# Patient Record
Sex: Male | Born: 1970 | Race: White | Hispanic: No | Marital: Single | Smoking: Current every day smoker
Health system: Southern US, Community
[De-identification: ages and names within clinical notes are randomized; demographics above are authoritative.]

## PROBLEM LIST (undated history)

## (undated) ENCOUNTER — Emergency Department (HOSPITAL_COMMUNITY): Admission: EM | Payer: Medicaid Other | Source: Home / Self Care

## (undated) DIAGNOSIS — E785 Hyperlipidemia, unspecified: Secondary | ICD-10-CM

## (undated) DIAGNOSIS — J42 Unspecified chronic bronchitis: Secondary | ICD-10-CM

## (undated) DIAGNOSIS — J449 Chronic obstructive pulmonary disease, unspecified: Secondary | ICD-10-CM

## (undated) DIAGNOSIS — G43909 Migraine, unspecified, not intractable, without status migrainosus: Secondary | ICD-10-CM

## (undated) DIAGNOSIS — G4730 Sleep apnea, unspecified: Secondary | ICD-10-CM

## (undated) DIAGNOSIS — F419 Anxiety disorder, unspecified: Secondary | ICD-10-CM

## (undated) DIAGNOSIS — M1990 Unspecified osteoarthritis, unspecified site: Secondary | ICD-10-CM

## (undated) DIAGNOSIS — Z91199 Patient's noncompliance with other medical treatment and regimen due to unspecified reason: Secondary | ICD-10-CM

## (undated) DIAGNOSIS — Z8739 Personal history of other diseases of the musculoskeletal system and connective tissue: Secondary | ICD-10-CM

## (undated) DIAGNOSIS — Z9119 Patient's noncompliance with other medical treatment and regimen: Secondary | ICD-10-CM

## (undated) DIAGNOSIS — E119 Type 2 diabetes mellitus without complications: Secondary | ICD-10-CM

## (undated) DIAGNOSIS — G8929 Other chronic pain: Secondary | ICD-10-CM

## (undated) DIAGNOSIS — H9313 Tinnitus, bilateral: Secondary | ICD-10-CM

## (undated) DIAGNOSIS — Z9889 Other specified postprocedural states: Secondary | ICD-10-CM

## (undated) DIAGNOSIS — J45909 Unspecified asthma, uncomplicated: Secondary | ICD-10-CM

## (undated) DIAGNOSIS — E1142 Type 2 diabetes mellitus with diabetic polyneuropathy: Secondary | ICD-10-CM

## (undated) DIAGNOSIS — I10 Essential (primary) hypertension: Secondary | ICD-10-CM

## (undated) DIAGNOSIS — F32A Depression, unspecified: Secondary | ICD-10-CM

## (undated) DIAGNOSIS — R112 Nausea with vomiting, unspecified: Secondary | ICD-10-CM

## (undated) DIAGNOSIS — M549 Dorsalgia, unspecified: Secondary | ICD-10-CM

## (undated) DIAGNOSIS — I739 Peripheral vascular disease, unspecified: Secondary | ICD-10-CM

## (undated) DIAGNOSIS — I2510 Atherosclerotic heart disease of native coronary artery without angina pectoris: Secondary | ICD-10-CM

## (undated) DIAGNOSIS — F329 Major depressive disorder, single episode, unspecified: Secondary | ICD-10-CM

## (undated) DIAGNOSIS — K219 Gastro-esophageal reflux disease without esophagitis: Secondary | ICD-10-CM

## (undated) HISTORY — DX: Gastro-esophageal reflux disease without esophagitis: K21.9

## (undated) HISTORY — DX: Type 2 diabetes mellitus without complications: E11.9

## (undated) HISTORY — DX: Hyperlipidemia, unspecified: E78.5

## (undated) HISTORY — DX: Major depressive disorder, single episode, unspecified: F32.9

## (undated) HISTORY — PX: COLONOSCOPY WITH ESOPHAGOGASTRODUODENOSCOPY (EGD): SHX5779

## (undated) HISTORY — DX: Peripheral vascular disease, unspecified: I73.9

## (undated) HISTORY — DX: Depression, unspecified: F32.A

## (undated) HISTORY — PX: TENDON REPAIR: SHX5111

## (undated) HISTORY — DX: Anxiety disorder, unspecified: F41.9

## (undated) HISTORY — DX: Essential (primary) hypertension: I10

## (undated) HISTORY — DX: Sleep apnea, unspecified: G47.30

## (undated) HISTORY — PX: FRACTURE SURGERY: SHX138

## (undated) HISTORY — PX: CARPAL TUNNEL RELEASE: SHX101

## (undated) HISTORY — PX: ELBOW FRACTURE SURGERY: SHX616

## (undated) HISTORY — DX: Atherosclerotic heart disease of native coronary artery without angina pectoris: I25.10

---
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
## 2001-07-14 ENCOUNTER — Inpatient Hospital Stay (HOSPITAL_COMMUNITY): Admission: EM | Admit: 2001-07-14 | Discharge: 2001-07-17 | Payer: Self-pay | Admitting: Orthopedic Surgery

## 2004-03-26 ENCOUNTER — Encounter: Admission: RE | Admit: 2004-03-26 | Discharge: 2004-03-26 | Payer: Self-pay | Admitting: Family Medicine

## 2004-05-21 ENCOUNTER — Encounter: Admission: RE | Admit: 2004-05-21 | Discharge: 2004-05-21 | Payer: Self-pay | Admitting: Family Medicine

## 2006-08-02 ENCOUNTER — Emergency Department (HOSPITAL_COMMUNITY): Admission: EM | Admit: 2006-08-02 | Discharge: 2006-08-02 | Payer: Self-pay | Admitting: Family Medicine

## 2014-05-31 DIAGNOSIS — G4730 Sleep apnea, unspecified: Secondary | ICD-10-CM

## 2014-05-31 HISTORY — DX: Sleep apnea, unspecified: G47.30

## 2014-07-29 ENCOUNTER — Other Ambulatory Visit: Payer: Self-pay | Admitting: Cardiology

## 2014-07-29 ENCOUNTER — Ambulatory Visit
Admission: RE | Admit: 2014-07-29 | Discharge: 2014-07-29 | Disposition: A | Discharge status: Home / Self Care | Payer: 59 | Source: Ambulatory Visit | Attending: Cardiology | Admitting: Cardiology

## 2014-07-29 DIAGNOSIS — R0602 Shortness of breath: Secondary | ICD-10-CM

## 2014-08-27 ENCOUNTER — Encounter: Payer: Self-pay | Admitting: Neurology

## 2014-09-03 ENCOUNTER — Encounter: Payer: Self-pay | Admitting: Neurology

## 2014-09-03 ENCOUNTER — Ambulatory Visit (INDEPENDENT_AMBULATORY_CARE_PROVIDER_SITE_OTHER): Payer: 59 | Admitting: Neurology

## 2014-09-03 VITALS — BP 115/77 | HR 83 | Temp 97.9°F | Resp 16 | Ht 68.0 in | Wt 183.0 lb

## 2014-09-03 DIAGNOSIS — R0683 Snoring: Secondary | ICD-10-CM | POA: Diagnosis not present

## 2014-09-03 DIAGNOSIS — G2581 Restless legs syndrome: Secondary | ICD-10-CM

## 2014-09-03 DIAGNOSIS — Z720 Tobacco use: Secondary | ICD-10-CM | POA: Diagnosis not present

## 2014-09-03 DIAGNOSIS — R51 Headache: Secondary | ICD-10-CM

## 2014-09-03 DIAGNOSIS — G478 Other sleep disorders: Secondary | ICD-10-CM

## 2014-09-03 DIAGNOSIS — R519 Headache, unspecified: Secondary | ICD-10-CM

## 2014-09-03 DIAGNOSIS — R351 Nocturia: Secondary | ICD-10-CM | POA: Diagnosis not present

## 2014-09-03 DIAGNOSIS — E0842 Diabetes mellitus due to underlying condition with diabetic polyneuropathy: Secondary | ICD-10-CM | POA: Diagnosis not present

## 2014-09-03 DIAGNOSIS — F515 Nightmare disorder: Secondary | ICD-10-CM

## 2014-09-03 DIAGNOSIS — F17200 Nicotine dependence, unspecified, uncomplicated: Secondary | ICD-10-CM

## 2014-09-03 DIAGNOSIS — G4734 Idiopathic sleep related nonobstructive alveolar hypoventilation: Secondary | ICD-10-CM

## 2014-09-03 DIAGNOSIS — F513 Sleepwalking [somnambulism]: Secondary | ICD-10-CM

## 2014-09-03 NOTE — Patient Instructions (Addendum)
Based on your symptoms and your exam I believe you are at risk for obstructive sleep apnea or OSA, and I think we should proceed with a sleep study to determine whether you do or do not have OSA and how severe it is. If you have more than mild OSA, I want you to consider treatment with CPAP. Please remember, the risks and ramifications of moderate to severe obstructive sleep apnea or OSA are: Cardiovascular disease, including congestive heart failure, stroke, difficult to control hypertension, arrhythmias, and even type 2 diabetes has been linked to untreated OSA. Sleep apnea causes disruption of sleep and sleep deprivation in most cases, which, in turn, can cause recurrent headaches, problems with memory, mood, concentration, focus, and vigilance. Most people with untreated sleep apnea report excessive daytime sleepiness, which can affect their ability to drive. Please do not drive if you feel sleepy.  I will see you back after your sleep study to go over the test results and where to go from there. We will call you after your sleep study and to set up an appointment at the time.   Please remember to try to maintain good sleep hygiene, which means: Keep a regular sleep and wake schedule, try not to exercise or have a meal within 2 hours of your bedtime, try to keep your bedroom conducive for sleep, that is, cool and dark, without light distractors such as an illuminated alarm clock, and refrain from watching TV right before sleep or in the middle of the night and do not keep the TV or radio on during the night. Also, try not to use or play on electronic devices at bedtime, such as your cell phone, tablet PC or laptop. If you like to read at bedtime on an electronic device, try to dim the background light as much as possible. Do not eat in the middle of the night.     

## 2014-09-03 NOTE — Progress Notes (Addendum)
Subjective:    Patient ID: [name] [name] is a 44 y.o. male.  HPI     [name] Foley, MD, PhD [facility] [address], [address] [address] [place], [place] [address]  Dear [name] [name],   I saw your patient, [name] [name], upon your kind request in my neurologic clinic today for initial consultation of his sleep disorder, in particular, concern for underlying obstructive sleep apnea. As you know, [name] [name] is a 45 year old right-handed gentleman with an underlying medical history of hypertension, smoking, hyperlipidemia, poorly controlled type 2 diabetes, depression and atypical chest pain, as well as overweight state, who reports snoring and excessive daytime somnolence. She had a pulse oximetry test overnight on 07/11/2014 which I reviewed: Average oxygen saturation was 92.5%, lowest was 81%, time below 88% saturation was 6.1 minutes. Time below 89% saturation was 15.4 minutes. Total test time was 10 hours and 43 minutes.  I reviewed his recent blood work and test results from your office: on 07/05/14: LDL was elevated at 150, total cholesterol was 220.  He had a Lexiscan stress test on 07/12/14, which I reviewed: NSR, RBBB, negative for ischemia.  SPECT imaging showed normal EF. HbA1c on 06/05/14: 14.3%.  Echocardiogram on 07/09/14: LV normal in size, mild concentric LVH, normal global wall motion. Calculated EF: 58%. Trace MR, trace TR.   His typical bedtime is reported to be around 10 PM and usual wake time is around 5:30 AM. Sleep onset typically occurs within 30-40 minutes. He reports feeling poorly rested upon awakening. He wakes up on an average 3 to 4 times in the middle of the night and has to go to the bathroom 1 times on a typical night. He reports occasional morning headaches.  He reports excessive daytime somnolence (EDS) and His Epworth Sleepiness Score (ESS) is 5/24 today. He has not fallen asleep while driving. The patient has not been taking a planned nap.  He has  been known to snore for the past few years. Snoring is reportedly mild to moderate, and it is unclear if it is associated with choking sounds or witnessed apneas. He is single, lives with his mother and sleeps alone, except for the dog in the bed. The patient admits to a sense of choking or strangling feeling occasionally. There some report of nighttime reflux, with no nighttime cough experienced. The patient has noted some RLS symptoms and is a restless sleeper, admits to tossing and turning, having nightmares and occasional sleep walking and sleep talking. There is no family history of RLS but sister had OSA on CPAP and died at age 51 from CHF.  He denies cataplexy, sleep paralysis, hypnagogic or hypnopompic hallucinations, or sleep attacks. He does report vivid dreams, nightmares, no dream enactments, but has occasional sleep talking and sleep walking. The patient has not had a sleep study or a home sleep test.  He consumes 4 caffeinated beverages per day, usually in the form of soda: [facility].   His bedroom is usually dark and cool. There is a TV in the bedroom and usually it is not on at night.   His Past Medical History Is Significant For: Past Medical History  Diagnosis Date  . Malaise and fatigue   . Hypertension   . Atypical chest pain   . Hyperlipemia   . Diabetes mellitus without complication     His Past Surgical History Is Significant For: Past Surgical History  Procedure Laterality Date  . Appendectomy  1975  . Ankle surgery  1982    His Family History Is Significant For: Family History  Problem Relation Age of Onset  . Congestive Heart Failure Sister     His Social History Is Significant For: History   Social History  . Marital Status: Single    Spouse Name: N/A  . Number of Children: 0  . Years of Education: 10th g   Occupational History  . [employer]    Social History Main Topics  . Smoking status: Current Every Day Smoker -- 1.00 packs/day for  29 years    Types: Cigarettes  . Smokeless tobacco: Not on file  . Alcohol Use: No  . Drug Use: No  . Sexual Activity: Not on file   Other Topics Concern  . None   Social History Narrative    His Allergies Are:  No Known Allergies:   His Current Medications Are:  Outpatient Encounter Prescriptions as of 09/03/2014  Medication Sig  . acetaminophen (TYLENOL) 500 MG tablet Take 500 mg by mouth every 6 (six) hours as needed.  Marland Kitchen. aspirin 81 MG chewable tablet Chew by mouth daily.  Marland Kitchen. atorvastatin (LIPITOR) 40 MG tablet   . insulin detemir (LEVEMIR) 100 UNIT/ML injection Inject into the skin at bedtime.  . metFORMIN (GLUCOPHAGE) 500 MG tablet Take 500 mg by mouth 2 (two) times daily with a meal.  . metoprolol succinate (TOPROL-XL) 25 MG 24 hr tablet Take 25 mg by mouth daily.  . sertraline (ZOLOFT) 50 MG tablet Take 50 mg by mouth at bedtime.  . valsartan-hydrochlorothiazide (DIOVAN-HCT) 160-12.5 MG per tablet Take 1 tablet by mouth daily.  . [DISCONTINUED] atorvastatin (LIPITOR) 10 MG tablet Take 10 mg by mouth daily.  :  Review of Systems:  Out of a complete 14 point review of systems, all are reviewed and negative with the exception of these symptoms as listed below:   Review of Systems  Neurological:       Wakes up many times during night, Nightmares, Sometimes wakes up tired, does not take naps during day, Denies waking up gasping for air during night.      Objective:  Neurologic Exam  Physical Exam Physical Examination:   Filed Vitals:   09/03/14 0904  BP: 115/77  Pulse: 83  Temp: 97.9 F (36.6 C)  Resp: 16    General Examination: The patient is a very pleasant 44 y.o. male in no acute distress. He appears well-developed and well-nourished and adequately groomed.   HEENT: Normocephalic, atraumatic, pupils are equal, round and reactive to light and accommodation. Funduscopic exam is normal with sharp disc margins noted. Extraocular tracking is good without limitation  to gaze excursion or nystagmus noted. Normal smooth pursuit is noted. Hearing is grossly intact. Tympanic membranes are clear bilaterally. Face is symmetric with normal facial animation and normal facial sensation. Speech is clear with no dysarthria noted. There is no hypophonia. There is no lip, neck/head, jaw or voice tremor. Neck is supple with full range of passive and active motion. There are no carotid bruits on auscultation. Oropharynx exam reveals: moderate mouth dryness, poor dental hygiene and moderate airway crowding, due to smaller airway entry and wider uvula. Mallampati is class II. Tongue protrudes centrally and palate elevates symmetrically. Tonsils are 1+/2+ in size/absent. Neck size is 17.5 inches. He has a Absent overbite. Nasal inspection reveals no significant nasal mucosal bogginess or redness and no septal deviation.   Chest: Clear to auscultation without wheezing, rhonchi or crackles noted.  Heart: S1+S2+0, regular and  normal without murmurs, rubs or gallops noted.   Abdomen: Soft, non-tender and non-distended with normal bowel sounds appreciated on auscultation.  Extremities: There is no pitting edema in the distal lower extremities bilaterally. Pedal pulses are intact.  Skin: Warm and dry without trophic changes noted. There are no varicose veins.  Musculoskeletal: exam reveals no obvious joint deformities, tenderness or joint swelling or erythema.   Neurologically:  Mental status: The patient is awake, alert and oriented in all 4 spheres. His immediate and remote memory, attention, language skills and fund of knowledge are appropriate. There is no evidence of aphasia, agnosia, apraxia or anomia. Speech is clear with normal prosody and enunciation. Thought process is linear. Mood is normal and affect is normal.  Cranial nerves II - XII are as described above under HEENT exam. In addition: shoulder shrug is normal with equal shoulder height noted. Motor exam: Normal bulk,  strength and tone is noted. There is no drift, tremor or rebound. Romberg is negative. Reflexes are 2+ throughout. Babinski: Toes are flexor bilaterally. Fine motor skills and coordination: intact with normal finger taps, normal hand movements, normal rapid alternating patting, normal foot taps and normal foot agility.  Cerebellar testing: No dysmetria or intention tremor on finger to nose testing. Heel to shin is unremarkable bilaterally. There is no truncal or gait ataxia.  Sensory exam: intact to light touch, pinprick, vibration, temperature sense in the upper and decrease to all modalities in the distal lower extremities.  Gait, station and balance: He stands easily. No veering to one side is noted. No leaning to one side is noted. Posture is age-appropriate and stance is narrow based. Gait shows normal stride length and normal pace. No problems turning are noted. He turns en bloc. Tandem walk is unremarkable.    Assessment and Plan:   In summary, [name] [name] is a very pleasant 44 y.o.-year old male with an underlying medical history of hypertension, smoking, hyperlipidemia, poorly controlled type 2 diabetes, depression and atypical chest pain, as well as overweight state, who reports snoring and excessive daytime somnolence, multiple nighttime awakenings and recent evidence of nocturnal hypoxemia his history and physical exam are indeed concerning for underlying obstructive sleep apnea (OSA). I had a long chat with the patient about my findings and the diagnosis of OSA, its prognosis and treatment options. We talked about medical treatments, surgical interventions and non-pharmacological approaches. I explained in particular the risks and ramifications of untreated moderate to severe OSA, especially with respect to developing cardiovascular disease down the Road, including congestive heart failure, difficult to treat hypertension, cardiac arrhythmias, or stroke. Even type 2 diabetes has, in part,  been linked to untreated OSA. Symptoms of untreated OSA include daytime sleepiness, memory problems, mood irritability and mood disorder such as depression and anxiety, lack of energy, as well as recurrent headaches, especially morning headaches. We talked about smoking cessation and trying to maintain a healthy lifestyle in general, as well as the importance of weight control. I encouraged the patient to eat healthy, exercise daily and keep well hydrated, to keep a scheduled bedtime and wake time routine, to not skip any meals and eat healthy snacks in between meals. I advised the patient not to drive when feeling sleepy. I recommended the following at this time: sleep study with potential positive airway pressure titration. (We will score hypopneas at 4% and split the sleep study into diagnostic and treatment portion, if the estimated. 2 hour AHI is >20/h).   On exam, he has  evidence of diabetic neuropathy and we talked about the importance of good glucose control. In addition, he reports some RLS symptoms and is a restless sleeper and we will be on the look out for PLMS.  I explained the sleep test procedure to the patient and also outlined possible surgical and non-surgical treatment options of OSA, including the use of a custom-made dental device (which would require a referral to a specialist dentist or oral surgeon), upper airway surgical options, such as pillar implants, radiofrequency surgery, tongue base surgery, and UPPP (which would involve a referral to an ENT surgeon). Rarely, jaw surgery such as mandibular advancement may be considered.  I also explained the CPAP treatment option to the patient, who indicated that he would be willing to try CPAP if the need arises. I explained the importance of being compliant with PAP treatment, not only for insurance purposes but primarily to improve His symptoms, and for the patient's long term health benefit, including to reduce His cardiovascular risks. I  answered all his questions today and the patient was in agreement. I would like to see him back after the sleep study is completed and encouraged him to call with any interim questions, concerns, problems or updates.   Thank you very much for allowing me to participate in the care of this nice patient. If I can be of any further assistance to you please do not hesitate to call me at [phone].  Sincerely,   [name] Foley, MD, PhD

## 2014-09-10 ENCOUNTER — Ambulatory Visit (INDEPENDENT_AMBULATORY_CARE_PROVIDER_SITE_OTHER): Payer: 59 | Admitting: Neurology

## 2014-09-10 DIAGNOSIS — G4761 Periodic limb movement disorder: Secondary | ICD-10-CM

## 2014-09-10 DIAGNOSIS — G4734 Idiopathic sleep related nonobstructive alveolar hypoventilation: Secondary | ICD-10-CM

## 2014-09-10 DIAGNOSIS — G4720 Circadian rhythm sleep disorder, unspecified type: Secondary | ICD-10-CM

## 2014-09-10 DIAGNOSIS — G479 Sleep disorder, unspecified: Secondary | ICD-10-CM

## 2014-09-10 DIAGNOSIS — G4733 Obstructive sleep apnea (adult) (pediatric): Secondary | ICD-10-CM

## 2014-09-10 NOTE — Sleep Study (Signed)
Please see the scanned sleep study interpretation located in the Procedure tab within the Chart Review section. 

## 2014-09-20 ENCOUNTER — Telehealth: Payer: Self-pay | Admitting: Neurology

## 2014-09-20 DIAGNOSIS — G4733 Obstructive sleep apnea (adult) (pediatric): Secondary | ICD-10-CM

## 2014-09-20 NOTE — Telephone Encounter (Signed)
Please call and notify the patient that the recent sleep study did confirm the diagnosis of obstructive sleep apnea and that I recommend treatment for this in the form of CPAP. This will require a repeat sleep study for proper titration and mask fitting and monitoring oxygen levels etc. Please explain to patient and arrange for a CPAP titration study. I have placed an order in the chart. If patient agreeable, pls route to Darrol Jumpavid and Alycia for scheduling/auth process, etc. thx Onalee Huaavid or Leanord Hawkinglycia can close the encounter.   Huston FoleySaima Kumiko Fishman, MD, PhD [facility])

## 2014-09-23 ENCOUNTER — Encounter: Payer: Self-pay | Admitting: Neurology

## 2014-09-24 NOTE — Telephone Encounter (Signed)
Left message on home phone to call back for sleep study results. Work number is to a call center, unable to speak with patient.

## 2014-09-25 NOTE — Telephone Encounter (Signed)
Left message to call back  

## 2014-09-25 NOTE — Telephone Encounter (Signed)
Pt is returning Diana's call. Please call [phone].

## 2014-09-30 ENCOUNTER — Ambulatory Visit (INDEPENDENT_AMBULATORY_CARE_PROVIDER_SITE_OTHER): Payer: 59 | Admitting: Neurology

## 2014-09-30 VITALS — BP 161/99 | HR 92 | Ht 68.0 in | Wt 183.0 lb

## 2014-09-30 DIAGNOSIS — G4720 Circadian rhythm sleep disorder, unspecified type: Secondary | ICD-10-CM

## 2014-09-30 DIAGNOSIS — G4733 Obstructive sleep apnea (adult) (pediatric): Secondary | ICD-10-CM | POA: Diagnosis not present

## 2014-09-30 DIAGNOSIS — G4761 Periodic limb movement disorder: Secondary | ICD-10-CM

## 2014-09-30 DIAGNOSIS — G479 Sleep disorder, unspecified: Secondary | ICD-10-CM

## 2014-09-30 NOTE — Sleep Study (Signed)
See scanned documents in Encounters tab 

## 2014-10-07 NOTE — Telephone Encounter (Signed)
Left message to call back for test results.

## 2014-10-08 NOTE — Telephone Encounter (Signed)
Patient is aware of results below and has had the CPAP titration study. He would like to proceed with getting CPAP at home. Please send this back to me when orders from titration study have been placed. I [facility] then [facility] [place] [facility] [place]) and study to referring doctor.

## 2014-10-14 ENCOUNTER — Telehealth: Payer: Self-pay | Admitting: Neurology

## 2014-10-14 DIAGNOSIS — G4733 Obstructive sleep apnea (adult) (pediatric): Secondary | ICD-10-CM

## 2014-10-14 NOTE — Telephone Encounter (Signed)
Lafonda MossesDiana:  Please call and inform patient that I have entered an order for treatment with PAP. He did well during the latest sleep study with CPAP. We will, therefore, arrange for a machine for home use through a DME (durable medical equipment) company of His choice (Dr. Jacinto HalimGanji prefers his patient to go to [facility], if possible, ins: [employer]).  I will see the patient back in follow-up in about 6 weeks. Please also explain to the patient that I will be looking out for compliance data downloaded from the machine, which can be done remotely through a modem at times or stored on an SD card in the back of the machine. At the time of the followup appointment we will discuss sleep study results and how it is going with PAP treatment at home. Please advise patient to bring His machine at the time of the visit; at least for the first visit, even though this is cumbersome. Bringing the machine for every visit after that may not be needed, but often helps for the first visit. Please also make sure, the patient has a follow-up appointment with me in about 8-10 weeks from the setup date, thanks.   Huston FoleySaima Tephanie Escorcia, MD, PhD [facility]

## 2014-10-15 ENCOUNTER — Telehealth: Payer: Self-pay | Admitting: Neurology

## 2014-10-15 NOTE — Telephone Encounter (Signed)
Mandy with [facility] is calling to let you know she did get the order for the cpap for this patient and is processing.

## 2014-10-15 NOTE — Telephone Encounter (Signed)
I left a message for [facility] to call back regarding CPAP referral. I also left message on patient's home phone letting him know that we sent referral in for him. I also reminded him that he needs to call us for appt. I will send reminder letter to him today.

## 2014-10-24 ENCOUNTER — Telehealth: Payer: Self-pay | Admitting: Neurology

## 2014-10-24 NOTE — Telephone Encounter (Signed)
Please call patient and offer him a follow-up appointment in sleep clinic to discuss sleep apnea treatment. As I understand he declined CPAP therapy.

## 2014-10-24 NOTE — Telephone Encounter (Signed)
Left message for patient to call back and schedule f/u visit with Dr. Frances FurbishAthar to discuss PSG and treatment options.

## 2015-02-27 DIAGNOSIS — F17200 Nicotine dependence, unspecified, uncomplicated: Secondary | ICD-10-CM | POA: Insufficient documentation

## 2015-02-27 DIAGNOSIS — G4733 Obstructive sleep apnea (adult) (pediatric): Secondary | ICD-10-CM | POA: Insufficient documentation

## 2015-02-27 DIAGNOSIS — E669 Obesity, unspecified: Secondary | ICD-10-CM | POA: Insufficient documentation

## 2015-02-27 DIAGNOSIS — F419 Anxiety disorder, unspecified: Secondary | ICD-10-CM

## 2015-03-27 DIAGNOSIS — R079 Chest pain, unspecified: Secondary | ICD-10-CM

## 2015-03-27 NOTE — H&P (Signed)
OFFICE VISIT NOTES COPIED TO EPIC FOR DOCUMENTATION  [name] [name] 03/07/2015 1:45 PM Location: [facility] Patient #: [id number] DOB: [birth year] Single / Language: [place] / Race: White Male   History of Present Illness [name]; 03/08/2015 12:09 PM) Patient words: 6 week F/U for tachycardia.  The patient is a 44 year old male who presents for a follow-up for Chest pain. Patient being seen for follow-up and management of chest pain and hyperlipidemia. He has hypertension, uncontrolled hyperlipidemia and uncontrolled diabetes mellitus and tobacco use disorder. He has occasional chest tightness with exertion activity but also states that he has chest pain when he gets upset. He has not used any sublingual nitroglycerin. Burning sensation in the chest on exertion with radiation to his neck had improved since being on medical therapy, however patient states that over the past few weeks his symptoms have gotten worse, and he is now being referred for GI evaluation. I'm seeing him frequently to improve compliance.  He is tolerating Lipitor and also valsartan, since then his blood pressure has been well-controlled. Pt smokes 1 pack a day with a 29 year pack history. No claudication although has leg cramps at night. He denies symptoms suggestive of TIA, dizziness or syncope, no leg edema.   Problem List/Past Medical [name]; 03/07/2015 1:45 PM) Angina pectoris (I20.9) Lexiscan sestamibi stress test 07/12/2014: 1. The resting electrocardiogram demonstrated normal sinus rhythm, RBBB and no resting arrhythmias. Stress EKG is nondiagnostic for ischemia as it is a Pharmacologic stress test using Lexiscan infusion. Stress symptoms included dyspnea, dizziness. 2. Gated SPECT imaging reveals normal myocardial thickening and wall motion. The left ventricular ejection fraction was normal visually, but calculated by QGS to be(41%). The right ventricle is abnormal and appears  dilated. This is a low risk stress test. Uncontrolled type 2 diabetes mellitus without complication, with long-term current use of insulin (E11.65) Labs 06/05/2014: HbA1c 14.3%, serum glucose 423, creatinine 0.95, CMP otherwise normal, CBC normal ABI 07/18/2014: This exam reveals normal perfusion of the right lower extremity. This exam reveals normal perfusion of the left lower extremity. Normal bilateral ABI with triphasic waveforms at the level of the ankles. Right ABI 1.01, left ABI 0.97 Benign essential hypertension (I10) Malaise and fatigue (R53.81, R53.83) Sleep study 09/10/2014: Mild to moderate obstructive sleep apnea. Schedule for repeat sleep study with PAP titration. Office visit 09/03/2014 Frances Furbish, M.D.) schedule for sleep study with potential positive airway pressure titration Nocturnal oximetry 07/11/2014: Time < 88%: 6.1 minutes, time < 89%: 15.4 minutes, low SpO2 81%, total desaturation events: 226, average events per hour: 20. This patient qualifies for nocturnal oxygen per Medicare guidelines Group I. 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) Labs 01/22/2015: Total cholesterol 163, triglycerides 254, HDL 29, LDL 83, LDL particle #1304, LP-IR score 69, serum glucose 333, creatinine 0.93, potassium 4.7, CMP otherwise normal Labs 10/23/2014: Total cholesterol 161, triglycerides 260, HDL 33, LDL 76, LDL particle # 1335, LP-IR score 77, serum glucose 424, creatinine 1.32, sodium 130, potassium 5.3, chloride 90, CMP otherwise normal Labs(VAP panel) 07/05/2014: Total cholesterol 220, triglycerides 183, HDL 38, LDL (direct) 150, Lp(a) 0.0, LDL pattern B (small, dense LDL) Shortness of breath on exertion (R06.02) Echo- 07/09/14: Left ventricle cavity is normal in size. Mild concentric hypertrophy of the left ventricle. Normal global wall motion. Calculated EF 58%. Trace mitral regurgitation. Trace tricuspid regurgitation. Chest x-ray 07/29/2014:  Normal chest x-ray PA and lateral view. Gastroesophageal reflux disease without  esophagitis (K21.9)  Allergies (Charavina Reader; 03/07/2015 1:52 PM) No Known Drug Allergies10/11/2014 (Marked as Inactive) Esomeprazole Magnesium *ULCER DRUGS* Swelling.  Family History [name]; 03/07/2015 1:45 PM) Mother Living; no known heart conditions Father Living; no known heart conditions Sister 1 Deceased. at age 20 from CHF Brother 2 1-1 Yr Older; 1-4 Yrs Older; no known heart conditions  Social History (Charavina Reader; 03/07/2015 1:45 PM) Current tobacco use Current every day smoker. 1 PPD since he was 14. 29 pack year history. (down to 1/2 - 1 PPD) Marital status Single. Non Drinker/No Alcohol Use Number of Children 0. Living Situation Lives with parents.  Past Surgical History [name]; 03/07/2015 1:45 PM) Appendectomy1975 Ankle [phone] Right. 2010 Left Ankle Carpal Tunnel Surgery - Left2010  Medication History (Charavina Reader; 03/07/2015 1:56 PM) Glimepiride (  Tablet, 1 (one) Tablet Oral every morning, Taken starting 01/24/2015) Active. Metoprolol Succinate ER (  Tablet ER 24HR, 1 Tablet ER 24HR Oral daily, Taken starting 01/24/2015) Active. (Changed to 50 mg daily) Valsartan (  Tablet, 1 (one) Tablet Oral daily, Taken starting 01/24/2015) Active. (Discontinue Valsartan HCT) Atorvastatin Calcium (  Tablet, 1 (one) Tablet Tablet Tablet Oral daily after a meal, Taken starting 10/29/2014) Active. Nitrostat (0.4MG  Tab Sublingual, 1 (one) Tab Sublingual Tab Sub Sublingual every 5 minutes as needed for chest pain., Taken starting 10/29/2014) Active. Aspirin Adult Low Strength (  Tablet Chewable, 1 (one) Tablet Chewable Tablet Oral daily, Taken starting 07/05/2014) Active. MetFORMIN HCl (  Tablet, 1 Oral two times daily) Active. Sertraline HCl (  Tablet, 1 Oral at bedtime) Active. Levemir FlexTouch (100UNIT/ML Soln Pen-inj, 12  units Subcutaneous daily) Active. Tylenol Extra Strength (  Tablet, 1 Oral as needed) Active. ALPRAZolam (0.5MG  Tablet, 1/2 to 1 tablet Oral daily) Active. Glyxambi (10-5MG  Tablet, 1 Oral at bedtime) Active. Medications Reconciled  Diagnostic Studies History [name]; 03/07/2015 1:45 PM) Echocardiogram02/01/2015 1. Left ventricle cavity is normal in size. Mild concentric hypertrophy of the left ventricle. Normal global wall motion. Calculated EF 58%. 2. Trace mitral regurgitation. 3. Trace tricuspid regurgitation. Nuclear stress test02/05/2015 1. The resting electrocardiogram demonstrated normal sinus rhythm, RBBB and no resting arrhythmias. Stress EKG is nondiagnostic for ischemia as it is a Pharmacologic stress test using Lexiscan infusion. Stress symptoms included dyspnea, dizziness. 2. Gated SPECT imaging reveals normal myocardial thickening and wall motion. The left ventricular ejection fraction was normal visually, but calculated by QGS to be(41%). The right ventricle is abnormal and appears dilated. This is a low risk stress test. ABI's02/18/2016 This exam reveals normal perfusion of the right lower extremity. This exam reveals normal perfusion of the left lower extremity. Normal bilateral ABI with triphasic waveforms at the level of the ankles. Right ABI 1.01, left ABI 0.97 Chest X-ray02/29/2016 Sleep Study04/05/2015 Mild to moderate obstructive sleep apnea. Schedule for repeat sleep study with PAP titration. 07/02/2014    Review of Systems [name]; 03/08/2015 12:09 PM) General Present- Fatigue and Feeling well. Not Present- Fever and Night Sweats. Skin Not Present- Itching and Rash. HEENT Not Present- Headache. Respiratory Present- Decreased Exercise Tolerance and Difficulty Breathing on Exertion. Not Present- Wakes up from Sleep Wheezing or Short of Breath and Wheezing. Cardiovascular Present- Calf Cramps (at night), Chest Pain and Difficulty Breathing  On Exertion (chronic and stable). Not Present- Fainting, Orthopnea, Palpitations and Swelling of Extremities. Gastrointestinal Present- Gas. Not Present- Abdominal Pain, Constipation, Diarrhea, Nausea and Vomiting. Musculoskeletal Not Present- Joint Swelling. Neurological Not Present- Headaches. Hematology Not Present- Blood Clots, Easy Bruising and Nose Bleed.  Vitals [facility] Reader;  03/07/2015 1:57 PM) 03/07/2015 1:51 PM Weight: 195 lb Height: 68in Body Surface Area: 2.02 m Body Mass Index: 29.65 kg/m  Pulse: 90 (Regular)  P.OX: 98% (Room air) BP: 126/86 (Sitting, Left Arm, Standard)       Physical Exam Pamella Pert(Jagadeesh R Arelys Glassco, MD; 03/08/2015 12:10 PM) General Mental Status-Alert. General Appearance-Cooperative, Appears older than stated age, Not in acute distress. Orientation-Oriented X3. Build & Nutrition-Well nourished and Moderately built.  Head and Neck Thyroid Gland Characteristics - no palpable nodules, no palpable enlargement.  Chest and Lung Exam Palpation Tender - No chest wall tenderness. Auscultation Breath sounds - Clear.  Cardiovascular Inspection Jugular vein - Right - No Distention. Auscultation Heart Sounds - S1 WNL, S2 WNL and No gallop present. Murmurs & Other Heart Sounds - Murmur - No murmur.  Abdomen Palpation/Percussion Normal exam - Non Tender and No hepatosplenomegaly. Auscultation Normal exam - Bowel sounds normal.  Peripheral Vascular Lower Extremity Inspection - Left - No Pigmentation, No Varicose veins. Right - No Pigmentation, No Varicose veins. Palpation - Edema - Left - No edema. Right - No edema. Femoral pulse - Bilateral - Normal. Popliteal pulse - Bilateral - Normal. Dorsalis pedis pulse - Bilateral - 1+. Posterior tibial pulse - Bilateral - Absent. Carotid arteries - Bilateral-No Carotid bruit. Abdomen-No prominent abdominal aortic pulsation, No epigastric bruit.  Neurologic Motor-Grossly intact without  any focal deficits.  Musculoskeletal Global Assessment Left Lower Extremity - normal range of motion without pain. Right Lower Extremity - normal range of motion without pain.    Assessment & Plan Pamella Pert(Jagadeesh R. Shemia Bevel MD; 03/08/2015 12:10 PM) Angina pectoris (I20.9) Story: Lexiscan sestamibi stress test 07/12/2014: 1. The resting electrocardiogram demonstrated normal sinus rhythm, RBBB and no resting arrhythmias. Stress EKG is nondiagnostic for ischemia as it is a Pharmacologic stress test using Lexiscan infusion. Stress symptoms included dyspnea, dizziness. 2. Gated SPECT imaging reveals normal myocardial thickening and wall motion. The left ventricular ejection fraction was normal visually, but calculated by QGS to be(41%). The right ventricle is abnormal and appears dilated. This is a low risk stress test. Impression: EKG 03/17/2015: Normal sinus rhythm at the rate of 87 bpm, normal axis. Right bundle branch block. No evidence of ischemia. No significant change from EKG 07/05/2014. Current Plans Complete electrocardiogram (93000) Future Plans 03/20/2015: METABOLIC PANEL, BASIC [phone]([id number]) - one time 03/20/2015: CBC & PLATELETS (AUTO) ([id number]) - one time 03/20/2015: PT (PROTHROMBIN TIME) ([id number]) - one time Shortness of breath on exertion (R06.02) Story: Echo- 07/09/14: Left ventricle cavity is normal in size. Mild concentric hypertrophy of the left ventricle. Normal global wall motion. Calculated EF 58%. Trace mitral regurgitation. Trace tricuspid regurgitation.  Chest x-ray 07/29/2014: Normal chest x-ray PA and lateral view. Gastroesophageal reflux disease without esophagitis (K21.9) Uncontrolled type 2 diabetes mellitus without complication, with long-term current use of insulin (E11.65) Story: Labs 06/05/2014: HbA1c 14.3%, serum glucose 423, creatinine 0.95, CMP otherwise normal, CBC normal  ABI 07/18/2014: This exam reveals normal perfusion of the right lower extremity. This exam reveals normal  perfusion of the left lower extremity. Normal bilateral ABI with triphasic waveforms at the level of the ankles. 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) Story: Labs 01/22/2015: Total cholesterol 163, triglycerides 254, HDL 29, LDL 83, LDL particle #1304, LP-IR score 69, serum glucose 333, creatinine 0.93, potassium 4.7, CMP otherwise normal  Labs 10/23/2014: Total cholesterol 161, triglycerides 260, HDL 33, LDL 76, LDL particle # 1335, LP-IR score 77, serum glucose 424,  creatinine 1.32, sodium 130, potassium 5.3, chloride 90, CMP otherwise normal  Labs(VAP panel) 07/05/2014: Total cholesterol 220, triglycerides 183, HDL 38, LDL (direct) 150, Lp(a) 0.0, LDL pattern B (small, dense LDL) Benign essential hypertension (I10) Tobacco use disorder, continuous (F17.209) Current Plans Mechanism of underlying disease process and action of medications discussed with the patient. I discussed primary/secondary prevention and also dietary counceling was done. Patient presents here for follow-up and evaluation of chest pain and angina pectoris. Unfortunately still continues to smoke, and he has had several episodes of chest pain in the form of burning sensation in the chest with radiation to his neck. I'm concerned about worsening anginal symptoms, due to persistent symptoms although GI etiology is still a possibility, cannot exclude significant CAD. Hence I recommended sitting with coronary angiography. I have again discussed with him regarding smoking cessation, patient does have nicotine patches at home and encouraged him to use it. He is on maximal medical therapy, unable to add long-acting nitrates or calcium channel blocker due to borderline low blood pressure.  Schedule for cardiac catheterization, and possible angioplasty. We discussed regarding risks, benefits, alternatives to this including stress testing, CTA and continued medical therapy. Patient wants to  proceed. Understands <1-2% risk of death, stroke, MI, urgent CABG, bleeding, infection, renal failure but not limited to these. Video recording of the procedure shown to the patient.  Addendum Note(Bridgette Allison AGNP-C; 03/25/2015 12:48 PM) Labs 03/24/2015: serum glucose 177, creatinine 1.12, potassium 4.7, WBC 13.2, CBC otherwise normal, PT 9.9, INR 1.0     Signed by Pamella Pert, MD (03/08/2015 12:11 PM)

## 2015-03-28 ENCOUNTER — Encounter (HOSPITAL_COMMUNITY)
Admission: RE | Disposition: A | Discharge status: Home / Self Care | Payer: Self-pay | Source: Ambulatory Visit | Attending: Cardiology

## 2015-03-28 ENCOUNTER — Ambulatory Visit (HOSPITAL_COMMUNITY)
Admission: RE | Admit: 2015-03-28 | Discharge: 2015-03-28 | Disposition: A | Discharge status: Home / Self Care | Payer: 59 | Source: Ambulatory Visit | Attending: Cardiology | Admitting: Cardiology

## 2015-03-28 ENCOUNTER — Encounter (HOSPITAL_COMMUNITY): Payer: Self-pay | Admitting: Cardiology

## 2015-03-28 DIAGNOSIS — Z7984 Long term (current) use of oral hypoglycemic drugs: Secondary | ICD-10-CM | POA: Insufficient documentation

## 2015-03-28 DIAGNOSIS — R079 Chest pain, unspecified: Secondary | ICD-10-CM | POA: Diagnosis not present

## 2015-03-28 DIAGNOSIS — Z7982 Long term (current) use of aspirin: Secondary | ICD-10-CM | POA: Insufficient documentation

## 2015-03-28 DIAGNOSIS — I4510 Unspecified right bundle-branch block: Secondary | ICD-10-CM | POA: Insufficient documentation

## 2015-03-28 DIAGNOSIS — I2510 Atherosclerotic heart disease of native coronary artery without angina pectoris: Secondary | ICD-10-CM | POA: Diagnosis present

## 2015-03-28 DIAGNOSIS — I25119 Atherosclerotic heart disease of native coronary artery with unspecified angina pectoris: Secondary | ICD-10-CM | POA: Diagnosis not present

## 2015-03-28 DIAGNOSIS — I10 Essential (primary) hypertension: Secondary | ICD-10-CM | POA: Insufficient documentation

## 2015-03-28 DIAGNOSIS — E1165 Type 2 diabetes mellitus with hyperglycemia: Secondary | ICD-10-CM | POA: Insufficient documentation

## 2015-03-28 DIAGNOSIS — F17210 Nicotine dependence, cigarettes, uncomplicated: Secondary | ICD-10-CM | POA: Diagnosis not present

## 2015-03-28 DIAGNOSIS — K219 Gastro-esophageal reflux disease without esophagitis: Secondary | ICD-10-CM | POA: Insufficient documentation

## 2015-03-28 DIAGNOSIS — Z794 Long term (current) use of insulin: Secondary | ICD-10-CM | POA: Insufficient documentation

## 2015-03-28 DIAGNOSIS — R0602 Shortness of breath: Secondary | ICD-10-CM | POA: Diagnosis not present

## 2015-03-28 DIAGNOSIS — G4733 Obstructive sleep apnea (adult) (pediatric): Secondary | ICD-10-CM | POA: Diagnosis not present

## 2015-03-28 DIAGNOSIS — E782 Mixed hyperlipidemia: Secondary | ICD-10-CM | POA: Diagnosis not present

## 2015-03-28 HISTORY — PX: CARDIAC CATHETERIZATION: SHX172

## 2015-03-28 LAB — POCT ACTIVATED CLOTTING TIME: Activated Clotting Time: 245 seconds

## 2015-03-28 LAB — GLUCOSE, CAPILLARY
Glucose-Capillary: 180 mg/dL — ABNORMAL HIGH (ref 65–99)
Glucose-Capillary: 162 mg/dL — ABNORMAL HIGH (ref 65–99)

## 2015-03-28 SURGERY — LEFT HEART CATH AND CORONARY ANGIOGRAPHY
Anesthesia: LOCAL

## 2015-03-28 MED ORDER — HYDROMORPHONE HCL 1 MG/ML IJ SOLN
INTRAMUSCULAR | Status: DC | PRN
  Administered 2015-03-28: 0.5 mg via INTRAVENOUS

## 2015-03-28 MED ORDER — HYDROMORPHONE HCL 1 MG/ML IJ SOLN
INTRAMUSCULAR | Status: AC
  Filled 2015-03-28: qty 1

## 2015-03-28 MED ORDER — ADENOSINE (DIAGNOSTIC) 140MCG/KG/MIN
INTRAVENOUS | Status: DC | PRN
  Administered 2015-03-28: 140 ug/kg/min via INTRAVENOUS

## 2015-03-28 MED ORDER — ADENOSINE 12 MG/4ML IV SOLN
16.0000 mL | Freq: Once | INTRAVENOUS | Status: DC
  Filled 2015-03-28: qty 16

## 2015-03-28 MED ORDER — SODIUM CHLORIDE 0.9 % IJ SOLN: 3.0000 mL | INTRAMUSCULAR | Status: DC | PRN

## 2015-03-28 MED ORDER — SODIUM CHLORIDE 0.9 % WEIGHT BASED INFUSION
3.0000 mL/kg/h | INTRAVENOUS | Status: DC
  Administered 2015-03-28: 3 mL/kg/h via INTRAVENOUS

## 2015-03-28 MED ORDER — NITROGLYCERIN 1 MG/10 ML FOR IR/CATH LAB
INTRA_ARTERIAL | Status: DC | PRN
  Administered 2015-03-28: 08:00:00

## 2015-03-28 MED ORDER — HEPARIN SODIUM (PORCINE) 1000 UNIT/ML IJ SOLN
INTRAMUSCULAR | Status: DC | PRN
  Administered 2015-03-28: 6000 [IU] via INTRAVENOUS
  Administered 2015-03-28: 3000 [IU] via INTRAVENOUS

## 2015-03-28 MED ORDER — MIDAZOLAM HCL 2 MG/2ML IJ SOLN
INTRAMUSCULAR | Status: DC | PRN
  Administered 2015-03-28: 2 mg via INTRAVENOUS

## 2015-03-28 MED ORDER — SODIUM CHLORIDE 0.9 % IJ SOLN: 3.0000 mL | Freq: Two times a day (BID) | INTRAMUSCULAR | Status: DC

## 2015-03-28 MED ORDER — SODIUM CHLORIDE 0.9 % WEIGHT BASED INFUSION: 1.0000 mL/kg/h | INTRAVENOUS | Status: DC

## 2015-03-28 MED ORDER — METFORMIN HCL 500 MG PO TABS: 500.0000 mg | ORAL_TABLET | Freq: Two times a day (BID) | ORAL | Status: DC

## 2015-03-28 MED ORDER — SODIUM CHLORIDE 0.9 % IV SOLN: 250.0000 mL | INTRAVENOUS | Status: DC | PRN

## 2015-03-28 MED ORDER — ASPIRIN 81 MG PO CHEW
CHEWABLE_TABLET | ORAL | Status: AC
  Filled 2015-03-28: qty 1

## 2015-03-28 MED ORDER — VERAPAMIL HCL 2.5 MG/ML IV SOLN
INTRAVENOUS | Status: AC
  Filled 2015-03-28: qty 2

## 2015-03-28 MED ORDER — HEPARIN (PORCINE) IN NACL 2-0.9 UNIT/ML-% IJ SOLN
INTRAMUSCULAR | Status: AC
  Filled 2015-03-28: qty 1500

## 2015-03-28 MED ORDER — HEPARIN SODIUM (PORCINE) 1000 UNIT/ML IJ SOLN
INTRAMUSCULAR | Status: AC
  Filled 2015-03-28: qty 1

## 2015-03-28 MED ORDER — LIDOCAINE HCL (PF) 1 % IJ SOLN
INTRAMUSCULAR | Status: AC
  Filled 2015-03-28: qty 30

## 2015-03-28 MED ORDER — SODIUM CHLORIDE 0.9 % WEIGHT BASED INFUSION: 3.0000 mL/kg/h | INTRAVENOUS | Status: DC

## 2015-03-28 MED ORDER — VERAPAMIL HCL 2.5 MG/ML IV SOLN
INTRA_ARTERIAL | Status: DC | PRN
  Administered 2015-03-28: 5 mL via INTRA_ARTERIAL

## 2015-03-28 MED ORDER — NITROGLYCERIN 1 MG/10 ML FOR IR/CATH LAB
INTRA_ARTERIAL | Status: AC
  Filled 2015-03-28: qty 10

## 2015-03-28 MED ORDER — ASPIRIN 81 MG PO CHEW
81.0000 mg | CHEWABLE_TABLET | ORAL | Status: AC
  Administered 2015-03-28: 81 mg via ORAL

## 2015-03-28 MED ORDER — MIDAZOLAM HCL 2 MG/2ML IJ SOLN
INTRAMUSCULAR | Status: AC
  Filled 2015-03-28: qty 4

## 2015-03-28 SURGICAL SUPPLY — 14 items
CATH MICROCATH NAVVUS (MICROCATHETER) IMPLANT
CATH OPTITORQUE TIG 4.0 5F (CATHETERS) ×2 IMPLANT
CATH VISTA GUIDE 6FR XBLAD3.5 (CATHETERS) ×1 IMPLANT
DEVICE RAD COMP TR BAND LRG (VASCULAR PRODUCTS) ×2 IMPLANT
GLIDESHEATH SLEND A-KIT 6F 20G (SHEATH) ×2 IMPLANT
KIT ESSENTIALS PG (KITS) ×1 IMPLANT
KIT HEART LEFT (KITS) ×2 IMPLANT
MICROCATHETER NAVVUS (MICROCATHETER) ×4
PACK CARDIAC CATHETERIZATION (CUSTOM PROCEDURE TRAY) ×2 IMPLANT
TRANSDUCER W/STOPCOCK (MISCELLANEOUS) ×2 IMPLANT
TUBING CIL FLEX 10 FLL-RA (TUBING) ×2 IMPLANT
WIRE COUGAR XT STRL 190CM (WIRE) ×1 IMPLANT
WIRE ROSEN-J .035X260CM (WIRE) IMPLANT
WIRE SAFE-T 1.5MM-J .035X260CM (WIRE) ×2 IMPLANT

## 2015-03-28 NOTE — Discharge Instructions (Signed)
NO METFORMIN/GLUCOPHAGE FOR 2 DAYS ° ° ° °Radial Site Care °Refer to this sheet in the next few weeks. These instructions provide you with information about caring for yourself after your procedure. Your health care provider may also give you more specific instructions. Your treatment has been planned according to current medical practices, but problems sometimes occur. Call your health care provider if you have any problems or questions after your procedure. °WHAT TO EXPECT AFTER THE PROCEDURE °After your procedure, it is typical to have the following: °· Bruising at the radial site that usually fades within 1-2 weeks. °· Blood collecting in the tissue (hematoma) that may be painful to the touch. It should usually decrease in size and tenderness within 1-2 weeks. °HOME CARE INSTRUCTIONS °· Take medicines only as directed by your health care provider. °· You may shower 24-48 hours after the procedure or as directed by your health care provider. Remove the bandage (dressing) and gently wash the site with plain soap and water. Pat the area dry with a clean towel. Do not rub the site, because this may cause bleeding. °· Do not take baths, swim, or use a hot tub until your health care provider approves. °· Check your insertion site every day for redness, swelling, or drainage. °· Do not apply powder or lotion to the site. °· Do not flex or bend the affected arm for 24 hours or as directed by your health care provider. °· Do not push or pull heavy objects with the affected arm for 24 hours or as directed by your health care provider. °· Do not lift over 10 lb (4.5 kg) for 5 days after your procedure or as directed by your health care provider. °· Ask your health care provider when it is okay to: °¨ Return to work or school. °¨ Resume usual physical activities or sports. °¨ Resume sexual activity. °· Do not drive home if you are discharged the same day as the procedure. Have someone else drive you. °· You may drive 24  hours after the procedure unless otherwise instructed by your health care provider. °· Do not operate machinery or power tools for 24 hours after the procedure. °· If your procedure was done as an outpatient procedure, which means that you went home the same day as your procedure, a responsible adult should be with you for the first 24 hours after you arrive home. °· Keep all follow-up visits as directed by your health care provider. This is important. °SEEK MEDICAL CARE IF: °· You have a fever. °· You have chills. °· You have increased bleeding from the radial site. Hold pressure on the site. °SEEK IMMEDIATE MEDICAL CARE IF: °· You have unusual pain at the radial site. °· You have redness, warmth, or swelling at the radial site. °· You have drainage (other than a small amount of blood on the dressing) from the radial site. °· The radial site is bleeding, and the bleeding does not stop after 30 minutes of holding steady pressure on the site. °· Your arm or hand becomes pale, cool, tingly, or numb. °  °This information is not intended to replace advice given to you by your health care provider. Make sure you discuss any questions you have with your health care provider. °  °Document Released: 06/19/2010 Document Revised: 06/07/2014 Document Reviewed: 12/03/2013 °Elsevier Interactive Patient Education ©2016 Elsevier Inc. ° °

## 2015-03-28 NOTE — Interval H&P Note (Signed)
History and Physical Interval Note:  03/28/2015 6:28 AM  [name] [name]  has presented today for surgery, with the diagnosis of cp  The various methods of treatment have been discussed with the patient and family. After consideration of risks, benefits and other options for treatment, the patient has consented to  Procedure(s): Left Heart Cath and Coronary Angiography (N/A) and possible PCI  as a surgical intervention .  The patient's history has been reviewed, patient examined, no change in status, stable for surgery, although stress test low risk, clinically at least an intermediate risk scan given low EF and DM and angoing chest pain.   I have reviewed the patient's chart and labs.  Questions were answered to the patient's satisfaction.   Ischemic Symptoms? CCS II (Slight limitation of ordinary activity) Anti-ischemic Medical Therapy? Maximal Medical Therapy (2 or more classes of medications) Non-invasive Test Results? Intermediate-risk stress test findings: cardiac mortality 1-3%/year Prior CABG? No Previous CABG   Patient Information:   1-2V CAD, no prox LAD  A (7)  Indication: 17; Score: 7   Patient Information:   CTO of 1 vessel, no other CAD  U (5)  Indication: 27; Score: 5   Patient Information:   1V CAD with prox LAD  A (8)  Indication: 33; Score: 8   Patient Information:   2V-CAD with prox LAD  A (7)  Indication: 39; Score: 7   Patient Information:   3V-CAD without LMCA  A (8)  Indication: 45; Score: 8   Patient Information:   3V-CAD without LMCA With Abnormal LV systolic function  A (9)  Indication: 48; Score: 9   Patient Information:   LMCA-CAD  A (9)  Indication: 49; Score: 9   Patient Information:   2V-CAD with prox LAD PCI  A (7)  Indication: 62; Score: 7   Patient Information:   2V-CAD with prox LAD CABG  A (8)  Indication: 62; Score: 8   Patient Information:   3V-CAD without LMCA With Low CAD burden(i.e., 3 focal  stenoses, low SYNTAX score) PCI  A (7)  Indication: 63; Score: 7   Patient Information:   3V-CAD without LMCA With Low CAD burden(i.e., 3 focal stenoses, low SYNTAX score) CABG  A (9)  Indication: 63; Score: 9   Patient Information:   3V-CAD without LMCA E06c - Intermediate-high CAD burden (i.e., multiple diffuse lesions, presence of CTO, or high SYNTAX score) PCI  U (4)  Indication: 64; Score: 4   Patient Information:   3V-CAD without LMCA E06c - Intermediate-high CAD burden (i.e., multiple diffuse lesions, presence of CTO, or high SYNTAX score) CABG  A (9)  Indication: 64; Score: 9   Patient Information:   LMCA-CAD With Isolated LMCA stenosis  PCI  U (6)  Indication: 65; Score: 6   Patient Information:   LMCA-CAD With Isolated LMCA stenosis  CABG  A (9)  Indication: 65; Score: 9   Patient Information:   LMCA-CAD Additional CAD, low CAD burden (i.e., 1- to 2-vessel additional involvement, low SYNTAX score) PCI  U (5)  Indication: 66; Score: 5   Patient Information:   LMCA-CAD Additional CAD, low CAD burden (i.e., 1- to 2-vessel additional involvement, low SYNTAX score) CABG  A (9)  Indication: 66; Score: 9   Patient Information:   LMCA-CAD Additional CAD, intermediate-high CAD burden (i.e., 3-vessel involvement, presence of CTO, or high SYNTAX score) PCI  I (3)  Indication: 67; Score: 3   Patient Information:   LMCA-CAD Additional CAD,  intermediate-high CAD burden (i.e., 3-vessel involvement, presence of CTO, or high SYNTAX score) CABG  A (9)  Indication: 67; Score: 9   Syd Newsome

## 2015-05-02 DIAGNOSIS — I25118 Atherosclerotic heart disease of native coronary artery with other forms of angina pectoris: Secondary | ICD-10-CM | POA: Insufficient documentation

## 2015-08-20 ENCOUNTER — Encounter (HOSPITAL_COMMUNITY): Payer: Self-pay | Admitting: Hematology & Oncology

## 2015-08-20 ENCOUNTER — Encounter (HOSPITAL_COMMUNITY): Payer: 59 | Attending: Hematology & Oncology | Admitting: Hematology & Oncology

## 2015-08-20 VITALS — BP 148/89 | HR 90 | Temp 98.4°F | Resp 18 | Ht 68.0 in | Wt 189.4 lb

## 2015-08-20 DIAGNOSIS — E119 Type 2 diabetes mellitus without complications: Secondary | ICD-10-CM | POA: Insufficient documentation

## 2015-08-20 DIAGNOSIS — Z9081 Acquired absence of spleen: Secondary | ICD-10-CM | POA: Insufficient documentation

## 2015-08-20 DIAGNOSIS — D72828 Other elevated white blood cell count: Secondary | ICD-10-CM

## 2015-08-20 DIAGNOSIS — F17210 Nicotine dependence, cigarettes, uncomplicated: Secondary | ICD-10-CM | POA: Diagnosis not present

## 2015-08-20 DIAGNOSIS — Z7984 Long term (current) use of oral hypoglycemic drugs: Secondary | ICD-10-CM | POA: Diagnosis not present

## 2015-08-20 DIAGNOSIS — Z720 Tobacco use: Secondary | ICD-10-CM | POA: Diagnosis not present

## 2015-08-20 DIAGNOSIS — D72829 Elevated white blood cell count, unspecified: Secondary | ICD-10-CM | POA: Diagnosis not present

## 2015-08-20 DIAGNOSIS — I10 Essential (primary) hypertension: Secondary | ICD-10-CM | POA: Diagnosis not present

## 2015-08-20 DIAGNOSIS — Z8249 Family history of ischemic heart disease and other diseases of the circulatory system: Secondary | ICD-10-CM | POA: Insufficient documentation

## 2015-08-20 DIAGNOSIS — R51 Headache: Secondary | ICD-10-CM

## 2015-08-20 DIAGNOSIS — Z833 Family history of diabetes mellitus: Secondary | ICD-10-CM | POA: Diagnosis not present

## 2015-08-20 DIAGNOSIS — Z79899 Other long term (current) drug therapy: Secondary | ICD-10-CM | POA: Insufficient documentation

## 2015-08-20 DIAGNOSIS — Z7982 Long term (current) use of aspirin: Secondary | ICD-10-CM | POA: Insufficient documentation

## 2015-08-20 DIAGNOSIS — D729 Disorder of white blood cells, unspecified: Secondary | ICD-10-CM

## 2015-08-20 DIAGNOSIS — E785 Hyperlipidemia, unspecified: Secondary | ICD-10-CM | POA: Diagnosis not present

## 2015-08-20 LAB — CBC WITH DIFFERENTIAL/PLATELET
BASOS PCT: 0 %
Basophils Absolute: 0.1 10*3/uL (ref 0.0–0.1)
EOS ABS: 0.4 10*3/uL (ref 0.0–0.7)
EOS PCT: 3 %
HCT: 47.2 % (ref 39.0–52.0)
Hemoglobin: 16.3 g/dL (ref 13.0–17.0)
Lymphocytes Relative: 25 %
Lymphs Abs: 3.2 10*3/uL (ref 0.7–4.0)
MCH: 31.6 pg (ref 26.0–34.0)
MCHC: 34.5 g/dL (ref 30.0–36.0)
MCV: 91.5 fL (ref 78.0–100.0)
MONO ABS: 0.8 10*3/uL (ref 0.1–1.0)
MONOS PCT: 7 %
Neutro Abs: 8.2 10*3/uL — ABNORMAL HIGH (ref 1.7–7.7)
Neutrophils Relative %: 65 %
PLATELETS: 254 10*3/uL (ref 150–400)
RBC: 5.16 MIL/uL (ref 4.22–5.81)
RDW: 13.4 % (ref 11.5–15.5)
WBC: 12.6 10*3/uL — ABNORMAL HIGH (ref 4.0–10.5)

## 2015-08-20 LAB — SAVE SMEAR

## 2015-08-20 LAB — SEDIMENTATION RATE: SED RATE: 1 mm/h (ref 0–16)

## 2015-08-20 LAB — C-REACTIVE PROTEIN

## 2015-08-20 NOTE — Patient Instructions (Addendum)
[facility] at [facility] Discharge Instructions  RECOMMENDATIONS MADE BY THE CONSULTANT AND ANY TEST RESULTS WILL BE SENT TO YOUR REFERRING PHYSICIAN.   Exam and discussion by Dr Galen ManilaPenland today You were sent here because your white blood cell count was elevated  Smoking can cause your white blood cell count to be increased We are going to run some blood tests, then we will bring you back to talk about these results  Return to see the doctor in 2 weeks  Please call the clinic if you have any questions or concerns    Thank you for choosing [facility] at [facility] to provide your oncology and hematology care.  To afford each patient quality time with our provider, please arrive at least 15 minutes before your scheduled appointment time.   Beginning January 23rd 2017 lab [facility] will be done in the  Main lab at [facility] on 1st floor. If you have a lab appointment with the Cancer Center please come in thru the  Main Entrance and check in at the main information desk  You need to re-schedule your appointment should you arrive 10 or more minutes late.  We strive to give you quality time with our providers, and arriving late affects you and other patients whose appointments are after yours.  Also, if you no show three or more times for appointments you may be dismissed from the clinic at the providers discretion.     Again, thank you for choosing [facility].  Our hope is that these requests will decrease the amount of time that you wait before being seen by our physicians.       _____________________________________________________________  Should you have questions after your visit to [facility], please contact our office at [phone] between the hours of 8:30 a.m. and 4:30 p.m.  Voicemails left after 4:30 p.m. will not be returned until the following business day.  For prescription refill  requests, have your pharmacy contact our office.         Resources For Cancer Patients and their Caregivers ? American Cancer Society: Can assist with transportation, wigs, general needs, runs Look Good Feel Better.        [phone] ? Cancer Care: Provides financial assistance, online support groups, medication/co-pay assistance.  1-800-813-HOPE 661-254-1434(4673) ? [facility] Assists [place] cancer patients and their families through emotional , educational and financial support.  [phone] ? [place] DSS Where to apply for food stamps, Medicaid and utility assistance. 313-203-87377638376209 ? [facility]: Transportation to medical appointments. [phone] ? Social Security Administration: May apply for disability if have a Stage IV cancer. [phone] ? [place] [facility], Dis[employer] and Transit Services: Assists with nutrition, care and transit needs. [phone]

## 2015-08-20 NOTE — Progress Notes (Signed)
[place] at [facility] Progress Note  Patient Care Team: Shanon Rosser, PA-C as PCP - General (Physician Assistant) Adrian Prows, MD as Consulting Physician (Cardiology)  CHIEF COMPLAINTS/PURPOSE OF CONSULTATION:  Leukocytosis, neutrophilia Labs dated 08/06/2015 total WBC count of 16K, hgb 16.8 gm/dl, hct 48.9, MCV 91, platelets 278K, predominant neutrophilia  HISTORY OF PRESENTING ILLNESS:  [name] [name] 45 y.o. male is here because of elevated WBC counts. Total WBC count was 16,000 and predominant neutrophilia.   Mr. Mccowen was here alone today.   He recently went to see his PCP Dr. Laverta Baltimore at the urgent care center which he uses for his regular health care. He has been seeing Dr. Laverta Baltimore for 2 years. He had a blood test done and was given an antibiotic because of high WBC counts.  He says that he is healthy otherwise and is really active. He got up at 9 this morning took his medication ate a cookie, because he doesn't usually do breakfast, and went to work in the yard and paint a shed outdoors. 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   He gets headaches when he is frustrated and notes that he has blurred vision since he got his glasses last year. Sometimes when he watches tv everything gets a little fuzzy.  He smokes half a pack a day and has smoked since he was 45 years old. He used to smoke more and at his heaviest he smoked a pack and a half a day.   He sometimes has breathing problems. He said his appetite is good but sometimes he doesn't feel like eating. A couple of years ago he weighed 250 lbs but is now down to 189 lbs. His friends noted that he lost too much weight. When he was weighed at Dr. Erlinda Hong office he weighed 184 lbs. He was there for his test results on his recent colonoscopy and EGD in January. There were some polyps on these but they were not cancerous. They were normal otherwise.   He denies night sweats. He denies change in activity or energy level.  He  reports weight loss as detailed but no other major concerns.   MEDICAL HISTORY:  Past Medical History  Diagnosis Date  . Malaise and fatigue   . Hypertension   . Atypical chest pain   . Hyperlipemia   . Diabetes mellitus without complication ([place])     SURGICAL HISTORY: Past Surgical History  Procedure Laterality Date  . Appendectomy  1975  . Ankle surgery  1982  . Cardiac catheterization N/A 03/28/2015    Procedure: Left Heart Cath and Coronary Angiography;  Surgeon: Adrian Prows, MD;  Location: [place] [facility];  Service: Cardiovascular;  Laterality: N/A;  . Cardiac catheterization N/A 03/28/2015    Procedure: Intravascular Pressure Wire/FFR Study;  Surgeon: Adrian Prows, MD;  Location: [place] [facility];  Service: Cardiovascular;  Laterality: N/A;    SOCIAL HISTORY: Social History   Social History  . Marital Status: Single    Spouse Name: N/A  . Number of Children: 0  . Years of Education: 10th g   Occupational History  . [employer]    Social History Main Topics  . Smoking status: Current Every Day Smoker -- 1.00 packs/day for 29 years    Types: Cigarettes  . Smokeless tobacco: Not on file  . Alcohol Use: No  . Drug Use: No  . Sexual Activity: Not on file   Other Topics Concern  .  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 2 brothers 1 sister-died 2010-06-09 from congestive heart failure was 23 *dont know what caused it   FAMILY HISTORY: Family History  Problem Relation Age of Onset  . Congestive Heart Failure Sister   . Diabetes Mother   . Hypertension Mother   .  Diabetes Father   . Hypertension Father    indicated that his mother is alive. He indicated that his father is alive. He indicated that his sister is deceased. He indicated that both of his brothers are alive.   ALLERGIES:  is allergic to omeprazole magnesium.  MEDICATIONS:  Current Outpatient Prescriptions  Medication Sig Dispense Refill  . aspirin 81 MG chewable tablet Chew by mouth daily.    Marland Kitchen atorvastatin (LIPITOR) 40 MG tablet Take 40 mg by mouth daily.     . Empagliflozin-Linagliptin (GLYXAMBI) 10-5 MG TABS Take 1 tablet by mouth every evening.    . Fenofibric Acid 105 MG TABS Take by mouth.    Marland Kitchen glimepiride (AMARYL) 4 MG tablet Take 4 mg by mouth daily with breakfast.    . Insulin Degludec (TRESIBA FLEXTOUCH) 200 UNIT/ML SOPN Inject 18 Units into the skin.    . metFORMIN (GLUCOPHAGE) 500 MG tablet Take 1 tablet (500 mg total) by mouth 2 (two) times daily with a meal.    . metoprolol succinate (TOPROL-XL) 25 MG 24 hr tablet Take 25 mg by mouth daily.    . nitroGLYCERIN (NITROSTAT) 0.4 MG SL tablet Place 0.4 mg under the tongue every 5 (five) minutes as needed for chest pain.    Marland Kitchen sertraline (ZOLOFT) 50 MG tablet Take 100 mg by mouth at bedtime.     . valsartan (DIOVAN) 80 MG tablet Take 80 mg by mouth daily.    . varenicline (CHANTIX) 1 MG tablet Take 1 mg by mouth 2 (two) times daily.    Marland Kitchen acetaminophen (TYLENOL) 500 MG tablet Take 500 mg by mouth every 6 (six) hours as needed.     No current facility-administered medications for this visit.    Review of Systems  Constitutional: Negative.   HENT:       Headaches when frustrated   Eyes: Positive for blurred vision.       Got glasses last year.  Respiratory: Negative.        Breathing problems sometimes  Cardiovascular: Negative.   Gastrointestinal: Negative.   Genitourinary: Negative.   Musculoskeletal: Negative.   Skin: Negative.   Neurological: Positive for headaches.  Endo/Heme/Allergies: Negative.     Psychiatric/Behavioral: Negative.   All other systems reviewed and are negative.  14 point ROS was done and is otherwise as detailed above or in HPI   PHYSICAL EXAMINATION: ECOG PERFORMANCE STATUS: 0 - Asymptomatic  Filed Vitals:   08/20/15 1514  BP: 148/89  Pulse: 90  Temp: 98.4 F (36.9 C)  Resp: 18   Filed Weights   08/20/15 1514  Weight: 189 lb 6.4 oz (85.911 kg)     Physical Exam  Constitutional: He is oriented to person, place, and time and well-developed, well-nourished, and in no distress.  Wears  glasses  HENT:  Head: Normocephalic and atraumatic.  Nose: Nose normal.  Mouth/Throat: Oropharynx is clear and moist. No oropharyngeal exudate.  Eyes: Conjunctivae and EOM are normal. Pupils are equal, round, and reactive to light. Right eye exhibits no discharge. Left eye exhibits no discharge. No scleral icterus.  Neck: Normal range of motion. Neck supple. No tracheal deviation present. No thyromegaly present.  Cardiovascular: Normal rate, regular rhythm and normal heart sounds.  Exam reveals no gallop and no friction rub.   No murmur heard. Pulmonary/Chest: Effort normal and breath sounds normal. He has no wheezes. He has no rales.  Abdominal: Soft. Bowel sounds are normal. He exhibits no distension and no mass. There is tenderness. There is no rebound and no guarding.  Predominantly R sided tenderness with palpation but no rebound or guarding  Musculoskeletal: Normal range of motion. He exhibits no edema.  Lymphadenopathy:    He has no cervical adenopathy.  Neurological: He is alert and oriented to person, place, and time. He has normal reflexes. No cranial nerve deficit. Gait normal. Coordination normal.  Skin: Skin is warm and dry. No rash noted.  Psychiatric: Mood, memory, affect and judgment normal.  Nursing note and vitals reviewed.  LABORATORY DATA:  I have reviewed the data as listed;   Labs reviewed as detailed under chief complaint  ASSESSMENT & PLAN:   Leukocytosis, neutrophilia Labs dated 08/06/2015 total WBC count of 16K, hgb 16.8 gm/dl, hct 48.9, MCV 91, platelets 278K, predominant neutrophilia Tobacco Use  I reviewed the potential causes of leukocytosis (specifically mild neutrophilia) including but not limited to:  ?Any active inflammatory condition or infection ?Cigarette smoking, which may be the most common cause of mild neutrophilia ?Previously diagnosed hematologic disease (such as acute and chronic leukemias, chronic myeloproliferative or myelodysplastic disease) ?The presence of, and treatment for, a chronic anxiety state, panic disorder, rage, or emotional stress (eg, posttraumatic stress disorder, depression) ?Presence of non-hematologic diseases known to increase neutrophil counts (eg, eclampsia, thyroid storm, hypercortisolism). ?Prior splenectomy or known asplenia ?Positive family history of neutrophilia ?Recent vaccination  Medications - Various medications may cause neutrophilia. However,  such cases are rare and appear in the literature as isolated case reports.  Plan today is to proceed with a CBC with peripheral smear review, evaluation for MPD, BCR-ABL to r/o CML, CRP and ESR to look for occult inflammatory disease  I will see him back once his lab results have come back in, in two weeks. We will go over everything at that time.  Orders Placed This Encounter  Procedures  . CBC with Differential    Standing Status: Future     Number of Occurrences: 1     Standing Expiration Date: 08/19/2016  . Pathologist smear review    Standing Status: Future     Number of Occurrences: 1     Standing Expiration Date: 08/19/2016  . Other/Misc lab test    Order Specific Question:  Test name / description:    Answer:  flow cytometry  . JAK2 V617F, Rfx CALR/E12/MPL    Standing Status: Future     Number of Occurrences: 1     Standing Expiration Date: 08/19/2016  . BCR-ABL1, CML/ALL, PCR, QUANT  . Sedimentation rate    Standing  Status: Future     Number of Occurrences: 1     Standing Expiration Date: 08/19/2016  . C-reactive protein    Standing Status: Future     Number of Occurrences: 1     Standing Expiration  Date: 08/19/2016  . Save smear    All questions were answered. The patient knows to call the clinic with any problems, questions or concerns.  This document serves as a record of services personally performed by Ancil Linsey, MD. It was created on her behalf by Kandace Blitz, a trained medical scribe. The creation of this record is based on the scribe's personal observations and the provider's statements to them. This document has been checked and approved by the attending provider.  I have reviewed the above documentation for accuracy and completeness, and I agree with the above.  This note was electronically signed.  Molli Hazard, MD  08/20/2015 4:19 PM

## 2015-08-20 NOTE — Progress Notes (Signed)
[name] [name]'s reason for visit today is for labs as scheduled per MD orders.  Venipuncture performed with a 23 gauge butterfly needle to L Antecubital.  Jerris L Paolillo tolerated procedure well and without incident; questions were answered and patient was discharged.

## 2015-08-28 ENCOUNTER — Other Ambulatory Visit: Payer: Self-pay | Admitting: Gastroenterology

## 2015-08-28 DIAGNOSIS — R1084 Generalized abdominal pain: Secondary | ICD-10-CM

## 2015-08-29 ENCOUNTER — Other Ambulatory Visit: Payer: 59

## 2015-09-03 ENCOUNTER — Encounter (HOSPITAL_COMMUNITY): Payer: Self-pay | Admitting: Hematology & Oncology

## 2015-09-03 ENCOUNTER — Encounter (HOSPITAL_COMMUNITY): Payer: 59 | Attending: Hematology & Oncology | Admitting: Hematology & Oncology

## 2015-09-03 VITALS — BP 124/84 | HR 87 | Temp 99.0°F | Resp 18 | Wt 188.6 lb

## 2015-09-03 DIAGNOSIS — Z8249 Family history of ischemic heart disease and other diseases of the circulatory system: Secondary | ICD-10-CM | POA: Insufficient documentation

## 2015-09-03 DIAGNOSIS — E785 Hyperlipidemia, unspecified: Secondary | ICD-10-CM | POA: Insufficient documentation

## 2015-09-03 DIAGNOSIS — D72829 Elevated white blood cell count, unspecified: Secondary | ICD-10-CM | POA: Insufficient documentation

## 2015-09-03 DIAGNOSIS — E119 Type 2 diabetes mellitus without complications: Secondary | ICD-10-CM | POA: Insufficient documentation

## 2015-09-03 DIAGNOSIS — Z9081 Acquired absence of spleen: Secondary | ICD-10-CM | POA: Insufficient documentation

## 2015-09-03 DIAGNOSIS — D729 Disorder of white blood cells, unspecified: Secondary | ICD-10-CM

## 2015-09-03 DIAGNOSIS — Z7982 Long term (current) use of aspirin: Secondary | ICD-10-CM | POA: Insufficient documentation

## 2015-09-03 DIAGNOSIS — Z7984 Long term (current) use of oral hypoglycemic drugs: Secondary | ICD-10-CM | POA: Insufficient documentation

## 2015-09-03 DIAGNOSIS — Z720 Tobacco use: Secondary | ICD-10-CM

## 2015-09-03 DIAGNOSIS — I10 Essential (primary) hypertension: Secondary | ICD-10-CM | POA: Insufficient documentation

## 2015-09-03 DIAGNOSIS — D72828 Other elevated white blood cell count: Secondary | ICD-10-CM

## 2015-09-03 DIAGNOSIS — F17210 Nicotine dependence, cigarettes, uncomplicated: Secondary | ICD-10-CM | POA: Insufficient documentation

## 2015-09-03 DIAGNOSIS — Z833 Family history of diabetes mellitus: Secondary | ICD-10-CM | POA: Insufficient documentation

## 2015-09-03 DIAGNOSIS — Z79899 Other long term (current) drug therapy: Secondary | ICD-10-CM | POA: Insufficient documentation

## 2015-09-03 LAB — CALR + JAK2 E12-15 + MPL (REFLEXED)

## 2015-09-03 LAB — JAK2 V617F, W REFLEX TO CALR/E12/MPL

## 2015-09-03 NOTE — Progress Notes (Signed)
[facility] at [facility] Progress Note  Patient Care Team: Lindaann Pascal, PA-C as PCP - General (Physician Assistant) Yates Decamp, MD as Consulting Physician (Cardiology)  CHIEF COMPLAINTS/PURPOSE OF CONSULTATION:  Leukocytosis, neutrophilia Labs dated 08/06/2015 total WBC count of 16K, hgb 16.8 gm/dl, hct 16.1, MCV 91, platelets 278K, predominant neutrophilia  HISTORY OF PRESENTING ILLNESS:  [name] [name] 45 y.o. male is here because of elevated WBC counts. Total WBC count was 16,000 and predominant neutrophilia.   Mr. Delcarlo was here alone today.   He recently went to see his PCP Dr. Jacqulyn Bath at the urgent care center which he uses for his regular health care. He has been seeing Dr. Jacqulyn Bath for 2 years. He had a blood test done and was given an antibiotic because of high WBC counts.  His blood work looked good but we are still waiting on his JAK2 results. We will call him when we get his test results back.  He has tried to stop smoking with a nicotine patch and has Chantix at his house. He notes that stopping smoking is very hard for him.   He currently denies any fever, chills, change in appetite or energy level. No other complaints. Here today for ongoing follow-up.  MEDICAL HISTORY:  Past Medical History  Diagnosis Date  . Malaise and fatigue   . Hypertension   . Atypical chest pain   . Hyperlipemia   . Diabetes mellitus without complication ([facility])     SURGICAL HISTORY: Past Surgical History  Procedure Laterality Date  . Appendectomy  1975  . Ankle surgery  1982  . Cardiac catheterization N/A 03/28/2015    Procedure: Left Heart Cath and Coronary Angiography;  Surgeon: Yates Decamp, MD;  Location: [facility];  Service: Cardiovascular;  Laterality: N/A;  . Cardiac catheterization N/A 03/28/2015    Procedure: Intravascular Pressure Wire/FFR Study;  Surgeon: Yates Decamp, MD;  [facility]: [facility];  Service: Cardiovascular;  Laterality: N/A;    SOCIAL  HISTORY: Social History   Social History  . Marital Status: Single    Spouse Name: N/A  . Number of Children: 0  . Years of Education: 10th g   Occupational History  . [employer]    Social History Main Topics  . Smoking status: Current Every Day Smoker -- 1.00 packs/day for 29 years    Types: Cigarettes  . Smokeless tobacco: Not on file  . Alcohol Use: No  . Drug Use: No  . Sexual Activity: Not on file   Other Topics Concern  . 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 2 brothers 1 sister-died 2011-12-28from congestive heart failure was 66 *dont know what caused it   FAMILY HISTORY: Family History  Problem Relation Age of Onset  . Congestive Heart Failure Sister   . Diabetes Mother   . Hypertension Mother   . Diabetes Father   . Hypertension Father    indicated that his mother is alive. He indicated that his father is alive. He indicated that his sister is deceased. He indicated that both of  his brothers are alive.   ALLERGIES:  is allergic to omeprazole magnesium.  MEDICATIONS:  Current Outpatient Prescriptions  Medication Sig Dispense Refill  . acetaminophen (TYLENOL) 500 MG tablet Take 500 mg by mouth every 6 (six) hours as needed.    Marland Kitchen. aspirin 81 MG chewable tablet Chew by mouth daily.    Marland Kitchen. atorvastatin (LIPITOR) 40 MG tablet Take 40 mg by mouth daily.     . Empagliflozin-Linagliptin (GLYXAMBI) 10-5 MG TABS Take 1 tablet by mouth every evening.    . Fenofibric Acid 105 MG TABS Take by mouth.    Marland Kitchen. glimepiride (AMARYL) 4 MG tablet Take 4 mg by mouth daily with breakfast.     . metFORMIN (GLUCOPHAGE) 500 MG tablet Take 1 tablet (500 mg total) by mouth 2 (two) times daily with a meal.    . metoprolol succinate (TOPROL-XL) 25 MG 24 hr tablet Take 25 mg by mouth daily.    . nitroGLYCERIN (NITROSTAT) 0.4 MG SL tablet Place 0.4 mg under the tongue every 5 (five) minutes as needed for chest pain.    Marland Kitchen. sertraline (ZOLOFT) 50 MG tablet Take 100 mg by mouth at bedtime.     . valsartan (DIOVAN) 80 MG tablet Take 80 mg by mouth daily.    . varenicline (CHANTIX) 1 MG tablet Take 1 mg by mouth 2 (two) times daily.    . Insulin Degludec (TRESIBA FLEXTOUCH) 200 UNIT/ML SOPN Inject 18 Units into the skin.     No current facility-administered medications for this visit.    Review of Systems  Constitutional: Negative.   HENT:       Headaches when frustrated   Eyes: Positive for blurred vision.       Got glasses last year.  Respiratory: Negative.        Breathing problems sometimes  Cardiovascular: Negative.   Gastrointestinal: Negative.   Genitourinary: Negative.   Musculoskeletal: Negative.   Skin: Negative.   Neurological: Positive for headaches.  Endo/Heme/Allergies: Negative.   Psychiatric/Behavioral: Negative.   All other systems reviewed and are negative.  14 point ROS was done and is otherwise as detailed above or in HPI   PHYSICAL EXAMINATION: ECOG PERFORMANCE STATUS: 0 - Asymptomatic  Filed Vitals:   09/03/15 1300  BP: 124/84  Pulse: 87  Temp: 99 F (37.2 C)  Resp: 18   Filed Weights   09/03/15 1300  Weight: 188 lb 9.6 oz (85.548 kg)     Physical Exam  Constitutional: He is oriented to person, place, and time and well-developed, well-nourished, and in no distress.  Wears glasses  HENT:  Head: Normocephalic and atraumatic.  Nose: Nose normal.  Mouth/Throat: Oropharynx is clear and moist. No oropharyngeal exudate.  Eyes: Conjunctivae and EOM are normal. Pupils are equal, round, and reactive to light. Right eye exhibits no discharge. Left  eye exhibits no discharge. No scleral icterus.  Neck: Normal range of motion. Neck supple. No tracheal deviation present. No thyromegaly present.  Cardiovascular: Normal rate, regular rhythm and normal heart sounds.  Exam reveals no gallop and no friction rub.   No murmur heard. Pulmonary/Chest: Effort normal and breath sounds normal. He has no wheezes. He has no rales.  Abdominal: Soft. Bowel sounds are normal. He exhibits no distension and no mass. There is tenderness. There is no rebound and no guarding.  Predominantly R sided tenderness with palpation but no rebound or guarding  Musculoskeletal: Normal range of motion. He exhibits no edema.  Lymphadenopathy:    He has  no cervical adenopathy.  Neurological: He is alert and oriented to person, place, and time. He has normal reflexes. No cranial nerve deficit. Gait normal. Coordination normal.  Skin: Skin is warm and dry. No rash noted.  Psychiatric: Mood, memory, affect and judgment normal.  Nursing note and vitals reviewed.  LABORATORY DATA:  I have reviewed the data as listed;   Results for ROEY, COOPMAN (MRN [id number]) as of 09/03/2015 13:17  Ref. Range 08/20/2015 16:00 08/20/2015 16:04  CRP Latest Ref Range: <1.0 mg/dL  <8.2  WBC Latest Ref Range: 4.0-10.5 K/uL 12.6 (H)   RBC Latest Ref Range: 4.22-5.81 MIL/uL 5.16   Hemoglobin Latest Ref Range: 13.0-17.0 g/dL 95.6   HCT Latest Ref Range: 39.0-52.0 % 47.2   MCV Latest Ref Range: 78.0-100.0 fL 91.5   MCH Latest Ref Range: 26.0-34.0 pg 31.6   MCHC Latest Ref Range: 30.0-36.0 g/dL 21.3   RDW Latest Ref Range: 11.5-15.5 % 13.4   Platelets Latest Ref Range: 150-400 K/uL 254   Neutrophils Latest Units: % 65   Lymphocytes Latest Units: % 25   Monocytes Relative Latest Units: % 7   Eosinophil Latest Units: % 3   Basophil Latest Units: % 0   NEUT# Latest Ref Range: 1.7-7.7 K/uL 8.2 (H)   Lymphocyte # Latest Ref Range: 0.7-4.0 K/uL 3.2   Monocyte # Latest Ref Range: 0.1-1.0 K/uL 0.8     Eosinophils Absolute Latest Ref Range: 0.0-0.7 K/uL 0.4   Basophils Absolute Latest Ref Range: 0.0-0.1 K/uL 0.1   Smear Review Unknown SMEAR STAINED AND...   Sed Rate Latest Ref Range: 0-16 mm/hr 1   b2a2 transcript Latest Units: % Comment   b3a2 transcript Latest Units: % Comment   E1A2 Transcript Latest Units: % Comment   Interpretation (BCRAL): Unknown Comment   Director Review (BCRAL): Unknown Comment   Background (BCRAL): Unknown PENDING    Labs reviewed as detailed under chief complaint  Pathology:   MPD evaluation Negative BCR-ABL transcripts Negative   ASSESSMENT & PLAN:  Leukocytosis, neutrophilia Labs dated 08/06/2015 total WBC count of 16K, hgb 16.8 gm/dl, hct 08.6, MCV 91, platelets 278K, predominant neutrophilia Tobacco Use  I reviewed the potential causes of leukocytosis (specifically mild neutrophilia) including but not limited to:  ?Any active inflammatory condition or infection ?Cigarette smoking, which may be the most common cause of mild neutrophilia ?Previously diagnosed hematologic disease (such as acute and chronic leukemias, chronic myeloproliferative or myelodysplastic disease) ?The presence of, and treatment for, a chronic anxiety state, panic disorder, rage, or emotional stress (eg, posttraumatic stress disorder, depression) ?Presence of non-hematologic diseases known to increase neutrophil counts (eg, eclampsia, thyroid storm, hypercortisolism). ?Prior splenectomy or known asplenia ?Positive family history of neutrophilia ?Recent vaccination  Medications - Various medications may cause neutrophilia. However,  such cases are rare and appear in the literature as isolated case reports.  I advised the patient that based upon current results I feel his neutrophilia is reactive. Potentially secondary to his tobacco use. He was encouraged to stop smoking. I have recommended ongoing observation.  BMBX can be considered in the future if counts continue to be  elevated or further elevation is noted   Orders Placed This Encounter  Procedures  . CBC with Differential    Standing Status: Future     Number of Occurrences:      Standing Expiration Date: 09/02/2016   All questions were answered. The patient knows to call the clinic with any problems, questions or concerns.  This document  serves as a record of services personally performed by Loma Messing, MD. It was created on her behalf by Milas Hock, a trained medical scribe. The creation of this record is based on the scribe's personal observations and the provider's statements to them. This document has been checked and approved by the attending provider.  I have reviewed the above documentation for accuracy and completeness, and I agree with the above.  This note was electronically signed.  Arvil Chaco, MD  09/03/2015 1:54 PM

## 2015-09-03 NOTE — Patient Instructions (Addendum)
[facility] Discharge Instructions  RECOMMENDATIONS MADE BY THE CONSULTANT AND ANY TEST RESULTS WILL BE SENT TO YOUR REFERRING PHYSICIAN.   Exam and discussion by Dr Galen ManilaPenland today Try to take your chantix and quit smoking  Labs in 3 months Return to see the doctor in 3 months  Please call the clinic if you have any questions or concerns    Thank you for choosing [facility] at [facility] to provide your oncology and hematology care.  To afford each patient quality time with our provider, please arrive at least 15 minutes before your scheduled appointment time.   Beginning January 23rd 2017 lab work for the [facility] will be done in the  Main lab at [facility] on 1st floor. If you have a lab appointment with the Cancer Center please come in thru the  Main Entrance and check in at the main information desk  You need to re-schedule your appointment should you arrive 10 or more minutes late.  We strive to give you quality time with our providers, and arriving late affects you and other patients whose appointments are after yours.  Also, if you no show three or more times for appointments you may be dismissed from the clinic at the providers discretion.     Again, thank you for choosing [facility].  Our hope is that these requests will decrease the amount of time that you wait before being seen by our physicians.       _____________________________________________________________  Should you have questions after your visit to [facility], please contact our office at [phone] between the hours of 8:30 a.m. and 4:30 p.m.  Voicemails left after 4:30 p.m. will not be returned until the following business day.  For prescription refill requests, have your pharmacy contact our office.         Resources For Cancer Patients and their Caregivers ? American Cancer Society: Can assist with  transportation, wigs, general needs, runs Look Good Feel Better.        [phone] ? Cancer Care: Provides financial assistance, online support groups, medication/co-pay assistance.  1-800-813-HOPE 336-062-1057(4673) ? [facility] Assists [place] cancer patients and their families through emotional , educational and financial support.  ([phone] [phone] ? [place] DSS Where to apply for food stamps, Medicaid and utility assistance. ([phone] 687-0527660-227-2368 ? [facility]: Transportation to medical appointments. [phone] ? Social Security Administration: May apply for disability if have a Stage IV cancer. 305-813-0006516 [phone] ? [place] [employer], Disability and Transit Services: Assists with nutrition, care and transit needs. [phone]

## 2015-09-17 LAB — BCR-ABL1, CML/ALL, PCR, QUANT

## 2015-10-05 ENCOUNTER — Encounter (HOSPITAL_COMMUNITY): Payer: Self-pay | Admitting: Hematology & Oncology

## 2015-11-18 ENCOUNTER — Emergency Department (HOSPITAL_COMMUNITY): Payer: 59

## 2015-11-18 ENCOUNTER — Encounter (HOSPITAL_COMMUNITY): Payer: Self-pay | Admitting: Emergency Medicine

## 2015-11-18 ENCOUNTER — Other Ambulatory Visit: Payer: Self-pay

## 2015-11-18 ENCOUNTER — Emergency Department (HOSPITAL_COMMUNITY)
Admission: EM | Admit: 2015-11-18 | Discharge: 2015-11-19 | Disposition: A | Discharge status: Home / Self Care | Payer: 59 | Source: Home / Self Care | Attending: Emergency Medicine | Admitting: Emergency Medicine

## 2015-11-18 DIAGNOSIS — F17210 Nicotine dependence, cigarettes, uncomplicated: Secondary | ICD-10-CM | POA: Insufficient documentation

## 2015-11-18 DIAGNOSIS — Z7984 Long term (current) use of oral hypoglycemic drugs: Secondary | ICD-10-CM | POA: Insufficient documentation

## 2015-11-18 DIAGNOSIS — Z79899 Other long term (current) drug therapy: Secondary | ICD-10-CM | POA: Insufficient documentation

## 2015-11-18 DIAGNOSIS — R109 Unspecified abdominal pain: Secondary | ICD-10-CM | POA: Diagnosis not present

## 2015-11-18 DIAGNOSIS — E785 Hyperlipidemia, unspecified: Secondary | ICD-10-CM | POA: Insufficient documentation

## 2015-11-18 DIAGNOSIS — E860 Dehydration: Secondary | ICD-10-CM | POA: Insufficient documentation

## 2015-11-18 DIAGNOSIS — R52 Pain, unspecified: Secondary | ICD-10-CM

## 2015-11-18 DIAGNOSIS — R0602 Shortness of breath: Secondary | ICD-10-CM | POA: Insufficient documentation

## 2015-11-18 DIAGNOSIS — R197 Diarrhea, unspecified: Secondary | ICD-10-CM | POA: Diagnosis not present

## 2015-11-18 DIAGNOSIS — Z794 Long term (current) use of insulin: Secondary | ICD-10-CM

## 2015-11-18 DIAGNOSIS — I10 Essential (primary) hypertension: Secondary | ICD-10-CM | POA: Insufficient documentation

## 2015-11-18 DIAGNOSIS — E119 Type 2 diabetes mellitus without complications: Secondary | ICD-10-CM | POA: Insufficient documentation

## 2015-11-18 DIAGNOSIS — Z7982 Long term (current) use of aspirin: Secondary | ICD-10-CM | POA: Diagnosis not present

## 2015-11-18 DIAGNOSIS — R000 Tachycardia, unspecified: Secondary | ICD-10-CM | POA: Diagnosis not present

## 2015-11-18 DIAGNOSIS — R112 Nausea with vomiting, unspecified: Secondary | ICD-10-CM | POA: Insufficient documentation

## 2015-11-18 LAB — COMPREHENSIVE METABOLIC PANEL
ALBUMIN: 4.3 g/dL (ref 3.5–5.0)
ALK PHOS: 79 U/L (ref 38–126)
ALT: 13 U/L — AB (ref 17–63)
AST: 14 U/L — AB (ref 15–41)
Anion gap: 11 (ref 5–15)
BILIRUBIN TOTAL: 1.3 mg/dL — AB (ref 0.3–1.2)
BUN: 24 mg/dL — AB (ref 6–20)
CALCIUM: 9.1 mg/dL (ref 8.9–10.3)
CO2: 26 mmol/L (ref 22–32)
Chloride: 101 mmol/L (ref 101–111)
Creatinine, Ser: 1.25 mg/dL — ABNORMAL HIGH (ref 0.61–1.24)
GFR calc Af Amer: >60 mL/min (ref >60)
GFR calc non Af Amer: >60 mL/min (ref >60)
GLUCOSE: 197 mg/dL — AB (ref 65–99)
Potassium: 4.1 mmol/L (ref 3.5–5.1)
SODIUM: 138 mmol/L (ref 135–145)
TOTAL PROTEIN: 7.6 g/dL (ref 6.5–8.1)

## 2015-11-18 LAB — URINALYSIS, ROUTINE W REFLEX MICROSCOPIC
Bilirubin Urine: NEGATIVE
Glucose, UA: >1000 mg/dL — AB
Hgb urine dipstick: NEGATIVE
LEUKOCYTES UA: NEGATIVE
NITRITE: NEGATIVE
PH: 7 (ref 5.0–8.0)
Protein, ur: 30 mg/dL — AB

## 2015-11-18 LAB — CBC
HCT: 52.5 % — ABNORMAL HIGH (ref 39.0–52.0)
Hemoglobin: 18.2 g/dL — ABNORMAL HIGH (ref 13.0–17.0)
MCH: 31.3 pg (ref 26.0–34.0)
MCHC: 34.7 g/dL (ref 30.0–36.0)
MCV: 90.2 fL (ref 78.0–100.0)
Platelets: 253 10*3/uL (ref 150–400)
RBC: 5.82 MIL/uL — ABNORMAL HIGH (ref 4.22–5.81)
RDW: 13.3 % (ref 11.5–15.5)
WBC: 23.1 10*3/uL — ABNORMAL HIGH (ref 4.0–10.5)

## 2015-11-18 LAB — URINE MICROSCOPIC-ADD ON: RBC / HPF: NONE SEEN RBC/hpf (ref 0–5)

## 2015-11-18 LAB — I-STAT TROPONIN, ED: Troponin i, poc: 0 ng/mL (ref 0.00–0.08)

## 2015-11-18 LAB — LIPASE, BLOOD: Lipase: 17 U/L (ref 11–51)

## 2015-11-18 MED ORDER — SODIUM CHLORIDE 0.9 % IV BOLUS (SEPSIS)
2000.0000 mL | Freq: Once | INTRAVENOUS | Status: AC
  Administered 2015-11-18: 2000 mL via INTRAVENOUS

## 2015-11-18 MED ORDER — PROMETHAZINE HCL 25 MG PO TABS: 25.0000 mg | ORAL_TABLET | Freq: Four times a day (QID) | ORAL | Status: DC | PRN

## 2015-11-18 MED ORDER — SODIUM CHLORIDE 0.9 % IV BOLUS (SEPSIS)
1000.0000 mL | Freq: Once | INTRAVENOUS | Status: AC
  Administered 2015-11-18: 1000 mL via INTRAVENOUS

## 2015-11-18 MED ORDER — ONDANSETRON HCL 4 MG/2ML IJ SOLN
4.0000 mg | Freq: Once | INTRAMUSCULAR | Status: AC
  Administered 2015-11-18: 4 mg via INTRAVENOUS
  Filled 2015-11-18: qty 2

## 2015-11-18 MED ORDER — ONDANSETRON 4 MG PO TBDP
4.0000 mg | ORAL_TABLET | Freq: Once | ORAL | Status: AC | PRN
  Administered 2015-11-18: 4 mg via ORAL
  Filled 2015-11-18: qty 1

## 2015-11-18 NOTE — ED Notes (Signed)
Pt tolerated fluids well. No nausea or vomiting noted.

## 2015-11-18 NOTE — Discharge Instructions (Signed)

## 2015-11-18 NOTE — ED Notes (Signed)
Pt c/o congestion that he cannot get up. Pt states he is sob and has been vomiting. Pt is coughing and gagging in triage.

## 2015-11-18 NOTE — ED Provider Notes (Signed)
Patient tolerating by mouth. No further vomiting. No bowel instruction on x-ray. Denies chest pain or abdominal pain. Hypertensive but did not take medications tonight.atient appears stable for discharge  Patient appears stable for discharge per plan establish by Dr. Lynelle DoctorKnapp. BP 178/104 mmHg  Pulse 98  Temp(Src) 98 F (36.7 C) (Oral)  Resp 23  Ht 5\' 8"  (1.727 m)  Wt 183 lb (83.008 kg)  BMI 27.83 kg/m2  SpO2 93%     [name] OctaveStephen Cynde Menard, MD 11/18/15 2355

## 2015-11-18 NOTE — ED Notes (Signed)
Pt refused wheelchair back to waiting room stating on the way out of triage that if he passed out he was suing this hospital.

## 2015-11-18 NOTE — ED Provider Notes (Signed)
CSN: [id number]     Arrival date & time 11/18/15  1851 History   First MD Initiated Contact with Patient 11/18/15 2050     Chief Complaint  Patient presents with  . Shortness of Breath    HPI Patient presents to the emergency room for evaluation of cough, congestion, shortness of breath, nausea vomiting, pain in his chest and abdomen. She started having upper respiratory type symptoms 1 week ago. He was seen by his primary doctor and it sounds like he was started on steroids and antibiotics. Patient states his symptoms persisted and he noticed that after starting the medications began having nausea, vomiting, acid discomfort in his throat. Patient has been hurting in his upper abdomen as well as his chest. He went to see his primary care doctor today.  He was given new prescriptions but is not sure the names of those. He was told if The symptoms persisted he should come to the emergency room. The patient has a history of coronary artery disease without any stents.  Because he was still not feeling well this afternoon he came into the emergency room to be evaluated. Past Medical History  Diagnosis Date  . Malaise and fatigue   . Hypertension   . Atypical chest pain   . Hyperlipemia   . Diabetes mellitus without complication [facility])    Past Surgical History  Procedure Laterality Date  . Appendectomy  1975  . Ankle surgery  1982  . Cardiac catheterization N/A 03/28/2015    Procedure: Left Heart Cath and Coronary Angiography;  Surgeon: Yates Decamp, MD;  Location: [facility] INVASIVE CV LAB;  Service: Cardiovascular;  Laterality: N/A;  . Cardiac catheterization N/A 03/28/2015    Procedure: Intravascular Pressure Wire/FFR Study;  Surgeon: Yates Decamp, MD;  Location: [facility] INVASIVE CV LAB;  Service: Cardiovascular;  Laterality: N/A;  Cath results:1. Normal LV systolic function, EF 55-60%. 2. Moderate coronary artery disease, proximal to midsegment of the LAD showing a diffuse 50% stenosis, there is rapid tapering  of the LAD from the proximal segment to the midsegment and followed by a hazy 50% stenosis. Mid to distal LAD also had a 50% stenosis. FFR to this region was negative for hemodynamic significance. There is minimal disease in the circumflex and RCA. Codominant system.   Family History  Problem Relation Age of Onset  . Congestive Heart Failure Sister   . Diabetes Mother   . Hypertension Mother   . Diabetes Father   . Hypertension Father    Social History  Substance Use Topics  . Smoking status: Current Every Day Smoker -- 1.00 packs/day for 29 years    Types: Cigarettes  . Smokeless tobacco: None  . Alcohol Use: No    Review of Systems  All other systems reviewed and are negative.     Allergies  Omeprazole magnesium  Home Medications   Prior to Admission medications   Medication Sig Start Date End Date Taking? Authorizing Provider  ALPRAZolam (XANAX) 0.5 MG tablet TAKE 1/2 TO 1 TABLET BY MOUTH DAILY AT BEDTIME *MAY TAKE 3 TIMES DAILY AS NEEDED FOR ANXIETY 11/06/15  Yes Historical Provider, MD  Insulin Degludec (TRESIBA FLEXTOUCH) 200 UNIT/ML SOPN Inject 18 Units into the skin at bedtime.    Yes Historical Provider, MD  metoprolol succinate (TOPROL-XL) 50 MG 24 hr tablet Take 50 mg by mouth daily. 10/28/15  Yes Historical Provider, MD  acetaminophen (TYLENOL) 500 MG tablet Take 500 mg by mouth every 6 (six) hours as needed.  Historical Provider, MD  amoxicillin-clavulanate (AUGMENTIN) 875-125 MG tablet Take 1 tablet by mouth every 12 (twelve) hours. 10 day course starting on 11/13/2015 11/13/15   Historical Provider, MD  aspirin 81 MG chewable tablet Chew by mouth daily.    Historical Provider, MD  atorvastatin (LIPITOR) 40 MG tablet Take 40 mg by mouth daily.  07/29/14   Historical Provider, MD  Empagliflozin-Linagliptin (GLYXAMBI) 10-5 MG TABS Take 1 tablet by mouth every evening.    Historical Provider, MD  Fenofibric Acid 105 MG TABS Take by mouth.    Historical Provider, MD   glimepiride (AMARYL) 4 MG tablet Take 4 mg by mouth daily with breakfast.    Historical Provider, MD  metFORMIN (GLUCOPHAGE) 500 MG tablet Take 1 tablet (500 mg total) by mouth 2 (two) times daily with a meal. 03/30/15   Yates Decamp, MD  nitroGLYCERIN (NITROSTAT) 0.4 MG SL tablet Place 0.4 mg under the tongue every 5 (five) minutes as needed for chest pain.    Historical Provider, MD  predniSONE (DELTASONE) 20 MG tablet Take 60 mg by mouth daily. 6 day course starting on 11/13/2015 11/13/15   Historical Provider, MD  sertraline (ZOLOFT) 50 MG tablet Take 100 mg by mouth at bedtime.     Historical Provider, MD  valsartan (DIOVAN) 80 MG tablet Take 80 mg by mouth daily.    Historical Provider, MD  varenicline (CHANTIX) 1 MG tablet Take 1 mg by mouth 2 (two) times daily.    Historical Provider, MD   BP 177/109 mmHg  Pulse 105  Temp(Src) 98 F (36.7 C) (Oral)  Resp 21  Ht 5\' 8"  (1.727 m)  Wt 83.008 kg  BMI 27.83 kg/m2  SpO2 100% Physical Exam  Constitutional: No distress.  HENT:  Head: Normocephalic and atraumatic.  Right Ear: External ear normal.  Left Ear: External ear normal.  Eyes: Conjunctivae are normal. Right eye exhibits no discharge. Left eye exhibits no discharge. No scleral icterus.  Neck: Neck supple. No tracheal deviation present.  Cardiovascular: Normal rate, regular rhythm and intact distal pulses.   Pulmonary/Chest: Effort normal and breath sounds normal. No stridor. No respiratory distress. He has no wheezes. He has no rales.  Abdominal: Soft. Bowel sounds are normal. He exhibits no distension. There is no tenderness. There is no rebound and no guarding.  Musculoskeletal: He exhibits no edema or tenderness.  Neurological: He is alert. He has normal strength. No cranial nerve deficit (no facial droop, extraocular movements intact, no slurred speech) or sensory deficit. He exhibits normal muscle tone. He displays no seizure activity. Coordination normal.  Skin: Skin is warm and  dry. No rash noted. He is not diaphoretic.  Psychiatric: He has a normal mood and affect.  Nursing note and vitals reviewed.   ED Course  Procedures  Medications given in the ED Medications  sodium chloride 0.9 % bolus 1,000 mL (not administered)  sodium chloride 0.9 % bolus 2,000 mL (not administered)  ondansetron (ZOFRAN) injection 4 mg (not administered)  ondansetron (ZOFRAN-ODT) disintegrating tablet 4 mg (4 mg Oral Given 11/18/15 1913)      Labs Review Labs Reviewed  COMPREHENSIVE METABOLIC PANEL - Abnormal; Notable for the following:    Glucose, Bld 197 (*)    BUN 24 (*)    Creatinine, Ser 1.25 (*)    AST 14 (*)    ALT 13 (*)    Total Bilirubin 1.3 (*)    All other components within normal limits  CBC - Abnormal; Notable for  the following:    WBC 23.1 (*)    RBC 5.82 (*)    Hemoglobin 18.2 (*)    HCT 52.5 (*)    All other components within normal limits  URINALYSIS, ROUTINE W REFLEX MICROSCOPIC (NOT AT [facility]) - Abnormal; Notable for the following:    Specific Gravity, Urine <1.005 (*)    Glucose, UA >1000 (*)    Ketones, ur TRACE (*)    Protein, ur 30 (*)    All other components within normal limits  URINE MICROSCOPIC-ADD ON - Abnormal; Notable for the following:    Squamous Epithelial / LPF 0-5 (*)    Bacteria, UA RARE (*)    All other components within normal limits  LIPASE, BLOOD  I-STAT TROPOININ, ED    Imaging Review Dg Chest 2 View  11/18/2015  CLINICAL DATA:  Acute onset of congestion and shortness of breath. Vomiting. Coughing and gagging. Initial encounter. EXAM: CHEST  2 VIEW COMPARISON:  Chest radiograph performed 07/29/2014 FINDINGS: The lungs are well-aerated and clear. There is no evidence of focal opacification, pleural effusion or pneumothorax. The heart is normal in size; the mediastinal contour is within normal limits. No acute osseous abnormalities are seen. IMPRESSION: No acute cardiopulmonary process seen. Electronically Signed   By: Roanna RaiderJeffery   Chang M.D.   On: 11/18/2015 21:19   I have personally reviewed and evaluated these images and lab results as part of my medical decision-making.   EKG Interpretation   Date/Time:  Tuesday November 18 2015 21:04:10 EDT Ventricular Rate:  93 PR Interval:    QRS Duration: 130 QT Interval:  371 QTC Calculation: 462 R Axis:   42 Text Interpretation:  Sinus rhythm Borderline short PR interval Right  bundle branch block ST elev, probable normal early repol pattern No  previous tracing Confirmed by Lonisha Bobby  MD-J, Avrey Hyser ([id number]) on 11/18/2015  9:13:24 PM      MDM   Final diagnoses:  Nausea and vomiting, vomiting of unspecified type  Dehydration    Pt's labs are consistent with dehydration.  WBC is elevated but I suspect this is related to his steroid use.  CXR does not show PNA.  Cardiac workup is negative.  I went in to discuss the results with the patient and unfortunately he started vomiting again.  Will start IV fluids and antinausea medications.  If we can get his vomiting under control he can be discharged with outpatient follow up.  Care will be turned over to oncoming MD.    Linwood DibblesJon Ndrew Creason, MD 11/18/15 2220

## 2015-11-19 ENCOUNTER — Encounter (HOSPITAL_COMMUNITY): Payer: Self-pay

## 2015-11-19 DIAGNOSIS — Z7984 Long term (current) use of oral hypoglycemic drugs: Secondary | ICD-10-CM | POA: Insufficient documentation

## 2015-11-19 DIAGNOSIS — Z794 Long term (current) use of insulin: Secondary | ICD-10-CM | POA: Insufficient documentation

## 2015-11-19 DIAGNOSIS — E119 Type 2 diabetes mellitus without complications: Secondary | ICD-10-CM | POA: Insufficient documentation

## 2015-11-19 DIAGNOSIS — R000 Tachycardia, unspecified: Secondary | ICD-10-CM | POA: Insufficient documentation

## 2015-11-19 DIAGNOSIS — F17210 Nicotine dependence, cigarettes, uncomplicated: Secondary | ICD-10-CM | POA: Insufficient documentation

## 2015-11-19 DIAGNOSIS — Z79899 Other long term (current) drug therapy: Secondary | ICD-10-CM | POA: Insufficient documentation

## 2015-11-19 DIAGNOSIS — I10 Essential (primary) hypertension: Secondary | ICD-10-CM | POA: Insufficient documentation

## 2015-11-19 DIAGNOSIS — R112 Nausea with vomiting, unspecified: Secondary | ICD-10-CM | POA: Insufficient documentation

## 2015-11-19 DIAGNOSIS — Z7982 Long term (current) use of aspirin: Secondary | ICD-10-CM | POA: Insufficient documentation

## 2015-11-19 DIAGNOSIS — R197 Diarrhea, unspecified: Secondary | ICD-10-CM | POA: Insufficient documentation

## 2015-11-19 DIAGNOSIS — E785 Hyperlipidemia, unspecified: Secondary | ICD-10-CM | POA: Insufficient documentation

## 2015-11-19 DIAGNOSIS — R109 Unspecified abdominal pain: Secondary | ICD-10-CM | POA: Insufficient documentation

## 2015-11-19 NOTE — ED Notes (Signed)
Pt seen here Tuesday night for abd pain and vomiting.  Pt states he continues to vomit tonight and the meds are not helping, unable to keep even sips of water down.

## 2015-11-20 ENCOUNTER — Emergency Department (HOSPITAL_COMMUNITY): Payer: 59

## 2015-11-20 ENCOUNTER — Emergency Department (HOSPITAL_COMMUNITY)
Admission: EM | Admit: 2015-11-20 | Discharge: 2015-11-20 | Disposition: A | Discharge status: Home / Self Care | Payer: 59 | Attending: Emergency Medicine | Admitting: Emergency Medicine

## 2015-11-20 DIAGNOSIS — R112 Nausea with vomiting, unspecified: Secondary | ICD-10-CM

## 2015-11-20 DIAGNOSIS — R197 Diarrhea, unspecified: Secondary | ICD-10-CM

## 2015-11-20 LAB — COMPREHENSIVE METABOLIC PANEL
ALBUMIN: 4.1 g/dL (ref 3.5–5.0)
ALT: 14 U/L — ABNORMAL LOW (ref 17–63)
AST: 13 U/L — AB (ref 15–41)
Alkaline Phosphatase: 74 U/L (ref 38–126)
Anion gap: 10 (ref 5–15)
BILIRUBIN TOTAL: 1.4 mg/dL — AB (ref 0.3–1.2)
BUN: 19 mg/dL (ref 6–20)
CHLORIDE: 105 mmol/L (ref 101–111)
CO2: 23 mmol/L (ref 22–32)
Calcium: 8.7 mg/dL — ABNORMAL LOW (ref 8.9–10.3)
Creatinine, Ser: 1.05 mg/dL (ref 0.61–1.24)
GFR calc Af Amer: >60 mL/min (ref >60)
GFR calc non Af Amer: >60 mL/min (ref >60)
GLUCOSE: 178 mg/dL — AB (ref 65–99)
POTASSIUM: 4.1 mmol/L (ref 3.5–5.1)
SODIUM: 138 mmol/L (ref 135–145)
TOTAL PROTEIN: 7.5 g/dL (ref 6.5–8.1)

## 2015-11-20 LAB — CBC WITH DIFFERENTIAL/PLATELET
BASOS ABS: 0 10*3/uL (ref 0.0–0.1)
BASOS PCT: 0 %
EOS ABS: 0.1 10*3/uL (ref 0.0–0.7)
Eosinophils Relative: 0 %
HEMATOCRIT: 52.5 % — AB (ref 39.0–52.0)
HEMOGLOBIN: 18.2 g/dL — AB (ref 13.0–17.0)
Lymphocytes Relative: 14 %
Lymphs Abs: 3.1 10*3/uL (ref 0.7–4.0)
MCH: 31 pg (ref 26.0–34.0)
MCHC: 34.7 g/dL (ref 30.0–36.0)
MCV: 89.3 fL (ref 78.0–100.0)
MONO ABS: 1.3 10*3/uL — AB (ref 0.1–1.0)
Monocytes Relative: 6 %
NEUTROS ABS: 17.1 10*3/uL — AB (ref 1.7–7.7)
NEUTROS PCT: 80 %
Platelets: 243 10*3/uL (ref 150–400)
RBC: 5.88 MIL/uL — ABNORMAL HIGH (ref 4.22–5.81)
RDW: 13.6 % (ref 11.5–15.5)
WBC: 21.5 10*3/uL — ABNORMAL HIGH (ref 4.0–10.5)

## 2015-11-20 LAB — LIPASE, BLOOD: Lipase: 16 U/L (ref 11–51)

## 2015-11-20 MED ORDER — DICYCLOMINE HCL 20 MG PO TABS: 20.0000 mg | ORAL_TABLET | Freq: Two times a day (BID) | ORAL | Status: DC

## 2015-11-20 MED ORDER — SODIUM CHLORIDE 0.9 % IV BOLUS (SEPSIS)
1000.0000 mL | Freq: Once | INTRAVENOUS | Status: AC
  Administered 2015-11-20: 1000 mL via INTRAVENOUS

## 2015-11-20 MED ORDER — METOCLOPRAMIDE HCL 10 MG PO TABS: 10.0000 mg | ORAL_TABLET | Freq: Four times a day (QID) | ORAL | Status: DC

## 2015-11-20 MED ORDER — DIPHENHYDRAMINE HCL 50 MG/ML IJ SOLN
25.0000 mg | Freq: Once | INTRAMUSCULAR | Status: AC
  Administered 2015-11-20: 25 mg via INTRAVENOUS

## 2015-11-20 MED ORDER — DIPHENOXYLATE-ATROPINE 2.5-0.025 MG PO TABS: 1.0000 | ORAL_TABLET | Freq: Four times a day (QID) | ORAL | Status: DC | PRN

## 2015-11-20 MED ORDER — DIPHENHYDRAMINE HCL 50 MG/ML IJ SOLN
INTRAMUSCULAR | Status: AC
  Filled 2015-11-20: qty 1

## 2015-11-20 MED ORDER — IOPAMIDOL (ISOVUE-300) INJECTION 61%
100.0000 mL | Freq: Once | INTRAVENOUS | Status: AC | PRN
  Administered 2015-11-20: 100 mL via INTRAVENOUS

## 2015-11-20 MED ORDER — METOCLOPRAMIDE HCL 5 MG/ML IJ SOLN
10.0000 mg | Freq: Once | INTRAMUSCULAR | Status: AC
  Administered 2015-11-20: 10 mg via INTRAVENOUS

## 2015-11-20 MED ORDER — METOCLOPRAMIDE HCL 5 MG/ML IJ SOLN
INTRAMUSCULAR | Status: AC
  Filled 2015-11-20: qty 2

## 2015-11-20 NOTE — Discharge Instructions (Signed)

## 2015-11-20 NOTE — ED Provider Notes (Signed)
CSN: [id number]     Arrival date & time 11/19/15  2312 History  By signing my name below, I, [name], attest that this documentation has been prepared under the direction and in the presence of Gilda Crease, MD. Electronically Signed: Angelene Giovanni, ED Scribe. 11/20/2015. 1:08 AM.    Chief Complaint  Patient presents with  . Emesis   Patient is a 45 y.o. male presenting with vomiting. The history is provided by the patient. No language interpreter was used.  Emesis Severity:  Moderate Timing:  Intermittent Progression:  Worsening Chronicity:  Recurrent Recent urination:  Normal Relieved by:  Nothing Worsened by:  Nothing tried Ineffective treatments:  None tried Associated symptoms: abdominal pain   Associated symptoms: no diarrhea and no fever   Risk factors: no sick contacts    HPI Comments: [name] [name] is a 45 y.o. male who presents to the Emergency Department complaining of gradually worsening moderate lower abdominal pain onset one week ago. He reports associated nausea with multiple episodes of non-bloody vomiting. He states that he has not been able to tolerate any liquids or solids. No alleviating factors noted. Pt was seen on 11/18/15 for these symptoms after visiting his PCP. Pt had a normal chest x-ray and abdomen x-ray at that time and discharged after IV fluids and antiemetic medication. He denies any sick contacts. No fever or diarrhea.    Past Medical History  Diagnosis Date  . Malaise and fatigue   . Hypertension   . Atypical chest pain   . Hyperlipemia   . Diabetes mellitus without complication [facility])    Past Surgical History  Procedure Laterality Date  . Appendectomy  1975  . Ankle surgery  1982  . Cardiac catheterization N/A 03/28/2015    Procedure: Left Heart Cath and Coronary Angiography;  Surgeon: Yates Decamp, MD;  Location[facility] INVASIVE CV LAB;  Service: Cardiovascular;  Laterality: N/A;  . Cardiac catheterization N/A 03/28/2015     Procedure: Intravascular Pressure Wire/FFR Study;  Surgeon: Yates Decamp, MD;  Location: [facility] INVASIVE CV LAB;  Service: Cardiovascular;  Laterality: N/A;   Family History  Problem Relation Age of Onset  . Congestive Heart Failure Sister   . Diabetes Mother   . Hypertension Mother   . Diabetes Father   . Hypertension Father    Social History  Substance Use Topics  . Smoking status: Current Every Day Smoker -- 1.00 packs/day for 29 years    Types: Cigarettes  . Smokeless tobacco: None  . Alcohol Use: No    Review of Systems  Constitutional: Negative for fever.  Gastrointestinal: Positive for nausea, vomiting and abdominal pain. Negative for diarrhea.  All other systems reviewed and are negative.     Allergies  Omeprazole magnesium  Home Medications   Prior to Admission medications   Medication Sig Start Date End Date Taking? Authorizing Provider  acetaminophen (TYLENOL) 500 MG tablet Take 500 mg by mouth every 6 (six) hours as needed.    Historical Provider, MD  ALPRAZolam (XANAX) 0.5 MG tablet TAKE 1/2 TO 1 TABLET BY MOUTH DAILY AT BEDTIME *MAY TAKE 3 TIMES DAILY AS NEEDED FOR ANXIETY 11/06/15   Historical Provider, MD  amoxicillin-clavulanate (AUGMENTIN) 875-125 MG tablet Take 1 tablet by mouth every 12 (twelve) hours. 10 day course starting on 11/13/2015 11/13/15   Historical Provider, MD  aspirin 81 MG chewable tablet Chew by mouth daily.    Historical Provider, MD  atorvastatin (LIPITOR) 40 MG tablet Take 40 mg by  mouth daily.  07/29/14   Historical Provider, MD  Empagliflozin-Linagliptin (GLYXAMBI) 10-5 MG TABS Take 1 tablet by mouth every evening.    Historical Provider, MD  Fenofibric Acid 105 MG TABS Take by mouth.    Historical Provider, MD  glimepiride (AMARYL) 4 MG tablet Take 4 mg by mouth daily with breakfast.    Historical Provider, MD  Insulin Degludec (TRESIBA FLEXTOUCH) 200 UNIT/ML SOPN Inject 18 Units into the skin at bedtime.     Historical Provider, MD  metFORMIN  (GLUCOPHAGE) 500 MG tablet Take 1 tablet (500 mg total) by mouth 2 (two) times daily with a meal. 03/30/15   Yates DecampJay Ganji, MD  metoprolol succinate (TOPROL-XL) 50 MG 24 hr tablet Take 50 mg by mouth daily. 10/28/15   Historical Provider, MD  nitroGLYCERIN (NITROSTAT) 0.4 MG SL tablet Place 0.4 mg under the tongue every 5 (five) minutes as needed for chest pain.    Historical Provider, MD  predniSONE (DELTASONE) 20 MG tablet Take 60 mg by mouth daily. 6 day course starting on 11/13/2015 11/13/15   Historical Provider, MD  promethazine (PHENERGAN) 25 MG tablet Take 1 tablet (25 mg total) by mouth every 6 (six) hours as needed for nausea or vomiting. 11/18/15   Glynn OctaveStephen Rancour, MD  sertraline (ZOLOFT) 50 MG tablet Take 100 mg by mouth at bedtime.     Historical Provider, MD  valsartan (DIOVAN) 80 MG tablet Take 80 mg by mouth daily.    Historical Provider, MD  varenicline (CHANTIX) 1 MG tablet Take 1 mg by mouth 2 (two) times daily.    Historical Provider, MD   BP 143/95 mmHg  Pulse 103  Temp(Src) 99.2 F (37.3 C) (Oral)  Resp 18  Ht 5\' 8"  (1.727 m)  Wt 181 lb (82.101 kg)  BMI 27.53 kg/m2  SpO2 98% Physical Exam  Constitutional: He is oriented to person, place, and time. He appears well-developed and well-nourished. No distress.  HENT:  Head: Normocephalic and atraumatic.  Right Ear: Hearing normal.  Left Ear: Hearing normal.  Nose: Nose normal.  Mouth/Throat: Oropharynx is clear and moist and mucous membranes are normal.  Eyes: Conjunctivae and EOM are normal. Pupils are equal, round, and reactive to light.  Neck: Normal range of motion. Neck supple.  Cardiovascular: Regular rhythm, S1 normal and S2 normal.  Tachycardia present.  Exam reveals no gallop and no friction rub.   No murmur heard. Mild tachycardia  Pulmonary/Chest: Effort normal and breath sounds normal. No respiratory distress. He exhibits no tenderness.  Abdominal: Soft. Normal appearance and bowel sounds are normal. There is no  hepatosplenomegaly. There is tenderness. There is no rebound, no guarding, no tenderness at McBurney's point and negative Murphy's sign. No hernia.  Diffuse lower abdominal tenderness  Musculoskeletal: Normal range of motion.  Neurological: He is alert and oriented to person, place, and time. He has normal strength. No cranial nerve deficit or sensory deficit. Coordination normal. GCS eye subscore is 4. GCS verbal subscore is 5. GCS motor subscore is 6.  Skin: Skin is warm, dry and intact. No rash noted. No cyanosis.  Psychiatric: He has a normal mood and affect. His speech is normal and behavior is normal. Thought content normal.  Nursing note and vitals reviewed.   ED Course  Procedures (including critical care time) DIAGNOSTIC STUDIES: Oxygen Saturation is 98% on RA, normal by my interpretation.    COORDINATION OF CARE: 1:06 AM- Pt advised of plan for treatment and pt agrees. Pt will receive CT abdomen  and lab work for further evaluation. He will also receive IV fluids, Reglan, and Benadryl.    Labs Review Labs Reviewed - No data to display  Imaging Review Dg Chest 2 View  11/18/2015  CLINICAL DATA:  Acute onset of congestion and shortness of breath. Vomiting. Coughing and gagging. Initial encounter. EXAM: CHEST  2 VIEW COMPARISON:  Chest radiograph performed 07/29/2014 FINDINGS: The lungs are well-aerated and clear. There is no evidence of focal opacification, pleural effusion or pneumothorax. The heart is normal in size; the mediastinal contour is within normal limits. No acute osseous abnormalities are seen. IMPRESSION: No acute cardiopulmonary process seen. Electronically Signed   By: Roanna RaiderJeffery  Chang M.D.   On: 11/18/2015 21:19   Dg Abd 2 Views  11/18/2015  CLINICAL DATA:  Pt c/o productive cough, congestion, N/V, and ABD pain x 1 wk. CXR earlier tonight. Hx HTN, diabetes, appendectomy, smoker EXAM: ABDOMEN - 2 VIEW COMPARISON:  Chest from earlier the same day FINDINGS: Visualized lung  bases clear. No free air. Normal bowel gas pattern. No abnormal abdominal calcifications. Regional bones unremarkable. IMPRESSION: Negative. Electronically Signed   By: Corlis Leak  Hassell M.D.   On: 11/18/2015 22:54   Gilda Creasehristopher J Pollina, MD has personally reviewed and evaluated these images and lab results as part of his medical decision-making.   EKG Interpretation None      MDM   Final diagnoses:  None  Vomiting Diarrhea  Patient seen for nausea, vomiting, diarrhea. Patient was seen by his primary doctor as well as in the ER for this in the last couple of days. He continues to have lower abdominal pain. He did have diffuse tenderness without obvious acute surgical process present on examination. He has a leukocytosis which was seen previously. Patient underwent CT scan to further evaluate. No acute abnormality is noted. Patient feeling better after IV fluids and medications. We'll continue outpatient support, follow-up with his GI doctor, Dr. Dulce Sellaroutlaw.  I personally performed the services described in this documentation, which was scribed in my presence. The recorded information has been reviewed and is accurate.    Gilda Creasehristopher J Pollina, MD 11/20/15 [phone]

## 2015-11-20 NOTE — ED Notes (Signed)
Pt reports 40 episodes of V/ today. States it began 3 days ago, not able to tolerate PO fluids.

## 2015-11-23 ENCOUNTER — Emergency Department (HOSPITAL_COMMUNITY): Payer: 59

## 2015-11-23 ENCOUNTER — Emergency Department (HOSPITAL_COMMUNITY)
Admission: EM | Admit: 2015-11-23 | Discharge: 2015-11-23 | Discharge status: Against Medical Advice | Payer: 59 | Attending: Emergency Medicine | Admitting: Emergency Medicine

## 2015-11-23 ENCOUNTER — Encounter (HOSPITAL_COMMUNITY): Payer: Self-pay | Admitting: *Deleted

## 2015-11-23 ENCOUNTER — Other Ambulatory Visit: Payer: Self-pay

## 2015-11-23 DIAGNOSIS — Z7984 Long term (current) use of oral hypoglycemic drugs: Secondary | ICD-10-CM | POA: Diagnosis not present

## 2015-11-23 DIAGNOSIS — E119 Type 2 diabetes mellitus without complications: Secondary | ICD-10-CM | POA: Diagnosis not present

## 2015-11-23 DIAGNOSIS — R1013 Epigastric pain: Secondary | ICD-10-CM | POA: Diagnosis not present

## 2015-11-23 DIAGNOSIS — R05 Cough: Secondary | ICD-10-CM | POA: Insufficient documentation

## 2015-11-23 DIAGNOSIS — I10 Essential (primary) hypertension: Secondary | ICD-10-CM | POA: Insufficient documentation

## 2015-11-23 DIAGNOSIS — F17210 Nicotine dependence, cigarettes, uncomplicated: Secondary | ICD-10-CM | POA: Insufficient documentation

## 2015-11-23 DIAGNOSIS — R059 Cough, unspecified: Secondary | ICD-10-CM

## 2015-11-23 DIAGNOSIS — Z79899 Other long term (current) drug therapy: Secondary | ICD-10-CM | POA: Diagnosis not present

## 2015-11-23 DIAGNOSIS — Z794 Long term (current) use of insulin: Secondary | ICD-10-CM | POA: Insufficient documentation

## 2015-11-23 DIAGNOSIS — Z7982 Long term (current) use of aspirin: Secondary | ICD-10-CM | POA: Diagnosis not present

## 2015-11-23 DIAGNOSIS — R079 Chest pain, unspecified: Secondary | ICD-10-CM | POA: Diagnosis present

## 2015-11-23 LAB — COMPREHENSIVE METABOLIC PANEL
ALK PHOS: 66 U/L (ref 38–126)
ALT: 12 U/L — AB (ref 17–63)
AST: 14 U/L — ABNORMAL LOW (ref 15–41)
Albumin: 3.3 g/dL — ABNORMAL LOW (ref 3.5–5.0)
Anion gap: 7 (ref 5–15)
BILIRUBIN TOTAL: 0.8 mg/dL (ref 0.3–1.2)
BUN: 13 mg/dL (ref 6–20)
CALCIUM: 8.5 mg/dL — AB (ref 8.9–10.3)
CO2: 20 mmol/L — AB (ref 22–32)
CREATININE: 1.01 mg/dL (ref 0.61–1.24)
Chloride: 108 mmol/L (ref 101–111)
Glucose, Bld: 162 mg/dL — ABNORMAL HIGH (ref 65–99)
Potassium: 4.1 mmol/L (ref 3.5–5.1)
Sodium: 135 mmol/L (ref 135–145)
TOTAL PROTEIN: 6.2 g/dL — AB (ref 6.5–8.1)

## 2015-11-23 LAB — URINE MICROSCOPIC-ADD ON
BACTERIA UA: NONE SEEN
RBC / HPF: NONE SEEN RBC/hpf (ref 0–5)
WBC UA: NONE SEEN WBC/hpf (ref 0–5)

## 2015-11-23 LAB — CBC
HCT: 49.1 % (ref 39.0–52.0)
Hemoglobin: 16.7 g/dL (ref 13.0–17.0)
MCH: 30.3 pg (ref 26.0–34.0)
MCHC: 34 g/dL (ref 30.0–36.0)
MCV: 88.9 fL (ref 78.0–100.0)
PLATELETS: 243 10*3/uL (ref 150–400)
RBC: 5.52 MIL/uL (ref 4.22–5.81)
RDW: 13.1 % (ref 11.5–15.5)
WBC: 22.9 10*3/uL — ABNORMAL HIGH (ref 4.0–10.5)

## 2015-11-23 LAB — I-STAT TROPONIN, ED: Troponin i, poc: 0.05 ng/mL (ref 0.00–0.08)

## 2015-11-23 LAB — URINALYSIS, ROUTINE W REFLEX MICROSCOPIC
BILIRUBIN URINE: NEGATIVE
Glucose, UA: >1000 mg/dL — AB
HGB URINE DIPSTICK: NEGATIVE
Ketones, ur: NEGATIVE mg/dL
Leukocytes, UA: NEGATIVE
NITRITE: NEGATIVE
PROTEIN: NEGATIVE mg/dL
Specific Gravity, Urine: 1.028 (ref 1.005–1.030)
pH: 5.5 (ref 5.0–8.0)

## 2015-11-23 LAB — LIPASE, BLOOD: Lipase: 17 U/L (ref 11–51)

## 2015-11-23 LAB — I-STAT CG4 LACTIC ACID, ED
LACTIC ACID, VENOUS: 1.79 mmol/L (ref 0.5–2.0)
Lactic Acid, Venous: 1.5 mmol/L (ref 0.5–2.0)

## 2015-11-23 MED ORDER — ACETAMINOPHEN 500 MG PO TABS
1000.0000 mg | ORAL_TABLET | Freq: Once | ORAL | Status: DC
  Filled 2015-11-23: qty 2

## 2015-11-23 MED ORDER — ONDANSETRON 4 MG PO TBDP
4.0000 mg | ORAL_TABLET | Freq: Once | ORAL | Status: AC
  Administered 2015-11-23: 4 mg via ORAL
  Filled 2015-11-23: qty 1

## 2015-11-23 MED ORDER — ALUM & MAG HYDROXIDE-SIMETH 200-200-20 MG/5ML PO SUSP
15.0000 mL | Freq: Once | ORAL | Status: DC
  Filled 2015-11-23: qty 30

## 2015-11-23 MED ORDER — ONDANSETRON HCL 4 MG/2ML IJ SOLN: 4.0000 mg | Freq: Once | INTRAMUSCULAR | Status: DC

## 2015-11-23 MED ORDER — ONDANSETRON 4 MG PO TBDP: 4.0000 mg | ORAL_TABLET | Freq: Three times a day (TID) | ORAL | Status: DC | PRN

## 2015-11-23 MED ORDER — LIDOCAINE VISCOUS 2 % MT SOLN
15.0000 mL | Freq: Once | OROMUCOSAL | Status: DC
  Filled 2015-11-23: qty 15

## 2015-11-23 MED ORDER — BENZONATATE 100 MG PO CAPS: 100.0000 mg | ORAL_CAPSULE | Freq: Three times a day (TID) | ORAL | Status: DC

## 2015-11-23 NOTE — ED Notes (Signed)
Urine specimen sent.  Pt did not wish to wait for results.  PA made aware and d/c papers provided.  PT did  Not wish to sign.

## 2015-11-23 NOTE — ED Notes (Signed)
Pt reports burning sensation in chest that radiates into his throat. Has lower abd pain and back pain. Has been to [facility] twice in the past week for same and no diagnosis made, was started on antibiotics. Pt reports n/v. ekg done at triage, no acute distress noted.

## 2015-11-23 NOTE — ED Provider Notes (Signed)
CSN: [id number]     Arrival date & time 11/23/15  1456 History   First MD Initiated Contact with Patient 11/23/15 1714     Chief Complaint  Patient presents with  . Abdominal Pain  . Chest Pain   HPI Patient reports burning sensation in chest that radiates to his throat and lower epigastric area. He states it is not stated with shortness of breath although he does feel short of breath and has been treated for a COPD exacerbation with Augmentin and prednisone, which he is still taking and tolerating by mouth. He states he has abdominal pain but no dysuria, back pain, throat pain, chest pain. He states he's had some fevers and diarrhea and vomiting. He is no longer having diarrhea but continues to vomit. He states he feels like he is dehydrated. He isn't evaluated multiple times for same, on Wednesday by his primary care provider, multiple ER visits where he's had a negative CT of his abdomen and pelvis and MRI of his head. Patient states that his symptoms are not worse today and he is not feeling better so he came to be reevaluated.  Past Medical History  Diagnosis Date  . Malaise and fatigue   . Hypertension   . Atypical chest pain   . Hyperlipemia   . Diabetes mellitus without complication [facility])    Past Surgical History  Procedure Laterality Date  . Appendectomy  1975  . Ankle surgery  1982  . Cardiac catheterization N/A 03/28/2015    Procedure: Left Heart Cath and Coronary Angiography;  Surgeon: Yates Decamp, MD;  Location: [facility] INVASIVE CV LAB;  Service: Cardiovascular;  Laterality: N/A;  . Cardiac catheterization N/A 03/28/2015    Procedure: Intravascular Pressure Wire/FFR Study;  Surgeon: Yates Decamp, MD;  Location: [facility] INVASIVE CV LAB;  Service: Cardiovascular;  Laterality: N/A;   Family History  Problem Relation Age of Onset  . Congestive Heart Failure Sister   . Diabetes Mother   . Hypertension Mother   . Diabetes Father   . Hypertension Father    Social History  Substance Use  Topics  . Smoking status: Current Every Day Smoker -- 1.00 packs/day for 29 years    Types: Cigarettes  . Smokeless tobacco: None  . Alcohol Use: No    Review of Systems  Constitutional: Positive for fever.  Allergic/Immunologic: Negative for immunocompromised state.  All other systems reviewed and are negative.  Allergies  Omeprazole magnesium  Home Medications   Prior to Admission medications   Medication Sig Start Date End Date Taking? Authorizing Provider  acetaminophen (TYLENOL) 500 MG tablet Take 500 mg by mouth every 6 (six) hours as needed for mild pain or moderate pain.     Historical Provider, MD  ALPRAZolam (XANAX) 0.5 MG tablet TAKE 1/2 TO 1 TABLET BY MOUTH DAILY AT BEDTIME *MAY TAKE 3 TIMES DAILY AS NEEDED FOR ANXIETY 11/06/15   Historical Provider, MD  amoxicillin-clavulanate (AUGMENTIN) 875-125 MG tablet Take 1 tablet by mouth every 12 (twelve) hours. 10 day course starting on 11/13/2015 11/13/15   Historical Provider, MD  aspirin 81 MG chewable tablet Chew by mouth daily.    Historical Provider, MD  atorvastatin (LIPITOR) 40 MG tablet Take 40 mg by mouth daily.  07/29/14   Historical Provider, MD  azithromycin (ZITHROMAX) 250 MG tablet Take 250-500 mg by mouth See admin instructions. Reported on 11/25/2015 11/18/15   Historical Provider, MD  benzonatate (TESSALON) 100 MG capsule Take 1 capsule (100 mg total) by mouth every  8 (eight) hours. 11/23/15   Sidney AceAlison Charruf Iris Tatsch, MD  dicyclomine (BENTYL) 20 MG tablet Take 1 tablet (20 mg total) by mouth 2 (two) times daily. 11/20/15   Gilda Creasehristopher J Pollina, MD  diphenoxylate-atropine (LOMOTIL) 2.5-0.025 MG tablet Take 1 tablet by mouth 4 (four) times daily as needed for diarrhea or loose stools. 11/20/15   Gilda Creasehristopher J Pollina, MD  Empagliflozin-Linagliptin (GLYXAMBI) 10-5 MG TABS Take 1 tablet by mouth every evening.    Historical Provider, MD  Fenofibric Acid 105 MG TABS Take 1 tablet by mouth daily.     Historical Provider, MD   glimepiride (AMARYL) 4 MG tablet Take 4 mg by mouth daily with breakfast.    Historical Provider, MD  Insulin Degludec (TRESIBA FLEXTOUCH) 200 UNIT/ML SOPN Inject 18 Units into the skin at bedtime.     Historical Provider, MD  metFORMIN (GLUCOPHAGE) 500 MG tablet Take 1 tablet (500 mg total) by mouth 2 (two) times daily with a meal. 03/30/15   Yates DecampJay Ganji, MD  metoCLOPramide (REGLAN) 10 MG tablet Take 1 tablet (10 mg total) by mouth every 6 (six) hours. 11/20/15   Gilda Creasehristopher J Pollina, MD  metoprolol succinate (TOPROL-XL) 50 MG 24 hr tablet Take 50 mg by mouth daily. 10/28/15   Historical Provider, MD  nitroGLYCERIN (NITROSTAT) 0.4 MG SL tablet Place 0.4 mg under the tongue every 5 (five) minutes as needed for chest pain.    Historical Provider, MD  ondansetron (ZOFRAN ODT) 4 MG disintegrating tablet Take 1 tablet (4 mg total) by mouth every 8 (eight) hours as needed for nausea or vomiting. 11/23/15   Sidney AceAlison Charruf Xavior Niazi, MD  promethazine (PHENERGAN) 25 MG tablet Take 1 tablet (25 mg total) by mouth every 6 (six) hours as needed for nausea or vomiting. 11/18/15   Glynn OctaveStephen Rancour, MD  sertraline (ZOLOFT) 50 MG tablet Take 100 mg by mouth at bedtime.     Historical Provider, MD  valsartan (DIOVAN) 80 MG tablet Take 80 mg by mouth daily.    Historical Provider, MD  varenicline (CHANTIX) 1 MG tablet Take 1 mg by mouth 2 (two) times daily.    Historical Provider, MD   BP 109/83 mmHg  Pulse 85  Temp(Src) 100.3 F (37.9 C)  Resp 18  SpO2 99% Physical Exam  Constitutional: He appears well-developed and well-nourished. No distress.  HENT:  Head: Normocephalic and atraumatic.  Right Ear: External ear normal.  Left Ear: External ear normal.  Mouth/Throat: Oropharynx is clear and moist. No oropharyngeal exudate.  Eyes: Conjunctivae are normal. Pupils are equal, round, and reactive to light. Right eye exhibits no discharge. Left eye exhibits no discharge.  Neck: Normal range of motion. Neck supple.   Cardiovascular: Normal rate, regular rhythm and intact distal pulses.   No murmur heard. Patient intermittently becomes tachycardic to the 120s but once he calms down he is steady in the 80s  Pulmonary/Chest: Effort normal and breath sounds normal. No respiratory distress. He has no wheezes. He has no rales. He exhibits no tenderness.  Abdominal: Soft. Bowel sounds are normal. He exhibits no distension and no mass. There is no tenderness. There is no rebound and no guarding.  Completely NT abdomen if distracted, however is patient is not distracted, he starts to guard diffusely  Musculoskeletal: He exhibits no edema.  Neurological: He is alert.  Skin: Skin is warm. No rash noted. He is not diaphoretic. No erythema.  Psychiatric: He has a normal mood and affect.    ED Course  Procedures (  including critical care time) Labs Review Labs Reviewed  COMPREHENSIVE METABOLIC PANEL - Abnormal; Notable for the following:    CO2 20 (*)    Glucose, Bld 162 (*)    Calcium 8.5 (*)    Total Protein 6.2 (*)    Albumin 3.3 (*)    AST 14 (*)    ALT 12 (*)    All other components within normal limits  CBC - Abnormal; Notable for the following:    WBC 22.9 (*)    All other components within normal limits  URINALYSIS, ROUTINE W REFLEX MICROSCOPIC (NOT AT [facility]) - Abnormal; Notable for the following:    Glucose, UA >1000 (*)    All other components within normal limits  URINE MICROSCOPIC-ADD ON - Abnormal; Notable for the following:    Squamous Epithelial / LPF 0-5 (*)    All other components within normal limits  URINE CULTURE  LIPASE, BLOOD  I-STAT CG4 LACTIC ACID, ED  I-STAT CG4 LACTIC ACID, ED  Rosezena SensorI-STAT TROPOININ, ED    Imaging Review Ct Maxillofacial W Contrast  11/26/2015  CLINICAL DATA:  Recent diagnosis of dental abscess with subsequent treatment. Leukocytosis and continued dental pain. Intermittent vomiting for 1 week. History of diabetes. EXAM: CT MAXILLOFACIAL WITH CONTRAST TECHNIQUE:  Multidetector CT imaging of the maxillofacial structures was performed with intravenous contrast. Multiplanar CT image reconstructions were also generated. A small metallic BB was placed on the right temple in order to reliably differentiate right from left. CONTRAST:  75mL ISOVUE-300 IOPAMIDOL (ISOVUE-300) INJECTION 61% COMPARISON:  None. FINDINGS: FACIAL BONES: The mandible is intact, the condyles are located. No acute facial fracture. No destructive bony lesions. Multiple absent teeth. Scattered dental caries. Probable bone island RIGHT mandible body. SINUSES: RIGHT maxillary sinus air-fluid level. Scattered sub cm mucosal retention cyst. Mastoid air cells are well aerated. Nasal septum slightly deviated to the RIGHT. Soft tissue effaces the RIGHT ostiomeatal unit. ORBITS: Ocular globes and orbital contents are unremarkable. SOFT TISSUES: No significant soft tissue swelling. No subcutaneous gas or radiopaque foreign bodies. Eccentric calcific atherosclerosis results in at least mild to moderate stenosis RIGHT internal carotid artery origin though not tailored for evaluation. IMPRESSION: Multiple absent teeth and dental caries, no dental abscess. Acute RIGHT maxillary sinusitis. At least mild to moderate RIGHT internal carotid artery stenosis, not tailored for evaluation. Electronically Signed   By: Awilda Metroourtnay  Bloomer M.D.   On: 11/26/2015 00:59   I have personally reviewed and evaluated these images and lab results as part of my medical decision-making.   EKG Interpretation   Date/Time:  Sunday November 23 2015 14:57:43 EDT Ventricular Rate:  122 PR Interval:  130 QRS Duration: 120 QT Interval:  336 QTC Calculation: 478 R Axis:   110 Text Interpretation:  Sinus tachycardia Right bundle branch block Left  posterior fascicular block Bifascicular block Abnormal ECG  Confirmed by Lincoln Brighamees, Liz [phone]([id number]) on 11/23/2015 3:06:51 PM      MDM   Final diagnoses:  Cough   Patient symptoms appear to be chronic.  Patient states he actually had them for over a year, requiring endoscopy, colonoscopy which have been negative. He's also had an MRI of his head which has been negative, CT abdomen and pelvis which has been negative. He states his symptoms are no different than usual but states continued severe. He states he is vomiting multiple times but during my entire evaluation patient has not vomited once and instead is coughing and spitting up clear saliva. Patient is currently being treated  for a COPD exacerbation after he reports his primary care provider noted he was wheezing and short of breath. Patient has white blood cell count elevation which I suspect is secondary to the steroids. Doubt endocarditis as patient has no murmur and does not have a fever here. He denies any IV drug use as well and does not have any other significant risk factors. Doubt occult infection such as bacteremia from an occult source of patient has no evidence of rashes. Patient is well-appearing with no hemodynamic instability. Labs reviewed and urine reviewed and unremarkable. Troponin is negative. Doubt ACS. EKG not consistent with MI.   6:30p - pt reassessed, states he is still having difficulty tolerating po. He is spitting up but no gastric contents noted. Went to bring a glass of water to rechallange, pt tolerated without difficulty.   Patient initially stated that he would like to leave AMA. Was able to convince patient to wait for his urine which was negative. We'll send for culture given patients request. Patient to follow up with primary care provider. Strict return precautions given. Patient discharged in good condition.  Sidney Ace, MD 11/27/15 1521  Eber Hong, MD 11/30/15 [phone]

## 2015-11-23 NOTE — ED Notes (Signed)
Pt refusing medications and reports he wishes to leave AMA.  Resident made aware.

## 2015-11-24 LAB — URINE CULTURE
Culture: NO GROWTH
Special Requests: NORMAL

## 2015-11-25 ENCOUNTER — Encounter (HOSPITAL_COMMUNITY): Payer: Self-pay | Admitting: *Deleted

## 2015-11-25 ENCOUNTER — Observation Stay (HOSPITAL_COMMUNITY): Payer: 59

## 2015-11-25 ENCOUNTER — Observation Stay (HOSPITAL_COMMUNITY)
Admission: EM | Admit: 2015-11-25 | Discharge: 2015-11-28 | Disposition: A | Discharge status: Home / Self Care | Payer: 59 | Attending: Internal Medicine | Admitting: Internal Medicine

## 2015-11-25 DIAGNOSIS — E785 Hyperlipidemia, unspecified: Secondary | ICD-10-CM | POA: Insufficient documentation

## 2015-11-25 DIAGNOSIS — R739 Hyperglycemia, unspecified: Secondary | ICD-10-CM

## 2015-11-25 DIAGNOSIS — K047 Periapical abscess without sinus: Secondary | ICD-10-CM | POA: Diagnosis not present

## 2015-11-25 DIAGNOSIS — Z79899 Other long term (current) drug therapy: Secondary | ICD-10-CM | POA: Insufficient documentation

## 2015-11-25 DIAGNOSIS — E860 Dehydration: Principal | ICD-10-CM | POA: Insufficient documentation

## 2015-11-25 DIAGNOSIS — Z794 Long term (current) use of insulin: Secondary | ICD-10-CM | POA: Diagnosis not present

## 2015-11-25 DIAGNOSIS — F17210 Nicotine dependence, cigarettes, uncomplicated: Secondary | ICD-10-CM | POA: Insufficient documentation

## 2015-11-25 DIAGNOSIS — E119 Type 2 diabetes mellitus without complications: Secondary | ICD-10-CM | POA: Diagnosis not present

## 2015-11-25 DIAGNOSIS — E11649 Type 2 diabetes mellitus with hypoglycemia without coma: Secondary | ICD-10-CM | POA: Diagnosis present

## 2015-11-25 DIAGNOSIS — I10 Essential (primary) hypertension: Secondary | ICD-10-CM | POA: Diagnosis not present

## 2015-11-25 DIAGNOSIS — Z7984 Long term (current) use of oral hypoglycemic drugs: Secondary | ICD-10-CM | POA: Insufficient documentation

## 2015-11-25 DIAGNOSIS — E869 Volume depletion, unspecified: Secondary | ICD-10-CM | POA: Diagnosis present

## 2015-11-25 DIAGNOSIS — E1165 Type 2 diabetes mellitus with hyperglycemia: Secondary | ICD-10-CM | POA: Diagnosis present

## 2015-11-25 DIAGNOSIS — D72829 Elevated white blood cell count, unspecified: Secondary | ICD-10-CM | POA: Diagnosis not present

## 2015-11-25 DIAGNOSIS — R05 Cough: Secondary | ICD-10-CM | POA: Insufficient documentation

## 2015-11-25 DIAGNOSIS — R1115 Cyclical vomiting syndrome unrelated to migraine: Secondary | ICD-10-CM

## 2015-11-25 DIAGNOSIS — Z7982 Long term (current) use of aspirin: Secondary | ICD-10-CM | POA: Diagnosis not present

## 2015-11-25 DIAGNOSIS — I209 Angina pectoris, unspecified: Secondary | ICD-10-CM

## 2015-11-25 DIAGNOSIS — R112 Nausea with vomiting, unspecified: Secondary | ICD-10-CM | POA: Diagnosis present

## 2015-11-25 LAB — COMPREHENSIVE METABOLIC PANEL
ALBUMIN: 4.3 g/dL (ref 3.5–5.0)
ALT: 14 U/L — ABNORMAL LOW (ref 17–63)
AST: 16 U/L (ref 15–41)
Alkaline Phosphatase: 82 U/L (ref 38–126)
Anion gap: 17 — ABNORMAL HIGH (ref 5–15)
BILIRUBIN TOTAL: 1.2 mg/dL (ref 0.3–1.2)
BUN: 20 mg/dL (ref 6–20)
CHLORIDE: 96 mmol/L — AB (ref 101–111)
CO2: 25 mmol/L (ref 22–32)
Calcium: 9.3 mg/dL (ref 8.9–10.3)
Creatinine, Ser: 1.24 mg/dL (ref 0.61–1.24)
GFR calc Af Amer: >60 mL/min (ref >60)
GFR calc non Af Amer: >60 mL/min (ref >60)
GLUCOSE: 134 mg/dL — AB (ref 65–99)
POTASSIUM: 4.1 mmol/L (ref 3.5–5.1)
Sodium: 138 mmol/L (ref 135–145)
TOTAL PROTEIN: 7.7 g/dL (ref 6.5–8.1)

## 2015-11-25 LAB — URINE MICROSCOPIC-ADD ON: RBC / HPF: NONE SEEN RBC/hpf (ref 0–5)

## 2015-11-25 LAB — CBG MONITORING, ED: Glucose-Capillary: 123 mg/dL — ABNORMAL HIGH (ref 65–99)

## 2015-11-25 LAB — CBC
HEMATOCRIT: 54.2 % — AB (ref 39.0–52.0)
Hemoglobin: 19.1 g/dL — ABNORMAL HIGH (ref 13.0–17.0)
MCH: 31.6 pg (ref 26.0–34.0)
MCHC: 35.2 g/dL (ref 30.0–36.0)
MCV: 89.6 fL (ref 78.0–100.0)
Platelets: 257 10*3/uL (ref 150–400)
RBC: 6.05 MIL/uL — ABNORMAL HIGH (ref 4.22–5.81)
RDW: 13 % (ref 11.5–15.5)
WBC: 27.2 10*3/uL — ABNORMAL HIGH (ref 4.0–10.5)

## 2015-11-25 LAB — URINALYSIS, ROUTINE W REFLEX MICROSCOPIC
BILIRUBIN URINE: NEGATIVE
Glucose, UA: >1000 mg/dL — AB
Hgb urine dipstick: NEGATIVE
Ketones, ur: 15 mg/dL — AB
LEUKOCYTES UA: NEGATIVE
NITRITE: NEGATIVE
PH: 5.5 (ref 5.0–8.0)
SPECIFIC GRAVITY, URINE: 1.015 (ref 1.005–1.030)

## 2015-11-25 LAB — LIPASE, BLOOD: Lipase: 13 U/L (ref 11–51)

## 2015-11-25 MED ORDER — IOPAMIDOL (ISOVUE-300) INJECTION 61%
75.0000 mL | Freq: Once | INTRAVENOUS | Status: AC | PRN
  Administered 2015-11-26: 75 mL via INTRAVENOUS

## 2015-11-25 MED ORDER — SODIUM CHLORIDE 0.9 % IV BOLUS (SEPSIS)
1000.0000 mL | Freq: Once | INTRAVENOUS | Status: AC
  Administered 2015-11-25: 1000 mL via INTRAVENOUS

## 2015-11-25 MED ORDER — PROMETHAZINE HCL 25 MG/ML IJ SOLN
25.0000 mg | Freq: Once | INTRAMUSCULAR | Status: AC
  Administered 2015-11-25: 25 mg via INTRAVENOUS
  Filled 2015-11-25: qty 1

## 2015-11-25 MED ORDER — ONDANSETRON HCL 4 MG/2ML IJ SOLN
4.0000 mg | Freq: Once | INTRAMUSCULAR | Status: AC
  Administered 2015-11-25: 4 mg via INTRAVENOUS
  Filled 2015-11-25: qty 2

## 2015-11-25 MED ORDER — ONDANSETRON 4 MG PO TBDP
4.0000 mg | ORAL_TABLET | Freq: Once | ORAL | Status: AC | PRN
  Administered 2015-11-25: 4 mg via ORAL
  Filled 2015-11-25: qty 1

## 2015-11-25 MED ORDER — GI COCKTAIL ~~LOC~~
30.0000 mL | Freq: Once | ORAL | Status: AC
  Administered 2015-11-25: 30 mL via ORAL
  Filled 2015-11-25: qty 30

## 2015-11-25 MED ORDER — FAMOTIDINE IN NACL 20-0.9 MG/50ML-% IV SOLN
20.0000 mg | INTRAVENOUS | Status: AC
  Administered 2015-11-25: 20 mg via INTRAVENOUS
  Filled 2015-11-25: qty 50

## 2015-11-25 NOTE — ED Notes (Signed)
Pt returns to er for further evaluation of vomiting, reports that he was [facility] and at [place] both, continues to vomit, has not been able to fill the zofran prescription and unable to keep phenergan down,

## 2015-11-25 NOTE — ED Provider Notes (Signed)
CSN: [id number]     Arrival date & time 11/25/15  1901 History  By signing my name below, I, [name] [name], attest that this documentation has been prepared under the direction and in the presence of [name] HongBrian Tenae Graziosi, MD.  Electronically Signed: Rosario AdieWilliam Andrew [name], ED Scribe. 11/25/2015. 9:36 PM.   Chief Complaint  Patient presents with  . Emesis   The history is provided by the patient. No language interpreter was used.   HPI Comments: [name] [name] is a 45 y.o. male with a PMHx significant for HTN, HLD, and DM who presents to the Emergency Department complaining of gradually worsening, intermittent episodes of emesis onset 1 week PTA. At the onset of his symptoms pt was seen by his PCP's office, and received an unspecified course of antibiotics. His symptoms did not change after beginning the course of antibiotics, and returned to his PCP's office where they then perscribed him with another course of antibiotics which he states was a 250mg  "red pill". He was also given Zofran at his second appointment which has not alleviated his episodes of emesis. His nausea and vomiting are aggravated after eating foods. Pt has also been seen in the ED multiple times in the past two weeks for similar symptoms and reports that nothing that has been given to has relieved his vomiting episodes. No other OTC medications or home remedies tried PTA. Pt does report that he found a tick on his left wrist approximately 1 month ago. 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 He denies illicit drug usage or alcohol usage. He does currently smoke cigarettes. Denies any recent travel outside of the country. Pt has a PSHx of colon polyp removal and colonoscopy.  He has associated intermittent blurry vision and chills. He denies fever or rash.    PCP-LONG,SCOTT, PA-C   Past Medical History  Diagnosis Date  . Malaise and fatigue   . Hypertension   . Atypical  chest pain   . Hyperlipemia   . Diabetes mellitus without complication [name] [name](HCC)    Past Surgical History  Procedure Laterality Date  . Appendectomy  1975  . Ankle surgery  1982  . Cardiac catheterization N/A 03/28/2015    Procedure: Left Heart Cath and Coronary Angiography;  Surgeon: [name] DecampJay Ganji, MD;  [facility];  Service: Cardiovascular;  Laterality: N/A;  . Cardiac catheterization N/A 03/28/2015    Procedure: Intravascular Pressure Wire/FFR Study;  Surgeon: [name] DecampJay Ganji, MD;  [facility];  Service: Cardiovascular;  Laterality: N/A;   Family History  Problem Relation Age of Onset  . Congestive Heart Failure Sister   . Diabetes Mother   . Hypertension Mother   . Diabetes Father   . Hypertension Father    Social History  Substance Use Topics  . Smoking status: Current Every Day Smoker -- 1.00 packs/day for 29 years    Types: Cigarettes  . Smokeless tobacco: None  . Alcohol Use: No    Review of Systems  Constitutional: Negative for fever.  Eyes: Positive for visual disturbance.  Respiratory: Positive for cough.   Gastrointestinal: Positive for nausea and vomiting.    Allergies  Omeprazole magnesium  Home Medications   Prior to Admission medications   Medication Sig Start Date End Date Taking? Authorizing Provider  acetaminophen (TYLENOL) 500 MG tablet Take 500 mg by mouth every 6 (six) hours as needed.    Historical Provider, MD  ALPRAZolam (  XANAX) 0.5 MG tablet TAKE 1/2 TO 1 TABLET BY MOUTH DAILY AT BEDTIME *MAY TAKE 3 TIMES DAILY AS NEEDED FOR ANXIETY 11/06/15   Historical Provider, MD  amoxicillin-clavulanate (AUGMENTIN) 875-125 MG tablet Take 1 tablet by mouth every 12 (twelve) hours. 10 day course starting on 11/13/2015 11/13/15   Historical Provider, MD  aspirin 81 MG chewable tablet Chew by mouth daily.    Historical Provider, MD  atorvastatin (LIPITOR) 40 MG tablet Take 40 mg by mouth daily.  07/29/14   Historical Provider, MD  benzonatate  (TESSALON) 100 MG capsule Take 1 capsule (100 mg total) by mouth every 8 (eight) hours. 11/23/15   [name] AceAlison Charruf Ruch, MD  dicyclomine (BENTYL) 20 MG tablet Take 1 tablet (20 mg total) by mouth 2 (two) times daily. 11/20/15   [name] Creasehristopher J Pollina, MD  diphenoxylate-atropine (LOMOTIL) 2.5-0.025 MG tablet Take 1 tablet by mouth 4 (four) times daily as needed for diarrhea or loose stools. 11/20/15   [name] Creasehristopher J Pollina, MD  Empagliflozin-Linagliptin (GLYXAMBI) 10-5 MG TABS Take 1 tablet by mouth every evening.    Historical Provider, MD  Fenofibric Acid 105 MG TABS Take by mouth.    Historical Provider, MD  glimepiride (AMARYL) 4 MG tablet Take 4 mg by mouth daily with breakfast.    Historical Provider, MD  Insulin Degludec (TRESIBA FLEXTOUCH) 200 UNIT/ML SOPN Inject 18 Units into the skin at bedtime.     Historical Provider, MD  metFORMIN (GLUCOPHAGE) 500 MG tablet Take 1 tablet (500 mg total) by mouth 2 (two) times daily with a meal. 03/30/15   [name] DecampJay Ganji, MD  metoCLOPramide (REGLAN) 10 MG tablet Take 1 tablet (10 mg total) by mouth every 6 (six) hours. 11/20/15   [name] Creasehristopher J Pollina, MD  metoprolol succinate (TOPROL-XL) 50 MG 24 hr tablet Take 50 mg by mouth daily. 10/28/15   Historical Provider, MD  nitroGLYCERIN (NITROSTAT) 0.4 MG SL tablet Place 0.4 mg under the tongue every 5 (five) minutes as needed for chest pain.    Historical Provider, MD  ondansetron (ZOFRAN ODT) 4 MG disintegrating tablet Take 1 tablet (4 mg total) by mouth every 8 (eight) hours as needed for nausea or vomiting. 11/23/15   [name] AceAlison Charruf Ruch, MD  predniSONE (DELTASONE) 20 MG tablet Take 60 mg by mouth daily. 6 day course starting on 11/13/2015 11/13/15   Historical Provider, MD  promethazine (PHENERGAN) 25 MG tablet Take 1 tablet (25 mg total) by mouth every 6 (six) hours as needed for nausea or vomiting. 11/18/15   [name] OctaveStephen Rancour, MD  sertraline (ZOLOFT) 50 MG tablet Take 100 mg by mouth at bedtime.     Historical Provider, MD   valsartan (DIOVAN) 80 MG tablet Take 80 mg by mouth daily.    Historical Provider, MD  varenicline (CHANTIX) 1 MG tablet Take 1 mg by mouth 2 (two) times daily.    Historical Provider, MD   BP 142/87 mmHg  Pulse 121  Temp(Src) 98.7 F (37.1 C) (Oral)  Resp 21  Ht 5\' 8"  (1.727 m)  Wt 176 lb (79.833 kg)  BMI 26.77 kg/m2  SpO2 96%   Physical Exam  Constitutional: He is oriented to person, place, and time. He appears well-developed and well-nourished. He appears distressed (with frequent vomiting).  HENT:  Head: Normocephalic and atraumatic.  Eyes: Conjunctivae are normal. Right eye exhibits no discharge. Left eye exhibits no discharge.  Cardiovascular: Regular rhythm and normal heart sounds.  Tachycardia present.   No murmur heard. Pt is tachycardic in  the 120s-130s.  Pulmonary/Chest: Effort normal and breath sounds normal. No respiratory distress. He has no wheezes. He has no rales.  Abdominal: Soft. Bowel sounds are normal. He exhibits no distension and no mass. There is no tenderness. There is no rebound and no guarding.  Pt has a completely soft abdomen, benign, no guarding, no rebound, no masses.   Neurological: He is alert and oriented to person, place, and time. Coordination normal.  Skin: Skin is warm and dry. No rash noted. He is not diaphoretic. No erythema.  Psychiatric: He has a normal mood and affect.  Nursing note and vitals reviewed.  ED Course  Procedures (including critical care time) DIAGNOSTIC STUDIES: Oxygen Saturation is 98% on RA, normal by my interpretation.   COORDINATION OF CARE: 9:36 PM-Discussed next steps with pt including labs and fluids and meds. Pt verbalized understanding and is agreeable with the plan.   Labs Review Labs Reviewed  COMPREHENSIVE METABOLIC PANEL - Abnormal; Notable for the following:    Chloride 96 (*)    Glucose, Bld 134 (*)    ALT 14 (*)    Anion gap 17 (*)    All other components within normal limits  CBC - Abnormal;  Notable for the following:    WBC 27.2 (*)    RBC 6.05 (*)    Hemoglobin 19.1 (*)    HCT 54.2 (*)    All other components within normal limits  URINALYSIS, ROUTINE W REFLEX MICROSCOPIC (NOT AT [facility]) - Abnormal; Notable for the following:    Glucose, UA >1000 (*)    Ketones, ur 15 (*)    Protein, ur TRACE (*)    All other components within normal limits  URINE MICROSCOPIC-ADD ON - Abnormal; Notable for the following:    Squamous Epithelial / LPF 0-5 (*)    Bacteria, UA RARE (*)    All other components within normal limits  CBG MONITORING, ED - Abnormal; Notable for the following:    Glucose-Capillary 123 (*)    All other components within normal limits  LIPASE, BLOOD  CBG MONITORING, ED    Imaging Review No results found. I have personally reviewed and evaluated these images and lab results as part of my medical decision-making.      MDM   Final diagnoses:  Dehydration  Non-intractable vomiting with nausea, vomiting of unspecified type   Family member at bedside was asked to retrieve medications from home for further evaluation.  Pt has had progressive leukocytosis] Has hx of CA eval and leukocytosis Gradually worsening Source of n/v is unclear Fluids and meds given D/w Dr. Conley Rolls who will admit  I personally performed the services described in this documentation, which was scribed in my presence. The recorded information has been reviewed and is accurate.     Medications  ondansetron (ZOFRAN-ODT) disintegrating tablet 4 mg (4 mg Oral Given 11/25/15 1929)  promethazine (PHENERGAN) injection 25 mg (25 mg Intravenous Given 11/25/15 2158)  ondansetron (ZOFRAN) injection 4 mg (4 mg Intravenous Given 11/25/15 2156)  gi cocktail (Maalox,Lidocaine,Donnatal) (30 mLs Oral Given 11/25/15 2200)  famotidine (PEPCID) IVPB 20 mg premix (0 mg Intravenous Stopped 11/25/15 2218)  sodium chloride 0.9 % bolus 1,000 mL (1,000 mLs Intravenous New Bag/Given 11/25/15 2218)     [name] Hong,  MD 11/25/15 2317

## 2015-11-25 NOTE — H&P (Signed)
History and Physical    [name] [name] DOB: 1971-05-22 DOA: 11/25/2015  Referring MD/NP/PA: Eber HongBrian Miller, MD  PCP: Mike CrazeLONG,SCOTT, PA-C  Outpatient Specialists: Cardiology  Patient coming from: Home  Chief Complaint: Emesis   HPI: [name] [name] is a 45 y.o. male with medical history significant of HTN, HLD, and DM Type 2 presented with complaints of gradually worsening and intermittent emesis that onset one week ago. Patient reports that when symptoms inially began he was seen by his PCP who started him on an Augmentin with no relief. He was then prescribed another course and also given Zofran which also provided no relief for nausea and vomiting. Denies any diarrhea, abdominal pain, rashes, fever, recent Prednisone use, illicit drug use or alcohol use. Per charts patient has had multiple hospital and outpatient visits for his symptoms. Patient has been seen by Dr. Nunzio CoryPendland, Oncology-hematology, for further investigation of elevating WBC. He will be admitted for emesis, dehydration and leucocytosis.  He said he was diagnosed with dental abscess and was previously treated.    ED Course: WBC 27.2, tachycardiac in 130's. Hgb 19.1  Review of Systems: As per HPI otherwise 10 point review of systems negative.    Past Medical History  Diagnosis Date  . Malaise and fatigue   . Hypertension   . Atypical chest pain   . Hyperlipemia   . Diabetes mellitus without complication [facility](HCC)     Past Surgical History  Procedure Laterality Date  . Appendectomy  1975  . Ankle surgery  1982  . Cardiac catheterization N/A 03/28/2015    Procedure: Left Heart Cath and Coronary Angiography;  Surgeon: Yates DecampJay Ganji, MD;  [facility];  Service: Cardiovascular;  Laterality: N/A;  . Cardiac catheterization N/A 03/28/2015    Procedure: Intravascular Pressure Wire/FFR Study;  Surgeon: Yates DecampJay Ganji, MD;  [facility];  Service: Cardiovascular;  Laterality: N/A;     reports  that he has been smoking Cigarettes.  He has a 29 pack-year smoking history. He does not have any smokeless tobacco history on file. He reports that he does not drink alcohol or use illicit drugs.  Allergies  Allergen Reactions  . Omeprazole Magnesium Swelling    Family History  Problem Relation Age of Onset  . Congestive Heart Failure Sister   . Diabetes Mother   . Hypertension Mother   . Diabetes Father   . Hypertension Father      Prior to Admission medications   Medication Sig Start Date End Date Taking? Authorizing Provider  acetaminophen (TYLENOL) 500 MG tablet Take 500 mg by mouth every 6 (six) hours as needed.    Historical Provider, MD  ALPRAZolam (XANAX) 0.5 MG tablet TAKE 1/2 TO 1 TABLET BY MOUTH DAILY AT BEDTIME *MAY TAKE 3 TIMES DAILY AS NEEDED FOR ANXIETY 11/06/15   Historical Provider, MD  amoxicillin-clavulanate (AUGMENTIN) 875-125 MG tablet Take 1 tablet by mouth every 12 (twelve) hours. 10 day course starting on 11/13/2015 11/13/15   Historical Provider, MD  aspirin 81 MG chewable tablet Chew by mouth daily.    Historical Provider, MD  atorvastatin (LIPITOR) 40 MG tablet Take 40 mg by mouth daily.  07/29/14   Historical Provider, MD  benzonatate (TESSALON) 100 MG capsule Take 1 capsule (100 mg total) by mouth every 8 (eight) hours. 11/23/15   Sidney AceAlison Charruf Ruch, MD  dicyclomine (BENTYL) 20 MG tablet Take 1 tablet (20 mg total) by mouth 2 (two) times daily. 11/20/15   Canary Brimhristopher J  Pollina, MD  diphenoxylate-atropine (LOMOTIL) 2.5-0.025 MG tablet Take 1 tablet by mouth 4 (four) times daily as needed for diarrhea or loose stools. 11/20/15   Gilda Creasehristopher J Pollina, MD  Empagliflozin-Linagliptin (GLYXAMBI) 10-5 MG TABS Take 1 tablet by mouth every evening.    Historical Provider, MD  Fenofibric Acid 105 MG TABS Take by mouth.    Historical Provider, MD  glimepiride (AMARYL) 4 MG tablet Take 4 mg by mouth daily with breakfast.    Historical Provider, MD  Insulin Degludec (TRESIBA  FLEXTOUCH) 200 UNIT/ML SOPN Inject 18 Units into the skin at bedtime.     Historical Provider, MD  metFORMIN (GLUCOPHAGE) 500 MG tablet Take 1 tablet (500 mg total) by mouth 2 (two) times daily with a meal. 03/30/15   Yates DecampJay Ganji, MD  metoCLOPramide (REGLAN) 10 MG tablet Take 1 tablet (10 mg total) by mouth every 6 (six) hours. 11/20/15   Gilda Creasehristopher J Pollina, MD  metoprolol succinate (TOPROL-XL) 50 MG 24 hr tablet Take 50 mg by mouth daily. 10/28/15   Historical Provider, MD  nitroGLYCERIN (NITROSTAT) 0.4 MG SL tablet Place 0.4 mg under the tongue every 5 (five) minutes as needed for chest pain.    Historical Provider, MD  ondansetron (ZOFRAN ODT) 4 MG disintegrating tablet Take 1 tablet (4 mg total) by mouth every 8 (eight) hours as needed for nausea or vomiting. 11/23/15   Sidney AceAlison Charruf Ruch, MD  predniSONE (DELTASONE) 20 MG tablet Take 60 mg by mouth daily. 6 day course starting on 11/13/2015 11/13/15   Historical Provider, MD  promethazine (PHENERGAN) 25 MG tablet Take 1 tablet (25 mg total) by mouth every 6 (six) hours as needed for nausea or vomiting. 11/18/15   Glynn OctaveStephen Rancour, MD  sertraline (ZOLOFT) 50 MG tablet Take 100 mg by mouth at bedtime.     Historical Provider, MD  valsartan (DIOVAN) 80 MG tablet Take 80 mg by mouth daily.    Historical Provider, MD  varenicline (CHANTIX) 1 MG tablet Take 1 mg by mouth 2 (two) times daily.    Historical Provider, MD    Physical Exam: Filed Vitals:   11/25/15 1924 11/25/15 2107 11/25/15 2130 11/25/15 2200  BP: 130/93 141/107 157/110 148/108  Pulse: 129 123 127 126  Temp: 98.7 F (37.1 C)     TempSrc: Oral     Resp: 20 22 22 24   Height: 5\' 8"  (1.727 m)     Weight: 79.833 kg (176 lb)     SpO2: 100% 98% 100% 99%       Filed Vitals:   11/25/15 1924 11/25/15 2107 11/25/15 2130 11/25/15 2200  BP: 130/93 141/107 157/110 148/108  Pulse: 129 123 127 126  Temp: 98.7 F (37.1 C)     TempSrc: Oral     Resp: 20 22 22 24   Height: 5\' 8"  (1.727 m)      Weight: 79.833 kg (176 lb)     SpO2: 100% 98% 100% 99%   Constitutional: NAD, calm, comfortable Eyes: PERRL, lids and conjunctivae normal ENMT: Mucous membranes dry. Posterior pharynx clear of any exudate or lesions. Neck: normal, supple, no masses, no thyromegaly Respiratory: clear to auscultation bilaterally, no wheezing, no crackles. Normal respiratory effort. No accessory muscle use.  Cardiovascular: Regular rate and rhythm, no murmurs / rubs / gallops. No extremity edema.  Abdomen: no tenderness, no masses palpated.  Musculoskeletal: no clubbing / cyanosis. No joint deformity upper and lower extremities. Good ROM, no contractures. Normal muscle tone.  Skin: no rashes, lesions,  ulcers. No induration Neurologic: CN 2-12 grossly intact. Sensation intact, DTR normal. Strength 5/5 in all 4.  Psychiatric: Normal judgment and insight. Alert and oriented x 3. Normal mood.    Labs on Admission: I have personally reviewed following labs and imaging studies CBC:  Recent Labs Lab 11/20/15 0110 11/23/15 1535 11/25/15 1953  WBC 21.5* 22.9* 27.2*  NEUTROABS 17.1*  --   --   HGB 18.2* 16.7 19.1*  HCT 52.5* 49.1 54.2*  MCV 89.3 88.9 89.6  PLT 243 243 257   Basic Metabolic Panel:  Recent Labs Lab 11/20/15 0110 11/23/15 1535 11/25/15 1953  NA 138 135 138  K 4.1 4.1 4.1  CL 105 108 96*  CO2 23 20* 25  GLUCOSE 178* 162* 134*  BUN CREATININE 1.05 1.01 1.24  CALCIUM 8.7* 8.5* 9.3   GFR: Estimated Creatinine Clearance: 72 mL/min (by C-G formula based on Cr of 1.24). Liver Function Tests:  Recent Labs Lab 11/20/15 0110 11/23/15 1535 11/25/15 1953  AST 13* 14* 16  ALT 14* 12* 14*  ALKPHOS 74 66 82  BILITOT 1.4* 0.8 1.2  PROT 7.5 6.2* 7.7  ALBUMIN 4.1 3.3* 4.3    Recent Labs Lab 11/20/15 0110 11/23/15 1535 11/25/15 1953  LIPASE CBG:  Recent Labs Lab 11/25/15 1929  GLUCAP 123*   Urine analysis:    Component Value Date/Time   COLORURINE  YELLOW 11/25/2015 1940   APPEARANCEUR CLEAR 11/25/2015 1940   LABSPEC 1.015 11/25/2015 1940   PHURINE 5.5 11/25/2015 1940   GLUCOSEU >1000* 11/25/2015 1940   HGBUR NEGATIVE 11/25/2015 1940   BILIRUBINUR NEGATIVE 11/25/2015 1940   KETONESUR 15* 11/25/2015 1940   PROTEINUR TRACE* 11/25/2015 1940   NITRITE NEGATIVE 11/25/2015 1940   LEUKOCYTESUR NEGATIVE 11/25/2015 1940   Recent Results (from the past 240 hour(s))  Urine culture     Status: None   Collection Time: 11/23/15  7:00 PM  Result Value Ref Range Status   Specimen Description URINE, RANDOM  Final   Special Requests augmentin Normal  Final   Culture NO GROWTH  Final   Report Status 11/24/2015 FINAL  Final     Assessment/Plan  1. Leukocytosis, worsening. Patient has been noted to have persistent leukocytosis for which outpatient hematology is following. Clearly, I am suspicious for a myeloproliferative process, and will consult heme/onc.  It is true that patient was noted to have started Prednisone during his last visit at [facility]. This maybe attributing to elevated WBC. However, he also has polycythemia as well with Hb of 19g per dL, and no elevation of his Cr to indicate the degree of volume depletion.  Chronic leukocytosis could be from poor dental hygiene and gingivitis, along with tobacco use, but it is quite high.  Will obtain  CT of the maxillofacial area, and begin on IV Zosyn.  Continue to follow WBC. Follow-up blood cultures.  2. Intractable nausea and vomiting, progressively worsening. He has failed two courses of outpatient abx. Will Tx symptomatically and give IVF.  3. Dental abscess. Will start Zosyn for presumed oral infection and follow up CT maxillofacial. 4. Volume depletion, continue IVF.  5. DM Type II, stable. Start on SSI   DVT prophylaxis: Heparin  Code Status: Full  Family Communication: No family bedside Disposition Plan: Anticipate discharge home  Consults called: None  Admission status:  Observation   Houston Siren MD FACP.  [facility] If 7PM-7AM, please contact night-coverage [email or web address] Password [email or web address]  11/25/2015, 10:31 PM    By signing my name below, I, Zadie Cleverly, attest that this documentation has been prepared under the direction and in the presence of Houston Siren, MD. Electronically signed: Zadie Cleverly, Scribe. 11/25/2015 11:02pm    I, Dr. Houston Siren, personally performed the services described in this documentation. All medical record entries made by the scribe were at my discretion and in my presence. Houston Siren, MD 11/25/2015

## 2015-11-26 ENCOUNTER — Encounter (HOSPITAL_COMMUNITY): Payer: Self-pay | Admitting: *Deleted

## 2015-11-26 DIAGNOSIS — E860 Dehydration: Secondary | ICD-10-CM | POA: Insufficient documentation

## 2015-11-26 DIAGNOSIS — R739 Hyperglycemia, unspecified: Secondary | ICD-10-CM | POA: Diagnosis not present

## 2015-11-26 DIAGNOSIS — D72829 Elevated white blood cell count, unspecified: Secondary | ICD-10-CM | POA: Diagnosis not present

## 2015-11-26 DIAGNOSIS — K047 Periapical abscess without sinus: Secondary | ICD-10-CM

## 2015-11-26 DIAGNOSIS — G43A1 Cyclical vomiting, intractable: Secondary | ICD-10-CM

## 2015-11-26 LAB — GLUCOSE, CAPILLARY
GLUCOSE-CAPILLARY: 172 mg/dL — AB (ref 65–99)
Glucose-Capillary: 139 mg/dL — ABNORMAL HIGH (ref 65–99)
Glucose-Capillary: 143 mg/dL — ABNORMAL HIGH (ref 65–99)
Glucose-Capillary: 151 mg/dL — ABNORMAL HIGH (ref 65–99)
Glucose-Capillary: 155 mg/dL — ABNORMAL HIGH (ref 65–99)

## 2015-11-26 LAB — BASIC METABOLIC PANEL
Anion gap: 10 (ref 5–15)
BUN: 20 mg/dL (ref 6–20)
CALCIUM: 8.2 mg/dL — AB (ref 8.9–10.3)
CHLORIDE: 101 mmol/L (ref 101–111)
CO2: 26 mmol/L (ref 22–32)
CREATININE: 1.18 mg/dL (ref 0.61–1.24)
GFR calc Af Amer: >60 mL/min (ref >60)
GLUCOSE: 159 mg/dL — AB (ref 65–99)
POTASSIUM: 4 mmol/L (ref 3.5–5.1)
SODIUM: 137 mmol/L (ref 135–145)

## 2015-11-26 LAB — CBC WITH DIFFERENTIAL/PLATELET
BASOS PCT: 0 %
Basophils Absolute: 0 10*3/uL (ref 0.0–0.1)
Eosinophils Absolute: 0 10*3/uL (ref 0.0–0.7)
Eosinophils Relative: 0 %
HEMATOCRIT: 48.6 % (ref 39.0–52.0)
HEMOGLOBIN: 16.7 g/dL (ref 13.0–17.0)
LYMPHS ABS: 3.8 10*3/uL (ref 0.7–4.0)
Lymphocytes Relative: 19 %
MCH: 30.8 pg (ref 26.0–34.0)
MCHC: 34.4 g/dL (ref 30.0–36.0)
MCV: 89.5 fL (ref 78.0–100.0)
MONO ABS: 1.2 10*3/uL — AB (ref 0.1–1.0)
MONOS PCT: 6 %
NEUTROS ABS: 14.7 10*3/uL — AB (ref 1.7–7.7)
NEUTROS PCT: 75 %
Platelets: 236 10*3/uL (ref 150–400)
RBC: 5.43 MIL/uL (ref 4.22–5.81)
RDW: 13.2 % (ref 11.5–15.5)
WBC: 19.8 10*3/uL — ABNORMAL HIGH (ref 4.0–10.5)

## 2015-11-26 LAB — RAPID URINE DRUG SCREEN, HOSP PERFORMED
AMPHETAMINES: NOT DETECTED
Barbiturates: NOT DETECTED
Benzodiazepines: POSITIVE — AB
Cocaine: NOT DETECTED
Opiates: NOT DETECTED
TETRAHYDROCANNABINOL: NOT DETECTED

## 2015-11-26 LAB — TSH: TSH: 4.778 u[IU]/mL — ABNORMAL HIGH (ref 0.350–4.500)

## 2015-11-26 MED ORDER — ACETAMINOPHEN 325 MG PO TABS: 650.0000 mg | ORAL_TABLET | Freq: Four times a day (QID) | ORAL | Status: DC | PRN

## 2015-11-26 MED ORDER — ALPRAZOLAM 1 MG PO TABS
1.0000 mg | ORAL_TABLET | Freq: Three times a day (TID) | ORAL | Status: DC | PRN
  Administered 2015-11-26: 1 mg via ORAL
  Filled 2015-11-26: qty 1

## 2015-11-26 MED ORDER — PIPERACILLIN-TAZOBACTAM 3.375 G IVPB
3.3750 g | Freq: Three times a day (TID) | INTRAVENOUS | Status: DC
  Administered 2015-11-26 – 2015-11-28 (×8): 3.375 g via INTRAVENOUS
  Filled 2015-11-26 (×8): qty 50

## 2015-11-26 MED ORDER — SERTRALINE HCL 50 MG PO TABS
100.0000 mg | ORAL_TABLET | Freq: Every day | ORAL | Status: DC
  Administered 2015-11-26 – 2015-11-27 (×2): 100 mg via ORAL
  Filled 2015-11-26 (×3): qty 2

## 2015-11-26 MED ORDER — INSULIN ASPART 100 UNIT/ML ~~LOC~~ SOLN
0.0000 [IU] | Freq: Three times a day (TID) | SUBCUTANEOUS | Status: DC
  Administered 2015-11-26 (×2): 3 [IU] via SUBCUTANEOUS
  Administered 2015-11-26: 2 [IU] via SUBCUTANEOUS
  Administered 2015-11-27: 3 [IU] via SUBCUTANEOUS
  Administered 2015-11-28: 2 [IU] via SUBCUTANEOUS

## 2015-11-26 MED ORDER — ASPIRIN 81 MG PO CHEW
81.0000 mg | CHEWABLE_TABLET | Freq: Every day | ORAL | Status: DC
  Administered 2015-11-26 – 2015-11-28 (×3): 81 mg via ORAL
  Filled 2015-11-26 (×4): qty 1

## 2015-11-26 MED ORDER — ONDANSETRON HCL 4 MG/2ML IJ SOLN
4.0000 mg | INTRAMUSCULAR | Status: DC | PRN
  Administered 2015-11-26 (×2): 4 mg via INTRAVENOUS
  Filled 2015-11-26 (×2): qty 2

## 2015-11-26 MED ORDER — HEPARIN SODIUM (PORCINE) 5000 UNIT/ML IJ SOLN
5000.0000 [IU] | Freq: Three times a day (TID) | INTRAMUSCULAR | Status: DC
  Administered 2015-11-26 – 2015-11-28 (×7): 5000 [IU] via SUBCUTANEOUS
  Filled 2015-11-26 (×7): qty 1

## 2015-11-26 MED ORDER — METOPROLOL SUCCINATE ER 50 MG PO TB24
50.0000 mg | ORAL_TABLET | Freq: Every day | ORAL | Status: DC
  Administered 2015-11-26 – 2015-11-28 (×3): 50 mg via ORAL
  Filled 2015-11-26 (×3): qty 1

## 2015-11-26 MED ORDER — ACETAMINOPHEN 650 MG RE SUPP: 650.0000 mg | Freq: Four times a day (QID) | RECTAL | Status: DC | PRN

## 2015-11-26 MED ORDER — PIPERACILLIN-TAZOBACTAM 3.375 G IVPB 30 MIN: 3.3750 g | Freq: Three times a day (TID) | INTRAVENOUS | Status: DC

## 2015-11-26 MED ORDER — IRBESARTAN 75 MG PO TABS
75.0000 mg | ORAL_TABLET | Freq: Every day | ORAL | Status: DC
  Administered 2015-11-26 – 2015-11-28 (×3): 75 mg via ORAL
  Filled 2015-11-26 (×3): qty 1

## 2015-11-26 MED ORDER — METOCLOPRAMIDE HCL 10 MG PO TABS
10.0000 mg | ORAL_TABLET | Freq: Four times a day (QID) | ORAL | Status: DC
  Administered 2015-11-26 – 2015-11-28 (×8): 10 mg via ORAL
  Filled 2015-11-26 (×8): qty 1

## 2015-11-26 MED ORDER — DEXTROSE-NACL 5-0.9 % IV SOLN
INTRAVENOUS | Status: DC
  Administered 2015-11-26 – 2015-11-28 (×6): via INTRAVENOUS

## 2015-11-26 MED ORDER — ATORVASTATIN CALCIUM 40 MG PO TABS
40.0000 mg | ORAL_TABLET | Freq: Every day | ORAL | Status: DC
  Administered 2015-11-26 – 2015-11-28 (×3): 40 mg via ORAL
  Filled 2015-11-26 (×3): qty 1

## 2015-11-26 MED ORDER — DICYCLOMINE HCL 10 MG PO CAPS
20.0000 mg | ORAL_CAPSULE | Freq: Two times a day (BID) | ORAL | Status: DC
  Administered 2015-11-26 – 2015-11-28 (×5): 20 mg via ORAL
  Filled 2015-11-26 (×11): qty 2

## 2015-11-26 MED ORDER — INSULIN ASPART 100 UNIT/ML ~~LOC~~ SOLN: 0.0000 [IU] | Freq: Every day | SUBCUTANEOUS | Status: DC

## 2015-11-26 MED ORDER — GLIMEPIRIDE 2 MG PO TABS
4.0000 mg | ORAL_TABLET | Freq: Every day | ORAL | Status: DC
  Administered 2015-11-27 – 2015-11-28 (×2): 4 mg via ORAL
  Filled 2015-11-26 (×2): qty 2

## 2015-11-26 NOTE — Progress Notes (Addendum)
[facility] PROGRESS NOTE  [name] [name] ZOX:[id number] DOB: [birth year] DOA: 11/25/2015   PCP: Mike Craze     Assessment/Plan: Principal Problem:   Intractable nausea and vomiting Active Problems:   Leukocytosis   Volume depletion   Hyperglycemia   Dental abscess   Nausea and vomiting   Dehydration   45 y.o. male with medical history significant of HTN, HLD, and DM Type 2 presented with complaints of gradually worsening and intermittent emesis that onset one week ago. Patient reports that when symptoms inially began he was seen by his PCP who started him on an Augmentin with no relief. He was then prescribed another course and also given Zofran which also provided no relief for nausea and vomiting. Per charts patient has had multiple hospital and outpatient visits for his symptoms. Patient has been seen by Dr. Nunzio Cory, Oncology-hematology, for further investigation of elevating WBC. He will be admitted for emesis, dehydration and leucocytosis.  Assessment and plan Leukocytosis-likely reactive vs secondary to acute sinusitis, no pneumonia, no UTI Has previously been seen by hematology oncology, per Dr. Galen Manila leukocytosis was thought to be reactive likely secondary to smoking Patient also was recently prescribed prednisone for COPD exacerbation   Acute right maxillary sinusitis-on Zosyn, if nausea vomiting resolves, may transition to Augmentin for 2 weeks  Diabetes mellitus type 2, on SSI, currently on glyxambi, Amaryl, Glucophage. Check hemoglobin A1c  Intractable nausea vomiting for one week could be secondary to postnasal drip secondary to acute sinusitis, cannot rule out gastroparesis, resume Reglan. No evidence of DKA. Check UDS  Essential hypertension-controlled continue metoprolol,   DVT prophylaxsis Heparin   Code Status:  Full code  Family Communication: Discussed in detail with the patient, all imaging results, lab results explained to the  patient   Disposition Plan:Anticipate discharge in one to 2 days    Consultants:  *None   Procedures:  None   Antibiotics: Anti-infectives    Start     Dose/Rate Route Frequency Ordered Stop   11/26/15 0045  piperacillin-tazobactam (ZOSYN) IVPB 3.375 g     3.375 g 12.5 mL/hr over 240 Minutes Intravenous Every 8 hours 11/26/15 0036        HPI/Subjective: Denies any nausea vomiting this morning and ready to advanced diet  Objective: Filed Vitals:   11/25/15 2315 11/25/15 2330 11/26/15 0035 11/26/15 0517  BP:  178/112 137/101 145/106  Pulse: 114 111 97 116  Temp:   99.8 F (37.7 C) 99 F (37.2 C)  TempSrc:   Oral Oral  Resp: Height:    (1.727 m)   Weight:   78.79 kg (173 lb 11.2 oz)   SpO2: 97% 100% 100% 100%   No intake or output data in the 24 hours ending 11/26/15 1048  Exam:  Examination:  General exam: Appears calm and comfortable  Respiratory system: Clear to auscultation. Respiratory effort normal. Cardiovascular system: S1 & S2 heard, RRR. No JVD, murmurs, rubs, gallops or clicks. No pedal edema. Gastrointestinal system: Abdomen is nondistended, soft and nontender. No organomegaly or masses felt. Normal bowel sounds heard. Central nervous system: Alert and oriented. No focal neurological deficits. Extremities: Symmetric 5 x 5 power. Skin: No rashes, lesions or ulcers Psychiatry: Judgement and insight appear normal. Mood & affect appropriate.     Data Reviewed: I have personally reviewed following labs and imaging studies  Micro Results Recent Results (from the past 240 hour(s))  Urine culture  Status: None   Collection Time: 11/23/15  7:00 PM  Result Value Ref Range Status   Specimen Description URINE, RANDOM  Final   Special Requests augmentin Normal  Final   Culture NO GROWTH  Final   Report Status 11/24/2015 FINAL  Final  Culture, blood (Routine X 2) w Reflex to ID Panel     Status: None (Preliminary result)    Collection Time: 11/25/15 12:00 AM  Result Value Ref Range Status   Specimen Description BLOOD RIGHT ANTECUBITAL  Final   Special Requests BOTTLES DRAWN AEROBIC AND ANAEROBIC 4CC EACH  Final   Culture NO GROWTH < 12 HOURS  Final   Report Status PENDING  Incomplete  Culture, blood (Routine X 2) w Reflex to ID Panel     Status: None (Preliminary result)   Collection Time: 11/25/15 11:40 PM  Result Value Ref Range Status   Specimen Description BLOOD RIGHT ANTECUBITAL  Final   Special Requests BOTTLES DRAWN AEROBIC AND ANAEROBIC 6CC EACH  Final   Culture NO GROWTH < 12 HOURS  Final   Report Status PENDING  Incomplete    Radiology Reports Dg Chest 2 View  11/23/2015  CLINICAL DATA:  Burning sensation in chest radiating to throat. Lower abdominal and back pain. EXAM: CHEST  2 VIEW COMPARISON:  Chest x-rays dated 11/18/2015 and 07/29/2014. FINDINGS: Heart size is normal. Cardiomediastinal silhouette is stable in size and configuration. Lungs are clear. No pleural effusion or pneumothorax seen. Mild degenerative spurring again noted within the thoracic spine. No acute- appearing osseous abnormality. IMPRESSION: No acute findings.  Lungs are clear.  Heart size is normal. Electronically Signed   By: Bary RichardStan  Maynard M.D.   On: 11/23/2015 18:24   Dg Chest 2 View  11/18/2015  CLINICAL DATA:  Acute onset of congestion and shortness of breath. Vomiting. Coughing and gagging. Initial encounter. EXAM: CHEST  2 VIEW COMPARISON:  Chest radiograph performed 07/29/2014 FINDINGS: The lungs are well-aerated and clear. There is no evidence of focal opacification, pleural effusion or pneumothorax. The heart is normal in size; the mediastinal contour is within normal limits. No acute osseous abnormalities are seen. IMPRESSION: No acute cardiopulmonary process seen. Electronically Signed   By: Roanna RaiderJeffery  Chang M.D.   On: 11/18/2015 21:19   Ct Abdomen Pelvis W Contrast  11/20/2015  CLINICAL DATA:  Acute onset of moderate  lower abdominal pain and nausea. Non-bloody vomiting. Initial encounter. EXAM: CT ABDOMEN AND PELVIS WITH CONTRAST TECHNIQUE: Multidetector CT imaging of the abdomen and pelvis was performed using the standard protocol following bolus administration of intravenous contrast. CONTRAST:  100mL ISOVUE-300 IOPAMIDOL (ISOVUE-300) INJECTION 61% COMPARISON:  Abdominal radiograph performed 11/18/2015 FINDINGS: The visualized lung bases are clear. The liver and spleen are unremarkable in appearance. The gallbladder is within normal limits. The pancreas and adrenal glands are unremarkable. The kidneys are unremarkable in appearance. There is no evidence of hydronephrosis. No renal or ureteral stones are seen. Nonspecific perinephric stranding is noted bilaterally. No free fluid is identified. The small bowel is unremarkable in appearance. The stomach is within normal limits. No acute vascular abnormalities are seen. Scattered calcification is noted along the abdominal aorta and its branches. The patient is status post appendectomy. The colon is grossly unremarkable in appearance. The bladder is moderately distended and grossly unremarkable. A small urachal remnant is incidentally seen. The prostate remains normal in size. No inguinal lymphadenopathy is seen. No acute osseous abnormalities are identified. IMPRESSION: 1. No acute abnormality seen within the abdomen or pelvis.  2. Scattered calcification along the abdominal aorta and its branches. Electronically Signed   By: Roanna RaiderJeffery  Chang M.D.   On: 11/20/2015 03:19   Ct Maxillofacial W Contrast  11/26/2015  CLINICAL DATA:  Recent diagnosis of dental abscess with subsequent treatment. Leukocytosis and continued dental pain. Intermittent vomiting for 1 week. History of diabetes. EXAM: CT MAXILLOFACIAL WITH CONTRAST TECHNIQUE: Multidetector CT imaging of the maxillofacial structures was performed with intravenous contrast. Multiplanar CT image reconstructions were also  generated. A small metallic BB was placed on the right temple in order to reliably differentiate right from left. CONTRAST:  75mL ISOVUE-300 IOPAMIDOL (ISOVUE-300) INJECTION 61% COMPARISON:  None. FINDINGS: FACIAL BONES: The mandible is intact, the condyles are located. No acute facial fracture. No destructive bony lesions. Multiple absent teeth. Scattered dental caries. Probable bone island RIGHT mandible body. SINUSES: RIGHT maxillary sinus air-fluid level. Scattered sub cm mucosal retention cyst. Mastoid air cells are well aerated. Nasal septum slightly deviated to the RIGHT. Soft tissue effaces the RIGHT ostiomeatal unit. ORBITS: Ocular globes and orbital contents are unremarkable. SOFT TISSUES: No significant soft tissue swelling. No subcutaneous gas or radiopaque foreign bodies. Eccentric calcific atherosclerosis results in at least mild to moderate stenosis RIGHT internal carotid artery origin though not tailored for evaluation. IMPRESSION: Multiple absent teeth and dental caries, no dental abscess. Acute RIGHT maxillary sinusitis. At least mild to moderate RIGHT internal carotid artery stenosis, not tailored for evaluation. Electronically Signed   By: Awilda Metroourtnay  Bloomer M.D.   On: 11/26/2015 00:59   Dg Abd 2 Views  11/18/2015  CLINICAL DATA:  Pt c/o productive cough, congestion, N/V, and ABD pain x 1 wk. CXR earlier tonight. Hx HTN, diabetes, appendectomy, smoker EXAM: ABDOMEN - 2 VIEW COMPARISON:  Chest from earlier the same day FINDINGS: Visualized lung bases clear. No free air. Normal bowel gas pattern. No abnormal abdominal calcifications. Regional bones unremarkable. IMPRESSION: Negative. Electronically Signed   By: Corlis Leak  Hassell M.D.   On: 11/18/2015 22:54     CBC  Recent Labs Lab 11/20/15 0110 11/23/15 1535 11/25/15 1953 11/26/15 0531  WBC 21.5* 22.9* 27.2* 19.8*  HGB 18.2* 16.7 19.1* 16.7  HCT 52.5* 49.1 54.2* 48.6  PLT 243 243 257 236  MCV 89.3 88.9 89.6 89.5  MCH 31.0 30.3 31.6 30.8   MCHC 34.7 34.0 35.2 34.4  RDW 13.6 13.1 13.0 13.2  LYMPHSABS 3.1  --   --  3.8  MONOABS 1.3*  --   --  1.2*  EOSABS 0.1  --   --  0.0  BASOSABS 0.0  --   --  0.0    Chemistries   Recent Labs Lab 11/20/15 0110 11/23/15 1535 11/25/15 1953 11/26/15 0531  NA 138 135 138 137  K 4.1 4.1 4.1 4.0  CL 105 108 96* 101  CO2 23 20* 25 26  GLUCOSE 178* 162* 134* 159*  BUN 19 13 20 20   CREATININE 1.05 1.01 1.24 1.18  CALCIUM 8.7* 8.5* 9.3 8.2*  AST 13* 14* 16  --   ALT 14* 12* 14*  --   ALKPHOS 74 66 82  --   BILITOT 1.4* 0.8 1.2  --    ------------------------------------------------------------------------------------------------------------------ estimated creatinine clearance is 75.7 mL/min (by C-G formula based on Cr of 1.18). ------------------------------------------------------------------------------------------------------------------ No results for input(s): HGBA1C in the last 72 hours. ------------------------------------------------------------------------------------------------------------------ No results for input(s): CHOL, HDL, LDLCALC, TRIG, CHOLHDL, LDLDIRECT in the last 72 hours. ------------------------------------------------------------------------------------------------------------------  Recent Labs  11/25/15 1949  TSH 4.778*   ------------------------------------------------------------------------------------------------------------------  No results for input(s): VITAMINB12, FOLATE, FERRITIN, TIBC, IRON, RETICCTPCT in the last 72 hours.  Coagulation profile No results for input(s): INR, PROTIME in the last 168 hours.  No results for input(s): DDIMER in the last 72 hours.  Cardiac Enzymes No results for input(s): CKMB, TROPONINI, MYOGLOBIN in the last 168 hours.  Invalid input(s): CK ------------------------------------------------------------------------------------------------------------------ Invalid input(s): POCBNP   CBG:  Recent  Labs Lab 11/25/15 1929 11/26/15 0057 11/26/15 0733  GLUCAP 123* 143* 151*       Studies: Ct Maxillofacial W Contrast  11/26/2015  CLINICAL DATA:  Recent diagnosis of dental abscess with subsequent treatment. Leukocytosis and continued dental pain. Intermittent vomiting for 1 week. History of diabetes. EXAM: CT MAXILLOFACIAL WITH CONTRAST TECHNIQUE: Multidetector CT imaging of the maxillofacial structures was performed with intravenous contrast. Multiplanar CT image reconstructions were also generated. A small metallic BB was placed on the right temple in order to reliably differentiate right from left. CONTRAST:  75mL ISOVUE-300 IOPAMIDOL (ISOVUE-300) INJECTION 61% COMPARISON:  None. FINDINGS: FACIAL BONES: The mandible is intact, the condyles are located. No acute facial fracture. No destructive bony lesions. Multiple absent teeth. Scattered dental caries. Probable bone island RIGHT mandible body. SINUSES: RIGHT maxillary sinus air-fluid level. Scattered sub cm mucosal retention cyst. Mastoid air cells are well aerated. Nasal septum slightly deviated to the RIGHT. Soft tissue effaces the RIGHT ostiomeatal unit. ORBITS: Ocular globes and orbital contents are unremarkable. SOFT TISSUES: No significant soft tissue swelling. No subcutaneous gas or radiopaque foreign bodies. Eccentric calcific atherosclerosis results in at least mild to moderate stenosis RIGHT internal carotid artery origin though not tailored for evaluation. IMPRESSION: Multiple absent teeth and dental caries, no dental abscess. Acute RIGHT maxillary sinusitis. At least mild to moderate RIGHT internal carotid artery stenosis, not tailored for evaluation. Electronically Signed   By: Awilda Metro M.D.   On: 11/26/2015 00:59      No results found for: HGBA1C Lab Results  Component Value Date   CREATININE 1.18 11/26/2015       Scheduled Meds: . aspirin  81 mg Oral Daily  . atorvastatin  40 mg Oral Daily  . heparin  5,000  Units Subcutaneous Q8H  . insulin aspart  0-15 Units Subcutaneous TID WC  . insulin aspart  0-5 Units Subcutaneous QHS  . irbesartan  75 mg Oral Daily  . piperacillin-tazobactam (ZOSYN)  IV  3.375 g Intravenous Q8H  . sertraline  100 mg Oral QHS   Continuous Infusions: . dextrose 5 % and 0.9% NaCl 125 mL/hr at 11/26/15 4098        Time spent: >30 MINS    [facility]  [facility] Pager [phone]. If 7PM-7AM, please contact night-coverage at [email or web address], password [facility] 11/26/2015, 10:48 AM

## 2015-11-26 NOTE — Progress Notes (Signed)
Patient has been worked-up for MPD in the outpatient setting.  Work-up is negative.  Will see patient in the outpatient setting as scheduled for follow-up.  No hematology recommendations at this time.  Patient not seen.  Dellis AnesKEFALAS,Massimiliano Rohleder, PA-C 11/26/2015 8:44 PM

## 2015-11-26 NOTE — Care Management Note (Signed)
Case Management Note  Patient Details  Name: [name] [name] MRN: [id number] Date of Birth: [birth year]  Subjective/Objective: Patient admitted from home with intractable N/V. He reports working at [employer], although he has not been at work for the past two weeks due to illness. He reports [place] [facility] [facility] [place]. He has [employer] insurance and reports no issues obtaining medications.                Action/Plan: Anticipate d/c home with self care. No CM needs identified.    Expected Discharge Date:  11/28/15               Expected Discharge Plan:  Home/Self Care  In-House Referral:     Discharge planning Services  CM Consult  Post Acute Care Choice:  NA Choice offered to:  NA  DME Arranged:    DME Agency:     HH Arranged:    HH Agency:     Status of Service:  Completed, signed off  If discussed at [employer] Length of Stay Meetings, dates discussed:    Additional Comments:  [name] [name], [name] OilerSharley Diane, RN 11/26/2015, 2:10 PM

## 2015-11-27 ENCOUNTER — Observation Stay (HOSPITAL_COMMUNITY): Payer: 59

## 2015-11-27 DIAGNOSIS — E860 Dehydration: Secondary | ICD-10-CM | POA: Diagnosis not present

## 2015-11-27 DIAGNOSIS — G43A1 Cyclical vomiting, intractable: Secondary | ICD-10-CM | POA: Diagnosis not present

## 2015-11-27 DIAGNOSIS — D72829 Elevated white blood cell count, unspecified: Secondary | ICD-10-CM | POA: Diagnosis not present

## 2015-11-27 LAB — COMPREHENSIVE METABOLIC PANEL
ALBUMIN: 3.1 g/dL — AB (ref 3.5–5.0)
ALK PHOS: 58 U/L (ref 38–126)
ALT: 10 U/L — AB (ref 17–63)
ANION GAP: 5 (ref 5–15)
AST: 11 U/L — ABNORMAL LOW (ref 15–41)
BUN: 11 mg/dL (ref 6–20)
CALCIUM: 8.1 mg/dL — AB (ref 8.9–10.3)
CHLORIDE: 105 mmol/L (ref 101–111)
CO2: 26 mmol/L (ref 22–32)
CREATININE: 0.92 mg/dL (ref 0.61–1.24)
GFR calc Af Amer: >60 mL/min (ref >60)
GFR calc non Af Amer: >60 mL/min (ref >60)
GLUCOSE: 139 mg/dL — AB (ref 65–99)
Potassium: 3.9 mmol/L (ref 3.5–5.1)
SODIUM: 136 mmol/L (ref 135–145)
Total Bilirubin: 0.9 mg/dL (ref 0.3–1.2)
Total Protein: 6 g/dL — ABNORMAL LOW (ref 6.5–8.1)

## 2015-11-27 LAB — GLUCOSE, CAPILLARY
GLUCOSE-CAPILLARY: 111 mg/dL — AB (ref 65–99)
GLUCOSE-CAPILLARY: 115 mg/dL — AB (ref 65–99)
Glucose-Capillary: 173 mg/dL — ABNORMAL HIGH (ref 65–99)
Glucose-Capillary: 78 mg/dL (ref 65–99)

## 2015-11-27 LAB — CBC WITH DIFFERENTIAL/PLATELET
BASOS PCT: 0 %
Basophils Absolute: 0 10*3/uL (ref 0.0–0.1)
EOS ABS: 0.1 10*3/uL (ref 0.0–0.7)
EOS PCT: 1 %
HCT: 46 % (ref 39.0–52.0)
HEMOGLOBIN: 15.7 g/dL (ref 13.0–17.0)
LYMPHS ABS: 3.6 10*3/uL (ref 0.7–4.0)
LYMPHS PCT: 28 %
MCH: 31.1 pg (ref 26.0–34.0)
MCHC: 34.1 g/dL (ref 30.0–36.0)
MCV: 91.1 fL (ref 78.0–100.0)
Monocytes Absolute: 0.7 10*3/uL (ref 0.1–1.0)
Monocytes Relative: 6 %
NEUTROS ABS: 8.2 10*3/uL — AB (ref 1.7–7.7)
Neutrophils Relative %: 65 %
PLATELETS: 217 10*3/uL (ref 150–400)
RBC: 5.05 MIL/uL (ref 4.22–5.81)
RDW: 13.2 % (ref 11.5–15.5)
WBC: 12.6 10*3/uL — ABNORMAL HIGH (ref 4.0–10.5)

## 2015-11-27 LAB — HEMOGLOBIN A1C
Hgb A1c MFr Bld: 7.8 % — ABNORMAL HIGH (ref 4.8–5.6)
Mean Plasma Glucose: 177 mg/dL

## 2015-11-27 MED ORDER — BISACODYL 10 MG RE SUPP
10.0000 mg | Freq: Once | RECTAL | Status: AC
  Administered 2015-11-27: 10 mg via RECTAL
  Filled 2015-11-27: qty 1

## 2015-11-27 MED ORDER — DIATRIZOATE MEGLUMINE & SODIUM 66-10 % PO SOLN
ORAL | Status: AC
  Filled 2015-11-27: qty 30

## 2015-11-27 MED ORDER — SUCRALFATE 1 GM/10ML PO SUSP
1.0000 g | Freq: Three times a day (TID) | ORAL | Status: DC
  Administered 2015-11-27 – 2015-11-28 (×3): 1 g via ORAL
  Filled 2015-11-27 (×3): qty 10

## 2015-11-27 MED ORDER — PANTOPRAZOLE SODIUM 40 MG PO TBEC
40.0000 mg | DELAYED_RELEASE_TABLET | Freq: Every day | ORAL | Status: DC
  Administered 2015-11-27 – 2015-11-28 (×2): 40 mg via ORAL
  Filled 2015-11-27 (×2): qty 1

## 2015-11-27 NOTE — Progress Notes (Signed)
[facility] PROGRESS NOTE  [name] [name] WUJ:[id number] DOB: 1971-03-01 DOA: 11/25/2015   PCP: Mike Craze     Assessment/Plan: Principal Problem:   Intractable nausea and vomiting Active Problems:   Leukocytosis   Volume depletion   Hyperglycemia   Dental abscess   Nausea and vomiting   Dehydration   45 y.o. male with medical history significant of HTN, HLD, and DM Type 2 presented with complaints of gradually worsening and intermittent emesis that onset one week ago. Patient reports that when symptoms inially began he was seen by his PCP who started him on an Augmentin with no relief. He was then prescribed another course and also given Zofran which also provided no relief for nausea and vomiting. Per charts patient has had multiple hospital and outpatient visits for his symptoms. Patient has been seen by Dr. Nunzio Cory, Oncology-hematology, for further investigation of elevating WBC. He will be admitted for emesis, dehydration and leucocytosis.  Assessment and plan Leukocytosis-likely reactive vs secondary to acute sinusitis, no pneumonia, no UTI improving Has previously been seen by hematology oncology, per Dr. Galen Manila leukocytosis was thought to be reactive likely secondary to smoking Patient also was recently prescribed prednisone for COPD exacerbation   Acute right maxillary sinusitis-on Zosyn, continues to have nausea and vomiting therefore unable to transition to Augmentin orally today  Diabetes mellitus type 2, on SSI, currently on glyxambi, Amaryl, Glucophage. Hemoglobin A1c 7.8  Intractable nausea vomiting for one week could be secondary to postnasal drip secondary to acute sinusitis, cannot rule out gastroparesis, viral gastroenteritis, no significant improvement on Reglan. No evidence of DKA. UDS negative for marijuana, CT scan on 6/22 was negative. Will repeat CT scan today given no improvement in his symptoms. Also to rule out bowel obstruction in the  setting of intractable nausea. Optimize antireflux therapy will start patient on PPI and Carafate, if no improvement consider GI consultation tomorrow.  Essential hypertension-controlled continue metoprolol,   DVT prophylaxsis Heparin   Code Status:  Full code  Family Communication: Discussed in detail with the patient, all imaging results, lab results explained to the patient   Disposition Plan:Anticipate discharge in one to 2 days    Consultants:  *None   Procedures:  None   Antibiotics: Anti-infectives    Start     Dose/Rate Route Frequency Ordered Stop   11/26/15 0045  piperacillin-tazobactam (ZOSYN) IVPB 3.375 g     3.375 g 12.5 mL/hr over 240 Minutes Intravenous Every 8 hours 11/26/15 0036        HPI/Subjective: Not really tolerating regular diet, encourage patient to continue to try clear liquids and full liquids as tolerated  Objective: Filed Vitals:   11/26/15 0517 11/26/15 1329 11/26/15 2125 11/27/15 0556  BP: 145/106 95/54 156/90 173/99  Pulse: 116 89 98 87  Temp: 99 F (37.2 C) 97.1 F (36.2 C) 99.7 F (37.6 C) 98.4 F (36.9 C)  TempSrc: Oral Oral Oral Oral  Resp: 20 20 20 20   Height:      Weight:      SpO2: 100% 95% 99% 99%    Intake/Output Summary (Last 24 hours) at 11/27/15 1411 Last data filed at 11/27/15 0131  Gross per 24 hour  Intake 3277.5 ml  Output   1000 ml  Net 2277.5 ml    Exam:  Examination:  General exam: Appears calm and comfortable  Respiratory system: Clear to auscultation. Respiratory effort normal. Cardiovascular system: S1 & S2 heard, RRR. No JVD, murmurs, rubs,  gallops or clicks. No pedal edema. Gastrointestinal system: Abdomen is nondistended, soft and nontender. No organomegaly or masses felt. Normal bowel sounds heard. Central nervous system: Alert and oriented. No focal neurological deficits. Extremities: Symmetric 5 x 5 power. Skin: No rashes, lesions or ulcers Psychiatry: Judgement and insight appear  normal. Mood & affect appropriate.     Data Reviewed: I have personally reviewed following labs and imaging studies  Micro Results Recent Results (from the past 240 hour(s))  Urine culture     Status: None   Collection Time: 11/23/15  7:00 PM  Result Value Ref Range Status   Specimen Description URINE, RANDOM  Final   Special Requests augmentin Normal  Final   Culture NO GROWTH  Final   Report Status 11/24/2015 FINAL  Final  Culture, blood (Routine X 2) w Reflex to ID Panel     Status: None (Preliminary result)   Collection Time: 11/25/15 12:00 AM  Result Value Ref Range Status   Specimen Description BLOOD RIGHT ANTECUBITAL  Final   Special Requests BOTTLES DRAWN AEROBIC AND ANAEROBIC 4CC EACH  Final   Culture NO GROWTH 2 DAYS  Final   Report Status PENDING  Incomplete  Culture, blood (Routine X 2) w Reflex to ID Panel     Status: None (Preliminary result)   Collection Time: 11/25/15 11:40 PM  Result Value Ref Range Status   Specimen Description BLOOD RIGHT ANTECUBITAL  Final   Special Requests BOTTLES DRAWN AEROBIC AND ANAEROBIC 6CC EACH  Final   Culture NO GROWTH 2 DAYS  Final   Report Status PENDING  Incomplete    Radiology Reports Dg Chest 2 View  11/23/2015  CLINICAL DATA:  Burning sensation in chest radiating to throat. Lower abdominal and back pain. EXAM: CHEST  2 VIEW COMPARISON:  Chest x-rays dated 11/18/2015 and 07/29/2014. FINDINGS: Heart size is normal. Cardiomediastinal silhouette is stable in size and configuration. Lungs are clear. No pleural effusion or pneumothorax seen. Mild degenerative spurring again noted within the thoracic spine. No acute- appearing osseous abnormality. IMPRESSION: No acute findings.  Lungs are clear.  Heart size is normal. Electronically Signed   By: Bary RichardStan  Maynard M.D.   On: 11/23/2015 18:24   Dg Chest 2 View  11/18/2015  CLINICAL DATA:  Acute onset of congestion and shortness of breath. Vomiting. Coughing and gagging. Initial encounter.  EXAM: CHEST  2 VIEW COMPARISON:  Chest radiograph performed 07/29/2014 FINDINGS: The lungs are well-aerated and clear. There is no evidence of focal opacification, pleural effusion or pneumothorax. The heart is normal in size; the mediastinal contour is within normal limits. No acute osseous abnormalities are seen. IMPRESSION: No acute cardiopulmonary process seen. Electronically Signed   By: Roanna RaiderJeffery  Chang M.D.   On: 11/18/2015 21:19   Ct Abdomen Pelvis W Contrast  11/20/2015  CLINICAL DATA:  Acute onset of moderate lower abdominal pain and nausea. Non-bloody vomiting. Initial encounter. EXAM: CT ABDOMEN AND PELVIS WITH CONTRAST TECHNIQUE: Multidetector CT imaging of the abdomen and pelvis was performed using the standard protocol following bolus administration of intravenous contrast. CONTRAST:  100mL ISOVUE-300 IOPAMIDOL (ISOVUE-300) INJECTION 61% COMPARISON:  Abdominal radiograph performed 11/18/2015 FINDINGS: The visualized lung bases are clear. The liver and spleen are unremarkable in appearance. The gallbladder is within normal limits. The pancreas and adrenal glands are unremarkable. The kidneys are unremarkable in appearance. There is no evidence of hydronephrosis. No renal or ureteral stones are seen. Nonspecific perinephric stranding is noted bilaterally. No free fluid is identified. The  small bowel is unremarkable in appearance. The stomach is within normal limits. No acute vascular abnormalities are seen. Scattered calcification is noted along the abdominal aorta and its branches. The patient is status post appendectomy. The colon is grossly unremarkable in appearance. The bladder is moderately distended and grossly unremarkable. A small urachal remnant is incidentally seen. The prostate remains normal in size. No inguinal lymphadenopathy is seen. No acute osseous abnormalities are identified. IMPRESSION: 1. No acute abnormality seen within the abdomen or pelvis. 2. Scattered calcification along the  abdominal aorta and its branches. Electronically Signed   By: Roanna RaiderJeffery  Chang M.D.   On: 11/20/2015 03:19   Ct Maxillofacial W Contrast  11/26/2015  CLINICAL DATA:  Recent diagnosis of dental abscess with subsequent treatment. Leukocytosis and continued dental pain. Intermittent vomiting for 1 week. History of diabetes. EXAM: CT MAXILLOFACIAL WITH CONTRAST TECHNIQUE: Multidetector CT imaging of the maxillofacial structures was performed with intravenous contrast. Multiplanar CT image reconstructions were also generated. A small metallic BB was placed on the right temple in order to reliably differentiate right from left. CONTRAST:  75mL ISOVUE-300 IOPAMIDOL (ISOVUE-300) INJECTION 61% COMPARISON:  None. FINDINGS: FACIAL BONES: The mandible is intact, the condyles are located. No acute facial fracture. No destructive bony lesions. Multiple absent teeth. Scattered dental caries. Probable bone island RIGHT mandible body. SINUSES: RIGHT maxillary sinus air-fluid level. Scattered sub cm mucosal retention cyst. Mastoid air cells are well aerated. Nasal septum slightly deviated to the RIGHT. Soft tissue effaces the RIGHT ostiomeatal unit. ORBITS: Ocular globes and orbital contents are unremarkable. SOFT TISSUES: No significant soft tissue swelling. No subcutaneous gas or radiopaque foreign bodies. Eccentric calcific atherosclerosis results in at least mild to moderate stenosis RIGHT internal carotid artery origin though not tailored for evaluation. IMPRESSION: Multiple absent teeth and dental caries, no dental abscess. Acute RIGHT maxillary sinusitis. At least mild to moderate RIGHT internal carotid artery stenosis, not tailored for evaluation. Electronically Signed   By: Awilda Metroourtnay  Bloomer M.D.   On: 11/26/2015 00:59   Dg Abd 2 Views  11/18/2015  CLINICAL DATA:  Pt c/o productive cough, congestion, N/V, and ABD pain x 1 wk. CXR earlier tonight. Hx HTN, diabetes, appendectomy, smoker EXAM: ABDOMEN - 2 VIEW COMPARISON:   Chest from earlier the same day FINDINGS: Visualized lung bases clear. No free air. Normal bowel gas pattern. No abnormal abdominal calcifications. Regional bones unremarkable. IMPRESSION: Negative. Electronically Signed   By: Corlis Leak  Hassell M.D.   On: 11/18/2015 22:54     CBC  Recent Labs Lab 11/23/15 1535 11/25/15 1953 11/26/15 0531 11/27/15 0547  WBC 22.9* 27.2* 19.8* 12.6*  HGB 16.7 19.1* 16.7 15.7  HCT 49.1 54.2* 48.6 46.0  PLT 243 257 236 217  MCV 88.9 89.6 89.5 91.1  MCH 30.3 31.6 30.8 31.1  MCHC 34.0 35.2 34.4 34.1  RDW 13.1 13.0 13.2 13.2  LYMPHSABS  --   --  3.8 3.6  MONOABS  --   --  1.2* 0.7  EOSABS  --   --  0.0 0.1  BASOSABS  --   --  0.0 0.0    Chemistries   Recent Labs Lab 11/23/15 1535 11/25/15 1953 11/26/15 0531 11/27/15 0547  NA 135 138 137 136  K 4.1 4.1 4.0 3.9  CL 108 96* 101 105  CO2 20* 25 26 26   GLUCOSE 162* 134* 159* 139*  BUN 13 20 20 11   CREATININE 1.01 1.24 1.18 0.92  CALCIUM 8.5* 9.3 8.2* 8.1*  AST 14* 16  --  11*  ALT 12* 14*  --  10*  ALKPHOS 66 82  --  58  BILITOT 0.8 1.2  --  0.9   ------------------------------------------------------------------------------------------------------------------ estimated creatinine clearance is 97.1 mL/min (by C-G formula based on Cr of 0.92). ------------------------------------------------------------------------------------------------------------------  Recent Labs  11/26/15 0531  HGBA1C 7.8*   ------------------------------------------------------------------------------------------------------------------ No results for input(s): CHOL, HDL, LDLCALC, TRIG, CHOLHDL, LDLDIRECT in the last 72 hours. ------------------------------------------------------------------------------------------------------------------  Recent Labs  11/25/15 1949  TSH 4.778*   ------------------------------------------------------------------------------------------------------------------ No results for  input(s): VITAMINB12, FOLATE, FERRITIN, TIBC, IRON, RETICCTPCT in the last 72 hours.  Coagulation profile No results for input(s): INR, PROTIME in the last 168 hours.  No results for input(s): DDIMER in the last 72 hours.  Cardiac Enzymes No results for input(s): CKMB, TROPONINI, MYOGLOBIN in the last 168 hours.  Invalid input(s): CK ------------------------------------------------------------------------------------------------------------------ Invalid input(s): POCBNP   CBG:  Recent Labs Lab 11/26/15 1108 11/26/15 1619 11/26/15 2035 11/27/15 0743 11/27/15 1145  GLUCAP 172* 139* 155* 173* 111*       Studies: Ct Maxillofacial W Contrast  11/26/2015  CLINICAL DATA:  Recent diagnosis of dental abscess with subsequent treatment. Leukocytosis and continued dental pain. Intermittent vomiting for 1 week. History of diabetes. EXAM: CT MAXILLOFACIAL WITH CONTRAST TECHNIQUE: Multidetector CT imaging of the maxillofacial structures was performed with intravenous contrast. Multiplanar CT image reconstructions were also generated. A small metallic BB was placed on the right temple in order to reliably differentiate right from left. CONTRAST:  75mL ISOVUE-300 IOPAMIDOL (ISOVUE-300) INJECTION 61% COMPARISON:  None. FINDINGS: FACIAL BONES: The mandible is intact, the condyles are located. No acute facial fracture. No destructive bony lesions. Multiple absent teeth. Scattered dental caries. Probable bone island RIGHT mandible body. SINUSES: RIGHT maxillary sinus air-fluid level. Scattered sub cm mucosal retention cyst. Mastoid air cells are well aerated. Nasal septum slightly deviated to the RIGHT. Soft tissue effaces the RIGHT ostiomeatal unit. ORBITS: Ocular globes and orbital contents are unremarkable. SOFT TISSUES: No significant soft tissue swelling. No subcutaneous gas or radiopaque foreign bodies. Eccentric calcific atherosclerosis results in at least mild to moderate stenosis RIGHT internal  carotid artery origin though not tailored for evaluation. IMPRESSION: Multiple absent teeth and dental caries, no dental abscess. Acute RIGHT maxillary sinusitis. At least mild to moderate RIGHT internal carotid artery stenosis, not tailored for evaluation. Electronically Signed   By: Awilda Metro M.D.   On: 11/26/2015 00:59      Lab Results  Component Value Date   HGBA1C 7.8* 11/26/2015   Lab Results  Component Value Date   CREATININE 0.92 11/27/2015       Scheduled Meds: . aspirin  81 mg Oral Daily  . atorvastatin  40 mg Oral Daily  . dicyclomine  20 mg Oral BID  . glimepiride  4 mg Oral Q breakfast  . heparin  5,000 Units Subcutaneous Q8H  . insulin aspart  0-15 Units Subcutaneous TID WC  . insulin aspart  0-5 Units Subcutaneous QHS  . irbesartan  75 mg Oral Daily  . metoCLOPramide  10 mg Oral Q6H  . metoprolol succinate  50 mg Oral Daily  . piperacillin-tazobactam (ZOSYN)  IV  3.375 g Intravenous Q8H  . sertraline  100 mg Oral QHS   Continuous Infusions: . dextrose 5 % and 0.9% NaCl 125 mL/hr at 11/27/15 1202        Time spent: >30 MINS    [facility]  [facility] Pager [phone]. If 7PM-7AM, please contact night-coverage at [email or web address], password [facility] 11/27/2015, 2:11 PM

## 2015-11-27 NOTE — Progress Notes (Signed)
Pt wanting to go AMA because he said he was told he could go home after his CT, called DR on call and he said notes sound like he is not ready to be d/c due to them wanting GI to see pt tomorrow. Discussed AMA with pt and then he decided he would stay until tomorrow. Pt refusing to wear telemetry at this time.

## 2015-11-28 DIAGNOSIS — G43A1 Cyclical vomiting, intractable: Secondary | ICD-10-CM | POA: Diagnosis not present

## 2015-11-28 DIAGNOSIS — R739 Hyperglycemia, unspecified: Secondary | ICD-10-CM

## 2015-11-28 DIAGNOSIS — D72829 Elevated white blood cell count, unspecified: Secondary | ICD-10-CM | POA: Diagnosis not present

## 2015-11-28 DIAGNOSIS — E860 Dehydration: Secondary | ICD-10-CM | POA: Diagnosis not present

## 2015-11-28 LAB — CBC WITH DIFFERENTIAL/PLATELET
BASOS PCT: 0 %
Basophils Absolute: 0 10*3/uL (ref 0.0–0.1)
EOS ABS: 0.1 10*3/uL (ref 0.0–0.7)
Eosinophils Relative: 1 %
HEMATOCRIT: 46.4 % (ref 39.0–52.0)
HEMOGLOBIN: 15.7 g/dL (ref 13.0–17.0)
LYMPHS ABS: 3.9 10*3/uL (ref 0.7–4.0)
Lymphocytes Relative: 31 %
MCH: 30.4 pg (ref 26.0–34.0)
MCHC: 33.8 g/dL (ref 30.0–36.0)
MCV: 89.7 fL (ref 78.0–100.0)
Monocytes Absolute: 0.7 10*3/uL (ref 0.1–1.0)
Monocytes Relative: 5 %
NEUTROS ABS: 7.9 10*3/uL — AB (ref 1.7–7.7)
NEUTROS PCT: 63 %
Platelets: 210 10*3/uL (ref 150–400)
RBC: 5.17 MIL/uL (ref 4.22–5.81)
RDW: 12.8 % (ref 11.5–15.5)
WBC: 12.5 10*3/uL — AB (ref 4.0–10.5)

## 2015-11-28 LAB — COMPREHENSIVE METABOLIC PANEL
ALBUMIN: 3.2 g/dL — AB (ref 3.5–5.0)
ALK PHOS: 55 U/L (ref 38–126)
ALT: 10 U/L — AB (ref 17–63)
AST: 13 U/L — ABNORMAL LOW (ref 15–41)
Anion gap: 7 (ref 5–15)
BUN: 8 mg/dL (ref 6–20)
CALCIUM: 8.1 mg/dL — AB (ref 8.9–10.3)
CO2: 24 mmol/L (ref 22–32)
CREATININE: 0.9 mg/dL (ref 0.61–1.24)
Chloride: 104 mmol/L (ref 101–111)
GFR calc Af Amer: >60 mL/min (ref >60)
GFR calc non Af Amer: >60 mL/min (ref >60)
GLUCOSE: 103 mg/dL — AB (ref 65–99)
Potassium: 3.3 mmol/L — ABNORMAL LOW (ref 3.5–5.1)
SODIUM: 135 mmol/L (ref 135–145)
Total Bilirubin: 1 mg/dL (ref 0.3–1.2)
Total Protein: 6 g/dL — ABNORMAL LOW (ref 6.5–8.1)

## 2015-11-28 LAB — GLUCOSE, CAPILLARY
Glucose-Capillary: 129 mg/dL — ABNORMAL HIGH (ref 65–99)
Glucose-Capillary: 162 mg/dL — ABNORMAL HIGH (ref 65–99)

## 2015-11-28 MED ORDER — METOCLOPRAMIDE HCL 10 MG PO TABS: 10.0000 mg | ORAL_TABLET | Freq: Four times a day (QID) | ORAL | Status: DC

## 2015-11-28 MED ORDER — SUCRALFATE 1 GM/10ML PO SUSP: 1.0000 g | Freq: Three times a day (TID) | ORAL | Status: DC

## 2015-11-28 MED ORDER — METFORMIN HCL 500 MG PO TABS: 500.0000 mg | ORAL_TABLET | Freq: Two times a day (BID) | ORAL | Status: DC

## 2015-11-28 MED ORDER — PANTOPRAZOLE SODIUM 40 MG PO TBEC
40.0000 mg | DELAYED_RELEASE_TABLET | Freq: Every day | ORAL | Status: DC
Start: 2015-11-28 — End: 2016-10-15

## 2015-11-28 NOTE — Discharge Summary (Signed)
Physician Discharge Summary  [name] [name] MRN: [id number] DOB/AGE: [birth year] 46 y.o.  PCP: LONG,SCOTT, PA-C   Admit date: 11/25/2015 Discharge date: 11/28/2015  Discharge Diagnoses:   Principal Problem:   Intractable nausea and vomiting Active Problems:   Leukocytosis   Volume depletion   Hyperglycemia   Dental abscess   Nausea and vomiting   Dehydration Probable gastroparesis    Follow-up recommendations Follow-up with PCP in 3-5 days , including all  additional recommended appointments as below Follow-up CBC, CMP in 3-5 days        Current Discharge Medication List    START taking these medications   Details  pantoprazole (PROTONIX) 40 MG tablet Take 1 tablet (40 mg total) by mouth daily. Qty: 30 tablet, Refills: 1    sucralfate (CARAFATE) 1 GM/10ML suspension Take 10 mLs (1 g total) by mouth 4 (four) times daily -  with meals and at bedtime. Qty: 420 mL, Refills: 0      CONTINUE these medications which have CHANGED   Details  metFORMIN (GLUCOPHAGE) 500 MG tablet Take 1 tablet (500 mg total) by mouth 2 (two) times daily with a meal.      CONTINUE these medications which have NOT CHANGED   Details  acetaminophen (TYLENOL) 500 MG tablet Take 500 mg by mouth every 6 (six) hours as needed for mild pain or moderate pain.     ALPRAZolam (XANAX) 0.5 MG tablet TAKE 1/2 TO 1 TABLET BY MOUTH DAILY AT BEDTIME *MAY TAKE 3 TIMES DAILY AS NEEDED FOR ANXIETY Refills: 1    aspirin 81 MG chewable tablet Chew by mouth daily.    atorvastatin (LIPITOR) 40 MG tablet Take 40 mg by mouth daily.     Empagliflozin-Linagliptin (GLYXAMBI) 10-5 MG TABS Take 1 tablet by mouth every evening.    Fenofibric Acid 105 MG TABS Take 1 tablet by mouth daily.     glimepiride (AMARYL) 4 MG tablet Take 4 mg by mouth daily with breakfast.    Insulin Degludec (TRESIBA FLEXTOUCH) 200 UNIT/ML SOPN Inject 18 Units into the skin at bedtime.     metoprolol succinate (TOPROL-XL) 50 MG 24 hr  tablet Take 50 mg by mouth daily. Refills: 2    ondansetron (ZOFRAN ODT) 4 MG disintegrating tablet Take 1 tablet (4 mg total) by mouth every 8 (eight) hours as needed for nausea or vomiting. Qty: 20 tablet, Refills: 0    promethazine (PHENERGAN) 25 MG tablet Take 1 tablet (25 mg total) by mouth every 6 (six) hours as needed for nausea or vomiting. Qty: 30 tablet, Refills: 0    sertraline (ZOLOFT) 50 MG tablet Take 100 mg by mouth at bedtime.     valsartan (DIOVAN) 80 MG tablet Take 80 mg by mouth daily.    amoxicillin-clavulanate (AUGMENTIN) 875-125 MG tablet Take 1 tablet by mouth every 12 (twelve) hours. 10 day course starting on 11/13/2015 Refills: 0    benzonatate (TESSALON) 100 MG capsule Take 1 capsule (100 mg total) by mouth every 8 (eight) hours. Qty: 21 capsule, Refills: 0    dicyclomine (BENTYL) 20 MG tablet Take 1 tablet (20 mg total) by mouth 2 (two) times daily. Qty: 20 tablet, Refills: 0    metoCLOPramide (REGLAN) 10 MG tablet Take 1 tablet (10 mg total) by mouth every 6 (six) hours. Qty: 30 tablet, Refills: 0    nitroGLYCERIN (NITROSTAT) 0.4 MG SL tablet Place 0.4 mg under the tongue every 5 (five) minutes as needed for chest pain.    varenicline (  CHANTIX) 1 MG tablet Take 1 mg by mouth 2 (two) times daily.      STOP taking these medications     diphenoxylate-atropine (LOMOTIL) 2.5-0.025 MG tablet      azithromycin (ZITHROMAX) 250 MG tablet          Discharge Condition: Stable  Discharge Instructions Get Medicines reviewed and adjusted: Please take all your medications with you for your next visit with your Primary MD  Please request your Primary MD to go over all hospital tests and procedure/radiological results at the follow up, please ask your Primary MD to get all Hospital records sent to his/her office.  If you experience worsening of your admission symptoms, develop shortness of breath, life threatening emergency, suicidal or homicidal thoughts you  must seek medical attention immediately by calling 911 or calling your MD immediately if symptoms less severe.  You must read complete instructions/literature along with all the possible adverse reactions/side effects for all the Medicines you take and that have been prescribed to you. Take any new Medicines after you have completely understood and accpet all the possible adverse reactions/side effects.   Do not drive when taking Pain medications.   Do not take more than prescribed Pain, Sleep and Anxiety Medications  Special Instructions: If you have smoked or chewed Tobacco in the last 2 yrs please stop smoking, stop any regular Alcohol and or any Recreational drug use.  Wear Seat belts while driving.  Please note  You were cared for by a hospitalist during your hospital stay. Once you are discharged, your primary care physician will handle any further medical issues. Please note that NO REFILLS for any discharge medications will be authorized once you are discharged, as it is imperative that you return to your primary care physician (or establish a relationship with a primary care physician if you do not have one) for your aftercare needs so that they can reassess your need for medications and monitor your lab values.  Discharge Instructions    Diet - low sodium heart healthy    Complete by:  As directed      Increase activity slowly    Complete by:  As directed             Allergies  Allergen Reactions  . Omeprazole Magnesium Swelling      Disposition: 80-Discharged to home/self-care with a planned acute care hospital inpt readmission   Consults:  None     Significant Diagnostic Studies:  Dg Chest 2 View  11/23/2015  CLINICAL DATA:  Burning sensation in chest radiating to throat. Lower abdominal and back pain. EXAM: CHEST  2 VIEW COMPARISON:  Chest x-rays dated 11/18/2015 and 07/29/2014. FINDINGS: Heart size is normal. Cardiomediastinal silhouette is stable in size  and configuration. Lungs are clear. No pleural effusion or pneumothorax seen. Mild degenerative spurring again noted within the thoracic spine. No acute- appearing osseous abnormality. IMPRESSION: No acute findings.  Lungs are clear.  Heart size is normal. Electronically Signed   By: Franki Cabot M.D.   On: 11/23/2015 18:24   Dg Chest 2 View  11/18/2015  CLINICAL DATA:  Acute onset of congestion and shortness of breath. Vomiting. Coughing and gagging. Initial encounter. EXAM: CHEST  2 VIEW COMPARISON:  Chest radiograph performed 07/29/2014 FINDINGS: The lungs are well-aerated and clear. There is no evidence of focal opacification, pleural effusion or pneumothorax. The heart is normal in size; the mediastinal contour is within normal limits. No acute osseous abnormalities are seen. IMPRESSION: No  acute cardiopulmonary process seen. Electronically Signed   By: Garald Balding M.D.   On: 11/18/2015 21:19   Ct Abdomen Pelvis W Contrast  11/27/2015  CLINICAL DATA:  Gradually worsening and intermittent emesis beginning 1 week ago. EXAM: CT ABDOMEN AND PELVIS WITH CONTRAST TECHNIQUE: Multidetector CT imaging of the abdomen and pelvis was performed using the standard protocol following bolus administration of intravenous contrast. CONTRAST:  100 mL of Isovue 370 COMPARISON:  November 20, 2015 CT scan of the abdomen and pelvis FINDINGS: There is a tiny right pleural effusion not seen previously. Lung bases otherwise normal. Foci of air in the subcutaneous tissues of the left abdominal wall, likely due to recent injections. No free air or free fluid. The gallbladder is mildly distended. Otherwise, the liver, gallbladder, portal vein, spleen, adrenal glands, pancreas, kidneys, and ureters are normal. Atherosclerotic change seen in the abdominal aorta which is non aneurysmal. Two mildly prominent gastrohepatic ligament nodes by on series 2, image 26 with the largest measuring 9 mm, probably mildly reactive. No adenopathy is  identified. The stomach and small bowel are normal. The colon is normal. The appendix is surgically absent. No adenopathy or mass in the pelvis. The bladder is unremarkable. There may be a tiny amount of fat in the anterior bladder wall, unchanged since June 22nd 2017. Alternately, this could represent a tiny amount of air from recent bladder catheterization. The bladder is otherwise normal. No other pelvic abnormalities. Degenerative changes in the spine. No other bony abnormalities. IMPRESSION: 1. Tiny right pleural effusion, not seen on the comparison. 2. No acute abnormalities are seen within the abdomen. 3. Atherosclerosis in the abdominal aorta. Electronically Signed   By: Dorise Bullion III M.D   On: 11/27/2015 17:49   Ct Abdomen Pelvis W Contrast  11/20/2015  CLINICAL DATA:  Acute onset of moderate lower abdominal pain and nausea. Non-bloody vomiting. Initial encounter. EXAM: CT ABDOMEN AND PELVIS WITH CONTRAST TECHNIQUE: Multidetector CT imaging of the abdomen and pelvis was performed using the standard protocol following bolus administration of intravenous contrast. CONTRAST:  154m ISOVUE-300 IOPAMIDOL (ISOVUE-300) INJECTION 61% COMPARISON:  Abdominal radiograph performed 11/18/2015 FINDINGS: The visualized lung bases are clear. The liver and spleen are unremarkable in appearance. The gallbladder is within normal limits. The pancreas and adrenal glands are unremarkable. The kidneys are unremarkable in appearance. There is no evidence of hydronephrosis. No renal or ureteral stones are seen. Nonspecific perinephric stranding is noted bilaterally. No free fluid is identified. The small bowel is unremarkable in appearance. The stomach is within normal limits. No acute vascular abnormalities are seen. Scattered calcification is noted along the abdominal aorta and its branches. The patient is status post appendectomy. The colon is grossly unremarkable in appearance. The bladder is moderately distended and  grossly unremarkable. A small urachal remnant is incidentally seen. The prostate remains normal in size. No inguinal lymphadenopathy is seen. No acute osseous abnormalities are identified. IMPRESSION: 1. No acute abnormality seen within the abdomen or pelvis. 2. Scattered calcification along the abdominal aorta and its branches. Electronically Signed   By: JGarald BaldingM.D.   On: 11/20/2015 03:19   Ct Maxillofacial W Contrast  11/26/2015  CLINICAL DATA:  Recent diagnosis of dental abscess with subsequent treatment. Leukocytosis and continued dental pain. Intermittent vomiting for 1 week. History of diabetes. EXAM: CT MAXILLOFACIAL WITH CONTRAST TECHNIQUE: Multidetector CT imaging of the maxillofacial structures was performed with intravenous contrast. Multiplanar CT image reconstructions were also generated. A small metallic BB was placed  on the right temple in order to reliably differentiate right from left. CONTRAST:  45m ISOVUE-300 IOPAMIDOL (ISOVUE-300) INJECTION 61% COMPARISON:  None. FINDINGS: FACIAL BONES: The mandible is intact, the condyles are located. No acute facial fracture. No destructive bony lesions. Multiple absent teeth. Scattered dental caries. Probable bone island RIGHT mandible body. SINUSES: RIGHT maxillary sinus air-fluid level. Scattered sub cm mucosal retention cyst. Mastoid air cells are well aerated. Nasal septum slightly deviated to the RIGHT. Soft tissue effaces the RIGHT ostiomeatal unit. ORBITS: Ocular globes and orbital contents are unremarkable. SOFT TISSUES: No significant soft tissue swelling. No subcutaneous gas or radiopaque foreign bodies. Eccentric calcific atherosclerosis results in at least mild to moderate stenosis RIGHT internal carotid artery origin though not tailored for evaluation. IMPRESSION: Multiple absent teeth and dental caries, no dental abscess. Acute RIGHT maxillary sinusitis. At least mild to moderate RIGHT internal carotid artery stenosis, not tailored  for evaluation. Electronically Signed   By: CElon AlasM.D.   On: 11/26/2015 00:59   Dg Abd 2 Views  11/18/2015  CLINICAL DATA:  Pt c/o productive cough, congestion, N/V, and ABD pain x 1 wk. CXR earlier tonight. Hx HTN, diabetes, appendectomy, smoker EXAM: ABDOMEN - 2 VIEW COMPARISON:  Chest from earlier the same day FINDINGS: Visualized lung bases clear. No free air. Normal bowel gas pattern. No abnormal abdominal calcifications. Regional bones unremarkable. IMPRESSION: Negative. Electronically Signed   By: DLucrezia EuropeM.D.   On: 11/18/2015 22:54     2-D echo none    Filed Weights   11/25/15 1924 11/26/15 0035  Weight: 79.833 kg (176 lb) 78.79 kg (173 lb 11.2 oz)     Microbiology: Recent Results (from the past 240 hour(s))  Urine culture     Status: None   Collection Time: 11/23/15  7:00 PM  Result Value Ref Range Status   Specimen Description URINE, RANDOM  Final   Special Requests augmentin Normal  Final   Culture NO GROWTH  Final   Report Status 11/24/2015 FINAL  Final  Culture, blood (Routine X 2) w Reflex to ID Panel     Status: None (Preliminary result)   Collection Time: 11/25/15 12:00 AM  Result Value Ref Range Status   Specimen Description BLOOD RIGHT ANTECUBITAL  Final   Special Requests BOTTLES DRAWN AEROBIC AND ANAEROBIC 4CC EACH  Final   Culture NO GROWTH 2 DAYS  Final   Report Status PENDING  Incomplete  Culture, blood (Routine X 2) w Reflex to ID Panel     Status: None (Preliminary result)   Collection Time: 11/25/15 11:40 PM  Result Value Ref Range Status   Specimen Description BLOOD RIGHT ANTECUBITAL  Final   Special Requests BOTTLES DRAWN AEROBIC AND ANAEROBIC 6CC EACH  Final   Culture NO GROWTH 2 DAYS  Final   Report Status PENDING  Incomplete       Blood Culture    Component Value Date/Time   SDES BLOOD RIGHT ANTECUBITAL 11/25/2015 2340   SPECREQUEST BOTTLES DRAWN AEROBIC AND ANAEROBIC 6CC EACH 11/25/2015 2340   CULT NO GROWTH 2 DAYS  11/25/2015 2340   REPTSTATUS PENDING 11/25/2015 2340      Labs: Results for orders placed or performed during the hospital encounter of 11/25/15 (from the past 48 hour(s))  Glucose, capillary     Status: Abnormal   Collection Time: 11/26/15 11:08 AM  Result Value Ref Range   Glucose-Capillary 172 (H) 65 - 99 mg/dL   Comment 1 Notify RN  Comment 2 Document in Chart   Glucose, capillary     Status: Abnormal   Collection Time: 11/26/15  4:19 PM  Result Value Ref Range   Glucose-Capillary 139 (H) 65 - 99 mg/dL   Comment 1 Notify RN    Comment 2 Document in Chart   Urine rapid drug screen (hosp performed)     Status: Abnormal   Collection Time: 11/26/15  5:00 PM  Result Value Ref Range   Opiates NONE DETECTED NONE DETECTED   Cocaine NONE DETECTED NONE DETECTED   Benzodiazepines POSITIVE (A) NONE DETECTED   Amphetamines NONE DETECTED NONE DETECTED   Tetrahydrocannabinol NONE DETECTED NONE DETECTED   Barbiturates NONE DETECTED NONE DETECTED    Comment:        DRUG SCREEN FOR MEDICAL PURPOSES ONLY.  IF CONFIRMATION IS NEEDED FOR ANY PURPOSE, NOTIFY LAB WITHIN 5 DAYS.        LOWEST DETECTABLE LIMITS FOR URINE DRUG SCREEN Drug Class       Cutoff (ng/mL) Amphetamine      1000 Barbiturate      200 Benzodiazepine   503 Tricyclics       546 Opiates          300 Cocaine          300 THC              50   Glucose, capillary     Status: Abnormal   Collection Time: 11/26/15  8:35 PM  Result Value Ref Range   Glucose-Capillary 155 (H) 65 - 99 mg/dL   Comment 1 Notify RN    Comment 2 Document in Chart   CBC WITH DIFFERENTIAL     Status: Abnormal   Collection Time: 11/27/15  5:47 AM  Result Value Ref Range   WBC 12.6 (H) 4.0 - 10.5 K/uL   RBC 5.05 4.22 - 5.81 MIL/uL   Hemoglobin 15.7 13.0 - 17.0 g/dL   HCT 46.0 39.0 - 52.0 %   MCV 91.1 78.0 - 100.0 fL   MCH 31.1 26.0 - 34.0 pg   MCHC 34.1 30.0 - 36.0 g/dL   RDW 13.2 11.5 - 15.5 %   Platelets 217 150 - 400 K/uL    Neutrophils Relative % 65 %   Neutro Abs 8.2 (H) 1.7 - 7.7 K/uL   Lymphocytes Relative 28 %   Lymphs Abs 3.6 0.7 - 4.0 K/uL   Monocytes Relative 6 %   Monocytes Absolute 0.7 0.1 - 1.0 K/uL   Eosinophils Relative 1 %   Eosinophils Absolute 0.1 0.0 - 0.7 K/uL   Basophils Relative 0 %   Basophils Absolute 0.0 0.0 - 0.1 K/uL  Comprehensive metabolic panel     Status: Abnormal   Collection Time: 11/27/15  5:47 AM  Result Value Ref Range   Sodium 136 135 - 145 mmol/L   Potassium 3.9 3.5 - 5.1 mmol/L   Chloride 105 101 - 111 mmol/L   CO2 26 22 - 32 mmol/L   Glucose, Bld 139 (H) 65 - 99 mg/dL   BUN 11 6 - 20 mg/dL   Creatinine, Ser 0.92 0.61 - 1.24 mg/dL   Calcium 8.1 (L) 8.9 - 10.3 mg/dL   Total Protein 6.0 (L) 6.5 - 8.1 g/dL   Albumin 3.1 (L) 3.5 - 5.0 g/dL   AST 11 (L) 15 - 41 U/L   ALT 10 (L) 17 - 63 U/L   Alkaline Phosphatase 58 38 - 126 U/L   Total Bilirubin 0.9  0.3 - 1.2 mg/dL   GFR calc non Af Amer >60 >60 mL/min   GFR calc Af Amer >60 >60 mL/min    Comment: (NOTE) The eGFR has been calculated using the CKD EPI equation. This calculation has not been validated in all clinical situations. eGFR's persistently <60 mL/min signify possible Chronic Kidney Disease.    Anion gap 5 5 - 15  Glucose, capillary     Status: Abnormal   Collection Time: 11/27/15  7:43 AM  Result Value Ref Range   Glucose-Capillary 173 (H) 65 - 99 mg/dL  Glucose, capillary     Status: Abnormal   Collection Time: 11/27/15 11:45 AM  Result Value Ref Range   Glucose-Capillary 111 (H) 65 - 99 mg/dL  Glucose, capillary     Status: Abnormal   Collection Time: 11/27/15  4:05 PM  Result Value Ref Range   Glucose-Capillary 115 (H) 65 - 99 mg/dL  Glucose, capillary     Status: None   Collection Time: 11/27/15  8:44 PM  Result Value Ref Range   Glucose-Capillary 78 65 - 99 mg/dL   Comment 1 Notify RN    Comment 2 Document in Chart   CBC WITH DIFFERENTIAL     Status: Abnormal   Collection Time: 11/28/15   5:31 AM  Result Value Ref Range   WBC 12.5 (H) 4.0 - 10.5 K/uL   RBC 5.17 4.22 - 5.81 MIL/uL   Hemoglobin 15.7 13.0 - 17.0 g/dL   HCT 46.4 39.0 - 52.0 %   MCV 89.7 78.0 - 100.0 fL   MCH 30.4 26.0 - 34.0 pg   MCHC 33.8 30.0 - 36.0 g/dL   RDW 12.8 11.5 - 15.5 %   Platelets 210 150 - 400 K/uL   Neutrophils Relative % 63 %   Neutro Abs 7.9 (H) 1.7 - 7.7 K/uL   Lymphocytes Relative 31 %   Lymphs Abs 3.9 0.7 - 4.0 K/uL   Monocytes Relative 5 %   Monocytes Absolute 0.7 0.1 - 1.0 K/uL   Eosinophils Relative 1 %   Eosinophils Absolute 0.1 0.0 - 0.7 K/uL   Basophils Relative 0 %   Basophils Absolute 0.0 0.0 - 0.1 K/uL  Comprehensive metabolic panel     Status: Abnormal   Collection Time: 11/28/15  5:31 AM  Result Value Ref Range   Sodium 135 135 - 145 mmol/L   Potassium 3.3 (L) 3.5 - 5.1 mmol/L   Chloride 104 101 - 111 mmol/L   CO2 24 22 - 32 mmol/L   Glucose, Bld 103 (H) 65 - 99 mg/dL   BUN 8 6 - 20 mg/dL   Creatinine, Ser 0.90 0.61 - 1.24 mg/dL   Calcium 8.1 (L) 8.9 - 10.3 mg/dL   Total Protein 6.0 (L) 6.5 - 8.1 g/dL   Albumin 3.2 (L) 3.5 - 5.0 g/dL   AST 13 (L) 15 - 41 U/L   ALT 10 (L) 17 - 63 U/L   Alkaline Phosphatase 55 38 - 126 U/L   Total Bilirubin 1.0 0.3 - 1.2 mg/dL   GFR calc non Af Amer >60 >60 mL/min   GFR calc Af Amer >60 >60 mL/min    Comment: (NOTE) The eGFR has been calculated using the CKD EPI equation. This calculation has not been validated in all clinical situations. eGFR's persistently <60 mL/min signify possible Chronic Kidney Disease.    Anion gap 7 5 - 15  Glucose, capillary     Status: Abnormal   Collection Time: 11/28/15  7:33 AM  Result Value Ref Range   Glucose-Capillary 129 (H) 65 - 99 mg/dL     Lipid Panel  No results found for: CHOL, TRIG, HDL, CHOLHDL, VLDL, LDLCALC, LDLDIRECT   Lab Results  Component Value Date   HGBA1C 7.8* 11/26/2015     Lab Results  Component Value Date   CREATININE 0.90 11/28/2015     45 y.o. male with  medical history significant of HTN, HLD, and DM Type 2 presented with complaints of gradually worsening and intermittent emesis that onset one week ago. Patient reports that when symptoms inially began he was seen by his PCP who started him on an Augmentin with no relief. He was then prescribed another course and also given Zofran which also provided no relief for nausea and vomiting. Per charts patient has had multiple hospital and outpatient visits for his symptoms. Patient has been seen by Dr. Imagene Gurney, Oncology-hematology, for further investigation of elevating WBC. He will be admitted for emesis, dehydration and leucocytosis.  Assessment and plan Leukocytosis-likely reactive vs secondary to acute sinusitis, no pneumonia, no UTI , white count improving as her Has previously been seen by hematology oncology, per Dr. Whitney Muse leukocytosis was thought to be reactive likely secondary to smoking Patient also was recently prescribed prednisone for COPD exacerbation   Acute right maxillary sinusitis-initially was on Augmentin outpatient, was switched to Zosyn, nausea vomiting is not improving, recommend patient switch back to Augmentin and complete full course of oral antibiotic treatment  Diabetes mellitus type 2, on SSI, takes glyxambi, Amaryl, Glucophage outpatient which will be resumed. Hemoglobin A1c 7.8  Intractable nausea vomiting for one week could be secondary to postnasal drip secondary to acute sinusitis, cannot rule out gastroparesis, viral gastroenteritis, some improvement on Reglan which is patient's home medication/as well as antireflux measures. No evidence of DKA. UDS negative for marijuana, CT scan on 6/22 was negative. Repeat CT scan abdomen and pelvis on 6/29 does not show any acute etiology of the patient's intractable nausea and vomiting. However symptoms are improved after starting antireflux therapy. Will continue PPI and Carafate, recommend outpatient GI follow-up if the patient's  symptoms recur   Essential hypertension-controlled continue metoprolol,     Discharge Exam:   Blood pressure 148/76, pulse 57, temperature 98.9 F (37.2 C), temperature source Oral, resp. rate 20, height _0  (1.727 m), weight 78.79 kg (173 lb 11.2 oz), SpO2 97 %.  Cardiovascular: Regular rate and rhythm, no murmurs / rubs / gallops. No extremity edema.  Abdomen: no tenderness, no masses palpated.  Musculoskeletal: no clubbing / cyanosis. No joint deformity upper and lower extremities. Good ROM, no contractures. Normal muscle tone.  Skin: no rashes, lesions, ulcers. No induration Neurologic: CN 2-12 grossly intact. Sensation intact, DTR normal. Strength 5/5 in all 4.  Psychiatric: Normal judgment and insight. Alert and oriented x 3. Normal mood.     Follow-up Information    Follow up with LONG,SCOTT, PA-C. Schedule an appointment as soon as possible for a visit in 3 days.   Specialty:  Physician Assistant   Contact information:   [place] [id number] [phone]       Signed: Reyne Dumas 11/28/2015, 9:48 AM        Time spent >45 mins

## 2015-11-28 NOTE — Care Management Note (Signed)
Case Management Note  Patient Details  Name: Rennis PettyDanny L Mellette MRN: [id number] Date of Birth: [birth year]   Expected Discharge Date:  11/28/15               Expected Discharge Plan:  Home/Self Care  In-House Referral:     Discharge planning Services  CM Consult  Post Acute Care Choice:  NA Choice offered to:  NA  DME Arranged:    DME Agency:     HH Arranged:    HH Agency:     Status of Service:  Completed, signed off  If discussed at [employer] Length of Stay Meetings, dates discussed:    Additional Comments: Patient d/c home with sefl care. No CM needs.   Janalynn Eder, Chrystine OilerSharley Diane, RN 11/28/2015, 11:46 AM

## 2015-11-28 NOTE — Plan of Care (Signed)
Contacted Mom, per her request and patient verbal consent.  Informed of current status and POC at this time.

## 2015-11-28 NOTE — Discharge Planning (Signed)
Patient IV removed and DC paper given, explained and educated.  Told of suggested FU appts and also scripts sent to pharm.  RN assessment and VS revealed stability for DC to home.  Pt will be walked to front and family transporting home via car.

## 2015-11-30 LAB — CULTURE, BLOOD (ROUTINE X 2)
Culture: NO GROWTH
Culture: NO GROWTH

## 2015-12-05 ENCOUNTER — Ambulatory Visit (HOSPITAL_COMMUNITY): Payer: 59 | Admitting: Hematology & Oncology

## 2015-12-05 ENCOUNTER — Other Ambulatory Visit (HOSPITAL_COMMUNITY): Payer: 59

## 2015-12-07 NOTE — Progress Notes (Signed)
This encounter was created in error - please disregard.

## 2016-10-12 ENCOUNTER — Emergency Department (HOSPITAL_COMMUNITY)
Admission: EM | Admit: 2016-10-12 | Discharge: 2016-10-12 | Disposition: A | Discharge status: Home / Self Care | Payer: 59 | Attending: Emergency Medicine | Admitting: Emergency Medicine

## 2016-10-12 ENCOUNTER — Encounter (HOSPITAL_COMMUNITY): Payer: Self-pay | Admitting: Emergency Medicine

## 2016-10-12 DIAGNOSIS — Z7984 Long term (current) use of oral hypoglycemic drugs: Secondary | ICD-10-CM | POA: Insufficient documentation

## 2016-10-12 DIAGNOSIS — R197 Diarrhea, unspecified: Secondary | ICD-10-CM

## 2016-10-12 DIAGNOSIS — F17210 Nicotine dependence, cigarettes, uncomplicated: Secondary | ICD-10-CM | POA: Insufficient documentation

## 2016-10-12 DIAGNOSIS — E872 Acidosis, unspecified: Secondary | ICD-10-CM

## 2016-10-12 DIAGNOSIS — Z79899 Other long term (current) drug therapy: Secondary | ICD-10-CM | POA: Insufficient documentation

## 2016-10-12 DIAGNOSIS — E119 Type 2 diabetes mellitus without complications: Secondary | ICD-10-CM | POA: Insufficient documentation

## 2016-10-12 DIAGNOSIS — Z7982 Long term (current) use of aspirin: Secondary | ICD-10-CM | POA: Insufficient documentation

## 2016-10-12 DIAGNOSIS — R112 Nausea with vomiting, unspecified: Secondary | ICD-10-CM | POA: Diagnosis present

## 2016-10-12 DIAGNOSIS — I10 Essential (primary) hypertension: Secondary | ICD-10-CM | POA: Diagnosis not present

## 2016-10-12 LAB — COMPREHENSIVE METABOLIC PANEL
ALT: 16 U/L — ABNORMAL LOW (ref 17–63)
AST: 18 U/L (ref 15–41)
Albumin: 4.1 g/dL (ref 3.5–5.0)
Alkaline Phosphatase: 96 U/L (ref 38–126)
Anion gap: 19 — ABNORMAL HIGH (ref 5–15)
BUN: 18 mg/dL (ref 6–20)
CO2: 19 mmol/L — ABNORMAL LOW (ref 22–32)
Calcium: 9.9 mg/dL (ref 8.9–10.3)
Chloride: 100 mmol/L — ABNORMAL LOW (ref 101–111)
Creatinine, Ser: 1.15 mg/dL (ref 0.61–1.24)
GFR calc Af Amer: >60 mL/min (ref >60)
GFR calc non Af Amer: >60 mL/min (ref >60)
Glucose, Bld: 276 mg/dL — ABNORMAL HIGH (ref 65–99)
Potassium: 4.5 mmol/L (ref 3.5–5.1)
Sodium: 138 mmol/L (ref 135–145)
Total Bilirubin: 1.4 mg/dL — ABNORMAL HIGH (ref 0.3–1.2)
Total Protein: 7.5 g/dL (ref 6.5–8.1)

## 2016-10-12 LAB — URINALYSIS, ROUTINE W REFLEX MICROSCOPIC
Bacteria, UA: NONE SEEN
Bilirubin Urine: NEGATIVE
Glucose, UA: >=500 mg/dL — AB
Hgb urine dipstick: NEGATIVE
Ketones, ur: 80 mg/dL — AB
Leukocytes, UA: NEGATIVE
Nitrite: NEGATIVE
Protein, ur: 30 mg/dL — AB
Specific Gravity, Urine: 1.032 — ABNORMAL HIGH (ref 1.005–1.030)
pH: 5 (ref 5.0–8.0)

## 2016-10-12 LAB — CBC
HCT: 51.7 % (ref 39.0–52.0)
Hemoglobin: 18.5 g/dL — ABNORMAL HIGH (ref 13.0–17.0)
MCH: 32.3 pg (ref 26.0–34.0)
MCHC: 35.8 g/dL (ref 30.0–36.0)
MCV: 90.4 fL (ref 78.0–100.0)
Platelets: 243 10*3/uL (ref 150–400)
RBC: 5.72 MIL/uL (ref 4.22–5.81)
RDW: 12.3 % (ref 11.5–15.5)
WBC: 20.5 10*3/uL — ABNORMAL HIGH (ref 4.0–10.5)

## 2016-10-12 LAB — LIPASE, BLOOD: Lipase: 19 U/L (ref 11–51)

## 2016-10-12 MED ORDER — ONDANSETRON 4 MG PO TBDP
ORAL_TABLET | ORAL | Status: AC
  Filled 2016-10-12: qty 1

## 2016-10-12 MED ORDER — GI COCKTAIL ~~LOC~~
30.0000 mL | Freq: Once | ORAL | Status: AC
  Administered 2016-10-12: 30 mL via ORAL
  Filled 2016-10-12: qty 30

## 2016-10-12 MED ORDER — ONDANSETRON HCL 4 MG PO TABS: 4.0000 mg | ORAL_TABLET | Freq: Four times a day (QID) | ORAL | 0 refills | Status: DC

## 2016-10-12 MED ORDER — SODIUM CHLORIDE 0.9 % IV BOLUS (SEPSIS)
1000.0000 mL | Freq: Once | INTRAVENOUS | Status: AC
Start: 2016-10-12 — End: 2016-10-12
  Administered 2016-10-12: 1000 mL via INTRAVENOUS

## 2016-10-12 MED ORDER — INSULIN ASPART 100 UNIT/ML ~~LOC~~ SOLN
6.0000 [IU] | Freq: Once | SUBCUTANEOUS | Status: AC
  Administered 2016-10-12: 6 [IU] via INTRAVENOUS
  Filled 2016-10-12: qty 1

## 2016-10-12 MED ORDER — PROMETHAZINE HCL 25 MG/ML IJ SOLN
12.5000 mg | Freq: Once | INTRAMUSCULAR | Status: AC
Start: 2016-10-12 — End: 2016-10-12
  Administered 2016-10-12: 12.5 mg via INTRAVENOUS
  Filled 2016-10-12: qty 1

## 2016-10-12 MED ORDER — MORPHINE SULFATE (PF) 4 MG/ML IV SOLN
4.0000 mg | Freq: Once | INTRAVENOUS | Status: AC
  Administered 2016-10-12: 4 mg via INTRAVENOUS
  Filled 2016-10-12: qty 1

## 2016-10-12 MED ORDER — ONDANSETRON 4 MG PO TBDP
4.0000 mg | ORAL_TABLET | Freq: Once | ORAL | Status: AC | PRN
  Administered 2016-10-12: 4 mg via ORAL

## 2016-10-12 MED ORDER — LOPERAMIDE HCL 2 MG PO CAPS
4.0000 mg | ORAL_CAPSULE | Freq: Once | ORAL | Status: AC
  Administered 2016-10-12: 4 mg via ORAL
  Filled 2016-10-12: qty 2

## 2016-10-12 NOTE — ED Provider Notes (Signed)
[facility]EM[facility]Y DEPT Provider Note   CSN: [id number] Arrival date & time: 10/12/16  1041   By signing my name below, I, Soijett Blue, attest that this documentation has been prepared under the direction and in the presence of Raeford RazorKohut, Uilani Sanville, MD. Electronically Signed: Soijett Blue, ED Scribe. 10/12/16. 1:29 PM.  History   Chief Complaint Chief Complaint  Patient presents with  . Emesis    HPI [name] [name] is a 46 y.o. male with a PMHx of HTN, DM, who presents to the Emergency Department complaining of blood-tinged emesis onset 2 days ago. Pt reports associated nausea, lower abdominal pain, chills, burning sensation to chest due to vomiting, dark colored stools, and diarrhea. Pt has tried tylenol, pepto-bismol, and OTC medications, with no relief of his symptoms. He denies melena, blood in stool, fever, and any other symptoms. Pt notes that he has had an appendectomy. He reports that his blood sugar levels have ranged from 170-240 within the past couple days.     The history is provided by the patient. No language interpreter was used.    Past Medical History:  Diagnosis Date  . Atypical chest pain   . Diabetes mellitus without complication (HCC)   . Hyperlipemia   . Hypertension   . Malaise and fatigue     Patient Active Problem List   Diagnosis Date Noted  . Dehydration   . Leukocytosis 11/25/2015  . Intractable nausea and vomiting 11/25/2015  . Volume depletion 11/25/2015  . Hyperglycemia 11/25/2015  . Dental abscess 11/25/2015  . Nausea and vomiting 11/25/2015  . Angina pectoris (HCC) 03/27/2015    Past Surgical History:  Procedure Laterality Date  . ANKLE SURGERY  1982  . APPENDECTOMY  1975  . CARDIAC CATHETERIZATION N/A 03/28/2015   Procedure: Left Heart Cath and Coronary Angiography;  Surgeon: Yates DecampJay Ganji, MD;  [facility];  Service: Cardiovascular;  Laterality: N/A;  . CARDIAC CATHETERIZATION N/A 03/28/2015   Procedure: Intravascular  Pressure Wire/FFR Study;  Surgeon: Yates DecampJay Ganji, MD;  [facility];  Service: Cardiovascular;  Laterality: N/A;       Home Medications    Prior to Admission medications   Medication Sig Start Date End Date Taking? Authorizing Provider  acetaminophen (TYLENOL) 500 MG tablet Take 500 mg by mouth every 6 (six) hours as needed for mild pain or moderate pain.     [provider]  ALPRAZolam (XANAX) 0.5 MG tablet TAKE 1/2 TO 1 TABLET BY MOUTH DAILY AT BEDTIME *MAY TAKE 3 TIMES DAILY AS NEEDED FOR ANXIETY 11/06/15   [provider]  amoxicillin-clavulanate (AUGMENTIN) 875-125 MG tablet Take 1 tablet by mouth every 12 (twelve) hours. 10 day course starting on 11/13/2015 11/13/15   [provider]  aspirin 81 MG chewable tablet Chew by mouth daily.    [provider]  atorvastatin (LIPITOR) 40 MG tablet Take 40 mg by mouth daily.  07/29/14   [provider]  benzonatate (TESSALON) 100 MG capsule Take 1 capsule (100 mg total) by mouth every 8 (eight) hours. 11/23/15   Sidney Aceuch, Alison Charruf, MD  dicyclomine (BENTYL) 20 MG tablet Take 1 tablet (20 mg total) by mouth 2 (two) times daily. 11/20/15   Gilda CreasePollina, Christopher J, MD  Empagliflozin-Linagliptin (GLYXAMBI) 10-5 MG TABS Take 1 tablet by mouth every evening.    [provider]  Fenofibric Acid 105 MG TABS Take 1 tablet by mouth daily.     [provider]  glimepiride (AMARYL) 4 MG  tablet Take 4 mg by mouth daily with breakfast.    [provider]  Insulin Degludec (TRESIBA FLEXTOUCH) 200 UNIT/ML SOPN Inject 18 Units into the skin at bedtime.     [provider]  metFORMIN (GLUCOPHAGE) 500 MG tablet Take 1 tablet (500 mg total) by mouth 2 (two) times daily with a meal. 12/01/15   Richarda Overlie, MD  metoCLOPramide (REGLAN) 10 MG tablet Take 1 tablet (10 mg total) by mouth every 6 (six) hours. 11/28/15   Richarda Overlie, MD  metoprolol succinate (TOPROL-XL) 50 MG 24 hr tablet  Take 50 mg by mouth daily. 10/28/15   [provider]  nitroGLYCERIN (NITROSTAT) 0.4 MG SL tablet Place 0.4 mg under the tongue every 5 (five) minutes as needed for chest pain.    [provider]  ondansetron (ZOFRAN ODT) 4 MG disintegrating tablet Take 1 tablet (4 mg total) by mouth every 8 (eight) hours as needed for nausea or vomiting. 11/23/15   Ruch, Gordy Councilman, MD  pantoprazole (PROTONIX) 40 MG tablet Take 1 tablet (40 mg total) by mouth daily. 11/28/15   Richarda Overlie, MD  promethazine (PHENERGAN) 25 MG tablet Take 1 tablet (25 mg total) by mouth every 6 (six) hours as needed for nausea or vomiting. 11/18/15   Rancour, Jeannett Senior, MD  sertraline (ZOLOFT) 50 MG tablet Take 100 mg by mouth at bedtime.     [provider]  sucralfate (CARAFATE) 1 GM/10ML suspension Take 10 mLs (1 g total) by mouth 4 (four) times daily -  with meals and at bedtime. 11/28/15   Richarda Overlie, MD  valsartan (DIOVAN) 80 MG tablet Take 80 mg by mouth daily.    [provider]  varenicline (CHANTIX) 1 MG tablet Take 1 mg by mouth 2 (two) times daily.    [provider]    Family History Family History  Problem Relation Age of Onset  . Congestive Heart Failure Sister   . Diabetes Mother   . Hypertension Mother   . Diabetes Father   . Hypertension Father     Social History Social History  Substance Use Topics  . Smoking status: Current Every Day Smoker    Packs/day: 1.00    Years: 29.00    Types: Cigarettes  . Smokeless tobacco: Not on file  . Alcohol use No     Allergies   Omeprazole magnesium   Review of Systems Review of Systems  Constitutional: Positive for chills. Negative for fever.  Gastrointestinal: Positive for abdominal pain (lower), diarrhea, nausea and vomiting (blood-tinged). Negative for blood in stool.       No melena  All other systems reviewed and are negative.    Physical Exam Updated Vital Signs BP (!) 141/109 (BP Location: Left  Arm)   Pulse (!) 113   Temp 97.6 F (36.4 C) (Oral)   Resp (!) 24   SpO2 99%   Physical Exam  Constitutional: He is oriented to person, place, and time. He appears well-developed and well-nourished.  HENT:  Head: Normocephalic and atraumatic.  Right Ear: External ear normal.  Left Ear: External ear normal.  Nose: Nose normal.  Eyes: Right eye exhibits no discharge. Left eye exhibits no discharge.  Neck: Neck supple.  Cardiovascular: Normal rate, regular rhythm and normal heart sounds.  Exam reveals no gallop and no friction rub.   Pulmonary/Chest: Effort normal and breath sounds normal. No respiratory distress. He has no wheezes. He has no rales.  Abdominal: Soft. There is tenderness.  Lower  abdomen is TTP.   Musculoskeletal: He exhibits no edema.  Neurological: He is alert and oriented to person, place, and time.  Skin: Skin is warm and dry.  Nursing note and vitals reviewed.    ED Treatments / Results  DIAGNOSTIC STUDIES: Oxygen Saturation is 99% on RA, nl by my interpretation.    COORDINATION OF CARE: 1:26 PM Discussed treatment plan with pt at bedside which includes labs, UA, and pt agreed to plan.   Labs (all labs ordered are listed, but only abnormal results are displayed) Labs Reviewed  COMPREHENSIVE METABOLIC PANEL - Abnormal; Notable for the following:       Result Value   Chloride 100 (*)    CO2 19 (*)    Glucose, Bld 276 (*)    ALT 16 (*)    Total Bilirubin 1.4 (*)    Anion gap 19 (*)    All other components within normal limits  CBC - Abnormal; Notable for the following:    WBC 20.5 (*)    Hemoglobin 18.5 (*)    All other components within normal limits  URINALYSIS, ROUTINE W REFLEX MICROSCOPIC - Abnormal; Notable for the following:    Color, Urine STRAW (*)    Specific Gravity, Urine 1.032 (*)    Glucose, UA >=500 (*)    Ketones, ur 80 (*)    Protein, ur 30 (*)    Squamous Epithelial / LPF 0-5 (*)    All other components within normal limits    LIPASE, BLOOD    EKG  EKG Interpretation None       Radiology No results found.  Procedures Procedures (including critical care time)  Medications Ordered in ED Medications  ondansetron (ZOFRAN-ODT) 4 MG disintegrating tablet (not administered)  ondansetron (ZOFRAN-ODT) disintegrating tablet 4 mg (4 mg Oral Given 10/12/16 1059)     Initial Impression / Assessment and Plan / ED Course  I have reviewed the triage vital signs and the nursing notes.  Pertinent labs & imaging results that were available during my care of the patient were reviewed by me and considered in my medical decision making (see chart for details).     46yM with n/v/d. May be viral illness. Concerned about mild DKA though. Mild metabolic acidosis, increased anion gap, ketones on UA. Pt is feeling better and would like to go home. Agreeable to a small dose of insulin prior to this. His HR is coming down. He does look better. Drinking water w/o problem. Advised to stay well hydrated. He reports compliance with his meds and declines offer for refills if needed. Return precautions discussed.   Final Clinical Impressions(s) / ED Diagnoses   Final diagnoses:  Nausea vomiting and diarrhea  Metabolic acidosis    New Prescriptions New Prescriptions   No medications on file   I personally preformed the services scribed in my presence. The recorded information has been reviewed is accurate. Raeford Razor, MD.     Raeford Razor, MD 10/12/16 [phone]

## 2016-10-12 NOTE — ED Triage Notes (Signed)
Pt sts N/V and abd pain x 2 days; pt hyperventilating

## 2016-10-12 NOTE — ED Notes (Signed)
Declined W/C at D/C and was escorted to lobby by RN. 

## 2016-10-12 NOTE — ED Notes (Signed)
Pt stated too dehydrated to give urine sample at this time.

## 2016-10-13 ENCOUNTER — Encounter (HOSPITAL_COMMUNITY): Payer: Self-pay | Admitting: *Deleted

## 2016-10-13 ENCOUNTER — Emergency Department (HOSPITAL_COMMUNITY)
Admission: EM | Admit: 2016-10-13 | Discharge: 2016-10-13 | Disposition: A | Discharge status: Home / Self Care | Payer: 59 | Attending: Emergency Medicine | Admitting: Emergency Medicine

## 2016-10-13 DIAGNOSIS — Z7982 Long term (current) use of aspirin: Secondary | ICD-10-CM | POA: Insufficient documentation

## 2016-10-13 DIAGNOSIS — R1110 Vomiting, unspecified: Secondary | ICD-10-CM | POA: Diagnosis present

## 2016-10-13 DIAGNOSIS — I10 Essential (primary) hypertension: Secondary | ICD-10-CM | POA: Insufficient documentation

## 2016-10-13 DIAGNOSIS — Z79899 Other long term (current) drug therapy: Secondary | ICD-10-CM | POA: Diagnosis not present

## 2016-10-13 DIAGNOSIS — E119 Type 2 diabetes mellitus without complications: Secondary | ICD-10-CM | POA: Insufficient documentation

## 2016-10-13 DIAGNOSIS — Z794 Long term (current) use of insulin: Secondary | ICD-10-CM | POA: Insufficient documentation

## 2016-10-13 DIAGNOSIS — R112 Nausea with vomiting, unspecified: Secondary | ICD-10-CM

## 2016-10-13 DIAGNOSIS — F17210 Nicotine dependence, cigarettes, uncomplicated: Secondary | ICD-10-CM | POA: Diagnosis not present

## 2016-10-13 LAB — URINALYSIS, ROUTINE W REFLEX MICROSCOPIC
BACTERIA UA: NONE SEEN
BILIRUBIN URINE: NEGATIVE
Glucose, UA: >=500 mg/dL — AB
Hgb urine dipstick: NEGATIVE
Ketones, ur: 80 mg/dL — AB
LEUKOCYTES UA: NEGATIVE
NITRITE: NEGATIVE
PH: 5 (ref 5.0–8.0)
PROTEIN: 30 mg/dL — AB
Specific Gravity, Urine: 1.027 (ref 1.005–1.030)
WBC UA: NONE SEEN WBC/hpf (ref 0–5)

## 2016-10-13 LAB — COMPREHENSIVE METABOLIC PANEL
ALBUMIN: 4.3 g/dL (ref 3.5–5.0)
ALT: 14 U/L — ABNORMAL LOW (ref 17–63)
ANION GAP: 17 — AB (ref 5–15)
AST: 19 U/L (ref 15–41)
Alkaline Phosphatase: 98 U/L (ref 38–126)
BUN: 22 mg/dL — AB (ref 6–20)
CO2: 21 mmol/L — ABNORMAL LOW (ref 22–32)
Calcium: 9.7 mg/dL (ref 8.9–10.3)
Chloride: 99 mmol/L — ABNORMAL LOW (ref 101–111)
Creatinine, Ser: 1.25 mg/dL — ABNORMAL HIGH (ref 0.61–1.24)
GFR calc Af Amer: >60 mL/min (ref >60)
GLUCOSE: 195 mg/dL — AB (ref 65–99)
POTASSIUM: 4.4 mmol/L (ref 3.5–5.1)
Sodium: 137 mmol/L (ref 135–145)
Total Bilirubin: 2.1 mg/dL — ABNORMAL HIGH (ref 0.3–1.2)
Total Protein: 7.6 g/dL (ref 6.5–8.1)

## 2016-10-13 LAB — CBC
HEMATOCRIT: 51.8 % (ref 39.0–52.0)
Hemoglobin: 18.1 g/dL — ABNORMAL HIGH (ref 13.0–17.0)
MCH: 32.1 pg (ref 26.0–34.0)
MCHC: 34.9 g/dL (ref 30.0–36.0)
MCV: 92 fL (ref 78.0–100.0)
Platelets: 243 10*3/uL (ref 150–400)
RBC: 5.63 MIL/uL (ref 4.22–5.81)
RDW: 12.6 % (ref 11.5–15.5)
WBC: 22.1 10*3/uL — AB (ref 4.0–10.5)

## 2016-10-13 LAB — CBG MONITORING, ED: Glucose-Capillary: 180 mg/dL — ABNORMAL HIGH (ref 65–99)

## 2016-10-13 LAB — LIPASE, BLOOD: Lipase: 39 U/L (ref 11–51)

## 2016-10-13 MED ORDER — ONDANSETRON 4 MG PO TBDP
4.0000 mg | ORAL_TABLET | Freq: Once | ORAL | Status: DC | PRN
Start: 2016-10-13 — End: 2016-10-13

## 2016-10-13 MED ORDER — SODIUM CHLORIDE 0.9 % IV BOLUS (SEPSIS)
1000.0000 mL | Freq: Once | INTRAVENOUS | Status: AC
  Administered 2016-10-13: 1000 mL via INTRAVENOUS

## 2016-10-13 MED ORDER — ONDANSETRON 4 MG PO TBDP
ORAL_TABLET | ORAL | Status: AC
  Filled 2016-10-13: qty 1

## 2016-10-13 MED ORDER — PROMETHAZINE HCL 25 MG RE SUPP: 25.0000 mg | Freq: Four times a day (QID) | RECTAL | 0 refills | Status: DC | PRN

## 2016-10-13 MED ORDER — ONDANSETRON HCL 4 MG/2ML IJ SOLN
4.0000 mg | Freq: Once | INTRAMUSCULAR | Status: AC
  Administered 2016-10-13: 4 mg via INTRAVENOUS
  Filled 2016-10-13: qty 2

## 2016-10-13 MED ORDER — GI COCKTAIL ~~LOC~~
30.0000 mL | Freq: Once | ORAL | Status: AC
  Administered 2016-10-13: 30 mL via ORAL
  Filled 2016-10-13: qty 30

## 2016-10-13 NOTE — ED Notes (Signed)
Pt tolerating PO fluids fine. Showing NAD.

## 2016-10-13 NOTE — ED Provider Notes (Signed)
Patient received a second liter fluid is now tolerating by mouth's.  He will begin a prescription for Phenergan suppositories to control any further episodes of nausea at home and he is to follow-up with his primary care doctor   Earley FavorSchulz, Lashona Schaaf, NP 10/13/16 2340    Mesner, Barbara CowerJason, MD 10/16/16 1719

## 2016-10-13 NOTE — Discharge Instructions (Signed)
Today you were seen for nausea and vomiting.  He received IV fluids and IV Zofran with resolution of her symptoms.  Your drinking fluids and feeling better at time of discharge.  You've been given a prescription for Phenergan suppositories that she can use if you can become nauseated.  Follow-up with your primary care doctor as needed

## 2016-10-13 NOTE — ED Triage Notes (Signed)
Pt states that I can "read his notes from yesterday." pt was here for n/v, fatigue and blood in stools and emesis. Pt went to pcp today and referred back here for dehydration. Pt is lying in floor in triage room. Reports nor relief with phenergan at pcp.

## 2016-10-13 NOTE — ED Provider Notes (Signed)
[facility] Provider Note   CSN: [id number] Arrival date & time: 10/13/16  1411     History   Chief Complaint Chief Complaint  Patient presents with  . Emesis    HPI [name] [name] is a 46 y.o. male.  HPI   46 year old male with history of hypertension, type 2 diabetes presents today with complaints of vomiting. Patient notes that over the last several days he's had persistent vomiting. He notes this is similar to previous episodes that required hospital admission. Patient reports associated abdominal discomfort in the lower abdomen, reports this is similar to previous. Patient denies any recent fever, describes a burning sensation in his throat after the vomiting. He denies any recent drugs or alcohol. Patient reports use followed by gastroenterology where he received colonoscopy and endoscopy ( Dr. Dulce Sellar) with no significant findings. Patient was seen by his primary care provider today and administered antinausea medication which did not improve his symptoms and was referred to the emergency room for ongoing management. At time of evaluation patient reports continued persistent vomiting and fatigue.    Past Medical History:  Diagnosis Date  . Atypical chest pain   . Diabetes mellitus without complication (HCC)   . Hyperlipemia   . Hypertension   . Malaise and fatigue     Patient Active Problem List   Diagnosis Date Noted  . Dehydration   . Leukocytosis 11/25/2015  . Intractable nausea and vomiting 11/25/2015  . Volume depletion 11/25/2015  . Hyperglycemia 11/25/2015  . Dental abscess 11/25/2015  . Nausea and vomiting 11/25/2015  . Angina pectoris (HCC) 03/27/2015    Past Surgical History:  Procedure Laterality Date  . ANKLE SURGERY  1982  . APPENDECTOMY  1975  . CARDIAC CATHETERIZATION N/A 03/28/2015   Procedure: Left Heart Cath and Coronary Angiography;  Surgeon: Yates Decamp, MD;  Location: [facility] INVASIVE CV LAB;  Service: Cardiovascular;  Laterality: N/A;  .  CARDIAC CATHETERIZATION N/A 03/28/2015   Procedure: Intravascular Pressure Wire/FFR Study;  Surgeon: Yates Decamp, MD;  Location: [facility]AS[facility] CV LAB;  Service: Cardiovascular;  Laterality: N/A;     Home Medications    Prior to Admission medications   Medication Sig Start Date End Date Taking? Authorizing Provider  acetaminophen (TYLENOL) 500 MG tablet Take 500 mg by mouth every 6 (six) hours as needed for mild pain or moderate pain.     [provider]  ALPRAZolam (XANAX) 0.5 MG tablet TAKE 1/2 TO 1 TABLET BY MOUTH DAILY AT BEDTIME *MAY TAKE 3 TIMES DAILY AS NEEDED FOR ANXIETY 11/06/15   [provider]  amoxicillin-clavulanate (AUGMENTIN) 875-125 MG tablet Take 1 tablet by mouth every 12 (twelve) hours. 10 day course starting on 11/13/2015 11/13/15   [provider]  aspirin 81 MG chewable tablet Chew by mouth daily.    [provider]  atorvastatin (LIPITOR) 40 MG tablet Take 40 mg by mouth daily.  07/29/14   [provider]  benzonatate (TESSALON) 100 MG capsule Take 1 capsule (100 mg total) by mouth every 8 (eight) hours. 11/23/15   Sidney Ace, MD  dicyclomine (BENTYL) 20 MG tablet Take 1 tablet (20 mg total) by mouth 2 (two) times daily. 11/20/15   Gilda Crease, MD  Empagliflozin-Linagliptin (GLYXAMBI) 10-5 MG TABS Take 1 tablet by mouth every evening.    [provider]  Fenofibric Acid 105 MG TABS Take 1 tablet by mouth daily.     [provider]  glimepiride (AMARYL) 4 MG tablet Take  4 mg by mouth daily with breakfast.    [provider]  Insulin Degludec (TRESIBA FLEXTOUCH) 200 UNIT/ML SOPN Inject 18 Units into the skin at bedtime.     [provider]  metFORMIN (GLUCOPHAGE) 500 MG tablet Take 1 tablet (500 mg total) by mouth 2 (two) times daily with a meal. 12/01/15   Richarda Overlie, MD  metoCLOPramide (REGLAN) 10 MG tablet Take 1 tablet (10 mg total) by mouth every 6 (six) hours. 11/28/15   Richarda Overlie, MD  metoprolol succinate (TOPROL-XL) 50 MG 24 hr tablet Take 50 mg by mouth daily. 10/28/15   [provider]  nitroGLYCERIN (NITROSTAT) 0.4 MG SL tablet Place 0.4 mg under the tongue every 5 (five) minutes as needed for chest pain.    [provider]  ondansetron (ZOFRAN ODT) 4 MG disintegrating tablet Take 1 tablet (4 mg total) by mouth every 8 (eight) hours as needed for nausea or vomiting. 11/23/15   Ruch, Gordy Councilman, MD  ondansetron (ZOFRAN) 4 MG tablet Take 1 tablet (4 mg total) by mouth every 6 (six) hours. 10/12/16   Raeford Razor, MD  pantoprazole (PROTONIX) 40 MG tablet Take 1 tablet (40 mg total) by mouth daily. 11/28/15   Richarda Overlie, MD  promethazine (PHENERGAN) 25 MG tablet Take 1 tablet (25 mg total) by mouth every 6 (six) hours as needed for nausea or vomiting. 11/18/15   Rancour, Jeannett Senior, MD  sertraline (ZOLOFT) 50 MG tablet Take 100 mg by mouth at bedtime.     [provider]  sucralfate (CARAFATE) 1 GM/10ML suspension Take 10 mLs (1 g total) by mouth 4 (four) times daily -  with meals and at bedtime. 11/28/15   Richarda Overlie, MD  valsartan (DIOVAN) 80 MG tablet Take 80 mg by mouth daily.    [provider]  varenicline (CHANTIX) 1 MG tablet Take 1 mg by mouth 2 (two) times daily.    [provider]    Family History Family History  Problem Relation Age of Onset  . Congestive Heart Failure Sister   . Diabetes Mother   . Hypertension Mother   . Diabetes Father   . Hypertension Father     Social History Social History  Substance Use Topics  . Smoking status: Current Every Day Smoker    Packs/day: 1.00    Years: 29.00    Types: Cigarettes  . Smokeless tobacco: Not on file  . Alcohol use No     Allergies   Omeprazole magnesium   Review of Systems Review of Systems  All other systems reviewed and are negative.  Physical Exam Updated Vital Signs BP (!) 169/100   Pulse (!) 105   Temp 98.5 F (36.9 C)  (Oral)   Resp (!) 35   Ht 5\' 8"  (1.727 m)   Wt 77.1 kg   SpO2 94%   BMI 25.85 kg/m   Physical Exam  Constitutional: He is oriented to person, place, and time. He appears well-developed and well-nourished.  HENT:  Head: Normocephalic and atraumatic.  Eyes: Conjunctivae are normal. Pupils are equal, round, and reactive to light. Right eye exhibits no discharge. Left eye exhibits no discharge. No scleral icterus.  Neck: Normal range of motion. No JVD present. No tracheal deviation present.  Pulmonary/Chest: Effort normal. No stridor.  Abdominal: Soft.  Minor lower abd TTP, non focal  Neurological: He is alert and oriented to person, place, and time. Coordination normal.  Psychiatric: He has a normal mood and affect. His  behavior is normal. Judgment and thought content normal.  Nursing note and vitals reviewed.   ED Treatments / Results  Labs (all labs ordered are listed, but only abnormal results are displayed) Labs Reviewed  COMPREHENSIVE METABOLIC PANEL - Abnormal; Notable for the following:       Result Value   Chloride 99 (*)    CO2 21 (*)    Glucose, Bld 195 (*)    BUN 22 (*)    Creatinine, Ser 1.25 (*)    ALT 14 (*)    Total Bilirubin 2.1 (*)    Anion gap 17 (*)    All other components within normal limits  CBC - Abnormal; Notable for the following:    WBC 22.1 (*)    Hemoglobin 18.1 (*)    All other components within normal limits  URINALYSIS, ROUTINE W REFLEX MICROSCOPIC - Abnormal; Notable for the following:    Color, Urine STRAW (*)    Glucose, UA >=500 (*)    Ketones, ur 80 (*)    Protein, ur 30 (*)    Squamous Epithelial / LPF 0-5 (*)    All other components within normal limits  CBG MONITORING, ED - Abnormal; Notable for the following:    Glucose-Capillary 180 (*)    All other components within normal limits  LIPASE, BLOOD    EKG  EKG Interpretation None      Radiology No results found.  Procedures Procedures (including critical care  time)  Medications Ordered in ED Medications  ondansetron (ZOFRAN) injection 4 mg (4 mg Intravenous Given 10/13/16 1827)  sodium chloride 0.9 % bolus 1,000 mL (0 mLs Intravenous Stopped 10/13/16 1907)  gi cocktail (Maalox,Lidocaine,Donnatal) (30 mLs Oral Given 10/13/16 1944)  sodium chloride 0.9 % bolus 1,000 mL (1,000 mLs Intravenous New Bag/Given 10/13/16 1946)     Initial Impression / Assessment and Plan / ED Course  I have reviewed the triage vital signs and the nursing notes.  Pertinent labs & imaging results that were available during my care of the patient were reviewed by me and considered in my medical decision making (see chart for details).      Final Clinical Impressions(s) / ED Diagnoses   Final diagnoses:  Nausea and vomiting, intractability of vomiting not specified, unspecified vomiting type   Labs: UA, Lipase, CMP and CBC, point care CBG  Imaging:  Consults:  Therapeutics: Normal saline, Zofran, GI cocktail  Discharge Meds:   Assessment/Plan: 46 year old male presents today with nausea vomiting. Patient has a history of the same, has been followed by gastrology for this. Patient profusely vomiting in front of me. Patient has very minor abdominal discomfort did he reports is similar to previous episodes. He is afebrile, passing gas, low suspicion for obstruction low suspicion for acute intra-abdominal pathology that would require imaging studies. Patient had anti-medics prior to arrival, he was given Zofran through the IV, normal saline. Patient will be by mouth challenged. He is requesting GI cocktail as if that improves his symptoms.  Patient labs have not significantly changed from yesterday. He has improvement in his anion gap from 19-17, white count a 22.1 compared to 20.5 previously, creatinine slightly elevated at 1.25 from 1.15. I anticipate that if patient continues to tolerate by mouth he would be safe for outpatient management, if he continues to vomit he  will likely need hospitalization for ongoing fluid losses. Patient care of be assigned to oncoming fried or pending by mouth challenge, fluid administration and reassessment.   New Prescriptions  New Prescriptions   No medications on file     Rosalio LoudHedges, Mahaley Schwering, PA-C 10/13/16 2030    Mesner, Barbara CowerJason, MD 10/16/16 1719

## 2016-10-13 NOTE — ED Notes (Signed)
At triage, pt lying in floor. Refused to leave the triage room without ivf and md eval. Informed pt that this is not a treatment room and that we will work on a room asap for him to be evaluated. Pt refused all bloodwork at triage and refused zofran.

## 2016-10-15 ENCOUNTER — Inpatient Hospital Stay (HOSPITAL_COMMUNITY)
Admission: EM | Admit: 2016-10-15 | Discharge: 2016-10-17 | DRG: 638 | Disposition: A | Discharge status: Home / Self Care | Payer: 59 | Attending: Nephrology | Admitting: Nephrology

## 2016-10-15 ENCOUNTER — Encounter (HOSPITAL_COMMUNITY): Payer: Self-pay

## 2016-10-15 ENCOUNTER — Emergency Department (HOSPITAL_COMMUNITY): Payer: 59

## 2016-10-15 DIAGNOSIS — Z794 Long term (current) use of insulin: Secondary | ICD-10-CM | POA: Diagnosis not present

## 2016-10-15 DIAGNOSIS — R000 Tachycardia, unspecified: Secondary | ICD-10-CM | POA: Diagnosis present

## 2016-10-15 DIAGNOSIS — F418 Other specified anxiety disorders: Secondary | ICD-10-CM | POA: Diagnosis present

## 2016-10-15 DIAGNOSIS — I10 Essential (primary) hypertension: Secondary | ICD-10-CM

## 2016-10-15 DIAGNOSIS — F419 Anxiety disorder, unspecified: Secondary | ICD-10-CM

## 2016-10-15 DIAGNOSIS — K219 Gastro-esophageal reflux disease without esophagitis: Secondary | ICD-10-CM

## 2016-10-15 DIAGNOSIS — F17210 Nicotine dependence, cigarettes, uncomplicated: Secondary | ICD-10-CM | POA: Diagnosis present

## 2016-10-15 DIAGNOSIS — Z79899 Other long term (current) drug therapy: Secondary | ICD-10-CM

## 2016-10-15 DIAGNOSIS — E785 Hyperlipidemia, unspecified: Secondary | ICD-10-CM | POA: Diagnosis present

## 2016-10-15 DIAGNOSIS — E1110 Type 2 diabetes mellitus with ketoacidosis without coma: Principal | ICD-10-CM | POA: Diagnosis present

## 2016-10-15 DIAGNOSIS — R1115 Cyclical vomiting syndrome unrelated to migraine: Secondary | ICD-10-CM

## 2016-10-15 DIAGNOSIS — Z7982 Long term (current) use of aspirin: Secondary | ICD-10-CM | POA: Diagnosis not present

## 2016-10-15 DIAGNOSIS — R112 Nausea with vomiting, unspecified: Secondary | ICD-10-CM | POA: Diagnosis present

## 2016-10-15 DIAGNOSIS — Z888 Allergy status to other drugs, medicaments and biological substances status: Secondary | ICD-10-CM | POA: Diagnosis not present

## 2016-10-15 DIAGNOSIS — E1010 Type 1 diabetes mellitus with ketoacidosis without coma: Secondary | ICD-10-CM | POA: Diagnosis present

## 2016-10-15 DIAGNOSIS — E871 Hypo-osmolality and hyponatremia: Secondary | ICD-10-CM | POA: Diagnosis present

## 2016-10-15 LAB — GLUCOSE, CAPILLARY
GLUCOSE-CAPILLARY: 153 mg/dL — AB (ref 65–99)
GLUCOSE-CAPILLARY: 166 mg/dL — AB (ref 65–99)
GLUCOSE-CAPILLARY: 168 mg/dL — AB (ref 65–99)
GLUCOSE-CAPILLARY: 169 mg/dL — AB (ref 65–99)
GLUCOSE-CAPILLARY: 175 mg/dL — AB (ref 65–99)

## 2016-10-15 LAB — URINALYSIS, ROUTINE W REFLEX MICROSCOPIC
BILIRUBIN URINE: NEGATIVE
Bacteria, UA: NONE SEEN
HGB URINE DIPSTICK: NEGATIVE
KETONES UR: 80 mg/dL — AB
LEUKOCYTES UA: NEGATIVE
Nitrite: NEGATIVE
PROTEIN: 30 mg/dL — AB
Specific Gravity, Urine: 1.026 (ref 1.005–1.030)
Squamous Epithelial / LPF: NONE SEEN
pH: 5 (ref 5.0–8.0)

## 2016-10-15 LAB — COMPREHENSIVE METABOLIC PANEL
ALK PHOS: 85 U/L (ref 38–126)
ALT: 13 U/L — AB (ref 17–63)
ANION GAP: 24 — AB (ref 5–15)
AST: 15 U/L (ref 15–41)
Albumin: 3.9 g/dL (ref 3.5–5.0)
BUN: 23 mg/dL — ABNORMAL HIGH (ref 6–20)
CALCIUM: 9.5 mg/dL (ref 8.9–10.3)
CO2: 15 mmol/L — AB (ref 22–32)
CREATININE: 1.53 mg/dL — AB (ref 0.61–1.24)
Chloride: 96 mmol/L — ABNORMAL LOW (ref 101–111)
GFR calc non Af Amer: 53 mL/min — ABNORMAL LOW (ref >60)
Glucose, Bld: 245 mg/dL — ABNORMAL HIGH (ref 65–99)
Potassium: 4.4 mmol/L (ref 3.5–5.1)
SODIUM: 135 mmol/L (ref 135–145)
Total Bilirubin: 2.3 mg/dL — ABNORMAL HIGH (ref 0.3–1.2)
Total Protein: 7.2 g/dL (ref 6.5–8.1)

## 2016-10-15 LAB — MRSA PCR SCREENING: MRSA by PCR: NEGATIVE

## 2016-10-15 LAB — BASIC METABOLIC PANEL
Anion gap: 14 (ref 5–15)
Anion gap: 14 (ref 5–15)
BUN: 22 mg/dL — ABNORMAL HIGH (ref 6–20)
BUN: 21 mg/dL — ABNORMAL HIGH (ref 6–20)
CALCIUM: 8.2 mg/dL — AB (ref 8.9–10.3)
CALCIUM: 8.3 mg/dL — AB (ref 8.9–10.3)
CO2: 16 mmol/L — ABNORMAL LOW (ref 22–32)
CO2: 18 mmol/L — ABNORMAL LOW (ref 22–32)
CREATININE: 1.29 mg/dL — AB (ref 0.61–1.24)
Chloride: 103 mmol/L (ref 101–111)
Chloride: 103 mmol/L (ref 101–111)
Creatinine, Ser: 1.34 mg/dL — ABNORMAL HIGH (ref 0.61–1.24)
GFR calc Af Amer: >60 mL/min (ref >60)
GFR calc Af Amer: >60 mL/min (ref >60)
GLUCOSE: 170 mg/dL — AB (ref 65–99)
Glucose, Bld: 163 mg/dL — ABNORMAL HIGH (ref 65–99)
Potassium: 4.9 mmol/L (ref 3.5–5.1)
Potassium: 4.7 mmol/L (ref 3.5–5.1)
SODIUM: 135 mmol/L (ref 135–145)
Sodium: 133 mmol/L — ABNORMAL LOW (ref 135–145)

## 2016-10-15 LAB — CBC
HCT: 52.7 % — ABNORMAL HIGH (ref 39.0–52.0)
Hemoglobin: 18.4 g/dL — ABNORMAL HIGH (ref 13.0–17.0)
MCH: 32.1 pg (ref 26.0–34.0)
MCHC: 34.9 g/dL (ref 30.0–36.0)
MCV: 91.8 fL (ref 78.0–100.0)
Platelets: 238 10*3/uL (ref 150–400)
RBC: 5.74 MIL/uL (ref 4.22–5.81)
RDW: 12.4 % (ref 11.5–15.5)
WBC: 19.3 10*3/uL — AB (ref 4.0–10.5)

## 2016-10-15 LAB — CBG MONITORING, ED: Glucose-Capillary: 189 mg/dL — ABNORMAL HIGH (ref 65–99)

## 2016-10-15 LAB — LIPASE, BLOOD: LIPASE: 15 U/L (ref 11–51)

## 2016-10-15 LAB — MAGNESIUM: Magnesium: 2.6 mg/dL — ABNORMAL HIGH (ref 1.7–2.4)

## 2016-10-15 MED ORDER — ATORVASTATIN CALCIUM 40 MG PO TABS
40.0000 mg | ORAL_TABLET | Freq: Every day | ORAL | Status: DC
Start: 2016-10-16 — End: 2016-10-17
  Administered 2016-10-16 – 2016-10-17 (×2): 40 mg via ORAL
  Filled 2016-10-15 (×2): qty 1

## 2016-10-15 MED ORDER — FENOFIBRIC ACID 105 MG PO TABS: 1.0000 | ORAL_TABLET | Freq: Every day | ORAL | Status: DC

## 2016-10-15 MED ORDER — IRBESARTAN 75 MG PO TABS
75.0000 mg | ORAL_TABLET | Freq: Every day | ORAL | Status: DC
  Administered 2016-10-16 – 2016-10-17 (×2): 75 mg via ORAL
  Filled 2016-10-15 (×2): qty 1

## 2016-10-15 MED ORDER — METOPROLOL TARTRATE 5 MG/5ML IV SOLN
2.5000 mg | Freq: Once | INTRAVENOUS | Status: AC
  Administered 2016-10-15: 2.5 mg via INTRAVENOUS
  Filled 2016-10-15: qty 5

## 2016-10-15 MED ORDER — LORAZEPAM 2 MG/ML IJ SOLN: 1.0000 mg | Freq: Once | INTRAMUSCULAR | Status: DC

## 2016-10-15 MED ORDER — SODIUM CHLORIDE 0.9 % IV SOLN
INTRAVENOUS | Status: DC
  Administered 2016-10-15: 1.3 [IU]/h via INTRAVENOUS
  Filled 2016-10-15: qty 1

## 2016-10-15 MED ORDER — INSULIN ASPART 100 UNIT/ML ~~LOC~~ SOLN
0.0000 [IU] | Freq: Three times a day (TID) | SUBCUTANEOUS | Status: DC
  Administered 2016-10-16 (×2): 3 [IU] via SUBCUTANEOUS

## 2016-10-15 MED ORDER — CEFUROXIME AXETIL 500 MG PO TABS: 500.0000 mg | ORAL_TABLET | Freq: Two times a day (BID) | ORAL | Status: DC

## 2016-10-15 MED ORDER — POTASSIUM CHLORIDE 10 MEQ/100ML IV SOLN
10.0000 meq | INTRAVENOUS | Status: AC
  Administered 2016-10-15: 10 meq via INTRAVENOUS
  Filled 2016-10-15: qty 100

## 2016-10-15 MED ORDER — EMPAGLIFLOZIN-LINAGLIPTIN 10-5 MG PO TABS: 1.0000 | ORAL_TABLET | Freq: Every evening | ORAL | Status: DC

## 2016-10-15 MED ORDER — ENOXAPARIN SODIUM 40 MG/0.4ML ~~LOC~~ SOLN
40.0000 mg | SUBCUTANEOUS | Status: DC
  Administered 2016-10-15 – 2016-10-16 (×2): 40 mg via SUBCUTANEOUS
  Filled 2016-10-15 (×2): qty 0.4

## 2016-10-15 MED ORDER — NITROGLYCERIN 0.4 MG SL SUBL: 0.4000 mg | SUBLINGUAL_TABLET | SUBLINGUAL | Status: DC | PRN

## 2016-10-15 MED ORDER — METOCLOPRAMIDE HCL 10 MG PO TABS
10.0000 mg | ORAL_TABLET | Freq: Four times a day (QID) | ORAL | Status: DC
  Administered 2016-10-15 – 2016-10-17 (×8): 10 mg via ORAL
  Filled 2016-10-15 (×8): qty 1

## 2016-10-15 MED ORDER — HALOPERIDOL LACTATE 5 MG/ML IJ SOLN
2.0000 mg | Freq: Once | INTRAMUSCULAR | Status: AC
  Administered 2016-10-15: 2 mg via INTRAVENOUS
  Filled 2016-10-15: qty 1

## 2016-10-15 MED ORDER — SODIUM CHLORIDE 0.9 % IV SOLN
INTRAVENOUS | Status: DC
  Filled 2016-10-15: qty 1

## 2016-10-15 MED ORDER — GI COCKTAIL ~~LOC~~
30.0000 mL | Freq: Once | ORAL | Status: AC
  Administered 2016-10-15: 30 mL via ORAL
  Filled 2016-10-15: qty 30

## 2016-10-15 MED ORDER — ONDANSETRON HCL 4 MG/2ML IJ SOLN
4.0000 mg | Freq: Once | INTRAMUSCULAR | Status: AC | PRN
  Administered 2016-10-15: 4 mg via INTRAVENOUS
  Filled 2016-10-15: qty 2

## 2016-10-15 MED ORDER — METOPROLOL SUCCINATE ER 50 MG PO TB24
50.0000 mg | ORAL_TABLET | Freq: Every day | ORAL | Status: DC
  Administered 2016-10-15 – 2016-10-16 (×2): 50 mg via ORAL
  Filled 2016-10-15 (×2): qty 1

## 2016-10-15 MED ORDER — BUPROPION HCL ER (SR) 100 MG PO TB12
100.0000 mg | ORAL_TABLET | Freq: Two times a day (BID) | ORAL | Status: DC
  Administered 2016-10-16 – 2016-10-17 (×3): 100 mg via ORAL
  Filled 2016-10-15 (×3): qty 1

## 2016-10-15 MED ORDER — LINAGLIPTIN 5 MG PO TABS
5.0000 mg | ORAL_TABLET | Freq: Every day | ORAL | Status: DC
  Administered 2016-10-16 – 2016-10-17 (×2): 5 mg via ORAL
  Filled 2016-10-15 (×2): qty 1

## 2016-10-15 MED ORDER — LIDOCAINE VISCOUS 2 % MT SOLN
15.0000 mL | Freq: Once | OROMUCOSAL | Status: AC
  Administered 2016-10-15: 15 mL via OROMUCOSAL
  Filled 2016-10-15: qty 15

## 2016-10-15 MED ORDER — SODIUM CHLORIDE 0.9 % IV BOLUS (SEPSIS)
1000.0000 mL | Freq: Once | INTRAVENOUS | Status: AC
  Administered 2016-10-15: 1000 mL via INTRAVENOUS

## 2016-10-15 MED ORDER — DEXTROSE-NACL 5-0.45 % IV SOLN
INTRAVENOUS | Status: DC
  Administered 2016-10-15 – 2016-10-16 (×2): via INTRAVENOUS

## 2016-10-15 MED ORDER — INSULIN DEGLUDEC 200 UNIT/ML ~~LOC~~ SOPN: 24.0000 [IU] | PEN_INJECTOR | Freq: Every day | SUBCUTANEOUS | Status: DC

## 2016-10-15 MED ORDER — ALPRAZOLAM 0.5 MG PO TABS: 0.5000 mg | ORAL_TABLET | Freq: Three times a day (TID) | ORAL | Status: DC | PRN

## 2016-10-15 MED ORDER — INSULIN GLARGINE 100 UNIT/ML ~~LOC~~ SOLN
10.0000 [IU] | Freq: Every day | SUBCUTANEOUS | Status: DC
  Administered 2016-10-15 – 2016-10-16 (×2): 10 [IU] via SUBCUTANEOUS
  Filled 2016-10-15 (×2): qty 0.1

## 2016-10-15 MED ORDER — INSULIN ASPART 100 UNIT/ML ~~LOC~~ SOLN: 0.0000 [IU] | Freq: Every day | SUBCUTANEOUS | Status: DC

## 2016-10-15 MED ORDER — SODIUM CHLORIDE 0.9 % IV BOLUS (SEPSIS)
1000.0000 mL | Freq: Once | INTRAVENOUS | Status: AC
Start: 2016-10-15 — End: 2016-10-15
  Administered 2016-10-15: 1000 mL via INTRAVENOUS

## 2016-10-15 MED ORDER — SODIUM CHLORIDE 0.9 % IV SOLN
INTRAVENOUS | Status: DC
  Administered 2016-10-15: 16:00:00 via INTRAVENOUS

## 2016-10-15 MED ORDER — ASPIRIN 81 MG PO CHEW
81.0000 mg | CHEWABLE_TABLET | Freq: Every day | ORAL | Status: DC
  Administered 2016-10-15 – 2016-10-16 (×2): 81 mg via ORAL
  Filled 2016-10-15 (×2): qty 1

## 2016-10-15 MED ORDER — PANTOPRAZOLE SODIUM 40 MG PO TBEC
40.0000 mg | DELAYED_RELEASE_TABLET | Freq: Every day | ORAL | Status: DC
  Administered 2016-10-16 – 2016-10-17 (×2): 40 mg via ORAL
  Filled 2016-10-15 (×2): qty 1

## 2016-10-15 MED ORDER — DEXTROSE-NACL 5-0.45 % IV SOLN: INTRAVENOUS | Status: DC

## 2016-10-15 NOTE — H&P (Signed)
History and Physical    [name]:[id number] DOB: [birth year] DOA: 10/15/2016  PCP: Lindaann Pascal, PA-C (Confirm with patient/family/NH records and if not entered, this has to be entered at [facility] point of entry) Patient coming from: home  I have personally briefly reviewed patient's old medical records in [facility]  Chief Complaint: Nausea and vomiting for one week which is intractable.   HPI: [name] [name] is a 46 year old diabetic was diagnosed 2 years ago with DKA as his presenting symptomatology. He presented to the emergency department with the complaint of intractable nausea and vomiting. In the emergency department he was fully evaluated given several liters of IV fluid but symptoms persisted. Blood work revealed an anion gap of 26. He has been in the emergency department twice previously in recent days but his anion gap was not quite this elevated until today. Patient does complain of some intermittent abdominal pain which is spasmodic and worsens with vomiting. He has had some burning of his throat with vomiting as well. Abdominal pain is diffuse and sharp in nature. Nothing makes it better and vomiting makes it worse.   ED Course: Patient given 2 L of IV fluids given elevation in anion gap and insulin drip was begun. Patient referred to Hospital medicine for further evaluation and management of diabetic ketoacidosis.  Review of Systems: As per HPI otherwise 10 point review of systems negative.   Review of Systems  Constitutional: Negative for chills and fever.  HENT: Negative for congestion, nosebleeds and sinus pain.   Eyes: Negative for blurred vision, double vision and pain.  Respiratory: Negative for cough, hemoptysis and sputum production.   Cardiovascular: Negative for chest pain, palpitations and orthopnea.  Gastrointestinal: Positive for abdominal pain, heartburn, nausea and vomiting. Negative for constipation and diarrhea.  Genitourinary: Negative for  dysuria, frequency and urgency.  Musculoskeletal: Negative for back pain, myalgias and neck pain.  Skin: Negative for itching and rash.  Neurological: Positive for weakness and headaches. Negative for dizziness, tingling and tremors.  Endo/Heme/Allergies: Negative for environmental allergies. Does not bruise/bleed easily.  Psychiatric/Behavioral: Negative for depression, substance abuse and suicidal ideas.   Past Medical History:  Diagnosis Date  . Atypical chest pain   . Diabetes mellitus without complication (HCC)   . Hyperlipemia   . Hypertension   . Malaise and fatigue     Past Surgical History:  Procedure Laterality Date  . ANKLE SURGERY  1982  . APPENDECTOMY  1975  . CARDIAC CATHETERIZATION N/A 03/28/2015   Procedure: Left Heart Cath and Coronary Angiography;  Surgeon: Yates Decamp, MD;  Location: [facility] INVASIVE CV LAB;  Service: Cardiovascular;  Laterality: N/A;  . CARDIAC CATHETERIZATION N/A 03/28/2015   Procedure: Intravascular Pressure Wire/FFR Study;  Surgeon: Yates Decamp, MD;  Location: [facility] INVASIVE CV LAB;  Service: Cardiovascular;  Laterality: N/A;   Social history:  reports that he has been smoking Cigarettes.  He has a 29.00 pack-year smoking history. He has never used smokeless tobacco. He reports that he does not drink alcohol or use drugs.  Allergies  Allergen Reactions  . Omeprazole Magnesium Swelling  . Esomeprazole Other (See Comments)    Facial swelling    Family History  Problem Relation Age of Onset  . Congestive Heart Failure Sister   . Diabetes Mother   . Hypertension Mother   . Diabetes Father   . Hypertension Father      Prior to Admission medications   Medication Sig Start Date End Date Taking? Authorizing  Provider  ALPRAZolam (XANAX) 0.5 MG tablet TAKE 1/2 TO 1 TABLET BY MOUTH DAILY AT BEDTIME *MAY TAKE 3 TIMES DAILY AS NEEDED FOR ANXIETY 11/06/15  Yes [provider]  aspirin 81 MG chewable tablet Chew 81 mg by mouth at bedtime.    Yes  [provider]  atorvastatin (LIPITOR) 40 MG tablet Take 40 mg by mouth daily.  07/29/14  Yes [provider]  buPROPion (WELLBUTRIN SR) 100 MG 12 hr tablet Take 100 mg by mouth 2 (two) times daily.   Yes [provider]  cefdinir (OMNICEF) 300 MG capsule Take 600 mg by mouth daily.   Yes [provider]  dexlansoprazole (DEXILANT) 60 MG capsule Take 60 mg by mouth daily.   Yes [provider]  Empagliflozin-Linagliptin (GLYXAMBI) 10-5 MG TABS Take 1 tablet by mouth every evening.   Yes [provider]  Fenofibric Acid 105 MG TABS Take 1 tablet by mouth daily.    Yes [provider]  glimepiride (AMARYL) 4 MG tablet Take 4 mg by mouth daily with breakfast.   Yes [provider]  Insulin Degludec (TRESIBA FLEXTOUCH) 200 UNIT/ML SOPN Inject 24 Units into the skin at bedtime.    Yes [provider]  metFORMIN (GLUCOPHAGE) 500 MG tablet Take 1 tablet (500 mg total) by mouth 2 (two) times daily with a meal. 12/01/15  Yes Richarda Overlie, MD  metoCLOPramide (REGLAN) 10 MG tablet Take 1 tablet (10 mg total) by mouth every 6 (six) hours. 11/28/15  Yes Richarda Overlie, MD  metoprolol succinate (TOPROL-XL) 50 MG 24 hr tablet Take 50 mg by mouth at bedtime.  10/28/15  Yes [provider]  promethazine (PHENERGAN) 25 MG suppository Place 1 suppository (25 mg total) rectally every 6 (six) hours as needed for nausea or vomiting. 10/13/16  Yes Earley Favor, NP  valsartan (DIOVAN) 160 MG tablet Take 160 mg by mouth daily.    Yes [provider]  nitroGLYCERIN (NITROSTAT) 0.4 MG SL tablet Place 0.4 mg under the tongue every 5 (five) minutes as needed for chest pain.    [provider]  ondansetron (ZOFRAN ODT) 4 MG disintegrating tablet Take 1 tablet (4 mg total) by mouth every 8 (eight) hours as needed for nausea or vomiting. Patient not taking: Reported on 10/15/2016 11/23/15   Sidney Ace, MD  promethazine  (PHENERGAN) 25 MG tablet Take 1 tablet (25 mg total) by mouth every 6 (six) hours as needed for nausea or vomiting. Patient not taking: Reported on 10/15/2016 11/18/15   Glynn Octave, MD    Physical Exam: Vitals:   10/15/16 1300 10/15/16 1330 10/15/16 1403 10/15/16 1500  BP: (!) 154/92 127/73 99/70 111/81  Pulse: (!) 101 93 99 98  Resp: (!) 24 19 (!) 22 (!) 25  Temp:      TempSrc:      SpO2: 97% 100% 99% 95%  Weight:      Height:        Constitutional: NAD, calm, comfortable Vitals:   10/15/16 1300 10/15/16 1330 10/15/16 1403 10/15/16 1500  BP: (!) 154/92 127/73 99/70 111/81  Pulse: (!) 101 93 99 98  Resp: (!) 24 19 (!) 22 (!) 25  Temp:      TempSrc:      SpO2: 97% 100% 99% 95%  Weight:      Height:       Eyes: PERRL, lids and conjunctivae normal ENMT: Mucous membranes are Dry. Posterior pharynx clear of any exudate or lesions.Normal  dentition.  Neck: normal, supple, no masses, no thyromegaly Respiratory: clear to auscultation bilaterally, no wheezing, no crackles. Normal respiratory effort. No accessory muscle use.  Cardiovascular: Tachycardic, Regular rate and rhythm, no murmurs / rubs / gallops. No extremity edema. 2+ pedal pulses. No carotid bruits.  Abdomen: Slight tenderness to palpation tenderness, no masses palpated. No hepatosplenomegaly. Bowel sounds positive.  Musculoskeletal: no clubbing / cyanosis. No joint deformity upper and lower extremities. Good ROM, no contractures. Normal muscle tone.  Skin: no rashes, lesions, ulcers. No induration Neurologic: CN 2-12 grossly intact. Sensation intact, DTR normal. Strength 5/5 in all 4.  Psychiatric: Normal judgment and insight. Alert and oriented x 3. Normal mood.    Labs on Admission: I have personally reviewed following labs and imaging studies  CBC:  Recent Labs Lab 10/12/16 1116 10/13/16 1812 10/15/16 0815  WBC 20.5* 22.1* 19.3*  HGB 18.5* 18.1* 18.4*  HCT 51.7 51.8 52.7*  MCV 90.4 92.0 91.8  PLT 243  243 238   Basic Metabolic Panel:  Recent Labs Lab 10/12/16 1116 10/13/16 1812 10/15/16 0815  NA 138 137 135  K 4.5 4.4 4.4  CL 100* 99* 96*  CO2 19* 21* 15*  GLUCOSE 276* 195* 245*  BUN 18 22* 23*  CREATININE 1.15 1.25* 1.53*  CALCIUM 9.9 9.7 9.5   GFR: Estimated Creatinine Clearance: 57.7 mL/min (A) (by C-G formula based on SCr of 1.53 mg/dL (H)). Liver Function Tests:  Recent Labs Lab 10/12/16 1116 10/13/16 1812 10/15/16 0815  AST 18 19 15   ALT 16* 14* 13*  ALKPHOS 96 98 85  BILITOT 1.4* 2.1* 2.3*  PROT 7.5 7.6 7.2  ALBUMIN 4.1 4.3 3.9    Recent Labs Lab 10/12/16 1116 10/13/16 1812 10/15/16 0815  LIPASE 19 39 15   No results for input(s): AMMONIA in the last 168 hours. Coagulation Profile: No results for input(s): INR, PROTIME in the last 168 hours. Cardiac Enzymes: No results for input(s): CKTOTAL, CKMB, CKMBINDEX, TROPONINI in the last 168 hours. BNP (last 3 results) No results for input(s): PROBNP in the last 8760 hours. HbA1C: No results for input(s): HGBA1C in the last 72 hours. CBG:  Recent Labs Lab 10/13/16 1809 10/15/16 1611  GLUCAP 180* 189*   Lipid Profile: No results for input(s): CHOL, HDL, LDLCALC, TRIG, CHOLHDL, LDLDIRECT in the last 72 hours. Thyroid Function Tests: No results for input(s): TSH, T4TOTAL, FREET4, T3FREE, THYROIDAB in the last 72 hours. Anemia Panel: No results for input(s): VITAMINB12, FOLATE, FERRITIN, TIBC, IRON, RETICCTPCT in the last 72 hours. Urine analysis:    Component Value Date/Time   COLORURINE STRAW (A) 10/15/2016 1230   APPEARANCEUR CLEAR 10/15/2016 1230   LABSPEC 1.026 10/15/2016 1230   PHURINE 5.0 10/15/2016 1230   GLUCOSEU >=500 (A) 10/15/2016 1230   HGBUR NEGATIVE 10/15/2016 1230   BILIRUBINUR NEGATIVE 10/15/2016 1230   KETONESUR 80 (A) 10/15/2016 1230   PROTEINUR 30 (A) 10/15/2016 1230   NITRITE NEGATIVE 10/15/2016 1230   LEUKOCYTESUR NEGATIVE 10/15/2016 1230    Radiological Exams on  Admission: US Abdomen Complete  Result Date: 10/15/2016 CLINICAL DATA:  Patient with nausea, vomiting and abdominal pain. EXAM: ABDOMEN ULTRASOUND COMPLETE COMPARISON:  CT abdomen pelvis 11/27/2015. FINDINGS: Gallbladder: No gallstones or wall thickening visualized. No sonographic Murphy sign noted by sonographer. Common bile duct: Diameter: 4 mm Liver: Increased in echogenicity.  No focal lesion identified. IVC: No abnormality visualized. Pancreas: Visualized portion unremarkable. Spleen: Size and appearance within normal limits. Right Kidney: Length: 10.9 cm. Echogenicity within  normal limits. No mass or hydronephrosis visualized. Left Kidney: Length: 11.2 cm. Echogenicity within normal limits. No mass or hydronephrosis visualized. Abdominal aorta: No aneurysm visualized. Other findings: None. IMPRESSION: No acute process within the abdomen. Hepatic steatosis. Electronically Signed   By: Annia Beltrew  Davis M.D.   On: 10/15/2016 10:18    EKG: Independently reviewed. Sinus tachycardia with right bundle branch block and left anterior fascicular block.  Assessment/Plan Principal Problem:   DKA (diabetic ketoacidoses) (HCC) Active Problems:   Intractable nausea and vomiting   Gastroesophageal reflux disease   Anxiety with depression   Essential hypertension   1. Diabetic ketoacidosis:  Patient will be admitted to the stepdown unit on an insulin drip. Will closely monitor his DKA and anion gap. When anion gap decreases or closes will transition patient to his usual home diabetic regimen. For the time being will going to hold his metformin and his Glyxambi. Will monitor serial BMPs closely and adjust insulin drip per protocol.  2. Intractable nausea and vomiting: Patient will receive IV Zofran for management of intractable nausea and vomiting. He takes Phenergan at home but will hold this due to side effects of sleepiness and difficulty with mentation.  3 gastroesophageal reflux disease: We'll continue home  medications of proton pump inhibitor patient uses excellent at home but we will switch to pantoprazole as per hospital protocol.  4. Anxiety with depression we'll continue Xanax and Wellbutrin.  5. Essential hypertension we'll continue home metoprolol and valsartan.    DVT prophylaxis: Lovenox Code Status: Full code Family Communication: Case discussed with patient and his mother at the bedside. Disposition Plan: Likely home in 48-72 hours Consults called: None Admission status: Inpatient   Lahoma Crockerheresa C Doctor Sheahan MD FACP [facility] Pager [phone]- [phone] If 7PM-7AM, please contact night-coverage [email or web address] Password [email or web address]  10/15/2016, 5:07 PM

## 2016-10-15 NOTE — ED Notes (Signed)
Attempted to collect urine. Pt unable to provide at this time

## 2016-10-15 NOTE — ED Notes (Signed)
Pt reports he vomited after taking the lidocaine.

## 2016-10-15 NOTE — ED Notes (Signed)
Pt was unable to tolerate PO water.

## 2016-10-15 NOTE — ED Provider Notes (Addendum)
[facility]-EMERGENCY DEPT Provider Note   CSN: [id number] Arrival date & time: 10/15/16  0709     History   Chief Complaint Chief Complaint  Patient presents with  . Emesis    HPI [name] [name] is a 46 y.o. male.  HPI Patient presents with concern of nausea, vomiting, weakness. Symptoms began 3 days ago, and the patient was seen once in the interval. He notes that since that discharge he has been attempting use of suppository antiemetics, has had minimal change in his condition, with ongoing nausea, vomiting, lack of substantial oral intake, and increasing generalized weakness. No focal pain, though he does have upper abdominal discomfort. No chest pain, no dyspnea. Prior to the onset of illness patient was generally well, and states that he took all medication as directed. He is here with his mother who assists with the history of present illness.  Past Medical History:  Diagnosis Date  . Atypical chest pain   . Diabetes mellitus without complication (HCC)   . Hyperlipemia   . Hypertension   . Malaise and fatigue     Patient Active Problem List   Diagnosis Date Noted  . Dehydration   . Leukocytosis 11/25/2015  . Intractable nausea and vomiting 11/25/2015  . Volume depletion 11/25/2015  . Hyperglycemia 11/25/2015  . Dental abscess 11/25/2015  . Nausea and vomiting 11/25/2015  . Angina pectoris (HCC) 03/27/2015    Past Surgical History:  Procedure Laterality Date  . ANKLE SURGERY  1982  . APPENDECTOMY  1975  . CARDIAC CATHETERIZATION N/A 03/28/2015   Procedure: Left Heart Cath and Coronary Angiography;  Surgeon: Yates Decamp, MD;  Location: [facility] INVASIVE CV LAB;  Service: Cardiovascular;  Laterality: N/A;  . CARDIAC CATHETERIZATION N/A 03/28/2015   Procedure: Intravascular Pressure Wire/FFR Study;  Surgeon: Yates Decamp, MD;  Location: [facility] INVASIVE CV LAB;  Service: Cardiovascular;  Laterality: N/A;       Home Medications    Prior to Admission medications     Medication Sig Start Date End Date Taking? Authorizing Provider  acetaminophen (TYLENOL) 500 MG tablet Take 500 mg by mouth every 6 (six) hours as needed for mild pain or moderate pain.     [provider]  ALPRAZolam (XANAX) 0.5 MG tablet TAKE 1/2 TO 1 TABLET BY MOUTH DAILY AT BEDTIME *MAY TAKE 3 TIMES DAILY AS NEEDED FOR ANXIETY 11/06/15   [provider]  amoxicillin-clavulanate (AUGMENTIN) 875-125 MG tablet Take 1 tablet by mouth every 12 (twelve) hours. 10 day course starting on 11/13/2015 11/13/15   [provider]  aspirin 81 MG chewable tablet Chew by mouth daily.    [provider]  atorvastatin (LIPITOR) 40 MG tablet Take 40 mg by mouth daily.  07/29/14   [provider]  benzonatate (TESSALON) 100 MG capsule Take 1 capsule (100 mg total) by mouth every 8 (eight) hours. 11/23/15   Sidney Ace, MD  dicyclomine (BENTYL) 20 MG tablet Take 1 tablet (20 mg total) by mouth 2 (two) times daily. 11/20/15   Gilda Crease, MD  Empagliflozin-Linagliptin (GLYXAMBI) 10-5 MG TABS Take 1 tablet by mouth every evening.    [provider]  Fenofibric Acid 105 MG TABS Take 1 tablet by mouth daily.     [provider]  glimepiride (AMARYL) 4 MG tablet Take 4 mg by mouth daily with breakfast.    [provider]  Insulin Degludec (TRESIBA FLEXTOUCH) 200 UNIT/ML SOPN Inject 18 Units into the skin at bedtime.  [provider]  metFORMIN (GLUCOPHAGE) 500 MG tablet Take 1 tablet (500 mg total) by mouth 2 (two) times daily with a meal. 12/01/15   Richarda OverlieAbrol, Nayana, MD  metoCLOPramide (REGLAN) 10 MG tablet Take 1 tablet (10 mg total) by mouth every 6 (six) hours. 11/28/15   Richarda OverlieAbrol, Nayana, MD  metoprolol succinate (TOPROL-XL) 50 MG 24 hr tablet Take 50 mg by mouth daily. 10/28/15   [provider]  nitroGLYCERIN (NITROSTAT) 0.4 MG SL tablet Place 0.4 mg under the tongue every 5 (five) minutes as needed for chest pain.     [provider]  ondansetron (ZOFRAN ODT) 4 MG disintegrating tablet Take 1 tablet (4 mg total) by mouth every 8 (eight) hours as needed for nausea or vomiting. 11/23/15   Ruch, Gordy CouncilmanAlison Charruf, MD  ondansetron (ZOFRAN) 4 MG tablet Take 1 tablet (4 mg total) by mouth every 6 (six) hours. 10/12/16   Raeford RazorKohut, Stephen, MD  pantoprazole (PROTONIX) 40 MG tablet Take 1 tablet (40 mg total) by mouth daily. 11/28/15   Richarda OverlieAbrol, Nayana, MD  promethazine (PHENERGAN) 25 MG suppository Place 1 suppository (25 mg total) rectally every 6 (six) hours as needed for nausea or vomiting. 10/13/16   Earley FavorSchulz, Gail, NP  promethazine (PHENERGAN) 25 MG tablet Take 1 tablet (25 mg total) by mouth every 6 (six) hours as needed for nausea or vomiting. 11/18/15   Rancour, Jeannett SeniorStephen, MD  sertraline (ZOLOFT) 50 MG tablet Take 100 mg by mouth at bedtime.     [provider]  sucralfate (CARAFATE) 1 GM/10ML suspension Take 10 mLs (1 g total) by mouth 4 (four) times daily -  with meals and at bedtime. 11/28/15   Richarda OverlieAbrol, Nayana, MD  valsartan (DIOVAN) 80 MG tablet Take 80 mg by mouth daily.    [provider]  varenicline (CHANTIX) 1 MG tablet Take 1 mg by mouth 2 (two) times daily.    [provider]    Family History Family History  Problem Relation Age of Onset  . Congestive Heart Failure Sister   . Diabetes Mother   . Hypertension Mother   . Diabetes Father   . Hypertension Father     Social History Social History  Substance Use Topics  . Smoking status: Current Every Day Smoker    Packs/day: 1.00    Years: 29.00    Types: Cigarettes  . Smokeless tobacco: Never Used  . Alcohol use No     Allergies   Omeprazole magnesium   Review of Systems Review of Systems  Constitutional:       Per HPI, otherwise negative  HENT:       Per HPI, otherwise negative  Respiratory:       Per HPI, otherwise negative  Cardiovascular:       Per HPI, otherwise negative  Gastrointestinal: Positive for  abdominal pain, nausea and vomiting.  Endocrine:       Negative aside from HPI  Genitourinary:       Neg aside from HPI   Musculoskeletal:       Per HPI, otherwise negative  Skin:       Diaphoresis  Allergic/Immunologic: Negative for immunocompromised state.  Neurological: Positive for weakness and light-headedness. Negative for syncope.     Physical Exam Updated Vital Signs BP (!) 142/100   Pulse (!) 113   Temp 97.9 F (36.6 C) (Oral)   Resp 17   Ht 5\' 8"  (1.727 m)   Wt 170 lb (77.1 kg)   SpO2 99%  BMI 25.85 kg/m   Physical Exam  Constitutional: He is oriented to person, place, and time. He appears well-developed. No distress.  Uncomfortable appearing male diaphoretic, but awake and alert.  HENT:  Head: Normocephalic and atraumatic.  Eyes: Conjunctivae and EOM are normal.  Cardiovascular: Regular rhythm.  Tachycardia present.   Pulmonary/Chest: Effort normal. No stridor. No respiratory distress.  Abdominal: He exhibits no distension.    Musculoskeletal: He exhibits no edema.  Neurological: He is alert and oriented to person, place, and time.  Skin: Skin is warm and dry.  Psychiatric: His mood appears anxious.  Nursing note and vitals reviewed.    ED Treatments / Results  Labs (all labs ordered are listed, but only abnormal results are displayed) Labs Reviewed  COMPREHENSIVE METABOLIC PANEL - Abnormal; Notable for the following:       Result Value   Chloride 96 (*)    CO2 15 (*)    Glucose, Bld 245 (*)    BUN 23 (*)    Creatinine, Ser 1.53 (*)    ALT 13 (*)    Total Bilirubin 2.3 (*)    GFR calc non Af Amer 53 (*)    Anion gap 24 (*)    All other components within normal limits  CBC - Abnormal; Notable for the following:    WBC 19.3 (*)    Hemoglobin 18.4 (*)    HCT 52.7 (*)    All other components within normal limits  URINALYSIS, ROUTINE W REFLEX MICROSCOPIC - Abnormal; Notable for the following:    Color, Urine STRAW (*)    Glucose, UA >=500  (*)    Ketones, ur 80 (*)    Protein, ur 30 (*)    All other components within normal limits  LIPASE, BLOOD    EKG  EKG Interpretation  Date/Time:  Friday Oct 15 2016 07:39:08 EDT Ventricular Rate:  118 PR Interval:    QRS Duration: 130 QT Interval:  349 QTC Calculation: 489 R Axis:   -95 Text Interpretation:  Sinus tachycardia RBBB and LAFB ST elev, probable normal early repol pattern Baseline wander in lead(s) V3 No significant change since last tracing Abnormal ekg Confirmed by Gerhard Munch [phone]) on 10/15/2016 8:30:31 AM       Radiology US Abdomen Complete  Result Date: 10/15/2016 CLINICAL DATA:  Patient with nausea, vomiting and abdominal pain. EXAM: ABDOMEN ULTRASOUND COMPLETE COMPARISON:  CT abdomen pelvis 11/27/2015. FINDINGS: Gallbladder: No gallstones or wall thickening visualized. No sonographic Murphy sign noted by sonographer. Common bile duct: Diameter: 4 mm Liver: Increased in echogenicity.  No focal lesion identified. IVC: No abnormality visualized. Pancreas: Visualized portion unremarkable. Spleen: Size and appearance within normal limits. Right Kidney: Length: 10.9 cm. Echogenicity within normal limits. No mass or hydronephrosis visualized. Left Kidney: Length: 11.2 cm. Echogenicity within normal limits. No mass or hydronephrosis visualized. Abdominal aorta: No aneurysm visualized. Other findings: None. IMPRESSION: No acute process within the abdomen. Hepatic steatosis. Electronically Signed   By: Annia Belt M.D.   On: 10/15/2016 10:18    Procedures Procedures (including critical care time)  Medications Ordered in ED Medications  LORazepam (ATIVAN) injection 1 mg (not administered)  lidocaine (XYLOCAINE) 2 % viscous mouth solution 15 mL (not administered)  ondansetron (ZOFRAN) injection 4 mg (4 mg Intravenous Given 10/15/16 0732)  sodium chloride 0.9 % bolus 1,000 mL (1,000 mLs Intravenous New Bag/Given 10/15/16 0827)     Initial Impression / Assessment and  Plan / ED Course  I have reviewed  the triage vital signs and the nursing notes.  Pertinent labs & imaging results that were available during my care of the patient were reviewed by me and considered in my medical decision making (see chart for details).  Labs reviewed, EMR reviewedIncluding notation from visit 2 days ago.   2:33 PM Now, patient received multiple liters of fluid, multiple different antibiotics, continues to have nausea. Ultrasound, labs reviewed. No evidence for hepatobiliary dysfunction, though the patient does have mild elevation in bilirubin level. Patient does have mild hyperglycemia, ketonuria, and clinically dry suggesting hyperosmolar state.  The patient has only relatively elevated glucose level, with his anion gap, there is concern for DKA. Though he has improved somewhat, he remains tachycardic, and with the aforementioned lab abnormalities, ketonuria, persistent nausea, vomiting, the patient required admission for further evaluation, management.  Patient starting insulin drip    I discussed patient's case with our hospitalist colleagues.   Final Clinical Impressions(s) / ED Diagnoses  Intractable nausea and vomiting Diabetic ketoacidosis   Gerhard Munch, MD 10/15/16 1434  CRITICAL CARE Performed by: Gerhard Munch Total critical care time: 35 minutes Critical care time was exclusive of separately billable procedures and treating other patients. Critical care was necessary to treat or prevent imminent or life-threatening deterioration. Critical care was time spent personally by me on the following activities: development of treatment plan with patient and/or surrogate as well as nursing, discussions with consultants, evaluation of patient's response to treatment, examination of patient, obtaining history from patient or surrogate, ordering and performing treatments and interventions, ordering and review of laboratory studies, ordering and review of  radiographic studies, pulse oximetry and re-evaluation of patient's condition.     Gerhard Munch, MD 10/15/16 [phone]

## 2016-10-15 NOTE — ED Triage Notes (Signed)
Per Pt, Pt is coming from home with continuing nausea, vomiting, and diarrhea that has been constant since Tuesday. Pt was seen here on Wednesday and has had no relief for his symptoms. Pt reports lower abdominal pain and throat burning. Pt reports using medication with no relief.

## 2016-10-16 DIAGNOSIS — F418 Other specified anxiety disorders: Secondary | ICD-10-CM

## 2016-10-16 LAB — BASIC METABOLIC PANEL
Anion gap: 14 (ref 5–15)
Anion gap: 15 (ref 5–15)
BUN: 18 mg/dL (ref 6–20)
BUN: 18 mg/dL (ref 6–20)
CALCIUM: 8.4 mg/dL — AB (ref 8.9–10.3)
CHLORIDE: 102 mmol/L (ref 101–111)
CO2: 18 mmol/L — AB (ref 22–32)
CO2: 16 mmol/L — ABNORMAL LOW (ref 22–32)
CREATININE: 1.14 mg/dL (ref 0.61–1.24)
Calcium: 8.3 mg/dL — ABNORMAL LOW (ref 8.9–10.3)
Chloride: 102 mmol/L (ref 101–111)
Creatinine, Ser: 1.22 mg/dL (ref 0.61–1.24)
GFR calc Af Amer: >60 mL/min (ref >60)
GFR calc Af Amer: >60 mL/min (ref >60)
GFR calc non Af Amer: >60 mL/min (ref >60)
GLUCOSE: 180 mg/dL — AB (ref 65–99)
GLUCOSE: 188 mg/dL — AB (ref 65–99)
Potassium: 4.6 mmol/L (ref 3.5–5.1)
Potassium: 4.4 mmol/L (ref 3.5–5.1)
SODIUM: 133 mmol/L — AB (ref 135–145)
Sodium: 134 mmol/L — ABNORMAL LOW (ref 135–145)

## 2016-10-16 LAB — HEMOGLOBIN A1C
Hgb A1c MFr Bld: 10.9 % — ABNORMAL HIGH (ref 4.8–5.6)
Mean Plasma Glucose: 266 mg/dL

## 2016-10-16 LAB — GLUCOSE, CAPILLARY
GLUCOSE-CAPILLARY: 187 mg/dL — AB (ref 65–99)
Glucose-Capillary: 203 mg/dL — ABNORMAL HIGH (ref 65–99)
Glucose-Capillary: 152 mg/dL — ABNORMAL HIGH (ref 65–99)
Glucose-Capillary: 88 mg/dL (ref 65–99)
Glucose-Capillary: 105 mg/dL — ABNORMAL HIGH (ref 65–99)

## 2016-10-16 LAB — HIV ANTIBODY (ROUTINE TESTING W REFLEX): HIV Screen 4th Generation wRfx: NONREACTIVE

## 2016-10-16 MED ORDER — HYDRALAZINE HCL 20 MG/ML IJ SOLN: 5.0000 mg | Freq: Three times a day (TID) | INTRAMUSCULAR | Status: DC | PRN

## 2016-10-16 MED ORDER — POTASSIUM CHLORIDE IN NACL 20-0.9 MEQ/L-% IV SOLN
INTRAVENOUS | Status: DC
  Administered 2016-10-16: 11:00:00 via INTRAVENOUS
  Filled 2016-10-16 (×3): qty 1000

## 2016-10-16 MED ORDER — METOPROLOL TARTRATE 5 MG/5ML IV SOLN
5.0000 mg | Freq: Once | INTRAVENOUS | Status: AC
  Administered 2016-10-16: 5 mg via INTRAVENOUS
  Filled 2016-10-16: qty 5

## 2016-10-16 MED ORDER — ALUM & MAG HYDROXIDE-SIMETH 200-200-20 MG/5ML PO SUSP
30.0000 mL | ORAL | Status: DC | PRN
  Administered 2016-10-16: 30 mL via ORAL
  Filled 2016-10-16: qty 30

## 2016-10-16 NOTE — Progress Notes (Signed)
Pt b/p was 142/111 map 121 at 2100. Contacted MD, he prescribed metoprolol 2.5mg , b/p went down. Gave 2200 dose of metoprolol 50mg  oral   . Pt b/p is now again 166/122, contacted MD again, waiting on response will cont to monitor.

## 2016-10-16 NOTE — Progress Notes (Signed)
PROGRESS NOTE    [name] PettyDanny L [name]  UJW:[id number]:[id number] DOB: Oct 17, 1970 DOA: 10/15/2016 PCP: Lindaann PascalLong, Scott, PA-C   Brief Narrative: 46 year old male with history of diabetes on insulin presented with nausea vomiting in the setting of diabetic ketoacidosis. Patient was started on insulin drip and IV fluids on admission.  Assessment & Plan:  #Diabetic ketoacidosis without coma; transition to insulin to Lantus and insulin lispro. Discontinue dextrose IV and switched to normal saline. Patient is still having nausea and decreased oral intake. Continue IV fluid. Continue to monitor labs and blood sugar level. Diabetic educator referral. We will continue to treat and observe today. -A1c 10.9.  #Intractable nausea and vomiting: Likely in the setting of DKA. Continue IV fluids and supportive care.  # Hypertension: Continue Avapro, metoprolol. Monitor blood pressure.  #Gastroesophageal reflux disease: Continue Protonix also on Reglan.  #Anxiety depression: Continue Wellbutrin and Xanax as needed.  #Hyponatremia: Monitor BMP.  Principal Problem:   DKA (diabetic ketoacidoses) (HCC) Active Problems:   Intractable nausea and vomiting   Gastroesophageal reflux disease   Anxiety with depression   Essential hypertension  DVT prophylaxis: Lovenox subcutaneous Code Status: Full code  Family Communication: No family at bedside Disposition Plan: Likely discharge him tomorrow    Consultants:   None  Procedures: None Antimicrobials: None  Subjective: Seen and examined at bedside. Reported nausea and decreased oral intake. No vomiting today. Denied chest pain, shortness of breath.  Objective: Vitals:   10/16/16 0400 10/16/16 0700 10/16/16 0839 10/16/16 1100  BP: (!) 157/97  (!) 158/88 139/88  Pulse: 95  99   Resp: (!) 21  20 19   Temp:  98.8 F (37.1 C) 98.8 F (37.1 C) 98.8 F (37.1 C)  TempSrc:  Oral Oral Oral  SpO2: 96%  97% 98%  Weight:      Height:        Intake/Output Summary  (Last 24 hours) at 10/16/16 1417 Last data filed at 10/16/16 0651  Gross per 24 hour  Intake           1618.8 ml  Output             1600 ml  Net             18.8 ml   Filed Weights   10/15/16 0717  Weight: 77.1 kg (170 lb)    Examination:  General exam: Appears calm and comfortable  Respiratory system: Clear to auscultation. Respiratory effort normal. No wheezing or crackle Cardiovascular system: S1 & S2 heard, RRR.  No pedal edema. Gastrointestinal system: Abdomen is nondistended, soft and nontender. Normal bowel sounds heard. Central nervous system: Alert and oriented. No focal neurological deficits. Extremities: Symmetric 5 x 5 power. Skin: No rashes, lesions or ulcers Psychiatry: Judgement and insight appear normal. Mood & affect appropriate.     Data Reviewed: I have personally reviewed following labs and imaging studies  CBC:  Recent Labs Lab 10/12/16 1116 10/13/16 1812 10/15/16 0815  WBC 20.5* 22.1* 19.3*  HGB 18.5* 18.1* 18.4*  HCT 51.7 51.8 52.7*  MCV 90.4 92.0 91.8  PLT 243 243 238   Basic Metabolic Panel:  Recent Labs Lab 10/15/16 0815 10/15/16 1904 10/15/16 2207 10/16/16 0210 10/16/16 0545  NA 135 133* 135 134* 133*  K 4.4 4.9 4.7 4.6 4.4  CL 96* 103 103 102 102  CO2 15* 16* 18* 18* 16*  GLUCOSE 245* 170* 163* 180* 188*  BUN 23* 22* 21* 18 18  CREATININE 1.53* 1.34* 1.29* 1.22 1.14  CALCIUM 9.5 8.2* 8.3* 8.4* 8.3*  MG  --  2.6*  --   --   --    GFR: Estimated Creatinine Clearance: 77.5 mL/min (by C-G formula based on SCr of 1.14 mg/dL). Liver Function Tests:  Recent Labs Lab 10/12/16 1116 10/13/16 1812 10/15/16 0815  AST 18 19 15   ALT 16* 14* 13*  ALKPHOS 96 98 85  BILITOT 1.4* 2.1* 2.3*  PROT 7.5 7.6 7.2  ALBUMIN 4.1 4.3 3.9    Recent Labs Lab 10/12/16 1116 10/13/16 1812 10/15/16 0815  LIPASE 19 39 15   No results for input(s): AMMONIA in the last 168 hours. Coagulation Profile: No results for input(s): INR, PROTIME in  the last 168 hours. Cardiac Enzymes: No results for input(s): CKTOTAL, CKMB, CKMBINDEX, TROPONINI in the last 168 hours. BNP (last 3 results) No results for input(s): PROBNP in the last 8760 hours. HbA1C:  Recent Labs  10/15/16 1904  HGBA1C 10.9*   CBG:  Recent Labs Lab 10/15/16 1929 10/15/16 2028 10/15/16 2134 10/15/16 2346 10/16/16 0840  GLUCAP 168* 166* 153* 175* 187*   Lipid Profile: No results for input(s): CHOL, HDL, LDLCALC, TRIG, CHOLHDL, LDLDIRECT in the last 72 hours. Thyroid Function Tests: No results for input(s): TSH, T4TOTAL, FREET4, T3FREE, THYROIDAB in the last 72 hours. Anemia Panel: No results for input(s): VITAMINB12, FOLATE, FERRITIN, TIBC, IRON, RETICCTPCT in the last 72 hours. Sepsis Labs: No results for input(s): PROCALCITON, LATICACIDVEN in the last 168 hours.  Recent Results (from the past 240 hour(s))  MRSA PCR Screening     Status: None   Collection Time: 10/15/16  6:39 PM  Result Value Ref Range Status   MRSA by PCR NEGATIVE NEGATIVE Final    Comment:        The GeneXpert MRSA Assay (FDA approved for NASAL specimens only), is one component of a comprehensive MRSA colonization surveillance program. It is not intended to diagnose MRSA infection nor to guide or monitor treatment for MRSA infections.          Radiology Studies: US Abdomen Complete  Result Date: 10/15/2016 CLINICAL DATA:  Patient with nausea, vomiting and abdominal pain. EXAM: ABDOMEN ULTRASOUND COMPLETE COMPARISON:  CT abdomen pelvis 11/27/2015. FINDINGS: Gallbladder: No gallstones or wall thickening visualized. No sonographic Murphy sign noted by sonographer. Common bile duct: Diameter: 4 mm Liver: Increased in echogenicity.  No focal lesion identified. IVC: No abnormality visualized. Pancreas: Visualized portion unremarkable. Spleen: Size and appearance within normal limits. Right Kidney: Length: 10.9 cm. Echogenicity within normal limits. No mass or hydronephrosis  visualized. Left Kidney: Length: 11.2 cm. Echogenicity within normal limits. No mass or hydronephrosis visualized. Abdominal aorta: No aneurysm visualized. Other findings: None. IMPRESSION: No acute process within the abdomen. Hepatic steatosis. Electronically Signed   By: Annia Belt M.D.   On: 10/15/2016 10:18        Scheduled Meds: . aspirin  81 mg Oral QHS  . atorvastatin  40 mg Oral Daily  . buPROPion  100 mg Oral BID  . enoxaparin (LOVENOX) injection  40 mg Subcutaneous Q24H  . insulin aspart  0-15 Units Subcutaneous TID WC  . insulin aspart  0-5 Units Subcutaneous QHS  . insulin glargine  10 Units Subcutaneous QHS  . irbesartan  75 mg Oral Daily  . linagliptin  5 mg Oral Daily  . LORazepam  1 mg Intravenous Once  . metoCLOPramide  10 mg Oral Q6H  . metoprolol succinate  50 mg Oral QHS  . pantoprazole  40  mg Oral Daily   Continuous Infusions: . 0.9 % NaCl with KCl 20 mEq / L 100 mL/hr at 10/16/16 1047     LOS: 1 day    Loree Shehata Jaynie Collins, MD [facility] Pager [phone]  If 7PM-7AM, please contact night-coverage [email or web address] Password [email or web address] 10/16/2016, 2:17 PM

## 2016-10-17 DIAGNOSIS — E1010 Type 1 diabetes mellitus with ketoacidosis without coma: Secondary | ICD-10-CM

## 2016-10-17 LAB — BASIC METABOLIC PANEL
Anion gap: 14 (ref 5–15)
BUN: 13 mg/dL (ref 6–20)
CALCIUM: 8.3 mg/dL — AB (ref 8.9–10.3)
CO2: 16 mmol/L — ABNORMAL LOW (ref 22–32)
CREATININE: 1.08 mg/dL (ref 0.61–1.24)
Chloride: 105 mmol/L (ref 101–111)
GFR calc Af Amer: >60 mL/min (ref >60)
GLUCOSE: 81 mg/dL (ref 65–99)
POTASSIUM: 4 mmol/L (ref 3.5–5.1)
Sodium: 135 mmol/L (ref 135–145)

## 2016-10-17 LAB — GLUCOSE, CAPILLARY
GLUCOSE-CAPILLARY: 148 mg/dL — AB (ref 65–99)
Glucose-Capillary: 114 mg/dL — ABNORMAL HIGH (ref 65–99)

## 2016-10-17 NOTE — Discharge Summary (Signed)
Physician Discharge Summary  [name] [name] DOB: 12-Nov-1970 DOA: 10/15/2016  PCP: [name] PascalLong, Scott, PA-C  Admit date: 10/15/2016 Discharge date: 10/17/2016  Admitted From:home Disposition:home  Recommendations for Outpatient Follow-up:  1. Follow up with PCP in 1-2 weeks 2. Please obtain BMP/CBC in one week   Home Health:no Equipment/Devices:no Discharge Condition:stable CODE STATUS:full Diet recommendation:diabetic heart healthy diet  Brief/Interim Summary: 46 year old male with history of diabetes on insulin presented with nausea vomiting in the setting of diabetic ketoacidosis. Patient was started on insulin drip and IV fluids on admission.  #Diabetic ketoacidosis without coma;Anion gap closed. Blood sugar level better controlled. Transition to long-acting insulin. Patient's oral intake is gradually improving. Denied nausea or vomiting. He reported that he takes both oral hypoglycemic agent as well as insulin at home. I continued his home care indication on discharge with instructions to follow up with PCP and monitor blood sugar level at home. He verbalized understanding. He is tolerating diet. -A1c 10.9.  #Intractable nausea and vomiting: Likely in the setting of DKA. Improved. Continue supportive care and follow-up with PCP.  # Hypertension: Continue Avapro, metoprolol. Monitor blood pressure.  #Gastroesophageal reflux disease: Continue PT/INR and Reglan.  #Anxiety depression: Continue Wellbutrin and Xanax as needed.  #Hyponatremia: Monitor BMP. Improvement  DKA improved. Needs follow-up with PCP closely for diabetic management and further care. He verbalized understanding. I'm not changing his home medication at time because of his presentation with DKA and elevated A1c. I asked him to follow up with PCP.   Discharge Diagnoses:  Principal Problem:   DKA (diabetic ketoacidoses) (HCC) Active Problems:   Intractable nausea and vomiting   Gastroesophageal  reflux disease   Anxiety with depression   Essential hypertension    Discharge Instructions  Discharge Instructions    Call MD for:  difficulty breathing, headache or visual disturbances    Complete by:  As directed    Call MD for:  extreme fatigue    Complete by:  As directed    Call MD for:  hives    Complete by:  As directed    Call MD for:  persistant dizziness or light-headedness    Complete by:  As directed    Call MD for:  persistant nausea and vomiting    Complete by:  As directed    Call MD for:  severe uncontrolled pain    Complete by:  As directed    Call MD for:  temperature >100.4    Complete by:  As directed    Diet - low sodium heart healthy    Complete by:  As directed    Diet Carb Modified    Complete by:  As directed    Discharge instructions    Complete by:  As directed    Please check blood sugar level three a day at home and follow up with PCP in one week.   Increase activity slowly    Complete by:  As directed      Allergies as of 10/17/2016      Reactions   Omeprazole Magnesium Swelling   Esomeprazole Other (See Comments)   Facial swelling      Medication List    STOP taking these medications   cefdinir 300 MG capsule Commonly known as:  OMNICEF     TAKE these medications   ALPRAZolam 0.5 MG tablet Commonly known as:  XANAX TAKE 1/2 TO 1 TABLET BY MOUTH DAILY AT BEDTIME *MAY TAKE 3 TIMES DAILY AS NEEDED FOR ANXIETY  aspirin 81 MG chewable tablet Chew 81 mg by mouth at bedtime.   atorvastatin 40 MG tablet Commonly known as:  LIPITOR Take 40 mg by mouth daily.   buPROPion 100 MG 12 hr tablet Commonly known as:  WELLBUTRIN SR Take 100 mg by mouth 2 (two) times daily.   DEXILANT 60 MG capsule Generic drug:  dexlansoprazole Take 60 mg by mouth daily.   Fenofibric Acid 105 MG Tabs Take 1 tablet by mouth daily.   glimepiride 4 MG tablet Commonly known as:  AMARYL Take 4 mg by mouth daily with breakfast.   GLYXAMBI 10-5 MG  Tabs Generic drug:  Empagliflozin-Linagliptin Take 1 tablet by mouth every evening.   metFORMIN 500 MG tablet Commonly known as:  GLUCOPHAGE Take 1 tablet (500 mg total) by mouth 2 (two) times daily with a meal.   metoCLOPramide 10 MG tablet Commonly known as:  REGLAN Take 1 tablet (10 mg total) by mouth every 6 (six) hours.   metoprolol succinate 50 MG 24 hr tablet Commonly known as:  TOPROL-XL Take 50 mg by mouth at bedtime.   nitroGLYCERIN 0.4 MG SL tablet Commonly known as:  NITROSTAT Place 0.4 mg under the tongue every 5 (five) minutes as needed for chest pain.   ondansetron 4 MG disintegrating tablet Commonly known as:  ZOFRAN ODT Take 1 tablet (4 mg total) by mouth every 8 (eight) hours as needed for nausea or vomiting.   promethazine 25 MG tablet Commonly known as:  PHENERGAN Take 1 tablet (25 mg total) by mouth every 6 (six) hours as needed for nausea or vomiting.   promethazine 25 MG suppository Commonly known as:  PHENERGAN Place 1 suppository (25 mg total) rectally every 6 (six) hours as needed for nausea or vomiting.   TRESIBA FLEXTOUCH 200 UNIT/ML Sopn Generic drug:  Insulin Degludec Inject 24 Units into the skin at bedtime.   valsartan 160 MG tablet Commonly known as:  DIOVAN Take 160 mg by mouth daily.      Follow-up Information    [name] [name], [place]. Schedule an appointment as soon as possible for a visit in 1 week(s).   Specialty:  Physician Assistant Contact information: [address] [place] [address] [phone]          Allergies  Allergen Reactions  . Omeprazole Magnesium Swelling  . Esomeprazole Other (See Comments)    Facial swelling    Consultations: None  Procedures/Studies: None  Subjective: Seen and examined at bedside. Denied headache, dizziness, nausea, vomiting, chest pain, shortness of breath. Feels better today.  Discharge Exam: Vitals:   10/16/16 2339 10/17/16 0331  BP: (!) 133/98 117/88  Pulse:  (!) 106 89  Resp:  (!) 21  Temp:  98 F (36.7 C)   Vitals:   10/16/16 2032 10/16/16 2327 10/16/16 2339 10/17/16 0331  BP: 122/86 108/86 (!) 133/98 117/88  Pulse: (!) 101 (!) 108 (!) 106 89  Resp: 17 (!) 23  (!) 21  Temp: 99 F (37.2 C) 98.7 F (37.1 C)  98 F (36.7 C)  TempSrc: Oral Oral  Oral  SpO2: 98% 96%  100%  Weight:      Height:        General: Pt is alert, awake, not in acute distress Cardiovascular: RRR, S1/S2 +, no rubs, no gallops Respiratory: CTA bilaterally, no wheezing, no rhonchi Abdominal: Soft, NT, ND, bowel sounds + Extremities: no edema, no cyanosis    The results of significant diagnostics from this hospitalization (including imaging,  microbiology, ancillary and laboratory) are listed below for reference.     Microbiology: Recent Results (from the past 240 hour(s))  MRSA PCR Screening     Status: None   Collection Time: 10/15/16  6:39 PM  Result Value Ref Range Status   MRSA by PCR NEGATIVE NEGATIVE Final    Comment:        The GeneXpert MRSA Assay (FDA approved for NASAL specimens only), is one component of a comprehensive MRSA colonization surveillance program. It is not intended to diagnose MRSA infection nor to guide or monitor treatment for MRSA infections.      Labs: BNP (last 3 results) No results for input(s): BNP in the last 8760 hours. Basic Metabolic Panel:  Recent Labs Lab 10/15/16 1904 10/15/16 2207 10/16/16 0210 10/16/16 0545 10/17/16 0254  NA 133* 135 134* 133* 135  K 4.9 4.7 4.6 4.4 4.0  CL 103 103 102 102 105  CO2 16* 18* 18* 16* 16*  GLUCOSE 170* 163* 180* 188* 81  BUN 22* 21* 18 18 13   CREATININE 1.34* 1.29* 1.22 1.14 1.08  CALCIUM 8.2* 8.3* 8.4* 8.3* 8.3*  MG 2.6*  --   --   --   --    Liver Function Tests:  Recent Labs Lab 10/12/16 1116 10/13/16 1812 10/15/16 0815  AST 18 19 15   ALT 16* 14* 13*  ALKPHOS 96 98 85  BILITOT 1.4* 2.1* 2.3*  PROT 7.5 7.6 7.2  ALBUMIN 4.1 4.3 3.9    Recent  Labs Lab 10/12/16 1116 10/13/16 1812 10/15/16 0815  LIPASE 19 39 15   No results for input(s): AMMONIA in the last 168 hours. CBC:  Recent Labs Lab 10/12/16 1116 10/13/16 1812 10/15/16 0815  WBC 20.5* 22.1* 19.3*  HGB 18.5* 18.1* 18.4*  HCT 51.7 51.8 52.7*  MCV 90.4 92.0 91.8  PLT 243 243 238   Cardiac Enzymes: No results for input(s): CKTOTAL, CKMB, CKMBINDEX, TROPONINI in the last 168 hours. BNP: Invalid input(s): POCBNP CBG:  Recent Labs Lab 10/16/16 0332 10/16/16 0840 10/16/16 1145 10/16/16 1645 10/16/16 2133  GLUCAP 203* 187* 152* 88 105*   D-Dimer No results for input(s): DDIMER in the last 72 hours. Hgb A1c  Recent Labs  10/15/16 1904  HGBA1C 10.9*   Lipid Profile No results for input(s): CHOL, HDL, LDLCALC, TRIG, CHOLHDL, LDLDIRECT in the last 72 hours. Thyroid function studies No results for input(s): TSH, T4TOTAL, T3FREE, THYROIDAB in the last 72 hours.  Invalid input(s): FREET3 Anemia work up No results for input(s): VITAMINB12, FOLATE, FERRITIN, TIBC, IRON, RETICCTPCT in the last 72 hours. Urinalysis    Component Value Date/Time   COLORURINE STRAW (A) 10/15/2016 1230   APPEARANCEUR CLEAR 10/15/2016 1230   LABSPEC 1.026 10/15/2016 1230   PHURINE 5.0 10/15/2016 1230   GLUCOSEU >=500 (A) 10/15/2016 1230   HGBUR NEGATIVE 10/15/2016 1230   BILIRUBINUR NEGATIVE 10/15/2016 1230   KETONESUR 80 (A) 10/15/2016 1230   PROTEINUR 30 (A) 10/15/2016 1230   NITRITE NEGATIVE 10/15/2016 1230   LEUKOCYTESUR NEGATIVE 10/15/2016 1230   Sepsis Labs Invalid input(s): PROCALCITONIN,  WBC,  LACTICIDVEN Microbiology Recent Results (from the past 240 hour(s))  MRSA PCR Screening     Status: None   Collection Time: 10/15/16  6:39 PM  Result Value Ref Range Status   MRSA by PCR NEGATIVE NEGATIVE Final    Comment:        The GeneXpert MRSA Assay (FDA approved for NASAL specimens only), is one component of a comprehensive MRSA  colonization surveillance  program. It is not intended to diagnose MRSA infection nor to guide or monitor treatment for MRSA infections.      Time coordinating discharge: 31 minutes  SIGNED:   Maxie Barb, MD  [facility] 10/17/2016, 11:36 AM  If 7PM-7AM, please contact night-coverage [email or web address] Password [email or web address]

## 2016-12-06 ENCOUNTER — Ambulatory Visit (HOSPITAL_COMMUNITY): Payer: 59

## 2016-12-13 ENCOUNTER — Encounter (HOSPITAL_COMMUNITY): Payer: 59

## 2016-12-13 ENCOUNTER — Encounter (HOSPITAL_COMMUNITY): Payer: 59 | Attending: Oncology | Admitting: Oncology

## 2016-12-13 ENCOUNTER — Encounter (HOSPITAL_COMMUNITY): Payer: Self-pay

## 2016-12-13 VITALS — BP 151/96 | HR 105 | Temp 98.3°F | Resp 16 | Ht 68.0 in | Wt 176.1 lb

## 2016-12-13 DIAGNOSIS — F17210 Nicotine dependence, cigarettes, uncomplicated: Secondary | ICD-10-CM | POA: Diagnosis not present

## 2016-12-13 DIAGNOSIS — Z720 Tobacco use: Secondary | ICD-10-CM | POA: Diagnosis not present

## 2016-12-13 DIAGNOSIS — Z833 Family history of diabetes mellitus: Secondary | ICD-10-CM | POA: Diagnosis not present

## 2016-12-13 DIAGNOSIS — Z8249 Family history of ischemic heart disease and other diseases of the circulatory system: Secondary | ICD-10-CM | POA: Diagnosis not present

## 2016-12-13 DIAGNOSIS — Z888 Allergy status to other drugs, medicaments and biological substances status: Secondary | ICD-10-CM | POA: Diagnosis not present

## 2016-12-13 DIAGNOSIS — Z79899 Other long term (current) drug therapy: Secondary | ICD-10-CM | POA: Diagnosis not present

## 2016-12-13 DIAGNOSIS — D72829 Elevated white blood cell count, unspecified: Secondary | ICD-10-CM | POA: Diagnosis not present

## 2016-12-13 DIAGNOSIS — E119 Type 2 diabetes mellitus without complications: Secondary | ICD-10-CM | POA: Insufficient documentation

## 2016-12-13 DIAGNOSIS — E785 Hyperlipidemia, unspecified: Secondary | ICD-10-CM | POA: Insufficient documentation

## 2016-12-13 DIAGNOSIS — Z7984 Long term (current) use of oral hypoglycemic drugs: Secondary | ICD-10-CM | POA: Diagnosis not present

## 2016-12-13 DIAGNOSIS — Z7982 Long term (current) use of aspirin: Secondary | ICD-10-CM | POA: Diagnosis not present

## 2016-12-13 DIAGNOSIS — I10 Essential (primary) hypertension: Secondary | ICD-10-CM | POA: Diagnosis not present

## 2016-12-13 DIAGNOSIS — Z9889 Other specified postprocedural states: Secondary | ICD-10-CM | POA: Diagnosis not present

## 2016-12-13 LAB — CBC WITH DIFFERENTIAL/PLATELET
BASOS ABS: 0.1 10*3/uL (ref 0.0–0.1)
BASOS PCT: 0 %
Eosinophils Absolute: 0.3 10*3/uL (ref 0.0–0.7)
Eosinophils Relative: 3 %
HEMATOCRIT: 44.8 % (ref 39.0–52.0)
HEMOGLOBIN: 15.5 g/dL (ref 13.0–17.0)
Lymphocytes Relative: 27 %
Lymphs Abs: 3.1 10*3/uL (ref 0.7–4.0)
MCH: 31.9 pg (ref 26.0–34.0)
MCHC: 34.6 g/dL (ref 30.0–36.0)
MCV: 92.2 fL (ref 78.0–100.0)
Monocytes Absolute: 0.6 10*3/uL (ref 0.1–1.0)
Monocytes Relative: 5 %
NEUTROS ABS: 7.5 10*3/uL (ref 1.7–7.7)
NEUTROS PCT: 65 %
Platelets: 214 10*3/uL (ref 150–400)
RBC: 4.86 MIL/uL (ref 4.22–5.81)
RDW: 12.9 % (ref 11.5–15.5)
WBC: 11.6 10*3/uL — AB (ref 4.0–10.5)

## 2016-12-13 LAB — COMPREHENSIVE METABOLIC PANEL
ALBUMIN: 3.7 g/dL (ref 3.5–5.0)
ALK PHOS: 98 U/L (ref 38–126)
ALT: 15 U/L — ABNORMAL LOW (ref 17–63)
AST: 15 U/L (ref 15–41)
Anion gap: 10 (ref 5–15)
BILIRUBIN TOTAL: 0.7 mg/dL (ref 0.3–1.2)
BUN: 12 mg/dL (ref 6–20)
CALCIUM: 9.5 mg/dL (ref 8.9–10.3)
CO2: 26 mmol/L (ref 22–32)
Chloride: 109 mmol/L (ref 101–111)
Creatinine, Ser: 0.88 mg/dL (ref 0.61–1.24)
GFR calc Af Amer: >60 mL/min (ref >60)
GFR calc non Af Amer: >60 mL/min (ref >60)
GLUCOSE: 367 mg/dL — AB (ref 65–99)
POTASSIUM: 4.7 mmol/L (ref 3.5–5.1)
SODIUM: 145 mmol/L (ref 135–145)
TOTAL PROTEIN: 6.9 g/dL (ref 6.5–8.1)

## 2016-12-13 NOTE — Progress Notes (Signed)
[facility] Initial Visit:  Patient Care Team: Shanon Rosser, PA-C as PCP - General (Physician Assistant) Adrian Prows, MD as Consulting Physician (Cardiology)  CHIEF COMPLAINTS/PURPOSE OF CONSULTATION:  Leukocytosis  HISTORY OF PRESENTING ILLNESS: [name] [name] 46 y.o. male is here because of Leukocytosis. Patient states he has had leukocytosis for about a year. He does have chronic sinus infections. His last CBC from 10/15/16 demonstrated WBC 19.3 K, hemoglobin 18.4 g/dl, hematocrit 52.7%, platelet count 238K. No differential was available. Review of his CBCs dating back to March 2017 demonstrated that his WBC has ranged between 12K the 27K. patient states that in May 2018 he did have a sinus infection as well as dental issues for which he was taking antibiotics. He states that currently he has chronic allergies but otherwise is not having an infection. He states he has occasional shortness breath. Denies any chest pain, abdominal pain, focal weakness. He states he has night sweats as well as fluctuating weight. Denies any unexplained fevers or chills. He is a chronic smoker and has smoked 1 pack per day for the past 32 years. He denies any chronic steroid use.  Review of Systems  Constitutional: Positive for unexpected weight change. Negative for chills and fever.  HENT:  Negative.   Eyes: Negative.   Respiratory: Positive for shortness of breath. Negative for chest tightness and cough.   Cardiovascular: Negative.   Gastrointestinal: Negative.   Endocrine: Negative.   Genitourinary: Negative.    Musculoskeletal: Negative.   Skin: Negative.   Neurological: Negative.   Hematological: Negative for adenopathy. Bruises/bleeds easily.  Psychiatric/Behavioral: Negative.     MEDICAL HISTORY: Past Medical History:  Diagnosis Date  . Atypical chest pain   . Diabetes mellitus without complication ([place])   . Hyperlipemia   . Hypertension   . Malaise and fatigue      SURGICAL HISTORY: Past Surgical History:  Procedure Laterality Date  . [place]  . APPENDECTOMY  1975  . CARDIAC CATHETERIZATION N/A 03/28/2015   Procedure: Left Heart Cath and Coronary Angiography;  Surgeon: Adrian Prows, MD;  Location: [facility];  Service: Cardiovascular;  Laterality: N/A;  . CARDIAC CATHETERIZATION N/A 03/28/2015   Procedure: Intravascular Pressure Wire/FFR Study;  Surgeon: Adrian Prows, MD;  Location: [facility];  Service: Cardiovascular;  Laterality: N/A;    SOCIAL HISTORY: Social History   Social History  . Marital status: Single    Spouse name: N/A  . Number of children: 0  . Years of education: 10th g   Occupational History  . [employer]    Social History Main Topics  . Smoking status: Current Every Day Smoker    Packs/day: 1.00    Years: 29.00    Types: Cigarettes  . Smokeless tobacco: Never Used  . Alcohol use No  . Drug use: No  . Sexual activity: Not on file   Other Topics Concern  . Not on file   Social History Narrative  . No narrative on file    FAMILY HISTORY Family History  Problem Relation Age of Onset  . Congestive Heart Failure Sister   . Diabetes Mother   . Hypertension Mother   . Diabetes Father   . Hypertension Father     ALLERGIES:  is allergic to omeprazole magnesium and esomeprazole.  MEDICATIONS:  Current Outpatient Prescriptions  Medication Sig Dispense Refill  . ALPRAZolam (XANAX) 0.5 MG tablet TAKE 1/2 TO 1 TABLET BY MOUTH DAILY  AT BEDTIME *MAY TAKE 3 TIMES DAILY AS NEEDED FOR ANXIETY  1  . aspirin 81 MG chewable tablet Chew 81 mg by mouth at bedtime.     Marland Kitchen atorvastatin (LIPITOR) 40 MG tablet Take 40 mg by mouth daily.     Marland Kitchen buPROPion (WELLBUTRIN SR) 100 MG 12 hr tablet Take 100 mg by mouth 2 (two) times daily.    Marland Kitchen dexlansoprazole (DEXILANT) 60 MG capsule Take 60 mg by mouth daily.    . Empagliflozin-Linagliptin (GLYXAMBI) 10-5 MG TABS Take 1 tablet by mouth every  evening.    . Fenofibric Acid 105 MG TABS Take 1 tablet by mouth daily.     Marland Kitchen glimepiride (AMARYL) 4 MG tablet Take 4 mg by mouth daily with breakfast.    . Insulin Degludec (TRESIBA FLEXTOUCH) 200 UNIT/ML SOPN Inject 24 Units into the skin at bedtime.     . metFORMIN (GLUCOPHAGE) 500 MG tablet Take 1 tablet (500 mg total) by mouth 2 (two) times daily with a meal.    . metoCLOPramide (REGLAN) 10 MG tablet Take 1 tablet (10 mg total) by mouth every 6 (six) hours. 120 tablet 0  . metoprolol succinate (TOPROL-XL) 50 MG 24 hr tablet Take 50 mg by mouth at bedtime.   2  . nitroGLYCERIN (NITROSTAT) 0.4 MG SL tablet Place 0.4 mg under the tongue every 5 (five) minutes as needed for chest pain.    Marland Kitchen ondansetron (ZOFRAN ODT) 4 MG disintegrating tablet Take 1 tablet (4 mg total) by mouth every 8 (eight) hours as needed for nausea or vomiting. 20 tablet 0  . promethazine (PHENERGAN) 25 MG suppository Place 1 suppository (25 mg total) rectally every 6 (six) hours as needed for nausea or vomiting. 12 each 0  . promethazine (PHENERGAN) 25 MG tablet Take 1 tablet (25 mg total) by mouth every 6 (six) hours as needed for nausea or vomiting. 30 tablet 0  . valsartan (DIOVAN) 160 MG tablet Take 160 mg by mouth daily.      No current facility-administered medications for this visit.     PHYSICAL EXAMINATION:  ECOG PERFORMANCE STATUS: 0 - Asymptomatic   Vitals:   12/13/16 0829  BP: (!) 151/96  Pulse: (!) 105  Resp: 16  Temp: 98.3 F (36.8 C)    Filed Weights   12/13/16 0829  Weight: 176 lb 1.6 oz (79.9 kg)     Physical Exam  Constitutional: He is oriented to person, place, and time and well-developed, well-nourished, and in no distress. No distress.  HENT:  Head: Normocephalic and atraumatic.  Mouth/Throat: No oropharyngeal exudate.  Eyes: Pupils are equal, round, and reactive to light. Conjunctivae are normal. No scleral icterus.  Neck: Normal range of motion. Neck supple. No JVD present.   Cardiovascular: Normal rate, regular rhythm and normal heart sounds.  Exam reveals no gallop and no friction rub.   No murmur heard. Pulmonary/Chest: Breath sounds normal. No respiratory distress. He has no wheezes. He has no rales.  Abdominal: Soft. Bowel sounds are normal. He exhibits no distension. There is no tenderness. There is no guarding.  Musculoskeletal: He exhibits no edema or tenderness.  Lymphadenopathy:    He has no cervical adenopathy.  Neurological: He is alert and oriented to person, place, and time. No cranial nerve deficit.  Skin: Skin is warm and dry. No rash noted. No erythema. No pallor.  Psychiatric: Affect and judgment normal.     LABORATORY DATA: I have personally reviewed the data as listed:  No  visits with results within 1 Month(s) from this visit.  Latest known visit with results is:  Admission on 10/15/2016, Discharged on 10/17/2016  Component Date Value Ref Range Status  . Lipase 10/15/2016 15  11 - 51 U/L Final  . Sodium 10/15/2016 135  135 - 145 mmol/L Final  . Potassium 10/15/2016 4.4  3.5 - 5.1 mmol/L Final  . Chloride 10/15/2016 96* 101 - 111 mmol/L Final  . CO2 10/15/2016 15* 22 - 32 mmol/L Final  . Glucose, Bld 10/15/2016 245* 65 - 99 mg/dL Final  . BUN 10/15/2016 23* 6 - 20 mg/dL Final  . Creatinine, Ser 10/15/2016 1.53* 0.61 - 1.24 mg/dL Final  . Calcium 10/15/2016 9.5  8.9 - 10.3 mg/dL Final  . Total Protein 10/15/2016 7.2  6.5 - 8.1 g/dL Final  . Albumin 10/15/2016 3.9  3.5 - 5.0 g/dL Final  . AST 10/15/2016 15  15 - 41 U/L Final  . ALT 10/15/2016 13* 17 - 63 U/L Final  . Alkaline Phosphatase 10/15/2016 85  38 - 126 U/L Final  . Total Bilirubin 10/15/2016 2.3* 0.3 - 1.2 mg/dL Final  . GFR calc non Af Amer 10/15/2016 53* >60 mL/min Final  . GFR calc Af Amer 10/15/2016 >60  >60 mL/min Final   Comment: (NOTE) The eGFR has been calculated using the CKD EPI equation. This calculation has not been validated in all clinical  situations. eGFR's persistently <60 mL/min signify possible Chronic Kidney Disease.   . Anion gap 10/15/2016 24* 5 - 15 Final  . WBC 10/15/2016 19.3* 4.0 - 10.5 K/uL Final  . RBC 10/15/2016 5.74  4.22 - 5.81 MIL/uL Final  . Hemoglobin 10/15/2016 18.4* 13.0 - 17.0 g/dL Final  . HCT 10/15/2016 52.7* 39.0 - 52.0 % Final  . MCV 10/15/2016 91.8  78.0 - 100.0 fL Final  . MCH 10/15/2016 32.1  26.0 - 34.0 pg Final  . MCHC 10/15/2016 34.9  30.0 - 36.0 g/dL Final  . RDW 10/15/2016 12.4  11.5 - 15.5 % Final  . Platelets 10/15/2016 238  150 - 400 K/uL Final  . Color, Urine 10/15/2016 STRAW* YELLOW Final  . APPearance 10/15/2016 CLEAR  CLEAR Final  . Specific Gravity, Urine 10/15/2016 1.026  1.005 - 1.030 Final  . pH 10/15/2016 5.0  5.0 - 8.0 Final  . Glucose, UA 10/15/2016 >=500* NEGATIVE mg/dL Final  . Hgb urine dipstick 10/15/2016 NEGATIVE  NEGATIVE Final  . Bilirubin Urine 10/15/2016 NEGATIVE  NEGATIVE Final  . Ketones, ur 10/15/2016 80* NEGATIVE mg/dL Final  . Protein, ur 10/15/2016 30* NEGATIVE mg/dL Final  . Nitrite 10/15/2016 NEGATIVE  NEGATIVE Final  . Leukocytes, UA 10/15/2016 NEGATIVE  NEGATIVE Final  . RBC / HPF 10/15/2016 0-5  0 - 5 RBC/hpf Final  . WBC, UA 10/15/2016 0-5  0 - 5 WBC/hpf Final  . Bacteria, UA 10/15/2016 NONE SEEN  NONE SEEN Final  . Squamous Epithelial / LPF 10/15/2016 NONE SEEN  NONE SEEN Final  . HIV Screen 4th Generation wRfx 10/15/2016 Non Reactive  Non Reactive Final   Comment: (NOTE) Performed At: [facility] [place], [place] [id number] Lindon Romp MD NW:[id number]   . Sodium 10/15/2016 133* 135 - 145 mmol/L Final  . Potassium 10/15/2016 4.9  3.5 - 5.1 mmol/L Final  . Chloride 10/15/2016 103  101 - 111 mmol/L Final  . CO2 10/15/2016 16* 22 - 32 mmol/L Final  . Glucose, Bld 10/15/2016 170* 65 - 99 mg/dL Final  . BUN 10/15/2016  22* 6 - 20 mg/dL Final  . Creatinine, Ser 10/15/2016 1.34* 0.61 - 1.24 mg/dL Final  . Calcium  10/15/2016 8.2* 8.9 - 10.3 mg/dL Final  . GFR calc non Af Amer 10/15/2016 >60  >60 mL/min Final  . GFR calc Af Amer 10/15/2016 >60  >60 mL/min Final   Comment: (NOTE) The eGFR has been calculated using the CKD EPI equation. This calculation has not been validated in all clinical situations. eGFR's persistently <60 mL/min signify possible Chronic Kidney Disease.   . Anion gap 10/15/2016 14  5 - 15 Final  . Sodium 10/15/2016 135  135 - 145 mmol/L Final  . Potassium 10/15/2016 4.7  3.5 - 5.1 mmol/L Final  . Chloride 10/15/2016 103  101 - 111 mmol/L Final  . CO2 10/15/2016 18* 22 - 32 mmol/L Final  . Glucose, Bld 10/15/2016 163* 65 - 99 mg/dL Final  . BUN 10/15/2016 21* 6 - 20 mg/dL Final  . Creatinine, Ser 10/15/2016 1.29* 0.61 - 1.24 mg/dL Final  . Calcium 10/15/2016 8.3* 8.9 - 10.3 mg/dL Final  . GFR calc non Af Amer 10/15/2016 >60  >60 mL/min Final  . GFR calc Af Amer 10/15/2016 >60  >60 mL/min Final   Comment: (NOTE) The eGFR has been calculated using the CKD EPI equation. This calculation has not been validated in all clinical situations. eGFR's persistently <60 mL/min signify possible Chronic Kidney Disease.   . Anion gap 10/15/2016 14  5 - 15 Final  . Sodium 10/16/2016 134* 135 - 145 mmol/L Final  . Potassium 10/16/2016 4.6  3.5 - 5.1 mmol/L Final  . Chloride 10/16/2016 102  101 - 111 mmol/L Final  . CO2 10/16/2016 18* 22 - 32 mmol/L Final  . Glucose, Bld 10/16/2016 180* 65 - 99 mg/dL Final  . BUN 10/16/2016 18  6 - 20 mg/dL Final  . Creatinine, Ser 10/16/2016 1.22  0.61 - 1.24 mg/dL Final  . Calcium 10/16/2016 8.4* 8.9 - 10.3 mg/dL Final  . GFR calc non Af Amer 10/16/2016 >60  >60 mL/min Final  . GFR calc Af Amer 10/16/2016 >60  >60 mL/min Final   Comment: (NOTE) The eGFR has been calculated using the CKD EPI equation. This calculation has not been validated in all clinical situations. eGFR's persistently <60 mL/min signify possible Chronic Kidney Disease.   . Anion  gap 10/16/2016 14  5 - 15 Final  . Magnesium 10/15/2016 2.6* 1.7 - 2.4 mg/dL Final  . Hgb A1c MFr Bld 10/15/2016 10.9* 4.8 - 5.6 % Final   Comment: (NOTE)         Pre-diabetes: 5.7 - 6.4         Diabetes: >6.4         Glycemic control for adults with diabetes: <7.0   . Mean Plasma Glucose 10/15/2016 266  mg/dL Final   Comment: (NOTE) Performed At: [facility] [place] [place], [place] [id number] Lindon Romp MD WU:[id number]   . Glucose-Capillary 10/15/2016 189* 65 - 99 mg/dL Final  . Sodium 10/16/2016 133* 135 - 145 mmol/L Final  . Potassium 10/16/2016 4.4  3.5 - 5.1 mmol/L Final  . Chloride 10/16/2016 102  101 - 111 mmol/L Final  . CO2 10/16/2016 16* 22 - 32 mmol/L Final  . Glucose, Bld 10/16/2016 188* 65 - 99 mg/dL Final  . BUN 10/16/2016 18  6 - 20 mg/dL Final  . Creatinine, Ser 10/16/2016 1.14  0.61 - 1.24 mg/dL Final  . Calcium 10/16/2016 8.3* 8.9 - 10.3  mg/dL Final  . GFR calc non Af Amer 10/16/2016 >60  >60 mL/min Final  . GFR calc Af Amer 10/16/2016 >60  >60 mL/min Final   Comment: (NOTE) The eGFR has been calculated using the CKD EPI equation. This calculation has not been validated in all clinical situations. eGFR's persistently <60 mL/min signify possible Chronic Kidney Disease.   . Anion gap 10/16/2016 15  5 - 15 Final  . Glucose-Capillary 10/15/2016 169* 65 - 99 mg/dL Final  . MRSA by PCR 10/15/2016 NEGATIVE  NEGATIVE Final   Comment:        The GeneXpert MRSA Assay (FDA approved for NASAL specimens only), is one component of a comprehensive MRSA colonization surveillance program. It is not intended to diagnose MRSA infection nor to guide or monitor treatment for MRSA infections.   . Glucose-Capillary 10/15/2016 168* 65 - 99 mg/dL Final  . Glucose-Capillary 10/15/2016 166* 65 - 99 mg/dL Final  . Glucose-Capillary 10/15/2016 153* 65 - 99 mg/dL Final  . Glucose-Capillary 10/15/2016 175* 65 - 99 mg/dL Final  . Glucose-Capillary 10/16/2016  187* 65 - 99 mg/dL Final  . Comment 1 10/16/2016 Notify RN   Final  . Comment 2 10/16/2016 Document in Chart   Final  . Sodium 10/17/2016 135  135 - 145 mmol/L Final  . Potassium 10/17/2016 4.0  3.5 - 5.1 mmol/L Final  . Chloride 10/17/2016 105  101 - 111 mmol/L Final  . CO2 10/17/2016 16* 22 - 32 mmol/L Final  . Glucose, Bld 10/17/2016 81  65 - 99 mg/dL Final  . BUN 10/17/2016 13  6 - 20 mg/dL Final  . Creatinine, Ser 10/17/2016 1.08  0.61 - 1.24 mg/dL Final  . Calcium 10/17/2016 8.3* 8.9 - 10.3 mg/dL Final  . GFR calc non Af Amer 10/17/2016 >60  >60 mL/min Final  . GFR calc Af Amer 10/17/2016 >60  >60 mL/min Final   Comment: (NOTE) The eGFR has been calculated using the CKD EPI equation. This calculation has not been validated in all clinical situations. eGFR's persistently <60 mL/min signify possible Chronic Kidney Disease.   . Anion gap 10/17/2016 14  5 - 15 Final  . Glucose-Capillary 10/16/2016 152* 65 - 99 mg/dL Final  . Comment 1 10/16/2016 Notify RN   Final  . Comment 2 10/16/2016 Document in Chart   Final  . Glucose-Capillary 10/16/2016 88  65 - 99 mg/dL Final  . Comment 1 10/16/2016 Notify RN   Final  . Comment 2 10/16/2016 Document in Chart   Final  . Glucose-Capillary 10/16/2016 203* 65 - 99 mg/dL Final  . Glucose-Capillary 10/16/2016 105* 65 - 99 mg/dL Final  . Comment 1 10/16/2016 Notify RN   Final  . Glucose-Capillary 10/17/2016 114* 65 - 99 mg/dL Final  . Comment 1 10/17/2016 Notify RN   Final  . Comment 2 10/17/2016 Document in Chart   Final  . Glucose-Capillary 10/17/2016 148* 65 - 99 mg/dL Final  . Comment 1 10/17/2016 Notify RN   Final  . Comment 2 10/17/2016 Document in Chart   Final    RADIOGRAPHIC STUDIES: I have personally reviewed the radiological images as listed and agree with the findings in the report  No results found.  ASSESSMENT/PLAN Chronic leukocytosis Chronic smoking.  PLAN: I will perform a leukocytosis workup and rule out  myeloproliferative disorder and CML with labs as stated below. His leukocytosis may be related to his chronic smoking as well, which is known to cause a neutrophilia. RTC in 2 weeks  to review labs and to discuss the next plan of care.  Orders Placed This Encounter  Procedures  . CBC with Differential    Standing Status:   Future    Standing Expiration Date:   12/13/2017  . Comprehensive metabolic panel    Standing Status:   Future    Standing Expiration Date:   12/13/2017  . JAK2 V617F, w Reflex to CALR/E12/MPL  . BCR-ABL1, CML/ALL, PCR, QUANT    All questions were answered. The patient knows to call the clinic with any problems, questions or concerns.  This note was electronically signed.    Twana First, MD  12/13/2016 8:51 AM

## 2016-12-24 LAB — BCR-ABL1, CML/ALL, PCR, QUANT

## 2016-12-28 ENCOUNTER — Ambulatory Visit (INDEPENDENT_AMBULATORY_CARE_PROVIDER_SITE_OTHER): Payer: 59 | Admitting: Neurology

## 2016-12-28 ENCOUNTER — Encounter: Payer: Self-pay | Admitting: Neurology

## 2016-12-28 VITALS — Ht 68.0 in | Wt 177.0 lb

## 2016-12-28 DIAGNOSIS — R42 Dizziness and giddiness: Secondary | ICD-10-CM | POA: Diagnosis not present

## 2016-12-28 DIAGNOSIS — E1165 Type 2 diabetes mellitus with hyperglycemia: Secondary | ICD-10-CM

## 2016-12-28 DIAGNOSIS — E0842 Diabetes mellitus due to underlying condition with diabetic polyneuropathy: Secondary | ICD-10-CM

## 2016-12-28 DIAGNOSIS — Z9181 History of falling: Secondary | ICD-10-CM | POA: Diagnosis not present

## 2016-12-28 NOTE — Patient Instructions (Addendum)
Unfortunately, dizziness is a very common complaint but is often not due to a primary neurological reason or single underlying medical problem. Often, there a combination of factors, that result in dizziness. This includes blood pressure fluctuations, medication side effects, blood sugar fluctuations, stress, vertigo, poor sleep with sleep deprivation, dehydration, and electrolyte disturbance or other metabolic and endocrinological reasons, meaning hormone related problems such as thyroid dysfunction.  We will investigate things further with a brain MRI. We will call you with the test results. You can consider physical therapy.   Remember to drink plenty of fluid, eat healthy meals and do not skip any meals. Try to eat protein with a every meal and eat a healthy snack such as fruit or nuts in between meals. Try to keep a regular sleep-wake schedule and try to exercise daily, particularly in the form of walking, 20-30 minutes a day, if you can.   As far as your medications are concerned, I would like to suggest no new medications. Please do not skip meals, and please stop smoking. Do not drink sodas and tea due to sugar content.   As far as diagnostic testing: We will do a brain scan, called MRI and call you with the test results. We will have to schedule you for this on a separate date. This test requires authorization from your insurance, and we will take care of the insurance process.   Please talk to Lorin PicketScott Long about seeing an ENT specialist (for ringing on the right side and muffled hearing).  I will see you back as needed at this point, so long as your tests are stable, and reassuring.

## 2016-12-28 NOTE — Progress Notes (Signed)
Subjective:    Patient ID: [name] [name] is a 46 y.o. male.  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 The patient is unaccompanied today. As you know, [name] [name] is a 46 year old right-handed gentleman with an underlying medical history of type 2 diabetes with suboptimal control (A1c 10.9 in May 2018), hypertension, reflux disease, smoking, hyperlipidemia, depression, history of chest pain, low back pain, recent admission to the hospital in May 2018 for diabetic ketoacidosis and overweight state, who reports intermittent dizziness and reports that this is the reason why he was referred but is not completely sure. He feels, that dizzy spells when suddenly changing position, like bending to standing or turning. He had more spells after his D/C and also fell in the kitchen, hit his head, believes he lost consciousness for some seconds, hit the R side of his face. His BP may have been low.  He has intermittent muffled hearing both sides and tinnitus on the right.Has not seen ENT for this. Drinks soda and tea, regular, not diet. Lives with parents. Smokes 1/2 ppd, no EtOH. He endorses intermittent low back pain. He has not seen a spine specialist for this. He tries to drink enough water. He does endorse numbness in his legs.    I have seen him previously once before on 09/03/2014 for sleep apnea evaluation, at which time he was referred by his cardiologist, Dr. Jacinto Halim. He had sleep study testing at the time including a baseline sleep study on 09/10/2014 which showed mild obstructive sleep apnea, severe during REM sleep, moderate during supine sleep. He then return for a CPAP titration study on 09/30/2014 which showed good results with CPAP of 7 cm. He declined CPAP therapy and also did not return for a follow-up appointment at the time. He had a lumbar spine MRI  without contrast on 09/02/2016, this was done at [facility], which I reviewed: IMPRESSION: Tiny focus of stress marrow edema in the left superior endplate of L4. No disc herniation. Otherwise normal MRI of the lumbar spine.   The patient's allergies, current medications, family history, past medical history, past social history, past surgical history and problem list were reviewed and updated as appropriate.   Previously (copied from previous notes for reference):   09/03/2014: [name] [name] is a 46 year old right-handed gentleman with an underlying medical history of hypertension, smoking, hyperlipidemia, poorly controlled type 2 diabetes, depression and atypical chest pain, as well as overweight state, who reports snoring and excessive daytime somnolence. She had a pulse oximetry test overnight on 07/11/2014 which I reviewed: Average oxygen saturation was 92.5%, lowest was 81%, time below 88% saturation was 6.1 minutes. Time below 89% saturation was 15.4 minutes. Total test time was 10 hours and 43 minutes.  I reviewed his recent blood work and test results from your office: on 07/05/14: LDL was elevated at 150, total cholesterol was 220.  He had a Lexiscan stress test on 07/12/14, which I reviewed: NSR, RBBB, negative for ischemia.  SPECT imaging showed normal EF. HbA1c on 06/05/14: 14.3%.  Echocardiogram on 07/09/14: LV normal in size, mild concentric LVH, normal global wall motion. Calculated EF: 58%. Trace MR, trace TR.    His typical bedtime is reported to be around 10 PM and usual wake time is around 5:30 AM. Sleep onset typically occurs within 30-40 minutes. He reports feeling poorly  rested upon awakening. He wakes up on an average 3 to 4 times in the middle of the night and has to go to the bathroom 1 times on a typical night. He reports occasional morning headaches.  He reports excessive daytime somnolence (EDS) and His Epworth Sleepiness Score (ESS) is 5/24 today. He has not fallen asleep while  driving. The patient has not been taking a planned nap.  He has been known to snore for the past few years. Snoring is reportedly mild to moderate, and it is unclear if it is associated with choking sounds or witnessed apneas. He is single, lives with his mother and sleeps alone, except for the dog in the bed. The patient admits to a sense of choking or strangling feeling occasionally. There some report of nighttime reflux, with no nighttime cough experienced. The patient has noted some RLS symptoms and is a restless sleeper, admits to tossing and turning, having nightmares and occasional sleep walking and sleep talking. There is no family history of RLS but sister had OSA on CPAP and died at age 47 from CHF.  He denies cataplexy, sleep paralysis, hypnagogic or hypnopompic hallucinations, or sleep attacks. He does report vivid dreams, nightmares, no dream enactments, but has occasional sleep talking and sleep walking. The patient has not had a sleep study or a home sleep test.  He consumes 4 caffeinated beverages per day, usually in the form of soda: [facility].   His bedroom is usually dark and cool. There is a TV in the bedroom and usually it is not on at night.   His Past Medical History Is Significant For: Past Medical History:  Diagnosis Date  . Atypical chest pain   . Diabetes mellitus without complication (HCC)   . Hyperlipemia   . Hypertension   . Malaise and fatigue     His Past Surgical History Is Significant For: Past Surgical History:  Procedure Laterality Date  . ANKLE SURGERY  1982  . APPENDECTOMY  1975  . CARDIAC CATHETERIZATION N/A 03/28/2015   Procedure: Left Heart Cath and Coronary Angiography;  Surgeon: Yates DecampJay Ganji, MD;  [facility];  Service: Cardiovascular;  Laterality: N/A;  . CARDIAC CATHETERIZATION N/A 03/28/2015   Procedure: Intravascular Pressure Wire/FFR Study;  Surgeon: Yates DecampJay Ganji, MD;  [facility];  Service: Cardiovascular;   Laterality: N/A;    His Family History Is Significant For: Family History  Problem Relation Age of Onset  . Congestive Heart Failure Sister   . Diabetes Mother   . Hypertension Mother   . Diabetes Father   . Hypertension Father     His Social History Is Significant For: Social History   Social History  . Marital status: Single    Spouse name: N/A  . Number of children: 0  . Years of education: 10th g   Occupational History  . [employer]    Social History Main Topics  . Smoking status: Current Every Day Smoker    Packs/day: 1.00    Years: 29.00    Types: Cigarettes  . Smokeless tobacco: Never Used  . Alcohol use No  . Drug use: No  . Sexual activity: Not Asked   Other Topics Concern  . None   Social History Narrative  . None    His Allergies Are:  Allergies  Allergen Reactions  . Omeprazole Magnesium Swelling  . Esomeprazole Other (See Comments)    Facial swelling  :   His Current Medications  Are:  Outpatient Encounter Prescriptions as of 12/28/2016  Medication Sig  . ALPRAZolam (XANAX) 0.5 MG tablet TAKE 1/2 TO 1 TABLET BY MOUTH DAILY AT BEDTIME *MAY TAKE 3 TIMES DAILY AS NEEDED FOR ANXIETY  . aspirin 81 MG chewable tablet Chew 81 mg by mouth at bedtime.   Marland Kitchen atorvastatin (LIPITOR) 40 MG tablet Take 40 mg by mouth daily.   Marland Kitchen buPROPion (WELLBUTRIN SR) 100 MG 12 hr tablet Take 100 mg by mouth 2 (two) times daily.  Marland Kitchen dexlansoprazole (DEXILANT) 60 MG capsule Take 60 mg by mouth daily.  . Empagliflozin-Linagliptin (GLYXAMBI) 10-5 MG TABS Take 1 tablet by mouth every evening.  . Fenofibric Acid 105 MG TABS Take 1 tablet by mouth daily.   Marland Kitchen glimepiride (AMARYL) 4 MG tablet Take 4 mg by mouth daily with breakfast.  . Insulin Degludec (TRESIBA FLEXTOUCH) 200 UNIT/ML SOPN Inject 34 Units into the skin at bedtime.   . metFORMIN (GLUCOPHAGE) 500 MG tablet Take 1 tablet (500 mg total) by mouth 2 (two) times daily with a meal.  . metoCLOPramide (REGLAN) 10 MG  tablet Take 1 tablet (10 mg total) by mouth every 6 (six) hours.  . metoprolol succinate (TOPROL-XL) 50 MG 24 hr tablet Take 50 mg by mouth at bedtime.   . nitroGLYCERIN (NITROSTAT) 0.4 MG SL tablet Place 0.4 mg under the tongue every 5 (five) minutes as needed for chest pain.  . valsartan (DIOVAN) 160 MG tablet Take 160 mg by mouth daily.   . [DISCONTINUED] ondansetron (ZOFRAN ODT) 4 MG disintegrating tablet Take 1 tablet (4 mg total) by mouth every 8 (eight) hours as needed for nausea or vomiting.  . [DISCONTINUED] promethazine (PHENERGAN) 25 MG suppository Place 1 suppository (25 mg total) rectally every 6 (six) hours as needed for nausea or vomiting.  . [DISCONTINUED] promethazine (PHENERGAN) 25 MG tablet Take 1 tablet (25 mg total) by mouth every 6 (six) hours as needed for nausea or vomiting.   No facility-administered encounter medications on file as of 12/28/2016.   :  Review of Systems:  Out of a complete 14 point review of systems, all are reviewed and negative with the exception of these symptoms as listed below: Review of Systems  Neurological:       Pt presents today to discuss his occasional dizziness. Pt is unsure of why he was referred to Dr. Frances Furbish, but believes it is because he feels dizzy at times when he changes positions.    Objective:  Neurological Exam  Physical Exam Physical Examination:   Blood pressure and pulse lying down was 119/76 with a pulse of 86, sitting 118/79 with a pulse of 94, standing 126/83 with a pulse of 92, no significant lightheadedness or vertigo reported upon standing.  General Examination: The patient is a very pleasant 46 y.o. male in no acute distress. He appears mildly deconditioned and adequately groomed.   HEENT: Normocephalic, atraumatic, pupils are equal, round and reactive to light and accommodation. He has corrective eyeglasses. Extraocular tracking is good without limitation to gaze excursion or nystagmus noted. Normal smooth pursuit  is noted. Hearing is grossly intact. Tympanic membranes are clear bilaterally. Face is symmetric with normal facial animation, no bruising noted. Speech is edentulous. There is no lip, neck/head, jaw or voice tremor. Oropharynx exam reveals: mild mouth dryness, edentulous state, moderate airway crowding. Tongue protrudes centrally and palate elevates symmetrically.    Chest: Clear to auscultation without wheezing, rhonchi or crackles noted.  Heart: S1+S2+0, regular and normal without murmurs,  rubs or gallops noted.   Abdomen: Soft, non-tender and non-distended with normal bowel sounds appreciated on auscultation.  Extremities: There is no pitting edema in the distal lower extremities bilaterally.  Skin: Warm and dry without trophic changes noted. There are no varicose veins.  Musculoskeletal: exam reveals no obvious joint deformities, tenderness or joint swelling or erythema, with the exception of slightly wider left ankle.   Neurologically:  Mental status: The patient is awake, alert and oriented in all 4 spheres. His immediate and remote memory, attention, language skills and fund of knowledge are appropriate. There is no evidence of aphasia, agnosia, apraxia or anomia. Speech is clear with normal prosody and enunciation. Thought process is linear. Mood is normal and affect is blunterd.  Cranial nerves II - XII are as described above under HEENT exam. In addition: shoulder shrug is normal with equal shoulder height noted. Motor exam: Normal bulk, strength and tone is noted. There is no drift, tremor or rebound. Romberg is negative, except for mild swaying . Reflexes are 1-2+ throughout. Fine motor skills and coordination: intact with normal finger taps, normal hand movements, normal rapid alternating patting, normal foot taps and normal foot agility.  Cerebellar testing: No dysmetria or intention tremor on finger to nose testing. Heel to shin is unremarkable bilaterally. There is no  truncal or gait ataxia.  Sensory exam: intact to light touch, pinprick, vibration, temperature sense in the upper and decreased to all modalities in the distal lower extremities up to knees.  Gait, station and balance: He stands easily. No veering to one side is noted. No leaning to one side is noted. Posture is age-appropriate and stance is narrow based. Gait shows normal stride length and normal pace. No problems turning are noted. He turns en bloc. Tandem walk is difficult for him.    Assessment and Plan:   In summary, ARACELI COUFAL is a very pleasant 46 year old male with an underlying medical history of type 2 diabetes with suboptimal control (A1c 10.9 in May 2018), hypertension, reflux disease, smoking, hyperlipidemia, depression, history of chest pain, low back pain, recent admission to the hospital in May 2018 for diabetic ketoacidosis and overweight state, who reports intermittent dizziness. He has a nonfocal exam, no significant orthostatic vital signs changes, no vertigo spells. For his history of ringing in his ear on the right and muffled hearing/hearing loss he is encouraged to talk to you about seeing ENT. For intermittent back pain, he may benefit from seeing a spine specialist. He has no focal neurological exam with the exception of evidence of neuropathy which could affect his balance. Optimization of diabetes control is critical for him. I advised the patient to stop smoking and eliminated sugary drinks from his diet. He is advised to try to lose weight. From the neurological standpoint, I suggested we proceed with a brain MRI without contrast. He is encouraged to talk to you about potentially doing physical therapy but ultimately, he will benefit from lifestyle changes.  We talked about smoking cessation and trying to maintain a healthy lifestyle in general, as well as the importance of weight control. I encouraged the patient to eat healthy, exercise daily and keep well hydrated, to  keep a scheduled bedtime and wake time routine, to not skip any meals and eat healthy snacks in between meals.  We will call him with his brain MRI results. I will see him back on an as-needed basis.  Thank you very much for allowing me to participate in the  care of this nice patient. If I can be of any further assistance to you please do not hesitate to call me at [phone].  Sincerely,   Huston FoleySaima Marq Rebello, MD, PhD

## 2016-12-29 ENCOUNTER — Encounter (HOSPITAL_COMMUNITY): Payer: Self-pay

## 2016-12-29 ENCOUNTER — Telehealth: Payer: Self-pay | Admitting: Neurology

## 2016-12-29 ENCOUNTER — Encounter (HOSPITAL_COMMUNITY): Payer: 59 | Attending: Oncology | Admitting: Oncology

## 2016-12-29 VITALS — BP 127/79 | HR 89 | Temp 98.9°F | Resp 18 | Wt 178.9 lb

## 2016-12-29 DIAGNOSIS — Z9889 Other specified postprocedural states: Secondary | ICD-10-CM | POA: Insufficient documentation

## 2016-12-29 DIAGNOSIS — Z7982 Long term (current) use of aspirin: Secondary | ICD-10-CM | POA: Insufficient documentation

## 2016-12-29 DIAGNOSIS — Z720 Tobacco use: Secondary | ICD-10-CM | POA: Diagnosis not present

## 2016-12-29 DIAGNOSIS — Z79899 Other long term (current) drug therapy: Secondary | ICD-10-CM | POA: Insufficient documentation

## 2016-12-29 DIAGNOSIS — D72829 Elevated white blood cell count, unspecified: Secondary | ICD-10-CM | POA: Insufficient documentation

## 2016-12-29 DIAGNOSIS — F17210 Nicotine dependence, cigarettes, uncomplicated: Secondary | ICD-10-CM | POA: Insufficient documentation

## 2016-12-29 DIAGNOSIS — E119 Type 2 diabetes mellitus without complications: Secondary | ICD-10-CM | POA: Insufficient documentation

## 2016-12-29 DIAGNOSIS — Z888 Allergy status to other drugs, medicaments and biological substances status: Secondary | ICD-10-CM | POA: Insufficient documentation

## 2016-12-29 DIAGNOSIS — Z833 Family history of diabetes mellitus: Secondary | ICD-10-CM | POA: Insufficient documentation

## 2016-12-29 DIAGNOSIS — Z8249 Family history of ischemic heart disease and other diseases of the circulatory system: Secondary | ICD-10-CM | POA: Insufficient documentation

## 2016-12-29 DIAGNOSIS — Z7984 Long term (current) use of oral hypoglycemic drugs: Secondary | ICD-10-CM | POA: Insufficient documentation

## 2016-12-29 DIAGNOSIS — E785 Hyperlipidemia, unspecified: Secondary | ICD-10-CM | POA: Insufficient documentation

## 2016-12-29 DIAGNOSIS — I10 Essential (primary) hypertension: Secondary | ICD-10-CM | POA: Insufficient documentation

## 2016-12-29 NOTE — Telephone Encounter (Signed)
[facility]

## 2016-12-29 NOTE — Telephone Encounter (Signed)
Patient returning a call to schedule an MRI. °

## 2016-12-29 NOTE — Progress Notes (Signed)
[facility] Initial Visit:  Patient Care Team: Shanon Rosser, PA-C as PCP - General (Physician Assistant) Adrian Prows, MD as Consulting Physician (Cardiology)  CHIEF COMPLAINTS/PURPOSE OF CONSULTATION:  Leukocytosis  HISTORY OF PRESENTING ILLNESS: [name] [name] 46 y.o. male is here because of Leukocytosis. Patient states he has had leukocytosis for about a year. He does have chronic sinus infections. His last CBC from 10/15/16 demonstrated WBC 19.3 K, hemoglobin 18.4 g/dl, hematocrit 52.7%, platelet count 238K. No differential was available. Review of his CBCs dating back to March 2017 demonstrated that his WBC has ranged between 12K the 27K. patient states that in May 2018 he did have a sinus infection as well as dental issues for which he was taking antibiotics. He states that currently he has chronic allergies but otherwise is not having an infection. He states he has occasional shortness breath. Denies any chest pain, abdominal pain, focal weakness. He states he has night sweats as well as fluctuating weight. Denies any unexplained fevers or chills. He is a chronic smoker and has smoked 1 pack per day for the past 32 years. He denies any chronic steroid use.  INTERVAL HISTORY: Patient presented back today for follow-up of leukocytosis. Repeat CBC on 12/13/16 of a treated WBC 11.6 K, hemoglobin 15.5 g/dL, hematocrit 44.8%, platelet count 214K. BCR-ABL was not detected. JAK2 's pending at this time. Patient states that he has been doing well. He's been cutting down on his cigarette smoking to half a pack per day. He denies any recent infections state that his previous infection has resolved. Denies any chest pain, shortness breath, abdominal pain, focal weakness.  Review of Systems  Constitutional: Negative for chills, fever and unexpected weight change.  HENT:  Negative.   Eyes: Negative.   Respiratory: Negative for chest tightness, cough and shortness of breath.    Cardiovascular: Negative.   Gastrointestinal: Negative.   Endocrine: Negative.   Genitourinary: Negative.    Musculoskeletal: Negative.   Skin: Negative.   Neurological: Negative.   Hematological: Negative for adenopathy. Does not bruise/bleed easily.  Psychiatric/Behavioral: Negative.     MEDICAL HISTORY: Past Medical History:  Diagnosis Date  . Atypical chest pain   . Diabetes mellitus without complication ([place])   . Hyperlipemia   . Hypertension   . Malaise and fatigue     SURGICAL HISTORY: Past Surgical History:  Procedure Laterality Date  . [place]  . APPENDECTOMY  1975  . CARDIAC CATHETERIZATION N/A 03/28/2015   Procedure: Left Heart Cath and Coronary Angiography;  Surgeon: Adrian Prows, MD;  Location: [facility];  Service: Cardiovascular;  Laterality: N/A;  . CARDIAC CATHETERIZATION N/A 03/28/2015   Procedure: Intravascular Pressure Wire/FFR Study;  Surgeon: Adrian Prows, MD;  Location: [facility];  Service: Cardiovascular;  Laterality: N/A;    SOCIAL HISTORY: Social History   Social History  . Marital status: Single    Spouse name: N/A  . Number of children: 0  . Years of education: 10th g   Occupational History  . [employer]    Social History Main Topics  . Smoking status: Current Every Day Smoker    Packs/day: 1.00    Years: 29.00    Types: Cigarettes  . Smokeless tobacco: Never Used  . Alcohol use No  . Drug use: No  . Sexual activity: Not on file   Other Topics Concern  . Not on file   Social History Narrative  . No  narrative on file    FAMILY HISTORY Family History  Problem Relation Age of Onset  . Congestive Heart Failure Sister   . Diabetes Mother   . Hypertension Mother   . Diabetes Father   . Hypertension Father     ALLERGIES:  is allergic to omeprazole magnesium and esomeprazole.  MEDICATIONS:  Current Outpatient Prescriptions  Medication Sig Dispense Refill  . ALPRAZolam (XANAX) 0.5 MG  tablet TAKE 1/2 TO 1 TABLET BY MOUTH DAILY AT BEDTIME *MAY TAKE 3 TIMES DAILY AS NEEDED FOR ANXIETY  1  . aspirin 81 MG chewable tablet Chew 81 mg by mouth at bedtime.     Marland Kitchen atorvastatin (LIPITOR) 40 MG tablet Take 40 mg by mouth daily.     Marland Kitchen buPROPion (WELLBUTRIN SR) 100 MG 12 hr tablet Take 100 mg by mouth 2 (two) times daily.    Marland Kitchen dexlansoprazole (DEXILANT) 60 MG capsule Take 60 mg by mouth daily.    . Empagliflozin-Linagliptin (GLYXAMBI) 10-5 MG TABS Take 1 tablet by mouth every evening.    . Fenofibric Acid 105 MG TABS Take 1 tablet by mouth daily.     Marland Kitchen glimepiride (AMARYL) 4 MG tablet Take 4 mg by mouth daily with breakfast.    . Insulin Degludec (TRESIBA FLEXTOUCH) 200 UNIT/ML SOPN Inject 34 Units into the skin at bedtime.     . metFORMIN (GLUCOPHAGE) 500 MG tablet Take 1 tablet (500 mg total) by mouth 2 (two) times daily with a meal.    . metoCLOPramide (REGLAN) 10 MG tablet Take 1 tablet (10 mg total) by mouth every 6 (six) hours. 120 tablet 0  . metoprolol succinate (TOPROL-XL) 50 MG 24 hr tablet Take 50 mg by mouth at bedtime.   2  . nitroGLYCERIN (NITROSTAT) 0.4 MG SL tablet Place 0.4 mg under the tongue every 5 (five) minutes as needed for chest pain.    . valsartan (DIOVAN) 160 MG tablet Take 160 mg by mouth daily.      No current facility-administered medications for this visit.     PHYSICAL EXAMINATION:  ECOG PERFORMANCE STATUS: 0 - Asymptomatic   Vitals:   12/29/16 1405  BP: 127/79  Pulse: 89  Resp: 18  Temp: 98.9 F (37.2 C)    Filed Weights   12/29/16 1405  Weight: 178 lb 14.4 oz (81.1 kg)     Physical Exam  Constitutional: He is oriented to person, place, and time and well-developed, well-nourished, and in no distress. No distress.  HENT:  Head: Normocephalic and atraumatic.  Mouth/Throat: No oropharyngeal exudate.  Eyes: Pupils are equal, round, and reactive to light. Conjunctivae are normal. No scleral icterus.  Neck: Normal range of motion. Neck  supple. No JVD present.  Cardiovascular: Normal rate, regular rhythm and normal heart sounds.  Exam reveals no gallop and no friction rub.   No murmur heard. Pulmonary/Chest: Breath sounds normal. No respiratory distress. He has no wheezes. He has no rales.  Abdominal: Soft. Bowel sounds are normal. He exhibits no distension. There is no tenderness. There is no guarding.  Musculoskeletal: He exhibits no edema or tenderness.  Lymphadenopathy:    He has no cervical adenopathy.  Neurological: He is alert and oriented to person, place, and time. No cranial nerve deficit.  Skin: Skin is warm and dry. No rash noted. No erythema. No pallor.  Psychiatric: Affect and judgment normal.     LABORATORY DATA: I have personally reviewed the data as listed:  Appointment on 12/13/2016  Component Date Value  Ref Range Status  . WBC 12/13/2016 11.6* 4.0 - 10.5 K/uL Final  . RBC 12/13/2016 4.86  4.22 - 5.81 MIL/uL Final  . Hemoglobin 12/13/2016 15.5  13.0 - 17.0 g/dL Final  . HCT 12/13/2016 44.8  39.0 - 52.0 % Final  . MCV 12/13/2016 92.2  78.0 - 100.0 fL Final  . MCH 12/13/2016 31.9  26.0 - 34.0 pg Final  . MCHC 12/13/2016 34.6  30.0 - 36.0 g/dL Final  . RDW 12/13/2016 12.9  11.5 - 15.5 % Final  . Platelets 12/13/2016 214  150 - 400 K/uL Final  . Neutrophils Relative % 12/13/2016 65  % Final  . Neutro Abs 12/13/2016 7.5  1.7 - 7.7 K/uL Final  . Lymphocytes Relative 12/13/2016 27  % Final  . Lymphs Abs 12/13/2016 3.1  0.7 - 4.0 K/uL Final  . Monocytes Relative 12/13/2016 5  % Final  . Monocytes Absolute 12/13/2016 0.6  0.1 - 1.0 K/uL Final  . Eosinophils Relative 12/13/2016 3  % Final  . Eosinophils Absolute 12/13/2016 0.3  0.0 - 0.7 K/uL Final  . Basophils Relative 12/13/2016 0  % Final  . Basophils Absolute 12/13/2016 0.1  0.0 - 0.1 K/uL Final  . Sodium 12/13/2016 145  135 - 145 mmol/L Final  . Potassium 12/13/2016 4.7  3.5 - 5.1 mmol/L Final  . Chloride 12/13/2016 109  101 - 111 mmol/L Final   . CO2 12/13/2016 26  22 - 32 mmol/L Final  . Glucose, Bld 12/13/2016 367* 65 - 99 mg/dL Final  . BUN 12/13/2016 12  6 - 20 mg/dL Final  . Creatinine, Ser 12/13/2016 0.88  0.61 - 1.24 mg/dL Final  . Calcium 12/13/2016 9.5  8.9 - 10.3 mg/dL Final  . Total Protein 12/13/2016 6.9  6.5 - 8.1 g/dL Final  . Albumin 12/13/2016 3.7  3.5 - 5.0 g/dL Final  . AST 12/13/2016 15  15 - 41 U/L Final  . ALT 12/13/2016 15* 17 - 63 U/L Final  . Alkaline Phosphatase 12/13/2016 98  38 - 126 U/L Final  . Total Bilirubin 12/13/2016 0.7  0.3 - 1.2 mg/dL Final  . GFR calc non Af Amer 12/13/2016 >60  >60 mL/min Final  . GFR calc Af Amer 12/13/2016 >60  >60 mL/min Final   Comment: (NOTE) The eGFR has been calculated using the CKD EPI equation. This calculation has not been validated in all clinical situations. eGFR's persistently <60 mL/min signify possible Chronic Kidney Disease.   . Anion gap 12/13/2016 10  5 - 15 Final  Office Visit on 12/13/2016  Component Date Value Ref Range Status  . b2a2 transcript 12/13/2016 Comment  % Final   Comment: (NOTE)           <0.0032 % (sensitivity limit of assay)   . b3a2 transcript 12/13/2016 Comment  % Final   Comment: (NOTE)           <0.0032 % (sensitivity limit of assay)   . E1A2 Transcript 12/13/2016 Comment  % Final   Comment: (NOTE)           <0.0032 % (sensitivity limit of assay)   . Interpretation (BCRAL): 12/13/2016 Comment   Final   Comment: (NOTE) NEGATIVE for the BCR-ABL1 e1a2 (p190), e13a2 (b2a2, p210) and e14a2 (b3a2, p210) fusion transcripts. These results do not rule out the presence of rare BCR-ABL1 transcripts not detected by this assay.   . Director Review [facility]): 12/13/2016 Comment   Final   Comment: (NOTE) Katina Degree, MD,  PhD Director, [facility] [facility] [place], [place] [address] [phone]   . Background: 12/13/2016 Comment   Corrected   Comment: (NOTE) This assay  can detect three different types of BCR-ABL1 fusion transcripts associated with CML, ALL, and AML: e13a2 (previously b2a2) and e14a2 (previously b3a2) (major breakpoint, p210), as well as e1a2 (minor breakpoint, p190). The e13a2 and e14a2 transcript values are titrated to the current International Scale (IS). The standardized baseline is 100% BCR-ABL1 (IS) and major molecular response (MMR) is equivalent to 0.1% BCR-ABL1 (IS) corresponding to a 3-log reduction. Results should be correlated with appropriate clinical and laboratory information as indicated.   . Methodology 12/13/2016 Comment   Corrected   Comment: (NOTE) Total RNA is isolated from the sample and subject to a real-time, reverse transcriptase polymerase chain reaction (RT-PCR). The PCR primers and probes are specific for BCR-ABL1 e13a2, e14a2 and e1a2 fusion transcripts. The ABL1 transcript is amplified as the control for cDNA quantity and quality. Serial dilutions of a validated positive control RNA with known t(9;22) BCR-ABL1 are used as reference for quantification of BCR-ABL1 relative to ABL1. The numeric BCR-ABL1 level is reportd as % BCR-ABL1/ABL1 and the detection sensitivity is 4.5 log below the standard baseline. This test was developed and its performance characteristics determined by [facility]. It has not been cleared or approved by the Food and Drug Administration. References:    1. Anastasia Fiedler and Branford S: Seminars in Hematology 2003;       40 (suppl2):62-68.    2. White HE, et al. Blood 2010; 116: e111-117.    3. NCCN Clinical Practice Guidelines in Oncology, Chronic       Myeloid Leukemia. V2. 2017.                           Performed At: [facility] [address] [place], [place] [id number] Nechama Guard MD YW:[id number] Performed At: [facility] [place], [place] [id number] Nechama Guard MD [id number]     RADIOGRAPHIC STUDIES: I have personally reviewed the  radiological images as listed and agree with the findings in the report  No results found.  ASSESSMENT/PLAN Chronic leukocytosis- improved. Chronic smoking.  PLAN: Patient's leukocytosis has improved. His elevated leukocytosis in May was likely infectious related to his sinus infection. His chronic smoking may be contributing to a mild leukocytosis. I have advised him to continue efforts for cutting down on cigarette smoking.  BCR-ABL was negative for CML. JAK2 is pending at this time we'll call patient with the results when he comes back. Return to clinic in 6 months for follow-up with a CBC.  Orders Placed This Encounter  Procedures  . CBC with Differential    Standing Status:   Future    Standing Expiration Date:   12/29/2017    All questions were answered. The patient knows to call the clinic with any problems, questions or concerns.  This note was electronically signed.    Twana First, MD  12/29/2016 3:50 PM

## 2017-01-03 LAB — CALR + JAK2 E12-15 + MPL (REFLEXED)

## 2017-01-03 LAB — JAK2 V617F, W REFLEX TO CALR/E12/MPL

## 2017-01-05 ENCOUNTER — Ambulatory Visit (INDEPENDENT_AMBULATORY_CARE_PROVIDER_SITE_OTHER): Payer: 59

## 2017-01-05 DIAGNOSIS — Z9181 History of falling: Secondary | ICD-10-CM

## 2017-01-05 DIAGNOSIS — E1165 Type 2 diabetes mellitus with hyperglycemia: Secondary | ICD-10-CM

## 2017-01-05 DIAGNOSIS — R42 Dizziness and giddiness: Secondary | ICD-10-CM | POA: Diagnosis not present

## 2017-01-05 DIAGNOSIS — E0842 Diabetes mellitus due to underlying condition with diabetic polyneuropathy: Secondary | ICD-10-CM

## 2017-01-06 ENCOUNTER — Ambulatory Visit: Payer: 59 | Admitting: Neurology

## 2017-01-11 NOTE — Progress Notes (Signed)
Please call and advise the patient that the recent scan we did was within normal limits. We did a brain MRI wo contrast which showed normal for age findings. In particular, there were no acute findings, such as a stroke, or mass or blood products. No further action is required on this test at this time. Please remind patient to keep any upcoming appointments or tests and to call us with any interim questions, concerns, problems or updates. Thanks,  Huston FoleySaima Montana Fassnacht, MD, PhD

## 2017-01-12 ENCOUNTER — Telehealth: Payer: Self-pay | Admitting: Neurology

## 2017-01-12 NOTE — Telephone Encounter (Signed)
-----   Message from Huston FoleySaima Athar, MD sent at 01/11/2017  5:28 PM EDT ----- Please call and advise the patient that the recent scan we did was within normal limits. We did a brain MRI wo contrast which showed normal for age findings. In particular, there were no acute findings, such as a stroke, or mass or blood products. No further action is required on this test at this time. Please remind patient to keep any upcoming appointments or tests and to call us with any interim questions, concerns, problems or updates. Thanks,  Huston FoleySaima Athar, MD, PhD

## 2017-01-12 NOTE — Telephone Encounter (Signed)
Called patient with MRI results. Informed the patient of the MRI results and that they were in WNL. Pt still complaining of having dizziness. I did state there was no clear reason based off MRI to understand what could be causing it. Encouraged patient to either schedule a follow up apt with us or his primary doctor to discuss the dizziness he is still having. Pt verbalized understanding and had no further questions at this time.

## 2017-02-14 DIAGNOSIS — I4510 Unspecified right bundle-branch block: Secondary | ICD-10-CM | POA: Insufficient documentation

## 2017-03-14 ENCOUNTER — Other Ambulatory Visit: Payer: Self-pay | Admitting: Surgery

## 2017-03-21 NOTE — Patient Instructions (Addendum)
[name] [name]  03/21/2017   Your procedure is scheduled on: 03-24-17   Report to [facility] Main  Entrance Take [place] Elevators to 3rd floor to  Short Stay Center at 7:45 AM.   Call this number if you have problems the morning of surgery [phone]   Remember: ONLY 1 PERSON MAY GO WITH YOU TO SHORT STAY TO GET  READY MORNING OF YOUR SURGERY.  Do not eat food or drink liquids :After Midnight.     Take these medicines the morning of surgery with A SIP OF WATER: Atorvastatin (Lipitor), Bupropion (Wellbutrin) and Dexlansoprazole (Deilant) DO NOT TAKE ANY DIABETIC MEDICATIONS DAY OF YOUR SURGERY                               You may not have any metal on your body including hair pins and              piercings  Do not wear jewelry, lotions, powders or perfumes, deodorant             Men may shave face and neck.   Do not bring valuables to the hospital. [facility] IS NOT             RESPONSIBLE   FOR VALUABLES.  Contacts, dentures or bridgework may not be worn into surgery.      Patients discharged the day of surgery will not be allowed to drive home.  Name and phone number of your driver: [name] [phone]                Please read over the following fact sheets you were given: _____________________________________________________________________             How to Manage Your Diabetes Before and After Surgery  Why is it important to control my blood sugar before and after surgery? . Improving blood sugar levels before and after surgery helps healing and can limit problems. . A way of improving blood sugar control is eating a healthy diet by: o  Eating less sugar and carbohydrates o  Increasing activity/exercise o  Talking with your doctor about reaching your blood sugar goals . High blood sugars (greater than 180 mg/dL) can raise your risk of infections and slow your recovery, so you will need to focus on controlling your diabetes during the  weeks before surgery. . Make sure that the doctor who takes care of your diabetes knows about your planned surgery including the date and location.  How do I manage my blood sugar before surgery? . Check your blood sugar at least 4 times a day, starting 2 days before surgery, to make sure that the level is not too high or low. o Check your blood sugar the morning of your surgery when you wake up and every 2 hours until you get to the Short Stay unit. . If your blood sugar is less than 70 mg/dL, you will need to treat for low blood sugar: o Do not take insulin. o Treat a low blood sugar (less than 70 mg/dL) with  cup of clear juice (cranberry or apple), 4 glucose tablets, OR glucose gel. o Recheck blood sugar in 15 minutes after treatment (to make sure it is greater than 70 mg/dL). If your blood sugar is not greater than 70 mg/dL on recheck, call [phone] for further instructions. Marland Kitchen  Report your blood sugar to the short stay nurse when you get to Short Stay.  . If you are admitted to the hospital after surgery: o Your blood sugar will be checked by the staff and you will probably be given insulin after surgery (instead of oral diabetes medicines) to make sure you have good blood sugar levels. o The goal for blood sugar control after surgery is 80-180 mg/dL.   WHAT DO I DO ABOUT MY DIABETES MEDICATION?  Marland Kitchen. Do not take oral diabetes medicines (pills) the morning of surgery.  . THE DAY BEFORE SURGERY, take your usual dose of Glimepiride ( Amaryl ). Your usual dose of Metformin ( Glucophage). Take none of these the day of surgery.   . The Night before surgery take only 20 U of Tresiba Insulin         Patient Signature:  Date:   Nurse Signature:  Date:   Reviewed and Endorsed by [facility] Patient Education Committee, August 2015[facility] - Preparing for Surgery Before surgery, you can play an important role.  Because skin is not sterile, your skin needs to be as free of germs as  possible.  You can reduce the number of germs on your skin by washing with CHG (chlorahexidine gluconate) soap before surgery.  CHG is an antiseptic cleaner which kills germs and bonds with the skin to continue killing germs even after washing. Please DO NOT use if you have an allergy to CHG or antibacterial soaps.  If your skin becomes reddened/irritated stop using the CHG and inform your nurse when you arrive at Short Stay. Do not shave (including legs and underarms) for at least 48 hours prior to the first CHG shower.  You may shave your face/neck. Please follow these instructions carefully:  1.  Shower with CHG Soap the night before surgery and the  morning of Surgery.  2.  If you choose to wash your hair, wash your hair first as usual with your  normal  shampoo.  3.  After you shampoo, rinse your hair and body thoroughly to remove the  shampoo.                           4.  Use CHG as you would any other liquid soap.  You can apply chg directly  to the skin and wash                       Gently with a scrungie or clean washcloth.  5.  Apply the CHG Soap to your body ONLY FROM THE NECK DOWN.   Do not use on face/ open                           Wound or open sores. Avoid contact with eyes, ears mouth and genitals (private parts).                       Wash face,  Genitals (private parts) with your normal soap.             6.  Wash thoroughly, paying special attention to the area where your surgery  will be performed.  7.  Thoroughly rinse your body with warm water from the neck down.  8.  DO NOT shower/wash with your normal soap after using and rinsing off  the CHG Soap.  9.  Pat yourself dry with a clean towel.            10.  Wear clean pajamas.            11.  Place clean sheets on your bed the night of your first shower and do not  sleep with pets. Day of Surgery : Do not apply any lotions/deodorants the morning of surgery.  Please wear clean clothes to the hospital/surgery  center.  FAILURE TO FOLLOW THESE INSTRUCTIONS MAY RESULT IN THE CANCELLATION OF YOUR SURGERY PATIENT SIGNATURE_________________________________  NURSE SIGNATURE__________________________________  ________________________________________________________________________

## 2017-03-21 NOTE — Progress Notes (Addendum)
03-07-17 EKG on chart  08-15-15 LOV from Dr. Jacinto HalimGanji on chart  03-28-15 [facility]) Cardiac Cath    07-12-14  Stress Test on Chart   07-09-14 ECHO on chart  Above information reviewed with Dr. Okey Dupreose, Anesthesiologist . Per Dr. Okey Dupreose, patient can proceed with surgery.

## 2017-03-22 ENCOUNTER — Encounter (HOSPITAL_COMMUNITY)
Admission: RE | Admit: 2017-03-22 | Discharge: 2017-03-22 | Disposition: A | Discharge status: Home / Self Care | Payer: 59 | Source: Ambulatory Visit | Attending: Surgery | Admitting: Surgery

## 2017-03-22 ENCOUNTER — Encounter (HOSPITAL_COMMUNITY): Payer: Self-pay

## 2017-03-22 DIAGNOSIS — F419 Anxiety disorder, unspecified: Secondary | ICD-10-CM | POA: Diagnosis not present

## 2017-03-22 DIAGNOSIS — Z79899 Other long term (current) drug therapy: Secondary | ICD-10-CM | POA: Diagnosis not present

## 2017-03-22 DIAGNOSIS — Z820 Family history of epilepsy and other diseases of the nervous system: Secondary | ICD-10-CM | POA: Diagnosis not present

## 2017-03-22 DIAGNOSIS — Z833 Family history of diabetes mellitus: Secondary | ICD-10-CM | POA: Diagnosis not present

## 2017-03-22 DIAGNOSIS — Z87891 Personal history of nicotine dependence: Secondary | ICD-10-CM | POA: Diagnosis not present

## 2017-03-22 DIAGNOSIS — L0501 Pilonidal cyst with abscess: Secondary | ICD-10-CM | POA: Diagnosis present

## 2017-03-22 DIAGNOSIS — Z888 Allergy status to other drugs, medicaments and biological substances status: Secondary | ICD-10-CM | POA: Diagnosis not present

## 2017-03-22 DIAGNOSIS — I10 Essential (primary) hypertension: Secondary | ICD-10-CM | POA: Diagnosis not present

## 2017-03-22 DIAGNOSIS — Z8379 Family history of other diseases of the digestive system: Secondary | ICD-10-CM | POA: Diagnosis not present

## 2017-03-22 DIAGNOSIS — Z8261 Family history of arthritis: Secondary | ICD-10-CM | POA: Diagnosis not present

## 2017-03-22 DIAGNOSIS — Z836 Family history of other diseases of the respiratory system: Secondary | ICD-10-CM | POA: Diagnosis not present

## 2017-03-22 DIAGNOSIS — Z818 Family history of other mental and behavioral disorders: Secondary | ICD-10-CM | POA: Diagnosis not present

## 2017-03-22 DIAGNOSIS — Z8249 Family history of ischemic heart disease and other diseases of the circulatory system: Secondary | ICD-10-CM | POA: Diagnosis not present

## 2017-03-22 DIAGNOSIS — Z7982 Long term (current) use of aspirin: Secondary | ICD-10-CM | POA: Diagnosis not present

## 2017-03-22 LAB — BASIC METABOLIC PANEL
ANION GAP: 10 (ref 5–15)
BUN: 19 mg/dL (ref 6–20)
CALCIUM: 9.1 mg/dL (ref 8.9–10.3)
CO2: 22 mmol/L (ref 22–32)
CREATININE: 0.93 mg/dL (ref 0.61–1.24)
Chloride: 107 mmol/L (ref 101–111)
GFR calc Af Amer: >60 mL/min (ref >60)
GLUCOSE: 72 mg/dL (ref 65–99)
Potassium: 4.4 mmol/L (ref 3.5–5.1)
Sodium: 139 mmol/L (ref 135–145)

## 2017-03-22 LAB — CBC
HCT: 39.6 % (ref 39.0–52.0)
Hemoglobin: 13.7 g/dL (ref 13.0–17.0)
MCH: 31.8 pg (ref 26.0–34.0)
MCHC: 34.6 g/dL (ref 30.0–36.0)
MCV: 91.9 fL (ref 78.0–100.0)
PLATELETS: 286 10*3/uL (ref 150–400)
RBC: 4.31 MIL/uL (ref 4.22–5.81)
RDW: 13.1 % (ref 11.5–15.5)
WBC: 16.2 10*3/uL — AB (ref 4.0–10.5)

## 2017-03-22 LAB — GLUCOSE, CAPILLARY: Glucose-Capillary: 100 mg/dL — ABNORMAL HIGH (ref 65–99)

## 2017-03-22 NOTE — Progress Notes (Addendum)
03-22-17 CBC and HGA1C results routed to Dr. [name] Miyamotoouglas Blackman via [facility].

## 2017-03-23 LAB — HEMOGLOBIN A1C
HEMOGLOBIN A1C: 11.2 % — AB (ref 4.8–5.6)
MEAN PLASMA GLUCOSE: 275 mg/dL

## 2017-03-23 NOTE — H&P (Signed)
[name] [name] 03/14/2017 3:19 PM Location: [facility] Patient #: [id number] DOB: 1970/06/25 Single / Language: [place] / Race: White Male   History of Present Illness ([name] [name] A. Magnus Ivan MD; 03/14/2017 3:31 PM) The patient is a 46 year old male who presents with a pilonidal cyst. 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 The patient reports he has had for about a year. It will occasionally spontaneously drain but he has also had had an incision and drainage performed twice. He has just finished a course of antibiotics. He is otherwise without complaint. He is a long-term smoker and does have diabetes.   Past Surgical History [name] [name], CMA; 03/14/2017 3:19 PM) Foot Surgery  Bilateral.  Diagnostic Studies History [name] [name], CMA; 03/14/2017 3:19 PM) Colonoscopy  within last year  Allergies [name] [name], CMA; 03/14/2017 3:21 PM) Omeprazole *ULCER DRUGS*  Esomeprazole Magnesium *ULCER DRUGS*   Medication History ([name] [name], CMA; 03/14/2017 3:24 PM) ALPRAZolam (0.5MG  Tablet, Oral) Active. Aspirin (81MG  Tablet, Oral) Active. Atorvastatin Calcium (40MG  Tablet, Oral) Active. BuPROPion HCl ER (SR) (100MG  Tablet ER 12HR, Oral) Active. Dexilant (60MG  Capsule DR, Oral) Active. Glyxambi (10-5MG  Tablet, Oral) Active. Fenofibric Acid (105MG  Tablet, Oral) Active. Glimepiride (4MG  Tablet, Oral) Active. Evaristo Bury FlexTouch (200UNIT/ML Soln Pen-inj, Subcutaneous) Active. MetFORMIN HCl (500MG  Tablet, Oral) Active. Reglan (10MG  Tablet, Oral) Active. Sertraline HCl (100MG  Tablet, Oral) Active. Metoprolol Succinate ER (50MG  Tablet ER 24HR, Oral) Active. Medications Reconciled  Social History [name] [name], CMA; 03/14/2017 3:19 PM) Alcohol use  Occasional alcohol use. Caffeine use  Carbonated beverages, Tea. No drug use  Tobacco use  Former smoker.  Family History [name] [name], CMA;  03/14/2017 3:19 PM) Arthritis  Brother, Father, Mother. Depression  Brother, Father, Mother. Diabetes Mellitus  Brother, Father. Hypertension  Brother, Father, Mother.  Other Problems [name] [name], CMA; 03/14/2017 3:19 PM) Anxiety Disorder  Arthritis  Back Pain  Chest pain  Depression  Diabetes Mellitus  Gastroesophageal Reflux Disease  Hemorrhoids  High blood pressure  Hypercholesterolemia  Inguinal Hernia  Migraine Headache  Sleep Apnea     Review of Systems ([name] [name] CMA; 03/14/2017 3:19 PM) General Present- Night Sweats. Not Present- Appetite Loss, Chills, Fatigue, Fever, Weight Gain and Weight Loss. Skin Present- Non-Healing Wounds. Not Present- Change in Wart/Mole, Dryness, Hives, Jaundice, New Lesions, Rash and Ulcer. HEENT Present- Hearing Loss, Seasonal Allergies and Wears glasses/contact lenses. Not Present- Earache, Hoarseness, Nose Bleed, Oral Ulcers, Ringing in the Ears, Sinus Pain, Sore Throat, Visual Disturbances and Yellow Eyes. Respiratory Present- Difficulty Breathing and Snoring. Not Present- Bloody sputum, Chronic Cough and Wheezing. Breast Not Present- Breast Mass, Breast Pain, Nipple Discharge and Skin Changes. Cardiovascular Present- Chest Pain, Leg Cramps, Shortness of Breath and Swelling of Extremities. Not Present- Difficulty Breathing Lying Down, Palpitations and Rapid Heart Rate. Gastrointestinal Present- Abdominal Pain, Bloating, Constipation, Excessive gas, Hemorrhoids, Indigestion, Nausea and Vomiting. Not Present- Bloody Stool, Change in Bowel Habits, Chronic diarrhea, Difficulty Swallowing, Gets full quickly at meals and Rectal Pain. Male Genitourinary Not Present- Blood in Urine, Change in Urinary Stream, Frequency, Impotence, Nocturia, Painful Urination, Urgency and Urine Leakage. Musculoskeletal Present- Back Pain, Joint Pain, Joint Stiffness, Muscle Pain, Muscle Weakness and Swelling of Extremities. Neurological Present-  Headaches, Numbness, Tingling and Weakness. Not Present- Decreased Memory, Fainting, Seizures, Tremor and Trouble walking. Psychiatric Present- Anxiety, Change in Sleep Pattern, Depression and Frequent crying. Not Present- Bipolar and Fearful. Endocrine Present- Hot flashes. Not Present- Cold Intolerance, Excessive Hunger, Hair Changes,  Heat Intolerance and New Diabetes.  Vitals ([name] [name] CMA; 03/14/2017 3:20 PM) 03/14/2017 3:20 PM Weight: 187.2 lb Height: 68in Body Surface Area: 1.99 m Body Mass Index: 28.46 kg/m  Temp.: 99.12F(Oral)  Pulse: 93 (Regular)        Physical Exam ([name] Sproule A. Magnus IvanBlackman MD; 03/14/2017 3:32 PM) The physical exam findings are as follows: Note:Generally, he is well appearance Lungs are clear to auscultation bilaterally with normal respiratory effort Cardiovascular is regular rate and rhythm At the gluteal cleft, there is an abscess which is healing just to the left of the midline. There are multiple ingrown hairs at the gluteal cleft consistent with a pilonidal abscess/cyst    Assessment & Plan ([name] Cabanilla A. Magnus IvanBlackman MD; 03/14/2017 3:33 PM) PILONIDAL ABSCESS (L05.01) Impression: We discussed the diagnosis in detail. He is interested in definitive surgery. I discussed the surgical procedure in detail. I reported him that I would close the wound there was a significant risk of the wound opening up in him having a chronic open wound that wouldn't require wet-to-dry dressing changes. We discussed the risks of bleeding, infection, recurrence, postoperative recovery, etc. I encouraged him to quit smoking. He understands and wished to proceed with surgery which will be scheduled

## 2017-03-24 ENCOUNTER — Ambulatory Visit (HOSPITAL_COMMUNITY): Payer: 59 | Admitting: Certified Registered Nurse Anesthetist

## 2017-03-24 ENCOUNTER — Encounter (HOSPITAL_COMMUNITY)
Admission: RE | Disposition: A | Discharge status: Home / Self Care | Payer: Self-pay | Source: Ambulatory Visit | Attending: Surgery

## 2017-03-24 ENCOUNTER — Encounter (HOSPITAL_COMMUNITY): Payer: Self-pay | Admitting: *Deleted

## 2017-03-24 ENCOUNTER — Ambulatory Visit (HOSPITAL_COMMUNITY)
Admission: RE | Admit: 2017-03-24 | Discharge: 2017-03-24 | Disposition: A | Discharge status: Home / Self Care | Payer: 59 | Source: Ambulatory Visit | Attending: Surgery | Admitting: Surgery

## 2017-03-24 DIAGNOSIS — F419 Anxiety disorder, unspecified: Secondary | ICD-10-CM | POA: Insufficient documentation

## 2017-03-24 DIAGNOSIS — Z8379 Family history of other diseases of the digestive system: Secondary | ICD-10-CM | POA: Insufficient documentation

## 2017-03-24 DIAGNOSIS — I10 Essential (primary) hypertension: Secondary | ICD-10-CM | POA: Insufficient documentation

## 2017-03-24 DIAGNOSIS — Z833 Family history of diabetes mellitus: Secondary | ICD-10-CM | POA: Insufficient documentation

## 2017-03-24 DIAGNOSIS — Z820 Family history of epilepsy and other diseases of the nervous system: Secondary | ICD-10-CM | POA: Insufficient documentation

## 2017-03-24 DIAGNOSIS — Z8249 Family history of ischemic heart disease and other diseases of the circulatory system: Secondary | ICD-10-CM | POA: Insufficient documentation

## 2017-03-24 DIAGNOSIS — Z888 Allergy status to other drugs, medicaments and biological substances status: Secondary | ICD-10-CM | POA: Insufficient documentation

## 2017-03-24 DIAGNOSIS — Z79899 Other long term (current) drug therapy: Secondary | ICD-10-CM | POA: Insufficient documentation

## 2017-03-24 DIAGNOSIS — Z818 Family history of other mental and behavioral disorders: Secondary | ICD-10-CM | POA: Insufficient documentation

## 2017-03-24 DIAGNOSIS — L0501 Pilonidal cyst with abscess: Secondary | ICD-10-CM | POA: Insufficient documentation

## 2017-03-24 DIAGNOSIS — Z87891 Personal history of nicotine dependence: Secondary | ICD-10-CM | POA: Insufficient documentation

## 2017-03-24 DIAGNOSIS — Z836 Family history of other diseases of the respiratory system: Secondary | ICD-10-CM | POA: Insufficient documentation

## 2017-03-24 DIAGNOSIS — Z7982 Long term (current) use of aspirin: Secondary | ICD-10-CM | POA: Insufficient documentation

## 2017-03-24 DIAGNOSIS — Z8261 Family history of arthritis: Secondary | ICD-10-CM | POA: Insufficient documentation

## 2017-03-24 HISTORY — PX: PILONIDAL CYST EXCISION: SHX744

## 2017-03-24 LAB — GLUCOSE, CAPILLARY
GLUCOSE-CAPILLARY: 186 mg/dL — AB (ref 65–99)
Glucose-Capillary: 177 mg/dL — ABNORMAL HIGH (ref 65–99)

## 2017-03-24 SURGERY — EXCISION, PILONIDAL CYST, EXTENSIVE
Anesthesia: General

## 2017-03-24 MED ORDER — MEPERIDINE HCL 50 MG/ML IJ SOLN: 6.2500 mg | INTRAMUSCULAR | Status: DC | PRN

## 2017-03-24 MED ORDER — MORPHINE SULFATE (PF) 4 MG/ML IV SOLN: 1.0000 mg | INTRAVENOUS | Status: DC | PRN

## 2017-03-24 MED ORDER — ACETAMINOPHEN 650 MG RE SUPP
650.0000 mg | RECTAL | Status: DC | PRN
  Filled 2017-03-24: qty 1

## 2017-03-24 MED ORDER — SODIUM CHLORIDE 0.9% FLUSH: 3.0000 mL | INTRAVENOUS | Status: DC | PRN

## 2017-03-24 MED ORDER — OXYCODONE HCL 5 MG PO TABS
5.0000 mg | ORAL_TABLET | ORAL | Status: DC | PRN
Start: 2017-03-24 — End: 2017-03-24

## 2017-03-24 MED ORDER — CHLORHEXIDINE GLUCONATE CLOTH 2 % EX PADS: 6.0000 | MEDICATED_PAD | Freq: Once | CUTANEOUS | Status: DC

## 2017-03-24 MED ORDER — ACETAMINOPHEN 325 MG PO TABS: 650.0000 mg | ORAL_TABLET | ORAL | Status: DC | PRN

## 2017-03-24 MED ORDER — BUPIVACAINE-EPINEPHRINE (PF) 0.5% -1:200000 IJ SOLN
INTRAMUSCULAR | Status: AC
  Filled 2017-03-24: qty 30

## 2017-03-24 MED ORDER — FENTANYL CITRATE (PF) 100 MCG/2ML IJ SOLN
INTRAMUSCULAR | Status: DC | PRN
  Administered 2017-03-24 (×2): 50 ug via INTRAVENOUS

## 2017-03-24 MED ORDER — PROPOFOL 10 MG/ML IV BOLUS
INTRAVENOUS | Status: DC | PRN
  Administered 2017-03-24: 150 mg via INTRAVENOUS

## 2017-03-24 MED ORDER — SUCCINYLCHOLINE CHLORIDE 200 MG/10ML IV SOSY
PREFILLED_SYRINGE | INTRAVENOUS | Status: AC
  Filled 2017-03-24: qty 10

## 2017-03-24 MED ORDER — ONDANSETRON HCL 4 MG/2ML IJ SOLN
INTRAMUSCULAR | Status: DC | PRN
  Administered 2017-03-24: 4 mg via INTRAVENOUS

## 2017-03-24 MED ORDER — FENTANYL CITRATE (PF) 250 MCG/5ML IJ SOLN
INTRAMUSCULAR | Status: AC
  Filled 2017-03-24: qty 5

## 2017-03-24 MED ORDER — LIDOCAINE 2% (20 MG/ML) 5 ML SYRINGE
INTRAMUSCULAR | Status: DC | PRN
  Administered 2017-03-24: 100 mg via INTRAVENOUS

## 2017-03-24 MED ORDER — OXYCODONE HCL 5 MG PO TABS: 5.0000 mg | ORAL_TABLET | Freq: Once | ORAL | Status: DC | PRN

## 2017-03-24 MED ORDER — SUGAMMADEX SODIUM 200 MG/2ML IV SOLN
INTRAVENOUS | Status: DC | PRN
  Administered 2017-03-24: 200 mg via INTRAVENOUS

## 2017-03-24 MED ORDER — SUCCINYLCHOLINE CHLORIDE 200 MG/10ML IV SOSY
PREFILLED_SYRINGE | INTRAVENOUS | Status: DC | PRN
  Administered 2017-03-24: 100 mg via INTRAVENOUS

## 2017-03-24 MED ORDER — HYDROMORPHONE HCL 1 MG/ML IJ SOLN: 0.2500 mg | INTRAMUSCULAR | Status: DC | PRN

## 2017-03-24 MED ORDER — BUPIVACAINE HCL (PF) 0.5 % IJ SOLN
INTRAMUSCULAR | Status: DC | PRN
  Administered 2017-03-24: 30 mL

## 2017-03-24 MED ORDER — SODIUM CHLORIDE 0.9 % IV SOLN: 250.0000 mL | INTRAVENOUS | Status: DC | PRN

## 2017-03-24 MED ORDER — 0.9 % SODIUM CHLORIDE (POUR BTL) OPTIME
TOPICAL | Status: DC | PRN
  Administered 2017-03-24: 1000 mL

## 2017-03-24 MED ORDER — CLINDAMYCIN HCL 300 MG PO CAPS: 300.0000 mg | ORAL_CAPSULE | Freq: Three times a day (TID) | ORAL | 0 refills | Status: AC

## 2017-03-24 MED ORDER — ROCURONIUM BROMIDE 50 MG/5ML IV SOSY
PREFILLED_SYRINGE | INTRAVENOUS | Status: DC | PRN
  Administered 2017-03-24: 20 mg via INTRAVENOUS

## 2017-03-24 MED ORDER — MIDAZOLAM HCL 5 MG/5ML IJ SOLN
INTRAMUSCULAR | Status: DC | PRN
  Administered 2017-03-24: 2 mg via INTRAVENOUS

## 2017-03-24 MED ORDER — ROCURONIUM BROMIDE 50 MG/5ML IV SOSY
PREFILLED_SYRINGE | INTRAVENOUS | Status: AC
  Filled 2017-03-24: qty 5

## 2017-03-24 MED ORDER — PROPOFOL 10 MG/ML IV BOLUS
INTRAVENOUS | Status: AC
  Filled 2017-03-24: qty 20

## 2017-03-24 MED ORDER — OXYCODONE HCL 5 MG/5ML PO SOLN
5.0000 mg | Freq: Once | ORAL | Status: DC | PRN
  Filled 2017-03-24: qty 5

## 2017-03-24 MED ORDER — SUGAMMADEX SODIUM 200 MG/2ML IV SOLN
INTRAVENOUS | Status: AC
Start: 2017-03-24 — End: 2017-03-24
  Filled 2017-03-24: qty 2

## 2017-03-24 MED ORDER — OXYCODONE HCL 5 MG PO TABS: 5.0000 mg | ORAL_TABLET | Freq: Four times a day (QID) | ORAL | 0 refills | Status: DC | PRN

## 2017-03-24 MED ORDER — LACTATED RINGERS IV SOLN
INTRAVENOUS | Status: DC
  Administered 2017-03-24 (×2): via INTRAVENOUS

## 2017-03-24 MED ORDER — LIDOCAINE 2% (20 MG/ML) 5 ML SYRINGE
INTRAMUSCULAR | Status: AC
  Filled 2017-03-24: qty 5

## 2017-03-24 MED ORDER — PROMETHAZINE HCL 25 MG/ML IJ SOLN: 6.2500 mg | INTRAMUSCULAR | Status: DC | PRN

## 2017-03-24 MED ORDER — ONDANSETRON HCL 4 MG/2ML IJ SOLN
INTRAMUSCULAR | Status: AC
  Filled 2017-03-24: qty 2

## 2017-03-24 MED ORDER — DEXAMETHASONE SODIUM PHOSPHATE 10 MG/ML IJ SOLN
INTRAMUSCULAR | Status: AC
  Filled 2017-03-24: qty 1

## 2017-03-24 MED ORDER — MIDAZOLAM HCL 2 MG/2ML IJ SOLN
INTRAMUSCULAR | Status: AC
  Filled 2017-03-24: qty 2

## 2017-03-24 MED ORDER — CEFAZOLIN SODIUM-DEXTROSE 2-4 GM/100ML-% IV SOLN
2.0000 g | INTRAVENOUS | Status: AC
  Administered 2017-03-24: 2 g via INTRAVENOUS
  Filled 2017-03-24: qty 100

## 2017-03-24 MED ORDER — SODIUM CHLORIDE 0.9% FLUSH: 3.0000 mL | Freq: Two times a day (BID) | INTRAVENOUS | Status: DC

## 2017-03-24 SURGICAL SUPPLY — 16 items
DRAPE LAPAROTOMY T 102X78X121 (DRAPES) ×2 IMPLANT
DRSG PAD ABDOMINAL 8X10 ST (GAUZE/BANDAGES/DRESSINGS) ×1 IMPLANT
GAUZE SPONGE 4X4 12PLY STRL (GAUZE/BANDAGES/DRESSINGS) ×4 IMPLANT
GLOVE BIOGEL PI IND STRL 7.0 (GLOVE) ×1 IMPLANT
GLOVE BIOGEL PI INDICATOR 7.0 (GLOVE) ×1
GLOVE SURG SIGNA 7.5 PF LTX (GLOVE) ×4 IMPLANT
GOWN STRL REUS W/TWL LRG LVL3 (GOWN DISPOSABLE) ×4 IMPLANT
GOWN STRL REUS W/TWL XL LVL3 (GOWN DISPOSABLE) ×4 IMPLANT
KIT BASIN OR (CUSTOM PROCEDURE TRAY) ×2 IMPLANT
NEEDLE HYPO 22GX1.5 SAFETY (NEEDLE) IMPLANT
PACK GENERAL/GYN (CUSTOM PROCEDURE TRAY) ×2 IMPLANT
PAD ABD 8X10 STRL (GAUZE/BANDAGES/DRESSINGS) ×1 IMPLANT
SWAB COLLECTION DEVICE MRSA (MISCELLANEOUS) IMPLANT
SYR CONTROL 10ML LL (SYRINGE) ×2 IMPLANT
TAPE CLOTH SURG 6X10 WHT LF (GAUZE/BANDAGES/DRESSINGS) ×1 IMPLANT
TOWEL OR 17X26 10 PK STRL BLUE (TOWEL DISPOSABLE) ×4 IMPLANT

## 2017-03-24 NOTE — Interval H&P Note (Signed)
History and Physical Interval Note: no change in H and P 03/24/2017 9:47 AM  [name] [name]  has presented today for surgery, with the diagnosis of pilonidal abscess  The various methods of treatment have been discussed with the patient and family. After consideration of risks, benefits and other options for treatment, the patient has consented to  Procedure(s): EXCISION CHRONIC  PILONIDAL ABSCESS (N/A) as a surgical intervention .  The patient's history has been reviewed, patient examined, no change in status, stable for surgery.  I have reviewed the patient's chart and labs.  Questions were answered to the patient's satisfaction.     Arseniy Toomey A

## 2017-03-24 NOTE — Op Note (Signed)
EXCISION CHRONIC  PILONIDAL ABSCESS  Procedure Note  [name] [name] 03/24/2017   Pre-op Diagnosis: pilonidal abscess     Post-op Diagnosis: same  Procedure(s): EXCISION CHRONIC  PILONIDAL ABSCESS  Surgeon(s): [name] MiyamotoBlackman, Shaunie Boehm, MD  Anesthesia: General  Staff:  Circulator: Silas SacramentoBurnham, Leslie B, RN Scrub Person: Rene KocherSoto, Rebecca C Circulator Assistant: Gerda DissBarker, Lynn S, RN; Marcelino ScotNelson, Ashlee M  Estimated Blood Loss: Minimal               Procedure: The patient was brought to the operating room and identified as the correct patient.  General anesthesia was induced in his stretcher.  He was then placed in a prone position on the operating table.  The gluteal cleft was then prepped and draped in the usual sterile fashion.  He had a chronic abscess just to the left of the midline.  I performed an elliptical incision incorporating this abscess and multiple ingrown hairs with a scalpel.  I then completely excised the area with the electrocautery going down to the sacrum.  There was some purulence in the wound.  I then achieved hemostasis with cautery.  I irrigated the wound thoroughly with saline.  I anesthetized with Marcaine.  I then loosely reapproximated the skin with several 2-0 nylon mattress sutures.  Gauze and tape were then applied.  The patient tolerated the procedure well.  All the counts were correct at the end of the procedure.  The patient was then extubated in the operating room and taken in stable condition to the recovery room.          [name]   Date: 03/24/2017  Time: 10:45 AM

## 2017-03-24 NOTE — Transfer of Care (Signed)
Immediate Anesthesia Transfer of Care Note  Patient: [name] [name]  Procedure(s) Performed: EXCISION CHRONIC  PILONIDAL ABSCESS (N/A )  Patient Location: PACU  Anesthesia Type:General  Level of Consciousness: awake and alert   Airway & Oxygen Therapy: Patient Spontanous Breathing and Patient connected to face mask oxygen  Post-op Assessment: Report given to RN and Post -op Vital signs reviewed and stable  Post vital signs: Reviewed and stable  Last Vitals:  Vitals:   03/24/17 0745 03/24/17 0746  BP:  (!) 152/94  Pulse: 97   Resp: 18   Temp: 37.1 C   SpO2: 100%     Last Pain:  Vitals:   03/24/17 0745  TempSrc: Oral      Patients Stated Pain Goal: 4 (03/24/17 0745)  Complications: No apparent anesthesia complications

## 2017-03-24 NOTE — Discharge Instructions (Signed)
Expect drainage from the wound  Cover with a dry bandage as needed  You may start showering tomorrow  Try an ice pack, Tylenol, and ibuprofen also for pain   Call for any redness, drainage, elevated temperature greater than 100.5 degrees F, or increase pain at wound site.   General Anesthesia, Adult, Care After These instructions provide you with information about caring for yourself after your procedure. Your health care provider may also give you more specific instructions. Your treatment has been planned according to current medical practices, but problems sometimes occur. Call your health care provider if you have any problems or questions after your procedure. What can I expect after the procedure? After the procedure, it is common to have:  Vomiting.  A sore throat.  Mental slowness.  It is common to feel:  Nauseous.  Cold or shivery.  Sleepy.  Tired.  Sore or achy, even in parts of your body where you did not have surgery.  Follow these instructions at home: For at least 24 hours after the procedure:  Do not: ? Participate in activities where you could fall or become injured. ? Drive. ? Use heavy machinery. ? Drink alcohol. ? Take sleeping pills or medicines that cause drowsiness. ? Make important decisions or sign legal documents. ? Take care of children on your own.  Rest. Eating and drinking  If you vomit, drink water, juice, or soup when you can drink without vomiting.  Drink enough fluid to keep your urine clear or pale yellow.  Make sure you have little or no nausea before eating solid foods.  Follow the diet recommended by your health care provider. General instructions  Have a responsible adult stay with you until you are awake and alert.  Return to your normal activities as told by your health care provider. Ask your health care provider what activities are safe for you.  Take over-the-counter and prescription medicines only as told by  your health care provider.  If you smoke, do not smoke without supervision.  Keep all follow-up visits as told by your health care provider. This is important. Contact a health care provider if:  You continue to have nausea or vomiting at home, and medicines are not helpful.  You cannot drink fluids or start eating again.  You cannot urinate after 8-12 hours.  You develop a skin rash.  You have fever.  You have increasing redness at the site of your procedure. Get help right away if:  You have difficulty breathing.  You have chest pain.  You have unexpected bleeding.  You feel that you are having a life-threatening or urgent problem. This information is not intended to replace advice given to you by your health care provider. Make sure you discuss any questions you have with your health care provider. Document Released: 08/23/2000 Document Revised: 10/20/2015 Document Reviewed: 05/01/2015 Elsevier Interactive Patient Education  [employer]2018 [employer]

## 2017-03-24 NOTE — Anesthesia Postprocedure Evaluation (Signed)
Anesthesia Post Note  Patient: [name] [name]  Procedure(s) Performed: EXCISION CHRONIC  PILONIDAL ABSCESS (N/A )     Patient location during evaluation: PACU Anesthesia Type: General Level of consciousness: awake and alert Pain management: pain level controlled Vital Signs Assessment: post-procedure vital signs reviewed and stable Respiratory status: spontaneous breathing, nonlabored ventilation and respiratory function stable Cardiovascular status: blood pressure returned to baseline and stable Postop Assessment: no apparent nausea or vomiting Anesthetic complications: no    Last Vitals:  Vitals:   03/24/17 1130 03/24/17 1138  BP: 113/73 102/61  Pulse: 96 96  Resp: 17 18  Temp: 36.7 C 37.1 C  SpO2: 94% 95%    Last Pain:  Vitals:   03/24/17 0745  TempSrc: Oral                 Lowella CurbWarren Ray Mansoor Hillyard

## 2017-03-24 NOTE — Anesthesia Preprocedure Evaluation (Addendum)
Anesthesia Evaluation  Patient identified by MRN, date of birth, ID band Patient awake    Reviewed: Allergy & Precautions, NPO status , Patient's Chart, lab work & pertinent test results, reviewed documented beta blocker date and time   Airway Mallampati: II  TM Distance: >3 FB Neck ROM: Full    Dental no notable dental hx. (+) Edentulous Upper, Edentulous Lower   Pulmonary neg pulmonary ROS, Current Smoker,    Pulmonary exam normal breath sounds clear to auscultation       Cardiovascular hypertension, Pt. on medications and Pt. on home beta blockers negative cardio ROS Normal cardiovascular exam Rhythm:Regular Rate:Normal     Neuro/Psych Anxiety negative neurological ROS  negative psych ROS   GI/Hepatic negative GI ROS, Neg liver ROS, GERD  Medicated,  Endo/Other  negative endocrine ROSdiabetes, Type 2, Oral Hypoglycemic Agents, Insulin Dependent  Renal/GU negative Renal ROS  negative genitourinary   Musculoskeletal negative musculoskeletal ROS (+)   Abdominal   Peds negative pediatric ROS (+)  Hematology negative hematology ROS (+)   Anesthesia Other Findings   Reproductive/Obstetrics negative OB ROS                            Anesthesia Physical Anesthesia Plan  ASA: III  Anesthesia Plan: General   Post-op Pain Management:    Induction: Intravenous  PONV Risk Score and Plan: 1 and Ondansetron and Treatment may vary due to age or medical condition  Airway Management Planned: Oral ETT  Additional Equipment:   Intra-op Plan:   Post-operative Plan: Extubation in OR  Informed Consent: I have reviewed the patients History and Physical, chart, labs and discussed the procedure including the risks, benefits and alternatives for the proposed anesthesia with the patient or authorized representative who has indicated his/her understanding and acceptance.   Dental advisory  given  Plan Discussed with: CRNA  Anesthesia Plan Comments:         Anesthesia Quick Evaluation

## 2017-03-24 NOTE — Anesthesia Procedure Notes (Signed)
Procedure Name: Intubation Date/Time: 03/24/2017 10:17 AM Performed by: Maxwell Caul Pre-anesthesia Checklist: Patient identified, Emergency Drugs available, Suction available and Patient being monitored Patient Re-evaluated:Patient Re-evaluated prior to induction Oxygen Delivery Method: Circle system utilized Preoxygenation: Pre-oxygenation with 100% oxygen Induction Type: IV induction Ventilation: Mask ventilation without difficulty Laryngoscope Size: Mac and 4 Tube type: Oral Tube size: 7.5 mm Number of attempts: 1 Airway Equipment and Method: Stylet and Oral airway Placement Confirmation: ETT inserted through vocal cords under direct vision,  positive ETCO2 and breath sounds checked- equal and bilateral Secured at: 21 cm Tube secured with: Tape Dental Injury: Teeth and Oropharynx as per pre-operative assessment

## 2017-03-31 DIAGNOSIS — E559 Vitamin D deficiency, unspecified: Secondary | ICD-10-CM | POA: Insufficient documentation

## 2017-03-31 DIAGNOSIS — E782 Mixed hyperlipidemia: Secondary | ICD-10-CM | POA: Insufficient documentation

## 2017-03-31 DIAGNOSIS — E1142 Type 2 diabetes mellitus with diabetic polyneuropathy: Secondary | ICD-10-CM | POA: Insufficient documentation

## 2017-03-31 DIAGNOSIS — E538 Deficiency of other specified B group vitamins: Secondary | ICD-10-CM | POA: Insufficient documentation

## 2017-05-20 ENCOUNTER — Ambulatory Visit: Payer: 59 | Attending: Physician Assistant | Admitting: Neurology

## 2017-05-20 DIAGNOSIS — G4733 Obstructive sleep apnea (adult) (pediatric): Secondary | ICD-10-CM | POA: Insufficient documentation

## 2017-05-23 ENCOUNTER — Other Ambulatory Visit: Payer: Self-pay

## 2017-05-23 ENCOUNTER — Encounter (HOSPITAL_COMMUNITY): Payer: Self-pay | Admitting: Nurse Practitioner

## 2017-05-23 DIAGNOSIS — F418 Other specified anxiety disorders: Secondary | ICD-10-CM | POA: Insufficient documentation

## 2017-05-23 DIAGNOSIS — F17210 Nicotine dependence, cigarettes, uncomplicated: Secondary | ICD-10-CM | POA: Diagnosis not present

## 2017-05-23 DIAGNOSIS — E1122 Type 2 diabetes mellitus with diabetic chronic kidney disease: Secondary | ICD-10-CM | POA: Diagnosis not present

## 2017-05-23 DIAGNOSIS — Z7982 Long term (current) use of aspirin: Secondary | ICD-10-CM | POA: Insufficient documentation

## 2017-05-23 DIAGNOSIS — K920 Hematemesis: Secondary | ICD-10-CM | POA: Diagnosis not present

## 2017-05-23 DIAGNOSIS — Z79891 Long term (current) use of opiate analgesic: Secondary | ICD-10-CM | POA: Diagnosis not present

## 2017-05-23 DIAGNOSIS — E785 Hyperlipidemia, unspecified: Secondary | ICD-10-CM | POA: Diagnosis not present

## 2017-05-23 DIAGNOSIS — Z23 Encounter for immunization: Secondary | ICD-10-CM | POA: Diagnosis not present

## 2017-05-23 DIAGNOSIS — K219 Gastro-esophageal reflux disease without esophagitis: Secondary | ICD-10-CM | POA: Insufficient documentation

## 2017-05-23 DIAGNOSIS — Z79899 Other long term (current) drug therapy: Secondary | ICD-10-CM | POA: Insufficient documentation

## 2017-05-23 DIAGNOSIS — Z794 Long term (current) use of insulin: Secondary | ICD-10-CM | POA: Diagnosis not present

## 2017-05-23 DIAGNOSIS — N183 Chronic kidney disease, stage 3 (moderate): Secondary | ICD-10-CM | POA: Insufficient documentation

## 2017-05-23 DIAGNOSIS — R61 Generalized hyperhidrosis: Secondary | ICD-10-CM | POA: Diagnosis not present

## 2017-05-23 DIAGNOSIS — E1110 Type 2 diabetes mellitus with ketoacidosis without coma: Secondary | ICD-10-CM | POA: Diagnosis not present

## 2017-05-23 DIAGNOSIS — D72829 Elevated white blood cell count, unspecified: Secondary | ICD-10-CM | POA: Insufficient documentation

## 2017-05-23 DIAGNOSIS — F329 Major depressive disorder, single episode, unspecified: Secondary | ICD-10-CM | POA: Diagnosis not present

## 2017-05-23 DIAGNOSIS — I129 Hypertensive chronic kidney disease with stage 1 through stage 4 chronic kidney disease, or unspecified chronic kidney disease: Secondary | ICD-10-CM | POA: Insufficient documentation

## 2017-05-23 LAB — URINALYSIS, ROUTINE W REFLEX MICROSCOPIC
BACTERIA UA: NONE SEEN
Bilirubin Urine: NEGATIVE
Glucose, UA: >=500 mg/dL — AB
Ketones, ur: 80 mg/dL — AB
Leukocytes, UA: NEGATIVE
Nitrite: NEGATIVE
PROTEIN: 100 mg/dL — AB
SPECIFIC GRAVITY, URINE: 1.027 (ref 1.005–1.030)
pH: 5 (ref 5.0–8.0)

## 2017-05-23 LAB — CBC
HCT: 45.2 % (ref 39.0–52.0)
HEMOGLOBIN: 15.8 g/dL (ref 13.0–17.0)
MCH: 31.9 pg (ref 26.0–34.0)
MCHC: 35 g/dL (ref 30.0–36.0)
MCV: 91.3 fL (ref 78.0–100.0)
Platelets: 260 10*3/uL (ref 150–400)
RBC: 4.95 MIL/uL (ref 4.22–5.81)
RDW: 12.6 % (ref 11.5–15.5)
WBC: 19.3 10*3/uL — ABNORMAL HIGH (ref 4.0–10.5)

## 2017-05-23 LAB — DIFFERENTIAL
Basophils Absolute: 0 10*3/uL (ref 0.0–0.1)
Basophils Relative: 0 %
Eosinophils Absolute: 0 10*3/uL (ref 0.0–0.7)
Eosinophils Relative: 0 %
Lymphocytes Relative: 6 %
Lymphs Abs: 1.2 10*3/uL (ref 0.7–4.0)
Monocytes Absolute: 0.6 10*3/uL (ref 0.1–1.0)
Monocytes Relative: 3 %
Neutro Abs: 17.5 10*3/uL — ABNORMAL HIGH (ref 1.7–7.7)
Neutrophils Relative %: 91 %

## 2017-05-23 LAB — COMPREHENSIVE METABOLIC PANEL
ALBUMIN: 3.7 g/dL (ref 3.5–5.0)
ALK PHOS: 120 U/L (ref 38–126)
ALT: 22 U/L (ref 17–63)
ANION GAP: 18 — AB (ref 5–15)
AST: 23 U/L (ref 15–41)
BUN: 18 mg/dL (ref 6–20)
CALCIUM: 9.1 mg/dL (ref 8.9–10.3)
CHLORIDE: 97 mmol/L — AB (ref 101–111)
CO2: 17 mmol/L — AB (ref 22–32)
Creatinine, Ser: 1.57 mg/dL — ABNORMAL HIGH (ref 0.61–1.24)
GFR calc non Af Amer: 51 mL/min — ABNORMAL LOW (ref >60)
GFR, EST AFRICAN AMERICAN: 59 mL/min — AB (ref >60)
GLUCOSE: 376 mg/dL — AB (ref 65–99)
Potassium: 4.7 mmol/L (ref 3.5–5.1)
SODIUM: 132 mmol/L — AB (ref 135–145)
Total Bilirubin: 2 mg/dL — ABNORMAL HIGH (ref 0.3–1.2)
Total Protein: 7.6 g/dL (ref 6.5–8.1)

## 2017-05-23 LAB — INFLUENZA PANEL BY PCR (TYPE A & B)
Influenza A By PCR: NEGATIVE
Influenza B By PCR: NEGATIVE

## 2017-05-23 LAB — LIPASE, BLOOD: LIPASE: 18 U/L (ref 11–51)

## 2017-05-23 MED ORDER — ONDANSETRON 4 MG PO TBDP
4.0000 mg | ORAL_TABLET | Freq: Once | ORAL | Status: AC | PRN
  Administered 2017-05-23: 4 mg via ORAL
  Filled 2017-05-23: qty 1

## 2017-05-23 NOTE — ED Triage Notes (Signed)
Pt endorses generalized body aches and emesis started this morning and chills. Pt denies cough, rhinorrhea, diarrhea, urinary symptoms. Pt denies sick contacts or known bad food.

## 2017-05-24 ENCOUNTER — Inpatient Hospital Stay (HOSPITAL_COMMUNITY): Payer: 59

## 2017-05-24 ENCOUNTER — Other Ambulatory Visit: Payer: Self-pay

## 2017-05-24 ENCOUNTER — Emergency Department (HOSPITAL_COMMUNITY): Payer: 59

## 2017-05-24 ENCOUNTER — Encounter (HOSPITAL_COMMUNITY): Payer: Self-pay | Admitting: Internal Medicine

## 2017-05-24 ENCOUNTER — Observation Stay (HOSPITAL_COMMUNITY)
Admission: EM | Admit: 2017-05-24 | Discharge: 2017-05-25 | Disposition: A | Discharge status: Home / Self Care | Payer: 59 | Attending: Internal Medicine | Admitting: Internal Medicine

## 2017-05-24 DIAGNOSIS — R112 Nausea with vomiting, unspecified: Secondary | ICD-10-CM | POA: Diagnosis present

## 2017-05-24 DIAGNOSIS — I10 Essential (primary) hypertension: Secondary | ICD-10-CM | POA: Diagnosis present

## 2017-05-24 DIAGNOSIS — E1310 Other specified diabetes mellitus with ketoacidosis without coma: Secondary | ICD-10-CM

## 2017-05-24 DIAGNOSIS — E1110 Type 2 diabetes mellitus with ketoacidosis without coma: Secondary | ICD-10-CM | POA: Diagnosis present

## 2017-05-24 LAB — BASIC METABOLIC PANEL
Anion gap: 16 — ABNORMAL HIGH (ref 5–15)
Anion gap: 11 (ref 5–15)
BUN: 21 mg/dL — AB (ref 6–20)
BUN: 17 mg/dL (ref 6–20)
CHLORIDE: 104 mmol/L (ref 101–111)
CHLORIDE: 104 mmol/L (ref 101–111)
CO2: 17 mmol/L — ABNORMAL LOW (ref 22–32)
CO2: 22 mmol/L (ref 22–32)
CREATININE: 1.34 mg/dL — AB (ref 0.61–1.24)
CREATININE: 0.92 mg/dL (ref 0.61–1.24)
Calcium: 8.4 mg/dL — ABNORMAL LOW (ref 8.9–10.3)
Calcium: 8.4 mg/dL — ABNORMAL LOW (ref 8.9–10.3)
GFR calc Af Amer: >60 mL/min (ref >60)
GFR calc Af Amer: >60 mL/min (ref >60)
GFR calc non Af Amer: >60 mL/min (ref >60)
GFR calc non Af Amer: >60 mL/min (ref >60)
Glucose, Bld: 308 mg/dL — ABNORMAL HIGH (ref 65–99)
Glucose, Bld: 181 mg/dL — ABNORMAL HIGH (ref 65–99)
Potassium: 4.3 mmol/L (ref 3.5–5.1)
Potassium: 4 mmol/L (ref 3.5–5.1)
SODIUM: 137 mmol/L (ref 135–145)
SODIUM: 137 mmol/L (ref 135–145)

## 2017-05-24 LAB — CBC WITH DIFFERENTIAL/PLATELET
Basophils Absolute: 0 10*3/uL (ref 0.0–0.1)
Basophils Relative: 0 %
EOS ABS: 0 10*3/uL (ref 0.0–0.7)
Eosinophils Relative: 0 %
HCT: 43.5 % (ref 39.0–52.0)
HEMOGLOBIN: 14.9 g/dL (ref 13.0–17.0)
LYMPHS ABS: 1.1 10*3/uL (ref 0.7–4.0)
LYMPHS PCT: 7 %
MCH: 31.4 pg (ref 26.0–34.0)
MCHC: 34.3 g/dL (ref 30.0–36.0)
MCV: 91.8 fL (ref 78.0–100.0)
Monocytes Absolute: 0.8 10*3/uL (ref 0.1–1.0)
Monocytes Relative: 6 %
NEUTROS PCT: 87 %
Neutro Abs: 13 10*3/uL — ABNORMAL HIGH (ref 1.7–7.7)
Platelets: 245 10*3/uL (ref 150–400)
RBC: 4.74 MIL/uL (ref 4.22–5.81)
RDW: 12.7 % (ref 11.5–15.5)
WBC: 14.9 10*3/uL — AB (ref 4.0–10.5)

## 2017-05-24 LAB — CBG MONITORING, ED
GLUCOSE-CAPILLARY: 274 mg/dL — AB (ref 65–99)
Glucose-Capillary: 368 mg/dL — ABNORMAL HIGH (ref 65–99)
Glucose-Capillary: 269 mg/dL — ABNORMAL HIGH (ref 65–99)

## 2017-05-24 LAB — GLUCOSE, CAPILLARY
GLUCOSE-CAPILLARY: 163 mg/dL — AB (ref 65–99)
GLUCOSE-CAPILLARY: 184 mg/dL — AB (ref 65–99)
GLUCOSE-CAPILLARY: 143 mg/dL — AB (ref 65–99)
GLUCOSE-CAPILLARY: 204 mg/dL — AB (ref 65–99)
Glucose-Capillary: 207 mg/dL — ABNORMAL HIGH (ref 65–99)
Glucose-Capillary: 172 mg/dL — ABNORMAL HIGH (ref 65–99)
Glucose-Capillary: 126 mg/dL — ABNORMAL HIGH (ref 65–99)
Glucose-Capillary: 108 mg/dL — ABNORMAL HIGH (ref 65–99)
Glucose-Capillary: 130 mg/dL — ABNORMAL HIGH (ref 65–99)
Glucose-Capillary: 213 mg/dL — ABNORMAL HIGH (ref 65–99)

## 2017-05-24 LAB — HEPATIC FUNCTION PANEL
ALK PHOS: 108 U/L (ref 38–126)
ALT: 19 U/L (ref 17–63)
AST: 18 U/L (ref 15–41)
Albumin: 3.3 g/dL — ABNORMAL LOW (ref 3.5–5.0)
BILIRUBIN DIRECT: 0.1 mg/dL (ref 0.1–0.5)
BILIRUBIN INDIRECT: 1.6 mg/dL — AB (ref 0.3–0.9)
BILIRUBIN TOTAL: 1.7 mg/dL — AB (ref 0.3–1.2)
Total Protein: 7.2 g/dL (ref 6.5–8.1)

## 2017-05-24 LAB — RAPID URINE DRUG SCREEN, HOSP PERFORMED
Amphetamines: NOT DETECTED
BARBITURATES: NOT DETECTED
BENZODIAZEPINES: NOT DETECTED
COCAINE: NOT DETECTED
Opiates: NOT DETECTED
TETRAHYDROCANNABINOL: NOT DETECTED

## 2017-05-24 LAB — TROPONIN I
Troponin I: <0.03 ng/mL (ref <0.03)
Troponin I: <0.03 ng/mL (ref <0.03)

## 2017-05-24 LAB — PROTIME-INR
INR: 1.1
Prothrombin Time: 14.1 seconds (ref 11.4–15.2)

## 2017-05-24 LAB — MRSA PCR SCREENING: MRSA by PCR: NEGATIVE

## 2017-05-24 LAB — HEMOGLOBIN A1C
Hgb A1c MFr Bld: 8.7 % — ABNORMAL HIGH (ref 4.8–5.6)
Mean Plasma Glucose: 202.99 mg/dL

## 2017-05-24 LAB — TSH: TSH: 1.772 u[IU]/mL (ref 0.350–4.500)

## 2017-05-24 LAB — APTT: aPTT: 30 seconds (ref 24–36)

## 2017-05-24 LAB — LACTIC ACID, PLASMA: Lactic Acid, Venous: 1.2 mmol/L (ref 0.5–1.9)

## 2017-05-24 MED ORDER — DEXTROSE-NACL 5-0.45 % IV SOLN
INTRAVENOUS | Status: AC
  Administered 2017-05-24: 07:00:00 via INTRAVENOUS

## 2017-05-24 MED ORDER — METOPROLOL TARTRATE 5 MG/5ML IV SOLN
5.0000 mg | Freq: Three times a day (TID) | INTRAVENOUS | Status: DC
  Administered 2017-05-24 – 2017-05-25 (×5): 5 mg via INTRAVENOUS
  Filled 2017-05-24 (×5): qty 5

## 2017-05-24 MED ORDER — GI COCKTAIL ~~LOC~~
30.0000 mL | Freq: Once | ORAL | Status: AC
  Administered 2017-05-24: 30 mL via ORAL
  Filled 2017-05-24: qty 30

## 2017-05-24 MED ORDER — SODIUM CHLORIDE 0.9 % IV SOLN
INTRAVENOUS | Status: AC
  Administered 2017-05-24: 4.4 [IU]/h via INTRAVENOUS

## 2017-05-24 MED ORDER — PNEUMOCOCCAL VAC POLYVALENT 25 MCG/0.5ML IJ INJ
0.5000 mL | INJECTION | INTRAMUSCULAR | Status: AC
  Administered 2017-05-25: 0.5 mL via INTRAMUSCULAR
  Filled 2017-05-24: qty 0.5

## 2017-05-24 MED ORDER — DEXTROSE-NACL 5-0.45 % IV SOLN: INTRAVENOUS | Status: DC

## 2017-05-24 MED ORDER — ENSURE ENLIVE PO LIQD
237.0000 mL | Freq: Two times a day (BID) | ORAL | Status: DC
  Administered 2017-05-24: 237 mL via ORAL

## 2017-05-24 MED ORDER — DEXTROSE 50 % IV SOLN: 25.0000 mL | INTRAVENOUS | Status: DC | PRN

## 2017-05-24 MED ORDER — INSULIN REGULAR BOLUS VIA INFUSION
0.0000 [IU] | Freq: Three times a day (TID) | INTRAVENOUS | Status: DC
  Filled 2017-05-24: qty 10

## 2017-05-24 MED ORDER — SODIUM CHLORIDE 0.9 % IV BOLUS (SEPSIS)
1000.0000 mL | Freq: Once | INTRAVENOUS | Status: AC
  Administered 2017-05-24: 1000 mL via INTRAVENOUS

## 2017-05-24 MED ORDER — INSULIN GLARGINE 100 UNIT/ML ~~LOC~~ SOLN
10.0000 [IU] | SUBCUTANEOUS | Status: DC
  Administered 2017-05-24 – 2017-05-25 (×2): 10 [IU] via SUBCUTANEOUS
  Filled 2017-05-24 (×2): qty 0.1

## 2017-05-24 MED ORDER — SODIUM CHLORIDE 0.9 % IV SOLN: INTRAVENOUS | Status: DC

## 2017-05-24 MED ORDER — ONDANSETRON HCL 4 MG/2ML IJ SOLN
4.0000 mg | Freq: Once | INTRAMUSCULAR | Status: AC
  Administered 2017-05-24: 4 mg via INTRAVENOUS
  Filled 2017-05-24: qty 2

## 2017-05-24 MED ORDER — ONDANSETRON HCL 4 MG/2ML IJ SOLN: 4.0000 mg | Freq: Three times a day (TID) | INTRAMUSCULAR | Status: DC | PRN

## 2017-05-24 MED ORDER — INSULIN ASPART 100 UNIT/ML ~~LOC~~ SOLN
0.0000 [IU] | Freq: Every day | SUBCUTANEOUS | Status: DC
  Administered 2017-05-24: 2 [IU] via SUBCUTANEOUS

## 2017-05-24 MED ORDER — FAMOTIDINE IN NACL 20-0.9 MG/50ML-% IV SOLN
20.0000 mg | Freq: Two times a day (BID) | INTRAVENOUS | Status: DC
  Administered 2017-05-24 – 2017-05-25 (×3): 20 mg via INTRAVENOUS
  Filled 2017-05-24 (×3): qty 50

## 2017-05-24 MED ORDER — SODIUM CHLORIDE 0.9 % IV SOLN
INTRAVENOUS | Status: DC
  Administered 2017-05-24: 3.1 [IU]/h via INTRAVENOUS
  Filled 2017-05-24: qty 1

## 2017-05-24 MED ORDER — SODIUM CHLORIDE 0.9 % IV SOLN
INTRAVENOUS | Status: DC
  Administered 2017-05-24: 14:00:00 via INTRAVENOUS

## 2017-05-24 MED ORDER — ACETAMINOPHEN 325 MG PO TABS: 650.0000 mg | ORAL_TABLET | Freq: Four times a day (QID) | ORAL | Status: DC | PRN

## 2017-05-24 MED ORDER — ALUM & MAG HYDROXIDE-SIMETH 200-200-20 MG/5ML PO SUSP: 30.0000 mL | ORAL | Status: DC | PRN

## 2017-05-24 MED ORDER — INSULIN ASPART 100 UNIT/ML ~~LOC~~ SOLN
0.0000 [IU] | Freq: Three times a day (TID) | SUBCUTANEOUS | Status: DC
  Administered 2017-05-24: 5 [IU] via SUBCUTANEOUS
  Administered 2017-05-25: 3 [IU] via SUBCUTANEOUS
  Administered 2017-05-25: 5 [IU] via SUBCUTANEOUS

## 2017-05-24 NOTE — ED Notes (Signed)
Paged  Admitting,     Kundrat room D31 is continue to have tachycardia in 120's and is now diaphoretic. oral Temp 99.3

## 2017-05-24 NOTE — ED Provider Notes (Signed)
[facility] EMERGENCY DEPARTMENT Provider Note   CSN: [id number] Arrival date & time: 05/23/17  2056     History   Chief Complaint Chief Complaint  Patient presents with  . Emesis    HPI [name] [name] is a 46 y.o. male.  HPI [name] [name] is a 46 y.o. male with history of diabetes, hypertension, hyperlipidemia, presents to emergency department with complaint of nausea and vomiting.  Patient states that he woke up at 5 AM yesterday morning with nausea and vomiting.  Initially vomiting just food contents and stomach acid.  He states gradually throughout the day that he has now started vomiting bright red blood.  He reports normal hard stools today, no diarrhea.  He reports mild epigastric abdominal pain.  He reports cough and congestion.  He reports chills, no fever.  He states he is unable to keep anything down which is what made him come to emergency department.  He states that he has missed some doses of his insulin yesterday and did not take any medications orally because he was has been vomiting.  Past Medical History:  Diagnosis Date  . Atypical chest pain   . Diabetes mellitus without complication (HCC)   . Hyperlipemia   . Hypertension   . Malaise and fatigue     Patient Active Problem List   Diagnosis Date Noted  . DKA (diabetic ketoacidoses) (HCC) 10/15/2016  . Gastroesophageal reflux disease 10/15/2016  . Anxiety with depression 10/15/2016  . Essential hypertension 10/15/2016  . Dehydration   . Leukocytosis 11/25/2015  . Intractable nausea and vomiting 11/25/2015  . Volume depletion 11/25/2015  . Hyperglycemia 11/25/2015  . Dental abscess 11/25/2015  . Nausea and vomiting 11/25/2015  . Angina pectoris (HCC) 03/27/2015    Past Surgical History:  Procedure Laterality Date  . ANKLE SURGERY  1982  . APPENDECTOMY  1975  . CARDIAC CATHETERIZATION N/A 03/28/2015   Procedure: Left Heart Cath and Coronary Angiography;  Surgeon: Yates Decamp, MD;   Location: [facility] INVASIVE CV LAB;  Service: Cardiovascular;  Laterality: N/A;  . CARDIAC CATHETERIZATION N/A 03/28/2015   Procedure: Intravascular Pressure Wire/FFR Study;  Surgeon: Yates Decamp, MD;  Location: [facility] INVASIVE CV LAB;  Service: Cardiovascular;  Laterality: N/A;  . CARPAL TUNNEL RELEASE Left   . ELBOW FRACTURE SURGERY Left   . PILONIDAL CYST EXCISION N/A 03/24/2017   Procedure: EXCISION CHRONIC  PILONIDAL ABSCESS;  Surgeon: [name] Miyamoto, MD;  Location: [facility] ORS;  Service: General;  Laterality: N/A;       Home Medications    Prior to Admission medications   Medication Sig Start Date End Date Taking? Authorizing Provider  ALPRAZolam Prudy Feeler) 1 MG tablet Take 1 mg by mouth at bedtime as needed for anxiety or sleep.    [provider]  aspirin 81 MG chewable tablet Chew 81 mg by mouth at bedtime.     [provider]  atorvastatin (LIPITOR) 40 MG tablet Take 40 mg by mouth daily.  07/29/14   [provider]  azelastine (OPTIVAR) 0.05 % ophthalmic solution Place 1 drop into both eyes 2 (two) times daily.    [provider]  buPROPion (WELLBUTRIN SR) 100 MG 12 hr tablet Take 100-300 mg by mouth 2 (two) times daily. Takes one tablet in the morning and three at night.    [provider]  glimepiride (AMARYL) 4 MG tablet Take 4 mg by mouth daily with breakfast.    [provider]  INFLUENZA VAC SPLIT  QUAD ID Fluzone Quad 2018-19(PF) 60 mcg(15 mcgx4)/0.5 mL intramuscular syringe  TO BE ADMINISTERED BY PHARMACIST FOR IMMUNIZATION    [provider]  Insulin Degludec (TRESIBA FLEXTOUCH) 200 UNIT/ML SOPN Inject 40 Units into the skin at bedtime.     [provider]  insulin lispro (HUMALOG KWIKPEN) 100 UNIT/ML KiwkPen Inject 4 Units into the skin every evening.    [provider]  losartan (COZAAR) 100 MG tablet Take 100 mg by mouth daily.    [provider]  metFORMIN (GLUCOPHAGE) 500 MG tablet Take 1  tablet (500 mg total) by mouth 2 (two) times daily with a meal. 12/01/15   Richarda OverlieAbrol, Nayana, MD  metoprolol succinate (TOPROL-XL) 50 MG 24 hr tablet Take 50 mg by mouth at bedtime.  10/28/15   [provider]  multivitamin-lutein (OCUVITE-LUTEIN) CAPS capsule Take 1 capsule by mouth daily.    [provider]  Omega-3 Fatty Acids (FISH OIL PO) Take 1 capsule by mouth daily.    [provider]  oxyCODONE (OXY IR/ROXICODONE) 5 MG immediate release tablet Take 1-2 tablets (5-10 mg total) by mouth every 6 (six) hours as needed for moderate pain, severe pain or breakthrough pain. 03/24/17   [name] MiyamotoBlackman, Douglas, MD  ranitidine (ZANTAC) 150 MG tablet Take 150 mg by mouth daily.    [provider]  sertraline (ZOLOFT) 100 MG tablet Take 100 mg by mouth daily.    [provider]    Family History Family History  Problem Relation Age of Onset  . Congestive Heart Failure Sister   . Diabetes Mother   . Hypertension Mother   . Diabetes Father   . Hypertension Father     Social History Social History   Tobacco Use  . Smoking status: Current Every Day Smoker    Packs/day: 1.00    Years: 29.00    Pack years: 29.00    Types: Cigarettes  . Smokeless tobacco: Never Used  Substance Use Topics  . Alcohol use: No    Alcohol/week: 0.0 oz  . Drug use: No     Allergies   Omeprazole magnesium and Esomeprazole   Review of Systems Review of Systems  Constitutional: Positive for chills. Negative for fever.  HENT: Positive for congestion.   Respiratory: Positive for cough. Negative for chest tightness and shortness of breath.   Cardiovascular: Negative for chest pain, palpitations and leg swelling.  Gastrointestinal: Positive for nausea and vomiting. Negative for abdominal distention, abdominal pain, constipation and diarrhea.  Genitourinary: Negative for dysuria, frequency, hematuria and urgency.  Musculoskeletal: Negative for arthralgias, myalgias, neck pain  and neck stiffness.  Skin: Negative for rash.  Allergic/Immunologic: Negative for immunocompromised state.  Neurological: Negative for dizziness, weakness, light-headedness, numbness and headaches.  All other systems reviewed and are negative.    Physical Exam Updated Vital Signs BP (!) 137/121   Pulse (!) 118   Temp 99.4 F (37.4 C) (Oral)   Resp (!) 21   Ht 5\' 8"  (1.727 m)   Wt 80.7 kg (178 lb)   SpO2 100%   BMI 27.06 kg/m   Physical Exam  Constitutional: He appears well-developed and well-nourished. No distress.  HENT:  Head: Normocephalic and atraumatic.  Eyes: Conjunctivae are normal.  Neck: Neck supple.  Cardiovascular: Regular rhythm and normal heart sounds.  tachycardic  Pulmonary/Chest: Effort normal. No respiratory distress. He has no wheezes. He has no rales.  Abdominal: Soft. Bowel sounds are normal. He exhibits no distension. There is no tenderness. There is  no rebound.  Musculoskeletal: He exhibits no edema.  Neurological: He is alert.  Skin: Skin is warm and dry.  Nursing note and vitals reviewed.    ED Treatments / Results  Labs (all labs ordered are listed, but only abnormal results are displayed) Labs Reviewed  COMPREHENSIVE METABOLIC PANEL - Abnormal; Notable for the following components:      Result Value   Sodium 132 (*)    Chloride 97 (*)    CO2 17 (*)    Glucose, Bld 376 (*)    Creatinine, Ser 1.57 (*)    Total Bilirubin 2.0 (*)    GFR calc non Af Amer 51 (*)    GFR calc Af Amer 59 (*)    Anion gap 18 (*)    All other components within normal limits  CBC - Abnormal; Notable for the following components:   WBC 19.3 (*)    All other components within normal limits  URINALYSIS, ROUTINE W REFLEX MICROSCOPIC - Abnormal; Notable for the following components:   Glucose, UA >=500 (*)    Hgb urine dipstick MODERATE (*)    Ketones, ur 80 (*)    Protein, ur 100 (*)    Squamous Epithelial / LPF 0-5 (*)    All other components within normal  limits  DIFFERENTIAL - Abnormal; Notable for the following components:   Neutro Abs 17.5 (*)    All other components within normal limits  LIPASE, BLOOD  INFLUENZA PANEL BY PCR (TYPE A & B)  PROTIME-INR  APTT    EKG  EKG Interpretation None       Radiology Dg Chest 2 View  Result Date: 05/24/2017 CLINICAL DATA:  Body aches and emesis EXAM: CHEST  2 VIEW COMPARISON:  Chest radiograph 11/23/2015 FINDINGS: The heart size and mediastinal contours are within normal limits. Both lungs are clear. The visualized skeletal structures are unremarkable. IMPRESSION: No active cardiopulmonary disease. Electronically Signed   By: Deatra Robinson M.D.   On: 05/24/2017 02:31    Procedures Procedures (including critical care time)  Medications Ordered in ED Medications  dextrose 5 %-0.45 % sodium chloride infusion (not administered)  insulin regular bolus via infusion 0-10 Units (not administered)  insulin regular (NOVOLIN R,HUMULIN R) 100 Units in sodium chloride 0.9 % 100 mL (1 Units/mL) infusion (not administered)  dextrose 50 % solution 25 mL (not administered)  0.9 %  sodium chloride infusion (not administered)  ondansetron (ZOFRAN-ODT) disintegrating tablet 4 mg (4 mg Oral Given 05/23/17 2117)  sodium chloride 0.9 % bolus 1,000 mL (1,000 mLs Intravenous New Bag/Given 05/24/17 0210)  ondansetron (ZOFRAN) injection 4 mg (4 mg Intravenous Given 05/24/17 0210)  sodium chloride 0.9 % bolus 1,000 mL (1,000 mLs Intravenous New Bag/Given 05/24/17 0210)     Initial Impression / Assessment and Plan / ED Course  I have reviewed the triage vital signs and the nursing notes.  Pertinent labs & imaging results that were available during my care of the patient were reviewed by me and considered in my medical decision making (see chart for details).     Patient in emergency department with nausea, vomiting, hematic emesis.  Patient is tachycardic with heart rate of 122.  Patient is afebrile.   Patient's labs already obtained in triage showing 80 ketones in urine, hyperglycemia with blood sugar of 376, anion gap of 18.  Creatinine also bumped to 1.57.  Will start IV fluids, will start insulin drip.  Suspect DKA.  Patient's exam is otherwise unremarkable, his abdomen  is soft, with only mild tenderness in the epigastric area.  There is no guarding or rebound tenderness.  Patient is afebrile.  Normal blood pressure.  Normal oxygen saturation.  Chest x-ray obtained for cough.  2:39 AM Chest x-ray is negative.  Will admit for DKA.  Patient is allergic to PPI, did not order Protonix for his possible GI bleed.  Will get PT and PTT.  Hemoglobin is normal at this time.  Spoke with triad, will admit. Asked for trop. Will add.   Told by RN pt still tachycardic and diaphoretic. Pt has not had any of his meds for for two days. question withdraw from opioids and benzos. Pt is alert and oriented on my reassessment. Afebrile rectally. No complaints. No more episodes of hematoemsis in ed. Pt admitted.   Vitals:   05/24/17 0609 05/24/17 0644  BP:  (!) 179/87  Pulse:  (!) 108  Resp:  (!) 21  Temp: 99.6 F (37.6 C) 98.3 F (36.8 C)  SpO2:  98%    Final Clinical Impressions(s) / ED Diagnoses   Final diagnoses:  Diabetic ketoacidosis without coma associated with other specified diabetes mellitus [facility](HCC)    ED Discharge Orders    None       Jaynie CrumbleKirichenko, Lenardo Westwood, PA-C 05/24/17 0746    Derwood KaplanNanavati, Ankit, MD 05/24/17 [phone]

## 2017-05-24 NOTE — ED Notes (Signed)
PA at bedside,  

## 2017-05-24 NOTE — H&P (Addendum)
History and Physical    [name] PettyDanny L Kazmi VFI:[id number] DOB: 03/21/1971 DOA: 05/24/2017  PCP: Lindaann PascalLong, Scott, PA-C  Patient coming from: Home.  Chief Complaint: Nausea and vomiting.  HPI: [name] [name] is a 46 y.o. male with history of diabetes mellitus type 2, hypertension, hyperlipidemia, depression presents to the ER with complaints of nausea vomiting.  Patient has been having nausea vomiting for last 24 hours.  With abdominal cramps.  Denies any diarrhea.  Patient has had recent surgery for pilonidal sinus last month.  Patient denies any recent sick contacts or recent travel.  Denies any diarrhea had a normal bowel movement yesterday morning.  ED Course: In the ER patient was found to be tachycardic and labs reveal anion gap of 18 and blood sugar of 376 with bicarb of 17.  While waiting in the ER patient became diaphoretic.  Also had some chest pain after throwing up.  Otherwise chest pain-free.  Patient was started on IV fluid bolus and IV insulin drip for insulin diabetic ketoacidosis.  EKG shows sinus tachycardia chest x-ray unremarkable flu panel was negative and UA was not showing any signs of infection.  Review of Systems: As per HPI, rest all negative.   Past Medical History:  Diagnosis Date  . Atypical chest pain   . Diabetes mellitus without complication (HCC)   . Hyperlipemia   . Hypertension   . Malaise and fatigue     Past Surgical History:  Procedure Laterality Date  . ANKLE SURGERY  1982  . APPENDECTOMY  1975  . CARDIAC CATHETERIZATION N/A 03/28/2015   Procedure: Left Heart Cath and Coronary Angiography;  Surgeon: Yates DecampJay Ganji, MD;  [facility];  Service: Cardiovascular;  Laterality: N/A;  . CARDIAC CATHETERIZATION N/A 03/28/2015   Procedure: Intravascular Pressure Wire/FFR Study;  Surgeon: Yates DecampJay Ganji, MD;  [facility];  Service: Cardiovascular;  Laterality: N/A;  . CARPAL TUNNEL RELEASE Left   . ELBOW FRACTURE SURGERY Left   .  PILONIDAL CYST EXCISION N/A 03/24/2017   Procedure: EXCISION CHRONIC  PILONIDAL ABSCESS;  Surgeon: [name] MiyamotoBlackman, Douglas, MD;  Location: [facility] ORS;  Service: General;  Laterality: N/A;     reports that he has been smoking cigarettes.  He has a 29.00 pack-year smoking history. he has never used smokeless tobacco. He reports that he does not drink alcohol or use drugs.  Allergies  Allergen Reactions  . Omeprazole Magnesium Swelling  . Esomeprazole Other (See Comments)    Facial swelling    Family History  Problem Relation Age of Onset  . Congestive Heart Failure Sister   . Diabetes Mother   . Hypertension Mother   . Diabetes Father   . Hypertension Father     Prior to Admission medications   Medication Sig Start Date End Date Taking? Authorizing Provider  ALPRAZolam Prudy Feeler(XANAX) 1 MG tablet Take 1 mg by mouth at bedtime as needed for anxiety or sleep.    [provider]  aspirin 81 MG chewable tablet Chew 81 mg by mouth at bedtime.     [provider]  atorvastatin (LIPITOR) 40 MG tablet Take 40 mg by mouth daily.  07/29/14   [provider]  azelastine (OPTIVAR) 0.05 % ophthalmic solution Place 1 drop into both eyes 2 (two) times daily.    [provider]  buPROPion (WELLBUTRIN SR) 100 MG 12 hr tablet Take 100-300 mg by mouth 2 (two) times daily. Takes one tablet in the morning and three at night.  [provider]  glimepiride (AMARYL) 4 MG tablet Take 4 mg by mouth daily with breakfast.    [provider]  INFLUENZA VAC SPLIT QUAD ID Fluzone Quad 2018-19(PF) 60 mcg(15 mcgx4)/0.5 mL intramuscular syringe  TO BE ADMINISTERED BY PHARMACIST FOR IMMUNIZATION    [provider]  Insulin Degludec (TRESIBA FLEXTOUCH) 200 UNIT/ML SOPN Inject 40 Units into the skin at bedtime.     [provider]  insulin lispro (HUMALOG KWIKPEN) 100 UNIT/ML KiwkPen Inject 4 Units into the skin every evening.    [provider]  losartan  (COZAAR) 100 MG tablet Take 100 mg by mouth daily.    [provider]  metFORMIN (GLUCOPHAGE) 500 MG tablet Take 1 tablet (500 mg total) by mouth 2 (two) times daily with a meal. 12/01/15   Richarda OverlieAbrol, Nayana, MD  metoprolol succinate (TOPROL-XL) 50 MG 24 hr tablet Take 50 mg by mouth at bedtime.  10/28/15   [provider]  multivitamin-lutein (OCUVITE-LUTEIN) CAPS capsule Take 1 capsule by mouth daily.    [provider]  Omega-3 Fatty Acids (FISH OIL PO) Take 1 capsule by mouth daily.    [provider]  oxyCODONE (OXY IR/ROXICODONE) 5 MG immediate release tablet Take 1-2 tablets (5-10 mg total) by mouth every 6 (six) hours as needed for moderate pain, severe pain or breakthrough pain. 03/24/17   [name] MiyamotoBlackman, Douglas, MD  ranitidine (ZANTAC) 150 MG tablet Take 150 mg by mouth daily.    [provider]  sertraline (ZOLOFT) 100 MG tablet Take 100 mg by mouth daily.    [provider]    Physical Exam: Vitals:   05/24/17 0215 05/24/17 0233 05/24/17 0234 05/24/17 0245  BP:  (!) 197/114  (!) 191/126  Pulse: (!) 115 (!) 118 (!) 118 (!) 116  Resp: (!) 23 18 (!) 21 15  Temp:      TempSrc:      SpO2: 100% 95% 100% 99%  Weight:      Height:          Constitutional: Moderately built and nourished. Vitals:   05/24/17 0215 05/24/17 0233 05/24/17 0234 05/24/17 0245  BP:  (!) 197/114  (!) 191/126  Pulse: (!) 115 (!) 118 (!) 118 (!) 116  Resp: (!) 23 18 (!) 21 15  Temp:      TempSrc:      SpO2: 100% 95% 100% 99%  Weight:      Height:       Eyes: Anicteric no pallor. ENMT: No discharge from the ears eyes nose or mouth. Neck: No mass felt.  No neck rigidity. Respiratory: No rhonchi or crepitations. Cardiovascular: S1-S2 heard no murmurs appreciated. Abdomen: Soft nontender bowel sounds present. Musculoskeletal: No edema or joint effusion. Skin: No obvious rash. Neurologic: Alert awake oriented to time place and person.  Moves all  extremity's. Psychiatric: Appears normal.  Normal affect.   Labs on Admission: I have personally reviewed following labs and imaging studies  CBC: Recent Labs  Lab 05/23/17 2103  WBC 19.3*  NEUTROABS 17.5*  HGB 15.8  HCT 45.2  MCV 91.3  PLT 260   Basic Metabolic Panel: Recent Labs  Lab 05/23/17 2103  NA 132*  K 4.7  CL 97*  CO2 17*  GLUCOSE 376*  BUN 18  CREATININE 1.57*  CALCIUM 9.1   GFR: Estimated Creatinine Clearance: 56.3 mL/min (A) (by C-G formula based on SCr of 1.57 mg/dL (H)). Liver Function Tests: Recent Labs  Lab 05/23/17 2103  AST  23  ALT 22  ALKPHOS 120  BILITOT 2.0*  PROT 7.6  ALBUMIN 3.7   Recent Labs  Lab 05/23/17 2103  LIPASE 18   No results for input(s): AMMONIA in the last 168 hours. Coagulation Profile: Recent Labs  Lab 05/24/17 0239  INR 1.10   Cardiac Enzymes: No results for input(s): CKTOTAL, CKMB, CKMBINDEX, TROPONINI in the last 168 hours. BNP (last 3 results) No results for input(s): PROBNP in the last 8760 hours. HbA1C: No results for input(s): HGBA1C in the last 72 hours. CBG: Recent Labs  Lab 05/24/17 0316  GLUCAP 368*   Lipid Profile: No results for input(s): CHOL, HDL, LDLCALC, TRIG, CHOLHDL, LDLDIRECT in the last 72 hours. Thyroid Function Tests: No results for input(s): TSH, T4TOTAL, FREET4, T3FREE, THYROIDAB in the last 72 hours. Anemia Panel: No results for input(s): VITAMINB12, FOLATE, FERRITIN, TIBC, IRON, RETICCTPCT in the last 72 hours. Urine analysis:    Component Value Date/Time   COLORURINE YELLOW 05/23/2017 2118   APPEARANCEUR CLEAR 05/23/2017 2118   LABSPEC 1.027 05/23/2017 2118   PHURINE 5.0 05/23/2017 2118   GLUCOSEU >=500 (A) 05/23/2017 2118   HGBUR MODERATE (A) 05/23/2017 2118   BILIRUBINUR NEGATIVE 05/23/2017 2118   KETONESUR 80 (A) 05/23/2017 2118   PROTEINUR 100 (A) 05/23/2017 2118   NITRITE NEGATIVE 05/23/2017 2118   LEUKOCYTESUR NEGATIVE 05/23/2017 2118   Sepsis  Labs: @LABRCNTIP (procalcitonin:4,lacticidven:4) )No results found for this or any previous visit (from the past 240 hour(s)).   Radiological Exams on Admission: Dg Chest 2 View  Result Date: 05/24/2017 CLINICAL DATA:  Body aches and emesis EXAM: CHEST  2 VIEW COMPARISON:  Chest radiograph 11/23/2015 FINDINGS: The heart size and mediastinal contours are within normal limits. Both lungs are clear. The visualized skeletal structures are unremarkable. IMPRESSION: No active cardiopulmonary disease. Electronically Signed   By: Deatra Robinson M.D.   On: 05/24/2017 02:31    EKG: Independently reviewed.  Sinus tachycardia.  Assessment/Plan Principal Problem:   DKA (diabetic ketoacidoses) (HCC) Active Problems:   Intractable nausea and vomiting   Essential hypertension    1. Diabetic ketoacidosis -precipitating cause not clear.  Patient is on IV insulin drip and fluids.  Closely follow metabolic panel for correction of anion gap.  If nausea vomiting improves and anion gap gets corrected change to subcutaneous insulin.  Patient states he has been recently being titrated for his insulin doses by his PCP and endocrinologist.  He does not recall his last hemoglobin A1c. 2. Nausea vomiting -abdomen appears benign LFTs and lipase are normal.  Since patient is having diaphoresis I have ordered sonogram of the abdomen.  Nausea vomiting could be either from gastroparesis or viral gastroenteritis.  Has not had any diarrhea.  The patient did have some blood-tinged vomitus towards the end of which I am going to order repeat CBC.  Patient is allergic to PPI. 3. Diaphoresis -patient was breaking into sweats in the ER.  I have ordered blood cultures lactic acid pro calcitonin levels and troponin and TSH.  Since patient has had recent with pilonidal surgery we have to consider intra-abdominal source of possible infection. 4. Hypertension uncontrolled -I have placed patient on scheduled dose of IV metoprolol since  patient is n.p.o. and as needed IV hydralazine.  Closely follow blood pressure trends. 5. Chronic kidney disease stage III -creatinine appears to be at baseline.   DVT prophylaxis: Lovenox. Code Status: Full code. Family Communication: Discussed with patient. Disposition Plan: Home. Consults called: None. Admission status: Inpatient.  Eduard Clos MD [facility] Pager [phone].  If 7PM-7AM, please contact night-coverage [email or web address] Password [email or web address]  05/24/2017, 3:50 AM

## 2017-05-24 NOTE — Progress Notes (Signed)
[facility] PROGRESS NOTE  Rennis PettyDanny L Clapp ZOX:[id number] DOB: [birth year] DOA: 05/24/2017  PCP: Lindaann PascalLong, Scott, PA-C  Brief History/Interval Summary: 46 year old Caucasian male with a past medical history of insulin-dependent diabetes mellitus, hypertension, hyperlipidemia depression presented with complains of nausea and vomiting.  He was thought to be in diabetic ketoacidosis.  He mentioned emesis of blood after multiple episodes of vomiting.  He was noted to be tachycardic.  He was diaphoretic.  No obvious source of infection is found.  He has been hospitalized for further management.  Reason for Visit: Diabetic ketoacidosis.  Consultants: None  Procedures: None  Antibiotics: None  Subjective/Interval History: Patient states that he is feeling better this morning.  Has had some nausea with gagging but no emesis.  Denies any abdominal pain.  No cough.  No diarrhea.  No joint pains.  No skin rashes.  ROS: Denies any headaches.  Objective:  Vital Signs  Vitals:   05/24/17 0607 05/24/17 0609 05/24/17 0644 05/24/17 0749  BP:   (!) 179/87 (!) 182/104  Pulse: (!) 106  (!) 108 (!) 104  Resp: 17  (!) 21 15  Temp:  99.6 F (37.6 C) 98.3 F (36.8 C) 99 F (37.2 C)  TempSrc:  Rectal Oral Oral  SpO2: 98%  98% 97%  Weight:   78.5 kg (173 lb 1 oz)   Height:   5\' 8"  (1.727 m)     Intake/Output Summary (Last 24 hours) at 05/24/2017 1052 Last data filed at 05/24/2017 1000 Gross per 24 hour  Intake 380 ml  Output 700 ml  Net -320 ml   Filed Weights   05/23/17 2106 05/24/17 0644  Weight: 80.7 kg (178 lb) 78.5 kg (173 lb 1 oz)    General appearance: alert, cooperative, appears stated age and no distress Head: Normocephalic, without obvious abnormality, atraumatic Throat: Dry mucous membranes noted. Resp: Diminished air entry at the bases.  No crackles wheezing or rhonchi.  Normal effort. Cardio: S1-S2 is mildly tachycardic.  No S3-S4.  No rubs murmurs of bruit.  No pedal  edema. GI: soft, non-tender; bowel sounds normal; no masses,  no organomegaly Extremities: extremities normal, atraumatic, no cyanosis or edema Pulses: 2+ and symmetric Neurologic: Awake alert.  No facial asymmetry.  Motor strength is equal bilateral upper and lower extremities.  Lab Results:  Data Reviewed: I have personally reviewed following labs and imaging studies  CBC: Recent Labs  Lab 05/23/17 2103 05/24/17 0352  WBC 19.3* 14.9*  NEUTROABS 17.5* 13.0*  HGB 15.8 14.9  HCT 45.2 43.5  MCV 91.3 91.8  PLT 260 245    Basic Metabolic Panel: Recent Labs  Lab 05/23/17 2103 05/24/17 0352  NA 132* 137  K 4.7 4.3  CL 97* 104  CO2 17* 17*  GLUCOSE 376* 308*  BUN 18 21*  CREATININE 1.57* 1.34*  CALCIUM 9.1 8.4*    GFR: Estimated Creatinine Clearance: 65.9 mL/min (A) (by C-G formula based on SCr of 1.34 mg/dL (H)).  Liver Function Tests: Recent Labs  Lab 05/23/17 2103 05/24/17 0352  AST 23 18  ALT 22 19  ALKPHOS 120 108  BILITOT 2.0* 1.7*  PROT 7.6 7.2  ALBUMIN 3.7 3.3*    Recent Labs  Lab 05/23/17 2103  LIPASE 18    Coagulation Profile: Recent Labs  Lab 05/24/17 0239  INR 1.10    Cardiac Enzymes: Recent Labs  Lab 05/24/17 0352  TROPONINI <0.03    CBG: Recent Labs  Lab 05/24/17 0543 05/24/17 [id number] 05/24/17 0802  05/24/17 0916 05/24/17 1021  GLUCAP 269* 207* 172* 163* 184*     Recent Results (from the past 240 hour(s))  MRSA PCR Screening     Status: None   Collection Time: 05/24/17  6:59 AM  Result Value Ref Range Status   MRSA by PCR NEGATIVE NEGATIVE Final    Comment:        The GeneXpert MRSA Assay (FDA approved for NASAL specimens only), is one component of a comprehensive MRSA colonization surveillance program. It is not intended to diagnose MRSA infection nor to guide or monitor treatment for MRSA infections.       Radiology Studies: Dg Chest 2 View  Result Date: 05/24/2017 CLINICAL DATA:  Body aches and emesis  EXAM: CHEST  2 VIEW COMPARISON:  Chest radiograph 11/23/2015 FINDINGS: The heart size and mediastinal contours are within normal limits. Both lungs are clear. The visualized skeletal structures are unremarkable. IMPRESSION: No active cardiopulmonary disease. Electronically Signed   By: Deatra RobinsonKevin  Herman M.D.   On: 05/24/2017 02:31   [place] Abdomen Complete  Result Date: 05/24/2017 CLINICAL DATA:  Nausea, vomiting EXAM: ABDOMEN ULTRASOUND COMPLETE COMPARISON:  10/15/2016 FINDINGS: Gallbladder: No gallstones or wall thickening visualized. No sonographic Murphy sign noted by sonographer. Common bile duct: Diameter: Normal caliber, 3 mm Liver: Increased echotexture compatible with fatty infiltration. No focal abnormality or biliary ductal dilatation. Portal vein is patent on color Doppler imaging with normal direction of blood flow towards the liver. IVC: No abnormality visualized. Pancreas: Not well visualized due to overlying bowel gas. Spleen: Size and appearance within normal limits. Right Kidney: Length: 11.4 cm. Echogenicity within normal limits. No mass or hydronephrosis visualized. Left Kidney: Length: 11.7 cm. Echogenicity within normal limits. No mass or hydronephrosis visualized. Abdominal aorta: Not well visualized due to overlying bowel gas. Other findings: None. IMPRESSION: No acute findings. Electronically Signed   By: Charlett NoseKevin  Dover M.D.   On: 05/24/2017 08:58     Medications:  Scheduled: . metoprolol tartrate  5 mg Intravenous Q8H   Continuous: . sodium chloride    . dextrose 5 % and 0.45% NaCl 125 mL/hr at 05/24/17 0800  . famotidine (PEPCID) IV    . insulin (NOVOLIN-R) infusion 5 Units/hr (05/24/17 1000)   ZOX:[id number]:acetaminophen, alum & mag hydroxide-simeth, ondansetron (ZOFRAN) IV  Assessment/Plan:  Principal Problem:   DKA (diabetic ketoacidoses) (HCC) Active Problems:   Intractable nausea and vomiting   Essential hypertension    Diabetic ketoacidosis Precipitating cause is not  entirely clear.  Patient has been initiated on the DKA protocol.  He is on insulin infusion and IV fluids.  Labs are being checked frequently.  HbA1c is pending.  Transition to subcutaneous insulin once anion gap closes and symptoms resolved.  At home patient takes NovoLog on sliding scale.  He is on [place] for basal control.  Also noted to be on metformin.  Nausea and vomiting with one episode of hematemesis Hematemesis suggestive of a Mallory-Weiss tear.  Has not had any further episodes since he has been in the stepdown unit.  Symptomatic treatment.  He is allergic to PPI which caused facial swelling.  He has tolerated Pepcid which will be initiated.  Monitor hemoglobin.  Abdomen is benign at this time.  Diaphoresis with leukocytosis No obvious source of infection is noted.  Continue to monitor off of antibiotics.  Cultures are pending.  Lactic acid level is pending.  Accelerated hypertension IV metoprolol.  IV hydralazine as needed.  Patient appears to be asymptomatic.  Monitor closely.  Chronic kidney disease stage III Creatinine appears to be at baseline.  Continue to monitor urine output.   DVT Prophylaxis: SCD's    Code Status: Full code Family Communication: Discussed with the patient Disposition Plan: Management as outlined above.    LOS: 0 days   Osvaldo Shipper  [facility] Pager [phone] 05/24/2017, 10:52 AM  If 7PM-7AM, please contact night-coverage at [email or web address], password [facility]

## 2017-05-24 NOTE — ED Notes (Signed)
Pt states nurse just got blood from him a few minutes ago.  Labs for this time still pending in [facility]

## 2017-05-24 NOTE — ED Notes (Signed)
Spoke with admitting advised to give Metoprolol  And will placed additional orders;

## 2017-05-25 DIAGNOSIS — E1110 Type 2 diabetes mellitus with ketoacidosis without coma: Secondary | ICD-10-CM | POA: Diagnosis not present

## 2017-05-25 LAB — CBC
HCT: 42.8 % (ref 39.0–52.0)
HEMOGLOBIN: 14.4 g/dL (ref 13.0–17.0)
MCH: 31.3 pg (ref 26.0–34.0)
MCHC: 33.6 g/dL (ref 30.0–36.0)
MCV: 93 fL (ref 78.0–100.0)
PLATELETS: 238 10*3/uL (ref 150–400)
RBC: 4.6 MIL/uL (ref 4.22–5.81)
RDW: 12.9 % (ref 11.5–15.5)
WBC: 13 10*3/uL — ABNORMAL HIGH (ref 4.0–10.5)

## 2017-05-25 LAB — GLUCOSE, CAPILLARY
GLUCOSE-CAPILLARY: 189 mg/dL — AB (ref 65–99)
Glucose-Capillary: 223 mg/dL — ABNORMAL HIGH (ref 65–99)

## 2017-05-25 LAB — BASIC METABOLIC PANEL
Anion gap: 7 (ref 5–15)
BUN: 11 mg/dL (ref 6–20)
CHLORIDE: 104 mmol/L (ref 101–111)
CO2: 24 mmol/L (ref 22–32)
CREATININE: 0.85 mg/dL (ref 0.61–1.24)
Calcium: 8.5 mg/dL — ABNORMAL LOW (ref 8.9–10.3)
Glucose, Bld: 188 mg/dL — ABNORMAL HIGH (ref 65–99)
POTASSIUM: 3.9 mmol/L (ref 3.5–5.1)
SODIUM: 135 mmol/L (ref 135–145)

## 2017-05-25 MED ORDER — BACITRACIN-NEOMYCIN-POLYMYXIN 400-5-5000 EX OINT: 1.0000 "application " | TOPICAL_OINTMENT | Freq: Three times a day (TID) | CUTANEOUS | 0 refills | Status: DC

## 2017-05-25 MED ORDER — INSULIN DEGLUDEC 200 UNIT/ML ~~LOC~~ SOPN: 6.0000 [IU] | PEN_INJECTOR | Freq: Every day | SUBCUTANEOUS | Status: DC

## 2017-05-25 NOTE — Plan of Care (Signed)
Pt denies pain/nausea and has slept most of the morning; A&Ox4; up to bathroom walking; tolerated ambulation & breakfast well; will continue to monitor.

## 2017-05-25 NOTE — Discharge Instructions (Signed)
Diabetic Ketoacidosis °Diabetic ketoacidosis is a life-threatening complication of diabetes. If it is not treated, it can cause severe dehydration and organ damage and can lead to a coma or death. °What are the causes? °This condition develops when there is not enough of the hormone insulin in the body. Insulin helps the body to break down sugar for energy. Without insulin, the body cannot break down sugar, so it breaks down fats instead. This leads to the production of acids that are called ketones. Ketones are poisonous at high levels. °This condition can be triggered by: °· Stress on the body that is brought on by an illness. °· Medicines that raise blood glucose levels. °· Not taking diabetes medicine. ° °What are the signs or symptoms? °Symptoms of this condition include: °· Fatigue. °· Weight loss. °· Excessive thirst. °· Light-headedness. °· Fruity or sweet-smelling breath. °· Excessive urination. °· Vision changes. °· Confusion or irritability. °· Nausea. °· Vomiting. °· Rapid breathing. °· Abdominal pain. °· Feeling flushed. ° °How is this diagnosed? °This condition is diagnosed based on a medical history, a physical exam, and blood tests. You may also have a urine test that checks for ketones. °How is this treated? °This condition may be treated with: °· Fluid replacement. This may be done to correct dehydration. °· Insulin injections. These may be given through the skin or through an IV tube. °· Electrolyte replacement. Electrolytes, such as potassium and sodium, may be given in pill form or through an IV tube. °· Antibiotic medicines. These may be prescribed if your condition was caused by an infection. ° °Follow these instructions at home: °Eating and drinking °· Drink enough fluids to keep your urine clear or pale yellow. °· If you cannot eat, alternate between drinking fluids with sugar (such as juice) and salty fluids (such as broth or bouillon). °· If you can eat, follow your usual diet and drink  sugar-free liquids, such as water. °Other Instructions ° °· Take insulin as directed by your health care provider. Do not skip insulin injections. Do not use expired insulin. °· If your blood sugar is over 240 mg/dL, monitor your urine ketones every 4-6 hours. °· If you were prescribed an antibiotic medicine, finish all of it even if you start to feel better. °· Rest and exercise only as directed by your health care provider. °· If you get sick, call your health care provider and begin treatment quickly. Your body often needs extra insulin to fight an illness. °· Check your blood glucose levels regularly. If your blood glucose is high, drink plenty of fluids. This helps to flush out ketones. °Contact a health care provider if: °· Your blood glucose level is too high or too low. °· You have ketones in your urine. °· You have a fever. °· You cannot eat. °· You cannot tolerate fluids. °· You have been vomiting for more than 2 hours. °· You continue to have symptoms of this condition. °· You develop new symptoms. °Get help right away if: °· Your blood glucose levels continue to be high (elevated). °· Your monitor reads “high” even when you are taking insulin. °· You faint. °· You have chest pain. °· You have trouble breathing. °· You have a sudden, severe headache. °· You have sudden weakness in one arm or one leg. °· You have sudden trouble speaking or swallowing. °· You have vomiting or diarrhea that gets worse after 3 hours. °· You feel severely fatigued. °· You have trouble thinking. °· You   have abdominal pain. °· You are severely dehydrated. Symptoms of severe dehydration include: °? Extreme thirst. °? Dry mouth. °? Blue lips. °? Cold hands and feet. °? Rapid breathing. °This information is not intended to replace advice given to you by your health care provider. Make sure you discuss any questions you have with your health care provider. °Document Released: 05/14/2000 Document Revised: 10/23/2015 Document  Reviewed: 04/24/2014 °Elsevier Interactive Patient Education © 2017 [employer] ° °

## 2017-05-25 NOTE — Discharge Summary (Signed)
[facility]  Physician Discharge Summary   Patient ID: [name] [name] MRN: [id number] DOB/AGE: 1971/04/23 46 y.o.  Admit date: 05/24/2017 Discharge date: 05/25/2017  PCP: Lindaann Pascal, PA-C  DISCHARGE DIAGNOSES:  Principal Problem:   DKA (diabetic ketoacidoses) (HCC) Active Problems:   Intractable nausea and vomiting   Essential hypertension   RECOMMENDATIONS FOR OUTPATIENT FOLLOW UP: 1. Close outpatient follow-up with PCP  DISCHARGE CONDITION: fair  Diet recommendation: Modified carbohydrate  Filed Weights   05/23/17 2106 05/24/17 0644  Weight: 80.7 kg (178 lb) 78.5 kg (173 lb 1 oz)    INITIAL HISTORY: 46 year old Caucasian male with a past medical history of insulin-dependent diabetes mellitus, hypertension, hyperlipidemia depression presented with complains of nausea and vomiting.  He was thought to be in diabetic ketoacidosis.  He mentioned emesis of blood after multiple episodes of vomiting.  He was noted to be tachycardic.  He was diaphoretic.  No obvious source of infection is found.  He has been hospitalized for further management.   HOSPITAL COURSE:   Diabetic ketoacidosis Precipitating cause is not entirely clear.  Patient initiated on the DKA protocol with insulin infusion and IV fluids. Patient's anion gap closed.  CBGs improved.  He was transitioned to Lantus insulin.   Patient asymptomatic.  Tolerating his diet.  At home patient takes NovoLog on sliding scale which he may continue.  He is on [place] for basal control.  The dose of this will be increased to 6 units. Also noted to be on metformin which he can continue as well.  HbA1c noted to be 8.7.  Nausea and vomiting with one episode of hematemesis Hematemesis suggestive of a Mallory-Weiss tear.  Has not had any further episodes since he has been in the stepdown unit.    Patient was given Pepcid.  His hemoglobin remained stable during this hospitalization.  He did not have any further episodes of  hematemesis.  Tolerating his diet.  Abdomen is benign.    Diaphoresis with leukocytosis No obvious source of infection is noted.  Cultures have been negative so far.  Symptoms most likely due to DKA.  Lactic acid level was normal.  Feels much better this morning.  Accelerated hypertension Patient was given parenteral agents.  Blood pressure is better controlled.  He may resume his home medication regimen.   Chronic kidney disease stage III Creatinine appears to be at baseline.    Recent surgery for Pilonidal cysts Mild yellowish exudate noted in the area.  No erythema.  No fluctuation.  Continue local antibiotic cream.  He has a follow-up appointment with a surgeon next week.  Overall stable.  Patient is ambulated.  Feels much better this morning.  Tolerated his diet.  CBGs are reasonably well controlled.  Okay for discharge home.   PERTINENT LABS:  The results of significant diagnostics from this hospitalization (including imaging, microbiology, ancillary and laboratory) are listed below for reference.    Microbiology: Recent Results (from the past 240 hour(s))  MRSA PCR Screening     Status: None   Collection Time: 05/24/17  6:59 AM  Result Value Ref Range Status   MRSA by PCR NEGATIVE NEGATIVE Final    Comment:        The GeneXpert MRSA Assay (FDA approved for NASAL specimens only), is one component of a comprehensive MRSA colonization surveillance program. It is not intended to diagnose MRSA infection nor to guide or monitor treatment for MRSA infections.   Culture, blood (routine x 2)  Status: None (Preliminary result)   Collection Time: 05/24/17 10:00 AM  Result Value Ref Range Status   Specimen Description BLOOD RIGHT FOREARM  Final   Special Requests   Final    BOTTLES DRAWN AEROBIC AND ANAEROBIC Blood Culture adequate volume   Culture NO GROWTH 1 DAY  Final   Report Status PENDING  Incomplete  Culture, blood (routine x 2)     Status: None (Preliminary  result)   Collection Time: 05/24/17 10:15 AM  Result Value Ref Range Status   Specimen Description BLOOD RIGHT HAND  Final   Special Requests   Final    BOTTLES DRAWN AEROBIC AND ANAEROBIC Blood Culture adequate volume   Culture NO GROWTH 1 DAY  Final   Report Status PENDING  Incomplete     Labs: Basic Metabolic Panel: Recent Labs  Lab 05/23/17 2103 05/24/17 0352 05/24/17 1000 05/25/17 0206  NA 132* 137 137 135  K 4.7 4.3 4.0 3.9  CL 97* 104 104 104  CO2 17* 17* 22 24  GLUCOSE 376* 308* 181* 188*  BUN 18 21* 17 11  CREATININE 1.57* 1.34* 0.92 0.85  CALCIUM 9.1 8.4* 8.4* 8.5*   Liver Function Tests: Recent Labs  Lab 05/23/17 2103 05/24/17 0352  AST 23 18  ALT 22 19  ALKPHOS 120 108  BILITOT 2.0* 1.7*  PROT 7.6 7.2  ALBUMIN 3.7 3.3*   Recent Labs  Lab 05/23/17 2103  LIPASE 18   CBC: Recent Labs  Lab 05/23/17 2103 05/24/17 0352 05/25/17 0206  WBC 19.3* 14.9* 13.0*  NEUTROABS 17.5* 13.0*  --   HGB 15.8 14.9 14.4  HCT 45.2 43.5 42.8  MCV 91.3 91.8 93.0  PLT 260 245 238   Cardiac Enzymes: Recent Labs  Lab 05/24/17 0352 05/24/17 1000 05/24/17 1450  TROPONINI <0.03 <0.03 <0.03    CBG: Recent Labs  Lab 05/24/17 1444 05/24/17 1631 05/24/17 2108 05/25/17 0618 05/25/17 1242  GLUCAP 130* 213* 204* 189* 223*     IMAGING STUDIES Dg Chest 2 View  Result Date: 05/24/2017 CLINICAL DATA:  Body aches and emesis EXAM: CHEST  2 VIEW COMPARISON:  Chest radiograph 11/23/2015 FINDINGS: The heart size and mediastinal contours are within normal limits. Both lungs are clear. The visualized skeletal structures are unremarkable. IMPRESSION: No active cardiopulmonary disease. Electronically Signed   By: Deatra RobinsonKevin  Herman M.D.   On: 05/24/2017 02:31   [place] Abdomen Complete  Result Date: 05/24/2017 CLINICAL DATA:  Nausea, vomiting EXAM: ABDOMEN ULTRASOUND COMPLETE COMPARISON:  10/15/2016 FINDINGS: Gallbladder: No gallstones or wall thickening visualized. No sonographic  Murphy sign noted by sonographer. Common bile duct: Diameter: Normal caliber, 3 mm Liver: Increased echotexture compatible with fatty infiltration. No focal abnormality or biliary ductal dilatation. Portal vein is patent on color Doppler imaging with normal direction of blood flow towards the liver. IVC: No abnormality visualized. Pancreas: Not well visualized due to overlying bowel gas. Spleen: Size and appearance within normal limits. Right Kidney: Length: 11.4 cm. Echogenicity within normal limits. No mass or hydronephrosis visualized. Left Kidney: Length: 11.7 cm. Echogenicity within normal limits. No mass or hydronephrosis visualized. Abdominal aorta: Not well visualized due to overlying bowel gas. Other findings: None. IMPRESSION: No acute findings. Electronically Signed   By: Charlett NoseKevin  Dover M.D.   On: 05/24/2017 08:58    DISCHARGE EXAMINATION: Vitals:   05/24/17 2312 05/25/17 0422 05/25/17 0753 05/25/17 1158  BP: (!) 154/98 (!) 160/89 (!) 150/95 (!) 145/95  Pulse: 92 92 85 93  Resp: (!) 21 (!)  22  18  Temp: 98.9 F (37.2 C) 98.5 F (36.9 C) 98.8 F (37.1 C) (!) 96.9 F (36.1 C)  TempSrc: Oral Oral Oral Axillary  SpO2: 96% 98% 97% 98%  Weight:      Height:       General appearance: alert, cooperative, appears stated age and no distress Resp: clear to auscultation bilaterally Cardio: regular rate and rhythm, S1, S2 normal, no murmur, click, rub or gallop GI: soft, non-tender; bowel sounds normal; no masses,  no organomegaly Back was examined. Patient with recent surgery for pilonidal cyst. Yellow exudate noted. No erythema, Non tender. No swelling.  DISPOSITION: Home  Discharge Instructions    Call MD for:  difficulty breathing, headache or visual disturbances   Complete by:  As directed    Call MD for:  extreme fatigue   Complete by:  As directed    Call MD for:  persistant dizziness or light-headedness   Complete by:  As directed    Call MD for:  persistant nausea and vomiting    Complete by:  As directed    Call MD for:  severe uncontrolled pain   Complete by:  As directed    Call MD for:  temperature >100.4   Complete by:  As directed    Diet Carb Modified   Complete by:  As directed    Discharge instructions   Complete by:  As directed    Please be sure to follow up with your PCP next week.  You were cared for by a hospitalist during your hospital stay. If you have any questions about your discharge medications or the care you received while you were in the hospital after you are discharged, you can call the unit and asked to speak with the hospitalist on call if the hospitalist that took care of you is not available. Once you are discharged, your primary care physician will handle any further medical issues. Please note that NO REFILLS for any discharge medications will be authorized once you are discharged, as it is imperative that you return to your primary care physician (or establish a relationship with a primary care physician if you do not have one) for your aftercare needs so that they can reassess your need for medications and monitor your lab values. If you do not have a primary care physician, you can call [phone] for a physician referral.   Increase activity slowly   Complete by:  As directed         Allergies as of 05/25/2017      Reactions   Omeprazole Magnesium Swelling   Esomeprazole Other (See Comments)   Facial swelling      Medication List    STOP taking these medications   INFLUENZA VAC SPLIT QUAD ID     TAKE these medications   ALPRAZolam 1 MG tablet Commonly known as:  XANAX Take 1 mg by mouth at bedtime as needed for anxiety or sleep.   aspirin 81 MG chewable tablet Chew 81 mg by mouth at bedtime.   atorvastatin 40 MG tablet Commonly known as:  LIPITOR Take 40 mg by mouth daily.   azelastine 0.05 % ophthalmic solution Commonly known as:  OPTIVAR Place 1 drop into both eyes 2 (two) times daily.   buPROPion 100 MG 12  hr tablet Commonly known as:  WELLBUTRIN SR Take 100 mg by mouth 2 (two) times daily. Takes one tablet in the morning and three at night.   FISH OIL PO  Take 1 capsule by mouth daily.   glimepiride 4 MG tablet Commonly known as:  AMARYL Take 4 mg by mouth daily with breakfast.   HUMALOG KWIKPEN 100 UNIT/ML KiwkPen Generic drug:  insulin lispro Inject 4-6 Units into the skin every evening. Over 100 = 2 units Over 200 =4 units Over 300 =6 units   Insulin Degludec 200 UNIT/ML Sopn Commonly known as:  TRESIBA FLEXTOUCH Inject 6 Units into the skin at bedtime. What changed:  how much to take   losartan 100 MG tablet Commonly known as:  COZAAR Take 100 mg by mouth daily.   metFORMIN 500 MG tablet Commonly known as:  GLUCOPHAGE Take 1 tablet (500 mg total) by mouth 2 (two) times daily with a meal.   metoprolol succinate 50 MG 24 hr tablet Commonly known as:  TOPROL-XL Take 50 mg by mouth daily.   multivitamin-lutein Caps capsule Take 1 capsule by mouth daily.   neomycin-bacitracin-polymyxin ointment Commonly known as:  NEOSPORIN Apply 1 application topically 3 (three) times daily. To the surgical site on your back   oxyCODONE 5 MG immediate release tablet Commonly known as:  Oxy IR/ROXICODONE Take 1-2 tablets (5-10 mg total) by mouth every 6 (six) hours as needed for moderate pain, severe pain or breakthrough pain.   ranitidine 150 MG tablet Commonly known as:  ZANTAC Take 150 mg by mouth daily.   sertraline 100 MG tablet Commonly known as:  ZOLOFT Take 200 mg by mouth daily.        Follow-up Information    Long, [name], [place]. Schedule an appointment as soon as possible for a visit in 1 week(s).   Specialty:  Physician Assistant Contact information: [address] [place] [address] [phone]           TOTAL DISCHARGE TIME: 35 mins  Osvaldo Shipper  [facility] Pager [phone]  05/25/2017, 3:15 PM

## 2017-05-25 NOTE — Progress Notes (Signed)
Nutrition Brief Note  Patient identified on the Malnutrition Screening Tool (MST) Report  Wt Readings from Last 15 Encounters:  05/24/17 173 lb 1 oz (78.5 kg)  03/24/17 190 lb (86.2 kg)  03/22/17 190 lb (86.2 kg)  12/29/16 178 lb 14.4 oz (81.1 kg)  12/28/16 177 lb (80.3 kg)  12/13/16 176 lb 1.6 oz (79.9 kg)  10/15/16 170 lb (77.1 kg)  10/13/16 170 lb (77.1 kg)  11/26/15 173 lb 11.2 oz (78.8 kg)  11/19/15 181 lb (82.1 kg)  11/18/15 183 lb (83 kg)  09/03/15 188 lb 9.6 oz (85.5 kg)  08/20/15 189 lb 6.4 oz (85.9 kg)  03/28/15 194 lb (88 kg)  09/30/14 183 lb (83 kg)   Spoke with pt, reports pt is feeling 100% better and his N/V has resolved.   Body mass index is 26.31 kg/m. Patient meets criteria for overweight based on current BMI. Pt unsure of weight changes and reports a UBW of 175 lbs.   Current diet order is carb modified, patient is consuming approximately 90% of meals at this time. Pt reports he did not consume the full meal because he did not like the taste of the oatmeal.   Pt reports disliking Ensure supplementation, RD to discontinue.   Labs and medications reviewed.   No nutrition interventions warranted at this time. If nutrition issues arise, please consult RD.   Fransisca KaufmannAllison Ioannides, MS, RDN, LDN 05/25/2017 1:28 PM

## 2017-05-25 NOTE — Progress Notes (Signed)
Pt's IV's removed; educated on d/c instructions; told to collect belongings; told to call RN once ride here to take pt out in wheelchair. Will continue to monitor.   [place]  Anab Vivar, RN

## 2017-05-27 ENCOUNTER — Encounter (HOSPITAL_COMMUNITY): Payer: Self-pay | Admitting: Emergency Medicine

## 2017-05-27 ENCOUNTER — Other Ambulatory Visit: Payer: Self-pay

## 2017-05-27 ENCOUNTER — Observation Stay (HOSPITAL_COMMUNITY)
Admission: EM | Admit: 2017-05-27 | Discharge: 2017-05-28 | Disposition: A | Discharge status: Home / Self Care | Payer: 59 | Source: Home / Self Care | Attending: Emergency Medicine | Admitting: Emergency Medicine

## 2017-05-27 DIAGNOSIS — Z720 Tobacco use: Secondary | ICD-10-CM | POA: Diagnosis not present

## 2017-05-27 DIAGNOSIS — I10 Essential (primary) hypertension: Secondary | ICD-10-CM | POA: Insufficient documentation

## 2017-05-27 DIAGNOSIS — R1030 Lower abdominal pain, unspecified: Secondary | ICD-10-CM

## 2017-05-27 DIAGNOSIS — R51 Headache: Secondary | ICD-10-CM

## 2017-05-27 DIAGNOSIS — J410 Simple chronic bronchitis: Secondary | ICD-10-CM | POA: Diagnosis not present

## 2017-05-27 DIAGNOSIS — Z9114 Patient's other noncompliance with medication regimen: Secondary | ICD-10-CM

## 2017-05-27 DIAGNOSIS — Z7982 Long term (current) use of aspirin: Secondary | ICD-10-CM

## 2017-05-27 DIAGNOSIS — E872 Acidosis: Secondary | ICD-10-CM | POA: Diagnosis not present

## 2017-05-27 DIAGNOSIS — E1110 Type 2 diabetes mellitus with ketoacidosis without coma: Principal | ICD-10-CM

## 2017-05-27 DIAGNOSIS — R0981 Nasal congestion: Secondary | ICD-10-CM

## 2017-05-27 DIAGNOSIS — F17210 Nicotine dependence, cigarettes, uncomplicated: Secondary | ICD-10-CM

## 2017-05-27 DIAGNOSIS — Z794 Long term (current) use of insulin: Secondary | ICD-10-CM

## 2017-05-27 DIAGNOSIS — Z79899 Other long term (current) drug therapy: Secondary | ICD-10-CM | POA: Insufficient documentation

## 2017-05-27 LAB — BASIC METABOLIC PANEL
ANION GAP: 7 (ref 5–15)
Anion gap: 11 (ref 5–15)
BUN: 23 mg/dL — ABNORMAL HIGH (ref 6–20)
BUN: 21 mg/dL — AB (ref 6–20)
CALCIUM: 8.2 mg/dL — AB (ref 8.9–10.3)
CALCIUM: 7.8 mg/dL — AB (ref 8.9–10.3)
CO2: 25 mmol/L (ref 22–32)
CO2: 21 mmol/L — AB (ref 22–32)
CREATININE: 1.22 mg/dL (ref 0.61–1.24)
CREATININE: 0.96 mg/dL (ref 0.61–1.24)
Chloride: 103 mmol/L (ref 101–111)
Chloride: 101 mmol/L (ref 101–111)
GFR calc Af Amer: >60 mL/min (ref >60)
GFR calc Af Amer: >60 mL/min (ref >60)
GLUCOSE: 214 mg/dL — AB (ref 65–99)
GLUCOSE: 221 mg/dL — AB (ref 65–99)
Potassium: 3.7 mmol/L (ref 3.5–5.1)
Potassium: 3.7 mmol/L (ref 3.5–5.1)
Sodium: 135 mmol/L (ref 135–145)
Sodium: 133 mmol/L — ABNORMAL LOW (ref 135–145)

## 2017-05-27 LAB — CBC
HCT: 51.2 % (ref 39.0–52.0)
HEMOGLOBIN: 16.5 g/dL (ref 13.0–17.0)
MCH: 32 pg (ref 26.0–34.0)
MCHC: 32.2 g/dL (ref 30.0–36.0)
MCV: 99.2 fL (ref 78.0–100.0)
PLATELETS: 244 10*3/uL (ref 150–400)
RBC: 5.16 MIL/uL (ref 4.22–5.81)
RDW: 13.3 % (ref 11.5–15.5)
WBC: 16 10*3/uL — ABNORMAL HIGH (ref 4.0–10.5)

## 2017-05-27 LAB — COMPREHENSIVE METABOLIC PANEL
ALT: 17 U/L (ref 17–63)
AST: 18 U/L (ref 15–41)
Albumin: 3.5 g/dL (ref 3.5–5.0)
Alkaline Phosphatase: 116 U/L (ref 38–126)
Anion gap: 25 — ABNORMAL HIGH (ref 5–15)
BUN: 29 mg/dL — ABNORMAL HIGH (ref 6–20)
CHLORIDE: 92 mmol/L — AB (ref 101–111)
CO2: 15 mmol/L — AB (ref 22–32)
CREATININE: 1.36 mg/dL — AB (ref 0.61–1.24)
Calcium: 9.2 mg/dL (ref 8.9–10.3)
GFR calc non Af Amer: >60 mL/min (ref >60)
Glucose, Bld: 343 mg/dL — ABNORMAL HIGH (ref 65–99)
Potassium: 4.4 mmol/L (ref 3.5–5.1)
SODIUM: 132 mmol/L — AB (ref 135–145)
Total Bilirubin: 2.3 mg/dL — ABNORMAL HIGH (ref 0.3–1.2)
Total Protein: 7.6 g/dL (ref 6.5–8.1)

## 2017-05-27 LAB — CBG MONITORING, ED
GLUCOSE-CAPILLARY: 356 mg/dL — AB (ref 65–99)
GLUCOSE-CAPILLARY: 279 mg/dL — AB (ref 65–99)
GLUCOSE-CAPILLARY: 252 mg/dL — AB (ref 65–99)
GLUCOSE-CAPILLARY: 233 mg/dL — AB (ref 65–99)
Glucose-Capillary: 248 mg/dL — ABNORMAL HIGH (ref 65–99)
Glucose-Capillary: 189 mg/dL — ABNORMAL HIGH (ref 65–99)

## 2017-05-27 LAB — URINALYSIS, ROUTINE W REFLEX MICROSCOPIC
BACTERIA UA: NONE SEEN
Bilirubin Urine: NEGATIVE
Glucose, UA: >=500 mg/dL — AB
Ketones, ur: 20 mg/dL — AB
Leukocytes, UA: NEGATIVE
Nitrite: NEGATIVE
PROTEIN: NEGATIVE mg/dL
SQUAMOUS EPITHELIAL / LPF: NONE SEEN
Specific Gravity, Urine: 1.026 (ref 1.005–1.030)
pH: 5 (ref 5.0–8.0)

## 2017-05-27 LAB — LIPASE, BLOOD: LIPASE: 16 U/L (ref 11–51)

## 2017-05-27 LAB — MAGNESIUM: Magnesium: 2 mg/dL (ref 1.7–2.4)

## 2017-05-27 LAB — MRSA PCR SCREENING: MRSA by PCR: POSITIVE — AB

## 2017-05-27 LAB — GLUCOSE, CAPILLARY
GLUCOSE-CAPILLARY: 151 mg/dL — AB (ref 65–99)
GLUCOSE-CAPILLARY: 133 mg/dL — AB (ref 65–99)
GLUCOSE-CAPILLARY: 139 mg/dL — AB (ref 65–99)
Glucose-Capillary: 193 mg/dL — ABNORMAL HIGH (ref 65–99)

## 2017-05-27 MED ORDER — ASPIRIN 81 MG PO CHEW
81.0000 mg | CHEWABLE_TABLET | Freq: Every day | ORAL | Status: DC
  Administered 2017-05-27: 81 mg via ORAL
  Filled 2017-05-27: qty 1

## 2017-05-27 MED ORDER — ENOXAPARIN SODIUM 30 MG/0.3ML ~~LOC~~ SOLN
30.0000 mg | SUBCUTANEOUS | Status: DC
  Administered 2017-05-27: 30 mg via SUBCUTANEOUS
  Filled 2017-05-27: qty 0.3

## 2017-05-27 MED ORDER — SODIUM CHLORIDE 0.45 % IV SOLN: INTRAVENOUS | Status: DC

## 2017-05-27 MED ORDER — SERTRALINE HCL 100 MG PO TABS
200.0000 mg | ORAL_TABLET | Freq: Every day | ORAL | Status: DC
  Administered 2017-05-27 – 2017-05-28 (×2): 200 mg via ORAL
  Filled 2017-05-27 (×3): qty 2

## 2017-05-27 MED ORDER — METOPROLOL SUCCINATE ER 50 MG PO TB24
50.0000 mg | ORAL_TABLET | Freq: Every day | ORAL | Status: DC
  Administered 2017-05-27 – 2017-05-28 (×2): 50 mg via ORAL
  Filled 2017-05-27 (×3): qty 1

## 2017-05-27 MED ORDER — ATORVASTATIN CALCIUM 40 MG PO TABS
40.0000 mg | ORAL_TABLET | Freq: Every day | ORAL | Status: DC
  Administered 2017-05-27: 40 mg via ORAL
  Filled 2017-05-27: qty 1

## 2017-05-27 MED ORDER — DEXTROSE-NACL 5-0.45 % IV SOLN: INTRAVENOUS | Status: DC

## 2017-05-27 MED ORDER — SODIUM CHLORIDE 0.9 % IV BOLUS (SEPSIS)
1000.0000 mL | Freq: Once | INTRAVENOUS | Status: AC
  Administered 2017-05-27: 1000 mL via INTRAVENOUS

## 2017-05-27 MED ORDER — ALPRAZOLAM 0.5 MG PO TABS
1.0000 mg | ORAL_TABLET | Freq: Every evening | ORAL | Status: DC | PRN
Start: 2017-05-27 — End: 2017-05-28

## 2017-05-27 MED ORDER — GI COCKTAIL ~~LOC~~
30.0000 mL | Freq: Once | ORAL | Status: AC
  Administered 2017-05-27: 30 mL via ORAL
  Filled 2017-05-27: qty 30

## 2017-05-27 MED ORDER — SODIUM CHLORIDE 0.9 % IV SOLN
INTRAVENOUS | Status: DC
  Administered 2017-05-27: 13:00:00 via INTRAVENOUS

## 2017-05-27 MED ORDER — SODIUM CHLORIDE 0.9 % IV SOLN
INTRAVENOUS | Status: AC
  Administered 2017-05-27: 12:00:00 via INTRAVENOUS

## 2017-05-27 MED ORDER — SODIUM CHLORIDE 0.9 % IV SOLN
INTRAVENOUS | Status: DC
  Administered 2017-05-27: 1.7 [IU]/h via INTRAVENOUS

## 2017-05-27 MED ORDER — FAMOTIDINE 20 MG PO TABS
20.0000 mg | ORAL_TABLET | Freq: Every day | ORAL | Status: DC
  Administered 2017-05-27 – 2017-05-28 (×2): 20 mg via ORAL
  Filled 2017-05-27 (×2): qty 1

## 2017-05-27 MED ORDER — INSULIN ASPART 100 UNIT/ML ~~LOC~~ SOLN: 0.0000 [IU] | SUBCUTANEOUS | Status: DC

## 2017-05-27 MED ORDER — LOSARTAN POTASSIUM 50 MG PO TABS
100.0000 mg | ORAL_TABLET | Freq: Every day | ORAL | Status: DC
  Administered 2017-05-27 – 2017-05-28 (×2): 100 mg via ORAL
  Filled 2017-05-27 (×2): qty 2

## 2017-05-27 MED ORDER — ACETAMINOPHEN 325 MG PO TABS: 650.0000 mg | ORAL_TABLET | ORAL | Status: DC | PRN

## 2017-05-27 MED ORDER — KETOTIFEN FUMARATE 0.025 % OP SOLN
1.0000 [drp] | Freq: Two times a day (BID) | OPHTHALMIC | Status: DC
  Administered 2017-05-27 – 2017-05-28 (×2): 1 [drp] via OPHTHALMIC
  Filled 2017-05-27: qty 5

## 2017-05-27 MED ORDER — ONDANSETRON HCL 4 MG/2ML IJ SOLN
4.0000 mg | Freq: Once | INTRAMUSCULAR | Status: AC
  Administered 2017-05-27: 4 mg via INTRAVENOUS
  Filled 2017-05-27: qty 2

## 2017-05-27 MED ORDER — METOPROLOL TARTRATE 5 MG/5ML IV SOLN
5.0000 mg | Freq: Once | INTRAVENOUS | Status: AC
  Administered 2017-05-27: 5 mg via INTRAVENOUS
  Filled 2017-05-27: qty 5

## 2017-05-27 MED ORDER — INSULIN REGULAR BOLUS VIA INFUSION
0.0000 [IU] | Freq: Three times a day (TID) | INTRAVENOUS | Status: DC
  Filled 2017-05-27: qty 10

## 2017-05-27 MED ORDER — INSULIN REGULAR HUMAN 100 UNIT/ML IJ SOLN
INTRAMUSCULAR | Status: DC
  Filled 2017-05-27: qty 1

## 2017-05-27 MED ORDER — SODIUM CHLORIDE 0.9 % IV SOLN
INTRAVENOUS | Status: DC
  Administered 2017-05-27: 125 mL/h via INTRAVENOUS

## 2017-05-27 MED ORDER — PROSIGHT PO TABS
1.0000 | ORAL_TABLET | Freq: Every day | ORAL | Status: DC
  Administered 2017-05-27 – 2017-05-28 (×2): 1 via ORAL
  Filled 2017-05-27 (×2): qty 1

## 2017-05-27 MED ORDER — DEXTROSE-NACL 5-0.45 % IV SOLN
INTRAVENOUS | Status: DC
  Administered 2017-05-27: 13:00:00 via INTRAVENOUS

## 2017-05-27 MED ORDER — INSULIN DETEMIR 100 UNIT/ML ~~LOC~~ SOLN
10.0000 [IU] | Freq: Once | SUBCUTANEOUS | Status: AC
  Administered 2017-05-27: 10 [IU] via SUBCUTANEOUS
  Filled 2017-05-27: qty 0.1

## 2017-05-27 NOTE — ED Notes (Signed)
RN notified of increase in BP 

## 2017-05-27 NOTE — H&P (Signed)
History and Physical    [name] [name] ZOX:[id number] DOB: 12/28/1970 DOA: 05/27/2017  PCP: Lindaann Pascal, PA-C Patient coming from: [facility]  Chief Complaint: Vomiting   HPI: [name] [name] is a 46 y.o. male with medical history significant of insulin dependent diabetes, DKA, hypertension, Hyperlipidemia and depressions presents to the Ed for continued vomiting. Pt was admitted to the hospital on 12/25 for DKa and vomiting.  Pt reports he lost his glucometer and has not been able to check his glucose.  Pt reports he is unable to eat and drink due to vomiting.  Pt reports he has not taken his blood pressure medications  Pt denies any fever, no chills.  Pt reports some abdominal cramping.  No cough     ED Course: Pt given Iv fluids and Iv insulin in ED   Review of Systems  Constitutional: Negative for fever.  HENT: Negative for ear pain.   Eyes: Negative for blurred vision.  Respiratory: Negative for sputum production.   Cardiovascular: Negative for chest pain.  Gastrointestinal: Positive for diarrhea, nausea and vomiting.  Musculoskeletal: Positive for neck pain.  Skin: Negative for rash.  Neurological: Negative for headaches.  Psychiatric/Behavioral: Negative for depression.  All other systems reviewed and are negative.   Ambulatory Status:ambulatory  Past Medical History:  Diagnosis Date  . Atypical chest pain   . Diabetes mellitus without complication (HCC)   . Hyperlipemia   . Hypertension   . Malaise and fatigue     Past Surgical History:  Procedure Laterality Date  . ANKLE SURGERY  1982  . APPENDECTOMY  1975  . CARDIAC CATHETERIZATION N/A 03/28/2015   Procedure: Left Heart Cath and Coronary Angiography;  Surgeon: Yates Decamp, MD;  Location: [facility] INVASIVE CV LAB;  Service: Cardiovascular;  Laterality: N/A;  . CARDIAC CATHETERIZATION N/A 03/28/2015   Procedure: Intravascular Pressure Wire/FFR Study;  Surgeon: Yates Decamp, MD;  Location: [facility] INVASIVE CV LAB;  Service:  Cardiovascular;  Laterality: N/A;  . CARPAL TUNNEL RELEASE Left   . ELBOW FRACTURE SURGERY Left   . PILONIDAL CYST EXCISION N/A 03/24/2017   Procedure: EXCISION CHRONIC  PILONIDAL ABSCESS;  Surgeon: [name] Miyamoto, MD;  Location: [facility] ORS;  Service: General;  Laterality: N/A;    Social History   Socioeconomic History  . Marital status: Single    Spouse name: Not on file  . Number of children: 0  . Years of education: 10th g  . Highest education level: Not on file  Social Needs  . Financial resource strain: Not on file  . Food insecurity - worry: Not on file  . Food insecurity - inability: Not on file  . Transportation needs - medical: Not on file  . Transportation needs - non-medical: Not on file  Occupational History  . 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 Smoking status: Current Every Day Smoker    Packs/day: 1.00    Years: 29.00    Pack years: 29.00    Types: Cigarettes  . Smokeless tobacco: Never Used  Substance and Sexual Activity  . Alcohol use: No    Alcohol/week: 0.0 oz  . Drug use: No  . Sexual activity: Not on file  Other Topics Concern  . Not on file  Social History Narrative  . Not on file    Allergies  Allergen Reactions  . Omeprazole Magnesium Swelling  . Esomeprazole Other (See Comments)    Facial swelling    Family History  Problem Relation Age of Onset  .  Congestive Heart Failure Sister   . Diabetes Mother   . Hypertension Mother   . Diabetes Father   . Hypertension Father     Prior to Admission medications   Medication Sig Start Date End Date Taking? Authorizing Provider  acetaminophen (TYLENOL) 500 MG tablet Take 1,000 mg by mouth every 6 (six) hours as needed.   Yes [provider]  ALPRAZolam Prudy Feeler) 1 MG tablet Take 1 mg by mouth at bedtime as needed for anxiety or sleep.   Yes [provider]  aspirin 81 MG chewable tablet Chew 81 mg by mouth at bedtime.    Yes [provider]  atorvastatin  (LIPITOR) 40 MG tablet Take 40 mg by mouth daily.  07/29/14  Yes [provider]  azelastine (OPTIVAR) 0.05 % ophthalmic solution Place 1 drop into both eyes 2 (two) times daily.   Yes [provider]  buPROPion (WELLBUTRIN SR) 100 MG 12 hr tablet Take 100 mg by mouth 2 (two) times daily. Takes one tablet in the morning and three at night.   Yes [provider]  Cholecalciferol (VITAMIN D3) 5000 units TABS Take 5,000 Units by mouth daily.   Yes [provider]  Empagliflozin-Linagliptin (GLYXAMBI) 10-5 MG TABS Take 1 tablet by mouth daily. Before breakfast   Yes [provider]  glimepiride (AMARYL) 4 MG tablet Take 4 mg by mouth daily with breakfast.   Yes [provider]  HYDROcodone-acetaminophen (NORCO/VICODIN) 5-325 MG tablet Take 1 tablet by mouth every 4 (four) hours as needed. for pain 05/19/17  Yes [provider]  Insulin Degludec (TRESIBA FLEXTOUCH) 200 UNIT/ML SOPN Inject 6 Units into the skin at bedtime. 05/25/17  Yes Osvaldo Shipper, MD  insulin lispro (HUMALOG KWIKPEN) 100 UNIT/ML KiwkPen Inject 4-6 Units into the skin every evening. Over 100 = 2 units Over 200 =4 units Over 300 =6 units   Yes [provider]  losartan (COZAAR) 100 MG tablet Take 100 mg by mouth daily.   Yes [provider]  metFORMIN (GLUCOPHAGE) 500 MG tablet Take 1 tablet (500 mg total) by mouth 2 (two) times daily with a meal. 12/01/15  Yes Richarda Overlie, MD  metoprolol succinate (TOPROL-XL) 50 MG 24 hr tablet Take 50 mg by mouth daily.  10/28/15  Yes [provider]  multivitamin-lutein (OCUVITE-LUTEIN) CAPS capsule Take 1 capsule by mouth daily.   Yes [provider]  neomycin-bacitracin-polymyxin (NEOSPORIN) ointment Apply 1 application topically 3 (three) times daily. To the surgical site on your back 05/25/17  Yes Osvaldo Shipper, MD  Omega-3 Fatty Acids (FISH OIL PO) Take 1 capsule by mouth daily.   Yes [provider]  ranitidine (ZANTAC) 150 MG tablet Take 150 mg by mouth daily.   Yes [provider]  sertraline (ZOLOFT) 100 MG tablet Take 200 mg by mouth daily.    Yes [provider]  traMADol (ULTRAM) 50 MG tablet Take 50 mg by mouth every 6 (six) hours as needed for pain.   Yes [provider]  vitamin B-12 (CYANOCOBALAMIN) 1000 MCG tablet Take 1,000 mcg by mouth daily.   Yes [provider]    Physical Exam: Vitals:   05/27/17 1100 05/27/17 1115 05/27/17 1145 05/27/17 1200  BP: (!) 183/123 (!) 186/123 (!) 176/93 (!) 172/101  Pulse: (!) 105 (!) 101 94 96  Resp: 18 16 (!) 23 16  Temp:      TempSrc:      SpO2: 100% 97% 96% 98%  Weight:  Height:         General:  Appears calm and comfortable Eyes:  PERRL, EOMI, normal lids, iris ENT:  grossly normal hearing, lips & tongue, mmm Neck:  no LAD, masses or thyromegaly Cardiovascular:  RRR, no m/r/g. No LE edema.  Respiratory:  CTA bilaterally, no w/r/r. Normal respiratory effort. Abdomen:  soft, ntnd, NABS Skin:  no rash or induration seen on limited exam Musculoskeletal:  grossly normal tone BUE/BLE, good ROM, no bony abnormality Psychiatric:  grossly normal mood and affect, speech fluent and appropriate, AOx3 Neurologic:  CN 2-12 grossly intact, moves all extremities in coordinated fashion, sensation intact  Labs on Admission: I have personally reviewed following labs and imaging studies  CBC: Recent Labs  Lab 05/23/17 2103 05/24/17 0352 05/25/17 0206 05/27/17 0843  WBC 19.3* 14.9* 13.0* 16.0*  NEUTROABS 17.5* 13.0*  --   --   HGB 15.8 14.9 14.4 16.5  HCT 45.2 43.5 42.8 51.2  MCV 91.3 91.8 93.0 99.2  PLT 260 245 238 244   Basic Metabolic Panel: Recent Labs  Lab 05/23/17 2103 05/24/17 0352 05/24/17 1000 05/25/17 0206 05/27/17 0843 05/27/17 0853  NA 132* 137 137 135 132*  --   K 4.7 4.3 4.0 3.9 4.4  --   CL 97* 104 104 104 92*  --   CO2 17* 17* 22 24 15*  --   GLUCOSE  376* 308* 181* 188* 343*  --   BUN 18 21* 17 11 29*  --   CREATININE 1.57* 1.34* 0.92 0.85 1.36*  --   CALCIUM 9.1 8.4* 8.4* 8.5* 9.2  --   MG  --   --   --   --   --  2.0   GFR: Estimated Creatinine Clearance: 65 mL/min (A) (by C-G formula based on SCr of 1.36 mg/dL (H)). Liver Function Tests: Recent Labs  Lab 05/23/17 2103 05/24/17 0352 05/27/17 0843  AST 23 18 18   ALT 22 19 17   ALKPHOS 120 108 116  BILITOT 2.0* 1.7* 2.3*  PROT 7.6 7.2 7.6  ALBUMIN 3.7 3.3* 3.5   Recent Labs  Lab 05/23/17 2103 05/27/17 0843  LIPASE 18 16   No results for input(s): AMMONIA in the last 168 hours. Coagulation Profile: Recent Labs  Lab 05/24/17 0239  INR 1.10   Cardiac Enzymes: Recent Labs  Lab 05/24/17 0352 05/24/17 1000 05/24/17 1450  TROPONINI <0.03 <0.03 <0.03   BNP (last 3 results) No results for input(s): PROBNP in the last 8760 hours. HbA1C: No results for input(s): HGBA1C in the last 72 hours. CBG: Recent Labs  Lab 05/25/17 1242 05/27/17 0855 05/27/17 1038 05/27/17 1152 05/27/17 1236  GLUCAP 223* 356* 279* 252* 233*   Lipid Profile: No results for input(s): CHOL, HDL, LDLCALC, TRIG, CHOLHDL, LDLDIRECT in the last 72 hours. Thyroid Function Tests: No results for input(s): TSH, T4TOTAL, FREET4, T3FREE, THYROIDAB in the last 72 hours. Anemia Panel: No results for input(s): VITAMINB12, FOLATE, FERRITIN, TIBC, IRON, RETICCTPCT in the last 72 hours. Urine analysis:    Component Value Date/Time   COLORURINE YELLOW 05/27/2017 0843   APPEARANCEUR CLEAR 05/27/2017 0843   LABSPEC 1.026 05/27/2017 0843   PHURINE 5.0 05/27/2017 0843   GLUCOSEU >=500 (A) 05/27/2017 0843   HGBUR SMALL (A) 05/27/2017 0843   BILIRUBINUR NEGATIVE 05/27/2017 0843   KETONESUR 20 (A) 05/27/2017 0843   PROTEINUR NEGATIVE 05/27/2017 0843   NITRITE NEGATIVE 05/27/2017 0843   LEUKOCYTESUR NEGATIVE 05/27/2017 0843    Creatinine Clearance: Estimated Creatinine Clearance:  65 mL/min (A) (by C-G  formula based on SCr of 1.36 mg/dL (H)).  Sepsis Labs: @LABRCNTIP (procalcitonin:4,lacticidven:4) ) Recent Results (from the past 240 hour(s))  MRSA PCR Screening     Status: None   Collection Time: 05/24/17  6:59 AM  Result Value Ref Range Status   MRSA by PCR NEGATIVE NEGATIVE Final    Comment:        The GeneXpert MRSA Assay (FDA approved for NASAL specimens only), is one component of a comprehensive MRSA colonization surveillance program. It is not intended to diagnose MRSA infection nor to guide or monitor treatment for MRSA infections.   Culture, blood (routine x 2)     Status: None (Preliminary result)   Collection Time: 05/24/17 10:00 AM  Result Value Ref Range Status   Specimen Description BLOOD RIGHT FOREARM  Final   Special Requests   Final    BOTTLES DRAWN AEROBIC AND ANAEROBIC Blood Culture adequate volume   Culture NO GROWTH 2 DAYS  Final   Report Status PENDING  Incomplete  Culture, blood (routine x 2)     Status: None (Preliminary result)   Collection Time: 05/24/17 10:15 AM  Result Value Ref Range Status   Specimen Description BLOOD RIGHT HAND  Final   Special Requests   Final    BOTTLES DRAWN AEROBIC AND ANAEROBIC Blood Culture adequate volume   Culture NO GROWTH 2 DAYS  Final   Report Status PENDING  Incomplete     Radiological Exams on Admission: No results found.  EKG: Independently reviewed.   Assessment/Plan Active Problems:   DKA (diabetic ketoacidoses) (HCC)   Pt is given Iv insulin and Iv fluids, BMet q 4 hours.     Hypertension --Pt to be given home medications   Nausea and vomiting --clear liquids  --Zofran   DVT prophylaxis: Lovenox Code Status: full Family Communication: Mother at bedside Disposition Plan: Admission for observation  Consults called: none  Admission status: Observation     Langston MaskerKaren Taylin Mans PA-C [facility]  If 7PM-7AM, please contact night-coverage [email or web address] Password [email or web address]  05/27/2017, 12:38 PM

## 2017-05-27 NOTE — ED Notes (Signed)
Pt given ice chips per Dahlia ClientHannah, [place]

## 2017-05-27 NOTE — ED Notes (Signed)
Unable to stick pt isvomiting viens rolling

## 2017-05-27 NOTE — ED Notes (Signed)
cbg 189 

## 2017-05-27 NOTE — Progress Notes (Signed)
Inpatient Diabetes Program Recommendations  AACE/ADA: New Consensus Statement on Inpatient Glycemic Control (2015)  Target Ranges:  Prepandial:   less than 140 mg/dL      Peak postprandial:   less than 180 mg/dL (1-2 hours)      Critically ill patients:  140 - 180 mg/dL  Results for [name] [name], Jianni L (MRN [id number]) as of 05/27/2017 12:56  Ref. Range 05/27/2017 08:55 05/27/2017 10:38 05/27/2017 11:52 05/27/2017 12:36  Glucose-Capillary Latest Ref Range: 65 - 99 mg/dL 409356 (H) 811279 (H) 914252 (H) 233 (H)   Results for [name] [name], Jayvin L (MRN [id number]) as of 05/27/2017 12:56  Ref. Range 05/27/2017 08:43  CO2 Latest Ref Range: 22 - 32 mmol/L 15 (L)  Glucose Latest Ref Range: 65 - 99 mg/dL 086343 (H)  Results for [name] [name], Caellum L (MRN [id number]) as of 05/27/2017 12:56  Ref. Range 05/27/2017 08:43  Anion gap Latest Ref Range: 5 - 15  25 (H)   Results for [name] [name], Diego L (MRN [id number]) as of 05/27/2017 12:56  Ref. Range 10/15/2016 19:04 03/22/2017 15:08 05/24/2017 10:00  Hemoglobin A1C Latest Ref Range: 4.8 - 5.6 % 10.9 (H) 11.2 (H) 8.7 (H)   Review of Glycemic Control  Diabetes history: DM2 Outpatient Diabetes medications: Tresiba 4 units QHS, Humalog 2-6 units daily around 2-3:00pm (depending on glucose), Metformin XR 500 mg BID, Glyxambi 10/5 mg QAM Current orders for Inpatient glycemic control: IV insulin drip  Inpatient Diabetes Program Recommendations:  Insulin-SQ: Once acidosis is cleared and MD is ready to transition from IV to SQ insulin, please consider ordering Lantus 10 units Q24H and Novolog 0-9 units Q4H.  Oral Agents: Noted patient was was admitted with DKA on 05/24/17 and has presented to the hospital today again with DKA. Glyxambi has been known to cause ketoacidosis in some cases.  MD may want to consider discontinuing outpatient Glyxambi and have patient follow up with Dr. Leslie DalesAltheimer regarding DM control.  NOTE: In reviewing chart, noted patient was admitted with DKA on 05/24/17  and discharged on 05/25/17 and patient presented back to hospital today in DKA. Noted patient recently started seeing Dr. Leslie DalesAltheimer on 03/31/17 and last seen him on 05/12/17. C-peptide level noted to be 1.36 NG/ML on 03/17/17.  Spoke with patient over the phone (Diabetes Coordinator working from [facility]) about diabetes and home regimen for diabetes control. Patient reports that he is followed by Dr. Leslie DalesAltheimer (Endocrinologist) for diabetes management and currently he takes Tresiba 4 units QHS, Humalog 2-6 units daily around 2-3:00pm (depending on glucose), Metformin XR 500 mg BID, Glyxambi 10/5 mg QAM as an outpatient for diabetes control. Patient was started on Glyxambi on 03/31/2017 (at initial visit with Endocrinologist). Noted Amaryl 4 mg QAM on home med list and patient states that he is not taking that medication because Dr. Leslie DalesAltheimer asked him to discontinue it. Patient reports that he has not checked his glucose since he was discharged from the hospital on 05/25/17 because he had his glucometer with him at the hospital but he lost his glucometer while at the hospital.  Patient has called his insurance company and he will need to purchase a new glucometer which will be over $80 which he states that he can not afford. Noted patient was given Rx for FreeStyle Libre (continuious glucose monitor) by Dr. Leslie DalesAltheimer but patient states that he can not afford the $75 per month for the sensors.  [facility] and a box of 50 test strips for $9  and patient states that he can afford $20 to get a new Reli-On glucometer and test strips.  Patient reports that the last 2 weeks prior to hospital admission on 05/24/17, his glucose was in the 200-300's mg/dl when he checked it. Patient reports that he is still experiencing hypoglycemia as well.  Patient has a follow up appointment with Dr. Leslie DalesAltheimer on 06/09/17. Encouraged patient to be sure to pay attention to discharge instructions  in case any DM medications are changed, to be sure to go get new Reli-On glucometer and test strips, to check glucose 3-4 times per day, and to follow up with Dr. Leslie DalesAltheimer.  Patient verbalized understanding of information discussed and he states that he has no further questions at this time related to diabetes.  Thanks, Orlando PennerMarie Caroline Longie, RN, MSN, CDE Diabetes Coordinator Inpatient Diabetes Program [phone] (Team Pager)

## 2017-05-27 NOTE — Progress Notes (Signed)
Dr. Delma PostHall,C updated with latest CBG = 133, and ANION GAP at 7. New order received to give pt 10 units Levemir SQ after pt has eaten food. Monitor x 2 hours , keep insulin gtts  and Fluids x 2 hours then d/c

## 2017-05-27 NOTE — ED Provider Notes (Signed)
[facility] EMERGENCY DEPARTMENT Provider Note   CSN: [id number] Arrival date & time: 05/27/17  4098     History   Chief Complaint Chief Complaint  Patient presents with  . Abdominal Pain  . Emesis  . Nausea  . Headache    HPI [name] [name] is a 46 y.o. male with history of insulin-dependent diabetes, DKA. hypertension, hyperlipidemia, depression presents for evaluation of persistent, intermittent nausea and nonbloody, nonbilious emesis 2 days. Associated symptoms include diffuse right lower abdominal pain that he attributes to constant throwing up as well as chills. Reports nasal congestion, improving. Patient recently discharged from hospital 2 days ago for DKA, began having nausea and vomiting soon after arriving home. Has not taken any of his medications including blood pressure medicines in the last 2 days due to nausea and vomiting. Endorses decreased by mouth intake. Uses 2 units of insulin daily at bedtime and 2-4 units at 2 PM, has been compliant with this.   He denies fevers, chest pain, shortness of breath, Polyuria, dysuria, diarrhea, constipation. Denies heavy EtOH use. Denies marijuana use. HPI  Past Medical History:  Diagnosis Date  . Atypical chest pain   . Diabetes mellitus without complication (HCC)   . Hyperlipemia   . Hypertension   . Malaise and fatigue     Patient Active Problem List   Diagnosis Date Noted  . DKA (diabetic ketoacidoses) (HCC) 10/15/2016  . Gastroesophageal reflux disease 10/15/2016  . Anxiety with depression 10/15/2016  . Essential hypertension 10/15/2016  . Dehydration   . Leukocytosis 11/25/2015  . Intractable nausea and vomiting 11/25/2015  . Volume depletion 11/25/2015  . Hyperglycemia 11/25/2015  . Dental abscess 11/25/2015  . Nausea and vomiting 11/25/2015  . Angina pectoris (HCC) 03/27/2015    Past Surgical History:  Procedure Laterality Date  . ANKLE SURGERY  1982  . APPENDECTOMY  1975  .  CARDIAC CATHETERIZATION N/A 03/28/2015   Procedure: Left Heart Cath and Coronary Angiography;  Surgeon: Yates Decamp, MD;  Location: [facility] INVASIVE CV LAB;  Service: Cardiovascular;  Laterality: N/A;  . CARDIAC CATHETERIZATION N/A 03/28/2015   Procedure: Intravascular Pressure Wire/FFR Study;  Surgeon: Yates Decamp, MD;  Location: [facility] INVASIVE CV LAB;  Service: Cardiovascular;  Laterality: N/A;  . CARPAL TUNNEL RELEASE Left   . ELBOW FRACTURE SURGERY Left   . PILONIDAL CYST EXCISION N/A 03/24/2017   Procedure: EXCISION CHRONIC  PILONIDAL ABSCESS;  Surgeon: [name] Miyamoto, MD;  Location: [facility] ORS;  Service: General;  Laterality: N/A;       Home Medications    Prior to Admission medications   Medication Sig Start Date End Date Taking? Authorizing Provider  acetaminophen (TYLENOL) 500 MG tablet Take 1,000 mg by mouth every 6 (six) hours as needed.   Yes [provider]  ALPRAZolam Prudy Feeler) 1 MG tablet Take 1 mg by mouth at bedtime as needed for anxiety or sleep.   Yes [provider]  aspirin 81 MG chewable tablet Chew 81 mg by mouth at bedtime.    Yes [provider]  atorvastatin (LIPITOR) 40 MG tablet Take 40 mg by mouth daily.  07/29/14  Yes [provider]  azelastine (OPTIVAR) 0.05 % ophthalmic solution Place 1 drop into both eyes 2 (two) times daily.   Yes [provider]  buPROPion (WELLBUTRIN SR) 100 MG 12 hr tablet Take 100 mg by mouth 2 (two) times daily. Takes one tablet in the morning and three at night.   Yes [provider]  Cholecalciferol (VITAMIN D3) 5000 units TABS Take 5,000 Units by mouth daily.   Yes [provider]  Empagliflozin-Linagliptin (GLYXAMBI) 10-5 MG TABS Take 1 tablet by mouth daily. Before breakfast   Yes [provider]  glimepiride (AMARYL) 4 MG tablet Take 4 mg by mouth daily with breakfast.   Yes [provider]  HYDROcodone-acetaminophen (NORCO/VICODIN) 5-325 MG tablet Take 1 tablet by  mouth every 4 (four) hours as needed. for pain 05/19/17  Yes [provider]  Insulin Degludec (TRESIBA FLEXTOUCH) 200 UNIT/ML SOPN Inject 6 Units into the skin at bedtime. 05/25/17  Yes Osvaldo ShipperKrishnan, Gokul, MD  insulin lispro (HUMALOG KWIKPEN) 100 UNIT/ML KiwkPen Inject 4-6 Units into the skin every evening. Over 100 = 2 units Over 200 =4 units Over 300 =6 units   Yes [provider]  losartan (COZAAR) 100 MG tablet Take 100 mg by mouth daily.   Yes [provider]  metFORMIN (GLUCOPHAGE) 500 MG tablet Take 1 tablet (500 mg total) by mouth 2 (two) times daily with a meal. 12/01/15  Yes Richarda OverlieAbrol, Nayana, MD  metoprolol succinate (TOPROL-XL) 50 MG 24 hr tablet Take 50 mg by mouth daily.  10/28/15  Yes [provider]  multivitamin-lutein (OCUVITE-LUTEIN) CAPS capsule Take 1 capsule by mouth daily.   Yes [provider]  neomycin-bacitracin-polymyxin (NEOSPORIN) ointment Apply 1 application topically 3 (three) times daily. To the surgical site on your back 05/25/17  Yes Osvaldo ShipperKrishnan, Gokul, MD  Omega-3 Fatty Acids (FISH OIL PO) Take 1 capsule by mouth daily.   Yes [provider]  ranitidine (ZANTAC) 150 MG tablet Take 150 mg by mouth daily.   Yes [provider]  sertraline (ZOLOFT) 100 MG tablet Take 200 mg by mouth daily.    Yes [provider]  traMADol (ULTRAM) 50 MG tablet Take 50 mg by mouth every 6 (six) hours as needed for pain.   Yes [provider]  vitamin B-12 (CYANOCOBALAMIN) 1000 MCG tablet Take 1,000 mcg by mouth daily.   Yes [provider]  oxyCODONE (OXY IR/ROXICODONE) 5 MG immediate release tablet Take 1-2 tablets (5-10 mg total) by mouth every 6 (six) hours as needed for moderate pain, severe pain or breakthrough pain. Patient not taking: Reported on 05/27/2017 03/24/17   [name] MiyamotoBlackman, Douglas, MD    Family History Family History  Problem Relation Age of Onset  . Congestive Heart Failure Sister   .  Diabetes Mother   . Hypertension Mother   . Diabetes Father   . Hypertension Father     Social History Social History   Tobacco Use  . Smoking status: Current Every Day Smoker    Packs/day: 1.00    Years: 29.00    Pack years: 29.00    Types: Cigarettes  . Smokeless tobacco: Never Used  Substance Use Topics  . Alcohol use: No    Alcohol/week: 0.0 oz  . Drug use: No     Allergies   Omeprazole magnesium and Esomeprazole   Review of Systems Review of Systems  Constitutional: Positive for appetite change and chills.  HENT: Positive for congestion.   Gastrointestinal: Positive for abdominal pain, nausea and vomiting.  Allergic/Immunologic: Positive for immunocompromised state (IDDM).  All other systems reviewed and are negative.    Physical Exam Updated Vital Signs BP (!) 186/123   Pulse (!) 101   Temp 97.6 F (36.4 C) (Oral)   Resp 16   Ht 5\' 8"  (1.727 m)   Wt 78.5 kg (173 lb)  SpO2 97%   BMI 26.30 kg/m   Physical Exam  Constitutional: He is oriented to person, place, and time. He appears well-developed and well-nourished.  No distress. Non toxic.   HENT:  Head: Normocephalic and atraumatic.  Nose: Nose normal.  Mouth/Throat: Mucous membranes are dry.  Dry mucous membranes. Oropharynx and tonsils normal.   Eyes: EOM are normal. Pupils are equal, round, and reactive to light.  Neck: Neck supple.  Cardiovascular: Normal rate and regular rhythm.  RRR. 2+ DP, radial and femoral pulses bilaterally. No LE edema  Pulmonary/Chest: Effort normal and breath sounds normal. No respiratory distress.  Lungs CTAB. No crackles.   Abdominal: Soft. Bowel sounds are normal. There is tenderness in the left upper quadrant and left lower quadrant.  + Non focal lower abdominal tenderness. No G/R/R. No suprapubic or CVAT. No pulsating masses.   Neurological: He is alert and oriented to person, place, and time.  Skin: Skin is warm and dry. Capillary refill takes less than 2  seconds.  Psychiatric: He has a normal mood and affect. His behavior is normal. Judgment and thought content normal.     ED Treatments / Results  Labs (all labs ordered are listed, but only abnormal results are displayed) Labs Reviewed  COMPREHENSIVE METABOLIC PANEL - Abnormal; Notable for the following components:      Result Value   Sodium 132 (*)    Chloride 92 (*)    CO2 15 (*)    Glucose, Bld 343 (*)    BUN 29 (*)    Creatinine, Ser 1.36 (*)    Total Bilirubin 2.3 (*)    Anion gap 25 (*)    All other components within normal limits  CBC - Abnormal; Notable for the following components:   WBC 16.0 (*)    All other components within normal limits  URINALYSIS, ROUTINE W REFLEX MICROSCOPIC - Abnormal; Notable for the following components:   Glucose, UA >=500 (*)    Hgb urine dipstick SMALL (*)    Ketones, ur 20 (*)    All other components within normal limits  CBG MONITORING, ED - Abnormal; Notable for the following components:   Glucose-Capillary 356 (*)    All other components within normal limits  CBG MONITORING, ED - Abnormal; Notable for the following components:   Glucose-Capillary 279 (*)    All other components within normal limits  LIPASE, BLOOD  MAGNESIUM    EKG  EKG Interpretation None       Radiology No results found.  Procedures Procedures (including critical care time)  Medications Ordered in ED Medications  dextrose 5 %-0.45 % sodium chloride infusion ( Intravenous Hold 05/27/17 1030)  0.9 %  sodium chloride infusion (125 mL/hr Intravenous New Bag/Given 05/27/17 1036)  insulin regular bolus via infusion 0-10 Units (not administered)  insulin regular (NOVOLIN R,HUMULIN R) 100 Units in sodium chloride 0.9 % 100 mL (1 Units/mL) infusion (not administered)  metoprolol tartrate (LOPRESSOR) injection 5 mg (5 mg Intravenous Given 05/27/17 1030)  sodium chloride 0.9 % bolus 1,000 mL (0 mLs Intravenous Stopped 05/27/17 1029)  ondansetron (ZOFRAN)  injection 4 mg (4 mg Intravenous Given 05/27/17 1037)  gi cocktail (Maalox,Lidocaine,Donnatal) (30 mLs Oral Given 05/27/17 1124)     Initial Impression / Assessment and Plan / ED Course  I have reviewed the triage vital signs and the nursing notes.  Pertinent labs & imaging results that were available during my care of the patient were reviewed by me and considered in my  medical decision making (see chart for details).  Clinical Course as of May 27 1148  Fri May 27, 2017  1123 CO2: (!) 15 [CG]  1123 Glucose: (!) 343 [CG]  1123 BUN: (!) 29 [CG]  1123 Creatinine: (!) 1.36 [CG]  1123 Anion gap: (!) 25 [CG]  1123 WBC: (!) 16.0 [CG]    Clinical Course User Index [CG] Liberty Handy, PA-C   Pt is a 46 y.o. male with hx diabetes on insulin who presents with nausea and vomiting x 2 days since being discharged from hospital for DKA. On exam, pt looks dry. Nontoxic/nonseptic appearing. Afebrile. Hypertensive but unable to take BP meds in the last 2 days due to vomiting. Abdomen soft but diffusely tender in lower quadrants, no signs of peritonitis, likely from recurrent vomiting. Initial CBG >300. CBC with leukocytosis. Potassium 4.4. Gap 25. UA with >500 glucose and 20 ketones. Patient given IVF bolus, infusion and insulin correction scale and drip in ED. ED lab work consistent with DKA without coma, plan is to admit.   Final Clinical Impressions(s) / ED Diagnoses   Final diagnoses:  Diabetic ketoacidosis without coma associated with type 2 diabetes mellitus [facility])    ED Discharge Orders    None       Jerrell Mylar 05/27/17 1228    Margarita Grizzle, MD 05/29/17 [phone]

## 2017-05-27 NOTE — ED Notes (Signed)
cbg 233 

## 2017-05-27 NOTE — ED Notes (Addendum)
CBG 252 

## 2017-05-27 NOTE — Progress Notes (Signed)
Call MD on call, verify with doctor if it was ok to take off patient from all drips since day nurse had initiated phase II and given Levemir at 6pm and patient had 3 CBG readings <250.  Stated to doctor that BMET doesn't meet criteria due to Anion gap at 7 and CO2 at 25.   Per MD Bruna Potter([place]), stated that it was ok to take off patient from all drips.

## 2017-05-27 NOTE — ED Triage Notes (Signed)
Pt.stated, Im still sick from the 24th, with headache, nausea, vomiting and my stomach is killing me.

## 2017-05-27 NOTE — ED Notes (Signed)
Attempted report 

## 2017-05-27 NOTE — ED Notes (Signed)
Pt aware that a urine sample is needed.  

## 2017-05-27 NOTE — Progress Notes (Signed)
This is an addendum of H&P dictated by Advance Practitioner Cheron SchaumannSofia, Leslie PA-C:  [name] PettyDanny L Ullman is a 46 y.o. male with medical history significant of insulin dependent diabetes, DKA, hypertension, Hyperlipidemia and depression [facility] for persistent nausea and vomiting. Discharged on 05/25/17 after being admitted for DKA. This time, patient reports he lost his glucometer and has not been able to check his blood glucose. Reports persistent nausea, vomiting, diarrhea, polydypsia and polyuria.   ED BS 356  With anion gap of 25. Started on insulin drip. Denies recent sick contacts.  Agree with assessment and plan done by APP.

## 2017-05-28 ENCOUNTER — Other Ambulatory Visit: Payer: Self-pay

## 2017-05-28 DIAGNOSIS — E1110 Type 2 diabetes mellitus with ketoacidosis without coma: Secondary | ICD-10-CM | POA: Diagnosis not present

## 2017-05-28 LAB — BASIC METABOLIC PANEL
ANION GAP: 13 (ref 5–15)
ANION GAP: 9 (ref 5–15)
Anion gap: 9 (ref 5–15)
BUN: 16 mg/dL (ref 6–20)
BUN: 17 mg/dL (ref 6–20)
BUN: 18 mg/dL (ref 6–20)
CHLORIDE: 98 mmol/L — AB (ref 101–111)
CHLORIDE: 100 mmol/L — AB (ref 101–111)
CO2: 20 mmol/L — ABNORMAL LOW (ref 22–32)
CO2: 22 mmol/L (ref 22–32)
CO2: 24 mmol/L (ref 22–32)
Calcium: 8 mg/dL — ABNORMAL LOW (ref 8.9–10.3)
Calcium: 8.5 mg/dL — ABNORMAL LOW (ref 8.9–10.3)
Calcium: 8.8 mg/dL — ABNORMAL LOW (ref 8.9–10.3)
Chloride: 106 mmol/L (ref 101–111)
Creatinine, Ser: 0.97 mg/dL (ref 0.61–1.24)
Creatinine, Ser: 0.88 mg/dL (ref 0.61–1.24)
Creatinine, Ser: 1 mg/dL (ref 0.61–1.24)
GFR calc Af Amer: >60 mL/min (ref >60)
GFR calc non Af Amer: >60 mL/min (ref >60)
Glucose, Bld: 126 mg/dL — ABNORMAL HIGH (ref 65–99)
Glucose, Bld: 161 mg/dL — ABNORMAL HIGH (ref 65–99)
Glucose, Bld: 187 mg/dL — ABNORMAL HIGH (ref 65–99)
POTASSIUM: 4.1 mmol/L (ref 3.5–5.1)
POTASSIUM: 4 mmol/L (ref 3.5–5.1)
POTASSIUM: 4.2 mmol/L (ref 3.5–5.1)
SODIUM: 135 mmol/L (ref 135–145)
SODIUM: 133 mmol/L — AB (ref 135–145)
Sodium: 133 mmol/L — ABNORMAL LOW (ref 135–145)

## 2017-05-28 LAB — CBC
HEMATOCRIT: 44.1 % (ref 39.0–52.0)
HEMOGLOBIN: 15.1 g/dL (ref 13.0–17.0)
MCH: 31.4 pg (ref 26.0–34.0)
MCHC: 34.2 g/dL (ref 30.0–36.0)
MCV: 91.7 fL (ref 78.0–100.0)
Platelets: 237 10*3/uL (ref 150–400)
RBC: 4.81 MIL/uL (ref 4.22–5.81)
RDW: 12.5 % (ref 11.5–15.5)
WBC: 10.7 10*3/uL — AB (ref 4.0–10.5)

## 2017-05-28 LAB — GLUCOSE, CAPILLARY
GLUCOSE-CAPILLARY: 160 mg/dL — AB (ref 65–99)
GLUCOSE-CAPILLARY: 172 mg/dL — AB (ref 65–99)
GLUCOSE-CAPILLARY: 152 mg/dL — AB (ref 65–99)
GLUCOSE-CAPILLARY: 203 mg/dL — AB (ref 65–99)
Glucose-Capillary: 156 mg/dL — ABNORMAL HIGH (ref 65–99)

## 2017-05-28 MED ORDER — MUPIROCIN 2 % EX OINT
1.0000 "application " | TOPICAL_OINTMENT | Freq: Two times a day (BID) | CUTANEOUS | Status: DC
  Administered 2017-05-28: 1 via NASAL
  Filled 2017-05-28: qty 22

## 2017-05-28 MED ORDER — CHLORHEXIDINE GLUCONATE CLOTH 2 % EX PADS
6.0000 | MEDICATED_PAD | Freq: Every day | CUTANEOUS | Status: DC
  Administered 2017-05-28: 6 via TOPICAL

## 2017-05-28 MED ORDER — SUCRALFATE 1 G PO TABS: 1.0000 g | ORAL_TABLET | Freq: Four times a day (QID) | ORAL | 0 refills | Status: DC

## 2017-05-28 MED ORDER — ALUM & MAG HYDROXIDE-SIMETH 200-200-20 MG/5ML PO SUSP
15.0000 mL | ORAL | Status: DC | PRN
  Administered 2017-05-28: 15 mL via ORAL
  Filled 2017-05-28 (×2): qty 30

## 2017-05-28 MED ORDER — INSULIN DETEMIR 100 UNIT/ML ~~LOC~~ SOLN
10.0000 [IU] | Freq: Two times a day (BID) | SUBCUTANEOUS | Status: DC
  Administered 2017-05-28: 10 [IU] via SUBCUTANEOUS
  Filled 2017-05-28 (×3): qty 0.1

## 2017-05-28 MED ORDER — ONDANSETRON HCL 4 MG/2ML IJ SOLN
4.0000 mg | Freq: Four times a day (QID) | INTRAMUSCULAR | Status: DC | PRN
  Administered 2017-05-28 (×2): 4 mg via INTRAVENOUS
  Filled 2017-05-28 (×2): qty 2

## 2017-05-28 MED ORDER — ACETAMINOPHEN 500 MG PO TABS: 500.0000 mg | ORAL_TABLET | Freq: Four times a day (QID) | ORAL | 0 refills | Status: DC | PRN

## 2017-05-28 MED ORDER — ENOXAPARIN SODIUM 40 MG/0.4ML ~~LOC~~ SOLN: 40.0000 mg | SUBCUTANEOUS | Status: DC

## 2017-05-28 NOTE — Discharge Summary (Signed)
Discharge Summary  [name] [name] Rensch XLK:[id number] DOB: 09/28/1970  PCP: Lindaann PascalLong, Scott, PA-C  Admit date: 05/27/2017 Discharge date: 05/28/2017  Time spent: <9330mins  Recommendations for Outpatient Follow-up:  1. F/u with PMD within a week  for hospital discharge follow up, repeat cbc/bmp at follow up. pmd to refer patient to GI for intermittent n/v, burning sensation of esophagus and throat, to consider EGD /gastroparesis work up. 2. F/u with endocrinology for diabetes and recurrent dka  Discharge Diagnoses:  Active Hospital Problems   Diagnosis Date Noted  . DKA (diabetic ketoacidoses) (HCC) 10/15/2016    Resolved Hospital Problems  No resolved problems to display.    Discharge Condition: stable  Diet recommendation: heart healthy/carb modified  Filed Weights   05/27/17 0750 05/28/17 0440  Weight: 78.5 kg (173 lb) 77.2 kg (170 lb 3.1 oz)    History of present illness:  PCP: Lindaann PascalLong, Scott, PA-C Patient coming from: [facility]  Chief Complaint: Vomiting   HPI: [name] [name] Wedge is a 46 y.o. male with medical history significant of insulin dependent diabetes, DKA, hypertension, Hyperlipidemia and depressions presents to the Ed for continued vomiting. Pt was admitted to the hospital on 12/25 for DKa and vomiting.  Pt reports he lost his glucometer and has not been able to check his glucose.  Pt reports he is unable to eat and drink due to vomiting.  Pt reports he has not taken his blood pressure medications  Pt denies any fever, no chills.  Pt reports some abdominal cramping.  No cough     ED Course: Pt given Iv fluids and Iv insulin in ED   Review of Systems  Constitutional: Negative for fever.  HENT: Negative for ear pain.   Eyes: Negative for blurred vision.  Respiratory: Negative for sputum production.   Cardiovascular: Negative for chest pain.  Gastrointestinal: Positive for diarrhea, nausea and vomiting.  Musculoskeletal: Positive for neck pain.  Skin: Negative for  rash.  Neurological: Negative for headaches.  Psychiatric/Behavioral: Negative for depression.  All other systems reviewed and are negative.    Hospital Course:  Active Problems:   DKA (diabetic ketoacidoses) (HCC)  Recurrent DKA, [facility] and started on insulin drip/ivf, gap closed, he is transitioned to subQ insulin.   Recurrent intermittent n/v : Non in the hospital , he tolerated diet advancement. He report feeling burning sensation in his throat and esophagus, suspect acid reflux, omeprazole and nexium listed as allergy. He is discharged on sucralfate, continue home meds zantac, zofran prn. pmd to refer patient to gi for egd/gastroparesis work up.  Insulin dependent DM2, a1c 8.7,  Continue home meds tesiba / humolog ssi, metformin, glyxambi, glimepiride  HTN: continue home meds asa/toprolxl, lisinopril  HLD; continue lipitor  Procedures:  none  Consultations:  none  Discharge Exam: BP (!) 143/93 (BP Location: Left Arm)   Pulse 83   Temp 98 F (36.7 C) (Oral)   Resp 18   Ht 5\' 8"  (1.727 m)   Wt 77.2 kg (170 lb 3.1 oz)   SpO2 98%   BMI 25.88 kg/m   General: NAD,  Cardiovascular: RRR Respiratory: CTABL Abdomen: soft, NT/ND, + bowel sounds  Discharge Instructions You were cared for by a hospitalist during your hospital stay. If you have any questions about your discharge medications or the care you received while you were in the hospital after you are discharged, you can call the unit and asked to speak with the hospitalist on call if the hospitalist that  took care of you is not available. Once you are discharged, your primary care physician will handle any further medical issues. Please note that NO REFILLS for any discharge medications will be authorized once you are discharged, as it is imperative that you return to your primary care physician (or establish a relationship with a primary care physician if you do not have one) for your  aftercare needs so that they can reassess your need for medications and monitor your lab values.  Discharge Instructions    Diet - low sodium heart healthy   Complete by:  As directed    Carb modified diet   Increase activity slowly   Complete by:  As directed      Allergies as of 05/28/2017      Reactions   Omeprazole Magnesium Swelling   Esomeprazole Other (See Comments)   Facial swelling      Medication List    STOP taking these medications   ALPRAZolam 1 MG tablet Commonly known as:  XANAX   HYDROcodone-acetaminophen 5-325 MG tablet Commonly known as:  NORCO/VICODIN     TAKE these medications   acetaminophen 500 MG tablet Commonly known as:  TYLENOL Take 1 tablet (500 mg total) by mouth every 6 (six) hours as needed. What changed:  how much to take   aspirin 81 MG chewable tablet Chew 81 mg by mouth at bedtime.   atorvastatin 40 MG tablet Commonly known as:  LIPITOR Take 40 mg by mouth daily.   azelastine 0.05 % ophthalmic solution Commonly known as:  OPTIVAR Place 1 drop into both eyes 2 (two) times daily.   buPROPion 100 MG 12 hr tablet Commonly known as:  WELLBUTRIN SR Take 100 mg by mouth 2 (two) times daily. Takes one tablet in the morning and three at night.   FISH OIL PO Take 1 capsule by mouth daily.   glimepiride 4 MG tablet Commonly known as:  AMARYL Take 4 mg by mouth daily with breakfast.   GLYXAMBI 10-5 MG Tabs Generic drug:  Empagliflozin-Linagliptin Take 1 tablet by mouth daily. Before breakfast   HUMALOG KWIKPEN 100 UNIT/ML KiwkPen Generic drug:  insulin lispro Inject 4-6 Units into the skin every evening. Over 100 = 2 units Over 200 =4 units Over 300 =6 units   Insulin Degludec 200 UNIT/ML Sopn Commonly known as:  TRESIBA FLEXTOUCH Inject 6 Units into the skin at bedtime.   losartan 100 MG tablet Commonly known as:  COZAAR Take 100 mg by mouth daily.   metFORMIN 500 MG tablet Commonly known as:  GLUCOPHAGE Take 1 tablet  (500 mg total) by mouth 2 (two) times daily with a meal.   metoprolol succinate 50 MG 24 hr tablet Commonly known as:  TOPROL-XL Take 50 mg by mouth daily.   multivitamin-lutein Caps capsule Take 1 capsule by mouth daily.   neomycin-bacitracin-polymyxin ointment Commonly known as:  NEOSPORIN Apply 1 application topically 3 (three) times daily. To the surgical site on your back   ranitidine 150 MG tablet Commonly known as:  ZANTAC Take 150 mg by mouth daily.   sertraline 100 MG tablet Commonly known as:  ZOLOFT Take 200 mg by mouth daily.   sucralfate 1 g tablet Commonly known as:  CARAFATE Take 1 tablet (1 g total) by mouth 4 (four) times daily for 14 days.   traMADol 50 MG tablet Commonly known as:  ULTRAM Take 50 mg by mouth every 6 (six) hours as needed for pain.   vitamin B-12  1000 MCG tablet Commonly known as:  CYANOCOBALAMIN Take 1,000 mcg by mouth daily.   Vitamin D3 5000 units Tabs Take 5,000 Units by mouth daily.      Allergies  Allergen Reactions  . Omeprazole Magnesium Swelling  . Esomeprazole Other (See Comments)    Facial swelling   Follow-up Information    Long, Scott, PA-C Follow up in 1 week(s).   Specialty:  Physician Assistant Why:  hospital discharge follow up, pmd to refer your  to gastroenterologist for intermittent n/v . consdier EGD, and gastroparesis work up. Contact information: [address] [place] [phone]        Altheimer, Casimiro NeedleMichael, MD Follow up.   Specialty:  Endocrinology Why:  diabetes, dka Contact information: [address] [place] [address] [phone]            The results of significant diagnostics from this hospitalization (including imaging, microbiology, ancillary and laboratory) are listed below for reference.    Significant Diagnostic Studies: Dg Chest 2 View  Result Date: 05/24/2017 CLINICAL DATA:  Body aches and emesis EXAM: CHEST  2 VIEW COMPARISON:  Chest  radiograph 11/23/2015 FINDINGS: The heart size and mediastinal contours are within normal limits. Both lungs are clear. The visualized skeletal structures are unremarkable. IMPRESSION: No active cardiopulmonary disease. Electronically Signed   By: Deatra RobinsonKevin  Herman M.D.   On: 05/24/2017 02:31   [place] Abdomen Complete  Result Date: 05/24/2017 CLINICAL DATA:  Nausea, vomiting EXAM: ABDOMEN ULTRASOUND COMPLETE COMPARISON:  10/15/2016 FINDINGS: Gallbladder: No gallstones or wall thickening visualized. No sonographic Murphy sign noted by sonographer. Common bile duct: Diameter: Normal caliber, 3 mm Liver: Increased echotexture compatible with fatty infiltration. No focal abnormality or biliary ductal dilatation. Portal vein is patent on color Doppler imaging with normal direction of blood flow towards the liver. IVC: No abnormality visualized. Pancreas: Not well visualized due to overlying bowel gas. Spleen: Size and appearance within normal limits. Right Kidney: Length: 11.4 cm. Echogenicity within normal limits. No mass or hydronephrosis visualized. Left Kidney: Length: 11.7 cm. Echogenicity within normal limits. No mass or hydronephrosis visualized. Abdominal aorta: Not well visualized due to overlying bowel gas. Other findings: None. IMPRESSION: No acute findings. Electronically Signed   By: Charlett NoseKevin  Dover M.D.   On: 05/24/2017 08:58    Microbiology: Recent Results (from the past 240 hour(s))  MRSA PCR Screening     Status: None   Collection Time: 05/24/17  6:59 AM  Result Value Ref Range Status   MRSA by PCR NEGATIVE NEGATIVE Final    Comment:        The GeneXpert MRSA Assay (FDA approved for NASAL specimens only), is one component of a comprehensive MRSA colonization surveillance program. It is not intended to diagnose MRSA infection nor to guide or monitor treatment for MRSA infections.   Culture, blood (routine x 2)     Status: None (Preliminary result)   Collection Time: 05/24/17 10:00 AM    Result Value Ref Range Status   Specimen Description BLOOD RIGHT FOREARM  Final   Special Requests   Final    BOTTLES DRAWN AEROBIC AND ANAEROBIC Blood Culture adequate volume   Culture NO GROWTH 4 DAYS  Final   Report Status PENDING  Incomplete  Culture, blood (routine x 2)     Status: None (Preliminary result)   Collection Time: 05/24/17 10:15 AM  Result Value Ref Range Status   Specimen Description BLOOD RIGHT HAND  Final   Special Requests  Final    BOTTLES DRAWN AEROBIC AND ANAEROBIC Blood Culture adequate volume   Culture NO GROWTH 4 DAYS  Final   Report Status PENDING  Incomplete  MRSA PCR Screening     Status: Abnormal   Collection Time: 05/27/17  3:44 PM  Result Value Ref Range Status   MRSA by PCR POSITIVE (A) NEGATIVE Final    Comment:        The GeneXpert MRSA Assay (FDA approved for NASAL specimens only), is one component of a comprehensive MRSA colonization surveillance program. It is not intended to diagnose MRSA infection nor to guide or monitor treatment for MRSA infections. RESULT CALLED TO, READ BACK BY AND VERIFIED WITH: RN Angeline Slim [id number] MLM      Labs: Basic Metabolic Panel: Recent Labs  Lab 05/27/17 0853 05/27/17 1552 05/27/17 1944 05/28/17 0035 05/28/17 0347 05/28/17 0744  NA  --  135 133* 135 133* 133*  K  --  3.7 3.7 4.1 4.0 4.2  CL  --  103 101 106 98* 100*  CO2  --  25 21* 20* 22 24  GLUCOSE  --  214* 221* 126* 161* 187*  BUN  --  23* 21* 18 16 17   CREATININE  --  1.22 0.96 0.97 0.88 1.00  CALCIUM  --  8.2* 7.8* 8.0* 8.5* 8.8*  MG 2.0  --   --   --   --   --    Liver Function Tests: Recent Labs  Lab 05/23/17 2103 05/24/17 0352 05/27/17 0843  AST 23 18 18   ALT 22 19 17   ALKPHOS 120 108 116  BILITOT 2.0* 1.7* 2.3*  PROT 7.6 7.2 7.6  ALBUMIN 3.7 3.3* 3.5   Recent Labs  Lab 05/23/17 2103 05/27/17 0843  LIPASE 18 16   No results for input(s): AMMONIA in the last 168 hours. CBC: Recent Labs  Lab 05/23/17 2103  05/24/17 0352 05/25/17 0206 05/27/17 0843 05/28/17 0347  WBC 19.3* 14.9* 13.0* 16.0* 10.7*  NEUTROABS 17.5* 13.0*  --   --   --   HGB 15.8 14.9 14.4 16.5 15.1  HCT 45.2 43.5 42.8 51.2 44.1  MCV 91.3 91.8 93.0 99.2 91.7  PLT 260 245 238 244 237   Cardiac Enzymes: Recent Labs  Lab 05/24/17 0352 05/24/17 1000 05/24/17 1450  TROPONINI <0.03 <0.03 <0.03   BNP: BNP (last 3 results) No results for input(s): BNP in the last 8760 hours.  ProBNP (last 3 results) No results for input(s): PROBNP in the last 8760 hours.  CBG: Recent Labs  Lab 05/28/17 0112 05/28/17 0427 05/28/17 0756 05/28/17 1154 05/28/17 1338  GLUCAP 160* 172* 156* 152* 203*       Signed:  Albertine Grates MD, PhD  [facility] 05/28/2017, 3:00 PM

## 2017-05-28 NOTE — Progress Notes (Signed)
Patient had heartburn and requested something to help with this but before giving Maalox, patient started feeling nauseous and wanted something for his symptoms.  Text MD on call, ordered for Zofran (4mg ) was put in and was given to patient. Checked blood sugar levels: 172 Hold Maalox for now.

## 2017-05-29 LAB — CULTURE, BLOOD (ROUTINE X 2)
CULTURE: NO GROWTH
CULTURE: NO GROWTH
SPECIAL REQUESTS: ADEQUATE
Special Requests: ADEQUATE

## 2017-05-30 ENCOUNTER — Emergency Department (HOSPITAL_COMMUNITY): Payer: 59

## 2017-05-30 ENCOUNTER — Encounter (HOSPITAL_COMMUNITY): Payer: Self-pay | Admitting: *Deleted

## 2017-05-30 ENCOUNTER — Other Ambulatory Visit: Payer: Self-pay

## 2017-05-30 ENCOUNTER — Inpatient Hospital Stay (HOSPITAL_COMMUNITY)
Admission: EM | Admit: 2017-05-30 | Discharge: 2017-06-02 | DRG: 638 | Disposition: A | Discharge status: Home / Self Care | Payer: 59 | Attending: Internal Medicine | Admitting: Internal Medicine

## 2017-05-30 DIAGNOSIS — E1110 Type 2 diabetes mellitus with ketoacidosis without coma: Principal | ICD-10-CM | POA: Diagnosis present

## 2017-05-30 DIAGNOSIS — D72829 Elevated white blood cell count, unspecified: Secondary | ICD-10-CM | POA: Diagnosis present

## 2017-05-30 DIAGNOSIS — R112 Nausea with vomiting, unspecified: Secondary | ICD-10-CM | POA: Diagnosis present

## 2017-05-30 DIAGNOSIS — K219 Gastro-esophageal reflux disease without esophagitis: Secondary | ICD-10-CM | POA: Diagnosis present

## 2017-05-30 DIAGNOSIS — Z888 Allergy status to other drugs, medicaments and biological substances status: Secondary | ICD-10-CM

## 2017-05-30 DIAGNOSIS — F17210 Nicotine dependence, cigarettes, uncomplicated: Secondary | ICD-10-CM | POA: Diagnosis present

## 2017-05-30 DIAGNOSIS — N179 Acute kidney failure, unspecified: Secondary | ICD-10-CM | POA: Diagnosis present

## 2017-05-30 DIAGNOSIS — E1143 Type 2 diabetes mellitus with diabetic autonomic (poly)neuropathy: Secondary | ICD-10-CM

## 2017-05-30 DIAGNOSIS — E785 Hyperlipidemia, unspecified: Secondary | ICD-10-CM | POA: Diagnosis present

## 2017-05-30 DIAGNOSIS — Z8249 Family history of ischemic heart disease and other diseases of the circulatory system: Secondary | ICD-10-CM | POA: Diagnosis not present

## 2017-05-30 DIAGNOSIS — E8729 Other acidosis: Secondary | ICD-10-CM

## 2017-05-30 DIAGNOSIS — K3184 Gastroparesis: Secondary | ICD-10-CM | POA: Diagnosis present

## 2017-05-30 DIAGNOSIS — Z833 Family history of diabetes mellitus: Secondary | ICD-10-CM

## 2017-05-30 DIAGNOSIS — R000 Tachycardia, unspecified: Secondary | ICD-10-CM | POA: Diagnosis present

## 2017-05-30 DIAGNOSIS — I10 Essential (primary) hypertension: Secondary | ICD-10-CM

## 2017-05-30 DIAGNOSIS — Z7982 Long term (current) use of aspirin: Secondary | ICD-10-CM

## 2017-05-30 DIAGNOSIS — E872 Acidosis: Secondary | ICD-10-CM | POA: Diagnosis present

## 2017-05-30 DIAGNOSIS — F418 Other specified anxiety disorders: Secondary | ICD-10-CM | POA: Diagnosis present

## 2017-05-30 DIAGNOSIS — Z794 Long term (current) use of insulin: Secondary | ICD-10-CM | POA: Diagnosis not present

## 2017-05-30 LAB — CBG MONITORING, ED
GLUCOSE-CAPILLARY: 241 mg/dL — AB (ref 65–99)
Glucose-Capillary: 246 mg/dL — ABNORMAL HIGH (ref 65–99)
Glucose-Capillary: 251 mg/dL — ABNORMAL HIGH (ref 65–99)
Glucose-Capillary: 181 mg/dL — ABNORMAL HIGH (ref 65–99)

## 2017-05-30 LAB — BASIC METABOLIC PANEL
Anion gap: 19 — ABNORMAL HIGH (ref 5–15)
BUN: 38 mg/dL — ABNORMAL HIGH (ref 6–20)
CHLORIDE: 98 mmol/L — AB (ref 101–111)
CO2: 18 mmol/L — AB (ref 22–32)
CREATININE: 1.91 mg/dL — AB (ref 0.61–1.24)
Calcium: 8 mg/dL — ABNORMAL LOW (ref 8.9–10.3)
GFR calc non Af Amer: 40 mL/min — ABNORMAL LOW (ref >60)
GFR, EST AFRICAN AMERICAN: 47 mL/min — AB (ref >60)
Glucose, Bld: 211 mg/dL — ABNORMAL HIGH (ref 65–99)
POTASSIUM: 4.1 mmol/L (ref 3.5–5.1)
Sodium: 135 mmol/L (ref 135–145)

## 2017-05-30 LAB — COMPREHENSIVE METABOLIC PANEL
ALT: 15 U/L — AB (ref 17–63)
ANION GAP: 22 — AB (ref 5–15)
AST: 14 U/L — ABNORMAL LOW (ref 15–41)
Albumin: 4 g/dL (ref 3.5–5.0)
Alkaline Phosphatase: 128 U/L — ABNORMAL HIGH (ref 38–126)
BUN: 29 mg/dL — ABNORMAL HIGH (ref 6–20)
CHLORIDE: 92 mmol/L — AB (ref 101–111)
CO2: 21 mmol/L — AB (ref 22–32)
CREATININE: 1.74 mg/dL — AB (ref 0.61–1.24)
Calcium: 9.6 mg/dL (ref 8.9–10.3)
GFR, EST AFRICAN AMERICAN: 52 mL/min — AB (ref >60)
GFR, EST NON AFRICAN AMERICAN: 45 mL/min — AB (ref >60)
Glucose, Bld: 246 mg/dL — ABNORMAL HIGH (ref 65–99)
POTASSIUM: 4.5 mmol/L (ref 3.5–5.1)
SODIUM: 135 mmol/L (ref 135–145)
Total Bilirubin: 1.8 mg/dL — ABNORMAL HIGH (ref 0.3–1.2)
Total Protein: 8.4 g/dL — ABNORMAL HIGH (ref 6.5–8.1)

## 2017-05-30 LAB — I-STAT VENOUS BLOOD GAS, ED
ACID-BASE DEFICIT: 6 mmol/L — AB (ref 0.0–2.0)
BICARBONATE: 19.9 mmol/L — AB (ref 20.0–28.0)
O2 SAT: 48 %
PCO2 VEN: 37.8 mmHg — AB (ref 44.0–60.0)
PO2 VEN: 28 mmHg — AB (ref 32.0–45.0)
TCO2: 21 mmol/L — ABNORMAL LOW (ref 22–32)
pH, Ven: 7.328 (ref 7.250–7.430)

## 2017-05-30 LAB — CBC
HCT: 50 % (ref 39.0–52.0)
HEMOGLOBIN: 17.3 g/dL — AB (ref 13.0–17.0)
MCH: 31.7 pg (ref 26.0–34.0)
MCHC: 34.6 g/dL (ref 30.0–36.0)
MCV: 91.7 fL (ref 78.0–100.0)
PLATELETS: 325 10*3/uL (ref 150–400)
RBC: 5.45 MIL/uL (ref 4.22–5.81)
RDW: 12.6 % (ref 11.5–15.5)
WBC: 19.3 10*3/uL — AB (ref 4.0–10.5)

## 2017-05-30 LAB — I-STAT CG4 LACTIC ACID, ED: LACTIC ACID, VENOUS: 2.23 mmol/L — AB (ref 0.5–1.9)

## 2017-05-30 LAB — LIPASE, BLOOD: LIPASE: 22 U/L (ref 11–51)

## 2017-05-30 LAB — ACETAMINOPHEN LEVEL: Acetaminophen (Tylenol), Serum: <10 ug/mL — ABNORMAL LOW (ref 10–30)

## 2017-05-30 LAB — SALICYLATE LEVEL: Salicylate Lvl: <7 mg/dL (ref 2.8–30.0)

## 2017-05-30 LAB — ETHANOL

## 2017-05-30 MED ORDER — DEXTROSE-NACL 5-0.45 % IV SOLN
INTRAVENOUS | Status: DC
  Administered 2017-05-30: 23:00:00 via INTRAVENOUS

## 2017-05-30 MED ORDER — SODIUM CHLORIDE 0.9 % IV SOLN: INTRAVENOUS | Status: DC

## 2017-05-30 MED ORDER — SUCRALFATE 1 G PO TABS
1.0000 g | ORAL_TABLET | Freq: Four times a day (QID) | ORAL | Status: DC
  Administered 2017-05-31 – 2017-06-02 (×9): 1 g via ORAL
  Filled 2017-05-30 (×9): qty 1

## 2017-05-30 MED ORDER — PIPERACILLIN-TAZOBACTAM 3.375 G IVPB: 3.3750 g | Freq: Three times a day (TID) | INTRAVENOUS | Status: DC

## 2017-05-30 MED ORDER — ONDANSETRON 4 MG PO TBDP
4.0000 mg | ORAL_TABLET | Freq: Once | ORAL | Status: AC | PRN
  Administered 2017-05-30: 4 mg via ORAL
  Filled 2017-05-30: qty 1

## 2017-05-30 MED ORDER — METOCLOPRAMIDE HCL 5 MG/ML IJ SOLN
10.0000 mg | Freq: Once | INTRAMUSCULAR | Status: AC
  Administered 2017-05-30: 10 mg via INTRAVENOUS
  Filled 2017-05-30: qty 2

## 2017-05-30 MED ORDER — FAMOTIDINE 20 MG PO TABS
10.0000 mg | ORAL_TABLET | Freq: Two times a day (BID) | ORAL | Status: DC
  Administered 2017-05-31 – 2017-06-01 (×3): 10 mg via ORAL
  Filled 2017-05-30 (×3): qty 1

## 2017-05-30 MED ORDER — PROSIGHT PO TABS
1.0000 | ORAL_TABLET | Freq: Every day | ORAL | Status: DC
  Administered 2017-05-31 – 2017-06-02 (×3): 1 via ORAL
  Filled 2017-05-30 (×3): qty 1

## 2017-05-30 MED ORDER — SODIUM CHLORIDE 0.9 % IV BOLUS (SEPSIS)
1000.0000 mL | Freq: Once | INTRAVENOUS | Status: AC
  Administered 2017-05-30: 1000 mL via INTRAVENOUS

## 2017-05-30 MED ORDER — DIPHENHYDRAMINE HCL 50 MG/ML IJ SOLN
25.0000 mg | Freq: Once | INTRAMUSCULAR | Status: AC
  Administered 2017-05-30: 25 mg via INTRAVENOUS
  Filled 2017-05-30: qty 1

## 2017-05-30 MED ORDER — ASPIRIN 81 MG PO CHEW
81.0000 mg | CHEWABLE_TABLET | Freq: Every day | ORAL | Status: DC
  Administered 2017-05-31 – 2017-06-01 (×2): 81 mg via ORAL
  Filled 2017-05-30 (×2): qty 1

## 2017-05-30 MED ORDER — INSULIN REGULAR HUMAN 100 UNIT/ML IJ SOLN
INTRAMUSCULAR | Status: DC
  Filled 2017-05-30: qty 1

## 2017-05-30 MED ORDER — ENOXAPARIN SODIUM 40 MG/0.4ML ~~LOC~~ SOLN: 40.0000 mg | SUBCUTANEOUS | Status: DC

## 2017-05-30 MED ORDER — SODIUM CHLORIDE 0.9 % IV BOLUS (SEPSIS)
500.0000 mL | Freq: Once | INTRAVENOUS | Status: AC
  Administered 2017-05-30: 500 mL via INTRAVENOUS

## 2017-05-30 MED ORDER — VANCOMYCIN HCL 10 G IV SOLR
1250.0000 mg | INTRAVENOUS | Status: AC
  Administered 2017-05-30: 1250 mg via INTRAVENOUS
  Filled 2017-05-30: qty 1250

## 2017-05-30 MED ORDER — VITAMIN B-12 1000 MCG PO TABS
1000.0000 ug | ORAL_TABLET | Freq: Every day | ORAL | Status: DC
  Administered 2017-05-31 – 2017-06-02 (×3): 1000 ug via ORAL
  Filled 2017-05-30 (×3): qty 1

## 2017-05-30 MED ORDER — LOSARTAN POTASSIUM 50 MG PO TABS: 100.0000 mg | ORAL_TABLET | Freq: Every day | ORAL | Status: DC

## 2017-05-30 MED ORDER — DEXTROSE-NACL 5-0.45 % IV SOLN
INTRAVENOUS | Status: DC
  Administered 2017-05-31: 01:00:00 via INTRAVENOUS

## 2017-05-30 MED ORDER — SERTRALINE HCL 100 MG PO TABS
200.0000 mg | ORAL_TABLET | Freq: Every day | ORAL | Status: DC
  Administered 2017-05-31 – 2017-06-01 (×2): 200 mg via ORAL
  Filled 2017-05-30 (×3): qty 2

## 2017-05-30 MED ORDER — ALPRAZOLAM 0.25 MG PO TABS: 0.5000 mg | ORAL_TABLET | Freq: Two times a day (BID) | ORAL | Status: DC | PRN

## 2017-05-30 MED ORDER — POTASSIUM CHLORIDE 10 MEQ/100ML IV SOLN
10.0000 meq | INTRAVENOUS | Status: DC
  Administered 2017-05-31: 10 meq via INTRAVENOUS
  Filled 2017-05-30: qty 100

## 2017-05-30 MED ORDER — BUPROPION HCL ER (SR) 100 MG PO TB12
100.0000 mg | ORAL_TABLET | Freq: Two times a day (BID) | ORAL | Status: DC
  Administered 2017-05-31 – 2017-06-02 (×5): 100 mg via ORAL
  Filled 2017-05-30 (×5): qty 1

## 2017-05-30 MED ORDER — VITAMIN D 1000 UNITS PO TABS
5000.0000 [IU] | ORAL_TABLET | Freq: Every day | ORAL | Status: DC
  Administered 2017-05-31 – 2017-06-02 (×3): 5000 [IU] via ORAL
  Filled 2017-05-30 (×3): qty 5

## 2017-05-30 MED ORDER — VANCOMYCIN HCL IN DEXTROSE 1-5 GM/200ML-% IV SOLN: 1000.0000 mg | INTRAVENOUS | Status: DC

## 2017-05-30 MED ORDER — VANCOMYCIN HCL IN DEXTROSE 1-5 GM/200ML-% IV SOLN: 1000.0000 mg | Freq: Once | INTRAVENOUS | Status: DC

## 2017-05-30 MED ORDER — SODIUM CHLORIDE 0.9 % IV SOLN
INTRAVENOUS | Status: DC
  Administered 2017-05-31: 2.6 [IU]/h via INTRAVENOUS
  Administered 2017-05-31: 1.5 [IU]/h via INTRAVENOUS
  Filled 2017-05-30: qty 1

## 2017-05-30 MED ORDER — POTASSIUM CHLORIDE 10 MEQ/100ML IV SOLN
10.0000 meq | INTRAVENOUS | Status: DC
  Administered 2017-05-30: 10 meq via INTRAVENOUS
  Filled 2017-05-30: qty 100

## 2017-05-30 MED ORDER — PIPERACILLIN-TAZOBACTAM 3.375 G IVPB 30 MIN
3.3750 g | Freq: Once | INTRAVENOUS | Status: AC
  Administered 2017-05-30: 3.375 g via INTRAVENOUS
  Filled 2017-05-30: qty 50

## 2017-05-30 MED ORDER — ATORVASTATIN CALCIUM 40 MG PO TABS
40.0000 mg | ORAL_TABLET | Freq: Every day | ORAL | Status: DC
  Administered 2017-05-31 – 2017-06-02 (×3): 40 mg via ORAL
  Filled 2017-05-30 (×3): qty 1

## 2017-05-30 NOTE — ED Provider Notes (Signed)
[facility] Provider Note   CSN: [id number] Arrival date & time: 05/30/17  1049     History   Chief Complaint Chief Complaint  Patient presents with  . Emesis  . Diarrhea    HPI [name] [name] is a 46 y.o. male who has had multiple admissions this month for DKA. The patient was admitted on 05/24/2017 for DKA and 05/27/2017 both for DKA without notable precipitating cause identified. The patient is here today after onset of shaking chills last night. He waited in our ER for almost 10 hours before evaluation. His labs show AKI, elevated WBC and AGMA. Sugar is only  250. Patient is also on metformin and insulin. He has a chronic cough. Denies urinary sxs or cp. He c/o nausea and vomiting. Lactic acid is elevated.  HPI  Past Medical History:  Diagnosis Date  . Atypical chest pain   . Diabetes mellitus without complication (HCC)   . Hyperlipemia   . Hypertension   . Malaise and fatigue     Patient Active Problem List   Diagnosis Date Noted  . DKA (diabetic ketoacidoses) (HCC) 10/15/2016  . Gastroesophageal reflux disease 10/15/2016  . Anxiety with depression 10/15/2016  . Essential hypertension 10/15/2016  . Dehydration   . Leukocytosis 11/25/2015  . Intractable nausea and vomiting 11/25/2015  . Volume depletion 11/25/2015  . Hyperglycemia 11/25/2015  . Dental abscess 11/25/2015  . Nausea and vomiting 11/25/2015  . Angina pectoris (HCC) 03/27/2015    Past Surgical History:  Procedure Laterality Date  . ANKLE SURGERY  1982  . APPENDECTOMY  1975  . CARDIAC CATHETERIZATION N/A 03/28/2015   Procedure: Left Heart Cath and Coronary Angiography;  Surgeon: Yates DecampJay Ganji, MD;  [facility];  Service: Cardiovascular;  Laterality: N/A;  . CARDIAC CATHETERIZATION N/A 03/28/2015   Procedure: Intravascular Pressure Wire/FFR Study;  Surgeon: Yates DecampJay Ganji, MD;  [facility];  Service: Cardiovascular;  Laterality: N/A;    . CARPAL TUNNEL RELEASE Left   . ELBOW FRACTURE SURGERY Left   . PILONIDAL CYST EXCISION N/A 03/24/2017   Procedure: EXCISION CHRONIC  PILONIDAL ABSCESS;  Surgeon:  MiyamotoBlackman, Douglas, MD;  Location: [facility] ORS;  Service: General;  Laterality: N/A;       Home Medications    Prior to Admission medications   Medication Sig Start Date End Date Taking? Authorizing Provider  acetaminophen (TYLENOL) 500 MG tablet Take 1 tablet (500 mg total) by mouth every 6 (six) hours as needed. Patient taking differently: Take 500 mg by mouth every 6 (six) hours as needed (for pain or headaches).  05/28/17  Yes Albertine GratesXu, Fang, MD  ALPRAZolam Prudy Feeler(XANAX) 1 MG tablet Take 0.5-1 mg by mouth 2 (two) times daily as needed for anxiety.  05/12/17  Yes [provider]  aspirin 81 MG chewable tablet Chew 81 mg by mouth at bedtime.    Yes [provider]  atorvastatin (LIPITOR) 40 MG tablet Take 40 mg by mouth daily.  07/29/14  Yes [provider]  azelastine (OPTIVAR) 0.05 % ophthalmic solution Place 1 drop into both eyes 2 (two) times daily.   Yes [provider]  buPROPion (WELLBUTRIN SR) 100 MG 12 hr tablet Take 100 mg by mouth 2 (two) times daily.    Yes [provider]  Cholecalciferol (VITAMIN D3) 5000 units TABS Take 5,000 Units by mouth daily.   Yes [provider]  Empagliflozin-Linagliptin (GLYXAMBI) 10-5 MG TABS Take 1 tablet by mouth daily before  breakfast.    Yes [provider]  glimepiride (AMARYL) 4 MG tablet Take 4 mg by mouth daily with breakfast.   Yes [provider]  Insulin Degludec (TRESIBA FLEXTOUCH) 200 UNIT/ML SOPN Inject 6 Units into the skin at bedtime. Patient taking differently: Inject 4-6 Units into the skin at bedtime.  05/25/17  Yes Osvaldo ShipperKrishnan, Gokul, MD  insulin lispro (HUMALOG KWIKPEN) 100 UNIT/ML KiwkPen Inject 2-6 Units into the skin See admin instructions. 3-6 units in the evening, per sliding scale: BGL >100 = 2 units; >200 = 4  units; >300 = 6 units   Yes [provider]  losartan (COZAAR) 100 MG tablet Take 100 mg by mouth daily.   Yes [provider]  metFORMIN (GLUCOPHAGE-XR) 500 MG 24 hr tablet Take 500 mg by mouth 2 (two) times daily.   Yes [provider]  multivitamin-lutein (OCUVITE-LUTEIN) CAPS capsule Take 1 capsule by mouth daily.   Yes [provider]  neomycin-bacitracin-polymyxin (NEOSPORIN) ointment Apply 1 application topically 3 (three) times daily. To the surgical site on your back 05/25/17  Yes Osvaldo ShipperKrishnan, Gokul, MD  ranitidine (ZANTAC) 150 MG tablet Take 150 mg by mouth at bedtime.    Yes [provider]  sertraline (ZOLOFT) 100 MG tablet Take 200 mg by mouth at bedtime.    Yes [provider]  vitamin B-12 (CYANOCOBALAMIN) 1000 MCG tablet Take 1,000 mcg by mouth daily.   Yes [provider]  metFORMIN (GLUCOPHAGE) 500 MG tablet Take 1 tablet (500 mg total) by mouth 2 (two) times daily with a meal. Patient not taking: Reported on 05/30/2017 12/01/15   Richarda OverlieAbrol, Nayana, MD  sucralfate (CARAFATE) 1 g tablet Take 1 tablet (1 g total) by mouth 4 (four) times daily for 14 days. 05/28/17 06/11/17  Albertine GratesXu, Fang, MD    Family History Family History  Problem Relation Age of Onset  . Congestive Heart Failure Sister   . Diabetes Mother   . Hypertension Mother   . Diabetes Father   . Hypertension Father     Social History Social History   Tobacco Use  . Smoking status: Current Every Day Smoker    Packs/day: 1.00    Years: 29.00    Pack years: 29.00    Types: Cigarettes  . Smokeless tobacco: Never Used  Substance Use Topics  . Alcohol use: No    Alcohol/week: 0.0 oz  . Drug use: No     Allergies   Omeprazole magnesium and Esomeprazole   Review of Systems Review of Systems Ten systems reviewed and are negative for acute change, except as noted in the HPI.    Physical Exam Updated Vital Signs BP (!) 132/98 (BP Location: Right Arm)    Pulse (!) 117   Temp 98.8 F (37.1 C) (Oral)   Resp 18   SpO2 100%   Physical Exam  Constitutional: He is oriented to person, place, and time. He appears well-developed and well-nourished.  HENT:  Head: Normocephalic and atraumatic.  Dry mucosa  Eyes: EOM are normal.  Neck: Normal range of motion. Neck supple. No JVD present.  Cardiovascular: Regular rhythm.  tachycardic  Pulmonary/Chest:  Kussmaul breathing  Abdominal: Soft. Bowel sounds are normal. He exhibits no distension. There is no tenderness.  Musculoskeletal: Normal range of motion.  Neurological: He is oriented to person, place, and time.     ED Treatments / Results  Labs (all labs ordered are listed, but only abnormal results are displayed) Labs Reviewed  COMPREHENSIVE METABOLIC PANEL -  Abnormal; Notable for the following components:      Result Value   Chloride 92 (*)    CO2 21 (*)    Glucose, Bld 246 (*)    BUN 29 (*)    Creatinine, Ser 1.74 (*)    Total Protein 8.4 (*)    AST 14 (*)    ALT 15 (*)    Alkaline Phosphatase 128 (*)    Total Bilirubin 1.8 (*)    GFR calc non Af Amer 45 (*)    GFR calc Af Amer 52 (*)    Anion gap 22 (*)    All other components within normal limits  CBC - Abnormal; Notable for the following components:   WBC 19.3 (*)    Hemoglobin 17.3 (*)    All other components within normal limits  CBG MONITORING, ED - Abnormal; Notable for the following components:   Glucose-Capillary 246 (*)    All other components within normal limits  CBG MONITORING, ED - Abnormal; Notable for the following components:   Glucose-Capillary 251 (*)    All other components within normal limits  CBG MONITORING, ED - Abnormal; Notable for the following components:   Glucose-Capillary 241 (*)    All other components within normal limits  I-STAT CG4 LACTIC ACID, ED - Abnormal; Notable for the following components:   Lactic Acid, Venous 2.23 (*)    All other components within normal limits  CULTURE,  BLOOD (ROUTINE X 2)  CULTURE, BLOOD (ROUTINE X 2)  LIPASE, BLOOD  URINALYSIS, ROUTINE W REFLEX MICROSCOPIC    EKG  EKG Interpretation None       Radiology No results found.  Procedures Procedures (including critical care time)  Medications Ordered in ED Medications  sodium chloride 0.9 % bolus 1,000 mL (1,000 mLs Intravenous New Bag/Given 05/30/17 2113)    And  sodium chloride 0.9 % bolus 1,000 mL (not administered)    And  sodium chloride 0.9 % bolus 500 mL (not administered)  piperacillin-tazobactam (ZOSYN) IVPB 3.375 g (not administered)  metoCLOPramide (REGLAN) injection 10 mg (not administered)  diphenhydrAMINE (BENADRYL) injection 25 mg (not administered)  vancomycin (VANCOCIN) 1,250 mg in sodium chloride 0.9 % 250 mL IVPB (not administered)  vancomycin (VANCOCIN) IVPB 1000 mg/200 mL premix (not administered)  piperacillin-tazobactam (ZOSYN) IVPB 3.375 g (not administered)  ondansetron (ZOFRAN-ODT) disintegrating tablet 4 mg (4 mg Oral Given 05/30/17 1228)     Initial Impression / Assessment and Plan / ED Course  I have reviewed the triage vital signs and the nursing notes.  Pertinent labs & imaging results that were available during my care of the patient were reviewed by me and considered in my medical decision making (see chart for details).  Clinical Course as of May 31 2255  Mon May 30, 2017  2106 aki Creatinine: (!) 1.74 [AH]  2106 AGMA- with elevated whict  count  Anion gap: (!) 22 [AH]  2136 Patient on empagliflozin, which could be causing his euglycemic DKA.  [AH]    Clinical Course User Index [AH] Arthor Captain, PA-C      Final Clinical Impressions(s) / ED Diagnoses   Final diagnoses:  High anion gap metabolic acidosis  Leukocytosis, unspecified type    ED Discharge Orders    None       Arthor Captain, PA-C 05/30/17 2301    Arthor Captain, PA-C 06/03/17 0125    Nira Conn, MD 06/04/17 [id number]

## 2017-05-30 NOTE — Progress Notes (Signed)
Pharmacy Antibiotic Note  [name] [name] is a 46 y.o. male recently discharged on 12/29 after being treated fr DKA and vomiting who represented on 05/30/2017 with N/V/abd pain concerning for sepsis. Pharmacy has been consulted for Vancomycin + Zosyn dosing.  The patient is noted to have a SCr of 1.74 which is almost double from what was noted on his most recent admission. I appears as if the patient's baseline is between 0.8-0.9. Will dose cautiously until trends in SCr can be seen.   Plan: 1. Vancomycin 1250 mg IV x 1 followed by 1g IV every 24 hours 2. Zosyn 3.375g IV every 8 hours 3. Will continue to follow renal function, culture results, LOT, and antibiotic de-escalation plans    Temp (24hrs), Avg:98.8 F (37.1 C), Min:98.8 F (37.1 C), Max:98.8 F (37.1 C)  Recent Labs  Lab 05/24/17 0352 05/24/17 1000 05/25/17 0206 05/27/17 0843  05/27/17 1944 05/28/17 0035 05/28/17 0347 05/28/17 0744 05/30/17 1235  WBC 14.9*  --  13.0* 16.0*  --   --   --  10.7*  --  19.3*  CREATININE 1.34* 0.92 0.85 1.36*   < > 0.96 0.97 0.88 1.00 1.74*  LATICACIDVEN  --  1.2  --   --   --   --   --   --   --   --    < > = values in this interval not displayed.    Estimated Creatinine Clearance: 50.8 mL/min (A) (by C-G formula based on SCr of 1.74 mg/dL (H)).    Allergies  Allergen Reactions  . Omeprazole Magnesium Swelling    Face swells, no breathing impairment  . Esomeprazole Swelling    Face swells, no breathing impairment    Antimicrobials this admission: Vanc 12/31 >> Zosyn 12/31 >>  Dose adjustments this admission: n/a  Microbiology results: 12/31 BCx >>  Thank you for allowing pharmacy to be a part of this patient's care.  Georgina PillionElizabeth Aryelle Figg, PharmD, BCPS Clinical Pharmacist Pager: [phone] Clinical phone for 05/30/2017: [phone] 05/30/2017 9:06 PM

## 2017-05-30 NOTE — H&P (Signed)
TRH H&P   Patient Demographics:    [name] [name], is a 46 y.o. male  MRN: [id number]   DOB - 1970-12-01  Admit Date - 05/30/2017  Outpatient Primary MD for the patient is Shanon Rosser, PA-C  Referring MD/NP/PA:  Margarita Mail  Outpatient Specialists:   Patient coming from: home  Chief Complaint  Patient presents with  . Emesis  . Diarrhea      HPI:    [name] [name]  is a 46 y.o. male, w hypertension, hyperlipidemia, Dm2 apparently c/o n/v for the past few days.  Pt presents due to the n/v.  Pt denies fever, chill, cp, palp, sob, abd pain, diarrhea, brbpr, black stool.  Pt was recently discharge on 12/29 for DKA  In Ed,   Bun 29, Creatinine 1.74,  Ast 14, Alt 15, Alk phos 128, T. Bili 1.8 Glucose 246 AG 22, Hco3 21 CXR IMPRESSION: No active cardiopulmonary disease.  Pt will be admitted for DKA and ARF      Review of systems:    In addition to the HPI above,  No Fever-chills, No Headache, No changes with Vision or hearing, No problems swallowing food or Liquids, No Chest pain, Cough or Shortness of Breath, No Abdominal pain,  No Blood in stool or Urine, No dysuria, No new skin rashes or bruises, No new joints pains-aches,  No new weakness, tingling, numbness in any extremity, No recent weight gain or loss, No polyuria, polydypsia or polyphagia, No significant Mental Stressors.  A full 10 point Review of Systems was done, except as stated above, all other Review of Systems were negative.   With Past History of the following :    Past Medical History:  Diagnosis Date  . Atypical chest pain   . Diabetes mellitus without complication ([place])   . Hyperlipemia   . Hypertension   . Malaise and fatigue       Past Surgical History:  Procedure Laterality Date  . [place]  . APPENDECTOMY  1975  . CARDIAC CATHETERIZATION N/A 03/28/2015   Procedure: Left Heart Cath and Coronary Angiography;  Surgeon: Adrian Prows, MD;  Location: [facility];  Service: Cardiovascular;  Laterality: N/A;  . CARDIAC CATHETERIZATION N/A 03/28/2015   Procedure: Intravascular Pressure Wire/FFR Study;  Surgeon: Adrian Prows, MD;  Location: [place] [facility];  Service: Cardiovascular;  Laterality: N/A;  . CARPAL TUNNEL RELEASE Left   . ELBOW FRACTURE SURGERY Left   . PILONIDAL CYST EXCISION N/A 03/24/2017   Procedure: EXCISION CHRONIC  PILONIDAL ABSCESS;  Surgeon: Coralie Keens, MD;  Location: [facility];  Service: General;  Laterality: N/A;      Social History:     Social History   Tobacco Use  . Smoking status: Current Every Day Smoker    Packs/day: 1.00    Years: 29.00    Pack years: 29.00  Types: Cigarettes  . Smokeless tobacco: Never Used  Substance Use Topics  . Alcohol use: No    Alcohol/week: 0.0 oz     Lives - w parents  Mobility - walks by self   Family History :     Family History  Problem Relation Age of Onset  . Congestive Heart Failure Sister   . Diabetes Mother   . Hypertension Mother   . Diabetes Father   . Hypertension Father       Home Medications:   Prior to Admission medications   Medication Sig Start Date End Date Taking? Authorizing Provider  acetaminophen (TYLENOL) 500 MG tablet Take 1 tablet (500 mg total) by mouth every 6 (six) hours as needed. Patient taking differently: Take 500 mg by mouth every 6 (six) hours as needed (for pain or headaches).  05/28/17  Yes Florencia Reasons, MD  ALPRAZolam Duanne Moron) 1 MG tablet Take 0.5-1 mg by mouth 2 (two) times daily as needed for anxiety.  05/12/17  Yes [provider]  aspirin 81 MG chewable tablet Chew 81 mg by mouth at bedtime.    Yes [provider]  atorvastatin (LIPITOR) 40 MG tablet Take 40 mg by mouth daily.  07/29/14  Yes [provider]  azelastine (OPTIVAR) 0.05 % ophthalmic solution Place 1 drop into both eyes 2 (two) times  daily.   Yes [provider]  buPROPion (WELLBUTRIN SR) 100 MG 12 hr tablet Take 100 mg by mouth 2 (two) times daily.    Yes [provider]  Cholecalciferol (VITAMIN D3) 5000 units TABS Take 5,000 Units by mouth daily.   Yes [provider]  Empagliflozin-Linagliptin (GLYXAMBI) 10-5 MG TABS Take 1 tablet by mouth daily before breakfast.    Yes [provider]  glimepiride (AMARYL) 4 MG tablet Take 4 mg by mouth daily with breakfast.   Yes [provider]  Insulin Degludec (TRESIBA FLEXTOUCH) 200 UNIT/ML SOPN Inject 6 Units into the skin at bedtime. Patient taking differently: Inject 4-6 Units into the skin at bedtime.  05/25/17  Yes Bonnielee Haff, MD  insulin lispro (HUMALOG KWIKPEN) 100 UNIT/ML KiwkPen Inject 2-6 Units into the skin See admin instructions. 3-6 units in the evening, per sliding scale: BGL >100 = 2 units; >200 = 4 units; >300 = 6 units   Yes [provider]  losartan (COZAAR) 100 MG tablet Take 100 mg by mouth daily.   Yes [provider]  metFORMIN (GLUCOPHAGE-XR) 500 MG 24 hr tablet Take 500 mg by mouth 2 (two) times daily.   Yes [provider]  multivitamin-lutein (OCUVITE-LUTEIN) CAPS capsule Take 1 capsule by mouth daily.   Yes [provider]  neomycin-bacitracin-polymyxin (NEOSPORIN) ointment Apply 1 application topically 3 (three) times daily. To the surgical site on your back 05/25/17  Yes Bonnielee Haff, MD  ranitidine (ZANTAC) 150 MG tablet Take 150 mg by mouth at bedtime.    Yes [provider]  sertraline (ZOLOFT) 100 MG tablet Take 200 mg by mouth at bedtime.    Yes [provider]  vitamin B-12 (CYANOCOBALAMIN) 1000 MCG tablet Take 1,000 mcg by mouth daily.   Yes [provider]  metFORMIN (GLUCOPHAGE) 500 MG tablet Take 1 tablet (500 mg total) by mouth 2 (two) times daily with a meal. Patient not taking: Reported on 05/30/2017 12/01/15   Reyne Dumas, MD    sucralfate (CARAFATE) 1 g tablet Take 1 tablet (1 g total) by mouth 4 (four) times daily for 14 days.  05/28/17 06/11/17  Florencia Reasons, MD     Allergies:     Allergies  Allergen Reactions  . Omeprazole Magnesium Swelling    Face swells, no breathing impairment  . Esomeprazole Swelling    Face swells, no breathing impairment     Physical Exam:   Vitals  Blood pressure 140/87, pulse 100, temperature 98.8 F (37.1 C), temperature source Oral, resp. rate 18, SpO2 96 %.   1. General  lying in bed in NAD,   2. Normal affect and insight, Not Suicidal or Homicidal, Awake Alert, Oriented X 3.  3. No F.N deficits, ALL C.Nerves Intact, Strength 5/5 all 4 extremities, Sensation intact all 4 extremities, Plantars down going.  4. Ears and Eyes appear Normal, Conjunctivae clear, PERRLA. Moist Oral Mucosa.  5. Supple Neck, No JVD, No cervical lymphadenopathy appriciated, No Carotid Bruits.  6. Symmetrical Chest wall movement, Good air movement bilaterally, CTAB.  7. RRR, No Gallops, Rubs or Murmurs, No Parasternal Heave.  8. Positive Bowel Sounds, Abdomen Soft, No tenderness, No organomegaly appriciated,No rebound -guarding or rigidity.  9.  No Cyanosis, Normal Skin Turgor, No Skin Rash or Bruise.  10. Good muscle tone,  joints appear normal , no effusions, Normal ROM.  11. No Palpable Lymph Nodes in Neck or Axillae  tatoo   Data Review:    CBC Recent Labs  Lab 05/24/17 0352 05/25/17 0206 05/27/17 0843 05/28/17 0347 05/30/17 1235  WBC 14.9* 13.0* 16.0* 10.7* 19.3*  HGB 14.9 14.4 16.5 15.1 17.3*  HCT 43.5 42.8 51.2 44.1 50.0  PLT 245 238 244 237 325  MCV 91.8 93.0 99.2 91.7 91.7  MCH 31.4 31.3 32.0 31.4 31.7  MCHC 34.3 33.6 32.2 34.2 34.6  RDW 12.7 12.9 13.3 12.5 12.6  LYMPHSABS 1.1  --   --   --   --   MONOABS 0.8  --   --   --   --   EOSABS 0.0  --   --   --   --   BASOSABS 0.0  --   --   --   --     ------------------------------------------------------------------------------------------------------------------  Chemistries  Recent Labs  Lab 05/24/17 0352  05/27/17 0843 05/27/17 0853  05/28/17 0035 05/28/17 0347 05/28/17 0744 05/30/17 1235 05/30/17 2241  NA 137   < > 132*  --    < > 135 133* 133* 135 135  K 4.3   < > 4.4  --    < > 4.1 4.0 4.2 4.5 4.1  CL 104   < > 92*  --    < > 106 98* 100* 92* 98*  CO2 17*   < > 15*  --    < > 20* 22 24 21* 18*  GLUCOSE 308*   < > 343*  --    < > 126* 161* 187* 246* 211*  BUN 21*   < > 29*  --    < > _0 29* 38*  CREATININE 1.34*   < > 1.36*  --    < > 0.97 0.88 1.00 1.74* 1.91*  CALCIUM 8.4*   < > 9.2  --    < > 8.0* 8.5* 8.8* 9.6 8.0*  MG  --   --   --  2.0  --   --   --   --   --   --   AST 18  --  18  --   --   --   --   --  14*  --   ALT 19  --  17  --   --   --   --   --  15*  --   ALKPHOS 108  --  116  --   --   --   --   --  128*  --   BILITOT 1.7*  --  2.3*  --   --   --   --   --  1.8*  --    < > = values in this interval not displayed.   ------------------------------------------------------------------------------------------------------------------ estimated creatinine clearance is 46.3 mL/min (A) (by C-G formula based on SCr of 1.91 mg/dL (H)). ------------------------------------------------------------------------------------------------------------------ No results for input(s): TSH, T4TOTAL, T3FREE, THYROIDAB in the last 72 hours.  Invalid input(s): FREET3  Coagulation profile Recent Labs  Lab 05/24/17 0239  INR 1.10   ------------------------------------------------------------------------------------------------------------------- No results for input(s): DDIMER in the last 72 hours. -------------------------------------------------------------------------------------------------------------------  Cardiac Enzymes Recent Labs  Lab 05/24/17 0352 05/24/17 1000 05/24/17 1450  TROPONINI <0.03 <0.03  <0.03   ------------------------------------------------------------------------------------------------------------------ No results found for: BNP   ---------------------------------------------------------------------------------------------------------------  Urinalysis    Component Value Date/Time   COLORURINE YELLOW 05/27/2017 [place] 05/27/2017 0843   LABSPEC 1.026 05/27/2017 0843   PHURINE 5.0 05/27/2017 0843   GLUCOSEU >=500 (A) 05/27/2017 0843   HGBUR SMALL (A) 05/27/2017 0843   BILIRUBINUR NEGATIVE 05/27/2017 0843   KETONESUR 20 (A) 05/27/2017 0843   PROTEINUR NEGATIVE 05/27/2017 0843   NITRITE NEGATIVE 05/27/2017 0843   LEUKOCYTESUR NEGATIVE 05/27/2017 0843    ----------------------------------------------------------------------------------------------------------------   Imaging Results:    Dg Chest 2 View  Result Date: 05/30/2017 CLINICAL DATA:  Nausea, vomiting, and abdominal pain since 12/24. Admitted on 12/28 for same symptoms. EXAM: CHEST  2 VIEW COMPARISON:  05/24/2017 FINDINGS: The heart size and mediastinal contours are within normal limits. Both lungs are clear. The visualized skeletal structures are unremarkable. IMPRESSION: No active cardiopulmonary disease. Electronically Signed   By: Lucienne Capers M.D.   On: 05/30/2017 21:53      Assessment & Plan:    Principal Problem:   DKA (diabetic ketoacidoses) ([place]) Active Problems:   ARF (acute renal failure) ([facility])    DKA Ns iv Insulin iv  ARF Hydrate with ns iv STOP losartan Check urine sodium, urine creatinine , urine eosinophils Check renal ultrasound  N/v Trop I q6h x3 zofran Consider GI consultation in Am for further evaluation.    Dm2 Stop glyambi, amaryl, humalog, metformin, and tresiba Use IV insulin as above  Hyperlipidemia Cont lipitor  Hypertension Stop Losartran    DVT Prophylaxis Heparin -  SCDs   AM Labs Ordered, also please review Full  Orders  Family Communication: Admission, patients condition and plan of care including tests being ordered have been discussed with the patient  who indicate understanding and agree with the plan and Code Status.  Code Status FULL CODE  Likely DC to  home  Condition GUARDED    Consults called: none  Admission status: inpatient   Time spent in minutes : 45   Jani Gravel M.D on 05/30/2017 at 11:35 PM  Between 7am to 7pm - Pager - [phone]. After 7pm go to [email or web address] - password [facility]  [facility] - Office  [phone]

## 2017-05-30 NOTE — ED Triage Notes (Signed)
Pt reports feeling sick with n/v/d and abd pain since 12/24. Pt was admitted on 12/28 for same.

## 2017-05-30 NOTE — ED Notes (Signed)
[name]-PA notified of elevated CG-4 

## 2017-05-30 NOTE — ED Notes (Signed)
Patient transported to X-ray 

## 2017-05-31 ENCOUNTER — Other Ambulatory Visit: Payer: Self-pay

## 2017-05-31 DIAGNOSIS — D72829 Elevated white blood cell count, unspecified: Secondary | ICD-10-CM

## 2017-05-31 LAB — URINALYSIS, ROUTINE W REFLEX MICROSCOPIC
Bacteria, UA: NONE SEEN
Bilirubin Urine: NEGATIVE
KETONES UR: 80 mg/dL — AB
Leukocytes, UA: NEGATIVE
Nitrite: NEGATIVE
Protein, ur: NEGATIVE mg/dL
SPECIFIC GRAVITY, URINE: 1.021 (ref 1.005–1.030)
SQUAMOUS EPITHELIAL / LPF: NONE SEEN
pH: 5 (ref 5.0–8.0)

## 2017-05-31 LAB — I-STAT CG4 LACTIC ACID, ED: LACTIC ACID, VENOUS: 1.26 mmol/L (ref 0.5–1.9)

## 2017-05-31 LAB — BASIC METABOLIC PANEL
ANION GAP: 12 (ref 5–15)
ANION GAP: 12 (ref 5–15)
ANION GAP: 18 — AB (ref 5–15)
BUN: 24 mg/dL — ABNORMAL HIGH (ref 6–20)
BUN: 28 mg/dL — ABNORMAL HIGH (ref 6–20)
BUN: 32 mg/dL — ABNORMAL HIGH (ref 6–20)
CALCIUM: 7.7 mg/dL — AB (ref 8.9–10.3)
CALCIUM: 8 mg/dL — AB (ref 8.9–10.3)
CALCIUM: 8.1 mg/dL — AB (ref 8.9–10.3)
CHLORIDE: 99 mmol/L — AB (ref 101–111)
CO2: 18 mmol/L — AB (ref 22–32)
CO2: 21 mmol/L — AB (ref 22–32)
CO2: 24 mmol/L (ref 22–32)
Chloride: 102 mmol/L (ref 101–111)
Chloride: 100 mmol/L — ABNORMAL LOW (ref 101–111)
Creatinine, Ser: 1.29 mg/dL — ABNORMAL HIGH (ref 0.61–1.24)
Creatinine, Ser: 1.38 mg/dL — ABNORMAL HIGH (ref 0.61–1.24)
Creatinine, Ser: 1.55 mg/dL — ABNORMAL HIGH (ref 0.61–1.24)
GFR calc non Af Amer: 52 mL/min — ABNORMAL LOW (ref >60)
GFR calc non Af Amer: 60 mL/min — ABNORMAL LOW (ref >60)
GFR, EST AFRICAN AMERICAN: 60 mL/min — AB (ref >60)
Glucose, Bld: 127 mg/dL — ABNORMAL HIGH (ref 65–99)
Glucose, Bld: 155 mg/dL — ABNORMAL HIGH (ref 65–99)
Glucose, Bld: 208 mg/dL — ABNORMAL HIGH (ref 65–99)
Potassium: 4.2 mmol/L (ref 3.5–5.1)
Potassium: 4.3 mmol/L (ref 3.5–5.1)
Potassium: 4.6 mmol/L (ref 3.5–5.1)
SODIUM: 136 mmol/L (ref 135–145)
Sodium: 135 mmol/L (ref 135–145)
Sodium: 135 mmol/L (ref 135–145)

## 2017-05-31 LAB — GLUCOSE, CAPILLARY
GLUCOSE-CAPILLARY: 113 mg/dL — AB (ref 65–99)
GLUCOSE-CAPILLARY: 140 mg/dL — AB (ref 65–99)
GLUCOSE-CAPILLARY: 141 mg/dL — AB (ref 65–99)
Glucose-Capillary: 137 mg/dL — ABNORMAL HIGH (ref 65–99)
Glucose-Capillary: 127 mg/dL — ABNORMAL HIGH (ref 65–99)
Glucose-Capillary: 108 mg/dL — ABNORMAL HIGH (ref 65–99)
Glucose-Capillary: 156 mg/dL — ABNORMAL HIGH (ref 65–99)

## 2017-05-31 LAB — CBG MONITORING, ED
GLUCOSE-CAPILLARY: 168 mg/dL — AB (ref 65–99)
GLUCOSE-CAPILLARY: 195 mg/dL — AB (ref 65–99)
GLUCOSE-CAPILLARY: 196 mg/dL — AB (ref 65–99)
Glucose-Capillary: 152 mg/dL — ABNORMAL HIGH (ref 65–99)
Glucose-Capillary: 139 mg/dL — ABNORMAL HIGH (ref 65–99)
Glucose-Capillary: 141 mg/dL — ABNORMAL HIGH (ref 65–99)

## 2017-05-31 LAB — I-STAT CHEM 8, ED
BUN: 42 mg/dL — ABNORMAL HIGH (ref 6–20)
CHLORIDE: 101 mmol/L (ref 101–111)
CREATININE: 1.3 mg/dL — AB (ref 0.61–1.24)
Calcium, Ion: 0.97 mmol/L — ABNORMAL LOW (ref 1.15–1.40)
GLUCOSE: 212 mg/dL — AB (ref 65–99)
HCT: 48 % (ref 39.0–52.0)
Hemoglobin: 16.3 g/dL (ref 13.0–17.0)
Potassium: 4.8 mmol/L (ref 3.5–5.1)
Sodium: 136 mmol/L (ref 135–145)
TCO2: 21 mmol/L — ABNORMAL LOW (ref 22–32)

## 2017-05-31 MED ORDER — HYDRALAZINE HCL 20 MG/ML IJ SOLN
10.0000 mg | Freq: Four times a day (QID) | INTRAMUSCULAR | Status: DC | PRN
  Administered 2017-05-31 – 2017-06-01 (×3): 10 mg via INTRAVENOUS
  Filled 2017-05-31 (×4): qty 1

## 2017-05-31 MED ORDER — LOSARTAN POTASSIUM 50 MG PO TABS
100.0000 mg | ORAL_TABLET | Freq: Every day | ORAL | Status: DC
  Administered 2017-05-31 – 2017-06-02 (×3): 100 mg via ORAL
  Filled 2017-05-31 (×3): qty 2

## 2017-05-31 MED ORDER — INSULIN ASPART 100 UNIT/ML ~~LOC~~ SOLN: 0.0000 [IU] | Freq: Three times a day (TID) | SUBCUTANEOUS | Status: DC

## 2017-05-31 MED ORDER — INSULIN ASPART 100 UNIT/ML ~~LOC~~ SOLN: 0.0000 [IU] | Freq: Every day | SUBCUTANEOUS | Status: DC

## 2017-05-31 MED ORDER — INSULIN ASPART 100 UNIT/ML ~~LOC~~ SOLN
0.0000 [IU] | Freq: Three times a day (TID) | SUBCUTANEOUS | Status: DC
Start: 2017-05-31 — End: 2017-06-02
  Administered 2017-05-31 – 2017-06-02 (×5): 1 [IU] via SUBCUTANEOUS

## 2017-05-31 MED ORDER — INSULIN GLARGINE 100 UNIT/ML ~~LOC~~ SOLN
8.0000 [IU] | Freq: Once | SUBCUTANEOUS | Status: AC
  Administered 2017-05-31: 8 [IU] via SUBCUTANEOUS
  Filled 2017-05-31: qty 0.08

## 2017-05-31 MED ORDER — METOCLOPRAMIDE HCL 5 MG/ML IJ SOLN
5.0000 mg | Freq: Four times a day (QID) | INTRAMUSCULAR | Status: DC
  Administered 2017-05-31 – 2017-06-01 (×2): 5 mg via INTRAVENOUS
  Filled 2017-05-31 (×3): qty 2

## 2017-05-31 MED ORDER — METOPROLOL TARTRATE 12.5 MG HALF TABLET
12.5000 mg | ORAL_TABLET | Freq: Two times a day (BID) | ORAL | Status: DC
  Administered 2017-05-31 – 2017-06-01 (×2): 12.5 mg via ORAL
  Filled 2017-05-31 (×3): qty 1

## 2017-05-31 MED ORDER — ENOXAPARIN SODIUM 40 MG/0.4ML ~~LOC~~ SOLN
40.0000 mg | SUBCUTANEOUS | Status: DC
  Administered 2017-05-31 – 2017-06-02 (×3): 40 mg via SUBCUTANEOUS
  Filled 2017-05-31 (×3): qty 0.4

## 2017-05-31 MED ORDER — SODIUM CHLORIDE 0.9 % IV SOLN
INTRAVENOUS | Status: DC
  Administered 2017-05-31 – 2017-06-01 (×2): via INTRAVENOUS

## 2017-05-31 NOTE — Progress Notes (Signed)
[facility] TEAM 1 - Stepdown/ICU TEAM  AMY BELLOSO  ZOX:[id number] DOB: [birth year] DOA: 05/30/2017 PCP: Lindaann Pascal, PA-C    Brief Narrative:  47 y.o. male w/ a hx of HTN, HLD, and DM2 who presented w/ recurrent n/v and was once again found to be in DKA.  He was admitted for DKA 12/25 > 12/26, and again 12/28 > 12/29.  His most recent prior admit for DKA was in May 2018.  Significant Events: 12/31 admit   Subjective: Pt reports ongoing abdom discomfort.  Denies vomiting or signif nausea.  Denies cp, sob, LE swelling or cramping.    Assessment & Plan:  DKA - uncontrolled DM2 Rapidly corrected w/ IV insulin suggesting noncompliance as outpt - now off insulin gtt - follow CBG closely  Abdom pain - N/V Suspect this is due to poor GI motility due to severely uncontrolled DM - avoid narcotics - control CBG - trial of reglan SHORT TERM until CBG controlled more consistently   HTN BP poorly controlled - adjust tx and follow  HLD Cont usual med tx  Sinus tachy Felt to be due to DKA and pain/anxiety - no CP or LE sx to suggest PE/DVT  MRSA screen +  DVT prophylaxis: lovenox  Code Status: FULL CODE Family Communication: no family present at time of exam  Disposition Plan: transfer to tele bed   Consultants:  none  Antimicrobials:  none   Objective: Blood pressure (!) 174/98, pulse (!) 106, temperature 99 F (37.2 C), temperature source Oral, resp. rate (!) 21, height 5\' 8"  (1.727 m), weight 77.1 kg (170 lb), SpO2 96 %.  Intake/Output Summary (Last 24 hours) at 05/31/2017 0851 Last data filed at 05/31/2017 0700 Gross per 24 hour  Intake 3480.26 ml  Output -  Net 3480.26 ml   Filed Weights   05/31/17 0317  Weight: 77.1 kg (170 lb)    Examination: General: No acute respiratory distress Lungs: Clear to auscultation bilaterally without wheezes or crackles Cardiovascular: tachycardic - regular - no M or rub  Abdomen: Nontender, nondistended, soft, bowel sounds positive,  no rebound, no ascites, no appreciable mass Extremities: No significant cyanosis, clubbing, or edema bilateral lower extremities  CBC: Recent Labs  Lab 05/25/17 0206 05/27/17 0843 05/28/17 0347 05/30/17 1235 05/31/17 0016  WBC 13.0* 16.0* 10.7* 19.3*  --   HGB 14.4 16.5 15.1 17.3* 16.3  HCT 42.8 51.2 44.1 50.0 48.0  MCV 93.0 99.2 91.7 91.7  --   PLT 238 244 237 325  --    Basic Metabolic Panel: Recent Labs  Lab 05/27/17 0853  05/30/17 1235 05/30/17 2241 05/30/17 2358 05/31/17 0016 05/31/17 0336 05/31/17 0654  NA  --    < > 135 135 135 136 135 136  K  --    < > 4.5 4.1 4.6 4.8 4.3 4.2  CL  --    < > 92* 98* 99* 101 102 100*  CO2  --    < > 21* 18* 18*  --  21* 24  GLUCOSE  --    < > 246* 211* 208* 212* 155* 127*  BUN  --    < > 29* 38* 32* 42* 28* 24*  CREATININE  --    < > 1.74* 1.91* 1.55* 1.30* 1.38* 1.29*  CALCIUM  --    < > 9.6 8.0* 7.7*  --  8.0* 8.1*  MG 2.0  --   --   --   --   --   --   --    < > =  values in this interval not displayed.   GFR: Estimated Creatinine Clearance: 68.5 mL/min (A) (by C-G formula based on SCr of 1.29 mg/dL (H)).  Liver Function Tests: Recent Labs  Lab 05/27/17 0843 05/30/17 1235  AST 18 14*  ALT 17 15*  ALKPHOS 116 128*  BILITOT 2.3* 1.8*  PROT 7.6 8.4*  ALBUMIN 3.5 4.0   Recent Labs  Lab 05/27/17 0843 05/30/17 1235  LIPASE 16 22   Cardiac Enzymes: Recent Labs  Lab 05/24/17 1000 05/24/17 1450  TROPONINI <0.03 <0.03    HbA1C: Hgb A1c MFr Bld  Date/Time Value Ref Range Status  05/24/2017 10:00 AM 8.7 (H) 4.8 - 5.6 % Final    Comment:    (NOTE) Pre diabetes:          5.7%-6.4% Diabetes:              >6.4% Glycemic control for   <7.0% adults with diabetes   03/22/2017 03:08 PM 11.2 (H) 4.8 - 5.6 % Final    Comment:    (NOTE)         Prediabetes: 5.7 - 6.4         Diabetes: >6.4         Glycemic control for adults with diabetes: <7.0     CBG: Recent Labs  Lab 05/31/17 0457 05/31/17 0515  05/31/17 0622 05/31/17 0737 05/31/17 0844  GLUCAP 139* 141* 137* 127* 108*    Recent Results (from the past 240 hour(s))  MRSA PCR Screening     Status: None   Collection Time: 05/24/17  6:59 AM  Result Value Ref Range Status   MRSA by PCR NEGATIVE NEGATIVE Final    Comment:        The GeneXpert MRSA Assay (FDA approved for NASAL specimens only), is one component of a comprehensive MRSA colonization surveillance program. It is not intended to diagnose MRSA infection nor to guide or monitor treatment for MRSA infections.   Culture, blood (routine x 2)     Status: None   Collection Time: 05/24/17 10:00 AM  Result Value Ref Range Status   Specimen Description BLOOD RIGHT FOREARM  Final   Special Requests   Final    BOTTLES DRAWN AEROBIC AND ANAEROBIC Blood Culture adequate volume   Culture NO GROWTH 5 DAYS  Final   Report Status 05/29/2017 FINAL  Final  Culture, blood (routine x 2)     Status: None   Collection Time: 05/24/17 10:15 AM  Result Value Ref Range Status   Specimen Description BLOOD RIGHT HAND  Final   Special Requests   Final    BOTTLES DRAWN AEROBIC AND ANAEROBIC Blood Culture adequate volume   Culture NO GROWTH 5 DAYS  Final   Report Status 05/29/2017 FINAL  Final  MRSA PCR Screening     Status: Abnormal   Collection Time: 05/27/17  3:44 PM  Result Value Ref Range Status   MRSA by PCR POSITIVE (A) NEGATIVE Final    Comment:        The GeneXpert MRSA Assay (FDA approved for NASAL specimens only), is one component of a comprehensive MRSA colonization surveillance program. It is not intended to diagnose MRSA infection nor to guide or monitor treatment for MRSA infections. RESULT CALLED TO, READ BACK BY AND VERIFIED WITH: RN Angeline Slim [id number]      Scheduled Meds: . aspirin  81 mg Oral QHS  . atorvastatin  40 mg Oral Daily  . buPROPion  100 mg Oral  BID  . cholecalciferol  5,000 Units Oral Daily  . enoxaparin (LOVENOX) injection  40 mg  Subcutaneous Q24H  . famotidine  10 mg Oral BID  . multivitamin  1 tablet Oral Daily  . sertraline  200 mg Oral QHS  . sucralfate  1 g Oral QID  . vitamin B-12  1,000 mcg Oral Daily     LOS: 1 day   Lonia BloodJeffrey T. Keighan Amezcua, MD [facility] Office  [phone] Pager - Text Page per Amion as per below:  On-Call/Text Page:      [email or web address]      password [email or web address]  If 7PM-7AM, please contact night-coverage [email or web address] Password [email or web address] 05/31/2017, 8:51 AM

## 2017-05-31 NOTE — Progress Notes (Signed)
MD updated regarding patient progress, new orders received for insulin, fluids, diet order.

## 2017-05-31 NOTE — Progress Notes (Signed)
Pt arrived to unit and ambulated to bed. Identified appropriately and assessed. Initiated contact precautions and placed on telemetry. Pt denies any pain or nausea at the moment. Oriented to room and unit, call bell within reach. Will continue to monitor.

## 2017-06-01 DIAGNOSIS — E872 Acidosis: Secondary | ICD-10-CM

## 2017-06-01 LAB — BASIC METABOLIC PANEL
ANION GAP: 9 (ref 5–15)
BUN: 14 mg/dL (ref 6–20)
CO2: 20 mmol/L — ABNORMAL LOW (ref 22–32)
Calcium: 8.2 mg/dL — ABNORMAL LOW (ref 8.9–10.3)
Chloride: 104 mmol/L (ref 101–111)
Creatinine, Ser: 1.17 mg/dL (ref 0.61–1.24)
Glucose, Bld: 123 mg/dL — ABNORMAL HIGH (ref 65–99)
Potassium: 4.3 mmol/L (ref 3.5–5.1)
Sodium: 133 mmol/L — ABNORMAL LOW (ref 135–145)

## 2017-06-01 LAB — CBC
HCT: 42.1 % (ref 39.0–52.0)
Hemoglobin: 14 g/dL (ref 13.0–17.0)
MCH: 30.9 pg (ref 26.0–34.0)
MCHC: 33.3 g/dL (ref 30.0–36.0)
MCV: 92.9 fL (ref 78.0–100.0)
Platelets: 262 10*3/uL (ref 150–400)
RBC: 4.53 MIL/uL (ref 4.22–5.81)
RDW: 12.7 % (ref 11.5–15.5)
WBC: 11.3 10*3/uL — ABNORMAL HIGH (ref 4.0–10.5)

## 2017-06-01 LAB — GLUCOSE, CAPILLARY
GLUCOSE-CAPILLARY: 132 mg/dL — AB (ref 65–99)
GLUCOSE-CAPILLARY: 123 mg/dL — AB (ref 65–99)
GLUCOSE-CAPILLARY: 147 mg/dL — AB (ref 65–99)
GLUCOSE-CAPILLARY: 135 mg/dL — AB (ref 65–99)
Glucose-Capillary: 114 mg/dL — ABNORMAL HIGH (ref 65–99)

## 2017-06-01 MED ORDER — METOPROLOL TARTRATE 25 MG PO TABS
25.0000 mg | ORAL_TABLET | Freq: Two times a day (BID) | ORAL | Status: DC
  Administered 2017-06-01 – 2017-06-02 (×2): 25 mg via ORAL
  Filled 2017-06-01 (×2): qty 1

## 2017-06-01 MED ORDER — METOCLOPRAMIDE HCL 5 MG/ML IJ SOLN
5.0000 mg | Freq: Three times a day (TID) | INTRAMUSCULAR | Status: DC
Start: 2017-06-01 — End: 2017-06-02
  Administered 2017-06-01 (×3): 5 mg via INTRAVENOUS
  Filled 2017-06-01 (×2): qty 2

## 2017-06-01 MED ORDER — FAMOTIDINE 20 MG PO TABS
20.0000 mg | ORAL_TABLET | Freq: Two times a day (BID) | ORAL | Status: DC
  Administered 2017-06-01 – 2017-06-02 (×2): 20 mg via ORAL
  Filled 2017-06-01 (×2): qty 1

## 2017-06-01 MED ORDER — GI COCKTAIL ~~LOC~~
30.0000 mL | Freq: Three times a day (TID) | ORAL | Status: DC | PRN
  Administered 2017-06-01: 30 mL via ORAL
  Filled 2017-06-01: qty 30

## 2017-06-01 MED ORDER — MORPHINE SULFATE (PF) 2 MG/ML IV SOLN
1.0000 mg | INTRAVENOUS | Status: DC | PRN
Start: 2017-06-01 — End: 2017-06-02
  Administered 2017-06-01: 1 mg via INTRAVENOUS
  Filled 2017-06-01: qty 1

## 2017-06-01 NOTE — Progress Notes (Addendum)
[name] [name]  ZOX:[id number] DOB: [birth year] DOA: 05/30/2017 PCP: Lindaann Pascal, PA-C    Brief Narrative:  47 y.o. male w/ a hx of HTN, HLD, and DM2 who presented w/ recurrent n/v and was once again found to be in DKA.  He was admitted for DKA 12/25 > 12/26, and again 12/28 > 12/29.  His most recent prior admit for DKA was in May 2018. improved 06/01/17 transferred from SDU/Team 1 to floor today  Subjective: -Complains of ongoing nausea, was unable to eat much  Assessment & Plan:  DKA - uncontrolled DM2 -Rapidly corrected w/ IV insulin, likely due to poor insulin compliance and he has recurrent nausea and vomiting -possibly from gastroparesis which significantly affects PO intake and hence Insulin use -he is also on GLYXAMBI which is a combination containing Empagliflozin which can cause DKA , hence will stop this at discharge -CBGs now 120-130 range and hence will hold off on restarting tresiba, he takes 6units QHS -continue SSI for now -last Hba1c was 8.7 last week  Recurrent nausea and vomiting -since 2017 reportedly -possibly due to gastroparesis and takes narcotics PRN for his shoulder and back which worsens this -continue reglan change to 5mg  QAC -continue H2 blockers, allergic to PPI -needs gastric emptying scan as outpatient -avodi narcotics -small frequent meals -gastroparesis/bariatric diet  HTN BP poorly controlled -continue losartan, cut down IVF, monitor  Sinus tachycardia Felt to be due to DKA and pain/anxiety - no CP or LE sx to suggest PE/DVT -improving, monitor  Leukocytosis -reactive and from hemoconcentration, improved  MRSA screen +  DVT prophylaxis: lovenox  Code Status: FULL CODE Family Communication: no family present at time of exam  Disposition Plan: home when N/V improved and stable  Consultants:  none  Antimicrobials:  none   Objective: Blood pressure 136/84, pulse (!) 106, temperature 99 F (37.2 C), temperature source Oral, resp.  rate 18, height 5\' 8"  (1.727 m), weight 76.9 kg (169 lb 9.6 oz), SpO2 100 %.  Intake/Output Summary (Last 24 hours) at 06/01/2017 1333 Last data filed at 06/01/2017 1329 Gross per 24 hour  Intake 1320 ml  Output 1325 ml  Net -5 ml   Filed Weights   05/31/17 0317 05/31/17 2019  Weight: 77.1 kg (170 lb) 76.9 kg (169 lb 9.6 oz)    Examination: Gen: Awake, Alert, mild cognitive delay noted, oriented 3 HEENT: PERRLA, Neck supple, no JVD Lungs: Good air movement bilaterally, CTAB CVS: RRR,No Gallops,Rubs or new Murmurs Abd: soft, Non tender, non distended, BS present Extremities: No Cyanosis, Clubbing or edema Skin: no new rashes  CBC: Recent Labs  Lab 05/27/17 0843 05/28/17 0347 05/30/17 1235 05/31/17 0016 06/01/17 0330  WBC 16.0* 10.7* 19.3*  --  11.3*  HGB 16.5 15.1 17.3* 16.3 14.0  HCT 51.2 44.1 50.0 48.0 42.1  MCV 99.2 91.7 91.7  --  92.9  PLT 244 237 325  --  262   Basic Metabolic Panel: Recent Labs  Lab 05/27/17 0853  05/30/17 2241 05/30/17 2358 05/31/17 0016 05/31/17 0336 05/31/17 0654 06/01/17 0330  NA  --    < > 135 135 136 135 136 133*  K  --    < > 4.1 4.6 4.8 4.3 4.2 4.3  CL  --    < > 98* 99* 101 102 100* 104  CO2  --    < > 18* 18*  --  21* 24 20*  GLUCOSE  --    < > 211* 208* 212* 155*  127* 123*  BUN  --    < > 38* 32* 42* 28* 24* 14  CREATININE  --    < > 1.91* 1.55* 1.30* 1.38* 1.29* 1.17  CALCIUM  --    < > 8.0* 7.7*  --  8.0* 8.1* 8.2*  MG 2.0  --   --   --   --   --   --   --    < > = values in this interval not displayed.   GFR: Estimated Creatinine Clearance: 75.5 mL/min (by C-G formula based on SCr of 1.17 mg/dL).  Liver Function Tests: Recent Labs  Lab 05/27/17 0843 05/30/17 1235  AST 18 14*  ALT 17 15*  ALKPHOS 116 128*  BILITOT 2.3* 1.8*  PROT 7.6 8.4*  ALBUMIN 3.5 4.0   Recent Labs  Lab 05/27/17 0843 05/30/17 1235  LIPASE 16 22   Cardiac Enzymes: No results for input(s): CKTOTAL, CKMB, CKMBINDEX, TROPONINI in the last  168 hours.  HbA1C: Hgb A1c MFr Bld  Date/Time Value Ref Range Status  05/24/2017 10:00 AM 8.7 (H) 4.8 - 5.6 % Final    Comment:    (NOTE) Pre diabetes:          5.7%-6.4% Diabetes:              >6.4% Glycemic control for   <7.0% adults with diabetes   03/22/2017 03:08 PM 11.2 (H) 4.8 - 5.6 % Final    Comment:    (NOTE)         Prediabetes: 5.7 - 6.4         Diabetes: >6.4         Glycemic control for adults with diabetes: <7.0     CBG: Recent Labs  Lab 05/31/17 1227 05/31/17 1552 05/31/17 2149 06/01/17 0752 06/01/17 1210  GLUCAP 113* 140* 141* 132* 123*    Recent Results (from the past 240 hour(s))  MRSA PCR Screening     Status: None   Collection Time: 05/24/17  6:59 AM  Result Value Ref Range Status   MRSA by PCR NEGATIVE NEGATIVE Final    Comment:        The GeneXpert MRSA Assay (FDA approved for NASAL specimens only), is one component of a comprehensive MRSA colonization surveillance program. It is not intended to diagnose MRSA infection nor to guide or monitor treatment for MRSA infections.   Culture, blood (routine x 2)     Status: None   Collection Time: 05/24/17 10:00 AM  Result Value Ref Range Status   Specimen Description BLOOD RIGHT FOREARM  Final   Special Requests   Final    BOTTLES DRAWN AEROBIC AND ANAEROBIC Blood Culture adequate volume   Culture NO GROWTH 5 DAYS  Final   Report Status 05/29/2017 FINAL  Final  Culture, blood (routine x 2)     Status: None   Collection Time: 05/24/17 10:15 AM  Result Value Ref Range Status   Specimen Description BLOOD RIGHT HAND  Final   Special Requests   Final    BOTTLES DRAWN AEROBIC AND ANAEROBIC Blood Culture adequate volume   Culture NO GROWTH 5 DAYS  Final   Report Status 05/29/2017 FINAL  Final  MRSA PCR Screening     Status: Abnormal   Collection Time: 05/27/17  3:44 PM  Result Value Ref Range Status   MRSA by PCR POSITIVE (A) NEGATIVE Final    Comment:        The GeneXpert MRSA Assay  (FDA  approved for NASAL specimens only), is one component of a comprehensive MRSA colonization surveillance program. It is not intended to diagnose MRSA infection nor to guide or monitor treatment for MRSA infections. RESULT CALLED TO, READ BACK BY AND VERIFIED WITH: RN Angeline SlimDELCIA F [id number] [phone]   Blood Culture (routine x 2)     Status: None (Preliminary result)   Collection Time: 05/30/17  9:00 PM  Result Value Ref Range Status   Specimen Description BLOOD RIGHT ANTECUBITAL  Final   Special Requests   Final    BOTTLES DRAWN AEROBIC AND ANAEROBIC Blood Culture adequate volume   Culture NO GROWTH < 24 HOURS  Final   Report Status PENDING  Incomplete  Blood Culture (routine x 2)     Status: None (Preliminary result)   Collection Time: 05/30/17  9:07 PM  Result Value Ref Range Status   Specimen Description BLOOD LEFT ANTECUBITAL  Final   Special Requests   Final    BOTTLES DRAWN AEROBIC AND ANAEROBIC Blood Culture adequate volume   Culture NO GROWTH < 24 HOURS  Final   Report Status PENDING  Incomplete     Scheduled Meds: . aspirin  81 mg Oral QHS  . atorvastatin  40 mg Oral Daily  . buPROPion  100 mg Oral BID  . cholecalciferol  5,000 Units Oral Daily  . enoxaparin (LOVENOX) injection  40 mg Subcutaneous Q24H  . famotidine  20 mg Oral BID  . insulin aspart  0-5 Units Subcutaneous QHS  . insulin aspart  0-9 Units Subcutaneous TID WC  . losartan  100 mg Oral Daily  . metoCLOPramide (REGLAN) injection  5 mg Intravenous TID AC  . metoprolol tartrate  12.5 mg Oral BID  . multivitamin  1 tablet Oral Daily  . sertraline  200 mg Oral QHS  . sucralfate  1 g Oral QID  . vitamin B-12  1,000 mcg Oral Daily     LOS: 2 days   Zannie CovePreetha Savahanna Almendariz, MD [facility] Office  [phone] Pager - Text Page per Amion as per below:  On-Call/Text Page:      [email or web address]      password [email or web address]  If 7PM-7AM, please contact night-coverage [email or web address] Password [email or web address] 06/01/2017, 1:33  PM

## 2017-06-01 NOTE — Progress Notes (Signed)
CM met with the patient to see what barriers he may have at home. He states he lost his CBG machine during the last visit to the hospital. Pt states he can not afford another through insurance since he paid over $100 co pay. CM provided him information on the [employer] Relion meter that cost $9 and the strips are $5 for 25. Pt feels he can afford this.  Pt states he takes his medications as prescribed and describes how he has his medication box set up. He also discusses that he does not take some of his medications including his insulin when he is having his "vomitting spells". He states these "spells" come and go but have been more frequent recently.  Pt states he lives with his parents and has support from them at home.  Pt inquired about the cost of a CPAP. He states he has been prescribed a CPAP but he monthly cost was more than he could afford. CM inquired with [facility] to see if there may be a way for him to extend payment or something to assist him in getting the DME he says he has been ordered. [facility] is following up on the request.

## 2017-06-01 NOTE — Progress Notes (Addendum)
Inpatient Diabetes Program Recommendations  AACE/ADA: New Consensus Statement on Inpatient Glycemic Control (2015)  Target Ranges:  Prepandial:   less than 140 mg/dL      Peak postprandial:   less than 180 mg/dL (1-2 hours)      Critically ill patients:  140 - 180 mg/dL   Lab Results  Component Value Date   GLUCAP 132 (H) 06/01/2017   HGBA1C 8.7 (H) 05/24/2017   Review of Glycemic Control  Diabetes history: DM2 Outpatient Diabetes medications: Tresiba 4 units QHS, Humalog 2-6 units daily around 2-3:00pm (depending on glucose), Metformin XR 500 mg BID, Glyxambi 10/5 mg QAM Current orders for Inpatient glycemic control: Novolog Sensitive 0-9 units tid + Novolog HS scale  Inpatient Diabetes Program Recommendations:    Patient admitted with DKA several times. [facility] about his DM mangement and home regimen. Patient noted to be on Glyxambi -  Empagliflozin (SGLT-2 inhibitor)/Linagliptin (GLP-1). Patients on SGLT-2 inhibitors can be more susceptible to go into DKA even DM 2. Last admission DM Coordinator made note of this on 12/28. Patient was d/c'd still on this medication. Recommend stopping this medication at d/c (Glyxambi -  Empagliflozin (SGLT-2 inhibitor)/Linagliptin (GLP-1) and have patient follow up with MD for medication adjustments.  Noted patients next appointment with Dr. Leslie DalesAltheimer is on 06/09/17.  A1c decreased from 11.2% on 10/23 to 8.7% on 12/25.  Thanks,  Christena DeemShannon Amita Atayde RN, MSN, [facility] Inpatient Diabetes Coordinator Team Pager [phone] (8a-5p)

## 2017-06-02 DIAGNOSIS — E1110 Type 2 diabetes mellitus with ketoacidosis without coma: Principal | ICD-10-CM

## 2017-06-02 DIAGNOSIS — K3184 Gastroparesis: Secondary | ICD-10-CM

## 2017-06-02 DIAGNOSIS — E1143 Type 2 diabetes mellitus with diabetic autonomic (poly)neuropathy: Secondary | ICD-10-CM

## 2017-06-02 LAB — GLUCOSE, CAPILLARY
GLUCOSE-CAPILLARY: 146 mg/dL — AB (ref 65–99)
Glucose-Capillary: 121 mg/dL — ABNORMAL HIGH (ref 65–99)

## 2017-06-02 MED ORDER — BLOOD GLUCOSE METER KIT: PACK | 0 refills | Status: DC

## 2017-06-02 MED ORDER — METOPROLOL TARTRATE 25 MG PO TABS: 25.0000 mg | ORAL_TABLET | Freq: Two times a day (BID) | ORAL | 0 refills | Status: DC

## 2017-06-02 MED ORDER — INSULIN LISPRO 100 UNIT/ML (KWIKPEN): PEN_INJECTOR | SUBCUTANEOUS | 0 refills | Status: DC

## 2017-06-02 MED ORDER — METOCLOPRAMIDE HCL 5 MG PO TABS: 5.0000 mg | ORAL_TABLET | Freq: Three times a day (TID) | ORAL | 0 refills | Status: DC

## 2017-06-02 MED ORDER — INSULIN PEN NEEDLE 31G X 5 MM MISC: 0 refills | Status: DC

## 2017-06-02 NOTE — Progress Notes (Signed)
[facility] CM provided him choice and he selected [facility]. [facility] and accepted the [facility]. Kenard Gowerrew [facility] needing to make sure the patient has obtained a new meter and that he is taking his DM medications as prescribed and checking his CBG as ordered.  Pt discharging on only 2 new medications which should be affordable for him. Pt states he still has Humalog at home.  Pt has prescription for new meter. [facility]   CM discussed the CPAP issue with the patient and him not affording it in the past. Pt states that this was about 2 years ago and in December just had another sleep study. He states they have not called him with the results. CM attempted to speak [facility] without success. CM informed Dr Alvino Chapelhoi. She states she sees in the system where it looks like the Sleep study has not been read. Pt informed to follow up with his PCP after sleep study resulted.   Pt states his mother is providing transportation home.

## 2017-06-02 NOTE — Discharge Summary (Signed)
Physician Discharge Summary  [name] [name] MHW:[id number] DOB: 1970-07-08 DOA: 05/30/2017  PCP: Shanon Rosser, PA-C  Admit date: 05/30/2017 Discharge date: 06/02/2017  Admitted From: Home Disposition:  Home  Recommendations for Outpatient Follow-up:  1. Follow up with PCP in 1 week 2. Will need CPAP ordered and assistance with affordability. Recent split sleep study completed in December 2018, not read yet or results not yet available. Follow up with PCP 3. Patient is currently on Glyxambi which is a SGLT2 inhibitor/GLP1, which will be discontinued at discharge as this is contraindicated in DKA.   4. Home health RN ordered to assist with insulin adherence  Discharge Condition: Stable CODE STATUS: Full  Diet recommendation: Carb modified   Brief/Interim Summary: [name] [name] is a 47 year old male with history of hypertension, hyperlipidemia, type 2 diabetes who presented to the hospital with nausea and vomiting for the past few days.  He was recently discharged on 12/29 for DKA.  In the emergency department his blood sugar was 246 with a venous pH 7.3, anion gap 19.  DKA was rapidly corrected with IV insulin and was resumed on sliding scale insulin.  He was also started on Reglan for trial for abdominal pain, nausea and vomiting likely component of gastroparesis.  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  Patient is currently on Glyxambi which is a SGLT2 inhibitor/GLP1, which will be discontinued at discharge as this is contraindicated in DKA.  Patient's hemoglobin A1c decreased from 11.2 in October to 8.7 in December.  Patient's blood sugars were quite well managed with sliding scale insulin alone.  Discharge Diagnoses:  Principal Problem:   DKA (diabetic ketoacidoses) ([place]) Active Problems:   Intractable nausea and vomiting   Nausea and vomiting   HTN (hypertension)   ARF (acute renal failure) ([place])   Diabetic gastroparesis [facility])  Discharge  Instructions  Discharge Instructions    Call MD for:   Complete by:  As directed    Blood sugar > 350   Call MD for:  extreme fatigue   Complete by:  As directed    Call MD for:  persistant dizziness or light-headedness   Complete by:  As directed    Call MD for:  persistant nausea and vomiting   Complete by:  As directed    Call MD for:  severe uncontrolled pain   Complete by:  As directed    Diet Carb Modified   Complete by:  As directed    Discharge instructions   Complete by:  As directed    You were cared for by a hospitalist during your hospital stay. If you have any questions about your discharge medications or the care you received while you were in the hospital after you are discharged, you can call the unit and asked to speak with the hospitalist on call if the hospitalist that took care of you is not available. Once you are discharged, your primary care physician will handle any further medical issues. Please note that NO REFILLS for any discharge medications will be authorized once you are discharged, as it is imperative that you return to your primary care physician (or establish a relationship with a primary care physician if you do not have one) for your aftercare needs so that they can reassess your need for medications and monitor your lab values.   Increase activity slowly   Complete by:  As directed      Allergies as of 06/02/2017      Reactions  Omeprazole Magnesium Swelling   Face swells, no breathing impairment   Esomeprazole Swelling   Face swells, no breathing impairment      Medication List    STOP taking these medications   glimepiride 4 MG tablet Commonly known as:  AMARYL   GLYXAMBI 10-5 MG Tabs Generic drug:  Empagliflozin-Linagliptin   Insulin Degludec 200 UNIT/ML Sopn Commonly known as:  TRESIBA FLEXTOUCH     TAKE these medications   acetaminophen 500 MG tablet Commonly known as:  TYLENOL Take 1 tablet (500 mg total) by mouth every 6 (six)  hours as needed. What changed:  reasons to take this   ALPRAZolam 1 MG tablet Commonly known as:  XANAX Take 0.5-1 mg by mouth 2 (two) times daily as needed for anxiety.   aspirin 81 MG chewable tablet Chew 81 mg by mouth at bedtime.   atorvastatin 40 MG tablet Commonly known as:  LIPITOR Take 40 mg by mouth daily.   azelastine 0.05 % ophthalmic solution Commonly known as:  OPTIVAR Place 1 drop into both eyes 2 (two) times daily.   blood glucose meter kit and supplies Dispense based on patient and insurance preference. Use up to four times daily as directed. (FOR ICD-10 E10.9, E11.9).   buPROPion 100 MG 12 hr tablet Commonly known as:  WELLBUTRIN SR Take 100 mg by mouth 2 (two) times daily.   insulin lispro 100 UNIT/ML KiwkPen Commonly known as:  HUMALOG KWIKPEN Check your blood sugar 4 times daily: before breakfast, before lunch, before dinner, before sleep.  Blood sugar 121 - 150: 1 unit BS 151 - 200: 2 units BS 201 - 250: 3 units BS 251 - 300: 5 units BS 301 - 350: 7 units BS 351 - 400: 9 units What changed:    how much to take  how to take this  when to take this  additional instructions   Insulin Pen Needle 31G X 5 MM Misc Use with humalog up to 4 times daily   losartan 100 MG tablet Commonly known as:  COZAAR Take 100 mg by mouth daily.   metFORMIN 500 MG 24 hr tablet Commonly known as:  GLUCOPHAGE-XR Take 500 mg by mouth 2 (two) times daily. What changed:  Another medication with the same name was removed. Continue taking this medication, and follow the directions you see here.   metoCLOPramide 5 MG tablet Commonly known as:  REGLAN Take 1 tablet (5 mg total) by mouth 3 (three) times daily.   metoprolol tartrate 25 MG tablet Commonly known as:  LOPRESSOR Take 1 tablet (25 mg total) by mouth 2 (two) times daily.   multivitamin-lutein Caps capsule Take 1 capsule by mouth daily.   neomycin-bacitracin-polymyxin ointment Commonly known as:   NEOSPORIN Apply 1 application topically 3 (three) times daily. To the surgical site on your back   ranitidine 150 MG tablet Commonly known as:  ZANTAC Take 150 mg by mouth at bedtime.   sertraline 100 MG tablet Commonly known as:  ZOLOFT Take 200 mg by mouth at bedtime.   sucralfate 1 g tablet Commonly known as:  CARAFATE Take 1 tablet (1 g total) by mouth 4 (four) times daily for 14 days.   vitamin B-12 1000 MCG tablet Commonly known as:  CYANOCOBALAMIN Take 1,000 mcg by mouth daily.   Vitamin D3 5000 units Tabs Take 5,000 Units by mouth daily.      Follow-up Information    Long, Nicki Reaper, [place]. Schedule an appointment as soon as possible for  a visit in 1 week(s).   Specialty:  Physician Assistant Contact information: [place] [address] [phone]          Allergies  Allergen Reactions  . Omeprazole Magnesium Swelling    Face swells, no breathing impairment  . Esomeprazole Swelling    Face swells, no breathing impairment    Consultations:  None   Procedures/Studies: Dg Chest 2 View  Result Date: 05/30/2017 CLINICAL DATA:  Nausea, vomiting, and abdominal pain since 12/24. Admitted on 12/28 for same symptoms. EXAM: CHEST  2 VIEW COMPARISON:  05/24/2017 FINDINGS: The heart size and mediastinal contours are within normal limits. Both lungs are clear. The visualized skeletal structures are unremarkable. IMPRESSION: No active cardiopulmonary disease. Electronically Signed   By: Lucienne Capers M.D.   On: 05/30/2017 21:53   Dg Chest 2 View  Result Date: 05/24/2017 CLINICAL DATA:  Body aches and emesis EXAM: CHEST  2 VIEW COMPARISON:  Chest radiograph 11/23/2015 FINDINGS: The heart size and mediastinal contours are within normal limits. Both lungs are clear. The visualized skeletal structures are unremarkable. IMPRESSION: No active cardiopulmonary disease. Electronically Signed   By: Ulyses Jarred M.D.   On: 05/24/2017 02:31   US Abdomen  Complete  Result Date: 05/24/2017 CLINICAL DATA:  Nausea, vomiting EXAM: ABDOMEN ULTRASOUND COMPLETE COMPARISON:  10/15/2016 FINDINGS: Gallbladder: No gallstones or wall thickening visualized. No sonographic Murphy sign noted by sonographer. Common bile duct: Diameter: Normal caliber, 3 mm Liver: Increased echotexture compatible with fatty infiltration. No focal abnormality or biliary ductal dilatation. Portal vein is patent on color Doppler imaging with normal direction of blood flow towards the liver. IVC: No abnormality visualized. Pancreas: Not well visualized due to overlying bowel gas. Spleen: Size and appearance within normal limits. Right Kidney: Length: 11.4 cm. Echogenicity within normal limits. No mass or hydronephrosis visualized. Left Kidney: Length: 11.7 cm. Echogenicity within normal limits. No mass or hydronephrosis visualized. Abdominal aorta: Not well visualized due to overlying bowel gas. Other findings: None. IMPRESSION: No acute findings. Electronically Signed   By: Rolm Baptise M.D.   On: 05/24/2017 08:58      Discharge Exam: Vitals:   06/02/17 0840 06/02/17 1200  BP: (!) 143/81 118/77  Pulse: 89 77  Resp:  18  Temp:  98.5 F (36.9 C)  SpO2:  99%   Vitals:   06/02/17 0425 06/02/17 0754 06/02/17 0840 06/02/17 1200  BP: (!) 146/79 127/84 (!) 143/81 118/77  Pulse: 86 91 89 77  Resp: _0 Temp: 98.4 F (36.9 C) 98.2 F (36.8 C)  98.5 F (36.9 C)  TempSrc: Oral Oral  Oral  SpO2: 100% 100%  99%  Weight:      Height:        General: Pt is alert, awake, not in acute distress Cardiovascular: RRR, S1/S2 +, no rubs, no gallops Respiratory: CTA bilaterally, no wheezing, no rhonchi Abdominal: Soft, NT, ND, bowel sounds + Extremities: no edema, no cyanosis    The results of significant diagnostics from this hospitalization (including imaging, microbiology, ancillary and laboratory) are listed below for reference.     Microbiology: Recent Results (from the  past 240 hour(s))  MRSA PCR Screening     Status: None   Collection Time: 05/24/17  6:59 AM  Result Value Ref Range Status   MRSA by PCR NEGATIVE NEGATIVE Final    Comment:        The GeneXpert MRSA Assay (FDA approved for NASAL specimens only), is  one component of a comprehensive MRSA colonization surveillance program. It is not intended to diagnose MRSA infection nor to guide or monitor treatment for MRSA infections.   Culture, blood (routine x 2)     Status: None   Collection Time: 05/24/17 10:00 AM  Result Value Ref Range Status   Specimen Description BLOOD RIGHT FOREARM  Final   Special Requests   Final    BOTTLES DRAWN AEROBIC AND ANAEROBIC Blood Culture adequate volume   Culture NO GROWTH 5 DAYS  Final   Report Status 05/29/2017 FINAL  Final  Culture, blood (routine x 2)     Status: None   Collection Time: 05/24/17 10:15 AM  Result Value Ref Range Status   Specimen Description BLOOD RIGHT HAND  Final   Special Requests   Final    BOTTLES DRAWN AEROBIC AND ANAEROBIC Blood Culture adequate volume   Culture NO GROWTH 5 DAYS  Final   Report Status 05/29/2017 FINAL  Final  MRSA PCR Screening     Status: Abnormal   Collection Time: 05/27/17  3:44 PM  Result Value Ref Range Status   MRSA by PCR POSITIVE (A) NEGATIVE Final    Comment:        The GeneXpert MRSA Assay (FDA approved for NASAL specimens only), is one component of a comprehensive MRSA colonization surveillance program. It is not intended to diagnose MRSA infection nor to guide or monitor treatment for MRSA infections. RESULT CALLED TO, READ BACK BY AND VERIFIED WITH: RN Rolena Infante [id number] MLM   Blood Culture (routine x 2)     Status: None (Preliminary result)   Collection Time: 05/30/17  9:00 PM  Result Value Ref Range Status   Specimen Description BLOOD RIGHT ANTECUBITAL  Final   Special Requests   Final    BOTTLES DRAWN AEROBIC AND ANAEROBIC Blood Culture adequate volume   Culture NO GROWTH 3 DAYS   Final   Report Status PENDING  Incomplete  Blood Culture (routine x 2)     Status: None (Preliminary result)   Collection Time: 05/30/17  9:07 PM  Result Value Ref Range Status   Specimen Description BLOOD LEFT ANTECUBITAL  Final   Special Requests   Final    BOTTLES DRAWN AEROBIC AND ANAEROBIC Blood Culture adequate volume   Culture NO GROWTH 3 DAYS  Final   Report Status PENDING  Incomplete     Labs: BNP (last 3 results) No results for input(s): BNP in the last 8760 hours. Basic Metabolic Panel: Recent Labs  Lab 05/27/17 0853  05/30/17 2241 05/30/17 2358 05/31/17 0016 05/31/17 0336 05/31/17 0654 06/01/17 0330  NA  --    < > 135 135 136 135 136 133*  K  --    < > 4.1 4.6 4.8 4.3 4.2 4.3  CL  --    < > 98* 99* 101 102 100* 104  CO2  --    < > 18* 18*  --  21* 24 20*  GLUCOSE  --    < > 211* 208* 212* 155* 127* 123*  BUN  --    < > 38* 32* 42* 28* 24* 14  CREATININE  --    < > 1.91* 1.55* 1.30* 1.38* 1.29* 1.17  CALCIUM  --    < > 8.0* 7.7*  --  8.0* 8.1* 8.2*  MG 2.0  --   --   --   --   --   --   --    < > =  values in this interval not displayed.   Liver Function Tests: Recent Labs  Lab 05/27/17 0843 05/30/17 1235  AST 18 14*  ALT 17 15*  ALKPHOS 116 128*  BILITOT 2.3* 1.8*  PROT 7.6 8.4*  ALBUMIN 3.5 4.0   Recent Labs  Lab 05/27/17 0843 05/30/17 1235  LIPASE 16 22   No results for input(s): AMMONIA in the last 168 hours. CBC: Recent Labs  Lab 05/27/17 0843 05/28/17 0347 05/30/17 1235 05/31/17 0016 06/01/17 0330  WBC 16.0* 10.7* 19.3*  --  11.3*  HGB 16.5 15.1 17.3* 16.3 14.0  HCT 51.2 44.1 50.0 48.0 42.1  MCV 99.2 91.7 91.7  --  92.9  PLT 244 237 325  --  262   Cardiac Enzymes: No results for input(s): CKTOTAL, CKMB, CKMBINDEX, TROPONINI in the last 168 hours. BNP: Invalid input(s): POCBNP CBG: Recent Labs  Lab 06/01/17 1617 06/01/17 2018 06/01/17 2212 06/02/17 0752 06/02/17 1157  GLUCAP 114* 147* 135* 121* 146*   D-Dimer No  results for input(s): DDIMER in the last 72 hours. Hgb A1c No results for input(s): HGBA1C in the last 72 hours. Lipid Profile No results for input(s): CHOL, HDL, LDLCALC, TRIG, CHOLHDL, LDLDIRECT in the last 72 hours. Thyroid function studies No results for input(s): TSH, T4TOTAL, T3FREE, THYROIDAB in the last 72 hours.  Invalid input(s): FREET3 Anemia work up No results for input(s): VITAMINB12, FOLATE, FERRITIN, TIBC, IRON, RETICCTPCT in the last 72 hours. Urinalysis    Component Value Date/Time   COLORURINE YELLOW 05/31/2017 [place] 05/31/2017 0257   LABSPEC 1.021 05/31/2017 0257   PHURINE 5.0 05/31/2017 0257   GLUCOSEU >=500 (A) 05/31/2017 0257   HGBUR SMALL (A) 05/31/2017 0257   BILIRUBINUR NEGATIVE 05/31/2017 0257   KETONESUR 80 (A) 05/31/2017 0257   PROTEINUR NEGATIVE 05/31/2017 0257   NITRITE NEGATIVE 05/31/2017 0257   LEUKOCYTESUR NEGATIVE 05/31/2017 0257   Sepsis Labs Invalid input(s): PROCALCITONIN,  WBC,  LACTICIDVEN Microbiology Recent Results (from the past 240 hour(s))  MRSA PCR Screening     Status: None   Collection Time: 05/24/17  6:59 AM  Result Value Ref Range Status   MRSA by PCR NEGATIVE NEGATIVE Final    Comment:        The GeneXpert MRSA Assay (FDA approved for NASAL specimens only), is one component of a comprehensive MRSA colonization surveillance program. It is not intended to diagnose MRSA infection nor to guide or monitor treatment for MRSA infections.   Culture, blood (routine x 2)     Status: None   Collection Time: 05/24/17 10:00 AM  Result Value Ref Range Status   Specimen Description BLOOD RIGHT FOREARM  Final   Special Requests   Final    BOTTLES DRAWN AEROBIC AND ANAEROBIC Blood Culture adequate volume   Culture NO GROWTH 5 DAYS  Final   Report Status 05/29/2017 FINAL  Final  Culture, blood (routine x 2)     Status: None   Collection Time: 05/24/17 10:15 AM  Result Value Ref Range Status   Specimen  Description BLOOD RIGHT HAND  Final   Special Requests   Final    BOTTLES DRAWN AEROBIC AND ANAEROBIC Blood Culture adequate volume   Culture NO GROWTH 5 DAYS  Final   Report Status 05/29/2017 FINAL  Final  MRSA PCR Screening     Status: Abnormal   Collection Time: 05/27/17  3:44 PM  Result Value Ref Range Status   MRSA by PCR POSITIVE (A) NEGATIVE Final  Comment:        The GeneXpert MRSA Assay (FDA approved for NASAL specimens only), is one component of a comprehensive MRSA colonization surveillance program. It is not intended to diagnose MRSA infection nor to guide or monitor treatment for MRSA infections. RESULT CALLED TO, READ BACK BY AND VERIFIED WITH: RN Rolena Infante [id number] MLM   Blood Culture (routine x 2)     Status: None (Preliminary result)   Collection Time: 05/30/17  9:00 PM  Result Value Ref Range Status   Specimen Description BLOOD RIGHT ANTECUBITAL  Final   Special Requests   Final    BOTTLES DRAWN AEROBIC AND ANAEROBIC Blood Culture adequate volume   Culture NO GROWTH 3 DAYS  Final   Report Status PENDING  Incomplete  Blood Culture (routine x 2)     Status: None (Preliminary result)   Collection Time: 05/30/17  9:07 PM  Result Value Ref Range Status   Specimen Description BLOOD LEFT ANTECUBITAL  Final   Special Requests   Final    BOTTLES DRAWN AEROBIC AND ANAEROBIC Blood Culture adequate volume   Culture NO GROWTH 3 DAYS  Final   Report Status PENDING  Incomplete     Time coordinating discharge: 40 minutes  SIGNED:  Dessa Phi, DO [facility] Pager [phone]  If 7PM-7AM, please contact night-coverage [email or web address] Password [facility] 06/02/2017, 2:38 PM

## 2017-06-02 NOTE — Progress Notes (Signed)
[name] [name] to be D/C'd Home per MD order.  Discussed with the patient and all questions fully answered.  VSS, IV catheter discontinued intact. Site without signs and symptoms of complications. Dressing and pressure applied.  An After Visit Summary was printed and given to the patient. Patient received prescription.  D/c education completed with patient/family including follow up instructions, medication list, d/c activities limitations if indicated, with other d/c instructions as indicated by MD - patient able to verbalize understanding, all questions fully answered.   Patient instructed to return to ED, call 911, or call MD for any changes in condition.   Patient is waiting on his brother to arrive to take him home. Will continue to assess.  [name] J Cathlyn Tersigni 06/02/2017 12:23 PM

## 2017-06-02 NOTE — Progress Notes (Signed)
Pt refused an escort to exit and D/C'd home with brother.

## 2017-06-02 NOTE — Discharge Instructions (Signed)
Check your blood sugar 4 times daily: before breakfast, before lunch, before dinner, before sleep. You may also check throughout the day if you feel that your blood sugar is too low or too high.   HUMALOG is your short-acting correction insulin. Check your blood sugar prior to your meal. Then depending on your blood sugar, inject insulin as follows: BG 121 - 150: 1 units  BG 151 - 200: 2 units  BG 201 - 250: 3 units  BG 251 - 300: 5 units  BG 301 - 350: 7 units  BG 351 - 400: 9 units   Keep a journal of your blood sugar results each day. Bring to your primary care physician.     Gastroparesis Gastroparesis, also called delayed gastric emptying, is a condition in which food takes longer than normal to empty from the stomach. The condition is usually long-lasting (chronic). What are the causes? This condition may be caused by:  An endocrine disorder, such as hypothyroidism or diabetes. Diabetes is the most common cause of this condition.  A nervous system disease, such as Parkinson disease or multiple sclerosis.  Cancer, infection, or surgery of the stomach or vagus nerve.  A connective tissue disorder, such as scleroderma.  Certain medicines.  In most cases, the cause is not known. What increases the risk? This condition is more likely to develop in:  People with certain disorders, including endocrine disorders, eating disorders, amyloidosis, and scleroderma.  People with certain diseases, including Parkinson disease or multiple sclerosis.  People with cancer or infection of the stomach or vagus nerve.  People who have had surgery on the stomach or vagus nerve.  People who take certain medicines.  Women.  What are the signs or symptoms? Symptoms of this condition include:  An early feeling of fullness when eating.  Nausea.  Weight loss.  Vomiting.  Heartburn.  Abdominal bloating.  Inconsistent blood glucose levels.  Lack of appetite.  Acid from the  stomach coming up into the esophagus (gastroesophageal reflux).  Spasms of the stomach.  Symptoms may come and go. How is this diagnosed? This condition is diagnosed with tests, such as:  Tests that check how long it takes food to move through the stomach and intestines. These tests include: ? Upper gastrointestinal (GI) series. In this test, X-rays of the intestines are taken after you drink a liquid. The liquid makes the intestines show up better on the X-rays. ? Gastric emptying scintigraphy. In this test, scans are taken after you eat food that contains a small amount of radioactive material. ? Wireless capsule GI monitoring system. This test involves swallowing a capsule that records information about movement through the stomach.  Gastric manometry. This test measures electrical and muscular activity in the stomach. It is done with a thin tube that is passed down the throat and into the stomach.  Endoscopy. This test checks for abnormalities in the lining of the stomach. It is done with a long, thin tube that is passed down the throat and into the stomach.  An ultrasound. This test can help rule out gallbladder disease or pancreatitis as a cause of your symptoms. It uses sound waves to take pictures of the inside of your body.  How is this treated? There is no cure for gastroparesis. This condition may be managed with:  Treatment of the underlying condition causing the gastroparesis.  Lifestyle changes, including exercise and dietary changes. Dietary changes can include: ? Changes in what and when you eat. ?  Eating smaller meals more often. ? Eating low-fat foods. ? Eating low-fiber forms of high-fiber foods, such as cooked vegetables instead of raw vegetables. ? Having liquid foods in place of solid foods. Liquid foods are easier to digest.  Medicines. These may be given to control nausea and vomiting and to stimulate stomach muscles.  Getting food through a feeding tube. This  may be done in severe cases.  A gastric neurostimulator. This is a device that is inserted into the body with surgery. It helps improve stomach emptying and control nausea and vomiting.  Follow these instructions at home:  Follow your health care provider's instructions about exercise and diet.  Take medicines only as directed by your health care provider. Contact a health care provider if:  Your symptoms do not improve with treatment.  You have new symptoms. Get help right away if:  You have severe abdominal pain that does not improve with treatment.  You have nausea that does not go away.  You cannot keep fluids down. This information is not intended to replace advice given to you by your health care provider. Make sure you discuss any questions you have with your health care provider. Document Released: 05/17/2005 Document Revised: 10/23/2015 Document Reviewed: 05/13/2014 Elsevier Interactive Patient Education  [employer].

## 2017-06-04 LAB — CULTURE, BLOOD (ROUTINE X 2)
CULTURE: NO GROWTH
CULTURE: NO GROWTH
SPECIAL REQUESTS: ADEQUATE
SPECIAL REQUESTS: ADEQUATE

## 2017-06-05 NOTE — Procedures (Signed)
[place] A. Merlene Laughter, MD     [email or web address]             NOCTURNAL POLYSOMNOGRAPHY   LOCATION: [facility]   Patient Name: [name] [name] Date: 05/20/2017 Gender: Male D.O.B: [birth year] Age (years): 46 Referring Provider: Nicki Reaper Long PA-C Height (inches): 68 Interpreting Physician: Phillips Odor MD, ABSM Weight (lbs): 182 RPSGT: Peak, Robert BMI: 28 MRN: [id number] Neck Size: 15.50 CLINICAL INFORMATION Sleep Study Type: NPSG     Indication for sleep study: OSA     Epworth Sleepiness Score: 2     SLEEP STUDY TECHNIQUE As per the AASM Manual for the Scoring of Sleep and Associated Events v2.3 (April 2016) with a hypopnea requiring 4% desaturations.  The channels recorded and monitored were frontal, central and occipital EEG, electrooculogram (EOG), submentalis EMG (chin), nasal and oral airflow, thoracic and abdominal wall motion, anterior tibialis EMG, snore microphone, electrocardiogram, and pulse oximetry.  MEDICATIONS Medications self-administered by patient taken the night of the study : N/A  Current Outpatient Medications:  .  acetaminophen (TYLENOL) 500 MG tablet, Take 1 tablet (500 mg total) by mouth every 6 (six) hours as needed. (Patient taking differently: Take 500 mg by mouth every 6 (six) hours as needed (for pain or headaches). ), Disp: 30 tablet, Rfl: 0 .  ALPRAZolam (XANAX) 1 MG tablet, Take 0.5-1 mg by mouth 2 (two) times daily as needed for anxiety. , Disp: , Rfl: 2 .  aspirin 81 MG chewable tablet, Chew 81 mg by mouth at bedtime. , Disp: , Rfl:  .  atorvastatin (LIPITOR) 40 MG tablet, Take 40 mg by mouth daily. , Disp: , Rfl:  .  azelastine (OPTIVAR) 0.05 % ophthalmic solution, Place 1 drop into both eyes 2 (two) times daily., Disp: , Rfl:  .  blood glucose meter kit and supplies, Dispense based on patient and insurance preference. Use up to four times daily as directed. (FOR ICD-10 E10.9, E11.9)., Disp: 1 each, Rfl:  0 .  buPROPion (WELLBUTRIN SR) 100 MG 12 hr tablet, Take 100 mg by mouth 2 (two) times daily. , Disp: , Rfl:  .  Cholecalciferol (VITAMIN D3) 5000 units TABS, Take 5,000 Units by mouth daily., Disp: , Rfl:  .  insulin lispro (HUMALOG KWIKPEN) 100 UNIT/ML KiwkPen, Check your blood sugar 4 times daily: before breakfast, before lunch, before dinner, before sleep.  Blood sugar 121 - 150: 1 unit BS 151 - 200: 2 units BS 201 - 250: 3 units BS 251 - 300: 5 units BS 301 - 350: 7 units BS 351 - 400: 9 units, Disp: 15 mL, Rfl: 0 .  Insulin Pen Needle 31G X 5 MM MISC, Use with humalog up to 4 times daily, Disp: 120 each, Rfl: 0 .  losartan (COZAAR) 100 MG tablet, Take 100 mg by mouth daily., Disp: , Rfl:  .  metFORMIN (GLUCOPHAGE-XR) 500 MG 24 hr tablet, Take 500 mg by mouth 2 (two) times daily., Disp: , Rfl:  .  metoCLOPramide (REGLAN) 5 MG tablet, Take 1 tablet (5 mg total) by mouth 3 (three) times daily., Disp: 90 tablet, Rfl: 0 .  metoprolol tartrate (LOPRESSOR) 25 MG tablet, Take 1 tablet (25 mg total) by mouth 2 (two) times daily., Disp: 60 tablet, Rfl: 0 .  multivitamin-lutein (OCUVITE-LUTEIN) CAPS capsule, Take 1 capsule by mouth daily., Disp: , Rfl:  .  neomycin-bacitracin-polymyxin (NEOSPORIN) ointment, Apply 1 application topically 3 (three) times daily. To the surgical site on your back, Disp: 15 g,  Rfl: 0 .  ranitidine (ZANTAC) 150 MG tablet, Take 150 mg by mouth at bedtime. , Disp: , Rfl:  .  sertraline (ZOLOFT) 100 MG tablet, Take 200 mg by mouth at bedtime. , Disp: , Rfl:  .  sucralfate (CARAFATE) 1 g tablet, Take 1 tablet (1 g total) by mouth 4 (four) times daily for 14 days., Disp: 56 tablet, Rfl: 0 .  vitamin B-12 (CYANOCOBALAMIN) 1000 MCG tablet, Take 1,000 mcg by mouth daily., Disp: , Rfl:       SLEEP ARCHITECTURE The study was initiated at 9:30:04 PM and ended at 5:01:18 AM.  Sleep onset time was 28.0 minutes and the sleep efficiency was 84.4%. The total sleep time was 380.7  minutes.  Stage REM latency was 73.5 minutes.  The patient spent 27.45% of the night in stage N1 sleep, 61.78% in stage N2 sleep, 0.00% in stage N3 and 10.77% in REM.  Alpha intrusion was absent.  Supine sleep was 52.59%.  RESPIRATORY PARAMETERS The overall apnea/hypopnea index (AHI) was 17.7 per hour. There were 12 total apneas, including 4 obstructive, 7 central and 1 mixed apneas. There were 100 hypopneas and 44 RERAs.  The AHI during Stage REM sleep was 17.6 per hour.  AHI while supine was 31.2 per hour.  The mean oxygen saturation was 92.17%. The minimum SpO2 during sleep was 80.00%.  moderate snoring was noted during this study.  CARDIAC DATA The 2 lead EKG demonstrated sinus rhythm. The mean heart rate was 75.51 beats per minute. Other EKG findings include: None. LEG MOVEMENT DATA The total PLMS were 19 with a resulting PLMS index of 2.99. Associated arousal with leg movement index was 0.2.  IMPRESSIONS 1. Moderate obstructive sleep apnea is documented during this study. A trial of  Auto Pap 7-12  is recommended.  2. Absent slow wave sleep is observed.  Delano Metz, MD Diplomate, American Board of Sleep Medicine. ELECTRONICALLY SIGNED ON:  06/05/2017, 9:19 PM [place] PH: [phone]   FX: [phone] [place]

## 2017-06-09 ENCOUNTER — Encounter (HOSPITAL_COMMUNITY): Payer: Self-pay

## 2017-06-09 ENCOUNTER — Emergency Department (HOSPITAL_COMMUNITY)
Admission: EM | Admit: 2017-06-09 | Discharge: 2017-06-09 | Disposition: A | Discharge status: Home / Self Care | Payer: 59 | Attending: Emergency Medicine | Admitting: Emergency Medicine

## 2017-06-09 ENCOUNTER — Emergency Department (HOSPITAL_COMMUNITY): Payer: 59

## 2017-06-09 DIAGNOSIS — I10 Essential (primary) hypertension: Secondary | ICD-10-CM | POA: Insufficient documentation

## 2017-06-09 DIAGNOSIS — Z794 Long term (current) use of insulin: Secondary | ICD-10-CM | POA: Insufficient documentation

## 2017-06-09 DIAGNOSIS — E119 Type 2 diabetes mellitus without complications: Secondary | ICD-10-CM | POA: Insufficient documentation

## 2017-06-09 DIAGNOSIS — F17210 Nicotine dependence, cigarettes, uncomplicated: Secondary | ICD-10-CM | POA: Insufficient documentation

## 2017-06-09 DIAGNOSIS — Z79899 Other long term (current) drug therapy: Secondary | ICD-10-CM | POA: Insufficient documentation

## 2017-06-09 DIAGNOSIS — R112 Nausea with vomiting, unspecified: Secondary | ICD-10-CM | POA: Diagnosis not present

## 2017-06-09 DIAGNOSIS — Z7982 Long term (current) use of aspirin: Secondary | ICD-10-CM | POA: Insufficient documentation

## 2017-06-09 DIAGNOSIS — R1110 Vomiting, unspecified: Secondary | ICD-10-CM | POA: Diagnosis present

## 2017-06-09 LAB — COMPREHENSIVE METABOLIC PANEL
ALBUMIN: 3.6 g/dL (ref 3.5–5.0)
ALT: 13 U/L — ABNORMAL LOW (ref 17–63)
AST: 12 U/L — ABNORMAL LOW (ref 15–41)
Alkaline Phosphatase: 102 U/L (ref 38–126)
BILIRUBIN TOTAL: 0.8 mg/dL (ref 0.3–1.2)
BUN: 25 mg/dL — ABNORMAL HIGH (ref 6–20)
CO2: 22 mmol/L (ref 22–32)
Calcium: 9 mg/dL (ref 8.9–10.3)
Chloride: 97 mmol/L — ABNORMAL LOW (ref 101–111)
Creatinine, Ser: 1.33 mg/dL — ABNORMAL HIGH (ref 0.61–1.24)
GFR calc Af Amer: >60 mL/min (ref >60)
GFR calc non Af Amer: >60 mL/min (ref >60)
GLUCOSE: 256 mg/dL — AB (ref 65–99)
POTASSIUM: 4.5 mmol/L (ref 3.5–5.1)
SODIUM: 140 mmol/L (ref 135–145)
TOTAL PROTEIN: 7.3 g/dL (ref 6.5–8.1)

## 2017-06-09 LAB — CBC WITH DIFFERENTIAL/PLATELET
BASOS ABS: 0 10*3/uL (ref 0.0–0.1)
BASOS PCT: 0 %
Eosinophils Absolute: 0 10*3/uL (ref 0.0–0.7)
Eosinophils Relative: 0 %
HEMATOCRIT: 48.6 % (ref 39.0–52.0)
HEMOGLOBIN: 15.9 g/dL (ref 13.0–17.0)
Lymphocytes Relative: 14 %
Lymphs Abs: 2.5 10*3/uL (ref 0.7–4.0)
MCH: 30.3 pg (ref 26.0–34.0)
MCHC: 32.7 g/dL (ref 30.0–36.0)
MCV: 92.6 fL (ref 78.0–100.0)
MONOS PCT: 5 %
Monocytes Absolute: 1 10*3/uL (ref 0.1–1.0)
NEUTROS ABS: 14.5 10*3/uL — AB (ref 1.7–7.7)
NEUTROS PCT: 81 %
Platelets: 296 10*3/uL (ref 150–400)
RBC: 5.25 MIL/uL (ref 4.22–5.81)
RDW: 12.4 % (ref 11.5–15.5)
WBC: 18 10*3/uL — ABNORMAL HIGH (ref 4.0–10.5)

## 2017-06-09 LAB — CBG MONITORING, ED: GLUCOSE-CAPILLARY: 197 mg/dL — AB (ref 65–99)

## 2017-06-09 LAB — BASIC METABOLIC PANEL
ANION GAP: 13 (ref 5–15)
BUN: 23 mg/dL — ABNORMAL HIGH (ref 6–20)
CO2: 23 mmol/L (ref 22–32)
Calcium: 8.5 mg/dL — ABNORMAL LOW (ref 8.9–10.3)
Chloride: 103 mmol/L (ref 101–111)
Creatinine, Ser: 1.1 mg/dL (ref 0.61–1.24)
GFR calc Af Amer: >60 mL/min (ref >60)
Glucose, Bld: 208 mg/dL — ABNORMAL HIGH (ref 65–99)
POTASSIUM: 4.1 mmol/L (ref 3.5–5.1)
SODIUM: 139 mmol/L (ref 135–145)

## 2017-06-09 LAB — LIPASE, BLOOD: LIPASE: 20 U/L (ref 11–51)

## 2017-06-09 LAB — TROPONIN I: Troponin I: <0.03 ng/mL (ref <0.03)

## 2017-06-09 MED ORDER — SUCRALFATE 1 G PO TABS: 1.0000 g | ORAL_TABLET | Freq: Three times a day (TID) | ORAL | 0 refills | Status: DC

## 2017-06-09 MED ORDER — SODIUM CHLORIDE 0.9 % IV BOLUS (SEPSIS)
1000.0000 mL | Freq: Once | INTRAVENOUS | Status: AC
  Administered 2017-06-09: 1000 mL via INTRAVENOUS

## 2017-06-09 MED ORDER — METOPROLOL TARTRATE 25 MG PO TABS
25.0000 mg | ORAL_TABLET | Freq: Once | ORAL | Status: AC
  Administered 2017-06-09: 25 mg via ORAL
  Filled 2017-06-09: qty 1

## 2017-06-09 MED ORDER — METOCLOPRAMIDE HCL 5 MG/ML IJ SOLN
10.0000 mg | Freq: Once | INTRAMUSCULAR | Status: AC
  Administered 2017-06-09: 10 mg via INTRAVENOUS
  Filled 2017-06-09: qty 2

## 2017-06-09 MED ORDER — METOCLOPRAMIDE HCL 10 MG PO TABS: 10.0000 mg | ORAL_TABLET | Freq: Four times a day (QID) | ORAL | 0 refills | Status: DC | PRN

## 2017-06-09 MED ORDER — INSULIN ASPART 100 UNIT/ML ~~LOC~~ SOLN
8.0000 [IU] | Freq: Once | SUBCUTANEOUS | Status: AC
  Administered 2017-06-09: 8 [IU] via SUBCUTANEOUS
  Filled 2017-06-09: qty 1

## 2017-06-09 NOTE — ED Notes (Signed)
Patient drinking water, tolerated well.

## 2017-06-09 NOTE — ED Notes (Signed)
ED Provider at bedside. 

## 2017-06-09 NOTE — ED Triage Notes (Signed)
Ems reports pt c/o n/v and generalized weakness x 2 weeks.  Reports cbg 283.  EMS administered 4mg  zofran iv pta.  Pt says nausea has improved.  Pt was recently discharged from the hospital from DKA.

## 2017-06-09 NOTE — ED Notes (Signed)
CRITICAL VALUE ALERT  Critical Value:  Venous Blood Gas PO2 - below reported range  Date & Time Notied:  06/09/17 1525  Provider Notified: Dr Jacqulyn BathLong  Orders Received/Actions taken:

## 2017-06-09 NOTE — ED Provider Notes (Signed)
Emergency Department Provider Note   I have reviewed the triage vital signs and the nursing notes.   HISTORY  Chief Complaint Emesis   HPI [name] [name] is a 47 y.o. male with PMH of IDDM, multiple admission for DKA, chronic vomiting concerning for gastroparesis, HTN, and HLD presents to the emergency department with vomiting and diarrhea.  The patient was discharged from the hospital after admission for intractable vomiting and DKA.  He states that his symptoms have continued since that discharge.  He saw his primary care physician yesterday and was prescribed antiemetic but was unable to get it filled.  But today he had worsening vomiting with some associated nonbloody diarrhea.  He describes discomfort in his chest and abdomen.  He dates it feels like burning and tightness in both areas.  No fevers or chills.  States his blood sugars have been running in the 200s at home.  He reports being compliant with his diabetes medications.   Past Medical History:  Diagnosis Date  . Atypical chest pain   . Diabetes mellitus without complication (HCC)   . Hyperlipemia   . Hypertension   . Malaise and fatigue     Patient Active Problem List   Diagnosis Date Noted  . Diabetic gastroparesis (HCC) 06/02/2017  . ARF (acute renal failure) (HCC) 05/30/2017  . DKA (diabetic ketoacidoses) (HCC) 10/15/2016  . Gastroesophageal reflux disease 10/15/2016  . Anxiety with depression 10/15/2016  . HTN (hypertension) 10/15/2016  . Intractable nausea and vomiting 11/25/2015  . Dental abscess 11/25/2015  . Nausea and vomiting 11/25/2015  . Angina pectoris (HCC) 03/27/2015    Past Surgical History:  Procedure Laterality Date  . ANKLE SURGERY  1982  . APPENDECTOMY  1975  . CARDIAC CATHETERIZATION N/A 03/28/2015   Procedure: Left Heart Cath and Coronary Angiography;  Surgeon: Yates Decamp, MD;  Location: [facility] INVASIVE CV LAB;  Service: Cardiovascular;  Laterality: N/A;  . CARDIAC CATHETERIZATION N/A  03/28/2015   Procedure: Intravascular Pressure Wire/FFR Study;  Surgeon: Yates Decamp, MD;  Location: [facility] INVASIVE CV LAB;  Service: Cardiovascular;  Laterality: N/A;  . CARPAL TUNNEL RELEASE Left   . ELBOW FRACTURE SURGERY Left   . PILONIDAL CYST EXCISION N/A 03/24/2017   Procedure: EXCISION CHRONIC  PILONIDAL ABSCESS;  Surgeon: [name] Miyamoto, MD;  Location: [facility] ORS;  Service: General;  Laterality: N/A;    Current Outpatient Rx  . Order #: [id number] Class: Normal  . Order #: [id number] Class: Historical Med  . Order #: [id number] Class: Historical Med  . Order #: [id number] Class: Historical Med  . Order #: [id number] Class: Historical Med  . Order #: [id number] Class: Print  . Order #: [id number] Class: Historical Med  . Order #: [id number] Class: Historical Med  . Order #: [id number] Class: Historical Med  . Order #: [id number] Class: Historical Med  . Order #: [id number] Class: Print  . Order #: [id number] Class: Print  . Order #: [id number] Class: Historical Med  . Order #: [id number] Class: Historical Med  . Order #: [id number] Class: Print  . Order #: [id number] Class: Print  . Order #: [id number] Class: Historical Med  . Order #: [id number] Class: Print  . Order #: [id number] Class: Historical Med  . Order #: [id number] Class: Historical Med  . Order #: [id number] Class: Print  . Order #: [id number] Class: Historical Med  . Order #: [id number] Class: Historical Med    Allergies Omeprazole magnesium and Esomeprazole  Family History  Problem Relation Age of Onset  . Congestive Heart Failure Sister   . Diabetes Mother   . Hypertension Mother   . Diabetes  Father   . Hypertension Father     Social History Social History   Tobacco Use  . Smoking status: Current Every Day Smoker    Packs/day: 1.00    Years: 29.00    Pack years: 29.00    Types: Cigarettes  . Smokeless tobacco: Never Used  Substance Use Topics  . Alcohol use: No    Alcohol/week: 0.0 oz  . Drug use: No    Review of  Systems  Constitutional: No fever/chills Eyes: No visual changes. ENT: No sore throat. Cardiovascular: Positive chest pain. Respiratory: Denies shortness of breath. Gastrointestinal: Positive abdominal pain. Positive nausea, vomiting, and diarrhea.  No constipation. Genitourinary: Negative for dysuria. Musculoskeletal: Negative for back pain. Skin: Negative for rash. Neurological: Negative for headaches, focal weakness or numbness.  10-point ROS otherwise negative.  ____________________________________________   PHYSICAL EXAM:  VITAL SIGNS: ED Triage Vitals  Enc Vitals Group     BP 06/09/17 1404 (!) 156/111     Pulse Rate 06/09/17 1404 (!) 118     Resp 06/09/17 1404 18     Temp 06/09/17 1404 98.9 F (37.2 C)     Temp Source 06/09/17 1404 Oral     SpO2 06/09/17 1404 99 %     Weight 06/09/17 1403 156 lb (70.8 kg)     Height 06/09/17 1403 5\' 8"  (1.727 m)     Pain Score 06/09/17 1401 8   Constitutional: Alert and oriented. Well appearing and in no acute distress. Eyes: Conjunctivae are normal. Head: Atraumatic. Nose: No congestion/rhinnorhea. Mouth/Throat: Mucous membranes are dry.  Neck: No stridor.   Cardiovascular: Tachycardia. Good peripheral circulation. Grossly normal heart sounds.   Respiratory: Normal respiratory effort.  No retractions. Lungs CTAB. Gastrointestinal: Soft and nontender. No distention.  Musculoskeletal: No lower extremity tenderness nor edema. No gross deformities of extremities. Neurologic:  Normal speech and language. No gross focal neurologic deficits are appreciated.  Skin:  Skin is warm, dry and intact. No rash noted.  ____________________________________________   LABS (all labs ordered are listed, but only abnormal results are displayed)  Labs Reviewed  COMPREHENSIVE METABOLIC PANEL - Abnormal; Notable for the following components:      Result Value   Chloride 97 (*)    Glucose, Bld 256 (*)    BUN 25 (*)    Creatinine, Ser 1.33 (*)     AST 12 (*)    ALT 13 (*)    Anion gap >20 (*)    All other components within normal limits  CBC WITH DIFFERENTIAL/PLATELET - Abnormal; Notable for the following components:   WBC 18.0 (*)    Neutro Abs 14.5 (*)    All other components within normal limits  BLOOD GAS, VENOUS - Abnormal; Notable for the following components:   pCO2, Ven 42.7 (*)    All other components within normal limits  BASIC METABOLIC PANEL - Abnormal; Notable for the following components:   Glucose, Bld 208 (*)    BUN 23 (*)    Calcium 8.5 (*)    All other components within normal limits  CBG MONITORING, ED - Abnormal; Notable for the following components:   Glucose-Capillary 197 (*)    All other components within normal limits  LIPASE, BLOOD  TROPONIN I  CBG MONITORING, ED  CBG MONITORING, ED  CBG MONITORING, ED   ____________________________________________  EKG   EKG Interpretation  Date/Time:  Thursday June 09 2017 14:17:07 EST Ventricular Rate:  120 PR Interval:    QRS Duration: 128 QT  Interval:  351 QTC Calculation: 496 R Axis:   -106 Text Interpretation:  Sinus tachycardia RBBB and LAFB Baseline wander in lead(s) II III aVF No STEMI. Similar to prior.  Confirmed by Alona Bene [phone]) on 06/09/2017 3:06:14 PM       ____________________________________________  RADIOLOGY  Dg Abdomen Acute W/chest  Result Date: 06/09/2017 CLINICAL DATA:  Abdominal pain, burning in chest with vomiting. Weakness. EXAM: DG ABDOMEN ACUTE W/ 1V CHEST COMPARISON:  Chest x-ray 05/30/2017 and KUB 11/18/2015 FINDINGS: Lungs are clear.  Remainder of the chest is unchanged. Abdominopelvic images demonstrate no free peritoneal air. Bowel gas pattern demonstrates several air-filled loops of large and small bowel without evidence of obstruction. No air-fluid levels. Minimal degenerate change of the spine. IMPRESSION: Negative abdominal radiographs.  No acute cardiopulmonary disease. Electronically Signed   By: Elberta Fortis M.D.   On: 06/09/2017 15:03    ____________________________________________   PROCEDURES  Procedure(s) performed:   Procedures  None ____________________________________________   INITIAL IMPRESSION / ASSESSMENT AND PLAN / ED COURSE  Pertinent labs & imaging results that were available during my care of the patient were reviewed by me and considered in my medical decision making (see chart for details).  Patient presents to the emergency department with continued vomiting and diarrhea.  He had recent admission for DKA and was discharged on January 3rd of this year.  During that admission his anion gap closed quickly.  Patient also with history of gastroparesis.  I suspect that his chest and abdominal discomfort are secondary to GI etiology rather than ACS or vascular issue.  Plan for plain films of the chest abdomen and pelvis.  Will also order labs including VBG.  Patient received Zofran in route with EMS.  Plan to add Reglan with history of gastroparesis.   06:42 PM Patient gap now closed on repeat BMP. Patient never had acidosis. No vomiting in the ED. Some tachycardia but improved after giving patient's PO Metoprolol which he missed taking this AM due to vomiting. He is tolerating his meds and fluids here in the ED. He reprots feeling much better. Plan for discharge home at this time and patient feels comfortable with that plan. Will return with any new or worsening symptoms.   At this time, I do not feel there is any life-threatening condition present. I have reviewed and discussed all results (EKG, imaging, lab, urine as appropriate), exam findings with patient. I have reviewed nursing notes and appropriate previous records.  I feel the patient is safe to be discharged home without further emergent workup. Discussed usual and customary return precautions. Patient and family (if present) verbalize understanding and are comfortable with this plan.  Patient will follow-up with  their primary care provider. If they do not have a primary care provider, information for follow-up has been provided to them. All questions have been answered.  ____________________________________________  FINAL CLINICAL IMPRESSION(S) / ED DIAGNOSES  Final diagnoses:  Non-intractable vomiting with nausea, unspecified vomiting type     MEDICATIONS GIVEN DURING THIS VISIT:  Medications  sodium chloride 0.9 % bolus 1,000 mL (0 mLs Intravenous Stopped 06/09/17 1807)  metoCLOPramide (REGLAN) injection 10 mg (10 mg Intravenous Given 06/09/17 1456)  insulin aspart (novoLOG) injection 8 Units (8 Units Subcutaneous Given 06/09/17 1553)  sodium chloride 0.9 % bolus 1,000 mL (0 mLs Intravenous Stopped 06/09/17 1807)  metoprolol tartrate (LOPRESSOR) tablet 25 mg (25 mg Oral Given 06/09/17 1815)     NEW OUTPATIENT MEDICATIONS STARTED DURING THIS VISIT:  New Prescriptions   METOCLOPRAMIDE (REGLAN) 10 MG TABLET    Take 1 tablet (10 mg total) by mouth every 6 (six) hours as needed for nausea.   SUCRALFATE (CARAFATE) 1 G TABLET    Take 1 tablet (1 g total) by mouth 4 (four) times daily -  with meals and at bedtime for 14 days.    Note:  This document was prepared using Dragon voice recognition software and may include unintentional dictation errors.  Alona BeneJoshua Long, MD Emergency Medicine    Long, Arlyss RepressJoshua G, MD 06/09/17 [phone]

## 2017-06-09 NOTE — Discharge Instructions (Signed)
You have been seen in the Emergency Department (ED) today for nausea and vomiting.  Your work up today has not shown a clear cause for your symptoms. °You have been prescribed Reglan; please use as prescribed as needed for your nausea. ° °Follow up with your doctor as soon as possible regarding today?s emergent visit and your symptoms of nausea.  ° °Return to the ED if you develop abdominal, bloody vomiting, bloody diarrhea, if you are unable to tolerate fluids due to vomiting, or if you develop other symptoms that concern you. ° °

## 2017-06-10 LAB — BLOOD GAS, VENOUS
Acid-Base Excess: 1.2 mmol/L (ref 0.0–2.0)
Bicarbonate: 23.9 mmol/L (ref 20.0–28.0)
FIO2: 0.21
O2 Saturation: 50.7 %
PH VEN: 7.394 (ref 7.250–7.430)
Patient temperature: 37
pCO2, Ven: 42.7 mmHg — ABNORMAL LOW (ref 44.0–60.0)

## 2017-06-13 ENCOUNTER — Emergency Department (HOSPITAL_COMMUNITY): Payer: 59

## 2017-06-13 ENCOUNTER — Emergency Department (HOSPITAL_COMMUNITY)
Admission: EM | Admit: 2017-06-13 | Discharge: 2017-06-13 | Disposition: A | Discharge status: Home / Self Care | Payer: 59 | Attending: Emergency Medicine | Admitting: Emergency Medicine

## 2017-06-13 ENCOUNTER — Encounter (HOSPITAL_COMMUNITY): Payer: Self-pay

## 2017-06-13 DIAGNOSIS — E119 Type 2 diabetes mellitus without complications: Secondary | ICD-10-CM | POA: Diagnosis not present

## 2017-06-13 DIAGNOSIS — K3184 Gastroparesis: Secondary | ICD-10-CM | POA: Diagnosis not present

## 2017-06-13 DIAGNOSIS — R112 Nausea with vomiting, unspecified: Secondary | ICD-10-CM | POA: Diagnosis present

## 2017-06-13 DIAGNOSIS — I10 Essential (primary) hypertension: Secondary | ICD-10-CM | POA: Insufficient documentation

## 2017-06-13 DIAGNOSIS — F17210 Nicotine dependence, cigarettes, uncomplicated: Secondary | ICD-10-CM | POA: Insufficient documentation

## 2017-06-13 DIAGNOSIS — Z79899 Other long term (current) drug therapy: Secondary | ICD-10-CM | POA: Insufficient documentation

## 2017-06-13 DIAGNOSIS — Z7982 Long term (current) use of aspirin: Secondary | ICD-10-CM | POA: Insufficient documentation

## 2017-06-13 DIAGNOSIS — Z794 Long term (current) use of insulin: Secondary | ICD-10-CM | POA: Diagnosis not present

## 2017-06-13 LAB — BLOOD GAS, VENOUS
Acid-base deficit: 2.1 mmol/L — ABNORMAL HIGH (ref 0.0–2.0)
BICARBONATE: 21 mmol/L (ref 20.0–28.0)
O2 Saturation: 64.7 %
PATIENT TEMPERATURE: 37
PH VEN: 7.325 (ref 7.250–7.430)
pCO2, Ven: 45.4 mmHg (ref 44.0–60.0)
pO2, Ven: 37.3 mmHg (ref 32.0–45.0)

## 2017-06-13 LAB — CBC WITH DIFFERENTIAL/PLATELET
BASOS ABS: 0 10*3/uL (ref 0.0–0.1)
Basophils Relative: 0 %
EOS PCT: 0 %
Eosinophils Absolute: 0.1 10*3/uL (ref 0.0–0.7)
HEMATOCRIT: 46.2 % (ref 39.0–52.0)
Hemoglobin: 15.4 g/dL (ref 13.0–17.0)
LYMPHS ABS: 1.7 10*3/uL (ref 0.7–4.0)
LYMPHS PCT: 11 %
MCH: 30.1 pg (ref 26.0–34.0)
MCHC: 33.3 g/dL (ref 30.0–36.0)
MCV: 90.4 fL (ref 78.0–100.0)
Monocytes Absolute: 0.5 10*3/uL (ref 0.1–1.0)
Monocytes Relative: 3 %
NEUTROS ABS: 13.1 10*3/uL — AB (ref 1.7–7.7)
Neutrophils Relative %: 86 %
Platelets: 230 10*3/uL (ref 150–400)
RBC: 5.11 MIL/uL (ref 4.22–5.81)
RDW: 11.9 % (ref 11.5–15.5)
WBC: 15.4 10*3/uL — ABNORMAL HIGH (ref 4.0–10.5)

## 2017-06-13 LAB — BASIC METABOLIC PANEL
Anion gap: 18 — ABNORMAL HIGH (ref 5–15)
BUN: 24 mg/dL — ABNORMAL HIGH (ref 6–20)
CALCIUM: 8.5 mg/dL — AB (ref 8.9–10.3)
CO2: 22 mmol/L (ref 22–32)
Chloride: 96 mmol/L — ABNORMAL LOW (ref 101–111)
Creatinine, Ser: 1.26 mg/dL — ABNORMAL HIGH (ref 0.61–1.24)
GFR calc Af Amer: >60 mL/min (ref >60)
GLUCOSE: 208 mg/dL — AB (ref 65–99)
POTASSIUM: 4.4 mmol/L (ref 3.5–5.1)
SODIUM: 136 mmol/L (ref 135–145)

## 2017-06-13 MED ORDER — METOCLOPRAMIDE HCL 5 MG/ML IJ SOLN
10.0000 mg | Freq: Once | INTRAMUSCULAR | Status: AC
  Administered 2017-06-13: 10 mg via INTRAVENOUS
  Filled 2017-06-13: qty 2

## 2017-06-13 MED ORDER — PROMETHAZINE HCL 25 MG/ML IJ SOLN
12.5000 mg | Freq: Once | INTRAMUSCULAR | Status: AC
  Administered 2017-06-13: 12.5 mg via INTRAVENOUS
  Filled 2017-06-13: qty 1

## 2017-06-13 MED ORDER — METOCLOPRAMIDE HCL 10 MG PO TABS: 10.0000 mg | ORAL_TABLET | Freq: Four times a day (QID) | ORAL | 0 refills | Status: DC | PRN

## 2017-06-13 MED ORDER — SODIUM CHLORIDE 0.9 % IV SOLN
INTRAVENOUS | Status: DC
  Administered 2017-06-13: 14:00:00 via INTRAVENOUS

## 2017-06-13 MED ORDER — SODIUM CHLORIDE 0.9 % IV BOLUS (SEPSIS)
1000.0000 mL | Freq: Once | INTRAVENOUS | Status: AC
  Administered 2017-06-13: 1000 mL via INTRAVENOUS

## 2017-06-13 NOTE — ED Provider Notes (Signed)
[facility] EMERGENCY DEPARTMENT Provider Note   CSN: [id number] Arrival date & time: 06/13/17  1253     History   Chief Complaint Chief Complaint  Patient presents with  . Emesis    HPI [name] [name] is a 47 y.o. male.  HPI Patient presents to the emergency room with complaints of nausea and vomiting.  Patient has a history of recurrent episodes of nausea and vomiting attributed to diabetic gastroparesis.  Patient states he had intermittent episodes like this for the last couple of years.  He is also been admitted to the hospital in the past for diabetic ketoacidosis and a high anion gap metabolic acidosis.  Patient was most recently admitted to the hospital on January 3.  Patient had return to the emergency room on January 10 and he was discharged.  Patient returns today because he feels like he is having these persistent symptoms.  Patient states he has been having trouble with nausea and vomiting and not being able to keep anything down since December.  His symptoms continue to wax and wane.  They became more severe over the last few days again.  He has not been able to keep anything down.  He continues to have nausea and vomiting.  He denies any significant pain.  No diarrhea.  No fevers or chills.  His blood sugars have not been running high.  Patient states his home health nurse asked about him coming to ED to get an endoscopy.  Patient states he does have a primary care doctor and a GI doctor.  He has not had an endoscopy for couple of years.  He has not seen the GI doctor recently since the symptoms started. Past Medical History:  Diagnosis Date  . Atypical chest pain   . Diabetes mellitus without complication ([place])   . Hyperlipemia   . Hypertension   . Malaise and fatigue     Patient Active Problem List   Diagnosis Date Noted  . Diabetic gastroparesis ([place]) 06/02/2017  . ARF (acute renal failure) ([place]) 05/30/2017  . DKA (diabetic ketoacidoses) ([place]) 10/15/2016  .  Gastroesophageal reflux disease 10/15/2016  . Anxiety with depression 10/15/2016  . HTN (hypertension) 10/15/2016  . Intractable nausea and vomiting 11/25/2015  . Dental abscess 11/25/2015  . Nausea and vomiting 11/25/2015  . Angina pectoris ([place]) 03/27/2015    Past Surgical History:  Procedure Laterality Date  . [place]  . APPENDECTOMY  1975  . CARDIAC CATHETERIZATION N/A 03/28/2015   Procedure: Left Heart Cath and Coronary Angiography;  Surgeon: Adrian Prows, MD;  Location: [place] [facility];  Service: Cardiovascular;  Laterality: N/A;  . CARDIAC CATHETERIZATION N/A 03/28/2015   Procedure: Intravascular Pressure Wire/FFR Study;  Surgeon: Adrian Prows, MD;  Location: [place] [facility];  Service: Cardiovascular;  Laterality: N/A;  . CARPAL TUNNEL RELEASE Left   . ELBOW FRACTURE SURGERY Left   . PILONIDAL CYST EXCISION N/A 03/24/2017   Procedure: EXCISION CHRONIC  PILONIDAL ABSCESS;  Surgeon: Coralie Keens, MD;  Location: [facility];  Service: General;  Laterality: N/A;       Home Medications    Prior to Admission medications   Medication Sig Start Date End Date Taking? Authorizing Provider  acetaminophen (TYLENOL) 500 MG tablet Take 1 tablet (500 mg total) by mouth every 6 (six) hours as needed. Patient taking differently: Take 500 mg by mouth every 6 (six) hours as needed (for pain or headaches).  05/28/17   Florencia Reasons, MD  ALPRAZolam (XANAX) 1 MG tablet Take 0.5-1 mg by mouth 2 (two) times daily as needed for anxiety.  05/12/17   [provider]  aspirin 81 MG chewable tablet Chew 81 mg by mouth at bedtime.     [provider]  atorvastatin (LIPITOR) 40 MG tablet Take 40 mg by mouth daily.  07/29/14   [provider]  azelastine (OPTIVAR) 0.05 % ophthalmic solution Place 1 drop into both eyes 2 (two) times daily.    [provider]  blood glucose meter kit and supplies Dispense based on patient and insurance preference. Use up to four  times daily as directed. (FOR ICD-10 E10.9, E11.9). 06/02/17   Dessa Phi, DO  buPROPion (WELLBUTRIN SR) 100 MG 12 hr tablet Take 100 mg by mouth 2 (two) times daily.     [provider]  Cholecalciferol (VITAMIN D3) 5000 units TABS Take 5,000 Units by mouth daily.    [provider]  glimepiride (AMARYL) 4 MG tablet Take 1 tablet by mouth daily. 05/08/17   [provider]  GLYXAMBI 10-5 MG TABS Take 1 tablet by mouth daily. 04/28/17   [provider]  insulin lispro (HUMALOG KWIKPEN) 100 UNIT/ML KiwkPen Check your blood sugar 4 times daily: before breakfast, before lunch, before dinner, before sleep.  Blood sugar 121 - 150: 1 unit BS 151 - 200: 2 units BS 201 - 250: 3 units BS 251 - 300: 5 units BS 301 - 350: 7 units BS 351 - 400: 9 units 06/02/17   Dessa Phi, DO  Insulin Pen Needle 31G X 5 MM MISC Use with humalog up to 4 times daily 06/02/17   Dessa Phi, DO  losartan (COZAAR) 100 MG tablet Take 100 mg by mouth daily.    [provider]  metFORMIN (GLUCOPHAGE-XR) 500 MG 24 hr tablet Take 500 mg by mouth 2 (two) times daily.    [provider]  metoCLOPramide (REGLAN) 10 MG tablet Take 1 tablet (10 mg total) by mouth every 6 (six) hours as needed for nausea. 06/13/17   Dorie Rank, MD  metoprolol tartrate (LOPRESSOR) 25 MG tablet Take 1 tablet (25 mg total) by mouth 2 (two) times daily. 06/02/17   Dessa Phi, DO  multivitamin-lutein [facility]) CAPS capsule Take 1 capsule by mouth daily.    [provider]  neomycin-bacitracin-polymyxin (NEOSPORIN) ointment Apply 1 application topically 3 (three) times daily. To the surgical site on your back 05/25/17   Bonnielee Haff, MD  ranitidine (ZANTAC) 150 MG tablet Take 150 mg by mouth at bedtime.     [provider]  sertraline (ZOLOFT) 100 MG tablet Take 200 mg by mouth at bedtime.     [provider]  sucralfate (CARAFATE) 1 g tablet Take 1 tablet (1 g  total) by mouth 4 (four) times daily -  with meals and at bedtime for 14 days. 06/09/17 06/23/17  Long, Wonda Olds, MD  TRESIBA FLEXTOUCH 200 UNIT/ML SOPN Inject 15 Units into the skin at bedtime. 05/11/17   [provider]  vitamin B-12 (CYANOCOBALAMIN) 1000 MCG tablet Take 1,000 mcg by mouth daily.    [provider]    Family History Family History  Problem Relation Age of Onset  . Congestive Heart Failure Sister   . Diabetes Mother   . Hypertension Mother   . Diabetes Father   . Hypertension Father     Social History Social History   Tobacco Use  . Smoking status: Current Every Day Smoker  Packs/day: 1.00    Years: 29.00    Pack years: 29.00    Types: Cigarettes  . Smokeless tobacco: Never Used  Substance Use Topics  . Alcohol use: No    Alcohol/week: 0.0 oz  . Drug use: No     Allergies   Omeprazole magnesium and Esomeprazole   Review of Systems Review of Systems  All other systems reviewed and are negative.    Physical Exam Updated Vital Signs BP (!) 152/103 (BP Location: Right Arm)   Pulse 98   Temp 98.3 F (36.8 C) (Oral)   Resp 18   Ht 1.727 m (_0 )   Wt 70.3 kg (155 lb)   SpO2 99%   BMI 23.57 kg/m   Physical Exam  Constitutional: He appears well-developed and well-nourished. No distress.  HENT:  Head: Normocephalic and atraumatic.  Right Ear: External ear normal.  Left Ear: External ear normal.  Eyes: Conjunctivae are normal. Right eye exhibits no discharge. Left eye exhibits no discharge. No scleral icterus.  Neck: Neck supple. No tracheal deviation present.  Cardiovascular: Normal rate, regular rhythm and intact distal pulses.  Pulmonary/Chest: Effort normal and breath sounds normal. No stridor. No respiratory distress. He has no wheezes. He has no rales.  Abdominal: Soft. Bowel sounds are normal. He exhibits no distension. There is generalized tenderness. There is no rebound and no guarding. No hernia.  Musculoskeletal:  He exhibits no edema or tenderness.  Neurological: He is alert. He has normal strength. No cranial nerve deficit (no facial droop, extraocular movements intact, no slurred speech) or sensory deficit. He exhibits normal muscle tone. He displays no seizure activity. Coordination normal.  Skin: Skin is warm and dry. No rash noted.  Psychiatric: He has a normal mood and affect.  Nursing note and vitals reviewed.    ED Treatments / Results  Labs (all labs ordered are listed, but only abnormal results are displayed) Labs Reviewed  BASIC METABOLIC PANEL - Abnormal; Notable for the following components:      Result Value   Chloride 96 (*)    Glucose, Bld 208 (*)    BUN 24 (*)    Creatinine, Ser 1.26 (*)    Calcium 8.5 (*)    Anion gap 18 (*)    All other components within normal limits  BLOOD GAS, VENOUS - Abnormal; Notable for the following components:   Acid-base deficit 2.1 (*)    All other components within normal limits  CBC WITH DIFFERENTIAL/PLATELET - Abnormal; Notable for the following components:   WBC 15.4 (*)    Neutro Abs 13.1 (*)    All other components within normal limits    EKG  EKG Interpretation None       Radiology Dg Abdomen Acute W/chest  Result Date: 06/13/2017 CLINICAL DATA:  Nausea and vomiting EXAM: DG ABDOMEN ACUTE W/ 1V CHEST COMPARISON:  06/09/2017 radiographs, chest x-ray 05/30/2017 FINDINGS: Single-view chest demonstrates a mild opacity at the left base not seen on dedicated views of the abdomen suggesting atelectasis or bony artifact. No focal consolidation. Normal heart size. Supine and upright views of the abdomen demonstrate no free air. Nonobstructed bowel-gas pattern with mild stool in the colon. No abnormal calcification. IMPRESSION: Negative abdominal radiographs.  No acute cardiopulmonary disease. Electronically Signed   By: Donavan Foil M.D.   On: 06/13/2017 15:34    Procedures Procedures (including critical care time)  Medications  Ordered in ED Medications  sodium chloride 0.9 % bolus 1,000 mL (1,000 mLs Intravenous  New Bag/Given 06/13/17 1341)    And  sodium chloride 0.9 % bolus 1,000 mL (1,000 mLs Intravenous New Bag/Given 06/13/17 1342)    And  0.9 %  sodium chloride infusion ( Intravenous New Bag/Given 06/13/17 1343)  promethazine (PHENERGAN) injection 12.5 mg (not administered)  metoCLOPramide (REGLAN) injection 10 mg (10 mg Intravenous Given 06/13/17 1342)     Initial Impression / Assessment and Plan / ED Course  I have reviewed the triage vital signs and the nursing notes.  Pertinent labs & imaging results that were available during my care of the patient were reviewed by me and considered in my medical decision making (see chart for details).  Clinical Course as of Jun 13 1618  Mon Jun 13, 2017  1608 WBC elevated but decreased from previous.  Electrolytes not significantly changed from baseline.  No signs of acidosis.  Likely nausea and vomiting from his gastroparesis.  No vomiting in the ED  [JK]  1618 I discussed the results with the patient.  No recurrent vomiting however he still feels nauseated.  We will give an additional dose of antiemetic  [JK]    Clinical Course User Index [JK] Dorie Rank, MD    Pt presents with nausea and vomiting.  Known history of recurrent episodes of nausea and vomiting.  Previous episodes of DKA.  No DKA today.  No vomiting int he ED  Final Clinical Impressions(s) / ED Diagnoses   Final diagnoses:  Gastroparesis  Nausea and vomiting, intractability of vomiting not specified, unspecified vomiting type    ED Discharge Orders        Ordered    metoCLOPramide (REGLAN) 10 MG tablet  Every 6 hours PRN     06/13/17 1619       Dorie Rank, MD 06/13/17 1620

## 2017-06-13 NOTE — ED Notes (Signed)
Called pt's mother to come pick pt up.  Told her pt will be waiting in the waiting room.  States she is on the way

## 2017-06-13 NOTE — Discharge Instructions (Signed)
Follow up with your primary care doctor and GI doctor for further evaluation.  If you do not have your own GI doctor, considering calling Dr Luvenia Starchourk's office for an appointment

## 2017-06-13 NOTE — ED Triage Notes (Signed)
Pt reports burning in chest x 3 weeks, n/v x 3 weeks.  EMS gave zofran 4mg  iv pta.  And ivf.  cbg was 266.

## 2017-06-17 ENCOUNTER — Other Ambulatory Visit (HOSPITAL_COMMUNITY): Payer: Self-pay | Admitting: Physician Assistant

## 2017-06-17 DIAGNOSIS — R634 Abnormal weight loss: Secondary | ICD-10-CM

## 2017-06-17 DIAGNOSIS — R112 Nausea with vomiting, unspecified: Secondary | ICD-10-CM

## 2017-06-23 ENCOUNTER — Ambulatory Visit (HOSPITAL_COMMUNITY): Payer: 59

## 2017-07-01 ENCOUNTER — Other Ambulatory Visit (HOSPITAL_COMMUNITY): Payer: 59

## 2017-07-01 ENCOUNTER — Ambulatory Visit (HOSPITAL_COMMUNITY): Payer: 59 | Admitting: Oncology

## 2017-08-22 ENCOUNTER — Ambulatory Visit: Payer: Self-pay | Admitting: Physician Assistant

## 2017-08-22 ENCOUNTER — Encounter: Payer: Self-pay | Admitting: Physician Assistant

## 2017-08-22 ENCOUNTER — Other Ambulatory Visit (HOSPITAL_COMMUNITY)
Admission: RE | Admit: 2017-08-22 | Discharge: 2017-08-22 | Disposition: A | Discharge status: Home / Self Care | Payer: Self-pay | Source: Ambulatory Visit | Attending: Physician Assistant | Admitting: Physician Assistant

## 2017-08-22 VITALS — BP 118/70 | HR 96 | Temp 97.7°F | Ht 67.25 in | Wt 164.0 lb

## 2017-08-22 DIAGNOSIS — F418 Other specified anxiety disorders: Secondary | ICD-10-CM

## 2017-08-22 DIAGNOSIS — Z7689 Persons encountering health services in other specified circumstances: Secondary | ICD-10-CM

## 2017-08-22 DIAGNOSIS — E1165 Type 2 diabetes mellitus with hyperglycemia: Secondary | ICD-10-CM | POA: Insufficient documentation

## 2017-08-22 DIAGNOSIS — F17219 Nicotine dependence, cigarettes, with unspecified nicotine-induced disorders: Secondary | ICD-10-CM

## 2017-08-22 DIAGNOSIS — I2510 Atherosclerotic heart disease of native coronary artery without angina pectoris: Secondary | ICD-10-CM

## 2017-08-22 DIAGNOSIS — I10 Essential (primary) hypertension: Secondary | ICD-10-CM

## 2017-08-22 DIAGNOSIS — K219 Gastro-esophageal reflux disease without esophagitis: Secondary | ICD-10-CM

## 2017-08-22 LAB — CBC WITH DIFFERENTIAL/PLATELET
BASOS ABS: 0 10*3/uL (ref 0.0–0.1)
BASOS PCT: 0 %
Eosinophils Absolute: 0.3 10*3/uL (ref 0.0–0.7)
Eosinophils Relative: 2 %
HCT: 43.3 % (ref 39.0–52.0)
Hemoglobin: 14.4 g/dL (ref 13.0–17.0)
LYMPHS PCT: 21 %
Lymphs Abs: 3.1 10*3/uL (ref 0.7–4.0)
MCH: 30.5 pg (ref 26.0–34.0)
MCHC: 33.3 g/dL (ref 30.0–36.0)
MCV: 91.7 fL (ref 78.0–100.0)
Monocytes Absolute: 0.5 10*3/uL (ref 0.1–1.0)
Monocytes Relative: 3 %
NEUTROS ABS: 10.5 10*3/uL — AB (ref 1.7–7.7)
Neutrophils Relative %: 74 %
Platelets: 225 10*3/uL (ref 150–400)
RBC: 4.72 MIL/uL (ref 4.22–5.81)
RDW: 14.1 % (ref 11.5–15.5)
WBC: 14.4 10*3/uL — AB (ref 4.0–10.5)

## 2017-08-22 LAB — COMPREHENSIVE METABOLIC PANEL
ALBUMIN: 3.6 g/dL (ref 3.5–5.0)
ALT: 11 U/L — ABNORMAL LOW (ref 17–63)
AST: 14 U/L — AB (ref 15–41)
Alkaline Phosphatase: 101 U/L (ref 38–126)
Anion gap: 11 (ref 5–15)
BILIRUBIN TOTAL: 1.1 mg/dL (ref 0.3–1.2)
BUN: 11 mg/dL (ref 6–20)
CHLORIDE: 100 mmol/L — AB (ref 101–111)
CO2: 24 mmol/L (ref 22–32)
Calcium: 9.2 mg/dL (ref 8.9–10.3)
Creatinine, Ser: 0.84 mg/dL (ref 0.61–1.24)
GFR calc Af Amer: >60 mL/min (ref >60)
Glucose, Bld: 363 mg/dL — ABNORMAL HIGH (ref 65–99)
POTASSIUM: 4.3 mmol/L (ref 3.5–5.1)
Sodium: 135 mmol/L (ref 135–145)
Total Protein: 7.1 g/dL (ref 6.5–8.1)

## 2017-08-22 LAB — LIPID PANEL
CHOLESTEROL: 211 mg/dL — AB (ref 0–200)
HDL: 33 mg/dL — ABNORMAL LOW (ref >40)
LDL Cholesterol: 157 mg/dL — ABNORMAL HIGH (ref 0–99)
Total CHOL/HDL Ratio: 6.4 RATIO
Triglycerides: 107 mg/dL (ref <150)
VLDL: 21 mg/dL (ref 0–40)

## 2017-08-22 LAB — HEMOGLOBIN A1C
HEMOGLOBIN A1C: 11.2 % — AB (ref 4.8–5.6)
MEAN PLASMA GLUCOSE: 274.74 mg/dL

## 2017-08-22 NOTE — Patient Instructions (Addendum)
BRING ALL MEDICATIONS TO EVERY APPOINTMENT   -get labs done

## 2017-08-22 NOTE — Progress Notes (Signed)
BP 118/70 (BP Location: Left Arm, Patient Position: Sitting, Cuff Size: Normal)   Pulse 96   Temp 97.7 F (36.5 C)   Ht 5' 7.25" (1.708 m)   Wt 164 lb (74.4 kg)   SpO2 99%   BMI 25.50 kg/m    Subjective:    Patient ID: [name] [name], male    DOB: Dec 12, 1970, 47 y.o.   MRN: [id number]  HPI: [name] [name] is a 47 y.o. male presenting on 08/22/2017 for New Patient (Initial Visit) (pt was seeing Shanon Rosser, [place] in [place] for the last 3-4 years. )   HPI  Chief Complaint  Patient presents with  . New Patient (Initial Visit)    pt was seeing Shanon Rosser, [place] in [place] for the last 3-4 years.     Labs in careeverywhere- Most recent a1c 03/07/17- 11.8  He Had sleep study December 2018  [place] summary 06/02/17 states a1c 8.7 but unable to confirm/see lab result.  Pt was in hospital for DKA  Pt was going to an urgent care for his primary care when he had insurance.  He was last seen there in February 2019.  He no longer has insurance.     He used to work for [employer].  He no longer works there.   He was out with worker's comp for a while prior to his dismissal.   Pt says dr Alzheimers at [facility] saw him for DM.  He says he was scheduled for EGD with dr Paulita Fujita (in [place]).   Pt went to [place] for sleep apnea.  He didn't get machine because he couldn't afford it (this was when he had insurance)  Pt doesn't see mental health specialist  Pt really has no idea what medications he is taking- he didn't bring his meds and doesn't know their names.    Pt states last DM eye exam was at [facility] last year  Pt says he has CAD but is not having chest pains.  He says he is having GI issues including burning.  Pt has listed allergies to omeprazole and esomeprazole.    Relevant past medical, surgical, family and social history reviewed and updated as indicated. Interim medical history since our last visit reviewed. Allergies and medications reviewed  and updated.   CURRENT MEDS: ???   Current Outpatient Medications:  .  acetaminophen (TYLENOL) 500 MG tablet, Take 1 tablet (500 mg total) by mouth every 6 (six) hours as needed. (Patient taking differently: Take 500 mg by mouth every 6 (six) hours as needed (for pain or headaches). ), Disp: 30 tablet, Rfl: 0 .  ALPRAZolam (XANAX) 1 MG tablet, Take 0.5-1 mg by mouth 2 (two) times daily as needed for anxiety. , Disp: , Rfl: 2 .  aspirin 81 MG chewable tablet, Chew 81 mg by mouth at bedtime. , Disp: , Rfl:  .  atorvastatin (LIPITOR) 40 MG tablet, Take 40 mg by mouth daily. , Disp: , Rfl:  .  azelastine (OPTIVAR) 0.05 % ophthalmic solution, Place 1 drop into both eyes 2 (two) times daily., Disp: , Rfl:  .  blood glucose meter kit and supplies, Dispense based on patient and insurance preference. Use up to four times daily as directed. (FOR ICD-10 E10.9, E11.9)., Disp: 1 each, Rfl: 0 .  buPROPion (WELLBUTRIN SR) 100 MG 12 hr tablet, Take 100 mg by mouth 2 (two) times daily. , Disp: , Rfl:  .  Cholecalciferol (VITAMIN D3) 5000 units TABS, Take  5,000 Units by mouth daily., Disp: , Rfl:  .  insulin lispro (HUMALOG KWIKPEN) 100 UNIT/ML KiwkPen, Check your blood sugar 4 times daily: before breakfast, before lunch, before dinner, before sleep.  Blood sugar 121 - 150: 1 unit BS 151 - 200: 2 units BS 201 - 250: 3 units BS 251 - 300: 5 units BS 301 - 350: 7 units BS 351 - 400: 9 units, Disp: 15 mL, Rfl: 0 .  Insulin Pen Needle 31G X 5 MM MISC, Use with humalog up to 4 times daily, Disp: 120 each, Rfl: 0 .  losartan (COZAAR) 100 MG tablet, Take 100 mg by mouth daily., Disp: , Rfl:  .  metFORMIN (GLUCOPHAGE-XR) 500 MG 24 hr tablet, Take 500 mg by mouth 2 (two) times daily., Disp: , Rfl:  .  metoCLOPramide (REGLAN) 10 MG tablet, Take 1 tablet (10 mg total) by mouth every 6 (six) hours as needed for nausea., Disp: 30 tablet, Rfl: 0 .  metoprolol tartrate (LOPRESSOR) 25 MG tablet, Take 1 tablet (25 mg total) by  mouth 2 (two) times daily., Disp: 60 tablet, Rfl: 0 .  multivitamin-lutein (OCUVITE-LUTEIN) CAPS capsule, Take 1 capsule by mouth daily., Disp: , Rfl:  .  neomycin-bacitracin-polymyxin (NEOSPORIN) ointment, Apply 1 application topically 3 (three) times daily. To the surgical site on your back, Disp: 15 g, Rfl: 0 .  ranitidine (ZANTAC) 150 MG tablet, Take 150 mg by mouth at bedtime. , Disp: , Rfl:  .  sertraline (ZOLOFT) 100 MG tablet, Take 200 mg by mouth at bedtime. , Disp: , Rfl:  .  vitamin B-12 (CYANOCOBALAMIN) 1000 MCG tablet, Take 1,000 mcg by mouth daily., Disp: , Rfl:  .  glimepiride (AMARYL) 4 MG tablet, Take 1 tablet by mouth daily., Disp: , Rfl: 6 .  GLYXAMBI 10-5 MG TABS, Take 1 tablet by mouth daily., Disp: , Rfl: 11 .  sucralfate (CARAFATE) 1 g tablet, Take 1 tablet (1 g total) by mouth 4 (four) times daily -  with meals and at bedtime for 14 days. (Patient not taking: Reported on 08/22/2017), Disp: 56 tablet, Rfl: 0 .  TRESIBA FLEXTOUCH 200 UNIT/ML SOPN, Inject 15 Units into the skin at bedtime., Disp: , Rfl: 1   Review of Systems  Constitutional: Positive for appetite change, chills, diaphoresis and unexpected weight change. Negative for fatigue and fever.  HENT: Negative for congestion, dental problem, drooling, ear pain, facial swelling, hearing loss, mouth sores, sneezing, sore throat, trouble swallowing and voice change.   Eyes: Positive for redness, itching and visual disturbance. Negative for pain and discharge.  Respiratory: Positive for shortness of breath. Negative for cough, choking and wheezing.   Cardiovascular: Positive for leg swelling. Negative for chest pain and palpitations.  Gastrointestinal: Positive for abdominal pain and constipation. Negative for blood in stool, diarrhea and vomiting.  Endocrine: Positive for polydipsia. Negative for cold intolerance and heat intolerance.  Genitourinary: Positive for decreased urine volume. Negative for dysuria and hematuria.   Musculoskeletal: Positive for arthralgias, back pain and gait problem.  Skin: Negative for rash.  Allergic/Immunologic: Negative for environmental allergies.  Neurological: Positive for light-headedness and headaches. Negative for seizures and syncope.  Hematological: Negative for adenopathy.  Psychiatric/Behavioral: Positive for dysphoric mood. Negative for agitation and suicidal ideas. The patient is nervous/anxious.     Per HPI unless specifically indicated above     Objective:    BP 118/70 (BP Location: Left Arm, Patient Position: Sitting, Cuff Size: Normal)   Pulse 96   Temp  97.7 F (36.5 C)   Ht 5' 7.25" (1.708 m)   Wt 164 lb (74.4 kg)   SpO2 99%   BMI 25.50 kg/m   Wt Readings from Last 3 Encounters:  08/22/17 164 lb (74.4 kg)  06/13/17 155 lb (70.3 kg)  06/09/17 156 lb (70.8 kg)    Physical Exam  Constitutional: He is oriented to person, place, and time. He appears well-developed and well-nourished.  HENT:  Head: Normocephalic and atraumatic.  Mouth/Throat: Oropharynx is clear and moist. No oropharyngeal exudate.  Eyes: Pupils are equal, round, and reactive to light. Conjunctivae and EOM are normal.  Neck: Neck supple. No thyromegaly present.  Cardiovascular: Normal rate and regular rhythm.  Pulmonary/Chest: Effort normal and breath sounds normal. He has no wheezes. He has no rales.  Abdominal: Soft. Bowel sounds are normal. He exhibits no mass. There is no hepatosplenomegaly. There is no tenderness.  Musculoskeletal: He exhibits no edema.  Lymphadenopathy:    He has no cervical adenopathy.  Neurological: He is alert and oriented to person, place, and time.  Skin: Skin is warm and dry. No rash noted.  Psychiatric: He has a normal mood and affect. His behavior is normal. Thought content normal.  Vitals reviewed.        Assessment & Plan:   Encounter Diagnoses  Name Primary?  . Encounter to establish care Yes  . Uncontrolled type 2 diabetes mellitus with  hyperglycemia ([place])   . Anxiety with depression   . Coronary artery disease involving native coronary artery of native heart without angina pectoris   . Essential hypertension   . Gastroesophageal reflux disease, esophagitis presence not specified   . Cigarette nicotine dependence with nicotine-induced disorder      -pt to get baseline fasting Labs drawn -pt referred to [facility] for [place] issues not controlled  -pt to follow up here WITH ALL MEDS 1 week.  RTO sooner prn.  Discussed with pt that we cannot adjust his medications if we don't even know what he is taking.  He states understanding.

## 2017-08-23 LAB — MICROALBUMIN, URINE: Microalb, Ur: 79.9 ug/mL — ABNORMAL HIGH

## 2017-08-29 ENCOUNTER — Encounter: Payer: Self-pay | Admitting: Physician Assistant

## 2017-08-29 ENCOUNTER — Ambulatory Visit: Payer: Self-pay | Admitting: Physician Assistant

## 2017-08-29 VITALS — BP 133/96 | HR 99 | Temp 97.7°F | Wt 163.5 lb

## 2017-08-29 DIAGNOSIS — E1165 Type 2 diabetes mellitus with hyperglycemia: Secondary | ICD-10-CM

## 2017-08-29 DIAGNOSIS — Z9119 Patient's noncompliance with other medical treatment and regimen: Secondary | ICD-10-CM

## 2017-08-29 DIAGNOSIS — Z91199 Patient's noncompliance with other medical treatment and regimen due to unspecified reason: Secondary | ICD-10-CM

## 2017-08-29 NOTE — Patient Instructions (Signed)
BRING ALL MEDICATIONS TO EVERY APPOINTMENT

## 2017-08-29 NOTE — Progress Notes (Signed)
BP (!) 133/96 (BP Location: Left Arm, Patient Position: Sitting, Cuff Size: Normal)   Pulse 99   Temp 97.7 F (36.5 C)   Wt 163 lb 8 oz (74.2 kg)   SpO2 100%   BMI 25.42 kg/m    Subjective:    Patient ID: [name] [name], male    DOB: September 06, 1970, 47 y.o.   MRN: [id number]  HPI: [name] [name] is a 47 y.o. male presenting on 08/29/2017 for Diabetes; Coronary Artery Disease; and Gastroesophageal Reflux   HPI   After long discussion with pt an initial office visit, he did not bring his medications with him.  He doesn't know what he is taking.  He has the list that was given to him at his initial office visit, which he didn't know what he was taking then either.    Relevant past medical, surgical, family and social history reviewed and updated as indicated. Interim medical history since our last visit reviewed. Allergies and medications reviewed and updated.  Meds: Insulin 3 units qhs   Review of Systems  Constitutional: Positive for chills. Negative for appetite change, diaphoresis, fatigue, fever and unexpected weight change.  HENT: Negative for congestion, drooling, ear pain, facial swelling, hearing loss, mouth sores, sneezing, sore throat, trouble swallowing and voice change.   Eyes: Positive for pain, discharge and itching. Negative for redness and visual disturbance.  Respiratory: Negative for cough, choking, shortness of breath and wheezing.   Cardiovascular: Negative for chest pain, palpitations and leg swelling.  Gastrointestinal: Negative for abdominal pain, blood in stool, constipation, diarrhea and vomiting.  Endocrine: Negative for cold intolerance, heat intolerance and polydipsia.  Genitourinary: Negative for decreased urine volume, dysuria and hematuria.  Musculoskeletal: Positive for arthralgias and back pain. Negative for gait problem.  Skin: Negative for rash.  Allergic/Immunologic: Negative for environmental allergies.  Neurological: Positive for headaches.  Negative for seizures, syncope and light-headedness.  Hematological: Negative for adenopathy.  Psychiatric/Behavioral: Positive for dysphoric mood. Negative for agitation and suicidal ideas. The patient is nervous/anxious.     Per HPI unless specifically indicated above     Objective:    BP (!) 133/96 (BP Location: Left Arm, Patient Position: Sitting, Cuff Size: Normal)   Pulse 99   Temp 97.7 F (36.5 C)   Wt 163 lb 8 oz (74.2 kg)   SpO2 100%   BMI 25.42 kg/m   Wt Readings from Last 3 Encounters:  08/29/17 163 lb 8 oz (74.2 kg)  08/22/17 164 lb (74.4 kg)  06/13/17 155 lb (70.3 kg)    Physical Exam  Constitutional: He is oriented to person, place, and time. He appears well-developed and well-nourished.  HENT:  Head: Normocephalic and atraumatic.  Pulmonary/Chest: Effort normal.  Neurological: He is alert and oriented to person, place, and time.  Skin: Skin is warm and dry.  Psychiatric: He has a normal mood and affect. His speech is normal. He is agitated.  Nursing note and vitals reviewed.   Results for orders placed or performed during the hospital encounter of 08/22/17  Lipid panel  Result Value Ref Range   Cholesterol 211 (H) 0 - 200 mg/dL   Triglycerides 409107 <811<150 mg/dL   HDL 33 (L) >91>40 mg/dL   Total CHOL/HDL Ratio 6.4 RATIO   VLDL 21 0 - 40 mg/dL   LDL Cholesterol 478157 (H) 0 - 99 mg/dL  Hemoglobin [id number]  Result Value Ref Range   Hgb A1c MFr Bld 11.2 (H) 4.8 - 5.6 %   Mean  Plasma Glucose 274.74 mg/dL  CBC w/Diff/Platelet  Result Value Ref Range   WBC 14.4 (H) 4.0 - 10.5 K/uL   RBC 4.72 4.22 - 5.81 MIL/uL   Hemoglobin 14.4 13.0 - 17.0 g/dL   HCT 16.1 09.6 - 04.5 %   MCV 91.7 78.0 - 100.0 fL   MCH 30.5 26.0 - 34.0 pg   MCHC 33.3 30.0 - 36.0 g/dL   RDW 40.9 81.1 - 91.4 %   Platelets 225 150 - 400 K/uL   Neutrophils Relative % 74 %   Neutro Abs 10.5 (H) 1.7 - 7.7 K/uL   Lymphocytes Relative 21 %   Lymphs Abs 3.1 0.7 - 4.0 K/uL   Monocytes Relative 3 %    Monocytes Absolute 0.5 0.1 - 1.0 K/uL   Eosinophils Relative 2 %   Eosinophils Absolute 0.3 0.0 - 0.7 K/uL   Basophils Relative 0 %   Basophils Absolute 0.0 0.0 - 0.1 K/uL  Comprehensive metabolic panel  Result Value Ref Range   Sodium 135 135 - 145 mmol/L   Potassium 4.3 3.5 - 5.1 mmol/L   Chloride 100 (L) 101 - 111 mmol/L   CO2 24 22 - 32 mmol/L   Glucose, Bld 363 (H) 65 - 99 mg/dL   BUN 11 6 - 20 mg/dL   Creatinine, Ser 7.82 0.61 - 1.24 mg/dL   Calcium 9.2 8.9 - 95.6 mg/dL   Total Protein 7.1 6.5 - 8.1 g/dL   Albumin 3.6 3.5 - 5.0 g/dL   AST 14 (L) 15 - 41 U/L   ALT 11 (L) 17 - 63 U/L   Alkaline Phosphatase 101 38 - 126 U/L   Total Bilirubin 1.1 0.3 - 1.2 mg/dL   GFR calc non Af Amer >60 >60 mL/min   GFR calc Af Amer >60 >60 mL/min   Anion gap 11 5 - 15  Microalbumin, urine  Result Value Ref Range   Microalb, Ur 79.9 (H) Not Estab. ug/mL      Assessment & Plan:   Encounter Diagnoses  Name Primary?  Marland Kitchen Uncontrolled type 2 diabetes mellitus with hyperglycemia (HCC) Yes  . Personal history of noncompliance with medical treatment, presenting hazards to health     -reviewed labs with pt -pt to increase his basal insulin to 10u qhs and monitor his fbs.  He is to bring log to follow up office visit and call office for fbs < 70 or > 300. -pt to follow up 1 week WITH ALL MEDS.  Explained to pt that it is very important for him to bring his medications with him because it is unsafe to adjust his medications with not knowing what he is taking.  Pt angry because he has the list (which was included with his AVS from appointment last week)

## 2017-09-05 ENCOUNTER — Ambulatory Visit: Payer: Self-pay | Admitting: Physician Assistant

## 2017-09-05 ENCOUNTER — Encounter: Payer: Self-pay | Admitting: Physician Assistant

## 2017-09-05 VITALS — BP 102/70 | HR 73 | Temp 97.7°F | Ht 67.25 in | Wt 162.0 lb

## 2017-09-05 DIAGNOSIS — I10 Essential (primary) hypertension: Secondary | ICD-10-CM

## 2017-09-05 DIAGNOSIS — E1165 Type 2 diabetes mellitus with hyperglycemia: Secondary | ICD-10-CM

## 2017-09-05 DIAGNOSIS — K219 Gastro-esophageal reflux disease without esophagitis: Secondary | ICD-10-CM

## 2017-09-05 DIAGNOSIS — E785 Hyperlipidemia, unspecified: Secondary | ICD-10-CM

## 2017-09-05 DIAGNOSIS — H1010 Acute atopic conjunctivitis, unspecified eye: Secondary | ICD-10-CM

## 2017-09-05 DIAGNOSIS — I2510 Atherosclerotic heart disease of native coronary artery without angina pectoris: Secondary | ICD-10-CM

## 2017-09-05 DIAGNOSIS — F17219 Nicotine dependence, cigarettes, with unspecified nicotine-induced disorders: Secondary | ICD-10-CM

## 2017-09-05 MED ORDER — INSULIN GLARGINE 100 UNIT/ML SOLOSTAR PEN: 10.0000 [IU] | PEN_INJECTOR | Freq: Every day | SUBCUTANEOUS | 0 refills | Status: DC

## 2017-09-05 MED ORDER — METOCLOPRAMIDE HCL 5 MG PO TABS: 5.0000 mg | ORAL_TABLET | Freq: Two times a day (BID) | ORAL | 1 refills | Status: DC | PRN

## 2017-09-05 MED ORDER — KETOTIFEN FUMARATE 0.025 % OP SOLN: 1.0000 [drp] | Freq: Two times a day (BID) | OPHTHALMIC | 0 refills | Status: DC | PRN

## 2017-09-05 MED ORDER — METFORMIN HCL 1000 MG PO TABS
1000.0000 mg | ORAL_TABLET | Freq: Two times a day (BID) | ORAL | 1 refills | Status: DC
Start: 2017-09-05 — End: 2017-09-08

## 2017-09-05 NOTE — Progress Notes (Signed)
BP 102/70 (BP Location: Left Arm, Patient Position: Sitting, Cuff Size: Normal)   Pulse 73   Temp 97.7 F (36.5 C) (Other (Comment))   Ht 5' 7.25" (1.708 m)   Wt 162 lb (73.5 kg)   SpO2 97%   BMI 25.18 kg/m    Subjective:    Patient ID: [name] [name], male    DOB: 15-Jul-1970, 47 y.o.   MRN: [id number]  HPI: [name] [name] is a 47 y.o. male presenting on 09/05/2017 for Diabetes   HPI  He has appt with [facility] on April 10  Pt says his stomach is always hurting.  He was given zofran, ranitidine and metoclopramide in the ER.   He says that those eased off his pain.    Pt has been using 10 units humalog qhs   He checks his bs at home usually qd, usually in the am.  He says runs 135-160 lately.    Relevant past medical, surgical, family and social history reviewed and updated as indicated. Interim medical history since our last visit reviewed. Allergies and medications reviewed and updated.   Current Outpatient Medications:  .  aspirin 81 MG tablet, Take 81 mg by mouth daily., Disp: , Rfl:  .  Calcium Carb-Cholecalciferol (CALCIUM 600/VITAMIN D3) 600-800 MG-UNIT TABS, Take by mouth., Disp: , Rfl:  .  insulin lispro protamine-lispro (HUMALOG 50/50 MIX) (50-50) 100 UNIT/ML SUSP injection, Inject into the skin 2 (two) times daily before a meal., Disp: , Rfl:  .  atorvastatin (LIPITOR) 40 MG tablet, Take 40 mg by mouth daily., Disp: , Rfl:  .  azelastine (OPTIVAR) 0.05 % ophthalmic solution, Place 1 drop into both eyes 2 (two) times daily., Disp: , Rfl:  .  buPROPion (WELLBUTRIN) 100 MG tablet, Take 100 mg by mouth 2 (two) times daily., Disp: , Rfl:  .  ciprofloxacin (CIPRO) 250 MG tablet, Take 250 mg by mouth 3 (three) times daily. For 10 days, Disp: , Rfl:  .  losartan (COZAAR) 100 MG tablet, Take 100 mg by mouth daily., Disp: , Rfl:  .  metFORMIN (GLUCOPHAGE) 500 MG tablet, Take 500 mg by mouth 2 (two) times daily., Disp: , Rfl:  .  metoCLOPramide (REGLAN) 5 MG tablet, Take 5 mg  by mouth 3 (three) times daily., Disp: , Rfl:  .  metoprolol tartrate (LOPRESSOR) 25 MG tablet, Take 25 mg by mouth 2 (two) times daily., Disp: , Rfl:  .  ondansetron (ZOFRAN) 8 MG tablet, Take by mouth. Dissolve 1 tablet on the tongue 3 times a day as needed for nausea and vomiting, Disp: , Rfl:  .  ranitidine (ZANTAC) 150 MG tablet, Take 150 mg by mouth 2 (two) times daily., Disp: , Rfl:  .  sertraline (ZOLOFT) 100 MG tablet, Take by mouth daily. Take 1 and 1/2 tablet by mouth every day for 1 week, then take 2 tabs by mouth every day, Disp: , Rfl:   Review of Systems  Constitutional: Negative for appetite change, chills, diaphoresis, fatigue, fever and unexpected weight change.  HENT: Negative for congestion, dental problem, drooling, ear pain, facial swelling, hearing loss, mouth sores, sneezing, sore throat, trouble swallowing and voice change.   Eyes: Negative for pain, discharge, redness, itching and visual disturbance.  Respiratory: Negative for cough, choking, shortness of breath and wheezing.   Cardiovascular: Negative for chest pain, palpitations and leg swelling.  Gastrointestinal: Negative for abdominal pain, blood in stool, constipation, diarrhea and vomiting.  Endocrine: Negative for cold intolerance, heat intolerance  and polydipsia.  Genitourinary: Negative for decreased urine volume, dysuria and hematuria.  Musculoskeletal: Negative for arthralgias, back pain and gait problem.  Skin: Negative for rash.  Allergic/Immunologic: Negative for environmental allergies.  Neurological: Negative for seizures, syncope, light-headedness and headaches.  Hematological: Negative for adenopathy.  Psychiatric/Behavioral: Negative for agitation, dysphoric mood and suicidal ideas. The patient is not nervous/anxious.     Per HPI unless specifically indicated above     Objective:    BP 102/70 (BP Location: Left Arm, Patient Position: Sitting, Cuff Size: Normal)   Pulse 73   Temp 97.7 F  (36.5 C) (Other (Comment))   Ht 5' 7.25" (1.708 m)   Wt 162 lb (73.5 kg)   SpO2 97%   BMI 25.18 kg/m   Wt Readings from Last 3 Encounters:  09/05/17 162 lb (73.5 kg)  08/29/17 163 lb 8 oz (74.2 kg)  08/22/17 164 lb (74.4 kg)    Physical Exam  Constitutional: He is oriented to person, place, and time. He appears well-developed and well-nourished.  HENT:  Head: Normocephalic and atraumatic.  Neck: Neck supple.  Cardiovascular: Normal rate and regular rhythm.  Pulmonary/Chest: Effort normal and breath sounds normal. He has no wheezes.  Abdominal: Soft. Bowel sounds are normal. There is no hepatosplenomegaly. There is no tenderness.  Musculoskeletal: He exhibits no edema.  Lymphadenopathy:    He has no cervical adenopathy.  Neurological: He is alert and oriented to person, place, and time.  Skin: Skin is warm and dry.  Psychiatric: He has a normal mood and affect. His behavior is normal.  Vitals reviewed.   Results for orders placed or performed during the hospital encounter of 08/22/17  Lipid panel  Result Value Ref Range   Cholesterol 211 (H) 0 - 200 mg/dL   Triglycerides 161 <096 mg/dL   HDL 33 (L) >04 mg/dL   Total CHOL/HDL Ratio 6.4 RATIO   VLDL 21 0 - 40 mg/dL   LDL Cholesterol 540 (H) 0 - 99 mg/dL  Hemoglobin J8J  Result Value Ref Range   Hgb A1c MFr Bld 11.2 (H) 4.8 - 5.6 %   Mean Plasma Glucose 274.74 mg/dL  CBC w/Diff/Platelet  Result Value Ref Range   WBC 14.4 (H) 4.0 - 10.5 K/uL   RBC 4.72 4.22 - 5.81 MIL/uL   Hemoglobin 14.4 13.0 - 17.0 g/dL   HCT 19.1 47.8 - 29.5 %   MCV 91.7 78.0 - 100.0 fL   MCH 30.5 26.0 - 34.0 pg   MCHC 33.3 30.0 - 36.0 g/dL   RDW 62.1 30.8 - 65.7 %   Platelets 225 150 - 400 K/uL   Neutrophils Relative % 74 %   Neutro Abs 10.5 (H) 1.7 - 7.7 K/uL   Lymphocytes Relative 21 %   Lymphs Abs 3.1 0.7 - 4.0 K/uL   Monocytes Relative 3 %   Monocytes Absolute 0.5 0.1 - 1.0 K/uL   Eosinophils Relative 2 %   Eosinophils Absolute 0.3 0.0 -  0.7 K/uL   Basophils Relative 0 %   Basophils Absolute 0.0 0.0 - 0.1 K/uL  Comprehensive metabolic panel  Result Value Ref Range   Sodium 135 135 - 145 mmol/L   Potassium 4.3 3.5 - 5.1 mmol/L   Chloride 100 (L) 101 - 111 mmol/L   CO2 24 22 - 32 mmol/L   Glucose, Bld 363 (H) 65 - 99 mg/dL   BUN 11 6 - 20 mg/dL   Creatinine, Ser 8.46 0.61 - 1.24 mg/dL   Calcium  9.2 8.9 - 10.3 mg/dL   Total Protein 7.1 6.5 - 8.1 g/dL   Albumin 3.6 3.5 - 5.0 g/dL   AST 14 (L) 15 - 41 U/L   ALT 11 (L) 17 - 63 U/L   Alkaline Phosphatase 101 38 - 126 U/L   Total Bilirubin 1.1 0.3 - 1.2 mg/dL   GFR calc non Af Amer >60 >60 mL/min   GFR calc Af Amer >60 >60 mL/min   Anion gap 11 5 - 15  Microalbumin, urine  Result Value Ref Range   Microalb, Ur 79.9 (H) Not Estab. ug/mL      Assessment & Plan:   Encounter Diagnoses  Name Primary?  Marland Kitchen Uncontrolled type 2 diabetes mellitus with hyperglycemia (HCC) Yes  . Essential hypertension   . Hyperlipidemia, unspecified hyperlipidemia type   . Coronary artery disease involving native coronary artery of native heart without angina pectoris   . Gastroesophageal reflux disease, esophagitis presence not specified   . Cigarette nicotine dependence with nicotine-induced disorder   . Allergic conjunctivitis, unspecified laterality     -reviewed labs with pt  -Increase metformin to 1 g bid from [facility] -gave Sample eye drop for allergy eyes (zaditor) -pt to Continue atorvastatin 40 from [facility].  Counseled pt to follow lowfat diet -pt to Continue Metoprolol bid due to CAD -pt to Stop humalog and start lantus 10u qhs.  He is to monitor fbs and call office for readings > 300 or < 70 -pt to follow up with bs log 1 month.  RTO sooner prn

## 2017-09-08 ENCOUNTER — Other Ambulatory Visit: Payer: Self-pay | Admitting: Physician Assistant

## 2017-09-08 MED ORDER — ATORVASTATIN CALCIUM 40 MG PO TABS: 40.0000 mg | ORAL_TABLET | Freq: Every day | ORAL | 1 refills | Status: DC

## 2017-09-08 MED ORDER — METFORMIN HCL 1000 MG PO TABS: 1000.0000 mg | ORAL_TABLET | Freq: Two times a day (BID) | ORAL | 1 refills | Status: DC

## 2017-09-08 MED ORDER — METOPROLOL TARTRATE 25 MG PO TABS: 25.0000 mg | ORAL_TABLET | Freq: Two times a day (BID) | ORAL | 0 refills | Status: DC

## 2017-09-19 ENCOUNTER — Other Ambulatory Visit: Payer: Self-pay | Admitting: Physician Assistant

## 2017-10-05 ENCOUNTER — Encounter: Payer: Self-pay | Admitting: Physician Assistant

## 2017-10-05 ENCOUNTER — Ambulatory Visit: Payer: Medicaid Other | Admitting: Physician Assistant

## 2017-10-05 VITALS — BP 140/96 | HR 105 | Temp 97.7°F | Ht 67.25 in | Wt 167.0 lb

## 2017-10-05 DIAGNOSIS — F17219 Nicotine dependence, cigarettes, with unspecified nicotine-induced disorders: Secondary | ICD-10-CM

## 2017-10-05 DIAGNOSIS — F418 Other specified anxiety disorders: Secondary | ICD-10-CM

## 2017-10-05 DIAGNOSIS — R079 Chest pain, unspecified: Secondary | ICD-10-CM

## 2017-10-05 DIAGNOSIS — I10 Essential (primary) hypertension: Secondary | ICD-10-CM

## 2017-10-05 DIAGNOSIS — I25119 Atherosclerotic heart disease of native coronary artery with unspecified angina pectoris: Secondary | ICD-10-CM

## 2017-10-05 DIAGNOSIS — E1165 Type 2 diabetes mellitus with hyperglycemia: Secondary | ICD-10-CM

## 2017-10-05 DIAGNOSIS — K219 Gastro-esophageal reflux disease without esophagitis: Secondary | ICD-10-CM

## 2017-10-05 DIAGNOSIS — E785 Hyperlipidemia, unspecified: Secondary | ICD-10-CM

## 2017-10-05 MED ORDER — NITROGLYCERIN 0.4 MG SL SUBL: 0.4000 mg | SUBLINGUAL_TABLET | SUBLINGUAL | 3 refills | Status: DC | PRN

## 2017-10-05 NOTE — Patient Instructions (Signed)
Nitroglycerin sublingual tablets What is this medicine? NITROGLYCERIN (nye troe GLI ser in) is a type of vasodilator. It relaxes blood vessels, increasing the blood and oxygen supply to your heart. This medicine is used to relieve chest pain caused by angina. It is also used to prevent chest pain before activities like climbing stairs, going outdoors in cold weather, or sexual activity. This medicine may be used for other purposes; ask your health care provider or pharmacist if you have questions. COMMON BRAND NAME(S): Nitroquick, Nitrostat, Nitrotab What should I tell my health care provider before I take this medicine? They need to know if you have any of these conditions: -anemia -head injury, recent stroke, or bleeding in the brain -liver disease -previous heart attack -an unusual or allergic reaction to nitroglycerin, other medicines, foods, dyes, or preservatives -pregnant or trying to get pregnant -breast-feeding How should I use this medicine? Take this medicine by mouth as needed. At the first sign of an angina attack (chest pain or tightness) place one tablet under your tongue. You can also take this medicine 5 to 10 minutes before an event likely to produce chest pain. Follow the directions on the prescription label. Let the tablet dissolve under the tongue. Do not swallow whole. Replace the dose if you accidentally swallow it. It will help if your mouth is not dry. Saliva around the tablet will help it to dissolve more quickly. Do not eat or drink, smoke or chew tobacco while a tablet is dissolving. If you are not better within 5 minutes after taking ONE dose of nitroglycerin, call 9-1-1 immediately to seek emergency medical care. Do not take more than 3 nitroglycerin tablets over 15 minutes. If you take this medicine often to relieve symptoms of angina, your doctor or health care professional may provide you with different instructions to manage your symptoms. If symptoms do not go away  after following these instructions, it is important to call 9-1-1 immediately. Do not take more than 3 nitroglycerin tablets over 15 minutes. Talk to your pediatrician regarding the use of this medicine in children. Special care may be needed. Overdosage: If you think you have taken too much of this medicine contact a poison control center or emergency room at once. NOTE: This medicine is only for you. Do not share this medicine with others. What if I miss a dose? This does not apply. This medicine is only used as needed. What may interact with this medicine? Do not take this medicine with any of the following medications: -certain migraine medicines like ergotamine and dihydroergotamine (DHE) -medicines used to treat erectile dysfunction like sildenafil, tadalafil, and vardenafil -riociguat This medicine may also interact with the following medications: -alteplase -aspirin -heparin -medicines for high blood pressure -medicines for mental depression -other medicines used to treat angina -phenothiazines like chlorpromazine, mesoridazine, prochlorperazine, thioridazine This list may not describe all possible interactions. Give your health care provider a list of all the medicines, herbs, non-prescription drugs, or dietary supplements you use. Also tell them if you smoke, drink alcohol, or use illegal drugs. Some items may interact with your medicine. What should I watch for while using this medicine? Tell your doctor or health care professional if you feel your medicine is no longer working. Keep this medicine with you at all times. Sit or lie down when you take your medicine to prevent falling if you feel dizzy or faint after using it. Try to remain calm. This will help you to feel better faster. If you feel   dizzy, take several deep breaths and lie down with your feet propped up, or bend forward with your head resting between your knees. You may get drowsy or dizzy. Do not drive, use machinery,  or do anything that needs mental alertness until you know how this drug affects you. Do not stand or sit up quickly, especially if you are an older patient. This reduces the risk of dizzy or fainting spells. Alcohol can make you more drowsy and dizzy. Avoid alcoholic drinks. Do not treat yourself for coughs, colds, or pain while you are taking this medicine without asking your doctor or health care professional for advice. Some ingredients may increase your blood pressure. What side effects may I notice from receiving this medicine? Side effects that you should report to your doctor or health care professional as soon as possible: -blurred vision -dry mouth -skin rash -sweating -the feeling of extreme pressure in the head -unusually weak or tired Side effects that usually do not require medical attention (report to your doctor or health care professional if they continue or are bothersome): -flushing of the face or neck -headache -irregular heartbeat, palpitations -nausea, vomiting This list may not describe all possible side effects. Call your doctor for medical advice about side effects. You may report side effects to FDA at 1-800-FDA-1088. Where should I keep my medicine? Keep out of the reach of children. Store at room temperature between 20 and 25 degrees C (68 and 77 degrees F). Store in original container. Protect from light and moisture. Keep tightly closed. Throw away any unused medicine after the expiration date. NOTE: This sheet is a summary. It may not cover all possible information. If you have questions about this medicine, talk to your doctor, pharmacist, or health care provider.  2018 [employer] Standard (2013-03-15 17:57:36)  

## 2017-10-05 NOTE — Progress Notes (Signed)
BP (!) 140/96 (BP Location: Left Arm, Patient Position: Sitting, Cuff Size: Normal)   Pulse (!) 105   Temp 97.7 F (36.5 C) (Other (Comment))   Ht 5' 7.25" (1.708 m)   Wt 167 lb (75.8 kg)   SpO2 98%   BMI 25.96 kg/m    Subjective:    Patient ID: [name] [name], male    DOB: 08-01-1970, 47 y.o.   MRN: [id number]  HPI: [name] [name] is a 47 y.o. male presenting on 10/05/2017 for Diabetes (c/o chest tightness and sob with activity, sittting down and resting alleviates discomfort , spent last night at [facility] with 29 yo brother who is on vent and has CHF)   HPI  Chief Complaint  Patient presents with  . Diabetes    c/o chest tightness and sob with activity, sittting down and resting alleviates discomfort , spent last night at [facility] with 84 yo brother who is on vent and has CHF    Pt is here today for follow-up dm. He did not bring his bs log.   He States his bs running around 160 now.  He denies lows.  Pt states he started having chest pains several years ago.  He says it hasn't gotten any worse lately however he didn't mention these at previous OV.  He gets CP with emotional situations and with exertion.  He gets improvement with rest.  He says he had NTG in the past but doesn't have any now.   Pt had LHC 0/28/2016 which showed normal EF,  Moderate CAD with 50% LAD stenosis.  Pt has not seen cardiologist since he had the LHC.   Pt is high risk due to being an uncontrolled diabetic with htn and hyperlipidemia and he is a smoker.  Relevant past medical, surgical, family and social history reviewed and updated as indicated. Interim medical history since our last visit reviewed. Allergies and medications reviewed and updated.   Current Outpatient Medications:  .  aspirin 81 MG tablet, Take 81 mg by mouth daily., Disp: , Rfl:  .  atorvastatin (LIPITOR) 40 MG tablet, Take 1 tablet (40 mg total) by mouth daily., Disp: 90 tablet, Rfl: 1 .  buPROPion (WELLBUTRIN) 100 MG tablet, Take 100  mg by mouth 2 (two) times daily., Disp: , Rfl:  .  Calcium Carb-Cholecalciferol (CALCIUM 600/VITAMIN D3) 600-800 MG-UNIT TABS, Take by mouth., Disp: , Rfl:  .  hydrOXYzine (ATARAX/VISTARIL) 25 MG tablet, Take 25 mg by mouth 3 (three) times daily as needed (takes 1 tab bid prn and1-2 tabs @@ hs prn)., Disp: , Rfl:  .  Insulin Glargine (LANTUS SOLOSTAR) 100 UNIT/ML Solostar Pen, Inject 10 Units into the skin at bedtime., Disp: 1 pen, Rfl: 0 .  ketotifen (ZADITOR) 0.025 % ophthalmic solution, Place 1 drop into both eyes 2 (two) times daily as needed., Disp: 5 mL, Rfl: 0 .  metFORMIN (GLUCOPHAGE) 1000 MG tablet, TAKE 1 Tablet  BY MOUTH TWICE DAILY WITH MEALS, Disp: 180 tablet, Rfl: 0 .  metoCLOPramide (REGLAN) 5 MG tablet, Take 1 tablet (5 mg total) by mouth 2 (two) times daily as needed for nausea., Disp: 60 tablet, Rfl: 1 .  metoprolol tartrate (LOPRESSOR) 25 MG tablet, Take 1 tablet (25 mg total) by mouth 2 (two) times daily., Disp: 180 tablet, Rfl: 0 .  ondansetron (ZOFRAN) 8 MG tablet, Take by mouth. Dissolve 1 tablet on the tongue 3 times a day as needed for nausea and vomiting, Disp: , Rfl:  .  ranitidine (ZANTAC) 150 MG tablet, Take 150 mg by mouth 2 (two) times daily., Disp: , Rfl:  .  sertraline (ZOLOFT) 100 MG tablet, Take by mouth daily. Take 1 and 1/2 tablet by mouth every day for 1 week, then take 2 tabs by mouth every day, Disp: , Rfl:    Review of Systems  Constitutional: Positive for appetite change, diaphoresis and unexpected weight change. Negative for chills, fatigue and fever.  HENT: Positive for congestion and sneezing. Negative for dental problem, drooling, ear pain, facial swelling, hearing loss, mouth sores, sore throat, trouble swallowing and voice change.   Eyes: Positive for pain, discharge, redness and itching. Negative for visual disturbance.  Respiratory: Positive for shortness of breath. Negative for cough, choking and wheezing.   Cardiovascular: Positive for chest pain  and leg swelling. Negative for palpitations.  Gastrointestinal: Positive for abdominal pain, constipation and diarrhea. Negative for blood in stool and vomiting.  Endocrine: Positive for polydipsia. Negative for cold intolerance and heat intolerance.  Genitourinary: Negative for decreased urine volume, dysuria and hematuria.  Musculoskeletal: Positive for arthralgias. Negative for back pain and gait problem.  Skin: Negative for rash.  Allergic/Immunologic: Negative for environmental allergies.  Neurological: Positive for light-headedness and headaches. Negative for seizures and syncope.  Hematological: Negative for adenopathy.  Psychiatric/Behavioral: Positive for dysphoric mood. Negative for agitation and suicidal ideas. The patient is nervous/anxious.     Per HPI unless specifically indicated above     Objective:    BP (!) 140/96 (BP Location: Left Arm, Patient Position: Sitting, Cuff Size: Normal)   Pulse (!) 105   Temp 97.7 F (36.5 C) (Other (Comment))   Ht 5' 7.25" (1.708 m)   Wt 167 lb (75.8 kg)   SpO2 98%   BMI 25.96 kg/m   Wt Readings from Last 3 Encounters:  10/05/17 167 lb (75.8 kg)  09/05/17 162 lb (73.5 kg)  08/29/17 163 lb 8 oz (74.2 kg)    Physical Exam  Constitutional: He is oriented to person, place, and time. He appears well-developed and well-nourished.  HENT:  Head: Normocephalic and atraumatic.  Neck: Neck supple.  Cardiovascular: Normal rate and regular rhythm.  Pulmonary/Chest: Effort normal and breath sounds normal. He has no wheezes.  Abdominal: Soft. Bowel sounds are normal. There is no hepatosplenomegaly. There is no tenderness.  Musculoskeletal: He exhibits no edema.  Lymphadenopathy:    He has no cervical adenopathy.  Neurological: He is alert and oriented to person, place, and time.  Skin: Skin is warm and dry.  Psychiatric: He has a normal mood and affect. His behavior is normal.  Vitals reviewed.    EKG- sinus rhythm at 95bpm.  BBB.  No  changes compared with EKG from June 10, 2017.      Assessment & Plan:    Encounter Diagnoses  Name Primary?  Marland Kitchen Uncontrolled type 2 diabetes mellitus with hyperglycemia (HCC) Yes  . Coronary artery disease involving native coronary artery of native heart with angina pectoris (HCC)   . Chest pain, unspecified type   . Essential hypertension   . Hyperlipidemia, unspecified hyperlipidemia type   . Cigarette nicotine dependence with nicotine-induced disorder   . Gastroesophageal reflux disease, esophagitis presence not specified   . Anxiety with depression     -Increase lantus to 15units.  Pt to monitor fbs and call office for fbs < 70 or > 300 -will Monitor bp.  It was on the low side at last OV.  Likely elevated today due to worries  of his hospitalized brother -will Refer to cardiology for further evaluation of CP  -pt was given rx NTG and was counseled on how to take it properly, including going to ER for persistent pain after 3 doses. -Pt was given [facility] charity care application -pt to follow up 1 month with bs log.  RTO sooner prn

## 2017-10-06 ENCOUNTER — Other Ambulatory Visit: Payer: Self-pay | Admitting: Physician Assistant

## 2017-10-06 DIAGNOSIS — I10 Essential (primary) hypertension: Secondary | ICD-10-CM

## 2017-10-06 DIAGNOSIS — I25119 Atherosclerotic heart disease of native coronary artery with unspecified angina pectoris: Secondary | ICD-10-CM

## 2017-10-06 DIAGNOSIS — E785 Hyperlipidemia, unspecified: Secondary | ICD-10-CM

## 2017-10-06 DIAGNOSIS — E1165 Type 2 diabetes mellitus with hyperglycemia: Secondary | ICD-10-CM

## 2017-10-12 ENCOUNTER — Encounter: Payer: Self-pay | Admitting: Cardiovascular Disease

## 2017-11-07 ENCOUNTER — Ambulatory Visit: Payer: Medicaid Other | Admitting: Physician Assistant

## 2017-11-14 ENCOUNTER — Encounter: Payer: Self-pay | Admitting: Physician Assistant

## 2017-12-14 ENCOUNTER — Ambulatory Visit: Payer: Medicaid Other | Admitting: Physician Assistant

## 2017-12-14 ENCOUNTER — Encounter: Payer: Self-pay | Admitting: Physician Assistant

## 2017-12-14 VITALS — BP 145/88 | HR 107 | Temp 98.1°F | Ht 67.25 in | Wt 166.0 lb

## 2017-12-14 DIAGNOSIS — E785 Hyperlipidemia, unspecified: Secondary | ICD-10-CM

## 2017-12-14 DIAGNOSIS — F418 Other specified anxiety disorders: Secondary | ICD-10-CM

## 2017-12-14 DIAGNOSIS — F17219 Nicotine dependence, cigarettes, with unspecified nicotine-induced disorders: Secondary | ICD-10-CM

## 2017-12-14 DIAGNOSIS — I25119 Atherosclerotic heart disease of native coronary artery with unspecified angina pectoris: Secondary | ICD-10-CM

## 2017-12-14 DIAGNOSIS — I10 Essential (primary) hypertension: Secondary | ICD-10-CM

## 2017-12-14 DIAGNOSIS — R079 Chest pain, unspecified: Secondary | ICD-10-CM

## 2017-12-14 DIAGNOSIS — Z9119 Patient's noncompliance with other medical treatment and regimen: Secondary | ICD-10-CM

## 2017-12-14 DIAGNOSIS — Z91199 Patient's noncompliance with other medical treatment and regimen due to unspecified reason: Secondary | ICD-10-CM

## 2017-12-14 DIAGNOSIS — E1165 Type 2 diabetes mellitus with hyperglycemia: Secondary | ICD-10-CM

## 2017-12-14 NOTE — Patient Instructions (Addendum)
 -  Get labs drawn (fasting- nothing to eat after midnight)  - Call cardiology for appointment-  [phone]  -Monitor blood sugar

## 2017-12-14 NOTE — Progress Notes (Signed)
BP (!) 145/88 (BP Location: Right Arm, Patient Position: Sitting, Cuff Size: Normal)   Pulse (!) 107   Temp 98.1 F (36.7 C) (Other (Comment))   Ht 5' 7.25" (1.708 m)   Wt 166 lb (75.3 kg)   SpO2 99%   BMI 25.81 kg/m    Subjective:    Patient ID: Rennis Petty, male    DOB: 09-10-1970, 47 y.o.   MRN: [id number]  HPI: HYDE SIRES is a 47 y.o. male presenting on 12/14/2017 for Follow-up   HPI   Pt here for follow-up of DM, htn and CP  Pt was last seen in May.  He was no-show to his June follow up appointment.  Pt did not get his labs drawn as discussed at last OV.  Pt was referred to cardiology for CP but they were unable to get in touch with him.   Pt says he is still getting CP usually with exertion and associated with SOB.  Pt is out of all of his meds.  He says it's because he doesn't have money but all his meds come to him free of charge.   He then says he just didn't call [facility] for refills.  Pt is currently going to [facility] for MH issues.  Pt says he wants disability because he can't work and wants me to write a letter stating this.  When asked, he says his disability is he just lacks motivation.  He was told that is not a disability.  If he has MH issues then he needs to discuss those at [facility].  Relevant past medical, surgical, family and social history reviewed and updated as indicated. Interim medical history since our last visit reviewed. Allergies and medications reviewed and updated.  CURRENT MEDS: Nitrostat ASA 81mg  zaditor Calcium with vit d   Review of Systems  Constitutional: Positive for appetite change, diaphoresis and unexpected weight change. Negative for chills, fatigue and fever.  HENT: Positive for drooling and mouth sores. Negative for congestion, dental problem, ear pain, facial swelling, hearing loss, sneezing, sore throat, trouble swallowing and voice change.   Eyes: Positive for pain, discharge, redness, itching and visual  disturbance.  Respiratory: Negative for cough, choking, shortness of breath and wheezing.   Cardiovascular: Positive for chest pain and leg swelling. Negative for palpitations.  Gastrointestinal: Positive for abdominal pain, constipation and diarrhea. Negative for blood in stool and vomiting.  Endocrine: Positive for cold intolerance, heat intolerance and polydipsia.  Genitourinary: Positive for decreased urine volume. Negative for dysuria and hematuria.  Musculoskeletal: Positive for arthralgias, back pain and gait problem.  Skin: Negative for rash.  Allergic/Immunologic: Negative for environmental allergies.  Neurological: Positive for light-headedness and headaches. Negative for seizures and syncope.  Hematological: Negative for adenopathy.  Psychiatric/Behavioral: Positive for dysphoric mood. Negative for agitation and suicidal ideas. The patient is nervous/anxious.     Per HPI unless specifically indicated above     Objective:    BP (!) 145/88 (BP Location: Right Arm, Patient Position: Sitting, Cuff Size: Normal)   Pulse (!) 107   Temp 98.1 F (36.7 C) (Other (Comment))   Ht 5' 7.25" (1.708 m)   Wt 166 lb (75.3 kg)   SpO2 99%   BMI 25.81 kg/m   Wt Readings from Last 3 Encounters:  12/14/17 166 lb (75.3 kg)  10/05/17 167 lb (75.8 kg)  09/05/17 162 lb (73.5 kg)    Physical Exam  Constitutional: He is oriented to person, place, and time. He appears  well-developed and well-nourished.  HENT:  Head: Normocephalic and atraumatic.  Neck: Neck supple.  Cardiovascular: Normal rate and regular rhythm.  Pulmonary/Chest: Effort normal and breath sounds normal. He has no wheezes.  Abdominal: Soft. Bowel sounds are normal. There is no hepatosplenomegaly. There is no tenderness.  Musculoskeletal: He exhibits no edema.  Lymphadenopathy:    He has no cervical adenopathy.  Neurological: He is alert and oriented to person, place, and time.  Skin: Skin is warm and dry.  Psychiatric: He  has a normal mood and affect. His behavior is normal.  Vitals reviewed.       Assessment & Plan:   Encounter Diagnoses  Name Primary?  . Personal history of noncompliance with medical treatment, presenting hazards to health Yes  . Uncontrolled type 2 diabetes mellitus with hyperglycemia (HCC)   . Coronary artery disease involving native coronary artery of native heart with angina pectoris (HCC)   . Essential hypertension   . Hyperlipidemia, unspecified hyperlipidemia type   . Chest pain, unspecified type   . Cigarette nicotine dependence with nicotine-induced disorder   . Anxiety with depression     -Pt was given information for [employer] to help with his transportation issues -pt was given another 1 pen of lantus (he still hasn't gotten approved for assistance with his insulin) -pt is instructed to call [facility] and get back on his medications -pt to get fasting labs drawn -pt to call cardiology to get appointment in order to get his chest pain evaluated -pt to continue to monitor his blood sugar.  Discussed that he needs to watch his diabetic diet as his readings are all over the place -[facility] issues -pt to follow up here 1 month.  RTO sooner prn

## 2018-01-05 ENCOUNTER — Ambulatory Visit: Payer: Medicaid Other | Admitting: Physician Assistant

## 2018-01-10 ENCOUNTER — Encounter: Payer: Self-pay | Admitting: Physician Assistant

## 2018-01-10 ENCOUNTER — Ambulatory Visit: Payer: Medicaid Other | Admitting: Physician Assistant

## 2018-01-10 VITALS — BP 132/86 | HR 104 | Temp 98.1°F | Ht 67.25 in | Wt 165.0 lb

## 2018-01-10 DIAGNOSIS — E785 Hyperlipidemia, unspecified: Secondary | ICD-10-CM

## 2018-01-10 DIAGNOSIS — Z91199 Patient's noncompliance with other medical treatment and regimen due to unspecified reason: Secondary | ICD-10-CM

## 2018-01-10 DIAGNOSIS — I25119 Atherosclerotic heart disease of native coronary artery with unspecified angina pectoris: Secondary | ICD-10-CM

## 2018-01-10 DIAGNOSIS — I10 Essential (primary) hypertension: Secondary | ICD-10-CM

## 2018-01-10 DIAGNOSIS — Z9119 Patient's noncompliance with other medical treatment and regimen: Secondary | ICD-10-CM

## 2018-01-10 DIAGNOSIS — R079 Chest pain, unspecified: Secondary | ICD-10-CM

## 2018-01-10 DIAGNOSIS — E1165 Type 2 diabetes mellitus with hyperglycemia: Secondary | ICD-10-CM

## 2018-01-10 NOTE — Patient Instructions (Signed)
Financial Counselor- [phone] (to check on your [facility] application)

## 2018-01-10 NOTE — Progress Notes (Signed)
BP 132/86 (BP Location: Left Arm, Patient Position: Sitting, Cuff Size: Normal)   Pulse (!) 104   Temp 98.1 F (36.7 C)   Ht 5' 7.25" (1.708 m)   Wt 165 lb (74.8 kg)   SpO2 98%   BMI 25.65 kg/m    Subjective:    Patient ID: [name] [name], male    DOB: 23-Oct-1970, 47 y.o.   MRN: [id number]  HPI: [name] [name] is a 47 y.o. male presenting on 01/10/2018 for Diabetes; Hypertension; and Chest Pain   HPI   Pt says he called [facility] and got back on his meds but he didn't bring them today.  He called and got appointment with cardiology for later this week.   Pt says he turned in his [facility] application (in May).     He gets chest pains still- he says it will start if he is walking too much.   Pt had LHC in 2016 which shows 50% stenosis of LAD.   Pt has still not gotten blood labs that were to be done months ago.  No labs done since March.   [facility].   He says he is checking his bs.  He has log sheet with 10 readings on it.  Range 165-215  Relevant past medical, surgical, family and social history reviewed and updated as indicated. Interim medical history since our last visit reviewed. Allergies and medications reviewed and updated.   Current Outpatient Medications:  .  aspirin 81 MG tablet, Take 81 mg by mouth daily., Disp: , Rfl:  .  atorvastatin (LIPITOR) 40 MG tablet, Take 1 tablet (40 mg total) by mouth daily., Disp: 90 tablet, Rfl: 1 .  buPROPion (WELLBUTRIN) 100 MG tablet, Take 100 mg by mouth 2 (two) times daily., Disp: , Rfl:  .  Calcium Carb-Cholecalciferol (CALCIUM 600/VITAMIN D3) 600-800 MG-UNIT TABS, Take by mouth., Disp: , Rfl:  .  hydrOXYzine (ATARAX/VISTARIL) 25 MG tablet, Take 25 mg by mouth 3 (three) times daily as needed (takes 1 tab bid prn and1-2 tabs @@ hs prn)., Disp: , Rfl:  .  Insulin Glargine (LANTUS SOLOSTAR) 100 UNIT/ML Solostar Pen, Inject 10 Units into the skin at bedtime. (Patient taking differently:  Inject 15 Units into the skin at bedtime. ), Disp: 1 pen, Rfl: 0 .  ketotifen (ZADITOR) 0.025 % ophthalmic solution, Place 1 drop into both eyes 2 (two) times daily as needed., Disp: 5 mL, Rfl: 0 .  metFORMIN (GLUCOPHAGE) 1000 MG tablet, TAKE 1 Tablet  BY MOUTH TWICE DAILY WITH MEALS, Disp: 180 tablet, Rfl: 0 .  metoCLOPramide (REGLAN) 5 MG tablet, Take 1 tablet (5 mg total) by mouth 2 (two) times daily as needed for nausea., Disp: 60 tablet, Rfl: 1 .  metoprolol tartrate (LOPRESSOR) 25 MG tablet, Take 1 tablet (25 mg total) by mouth 2 (two) times daily., Disp: 180 tablet, Rfl: 0 .  nitroGLYCERIN (NITROSTAT) 0.4 MG SL tablet, Place 1 tablet (0.4 mg total) under the tongue every 5 (five) minutes as needed for chest pain., Disp: 25 tablet, Rfl: 3 .  ondansetron (ZOFRAN) 8 MG tablet, Take by mouth. Dissolve 1 tablet on the tongue 3 times a day as needed for nausea and vomiting, Disp: , Rfl:  .  ranitidine (ZANTAC) 150 MG tablet, Take 150 mg by mouth 2 (two) times daily., Disp: , Rfl:  .  sertraline (ZOLOFT) 100 MG tablet, Take 200 mg by mouth daily. Take 1 and  1/2 tablet by mouth every day for 1 week, then take 2 tabs by mouth every day , Disp: , Rfl:   Review of Systems  Constitutional: Positive for appetite change, chills, diaphoresis and unexpected weight change. Negative for fatigue and fever.  HENT: Positive for congestion, drooling, ear pain, mouth sores, sneezing, sore throat and voice change. Negative for dental problem, facial swelling, hearing loss and trouble swallowing.   Eyes: Positive for pain, discharge, redness, itching and visual disturbance.  Respiratory: Positive for cough and wheezing. Negative for choking and shortness of breath.   Cardiovascular: Negative for chest pain, palpitations and leg swelling.  Gastrointestinal: Positive for abdominal pain and diarrhea. Negative for blood in stool, constipation and vomiting.  Endocrine: Positive for polydipsia. Negative for cold  intolerance and heat intolerance.  Genitourinary: Positive for decreased urine volume. Negative for dysuria and hematuria.  Musculoskeletal: Positive for arthralgias and back pain. Negative for gait problem.  Skin: Negative for rash.  Allergic/Immunologic: Positive for environmental allergies.  Neurological: Positive for light-headedness and headaches. Negative for seizures and syncope.  Hematological: Negative for adenopathy.  Psychiatric/Behavioral: Positive for dysphoric mood. Negative for agitation and suicidal ideas. The patient is nervous/anxious.     Per HPI unless specifically indicated above     Objective:    BP 132/86 (BP Location: Left Arm, Patient Position: Sitting, Cuff Size: Normal)   Pulse (!) 104   Temp 98.1 F (36.7 C)   Ht 5' 7.25" (1.708 m)   Wt 165 lb (74.8 kg)   SpO2 98%   BMI 25.65 kg/m   Wt Readings from Last 3 Encounters:  01/10/18 165 lb (74.8 kg)  12/14/17 166 lb (75.3 kg)  10/05/17 167 lb (75.8 kg)    Physical Exam  Constitutional: He is oriented to person, place, and time. He appears well-developed and well-nourished.  HENT:  Head: Normocephalic and atraumatic.  Neck: Neck supple.  Cardiovascular: Normal rate and regular rhythm.  Pulmonary/Chest: Effort normal and breath sounds normal. He has no wheezes.  Abdominal: Soft. Bowel sounds are normal. There is no hepatosplenomegaly. There is no tenderness.  Musculoskeletal: He exhibits no edema.  Lymphadenopathy:    He has no cervical adenopathy.  Neurological: He is alert and oriented to person, place, and time.  Skin: Skin is warm and dry.  Psychiatric: He has a normal mood and affect. His behavior is normal.  Vitals reviewed.        Assessment & Plan:   Encounter Diagnoses  Name Primary?  . Personal history of noncompliance with medical treatment, presenting hazards to health Yes  . Uncontrolled type 2 diabetes mellitus with hyperglycemia (HCC)   . Coronary artery disease involving  native coronary artery of native heart with angina pectoris (HCC)   . Essential hypertension   . Hyperlipidemia, unspecified hyperlipidemia type   . Chest pain, unspecified type      -Increase lantus to 25 units -pt to Get labs drawn -pt to Go to cardiologist appointment as scheduled for CP -pt given contact information for financial counselor so he can check on his charity care application -pt to follow up with bs log in 1 month.  RTO sooner prn

## 2018-01-11 ENCOUNTER — Ambulatory Visit: Payer: Medicaid Other | Admitting: Physician Assistant

## 2018-01-11 ENCOUNTER — Other Ambulatory Visit (HOSPITAL_COMMUNITY)
Admission: RE | Admit: 2018-01-11 | Discharge: 2018-01-11 | Disposition: A | Discharge status: Home / Self Care | Payer: Medicaid Other | Source: Ambulatory Visit | Attending: Physician Assistant | Admitting: Physician Assistant

## 2018-01-11 ENCOUNTER — Encounter: Payer: Self-pay | Admitting: Cardiology

## 2018-01-11 DIAGNOSIS — I25119 Atherosclerotic heart disease of native coronary artery with unspecified angina pectoris: Secondary | ICD-10-CM

## 2018-01-11 DIAGNOSIS — I10 Essential (primary) hypertension: Secondary | ICD-10-CM | POA: Insufficient documentation

## 2018-01-11 DIAGNOSIS — E785 Hyperlipidemia, unspecified: Secondary | ICD-10-CM

## 2018-01-11 DIAGNOSIS — E1165 Type 2 diabetes mellitus with hyperglycemia: Secondary | ICD-10-CM

## 2018-01-11 LAB — COMPREHENSIVE METABOLIC PANEL
ALT: 16 U/L (ref 0–44)
ANION GAP: 12 (ref 5–15)
AST: 16 U/L (ref 15–41)
Albumin: 3.7 g/dL (ref 3.5–5.0)
Alkaline Phosphatase: 103 U/L (ref 38–126)
BUN: 19 mg/dL (ref 6–20)
CHLORIDE: 97 mmol/L — AB (ref 98–111)
CO2: 26 mmol/L (ref 22–32)
Calcium: 9.4 mg/dL (ref 8.9–10.3)
Creatinine, Ser: 1.03 mg/dL (ref 0.61–1.24)
GFR calc non Af Amer: >60 mL/min (ref >60)
Glucose, Bld: 373 mg/dL — ABNORMAL HIGH (ref 70–99)
POTASSIUM: 4.9 mmol/L (ref 3.5–5.1)
Sodium: 135 mmol/L (ref 135–145)
Total Bilirubin: 1 mg/dL (ref 0.3–1.2)
Total Protein: 7.2 g/dL (ref 6.5–8.1)

## 2018-01-11 LAB — LIPID PANEL
CHOL/HDL RATIO: 7 ratio
Cholesterol: 272 mg/dL — ABNORMAL HIGH (ref 0–200)
HDL: 39 mg/dL — AB (ref >40)
LDL CALC: 188 mg/dL — AB (ref 0–99)
TRIGLYCERIDES: 227 mg/dL — AB (ref <150)
VLDL: 45 mg/dL — ABNORMAL HIGH (ref 0–40)

## 2018-01-11 LAB — HEMOGLOBIN A1C
HEMOGLOBIN A1C: 13 % — AB (ref 4.8–5.6)
Mean Plasma Glucose: 326.4 mg/dL

## 2018-01-11 NOTE — Progress Notes (Signed)
Cardiology Office Note  Date: 01/12/2018   ID: [name] [name], DOB March 27, 1971, MRN [id number]  PCP: Jacquelin HawkingMcElroy, Shannon, PA-C  Consulting Cardiologist: Nona DellSamuel Sanika Brosious, MD   Chief Complaint  Patient presents with  . Coronary Artery Disease    History of Present Illness: [name] [name] is a 47 y.o. male referred for cardiology consultation by Ms. McElroy PA-C with history of chest pain and moderate LAD disease by cardiac catheterization in 2016 per Dr. Jacinto HalimGanji.  He has not had any regular cardiology follow-up.  I reviewed records and updated the chart.  He tells me that he has had recurring chest discomfort, describes a tightness or burning sensation, sometimes up into his neck and shoulders, sometimes into the epigastric area.  This occurs sporadically, has been associated with exertion however, also cold weather.  Symptoms have been present for at least 3 years.  He has had poor control of type 2 diabetes mellitus and hyperlipidemia.  Recent lab work outlined below.  He states that he does take his medications.  He does have a family history of premature CAD which we discussed today.  I talked with him quite frankly about his need to get better control of his cardiac risk factors to make more a positive impact on his cardiac prognosis.  We discussed medication adjustments that we will make and also follow-up testing.  Smoking cessation is also very important.  Past Medical History:  Diagnosis Date  . Anxiety   . CAD (coronary artery disease)    Moderate LAD disease 2016 - Dr. Jacinto HalimGanji  . Depression   . GERD (gastroesophageal reflux disease)   . Hyperlipemia   . Hypertension   . Sleep apnea   . Type 2 diabetes mellitus (HCC)     Past Surgical History:  Procedure Laterality Date  . ANKLE SURGERY Bilateral 1982  . APPENDECTOMY  1975  . CARDIAC CATHETERIZATION N/A 03/28/2015   Procedure: Left Heart Cath and Coronary Angiography;  Surgeon: Yates DecampJay Ganji, MD;  [facility];  Service: Cardiovascular;  Laterality: N/A;  . CARDIAC CATHETERIZATION N/A 03/28/2015   Procedure: Intravascular Pressure Wire/FFR Study;  Surgeon: Yates DecampJay Ganji, MD;  [facility];  Service: Cardiovascular;  Laterality: N/A;  . CARPAL TUNNEL RELEASE Left   . COLONOSCOPY WITH ESOPHAGOGASTRODUODENOSCOPY (EGD)    . ELBOW FRACTURE SURGERY Left   . PILONIDAL CYST EXCISION N/A 03/24/2017   Procedure: EXCISION CHRONIC  PILONIDAL ABSCESS;  Surgeon: [name] MiyamotoBlackman, Douglas, MD;  Location: [facility] ORS;  Service: General;  Laterality: N/A;    Current Outpatient Medications  Medication Sig Dispense Refill  . aspirin 81 MG tablet Take 81 mg by mouth daily.    Marland Kitchen. atorvastatin (LIPITOR) 80 MG tablet Take 1 tablet (80 mg total) by mouth daily. 90 tablet 3  . buPROPion (WELLBUTRIN) 100 MG tablet Take 100 mg by mouth 2 (two) times daily.    . Calcium Carb-Cholecalciferol (CALCIUM 600/VITAMIN D3) 600-800 MG-UNIT TABS Take by mouth.    . hydrOXYzine (ATARAX/VISTARIL) 25 MG tablet Take 25 mg by mouth 3 (three) times daily as needed (takes 1 tab bid prn and1-2 tabs @@ hs prn).    . Insulin Glargine (LANTUS SOLOSTAR) 100 UNIT/ML Solostar Pen Inject 10 Units into the skin at bedtime. (Patient taking differently: Inject 15 Units into the skin at bedtime. ) 1 pen 0  . ketotifen (ZADITOR) 0.025 % ophthalmic solution Place 1 drop into both eyes 2 (two) times daily as needed. 5 mL 0  .  metFORMIN (GLUCOPHAGE) 1000 MG tablet TAKE 1 Tablet  BY MOUTH TWICE DAILY WITH MEALS 180 tablet 0  . metoCLOPramide (REGLAN) 5 MG tablet Take 1 tablet (5 mg total) by mouth 2 (two) times daily as needed for nausea. 60 tablet 1  . metoprolol tartrate (LOPRESSOR) 25 MG tablet Take 1.5 tablets (37.5 mg total) by mouth 2 (two) times daily. 270 tablet 3  . nitroGLYCERIN (NITROSTAT) 0.4 MG SL tablet Place 1 tablet (0.4 mg total) under the tongue every 5 (five) minutes as needed for chest pain. 25 tablet 3  . ondansetron (ZOFRAN) 8 MG tablet  Take by mouth. Dissolve 1 tablet on the tongue 3 times a day as needed for nausea and vomiting    . ranitidine (ZANTAC) 150 MG tablet Take 150 mg by mouth 2 (two) times daily.    . sertraline (ZOLOFT) 100 MG tablet Take 200 mg by mouth daily. Take 1 and 1/2 tablet by mouth every day for 1 week, then take 2 tabs by mouth every day     . isosorbide mononitrate (IMDUR) 30 MG 24 hr tablet Take 0.5 tablets (15 mg total) by mouth daily. 45 tablet 3   No current facility-administered medications for this visit.    Allergies:  Omeprazole magnesium and Esomeprazole   Social History: The patient  reports that he has been smoking cigarettes. He has a 16.00 pack-year smoking history. He has never used smokeless tobacco. He reports that he does not drink alcohol or use drugs.   Family History: The patient's family history includes Congestive Heart Failure in his sister; Diabetes in his father and mother; Hypertension in his father and mother.   ROS:  Please see the history of present illness. Otherwise, complete review of systems is positive for neuropathy symptoms.  All other systems are reviewed and negative.   Physical Exam: VS:  BP 134/74 (BP Location: Right Arm)   Pulse 93   Wt 170 lb (77.1 kg)   SpO2 97%   BMI 26.43 kg/m , BMI Body mass index is 26.43 kg/m.  Wt Readings from Last 3 Encounters:  01/12/18 170 lb (77.1 kg)  01/10/18 165 lb (74.8 kg)  12/14/17 166 lb (75.3 kg)    General: Chronically ill-appearing male appears comfortable at rest. HEENT: Conjunctiva and lids normal, oropharynx clear with poor dentition. Neck: Supple, no elevated JVP or carotid bruits, no thyromegaly. Lungs: Clear to auscultation, nonlabored breathing at rest. Cardiac: Regular rate and rhythm, no S3 or significant systolic murmur, no pericardial rub. Abdomen: Soft, nontender, bowel sounds present. Extremities: No pitting edema, distal pulses 1-2+. Skin: Warm and dry. Musculoskeletal: No  kyphosis. Neuropsychiatric: Alert and oriented x3, affect grossly appropriate.  ECG: I personally reviewed the tracing from 10/05/2017 which showed sinus rhythm with incomplete right bundle branch block and leftward axis.  Recent Labwork: 05/24/2017: TSH 1.772 05/27/2017: Magnesium 2.0 08/22/2017: Hemoglobin 14.4; Platelets 225 01/11/2018: ALT 16; AST 16; BUN 19; Creatinine, Ser 1.03; Potassium 4.9; Sodium 135     Component Value Date/Time   CHOL 272 (H) 01/11/2018 0844   TRIG 227 (H) 01/11/2018 0844   HDL 39 (L) 01/11/2018 0844   CHOLHDL 7.0 01/11/2018 0844   VLDL 45 (H) 01/11/2018 0844   LDLCALC 188 (H) 01/11/2018 0844  March 2019: Hemoglobin A1c 11.2  Other Studies Reviewed Today:  Cardiac catheterization 03/28/2015: 1. Normal LV systolic function, EF 55-60%. 2. Moderate coronary artery disease, proximal to midsegment of the LAD showing a diffuse 50% stenosis, there is rapid tapering  of the LAD from the proximal segment to the midsegment and followed by a hazy 50% stenosis. Mid to distal LAD also had a 50% stenosis. FFR to this region was negative for hemodynamic significance. There is minimal disease in the circumflex and RCA. Codominant system.  Assessment and Plan:  1.  CAD with angina symptoms and history of moderate LAD disease as of 2016.  Patient has ongoing risk factors including uncontrolled type 2 diabetes mellitus and hyperlipidemia as well as tobacco abuse.  I talked with him about these issues today.  I reinforced medication compliance and we also discussed some changes to be made.  Plan is to increase Lopressor to 37.5 mg twice daily, add Imdur 15 mg in the evening, and increase Lipitor to 80 mg daily.  We will proceed with a Lexiscan Myoview to assess ischemic burden following medication adjustments.  2.  Uncontrolled type 2 diabetes mellitus, hemoglobin A1c was 11.2 in March.  I reviewed his current regimen, he is following with PCP.  3.  Hyperlipidemia with LDL 188.   Patient tells me that this is while on medical therapy with Lipitor.  Presuming compliance we will increase dose to 80 mg daily.  4.  Tobacco abuse, smoking cessation discussed.  Current medicines were reviewed with the patient today.   Orders Placed This Encounter  Procedures  . NM Myocar Multi W/Spect W/Wall Motion / EF    Disposition: Follow-up in 4 to 6 weeks.  Signed, Jonelle SidleSamuel G. Effie Wahlert, MD, [facility] 01/12/2018 3:30 PM    [facility] [address], [place], [place] [address] Phone: [phone]; Fax: [phone]

## 2018-01-12 ENCOUNTER — Ambulatory Visit (INDEPENDENT_AMBULATORY_CARE_PROVIDER_SITE_OTHER): Payer: Self-pay | Admitting: Cardiology

## 2018-01-12 ENCOUNTER — Encounter: Payer: Self-pay | Admitting: Cardiology

## 2018-01-12 VITALS — BP 134/74 | HR 93 | Ht 67.0 in | Wt 170.0 lb

## 2018-01-12 DIAGNOSIS — I25119 Atherosclerotic heart disease of native coronary artery with unspecified angina pectoris: Secondary | ICD-10-CM

## 2018-01-12 DIAGNOSIS — E782 Mixed hyperlipidemia: Secondary | ICD-10-CM

## 2018-01-12 DIAGNOSIS — E1165 Type 2 diabetes mellitus with hyperglycemia: Secondary | ICD-10-CM

## 2018-01-12 DIAGNOSIS — Z720 Tobacco use: Secondary | ICD-10-CM

## 2018-01-12 MED ORDER — ATORVASTATIN CALCIUM 80 MG PO TABS: 80.0000 mg | ORAL_TABLET | Freq: Every day | ORAL | 3 refills | Status: DC

## 2018-01-12 MED ORDER — ISOSORBIDE MONONITRATE ER 30 MG PO TB24: 15.0000 mg | ORAL_TABLET | Freq: Every day | ORAL | 3 refills | Status: DC

## 2018-01-12 MED ORDER — METOPROLOL TARTRATE 25 MG PO TABS: 37.5000 mg | ORAL_TABLET | Freq: Two times a day (BID) | ORAL | 3 refills | Status: DC

## 2018-01-12 NOTE — Addendum Note (Signed)
Addended by: Marlyn CorporalARLTON, CATHERINE A on: 01/12/2018 03:45 PM   Modules accepted: Orders

## 2018-01-12 NOTE — Patient Instructions (Addendum)
Your physician wants you to follow-up in:  4-6 weeks    Your physician has requested that you have a lexiscan myoview. For further information please visit [email or web address] Please follow instruction sheet, as given.     START Imdur 15 mg at bedtime   INCREASE Lopressor to 37.5 mg ( 1 1/2 tablets)   Twice a day     INCREASE Lipitor to 80 mg at dinner     No labs today       Thank you for choosing  [facility] !

## 2018-01-16 ENCOUNTER — Encounter (HOSPITAL_BASED_OUTPATIENT_CLINIC_OR_DEPARTMENT_OTHER)
Admission: RE | Admit: 2018-01-16 | Discharge: 2018-01-16 | Disposition: A | Discharge status: Home / Self Care | Payer: Self-pay | Source: Ambulatory Visit | Attending: Cardiology | Admitting: Cardiology

## 2018-01-16 ENCOUNTER — Ambulatory Visit (HOSPITAL_COMMUNITY)
Admission: RE | Admit: 2018-01-16 | Discharge: 2018-01-16 | Disposition: A | Discharge status: Home / Self Care | Payer: Self-pay | Source: Ambulatory Visit | Attending: Cardiology | Admitting: Cardiology

## 2018-01-16 DIAGNOSIS — R9439 Abnormal result of other cardiovascular function study: Secondary | ICD-10-CM | POA: Insufficient documentation

## 2018-01-16 DIAGNOSIS — I25119 Atherosclerotic heart disease of native coronary artery with unspecified angina pectoris: Secondary | ICD-10-CM

## 2018-01-16 LAB — NM MYOCAR MULTI W/SPECT W/WALL MOTION / EF
CHL CUP NUCLEAR SDS: 0
CHL CUP RESTING HR STRESS: 80 {beats}/min
LHR: 0.33
LV dias vol: 74 mL (ref 62–150)
LVSYSVOL: 28 mL
Peak HR: 105 {beats}/min
SRS: 3
SSS: 3
TID: 1.13

## 2018-01-16 MED ORDER — TECHNETIUM TC 99M TETROFOSMIN IV KIT
10.0000 | PACK | Freq: Once | INTRAVENOUS | Status: AC | PRN
Start: 2018-01-16 — End: 2018-01-16
  Administered 2018-01-16: 10 via INTRAVENOUS

## 2018-01-16 MED ORDER — REGADENOSON 0.4 MG/5ML IV SOLN
INTRAVENOUS | Status: AC
  Administered 2018-01-16: 0.4 mg via INTRAVENOUS
  Filled 2018-01-16: qty 5

## 2018-01-16 MED ORDER — SODIUM CHLORIDE 0.9% FLUSH
INTRAVENOUS | Status: AC
  Administered 2018-01-16: 10 mL via INTRAVENOUS
  Filled 2018-01-16: qty 10

## 2018-01-16 MED ORDER — TECHNETIUM TC 99M TETROFOSMIN IV KIT
30.0000 | PACK | Freq: Once | INTRAVENOUS | Status: AC | PRN
  Administered 2018-01-16: 30 via INTRAVENOUS

## 2018-02-09 ENCOUNTER — Ambulatory Visit: Payer: Medicaid Other | Admitting: Physician Assistant

## 2018-02-09 ENCOUNTER — Encounter: Payer: Self-pay | Admitting: Physician Assistant

## 2018-02-09 VITALS — BP 115/75 | HR 78

## 2018-02-09 DIAGNOSIS — E1165 Type 2 diabetes mellitus with hyperglycemia: Secondary | ICD-10-CM

## 2018-02-09 DIAGNOSIS — I10 Essential (primary) hypertension: Secondary | ICD-10-CM

## 2018-02-09 DIAGNOSIS — F17219 Nicotine dependence, cigarettes, with unspecified nicotine-induced disorders: Secondary | ICD-10-CM

## 2018-02-09 DIAGNOSIS — E785 Hyperlipidemia, unspecified: Secondary | ICD-10-CM

## 2018-02-09 DIAGNOSIS — F418 Other specified anxiety disorders: Secondary | ICD-10-CM

## 2018-02-09 DIAGNOSIS — I25119 Atherosclerotic heart disease of native coronary artery with unspecified angina pectoris: Secondary | ICD-10-CM

## 2018-02-09 DIAGNOSIS — K219 Gastro-esophageal reflux disease without esophagitis: Secondary | ICD-10-CM

## 2018-02-09 NOTE — Progress Notes (Signed)
BP 115/75   Pulse 78   SpO2 98%    Subjective:    Patient ID: [name] [name], male    DOB: 24-Dec-1970, 47 y.o.   MRN: [id number]  HPI: [name] [name] is a 47 y.o. male presenting on 02/09/2018 for Diabetes   HPI   Pt went to cardiology- his lopressor and atorvastatin were increased,  and imdur was added.   all Rx were sent to [facility] but pt says he hasn't gotten them yet. A lexiscan was ordered  [facility] so [facility] has to go back and rescreen  Pt says he checks his bs but he has no log.  He says it runs 140s-180s.  Relevant past medical, surgical, family and social history reviewed and updated as indicated. Interim medical history since our last visit reviewed. Allergies and medications reviewed and updated.   Current Outpatient Medications:  .  aspirin 81 MG tablet, Take 81 mg by mouth daily., Disp: , Rfl:  .  azelastine (OPTIVAR) 0.05 % ophthalmic solution, 1 drop 2 (two) times daily., Disp: , Rfl:  .  Calcium Carb-Cholecalciferol (CALCIUM 600/VITAMIN D3) 600-800 MG-UNIT TABS, Take by mouth., Disp: , Rfl:  .  hydrOXYzine (ATARAX/VISTARIL) 25 MG tablet, Take 25 mg by mouth 3 (three) times daily as needed (takes 1 tab bid prn and1-2 tabs @@ hs prn)., Disp: , Rfl:  .  Insulin Glargine (LANTUS SOLOSTAR) 100 UNIT/ML Solostar Pen, Inject 10 Units into the skin at bedtime. (Patient taking differently: Inject 15 Units into the skin at bedtime. ), Disp: 1 pen, Rfl: 0 .  metFORMIN (GLUCOPHAGE) 1000 MG tablet, TAKE 1 Tablet  BY MOUTH TWICE DAILY WITH MEALS, Disp: 180 tablet, Rfl: 0 .  metoprolol tartrate (LOPRESSOR) 25 MG tablet, Take 1.5 tablets (37.5 mg total) by mouth 2 (two) times daily., Disp: 270 tablet, Rfl: 3 .  nitroGLYCERIN (NITROSTAT) 0.4 MG SL tablet, Place 1 tablet (0.4 mg total) under the tongue every 5 (five) minutes as needed for chest pain., Disp: 25 tablet, Rfl: 3 .  atorvastatin (LIPITOR) 80 MG tablet, Take 1 tablet (80 mg total) by mouth daily.  (Patient not taking: Reported on 02/09/2018), Disp: 90 tablet, Rfl: 3 .  isosorbide mononitrate (IMDUR) 30 MG 24 hr tablet, Take 0.5 tablets (15 mg total) by mouth daily. (Patient not taking: Reported on 02/09/2018), Disp: 45 tablet, Rfl: 3   Review of Systems  Constitutional: Positive for appetite change, chills and diaphoresis. Negative for fatigue, fever and unexpected weight change.  HENT: Positive for congestion, drooling, ear pain and sore throat. Negative for dental problem, facial swelling, hearing loss, mouth sores, sneezing, trouble swallowing and voice change.   Eyes: Positive for pain, discharge, redness, itching and visual disturbance.  Respiratory: Negative for cough, choking, shortness of breath and wheezing.   Cardiovascular: Positive for chest pain and leg swelling. Negative for palpitations.  Gastrointestinal: Positive for abdominal pain, constipation and diarrhea. Negative for blood in stool and vomiting.  Endocrine: Positive for polydipsia. Negative for cold intolerance and heat intolerance.  Genitourinary: Positive for decreased urine volume. Negative for dysuria and hematuria.  Musculoskeletal: Positive for arthralgias and back pain. Negative for gait problem.  Skin: Negative for rash.  Allergic/Immunologic: Positive for environmental allergies.  Neurological: Positive for syncope, light-headedness and headaches. Negative for seizures.  Hematological: Negative for adenopathy.  Psychiatric/Behavioral: Positive for agitation and dysphoric mood. Negative for suicidal ideas. The patient is nervous/anxious.     Per HPI unless specifically indicated  above     Objective:    BP 115/75   Pulse 78   SpO2 98%   Wt Readings from Last 3 Encounters:  01/12/18 170 lb (77.1 kg)  01/10/18 165 lb (74.8 kg)  12/14/17 166 lb (75.3 kg)    Physical Exam  Constitutional: He is oriented to person, place, and time. He appears well-developed and well-nourished.  HENT:  Head:  Normocephalic and atraumatic.  Neck: Neck supple.  Cardiovascular: Normal rate and regular rhythm.  Pulmonary/Chest: Effort normal and breath sounds normal. He has no wheezes.  Abdominal: Soft. Bowel sounds are normal. There is no hepatosplenomegaly. There is no tenderness.  Musculoskeletal: He exhibits no edema.  Lymphadenopathy:    He has no cervical adenopathy.  Neurological: He is alert and oriented to person, place, and time.  Skin: Skin is warm and dry.  Psychiatric: He has a normal mood and affect. His behavior is normal.  Vitals reviewed.          Assessment & Plan:    Encounter Diagnoses  Name Primary?  Marland Kitchen Uncontrolled type 2 diabetes mellitus with hyperglycemia (HCC) Yes  . Coronary artery disease involving native coronary artery of native heart with angina pectoris (HCC)   . Essential hypertension   . Hyperlipidemia, unspecified hyperlipidemia type   . Cigarette nicotine dependence with nicotine-induced disorder   . Anxiety with depression   . Gastroesophageal reflux disease, esophagitis presence not specified     -reviewed labs with pt.  A1C 13.0 -will incrase lantus to 35u qhs.  Pt counseled to check fbs and keep it on his bs log.  He is reminded to call office for fbs < 70 or > 300 -lipitor already increased to 80mg  by dr Diona Browner.  -pt to return to [facility].   -nurse called [facility] about meds ordered by dr Diona Browner and they are being sent -pt to follow up with bs log 1 month.  RTO sooner prn

## 2018-02-15 DIAGNOSIS — E785 Hyperlipidemia, unspecified: Secondary | ICD-10-CM | POA: Insufficient documentation

## 2018-02-15 DIAGNOSIS — I2510 Atherosclerotic heart disease of native coronary artery without angina pectoris: Secondary | ICD-10-CM | POA: Insufficient documentation

## 2018-02-15 DIAGNOSIS — Z720 Tobacco use: Secondary | ICD-10-CM | POA: Insufficient documentation

## 2018-02-15 NOTE — Progress Notes (Signed)
Cardiology Office Note    Date:  02/20/2018   ID:  [name] [name], DOB 05/05/1971, MRN [id number]  PCP:  [name], PA-C  Cardiologist: Nona Dell, MD EPS: None  Chief Complaint  Patient presents with  . Follow-up    History of Present Illness:  [name] [name] is a 47 y.o. male with history of moderate LAD disease on cath in 2016 by Dr. Jacinto Halim.    Was seen by by Dr. Diona Browner 01/12/2018 with recurrent chest pain, poorly controlled diabetes, HLD family history of premature CAD and ongoing tobacco abuse.Lopressor was increased to 37.5 mg twice daily Imdur 15 mg daily was added and Lipitor was increased to 80 mg daily.  Lexiscan Myoview 01/16/2018 low risk study, no ischemic zones LVEF 63%.  Complains of constant chest pressure/burning up and down his esophagus. Never goes away. Doesn't have teeth and when he swallows he has to spit it back up and cut it up more. Trying to eat steak without teeth and vomits it back up. Has dentures but can't get them to work. Multiple GI complaints including lower abdominal pain.  Has seen a GI doctor in [place] about 4 years ago.  Trying to get disability.  Continues to smoke a pack a day.  Past Medical History:  Diagnosis Date  . Anxiety   . CAD (coronary artery disease)    Moderate LAD disease 2016 - Dr. Jacinto Halim  . Depression   . GERD (gastroesophageal reflux disease)   . Hyperlipemia   . Hypertension   . Sleep apnea   . Type 2 diabetes mellitus (HCC)     Past Surgical History:  Procedure Laterality Date  . ANKLE SURGERY Bilateral 1982  . APPENDECTOMY  1975  . CARDIAC CATHETERIZATION N/A 03/28/2015   Procedure: Left Heart Cath and Coronary Angiography;  Surgeon: Yates Decamp, MD;  Location: [facility] INVASIVE CV LAB;  Service: Cardiovascular;  Laterality: N/A;  . CARDIAC CATHETERIZATION N/A 03/28/2015   Procedure: Intravascular Pressure Wire/FFR Study;  Surgeon: Yates Decamp, MD;  Location: [facility] INVASIVE CV LAB;  Service: Cardiovascular;   Laterality: N/A;  . CARPAL TUNNEL RELEASE Left   . COLONOSCOPY WITH ESOPHAGOGASTRODUODENOSCOPY (EGD)    . ELBOW FRACTURE SURGERY Left   . PILONIDAL CYST EXCISION N/A 03/24/2017   Procedure: EXCISION CHRONIC  PILONIDAL ABSCESS;  Surgeon: [name] Miyamoto, MD;  Location: [facility] ORS;  Service: General;  Laterality: N/A;    Current Medications: Current Meds  Medication Sig  . aspirin 81 MG tablet Take 81 mg by mouth daily.  Marland Kitchen atorvastatin (LIPITOR) 80 MG tablet Take 1 tablet (80 mg total) by mouth daily.  Marland Kitchen azelastine (OPTIVAR) 0.05 % ophthalmic solution 1 drop 2 (two) times daily.  . Calcium Carb-Cholecalciferol (CALCIUM 600/VITAMIN D3) 600-800 MG-UNIT TABS Take by mouth.  . hydrOXYzine (ATARAX/VISTARIL) 25 MG tablet Take 25 mg by mouth 3 (three) times daily as needed (takes 1 tab bid prn and1-2 tabs @@ hs prn).  . Insulin Glargine (LANTUS SOLOSTAR) 100 UNIT/ML Solostar Pen Inject 10 Units into the skin at bedtime. (Patient taking differently: Inject 35 Units into the skin at bedtime. )  . isosorbide mononitrate (IMDUR) 30 MG 24 hr tablet Take 0.5 tablets (15 mg total) by mouth daily.  . metFORMIN (GLUCOPHAGE) 1000 MG tablet TAKE 1 Tablet  BY MOUTH TWICE DAILY WITH MEALS  . nitroGLYCERIN (NITROSTAT) 0.4 MG SL tablet Place 1 tablet (0.4 mg total) under the tongue every 5 (five) minutes as needed for chest pain.  . [DISCONTINUED]  metoprolol tartrate (LOPRESSOR) 25 MG tablet Take 1.5 tablets (37.5 mg total) by mouth 2 (two) times daily. (Patient taking differently: Take 25 mg by mouth 2 (two) times daily. )  . [DISCONTINUED] metoprolol tartrate (LOPRESSOR) 25 MG tablet Take 25 mg by mouth 2 (two) times daily.     Allergies:   Omeprazole magnesium and Esomeprazole   Social History   Socioeconomic History  . Marital status: Single    Spouse name: Not on file  . Number of children: 0  . Years of education: 10th g  . Highest education level: Not on file  Occupational History  . Occupation:  [employer]  Social Needs  . Financial resource strain: Not on file  . Food insecurity:    Worry: Not on file    Inability: Not on file  . Transportation needs:    Medical: Not on file    Non-medical: Not on file  Tobacco Use  . Smoking status: Current Every Day Smoker    Packs/day: 0.50    Years: 32.00    Pack years: 16.00    Types: Cigarettes  . Smokeless tobacco: Never Used  Substance and Sexual Activity  . Alcohol use: No    Alcohol/week: 0.0 standard drinks  . Drug use: No  . Sexual activity: Not on file  Lifestyle  . Physical activity:    Days per week: Not on file    Minutes per session: Not on file  . Stress: Not on file  Relationships  . Social connections:    Talks on phone: Not on file    Gets together: Not on file    Attends religious service: Not on file    Active member of club or organization: Not on file    Attends meetings of clubs or organizations: Not on file    Relationship status: Not on file  Other Topics Concern  . Not on file  Social History Narrative  . Not on file     Family History:  The patient's family history includes Congestive Heart Failure in his sister; Diabetes in his father and mother; Hypertension in his father and mother.   ROS:   Please see the history of present illness.    Review of Systems  Constitution: Positive for malaise/fatigue.  Cardiovascular: Positive for chest pain and dyspnea on exertion.  Gastrointestinal: Positive for abdominal pain, dysphagia, heartburn, nausea and vomiting.  Neurological: Positive for weakness.  Psychiatric/Behavioral: The patient is nervous/anxious.    All other systems reviewed and are negative.   PHYSICAL EXAM:   VS:  BP 120/70 (BP Location: Right Arm)   Pulse 94   Ht 5\' 8"  (1.727 m)   Wt 170 lb (77.1 kg)   SpO2 97%   BMI 25.85 kg/m   Physical Exam  GEN: Well nourished, well developed, in no acute distress, smells of cigarettes Neck: no JVD, carotid bruits, or  masses Cardiac:RRR; no murmurs, rubs, or gallops  Respiratory: Decreased breath sounds throughout GI: soft, nontender, nondistended, + BS Ext: without cyanosis, clubbing, or edema, Good distal pulses bilaterally Neuro:  Alert and Oriented x 3 Psych: euthymic mood, full affect  Wt Readings from Last 3 Encounters:  02/20/18 170 lb (77.1 kg)  01/12/18 170 lb (77.1 kg)  01/10/18 165 lb (74.8 kg)      Studies/Labs Reviewed:   EKG:  EKG is not ordered today.  Recent Labs: 05/24/2017: TSH 1.772 05/27/2017: Magnesium 2.0 08/22/2017: Hemoglobin 14.4; Platelets 225 01/11/2018: ALT 16; BUN 19;  Creatinine, Ser 1.03; Potassium 4.9; Sodium 135   Lipid Panel    Component Value Date/Time   CHOL 272 (H) 01/11/2018 0844   TRIG 227 (H) 01/11/2018 0844   HDL 39 (L) 01/11/2018 0844   CHOLHDL 7.0 01/11/2018 0844   VLDL 45 (H) 01/11/2018 0844   LDLCALC 188 (H) 01/11/2018 0844    Additional studies/ records that were reviewed today include:  Nuclear stress test 01/16/2018  There was no ST segment deviation noted during stress.  Defect 1: There is a medium defect of mild severity present in the mid anteroseptal, mid inferoseptal, mid inferior, apical septal and apical inferior location. This is likely due to soft tissue attenuation.  This is a low risk study. No ischemic zones.  Nuclear stress EF: 63%.   Cardiac cath 16109. Normal LV systolic function, EF 55-60%. 2. Moderate coronary artery disease, proximal to midsegment of the LAD showing a diffuse 50% stenosis, there is rapid tapering of the LAD from the proximal segment to the midsegment and followed by a hazy 50% stenosis. Mid to distal LAD also had a 50% stenosis. FFR to this region was negative for hemodynamic significance. There is minimal disease in the circumflex and RCA. Codominant system.   ASSESSMENT:    1. Coronary artery disease involving native coronary artery of native heart without angina pectoris   2. Chest pain,  unspecified type   3. Essential hypertension   4. Diabetes mellitus due to underlying condition with hyperosmolarity without coma, without long-term current use of insulin (HCC)   5. Mixed hyperlipidemia   6. Tobacco abuse      PLAN:  In order of problems listed above:  CAD with nonobstructive disease on cardiac cath in 2016, recurrent chest pain with no ischemia on Lexiscan 01/16/2018 multiple CV risk factors that need to be controlled.  Chest pain is constant described as a burning up and down his esophagus.  Does not have dentures at work and has no teeth.  Eating foods that are difficult to swallow and vomits them back up.  Recommend he get his dentures fit properly and see GI.  Lexiscan Myoview 01/16/2018 was reassuring and I do not think this constant chest pain is coming from his heart.  Essential hypertension blood pressure stable but heart rate still 94 on metoprolol 37.5 mg twice daily.  Will increase to 50 mg twice daily.  Continue low-dose Imdur.  Diabetes mellitus with elevated hemoglobin A1c 13 01/11/2018  Hyperlipidemia LDL 188 triglycerides 227 total cholesterol 272 12/2017 .Lipitor increased to 80 mg daily  Tobacco abuse smoking cessation discussed.   Medication Adjustments/Labs and Tests Ordered: Current medicines are reviewed at length with the patient today.  Concerns regarding medicines are outlined above.  Medication changes, Labs and Tests ordered today are listed in the Patient Instructions below. Patient Instructions  Medication Instructions:  Your physician has recommended you make the following change in your medication:  Increase Lopressor to 50 mg Two Times a day    Labwork: NONE   Testing/Procedures: NONE   Follow-Up: Your physician recommends that you schedule a follow-up appointment with Dr. Diona Browner.    Any Other Special Instructions Will Be Listed Below (If Applicable).     If you need a refill on your cardiac medications before your next  appointment, please call your pharmacy.  Thank you for choosing [facility]!      Signed, Jacolyn Reedy, PA-C  02/20/2018 12:35 PM    [facility] [address], [place], [place]  [address] Phone: [phone]; Fax: [phone]

## 2018-02-20 ENCOUNTER — Encounter: Payer: Self-pay | Admitting: Physician Assistant

## 2018-02-20 ENCOUNTER — Ambulatory Visit (INDEPENDENT_AMBULATORY_CARE_PROVIDER_SITE_OTHER): Payer: Self-pay | Admitting: Physician Assistant

## 2018-02-20 VITALS — BP 120/70 | HR 94 | Ht 68.0 in | Wt 170.0 lb

## 2018-02-20 DIAGNOSIS — E782 Mixed hyperlipidemia: Secondary | ICD-10-CM

## 2018-02-20 DIAGNOSIS — E0800 Diabetes mellitus due to underlying condition with hyperosmolarity without nonketotic hyperglycemic-hyperosmolar coma (NKHHC): Secondary | ICD-10-CM

## 2018-02-20 DIAGNOSIS — R079 Chest pain, unspecified: Secondary | ICD-10-CM

## 2018-02-20 DIAGNOSIS — Z720 Tobacco use: Secondary | ICD-10-CM

## 2018-02-20 DIAGNOSIS — I2510 Atherosclerotic heart disease of native coronary artery without angina pectoris: Secondary | ICD-10-CM

## 2018-02-20 DIAGNOSIS — I10 Essential (primary) hypertension: Secondary | ICD-10-CM

## 2018-02-20 MED ORDER — METOPROLOL TARTRATE 50 MG PO TABS: 50.0000 mg | ORAL_TABLET | Freq: Two times a day (BID) | ORAL | 3 refills | Status: DC

## 2018-02-20 NOTE — Patient Instructions (Signed)
Medication Instructions:  Your physician has recommended you make the following change in your medication:  Increase Lopressor to 50 mg Two Times a day    Labwork: NONE   Testing/Procedures: NONE   Follow-Up: Your physician recommends that you schedule a follow-up appointment with Dr. Diona BrownerMcDowell.    Any Other Special Instructions Will Be Listed Below (If Applicable).     If you need a refill on your cardiac medications before your next appointment, please call your pharmacy.  Thank you for choosing [facility]!

## 2018-03-06 ENCOUNTER — Encounter: Payer: Self-pay | Admitting: Physician Assistant

## 2018-03-06 ENCOUNTER — Ambulatory Visit: Payer: Medicaid Other | Admitting: Physician Assistant

## 2018-03-06 VITALS — BP 153/96 | HR 101 | Temp 97.7°F | Ht 68.0 in | Wt 170.2 lb

## 2018-03-06 DIAGNOSIS — E1165 Type 2 diabetes mellitus with hyperglycemia: Secondary | ICD-10-CM

## 2018-03-06 DIAGNOSIS — I10 Essential (primary) hypertension: Secondary | ICD-10-CM

## 2018-03-06 DIAGNOSIS — F17200 Nicotine dependence, unspecified, uncomplicated: Secondary | ICD-10-CM

## 2018-03-06 DIAGNOSIS — K219 Gastro-esophageal reflux disease without esophagitis: Secondary | ICD-10-CM

## 2018-03-06 MED ORDER — FAMOTIDINE 20 MG PO TABS: 20.0000 mg | ORAL_TABLET | Freq: Two times a day (BID) | ORAL | 6 refills | Status: DC

## 2018-03-06 NOTE — Patient Instructions (Addendum)
Financial counselor [phone]  ---------------------------------------------------------------------   Gastroesophageal Reflux Disease, Adult Normally, food travels down the esophagus and stays in the stomach to be digested. If a person has gastroesophageal reflux disease (GERD), food and stomach acid move back up into the esophagus. When this happens, the esophagus becomes sore and swollen (inflamed). Over time, GERD can make small holes (ulcers) in the lining of the esophagus. Follow these instructions at home: Diet  Follow a diet as told by your doctor. You may need to avoid foods and drinks such as: ? Coffee and tea (with or without caffeine). ? Drinks that contain alcohol. ? Energy drinks and sports drinks. ? Carbonated drinks or sodas. ? Chocolate and cocoa. ? Peppermint and mint flavorings. ? Garlic and onions. ? Horseradish. ? Spicy and acidic foods, such as peppers, chili powder, curry powder, vinegar, hot sauces, and BBQ sauce. ? Citrus fruit juices and citrus fruits, such as oranges, lemons, and limes. ? Tomato-based foods, such as red sauce, chili, salsa, and pizza with red sauce. ? Fried and fatty foods, such as donuts, french fries, potato chips, and high-fat dressings. ? High-fat meats, such as hot dogs, rib eye steak, sausage, ham, and bacon. ? High-fat dairy items, such as whole milk, butter, and cream cheese.  Eat small meals often. Avoid eating large meals.  Avoid drinking large amounts of liquid with your meals.  Avoid eating meals during the 2-3 hours before bedtime.  Avoid lying down right after you eat.  Do not exercise right after you eat. General instructions  Pay attention to any changes in your symptoms.  Take over-the-counter and prescription medicines only as told by your doctor. Do not take aspirin, ibuprofen, or other NSAIDs unless your doctor says it is okay.  Do not use any tobacco products, including cigarettes, chewing tobacco, and  e-cigarettes. If you need help quitting, ask your doctor.  Wear loose clothes. Do not wear anything tight around your waist.  Raise (elevate) the head of your bed about 6 inches (15 cm).  Try to lower your stress. If you need help doing this, ask your doctor.  If you are overweight, lose an amount of weight that is healthy for you. Ask your doctor about a safe weight loss goal.  Keep all follow-up visits as told by your doctor. This is important. Contact a doctor if:  You have new symptoms.  You lose weight and you do not know why it is happening.  You have trouble swallowing, or it hurts to swallow.  You have wheezing or a cough that keeps happening.  Your symptoms do not get better with treatment.  You have a hoarse voice. Get help right away if:  You have pain in your arms, neck, jaw, teeth, or back.  You feel sweaty, dizzy, or light-headed.  You have chest pain or shortness of breath.  You throw up (vomit) and your throw up looks like blood or coffee grounds.  You pass out (faint).  Your poop (stool) is bloody or black.  You cannot swallow, drink, or eat. This information is not intended to replace advice given to you by your health care provider. Make sure you discuss any questions you have with your health care provider. Document Released: 11/03/2007 Document Revised: 10/23/2015 Document Reviewed: 09/11/2014 Elsevier Interactive Patient Education  [employer].

## 2018-03-06 NOTE — Progress Notes (Signed)
BP (!) 153/96 (BP Location: Right Arm, Patient Position: Sitting, Cuff Size: Normal)   Pulse (!) 101   Temp 97.7 F (36.5 C)   Ht 5\' 8"  (1.727 m)   Wt 170 lb 4 oz (77.2 kg)   SpO2 99%   BMI 25.89 kg/m    Subjective:    Patient ID: [name] [name], male    DOB: 06-11-1970, 47 y.o.   MRN: [id number]  HPI: [name] [name] is a 47 y.o. male presenting on 03/06/2018 for Diabetes   HPI   Pt says he is watching his diabetic diet and is monitoring his bs.  Reviewed bs log- range 140-225  Pt complaints of reflux  Relevant past medical, surgical, family and social history reviewed and updated as indicated. Interim medical history since our last visit reviewed. Allergies and medications reviewed and updated.   Current Outpatient Medications:  .  aspirin 81 MG tablet, Take 81 mg by mouth daily., Disp: , Rfl:  .  atorvastatin (LIPITOR) 80 MG tablet, Take 1 tablet (80 mg total) by mouth daily., Disp: 90 tablet, Rfl: 3 .  azelastine (OPTIVAR) 0.05 % ophthalmic solution, 1 drop 2 (two) times daily., Disp: , Rfl:  .  Calcium Carb-Cholecalciferol (CALCIUM 600/VITAMIN D3) 600-800 MG-UNIT TABS, Take by mouth., Disp: , Rfl:  .  hydrOXYzine (ATARAX/VISTARIL) 25 MG tablet, Take 25 mg by mouth 3 (three) times daily as needed (takes 1 tab bid prn and1-2 tabs @@ hs prn)., Disp: , Rfl:  .  Insulin Glargine (LANTUS SOLOSTAR) 100 UNIT/ML Solostar Pen, Inject 10 Units into the skin at bedtime. (Patient taking differently: Inject 35 Units into the skin at bedtime. ), Disp: 1 pen, Rfl: 0 .  isosorbide mononitrate (IMDUR) 30 MG 24 hr tablet, Take 0.5 tablets (15 mg total) by mouth daily., Disp: 45 tablet, Rfl: 3 .  metFORMIN (GLUCOPHAGE) 1000 MG tablet, TAKE 1 Tablet  BY MOUTH TWICE DAILY WITH MEALS, Disp: 180 tablet, Rfl: 0 .  metoprolol tartrate (LOPRESSOR) 25 MG tablet, Take 37.5 mg by mouth 2 (two) times daily. Take 1 and 1/2 tablets by mouth twice daily, Disp: , Rfl:  .  nitroGLYCERIN (NITROSTAT) 0.4 MG  SL tablet, Place 1 tablet (0.4 mg total) under the tongue every 5 (five) minutes as needed for chest pain., Disp: 25 tablet, Rfl: 3 .  metoprolol tartrate (LOPRESSOR) 50 MG tablet, Take 1 tablet (50 mg total) by mouth 2 (two) times daily. (Patient not taking: Reported on 03/06/2018), Disp: 180 tablet, Rfl: 3   Review of Systems  Constitutional: Positive for appetite change, chills, diaphoresis, fatigue and unexpected weight change. Negative for fever.  HENT: Positive for congestion, drooling, ear pain, sore throat and trouble swallowing. Negative for dental problem, facial swelling, hearing loss, mouth sores, sneezing and voice change.   Eyes: Positive for pain, discharge, redness, itching and visual disturbance.  Respiratory: Positive for cough, choking, shortness of breath and wheezing.   Cardiovascular: Positive for chest pain and leg swelling. Negative for palpitations.  Gastrointestinal: Positive for abdominal pain, constipation and vomiting. Negative for blood in stool and diarrhea.  Endocrine: Positive for polydipsia. Negative for cold intolerance and heat intolerance.  Genitourinary: Positive for decreased urine volume. Negative for dysuria and hematuria.  Musculoskeletal: Positive for arthralgias, back pain and gait problem.  Skin: Negative for rash.  Allergic/Immunologic: Positive for environmental allergies.  Neurological: Positive for light-headedness and headaches. Negative for seizures and syncope.  Hematological: Negative for adenopathy.  Psychiatric/Behavioral: Positive for agitation and dysphoric  mood. Negative for suicidal ideas. The patient is nervous/anxious.     Per HPI unless specifically indicated above     Objective:    BP (!) 153/96 (BP Location: Right Arm, Patient Position: Sitting, Cuff Size: Normal)   Pulse (!) 101   Temp 97.7 F (36.5 C)   Ht 5\' 8"  (1.727 m)   Wt 170 lb 4 oz (77.2 kg)   SpO2 99%   BMI 25.89 kg/m   Wt Readings from Last 3 Encounters:   03/06/18 170 lb 4 oz (77.2 kg)  02/20/18 170 lb (77.1 kg)  01/12/18 170 lb (77.1 kg)    Physical Exam  Constitutional: He is oriented to person, place, and time. He appears well-developed and well-nourished.  HENT:  Head: Normocephalic and atraumatic.  Neck: Neck supple.  Cardiovascular: Normal rate and regular rhythm.  Pulmonary/Chest: Effort normal and breath sounds normal. He has no wheezes.  Abdominal: Soft. Bowel sounds are normal. There is no hepatosplenomegaly. There is no tenderness.  Musculoskeletal: He exhibits no edema.  Lymphadenopathy:    He has no cervical adenopathy.  Neurological: He is alert and oriented to person, place, and time.  Skin: Skin is warm and dry.  Psychiatric: He has a normal mood and affect. His behavior is normal.  Vitals reviewed.       Assessment & Plan:   Encounter Diagnoses  Name Primary?  Marland Kitchen Uncontrolled type 2 diabetes mellitus with hyperglycemia (HCC) Yes  . Gastroesophageal reflux disease, esophagitis presence not specified   . Tobacco use disorder     -Refer for diabetic eye exam -Increase lantus to 40u qhs.  Watch diet and monitor bs.  Pt reminded to call for fbs < 70 or > 300 -Unable to rx omeprazole due to allergy (face swelling).  rx pepcid.  Counseled pt on lifestyle changes including smoking cessation -no changes to BP meds today as bp was good at cardiology appointment on 9/23 and at last appointment here on 9/12

## 2018-03-16 ENCOUNTER — Inpatient Hospital Stay (HOSPITAL_COMMUNITY)
Admission: EM | Admit: 2018-03-16 | Discharge: 2018-03-19 | DRG: 639 | Disposition: A | Discharge status: Home / Self Care | Payer: Self-pay | Attending: Internal Medicine | Admitting: Internal Medicine

## 2018-03-16 ENCOUNTER — Emergency Department (HOSPITAL_COMMUNITY): Payer: Medicaid Other

## 2018-03-16 ENCOUNTER — Other Ambulatory Visit: Payer: Self-pay

## 2018-03-16 ENCOUNTER — Encounter (HOSPITAL_COMMUNITY): Payer: Self-pay | Admitting: Emergency Medicine

## 2018-03-16 DIAGNOSIS — R112 Nausea with vomiting, unspecified: Secondary | ICD-10-CM

## 2018-03-16 DIAGNOSIS — R109 Unspecified abdominal pain: Secondary | ICD-10-CM

## 2018-03-16 DIAGNOSIS — G4733 Obstructive sleep apnea (adult) (pediatric): Secondary | ICD-10-CM | POA: Diagnosis present

## 2018-03-16 DIAGNOSIS — Z8249 Family history of ischemic heart disease and other diseases of the circulatory system: Secondary | ICD-10-CM

## 2018-03-16 DIAGNOSIS — K3184 Gastroparesis: Secondary | ICD-10-CM | POA: Diagnosis present

## 2018-03-16 DIAGNOSIS — E860 Dehydration: Secondary | ICD-10-CM | POA: Diagnosis present

## 2018-03-16 DIAGNOSIS — R1011 Right upper quadrant pain: Secondary | ICD-10-CM

## 2018-03-16 DIAGNOSIS — Z9119 Patient's noncompliance with other medical treatment and regimen: Secondary | ICD-10-CM

## 2018-03-16 DIAGNOSIS — E869 Volume depletion, unspecified: Secondary | ICD-10-CM

## 2018-03-16 DIAGNOSIS — E1143 Type 2 diabetes mellitus with diabetic autonomic (poly)neuropathy: Secondary | ICD-10-CM | POA: Diagnosis present

## 2018-03-16 DIAGNOSIS — E1110 Type 2 diabetes mellitus with ketoacidosis without coma: Principal | ICD-10-CM | POA: Diagnosis present

## 2018-03-16 DIAGNOSIS — E876 Hypokalemia: Secondary | ICD-10-CM | POA: Diagnosis present

## 2018-03-16 DIAGNOSIS — I2510 Atherosclerotic heart disease of native coronary artery without angina pectoris: Secondary | ICD-10-CM | POA: Diagnosis present

## 2018-03-16 DIAGNOSIS — Z888 Allergy status to other drugs, medicaments and biological substances status: Secondary | ICD-10-CM

## 2018-03-16 DIAGNOSIS — F17210 Nicotine dependence, cigarettes, uncomplicated: Secondary | ICD-10-CM | POA: Diagnosis present

## 2018-03-16 DIAGNOSIS — E785 Hyperlipidemia, unspecified: Secondary | ICD-10-CM | POA: Diagnosis present

## 2018-03-16 DIAGNOSIS — F329 Major depressive disorder, single episode, unspecified: Secondary | ICD-10-CM | POA: Diagnosis present

## 2018-03-16 DIAGNOSIS — K219 Gastro-esophageal reflux disease without esophagitis: Secondary | ICD-10-CM | POA: Diagnosis present

## 2018-03-16 DIAGNOSIS — F419 Anxiety disorder, unspecified: Secondary | ICD-10-CM | POA: Diagnosis present

## 2018-03-16 DIAGNOSIS — I10 Essential (primary) hypertension: Secondary | ICD-10-CM | POA: Diagnosis present

## 2018-03-16 DIAGNOSIS — Z7984 Long term (current) use of oral hypoglycemic drugs: Secondary | ICD-10-CM

## 2018-03-16 HISTORY — DX: Type 2 diabetes mellitus with diabetic polyneuropathy: E11.42

## 2018-03-16 HISTORY — DX: Chronic obstructive pulmonary disease, unspecified: J44.9

## 2018-03-16 HISTORY — DX: Other chronic pain: G89.29

## 2018-03-16 HISTORY — DX: Unspecified chronic bronchitis: J42

## 2018-03-16 HISTORY — DX: Unspecified osteoarthritis, unspecified site: M19.90

## 2018-03-16 HISTORY — DX: Tinnitus, bilateral: H93.13

## 2018-03-16 HISTORY — DX: Migraine, unspecified, not intractable, without status migrainosus: G43.909

## 2018-03-16 HISTORY — DX: Dorsalgia, unspecified: M54.9

## 2018-03-16 HISTORY — DX: Personal history of other diseases of the musculoskeletal system and connective tissue: Z87.39

## 2018-03-16 LAB — CBC
HEMATOCRIT: 46.5 % (ref 39.0–52.0)
HEMOGLOBIN: 16.5 g/dL (ref 13.0–17.0)
MCH: 30.4 pg (ref 26.0–34.0)
MCHC: 35.5 g/dL (ref 30.0–36.0)
MCV: 85.6 fL (ref 80.0–100.0)
NRBC: 0 % (ref 0.0–0.2)
Platelets: 272 10*3/uL (ref 150–400)
RBC: 5.43 MIL/uL (ref 4.22–5.81)
RDW: 11.6 % (ref 11.5–15.5)
WBC: 16.9 10*3/uL — AB (ref 4.0–10.5)

## 2018-03-16 LAB — I-STAT VENOUS BLOOD GAS, ED
ACID-BASE DEFICIT: 1 mmol/L (ref 0.0–2.0)
BICARBONATE: 20.1 mmol/L (ref 20.0–28.0)
O2 Saturation: 74 %
PH VEN: 7.527 — AB (ref 7.250–7.430)
TCO2: 21 mmol/L — ABNORMAL LOW (ref 22–32)
pCO2, Ven: 24.2 mmHg — ABNORMAL LOW (ref 44.0–60.0)
pO2, Ven: 34 mmHg (ref 32.0–45.0)

## 2018-03-16 LAB — I-STAT ARTERIAL BLOOD GAS, ED
Acid-base deficit: 4 mmol/L — ABNORMAL HIGH (ref 0.0–2.0)
BICARBONATE: 21.1 mmol/L (ref 20.0–28.0)
O2 Saturation: 94 %
PH ART: 7.339 — AB (ref 7.350–7.450)
TCO2: 22 mmol/L (ref 22–32)
pCO2 arterial: 39.2 mmHg (ref 32.0–48.0)
pO2, Arterial: 77 mmHg — ABNORMAL LOW (ref 83.0–108.0)

## 2018-03-16 LAB — RAPID URINE DRUG SCREEN, HOSP PERFORMED
Amphetamines: NOT DETECTED
BARBITURATES: NOT DETECTED
Benzodiazepines: NOT DETECTED
Cocaine: NOT DETECTED
Opiates: NOT DETECTED
TETRAHYDROCANNABINOL: NOT DETECTED

## 2018-03-16 LAB — BASIC METABOLIC PANEL
Anion gap: 13 (ref 5–15)
Anion gap: 11 (ref 5–15)
Anion gap: 11 (ref 5–15)
Anion gap: 13 (ref 5–15)
BUN: 8 mg/dL (ref 6–20)
BUN: 8 mg/dL (ref 6–20)
BUN: 6 mg/dL (ref 6–20)
BUN: 7 mg/dL (ref 6–20)
CO2: 23 mmol/L (ref 22–32)
CO2: 25 mmol/L (ref 22–32)
CO2: 22 mmol/L (ref 22–32)
CO2: 20 mmol/L — ABNORMAL LOW (ref 22–32)
Calcium: 8.7 mg/dL — ABNORMAL LOW (ref 8.9–10.3)
Calcium: 8.5 mg/dL — ABNORMAL LOW (ref 8.9–10.3)
Calcium: 8.3 mg/dL — ABNORMAL LOW (ref 8.9–10.3)
Calcium: 8.3 mg/dL — ABNORMAL LOW (ref 8.9–10.3)
Chloride: 103 mmol/L (ref 98–111)
Chloride: 106 mmol/L (ref 98–111)
Chloride: 107 mmol/L (ref 98–111)
Chloride: 108 mmol/L (ref 98–111)
Creatinine, Ser: 1.06 mg/dL (ref 0.61–1.24)
Creatinine, Ser: 0.95 mg/dL (ref 0.61–1.24)
Creatinine, Ser: 0.82 mg/dL (ref 0.61–1.24)
Creatinine, Ser: 0.93 mg/dL (ref 0.61–1.24)
GFR calc Af Amer: >60 mL/min (ref >60)
GFR calc Af Amer: >60 mL/min (ref >60)
GFR calc Af Amer: >60 mL/min (ref >60)
GFR calc Af Amer: >60 mL/min (ref >60)
GFR calc non Af Amer: >60 mL/min (ref >60)
GFR calc non Af Amer: >60 mL/min (ref >60)
GFR calc non Af Amer: >60 mL/min (ref >60)
GFR calc non Af Amer: >60 mL/min (ref >60)
Glucose, Bld: 283 mg/dL — ABNORMAL HIGH (ref 70–99)
Glucose, Bld: 147 mg/dL — ABNORMAL HIGH (ref 70–99)
Glucose, Bld: 130 mg/dL — ABNORMAL HIGH (ref 70–99)
Glucose, Bld: 182 mg/dL — ABNORMAL HIGH (ref 70–99)
Potassium: 3.5 mmol/L (ref 3.5–5.1)
Potassium: 4 mmol/L (ref 3.5–5.1)
Potassium: 3.8 mmol/L (ref 3.5–5.1)
Potassium: 3.3 mmol/L — ABNORMAL LOW (ref 3.5–5.1)
Sodium: 139 mmol/L (ref 135–145)
Sodium: 142 mmol/L (ref 135–145)
Sodium: 140 mmol/L (ref 135–145)
Sodium: 141 mmol/L (ref 135–145)

## 2018-03-16 LAB — COMPREHENSIVE METABOLIC PANEL
ALT: 15 U/L (ref 0–44)
AST: 16 U/L (ref 15–41)
Albumin: 3.8 g/dL (ref 3.5–5.0)
Alkaline Phosphatase: 123 U/L (ref 38–126)
Anion gap: 19 — ABNORMAL HIGH (ref 5–15)
BUN: 11 mg/dL (ref 6–20)
CHLORIDE: 96 mmol/L — AB (ref 98–111)
CO2: 18 mmol/L — AB (ref 22–32)
Calcium: 9.6 mg/dL (ref 8.9–10.3)
Creatinine, Ser: 1.22 mg/dL (ref 0.61–1.24)
Glucose, Bld: 540 mg/dL (ref 70–99)
POTASSIUM: 3.9 mmol/L (ref 3.5–5.1)
Sodium: 133 mmol/L — ABNORMAL LOW (ref 135–145)
Total Bilirubin: 1.2 mg/dL (ref 0.3–1.2)
Total Protein: 7.3 g/dL (ref 6.5–8.1)

## 2018-03-16 LAB — CBG MONITORING, ED
GLUCOSE-CAPILLARY: 511 mg/dL — AB (ref 70–99)
GLUCOSE-CAPILLARY: 459 mg/dL — AB (ref 70–99)
GLUCOSE-CAPILLARY: 290 mg/dL — AB (ref 70–99)
GLUCOSE-CAPILLARY: 291 mg/dL — AB (ref 70–99)
GLUCOSE-CAPILLARY: 205 mg/dL — AB (ref 70–99)
GLUCOSE-CAPILLARY: 168 mg/dL — AB (ref 70–99)
Glucose-Capillary: >600 mg/dL (ref 70–99)
Glucose-Capillary: 190 mg/dL — ABNORMAL HIGH (ref 70–99)

## 2018-03-16 LAB — URINALYSIS, ROUTINE W REFLEX MICROSCOPIC
BACTERIA UA: NONE SEEN
Bilirubin Urine: NEGATIVE
KETONES UR: 20 mg/dL — AB
LEUKOCYTES UA: NEGATIVE
NITRITE: NEGATIVE
Protein, ur: NEGATIVE mg/dL
Specific Gravity, Urine: 1.015 (ref 1.005–1.030)
pH: 7 (ref 5.0–8.0)

## 2018-03-16 LAB — GLUCOSE, CAPILLARY
Glucose-Capillary: 141 mg/dL — ABNORMAL HIGH (ref 70–99)
Glucose-Capillary: 151 mg/dL — ABNORMAL HIGH (ref 70–99)
Glucose-Capillary: 120 mg/dL — ABNORMAL HIGH (ref 70–99)
Glucose-Capillary: 221 mg/dL — ABNORMAL HIGH (ref 70–99)
Glucose-Capillary: 79 mg/dL (ref 70–99)
Glucose-Capillary: 115 mg/dL — ABNORMAL HIGH (ref 70–99)
Glucose-Capillary: 145 mg/dL — ABNORMAL HIGH (ref 70–99)
Glucose-Capillary: 242 mg/dL — ABNORMAL HIGH (ref 70–99)

## 2018-03-16 LAB — ACETAMINOPHEN LEVEL

## 2018-03-16 LAB — PHOSPHORUS
Phosphorus: 3.9 mg/dL (ref 2.5–4.6)
Phosphorus: 3.1 mg/dL (ref 2.5–4.6)
Phosphorus: 3.2 mg/dL (ref 2.5–4.6)
Phosphorus: 3.1 mg/dL (ref 2.5–4.6)

## 2018-03-16 LAB — LIPASE, BLOOD: LIPASE: 26 U/L (ref 11–51)

## 2018-03-16 LAB — ETHANOL: Alcohol, Ethyl (B): <10 mg/dL (ref <10)

## 2018-03-16 LAB — MAGNESIUM
Magnesium: 1.7 mg/dL (ref 1.7–2.4)
Magnesium: 1.7 mg/dL (ref 1.7–2.4)
Magnesium: 1.7 mg/dL (ref 1.7–2.4)
Magnesium: 1.7 mg/dL (ref 1.7–2.4)

## 2018-03-16 LAB — MRSA PCR SCREENING: MRSA by PCR: NEGATIVE

## 2018-03-16 LAB — SALICYLATE LEVEL: Salicylate Lvl: <7 mg/dL (ref 2.8–30.0)

## 2018-03-16 LAB — POC OCCULT BLOOD, ED: FECAL OCCULT BLD: NEGATIVE

## 2018-03-16 MED ORDER — LORAZEPAM 2 MG/ML IJ SOLN
1.0000 mg | Freq: Once | INTRAMUSCULAR | Status: AC
  Administered 2018-03-16: 1 mg via INTRAVENOUS
  Filled 2018-03-16: qty 1

## 2018-03-16 MED ORDER — SODIUM CHLORIDE 0.9 % IV BOLUS
1000.0000 mL | Freq: Once | INTRAVENOUS | Status: AC
  Administered 2018-03-16: 1000 mL via INTRAVENOUS

## 2018-03-16 MED ORDER — ASPIRIN EC 81 MG PO TBEC
81.0000 mg | DELAYED_RELEASE_TABLET | Freq: Every day | ORAL | Status: DC
  Administered 2018-03-16 – 2018-03-19 (×4): 81 mg via ORAL
  Filled 2018-03-16 (×4): qty 1

## 2018-03-16 MED ORDER — ENSURE ENLIVE PO LIQD
237.0000 mL | Freq: Two times a day (BID) | ORAL | Status: DC
  Administered 2018-03-17: 237 mL via ORAL

## 2018-03-16 MED ORDER — DEXTROSE-NACL 5-0.45 % IV SOLN: INTRAVENOUS | Status: DC

## 2018-03-16 MED ORDER — INSULIN GLARGINE 100 UNIT/ML ~~LOC~~ SOLN
15.0000 [IU] | Freq: Two times a day (BID) | SUBCUTANEOUS | Status: DC
  Administered 2018-03-16 – 2018-03-19 (×6): 15 [IU] via SUBCUTANEOUS
  Filled 2018-03-16 (×6): qty 0.15

## 2018-03-16 MED ORDER — INSULIN ASPART 100 UNIT/ML ~~LOC~~ SOLN
0.0000 [IU] | Freq: Three times a day (TID) | SUBCUTANEOUS | Status: DC
  Administered 2018-03-17: 1 [IU] via SUBCUTANEOUS
  Administered 2018-03-17 – 2018-03-18 (×4): 3 [IU] via SUBCUTANEOUS
  Administered 2018-03-19: 1 [IU] via SUBCUTANEOUS

## 2018-03-16 MED ORDER — INSULIN REGULAR(HUMAN) IN NACL 100-0.9 UT/100ML-% IV SOLN: INTRAVENOUS | Status: DC

## 2018-03-16 MED ORDER — INSULIN GLARGINE 100 UNIT/ML ~~LOC~~ SOLN
15.0000 [IU] | Freq: Two times a day (BID) | SUBCUTANEOUS | Status: DC
  Filled 2018-03-16: qty 0.15

## 2018-03-16 MED ORDER — FAMOTIDINE IN NACL 20-0.9 MG/50ML-% IV SOLN
20.0000 mg | Freq: Once | INTRAVENOUS | Status: AC
  Administered 2018-03-16: 20 mg via INTRAVENOUS
  Filled 2018-03-16: qty 50

## 2018-03-16 MED ORDER — NITROGLYCERIN 0.4 MG SL SUBL: 0.4000 mg | SUBLINGUAL_TABLET | SUBLINGUAL | Status: DC | PRN

## 2018-03-16 MED ORDER — METOPROLOL TARTRATE 50 MG PO TABS
50.0000 mg | ORAL_TABLET | Freq: Two times a day (BID) | ORAL | Status: DC
  Administered 2018-03-16 – 2018-03-19 (×7): 50 mg via ORAL
  Filled 2018-03-16 (×7): qty 1

## 2018-03-16 MED ORDER — ATORVASTATIN CALCIUM 80 MG PO TABS
80.0000 mg | ORAL_TABLET | Freq: Every day | ORAL | Status: DC
  Administered 2018-03-16 – 2018-03-18 (×3): 80 mg via ORAL
  Filled 2018-03-16 (×3): qty 1

## 2018-03-16 MED ORDER — POTASSIUM CHLORIDE 10 MEQ/100ML IV SOLN
10.0000 meq | INTRAVENOUS | Status: AC
  Administered 2018-03-16 (×4): 10 meq via INTRAVENOUS
  Filled 2018-03-16 (×4): qty 100

## 2018-03-16 MED ORDER — INSULIN REGULAR(HUMAN) IN NACL 100-0.9 UT/100ML-% IV SOLN
INTRAVENOUS | Status: DC
  Administered 2018-03-16: 1.1 [IU]/h via INTRAVENOUS
  Administered 2018-03-16: 4.5 [IU]/h via INTRAVENOUS
  Filled 2018-03-16: qty 100

## 2018-03-16 MED ORDER — SODIUM CHLORIDE 0.9 % IV SOLN
INTRAVENOUS | Status: AC
  Administered 2018-03-16: 1000 mL via INTRAVENOUS

## 2018-03-16 MED ORDER — INFLUENZA VAC SPLIT QUAD 0.5 ML IM SUSY
0.5000 mL | PREFILLED_SYRINGE | INTRAMUSCULAR | Status: DC
  Filled 2018-03-16: qty 0.5

## 2018-03-16 MED ORDER — INSULIN GLARGINE 100 UNIT/ML ~~LOC~~ SOLN
15.0000 [IU] | SUBCUTANEOUS | Status: DC
  Administered 2018-03-16: 15 [IU] via SUBCUTANEOUS

## 2018-03-16 MED ORDER — INSULIN ASPART 100 UNIT/ML ~~LOC~~ SOLN: 0.0000 [IU] | Freq: Every day | SUBCUTANEOUS | Status: DC

## 2018-03-16 MED ORDER — SODIUM CHLORIDE 0.9 % IV SOLN: INTRAVENOUS | Status: DC

## 2018-03-16 MED ORDER — SODIUM CHLORIDE 0.9 % IV SOLN
INTRAVENOUS | Status: DC
  Administered 2018-03-16 – 2018-03-18 (×3): via INTRAVENOUS

## 2018-03-16 MED ORDER — ISOSORBIDE MONONITRATE ER 30 MG PO TB24
15.0000 mg | ORAL_TABLET | Freq: Every day | ORAL | Status: DC
  Administered 2018-03-16 – 2018-03-19 (×4): 15 mg via ORAL
  Filled 2018-03-16 (×4): qty 1

## 2018-03-16 MED ORDER — DEXTROSE-NACL 5-0.45 % IV SOLN
INTRAVENOUS | Status: DC
  Administered 2018-03-16: 1000 mL via INTRAVENOUS

## 2018-03-16 NOTE — Progress Notes (Signed)
[name] [name] is a 47 y.o. male patient admitted from ED awake, alert - oriented  X 4 - no acute distress noted.  VSS - Blood pressure (!) 161/97, pulse (!) 107, temperature 98.4 F (36.9 C), temperature source Oral, resp. rate (!) 24, SpO2 95 %.    IV in place, occlusive dsg intact without redness.  Orientation to room, and floor completed with information packet given to patient/family.  Patient declined safety video at this time.  Admission INP armband ID verified with patient/family, and in place.   SR up x 2, fall assessment complete, with patient and family able to verbalize understanding of risk associated with falls, and verbalized understanding to call nsg before up out of bed.  Call light within reach, patient able to voice, and demonstrate understanding.  Skin, clean-dry- intact without evidence of bruising, or skin tears.   No evidence of skin break down noted on exam.     Will cont to eval and treat per MD orders.  Eligah East, RN 03/16/2018 1:33 PM

## 2018-03-16 NOTE — ED Provider Notes (Signed)
[facility] EMERGENCY DEPARTMENT Provider Note   CSN: [id number] Arrival date & time: 03/16/18  4098     History   Chief Complaint Chief Complaint  Patient presents with  . Abdominal Pain    HPI [name] [name] is a 47 y.o. male with a h/o of poorly controlled diabetes mellitus type 2, diabetic gastroparesis GERD, CAD, depression, HTN, and HLD who presents to the emergency department with a chief complaint of vomiting and abdominal pain.  The patient endorses sudden onset generalized abdominal pain and vomiting again began last night.  He states that he had several episodes of hematemesis, last episode was this morning.  No history of similar.  He also endorses multiple episodes of dark black stool over the last few days.  He also reports fever, chills, shortness of breath that began around the time his abdominal pain and vomiting began.  He also reports he has had intermittent headaches over the last few days and tinnitus. He denies diarrhea, urinary frequency or hesitancy, dysuria, dizziness, or rash.  No treatment prior to arrival.  He reports he checked his blood sugar yesterday morning and it was 240.  He denies missing any doses of his home anti-glycemic medications. Reports he only ate a salad yesterday.  Denies taking Goody powders.  States he takes a baby daily aspirin.  He also reports taking a couple of capfuls of Pepto-Bismol over the last few days.  The history is provided by the patient. No language interpreter was used.    Past Medical History:  Diagnosis Date  . Anxiety   . CAD (coronary artery disease)    Moderate LAD disease 2016 - Dr. Jacinto Halim  . Depression   . GERD (gastroesophageal reflux disease)   . Hyperlipemia   . Hypertension   . Sleep apnea   . Type 2 diabetes mellitus [facility])     Patient Active Problem List   Diagnosis Date Noted  . CAD (coronary artery disease) 02/15/2018  . Diabetes mellitus (HCC) 02/15/2018  . Hyperlipidemia  02/15/2018  . Tobacco abuse 02/15/2018  . Diabetic gastroparesis (HCC) 06/02/2017  . ARF (acute renal failure) (HCC) 05/30/2017  . DKA (diabetic ketoacidoses) (HCC) 10/15/2016  . Gastroesophageal reflux disease 10/15/2016  . Anxiety with depression 10/15/2016  . HTN (hypertension) 10/15/2016  . Intractable nausea and vomiting 11/25/2015  . Dental abscess 11/25/2015  . Nausea and vomiting 11/25/2015  . Chest pain 03/27/2015    Past Surgical History:  Procedure Laterality Date  . ANKLE SURGERY Bilateral 1982  . APPENDECTOMY  1975  . CARDIAC CATHETERIZATION N/A 03/28/2015   Procedure: Left Heart Cath and Coronary Angiography;  Surgeon: Yates Decamp, MD;  Location: [facility] INVASIVE CV LAB;  Service: Cardiovascular;  Laterality: N/A;  . CARDIAC CATHETERIZATION N/A 03/28/2015   Procedure: Intravascular Pressure Wire/FFR Study;  Surgeon: Yates Decamp, MD;  Location: [facility] INVASIVE CV LAB;  Service: Cardiovascular;  Laterality: N/A;  . CARPAL TUNNEL RELEASE Left   . COLONOSCOPY WITH ESOPHAGOGASTRODUODENOSCOPY (EGD)    . ELBOW FRACTURE SURGERY Left   . PILONIDAL CYST EXCISION N/A 03/24/2017   Procedure: EXCISION CHRONIC  PILONIDAL ABSCESS;  Surgeon: [name] Miyamoto, MD;  Location: [facility] ORS;  Service: General;  Laterality: N/A;        Home Medications    Prior to Admission medications   Medication Sig Start Date End Date Taking? Authorizing Provider  aspirin 81 MG tablet Take 81 mg by mouth daily.   Yes [provider]  atorvastatin (LIPITOR) 80  MG tablet Take 1 tablet (80 mg total) by mouth daily. 01/12/18  Yes Jonelle Sidle, MD  famotidine (PEPCID) 20 MG tablet Take 1 tablet (20 mg total) by mouth 2 (two) times daily. 03/06/18  Yes [name], PA-C  hydrOXYzine (ATARAX/VISTARIL) 25 MG tablet Take 25 mg by mouth 3 (three) times daily as needed for anxiety.    Yes [provider]  Insulin Glargine (LANTUS SOLOSTAR) 100 UNIT/ML Solostar Pen Inject 10 Units into the skin at  bedtime. Patient taking differently: Inject 40 Units into the skin at bedtime.  09/05/17  Yes [name], PA-C  isosorbide mononitrate (IMDUR) 30 MG 24 hr tablet Take 0.5 tablets (15 mg total) by mouth daily. 01/12/18 04/12/18 Yes Jonelle Sidle, MD  metFORMIN (GLUCOPHAGE) 1000 MG tablet TAKE 1 Tablet  BY MOUTH TWICE DAILY WITH MEALS 09/20/17  Yes [name], PA-C  metoprolol tartrate (LOPRESSOR) 50 MG tablet Take 1 tablet (50 mg total) by mouth 2 (two) times daily. 02/20/18 05/21/18 Yes Dyann Kief, PA-C  nitroGLYCERIN (NITROSTAT) 0.4 MG SL tablet Place 1 tablet (0.4 mg total) under the tongue every 5 (five) minutes as needed for chest pain. 10/05/17  Yes [name], PA-C    Family History Family History  Problem Relation Age of Onset  . Congestive Heart Failure Sister   . Diabetes Mother   . Hypertension Mother   . Diabetes Father   . Hypertension Father     Social History Social History   Tobacco Use  . Smoking status: Current Every Day Smoker    Packs/day: 0.50    Years: 32.00    Pack years: 16.00    Types: Cigarettes  . Smokeless tobacco: Never Used  Substance Use Topics  . Alcohol use: No    Alcohol/week: 0.0 standard drinks  . Drug use: No     Allergies   Omeprazole magnesium and Esomeprazole   Review of Systems Review of Systems  Constitutional: Positive for chills and fever. Negative for appetite change.  HENT: Positive for tinnitus. Negative for congestion and sore throat.   Respiratory: Positive for shortness of breath and wheezing.   Cardiovascular: Positive for chest pain. Negative for leg swelling.  Gastrointestinal: Positive for abdominal pain, nausea and vomiting. Negative for diarrhea.  Genitourinary: Negative for dysuria, frequency and urgency.  Musculoskeletal: Negative for back pain.  Skin: Negative for rash.  Allergic/Immunologic: Negative for immunocompromised state.  Neurological: Negative for headaches.    Psychiatric/Behavioral: Negative for confusion.   Physical Exam Updated Vital Signs BP (!) 124/99   Pulse (!) 113   Temp 97.8 F (36.6 C) (Rectal)   Resp (!) 28   SpO2 95%   Physical Exam  Constitutional: He appears well-developed. He appears ill. He appears distressed.  Gray skin coloration.   HENT:  Head: Normocephalic.  Eyes: Conjunctivae are normal.  Neck: Normal range of motion. Neck supple.  Cardiovascular: Regular rhythm, normal heart sounds and intact distal pulses. Tachycardia present. Exam reveals no gallop and no friction rub.  No murmur heard. Pulmonary/Chest: Accessory muscle usage present. No stridor. Tachypnea noted. He has wheezes. He has no rales.  Abdominal: Soft. Bowel sounds are normal. He exhibits no distension and no mass. There is tenderness. There is no rebound and no guarding. No hernia.  Neurological: He is alert.  Skin: Skin is warm and dry.  Psychiatric: His behavior is normal.  Nursing note and vitals reviewed.  ED Treatments / Results  Labs (all labs ordered are listed, but  only abnormal results are displayed) Labs Reviewed  COMPREHENSIVE METABOLIC PANEL - Abnormal; Notable for the following components:      Result Value   Sodium 133 (*)    Chloride 96 (*)    CO2 18 (*)    Glucose, Bld 540 (*)    Anion gap 19 (*)    All other components within normal limits  CBC - Abnormal; Notable for the following components:   WBC 16.9 (*)    All other components within normal limits  URINALYSIS, ROUTINE W REFLEX MICROSCOPIC - Abnormal; Notable for the following components:   Color, Urine COLORLESS (*)    Glucose, UA >=500 (*)    Hgb urine dipstick MODERATE (*)    Ketones, ur 20 (*)    All other components within normal limits  CBG MONITORING, ED - Abnormal; Notable for the following components:   Glucose-Capillary >600 (*)    All other components within normal limits  I-STAT VENOUS BLOOD GAS, ED - Abnormal; Notable for the following components:    pH, Ven 7.527 (*)    pCO2, Ven 24.2 (*)    TCO2 21 (*)    All other components within normal limits  I-STAT ARTERIAL BLOOD GAS, ED - Abnormal; Notable for the following components:   pH, Arterial 7.339 (*)    pO2, Arterial 77.0 (*)    Acid-base deficit 4.0 (*)    All other components within normal limits  CBG MONITORING, ED - Abnormal; Notable for the following components:   Glucose-Capillary 511 (*)    All other components within normal limits  CBG MONITORING, ED - Abnormal; Notable for the following components:   Glucose-Capillary 459 (*)    All other components within normal limits  LIPASE, BLOOD  SALICYLATE LEVEL  BLOOD GAS, ARTERIAL  RAPID URINE DRUG SCREEN, HOSP PERFORMED  ACETAMINOPHEN LEVEL  ETHANOL  POC OCCULT BLOOD, ED    EKG EKG Interpretation  Date/Time:  Thursday March 16 2018 06:08:50 EDT Ventricular Rate:  99 PR Interval:    QRS Duration: 141 QT Interval:  392 QTC Calculation: 504 R Axis:   -40 Text Interpretation:  Sinus rhythm RBBB and LAFB Confirmed by Nicanor Alcon, April ([id number]) on 03/16/2018 6:11:18 AM   Radiology Dg Chest Portable 1 View  Result Date: 03/16/2018 CLINICAL DATA:  47 y/o  M; tachypnea and shortness of breath. EXAM: PORTABLE CHEST 1 VIEW COMPARISON:  06/13/2017 chest radiograph. FINDINGS: Stable heart size and mediastinal contours are within normal limits. Both lungs are clear. Chronic lower right anterolateral rib fractures. No acute osseous abnormality is evident. IMPRESSION: No active disease. Electronically Signed   By: Mitzi Hansen M.D.   On: 03/16/2018 06:39    Procedures .Critical Care Performed by: Barkley Boards, PA-C Authorized by: Barkley Boards, PA-C   Critical care provider statement:    Critical care time (minutes):  55   Critical care time was exclusive of:  Separately billable procedures and treating other patients and teaching time   Critical care was necessary to treat or prevent imminent or  life-threatening deterioration of the following conditions:  Sepsis and endocrine crisis   Critical care was time spent personally by me on the following activities:  Review of old charts, re-evaluation of patient's condition, ordering and review of radiographic studies, ordering and review of laboratory studies, ordering and performing treatments and interventions, examination of patient, evaluation of patient's response to treatment, development of treatment plan with patient or surrogate and obtaining history from patient or surrogate   (  including critical care time)  Medications Ordered in ED Medications  dextrose 5 %-0.45 % sodium chloride infusion ( Intravenous Hold 03/16/18 0552)  insulin regular, human (MYXREDLIN) 100 units/ 100 mL infusion (8 Units/hr Intravenous Rate/Dose Change 03/16/18 0707)  sodium chloride 0.9 % bolus 1,000 mL (0 mLs Intravenous Stopped 03/16/18 0646)  LORazepam (ATIVAN) injection 1 mg (1 mg Intravenous Given 03/16/18 0619)  famotidine (PEPCID) IVPB 20 mg premix (0 mg Intravenous Stopped 03/16/18 0649)     Initial Impression / Assessment and Plan / ED Course  I have reviewed the triage vital signs and the nursing notes.  Pertinent labs & imaging results that were available during my care of the patient were reviewed by me and considered in my medical decision making (see chart for details).     47 year old male with a h/o of poorly controlled diabetes mellitus type 2, diabetic gastroparesis GERD, CAD, depression, HTN, and HLD presenting with vomiting, abdominal pain, fever, chills, chest pain, dyspnea, headache, and tinnitus.  The patient was seen and evaluated along with Dr. Terressa Koyanagi, attending physician.  Hypertensive in the 160-180s systolic in the ED. baseline blood pressure runs in the 110s 120s systolic.  He has mildly tachycardic, tachypneic, but afebrile. He appears critically ill and is in distress.   CBG 540 with bicarb of 18 and AG 19. VBG with  pH of 7.527?  The patient does endorse intermittent Pepto-Bismol use over the last few days and does take a baby aspirin daily.  He denies use of Goody powder.  Affected that this could be due to a salicylate vs other substance toxicity; however ABG with pH of 7.339, which was drawn after the patient received almost 2 L of fluids.  He denies EtOH use and has previously stopped smoking.  The patient also endorses hematemesis and dark stools for the last few days.  Dark stools could be secondary to Pepto-Bismol use.  Given concern for hematemesis, IV Pepcid given since the patient has an allergy to PPIs.  Hemoccult is negative.  CBC with leukocytosis of 16.9; however, I suspect this may partially be due to hemoconcentration from vomiting.  Patient's hemoglobin is also noted to be 16.5, but his baseline appears to be around 14 so at this time I am unsure if there is a drop in hemoglobin.  Imaging: CXR is unremarkable.   EKG with QTC of 504.  Ativan given for nausea and vomiting given prolonged QTC.  Will avoid other antiemetics.  On reevaluation, the patient's tachypnea and accessory muscle use has resolved.  His skin coloration is much more pink in color and he appears much improved.  He continues to endorse epigastric and right upper quadrant pain so right upper quadrant ultrasound is pending. Consulted the hospitalist team and Dr. Dartha Lodge will admit for further work-up and evaluation.  Final Clinical Impressions(s) / ED Diagnoses   Final diagnoses:  RUQ abdominal pain  Diabetic ketoacidosis without coma associated with type 2 diabetes mellitus [facility])    ED Discharge Orders    None       Barkley Boards, PA-C 03/16/18 0751    Palumbo, April, MD 03/21/18 1327

## 2018-03-16 NOTE — ED Notes (Signed)
Attempted report 

## 2018-03-16 NOTE — H&P (Signed)
History and Physical  [name] [name]:[id number] DOB: March 19, 1971 DOA: 03/16/2018  Referring physician: ER provider PCP: [name], PA-C  Outpatient Specialists:    Patient coming from: Home  Chief Complaint: Nausea, vomiting, abdominal pain and elevated blood sugar.  HPI: Patient is a 47 year old male, with past medical history significant for uncontrolled diabetes mellitus type 2, OSA, hypertension, hyperlipidemia, GERD, depression and coronary artery disease.  Patient is a very poor historian.  Patient presented with episodes of intermittent nausea, vomiting and abdominal pain while at someone's house.  Patient decided to present to the hospital rather than go back to his place of abode for further assessment and management.  On presentation to the hospital, patient was found to have blood sugar of 540 with anion gap of 19.  Patient was noted to be  volume depleted presentation, tachycardic and was said to be very ill looking.  Leukocytosis is noted, likely reactive.  The patient has been aggressively volume resuscitated by the ER provider.  Patient is currently on insulin drip.  Electrolytes are being monitored.  ER provider was worried that they may be associated coffee-ground emesis, but testing for occult bleeding has been negative (this is as per the ER provider).  No fever or chills, no headache, no neck pain, no chest pain, no urinary symptoms.  Patient tells me that he uses 40 units of subcutaneous Lantus insulin, as well as oral diabetic medications.  Hospitalist team has been asked to admit patient for further assessment and management.  ED Course: Kindly see above.  Pertinent labs: Chemistry reveals sodium of 133, potassium of 3.9, chloride 96, CO2 of 18 with blood sugar 540.  Serum creatinine is 1.22 (baseline of 0.84-1.03), anion gap of 19, and lipase of 26.  CBC reveals WBC of 16.9, hemoglobin of 16.5, hematocrit of 46.5, MCV of 85 with platelet count of 272.  Last HbA1c  checked on 01/11/2017 was 13%.  EKG: Independently reviewed.  Imaging: independently reviewed.   Review of Systems:  Negative for fever, visual changes, sore throat, rash, new muscle aches, chest pain, SOB, dysuria.  Past Medical History:  Diagnosis Date  . Anxiety   . CAD (coronary artery disease)    Moderate LAD disease 2016 - Dr. Jacinto Halim  . Depression   . GERD (gastroesophageal reflux disease)   . Hyperlipemia   . Hypertension   . Sleep apnea   . Type 2 diabetes mellitus (HCC)     Past Surgical History:  Procedure Laterality Date  . ANKLE SURGERY Bilateral 1982  . APPENDECTOMY  1975  . CARDIAC CATHETERIZATION N/A 03/28/2015   Procedure: Left Heart Cath and Coronary Angiography;  Surgeon: Yates Decamp, MD;  Location: [facility] INVASIVE CV LAB;  Service: Cardiovascular;  Laterality: N/A;  . CARDIAC CATHETERIZATION N/A 03/28/2015   Procedure: Intravascular Pressure Wire/FFR Study;  Surgeon: Yates Decamp, MD;  Location: [facility] INVASIVE CV LAB;  Service: Cardiovascular;  Laterality: N/A;  . CARPAL TUNNEL RELEASE Left   . COLONOSCOPY WITH ESOPHAGOGASTRODUODENOSCOPY (EGD)    . ELBOW FRACTURE SURGERY Left   . PILONIDAL CYST EXCISION N/A 03/24/2017   Procedure: EXCISION CHRONIC  PILONIDAL ABSCESS;  Surgeon: [name] Miyamoto, MD;  Location: [facility] ORS;  Service: General;  Laterality: N/A;     reports that he has been smoking cigarettes. He has a 16.00 pack-year smoking history. He has never used smokeless tobacco. He reports that he does not drink alcohol or use drugs.  Allergies  Allergen Reactions  . Omeprazole Magnesium Swelling  Face swells, no breathing impairment  . Esomeprazole Swelling    Face swells, no breathing impairment    Family History  Problem Relation Age of Onset  . Congestive Heart Failure Sister   . Diabetes Mother   . Hypertension Mother   . Diabetes Father   . Hypertension Father      Prior to Admission medications   Medication Sig Start Date End Date Taking?  Authorizing Provider  aspirin 81 MG tablet Take 81 mg by mouth daily.   Yes [provider]  atorvastatin (LIPITOR) 80 MG tablet Take 1 tablet (80 mg total) by mouth daily. 01/12/18  Yes Jonelle Sidle, MD  famotidine (PEPCID) 20 MG tablet Take 1 tablet (20 mg total) by mouth 2 (two) times daily. 03/06/18  Yes [name], PA-C  hydrOXYzine (ATARAX/VISTARIL) 25 MG tablet Take 25 mg by mouth 3 (three) times daily as needed for anxiety.    Yes [provider]  Insulin Glargine (LANTUS SOLOSTAR) 100 UNIT/ML Solostar Pen Inject 10 Units into the skin at bedtime. Patient taking differently: Inject 40 Units into the skin at bedtime.  09/05/17  Yes [name], PA-C  isosorbide mononitrate (IMDUR) 30 MG 24 hr tablet Take 0.5 tablets (15 mg total) by mouth daily. 01/12/18 04/12/18 Yes Jonelle Sidle, MD  metFORMIN (GLUCOPHAGE) 1000 MG tablet TAKE 1 Tablet  BY MOUTH TWICE DAILY WITH MEALS 09/20/17  Yes [name], PA-C  metoprolol tartrate (LOPRESSOR) 50 MG tablet Take 1 tablet (50 mg total) by mouth 2 (two) times daily. 02/20/18 05/21/18 Yes Dyann Kief, PA-C  nitroGLYCERIN (NITROSTAT) 0.4 MG SL tablet Place 1 tablet (0.4 mg total) under the tongue every 5 (five) minutes as needed for chest pain. 10/05/17  Yes [name], PA-C    Physical Exam: Vitals:   03/16/18 0440 03/16/18 0538 03/16/18 0617 03/16/18 0700  BP: (!) 168/104 (!) 188/109  (!) 124/99  Pulse: (!) 103 99  (!) 113  Resp: 20 16  (!) 28  Temp: 97.6 F (36.4 C)  97.8 F (36.6 C)   TempSrc: Oral  Rectal   SpO2: 99% 99%  95%   Constitutional:  . Appears calm and comfortable. Eyes:  . No pallor. No jaundice.  ENMT:  . external ears, nose appear normal.  Very dry buccal mucosa Neck:  . Neck is supple. No JVD Respiratory:  . CTA bilaterally, no w/r/r.  . Respiratory effort normal. No retractions or accessory muscle use Cardiovascular:  . S1S2 . No LE extremity edema   Abdomen:   . Abdomen is soft and tenderness is equivocal.  Organs are difficult to assess. Neurologic:  . Awake and alert. . Moves all limbs.  Wt Readings from Last 3 Encounters:  03/06/18 77.2 kg  02/20/18 77.1 kg  01/12/18 77.1 kg    I have personally reviewed following labs and imaging studies  Labs on Admission:  CBC: Recent Labs  Lab 03/16/18 0442  WBC 16.9*  HGB 16.5  HCT 46.5  MCV 85.6  PLT 272   Basic Metabolic Panel: Recent Labs  Lab 03/16/18 0442  NA 133*  K 3.9  CL 96*  CO2 18*  GLUCOSE 540*  BUN 11  CREATININE 1.22  CALCIUM 9.6   Liver Function Tests: Recent Labs  Lab 03/16/18 0442  AST 16  ALT 15  ALKPHOS 123  BILITOT 1.2  PROT 7.3  ALBUMIN 3.8   Recent Labs  Lab 03/16/18 0442  LIPASE 26   No results for input(s):  AMMONIA in the last 168 hours. Coagulation Profile: No results for input(s): INR, PROTIME in the last 168 hours. Cardiac Enzymes: No results for input(s): CKTOTAL, CKMB, CKMBINDEX, TROPONINI in the last 168 hours. BNP (last 3 results) No results for input(s): PROBNP in the last 8760 hours. HbA1C: No results for input(s): HGBA1C in the last 72 hours. CBG: Recent Labs  Lab 03/16/18 0540 03/16/18 0600 03/16/18 0706  GLUCAP >600* 511* 459*   Lipid Profile: No results for input(s): CHOL, HDL, LDLCALC, TRIG, CHOLHDL, LDLDIRECT in the last 72 hours. Thyroid Function Tests: No results for input(s): TSH, T4TOTAL, FREET4, T3FREE, THYROIDAB in the last 72 hours. Anemia Panel: No results for input(s): VITAMINB12, FOLATE, FERRITIN, TIBC, IRON, RETICCTPCT in the last 72 hours. Urine analysis:    Component Value Date/Time   COLORURINE YELLOW 05/31/2017 0257   APPEARANCEUR CLEAR 05/31/2017 0257   LABSPEC 1.021 05/31/2017 0257   PHURINE 5.0 05/31/2017 0257   GLUCOSEU >=500 (A) 05/31/2017 0257   HGBUR SMALL (A) 05/31/2017 0257   BILIRUBINUR NEGATIVE 05/31/2017 0257   KETONESUR 80 (A) 05/31/2017 0257   PROTEINUR NEGATIVE 05/31/2017  0257   NITRITE NEGATIVE 05/31/2017 0257   LEUKOCYTESUR NEGATIVE 05/31/2017 0257   Sepsis Labs: @LABRCNTIP (procalcitonin:4,lacticidven:4) )No results found for this or any previous visit (from the past 240 hour(s)).    Radiological Exams on Admission: Dg Chest Portable 1 View  Result Date: 03/16/2018 CLINICAL DATA:  47 y/o  M; tachypnea and shortness of breath. EXAM: PORTABLE CHEST 1 VIEW COMPARISON:  06/13/2017 chest radiograph. FINDINGS: Stable heart size and mediastinal contours are within normal limits. Both lungs are clear. Chronic lower right anterolateral rib fractures. No acute osseous abnormality is evident. IMPRESSION: No active disease. Electronically Signed   By: Mitzi Hansen M.D.   On: 03/16/2018 06:39    EKG: Independently reviewed.   Active Problems:   * No active hospital problems. *   Assessment/Plan Diabetic ketoacidosis: Admit patient to stepdown Continue aggressive hydration  Continue insulin drip Continue Accu-Cheks every hour while on insulin drip Check chemistry, mag and phosphorus every 4 hours When anion gap closes, transition to long-acting insulin and sliding scale insulin. HbA1c Diabetic teaching  Nausea and vomiting: Keep n.p.o. for now. Continue with IV fluids. History of diabetic gastroparesis noted Patient is currently in DKA. Hopefully, nausea and vomiting will resolve with resolution of DKA. IV Reglan.   Monitor and correct abnormal electrolytes.  Hypertension: Currently uncontrolled. Cautious control. Resume home medications. Further management depend on hospital course.  History of coronary artery disease: Stable for now. No symptoms reported.  History of hyperlipidemia: Continue current medications.  Concerns for possible, though, unconfirmed coffee-ground emesis: Monitor closely.  Further management will depend on hospital course.  DVT prophylaxis: SCD Code Status: Full code Family Communication:   Disposition Plan: Home eventually Consults called: Diabetes coordinator Admission status: Inpatient   Patient will be admitted and managed on an inpatient basis.  Patient's condition is such that patient could deteriorate if not managed on an inpatient basis.  Time spent: 65 minutes  Berton Mount, MD  [facility] Pager #: [phone] 7PM-7AM contact night coverage as above  03/16/2018, 7:32 AM

## 2018-03-16 NOTE — ED Triage Notes (Signed)
Patient reports generalized abdominal pain with persistent emesis onset last night , denies diarrhea or fever .

## 2018-03-17 ENCOUNTER — Inpatient Hospital Stay (HOSPITAL_COMMUNITY): Payer: Medicaid Other

## 2018-03-17 DIAGNOSIS — R112 Nausea with vomiting, unspecified: Secondary | ICD-10-CM

## 2018-03-17 DIAGNOSIS — E1110 Type 2 diabetes mellitus with ketoacidosis without coma: Principal | ICD-10-CM

## 2018-03-17 DIAGNOSIS — I2510 Atherosclerotic heart disease of native coronary artery without angina pectoris: Secondary | ICD-10-CM

## 2018-03-17 DIAGNOSIS — E876 Hypokalemia: Secondary | ICD-10-CM

## 2018-03-17 LAB — BASIC METABOLIC PANEL
Anion gap: 9 (ref 5–15)
BUN: 7 mg/dL (ref 6–20)
CO2: 22 mmol/L (ref 22–32)
Calcium: 8.2 mg/dL — ABNORMAL LOW (ref 8.9–10.3)
Chloride: 107 mmol/L (ref 98–111)
Creatinine, Ser: 0.94 mg/dL (ref 0.61–1.24)
GFR calc Af Amer: >60 mL/min (ref >60)
GFR calc non Af Amer: >60 mL/min (ref >60)
Glucose, Bld: 195 mg/dL — ABNORMAL HIGH (ref 70–99)
Potassium: 3.4 mmol/L — ABNORMAL LOW (ref 3.5–5.1)
Sodium: 138 mmol/L (ref 135–145)

## 2018-03-17 LAB — GLUCOSE, CAPILLARY
Glucose-Capillary: 183 mg/dL — ABNORMAL HIGH (ref 70–99)
Glucose-Capillary: 219 mg/dL — ABNORMAL HIGH (ref 70–99)
Glucose-Capillary: 207 mg/dL — ABNORMAL HIGH (ref 70–99)
Glucose-Capillary: 143 mg/dL — ABNORMAL HIGH (ref 70–99)
Glucose-Capillary: 154 mg/dL — ABNORMAL HIGH (ref 70–99)

## 2018-03-17 LAB — HEMOGLOBIN A1C
Hgb A1c MFr Bld: 15.2 % — ABNORMAL HIGH (ref 4.8–5.6)
MEAN PLASMA GLUCOSE: 389.54 mg/dL

## 2018-03-17 LAB — HIV ANTIBODY (ROUTINE TESTING W REFLEX): HIV Screen 4th Generation wRfx: NONREACTIVE

## 2018-03-17 MED ORDER — POTASSIUM CHLORIDE CRYS ER 20 MEQ PO TBCR
40.0000 meq | EXTENDED_RELEASE_TABLET | Freq: Once | ORAL | Status: AC
  Administered 2018-03-17: 40 meq via ORAL
  Filled 2018-03-17: qty 2

## 2018-03-17 MED ORDER — PROMETHAZINE HCL 25 MG/ML IJ SOLN
25.0000 mg | Freq: Four times a day (QID) | INTRAMUSCULAR | Status: DC | PRN
  Administered 2018-03-17 – 2018-03-19 (×5): 25 mg via INTRAVENOUS
  Filled 2018-03-17 (×5): qty 1

## 2018-03-17 MED ORDER — GLUCERNA SHAKE PO LIQD
237.0000 mL | Freq: Three times a day (TID) | ORAL | Status: DC
  Administered 2018-03-18 – 2018-03-19 (×2): 237 mL via ORAL

## 2018-03-17 MED ORDER — KETOROLAC TROMETHAMINE 15 MG/ML IJ SOLN
15.0000 mg | Freq: Four times a day (QID) | INTRAMUSCULAR | Status: DC | PRN
  Administered 2018-03-17 (×2): 15 mg via INTRAVENOUS
  Filled 2018-03-17 (×3): qty 1

## 2018-03-17 MED ORDER — METOCLOPRAMIDE HCL 5 MG/5ML PO SOLN
10.0000 mg | Freq: Three times a day (TID) | ORAL | Status: DC
  Administered 2018-03-17 – 2018-03-19 (×6): 10 mg via ORAL
  Filled 2018-03-17 (×7): qty 10

## 2018-03-17 MED ORDER — HYDRALAZINE HCL 20 MG/ML IJ SOLN
10.0000 mg | Freq: Four times a day (QID) | INTRAMUSCULAR | Status: DC | PRN
  Administered 2018-03-17 – 2018-03-18 (×3): 10 mg via INTRAVENOUS
  Filled 2018-03-17 (×3): qty 1

## 2018-03-17 MED ORDER — ONDANSETRON HCL 4 MG/2ML IJ SOLN
4.0000 mg | Freq: Four times a day (QID) | INTRAMUSCULAR | Status: DC | PRN
  Administered 2018-03-17 – 2018-03-18 (×3): 4 mg via INTRAVENOUS
  Filled 2018-03-17 (×3): qty 2

## 2018-03-17 MED ORDER — ENOXAPARIN SODIUM 40 MG/0.4ML ~~LOC~~ SOLN
40.0000 mg | SUBCUTANEOUS | Status: DC
  Administered 2018-03-17 – 2018-03-18 (×2): 40 mg via SUBCUTANEOUS
  Filled 2018-03-17 (×2): qty 0.4

## 2018-03-17 NOTE — Progress Notes (Signed)
PROGRESS NOTE                                                                                                                                                                                                             Patient Demographics:    [name] [name], is a 47 y.o. male, DOB - 30-Jan-1971, RUE:[id number]  Admit date - 03/16/2018   Admitting Physician Barnetta Chapel, MD  Outpatient Primary MD for the patient is [name], PA-C  LOS - 1  Chief Complaint  Patient presents with  . Abdominal Pain       Brief Narrative 47 year old gentleman with history of DM type II, OSA, hypertension, dyslipidemia, GERD, CAD, depression who came into the hospital with elevated blood sugars along with some epigastric abdominal pain nausea and vomiting, diagnosed with DKA and admitted to the hospital.   Subjective:    [name] [name] today has, No headache, No chest pain, gastric abdominal pain with nausea and vomiting this morning , No new weakness tingling or numbness, No Cough - SOB.     Assessment  & Plan :     1.  DKA in a patient with DM type II.  Question compliance, check A1c previously A1c has been around 13 suggesting poor outpatient control, transition off insulin drip, continue gentle hydration and he appears still dehydrated, transition to Lantus and sliding scale and monitor CBGs.  2.  Hydration and hypokalemia.  IV normal saline along with oral potassium.  3.  OSA.  Does not wear CPAP.  Outpatient pulmonary follow-up.  4.  CAD.  Continue combination of aspirin, statin, beta-blocker for secondary prevention.  No acute issues.  5. Dyslipidemia.  On statin continue.    Family Communication  :  None  Code Status :  Full  Disposition Plan  :  Home in am  Consults  :  None  Procedures  :    DVT Prophylaxis  :  Lovenox - started  Lab Results  Component Value Date   PLT 272 03/16/2018    Diet :  Diet Order            Diet full liquid Room service  appropriate? Yes; Fluid consistency: Thin  Diet effective now               Inpatient Medications Scheduled Meds: . aspirin EC  81 mg Oral Daily  . atorvastatin  80 mg Oral q1800  . feeding supplement (ENSURE ENLIVE)  237 mL Oral BID BM  .  Influenza vac split quadrivalent PF  0.5 mL Intramuscular Tomorrow-1000  . insulin aspart  0-5 Units Subcutaneous QHS  . insulin aspart  0-9 Units Subcutaneous TID WC  . insulin glargine  15 Units Subcutaneous BID  . isosorbide mononitrate  15 mg Oral Daily  . metoprolol tartrate  50 mg Oral BID   Continuous Infusions: . sodium chloride 75 mL/hr at 03/16/18 2332   PRN Meds:.ketorolac, nitroGLYCERIN, ondansetron (ZOFRAN) IV  Antibiotics  :   Anti-infectives (From admission, onward)   None          Objective:   Vitals:   03/16/18 2128 03/17/18 0128 03/17/18 0434 03/17/18 0853  BP:  106/71  (!) 177/114  Pulse:  71  86  Resp:  (!) 21    Temp: 98.9 F (37.2 C)  98.2 F (36.8 C)   TempSrc: Oral     SpO2:  94%    Weight:      Height:        Wt Readings from Last 3 Encounters:  03/16/18 75.6 kg  03/06/18 77.2 kg  02/20/18 77.1 kg     Intake/Output Summary (Last 24 hours) at 03/17/2018 1002 Last data filed at 03/17/2018 0332 Gross per 24 hour  Intake 2655.47 ml  Output 800 ml  Net 1855.47 ml     Physical Exam  Awake Alert, Oriented X 3, No new F.N deficits, Normal affect [place].AT,PERRAL Supple Neck,No JVD, No cervical lymphadenopathy appriciated.  Symmetrical Chest wall movement, Good air movement bilaterally, CTAB RRR,No Gallops,Rubs or new Murmurs, No Parasternal Heave +ve B.Sounds, Abd Soft, No tenderness, No organomegaly appriciated, No rebound - guarding or rigidity. No Cyanosis, Clubbing or edema, No new Rash or bruise       Data Review:    CBC Recent Labs  Lab 03/16/18 0442  WBC 16.9*  HGB 16.5  HCT 46.5  PLT 272  MCV 85.6  MCH 30.4  MCHC 35.5  RDW 11.6    Chemistries  Recent Labs  Lab  03/16/18 0442 03/16/18 0848 03/16/18 1145 03/16/18 1356 03/16/18 1713 03/16/18 2102 03/17/18 0326  NA 133* 139  --  142 140 141 138  K 3.9 3.5  --  4.0 3.8 3.3* 3.4*  CL 96* 103  --  106 107 108 107  CO2 18* 23  --  25 22 20* 22  GLUCOSE 540* 283*  --  147* 130* 182* 195*  BUN 11 8  --  8 6 7 7   CREATININE 1.22 1.06  --  0.95 0.82 0.93 0.94  CALCIUM 9.6 8.7*  --  8.5* 8.3* 8.3* 8.2*  MG  --  1.7 1.7 1.7  --  1.7  --   AST 16  --   --   --   --   --   --   ALT 15  --   --   --   --   --   --   ALKPHOS 123  --   --   --   --   --   --   BILITOT 1.2  --   --   --   --   --   --    ------------------------------------------------------------------------------------------------------------------ No results for input(s): CHOL, HDL, LDLCALC, TRIG, CHOLHDL, LDLDIRECT in the last 72 hours.  Lab Results  Component Value Date   HGBA1C 13.0 (H) 01/11/2018   ------------------------------------------------------------------------------------------------------------------ No results for input(s): TSH, T4TOTAL, T3FREE, THYROIDAB in the last 72 hours.  Invalid input(s): FREET3 ------------------------------------------------------------------------------------------------------------------ No results for  input(s): VITAMINB12, FOLATE, FERRITIN, TIBC, IRON, RETICCTPCT in the last 72 hours.  Coagulation profile No results for input(s): INR, PROTIME in the last 168 hours.  No results for input(s): DDIMER in the last 72 hours.  Cardiac Enzymes No results for input(s): CKMB, TROPONINI, MYOGLOBIN in the last 168 hours.  Invalid input(s): CK ------------------------------------------------------------------------------------------------------------------ No results found for: BNP  Micro Results Recent Results (from the past 240 hour(s))  MRSA PCR Screening     Status: None   Collection Time: 03/16/18  1:42 PM  Result Value Ref Range Status   MRSA by PCR NEGATIVE NEGATIVE Final     Comment:        The GeneXpert MRSA Assay (FDA approved for NASAL specimens only), is one component of a comprehensive MRSA colonization surveillance program. It is not intended to diagnose MRSA infection nor to guide or monitor treatment for MRSA infections. Performed at [facility], [address], [place], [place] [address]     Radiology Reports Dg Chest Portable 1 View  Result Date: 03/16/2018 CLINICAL DATA:  47 y/o  M; tachypnea and shortness of breath. EXAM: PORTABLE CHEST 1 VIEW COMPARISON:  06/13/2017 chest radiograph. FINDINGS: Stable heart size and mediastinal contours are within normal limits. Both lungs are clear. Chronic lower right anterolateral rib fractures. No acute osseous abnormality is evident. IMPRESSION: No active disease. Electronically Signed   By: Mitzi Hansen M.D.   On: 03/16/2018 06:39   Dg Abd Portable 1v  Result Date: 03/17/2018 CLINICAL DATA:  Nausea vomiting EXAM: PORTABLE ABDOMEN - 1 VIEW COMPARISON:  06/13/2017 FINDINGS: The bowel gas pattern is normal. No radio-opaque calculi or other significant radiographic abnormality are seen. IMPRESSION: Negative. Electronically Signed   By: Marlan Palau M.D.   On: 03/17/2018 09:46   US Abdomen Limited Ruq  Result Date: 03/16/2018 CLINICAL DATA:  Right upper quadrant abdominal pain EXAM: ULTRASOUND ABDOMEN LIMITED RIGHT UPPER QUADRANT COMPARISON:  05/24/2017 FINDINGS: Gallbladder: No gallstones or wall thickening visualized. No sonographic Murphy sign noted by sonographer. Common bile duct: Diameter: Normal caliber, 5 mm Liver: Increased echotexture compatible with fatty infiltration. No focal abnormality or biliary ductal dilatation. Portal vein is patent on color Doppler imaging with normal direction of blood flow towards the liver. IMPRESSION: Fatty infiltration of the liver. No acute findings. Electronically Signed   By: Charlett Nose M.D.   On: 03/16/2018 08:09    Time Spent in minutes   30   Susa Raring M.D on 03/17/2018 at 10:02 AM  To page go to [email or web address] - password [facility]

## 2018-03-17 NOTE — Progress Notes (Signed)
Patient continues to have intractable N/V . 300cc  brown emesis  collected . PRN Phenergan 25 mg given

## 2018-03-17 NOTE — Progress Notes (Addendum)
Initial Nutrition Assessment  DOCUMENTATION CODES:   Not applicable  INTERVENTION:  Glucerna Shake po TID, each supplement provides 220 kcal and 10 grams of protein. Patient likes chocolate flavor.  D/c Ensure Enlive  NUTRITION DIAGNOSIS:   Inadequate oral intake related to nausea, vomiting, decreased appetite as evidenced by per patient/family report.  GOAL:   Patient will meet greater than or equal to 90% of their needs  MONITOR:   PO intake, Supplement acceptance, Diet advancement, Weight trends, Labs  REASON FOR ASSESSMENT:   Malnutrition Screening Tool    ASSESSMENT:   [name] is a 47 yo male with PMH of type 2 diabetes uncontrolled, gastroparesis, CAD, COPD, HLD, HTN, depression and anxiety admitted for diabetic ketoacidosis (DKA).  Student dietitian visited patient at bedside today. He is resting uncomfortably in bed; having N/V and "burning" sensation in throat. Patient reports vomiting "white stuff" 5-6 times today. Witnessed patient vomiting towards end of assessment.   Patient reports unintentional wt loss within "last couple of years" going from ~200 to 168 lbs. Unable to confirm this wt loss from past encounters. Most recent wt 10/17 was 166 lbs. Reviewing encounters since 07/2017 wt appears to have been stable.  Patient short with answers during interview. Says he eats "soup, salad, whatever" and reports not eating meals during breakfast or dinner; drinks water, Dr. Reino Kent, [facility], and tea. Says he will "nibble" on what his parents are cooking. Per patient he lives with his parents.   Patient has poor dentition requiring dentures; says that he has them at home but doesn't use them to eat because they are uncomfortable.   Ensure at bedside opened but not drank. He says he doesn't like them. Per chart patient changed to full liquid diet this AM. No meal completion data on file. Will d/c Ensure and try Glucerna as CBGs continue to be elevated.    Medications reviewed and include: Novolog, Lantus  Labs reviewed: CBG 115-242, potassium 3.4, A1C 15.2  NUTRITION - FOCUSED PHYSICAL EXAM:    Most Recent Value  Orbital Region  No depletion  Upper Arm Region  Mild depletion  Thoracic and Lumbar Region  No depletion  Buccal Region  No depletion  Temple Region  Mild depletion  Clavicle Bone Region  Mild depletion  Clavicle and Acromion Bone Region  Mild depletion  Scapular Bone Region  No depletion  Dorsal Hand  No depletion  Patellar Region  Mild depletion  Anterior Thigh Region  Mild depletion  Posterior Calf Region  Mild depletion  Edema (RD Assessment)  None  Hair  Reviewed  Eyes  Reviewed  Mouth  Reviewed [poor dentition]  Skin  Reviewed  Nails  Reviewed      Diet Order:   Diet Order            Diet full liquid Room service appropriate? Yes; Fluid consistency: Thin  Diet effective now              EDUCATION NEEDS:   Not appropriate for education at this time  Skin:  Skin Assessment: Reviewed RN Assessment  Last BM:  10/17  Height:   Ht Readings from Last 1 Encounters:  03/16/18 5\' 8"  (1.727 m)    Weight:   Wt Readings from Last 1 Encounters:  03/16/18 75.6 kg    Ideal Body Weight:  70 kg  BMI:  Body mass index is 25.34 kg/m.  Estimated Nutritional Needs:   Kcal:  1900-2100  Protein:  95-105  Fluid:  1.9-2.1 L    Jadarius Commons, Dietetic Intern [phone]

## 2018-03-17 NOTE — Plan of Care (Signed)
Patient is making progress, toward discharge, blood glucose stabilized. Insulin gtt discontinued, patient remained alert and oriented, no acute distress noted.

## 2018-03-17 NOTE — Progress Notes (Addendum)
Inpatient Diabetes Program Recommendations  AACE/ADA: New Consensus Statement on Inpatient Glycemic Control (2015)  Target Ranges:  Prepandial:   less than 140 mg/dL      Peak postprandial:   less than 180 mg/dL (1-2 hours)      Critically ill patients:  140 - 180 mg/dL   Lab Results  Component Value Date   GLUCAP 207 (H) 03/17/2018   HGBA1C 15.2 (H) 03/17/2018    Review of Glycemic Control Results for MAXTEN, SHULER (MRN [id number]) as of 03/17/2018 14:18  Ref. Range 03/16/2018 22:14 03/17/2018 01:53 03/17/2018 08:53 03/17/2018 13:11  Glucose-Capillary Latest Ref Range: 70 - 99 mg/dL 119 (H) 147 (H) 829 (H) 207 (H)   Diabetes history: Type 2 DM Outpatient Diabetes medications: Lantus 40 units QD, Metformin 1000 mg BID Current orders for Inpatient glycemic control: Lantus 15 units BID, Novolog 0-9 units TID, Novolog 0-5 units QHS  Inpatient Diabetes Program Recommendations:    Spoke with patient regarding outpatient management of diabetes. Patient admits to not taking insulin dosages, especially, once he began feeling bad. Not clear as to when he stopped. Patient states, "I feel so bad, I didn't have energy to take my insulin." Recently, lost his job and insurance; living with his parents, who per patient were "supportive" and help patient with expenses.   Reviewed patient's current A1c of 15.2%, up from 13% in August, which is up from 8% in Dec. 2018. Explained what a A1c is and what it measures. Also reviewed goal A1c with patient, importance of good glucose control @ home, and blood sugar goals. Reviewed patho for DM, need for insulin, DKA, gastroparesis, survival skills, sick day rules and comorbidites.  Patient has a meter and supplies (including additional insulin). He states, "My doctor told me to check in the mornings." He has been following up at the [facility]. Encouraged to start checking blood sugars 2-3 times per day if able; As what he is reporting  doesn't match his current A1C. Education provided to patient on importance of checking blood sugars even during sick days and communicating with PCP regarding needs. Reviewed sick day management and encouraged patient to keep basal insulin on bedside table to ensure that he doesn't miss doses. Patient agrees that this would have prevented this. Feels that some of this is related to lack of energy and other mental health issues he is trying to deal with.  Patient plans [facility] him Lantus at no cost) and at [facility]. Patient has no further questions at this time regarding DM.   Thanks, Lujean Rave, MSN, RNC-OB Diabetes Coordinator [phone] (8a-5p)

## 2018-03-18 LAB — BASIC METABOLIC PANEL
Anion gap: 15 (ref 5–15)
BUN: 11 mg/dL (ref 6–20)
CO2: 19 mmol/L — ABNORMAL LOW (ref 22–32)
Calcium: 8.7 mg/dL — ABNORMAL LOW (ref 8.9–10.3)
Chloride: 104 mmol/L (ref 98–111)
Creatinine, Ser: 1.02 mg/dL (ref 0.61–1.24)
GFR calc Af Amer: >60 mL/min (ref >60)
GFR calc non Af Amer: >60 mL/min (ref >60)
Glucose, Bld: 217 mg/dL — ABNORMAL HIGH (ref 70–99)
Potassium: 3.8 mmol/L (ref 3.5–5.1)
Sodium: 138 mmol/L (ref 135–145)

## 2018-03-18 LAB — GLUCOSE, CAPILLARY
Glucose-Capillary: 208 mg/dL — ABNORMAL HIGH (ref 70–99)
Glucose-Capillary: 215 mg/dL — ABNORMAL HIGH (ref 70–99)
Glucose-Capillary: 104 mg/dL — ABNORMAL HIGH (ref 70–99)
Glucose-Capillary: 94 mg/dL (ref 70–99)

## 2018-03-18 MED ORDER — MAGNESIUM SULFATE IN D5W 1-5 GM/100ML-% IV SOLN
1.0000 g | Freq: Once | INTRAVENOUS | Status: AC
  Administered 2018-03-18: 1 g via INTRAVENOUS
  Filled 2018-03-18: qty 100

## 2018-03-18 MED ORDER — PHENOL 1.4 % MT LIQD
1.0000 | OROMUCOSAL | Status: DC | PRN
  Administered 2018-03-18: 1 via OROMUCOSAL
  Filled 2018-03-18: qty 177

## 2018-03-18 MED ORDER — GI COCKTAIL ~~LOC~~
30.0000 mL | Freq: Three times a day (TID) | ORAL | Status: DC
  Administered 2018-03-18 – 2018-03-19 (×4): 30 mL via ORAL
  Filled 2018-03-18 (×4): qty 30

## 2018-03-18 MED ORDER — FAMOTIDINE IN NACL 20-0.9 MG/50ML-% IV SOLN
20.0000 mg | Freq: Two times a day (BID) | INTRAVENOUS | Status: DC
  Administered 2018-03-18: 20 mg via INTRAVENOUS
  Filled 2018-03-18: qty 50

## 2018-03-18 MED ORDER — FAMOTIDINE 20 MG PO TABS
20.0000 mg | ORAL_TABLET | Freq: Two times a day (BID) | ORAL | Status: DC
  Administered 2018-03-18 – 2018-03-19 (×2): 20 mg via ORAL
  Filled 2018-03-18 (×2): qty 1

## 2018-03-18 NOTE — Progress Notes (Signed)
Patient  N/V uncontrolled with PRN Zofran and phenergan . Isolated BP(174/106) and PRN hydralazine given with relief

## 2018-03-18 NOTE — Progress Notes (Signed)
PROGRESS NOTE                                                                                                                                                                                                             Patient Demographics:    [name] [name], is a 47 y.o. male, DOB - 10/29/1970, GEX:[id number]  Admit date - 03/16/2018   Admitting Physician Barnetta Chapel, MD  Outpatient Primary MD for the patient is [name], PA-C  LOS - 2  Chief Complaint  Patient presents with  . Abdominal Pain       Brief Narrative 46 year old gentleman with history of DM type II, OSA, hypertension, dyslipidemia, GERD, CAD, depression who came into the hospital with elevated blood sugars along with some epigastric abdominal pain nausea and vomiting, diagnosed with DKA and admitted to the hospital.   Subjective:   Patient in bed, appears comfortable, denies any headache, no fever, no chest pain or pressure, no shortness of breath , no abdominal pain, still having nausea and vomiting. No focal weakness.    Assessment  & Plan :     1.  DKA in a patient with DM type II.  History of poor outpatient compliance and continues to be noncompliant A1c now 15 from 13 few months ago, treated with insulin drip and IV fluids per DKA protocol, DKA has resolved, has been transitioned to Lantus and sliding scale, continue to monitor CBGs.  CBG (last 3)  Recent Labs    03/17/18 1755 03/17/18 2149 03/18/18 0803  GLUCAP 143* 154* 208*    2.  Dehydration and hypokalemia.  Asked him normal continue gentle hydration with IV fluids.  3.  OSA.  Does not wear CPAP.  Outpatient pulmonary follow-up.  4.  CAD.  Continue combination of aspirin, statin, beta-blocker for secondary prevention.  No acute issues.  5. Dyslipidemia.  On statin continue.  6.  Severe diabetic gastroparesis.  Has history of the same, counseled on diabetic compliance, Reglan scheduled PRN hydralazine, IV Pepcid twice  daily along with GI cocktail.  Is allergic to PI.  Outpatient GI follow-up.  KUB & exam unremarkable.    Family Communication  :  None  Code Status :  Full  Disposition Plan  :  Home in am  Consults  :  None  Procedures  :    DVT Prophylaxis  :  Lovenox - started  Lab Results  Component Value Date   PLT 272 03/16/2018    Diet :  Diet Order            Diet Carb Modified Fluid consistency: Thin; Room service appropriate? Yes  Diet effective now               Inpatient Medications Scheduled Meds: . aspirin EC  81 mg Oral Daily  . atorvastatin  80 mg Oral q1800  . enoxaparin (LOVENOX) injection  40 mg Subcutaneous Q24H  . feeding supplement (GLUCERNA SHAKE)  237 mL Oral TID BM  . gi cocktail  30 mL Oral TID  . Influenza vac split quadrivalent PF  0.5 mL Intramuscular Tomorrow-1000  . insulin aspart  0-5 Units Subcutaneous QHS  . insulin aspart  0-9 Units Subcutaneous TID WC  . insulin glargine  15 Units Subcutaneous BID  . isosorbide mononitrate  15 mg Oral Daily  . metoCLOPramide  10 mg Oral TID AC  . metoprolol tartrate  50 mg Oral BID   Continuous Infusions: . sodium chloride 75 mL/hr at 03/18/18 0700  . famotidine (PEPCID) IV    . magnesium sulfate 1 - 4 g bolus IVPB     PRN Meds:.hydrALAZINE, ketorolac, nitroGLYCERIN, phenol, promethazine  Antibiotics  :   Anti-infectives (From admission, onward)   None          Objective:   Vitals:   03/18/18 0041 03/18/18 0310 03/18/18 0329 03/18/18 0353  BP: 118/79 (!) 165/104  (!) 141/90  Pulse: 91 (!) 105 (!) 112 (!) 106  Resp: 14 16 (!) 23 19  Temp: 99.1 F (37.3 C)   98.6 F (37 C)  TempSrc: Oral   Oral  SpO2: 94% 94% 98% 97%  Weight:      Height:        Wt Readings from Last 3 Encounters:  03/16/18 75.6 kg  03/06/18 77.2 kg  02/20/18 77.1 kg     Intake/Output Summary (Last 24 hours) at 03/18/2018 0929 Last data filed at 03/18/2018 0724 Gross per 24 hour  Intake 2019.62 ml  Output 1525 ml   Net 494.62 ml     Physical Exam  Awake Alert, Oriented X 3, No new F.N deficits, Normal affect [place].AT,PERRAL Supple Neck,No JVD, No cervical lymphadenopathy appriciated.  Symmetrical Chest wall movement, Good air movement bilaterally, CTAB RRR,No Gallops, Rubs or new Murmurs, No Parasternal Heave +ve B.Sounds, Abd Soft, No tenderness, No organomegaly appriciated, No rebound - guarding or rigidity. No Cyanosis, Clubbing or edema, No new Rash or bruise    Data Review:    CBC Recent Labs  Lab 03/16/18 0442  WBC 16.9*  HGB 16.5  HCT 46.5  PLT 272  MCV 85.6  MCH 30.4  MCHC 35.5  RDW 11.6    Chemistries  Recent Labs  Lab 03/16/18 0442 03/16/18 0848 03/16/18 1145 03/16/18 1356 03/16/18 1713 03/16/18 2102 03/17/18 0326 03/18/18 0529  NA 133* 139  --  142 140 141 138 138  K 3.9 3.5  --  4.0 3.8 3.3* 3.4* 3.8  CL 96* 103  --  106 107 108 107 104  CO2 18* 23  --  25 22 20* 22 19*  GLUCOSE 540* 283*  --  147* 130* 182* 195* 217*  BUN 11 8  --  8 6 7 7 11   CREATININE 1.22 1.06  --  0.95 0.82 0.93 0.94 1.02  CALCIUM 9.6 8.7*  --  8.5* 8.3* 8.3* 8.2* 8.7*  MG  --  1.7 1.7 1.7  --  1.7  --   --   AST  16  --   --   --   --   --   --   --   ALT 15  --   --   --   --   --   --   --   ALKPHOS 123  --   --   --   --   --   --   --   BILITOT 1.2  --   --   --   --   --   --   --    ------------------------------------------------------------------------------------------------------------------ No results for input(s): CHOL, HDL, LDLCALC, TRIG, CHOLHDL, LDLDIRECT in the last 72 hours.  Lab Results  Component Value Date   HGBA1C 15.2 (H) 03/17/2018   ------------------------------------------------------------------------------------------------------------------ No results for input(s): TSH, T4TOTAL, T3FREE, THYROIDAB in the last 72 hours.  Invalid input(s):  FREET3 ------------------------------------------------------------------------------------------------------------------ No results for input(s): VITAMINB12, FOLATE, FERRITIN, TIBC, IRON, RETICCTPCT in the last 72 hours.  Coagulation profile No results for input(s): INR, PROTIME in the last 168 hours.  No results for input(s): DDIMER in the last 72 hours.  Cardiac Enzymes No results for input(s): CKMB, TROPONINI, MYOGLOBIN in the last 168 hours.  Invalid input(s): CK ------------------------------------------------------------------------------------------------------------------ No results found for: BNP  Micro Results Recent Results (from the past 240 hour(s))  MRSA PCR Screening     Status: None   Collection Time: 03/16/18  1:42 PM  Result Value Ref Range Status   MRSA by PCR NEGATIVE NEGATIVE Final    Comment:        The GeneXpert MRSA Assay (FDA approved for NASAL specimens only), is one component of a comprehensive MRSA colonization surveillance program. It is not intended to diagnose MRSA infection nor to guide or monitor treatment for MRSA infections. Performed at [facility], [address], [place], [place] [address]     Radiology Reports Dg Chest Portable 1 View  Result Date: 03/16/2018 CLINICAL DATA:  47 y/o  M; tachypnea and shortness of breath. EXAM: PORTABLE CHEST 1 VIEW COMPARISON:  06/13/2017 chest radiograph. FINDINGS: Stable heart size and mediastinal contours are within normal limits. Both lungs are clear. Chronic lower right anterolateral rib fractures. No acute osseous abnormality is evident. IMPRESSION: No active disease. Electronically Signed   By: Mitzi Hansen M.D.   On: 03/16/2018 06:39   Dg Abd Portable 1v  Result Date: 03/17/2018 CLINICAL DATA:  Nausea vomiting EXAM: PORTABLE ABDOMEN - 1 VIEW COMPARISON:  06/13/2017 FINDINGS: The bowel gas pattern is normal. No radio-opaque calculi or other significant radiographic  abnormality are seen. IMPRESSION: Negative. Electronically Signed   By: Marlan Palau M.D.   On: 03/17/2018 09:46   US Abdomen Limited Ruq  Result Date: 03/16/2018 CLINICAL DATA:  Right upper quadrant abdominal pain EXAM: ULTRASOUND ABDOMEN LIMITED RIGHT UPPER QUADRANT COMPARISON:  05/24/2017 FINDINGS: Gallbladder: No gallstones or wall thickening visualized. No sonographic Murphy sign noted by sonographer. Common bile duct: Diameter: Normal caliber, 5 mm Liver: Increased echotexture compatible with fatty infiltration. No focal abnormality or biliary ductal dilatation. Portal vein is patent on color Doppler imaging with normal direction of blood flow towards the liver. IMPRESSION: Fatty infiltration of the liver. No acute findings. Electronically Signed   By: Charlett Nose M.D.   On: 03/16/2018 08:09    Time Spent in minutes  30   Susa Raring M.D on 03/18/2018 at 9:29 AM  To page go to [email or web address] - password [facility]

## 2018-03-18 NOTE — Clinical Social Work Note (Signed)
CSW acknowledges consult that patient cannot afford medications. Please consult RNCM.  CSW signing off. Consult again if any social work needs arise.  Charlynn Court, CSW [phone]

## 2018-03-19 LAB — COMPREHENSIVE METABOLIC PANEL
ALT: 12 U/L (ref 0–44)
AST: 12 U/L — AB (ref 15–41)
Albumin: 2.8 g/dL — ABNORMAL LOW (ref 3.5–5.0)
Alkaline Phosphatase: 81 U/L (ref 38–126)
Anion gap: 8 (ref 5–15)
BUN: 13 mg/dL (ref 6–20)
CHLORIDE: 109 mmol/L (ref 98–111)
CO2: 21 mmol/L — ABNORMAL LOW (ref 22–32)
Calcium: 8.1 mg/dL — ABNORMAL LOW (ref 8.9–10.3)
Creatinine, Ser: 0.95 mg/dL (ref 0.61–1.24)
Glucose, Bld: 168 mg/dL — ABNORMAL HIGH (ref 70–99)
POTASSIUM: 3.6 mmol/L (ref 3.5–5.1)
SODIUM: 138 mmol/L (ref 135–145)
Total Bilirubin: 1.3 mg/dL — ABNORMAL HIGH (ref 0.3–1.2)
Total Protein: 5.9 g/dL — ABNORMAL LOW (ref 6.5–8.1)

## 2018-03-19 LAB — CBC
HCT: 43.5 % (ref 39.0–52.0)
Hemoglobin: 14 g/dL (ref 13.0–17.0)
MCH: 29.5 pg (ref 26.0–34.0)
MCHC: 32.2 g/dL (ref 30.0–36.0)
MCV: 91.6 fL (ref 80.0–100.0)
PLATELETS: 218 10*3/uL (ref 150–400)
RBC: 4.75 MIL/uL (ref 4.22–5.81)
RDW: 12.1 % (ref 11.5–15.5)
WBC: 16.8 10*3/uL — AB (ref 4.0–10.5)
nRBC: 0 % (ref 0.0–0.2)

## 2018-03-19 LAB — GLUCOSE, CAPILLARY: Glucose-Capillary: 128 mg/dL — ABNORMAL HIGH (ref 70–99)

## 2018-03-19 LAB — MAGNESIUM: Magnesium: 2 mg/dL (ref 1.7–2.4)

## 2018-03-19 MED ORDER — INSULIN ASPART 100 UNIT/ML ~~LOC~~ SOLN: SUBCUTANEOUS | 0 refills | Status: DC

## 2018-03-19 MED ORDER — PROMETHAZINE HCL 25 MG RE SUPP: 25.0000 mg | Freq: Four times a day (QID) | RECTAL | 1 refills | Status: DC | PRN

## 2018-03-19 MED ORDER — METOCLOPRAMIDE HCL 5 MG PO TABS: 5.0000 mg | ORAL_TABLET | Freq: Three times a day (TID) | ORAL | 0 refills | Status: DC

## 2018-03-19 NOTE — Discharge Instructions (Signed)
Follow with Primary MD [name], PA-C in 7 days   Get CBC, CMP checked  by Primary MD  in 5-7 days    Activity: As tolerated with Full fall precautions use walker/cane & assistance as needed  Disposition Home    Diet: Heart Healthy  Low Carb  Accuchecks 4 times/day, Once in AM empty stomach and then before each meal. Log in all results and show them to your Prim.MD in 3 days. If any glucose reading is under 80 or above 300 call your Prim MD immidiately. Follow Low glucose instructions for glucose under 80 as instructed.  Special Instructions: If you have smoked or chewed Tobacco  in the last 2 yrs please stop smoking, stop any regular Alcohol  and or any Recreational drug use.  On your next visit with your primary care physician please Get Medicines reviewed and adjusted.  Please request your Prim.MD to go over all Hospital Tests and Procedure/Radiological results at the follow up, please get all Hospital records sent to your Prim MD by signing hospital release before you go home.  If you experience worsening of your admission symptoms, develop shortness of breath, life threatening emergency, suicidal or homicidal thoughts you must seek medical attention immediately by calling 911 or calling your MD immediately  if symptoms less severe.  You Must read complete instructions/literature along with all the possible adverse reactions/side effects for all the Medicines you take and that have been prescribed to you. Take any new Medicines after you have completely understood and accpet all the possible adverse reactions/side effects.

## 2018-03-19 NOTE — Progress Notes (Signed)
03/19/18  1010  Reviewed discharge instructions with patient. Patient verbalized understanding of discharge instructions. Copy of discharge instructions and prescriptions given to patient.

## 2018-03-19 NOTE — Plan of Care (Signed)
  Problem: Nutrition: Goal: Adequate nutrition will be maintained Outcome: Progressing   Problem: Elimination: Goal: Will not experience complications related to bowel motility Outcome: Progressing   Problem: Safety: Goal: Ability to remain free from injury will improve Outcome: Progressing   

## 2018-03-19 NOTE — Discharge Summary (Signed)
[name] [name] ZOX:[id number] DOB: 31-Oct-1970 DOA: 03/16/2018  PCP: [name], PA-C  Admit date: 03/16/2018  Discharge date: 03/19/2018  Admitted From: Home   Disposition:  Home   Recommendations for Outpatient Follow-up:   Follow up with PCP in 1-2 weeks  PCP Please obtain BMP/CBC, 2 view CXR in 1week,  (see Discharge instructions)   PCP Please follow up on the following pending results: Monitor glycemic control   Home Health: None Equipment/Devices: None Consultations: None Discharge Condition: Fair CODE STATUS: Full Diet Recommendation: Heart Healthy low carbohydrate    Chief Complaint  Patient presents with  . Abdominal Pain     Brief history of present illness from the day of admission and additional interim summary    47 year old gentleman with history of DM type II, OSA, hypertension, dyslipidemia, GERD, CAD, depression who came into the hospital with elevated blood sugars along with some epigastric abdominal pain nausea and vomiting, diagnosed with DKA and admitted to the hospital.                                                                 Hospital Course   1.  DKA in a patient with DM type II.  History of poor outpatient compliance and continues to be noncompliant A1c now 15 which is up from 13 few a months ago, he was treated with insulin drip and IV fluids per DKA protocol, DKA has resolved, has been transitioned to Lantus and sliding scale, discharged home on his home dose Lantus along with addition of sliding scale which was NovoLog/Humalog.  PCP to monitor A1c and glycemic control.  Patient strictly counseled on compliance.  2.  Dehydration and hypokalemia.    Resolved with IV fluids with potassium supplementation.  3.  OSA.  Does not wear CPAP.  Outpatient pulmonary  follow-up.  4.  CAD.  Continue combination of aspirin, statin, beta-blocker for secondary prevention.  No acute issues.  5. Dyslipidemia.  On statin continue.  6.  Severe diabetic gastroparesis.    Due to diabetic noncompliance received supportive care with Reglan and Phenergan, now back to baseline.  Discharge home with Reglan QA CN Phenergan as needed again counseled on diabetic compliance.   Discharge diagnosis     Active Problems:   DKA (diabetic ketoacidoses) [facility])    Discharge instructions    Discharge Instructions    Discharge instructions   Complete by:  As directed    Follow with Primary MD [name], PA-C in 7 days   Get CBC, CMP checked  by Primary MD  in 5-7 days    Activity: As tolerated with Full fall precautions use walker/cane & assistance as needed  Disposition Home    Diet: Heart Healthy  Low Carb  Accuchecks 4 times/day, Once in AM empty stomach and  then before each meal. Log in all results and show them to your Prim.MD in 3 days. If any glucose reading is under 80 or above 300 call your Prim MD immidiately. Follow Low glucose instructions for glucose under 80 as instructed.  Special Instructions: If you have smoked or chewed Tobacco  in the last 2 yrs please stop smoking, stop any regular Alcohol  and or any Recreational drug use.  On your next visit with your primary care physician please Get Medicines reviewed and adjusted.  Please request your Prim.MD to go over all Hospital Tests and Procedure/Radiological results at the follow up, please get all Hospital records sent to your Prim MD by signing hospital release before you go home.  If you experience worsening of your admission symptoms, develop shortness of breath, life threatening emergency, suicidal or homicidal thoughts you must seek medical attention immediately by calling 911 or calling your MD immediately  if symptoms less severe.  You Must read complete instructions/literature  along with all the possible adverse reactions/side effects for all the Medicines you take and that have been prescribed to you. Take any new Medicines after you have completely understood and accpet all the possible adverse reactions/side effects   Increase activity slowly   Complete by:  As directed       Discharge Medications   Allergies as of 03/19/2018      Reactions   Omeprazole Magnesium Swelling   Face swells, no breathing impairment   Esomeprazole Swelling   Face swells, no breathing impairment      Medication List    TAKE these medications   aspirin 81 MG tablet Take 81 mg by mouth daily.   atorvastatin 80 MG tablet Commonly known as:  LIPITOR Take 1 tablet (80 mg total) by mouth daily.   famotidine 20 MG tablet Commonly known as:  PEPCID Take 1 tablet (20 mg total) by mouth 2 (two) times daily.   hydrOXYzine 25 MG tablet Commonly known as:  ATARAX/VISTARIL Take 25 mg by mouth 3 (three) times daily as needed for anxiety.   insulin aspart 100 UNIT/ML injection Commonly known as:  novoLOG Before each meal 3 times a day, 140-199 - 2 units, 200-250 - 4 units, 251-299 - 6 units,  300-349 - 8 units,  350 or above 10 units. Dispense syringes and needles as needed, Ok to switch to PEN if approved. Substitute to any brand approved. DX DM2, Code E11.65   Insulin Glargine 100 UNIT/ML Solostar Pen Commonly known as:  LANTUS Inject 10 Units into the skin at bedtime. What changed:  how much to take   isosorbide mononitrate 30 MG 24 hr tablet Commonly known as:  IMDUR Take 0.5 tablets (15 mg total) by mouth daily.   metFORMIN 1000 MG tablet Commonly known as:  GLUCOPHAGE TAKE 1 Tablet  BY MOUTH TWICE DAILY WITH MEALS   metoCLOPramide 5 MG tablet Commonly known as:  REGLAN Take 1 tablet (5 mg total) by mouth 3 (three) times daily before meals.   metoprolol tartrate 50 MG tablet Commonly known as:  LOPRESSOR Take 1 tablet (50 mg total) by mouth 2 (two) times daily.     nitroGLYCERIN 0.4 MG SL tablet Commonly known as:  NITROSTAT Place 1 tablet (0.4 mg total) under the tongue every 5 (five) minutes as needed for chest pain.   promethazine 25 MG suppository Commonly known as:  PHENERGAN Place 1 suppository (25 mg total) rectally every 6 (six) hours as needed for nausea.  Follow-up Information    [name], PA-C. Schedule an appointment as soon as possible for a visit in 1 week(s).   Specialty:  Physician Assistant Contact information: [address] [place] [address] [phone]        Jonelle Sidle, MD .   Specialty:  Cardiology Contact information: [address] [place] [address] [phone]           Major procedures and Radiology Reports - PLEASE review detailed and final reports thoroughly  -      Dg Chest Portable 1 View  Result Date: 03/16/2018 CLINICAL DATA:  47 y/o  M; tachypnea and shortness of breath. EXAM: PORTABLE CHEST 1 VIEW COMPARISON:  06/13/2017 chest radiograph. FINDINGS: Stable heart size and mediastinal contours are within normal limits. Both lungs are clear. Chronic lower right anterolateral rib fractures. No acute osseous abnormality is evident. IMPRESSION: No active disease. Electronically Signed   By: Mitzi Hansen M.D.   On: 03/16/2018 06:39   Dg Abd Portable 1v  Result Date: 03/17/2018 CLINICAL DATA:  Nausea vomiting EXAM: PORTABLE ABDOMEN - 1 VIEW COMPARISON:  06/13/2017 FINDINGS: The bowel gas pattern is normal. No radio-opaque calculi or other significant radiographic abnormality are seen. IMPRESSION: Negative. Electronically Signed   By: Marlan Palau M.D.   On: 03/17/2018 09:46   US Abdomen Limited Ruq  Result Date: 03/16/2018 CLINICAL DATA:  Right upper quadrant abdominal pain EXAM: ULTRASOUND ABDOMEN LIMITED RIGHT UPPER QUADRANT COMPARISON:  05/24/2017 FINDINGS: Gallbladder: No gallstones or wall thickening visualized. No sonographic Murphy sign noted by  sonographer. Common bile duct: Diameter: Normal caliber, 5 mm Liver: Increased echotexture compatible with fatty infiltration. No focal abnormality or biliary ductal dilatation. Portal vein is patent on color Doppler imaging with normal direction of blood flow towards the liver. IMPRESSION: Fatty infiltration of the liver. No acute findings. Electronically Signed   By: Charlett Nose M.D.   On: 03/16/2018 08:09    Micro Results    Recent Results (from the past 240 hour(s))  MRSA PCR Screening     Status: None   Collection Time: 03/16/18  1:42 PM  Result Value Ref Range Status   MRSA by PCR NEGATIVE NEGATIVE Final    Comment:        The GeneXpert MRSA Assay (FDA approved for NASAL specimens only), is one component of a comprehensive MRSA colonization surveillance program. It is not intended to diagnose MRSA infection nor to guide or monitor treatment for MRSA infections. Performed at [facility], [address], [place], [place] [address]     Today   Subjective    Elion Hocker today has no headache,no chest abdominal pain,no new weakness tingling or numbness, feels much better wants to go home today.   Objective   Blood pressure 132/87, pulse 85, temperature 99 F (37.2 C), temperature source Oral, resp. rate (!) 24, height 5\' 8"  (1.727 m), weight 75.6 kg, SpO2 94 %.  No intake or output data in the 24 hours ending 03/19/18 0820  Exam Awake Alert, Oriented x 3, No new F.N deficits, Normal affect [place].AT,PERRAL Supple Neck,No JVD, No cervical lymphadenopathy appriciated.  Symmetrical Chest wall movement, Good air movement bilaterally, CTAB RRR,No Gallops,Rubs or new Murmurs, No Parasternal Heave +ve B.Sounds, Abd Soft, Non tender, No organomegaly appriciated, No rebound -guarding or rigidity. No Cyanosis, Clubbing or edema, No new Rash or bruise   Data Review   CBC w Diff:  Lab Results  Component Value Date  WBC 16.8 (H) 03/19/2018   HGB 14.0 03/19/2018   HCT  43.5 03/19/2018   PLT 218 03/19/2018   LYMPHOPCT 21 08/22/2017   MONOPCT 3 08/22/2017   EOSPCT 2 08/22/2017   BASOPCT 0 08/22/2017    CMP:  Lab Results  Component Value Date   NA 138 03/19/2018   K 3.6 03/19/2018   CL 109 03/19/2018   CO2 21 (L) 03/19/2018   BUN 13 03/19/2018   CREATININE 0.95 03/19/2018   PROT 5.9 (L) 03/19/2018   ALBUMIN 2.8 (L) 03/19/2018   BILITOT 1.3 (H) 03/19/2018   ALKPHOS 81 03/19/2018   AST 12 (L) 03/19/2018   ALT 12 03/19/2018  . Lab Results  Component Value Date   HGBA1C 15.2 (H) 03/17/2018   CBG (last 3)  Recent Labs    03/18/18 1726 03/18/18 2203 03/19/18 0801  GLUCAP 104* 94 128*     Total Time in preparing paper work, data evaluation and todays exam - 35 minutes  Susa Raring M.D on 03/19/2018 at 8:20 AM  [facility]   Office  [phone]

## 2018-03-21 ENCOUNTER — Encounter (HOSPITAL_COMMUNITY): Payer: Self-pay | Admitting: Emergency Medicine

## 2018-03-21 ENCOUNTER — Inpatient Hospital Stay (HOSPITAL_COMMUNITY)
Admission: EM | Admit: 2018-03-21 | Discharge: 2018-03-25 | DRG: 074 | Disposition: A | Discharge status: Home / Self Care | Payer: Self-pay | Attending: Internal Medicine | Admitting: Internal Medicine

## 2018-03-21 ENCOUNTER — Other Ambulatory Visit: Payer: Self-pay

## 2018-03-21 DIAGNOSIS — J449 Chronic obstructive pulmonary disease, unspecified: Secondary | ICD-10-CM | POA: Diagnosis present

## 2018-03-21 DIAGNOSIS — G4730 Sleep apnea, unspecified: Secondary | ICD-10-CM | POA: Diagnosis present

## 2018-03-21 DIAGNOSIS — I2510 Atherosclerotic heart disease of native coronary artery without angina pectoris: Secondary | ICD-10-CM | POA: Diagnosis present

## 2018-03-21 DIAGNOSIS — Z794 Long term (current) use of insulin: Secondary | ICD-10-CM

## 2018-03-21 DIAGNOSIS — Z833 Family history of diabetes mellitus: Secondary | ICD-10-CM

## 2018-03-21 DIAGNOSIS — E876 Hypokalemia: Secondary | ICD-10-CM | POA: Diagnosis present

## 2018-03-21 DIAGNOSIS — I10 Essential (primary) hypertension: Secondary | ICD-10-CM | POA: Diagnosis present

## 2018-03-21 DIAGNOSIS — Z8249 Family history of ischemic heart disease and other diseases of the circulatory system: Secondary | ICD-10-CM

## 2018-03-21 DIAGNOSIS — R1013 Epigastric pain: Secondary | ICD-10-CM

## 2018-03-21 DIAGNOSIS — E0810 Diabetes mellitus due to underlying condition with ketoacidosis without coma: Secondary | ICD-10-CM

## 2018-03-21 DIAGNOSIS — E8729 Other acidosis: Secondary | ICD-10-CM | POA: Diagnosis present

## 2018-03-21 DIAGNOSIS — M109 Gout, unspecified: Secondary | ICD-10-CM | POA: Diagnosis present

## 2018-03-21 DIAGNOSIS — Z79899 Other long term (current) drug therapy: Secondary | ICD-10-CM

## 2018-03-21 DIAGNOSIS — E782 Mixed hyperlipidemia: Secondary | ICD-10-CM | POA: Diagnosis present

## 2018-03-21 DIAGNOSIS — E11649 Type 2 diabetes mellitus with hypoglycemia without coma: Secondary | ICD-10-CM | POA: Diagnosis not present

## 2018-03-21 DIAGNOSIS — F17210 Nicotine dependence, cigarettes, uncomplicated: Secondary | ICD-10-CM | POA: Diagnosis present

## 2018-03-21 DIAGNOSIS — E1143 Type 2 diabetes mellitus with diabetic autonomic (poly)neuropathy: Principal | ICD-10-CM | POA: Diagnosis present

## 2018-03-21 DIAGNOSIS — Z9111 Patient's noncompliance with dietary regimen: Secondary | ICD-10-CM

## 2018-03-21 DIAGNOSIS — F419 Anxiety disorder, unspecified: Secondary | ICD-10-CM | POA: Diagnosis present

## 2018-03-21 DIAGNOSIS — N179 Acute kidney failure, unspecified: Secondary | ICD-10-CM

## 2018-03-21 DIAGNOSIS — R112 Nausea with vomiting, unspecified: Secondary | ICD-10-CM

## 2018-03-21 DIAGNOSIS — K219 Gastro-esophageal reflux disease without esophagitis: Secondary | ICD-10-CM | POA: Diagnosis present

## 2018-03-21 DIAGNOSIS — F418 Other specified anxiety disorders: Secondary | ICD-10-CM | POA: Diagnosis present

## 2018-03-21 DIAGNOSIS — Z7982 Long term (current) use of aspirin: Secondary | ICD-10-CM

## 2018-03-21 DIAGNOSIS — E1142 Type 2 diabetes mellitus with diabetic polyneuropathy: Secondary | ICD-10-CM | POA: Diagnosis present

## 2018-03-21 DIAGNOSIS — F329 Major depressive disorder, single episode, unspecified: Secondary | ICD-10-CM | POA: Diagnosis present

## 2018-03-21 DIAGNOSIS — E785 Hyperlipidemia, unspecified: Secondary | ICD-10-CM | POA: Diagnosis present

## 2018-03-21 DIAGNOSIS — R197 Diarrhea, unspecified: Secondary | ICD-10-CM

## 2018-03-21 DIAGNOSIS — Z888 Allergy status to other drugs, medicaments and biological substances status: Secondary | ICD-10-CM

## 2018-03-21 DIAGNOSIS — E1165 Type 2 diabetes mellitus with hyperglycemia: Secondary | ICD-10-CM

## 2018-03-21 DIAGNOSIS — E860 Dehydration: Secondary | ICD-10-CM | POA: Diagnosis present

## 2018-03-21 DIAGNOSIS — D72829 Elevated white blood cell count, unspecified: Secondary | ICD-10-CM

## 2018-03-21 DIAGNOSIS — E872 Acidosis: Secondary | ICD-10-CM | POA: Diagnosis present

## 2018-03-21 DIAGNOSIS — K3184 Gastroparesis: Secondary | ICD-10-CM | POA: Diagnosis present

## 2018-03-21 LAB — CBC
HCT: 43.4 % (ref 39.0–52.0)
HEMOGLOBIN: 13.9 g/dL (ref 13.0–17.0)
MCH: 29.6 pg (ref 26.0–34.0)
MCHC: 32 g/dL (ref 30.0–36.0)
MCV: 92.5 fL (ref 80.0–100.0)
PLATELETS: 237 10*3/uL (ref 150–400)
RBC: 4.69 MIL/uL (ref 4.22–5.81)
RDW: 11.9 % (ref 11.5–15.5)
WBC: 15.7 10*3/uL — AB (ref 4.0–10.5)
nRBC: 0 % (ref 0.0–0.2)

## 2018-03-21 LAB — BASIC METABOLIC PANEL
Anion gap: 17 — ABNORMAL HIGH (ref 5–15)
BUN: 14 mg/dL (ref 6–20)
CO2: 13 mmol/L — ABNORMAL LOW (ref 22–32)
Calcium: 7.6 mg/dL — ABNORMAL LOW (ref 8.9–10.3)
Chloride: 108 mmol/L (ref 98–111)
Creatinine, Ser: 1.23 mg/dL (ref 0.61–1.24)
Glucose, Bld: 149 mg/dL — ABNORMAL HIGH (ref 70–99)
POTASSIUM: 3.9 mmol/L (ref 3.5–5.1)
Sodium: 138 mmol/L (ref 135–145)

## 2018-03-21 LAB — COMPREHENSIVE METABOLIC PANEL
ALBUMIN: 3.5 g/dL (ref 3.5–5.0)
ALT: 15 U/L (ref 0–44)
AST: 43 U/L — ABNORMAL HIGH (ref 15–41)
Alkaline Phosphatase: 103 U/L (ref 38–126)
Anion gap: 22 — ABNORMAL HIGH (ref 5–15)
BUN: 16 mg/dL (ref 6–20)
CO2: 16 mmol/L — AB (ref 22–32)
Calcium: 8.9 mg/dL (ref 8.9–10.3)
Chloride: 99 mmol/L (ref 98–111)
Creatinine, Ser: 1.35 mg/dL — ABNORMAL HIGH (ref 0.61–1.24)
GFR calc Af Amer: >60 mL/min (ref >60)
GFR calc non Af Amer: >60 mL/min (ref >60)
GLUCOSE: 124 mg/dL — AB (ref 70–99)
POTASSIUM: 5.2 mmol/L — AB (ref 3.5–5.1)
SODIUM: 137 mmol/L (ref 135–145)
Total Bilirubin: 3.2 mg/dL — ABNORMAL HIGH (ref 0.3–1.2)
Total Protein: 7 g/dL (ref 6.5–8.1)

## 2018-03-21 LAB — I-STAT TROPONIN, ED: Troponin i, poc: 0.02 ng/mL (ref 0.00–0.08)

## 2018-03-21 LAB — LIPASE, BLOOD: Lipase: 18 U/L (ref 11–51)

## 2018-03-21 MED ORDER — SODIUM CHLORIDE 0.9 % IV BOLUS
1000.0000 mL | Freq: Once | INTRAVENOUS | Status: AC
  Administered 2018-03-21: 1000 mL via INTRAVENOUS

## 2018-03-21 MED ORDER — DEXTROSE-NACL 5-0.45 % IV SOLN
INTRAVENOUS | Status: DC
  Administered 2018-03-22 (×2): via INTRAVENOUS

## 2018-03-21 MED ORDER — METOCLOPRAMIDE HCL 5 MG/ML IJ SOLN
10.0000 mg | Freq: Once | INTRAMUSCULAR | Status: AC
  Administered 2018-03-21: 10 mg via INTRAVENOUS
  Filled 2018-03-21: qty 2

## 2018-03-21 MED ORDER — INSULIN ASPART 100 UNIT/ML ~~LOC~~ SOLN
4.0000 [IU] | Freq: Once | SUBCUTANEOUS | Status: AC
  Administered 2018-03-22: 4 [IU] via INTRAVENOUS
  Filled 2018-03-21: qty 1

## 2018-03-21 MED ORDER — ALUM & MAG HYDROXIDE-SIMETH 200-200-20 MG/5ML PO SUSP
30.0000 mL | Freq: Once | ORAL | Status: AC
  Administered 2018-03-21: 30 mL via ORAL
  Filled 2018-03-21: qty 30

## 2018-03-21 MED ORDER — INSULIN REGULAR(HUMAN) IN NACL 100-0.9 UT/100ML-% IV SOLN
INTRAVENOUS | Status: DC
  Administered 2018-03-22: 100 [IU] via INTRAVENOUS
  Filled 2018-03-21: qty 100

## 2018-03-21 MED ORDER — HYDROMORPHONE HCL 1 MG/ML IJ SOLN
1.0000 mg | Freq: Once | INTRAMUSCULAR | Status: AC
  Administered 2018-03-21: 1 mg via INTRAVENOUS
  Filled 2018-03-21: qty 1

## 2018-03-21 MED ORDER — FAMOTIDINE IN NACL 20-0.9 MG/50ML-% IV SOLN
20.0000 mg | Freq: Once | INTRAVENOUS | Status: AC
  Administered 2018-03-21: 20 mg via INTRAVENOUS
  Filled 2018-03-21: qty 50

## 2018-03-21 NOTE — ED Triage Notes (Signed)
Pt BIB [facility], c/o nausea/vomiting x 5 days. Hx diabetes, EMS CBG 121. Recent admission for DKA, did not have discharge prescriptions filled, and is requesting re-evaluation. Given 4mg  zofran IV PTA. C/o continued nausea/vomiting.

## 2018-03-21 NOTE — ED Notes (Signed)
Per MD, draw labs after IV fluids complete.

## 2018-03-21 NOTE — ED Provider Notes (Signed)
[facility] EMERGENCY DEPARTMENT Provider Note   CSN: [id number] Arrival date & time: 03/21/18  1935     History   Chief Complaint Chief Complaint  Patient presents with  . Emesis    HPI [name] [name] is a 47 y.o. male.  Patient c/o epigastric and lower chest pain associated with nausea and vomiting. Pain moderate, constant, persistent, non radiating. Symptoms present for past 4-5 days - recent admit with similar symptoms, gastroparesis w n/v, and dka. Pt denies abd distension. Is having normal bms. No fever or chills.   The history is provided by the patient.  Emesis   Associated symptoms include abdominal pain. Pertinent negatives include no cough, no fever and no headaches.    Past Medical History:  Diagnosis Date  . Anxiety   . Arthritis    "hands & feet" (03/16/2018)  . CAD (coronary artery disease)    Moderate LAD disease 2016 - Dr. Einar Gip  . Chronic bronchitis ([place])   . Chronic upper back pain   . COPD (chronic obstructive pulmonary disease) ([place])   . Daily headache    (03/16/2018)  . Depression   . Diabetic peripheral neuropathy ([place])   . DKA, type 2 ([place])   . Family history of adverse reaction to anesthesia    "my dad didn't go to sleep; he couldn't move but was awake during colonoscopy" (03/16/2018)  . GERD (gastroesophageal reflux disease)   . History of gout   . Hyperlipemia   . Hypertension   . Migraine    "weekly" (03/16/2018)  . 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 Sleep apnea 2016   "insurance wouldn't pay for mask" (03/16/2018)  . Type 2 diabetes mellitus [facility])     Patient Active Problem List   Diagnosis Date Noted  . CAD (coronary artery disease) 02/15/2018  . Diabetes mellitus ([place]) 02/15/2018  . Hyperlipidemia 02/15/2018  . Tobacco abuse 02/15/2018  . Diabetic gastroparesis ([place]) 06/02/2017  . ARF (acute renal failure) ([place]) 05/30/2017  . DKA (diabetic ketoacidoses) ([place]) 10/15/2016  . Gastroesophageal reflux  disease 10/15/2016  . Anxiety with depression 10/15/2016  . HTN (hypertension) 10/15/2016  . Intractable nausea and vomiting 11/25/2015  . Dental abscess 11/25/2015  . Nausea and vomiting 11/25/2015  . Chest pain 03/27/2015    Past Surgical History:  Procedure Laterality Date  . ANKLE SURGERY Right 1982   "had extra bones in there; took them out"  . APPENDECTOMY  1975  . CARDIAC CATHETERIZATION N/A 03/28/2015   Procedure: Left Heart Cath and Coronary Angiography;  Surgeon: Adrian Prows, MD;  Location: [place] [facility];  Service: Cardiovascular;  Laterality: N/A;  . CARDIAC CATHETERIZATION N/A 03/28/2015   Procedure: Intravascular Pressure Wire/FFR Study;  Surgeon: Adrian Prows, MD;  Location: [place] [facility];  Service: Cardiovascular;  Laterality: N/A;  . CARPAL TUNNEL RELEASE Left ~ 2008  . COLONOSCOPY WITH ESOPHAGOGASTRODUODENOSCOPY (EGD)    . ELBOW FRACTURE SURGERY Left ~ 2008  . FRACTURE SURGERY    . PILONIDAL CYST EXCISION N/A 03/24/2017   Procedure: EXCISION CHRONIC  PILONIDAL ABSCESS;  Surgeon: Coralie Keens, MD;  Location: [facility] OR[facility];  Service: General;  Laterality: N/A;  . TENDON REPAIR Left ~ 2004   "main tendon in my ankle"        Home Medications    Prior to Admission medications   Medication Sig Start Date End Date Taking? Authorizing Provider  aspirin 81 MG tablet Take 81 mg by mouth daily.  [provider]  atorvastatin (LIPITOR) 80 MG tablet Take 1 tablet (80 mg total) by mouth daily. 01/12/18   Satira Sark, MD  famotidine (PEPCID) 20 MG tablet Take 1 tablet (20 mg total) by mouth 2 (two) times daily. 03/06/18   Soyla Dryer, PA-C  hydrOXYzine (ATARAX/VISTARIL) 25 MG tablet Take 25 mg by mouth 3 (three) times daily as needed for anxiety.     [provider]  insulin aspart (NOVOLOG) 100 UNIT/ML injection Before each meal 3 times a day, 140-199 - 2 units, 200-250 - 4 units, 251-299 - 6 units,  300-349 - 8 units,  350 or above 10 units.  Dispense syringes and needles as needed, Ok to switch to PEN if approved. Substitute to any brand approved. DX DM2, Code E11.65 03/19/18   Thurnell Lose, MD  Insulin Glargine (LANTUS SOLOSTAR) 100 UNIT/ML Solostar Pen Inject 10 Units into the skin at bedtime. Patient taking differently: Inject 40 Units into the skin at bedtime.  09/05/17   Soyla Dryer, PA-C  isosorbide mononitrate (IMDUR) 30 MG 24 hr tablet Take 0.5 tablets (15 mg total) by mouth daily. 01/12/18 04/12/18  Satira Sark, MD  metFORMIN (GLUCOPHAGE) 1000 MG tablet TAKE 1 Tablet  BY MOUTH TWICE DAILY WITH MEALS 09/20/17   Soyla Dryer, PA-C  metoCLOPramide (REGLAN) 5 MG tablet Take 1 tablet (5 mg total) by mouth 3 (three) times daily before meals. 03/19/18 04/18/18  Thurnell Lose, MD  metoprolol tartrate (LOPRESSOR) 50 MG tablet Take 1 tablet (50 mg total) by mouth 2 (two) times daily. 02/20/18 05/21/18  Imogene Burn, PA-C  nitroGLYCERIN (NITROSTAT) 0.4 MG SL tablet Place 1 tablet (0.4 mg total) under the tongue every 5 (five) minutes as needed for chest pain. 10/05/17   Soyla Dryer, PA-C  promethazine (PHENERGAN) 25 MG suppository Place 1 suppository (25 mg total) rectally every 6 (six) hours as needed for nausea. 03/19/18 03/19/19  Thurnell Lose, MD    Family History Family History  Problem Relation Age of Onset  . Congestive Heart Failure Sister   . Diabetes Mother   . Hypertension Mother   . Diabetes Father   . Hypertension Father     Social History Social History   Tobacco Use  . Smoking status: Current Every Day Smoker    Packs/day: 0.50    Years: 34.00    Pack years: 17.00    Types: Cigarettes  . Smokeless tobacco: Never Used  Substance Use Topics  . Alcohol use: Never    Alcohol/week: 0.0 standard drinks    Frequency: Never  . Drug use: Never     Allergies   Omeprazole magnesium and Esomeprazole   Review of Systems Review of Systems  Constitutional: Negative for fever.    HENT: Negative for sore throat.   Eyes: Negative for redness.  Respiratory: Negative for cough and shortness of breath.   Cardiovascular: Positive for chest pain. Negative for leg swelling.  Gastrointestinal: Positive for abdominal pain, nausea and vomiting.  Endocrine: Negative for polyuria.  Genitourinary: Negative for dysuria and flank pain.  Musculoskeletal: Negative for back pain and neck pain.  Skin: Negative for rash.  Neurological: Negative for headaches.  Hematological: Does not bruise/bleed easily.  Psychiatric/Behavioral: Negative for confusion.     Physical Exam Updated Vital Signs There were no vitals taken for this visit.  Physical Exam  Constitutional: He appears well-developed and well-nourished.  HENT:  Mouth/Throat: Oropharynx is clear and moist.  Eyes: Conjunctivae are normal. No  scleral icterus.  Neck: Neck supple. No tracheal deviation present.  Cardiovascular: Regular rhythm, normal heart sounds and intact distal pulses. Exam reveals no gallop and no friction rub.  No murmur heard. Pulmonary/Chest: Effort normal and breath sounds normal. No accessory muscle usage. No respiratory distress.  Abdominal: Soft. Bowel sounds are normal. He exhibits no distension and no mass. There is no tenderness. There is no rebound and no guarding. No hernia.  Genitourinary:  Genitourinary Comments: No cva tenderness.   Musculoskeletal: He exhibits no edema.  Neurological: He is alert.  Speech clear/fluent.   Skin: Skin is warm and dry. No rash noted.  Psychiatric:  Anxious appearing.   Nursing note and vitals reviewed.    ED Treatments / Results  Labs (all labs ordered are listed, but only abnormal results are displayed) Results for orders placed or performed during the hospital encounter of 03/21/18  Comprehensive metabolic panel  Result Value Ref Range   Sodium 137 135 - 145 mmol/L   Potassium 5.2 (H) 3.5 - 5.1 mmol/L   Chloride 99 98 - 111 mmol/L   CO2 16 (L)  22 - 32 mmol/L   Glucose, Bld 124 (H) 70 - 99 mg/dL   BUN 16 6 - 20 mg/dL   Creatinine, Ser 1.35 (H) 0.61 - 1.24 mg/dL   Calcium 8.9 8.9 - 10.3 mg/dL   Total Protein 7.0 6.5 - 8.1 g/dL   Albumin 3.5 3.5 - 5.0 g/dL   AST 43 (H) 15 - 41 U/L   ALT 15 0 - 44 U/L   Alkaline Phosphatase 103 38 - 126 U/L   Total Bilirubin 3.2 (H) 0.3 - 1.2 mg/dL   GFR calc non Af Amer >60 >60 mL/min   GFR calc Af Amer >60 >60 mL/min   Anion gap 22 (H) 5 - 15  Lipase, blood  Result Value Ref Range   Lipase 18 11 - 51 U/L  CBC  Result Value Ref Range   WBC 15.7 (H) 4.0 - 10.5 K/uL   RBC 4.69 4.22 - 5.81 MIL/uL   Hemoglobin 13.9 13.0 - 17.0 g/dL   HCT 43.4 39.0 - 52.0 %   MCV 92.5 80.0 - 100.0 fL   MCH 29.6 26.0 - 34.0 pg   MCHC 32.0 30.0 - 36.0 g/dL   RDW 11.9 11.5 - 15.5 %   Platelets 237 150 - 400 K/uL   nRBC 0.0 0.0 - 0.2 %  Basic metabolic panel  Result Value Ref Range   Sodium 138 135 - 145 mmol/L   Potassium 3.9 3.5 - 5.1 mmol/L   Chloride 108 98 - 111 mmol/L   CO2 13 (L) 22 - 32 mmol/L   Glucose, Bld 149 (H) 70 - 99 mg/dL   BUN 14 6 - 20 mg/dL   Creatinine, Ser 1.23 0.61 - 1.24 mg/dL   Calcium 7.6 (L) 8.9 - 10.3 mg/dL   GFR calc non Af Amer >60 >60 mL/min   GFR calc Af Amer >60 >60 mL/min   Anion gap 17 (H) 5 - 15  I-stat troponin, ED  Result Value Ref Range   Troponin i, poc 0.02 0.00 - 0.08 ng/mL   Comment 3           Dg Chest Portable 1 View  Result Date: 03/16/2018 CLINICAL DATA:  47 y/o  M; tachypnea and shortness of breath. EXAM: PORTABLE CHEST 1 VIEW COMPARISON:  06/13/2017 chest radiograph. FINDINGS: Stable heart size and mediastinal contours are within normal limits. Both lungs  are clear. Chronic lower right anterolateral rib fractures. No acute osseous abnormality is evident. IMPRESSION: No active disease. Electronically Signed   By: Kristine Garbe M.D.   On: 03/16/2018 06:39   Dg Abd Portable 1v  Result Date: 03/17/2018 CLINICAL DATA:  Nausea vomiting EXAM:  PORTABLE ABDOMEN - 1 VIEW COMPARISON:  06/13/2017 FINDINGS: The bowel gas pattern is normal. No radio-opaque calculi or other significant radiographic abnormality are seen. IMPRESSION: Negative. Electronically Signed   By: Franchot Gallo M.D.   On: 03/17/2018 09:46   US Abdomen Limited Ruq  Result Date: 03/16/2018 CLINICAL DATA:  Right upper quadrant abdominal pain EXAM: ULTRASOUND ABDOMEN LIMITED RIGHT UPPER QUADRANT COMPARISON:  05/24/2017 FINDINGS: Gallbladder: No gallstones or wall thickening visualized. No sonographic Murphy sign noted by sonographer. Common bile duct: Diameter: Normal caliber, 5 mm Liver: Increased echotexture compatible with fatty infiltration. No focal abnormality or biliary ductal dilatation. Portal vein is patent on color Doppler imaging with normal direction of blood flow towards the liver. IMPRESSION: Fatty infiltration of the liver. No acute findings. Electronically Signed   By: Rolm Baptise M.D.   On: 03/16/2018 08:09    EKG None  Radiology No results found.  Procedures Procedures (including critical care time)  Medications Ordered in ED Medications  sodium chloride 0.9 % bolus 1,000 mL (has no administration in time range)  metoCLOPramide (REGLAN) injection 10 mg (has no administration in time range)  famotidine (PEPCID) IVPB 20 mg premix (has no administration in time range)  HYDROmorphone (DILAUDID) injection 1 mg (has no administration in time range)     Initial Impression / Assessment and Plan / ED Course  I have reviewed the triage vital signs and the nursing notes.  Pertinent labs & imaging results that were available during my care of the patient were reviewed by me and considered in my medical decision making (see chart for details).  Iv ns bolus. reglan iv. pepcid iv. Labs sent.   Dilaudid 1 mg iv for pain.  Reviewed nursing notes and prior charts for additional history.   Additional ns bolus.   Labs reviewed - anion gap met acid,  however glucose normal. dka can be seen without signif elev glucose. Insulin, additional fluids. Repeat bmet.   Repeat bmet with anion gap increased from initial. Insulin gtt via glucostabilizer, ivf.  k normal, so in setting met acid/dka, will start k iv.   Hospitalist consulted for admission.  CRITICAL CARE elevated anion gap metabolic acidosis, dka, severe dehydration.  Performed by: Mirna Mires Total critical care time: 40 minutes Critical care time was exclusive of separately billable procedures and treating other patients. Critical care was necessary to treat or prevent imminent or life-threatening deterioration. Critical care was time spent personally by me on the following activities: development of treatment plan with patient and/or surrogate as well as nursing, discussions with consultants, evaluation of patient's response to treatment, examination of patient, obtaining history from patient or surrogate, ordering and performing treatments and interventions, ordering and review of laboratory studies, ordering and review of radiographic studies, pulse oximetry and re-evaluation of patient's condition.   Final Clinical Impressions(s) / ED Diagnoses   Final diagnoses:  None    ED Discharge Orders    None       Lajean Saver, MD 03/22/18 0001

## 2018-03-21 NOTE — Discharge Instructions (Addendum)
Follow with Primary MD [name], PA-C in 7 days   Get CBC, CMP, 2 view Chest X ray checked  by Primary MD next visit.    Activity: As tolerated with Full fall precautions use walker/cane & assistance as needed   Disposition Home    Diet: Heart Healthy , carbohydrate modified diet, with feeding assistance and aspiration precautions   On your next visit with your primary care physician please Get Medicines reviewed and adjusted.   Please request your Prim.MD to go over all Hospital Tests and Procedure/Radiological results at the follow up, please get all Hospital records sent to your Prim MD by signing hospital release before you go home.   If you experience worsening of your admission symptoms, develop shortness of breath, life threatening emergency, suicidal or homicidal thoughts you must seek medical attention immediately by calling 911 or calling your MD immediately  if symptoms less severe.  You Must read complete instructions/literature along with all the possible adverse reactions/side effects for all the Medicines you take and that have been prescribed to you. Take any new Medicines after you have completely understood and accpet all the possible adverse reactions/side effects.   Do not drive, operating heavy machinery, perform activities at heights, swimming or participation in water activities or provide baby sitting services if your were admitted for syncope or siezures until you have seen by Primary MD or a Neurologist and advised to do so again.  Do not drive when taking Pain medications.    Do not take more than prescribed Pain, Sleep and Anxiety Medications  Special Instructions: If you have smoked or chewed Tobacco  in the last 2 yrs please stop smoking, stop any regular Alcohol  and or any Recreational drug use.  Wear Seat belts while driving.   Please note  You were cared for by a hospitalist during your hospital stay. If you have any questions about your  discharge medications or the care you received while you were in the hospital after you are discharged, you can call the unit and asked to speak with the hospitalist on call if the hospitalist that took care of you is not available. Once you are discharged, your primary care physician will handle any further medical issues. Please note that NO REFILLS for any discharge medications will be authorized once you are discharged, as it is imperative that you return to your primary care physician (or establish a relationship with a primary care physician if you do not have one) for your aftercare needs so that they can reassess your need for medications and monitor your lab values.

## 2018-03-22 ENCOUNTER — Other Ambulatory Visit: Payer: Self-pay

## 2018-03-22 ENCOUNTER — Observation Stay (HOSPITAL_COMMUNITY): Payer: Medicaid Other

## 2018-03-22 DIAGNOSIS — R1013 Epigastric pain: Secondary | ICD-10-CM

## 2018-03-22 DIAGNOSIS — R197 Diarrhea, unspecified: Secondary | ICD-10-CM

## 2018-03-22 DIAGNOSIS — N179 Acute kidney failure, unspecified: Secondary | ICD-10-CM

## 2018-03-22 DIAGNOSIS — E8729 Other acidosis: Secondary | ICD-10-CM | POA: Diagnosis present

## 2018-03-22 DIAGNOSIS — E872 Acidosis: Secondary | ICD-10-CM

## 2018-03-22 DIAGNOSIS — R112 Nausea with vomiting, unspecified: Secondary | ICD-10-CM

## 2018-03-22 DIAGNOSIS — E1165 Type 2 diabetes mellitus with hyperglycemia: Secondary | ICD-10-CM

## 2018-03-22 LAB — COMPREHENSIVE METABOLIC PANEL
ALBUMIN: 2.9 g/dL — AB (ref 3.5–5.0)
ALK PHOS: 76 U/L (ref 38–126)
ALT: 11 U/L (ref 0–44)
ALT: 10 U/L (ref 0–44)
AST: 15 U/L (ref 15–41)
AST: 15 U/L (ref 15–41)
Albumin: 2.6 g/dL — ABNORMAL LOW (ref 3.5–5.0)
Alkaline Phosphatase: 88 U/L (ref 38–126)
Anion gap: 15 (ref 5–15)
Anion gap: 12 (ref 5–15)
BILIRUBIN TOTAL: 1.3 mg/dL — AB (ref 0.3–1.2)
BUN: 11 mg/dL (ref 6–20)
BUN: 6 mg/dL (ref 6–20)
CALCIUM: 7.8 mg/dL — AB (ref 8.9–10.3)
CALCIUM: 8.3 mg/dL — AB (ref 8.9–10.3)
CO2: 15 mmol/L — ABNORMAL LOW (ref 22–32)
CO2: 20 mmol/L — ABNORMAL LOW (ref 22–32)
CREATININE: 1.28 mg/dL — AB (ref 0.61–1.24)
CREATININE: 0.88 mg/dL (ref 0.61–1.24)
Chloride: 107 mmol/L (ref 98–111)
Chloride: 108 mmol/L (ref 98–111)
GFR calc Af Amer: >60 mL/min (ref >60)
GFR calc non Af Amer: >60 mL/min (ref >60)
GLUCOSE: 105 mg/dL — AB (ref 70–99)
Glucose, Bld: 182 mg/dL — ABNORMAL HIGH (ref 70–99)
Potassium: 4 mmol/L (ref 3.5–5.1)
Potassium: 3.5 mmol/L (ref 3.5–5.1)
Sodium: 137 mmol/L (ref 135–145)
Sodium: 140 mmol/L (ref 135–145)
TOTAL PROTEIN: 6.3 g/dL — AB (ref 6.5–8.1)
Total Bilirubin: 1.4 mg/dL — ABNORMAL HIGH (ref 0.3–1.2)
Total Protein: 5.5 g/dL — ABNORMAL LOW (ref 6.5–8.1)

## 2018-03-22 LAB — URINALYSIS, ROUTINE W REFLEX MICROSCOPIC
Bilirubin Urine: NEGATIVE
GLUCOSE, UA: NEGATIVE mg/dL
KETONES UR: 80 mg/dL — AB
LEUKOCYTES UA: NEGATIVE
NITRITE: NEGATIVE
PROTEIN: 30 mg/dL — AB
Specific Gravity, Urine: 1.014 (ref 1.005–1.030)
pH: 5 (ref 5.0–8.0)

## 2018-03-22 LAB — CBG MONITORING, ED
GLUCOSE-CAPILLARY: 107 mg/dL — AB (ref 70–99)
GLUCOSE-CAPILLARY: 139 mg/dL — AB (ref 70–99)
GLUCOSE-CAPILLARY: 194 mg/dL — AB (ref 70–99)
GLUCOSE-CAPILLARY: 204 mg/dL — AB (ref 70–99)
Glucose-Capillary: 96 mg/dL (ref 70–99)
Glucose-Capillary: 140 mg/dL — ABNORMAL HIGH (ref 70–99)
Glucose-Capillary: 146 mg/dL — ABNORMAL HIGH (ref 70–99)
Glucose-Capillary: 168 mg/dL — ABNORMAL HIGH (ref 70–99)
Glucose-Capillary: 163 mg/dL — ABNORMAL HIGH (ref 70–99)
Glucose-Capillary: 202 mg/dL — ABNORMAL HIGH (ref 70–99)
Glucose-Capillary: 130 mg/dL — ABNORMAL HIGH (ref 70–99)
Glucose-Capillary: 105 mg/dL — ABNORMAL HIGH (ref 70–99)

## 2018-03-22 LAB — BASIC METABOLIC PANEL
ANION GAP: 14 (ref 5–15)
BUN: 13 mg/dL (ref 6–20)
CALCIUM: 7.9 mg/dL — AB (ref 8.9–10.3)
CHLORIDE: 108 mmol/L (ref 98–111)
CO2: 17 mmol/L — AB (ref 22–32)
Creatinine, Ser: 1.19 mg/dL (ref 0.61–1.24)
GFR calc Af Amer: >60 mL/min (ref >60)
GFR calc non Af Amer: >60 mL/min (ref >60)
GLUCOSE: 107 mg/dL — AB (ref 70–99)
Potassium: 3.8 mmol/L (ref 3.5–5.1)
Sodium: 139 mmol/L (ref 135–145)

## 2018-03-22 LAB — GLUCOSE, CAPILLARY
GLUCOSE-CAPILLARY: 150 mg/dL — AB (ref 70–99)
Glucose-Capillary: 96 mg/dL (ref 70–99)
Glucose-Capillary: 102 mg/dL — ABNORMAL HIGH (ref 70–99)
Glucose-Capillary: 115 mg/dL — ABNORMAL HIGH (ref 70–99)

## 2018-03-22 LAB — CBC
HEMATOCRIT: 42.9 % (ref 39.0–52.0)
Hemoglobin: 13.9 g/dL (ref 13.0–17.0)
MCH: 29.7 pg (ref 26.0–34.0)
MCHC: 32.4 g/dL (ref 30.0–36.0)
MCV: 91.7 fL (ref 80.0–100.0)
NRBC: 0 % (ref 0.0–0.2)
Platelets: 253 10*3/uL (ref 150–400)
RBC: 4.68 MIL/uL (ref 4.22–5.81)
RDW: 12 % (ref 11.5–15.5)
WBC: 14.5 10*3/uL — ABNORMAL HIGH (ref 4.0–10.5)

## 2018-03-22 LAB — I-STAT ARTERIAL BLOOD GAS, ED
ACID-BASE DEFICIT: 8 mmol/L — AB (ref 0.0–2.0)
BICARBONATE: 15.8 mmol/L — AB (ref 20.0–28.0)
O2 SAT: 97 %
PH ART: 7.365 (ref 7.350–7.450)
PO2 ART: 96 mmHg (ref 83.0–108.0)
TCO2: 17 mmol/L — ABNORMAL LOW (ref 22–32)
pCO2 arterial: 27.6 mmHg — ABNORMAL LOW (ref 32.0–48.0)

## 2018-03-22 LAB — MRSA PCR SCREENING: MRSA BY PCR: NEGATIVE

## 2018-03-22 LAB — LACTIC ACID, PLASMA: LACTIC ACID, VENOUS: 1 mmol/L (ref 0.5–1.9)

## 2018-03-22 MED ORDER — METOCLOPRAMIDE HCL 10 MG PO TABS
5.0000 mg | ORAL_TABLET | Freq: Three times a day (TID) | ORAL | Status: DC
  Administered 2018-03-22 – 2018-03-23 (×4): 5 mg via ORAL
  Filled 2018-03-22 (×4): qty 1

## 2018-03-22 MED ORDER — SODIUM CHLORIDE 0.9 % IV SOLN: INTRAVENOUS | Status: DC

## 2018-03-22 MED ORDER — SODIUM CHLORIDE 0.9 % IV SOLN
INTRAVENOUS | Status: DC
  Administered 2018-03-22 – 2018-03-23 (×3): via INTRAVENOUS

## 2018-03-22 MED ORDER — ASPIRIN 81 MG PO CHEW: 81.0000 mg | CHEWABLE_TABLET | Freq: Every day | ORAL | Status: DC

## 2018-03-22 MED ORDER — ATORVASTATIN CALCIUM 80 MG PO TABS
80.0000 mg | ORAL_TABLET | Freq: Every day | ORAL | Status: DC
  Administered 2018-03-22 – 2018-03-25 (×4): 80 mg via ORAL
  Filled 2018-03-22: qty 1
  Filled 2018-03-22: qty 4
  Filled 2018-03-22 (×2): qty 1

## 2018-03-22 MED ORDER — FAMOTIDINE 20 MG PO TABS
20.0000 mg | ORAL_TABLET | Freq: Two times a day (BID) | ORAL | Status: DC
  Administered 2018-03-22 – 2018-03-25 (×8): 20 mg via ORAL
  Filled 2018-03-22 (×8): qty 1

## 2018-03-22 MED ORDER — INSULIN ASPART 100 UNIT/ML ~~LOC~~ SOLN
0.0000 [IU] | Freq: Three times a day (TID) | SUBCUTANEOUS | Status: DC
  Administered 2018-03-24 (×2): 2 [IU] via SUBCUTANEOUS
  Administered 2018-03-25: 3 [IU] via SUBCUTANEOUS
  Administered 2018-03-25: 1 [IU] via SUBCUTANEOUS

## 2018-03-22 MED ORDER — INSULIN GLARGINE 100 UNIT/ML ~~LOC~~ SOLN
25.0000 [IU] | Freq: Every day | SUBCUTANEOUS | Status: DC
  Administered 2018-03-22 – 2018-03-23 (×2): 25 [IU] via SUBCUTANEOUS
  Filled 2018-03-22 (×2): qty 0.25

## 2018-03-22 MED ORDER — ENOXAPARIN SODIUM 40 MG/0.4ML ~~LOC~~ SOLN
40.0000 mg | SUBCUTANEOUS | Status: DC
  Administered 2018-03-22 – 2018-03-25 (×4): 40 mg via SUBCUTANEOUS
  Filled 2018-03-22 (×4): qty 0.4

## 2018-03-22 MED ORDER — BOOST / RESOURCE BREEZE PO LIQD CUSTOM
1.0000 | Freq: Three times a day (TID) | ORAL | Status: DC
  Administered 2018-03-22: 1 via ORAL

## 2018-03-22 MED ORDER — SODIUM CHLORIDE 0.9 % IV BOLUS
1000.0000 mL | INTRAVENOUS | Status: AC
  Administered 2018-03-22: 1000 mL via INTRAVENOUS

## 2018-03-22 MED ORDER — POTASSIUM CHLORIDE 10 MEQ/100ML IV SOLN
10.0000 meq | INTRAVENOUS | Status: AC
  Administered 2018-03-22 (×2): 10 meq via INTRAVENOUS
  Filled 2018-03-22 (×2): qty 100

## 2018-03-22 MED ORDER — PROMETHAZINE HCL 25 MG RE SUPP
25.0000 mg | Freq: Four times a day (QID) | RECTAL | Status: DC | PRN
  Administered 2018-03-22 – 2018-03-23 (×2): 25 mg via RECTAL
  Filled 2018-03-22 (×4): qty 1

## 2018-03-22 MED ORDER — ALUM & MAG HYDROXIDE-SIMETH 200-200-20 MG/5ML PO SUSP
30.0000 mL | Freq: Four times a day (QID) | ORAL | Status: DC | PRN
  Administered 2018-03-22 – 2018-03-23 (×2): 30 mL via ORAL
  Filled 2018-03-22 (×2): qty 30

## 2018-03-22 NOTE — ED Notes (Signed)
Patient transported to Ultrasound 

## 2018-03-22 NOTE — ED Notes (Signed)
CBG 130 

## 2018-03-22 NOTE — Progress Notes (Signed)
  ABG obtained on RA.

## 2018-03-22 NOTE — Progress Notes (Signed)
PROGRESS NOTE                                                                                                                                                                                                             Patient Demographics:    [name] [name], is a 47 y.o. male, DOB - 1970/12/19, ZOX:[id number]  Admit date - 03/21/2018   Admitting Physician John Giovanni, MD  Outpatient Primary MD for the patient is [name], PA-C  LOS - 0   Chief Complaint  Patient presents with  . Emesis       Brief Narrative    No charge note as  patient admitted earlier today by dr Loney Loh   Subjective:    [name] [name] today with complaints of nausea and vomiting.  Assessment  & Plan :    Principal Problem:   High anion gap metabolic acidosis Active Problems:   Leukocytosis   HTN (hypertension)   Hyperlipidemia   Epigastric abdominal pain   Nausea vomiting and diarrhea   AKI (acute kidney injury) (HCC)   Uncontrolled diabetes mellitus (HCC)  Anion gap metabolic acidosis -Likely in the setting of nausea, vomiting and starvation, unlikely DKA, normal glucose level on presentation, negative glucose urine analysis. -Initially on insulin drip, his anion gap has closed, he will be transitioned to Lantus 25 units daily, insulin sliding scale, he is ready started on clear liquid diet.  Nausea/vomiting -Most likely in the setting of gastroparesis, patient still with significant nausea and vomiting, actually is having his vomiting bag next to him with some clear liquid, will start on clear liquid diet, continue with IV fluids and observe closely. -Have given him 2 L fluid bolus over the last few hours   AKI -Creatinine 1.2; baseline 0.9.  Likely prerenal due to dehydration. -Continue IV fluids -Avoid nephrotoxic agents/contrast -Continue to monitor renal function   ?Hypocalcemia Calcium 7.6 on repeat labs but normal on initial CMP.    -Check ionized calcium level   Hypertension -Currently normotensive.  Hold home meds at this time.  HLD -Continue home Lipitor  Code Status : Full  Family Communication  : none at bedside  Disposition Plan  : home when stable  Barriers for discharge: Patient remains with significant nausea and vomiting, wearing aggressive IV fluid resuscitation, I have already ordered 2 L bolus of  the last 3 hours, will continue with normal saline.  Consults  :  none  Procedures  : none  DVT Prophylaxis  :  lovenox  Lab Results  Component Value Date   PLT 253 03/22/2018    Antibiotics  :    Anti-infectives (From admission, onward)   None        Objective:   Vitals:   03/22/18 1300 03/22/18 1400 03/22/18 1500 03/22/18 1600  BP: (!) 156/97     Pulse: 99 (!) 102 97 (!) 107  Resp: (!) 22 17 19  (!) 23  Temp:      TempSrc:      SpO2: 96% 98% 97% 96%  Weight:      Height:        Wt Readings from Last 3 Encounters:  03/22/18 74.4 kg  03/16/18 75.6 kg  03/06/18 77.2 kg     Intake/Output Summary (Last 24 hours) at 03/22/2018 1611 Last data filed at 03/22/2018 1500 Gross per 24 hour  Intake 5340.05 ml  Output 2050 ml  Net 3290.05 ml     Physical Exam  Awake Alert, Oriented X 3, No new F.N deficits, Normal affect, dry oral mucosa Symmetrical Chest wall movement, Good air movement bilaterally, CTAB RRR,No Gallops,Rubs or new Murmurs, No Parasternal Heave +ve B.Sounds, Abd Soft, No tenderness, No rebound - guarding or rigidity. No Cyanosis, Clubbing or edema, delayed skin turgor    Data Review:    CBC Recent Labs  Lab 03/16/18 0442 03/19/18 0326 03/21/18 2323 03/22/18 0244  WBC 16.9* 16.8* 15.7* 14.5*  HGB 16.5 14.0 13.9 13.9  HCT 46.5 43.5 43.4 42.9  PLT 272 218 237 253  MCV 85.6 91.6 92.5 91.7  MCH 30.4 29.5 29.6 29.7  MCHC 35.5 32.2 32.0 32.4  RDW 11.6 12.1 11.9 12.0    Chemistries  Recent Labs  Lab 03/16/18 0442 03/16/18 0848 03/16/18 1145  03/16/18 1356  03/16/18 2102  03/19/18 0326 03/21/18 2022 03/21/18 2323 03/22/18 0147 03/22/18 0542 03/22/18 1519  NA 133* 139  --  142   < > 141   < > 138 137 138 139 137 140  K 3.9 3.5  --  4.0   < > 3.3*   < > 3.6 5.2* 3.9 3.8 4.0 3.5  CL 96* 103  --  106   < > 108   < > 109 99 108 108 107 108  CO2 18* 23  --  25   < > 20*   < > 21* 16* 13* 17* 15* 20*  GLUCOSE 540* 283*  --  147*   < > 182*   < > 168* 124* 149* 107* 182* 105*  BUN 11 8  --  8   < > 7   < > 13 16 14 13 11 6   CREATININE 1.22 1.06  --  0.95   < > 0.93   < > 0.95 1.35* 1.23 1.19 1.28* 0.88  CALCIUM 9.6 8.7*  --  8.5*   < > 8.3*   < > 8.1* 8.9 7.6* 7.9* 7.8* 8.3*  MG  --  1.7 1.7 1.7  --  1.7  --  2.0  --   --   --   --   --   AST 16  --   --   --   --   --   --  12* 43*  --   --  15 15  ALT 15  --   --   --   --   --   --  12 15  --   --  11 10  ALKPHOS 123  --   --   --   --   --   --  81 103  --   --  76 88  BILITOT 1.2  --   --   --   --   --   --  1.3* 3.2*  --   --  1.4* 1.3*   < > = values in this interval not displayed.   ------------------------------------------------------------------------------------------------------------------ No results for input(s): CHOL, HDL, LDLCALC, TRIG, CHOLHDL, LDLDIRECT in the last 72 hours.  Lab Results  Component Value Date   HGBA1C 15.2 (H) 03/17/2018   ------------------------------------------------------------------------------------------------------------------ No results for input(s): TSH, T4TOTAL, T3FREE, THYROIDAB in the last 72 hours.  Invalid input(s): FREET3 ------------------------------------------------------------------------------------------------------------------ No results for input(s): VITAMINB12, FOLATE, FERRITIN, TIBC, IRON, RETICCTPCT in the last 72 hours.  Coagulation profile No results for input(s): INR, PROTIME in the last 168 hours.  No results for input(s): DDIMER in the last 72 hours.  Cardiac Enzymes No results for input(s): CKMB,  TROPONINI, MYOGLOBIN in the last 168 hours.  Invalid input(s): CK ------------------------------------------------------------------------------------------------------------------ No results found for: BNP  Inpatient Medications  Scheduled Meds: . atorvastatin  80 mg Oral Daily  . enoxaparin (LOVENOX) injection  40 mg Subcutaneous Q24H  . famotidine  20 mg Oral BID  . feeding supplement  1 Container Oral TID BM  . insulin aspart  0-9 Units Subcutaneous TID WC  . insulin glargine  25 Units Subcutaneous Daily  . metoCLOPramide  5 mg Oral TID AC   Continuous Infusions: . sodium chloride    . sodium chloride     PRN Meds:.alum & mag hydroxide-simeth, promethazine  Micro Results Recent Results (from the past 240 hour(s))  MRSA PCR Screening     Status: None   Collection Time: 03/16/18  1:42 PM  Result Value Ref Range Status   MRSA by PCR NEGATIVE NEGATIVE Final    Comment:        The GeneXpert MRSA Assay (FDA approved for NASAL specimens only), is one component of a comprehensive MRSA colonization surveillance program. It is not intended to diagnose MRSA infection nor to guide or monitor treatment for MRSA infections. Performed at [facility], [address] [place], [place] [address]     Radiology Reports US Abdomen Complete  Result Date: 03/22/2018 CLINICAL DATA:  Nausea, vomiting, diarrhea. EXAM: ABDOMEN ULTRASOUND COMPLETE COMPARISON:  Right upper quadrant ultrasound 03/16/2018 FINDINGS: Gallbladder: Physiologically distended. No gallstones or wall thickening visualized. No sonographic Murphy sign noted by sonographer. Common bile duct: Diameter: 3 mm, normal. Liver: No focal lesion identified. Diffusely increased and heterogeneous in parenchymal echogenicity. Portal vein is patent on color Doppler imaging with normal direction of blood flow towards the liver. IVC: No abnormality visualized. Pancreas: Visualized portion unremarkable. Spleen: Size and appearance  within normal limits. Right Kidney: Length: 10.6 cm. Echogenicity within normal limits. No mass or hydronephrosis visualized. Left Kidney: Length: 10.8 cm. Echogenicity within normal limits. No mass or hydronephrosis visualized. Abdominal aorta: No aneurysm visualized. Other findings: None.  No ascites. IMPRESSION: 1. Hepatic steatosis. 2. Otherwise unremarkable abdominal ultrasound. Electronically Signed   By: Narda Rutherford M.D.   On: 03/22/2018 04:12   Dg Chest Portable 1 View  Result Date: 03/16/2018 CLINICAL DATA:  47 y/o  M; tachypnea and shortness of breath. EXAM: PORTABLE CHEST 1 VIEW COMPARISON:  06/13/2017 chest radiograph. FINDINGS: Stable heart size and mediastinal contours are within normal  limits. Both lungs are clear. Chronic lower right anterolateral rib fractures. No acute osseous abnormality is evident. IMPRESSION: No active disease. Electronically Signed   By: Mitzi Hansen M.D.   On: 03/16/2018 06:39   Dg Abd Portable 1v  Result Date: 03/17/2018 CLINICAL DATA:  Nausea vomiting EXAM: PORTABLE ABDOMEN - 1 VIEW COMPARISON:  06/13/2017 FINDINGS: The bowel gas pattern is normal. No radio-opaque calculi or other significant radiographic abnormality are seen. IMPRESSION: Negative. Electronically Signed   By: Marlan Palau M.D.   On: 03/17/2018 09:46   US Abdomen Limited Ruq  Result Date: 03/16/2018 CLINICAL DATA:  Right upper quadrant abdominal pain EXAM: ULTRASOUND ABDOMEN LIMITED RIGHT UPPER QUADRANT COMPARISON:  05/24/2017 FINDINGS: Gallbladder: No gallstones or wall thickening visualized. No sonographic Murphy sign noted by sonographer. Common bile duct: Diameter: Normal caliber, 5 mm Liver: Increased echotexture compatible with fatty infiltration. No focal abnormality or biliary ductal dilatation. Portal vein is patent on color Doppler imaging with normal direction of blood flow towards the liver. IMPRESSION: Fatty infiltration of the liver. No acute findings.  Electronically Signed   By: Charlett Nose M.D.   On: 03/16/2018 08:09     Huey Bienenstock M.D on 03/22/2018 at 4:11 PM  Between 7am to 7pm - Pager - [phone]  After 7pm go to [email or web address] - password [facility]  [facility] -  Office  [phone]

## 2018-03-22 NOTE — H&P (Signed)
History and Physical    [name] [name] ZOX:[id number] DOB: [birth year] DOA: 03/21/2018  PCP: [name], PA-C Patient coming from: Home  Chief Complaint: Nausea, vomiting, abdominal pain  HPI: [name] [name] is a 47 y.o. male with medical history significant of coronary artery disease, COPD, type 2 diabetes, GERD, hypertension, hyperlipidemia presenting to the hospital for further evaluation of nausea, vomiting, and epigastric abdominal pain.  Patient states he continued to have nausea and bilious vomiting since leaving the hospital.  Also having epigastric abdominal pain and has reflux.  He has not been able to eat or drink.  He had 3 episodes of brown watery diarrhea last night.  Denies taking any laxatives.  Also having chills.  States he has been taking all of his home medications as prescribed.  Patient was admitted from 10/17-10/20 for DKA and severe diabetic gastroparesis.  ED Course: Afebrile, slightly tachycardic.  Remainder of vitals stable in the ED.  Blood glucose 124.  White count 15.7; was 16.8 at the time of his discharge 3 days ago.  Bicarb 16 on initial labs but repeat labs revealed bicarb 13.  Anion gap 22 on initial labs; 17 on repeat.  Potassium 5.2 and initial labs in 3.9 on repeat.  Creatinine 1.2; baseline 0.9.  Calcium 7.6 on repeat labs but normal on initial CMP.  Lipase normal.  I-STAT troponin negative.  Patient received insulin, Reglan, Dilaudid, Pepcid, IV potassium, and IV fluid boluses in the ED. [facility] paged to admit.   Review of Systems: As per HPI otherwise 10 point review of systems negative.  Past Medical History:  Diagnosis Date  . Anxiety   . Arthritis    "hands & feet" (03/16/2018)  . CAD (coronary artery disease)    Moderate LAD disease 2016 - Dr. Jacinto Halim  . Chronic bronchitis (HCC)   . Chronic upper back pain   . COPD (chronic obstructive pulmonary disease) (HCC)   . Daily headache    (03/16/2018)  . Depression   . Diabetic peripheral  neuropathy (HCC)   . DKA, type 2 (HCC)   . Family history of adverse reaction to anesthesia    "my dad didn't go to sleep; he couldn't move but was awake during colonoscopy" (03/16/2018)  . GERD (gastroesophageal reflux disease)   . History of gout   . Hyperlipemia   . Hypertension   . Migraine    "weekly" (03/16/2018)  . 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 Sleep apnea 2016   "insurance wouldn't pay for mask" (03/16/2018)  . Type 2 diabetes mellitus (HCC)     Past Surgical History:  Procedure Laterality Date  . ANKLE SURGERY Right 1982   "had extra bones in there; took them out"  . APPENDECTOMY  1975  . CARDIAC CATHETERIZATION N/A 03/28/2015   Procedure: Left Heart Cath and Coronary Angiography;  Surgeon: Yates Decamp, MD;  Location: [facility] INVASIVE CV LAB;  Service: Cardiovascular;  Laterality: N/A;  . CARDIAC CATHETERIZATION N/A 03/28/2015   Procedure: Intravascular Pressure Wire/FFR Study;  Surgeon: Yates Decamp, MD;  Location: [facility] INVASIVE CV LAB;  Service: Cardiovascular;  Laterality: N/A;  . CARPAL TUNNEL RELEASE Left ~ 2008  . COLONOSCOPY WITH ESOPHAGOGASTRODUODENOSCOPY (EGD)    . ELBOW FRACTURE SURGERY Left ~ 2008  . FRACTURE SURGERY    . PILONIDAL CYST EXCISION N/A 03/24/2017   Procedure: EXCISION CHRONIC  PILONIDAL ABSCESS;  Surgeon: [name] Miyamoto, MD;  Location: [facility] ORS;  Service: General;  Laterality: N/A;  . TENDON REPAIR Left ~  2004   "main tendon in my ankle"     reports that he has been smoking cigarettes. He has a 17.00 pack-year smoking history. He has never used smokeless tobacco. He reports that he does not drink alcohol or use drugs.  Allergies  Allergen Reactions  . Omeprazole Magnesium Swelling    Face swells, no breathing impairment  . Esomeprazole Swelling    Face swells, no breathing impairment    Family History  Problem Relation Age of Onset  . Congestive Heart Failure Sister   . Diabetes Mother   . Hypertension Mother   . Diabetes Father   .  Hypertension Father     Prior to Admission medications   Medication Sig Start Date End Date Taking? Authorizing Provider  aspirin 81 MG tablet Take 81 mg by mouth daily.   Yes [provider]  atorvastatin (LIPITOR) 80 MG tablet Take 1 tablet (80 mg total) by mouth daily. 01/12/18  Yes Jonelle Sidle, MD  famotidine (PEPCID) 20 MG tablet Take 1 tablet (20 mg total) by mouth 2 (two) times daily. 03/06/18  Yes [name], PA-C  hydrOXYzine (ATARAX/VISTARIL) 25 MG tablet Take 25 mg by mouth 3 (three) times daily as needed for anxiety.    Yes [provider]  Insulin Glargine (LANTUS SOLOSTAR) 100 UNIT/ML Solostar Pen Inject 10 Units into the skin at bedtime. Patient taking differently: Inject 40 Units into the skin at bedtime.  09/05/17  Yes [name], PA-C  isosorbide mononitrate (IMDUR) 30 MG 24 hr tablet Take 0.5 tablets (15 mg total) by mouth daily. 01/12/18 04/12/18 Yes Jonelle Sidle, MD  metFORMIN (GLUCOPHAGE) 1000 MG tablet TAKE 1 Tablet  BY MOUTH TWICE DAILY WITH MEALS Patient taking differently: Take 1,000 mg by mouth 2 (two) times daily with a meal.  09/20/17  Yes [name], PA-C  metoprolol tartrate (LOPRESSOR) 50 MG tablet Take 1 tablet (50 mg total) by mouth 2 (two) times daily. 02/20/18 05/21/18 Yes Dyann Kief, PA-C  nitroGLYCERIN (NITROSTAT) 0.4 MG SL tablet Place 1 tablet (0.4 mg total) under the tongue every 5 (five) minutes as needed for chest pain. 10/05/17  Yes [name], PA-C  insulin aspart (NOVOLOG) 100 UNIT/ML injection Before each meal 3 times a day, 140-199 - 2 units, 200-250 - 4 units, 251-299 - 6 units,  300-349 - 8 units,  350 or above 10 units. Dispense syringes and needles as needed, Ok to switch to PEN if approved. Substitute to any brand approved. DX DM2, Code E11.65 03/19/18   Leroy Sea, MD  metoCLOPramide (REGLAN) 5 MG tablet Take 1 tablet (5 mg total) by mouth 3 (three) times daily before meals. 03/19/18  04/18/18  Leroy Sea, MD  promethazine (PHENERGAN) 25 MG suppository Place 1 suppository (25 mg total) rectally every 6 (six) hours as needed for nausea. 03/19/18 03/19/19  Leroy Sea, MD    Physical Exam: Vitals:   03/22/18 0100 03/22/18 0115 03/22/18 0130 03/22/18 0131  BP: 115/74 127/77 (!) 142/97   Pulse: (!) 103 (!) 104 (!) 105   Resp: (!) 21 16 20    Temp:    98.2 F (36.8 C)  TempSrc:      SpO2: 95% 100% 100%   Weight:      Height:       Physical Exam  Constitutional: He is oriented to person, place, and time. No distress.  Resting comfortably in a hospital stretcher  HENT:  Head: Normocephalic and atraumatic.  Mouth/Throat:  Oropharynx is clear and moist.  Eyes: Right eye exhibits no discharge. Left eye exhibits no discharge.  Neck: Neck supple. No tracheal deviation present.  Cardiovascular: Normal rate, regular rhythm and intact distal pulses.  Pulmonary/Chest: Effort normal and breath sounds normal. No respiratory distress. He has no wheezes. He has no rales.  Abdominal: Soft. Bowel sounds are normal. He exhibits no distension. There is tenderness. There is guarding. There is no rebound.  Generalized tenderness to palpation with guarding.  No rebound or rigidity.  Musculoskeletal: He exhibits no edema.  Neurological: He is alert and oriented to person, place, and time.  Skin: Skin is warm and dry. He is not diaphoretic.     Labs on Admission: I have personally reviewed following labs and imaging studies  CBC: Recent Labs  Lab 03/16/18 0442 03/19/18 0326 03/21/18 2323  WBC 16.9* 16.8* 15.7*  HGB 16.5 14.0 13.9  HCT 46.5 43.5 43.4  MCV 85.6 91.6 92.5  PLT 272 218 237   Basic Metabolic Panel: Recent Labs  Lab 03/16/18 0848 03/16/18 1145 03/16/18 1356  03/16/18 2102 03/17/18 0326 03/18/18 0529 03/19/18 0326 03/21/18 2022 03/21/18 2323  NA 139  --  142   < > 141 138 138 138 137 138  K 3.5  --  4.0   < > 3.3* 3.4* 3.8 3.6 5.2* 3.9  CL  103  --  106   < > 108 107 104 109 99 108  CO2 23  --  25   < > 20* 22 19* 21* 16* 13*  GLUCOSE 283*  --  147*   < > 182* 195* 217* 168* 124* 149*  BUN 8  --  8   < > 7 7 11 13 16 14   CREATININE 1.06  --  0.95   < > 0.93 0.94 1.02 0.95 1.35* 1.23  CALCIUM 8.7*  --  8.5*   < > 8.3* 8.2* 8.7* 8.1* 8.9 7.6*  MG 1.7 1.7 1.7  --  1.7  --   --  2.0  --   --   PHOS 3.9 3.1 3.2  --  3.1  --   --   --   --   --    < > = values in this interval not displayed.   GFR: Estimated Creatinine Clearance: 71.1 mL/min (by C-G formula based on SCr of 1.23 mg/dL). Liver Function Tests: Recent Labs  Lab 03/16/18 0442 03/19/18 0326 03/21/18 2022  AST 16 12* 43*  ALT 15 12 15   ALKPHOS 123 81 103  BILITOT 1.2 1.3* 3.2*  PROT 7.3 5.9* 7.0  ALBUMIN 3.8 2.8* 3.5   Recent Labs  Lab 03/16/18 0442 03/21/18 2022  LIPASE 26 18   No results for input(s): AMMONIA in the last 168 hours. Coagulation Profile: No results for input(s): INR, PROTIME in the last 168 hours. Cardiac Enzymes: No results for input(s): CKTOTAL, CKMB, CKMBINDEX, TROPONINI in the last 168 hours. BNP (last 3 results) No results for input(s): PROBNP in the last 8760 hours. HbA1C: No results for input(s): HGBA1C in the last 72 hours. CBG: Recent Labs  Lab 03/18/18 1726 03/18/18 2203 03/19/18 0801 03/22/18 0000 03/22/18 0115  GLUCAP 104* 94 128* 139* 96   Lipid Profile: No results for input(s): CHOL, HDL, LDLCALC, TRIG, CHOLHDL, LDLDIRECT in the last 72 hours. Thyroid Function Tests: No results for input(s): TSH, T4TOTAL, FREET4, T3FREE, THYROIDAB in the last 72 hours. Anemia Panel: No results for input(s): VITAMINB12, FOLATE, FERRITIN, TIBC, IRON, RETICCTPCT  in the last 72 hours. Urine analysis:    Component Value Date/Time   COLORURINE COLORLESS (A) 03/16/2018 0722   APPEARANCEUR CLEAR 03/16/2018 0722   LABSPEC 1.015 03/16/2018 0722   PHURINE 7.0 03/16/2018 0722   GLUCOSEU >=500 (A) 03/16/2018 0722   HGBUR MODERATE (A)  03/16/2018 0722   BILIRUBINUR NEGATIVE 03/16/2018 0722   KETONESUR 20 (A) 03/16/2018 0722   PROTEINUR NEGATIVE 03/16/2018 0722   NITRITE NEGATIVE 03/16/2018 0722   LEUKOCYTESUR NEGATIVE 03/16/2018 0722    Radiological Exams on Admission: No results found.  Assessment/Plan Principal Problem:   High anion gap metabolic acidosis Active Problems:   Leukocytosis   HTN (hypertension)   Hyperlipidemia   Epigastric abdominal pain   Nausea vomiting and diarrhea   AKI (acute kidney injury) (HCC)   Uncontrolled diabetes mellitus (HCC)   High anion gap metabolic acidosis - Afebrile. White count 15.7; was 16.8 at the time of his discharge 3 days ago. -DKA is a possibility, although patient's blood glucose was 124 on admission.  Bicarb as low as 13 and anion gap is high as 22.  He was started on DKA protocol in the ED - insulin drip and IV fluid boluses -BMP q4 hrs -CBG checks q1 hr -Check urine ketones -Check lactic acid   Epigastric abdominal pain, nausea, vomiting, diarrhea -Nausea and vomiting could be explained by possible diabetic gastroparesis in the setting of uncontrolled type 2 diabetes with recent A1c 15.2 -GERD could also be contributing.  Allergic to PPIs.  Give Pepcid and GI cocktail  -LFTs normal but Tbili elevated at 3.2.  Lipase normal. -Abdominal US -GI pathogen panel  -CMP in am -Continue home Reglan -Phenergan prn nausea/ vomiting  -Patient will need a gastric emptying study done as outpatient.  Persistent leukocytosis White count 15.7; was 16.8 at the time of discharge 3 days ago.  Patient is afebrile.  Lungs clear on exam.  Leukocytosis possibly reactive.  -UA pending -Lactic acid  -Repeat CBC in am  Uncontrolled type 2 diabetes -Recent A1c 15.2 -Currently on DKA protocol. Resume home insulin after patient is able to eat, bicarb normal, and gap closes x2 -Consult diabetes coordinator    AKI -Creatinine 1.2; baseline 0.9.  Likely prerenal due to  dehydration. -Continue IV fluids -Avoid nephrotoxic agents/contrast -Continue to monitor renal function   ?Hypocalcemia Calcium 7.6 on repeat labs but normal on initial CMP.  -Check ionized calcium level   Hypertension -Currently normotensive.  Hold home meds at this time.  HLD -Continue home Lipitor    DVT prophylaxis: Lovenox Code Status: Patient wishes to be full code. Family Communication: No family present at bedside. Disposition Plan: Anticipate discharge to home in 1 to 2 days. Consults called: None Admission status: Observation   John Giovanni MD [facility] Pager [phone]  If 7PM-7AM, please contact night-coverage [email or web address] Password [email or web address]  03/22/2018, 1:56 AM

## 2018-03-22 NOTE — ED Notes (Signed)
Checked CBG 168, RN Mario informed

## 2018-03-22 NOTE — ED Notes (Signed)
Insulin verified with Aundra Millet b, RN

## 2018-03-22 NOTE — ED Notes (Signed)
Checked CBG 107, RN Mario informed

## 2018-03-22 NOTE — Progress Notes (Addendum)
Inpatient Diabetes Program Recommendations  AACE/ADA: New Consensus Statement on Inpatient Glycemic Control (2015)  Target Ranges:  Prepandial:   less than 140 mg/dL      Peak postprandial:   less than 180 mg/dL (1-2 hours)      Critically ill patients:  140 - 180 mg/dL   Lab Results  Component Value Date   GLUCAP 204 (H) 03/22/2018   HGBA1C 15.2 (H) 03/17/2018    Review of Glycemic Control Results for HADDON, FYFE (MRN [id number]) as of 03/22/2018 09:07  Ref. Range 03/22/2018 06:45 03/22/2018 07:52 03/22/2018 08:59  Glucose-Capillary Latest Ref Range: 70 - 99 mg/dL 409 (H) 811 (H) 914 (H)   Diabetes history: Type 2 DM Outpatient Diabetes medications: Lantus 40 units QD, Metformin 1000 mg BID Current orders for Inpatient glycemic control: insulin drip  Inpatient Diabetes Program Recommendations:    Patient readmitted from 10/17-10/20. Spoke with patient on 10/18 and verified home medications. However, of note, in December of 2018, patient was taking Glyambi, which includes a SGLT2 (jardiance). Patients can be prone to DKA, especially in the setting of nausea/vomiting and lack of oral intake.   Will plan to see patient again today to confirm.   Addendum @ 1340: Spoke with patient regarding readmission. Patient is tearful about being readmitted. Reviewed patient medications in detail. Reviewed Glyxambi and patient refuses taking this medication. Confirmed he was taking insulin as prescribed.  Patient was checking blood sugars since admission and he states, "All my blood sugars were between 100-190 mg/dL."  Patient encouraged to disard medications he no longer is taking to avoid confusion. Has had poor oral intake even when at home, unsure if this related to ketosis given blood sugars. Patient has no further questions at this time, will continue to follow.  Thanks, Lujean Rave, MSN, RNC-OB Diabetes Coordinator [phone] (8a-5p)

## 2018-03-22 NOTE — ED Notes (Signed)
Pt is aware he needs a urine sample. Urinal at bedside

## 2018-03-22 NOTE — ED Notes (Signed)
Checked CBG 146, RN Mario informed

## 2018-03-22 NOTE — ED Notes (Signed)
CBG 139. 

## 2018-03-23 DIAGNOSIS — K3184 Gastroparesis: Secondary | ICD-10-CM

## 2018-03-23 DIAGNOSIS — N179 Acute kidney failure, unspecified: Secondary | ICD-10-CM

## 2018-03-23 DIAGNOSIS — R112 Nausea with vomiting, unspecified: Secondary | ICD-10-CM

## 2018-03-23 LAB — GLUCOSE, CAPILLARY
GLUCOSE-CAPILLARY: 119 mg/dL — AB (ref 70–99)
GLUCOSE-CAPILLARY: 95 mg/dL (ref 70–99)
GLUCOSE-CAPILLARY: 82 mg/dL (ref 70–99)
Glucose-Capillary: 116 mg/dL — ABNORMAL HIGH (ref 70–99)
Glucose-Capillary: 68 mg/dL — ABNORMAL LOW (ref 70–99)

## 2018-03-23 LAB — BASIC METABOLIC PANEL
ANION GAP: 13 (ref 5–15)
BUN: <5 mg/dL — ABNORMAL LOW (ref 6–20)
CHLORIDE: 105 mmol/L (ref 98–111)
CO2: 20 mmol/L — ABNORMAL LOW (ref 22–32)
Calcium: 8.3 mg/dL — ABNORMAL LOW (ref 8.9–10.3)
Creatinine, Ser: 0.95 mg/dL (ref 0.61–1.24)
GFR calc non Af Amer: >60 mL/min (ref >60)
Glucose, Bld: 121 mg/dL — ABNORMAL HIGH (ref 70–99)
Potassium: 3.5 mmol/L (ref 3.5–5.1)
Sodium: 138 mmol/L (ref 135–145)

## 2018-03-23 LAB — CBC
HEMATOCRIT: 41.7 % (ref 39.0–52.0)
Hemoglobin: 14 g/dL (ref 13.0–17.0)
MCH: 29.8 pg (ref 26.0–34.0)
MCHC: 33.6 g/dL (ref 30.0–36.0)
MCV: 88.7 fL (ref 80.0–100.0)
NRBC: 0 % (ref 0.0–0.2)
Platelets: 237 10*3/uL (ref 150–400)
RBC: 4.7 MIL/uL (ref 4.22–5.81)
RDW: 11.8 % (ref 11.5–15.5)
WBC: 9.4 10*3/uL (ref 4.0–10.5)

## 2018-03-23 LAB — CALCIUM, IONIZED: Calcium, Ionized, Serum: 4.7 mg/dL (ref 4.5–5.6)

## 2018-03-23 MED ORDER — DEXTROSE-NACL 5-0.45 % IV SOLN
INTRAVENOUS | Status: DC
  Administered 2018-03-23 – 2018-03-24 (×3): via INTRAVENOUS

## 2018-03-23 MED ORDER — ONDANSETRON HCL 4 MG/2ML IJ SOLN
4.0000 mg | Freq: Four times a day (QID) | INTRAMUSCULAR | Status: DC | PRN
  Administered 2018-03-24: 4 mg via INTRAVENOUS
  Filled 2018-03-23 (×2): qty 2

## 2018-03-23 MED ORDER — BUTALBITAL-APAP-CAFFEINE 50-325-40 MG PO TABS
2.0000 | ORAL_TABLET | Freq: Once | ORAL | Status: AC
  Administered 2018-03-23: 2 via ORAL
  Filled 2018-03-23: qty 2

## 2018-03-23 MED ORDER — HYDRALAZINE HCL 20 MG/ML IJ SOLN
10.0000 mg | Freq: Once | INTRAMUSCULAR | Status: AC
  Administered 2018-03-23: 10 mg via INTRAVENOUS
  Filled 2018-03-23: qty 1

## 2018-03-23 MED ORDER — METOCLOPRAMIDE HCL 5 MG/ML IJ SOLN
5.0000 mg | Freq: Four times a day (QID) | INTRAMUSCULAR | Status: AC
  Administered 2018-03-23 – 2018-03-24 (×7): 5 mg via INTRAVENOUS
  Filled 2018-03-23 (×7): qty 2

## 2018-03-23 MED ORDER — GLUCERNA SHAKE PO LIQD
237.0000 mL | Freq: Three times a day (TID) | ORAL | Status: DC
  Administered 2018-03-24 (×2): 237 mL via ORAL

## 2018-03-23 MED ORDER — METOPROLOL TARTRATE 5 MG/5ML IV SOLN
5.0000 mg | Freq: Once | INTRAVENOUS | Status: AC
  Administered 2018-03-23: 5 mg via INTRAVENOUS
  Filled 2018-03-23: qty 5

## 2018-03-23 MED ORDER — HYDRALAZINE HCL 20 MG/ML IJ SOLN
10.0000 mg | Freq: Four times a day (QID) | INTRAMUSCULAR | Status: DC | PRN
  Administered 2018-03-24: 10 mg via INTRAVENOUS
  Filled 2018-03-23: qty 1

## 2018-03-23 MED ORDER — INSULIN GLARGINE 100 UNIT/ML ~~LOC~~ SOLN
15.0000 [IU] | Freq: Every day | SUBCUTANEOUS | Status: DC
  Filled 2018-03-23: qty 0.15

## 2018-03-23 MED ORDER — LABETALOL HCL 5 MG/ML IV SOLN
10.0000 mg | Freq: Once | INTRAVENOUS | Status: AC
  Administered 2018-03-23: 10 mg via INTRAVENOUS
  Filled 2018-03-23: qty 4

## 2018-03-23 NOTE — Progress Notes (Signed)
Initial Nutrition Assessment  DOCUMENTATION CODES:   Not applicable  INTERVENTION:  Monitor for diet advancement and recommend  -Ensure Enlive po TID, each supplement provides 350 kcal and 20 grams of protein at that time  Pt denies ONS of any type at this time d/t what is offered on current CL diet    NUTRITION DIAGNOSIS:   Inadequate oral intake related to altered GI function, nausea, vomiting as evidenced by estimated needs, per patient/family report(NPO/CL insufficient to meet needs).   GOAL:   Patient will meet greater than or equal to 90% of their needs    MONITOR:   PO intake, Diet advancement, Supplement acceptance, Weight trends  REASON FOR ASSESSMENT:   Malnutrition Screening Tool    ASSESSMENT:  47yo pt admitted for evaluation of n/v/d and abdominal pain. Recent admission 10/17-10/20 for DKA and severe gastroparesis. PMH: HTN, HLD, GERD, CAD, COPD,T2DM, Gout, Appendectomy, Cardiac Cath  Recent [facility] 5W Progressive d/c 10/17  Pt lying in bed and moaning at times during the visit and reports no improvements to n/v. Pt had 2-4oz of fluid in bedside bag and spit up during interview. Pt expressed frustration for MD requesting him to walk and stated he was having trouble feeling his left calf and could not feel RD touching calf at [facility].   RD inquired about Boost Breeze flavor at bedside, pt stated they were disgusting and he had tried all flavors and does not want anymore. Pt also refused Unjury and Juven. Pt requested the vanilla drink he had on a separate admission and RD let pt know that at diet advancement, those could be ordered for him.  Pt reports weighing 250lbs approximately 22yrs ago and a more recent UBW of 165-170lb.    NUTRITION - FOCUSED PHYSICAL EXAM:    Most Recent Value  Orbital Region  No depletion  Upper Arm Region  Mild depletion  Thoracic and Lumbar Region  No depletion  Buccal Region  Mild depletion  Temple Region  Mild depletion  Clavicle  Bone Region  Mild depletion  Clavicle and Acromion Bone Region  Mild depletion  Scapular Bone Region  No depletion  Dorsal Hand  No depletion  Patellar Region  Mild depletion  Anterior Thigh Region  Mild depletion  Posterior Calf Region  Mild depletion  Edema (RD Assessment)  None  Hair  Reviewed  Eyes  Reviewed  Mouth  Reviewed  Skin  Reviewed  Nails  Reviewed       Diet Order:   Diet Order            Diet clear liquid Room service appropriate? Yes; Fluid consistency: Thin  Diet effective now            10/23: NPO/CL 10/24: CHO Mod/CL  EDUCATION NEEDS:   No education needs have been identified at this time  Skin:  Skin Assessment: Reviewed RN Assessment(abraison to rt toe)  Last BM:  10/19  Height:   Ht Readings from Last 1 Encounters:  03/22/18 5\' 8"  (1.727 m)    Weight:   Wt Readings from Last 1 Encounters:  03/22/18 74.4 kg    Ideal Body Weight:     BMI:  Body mass index is 24.94 kg/m.  Estimated Nutritional Needs:   Kcal:  1910-2100  Protein:  97-112g  Fluid:  1.9-2.1L    Lars Masson, RD, LDN  After Hours/Weekend Pager: [phone]

## 2018-03-23 NOTE — Progress Notes (Signed)
Pt had no BM for 24 hours as per pt. MD made aware. Enteric isolation d/ced.

## 2018-03-23 NOTE — Progress Notes (Signed)
Hypoglycemic Event  CBG:68 Treatment:   15 GM carbohydrate snack  Symptoms:  None  Follow-up CBG: Time:1800 hrBG Result 95 Possible Reasons for Event: inadequate po intake   Comments/MD notified 1825   Raife Lizer I

## 2018-03-23 NOTE — Progress Notes (Signed)
PROGRESS NOTE                                                                                                                                                                                                             Patient Demographics:    [name] [name], is a 47 y.o. male, DOB - 1971/01/19, WUJ:[id number]  Admit date - 03/21/2018   Admitting Physician John Giovanni, MD  Outpatient Primary MD for the patient is [name], PA-C  LOS - 0   Chief Complaint  Patient presents with  . Emesis       Brief Narrative    47 y.o. male with medical history significant of coronary artery disease, COPD, type 2 diabetes, GERD, hypertension, hyperlipidemia presenting to the hospital for further evaluation of nausea, vomiting, and epigastric abdominal pain, patient was noted to have embolic acidosis, but no hypoglycemia noted.   Subjective:    Tannar Broker today still with significant nausea and vomiting, unable to tolerate any oral intake.   Assessment  & Plan :    Principal Problem:   High anion gap metabolic acidosis Active Problems:   Leukocytosis   HTN (hypertension)   Hyperlipidemia   Epigastric abdominal pain   Nausea vomiting and diarrhea   AKI (acute kidney injury) (HCC)   Uncontrolled diabetes mellitus (HCC)  Anion gap metabolic acidosis -Likely in the setting of nausea, vomiting and starvation, unlikely DKA, normal glucose level on presentation, negative glucose urine analysis. -Anion gap has closed with IV fluids. -Does not appear to have DKA  Persistent nausea and vomiting -This is most likely in the setting of gastroparesis, he had 4 admissions last December for similar presentation, and having significant nausea and vomiting, unable to tolerate any oral intake, so I would keep him on clear liquid diet, I have started on IV Reglan 5 mg IV every 6 hours, he is receiving Phenergan per rectum as well, if no improvement would  consider IV erythromycin in the next 24 hours. -I will obtain EKG to determine baseline QTC before initiating IV erythromycin -We will start on PRN Zofran, will start on Glucerna  AKI -Renal, resolved, renal function back to baseline, continue with IV fluids  Diabetes mellitus -Appears to be controlled on current regimen, Lantus 25 units, and insulin sliding scale   Hypertension -Currently normotensive.  Hold home meds  at this time.  HLD -Continue home Lipitor  Code Status : Full  Family Communication  : none at bedside  Disposition Plan  : home when stable  Barriers for discharge: Patient remains with persistent nausea and vomiting, requiring IV fluid resuscitation, IV Reglan, no reliable oral intake .  Consults  :  none  Procedures  : none  DVT Prophylaxis  :  lovenox  Lab Results  Component Value Date   PLT 237 03/23/2018    Antibiotics  :    Anti-infectives (From admission, onward)   None        Objective:   Vitals:   03/23/18 0318 03/23/18 0335 03/23/18 0730 03/23/18 1125  BP: (!) 192/117 (!) 183/107 119/69   Pulse:   98   Resp:   13 15  Temp:   98.3 F (36.8 C) 99.3 F (37.4 C)  TempSrc:   Oral Oral  SpO2:   98% 95%  Weight:      Height:        Wt Readings from Last 3 Encounters:  03/22/18 74.4 kg  03/16/18 75.6 kg  03/06/18 77.2 kg     Intake/Output Summary (Last 24 hours) at 03/23/2018 1226 Last data filed at 03/23/2018 1200 Gross per 24 hour  Intake 1795.05 ml  Output 1825 ml  Net -29.95 ml     Physical Exam  Awake Alert, Oriented X 3, No new F.N deficits, Normal affect Symmetrical Chest wall movement, Good air movement bilaterally, CTAB RRR,No Gallops,Rubs or new Murmurs, No Parasternal Heave +ve B.Sounds, Abd Soft, No tenderness, No rebound - guarding or rigidity. No Cyanosis, Clubbing or edema, No new Rash or bruise      Data Review:    CBC Recent Labs  Lab 03/19/18 0326 03/21/18 2323 03/22/18 0244  03/23/18 0255  WBC 16.8* 15.7* 14.5* 9.4  HGB 14.0 13.9 13.9 14.0  HCT 43.5 43.4 42.9 41.7  PLT 218 237 253 237  MCV 91.6 92.5 91.7 88.7  MCH 29.5 29.6 29.7 29.8  MCHC 32.2 32.0 32.4 33.6  RDW 12.1 11.9 12.0 11.8    Chemistries  Recent Labs  Lab 03/16/18 1356  03/16/18 2102  03/19/18 0326 03/21/18 2022 03/21/18 2323 03/22/18 0147 03/22/18 0542 03/22/18 1519 03/23/18 0255  NA 142   < > 141   < > 138 137 138 139 137 140 138  K 4.0   < > 3.3*   < > 3.6 5.2* 3.9 3.8 4.0 3.5 3.5  CL 106   < > 108   < > 109 99 108 108 107 108 105  CO2 25   < > 20*   < > 21* 16* 13* 17* 15* 20* 20*  GLUCOSE 147*   < > 182*   < > 168* 124* 149* 107* 182* 105* 121*  BUN 8   < > 7   < > 13 16 14 13 11 6  <5*  CREATININE 0.95   < > 0.93   < > 0.95 1.35* 1.23 1.19 1.28* 0.88 0.95  CALCIUM 8.5*   < > 8.3*   < > 8.1* 8.9 7.6* 7.9* 7.8* 8.3* 8.3*  MG 1.7  --  1.7  --  2.0  --   --   --   --   --   --   AST  --   --   --   --  12* 43*  --   --  15 15  --   ALT  --   --   --   --  12 15  --   --  11 10  --   ALKPHOS  --   --   --   --  81 103  --   --  76 88  --   BILITOT  --   --   --   --  1.3* 3.2*  --   --  1.4* 1.3*  --    < > = values in this interval not displayed.   ------------------------------------------------------------------------------------------------------------------ No results for input(s): CHOL, HDL, LDLCALC, TRIG, CHOLHDL, LDLDIRECT in the last 72 hours.  Lab Results  Component Value Date   HGBA1C 15.2 (H) 03/17/2018   ------------------------------------------------------------------------------------------------------------------ No results for input(s): TSH, T4TOTAL, T3FREE, THYROIDAB in the last 72 hours.  Invalid input(s): FREET3 ------------------------------------------------------------------------------------------------------------------ No results for input(s): VITAMINB12, FOLATE, FERRITIN, TIBC, IRON, RETICCTPCT in the last 72 hours.  Coagulation profile No  results for input(s): INR, PROTIME in the last 168 hours.  No results for input(s): DDIMER in the last 72 hours.  Cardiac Enzymes No results for input(s): CKMB, TROPONINI, MYOGLOBIN in the last 168 hours.  Invalid input(s): CK ------------------------------------------------------------------------------------------------------------------ No results found for: BNP  Inpatient Medications  Scheduled Meds: . atorvastatin  80 mg Oral Daily  . enoxaparin (LOVENOX) injection  40 mg Subcutaneous Q24H  . famotidine  20 mg Oral BID  . feeding supplement  1 Container Oral TID BM  . insulin aspart  0-9 Units Subcutaneous TID WC  . insulin glargine  25 Units Subcutaneous Daily  . metoCLOPramide (REGLAN) injection  5 mg Intravenous Q6H   Continuous Infusions: . sodium chloride 75 mL/hr at 03/23/18 0511   PRN Meds:.alum & mag hydroxide-simeth, promethazine  Micro Results Recent Results (from the past 240 hour(s))  MRSA PCR Screening     Status: None   Collection Time: 03/16/18  1:42 PM  Result Value Ref Range Status   MRSA by PCR NEGATIVE NEGATIVE Final    Comment:        The GeneXpert MRSA Assay (FDA approved for NASAL specimens only), is one component of a comprehensive MRSA colonization surveillance program. It is not intended to diagnose MRSA infection nor to guide or monitor treatment for MRSA infections. Performed at [facility], [address] [place], [place] [address]   MRSA PCR Screening     Status: None   Collection Time: 03/22/18  8:35 PM  Result Value Ref Range Status   MRSA by PCR NEGATIVE NEGATIVE Final    Comment:        The GeneXpert MRSA Assay (FDA approved for NASAL specimens only), is one component of a comprehensive MRSA colonization surveillance program. It is not intended to diagnose MRSA infection nor to guide or monitor treatment for MRSA infections. Performed at [facility], [address] [place], [place] [address]      Radiology Reports US Abdomen Complete  Result Date: 03/22/2018 CLINICAL DATA:  Nausea, vomiting, diarrhea. EXAM: ABDOMEN ULTRASOUND COMPLETE COMPARISON:  Right upper quadrant ultrasound 03/16/2018 FINDINGS: Gallbladder: Physiologically distended. No gallstones or wall thickening visualized. No sonographic Murphy sign noted by sonographer. Common bile duct: Diameter: 3 mm, normal. Liver: No focal lesion identified. Diffusely increased and heterogeneous in parenchymal echogenicity. Portal vein is patent on color Doppler imaging with normal direction of blood flow towards the liver. IVC: No abnormality visualized. Pancreas: Visualized portion unremarkable. Spleen: Size and appearance within normal limits. Right Kidney: Length: 10.6 cm. Echogenicity within normal limits. No mass or hydronephrosis visualized. Left Kidney: Length: 10.8  cm. Echogenicity within normal limits. No mass or hydronephrosis visualized. Abdominal aorta: No aneurysm visualized. Other findings: None.  No ascites. IMPRESSION: 1. Hepatic steatosis. 2. Otherwise unremarkable abdominal ultrasound. Electronically Signed   By: Narda Rutherford M.D.   On: 03/22/2018 04:12   Dg Chest Portable 1 View  Result Date: 03/16/2018 CLINICAL DATA:  47 y/o  M; tachypnea and shortness of breath. EXAM: PORTABLE CHEST 1 VIEW COMPARISON:  06/13/2017 chest radiograph. FINDINGS: Stable heart size and mediastinal contours are within normal limits. Both lungs are clear. Chronic lower right anterolateral rib fractures. No acute osseous abnormality is evident. IMPRESSION: No active disease. Electronically Signed   By: Mitzi Hansen M.D.   On: 03/16/2018 06:39   Dg Abd Portable 1v  Result Date: 03/17/2018 CLINICAL DATA:  Nausea vomiting EXAM: PORTABLE ABDOMEN - 1 VIEW COMPARISON:  06/13/2017 FINDINGS: The bowel gas pattern is normal. No radio-opaque calculi or other significant radiographic abnormality are seen. IMPRESSION: Negative.  Electronically Signed   By: Marlan Palau M.D.   On: 03/17/2018 09:46   US Abdomen Limited Ruq  Result Date: 03/16/2018 CLINICAL DATA:  Right upper quadrant abdominal pain EXAM: ULTRASOUND ABDOMEN LIMITED RIGHT UPPER QUADRANT COMPARISON:  05/24/2017 FINDINGS: Gallbladder: No gallstones or wall thickening visualized. No sonographic Murphy sign noted by sonographer. Common bile duct: Diameter: Normal caliber, 5 mm Liver: Increased echotexture compatible with fatty infiltration. No focal abnormality or biliary ductal dilatation. Portal vein is patent on color Doppler imaging with normal direction of blood flow towards the liver. IMPRESSION: Fatty infiltration of the liver. No acute findings. Electronically Signed   By: Charlett Nose M.D.   On: 03/16/2018 08:09     Huey Bienenstock M.D on 03/23/2018 at 12:26 PM  Between 7am to 7pm - Pager - [phone]  After 7pm go to [email or web address] - password [facility]  [facility] -  Office  [phone]

## 2018-03-24 DIAGNOSIS — E782 Mixed hyperlipidemia: Secondary | ICD-10-CM

## 2018-03-24 DIAGNOSIS — E11649 Type 2 diabetes mellitus with hypoglycemia without coma: Secondary | ICD-10-CM

## 2018-03-24 LAB — CBC
HEMATOCRIT: 39.6 % (ref 39.0–52.0)
Hemoglobin: 13.5 g/dL (ref 13.0–17.0)
MCH: 30.3 pg (ref 26.0–34.0)
MCHC: 34.1 g/dL (ref 30.0–36.0)
MCV: 88.8 fL (ref 80.0–100.0)
NRBC: 0 % (ref 0.0–0.2)
Platelets: 253 10*3/uL (ref 150–400)
RBC: 4.46 MIL/uL (ref 4.22–5.81)
RDW: 11.8 % (ref 11.5–15.5)
WBC: 9.4 10*3/uL (ref 4.0–10.5)

## 2018-03-24 LAB — GLUCOSE, CAPILLARY
GLUCOSE-CAPILLARY: 106 mg/dL — AB (ref 70–99)
GLUCOSE-CAPILLARY: 168 mg/dL — AB (ref 70–99)
GLUCOSE-CAPILLARY: 57 mg/dL — AB (ref 70–99)
GLUCOSE-CAPILLARY: 58 mg/dL — AB (ref 70–99)
GLUCOSE-CAPILLARY: 99 mg/dL (ref 70–99)
Glucose-Capillary: 229 mg/dL — ABNORMAL HIGH (ref 70–99)
Glucose-Capillary: 62 mg/dL — ABNORMAL LOW (ref 70–99)
Glucose-Capillary: 187 mg/dL — ABNORMAL HIGH (ref 70–99)
Glucose-Capillary: 90 mg/dL (ref 70–99)

## 2018-03-24 LAB — BASIC METABOLIC PANEL
ANION GAP: 11 (ref 5–15)
BUN: <5 mg/dL — ABNORMAL LOW (ref 6–20)
CALCIUM: 8.3 mg/dL — AB (ref 8.9–10.3)
CHLORIDE: 104 mmol/L (ref 98–111)
CO2: 24 mmol/L (ref 22–32)
Creatinine, Ser: 0.9 mg/dL (ref 0.61–1.24)
GFR calc non Af Amer: >60 mL/min (ref >60)
Glucose, Bld: 70 mg/dL (ref 70–99)
POTASSIUM: 2.9 mmol/L — AB (ref 3.5–5.1)
Sodium: 139 mmol/L (ref 135–145)

## 2018-03-24 MED ORDER — HYDROXYZINE HCL 25 MG PO TABS
25.0000 mg | ORAL_TABLET | Freq: Three times a day (TID) | ORAL | Status: DC | PRN
  Administered 2018-03-24: 25 mg via ORAL
  Filled 2018-03-24: qty 1

## 2018-03-24 MED ORDER — POTASSIUM CHLORIDE CRYS ER 20 MEQ PO TBCR
30.0000 meq | EXTENDED_RELEASE_TABLET | Freq: Once | ORAL | Status: AC
  Administered 2018-03-24: 30 meq via ORAL
  Filled 2018-03-24: qty 1

## 2018-03-24 MED ORDER — INSULIN GLARGINE 100 UNIT/ML ~~LOC~~ SOLN: 10.0000 [IU] | Freq: Every day | SUBCUTANEOUS | Status: DC

## 2018-03-24 MED ORDER — DEXTROSE 50 % IV SOLN
INTRAVENOUS | Status: AC
  Administered 2018-03-24: 50 mL
  Filled 2018-03-24: qty 50

## 2018-03-24 MED ORDER — ACETAMINOPHEN 325 MG PO TABS
650.0000 mg | ORAL_TABLET | Freq: Four times a day (QID) | ORAL | Status: DC | PRN
  Administered 2018-03-24: 650 mg via ORAL

## 2018-03-24 MED ORDER — MAGNESIUM SULFATE 2 GM/50ML IV SOLN
2.0000 g | Freq: Once | INTRAVENOUS | Status: AC
  Administered 2018-03-24: 2 g via INTRAVENOUS
  Filled 2018-03-24: qty 50

## 2018-03-24 MED ORDER — ISOSORBIDE MONONITRATE ER 30 MG PO TB24
15.0000 mg | ORAL_TABLET | Freq: Every day | ORAL | Status: DC
  Administered 2018-03-24 – 2018-03-25 (×2): 15 mg via ORAL
  Filled 2018-03-24 (×2): qty 1

## 2018-03-24 MED ORDER — INSULIN GLARGINE 100 UNIT/ML ~~LOC~~ SOLN
10.0000 [IU] | Freq: Every day | SUBCUTANEOUS | Status: DC
  Administered 2018-03-24 (×2): 10 [IU] via SUBCUTANEOUS
  Filled 2018-03-24: qty 0.1

## 2018-03-24 MED ORDER — METOPROLOL TARTRATE 50 MG PO TABS
50.0000 mg | ORAL_TABLET | Freq: Two times a day (BID) | ORAL | Status: DC
  Administered 2018-03-24 – 2018-03-25 (×2): 50 mg via ORAL
  Filled 2018-03-24 (×2): qty 1

## 2018-03-24 MED ORDER — POTASSIUM CHLORIDE 10 MEQ/100ML IV SOLN
10.0000 meq | INTRAVENOUS | Status: DC
  Administered 2018-03-24 (×2): 10 meq via INTRAVENOUS
  Filled 2018-03-24 (×5): qty 100

## 2018-03-24 NOTE — Progress Notes (Addendum)
Hypoglycemic Event  CBG: 58  Treatment: 32 Gram carb snack. 3 apple juice  Symptoms: None  Follow-up CBG: [id number] CBG Result:62  Possible Reasons for Event: Inadequate meal intake   Treatment: gave 1 amp D50.   Re check CBG time: 0917        CBG: 229  Pt ate clear liquid breakfast with no nausea today. Will continue to monitor.    Ozella Rocks P

## 2018-03-24 NOTE — Progress Notes (Signed)
Patient sits with legs crossed in bed, rocking back and forth, crying.  Pt states he doesn't care if he lives.  Patient shares traumas from childhood that have affected his entire life.  This RN notes patient has not been ordered or given Hydroxyzine/Vistaril as noted in PTA med list - physician notified and med ordered.  Administered Vistaril per PRN order.  Patient also tells this RN that he was taking Zoloft and Wellbutrin PTA - this is noted in Office Visit notes during prior visits but were not listed in September, 2019.  Pt states he missed his appointment at [facility] so meds were not refilled.  Patient continues to cry continuously, reiterating the poor health of his father, death of his sister, recalls how he has "always been picked on".  This Clinical research associate recommends potential psych eval if physician finds appropriate.

## 2018-03-24 NOTE — Progress Notes (Signed)
PROGRESS NOTE                                                                                                                                                                                                             Patient Demographics:    [name] [name], is a 47 y.o. male, DOB - [birth year], ZOX:[id number]  Admit date - 03/21/2018   Admitting Physician John Giovanni, MD  Outpatient Primary MD for the patient is [name], PA-C  LOS - 1   Chief Complaint  Patient presents with  . Emesis       Brief Narrative    47 y.o. male with medical history significant of coronary artery disease, COPD, type 2 diabetes, GERD, hypertension, hyperlipidemia presenting to the hospital for further evaluation of nausea, vomiting, and epigastric abdominal pain, patient was noted to have membolic acidosis, but no hyperglycemia noted, was not in DKA, but will stay was significant for significant nausea and vomiting due to gastroparesis, and brittle diabetes with hypoglycemic episode as well.   Subjective:    [name] [name] today with poor oral intake yesterday and vomiting yesterday, but this morning he feels a lot better, he could not tolerate his clear liquid diet breakfast, reports no further nausea or vomiting asking to be advanced soft diet .   Assessment  & Plan :    Principal Problem:   High anion gap metabolic acidosis Active Problems:   Leukocytosis   HTN (hypertension)   Hyperlipidemia   Epigastric abdominal pain   Nausea vomiting and diarrhea   AKI (acute kidney injury) (HCC)   Uncontrolled diabetes mellitus (HCC)  Anion gap metabolic acidosis -Likely in the setting of nausea, vomiting and starvation, unlikely DKA, normal glucose level on presentation, negative glucose urine analysis. -Anion gap has closed with IV fluids. -Does not appear to have DKA  Persistent nausea and vomiting -This is most likely in the setting of gastroparesis,  he had 4 admissions last December for similar presentation, and having significant nausea and vomiting -He should be unable to tolerate any oral intake, appears to be have improved after initiation of IV Reglan, I will continue with diabetic times a day, will advance to soft diet, I will DC IV fluids .  AKI -Renal, resolved, renal function back to baseline, continue with IV fluids  Diabetes mellitus -Brittle diabetes, he  had hypoglycemic episode yesterday and this morning given poor oral intake, I have stopped his Lantus, oral intake has improved, initiated Lantus at bedtime if CBG continues to increase . -I will still keep him on D5 half-normal saline today to the oral intake is reliable and no recurrence of hypoglycemia .  Kalemia -Potassium 2.9, will replete, will give IV magnesium as well, repeat in a.m.   hypertension -Currently normotensive.  Hold home meds at this time.  HLD -Continue home Lipitor  Code Status : Full  Family Communication  : none at bedside  Disposition Plan  : home when stable hopefully next 24 hours if oral intake is reliable  Consults  :  none  Procedures  : none  DVT Prophylaxis  :  lovenox  Lab Results  Component Value Date   PLT 253 03/24/2018    Antibiotics  :    Anti-infectives (From admission, onward)   None        Objective:   Vitals:   03/23/18 1931 03/23/18 2328 03/24/18 0531 03/24/18 0728  BP: (!) 180/99 119/65 139/86 128/88  Pulse: (!) 106 85  73  Resp: 16 (!) 156  20  Temp: 98.5 F (36.9 C) 98.3 F (36.8 C) 98.3 F (36.8 C) 98.3 F (36.8 C)  TempSrc: Oral Oral Oral Oral  SpO2: 94% 96%  97%  Weight:      Height:        Wt Readings from Last 3 Encounters:  03/22/18 74.4 kg  03/16/18 75.6 kg  03/06/18 77.2 kg     Intake/Output Summary (Last 24 hours) at 03/24/2018 1057 Last data filed at 03/24/2018 1000 Gross per 24 hour  Intake 2355 ml  Output 1300 ml  Net 1055 ml     Physical Exam  Awake Alert,  Oriented X 3, No new F.N deficits, Normal affect Symmetrical Chest wall movement, Good air movement bilaterally, CTAB RRR,No Gallops,Rubs or new Murmurs, No Parasternal Heave +ve B.Sounds, Abd Soft, No tenderness, No rebound - guarding or rigidity. No Cyanosis, Clubbing or edema, No new Rash or bruise       Data Review:    CBC Recent Labs  Lab 03/19/18 0326 03/21/18 2323 03/22/18 0244 03/23/18 0255 03/24/18 0206  WBC 16.8* 15.7* 14.5* 9.4 9.4  HGB 14.0 13.9 13.9 14.0 13.5  HCT 43.5 43.4 42.9 41.7 39.6  PLT 218 237 253 237 253  MCV 91.6 92.5 91.7 88.7 88.8  MCH 29.5 29.6 29.7 29.8 30.3  MCHC 32.2 32.0 32.4 33.6 34.1  RDW 12.1 11.9 12.0 11.8 11.8    Chemistries  Recent Labs  Lab 03/19/18 0326 03/21/18 2022  03/22/18 0147 03/22/18 0542 03/22/18 1519 03/23/18 0255 03/24/18 0206  NA 138 137   < > 139 137 140 138 139  K 3.6 5.2*   < > 3.8 4.0 3.5 3.5 2.9*  CL 109 99   < > 108 107 108 105 104  CO2 21* 16*   < > 17* 15* 20* 20* 24  GLUCOSE 168* 124*   < > 107* 182* 105* 121* 70  BUN 13 16   < > 13 11 6  <5* <5*  CREATININE 0.95 1.35*   < > 1.19 1.28* 0.88 0.95 0.90  CALCIUM 8.1* 8.9   < > 7.9* 7.8* 8.3* 8.3* 8.3*  MG 2.0  --   --   --   --   --   --   --   AST 12* 43*  --   --  15 15  --   --   ALT 12 15  --   --  11 10  --   --   ALKPHOS 81 103  --   --  76 88  --   --   BILITOT 1.3* 3.2*  --   --  1.4* 1.3*  --   --    < > = values in this interval not displayed.   ------------------------------------------------------------------------------------------------------------------ No results for input(s): CHOL, HDL, LDLCALC, TRIG, CHOLHDL, LDLDIRECT in the last 72 hours.  Lab Results  Component Value Date   HGBA1C 15.2 (H) 03/17/2018   ------------------------------------------------------------------------------------------------------------------ No results for input(s): TSH, T4TOTAL, T3FREE, THYROIDAB in the last 72 hours.  Invalid input(s):  FREET3 ------------------------------------------------------------------------------------------------------------------ No results for input(s): VITAMINB12, FOLATE, FERRITIN, TIBC, IRON, RETICCTPCT in the last 72 hours.  Coagulation profile No results for input(s): INR, PROTIME in the last 168 hours.  No results for input(s): DDIMER in the last 72 hours.  Cardiac Enzymes No results for input(s): CKMB, TROPONINI, MYOGLOBIN in the last 168 hours.  Invalid input(s): CK ------------------------------------------------------------------------------------------------------------------ No results found for: BNP  Inpatient Medications  Scheduled Meds: . atorvastatin  80 mg Oral Daily  . enoxaparin (LOVENOX) injection  40 mg Subcutaneous Q24H  . famotidine  20 mg Oral BID  . feeding supplement  1 Container Oral TID BM  . feeding supplement (GLUCERNA SHAKE)  237 mL Oral TID BM  . insulin aspart  0-9 Units Subcutaneous TID WC  . metoCLOPramide (REGLAN) injection  5 mg Intravenous Q6H  . potassium chloride  30 mEq Oral Once  . potassium chloride  30 mEq Oral Once   Continuous Infusions: . dextrose 5 % and 0.45% NaCl 30 mL/hr at 03/24/18 0946  . magnesium sulfate 1 - 4 g bolus IVPB     PRN Meds:.alum & mag hydroxide-simeth, hydrALAZINE, ondansetron (ZOFRAN) IV, promethazine  Micro Results Recent Results (from the past 240 hour(s))  MRSA PCR Screening     Status: None   Collection Time: 03/16/18  1:42 PM  Result Value Ref Range Status   MRSA by PCR NEGATIVE NEGATIVE Final    Comment:        The GeneXpert MRSA Assay (FDA approved for NASAL specimens only), is one component of a comprehensive MRSA colonization surveillance program. It is not intended to diagnose MRSA infection nor to guide or monitor treatment for MRSA infections. Performed at [facility], [address], [place], [place] [address]   MRSA PCR Screening     Status: None   Collection Time: 03/22/18  8:35  PM  Result Value Ref Range Status   MRSA by PCR NEGATIVE NEGATIVE Final    Comment:        The GeneXpert MRSA Assay (FDA approved for NASAL specimens only), is one component of a comprehensive MRSA colonization surveillance program. It is not intended to diagnose MRSA infection nor to guide or monitor treatment for MRSA infections. Performed at [facility], [address], [place], [place] [address]     Radiology Reports US Abdomen Complete  Result Date: 03/22/2018 CLINICAL DATA:  Nausea, vomiting, diarrhea. EXAM: ABDOMEN ULTRASOUND COMPLETE COMPARISON:  Right upper quadrant ultrasound 03/16/2018 FINDINGS: Gallbladder: Physiologically distended. No gallstones or wall thickening visualized. No sonographic Murphy sign noted by sonographer. Common bile duct: Diameter: 3 mm, normal. Liver: No focal lesion identified. Diffusely increased and heterogeneous in parenchymal echogenicity. Portal vein is patent on color Doppler imaging with normal direction of blood flow  towards the liver. IVC: No abnormality visualized. Pancreas: Visualized portion unremarkable. Spleen: Size and appearance within normal limits. Right Kidney: Length: 10.6 cm. Echogenicity within normal limits. No mass or hydronephrosis visualized. Left Kidney: Length: 10.8 cm. Echogenicity within normal limits. No mass or hydronephrosis visualized. Abdominal aorta: No aneurysm visualized. Other findings: None.  No ascites. IMPRESSION: 1. Hepatic steatosis. 2. Otherwise unremarkable abdominal ultrasound. Electronically Signed   By: Narda Rutherford M.D.   On: 03/22/2018 04:12   Dg Chest Portable 1 View  Result Date: 03/16/2018 CLINICAL DATA:  47 y/o  M; tachypnea and shortness of breath. EXAM: PORTABLE CHEST 1 VIEW COMPARISON:  06/13/2017 chest radiograph. FINDINGS: Stable heart size and mediastinal contours are within normal limits. Both lungs are clear. Chronic lower right anterolateral rib fractures. No acute osseous  abnormality is evident. IMPRESSION: No active disease. Electronically Signed   By: Mitzi Hansen M.D.   On: 03/16/2018 06:39   Dg Abd Portable 1v  Result Date: 03/17/2018 CLINICAL DATA:  Nausea vomiting EXAM: PORTABLE ABDOMEN - 1 VIEW COMPARISON:  06/13/2017 FINDINGS: The bowel gas pattern is normal. No radio-opaque calculi or other significant radiographic abnormality are seen. IMPRESSION: Negative. Electronically Signed   By: Marlan Palau M.D.   On: 03/17/2018 09:46   US Abdomen Limited Ruq  Result Date: 03/16/2018 CLINICAL DATA:  Right upper quadrant abdominal pain EXAM: ULTRASOUND ABDOMEN LIMITED RIGHT UPPER QUADRANT COMPARISON:  05/24/2017 FINDINGS: Gallbladder: No gallstones or wall thickening visualized. No sonographic Murphy sign noted by sonographer. Common bile duct: Diameter: Normal caliber, 5 mm Liver: Increased echotexture compatible with fatty infiltration. No focal abnormality or biliary ductal dilatation. Portal vein is patent on color Doppler imaging with normal direction of blood flow towards the liver. IMPRESSION: Fatty infiltration of the liver. No acute findings. Electronically Signed   By: Charlett Nose M.D.   On: 03/16/2018 08:09     Huey Bienenstock M.D on 03/24/2018 at 10:57 AM  Between 7am to 7pm - Pager - [phone]  After 7pm go to [email or web address] - password [facility]  [facility] -  Office  [phone]

## 2018-03-24 NOTE — Progress Notes (Signed)
Inpatient Diabetes Program Recommendations  AACE/ADA: New Consensus Statement on Inpatient Glycemic Control (2019)  Target Ranges:  Prepandial:   less than 140 mg/dL      Peak postprandial:   less than 180 mg/dL (1-2 hours)      Critically ill patients:  140 - 180 mg/dL   Results for [name], [name] (MRN [id number]) as of 03/24/2018 09:09  Ref. Range 03/23/2018 08:30 03/23/2018 12:34 03/23/2018 17:21 03/23/2018 18:07 03/23/2018 21:20 03/24/2018 08:17 03/24/2018 08:19 03/24/2018 08:45  Glucose-Capillary Latest Ref Range: 70 - 99 mg/dL 562 (H)  Lantus 25 units @ 9:47 119 (H) 68 (L) 95 82 58 (L) 57 (L) 62 (L)   Review of Glycemic Control  Current orders for Inpatient glycemic control: Novolog 0-9 units TID with meals  Inpatient Diabetes Program Recommendations:  Insulin - Basal: Noted patient received Lantus 25 units at 17:23 on 03/22/18 and then got Lantus 25 units at 9:49 am on 03/23/18. Fasting glucose 57 mg/dl today. Lantus was discontinued. Please consider reordering Lantus at a lower dose.; recommend ordering Lantus 18 units QHS (based on approximately 74.4 kg x 0.25 units). Correction (SSI): Please consider ordering Novolog 0-5 units QHS for bedtime correction scale.  Thanks, Orlando Penner, RN, MSN, CDE Diabetes Coordinator Inpatient Diabetes Program [phone] (Team Pager from 8am to 5pm)

## 2018-03-25 LAB — BASIC METABOLIC PANEL
ANION GAP: 8 (ref 5–15)
BUN: <5 mg/dL — ABNORMAL LOW (ref 6–20)
CALCIUM: 8.3 mg/dL — AB (ref 8.9–10.3)
CO2: 23 mmol/L (ref 22–32)
Chloride: 107 mmol/L (ref 98–111)
Creatinine, Ser: 0.97 mg/dL (ref 0.61–1.24)
GFR calc Af Amer: >60 mL/min (ref >60)
GLUCOSE: 92 mg/dL (ref 70–99)
Potassium: 4.1 mmol/L (ref 3.5–5.1)
Sodium: 138 mmol/L (ref 135–145)

## 2018-03-25 LAB — GLUCOSE, CAPILLARY
GLUCOSE-CAPILLARY: 93 mg/dL (ref 70–99)
GLUCOSE-CAPILLARY: 131 mg/dL — AB (ref 70–99)
Glucose-Capillary: 95 mg/dL (ref 70–99)
Glucose-Capillary: 97 mg/dL (ref 70–99)
Glucose-Capillary: 217 mg/dL — ABNORMAL HIGH (ref 70–99)

## 2018-03-25 MED ORDER — ACETAMINOPHEN 325 MG PO TABS: 650.0000 mg | ORAL_TABLET | Freq: Four times a day (QID) | ORAL | Status: DC | PRN

## 2018-03-25 MED ORDER — INSULIN GLARGINE 100 UNIT/ML SOLOSTAR PEN: 10.0000 [IU] | PEN_INJECTOR | Freq: Every day | SUBCUTANEOUS | 0 refills | Status: DC

## 2018-03-25 NOTE — Progress Notes (Signed)
PROGRESS NOTE                                                                                                                                                                                                             Patient Demographics:    [name] [name], is a 47 y.o. male, DOB - 09-16-1970, WUJ:[id number]  Admit date - 03/21/2018   Admitting Physician John Giovanni, MD  Outpatient Primary MD for the patient is Julio Alm  LOS - 2   Chief Complaint  Patient presents with  . Emesis       Brief Narrative    47 y.o. male with medical history significant of coronary artery disease, COPD, type 2 diabetes, GERD, hypertension, hyperlipidemia presenting to the hospital for further evaluation of nausea, vomiting, and epigastric abdominal pain, patient was noted to have membolic acidosis, but no hyperglycemia noted, was not in DKA, but will stay was significant for significant nausea and vomiting due to gastroparesis, and brittle diabetes with hypoglycemic episode as well.   Subjective:    Drayce Delk today ports his appetite has improved, less nausea, no vomiting.   Assessment  & Plan :    Principal Problem:   High anion gap metabolic acidosis Active Problems:   Leukocytosis   HTN (hypertension)   Hyperlipidemia   Epigastric abdominal pain   Nausea vomiting and diarrhea   AKI (acute kidney injury) (HCC)   Uncontrolled diabetes mellitus (HCC)   Persistent nausea and vomiting -This is most likely in the setting of gastroparesis, he had 4 admissions last December for similar presentation, and having significant nausea and vomiting -No further vomiting, nausea significantly improved, but oral intake still unreliable, but at least she is tolerating liquids.   Diabetes mellitus -Brittle diabetes, hemoglobin A1c of 15.1 this admission, he did have few hypoglycemic episodes despite lowering his Lantus dose from 40 at home to 10  units here, after further discussion with the patient, report he is noncompliant with diet at home, at least he drinks 2 L of regular(NOT DIET) Dr. Reino Kent soda daily , is most likely contributing to his uncontrolled CBG and Lantus dose . -He received 10 units Lantus yesterday, still requiring D5, so I will discontinue his Lantus and hope I can DC his D5 half-normal, and start on lower dose Lantus once if fluid .  AKI -Renal, resolved, renal function back to baseline, continue with IV fluids -Resolved  Anion gap metabolic acidosis -Likely in the setting of nausea, vomiting and starvation, unlikely DKA, normal glucose level on presentation, negative glucose urine analysis. -Anion gap has closed with IV fluids. -Does not appear to have DKA  Hypokalemia -Potassium 2.9, Repleted.  hypertension -Usually meds on hold given soft blood pressure, started to increase, improved on Imdur and metoprolol  HLD -Continue home Lipitor  Code Status : Full  Family Communication  : none at bedside  Disposition Plan  : home when stable   Consults  :  none  Procedures  : none  DVT Prophylaxis  :  lovenox  Lab Results  Component Value Date   PLT 253 03/24/2018    Antibiotics  :    Anti-infectives (From admission, onward)   None        Objective:   Vitals:   03/24/18 2208 03/24/18 2346 03/25/18 0430 03/25/18 0808  BP: 118/78 90/64 101/70 107/74  Pulse: 98 90 75 77  Resp:  18 18 17   Temp:  99.2 F (37.3 C) 98.7 F (37.1 C) 98.4 F (36.9 C)  TempSrc:  Oral Oral Oral  SpO2:  100% 95% 94%  Weight:      Height:        Wt Readings from Last 3 Encounters:  03/22/18 74.4 kg  03/16/18 75.6 kg  03/06/18 77.2 kg     Intake/Output Summary (Last 24 hours) at 03/25/2018 1052 Last data filed at 03/25/2018 0600 Gross per 24 hour  Intake 1030 ml  Output 1050 ml  Net -20 ml     Physical Exam  Awake Alert, Oriented X 3, No new F.N deficits, Normal affect Symmetrical Chest wall  movement, Good air movement bilaterally, CTAB RRR,No Gallops,Rubs or new Murmurs, No Parasternal Heave +ve B.Sounds, Abd Soft, No tenderness, No rebound - guarding or rigidity. No Cyanosis, Clubbing or edema, No new Rash or bruise        Data Review:    CBC Recent Labs  Lab 03/19/18 0326 03/21/18 2323 03/22/18 0244 03/23/18 0255 03/24/18 0206  WBC 16.8* 15.7* 14.5* 9.4 9.4  HGB 14.0 13.9 13.9 14.0 13.5  HCT 43.5 43.4 42.9 41.7 39.6  PLT 218 237 253 237 253  MCV 91.6 92.5 91.7 88.7 88.8  MCH 29.5 29.6 29.7 29.8 30.3  MCHC 32.2 32.0 32.4 33.6 34.1  RDW 12.1 11.9 12.0 11.8 11.8    Chemistries  Recent Labs  Lab 03/19/18 0326 03/21/18 2022  03/22/18 0542 03/22/18 1519 03/23/18 0255 03/24/18 0206 03/25/18 0256  NA 138 137   < > 137 140 138 139 138  K 3.6 5.2*   < > 4.0 3.5 3.5 2.9* 4.1  CL 109 99   < > 107 108 105 104 107  CO2 21* 16*   < > 15* 20* 20* 24 23  GLUCOSE 168* 124*   < > 182* 105* 121* 70 92  BUN 13 16   < > 11 6 <5* <5* <5*  CREATININE 0.95 1.35*   < > 1.28* 0.88 0.95 0.90 0.97  CALCIUM 8.1* 8.9   < > 7.8* 8.3* 8.3* 8.3* 8.3*  MG 2.0  --   --   --   --   --   --   --   AST 12* 43*  --  15 15  --   --   --   ALT 12 15  --  11 10  --   --   --  ALKPHOS 81 103  --  76 88  --   --   --   BILITOT 1.3* 3.2*  --  1.4* 1.3*  --   --   --    < > = values in this interval not displayed.   ------------------------------------------------------------------------------------------------------------------ No results for input(s): CHOL, HDL, LDLCALC, TRIG, CHOLHDL, LDLDIRECT in the last 72 hours.  Lab Results  Component Value Date   HGBA1C 15.2 (H) 03/17/2018   ------------------------------------------------------------------------------------------------------------------ No results for input(s): TSH, T4TOTAL, T3FREE, THYROIDAB in the last 72 hours.  Invalid input(s):  FREET3 ------------------------------------------------------------------------------------------------------------------ No results for input(s): VITAMINB12, FOLATE, FERRITIN, TIBC, IRON, RETICCTPCT in the last 72 hours.  Coagulation profile No results for input(s): INR, PROTIME in the last 168 hours.  No results for input(s): DDIMER in the last 72 hours.  Cardiac Enzymes No results for input(s): CKMB, TROPONINI, MYOGLOBIN in the last 168 hours.  Invalid input(s): CK ------------------------------------------------------------------------------------------------------------------ No results found for: BNP  Inpatient Medications  Scheduled Meds: . atorvastatin  80 mg Oral Daily  . enoxaparin (LOVENOX) injection  40 mg Subcutaneous Q24H  . famotidine  20 mg Oral BID  . feeding supplement  1 Container Oral TID BM  . feeding supplement (GLUCERNA SHAKE)  237 mL Oral TID BM  . insulin aspart  0-9 Units Subcutaneous TID WC  . insulin glargine  10 Units Subcutaneous QHS  . isosorbide mononitrate  15 mg Oral Daily  . metoprolol tartrate  50 mg Oral BID   Continuous Infusions: . dextrose 5 % and 0.45% NaCl 50 mL/hr at 03/24/18 2300   PRN Meds:.acetaminophen, alum & mag hydroxide-simeth, hydrALAZINE, hydrOXYzine, ondansetron (ZOFRAN) IV, promethazine  Micro Results Recent Results (from the past 240 hour(s))  MRSA PCR Screening     Status: None   Collection Time: 03/16/18  1:42 PM  Result Value Ref Range Status   MRSA by PCR NEGATIVE NEGATIVE Final    Comment:        The GeneXpert MRSA Assay (FDA approved for NASAL specimens only), is one component of a comprehensive MRSA colonization surveillance program. It is not intended to diagnose MRSA infection nor to guide or monitor treatment for MRSA infections. Performed at [facility], [address], [place], [place] [address]   MRSA PCR Screening     Status: None   Collection Time: 03/22/18  8:35 PM  Result Value Ref  Range Status   MRSA by PCR NEGATIVE NEGATIVE Final    Comment:        The GeneXpert MRSA Assay (FDA approved for NASAL specimens only), is one component of a comprehensive MRSA colonization surveillance program. It is not intended to diagnose MRSA infection nor to guide or monitor treatment for MRSA infections. Performed at [facility], [address], [place], [place] [address]     Radiology Reports US Abdomen Complete  Result Date: 03/22/2018 CLINICAL DATA:  Nausea, vomiting, diarrhea. EXAM: ABDOMEN ULTRASOUND COMPLETE COMPARISON:  Right upper quadrant ultrasound 03/16/2018 FINDINGS: Gallbladder: Physiologically distended. No gallstones or wall thickening visualized. No sonographic Murphy sign noted by sonographer. Common bile duct: Diameter: 3 mm, normal. Liver: No focal lesion identified. Diffusely increased and heterogeneous in parenchymal echogenicity. Portal vein is patent on color Doppler imaging with normal direction of blood flow towards the liver. IVC: No abnormality visualized. Pancreas: Visualized portion unremarkable. Spleen: Size and appearance within normal limits. Right Kidney: Length: 10.6 cm. Echogenicity within normal limits. No mass or hydronephrosis visualized. Left Kidney: Length: 10.8 cm. Echogenicity  within normal limits. No mass or hydronephrosis visualized. Abdominal aorta: No aneurysm visualized. Other findings: None.  No ascites. IMPRESSION: 1. Hepatic steatosis. 2. Otherwise unremarkable abdominal ultrasound. Electronically Signed   By: Narda Rutherford M.D.   On: 03/22/2018 04:12   Dg Chest Portable 1 View  Result Date: 03/16/2018 CLINICAL DATA:  47 y/o  M; tachypnea and shortness of breath. EXAM: PORTABLE CHEST 1 VIEW COMPARISON:  06/13/2017 chest radiograph. FINDINGS: Stable heart size and mediastinal contours are within normal limits. Both lungs are clear. Chronic lower right anterolateral rib fractures. No acute osseous abnormality is evident.  IMPRESSION: No active disease. Electronically Signed   By: Mitzi Hansen M.D.   On: 03/16/2018 06:39   Dg Abd Portable 1v  Result Date: 03/17/2018 CLINICAL DATA:  Nausea vomiting EXAM: PORTABLE ABDOMEN - 1 VIEW COMPARISON:  06/13/2017 FINDINGS: The bowel gas pattern is normal. No radio-opaque calculi or other significant radiographic abnormality are seen. IMPRESSION: Negative. Electronically Signed   By: Marlan Palau M.D.   On: 03/17/2018 09:46   US Abdomen Limited Ruq  Result Date: 03/16/2018 CLINICAL DATA:  Right upper quadrant abdominal pain EXAM: ULTRASOUND ABDOMEN LIMITED RIGHT UPPER QUADRANT COMPARISON:  05/24/2017 FINDINGS: Gallbladder: No gallstones or wall thickening visualized. No sonographic Murphy sign noted by sonographer. Common bile duct: Diameter: Normal caliber, 5 mm Liver: Increased echotexture compatible with fatty infiltration. No focal abnormality or biliary ductal dilatation. Portal vein is patent on color Doppler imaging with normal direction of blood flow towards the liver. IMPRESSION: Fatty infiltration of the liver. No acute findings. Electronically Signed   By: Charlett Nose M.D.   On: 03/16/2018 08:09     Huey Bienenstock M.D on 03/25/2018 at 10:52 AM  Between 7am to 7pm - Pager - [phone]  After 7pm go to [email or web address] - password [facility]  [facility] -  Office  [phone]

## 2018-03-25 NOTE — Plan of Care (Signed)
Discharge education given to patient with verbal teach back about new orders for insulin administration. Pt understood teaching/education.

## 2018-03-25 NOTE — Discharge Summary (Signed)
[name] [name], is a 47 y.o. male  DOB 03-12-1971  MRN [id number].  Admission date:  03/21/2018  Admitting Physician  [name] Giovanni, MD  Discharge Date:  03/25/2018   Primary MD  [name] Hawking, PA-C  Recommendations for primary care physician for things to follow:  -Please repeat CBC, BMP during next visit, to monitor his CBGs closely and adjust insulin regimen as needed continue counseling about diet compliance.   Admission Diagnosis  Gastroparesis [K31.84] Dehydration [E86.0] Nausea and vomiting in adult [R11.2] Increased anion gap metabolic acidosis [E87.2] Nausea vomiting and diarrhea [R11.2, R19.7] Diabetic ketoacidosis without coma associated with diabetes mellitus due to underlying condition (HCC) [E08.10] High anion gap metabolic acidosis [E87.2]   Discharge Diagnosis gastroparesis  Principal Problem:   High anion gap metabolic acidosis Active Problems:   Leukocytosis   HTN (hypertension)   Hyperlipidemia   Epigastric abdominal pain   Nausea vomiting and diarrhea   AKI (acute kidney injury) (HCC)   Uncontrolled diabetes mellitus (HCC)      Past Medical History:  Diagnosis Date  . Anxiety   . Arthritis    "hands & feet" (03/16/2018)  . CAD (coronary artery disease)    Moderate LAD disease 2016 - Dr. Jacinto [name]  . Chronic bronchitis (HCC)   . Chronic upper back pain   . COPD (chronic obstructive pulmonary disease) (HCC)   . Daily headache    (03/16/2018)  . Depression   . Diabetic peripheral neuropathy (HCC)   . DKA, type 2 (HCC)   . Family history of adverse reaction to anesthesia    "my dad didn't go to sleep; he couldn't move but was awake during colonoscopy" (03/16/2018)  . GERD (gastroesophageal reflux disease)   . History of gout   . Hyperlipemia   . Hypertension   . Migraine    "weekly" (03/16/2018)  . 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 Sleep apnea 2016   "insurance wouldn't pay for mask" (03/16/2018)  . Type 2 diabetes mellitus (HCC)     Past Surgical History:  Procedure Laterality Date  . ANKLE SURGERY Right 1982   "had extra bones in there; took them out"  . APPENDECTOMY  1975  . CARDIAC CATHETERIZATION N/A 03/28/2015   Procedure: Left Heart Cath and Coronary Angiography;  Surgeon: [name] Decamp, MD;  Location: [facility] INVASIVE CV LAB;  Service: Cardiovascular;  Laterality: N/A;  . CARDIAC CATHETERIZATION N/A 03/28/2015   Procedure: Intravascular Pressure Wire/FFR Study;  Surgeon: [name] Decamp, MD;  Location: [facility] INVASIVE CV LAB;  Service: Cardiovascular;  Laterality: N/A;  . CARPAL TUNNEL RELEASE Left ~ 2008  . COLONOSCOPY WITH ESOPHAGOGASTRODUODENOSCOPY (EGD)    . ELBOW FRACTURE SURGERY Left ~ 2008  . FRACTURE SURGERY    . PILONIDAL CYST EXCISION N/A 03/24/2017   Procedure: EXCISION CHRONIC  PILONIDAL ABSCESS;  Surgeon: [name] Miyamoto, MD;  Location: [facility] ORS;  Service: General;  Laterality: N/A;  . TENDON REPAIR Left ~ 2004   "main tendon in my ankle"  History of present illness and  Hospital Course:     Kindly see H&P for history of present illness and admission details, please review complete Labs, Consult reports and Test reports for all details in brief  HPI  from the history and physical done on the day of admission 03/21/2018 HPI: [name] [name] is a 47 y.o. male with medical history significant of coronary artery disease, COPD, type 2 diabetes, GERD, hypertension, hyperlipidemia presenting to the hospital for further evaluation of nausea, vomiting, and epigastric abdominal pain.  Patient states he continued to have nausea and bilious vomiting since leaving the hospital.  Also having epigastric abdominal pain and has reflux.  He has not been able to eat or drink.  He had 3 episodes of brown watery diarrhea last night.  Denies taking any laxatives.  Also having chills.  States he has been taking all of his home medications as  prescribed.  Patient was admitted from 10/17-10/20 for DKA and severe diabetic gastroparesis.  ED Course: Afebrile, slightly tachycardic.  Remainder of vitals stable in the ED.  Blood glucose 124.  White count 15.7; was 16.8 at the time of his discharge 3 days ago.  Bicarb 16 on initial labs but repeat labs revealed bicarb 13.  Anion gap 22 on initial labs; 17 on repeat.  Potassium 5.2 and initial labs in 3.9 on repeat.  Creatinine 1.2; baseline 0.9.  Calcium 7.6 on repeat labs but normal on initial CMP.  Lipase normal.  I-STAT troponin negative.  Patient received insulin, Reglan, Dilaudid, Pepcid, IV potassium, and IV fluid boluses in the ED. TR[facility] paged to admit.     Hospital Course   47 y.o.malewith medical history significant ofcoronary artery disease, COPD, type 2 diabetes, GERD, hypertension, hyperlipidemia presenting to the hospital for further evaluation of nausea, vomiting, and epigastric abdominal pain, patient was noted to have membolic acidosis, but no hyperglycemia noted, was not in DKA, but will stay was significant for significant nausea and vomiting due to gastroparesis, and brittle diabetes with hypoglycemic episode as well.   Persistent nausea and vomiting -This is most likely in the setting of gastroparesis, he had 4 admissions last December for similar presentation, and having significant nausea and vomiting -No further vomiting, nausea significantly improved, was on clear liquid diet, advance to soft yesterday, he did tolerate his soft diet this lunch, patient reports he is feeling well, denies any further nausea or vomiting and asking to be discharged home today, which I think is appropriate, he was recommended to come back to ED if he can tolerate his diet.   Diabetes mellitus -Brittle diabetes, hemoglobin A1c of 15.1 this admission, he did have few hypoglycemic episodes despite lowering his Lantus dose from 40 at home to 10 units here, after further discussion with  the patient, report he is noncompliant with diet at home, at least he drinks 2 L of regular(NOT DIET) [name] [name] soda daily , is most likely contributing to his uncontrolled CBG and Lantus dose . -He received 10 units Lantus yesterday, which still caused low CBGs when he did require some D5, only his oral intake is reliable, so I have instructed him to resume 10 units at bedtime, I have told him if he is noncompliant with his diet, likely his CBG will start to increase as well his Lantus requirement. -Resume metformin on discharge.  AKI -Renal, resolved, renal function back to baseline, continue with IV fluids -Resolved  Anion gap metabolic acidosis -Likely in the setting of nausea,  vomiting and starvation, unlikely DKA, normal glucose level on presentation, negative glucose urine analysis. -Anion gap has closed with IV fluids. -Does not appear to have DKA  Hypokalemia -Potassium 2.9, Repleted.  hypertension -Usually meds on hold given soft blood pressure, started to increase, improved on Imdur and metoprolol  HLD -Continue home Lipitor   Discharge Condition:  Stable   Follow UP  Follow-up Information    [name] Hawking, PA-C Follow up in 1 week(s).   Specialty:  Physician Assistant Contact information: [address] [place] [address] [phone]             Discharge Instructions  and  Discharge Medications     Discharge Instructions    Discharge instructions   Complete by:  As directed    Follow with Primary MD [name] Hawking, PA-C in 7 days   Get CBC, CMP, 2 view Chest X ray checked  by Primary MD next visit.    Activity: As tolerated with Full fall precautions use walker/cane & assistance as needed   Disposition Home    Diet: Heart Healthy , carbohydrate modified diet, with feeding assistance and aspiration precautions   On your next visit with your primary care physician please Get Medicines reviewed and adjusted.   Please  request your Prim.MD to go over all Hospital Tests and Procedure/Radiological results at the follow up, please get all Hospital records sent to your Prim MD by signing hospital release before you go home.   If you experience worsening of your admission symptoms, develop shortness of breath, life threatening emergency, suicidal or homicidal thoughts you must seek medical attention immediately by calling 911 or calling your MD immediately  if symptoms less severe.  You Must read complete instructions/literature along with all the possible adverse reactions/side effects for all the Medicines you take and that have been prescribed to you. Take any new Medicines after you have completely understood and accpet all the possible adverse reactions/side effects.   Do not drive, operating heavy machinery, perform activities at heights, swimming or participation in water activities or provide baby sitting services if your were admitted for syncope or siezures until you have seen by Primary MD or a Neurologist and advised to do so again.  Do not drive when taking Pain medications.    Do not take more than prescribed Pain, Sleep and Anxiety Medications  Special Instructions: If you have smoked or chewed Tobacco  in the last 2 yrs please stop smoking, stop any regular Alcohol  and or any Recreational drug use.  Wear Seat belts while driving.   Please note  You were cared for by a hospitalist during your hospital stay. If you have any questions about your discharge medications or the care you received while you were in the hospital after you are discharged, you can call the unit and asked to speak with the hospitalist on call if the hospitalist that took care of you is not available. Once you are discharged, your primary care physician will handle any further medical issues. Please note that NO REFILLS for any discharge medications will be authorized once you are discharged, as it is imperative that you return  to your primary care physician (or establish a relationship with a primary care physician if you do not have one) for your aftercare needs so that they can reassess your need for medications and monitor your lab values.   Increase activity slowly   Complete by:  As directed  Allergies as of 03/25/2018      Reactions   Omeprazole Magnesium Swelling   Face swells, no breathing impairment   Esomeprazole Swelling   Face swells, no breathing impairment      Medication List    TAKE these medications   acetaminophen 325 MG tablet Commonly known as:  TYLENOL Take 2 tablets (650 mg total) by mouth every 6 (six) hours as needed for moderate pain.   aspirin 81 MG tablet Take 81 mg by mouth daily.   atorvastatin 80 MG tablet Commonly known as:  LIPITOR Take 1 tablet (80 mg total) by mouth daily.   famotidine 20 MG tablet Commonly known as:  PEPCID Take 1 tablet (20 mg total) by mouth 2 (two) times daily.   hydrOXYzine 25 MG tablet Commonly known as:  ATARAX/VISTARIL Take 25 mg by mouth 3 (three) times daily as needed for anxiety.   insulin aspart 100 UNIT/ML injection Commonly known as:  novoLOG Before each meal 3 times a day, 140-199 - 2 units, 200-250 - 4 units, 251-299 - 6 units,  300-349 - 8 units,  350 or above 10 units. Dispense syringes and needles as needed, Ok to switch to PEN if approved. Substitute to any brand approved. DX DM2, Code E11.65   Insulin Glargine 100 UNIT/ML Solostar Pen Commonly known as:  LANTUS Inject 10 Units into the skin at bedtime. What changed:  how much to take   isosorbide mononitrate 30 MG 24 hr tablet Commonly known as:  IMDUR Take 0.5 tablets (15 mg total) by mouth daily.   metFORMIN 1000 MG tablet Commonly known as:  GLUCOPHAGE TAKE 1 Tablet  BY MOUTH TWICE DAILY WITH MEALS   metoCLOPramide 5 MG tablet Commonly known as:  REGLAN Take 1 tablet (5 mg total) by mouth 3 (three) times daily before meals.   metoprolol tartrate 50 MG  tablet Commonly known as:  LOPRESSOR Take 1 tablet (50 mg total) by mouth 2 (two) times daily.   nitroGLYCERIN 0.4 MG SL tablet Commonly known as:  NITROSTAT Place 1 tablet (0.4 mg total) under the tongue every 5 (five) minutes as needed for chest pain.   promethazine 25 MG suppository Commonly known as:  PHENERGAN Place 1 suppository (25 mg total) rectally every 6 (six) hours as needed for nausea.         Diet and Activity recommendation: See Discharge Instructions above   Consults obtained -  None   Major procedures and Radiology Reports - PLEASE review detailed and final reports for all details, in brief -     US Abdomen Complete  Result Date: 03/22/2018 CLINICAL DATA:  Nausea, vomiting, diarrhea. EXAM: ABDOMEN ULTRASOUND COMPLETE COMPARISON:  Right upper quadrant ultrasound 03/16/2018 FINDINGS: Gallbladder: Physiologically distended. No gallstones or wall thickening visualized. No sonographic Murphy sign noted by sonographer. Common bile duct: Diameter: 3 mm, normal. Liver: No focal lesion identified. Diffusely increased and heterogeneous in parenchymal echogenicity. Portal vein is patent on color Doppler imaging with normal direction of blood flow towards the liver. IVC: No abnormality visualized. Pancreas: Visualized portion unremarkable. Spleen: Size and appearance within normal limits. Right Kidney: Length: 10.6 cm. Echogenicity within normal limits. No mass or hydronephrosis visualized. Left Kidney: Length: 10.8 cm. Echogenicity within normal limits. No mass or hydronephrosis visualized. Abdominal aorta: No aneurysm visualized. Other findings: None.  No ascites. IMPRESSION: 1. Hepatic steatosis. 2. Otherwise unremarkable abdominal ultrasound. Electronically Signed   By: Narda Rutherford M.D.   On: 03/22/2018 04:12   Dg Chest  Portable 1 View  Result Date: 03/16/2018 CLINICAL DATA:  47 y/o  M; tachypnea and shortness of breath. EXAM: PORTABLE CHEST 1 VIEW COMPARISON:   06/13/2017 chest radiograph. FINDINGS: Stable heart size and mediastinal contours are within normal limits. Both lungs are clear. Chronic lower right anterolateral rib fractures. No acute osseous abnormality is evident. IMPRESSION: No active disease. Electronically Signed   By: Mitzi Hansen M.D.   On: 03/16/2018 06:39   Dg Abd Portable 1v  Result Date: 03/17/2018 CLINICAL DATA:  Nausea vomiting EXAM: PORTABLE ABDOMEN - 1 VIEW COMPARISON:  06/13/2017 FINDINGS: The bowel gas pattern is normal. No radio-opaque calculi or other significant radiographic abnormality are seen. IMPRESSION: Negative. Electronically Signed   By: Marlan Palau M.D.   On: 03/17/2018 09:46   US Abdomen Limited Ruq  Result Date: 03/16/2018 CLINICAL DATA:  Right upper quadrant abdominal pain EXAM: ULTRASOUND ABDOMEN LIMITED RIGHT UPPER QUADRANT COMPARISON:  05/24/2017 FINDINGS: Gallbladder: No gallstones or wall thickening visualized. No sonographic Murphy sign noted by sonographer. Common bile duct: Diameter: Normal caliber, 5 mm Liver: Increased echotexture compatible with fatty infiltration. No focal abnormality or biliary ductal dilatation. Portal vein is patent on color Doppler imaging with normal direction of blood flow towards the liver. IMPRESSION: Fatty infiltration of the liver. No acute findings. Electronically Signed   By: Charlett Nose M.D.   On: 03/16/2018 08:09    Micro Results    Recent Results (from the past 240 hour(s))  MRSA PCR Screening     Status: None   Collection Time: 03/16/18  1:42 PM  Result Value Ref Range Status   MRSA by PCR NEGATIVE NEGATIVE Final    Comment:        The GeneXpert MRSA Assay (FDA approved for NASAL specimens only), is one component of a comprehensive MRSA colonization surveillance program. It is not intended to diagnose MRSA infection nor to guide or monitor treatment for MRSA infections. Performed at [facility], [address], [place], [place]  [address]   MRSA PCR Screening     Status: None   Collection Time: 03/22/18  8:35 PM  Result Value Ref Range Status   MRSA by PCR NEGATIVE NEGATIVE Final    Comment:        The GeneXpert MRSA Assay (FDA approved for NASAL specimens only), is one component of a comprehensive MRSA colonization surveillance program. It is not intended to diagnose MRSA infection nor to guide or monitor treatment for MRSA infections. Performed at [facility], [address] [place], [place] [address]        Today   Subjective:   [name] [name] today has no headache,no chest pain, no abdominal pain, he did tolerate lunch very well, denies any nausea or vomiting currently, asking if he can go home today .  Objective:   Blood pressure 106/73, pulse 94, temperature 98.5 F (36.9 C), temperature source Oral, resp. rate 16, height 5\' 8"  (1.727 m), weight 74.4 kg, SpO2 92 %.   Intake/Output Summary (Last 24 hours) at 03/25/2018 1559 Last data filed at 03/25/2018 0900 Gross per 24 hour  Intake 670 ml  Output 1050 ml  Net -380 ml    Exam Awake Alert, Oriented x 3, No new F.N deficits, Normal affect Symmetrical Chest wall movement, Good air movement bilaterally, CTAB RRR,No Gallops,Rubs or new Murmurs, No Parasternal Heave +ve B.Sounds, Abd Soft, Non tender, No organomegaly appriciated, No rebound -guarding or rigidity. No Cyanosis, Clubbing or edema, No new  Rash or bruise  Data Review   CBC w Diff:  Lab Results  Component Value Date   WBC 9.4 03/24/2018   HGB 13.5 03/24/2018   HCT 39.6 03/24/2018   PLT 253 03/24/2018   LYMPHOPCT 21 08/22/2017   MONOPCT 3 08/22/2017   EOSPCT 2 08/22/2017   BASOPCT 0 08/22/2017    CMP:  Lab Results  Component Value Date   NA 138 03/25/2018   K 4.1 03/25/2018   CL 107 03/25/2018   CO2 23 03/25/2018   BUN <5 (L) 03/25/2018   CREATININE 0.97 03/25/2018   PROT 6.3 (L) 03/22/2018   ALBUMIN 2.9 (L) 03/22/2018   BILITOT 1.3 (H) 03/22/2018    ALKPHOS 88 03/22/2018   AST 15 03/22/2018   ALT 10 03/22/2018  .   Total Time in preparing paper work, data evaluation and todays exam - 35 minutes  Huey Bienenstock M.D on 03/25/2018 at 3:59 PM  [facility]   Office  [phone]

## 2018-03-30 ENCOUNTER — Other Ambulatory Visit (HOSPITAL_COMMUNITY)
Admission: RE | Admit: 2018-03-30 | Discharge: 2018-03-30 | Disposition: A | Discharge status: Home / Self Care | Payer: Medicaid Other | Source: Ambulatory Visit | Attending: Physician Assistant | Admitting: Physician Assistant

## 2018-03-30 DIAGNOSIS — E1165 Type 2 diabetes mellitus with hyperglycemia: Secondary | ICD-10-CM | POA: Insufficient documentation

## 2018-03-30 DIAGNOSIS — I10 Essential (primary) hypertension: Secondary | ICD-10-CM

## 2018-03-30 LAB — COMPREHENSIVE METABOLIC PANEL
ALBUMIN: 3.5 g/dL (ref 3.5–5.0)
ALT: 14 U/L (ref 0–44)
ANION GAP: 14 (ref 5–15)
AST: 16 U/L (ref 15–41)
Alkaline Phosphatase: 83 U/L (ref 38–126)
BUN: 22 mg/dL — AB (ref 6–20)
CHLORIDE: 95 mmol/L — AB (ref 98–111)
CO2: 27 mmol/L (ref 22–32)
Calcium: 8.7 mg/dL — ABNORMAL LOW (ref 8.9–10.3)
Creatinine, Ser: 1.19 mg/dL (ref 0.61–1.24)
GFR calc Af Amer: >60 mL/min (ref >60)
GFR calc non Af Amer: >60 mL/min (ref >60)
GLUCOSE: 69 mg/dL — AB (ref 70–99)
Potassium: 3.8 mmol/L (ref 3.5–5.1)
SODIUM: 136 mmol/L (ref 135–145)
Total Bilirubin: 0.9 mg/dL (ref 0.3–1.2)
Total Protein: 7.1 g/dL (ref 6.5–8.1)

## 2018-03-30 LAB — LIPID PANEL
Cholesterol: 92 mg/dL (ref 0–200)
HDL: 30 mg/dL — AB (ref >40)
LDL Cholesterol: 37 mg/dL (ref 0–99)
Total CHOL/HDL Ratio: 3.1 RATIO
Triglycerides: 127 mg/dL (ref <150)
VLDL: 25 mg/dL (ref 0–40)

## 2018-03-30 LAB — HEMOGLOBIN A1C
Hgb A1c MFr Bld: 12.6 % — ABNORMAL HIGH (ref 4.8–5.6)
MEAN PLASMA GLUCOSE: 314.92 mg/dL

## 2018-04-03 ENCOUNTER — Encounter: Payer: Self-pay | Admitting: Physician Assistant

## 2018-04-03 ENCOUNTER — Ambulatory Visit: Payer: Medicaid Other | Admitting: Physician Assistant

## 2018-04-03 VITALS — BP 95/72 | HR 121 | Temp 97.7°F | Ht 68.0 in | Wt 154.0 lb

## 2018-04-03 DIAGNOSIS — E1165 Type 2 diabetes mellitus with hyperglycemia: Secondary | ICD-10-CM

## 2018-04-03 DIAGNOSIS — E785 Hyperlipidemia, unspecified: Secondary | ICD-10-CM

## 2018-04-03 DIAGNOSIS — E860 Dehydration: Secondary | ICD-10-CM

## 2018-04-03 DIAGNOSIS — I25119 Atherosclerotic heart disease of native coronary artery with unspecified angina pectoris: Secondary | ICD-10-CM

## 2018-04-03 DIAGNOSIS — R110 Nausea: Secondary | ICD-10-CM

## 2018-04-03 LAB — GLUCOSE, POCT (MANUAL RESULT ENTRY): POC Glucose: 162 mg/dl — AB (ref 70–99)

## 2018-04-03 MED ORDER — PROMETHAZINE HCL 25 MG/ML IJ SOLN
25.0000 mg | Freq: Once | INTRAMUSCULAR | Status: AC
  Administered 2018-04-03: 25 mg via INTRAMUSCULAR

## 2018-04-03 MED ORDER — PROMETHAZINE HCL 25 MG PO TABS: 25.0000 mg | ORAL_TABLET | Freq: Three times a day (TID) | ORAL | 0 refills | Status: DC | PRN

## 2018-04-03 NOTE — Progress Notes (Signed)
BP 95/72 (BP Location: Left Arm, Patient Position: Sitting, Cuff Size: Normal)   Pulse (!) 121   Temp 97.7 F (36.5 C)   Ht 5\' 8"  (1.727 m)   Wt 154 lb (69.9 kg)   SpO2 99%   BMI 23.42 kg/m    Subjective:    Patient ID: [name] [name], male    DOB: 07-12-1970, 47 y.o.   MRN: [id number]  HPI: [name] [name] is a 47 y.o. male presenting on 04/03/2018 for Diabetes and Gastroesophageal Reflux   HPI   Pt says he isn't feeliing well today.  Pt has been admited with DKA twice since he was seen here last on 03/06/18.  Most recently discharged on 03/25/18.  Pt is dizzy and light-headed.  Denies emesis. + nausea.  Pt says he feels like he is going to fall over when he tries to get up and walk around  Pt says he is using some phenergan suppositories and it eases up the nausea but then it returns again.    He didn't get his rx filled because it's too expensive but was using some other his mother's.   He isn't eating much.  He Ate some beans & weanies yesterday.  He is not using his mealtime insulin.  He says he is using 25u lantus.     He says he has not been drinkiing alcohol.    Relevant past medical, surgical, family and social history reviewed and updated as indicated. Interim medical history since our last visit reviewed. Allergies and medications reviewed and updated.    Current Outpatient Medications:  .  acetaminophen (TYLENOL) 325 MG tablet, Take 2 tablets (650 mg total) by mouth every 6 (six) hours as needed for moderate pain., Disp: , Rfl:  .  aspirin 81 MG tablet, Take 81 mg by mouth daily., Disp: , Rfl:  .  atorvastatin (LIPITOR) 80 MG tablet, Take 1 tablet (80 mg total) by mouth daily., Disp: 90 tablet, Rfl: 3 .  Insulin Glargine (LANTUS SOLOSTAR) 100 UNIT/ML Solostar Pen, Inject 10 Units into the skin at bedtime. (Patient taking differently: Inject 25 Units into the skin at bedtime. ), Disp: 1 pen, Rfl: 0 .  isosorbide mononitrate (IMDUR) 30 MG 24 hr tablet, Take 0.5  tablets (15 mg total) by mouth daily., Disp: 45 tablet, Rfl: 3 .  metFORMIN (GLUCOPHAGE) 1000 MG tablet, TAKE 1 Tablet  BY MOUTH TWICE DAILY WITH MEALS, Disp: 180 tablet, Rfl: 0 .  metoCLOPramide (REGLAN) 5 MG tablet, Take 1 tablet (5 mg total) by mouth 3 (three) times daily before meals., Disp: 90 tablet, Rfl: 0 .  sertraline (ZOLOFT) 100 MG tablet, Take 200 mg by mouth daily., Disp: , Rfl:  .  famotidine (PEPCID) 20 MG tablet, Take 1 tablet (20 mg total) by mouth 2 (two) times daily. (Patient not taking: Reported on 04/03/2018), Disp: 60 tablet, Rfl: 6 .  hydrOXYzine (ATARAX/VISTARIL) 25 MG tablet, Take 25 mg by mouth 3 (three) times daily as needed for anxiety. , Disp: , Rfl:  .  insulin aspart (NOVOLOG) 100 UNIT/ML injection, Before each meal 3 times a day, 140-199 - 2 units, 200-250 - 4 units, 251-299 - 6 units,  300-349 - 8 units,  350 or above 10 units. Dispense syringes and needles as needed, Ok to switch to PEN if approved. Substitute to any brand approved. DX DM2, Code E11.65 (Patient not taking: Reported on 04/03/2018), Disp: 1 vial, Rfl: 0 .  metoprolol tartrate (LOPRESSOR) 50 MG tablet, Take  1 tablet (50 mg total) by mouth 2 (two) times daily. (Patient not taking: Reported on 04/03/2018), Disp: 180 tablet, Rfl: 3 .  nitroGLYCERIN (NITROSTAT) 0.4 MG SL tablet, Place 1 tablet (0.4 mg total) under the tongue every 5 (five) minutes as needed for chest pain. (Patient not taking: Reported on 04/03/2018), Disp: 25 tablet, Rfl: 3 .  promethazine (PHENERGAN) 25 MG suppository, Place 1 suppository (25 mg total) rectally every 6 (six) hours as needed for nausea. (Patient not taking: Reported on 04/03/2018), Disp: 25 suppository, Rfl: 1  Review of Systems  Per HPI unless specifically indicated above     Objective:    BP 95/72 (BP Location: Left Arm, Patient Position: Sitting, Cuff Size: Normal)   Pulse (!) 121   Temp 97.7 F (36.5 C)   Ht 5\' 8"  (1.727 m)   Wt 154 lb (69.9 kg)   SpO2 99%   BMI  23.42 kg/m   Wt Readings from Last 3 Encounters:  04/03/18 154 lb (69.9 kg)  03/22/18 164 lb (74.4 kg)  03/16/18 166 lb 10.7 oz (75.6 kg)    Orthostatic VS reviewed  Physical Exam  Constitutional: He is oriented to person, place, and time. He appears well-developed and well-nourished. He has a sickly appearance.  HENT:  Head: Normocephalic and atraumatic.  Right Ear: Tympanic membrane normal.  Left Ear: Tympanic membrane normal.  Mouth/Throat: Uvula is midline.  Neck: Neck supple.  Cardiovascular: Regular rhythm. Tachycardia present.  Pulmonary/Chest: Effort normal and breath sounds normal. No respiratory distress. He has no wheezes.  Abdominal: Soft. Bowel sounds are normal. He exhibits no distension and no fluid wave. There is no hepatosplenomegaly. There is no tenderness. There is no rigidity, no rebound and no guarding.  Musculoskeletal: He exhibits no edema.  Lymphadenopathy:    He has no cervical adenopathy.  Neurological: He is alert and oriented to person, place, and time.  Pt visibly sways when he stands up.  Skin: Skin is warm and dry.  Psychiatric: He has a normal mood and affect. His behavior is normal.  Vitals reviewed.   Results for orders placed or performed during the hospital encounter of 03/30/18  Hemoglobin A1c  Result Value Ref Range   Hgb A1c MFr Bld 12.6 (H) 4.8 - 5.6 %   Mean Plasma Glucose 314.92 mg/dL  Comprehensive metabolic panel  Result Value Ref Range   Sodium 136 135 - 145 mmol/L   Potassium 3.8 3.5 - 5.1 mmol/L   Chloride 95 (L) 98 - 111 mmol/L   CO2 27 22 - 32 mmol/L   Glucose, Bld 69 (L) 70 - 99 mg/dL   BUN 22 (H) 6 - 20 mg/dL   Creatinine, Ser 1.61 0.61 - 1.24 mg/dL   Calcium 8.7 (L) 8.9 - 10.3 mg/dL   Total Protein 7.1 6.5 - 8.1 g/dL   Albumin 3.5 3.5 - 5.0 g/dL   AST 16 15 - 41 U/L   ALT 14 0 - 44 U/L   Alkaline Phosphatase 83 38 - 126 U/L   Total Bilirubin 0.9 0.3 - 1.2 mg/dL   GFR calc non Af Amer >60 >60 mL/min   GFR calc Af  Amer >60 >60 mL/min   Anion gap 14 5 - 15  Lipid panel  Result Value Ref Range   Cholesterol 92 0 - 200 mg/dL   Triglycerides 096 <045 mg/dL   HDL 30 (L) >40 mg/dL   Total CHOL/HDL Ratio 3.1 RATIO   VLDL 25 0 - 40 mg/dL  LDL Cholesterol 37 0 - 99 mg/dL   Random bs in office today 162     Assessment & Plan:   Encounter Diagnoses  Name Primary?  . Dehydration Yes  . Nausea   . Uncontrolled type 2 diabetes mellitus with hyperglycemia (HCC)   . Coronary artery disease involving native coronary artery of native heart with angina pectoris (HCC)   . Hyperlipidemia, unspecified hyperlipidemia type     -recommended pt go to ER for treatment of dehydration but pt declined.  He was given phenergan IM in office and told to push fluids. (his father drove him here to his appointment today).   He is to monitor bs- pt was given sample novolog and a bs log sheet.  Gave rx phenergan tabs which should only cost about $6.   -reviewed labs with pt -Pt to f/u 2 days for recheck.  Urged him to Go to ER if worsens

## 2018-04-03 NOTE — Patient Instructions (Signed)
Dehydration, Adult Dehydration is a condition in which there is not enough fluid or water in the body. This happens when you lose more fluids than you take in. Important organs, such as the kidneys, brain, and heart, cannot function without a proper amount of fluids. Any loss of fluids from the body can lead to dehydration. Dehydration can range from mild to severe. This condition should be treated right away to prevent it from becoming severe. What are the causes? This condition may be caused by:  Vomiting.  Diarrhea.  Excessive sweating, such as from heat exposure or exercise.  Not drinking enough fluid, especially: ? When ill. ? While doing activity that requires a lot of energy.  Excessive urination.  Fever.  Infection.  Certain medicines, such as medicines that cause the body to lose excess fluid (diuretics).  Inability to access safe drinking water.  Reduced physical ability to get adequate water and food.  What increases the risk? This condition is more likely to develop in people:  Who have a poorly controlled long-term (chronic) illness, such as diabetes, heart disease, or kidney disease.  Who are age 65 or older.  Who are disabled.  Who live in a place with high altitude.  Who play endurance sports.  What are the signs or symptoms? Symptoms of mild dehydration may include:  Thirst.  Dry lips.  Slightly dry mouth.  Dry, warm skin.  Dizziness. Symptoms of moderate dehydration may include:  Very dry mouth.  Muscle cramps.  Dark urine. Urine may be the color of tea.  Decreased urine production.  Decreased tear production.  Heartbeat that is irregular or faster than normal (palpitations).  Headache.  Light-headedness, especially when you stand up from a sitting position.  Fainting (syncope). Symptoms of severe dehydration may include:  Changes in skin, such as: ? Cold and clammy skin. ? Blotchy (mottled) or pale skin. ? Skin that does  not quickly return to normal after being lightly pinched and released (poor skin turgor).  Changes in body fluids, such as: ? Extreme thirst. ? No tear production. ? Inability to sweat when body temperature is high, such as in hot weather. ? Very little urine production.  Changes in vital signs, such as: ? Weak pulse. ? Pulse that is more than 100 beats a minute when sitting still. ? Rapid breathing. ? Low blood pressure.  Other changes, such as: ? Sunken eyes. ? Cold hands and feet. ? Confusion. ? Lack of energy (lethargy). ? Difficulty waking up from sleep. ? Short-term weight loss. ? Unconsciousness. How is this diagnosed? This condition is diagnosed based on your symptoms and a physical exam. Blood and urine tests may be done to help confirm the diagnosis. How is this treated? Treatment for this condition depends on the severity. Mild or moderate dehydration can often be treated at home. Treatment should be started right away. Do not wait until dehydration becomes severe. Severe dehydration is an emergency and it needs to be treated in a hospital. Treatment for mild dehydration may include:  Drinking more fluids.  Replacing salts and minerals in your blood (electrolytes) that you may have lost. Treatment for moderate dehydration may include:  Drinking an oral rehydration solution (ORS). This is a drink that helps you replace fluids and electrolytes (rehydrate). It can be found at pharmacies and retail stores. Treatment for severe dehydration may include:  Receiving fluids through an IV tube.  Receiving an electrolyte solution through a feeding tube that is passed through your nose   and into your stomach (nasogastric tube, or NG tube).  Correcting any abnormalities in electrolytes.  Treating the underlying cause of dehydration. Follow these instructions at home:  If directed by your health care provider, drink an ORS: ? Make an ORS by following instructions on the  package. ? Start by drinking small amounts, about  cup (120 mL) every 5-10 minutes. ? Slowly increase how much you drink until you have taken the amount recommended by your health care provider.  Drink enough clear fluid to keep your urine clear or pale yellow. If you were told to drink an ORS, finish the ORS first, then start slowly drinking other clear fluids. Drink fluids such as: ? Water. Do not drink only water. Doing that can lead to having too little salt (sodium) in the body (hyponatremia). ? Ice chips. ? Fruit juice that you have added water to (diluted fruit juice). ? Low-calorie sports drinks.  Avoid: ? Alcohol. ? Drinks that contain a lot of sugar. These include high-calorie sports drinks, fruit juice that is not diluted, and soda. ? Caffeine. ? Foods that are greasy or contain a lot of fat or sugar.  Take over-the-counter and prescription medicines only as told by your health care provider.  Do not take sodium tablets. This can lead to having too much sodium in the body (hypernatremia).  Eat foods that contain a healthy balance of electrolytes, such as bananas, oranges, potatoes, tomatoes, and spinach.  Keep all follow-up visits as told by your health care provider. This is important. Contact a health care provider if:  You have abdominal pain that: ? Gets worse. ? Stays in one area (localizes).  You have a rash.  You have a stiff neck.  You are more irritable than usual.  You are sleepier or more difficult to wake up than usual.  You feel weak or dizzy.  You feel very thirsty.  You have urinated only a small amount of very dark urine over 6-8 hours. Get help right away if:  You have symptoms of severe dehydration.  You cannot drink fluids without vomiting.  Your symptoms get worse with treatment.  You have a fever.  You have a severe headache.  You have vomiting or diarrhea that: ? Gets worse. ? Does not go away.  You have blood or green matter  (bile) in your vomit.  You have blood in your stool. This may cause stool to look black and tarry.  You have not urinated in 6-8 hours.  You faint.  Your heart rate while sitting still is over 100 beats a minute.  You have trouble breathing. This information is not intended to replace advice given to you by your health care provider. Make sure you discuss any questions you have with your health care provider. Document Released: 05/17/2005 Document Revised: 12/12/2015 Document Reviewed: 07/11/2015 Elsevier Interactive Patient Education  2018 [employer]  

## 2018-04-05 ENCOUNTER — Ambulatory Visit: Payer: Medicaid Other | Admitting: Physician Assistant

## 2018-04-05 ENCOUNTER — Encounter: Payer: Self-pay | Admitting: Physician Assistant

## 2018-04-05 VITALS — BP 96/66 | HR 81 | Temp 97.5°F | Ht 68.0 in | Wt 158.5 lb

## 2018-04-05 DIAGNOSIS — I25119 Atherosclerotic heart disease of native coronary artery with unspecified angina pectoris: Secondary | ICD-10-CM

## 2018-04-05 DIAGNOSIS — E860 Dehydration: Secondary | ICD-10-CM

## 2018-04-05 DIAGNOSIS — K219 Gastro-esophageal reflux disease without esophagitis: Secondary | ICD-10-CM

## 2018-04-05 DIAGNOSIS — R300 Dysuria: Secondary | ICD-10-CM

## 2018-04-05 DIAGNOSIS — E1165 Type 2 diabetes mellitus with hyperglycemia: Secondary | ICD-10-CM

## 2018-04-05 DIAGNOSIS — F17200 Nicotine dependence, unspecified, uncomplicated: Secondary | ICD-10-CM

## 2018-04-05 DIAGNOSIS — E785 Hyperlipidemia, unspecified: Secondary | ICD-10-CM

## 2018-04-05 LAB — POCT URINALYSIS DIPSTICK
Bilirubin, UA: NEGATIVE
GLUCOSE UA: NEGATIVE
Ketones, UA: NEGATIVE
LEUKOCYTES UA: NEGATIVE
Nitrite, UA: NEGATIVE
Protein, UA: POSITIVE — AB
Spec Grav, UA: 1.015 (ref 1.010–1.025)
Urobilinogen, UA: 0.2 E.U./dL
pH, UA: 5.5 (ref 5.0–8.0)

## 2018-04-05 NOTE — Progress Notes (Signed)
BP 96/66 (BP Location: Right Arm, Patient Position: Sitting, Cuff Size: Normal)   Pulse 81   Temp (!) 97.5 F (36.4 C)   Ht 5\' 8"  (1.727 m)   Wt 158 lb 8 oz (71.9 kg)   SpO2 100%   BMI 24.10 kg/m    Subjective:    Patient ID: [name] [name], male    DOB: Apr 03, 1971, 47 y.o.   MRN: [id number]  HPI: [name] [name] is a 47 y.o. male presenting on 04/05/2018 for Dehydration and Diabetes   HPI   He is feeling better than he did monday Pt is drinking fluids - water.    He ate soup last night.  He hasn't eaten yet. No nausea today.  fbs 145 this morning.  He is using lantus 25 u.  He is starting the novolog today  Relevant past medical, surgical, family and social history reviewed and updated as indicated. Interim medical history since our last visit reviewed. Allergies and medications reviewed and updated.   Current Outpatient Medications:  .  acetaminophen (TYLENOL) 325 MG tablet, Take 2 tablets (650 mg total) by mouth every 6 (six) hours as needed for moderate pain., Disp: , Rfl:  .  aspirin 81 MG tablet, Take 81 mg by mouth daily., Disp: , Rfl:  .  atorvastatin (LIPITOR) 80 MG tablet, Take 1 tablet (80 mg total) by mouth daily., Disp: 90 tablet, Rfl: 3 .  famotidine (PEPCID) 20 MG tablet, Take 1 tablet (20 mg total) by mouth 2 (two) times daily., Disp: 60 tablet, Rfl: 6 .  hydrOXYzine (ATARAX/VISTARIL) 25 MG tablet, Take 25 mg by mouth 3 (three) times daily as needed for anxiety. , Disp: , Rfl:  .  insulin aspart (NOVOLOG) 100 UNIT/ML injection, Before each meal 3 times a day, 140-199 - 2 units, 200-250 - 4 units, 251-299 - 6 units,  300-349 - 8 units,  350 or above 10 units. Dispense syringes and needles as needed, Ok to switch to PEN if approved. Substitute to any brand approved. DX DM2, Code E11.65, Disp: 1 vial, Rfl: 0 .  Insulin Glargine (LANTUS SOLOSTAR) 100 UNIT/ML Solostar Pen, Inject 10 Units into the skin at bedtime. (Patient taking differently: Inject 25 Units into the  skin at bedtime. ), Disp: 1 pen, Rfl: 0 .  isosorbide mononitrate (IMDUR) 30 MG 24 hr tablet, Take 0.5 tablets (15 mg total) by mouth daily., Disp: 45 tablet, Rfl: 3 .  metFORMIN (GLUCOPHAGE) 1000 MG tablet, TAKE 1 Tablet  BY MOUTH TWICE DAILY WITH MEALS, Disp: 180 tablet, Rfl: 0 .  metoCLOPramide (REGLAN) 5 MG tablet, Take 1 tablet (5 mg total) by mouth 3 (three) times daily before meals., Disp: 90 tablet, Rfl: 0 .  metoprolol tartrate (LOPRESSOR) 50 MG tablet, Take 1 tablet (50 mg total) by mouth 2 (two) times daily., Disp: 180 tablet, Rfl: 3 .  nitroGLYCERIN (NITROSTAT) 0.4 MG SL tablet, Place 1 tablet (0.4 mg total) under the tongue every 5 (five) minutes as needed for chest pain., Disp: 25 tablet, Rfl: 3 .  promethazine (PHENERGAN) 25 MG suppository, Place 1 suppository (25 mg total) rectally every 6 (six) hours as needed for nausea., Disp: 25 suppository, Rfl: 1 .  promethazine (PHENERGAN) 25 MG tablet, Take 1 tablet (25 mg total) by mouth every 8 (eight) hours as needed for nausea or vomiting., Disp: 10 tablet, Rfl: 0 .  sertraline (ZOLOFT) 100 MG tablet, Take 200 mg by mouth daily., Disp: , Rfl:    Review of  Systems  Constitutional: Positive for appetite change, chills, diaphoresis and unexpected weight change. Negative for fatigue and fever.  HENT: Positive for drooling, ear pain and trouble swallowing. Negative for congestion, facial swelling, hearing loss, mouth sores, sneezing, sore throat and voice change.   Eyes: Positive for pain, discharge, redness, itching and visual disturbance.  Respiratory: Positive for choking and shortness of breath. Negative for cough and wheezing.   Cardiovascular: Positive for chest pain and leg swelling. Negative for palpitations.  Gastrointestinal: Positive for abdominal pain and vomiting. Negative for blood in stool, constipation and diarrhea.  Endocrine: Positive for polydipsia. Negative for cold intolerance and heat intolerance.  Genitourinary:  Positive for decreased urine volume and dysuria. Negative for hematuria.  Musculoskeletal: Positive for arthralgias, back pain and gait problem.  Skin: Negative for rash.  Allergic/Immunologic: Negative for environmental allergies.  Neurological: Positive for light-headedness and headaches. Negative for seizures and syncope.  Hematological: Negative for adenopathy.  Psychiatric/Behavioral: Positive for dysphoric mood. Negative for agitation and suicidal ideas. The patient is nervous/anxious.     Per HPI unless specifically indicated above     Objective:    BP 96/66 (BP Location: Right Arm, Patient Position: Sitting, Cuff Size: Normal)   Pulse 81   Temp (!) 97.5 F (36.4 C)   Ht 5\' 8"  (1.727 m)   Wt 158 lb 8 oz (71.9 kg)   SpO2 100%   BMI 24.10 kg/m   Wt Readings from Last 3 Encounters:  04/05/18 158 lb 8 oz (71.9 kg)  04/03/18 154 lb (69.9 kg)  03/22/18 164 lb (74.4 kg)    Physical Exam  Constitutional: He is oriented to person, place, and time. He appears well-developed and well-nourished.  HENT:  Head: Normocephalic and atraumatic.  Neck: Neck supple.  Cardiovascular: Normal rate and regular rhythm.  Pulmonary/Chest: Effort normal and breath sounds normal. He has no wheezes.  Abdominal: Soft. Bowel sounds are normal. There is no hepatosplenomegaly. There is no tenderness.  Musculoskeletal: He exhibits no edema.  Lymphadenopathy:    He has no cervical adenopathy.  Neurological: He is alert and oriented to person, place, and time.  Skin: Skin is warm and dry.  Psychiatric: He has a normal mood and affect. His behavior is normal.  Vitals reviewed.   Results for orders placed or performed in visit on 04/03/18  POCT Glucose (CBG)  Result Value Ref Range   POC Glucose 162 (A) 70 - 99 mg/dl      Assessment & Plan:   Encounter Diagnoses  Name Primary?  Marland Kitchen Uncontrolled type 2 diabetes mellitus with hyperglycemia (HCC) Yes  . Dehydration   . Coronary artery disease  involving native coronary artery of native heart with angina pectoris (HCC)   . Hyperlipidemia, unspecified hyperlipidemia type   . Tobacco use disorder   . Dysuria   . Gastroesophageal reflux disease, esophagitis presence not specified      -reviewed labs with pt -counseled pt to Push fluids and gradually advance diet as tolerated -no medication changes today -Counseled pt on monitoring bs and need to bring log to follow up appt in 4 week.  Pt to RTO sooner prn

## 2018-04-17 ENCOUNTER — Ambulatory Visit: Payer: Self-pay | Admitting: Cardiology

## 2018-04-20 ENCOUNTER — Other Ambulatory Visit: Payer: Self-pay | Admitting: Physician Assistant

## 2018-05-01 ENCOUNTER — Ambulatory Visit: Payer: Medicaid Other | Admitting: Physician Assistant

## 2018-05-01 ENCOUNTER — Encounter: Payer: Self-pay | Admitting: Physician Assistant

## 2018-05-01 VITALS — BP 151/92 | HR 83 | Temp 97.9°F | Wt 175.0 lb

## 2018-05-01 DIAGNOSIS — Z91199 Patient's noncompliance with other medical treatment and regimen due to unspecified reason: Secondary | ICD-10-CM

## 2018-05-01 DIAGNOSIS — E1165 Type 2 diabetes mellitus with hyperglycemia: Secondary | ICD-10-CM

## 2018-05-01 DIAGNOSIS — E785 Hyperlipidemia, unspecified: Secondary | ICD-10-CM

## 2018-05-01 DIAGNOSIS — E1140 Type 2 diabetes mellitus with diabetic neuropathy, unspecified: Secondary | ICD-10-CM

## 2018-05-01 DIAGNOSIS — Z9119 Patient's noncompliance with other medical treatment and regimen: Secondary | ICD-10-CM

## 2018-05-01 DIAGNOSIS — I25119 Atherosclerotic heart disease of native coronary artery with unspecified angina pectoris: Secondary | ICD-10-CM

## 2018-05-01 DIAGNOSIS — I10 Essential (primary) hypertension: Secondary | ICD-10-CM

## 2018-05-01 LAB — GLUCOSE, POCT (MANUAL RESULT ENTRY): POC Glucose: 189 mg/dl — AB (ref 70–99)

## 2018-05-01 MED ORDER — GABAPENTIN 300 MG PO CAPS: 300.0000 mg | ORAL_CAPSULE | Freq: Two times a day (BID) | ORAL | 0 refills | Status: DC

## 2018-05-01 NOTE — Patient Instructions (Addendum)
[facility] [address] [place], [place] [address] [phone] Tuesday, Jan. 7, 2019 10:40 am ------------------------ [employer] [phone]

## 2018-05-01 NOTE — Progress Notes (Signed)
BP (!) 151/92 (BP Location: Right Arm, Patient Position: Sitting, Cuff Size: Normal)   Pulse 83   Temp 97.9 F (36.6 C)   Wt 175 lb (79.4 kg)   SpO2 100%   BMI 26.61 kg/m    Subjective:    Patient ID: [name] [name], male    DOB: 05/17/1971, 47 y.o.   MRN: [id number]  HPI: [name] PettyDanny L Schueler is a 47 y.o. male presenting on 05/01/2018 for Diabetes   HPI  Pt appt today is to review bs log and adjust insulin.  Pt has not been checking his bs.    He is using 25u lantus and 2 u novolog.   [facility]  Pt says he has sent in 2 [employer] [facility] applications but hasn't heard yet on whether he was approved.   Pt complains of his feet burning  Relevant past medical, surgical, family and social history reviewed and updated as indicated. Interim medical history since our last visit reviewed. Allergies and medications reviewed and updated.    Current Outpatient Medications:  .  acetaminophen (TYLENOL) 325 MG tablet, Take 2 tablets (650 mg total) by mouth every 6 (six) hours as needed for moderate pain., Disp: , Rfl:  .  aspirin 81 MG tablet, Take 81 mg by mouth daily., Disp: , Rfl:  .  atorvastatin (LIPITOR) 80 MG tablet, Take 1 tablet (80 mg total) by mouth daily., Disp: 90 tablet, Rfl: 3 .  insulin aspart (NOVOLOG) 100 UNIT/ML injection, Before each meal 3 times a day, 140-199 - 2 units, 200-250 - 4 units, 251-299 - 6 units,  300-349 - 8 units,  350 or above 10 units. Dispense syringes and needles as needed, Ok to switch to PEN if approved. Substitute to any brand approved. DX DM2, Code E11.65, Disp: 1 vial, Rfl: 0 .  Insulin Glargine (LANTUS SOLOSTAR) 100 UNIT/ML Solostar Pen, Inject 10 Units into the skin at bedtime. (Patient taking differently: Inject 25 Units into the skin at bedtime. ), Disp: 1 pen, Rfl: 0 .  isosorbide mononitrate (IMDUR) 30 MG 24 hr tablet, Take 0.5 tablets (15 mg total) by mouth daily., Disp: 45 tablet, Rfl: 3 .  metFORMIN (GLUCOPHAGE) 1000 MG  tablet, TAKE 1 Tablet  BY MOUTH TWICE DAILY WITH MEALS, Disp: 180 tablet, Rfl: 0 .  metoCLOPramide (REGLAN) 5 MG tablet, Take 1 tablet (5 mg total) by mouth 3 (three) times daily before meals., Disp: 90 tablet, Rfl: 0 .  metoprolol tartrate (LOPRESSOR) 50 MG tablet, Take 1 tablet (50 mg total) by mouth 2 (two) times daily., Disp: 180 tablet, Rfl: 3 .  nitroGLYCERIN (NITROSTAT) 0.4 MG SL tablet, Place 1 tablet (0.4 mg total) under the tongue every 5 (five) minutes as needed for chest pain., Disp: 25 tablet, Rfl: 3 .  promethazine (PHENERGAN) 25 MG suppository, Place 1 suppository (25 mg total) rectally every 6 (six) hours as needed for nausea., Disp: 25 suppository, Rfl: 1 .  promethazine (PHENERGAN) 25 MG tablet, Take 1 tablet (25 mg total) by mouth every 8 (eight) hours as needed for nausea or vomiting., Disp: 10 tablet, Rfl: 0 .  sertraline (ZOLOFT) 100 MG tablet, Take 200 mg by mouth daily., Disp: , Rfl:  .  famotidine (PEPCID) 20 MG tablet, Take 1 tablet (20 mg total) by mouth 2 (two) times daily. (Patient not taking: Reported on 05/01/2018), Disp: 60 tablet, Rfl: 6 .  hydrOXYzine (ATARAX/VISTARIL) 25 MG tablet, Take 25 mg by mouth 3 (three)  times daily as needed for anxiety. , Disp: , Rfl:    Review of Systems  Constitutional: Positive for appetite change, chills, diaphoresis, fatigue and unexpected weight change. Negative for fever.  HENT: Positive for drooling and ear pain. Negative for congestion, dental problem, facial swelling, hearing loss, mouth sores, sneezing, sore throat, trouble swallowing and voice change.   Eyes: Positive for pain, discharge, redness, itching and visual disturbance.  Respiratory: Positive for shortness of breath. Negative for cough, choking and wheezing.   Cardiovascular: Positive for leg swelling. Negative for chest pain and palpitations.  Gastrointestinal: Positive for abdominal pain, constipation and diarrhea. Negative for blood in stool and vomiting.   Endocrine: Positive for polydipsia. Negative for cold intolerance and heat intolerance.  Genitourinary: Positive for decreased urine volume. Negative for dysuria and hematuria.  Musculoskeletal: Positive for arthralgias, back pain and gait problem.  Skin: Negative for rash.  Allergic/Immunologic: Positive for environmental allergies.  Neurological: Positive for light-headedness and headaches. Negative for seizures and syncope.  Hematological: Negative for adenopathy.  Psychiatric/Behavioral: Positive for agitation and dysphoric mood. Negative for suicidal ideas. The patient is nervous/anxious.     Per HPI unless specifically indicated above     Objective:    BP (!) 151/92 (BP Location: Right Arm, Patient Position: Sitting, Cuff Size: Normal)   Pulse 83   Temp 97.9 F (36.6 C)   Wt 175 lb (79.4 kg)   SpO2 100%   BMI 26.61 kg/m   Wt Readings from Last 3 Encounters:  05/01/18 175 lb (79.4 kg)  04/05/18 158 lb 8 oz (71.9 kg)  04/03/18 154 lb (69.9 kg)    Physical Exam  Constitutional: He is oriented to person, place, and time. He appears well-developed and well-nourished.  HENT:  Head: Normocephalic and atraumatic.  Neck: Neck supple.  Cardiovascular: Normal rate and regular rhythm.  Pulmonary/Chest: Effort normal and breath sounds normal. He has no wheezes.  Abdominal: Soft. Bowel sounds are normal. There is no hepatosplenomegaly. There is no tenderness.  Musculoskeletal: He exhibits no edema.  Lymphadenopathy:    He has no cervical adenopathy.  Neurological: He is alert and oriented to person, place, and time.  Skin: Skin is warm and dry.  Psychiatric: He has a normal mood and affect. His behavior is normal.  Vitals reviewed.       Assessment & Plan:   Encounter Diagnoses  Name Primary?  Marland Kitchen Uncontrolled type 2 diabetes mellitus with hyperglycemia (HCC) Yes  . Personal history of noncompliance with medical treatment, presenting hazards to health   . Type 2 diabetes  mellitus with diabetic neuropathy, unspecified whether long term insulin use (HCC)   . Coronary artery disease involving native coronary artery of native heart with angina pectoris (HCC)   . Hyperlipidemia, unspecified hyperlipidemia type   . Essential hypertension      -Pt counseled on need to monitor bs so that his insulin can be safely adjusted.  Pt states understanding -counseled pt to [facility] -encouraged pt to Check on [facility] application with the financial counselor at the hospital.   -pt is to go to Diabetic eye exam as scheduled on Jan 7 -will Add gabapentin for neuropathy.  Discussed with pt that the best thing to help neuropathy is to get DM under control. -pt counseled to monitor bs and call office for fbs < 70 or > 300 -pt to go to cardiology appointment as scheduled -follow up OV 2 months.  RTO sooner prn

## 2018-05-15 ENCOUNTER — Other Ambulatory Visit: Payer: Self-pay | Admitting: Physician Assistant

## 2018-05-15 MED ORDER — GABAPENTIN 300 MG PO CAPS: 300.0000 mg | ORAL_CAPSULE | Freq: Two times a day (BID) | ORAL | 2 refills | Status: DC

## 2018-06-14 ENCOUNTER — Ambulatory Visit (INDEPENDENT_AMBULATORY_CARE_PROVIDER_SITE_OTHER): Payer: Self-pay | Admitting: Cardiology

## 2018-06-14 ENCOUNTER — Encounter: Payer: Self-pay | Admitting: Cardiology

## 2018-06-14 VITALS — BP 108/72 | HR 90 | Ht 68.0 in | Wt 168.0 lb

## 2018-06-14 DIAGNOSIS — I25119 Atherosclerotic heart disease of native coronary artery with unspecified angina pectoris: Secondary | ICD-10-CM

## 2018-06-14 DIAGNOSIS — Z720 Tobacco use: Secondary | ICD-10-CM

## 2018-06-14 DIAGNOSIS — E782 Mixed hyperlipidemia: Secondary | ICD-10-CM

## 2018-06-14 DIAGNOSIS — E1165 Type 2 diabetes mellitus with hyperglycemia: Secondary | ICD-10-CM

## 2018-06-14 MED ORDER — ISOSORBIDE MONONITRATE ER 30 MG PO TB24: 30.0000 mg | ORAL_TABLET | Freq: Every day | ORAL | 3 refills | Status: DC

## 2018-06-14 NOTE — Progress Notes (Signed)
Cardiology Office Note  Date: 06/14/2018   ID: [name] PettyDanny L Sgroi, DOB 1970-12-14, MRN [id number]  PCP: Jacquelin HawkingMcElroy, Shannon, PA-C  Primary Cardiologist: Nona DellSamuel Sybella Harnish, MD   Chief Complaint  Patient presents with  . Coronary Artery Disease    History of Present Illness: [name] [name] is a 48 y.o. male last seen by Ms. Lilla ShookLenze PA-C in September 2019.  He presents for a routine follow-up visit.  He reports occasional angina symptoms, does not have to use sublingual nitroglycerin very frequently.  I reviewed his medications which are outlined below.  We discussed considering an increase in his Imdur to 30 mg daily if tolerated.  Cardiac regimen otherwise includes aspirin, Lipitor, and Lopressor.  Last LDL was 37.  He continues to follow with Ms. McElroy PA-C for management of diabetes mellitus.  Hemoglobin A1c was 12.6 in October 2019.  Past Medical History:  Diagnosis Date  . Anxiety   . Arthritis   . CAD (coronary artery disease)    Moderate LAD disease 2016 - Dr. Jacinto HalimGanji  . Chronic bronchitis (HCC)   . Chronic upper back pain   . COPD (chronic obstructive pulmonary disease) (HCC)   . Depression   . Diabetic peripheral neuropathy (HCC)   . GERD (gastroesophageal reflux disease)   . History of gout   . Hyperlipemia   . Hypertension   . Migraine   . 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 Sleep apnea 2016  . Type 2 diabetes mellitus (HCC)     Past Surgical History:  Procedure Laterality Date  . ANKLE SURGERY Right 1982   "had extra bones in there; took them out"  . APPENDECTOMY  1975  . CARDIAC CATHETERIZATION N/A 03/28/2015   Procedure: Left Heart Cath and Coronary Angiography;  Surgeon: Yates DecampJay Ganji, MD;  [facility];  Service: Cardiovascular;  Laterality: N/A;  . CARDIAC CATHETERIZATION N/A 03/28/2015   Procedure: Intravascular Pressure Wire/FFR Study;  Surgeon: Yates DecampJay Ganji, MD;  [facility];  Service: Cardiovascular;  Laterality: N/A;  . CARPAL  TUNNEL RELEASE Left ~ 2008  . COLONOSCOPY WITH ESOPHAGOGASTRODUODENOSCOPY (EGD)    . ELBOW FRACTURE SURGERY Left ~ 2008  . FRACTURE SURGERY    . PILONIDAL CYST EXCISION N/A 03/24/2017   Procedure: EXCISION CHRONIC  PILONIDAL ABSCESS;  Surgeon: [name] MiyamotoBlackman, Douglas, MD;  Location: [facility] ORS;  Service: General;  Laterality: N/A;  . TENDON REPAIR Left ~ 2004   "main tendon in my ankle"    Current Outpatient Medications  Medication Sig Dispense Refill  . acetaminophen (TYLENOL) 325 MG tablet Take 2 tablets (650 mg total) by mouth every 6 (six) hours as needed for moderate pain.    Marland Kitchen. aspirin 81 MG tablet Take 81 mg by mouth daily.    Marland Kitchen. atorvastatin (LIPITOR) 80 MG tablet Take 1 tablet (80 mg total) by mouth daily. 90 tablet 3  . famotidine (PEPCID) 20 MG tablet Take 1 tablet (20 mg total) by mouth 2 (two) times daily. 60 tablet 6  . gabapentin (NEURONTIN) 300 MG capsule Take 1 capsule (300 mg total) by mouth 2 (two) times daily. 60 capsule 2  . hydrOXYzine (ATARAX/VISTARIL) 25 MG tablet Take 25 mg by mouth 3 (three) times daily as needed for anxiety.     . insulin aspart (NOVOLOG) 100 UNIT/ML injection Before each meal 3 times a day, 140-199 - 2 units, 200-250 - 4 units, 251-299 - 6 units,  300-349 - 8 units,  350 or above  10 units. Dispense syringes and needles as needed, Ok to switch to PEN if approved. Substitute to any brand approved. DX DM2, Code E11.65 1 vial 0  . Insulin Glargine (LANTUS SOLOSTAR) 100 UNIT/ML Solostar Pen Inject 10 Units into the skin at bedtime. (Patient taking differently: Inject 25 Units into the skin at bedtime. ) 1 pen 0  . isosorbide mononitrate (IMDUR) 30 MG 24 hr tablet Take 1 tablet (30 mg total) by mouth daily. 90 tablet 3  . metFORMIN (GLUCOPHAGE) 1000 MG tablet TAKE 1 Tablet  BY MOUTH TWICE DAILY WITH MEALS 180 tablet 0  . metoCLOPramide (REGLAN) 5 MG tablet Take 1 tablet (5 mg total) by mouth 3 (three) times daily before meals. 90 tablet 0  . metoprolol tartrate  (LOPRESSOR) 50 MG tablet Take 1 tablet (50 mg total) by mouth 2 (two) times daily. 180 tablet 3  . nitroGLYCERIN (NITROSTAT) 0.4 MG SL tablet Place 1 tablet (0.4 mg total) under the tongue every 5 (five) minutes as needed for chest pain. 25 tablet 3  . promethazine (PHENERGAN) 25 MG suppository Place 1 suppository (25 mg total) rectally every 6 (six) hours as needed for nausea. 25 suppository 1  . promethazine (PHENERGAN) 25 MG tablet Take 1 tablet (25 mg total) by mouth every 8 (eight) hours as needed for nausea or vomiting. 10 tablet 0  . sertraline (ZOLOFT) 100 MG tablet Take 200 mg by mouth daily.     No current facility-administered medications for this visit.    Allergies:  Omeprazole magnesium and Esomeprazole   Social History: The patient  reports that he has been smoking cigarettes. He has a 17.00 pack-year smoking history. He has never used smokeless tobacco. He reports that he does not drink alcohol or use drugs.   ROS:  Please see the history of present illness. Otherwise, complete review of systems is positive for chronic pain.  All other systems are reviewed and negative.   Physical Exam: VS:  BP 108/72 (BP Location: Left Arm)   Pulse 90   Ht 5\' 8"  (1.727 m)   Wt 168 lb (76.2 kg)   SpO2 93%   BMI 25.54 kg/m , BMI Body mass index is 25.54 kg/m.  Wt Readings from Last 3 Encounters:  06/14/18 168 lb (76.2 kg)  05/01/18 175 lb (79.4 kg)  04/05/18 158 lb 8 oz (71.9 kg)    General: Chronically ill-appearing male, no distress. HEENT: Conjunctiva and lids normal, oropharynx clear with poor dentition. Neck: Supple, no elevated JVP or carotid bruits, no thyromegaly. Lungs: Clear to auscultation, nonlabored breathing at rest. Cardiac: Regular rate and rhythm, no S3 or significant systolic murmur. Abdomen: Soft, nontender, bowel sounds present. Extremities: No pitting edema, distal pulses 1-2+. Skin: Warm and dry. Musculoskeletal: No kyphosis. Neuropsychiatric: Alert and  oriented x3, affect grossly appropriate.  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  Recent Labwork: 03/19/2018: Magnesium 2.0 03/24/2018: Hemoglobin 13.5; Platelets 253 03/30/2018: ALT 14; AST 16; BUN 22; Creatinine, Ser 1.19; Potassium 3.8; Sodium 136     Component Value Date/Time   CHOL 92 03/30/2018 1130   TRIG 127 03/30/2018 1130   HDL 30 (L) 03/30/2018 1130   CHOLHDL 3.1 03/30/2018 1130   VLDL 25 03/30/2018 1130   LDLCALC 37 03/30/2018 1130    Other Studies Reviewed Today:  Lexiscan Myoview 01/16/2018:  There was no ST segment deviation noted during stress.  Defect 1: There  is a medium defect of mild severity present in the mid anteroseptal, mid inferoseptal, mid inferior, apical septal and apical inferior location. This is likely due to soft tissue attenuation.  This is a low risk study. No ischemic zones.  Nuclear stress EF: 63%.  Cardiac catheterization 03/28/2015: 1. Normal LV systolic function, EF 55-60%. 2. Moderate coronary artery disease, proximal to midsegment of the LAD showing a diffuse 50% stenosis, there is rapid tapering of the LAD from the proximal segment to the midsegment and followed by a hazy 50% stenosis. Mid to distal LAD also had a 50% stenosis. FFR to this region was negative for hemodynamic significance. There is minimal disease in the circumflex and RCA. Codominant system.  Assessment and Plan:  1.  CAD with angina.  He has a history of moderate LAD disease with negative FFR assessment in 2016.  We are continuing medical therapy with low risk follow-up Myoview done in August 2019.  Increase Imdur to 30 mg daily otherwise continue with present regimen.  2.  Mixed hyperlipidemia, on high-dose Lipitor.  LDL has come down from 188-37.  3.  Uncontrolled type 2 diabetes mellitus, continues to follow with PCP.  4.  Tobacco abuse.  We have discussed  smoking cessation.  He has not been able to quit so far.  Current medicines were reviewed with the patient today.  Disposition: Follow-up in 6 months.  Signed, Jonelle Sidle, MD, [facility] 06/14/2018 1:10 PM    [facility] [address] [place], [place] [address] Phone: [phone]; Fax: [phone]

## 2018-06-14 NOTE — Patient Instructions (Signed)
Medication Instructions:  Increase imdur to 30 mg daily   Labwork: none  Testing/Procedures: none  Follow-Up: Your physician wants you to follow-up in: 6 months.  You will receive a reminder letter in the mail two months in advance. If you don't receive a letter, please call our office to schedule the follow-up appointment.   Any Other Special Instructions Will Be Listed Below (If Applicable).     If you need a refill on your cardiac medications before your next appointment, please call your pharmacy.

## 2018-06-27 ENCOUNTER — Other Ambulatory Visit (HOSPITAL_COMMUNITY)
Admission: RE | Admit: 2018-06-27 | Discharge: 2018-06-27 | Disposition: A | Discharge status: Home / Self Care | Payer: Medicaid Other | Source: Ambulatory Visit | Attending: Physician Assistant | Admitting: Physician Assistant

## 2018-06-27 DIAGNOSIS — E1140 Type 2 diabetes mellitus with diabetic neuropathy, unspecified: Secondary | ICD-10-CM | POA: Insufficient documentation

## 2018-06-27 DIAGNOSIS — E785 Hyperlipidemia, unspecified: Secondary | ICD-10-CM | POA: Insufficient documentation

## 2018-06-27 DIAGNOSIS — I25119 Atherosclerotic heart disease of native coronary artery with unspecified angina pectoris: Secondary | ICD-10-CM | POA: Insufficient documentation

## 2018-06-27 DIAGNOSIS — E1165 Type 2 diabetes mellitus with hyperglycemia: Secondary | ICD-10-CM | POA: Insufficient documentation

## 2018-06-27 DIAGNOSIS — I10 Essential (primary) hypertension: Secondary | ICD-10-CM | POA: Insufficient documentation

## 2018-06-27 LAB — COMPREHENSIVE METABOLIC PANEL
ALK PHOS: 90 U/L (ref 38–126)
ALT: 15 U/L (ref 0–44)
ANION GAP: 11 (ref 5–15)
AST: 13 U/L — ABNORMAL LOW (ref 15–41)
Albumin: 3.7 g/dL (ref 3.5–5.0)
BUN: 20 mg/dL (ref 6–20)
CALCIUM: 8.9 mg/dL (ref 8.9–10.3)
CO2: 24 mmol/L (ref 22–32)
Chloride: 101 mmol/L (ref 98–111)
Creatinine, Ser: 1.11 mg/dL (ref 0.61–1.24)
Glucose, Bld: 374 mg/dL — ABNORMAL HIGH (ref 70–99)
Potassium: 4.4 mmol/L (ref 3.5–5.1)
Sodium: 136 mmol/L (ref 135–145)
TOTAL PROTEIN: 7.2 g/dL (ref 6.5–8.1)
Total Bilirubin: 1.1 mg/dL (ref 0.3–1.2)

## 2018-06-27 LAB — LIPID PANEL
CHOLESTEROL: 176 mg/dL (ref 0–200)
HDL: 29 mg/dL — ABNORMAL LOW (ref >40)
LDL Cholesterol: 91 mg/dL (ref 0–99)
TRIGLYCERIDES: 280 mg/dL — AB (ref <150)
Total CHOL/HDL Ratio: 6.1 RATIO
VLDL: 56 mg/dL — ABNORMAL HIGH (ref 0–40)

## 2018-06-27 LAB — HEMOGLOBIN A1C
Hgb A1c MFr Bld: 12.6 % — ABNORMAL HIGH (ref 4.8–5.6)
Mean Plasma Glucose: 314.92 mg/dL

## 2018-06-28 LAB — MICROALBUMIN, URINE: Microalb, Ur: 230.3 ug/mL — ABNORMAL HIGH

## 2018-07-03 ENCOUNTER — Encounter: Payer: Self-pay | Admitting: Physician Assistant

## 2018-07-03 ENCOUNTER — Ambulatory Visit: Payer: Medicaid Other | Admitting: Physician Assistant

## 2018-07-03 VITALS — BP 145/96 | HR 99 | Temp 97.9°F | Ht 68.0 in | Wt 172.5 lb

## 2018-07-03 DIAGNOSIS — Z91199 Patient's noncompliance with other medical treatment and regimen due to unspecified reason: Secondary | ICD-10-CM

## 2018-07-03 DIAGNOSIS — F17200 Nicotine dependence, unspecified, uncomplicated: Secondary | ICD-10-CM

## 2018-07-03 DIAGNOSIS — I10 Essential (primary) hypertension: Secondary | ICD-10-CM

## 2018-07-03 DIAGNOSIS — I25119 Atherosclerotic heart disease of native coronary artery with unspecified angina pectoris: Secondary | ICD-10-CM

## 2018-07-03 DIAGNOSIS — E785 Hyperlipidemia, unspecified: Secondary | ICD-10-CM

## 2018-07-03 DIAGNOSIS — E1165 Type 2 diabetes mellitus with hyperglycemia: Secondary | ICD-10-CM

## 2018-07-03 DIAGNOSIS — Z9119 Patient's noncompliance with other medical treatment and regimen: Secondary | ICD-10-CM

## 2018-07-03 NOTE — Progress Notes (Signed)
BP (!) 145/96 (BP Location: Right Arm, Patient Position: Sitting, Cuff Size: Normal)   Pulse 99   Temp 97.9 F (36.6 C)   Ht 5\' 8"  (1.727 m)   Wt 172 lb 8 oz (78.2 kg)   SpO2 96%   BMI 26.23 kg/m    Subjective:    Patient ID: [name] [name], male    DOB: 01/12/1971, 48 y.o.   MRN: [id number]  HPI: [name] [name] is a 48 y.o. male presenting on 07/03/2018 for Diabetes   HPI   Pt is here today for follow up on DM and having his insulin increased at last OV but Pt did not bring his bs log.  He says it runs 260-300.  Pt says he has still not gotten approved for [facility] [employer] [facility].  He went to cardiology last month  Pt has letter with him from [facility] for surveillance of Barretts Esophagus.   He is still going to [facility] for MH issues     Pt says he went to DM eye appointment in January  Relevant past medical, surgical, family and social history reviewed and updated as indicated. Interim medical history since our last visit reviewed. Allergies and medications reviewed and updated.   Current Outpatient Medications:  .  acetaminophen (TYLENOL) 325 MG tablet, Take 2 tablets (650 mg total) by mouth every 6 (six) hours as needed for moderate pain., Disp: , Rfl:  .  aspirin 81 MG tablet, Take 81 mg by mouth daily., Disp: , Rfl:  .  atorvastatin (LIPITOR) 80 MG tablet, Take 1 tablet (80 mg total) by mouth daily., Disp: 90 tablet, Rfl: 3 .  FLUoxetine (PROZAC) 20 MG capsule, Take 40 mg by mouth daily., Disp: , Rfl:  .  gabapentin (NEURONTIN) 300 MG capsule, Take 1 capsule (300 mg total) by mouth 2 (two) times daily., Disp: 60 capsule, Rfl: 2 .  hydrOXYzine (ATARAX/VISTARIL) 25 MG tablet, Take 25 mg by mouth 3 (three) times daily as needed for anxiety. , Disp: , Rfl:  .  insulin aspart (NOVOLOG) 100 UNIT/ML injection, Before each meal 3 times a day, 140-199 - 2 units, 200-250 - 4 units, 251-299 - 6 units,  300-349 - 8 units,  350 or above 10 units. Dispense syringes and needles as  needed, Ok to switch to PEN if approved. Substitute to any brand approved. DX DM2, Code E11.65, Disp: 1 vial, Rfl: 0 .  Insulin Glargine (LANTUS SOLOSTAR) 100 UNIT/ML Solostar Pen, Inject 10 Units into the skin at bedtime. (Patient taking differently: Inject 25 Units into the skin at bedtime. ), Disp: 1 pen, Rfl: 0 .  isosorbide mononitrate (IMDUR) 30 MG 24 hr tablet, Take 1 tablet (30 mg total) by mouth daily., Disp: 90 tablet, Rfl: 3 .  metFORMIN (GLUCOPHAGE) 1000 MG tablet, TAKE 1 Tablet  BY MOUTH TWICE DAILY WITH MEALS, Disp: 180 tablet, Rfl: 0 .  metoprolol tartrate (LOPRESSOR) 25 MG tablet, Take 37.5 mg by mouth 2 (two) times daily., Disp: , Rfl:  .  nitroGLYCERIN (NITROSTAT) 0.4 MG SL tablet, Place 1 tablet (0.4 mg total) under the tongue every 5 (five) minutes as needed for chest pain., Disp: 25 tablet, Rfl: 3 .  promethazine (PHENERGAN) 25 MG suppository, Place 1 suppository (25 mg total) rectally every 6 (six) hours as needed for nausea., Disp: 25 suppository, Rfl: 1 .  promethazine (PHENERGAN) 25 MG tablet, Take 1 tablet (25 mg total) by mouth every 8 (eight) hours as needed for nausea or vomiting.,  Disp: 10 tablet, Rfl: 0 .  sertraline (ZOLOFT) 100 MG tablet, Take 200 mg by mouth daily., Disp: , Rfl:  .  famotidine (PEPCID) 20 MG tablet, Take 1 tablet (20 mg total) by mouth 2 (two) times daily. (Patient not taking: Reported on 07/03/2018), Disp: 60 tablet, Rfl: 6   Review of Systems  Constitutional: Positive for chills and fatigue. Negative for appetite change, diaphoresis, fever and unexpected weight change.  HENT: Positive for drooling and ear discharge. Negative for congestion, dental problem, ear pain, facial swelling, hearing loss, mouth sores, sneezing, sore throat, trouble swallowing and voice change.   Eyes: Positive for pain, discharge, redness, itching and visual disturbance.  Respiratory: Positive for shortness of breath. Negative for cough, choking and wheezing.    Cardiovascular: Positive for chest pain. Negative for palpitations and leg swelling.  Gastrointestinal: Positive for abdominal pain and diarrhea. Negative for blood in stool, constipation and vomiting.  Endocrine: Positive for polydipsia. Negative for cold intolerance and heat intolerance.  Genitourinary: Positive for decreased urine volume. Negative for dysuria and hematuria.  Musculoskeletal: Positive for arthralgias, back pain and gait problem.  Skin: Negative for rash.  Allergic/Immunologic: Negative for environmental allergies.  Neurological: Positive for light-headedness and headaches. Negative for seizures and syncope.  Hematological: Negative for adenopathy.  Psychiatric/Behavioral: Positive for agitation and dysphoric mood. Negative for suicidal ideas. The patient is nervous/anxious.     Per HPI unless specifically indicated above     Objective:    BP (!) 145/96 (BP Location: Right Arm, Patient Position: Sitting, Cuff Size: Normal)   Pulse 99   Temp 97.9 F (36.6 C)   Ht 5\' 8"  (1.727 m)   Wt 172 lb 8 oz (78.2 kg)   SpO2 96%   BMI 26.23 kg/m   Wt Readings from Last 3 Encounters:  07/03/18 172 lb 8 oz (78.2 kg)  06/14/18 168 lb (76.2 kg)  05/01/18 175 lb (79.4 kg)    Physical Exam Vitals signs reviewed.  Constitutional:      Appearance: He is well-developed.  HENT:     Head: Normocephalic and atraumatic.  Neck:     Musculoskeletal: Neck supple.  Cardiovascular:     Rate and Rhythm: Normal rate and regular rhythm.  Pulmonary:     Effort: Pulmonary effort is normal.     Breath sounds: Normal breath sounds. No wheezing.  Abdominal:     General: Bowel sounds are normal.     Palpations: Abdomen is soft.     Tenderness: There is no abdominal tenderness.  Lymphadenopathy:     Cervical: No cervical adenopathy.  Skin:    General: Skin is warm and dry.  Neurological:     Mental Status: He is alert and oriented to person, place, and time.  Psychiatric:         Behavior: Behavior normal.     Results for orders placed or performed during the hospital encounter of 06/27/18  Hemoglobin A1c  Result Value Ref Range   Hgb A1c MFr Bld 12.6 (H) 4.8 - 5.6 %   Mean Plasma Glucose 314.92 mg/dL  Microalbumin, urine  Result Value Ref Range   Microalb, Ur 230.3 (H) Not Estab. ug/mL  Comprehensive metabolic panel  Result Value Ref Range   Sodium 136 135 - 145 mmol/L   Potassium 4.4 3.5 - 5.1 mmol/L   Chloride 101 98 - 111 mmol/L   CO2 24 22 - 32 mmol/L   Glucose, Bld 374 (H) 70 - 99 mg/dL   BUN  20 6 - 20 mg/dL   Creatinine, Ser 5.67 0.61 - 1.24 mg/dL   Calcium 8.9 8.9 - 01.4 mg/dL   Total Protein 7.2 6.5 - 8.1 g/dL   Albumin 3.7 3.5 - 5.0 g/dL   AST 13 (L) 15 - 41 U/L   ALT 15 0 - 44 U/L   Alkaline Phosphatase 90 38 - 126 U/L   Total Bilirubin 1.1 0.3 - 1.2 mg/dL   GFR calc non Af Amer >60 >60 mL/min   GFR calc Af Amer >60 >60 mL/min   Anion gap 11 5 - 15  Lipid panel  Result Value Ref Range   Cholesterol 176 0 - 200 mg/dL   Triglycerides 103 (H) <150 mg/dL   HDL 29 (L) >01 mg/dL   Total CHOL/HDL Ratio 6.1 RATIO   VLDL 56 (H) 0 - 40 mg/dL   LDL Cholesterol 91 0 - 99 mg/dL      Assessment & Plan:    Encounter Diagnoses  Name Primary?  Marland Kitchen Uncontrolled type 2 diabetes mellitus with hyperglycemia (HCC) Yes  . Personal history of noncompliance with medical treatment, presenting hazards to health   . Coronary artery disease involving native coronary artery of native heart with angina pectoris (HCC)   . Hyperlipidemia, unspecified hyperlipidemia type   . Essential hypertension   . Tobacco use disorder     -reviewed labs wiht pt -pt to Increase lantus to 35 units qhs and continue ssi novolog.  He is reminded to call office for fbs < 70 or > 300 -pt to continue other medications -will confirm as to whether [facility] accepts [facility] and if not will try to arrange for pt to be seen somewhere that does.  He says he was there  about a year ago before he lost his insurance -pt to continue with [facility] for MH issues -pt to follow up 1 month with bs log.  RTO sooner prn

## 2018-07-17 ENCOUNTER — Other Ambulatory Visit: Payer: Self-pay | Admitting: Physician Assistant

## 2018-08-01 ENCOUNTER — Encounter: Payer: Self-pay | Admitting: Physician Assistant

## 2018-08-01 ENCOUNTER — Other Ambulatory Visit: Payer: Self-pay | Admitting: Physician Assistant

## 2018-08-01 ENCOUNTER — Ambulatory Visit: Payer: Medicaid Other | Admitting: Physician Assistant

## 2018-08-01 VITALS — BP 124/80 | HR 105 | Temp 97.5°F | Ht 68.0 in | Wt 170.5 lb

## 2018-08-01 DIAGNOSIS — Z9119 Patient's noncompliance with other medical treatment and regimen: Secondary | ICD-10-CM

## 2018-08-01 DIAGNOSIS — Z8719 Personal history of other diseases of the digestive system: Secondary | ICD-10-CM

## 2018-08-01 DIAGNOSIS — E785 Hyperlipidemia, unspecified: Secondary | ICD-10-CM

## 2018-08-01 DIAGNOSIS — I10 Essential (primary) hypertension: Secondary | ICD-10-CM

## 2018-08-01 DIAGNOSIS — F17200 Nicotine dependence, unspecified, uncomplicated: Secondary | ICD-10-CM

## 2018-08-01 DIAGNOSIS — E1165 Type 2 diabetes mellitus with hyperglycemia: Secondary | ICD-10-CM

## 2018-08-01 DIAGNOSIS — F39 Unspecified mood [affective] disorder: Secondary | ICD-10-CM

## 2018-08-01 DIAGNOSIS — Z91199 Patient's noncompliance with other medical treatment and regimen due to unspecified reason: Secondary | ICD-10-CM

## 2018-08-01 DIAGNOSIS — K219 Gastro-esophageal reflux disease without esophagitis: Secondary | ICD-10-CM

## 2018-08-01 DIAGNOSIS — I25119 Atherosclerotic heart disease of native coronary artery with unspecified angina pectoris: Secondary | ICD-10-CM

## 2018-08-01 MED ORDER — GABAPENTIN 300 MG PO CAPS: 300.0000 mg | ORAL_CAPSULE | Freq: Two times a day (BID) | ORAL | 2 refills | Status: DC

## 2018-08-01 MED ORDER — INSULIN GLARGINE 100 UNIT/ML SOLOSTAR PEN: 40.0000 [IU] | PEN_INJECTOR | Freq: Every day | SUBCUTANEOUS | 0 refills | Status: DC
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
## 2018-08-03 ENCOUNTER — Encounter: Payer: Self-pay | Admitting: Physician Assistant

## 2018-08-04 ENCOUNTER — Encounter (INDEPENDENT_AMBULATORY_CARE_PROVIDER_SITE_OTHER): Payer: Self-pay | Admitting: Internal Medicine

## 2018-08-15 ENCOUNTER — Other Ambulatory Visit: Payer: Self-pay

## 2018-08-15 ENCOUNTER — Encounter (INDEPENDENT_AMBULATORY_CARE_PROVIDER_SITE_OTHER): Payer: Self-pay | Admitting: Internal Medicine

## 2018-08-15 ENCOUNTER — Ambulatory Visit (INDEPENDENT_AMBULATORY_CARE_PROVIDER_SITE_OTHER): Payer: Self-pay | Admitting: Internal Medicine

## 2018-08-15 VITALS — BP 140/84 | HR 111 | Temp 98.8°F | Ht 68.0 in | Wt 172.8 lb

## 2018-08-15 DIAGNOSIS — K2270 Barrett's esophagus without dysplasia: Secondary | ICD-10-CM

## 2018-08-15 MED ORDER — FAMOTIDINE 40 MG PO TABS: 40.0000 mg | ORAL_TABLET | Freq: Two times a day (BID) | ORAL | 4 refills | Status: DC

## 2018-08-15 NOTE — Progress Notes (Signed)
Subjective:    Patient ID: [name] [name], male    DOB: [birth year], 48 y.o.   MRN: [id number]  HPI Referred by [name] for Barrett's esophagus.  Last EGD was in January of 2017 and [facility].  A small hiatus hernia present. One tongue of salmon colored mucosa was present.  Entire examined stomach was normal.  Biopsy: Intestinal metaplasia (Goblet cell metaplasia) consistent with Barrett's esophagus. No dysplasia or malignancy.   Colon Biopsy: Benign colonic mucosa with focal ulceration and regenerative changes. No granulomas. No dysplasia or malignancy. No dysplasia or malignancy.  States he has acid reflux. Describes as a burning sensation. Is no covered with a PPI. His appetite is okay. No weight BMs are moving okay. Voiding okay.    Hx of diabetes, hypertension, gastroparesis, CAD.  Has uncontrolled diabetes. Last HA1C in January was 12.6 Review of Systems Past Medical History:  Diagnosis Date  . Anxiety   . Arthritis   . CAD (coronary artery disease)    Moderate LAD disease 2016 - Dr. Jacinto Halim  . Chronic bronchitis (HCC)   . Chronic upper back pain   . COPD (chronic obstructive pulmonary disease) (HCC)   . Depression   . Diabetic peripheral neuropathy (HCC)   . GERD (gastroesophageal reflux disease)   . History of gout   . Hyperlipemia   . Hypertension   . Migraine   . 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 Sleep apnea 2016  . Type 2 diabetes mellitus (HCC)     Past Surgical History:  Procedure Laterality Date  . ANKLE SURGERY Right 1982   "had extra bones in there; took them out"  . APPENDECTOMY  1975  . CARDIAC CATHETERIZATION N/A 03/28/2015   Procedure: Left Heart Cath and Coronary Angiography;  Surgeon: Yates Decamp, MD;  Location: [facility] INVASIVE CV LAB;  Service: Cardiovascular;  Laterality: N/A;  . CARDIAC CATHETERIZATION N/A 03/28/2015   Procedure: Intravascular Pressure Wire/FFR Study;  Surgeon: Yates Decamp, MD;  Location: [facility] INVASIVE CV LAB;  Service:  Cardiovascular;  Laterality: N/A;  . CARPAL TUNNEL RELEASE Left ~ 2008  . COLONOSCOPY WITH ESOPHAGOGASTRODUODENOSCOPY (EGD)    . ELBOW FRACTURE SURGERY Left ~ 2008  . FRACTURE SURGERY    . PILONIDAL CYST EXCISION N/A 03/24/2017   Procedure: EXCISION CHRONIC  PILONIDAL ABSCESS;  Surgeon: [name] Miyamoto, MD;  Location: [facility] ORS;  Service: General;  Laterality: N/A;  . TENDON REPAIR Left ~ 2004   "main tendon in my ankle"    Allergies  Allergen Reactions  . Omeprazole Magnesium Swelling    Face swells, no breathing impairment  . Esomeprazole Swelling    Face swells, no breathing impairment    Current Outpatient Medications on File Prior to Visit  Medication Sig Dispense Refill  . acetaminophen (TYLENOL) 325 MG tablet Take 2 tablets (650 mg total) by mouth every 6 (six) hours as needed for moderate pain.    Marland Kitchen aspirin 81 MG tablet Take 81 mg by mouth daily.    Marland Kitchen atorvastatin (LIPITOR) 80 MG tablet Take 1 tablet (80 mg total) by mouth daily. 90 tablet 3  . buPROPion (WELLBUTRIN SR) 150 MG 12 hr tablet Take 300 mg by mouth daily.    Marland Kitchen FLUoxetine (PROZAC) 20 MG capsule Take 40 mg by mouth daily.    Marland Kitchen gabapentin (NEURONTIN) 300 MG capsule Take 1 capsule (300 mg total) by mouth 2 (two) times daily. 60 capsule 2  . hydrOXYzine (ATARAX/VISTARIL) 25 MG tablet Take 25 mg  by mouth 3 (three) times daily as needed for anxiety.     . insulin aspart (NOVOLOG) 100 UNIT/ML injection Before each meal 3 times a day, 140-199 - 2 units, 200-250 - 4 units, 251-299 - 6 units,  300-349 - 8 units,  350 or above 10 units. Dispense syringes and needles as needed, Ok to switch to PEN if approved. Substitute to any brand approved. DX DM2, Code E11.65 1 vial 0  . Insulin Glargine (LANTUS SOLOSTAR) 100 UNIT/ML Solostar Pen Inject 40 Units into the skin at bedtime. 1 pen 0  . isosorbide mononitrate (IMDUR) 30 MG 24 hr tablet Take 1 tablet (30 mg total) by mouth daily. 90 tablet 3  . metFORMIN (GLUCOPHAGE) 1000 MG tablet  TAKE 1 Tablet  BY MOUTH TWICE DAILY WITH MEALS 180 tablet 0  . metoprolol tartrate (LOPRESSOR) 25 MG tablet Take 25 mg by mouth 2 (two) times daily.     . nitroGLYCERIN (NITROSTAT) 0.4 MG SL tablet Place 1 tablet (0.4 mg total) under the tongue every 5 (five) minutes as needed for chest pain. 25 tablet 3  . promethazine (PHENERGAN) 25 MG suppository Place 1 suppository (25 mg total) rectally every 6 (six) hours as needed for nausea. 25 suppository 1  . promethazine (PHENERGAN) 25 MG tablet Take 1 tablet (25 mg total) by mouth every 8 (eight) hours as needed for nausea or vomiting. 10 tablet 0   No current facility-administered medications on file prior to visit.         Objective:   Physical Exam Blood pressure 140/84, pulse (!) 111, temperature 98.8 F (37.1 C), height 5\' 8"  (1.727 m), weight 172 lb 12.8 oz (78.4 kg). Alert and oriented. Skin warm and dry. Oral mucosa is moist.   . Sclera anicteric, conjunctivae is pink. Thyroid not enlarged. No cervical lymphadenopathy. Lungs clear. Heart regular rate and rhythm.  Abdomen is soft. Bowel sounds are positive. No hepatomegaly. No abdominal masses felt. No tenderness.  No edema to lower extremities.          Assessment & Plan:  Barrett's esophagus. Needs surveillance in 2012.

## 2018-08-15 NOTE — Patient Instructions (Signed)
Rx for Pepcid sent to his pharmacy. Surveillance EGD in 2022.

## 2018-08-16 ENCOUNTER — Other Ambulatory Visit: Payer: Self-pay | Admitting: Physician Assistant

## 2018-08-16 MED ORDER — METFORMIN HCL 1000 MG PO TABS: 1000.0000 mg | ORAL_TABLET | Freq: Two times a day (BID) | ORAL | 1 refills | Status: DC

## 2018-08-16 MED ORDER — ATORVASTATIN CALCIUM 80 MG PO TABS: 80.0000 mg | ORAL_TABLET | Freq: Every day | ORAL | 1 refills | Status: DC

## 2018-10-18 ENCOUNTER — Ambulatory Visit: Payer: Medicaid Other | Admitting: Physician Assistant

## 2018-10-25 ENCOUNTER — Encounter (HOSPITAL_COMMUNITY): Payer: Self-pay

## 2018-10-25 ENCOUNTER — Emergency Department (HOSPITAL_COMMUNITY)
Admission: EM | Admit: 2018-10-25 | Discharge: 2018-10-25 | Disposition: A | Discharge status: Home / Self Care | Payer: Medicaid Other | Attending: Emergency Medicine | Admitting: Emergency Medicine

## 2018-10-25 ENCOUNTER — Other Ambulatory Visit: Payer: Self-pay

## 2018-10-25 DIAGNOSIS — F329 Major depressive disorder, single episode, unspecified: Secondary | ICD-10-CM | POA: Insufficient documentation

## 2018-10-25 DIAGNOSIS — I2510 Atherosclerotic heart disease of native coronary artery without angina pectoris: Secondary | ICD-10-CM | POA: Insufficient documentation

## 2018-10-25 DIAGNOSIS — Z7982 Long term (current) use of aspirin: Secondary | ICD-10-CM | POA: Insufficient documentation

## 2018-10-25 DIAGNOSIS — E119 Type 2 diabetes mellitus without complications: Secondary | ICD-10-CM | POA: Insufficient documentation

## 2018-10-25 DIAGNOSIS — F17210 Nicotine dependence, cigarettes, uncomplicated: Secondary | ICD-10-CM | POA: Insufficient documentation

## 2018-10-25 DIAGNOSIS — Z794 Long term (current) use of insulin: Secondary | ICD-10-CM | POA: Insufficient documentation

## 2018-10-25 DIAGNOSIS — J449 Chronic obstructive pulmonary disease, unspecified: Secondary | ICD-10-CM | POA: Insufficient documentation

## 2018-10-25 DIAGNOSIS — Z6379 Other stressful life events affecting family and household: Secondary | ICD-10-CM | POA: Insufficient documentation

## 2018-10-25 DIAGNOSIS — R45851 Suicidal ideations: Secondary | ICD-10-CM | POA: Insufficient documentation

## 2018-10-25 DIAGNOSIS — Z79899 Other long term (current) drug therapy: Secondary | ICD-10-CM | POA: Insufficient documentation

## 2018-10-25 LAB — CBC
HCT: 45.3 % (ref 39.0–52.0)
Hemoglobin: 15.5 g/dL (ref 13.0–17.0)
MCH: 31.2 pg (ref 26.0–34.0)
MCHC: 34.2 g/dL (ref 30.0–36.0)
MCV: 91.1 fL (ref 80.0–100.0)
Platelets: 245 10*3/uL (ref 150–400)
RBC: 4.97 MIL/uL (ref 4.22–5.81)
RDW: 12.2 % (ref 11.5–15.5)
WBC: 14.1 10*3/uL — ABNORMAL HIGH (ref 4.0–10.5)
nRBC: 0 % (ref 0.0–0.2)

## 2018-10-25 LAB — COMPREHENSIVE METABOLIC PANEL
ALT: 14 U/L (ref 0–44)
AST: 12 U/L — ABNORMAL LOW (ref 15–41)
Albumin: 3.7 g/dL (ref 3.5–5.0)
Alkaline Phosphatase: 102 U/L (ref 38–126)
Anion gap: 10 (ref 5–15)
BUN: 17 mg/dL (ref 6–20)
CO2: 23 mmol/L (ref 22–32)
Calcium: 9.2 mg/dL (ref 8.9–10.3)
Chloride: 102 mmol/L (ref 98–111)
Creatinine, Ser: 0.98 mg/dL (ref 0.61–1.24)
GFR calc Af Amer: >60 mL/min (ref >60)
GFR calc non Af Amer: >60 mL/min (ref >60)
Glucose, Bld: 344 mg/dL — ABNORMAL HIGH (ref 70–99)
Potassium: 4.9 mmol/L (ref 3.5–5.1)
Sodium: 135 mmol/L (ref 135–145)
Total Bilirubin: 0.7 mg/dL (ref 0.3–1.2)
Total Protein: 6.9 g/dL (ref 6.5–8.1)

## 2018-10-25 LAB — RAPID URINE DRUG SCREEN, HOSP PERFORMED
Amphetamines: NOT DETECTED
Barbiturates: NOT DETECTED
Benzodiazepines: NOT DETECTED
Cocaine: NOT DETECTED
Opiates: NOT DETECTED
Tetrahydrocannabinol: NOT DETECTED

## 2018-10-25 LAB — CBG MONITORING, ED: Glucose-Capillary: 320 mg/dL — ABNORMAL HIGH (ref 70–99)

## 2018-10-25 LAB — ETHANOL: Alcohol, Ethyl (B): <10 mg/dL (ref <10)

## 2018-10-25 LAB — ACETAMINOPHEN LEVEL: Acetaminophen (Tylenol), Serum: <10 ug/mL — ABNORMAL LOW (ref 10–30)

## 2018-10-25 LAB — SALICYLATE LEVEL: Salicylate Lvl: <7 mg/dL (ref 2.8–30.0)

## 2018-10-25 NOTE — Discharge Instructions (Addendum)
Follow up with [facility] tomorrow.

## 2018-10-25 NOTE — ED Notes (Signed)
ED Provider at bedside. 

## 2018-10-25 NOTE — ED Provider Notes (Signed)
[facility] EMERGENCY DEPARTMENT Provider Note   CSN: [id number] Arrival date & time: 10/25/18  1951    History   Chief Complaint Chief Complaint  Patient presents with  . Suicidal    HPI [name] [name] is a 48 y.o. male.     The history is provided by the patient.  Mental Health Problem  Presenting symptoms: suicidal thoughts   Degree of incapacity (severity):  Mild Onset quality:  Sudden Progression:  Resolved Chronicity:  Recurrent Context: not noncompliant   Treatment compliance:  All of the time Worsened by:  Family interactions Associated symptoms: no abdominal pain and no chest pain   Risk factors: hx of mental illness     Past Medical History:  Diagnosis Date  . Anxiety   . Arthritis   . CAD (coronary artery disease)    Moderate LAD disease 2016 - Dr. Jacinto Halim  . Chronic bronchitis (HCC)   . Chronic upper back pain   . COPD (chronic obstructive pulmonary disease) (HCC)   . Depression   . Diabetic peripheral neuropathy (HCC)   . GERD (gastroesophageal reflux disease)   . History of gout   . Hyperlipemia   . Hypertension   . Migraine   . 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 Sleep apnea 2016  . Type 2 diabetes mellitus [facility])     Patient Active Problem List   Diagnosis Date Noted  . High anion gap metabolic acidosis 03/22/2018  . Epigastric abdominal pain 03/22/2018  . Nausea vomiting and diarrhea 03/22/2018  . AKI (acute kidney injury) (HCC) 03/22/2018  . Uncontrolled diabetes mellitus (HCC) 03/22/2018  . CAD (coronary artery disease) 02/15/2018  . Hyperlipidemia 02/15/2018  . Tobacco abuse 02/15/2018  . Diabetic gastroparesis (HCC) 06/02/2017  . ARF (acute renal failure) (HCC) 05/30/2017  . DKA (diabetic ketoacidoses) (HCC) 10/15/2016  . Gastroesophageal reflux disease 10/15/2016  . Anxiety with depression 10/15/2016  . HTN (hypertension) 10/15/2016  . Leukocytosis 11/25/2015  . Intractable nausea and vomiting 11/25/2015  .  Dental abscess 11/25/2015  . Nausea and vomiting 11/25/2015  . Chest pain 03/27/2015    Past Surgical History:  Procedure Laterality Date  . ANKLE SURGERY Right 1982   "had extra bones in there; took them out"  . APPENDECTOMY  1975  . CARDIAC CATHETERIZATION N/A 03/28/2015   Procedure: Left Heart Cath and Coronary Angiography;  Surgeon: Yates Decamp, MD;  Location: [facility] INVASIVE CV LAB;  Service: Cardiovascular;  Laterality: N/A;  . CARDIAC CATHETERIZATION N/A 03/28/2015   Procedure: Intravascular Pressure Wire/FFR Study;  Surgeon: Yates Decamp, MD;  Location: [facility] INVASIVE CV LAB;  Service: Cardiovascular;  Laterality: N/A;  . CARPAL TUNNEL RELEASE Left ~ 2008  . COLONOSCOPY WITH ESOPHAGOGASTRODUODENOSCOPY (EGD)    . ELBOW FRACTURE SURGERY Left ~ 2008  . FRACTURE SURGERY    . PILONIDAL CYST EXCISION N/A 03/24/2017   Procedure: EXCISION CHRONIC  PILONIDAL ABSCESS;  Surgeon: [name] Miyamoto, MD;  Location: [facility] ORS;  Service: General;  Laterality: N/A;  . TENDON REPAIR Left ~ 2004   "main tendon in my ankle"        Home Medications    Prior to Admission medications   Medication Sig Start Date End Date Taking? Authorizing Provider  acetaminophen (TYLENOL) 325 MG tablet Take 2 tablets (650 mg total) by mouth every 6 (six) hours as needed for moderate pain. 03/25/18   Elgergawy, Leana Roe, MD  aspirin 81 MG tablet Take 81 mg by mouth daily.  [provider]  atorvastatin (LIPITOR) 80 MG tablet Take 1 tablet (80 mg total) by mouth daily. 08/16/18   Jacquelin HawkingMcElroy, Shannon, PA-C  buPROPion (WELLBUTRIN SR) 150 MG 12 hr tablet Take 300 mg by mouth daily.    [provider]  famotidine (PEPCID) 40 MG tablet Take 1 tablet (40 mg total) by mouth 2 (two) times daily. 08/15/18   Setzer, Brand Maleserri L, NP  FLUoxetine (PROZAC) 20 MG capsule Take 40 mg by mouth daily.    [provider]  gabapentin (NEURONTIN) 300 MG capsule Take 1 capsule (300 mg total) by mouth 2 (two) times daily. 08/01/18    Jacquelin HawkingMcElroy, Shannon, PA-C  hydrOXYzine (ATARAX/VISTARIL) 25 MG tablet Take 25 mg by mouth 3 (three) times daily as needed for anxiety.     [provider]  insulin aspart (NOVOLOG) 100 UNIT/ML injection Before each meal 3 times a day, 140-199 - 2 units, 200-250 - 4 units, 251-299 - 6 units,  300-349 - 8 units,  350 or above 10 units. Dispense syringes and needles as needed, Ok to switch to PEN if approved. Substitute to any brand approved. DX DM2, Code E11.65 03/19/18   Leroy SeaSingh, Prashant K, MD  Insulin Glargine (LANTUS SOLOSTAR) 100 UNIT/ML Solostar Pen Inject 40 Units into the skin at bedtime. 08/01/18   Jacquelin HawkingMcElroy, Shannon, PA-C  isosorbide mononitrate (IMDUR) 30 MG 24 hr tablet Take 1 tablet (30 mg total) by mouth daily. 06/14/18 10/25/18  Jonelle SidleMcDowell, Samuel G, MD  metFORMIN (GLUCOPHAGE) 1000 MG tablet Take 1 tablet (1,000 mg total) by mouth 2 (two) times daily with a meal. 08/16/18   Jacquelin HawkingMcElroy, Shannon, PA-C  metoprolol tartrate (LOPRESSOR) 25 MG tablet Take 25 mg by mouth 2 (two) times daily.     [provider]  nitroGLYCERIN (NITROSTAT) 0.4 MG SL tablet Place 1 tablet (0.4 mg total) under the tongue every 5 (five) minutes as needed for chest pain. 10/05/17   Jacquelin HawkingMcElroy, Shannon, PA-C  promethazine (PHENERGAN) 25 MG suppository Place 1 suppository (25 mg total) rectally every 6 (six) hours as needed for nausea. 03/19/18 03/19/19  Leroy SeaSingh, Prashant K, MD  promethazine (PHENERGAN) 25 MG tablet Take 1 tablet (25 mg total) by mouth every 8 (eight) hours as needed for nausea or vomiting. 04/03/18   Jacquelin HawkingMcElroy, Shannon, PA-C  PROZAC 40 MG capsule Take 40 mg by mouth every morning. 10/13/18   [provider]    Family History Family History  Problem Relation Age of Onset  . Congestive Heart Failure Sister   . Diabetes Mother   . Hypertension Mother   . Diabetes Father   . Hypertension Father     Social History Social History   Tobacco Use  . Smoking status: Current Every Day Smoker     Packs/day: 0.50    Years: 34.00    Pack years: 17.00    Types: Cigarettes  . Smokeless tobacco: Never Used  Substance Use Topics  . Alcohol use: Never    Alcohol/week: 0.0 standard drinks    Frequency: Never  . Drug use: Never     Allergies   Omeprazole magnesium and Esomeprazole   Review of Systems Review of Systems  Constitutional: Negative for chills and fever.  HENT: Negative for ear pain and sore throat.   Eyes: Negative for pain and visual disturbance.  Respiratory: Negative for cough and shortness of breath.   Cardiovascular: Negative for chest pain and palpitations.  Gastrointestinal: Negative for abdominal pain and vomiting.  Genitourinary: Negative for dysuria and hematuria.  Musculoskeletal: Negative for arthralgias and back pain.  Skin: Negative for color change and rash.  Neurological: Negative for seizures and syncope.  Psychiatric/Behavioral: Positive for suicidal ideas.  All other systems reviewed and are negative.    Physical Exam Updated Vital Signs  ED Triage Vitals [10/25/18 2006]  Enc Vitals Group     BP (!) 150/95     Pulse Rate 88     Resp 16     Temp 98.7 F (37.1 C)     Temp Source Oral     SpO2 99 %     Weight 170 lb (77.1 kg)     Height  (1.727 m)     Head Circumference      Peak Flow      Pain Score 6     Pain Loc      Pain Edu?      Excl. in GC?     Physical Exam Vitals signs and nursing note reviewed.  Constitutional:      Appearance: He is well-developed.  HENT:     Head: Normocephalic and atraumatic.     Nose: Nose normal.     Mouth/Throat:     Mouth: Mucous membranes are moist.  Eyes:     Extraocular Movements: Extraocular movements intact.     Conjunctiva/sclera: Conjunctivae normal.     Pupils: Pupils are equal, round, and reactive to light.  Neck:     Musculoskeletal: Normal range of motion and neck supple.  Cardiovascular:     Rate and Rhythm: Normal rate and regular rhythm.     Pulses: Normal pulses.      Heart sounds: Normal heart sounds. No murmur.  Pulmonary:     Effort: Pulmonary effort is normal. No respiratory distress.     Breath sounds: Normal breath sounds.  Abdominal:     General: Abdomen is flat. There is no distension.     Palpations: Abdomen is soft.     Tenderness: There is no abdominal tenderness.  Skin:    General: Skin is warm and dry.  Neurological:     Mental Status: He is alert.  Psychiatric:        Attention and Perception: Attention normal.        Mood and Affect: Mood normal.        Behavior: Behavior normal.        Thought Content: Thought content normal. Thought content does not include homicidal or suicidal ideation. Thought content does not include homicidal or suicidal plan.        Judgment: Judgment normal.      ED Treatments / Results  Labs (all labs ordered are listed, but only abnormal results are displayed) Labs Reviewed  COMPREHENSIVE METABOLIC PANEL - Abnormal; Notable for the following components:      Result Value   Glucose, Bld 344 (*)    AST 12 (*)    All other components within normal limits  ACETAMINOPHEN LEVEL - Abnormal; Notable for the following components:   Acetaminophen (Tylenol), Serum <10 (*)    All other components within normal limits  CBC - Abnormal; Notable for the following components:   WBC 14.1 (*)    All other components within normal limits  CBG MONITORING, ED - Abnormal; Notable for the following components:   Glucose-Capillary 320 (*)    All other components within normal limits  ETHANOL  SALICYLATE LEVEL  RAPID URINE DRUG SCREEN, HOSP PERFORMED    EKG None  Radiology No results  found.  Procedures Procedures (including critical care time)  Medications Ordered in ED Medications - No data to display   Initial Impression / Assessment and Plan / ED Course  I have reviewed the triage vital signs and the nursing notes.  Pertinent labs & imaging results that were available during my care of the  patient were reviewed by me and considered in my medical decision making (see chart for details).     [name] [name] is a 48 year old male with history of hypertension, high cholesterol, depression who presents to the ED with suicidal thoughts.  Patient normal vitals.  No fever.  Patient no longer with suicidal thoughts.  States he was in an argument with his parents tonight and that is why he endorsed suicidal thoughts.  He is calm.  He denies suicidal homicidal ideation.  He does not appear intoxicated.  Lab work was overall unremarkable.  Glucose mildly elevated.  However patient is not in DKA.  Patient states that he can stay with his niece tonight.  He is able to contract for safety with me and niece is willing to have the patient over at her house tonight to let things cool off between he and his parents.  Patient discharged from ED in good condition.  Given return precautions.  [facility].  This chart was dictated using voice recognition software.  Despite best efforts to proofread,  errors can occur which can change the documentation meaning.    Final Clinical Impressions(s) / ED Diagnoses   Final diagnoses:  Suicidal thoughts    ED Discharge Orders    None       Virgina Norfolk, DO 10/25/18 2146

## 2018-10-25 NOTE — ED Triage Notes (Signed)
Pt reports arguing with his parents tonight and now he is suicidal. Plan to PD on some pills. Pt tearful in triage.

## 2018-11-07 ENCOUNTER — Inpatient Hospital Stay (HOSPITAL_COMMUNITY): Payer: Self-pay

## 2018-11-07 ENCOUNTER — Inpatient Hospital Stay (HOSPITAL_COMMUNITY)
Admission: EM | Admit: 2018-11-07 | Discharge: 2018-11-08 | DRG: 639 | Disposition: A | Discharge status: Home / Self Care | Payer: Self-pay | Attending: Internal Medicine | Admitting: Internal Medicine

## 2018-11-07 ENCOUNTER — Emergency Department (HOSPITAL_COMMUNITY): Payer: Medicaid Other

## 2018-11-07 ENCOUNTER — Encounter (HOSPITAL_COMMUNITY): Payer: Self-pay

## 2018-11-07 ENCOUNTER — Other Ambulatory Visit: Payer: Self-pay

## 2018-11-07 DIAGNOSIS — Z833 Family history of diabetes mellitus: Secondary | ICD-10-CM

## 2018-11-07 DIAGNOSIS — E1310 Other specified diabetes mellitus with ketoacidosis without coma: Secondary | ICD-10-CM

## 2018-11-07 DIAGNOSIS — J449 Chronic obstructive pulmonary disease, unspecified: Secondary | ICD-10-CM | POA: Diagnosis present

## 2018-11-07 DIAGNOSIS — E1110 Type 2 diabetes mellitus with ketoacidosis without coma: Principal | ICD-10-CM | POA: Diagnosis present

## 2018-11-07 DIAGNOSIS — Z1159 Encounter for screening for other viral diseases: Secondary | ICD-10-CM

## 2018-11-07 DIAGNOSIS — Z9114 Patient's other noncompliance with medication regimen: Secondary | ICD-10-CM

## 2018-11-07 DIAGNOSIS — Z888 Allergy status to other drugs, medicaments and biological substances status: Secondary | ICD-10-CM

## 2018-11-07 DIAGNOSIS — E1143 Type 2 diabetes mellitus with diabetic autonomic (poly)neuropathy: Secondary | ICD-10-CM | POA: Diagnosis present

## 2018-11-07 DIAGNOSIS — E1142 Type 2 diabetes mellitus with diabetic polyneuropathy: Secondary | ICD-10-CM | POA: Diagnosis present

## 2018-11-07 DIAGNOSIS — I2510 Atherosclerotic heart disease of native coronary artery without angina pectoris: Secondary | ICD-10-CM | POA: Diagnosis present

## 2018-11-07 DIAGNOSIS — F419 Anxiety disorder, unspecified: Secondary | ICD-10-CM | POA: Diagnosis present

## 2018-11-07 DIAGNOSIS — Z79899 Other long term (current) drug therapy: Secondary | ICD-10-CM

## 2018-11-07 DIAGNOSIS — E785 Hyperlipidemia, unspecified: Secondary | ICD-10-CM | POA: Diagnosis present

## 2018-11-07 DIAGNOSIS — F17200 Nicotine dependence, unspecified, uncomplicated: Secondary | ICD-10-CM | POA: Diagnosis present

## 2018-11-07 DIAGNOSIS — K2270 Barrett's esophagus without dysplasia: Secondary | ICD-10-CM | POA: Diagnosis present

## 2018-11-07 DIAGNOSIS — I10 Essential (primary) hypertension: Secondary | ICD-10-CM | POA: Diagnosis present

## 2018-11-07 DIAGNOSIS — Z794 Long term (current) use of insulin: Secondary | ICD-10-CM

## 2018-11-07 DIAGNOSIS — Z8249 Family history of ischemic heart disease and other diseases of the circulatory system: Secondary | ICD-10-CM

## 2018-11-07 DIAGNOSIS — F329 Major depressive disorder, single episode, unspecified: Secondary | ICD-10-CM | POA: Diagnosis present

## 2018-11-07 DIAGNOSIS — K219 Gastro-esophageal reflux disease without esophagitis: Secondary | ICD-10-CM | POA: Diagnosis present

## 2018-11-07 DIAGNOSIS — R112 Nausea with vomiting, unspecified: Secondary | ICD-10-CM | POA: Diagnosis present

## 2018-11-07 DIAGNOSIS — Z7982 Long term (current) use of aspirin: Secondary | ICD-10-CM

## 2018-11-07 DIAGNOSIS — G8929 Other chronic pain: Secondary | ICD-10-CM | POA: Diagnosis present

## 2018-11-07 DIAGNOSIS — R197 Diarrhea, unspecified: Secondary | ICD-10-CM

## 2018-11-07 DIAGNOSIS — Z720 Tobacco use: Secondary | ICD-10-CM

## 2018-11-07 DIAGNOSIS — K3184 Gastroparesis: Secondary | ICD-10-CM | POA: Diagnosis present

## 2018-11-07 DIAGNOSIS — I160 Hypertensive urgency: Secondary | ICD-10-CM | POA: Diagnosis present

## 2018-11-07 DIAGNOSIS — F17210 Nicotine dependence, cigarettes, uncomplicated: Secondary | ICD-10-CM | POA: Diagnosis present

## 2018-11-07 DIAGNOSIS — E782 Mixed hyperlipidemia: Secondary | ICD-10-CM | POA: Diagnosis present

## 2018-11-07 LAB — BASIC METABOLIC PANEL
Anion gap: 14 (ref 5–15)
Anion gap: 12 (ref 5–15)
Anion gap: 9 (ref 5–15)
BUN: 15 mg/dL (ref 6–20)
BUN: 14 mg/dL (ref 6–20)
BUN: 14 mg/dL (ref 6–20)
CO2: 21 mmol/L — ABNORMAL LOW (ref 22–32)
CO2: 23 mmol/L (ref 22–32)
CO2: 25 mmol/L (ref 22–32)
Calcium: 8.6 mg/dL — ABNORMAL LOW (ref 8.9–10.3)
Calcium: 8.5 mg/dL — ABNORMAL LOW (ref 8.9–10.3)
Calcium: 8.3 mg/dL — ABNORMAL LOW (ref 8.9–10.3)
Chloride: 104 mmol/L (ref 98–111)
Chloride: 106 mmol/L (ref 98–111)
Chloride: 105 mmol/L (ref 98–111)
Creatinine, Ser: 1.07 mg/dL (ref 0.61–1.24)
Creatinine, Ser: 1.24 mg/dL (ref 0.61–1.24)
Creatinine, Ser: 1.11 mg/dL (ref 0.61–1.24)
GFR calc Af Amer: >60 mL/min (ref >60)
GFR calc Af Amer: >60 mL/min (ref >60)
GFR calc Af Amer: >60 mL/min (ref >60)
GFR calc non Af Amer: >60 mL/min (ref >60)
GFR calc non Af Amer: >60 mL/min (ref >60)
GFR calc non Af Amer: >60 mL/min (ref >60)
Glucose, Bld: 231 mg/dL — ABNORMAL HIGH (ref 70–99)
Glucose, Bld: 186 mg/dL — ABNORMAL HIGH (ref 70–99)
Glucose, Bld: 112 mg/dL — ABNORMAL HIGH (ref 70–99)
Potassium: 4.2 mmol/L (ref 3.5–5.1)
Potassium: 4.1 mmol/L (ref 3.5–5.1)
Potassium: 4 mmol/L (ref 3.5–5.1)
Sodium: 139 mmol/L (ref 135–145)
Sodium: 141 mmol/L (ref 135–145)
Sodium: 139 mmol/L (ref 135–145)

## 2018-11-07 LAB — CBC WITH DIFFERENTIAL/PLATELET
Abs Immature Granulocytes: 0.09 10*3/uL — ABNORMAL HIGH (ref 0.00–0.07)
Basophils Absolute: 0.1 10*3/uL (ref 0.0–0.1)
Basophils Relative: 1 %
Eosinophils Absolute: 0.1 10*3/uL (ref 0.0–0.5)
Eosinophils Relative: 1 %
HCT: 48.8 % (ref 39.0–52.0)
Hemoglobin: 16.8 g/dL (ref 13.0–17.0)
Immature Granulocytes: 1 %
Lymphocytes Relative: 14 %
Lymphs Abs: 2.1 10*3/uL (ref 0.7–4.0)
MCH: 31.3 pg (ref 26.0–34.0)
MCHC: 34.4 g/dL (ref 30.0–36.0)
MCV: 90.9 fL (ref 80.0–100.0)
Monocytes Absolute: 0.6 10*3/uL (ref 0.1–1.0)
Monocytes Relative: 4 %
Neutro Abs: 12.4 10*3/uL — ABNORMAL HIGH (ref 1.7–7.7)
Neutrophils Relative %: 79 %
Platelets: 233 10*3/uL (ref 150–400)
RBC: 5.37 MIL/uL (ref 4.22–5.81)
RDW: 11.9 % (ref 11.5–15.5)
WBC: 15.3 10*3/uL — ABNORMAL HIGH (ref 4.0–10.5)
nRBC: 0 % (ref 0.0–0.2)

## 2018-11-07 LAB — COMPREHENSIVE METABOLIC PANEL
ALT: 20 U/L (ref 0–44)
AST: 18 U/L (ref 15–41)
Albumin: 4.5 g/dL (ref 3.5–5.0)
Alkaline Phosphatase: 112 U/L (ref 38–126)
BUN: 20 mg/dL (ref 6–20)
CO2: 18 mmol/L — ABNORMAL LOW (ref 22–32)
Calcium: 9.4 mg/dL (ref 8.9–10.3)
Chloride: 96 mmol/L — ABNORMAL LOW (ref 98–111)
Creatinine, Ser: 1.25 mg/dL — ABNORMAL HIGH (ref 0.61–1.24)
GFR calc Af Amer: >60 mL/min (ref >60)
GFR calc non Af Amer: >60 mL/min (ref >60)
Glucose, Bld: 529 mg/dL (ref 70–99)
Potassium: 4.6 mmol/L (ref 3.5–5.1)
Sodium: 135 mmol/L (ref 135–145)
Total Bilirubin: 1.4 mg/dL — ABNORMAL HIGH (ref 0.3–1.2)
Total Protein: 8.3 g/dL — ABNORMAL HIGH (ref 6.5–8.1)

## 2018-11-07 LAB — HEMOGLOBIN A1C
Hgb A1c MFr Bld: 12.8 % — ABNORMAL HIGH (ref 4.8–5.6)
Mean Plasma Glucose: 320.66 mg/dL

## 2018-11-07 LAB — GLUCOSE, CAPILLARY
Glucose-Capillary: 331 mg/dL — ABNORMAL HIGH (ref 70–99)
Glucose-Capillary: 289 mg/dL — ABNORMAL HIGH (ref 70–99)
Glucose-Capillary: 236 mg/dL — ABNORMAL HIGH (ref 70–99)
Glucose-Capillary: 202 mg/dL — ABNORMAL HIGH (ref 70–99)
Glucose-Capillary: 179 mg/dL — ABNORMAL HIGH (ref 70–99)
Glucose-Capillary: 218 mg/dL — ABNORMAL HIGH (ref 70–99)
Glucose-Capillary: 171 mg/dL — ABNORMAL HIGH (ref 70–99)
Glucose-Capillary: 126 mg/dL — ABNORMAL HIGH (ref 70–99)
Glucose-Capillary: 103 mg/dL — ABNORMAL HIGH (ref 70–99)
Glucose-Capillary: 134 mg/dL — ABNORMAL HIGH (ref 70–99)
Glucose-Capillary: 107 mg/dL — ABNORMAL HIGH (ref 70–99)
Glucose-Capillary: 124 mg/dL — ABNORMAL HIGH (ref 70–99)

## 2018-11-07 LAB — SARS CORONAVIRUS 2 BY RT PCR (HOSPITAL ORDER, PERFORMED IN ~~LOC~~ HOSPITAL LAB): SARS Coronavirus 2: NEGATIVE

## 2018-11-07 LAB — CBG MONITORING, ED
Glucose-Capillary: 523 mg/dL (ref 70–99)
Glucose-Capillary: 393 mg/dL — ABNORMAL HIGH (ref 70–99)

## 2018-11-07 LAB — LIPASE, BLOOD: Lipase: 24 U/L (ref 11–51)

## 2018-11-07 LAB — MRSA PCR SCREENING: MRSA by PCR: NEGATIVE

## 2018-11-07 MED ORDER — HYDROXYZINE HCL 25 MG PO TABS: 25.0000 mg | ORAL_TABLET | Freq: Three times a day (TID) | ORAL | Status: DC | PRN

## 2018-11-07 MED ORDER — DEXTROSE-NACL 5-0.45 % IV SOLN
INTRAVENOUS | Status: DC
  Administered 2018-11-07 (×2): via INTRAVENOUS

## 2018-11-07 MED ORDER — HYDRALAZINE HCL 20 MG/ML IJ SOLN: 10.0000 mg | Freq: Four times a day (QID) | INTRAMUSCULAR | Status: DC | PRN

## 2018-11-07 MED ORDER — METOPROLOL TARTRATE 5 MG/5ML IV SOLN
2.5000 mg | Freq: Four times a day (QID) | INTRAVENOUS | Status: DC
  Administered 2018-11-07 – 2018-11-08 (×5): 2.5 mg via INTRAVENOUS
  Filled 2018-11-07 (×5): qty 5

## 2018-11-07 MED ORDER — SODIUM CHLORIDE 0.9 % IV BOLUS
1000.0000 mL | Freq: Once | INTRAVENOUS | Status: AC
  Administered 2018-11-07: 1000 mL via INTRAVENOUS

## 2018-11-07 MED ORDER — BUPROPION HCL ER (XL) 150 MG PO TB24
300.0000 mg | ORAL_TABLET | Freq: Every day | ORAL | Status: DC
  Administered 2018-11-07 – 2018-11-08 (×2): 300 mg via ORAL
  Filled 2018-11-07 (×2): qty 2

## 2018-11-07 MED ORDER — IOHEXOL 300 MG/ML  SOLN
30.0000 mL | Freq: Once | INTRAMUSCULAR | Status: AC | PRN
  Administered 2018-11-07: 30 mL via ORAL

## 2018-11-07 MED ORDER — INSULIN ASPART 100 UNIT/ML ~~LOC~~ SOLN
0.0000 [IU] | Freq: Three times a day (TID) | SUBCUTANEOUS | Status: DC
  Administered 2018-11-08: 3 [IU] via SUBCUTANEOUS
  Administered 2018-11-08: 5 [IU] via SUBCUTANEOUS

## 2018-11-07 MED ORDER — ISOSORBIDE MONONITRATE ER 30 MG PO TB24
30.0000 mg | ORAL_TABLET | Freq: Every day | ORAL | Status: DC
  Administered 2018-11-07 – 2018-11-08 (×2): 30 mg via ORAL
  Filled 2018-11-07 (×2): qty 1

## 2018-11-07 MED ORDER — GABAPENTIN 300 MG PO CAPS
300.0000 mg | ORAL_CAPSULE | Freq: Two times a day (BID) | ORAL | Status: DC
  Administered 2018-11-07 – 2018-11-08 (×2): 300 mg via ORAL
  Filled 2018-11-07 (×2): qty 1

## 2018-11-07 MED ORDER — SODIUM CHLORIDE 0.9 % IV SOLN
INTRAVENOUS | Status: DC
  Administered 2018-11-07: 12:00:00 via INTRAVENOUS

## 2018-11-07 MED ORDER — SODIUM CHLORIDE 0.9 % IV SOLN
INTRAVENOUS | Status: DC
  Administered 2018-11-07: 11:00:00 via INTRAVENOUS

## 2018-11-07 MED ORDER — FAMOTIDINE IN NACL 20-0.9 MG/50ML-% IV SOLN
20.0000 mg | Freq: Two times a day (BID) | INTRAVENOUS | Status: DC
  Administered 2018-11-07: 20 mg via INTRAVENOUS
  Filled 2018-11-07: qty 50

## 2018-11-07 MED ORDER — DEXTROSE-NACL 5-0.45 % IV SOLN: INTRAVENOUS | Status: DC

## 2018-11-07 MED ORDER — INSULIN REGULAR(HUMAN) IN NACL 100-0.9 UT/100ML-% IV SOLN
INTRAVENOUS | Status: DC
  Administered 2018-11-07: 4.7 [IU]/h via INTRAVENOUS
  Filled 2018-11-07: qty 100

## 2018-11-07 MED ORDER — ATORVASTATIN CALCIUM 40 MG PO TABS
80.0000 mg | ORAL_TABLET | Freq: Every day | ORAL | Status: DC
  Administered 2018-11-07 – 2018-11-08 (×2): 80 mg via ORAL
  Filled 2018-11-07 (×2): qty 2

## 2018-11-07 MED ORDER — METOPROLOL TARTRATE 25 MG PO TABS
25.0000 mg | ORAL_TABLET | Freq: Two times a day (BID) | ORAL | Status: DC
  Administered 2018-11-07 – 2018-11-08 (×2): 25 mg via ORAL
  Filled 2018-11-07 (×2): qty 1

## 2018-11-07 MED ORDER — PROMETHAZINE HCL 25 MG/ML IJ SOLN
12.5000 mg | Freq: Once | INTRAMUSCULAR | Status: AC
  Administered 2018-11-07: 12.5 mg via INTRAVENOUS
  Filled 2018-11-07: qty 1

## 2018-11-07 MED ORDER — TRAMADOL HCL 50 MG PO TABS
50.0000 mg | ORAL_TABLET | Freq: Once | ORAL | Status: AC
  Administered 2018-11-07: 50 mg via ORAL
  Filled 2018-11-07: qty 1

## 2018-11-07 MED ORDER — FLUOXETINE HCL 20 MG PO CAPS
40.0000 mg | ORAL_CAPSULE | Freq: Every morning | ORAL | Status: DC
  Administered 2018-11-07 – 2018-11-08 (×2): 40 mg via ORAL
  Filled 2018-11-07 (×2): qty 2

## 2018-11-07 MED ORDER — INSULIN ASPART 100 UNIT/ML ~~LOC~~ SOLN: 0.0000 [IU] | Freq: Every day | SUBCUTANEOUS | Status: DC

## 2018-11-07 MED ORDER — DEXTROSE 50 % IV SOLN: 25.0000 mL | INTRAVENOUS | Status: DC | PRN

## 2018-11-07 MED ORDER — ASPIRIN EC 81 MG PO TBEC
81.0000 mg | DELAYED_RELEASE_TABLET | Freq: Every day | ORAL | Status: DC
  Administered 2018-11-07: 81 mg via ORAL
  Filled 2018-11-07: qty 1

## 2018-11-07 MED ORDER — INSULIN GLARGINE 100 UNIT/ML ~~LOC~~ SOLN
18.0000 [IU] | Freq: Every day | SUBCUTANEOUS | Status: DC
  Administered 2018-11-07: 18 [IU] via SUBCUTANEOUS
  Filled 2018-11-07 (×2): qty 0.18

## 2018-11-07 MED ORDER — ENOXAPARIN SODIUM 40 MG/0.4ML ~~LOC~~ SOLN
40.0000 mg | SUBCUTANEOUS | Status: DC
  Administered 2018-11-07: 40 mg via SUBCUTANEOUS
  Filled 2018-11-07: qty 0.4

## 2018-11-07 MED ORDER — POTASSIUM CHLORIDE 10 MEQ/100ML IV SOLN
10.0000 meq | INTRAVENOUS | Status: AC
  Administered 2018-11-07 (×2): 10 meq via INTRAVENOUS
  Filled 2018-11-07 (×2): qty 100

## 2018-11-07 MED ORDER — FAMOTIDINE IN NACL 20-0.9 MG/50ML-% IV SOLN
20.0000 mg | Freq: Once | INTRAVENOUS | Status: AC
  Administered 2018-11-07: 20 mg via INTRAVENOUS
  Filled 2018-11-07: qty 50

## 2018-11-07 MED ORDER — INSULIN REGULAR(HUMAN) IN NACL 100-0.9 UT/100ML-% IV SOLN
INTRAVENOUS | Status: DC
  Administered 2018-11-07: 3.3 [IU]/h via INTRAVENOUS

## 2018-11-07 NOTE — ED Notes (Signed)
Provider notified of critical glucose: 529

## 2018-11-07 NOTE — ED Notes (Signed)
Pt blood pressure is high pt is vomiting

## 2018-11-07 NOTE — ED Notes (Signed)
Pt returned from xray

## 2018-11-07 NOTE — ED Notes (Signed)
Pharmacy notified to verify home medications

## 2018-11-07 NOTE — ED Notes (Signed)
Provider notified pt is hypertensive.

## 2018-11-07 NOTE — H&P (Signed)
History and Physical  [name] PettyDanny L Mcneary ZOX:[id number] DOB: 04-Aug-1970 DOA: 11/07/2018   PCP: Jacquelin HawkingMcElroy, Shannon, PA-C   Patient coming from: Home  Chief Complaint: n/v/d  HPI:  [name] [name] is a 48 y.o. male with medical history of uncontrolled diabetes mellitus type 2, Barrett's esophagus, coronary artery disease, hyperlipidemia, tobacco abuse, noncompliance presenting with 3-day history of nausea, vomiting, diarrhea, abdominal pain.  The patient states that for the past 2 days he has had 3-4 loose bowel movements without hematochezia or melena.  He has been taking ibuprofen for the last 3 days for his abdominal pain without much relief.  Because of continued vomiting and abdominal pain, the patient states that he has not taken his insulin release for the past 2 days.  Upon further questioning, the patient states that he is only been taking his insulin approximately 3 to 4 days/week.  He states that he does not have a PCP, but frequently goes to the urgent care for his refills.  He denies any recent antibiotics or other over-the-counter medications or new medications.  He denies any fevers, chills, chest pain, sore throat, coughing, hemoptysis, hematemesis. He denies any recent sick contacts.  In the emergency department, the patient was afebrile hemodynamically stable saturating 99% room air.  He had hypertensive urgency with a blood pressure of 213/33.  The patient was started IV fluids and insulin drip.  He had a serum glucose of 529 with anion gap of 21.  Acute abdominal series was negative.  WBC is 15.3.  Lipase was 24.  Assessment/Plan: DKA type II -The patient has documented history of poor compliance -patient started on IV insulin with q 1 hour CBG check and q 4 hour BMPs -pt started on aggressive fluid resuscitation -Electrolytes were monitored and repleted -transitioned to [place] insulin once anion gap closed -diet was advanced once anion gap closed -06/27/18 HbA1C--12.6  Nausea,  vomiting/diarrhea/abdominal pain -Stool pathogen panel -Stool for C. Difficile -Acute abdominal series nonobstructive -CT abdomen and pelvis -unable to tolerate PPI, continue pepcid  Coronary artery disease -No chest pain presently -Continue aspirin -Resume Toprol tartrate once able to tolerate p.o. -12/2017 myoview--low risk  Uncontrolled diabetes mellitus type 2 with hyperglycemia -Repeat hemoglobin A1c -Transition to subcutaneous insulin once anion gap closes -Holding metformin  Hypertensive urgency -Lopressor IV until able to tolerate p.o. -Resume metoprolol tartrate once able to tolerate p.o.  Hyperlipidemia -Continue statin  Tobacco abuse I have discussed tobacco cessation with the patient.  I have counseled the patient regarding the negative impacts of continued tobacco use including but not limited to lung cancer, COPD, and cardiovascular disease.  I have discussed alternatives to tobacco and modalities that may help facilitate tobacco cessation including but not limited to biofeedback, hypnosis, and medications.  Total time spent with tobacco counseling was 4 minutes.            Past Medical History:  Diagnosis Date  . Anxiety   . Arthritis   . CAD (coronary artery disease)    Moderate LAD disease 2016 - Dr. Jacinto HalimGanji  . Chronic bronchitis (HCC)   . Chronic upper back pain   . COPD (chronic obstructive pulmonary disease) (HCC)   . Depression   . Diabetic peripheral neuropathy (HCC)   . GERD (gastroesophageal reflux disease)   . History of gout   . Hyperlipemia   . Hypertension   . Migraine   . 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 Sleep apnea 2016  .  Type 2 diabetes mellitus ([place])    Past Surgical History:  Procedure Laterality Date  . ANKLE SURGERY Right 1982   "had extra bones in there; took them out"  . APPENDECTOMY  1975  . CARDIAC CATHETERIZATION N/A 03/28/2015   Procedure: Left Heart Cath and Coronary Angiography;  Surgeon: Adrian Prows, MD;   Location: [facility];  Service: Cardiovascular;  Laterality: N/A;  . CARDIAC CATHETERIZATION N/A 03/28/2015   Procedure: Intravascular Pressure Wire/FFR Study;  Surgeon: Adrian Prows, MD;  Location: [facility];  Service: Cardiovascular;  Laterality: N/A;  . CARPAL TUNNEL RELEASE Left ~ 2008  . COLONOSCOPY WITH ESOPHAGOGASTRODUODENOSCOPY (EGD)    . ELBOW FRACTURE SURGERY Left ~ 2008  . FRACTURE SURGERY    . PILONIDAL CYST EXCISION N/A 03/24/2017   Procedure: EXCISION CHRONIC  PILONIDAL ABSCESS;  Surgeon: Coralie Keens, MD;  Location: [facility];  Service: General;  Laterality: N/A;  . TENDON REPAIR Left ~ 2004   "main tendon in my ankle"   Social History:  reports that he has been smoking cigarettes. He has a 17.00 pack-year smoking history. He has never used smokeless tobacco. He reports that he does not drink alcohol or use drugs.   Family History  Problem Relation Age of Onset  . Congestive Heart Failure Sister   . Diabetes Mother   . Hypertension Mother   . Diabetes Father   . Hypertension Father      Allergies  Allergen Reactions  . Omeprazole Magnesium Swelling    Face swells, no breathing impairment  . Esomeprazole Swelling    Face swells, no breathing impairment     Prior to Admission medications   Medication Sig Start Date End Date Taking? Authorizing Provider  acetaminophen (TYLENOL) 325 MG tablet Take 2 tablets (650 mg total) by mouth every 6 (six) hours as needed for moderate pain. 03/25/18  Yes Elgergawy, Silver Huguenin, MD  aspirin 81 MG tablet Take 81 mg by mouth at bedtime.    Yes [provider]  atorvastatin (LIPITOR) 80 MG tablet Take 1 tablet (80 mg total) by mouth daily. 08/16/18  Yes Soyla Dryer, PA-C  buPROPion (WELLBUTRIN SR) 150 MG 12 hr tablet Take 300 mg by mouth daily.   Yes [provider]  famotidine (PEPCID) 40 MG tablet Take 1 tablet (40 mg total) by mouth 2 (two) times daily. 08/15/18  Yes Setzer, Terri L, NP   gabapentin (NEURONTIN) 300 MG capsule Take 1 capsule (300 mg total) by mouth 2 (two) times daily. 08/01/18  Yes Soyla Dryer, PA-C  hydrOXYzine (ATARAX/VISTARIL) 25 MG tablet Take 25 mg by mouth 3 (three) times daily as needed for anxiety.    Yes [provider]  insulin aspart (NOVOLOG) 100 UNIT/ML injection Before each meal 3 times a day, 140-199 - 2 units, 200-250 - 4 units, 251-299 - 6 units,  300-349 - 8 units,  350 or above 10 units. Dispense syringes and needles as needed, Ok to switch to PEN if approved. Substitute to any brand approved. DX DM2, Code E11.65 03/19/18  Yes Thurnell Lose, MD  Insulin Glargine (LANTUS SOLOSTAR) 100 UNIT/ML Solostar Pen Inject 40 Units into the skin at bedtime. 08/01/18  Yes Soyla Dryer, PA-C  isosorbide mononitrate (IMDUR) 30 MG 24 hr tablet Take 1 tablet (30 mg total) by mouth daily. 06/14/18 11/07/18 Yes Satira Sark, MD  metFORMIN (GLUCOPHAGE) 1000 MG tablet Take 1 tablet (1,000 mg total) by mouth 2 (two) times daily with a meal.  08/16/18  Yes Jacquelin HawkingMcElroy, Shannon, PA-C  metoprolol tartrate (LOPRESSOR) 25 MG tablet Take 25 mg by mouth 2 (two) times daily.    Yes [provider]  nitroGLYCERIN (NITROSTAT) 0.4 MG SL tablet Place 1 tablet (0.4 mg total) under the tongue every 5 (five) minutes as needed for chest pain. 10/05/17  Yes Jacquelin HawkingMcElroy, Shannon, PA-C  promethazine (PHENERGAN) 25 MG suppository Place 1 suppository (25 mg total) rectally every 6 (six) hours as needed for nausea. 03/19/18 03/19/19 Yes Leroy SeaSingh, Prashant K, MD  promethazine (PHENERGAN) 25 MG tablet Take 1 tablet (25 mg total) by mouth every 8 (eight) hours as needed for nausea or vomiting. 04/03/18  Yes Jacquelin HawkingMcElroy, Shannon, PA-C  PROZAC 40 MG capsule Take 40 mg by mouth every morning. 10/13/18  Yes [provider]    Review of Systems:  Constitutional:  No weight loss, night sweats, Fevers, chills, fatigue.  Head&Eyes: No headache.  No vision loss.  No eye pain or  scotoma ENT:  No Difficulty swallowing,Tooth/dental problems,Sore throat,  No ear ache, post nasal drip,  Cardio-vascular:  No chest pain, Orthopnea, PND, swelling in lower extremities,  dizziness, palpitations  GI:  No   hematochezia, melena, heartburn, indigestion, Resp:  No shortness of breath with exertion or at rest. No cough. No coughing up of blood .No wheezing.No chest wall deformity  Skin:  no rash or lesions.  GU:  no dysuria, change in color of urine, no urgency or frequency. No flank pain.  Musculoskeletal:  No joint pain or swelling. No decreased range of motion. No back pain.  Psych:  No change in mood or affect. No depression or anxiety. Neurologic: No headache, no dysesthesia, no focal weakness, no vision loss. No syncope  Physical Exam: Vitals:   11/07/18 0647 11/07/18 0656 11/07/18 0900  BP:  (!) 146/114 (!) 214/133  Pulse:  (!) 21 (!) 101  Resp:  (!) 21   Temp:  98.1 F (36.7 C)   TempSrc:  Oral   SpO2:  99% 96%  Weight: 77.1 kg    Height: 5\' 8"  (1.727 m)     General:  A&O x 3, NAD, nontoxic, pleasant/cooperative Head/Eye: No conjunctival hemorrhage, no icterus, [place]/AT, No nystagmus ENT:  No icterus,  No thrush, good dentition, no pharyngeal exudate Neck:  No masses, no lymphadenpathy, no bruits CV:  RRR, no rub, no gallop, no S3 Lung:  Bibasilar crackles, no wheeze Abdomen: soft/lower abd pain, +BS, nondistended, no peritoneal signs Ext: No cyanosis, No rashes, No petechiae, No lymphangitis, No edema Neuro: CNII-XII intact, strength 4/5 in bilateral upper and lower extremities, no dysmetria  Labs on Admission:  Basic Metabolic Panel: Recent Labs  Lab 11/07/18 0745  NA 135  K 4.6  CL 96*  CO2 18*  GLUCOSE 529*  BUN 20  CREATININE 1.25*  CALCIUM 9.4   Liver Function Tests: Recent Labs  Lab 11/07/18 0745  AST 18  ALT 20  ALKPHOS 112  BILITOT 1.4*  PROT 8.3*  ALBUMIN 4.5   Recent Labs  Lab 11/07/18 0745  LIPASE 24   No results  for input(s): AMMONIA in the last 168 hours. CBC: Recent Labs  Lab 11/07/18 0745  WBC 15.3*  NEUTROABS 12.4*  HGB 16.8  HCT 48.8  MCV 90.9  PLT 233   Coagulation Profile: No results for input(s): INR, PROTIME in the last 168 hours. Cardiac Enzymes: No results for input(s): CKTOTAL, CKMB, CKMBINDEX, TROPONINI in the last 168 hours. BNP: Invalid input(s): POCBNP CBG: Recent Labs  Lab 11/07/18 0943  GLUCAP 523*   Urine analysis:    Component Value Date/Time   COLORURINE YELLOW 03/22/2018 0214   APPEARANCEUR CLEAR 03/22/2018 0214   LABSPEC 1.014 03/22/2018 0214   PHURINE 5.0 03/22/2018 0214   GLUCOSEU NEGATIVE 03/22/2018 0214   HGBUR SMALL (A) 03/22/2018 0214   BILIRUBINUR negative 04/05/2018 1022   KETONESUR 80 (A) 03/22/2018 0214   PROTEINUR Positive (A) 04/05/2018 1022   PROTEINUR 30 (A) 03/22/2018 0214   UROBILINOGEN 0.2 04/05/2018 1022   NITRITE negative 04/05/2018 1022   NITRITE NEGATIVE 03/22/2018 0214   LEUKOCYTESUR Negative 04/05/2018 1022   Sepsis Labs: @LABRCNTIP (procalcitonin:4,lacticidven:4) )No results found for this or any previous visit (from the past 240 hour(s)).   Radiological Exams on Admission: Dg Abd Acute W/chest  Result Date: 11/07/2018 CLINICAL DATA:  Chest pain EXAM: DG ABDOMEN ACUTE W/ 1V CHEST COMPARISON:  March 16, 2018 chest radiograph; abdomen series June 13, 2017 FINDINGS: PA chest: Lungs are clear. Heart size and pulmonary vascularity are normal. No adenopathy. Supine and upright abdomen: There is moderate stool throughout the colon. There is no appreciable bowel dilatation or air-fluid level to suggest bowel obstruction. No free air. There are scattered vascular calcifications in the pelvis. IMPRESSION: Moderate stool in colon. No bowel obstruction or free air. Lungs clear. Electronically Signed   By: Bretta BangWilliam  Woodruff III M.D.   On: 11/07/2018 08:33        Time spent:60 minutes Code Status:  FULL Family Communication:  No  Family at bedside Disposition Plan: expect 1-2 day hospitalization Consults called: none DVT Prophylaxis: [place] Lovenox  Catarina HartshornDavid Adeleigh Barletta, DO  [facility] Pager [phone]  If 7PM-7AM, please contact night-coverage [email or web address] Password [email or web address] 11/07/2018, 10:01 AM

## 2018-11-07 NOTE — Progress Notes (Signed)
Inpatient Diabetes Program Recommendations  AACE/ADA: New Consensus Statement on Inpatient Glycemic Control (2015)  Target Ranges:  Prepandial:   less than 140 mg/dL      Peak postprandial:   less than 180 mg/dL (1-2 hours)      Critically ill patients:  140 - 180 mg/dL   Lab Results  Component Value Date   QZRAQT 622 (HH) 11/07/2018   HGBA1C 12.6 (H) 06/27/2018    Review of Glycemic Control  Diabetes history: DM Outpatient Diabetes medications: Lantus 40 units qd + Novolog correction scale tid ac meals (Patient had been on Lantus 10 units on D/C from hospital 03/25/18 then increased to 40 units on 08/01/18 @ office visit) Current orders for Inpatient glycemic control: IV insulin  Inpatient Diabetes Program Recommendations:   When patient ready to transition to subcutaneous insulin, please give basal 2 hrs. Prior to IV drip discontinued & cover CBG with Novolog correction @ time of IV drip discontinued. For basal, please consider Lantus 18 units daily + Novolog sensitive correction scale on transition.   Will plan to speak with patient when admitted.  Thank you, Nani Gasser. Falan Hensler, RN, MSN, CDE  Diabetes Coordinator Inpatient Glycemic Control Team Team Pager [phone] (8am-5pm) 11/07/2018 10:42 AM

## 2018-11-07 NOTE — ED Triage Notes (Addendum)
Pt reports vomiting for 3 days. Reports abdominal pain, chest pain, and throat pain. Pt has not seen doctor yet.

## 2018-11-07 NOTE — ED Provider Notes (Signed)
[name] Provider Note   CSN: [id number] Arrival date & time: 11/07/18  [id number]    History   Chief Complaint Chief Complaint  Patient presents with  . Emesis    HPI [name] [name] is a 48 y.o. male.     HPI  Pt was seen at 0720. Per pt, c/o gradual onset and persistence of multiple intermittent episodes of N/V/D that began 3 days ago.   Describes the stools as "brown" and "watery." Denies abd pain, no CP/SOB, no back pain, no fevers, no black or blood in stools or emesis, no cough.    Past Medical History:  Diagnosis Date  . Anxiety   . Arthritis   . CAD (coronary artery disease)    Moderate LAD disease 2016 - Dr. Jacinto HalimGanji  . Chronic bronchitis (HCC)   . Chronic upper back pain   . COPD (chronic obstructive pulmonary disease) (HCC)   . Depression   . Diabetic peripheral neuropathy (HCC)   . GERD (gastroesophageal reflux disease)   . History of gout   . Hyperlipemia   . Hypertension   . Migraine   . 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 Sleep apnea 2016  . Type 2 diabetes mellitus [facility](HCC)     Patient Active Problem List   Diagnosis Date Noted  . High anion gap metabolic acidosis 03/22/2018  . Epigastric abdominal pain 03/22/2018  . Nausea vomiting and diarrhea 03/22/2018  . AKI (acute kidney injury) (HCC) 03/22/2018  . Uncontrolled diabetes mellitus (HCC) 03/22/2018  . CAD (coronary artery disease) 02/15/2018  . Hyperlipidemia 02/15/2018  . Tobacco abuse 02/15/2018  . Diabetic gastroparesis (HCC) 06/02/2017  . ARF (acute renal failure) (HCC) 05/30/2017  . DKA (diabetic ketoacidoses) (HCC) 10/15/2016  . Gastroesophageal reflux disease 10/15/2016  . Anxiety with depression 10/15/2016  . HTN (hypertension) 10/15/2016  . Leukocytosis 11/25/2015  . Intractable nausea and vomiting 11/25/2015  . Dental abscess 11/25/2015  . Nausea and vomiting 11/25/2015  . Chest pain 03/27/2015    Past Surgical History:  Procedure Laterality Date  . ANKLE  SURGERY Right 1982   "had extra bones in there; took them out"  . APPENDECTOMY  1975  . CARDIAC CATHETERIZATION N/A 03/28/2015   Procedure: Left Heart Cath and Coronary Angiography;  Surgeon: Yates DecampJay Ganji, MD;  [facility];  Service: Cardiovascular;  Laterality: N/A;  . CARDIAC CATHETERIZATION N/A 03/28/2015   Procedure: Intravascular Pressure Wire/FFR Study;  Surgeon: Yates DecampJay Ganji, MD;  [facility];  Service: Cardiovascular;  Laterality: N/A;  . CARPAL TUNNEL RELEASE Left ~ 2008  . COLONOSCOPY WITH ESOPHAGOGASTRODUODENOSCOPY (EGD)    . ELBOW FRACTURE SURGERY Left ~ 2008  . FRACTURE SURGERY    . PILONIDAL CYST EXCISION N/A 03/24/2017   Procedure: EXCISION CHRONIC  PILONIDAL ABSCESS;  Surgeon: [name] MiyamotoBlackman, Douglas, MD;  Location: [facility] ORS;  Service: General;  Laterality: N/A;  . TENDON REPAIR Left ~ 2004   "main tendon in my ankle"        Home Medications    Prior to Admission medications   Medication Sig Start Date End Date Taking? Authorizing Provider  acetaminophen (TYLENOL) 325 MG tablet Take 2 tablets (650 mg total) by mouth every 6 (six) hours as needed for moderate pain. 03/25/18  Yes Elgergawy, Leana Roeawood S, MD  aspirin 81 MG tablet Take 81 mg by mouth at bedtime.    Yes [provider]  atorvastatin (LIPITOR) 80 MG tablet Take 1 tablet (80 mg  total) by mouth daily. 08/16/18  Yes Jacquelin HawkingMcElroy, Shannon, PA-C  buPROPion (WELLBUTRIN SR) 150 MG 12 hr tablet Take 300 mg by mouth daily.   Yes [provider]  famotidine (PEPCID) 40 MG tablet Take 1 tablet (40 mg total) by mouth 2 (two) times daily. 08/15/18  Yes Setzer, Terri L, NP  gabapentin (NEURONTIN) 300 MG capsule Take 1 capsule (300 mg total) by mouth 2 (two) times daily. 08/01/18  Yes Jacquelin HawkingMcElroy, Shannon, PA-C  hydrOXYzine (ATARAX/VISTARIL) 25 MG tablet Take 25 mg by mouth 3 (three) times daily as needed for anxiety.    Yes [provider]  insulin aspart (NOVOLOG) 100 UNIT/ML injection Before  each meal 3 times a day, 140-199 - 2 units, 200-250 - 4 units, 251-299 - 6 units,  300-349 - 8 units,  350 or above 10 units. Dispense syringes and needles as needed, Ok to switch to PEN if approved. Substitute to any brand approved. DX DM2, Code E11.65 03/19/18  Yes Leroy SeaSingh, Prashant K, MD  Insulin Glargine (LANTUS SOLOSTAR) 100 UNIT/ML Solostar Pen Inject 40 Units into the skin at bedtime. 08/01/18  Yes Jacquelin HawkingMcElroy, Shannon, PA-C  isosorbide mononitrate (IMDUR) 30 MG 24 hr tablet Take 1 tablet (30 mg total) by mouth daily. 06/14/18 11/07/18 Yes Jonelle SidleMcDowell, Samuel G, MD  metFORMIN (GLUCOPHAGE) 1000 MG tablet Take 1 tablet (1,000 mg total) by mouth 2 (two) times daily with a meal. 08/16/18  Yes Jacquelin HawkingMcElroy, Shannon, PA-C  metoprolol tartrate (LOPRESSOR) 25 MG tablet Take 25 mg by mouth 2 (two) times daily.    Yes [provider]  nitroGLYCERIN (NITROSTAT) 0.4 MG SL tablet Place 1 tablet (0.4 mg total) under the tongue every 5 (five) minutes as needed for chest pain. 10/05/17  Yes Jacquelin HawkingMcElroy, Shannon, PA-C  promethazine (PHENERGAN) 25 MG suppository Place 1 suppository (25 mg total) rectally every 6 (six) hours as needed for nausea. 03/19/18 03/19/19 Yes Leroy SeaSingh, Prashant K, MD  promethazine (PHENERGAN) 25 MG tablet Take 1 tablet (25 mg total) by mouth every 8 (eight) hours as needed for nausea or vomiting. 04/03/18  Yes Jacquelin HawkingMcElroy, Shannon, PA-C  PROZAC 40 MG capsule Take 40 mg by mouth every morning. 10/13/18  Yes [provider]    Family History Family History  Problem Relation Age of Onset  . Congestive Heart Failure Sister   . Diabetes Mother   . Hypertension Mother   . Diabetes Father   . Hypertension Father     Social History Social History   Tobacco Use  . Smoking status: Current Every Day Smoker    Packs/day: 0.50    Years: 34.00    Pack years: 17.00    Types: Cigarettes  . Smokeless tobacco: Never Used  Substance Use Topics  . Alcohol use: Never    Alcohol/week: 0.0 standard drinks     Frequency: Never  . Drug use: Never     Allergies   Omeprazole magnesium and Esomeprazole   Review of Systems Review of Systems ROS: Statement: All systems negative except as marked or noted in the HPI; Constitutional: Negative for fever and chills. ; ; Eyes: Negative for eye pain, redness and discharge. ; ; ENMT: Negative for ear pain, hoarseness, nasal congestion, sinus pressure and sore throat. ; ; Cardiovascular: Negative for chest pain, palpitations, diaphoresis, dyspnea and peripheral edema. ; ; Respiratory: Negative for cough, wheezing and stridor. ; ; Gastrointestinal: +N/V/D. Negative for abdominal pain, blood in stool, hematemesis, jaundice and rectal bleeding. . ; ; Genitourinary: Negative for dysuria, flank  pain and hematuria. ; ; Musculoskeletal: Negative for back pain and neck pain. Negative for swelling and trauma.; ; Skin: Negative for pruritus, rash, abrasions, blisters, bruising and skin lesion.; ; Neuro: Negative for headache, lightheadedness and neck stiffness. Negative for weakness, altered level of consciousness, altered mental status, extremity weakness, paresthesias, involuntary movement, seizure and syncope.       Physical Exam Updated Vital Signs BP (!) 214/133   Pulse (!) 101   Temp 98.1 F (36.7 C) (Oral)   Resp (!) 21   Ht 5\' 8"  (1.727 m)   Wt 77.1 kg   SpO2 96%   BMI 25.85 kg/m    Patient Vitals for the past 24 hrs:  BP Temp Temp src Pulse Resp SpO2 Height Weight  11/07/18 0900 (!) 214/133 - - (!) 101 - 96 % - -  11/07/18 0656 (!) 146/114 98.1 F (36.7 C) Oral (!) 21 (!) 21 99 % - -  11/07/18 [id number] - - - - - - 5\' 8"  (1.727 m) 77.1 kg     Physical Exam 0725: Physical examination:  Nursing notes reviewed; Vital signs and O2 SAT reviewed;  Constitutional: Well developed, Well nourished, In no acute distress; Head:  Normocephalic, atraumatic; Eyes: EOMI, PERRL, No scleral icterus; ENMT: Mouth and pharynx normal, Mucous membranes dry; Neck: Supple, Full  range of motion, No lymphadenopathy; Cardiovascular: Regular rate and rhythm, No gallop; Respiratory: Breath sounds clear & equal bilaterally, No wheezes.  Speaking full sentences with ease, Normal respiratory effort/excursion; Chest: Nontender, Movement normal; Abdomen: +vomiting during exam. Soft, Nontender, Nondistended, Normal bowel sounds; Genitourinary: No CVA tenderness; Extremities: Peripheral pulses normal, No tenderness, No edema, No calf edema or asymmetry.; Neuro: AA&Ox3, Major CN grossly intact.  Speech clear. No gross focal motor or sensory deficits in extremities. Climbs on and off stretcher easily by himself. Gait steady back to the stretcher from the bathroom after having BM..; Skin: Color normal, Warm, Dry.   ED Treatments / Results  Labs (all labs ordered are listed, but only abnormal results are displayed)   EKG None  Radiology   Procedures Procedures (including critical care time)  Medications Ordered in ED Medications  sodium chloride 0.9 % bolus 1,000 mL (has no administration in time range)  dextrose 5 %-0.45 % sodium chloride infusion (has no administration in time range)  insulin regular, human (MYXREDLIN) 100 units/ 100 mL infusion (has no administration in time range)  dextrose 50 % solution 25 mL (has no administration in time range)  0.9 %  sodium chloride infusion (has no administration in time range)  sodium chloride 0.9 % bolus 1,000 mL (1,000 mLs Intravenous New Bag/Given 11/07/18 0803)  promethazine (PHENERGAN) injection 12.5 mg (12.5 mg Intravenous Given 11/07/18 0827)  famotidine (PEPCID) IVPB 20 mg premix (0 mg Intravenous Stopped 11/07/18 0917)     Initial Impression / Assessment and Plan / ED Course  I have reviewed the triage vital signs and the nursing notes.  Pertinent labs & imaging results that were available during my care of the patient were reviewed by me and considered in my medical decision making (see chart for details).     MDM  Reviewed: previous chart, nursing note and vitals Reviewed previous: labs and ECG Interpretation: labs, ECG and x-ray Total time providing critical care: 30-74 minutes. This excludes time spent performing separately reportable procedures and services. Consults: admitting MD   CRITICAL CARE Performed by: Samuel JesterKathleen Kanylah Muench Total critical care time: 35 minutes Critical care time was exclusive of separately  billable procedures and treating other patients. Critical care was necessary to treat or prevent imminent or life-threatening deterioration. Critical care was time spent personally by me on the following activities: development of treatment plan with patient and/or surrogate as well as nursing, discussions with consultants, evaluation of patient's response to treatment, examination of patient, obtaining history from patient or surrogate, ordering and performing treatments and interventions, ordering and review of laboratory studies, ordering and review of radiographic studies, pulse oximetry and re-evaluation of patient's condition.   Results for orders placed or performed during the hospital encounter of 11/07/18  Comprehensive metabolic panel  Result Value Ref Range   Sodium 135 135 - 145 mmol/L   Potassium 4.6 3.5 - 5.1 mmol/L   Chloride 96 (L) 98 - 111 mmol/L   CO2 18 (L) 22 - 32 mmol/L   Glucose, Bld 529 (HH) 70 - 99 mg/dL   BUN 20 6 - 20 mg/dL   Creatinine, Ser 1.25 (H) 0.61 - 1.24 mg/dL   Calcium 9.4 8.9 - 10.3 mg/dL   Total Protein 8.3 (H) 6.5 - 8.1 g/dL   Albumin 4.5 3.5 - 5.0 g/dL   AST 18 15 - 41 U/L   ALT 20 0 - 44 U/L   Alkaline Phosphatase 112 38 - 126 U/L   Total Bilirubin 1.4 (H) 0.3 - 1.2 mg/dL   GFR calc non Af Amer >60 >60 mL/min   GFR calc Af Amer >60 >60 mL/min  Lipase, blood  Result Value Ref Range   Lipase 24 11 - 51 U/L  CBC with Differential  Result Value Ref Range   WBC 15.3 (H) 4.0 - 10.5 K/uL   RBC 5.37 4.22 - 5.81 MIL/uL   Hemoglobin 16.8 13.0 -  17.0 g/dL   HCT 48.8 39.0 - 52.0 %   MCV 90.9 80.0 - 100.0 fL   MCH 31.3 26.0 - 34.0 pg   MCHC 34.4 30.0 - 36.0 g/dL   RDW 11.9 11.5 - 15.5 %   Platelets 233 150 - 400 K/uL   nRBC 0.0 0.0 - 0.2 %   Neutrophils Relative % 79 %   Neutro Abs 12.4 (H) 1.7 - 7.7 K/uL   Lymphocytes Relative 14 %   Lymphs Abs 2.1 0.7 - 4.0 K/uL   Monocytes Relative 4 %   Monocytes Absolute 0.6 0.1 - 1.0 K/uL   Eosinophils Relative 1 %   Eosinophils Absolute 0.1 0.0 - 0.5 K/uL   Basophils Relative 1 %   Basophils Absolute 0.1 0.0 - 0.1 K/uL   Immature Granulocytes 1 %   Abs Immature Granulocytes 0.09 (H) 0.00 - 0.07 K/uL   Dg Abd Acute W/chest Result Date: 11/07/2018 CLINICAL DATA:  Chest pain EXAM: DG ABDOMEN ACUTE W/ 1V CHEST COMPARISON:  March 16, 2018 chest radiograph; abdomen series June 13, 2017 FINDINGS: PA chest: Lungs are clear. Heart size and pulmonary vascularity are normal. No adenopathy. Supine and upright abdomen: There is moderate stool throughout the colon. There is no appreciable bowel dilatation or air-fluid level to suggest bowel obstruction. No free air. There are scattered vascular calcifications in the pelvis. IMPRESSION: Moderate stool in colon. No bowel obstruction or free air. Lungs clear. Electronically Signed   By: Lowella Grip III M.D.   On: 11/07/2018 08:33     Eulice L Claire was evaluated in Emergency Department on 11/07/2018 for the symptoms described in the history of present illness. He was evaluated in the context of the global COVID-19 pandemic, which necessitated consideration  that the patient might be at risk for infection with the SARS-CoV-2 virus that causes COVID-19. Institutional protocols and algorithms that pertain to the evaluation of patients at risk for COVID-19 are in a state of rapid change based on information released by regulatory bodies including the CDC and federal and state organizations. These policies and algorithms were followed during the patient's  care in the ED.     0930:  BUN/Cr elevated from baseline. CBG elevated with AG 21; IVF NS boluses and IV insulin gtt started. Pt has not urinated despite IVF NX x2 boluses, appears clinically dehydrated with continued N/V/D while in the ED; will continue IVF and admit. Pharm Tech to verify pt's home meds; question compliance given N/V over the past 3 days.   T/C returned from [facility] Arbutus Leas, case discussed, including:  HPI, pertinent PM/SHx, VS/PE, dx testing, ED course and treatment:  Agreeable to admit.        Final Clinical Impressions(s) / ED Diagnoses   Final diagnoses:  Nausea vomiting and diarrhea  Intractable nausea and vomiting  Diabetic ketoacidosis without coma associated with other specified diabetes mellitus (HCC)  Hypertension, unspecified type    ED Discharge Orders    None       Samuel Jester, DO 11/10/18 2140

## 2018-11-07 NOTE — ED Notes (Signed)
Pt. Transported to xray 

## 2018-11-08 DIAGNOSIS — I2510 Atherosclerotic heart disease of native coronary artery without angina pectoris: Secondary | ICD-10-CM

## 2018-11-08 LAB — CBC
HCT: 41 % (ref 39.0–52.0)
Hemoglobin: 13.6 g/dL (ref 13.0–17.0)
MCH: 31.3 pg (ref 26.0–34.0)
MCHC: 33.2 g/dL (ref 30.0–36.0)
MCV: 94.3 fL (ref 80.0–100.0)
Platelets: 200 10*3/uL (ref 150–400)
RBC: 4.35 MIL/uL (ref 4.22–5.81)
RDW: 12.5 % (ref 11.5–15.5)
WBC: 17.7 10*3/uL — ABNORMAL HIGH (ref 4.0–10.5)
nRBC: 0 % (ref 0.0–0.2)

## 2018-11-08 LAB — BASIC METABOLIC PANEL
Anion gap: 11 (ref 5–15)
BUN: 16 mg/dL (ref 6–20)
CO2: 23 mmol/L (ref 22–32)
Calcium: 8.2 mg/dL — ABNORMAL LOW (ref 8.9–10.3)
Chloride: 106 mmol/L (ref 98–111)
Creatinine, Ser: 1.25 mg/dL — ABNORMAL HIGH (ref 0.61–1.24)
GFR calc Af Amer: >60 mL/min (ref >60)
GFR calc non Af Amer: >60 mL/min (ref >60)
Glucose, Bld: 151 mg/dL — ABNORMAL HIGH (ref 70–99)
Potassium: 4 mmol/L (ref 3.5–5.1)
Sodium: 140 mmol/L (ref 135–145)

## 2018-11-08 LAB — GLUCOSE, CAPILLARY
Glucose-Capillary: 142 mg/dL — ABNORMAL HIGH (ref 70–99)
Glucose-Capillary: 188 mg/dL — ABNORMAL HIGH (ref 70–99)
Glucose-Capillary: 221 mg/dL — ABNORMAL HIGH (ref 70–99)

## 2018-11-08 MED ORDER — LIVING WELL WITH DIABETES BOOK
Freq: Once | Status: AC
  Administered 2018-11-08: 16:00:00

## 2018-11-08 MED ORDER — INSULIN GLARGINE 100 UNIT/ML SOLOSTAR PEN: 40.0000 [IU] | PEN_INJECTOR | Freq: Every day | SUBCUTANEOUS | 3 refills | Status: DC

## 2018-11-08 MED ORDER — FAMOTIDINE 20 MG PO TABS
20.0000 mg | ORAL_TABLET | Freq: Two times a day (BID) | ORAL | Status: DC
  Administered 2018-11-08: 20 mg via ORAL
  Filled 2018-11-08: qty 1

## 2018-11-08 NOTE — Progress Notes (Signed)
Discharge instructions reviewed with patient. Patient verbalized understanding of instructions. Patient discharged home with family in stable condition   

## 2018-11-08 NOTE — Care Management (Signed)
Follow up appt scheduled and added to AVS. Patient follows at [facility]. He has been getting samples of Novolog from clinic and has filled out paperwork for financial assistance. RN at [facility] states he has a shipment coming in this week.

## 2018-11-08 NOTE — Discharge Summary (Signed)
Physician Discharge Summary  [name] PettyDanny L [name] GNF:[id number] DOB: Jun 30, 1970 DOA: 11/07/2018  PCP: Jacquelin HawkingMcElroy, Shannon, PA-C  Admit date: 11/07/2018 Discharge date: 11/08/2018  Time spent: 35 minutes  Recommendations for Outpatient Follow-up:  1. Repeat basic metabolic panel to follow electrolytes and renal function 2. Reassess patient's A1c and continue close follow-up to his CBGs, further adjustment to hypoglycemic regimen to be done as needed.   Discharge Diagnoses:  Active Problems:   Intractable nausea and vomiting   Diabetic gastroparesis (HCC)   Hyperlipidemia   Tobacco abuse   DKA, type 2 (HCC) Coronary artery disease Essential hypertension  Discharge Condition: Stable and improved.  Patient discharged home with instructions to follow-up with PCP in 10 days.  Diet recommendation: Heart healthy modified carbohydrate diet.  Filed Weights   11/07/18 0647 11/08/18 0500  Weight: 77.1 kg 77.8 kg    History of present illness:  As per H&P written by Dr. Onalee Huaavid Tat on 11/07/2018  48 y.o. male with medical history of uncontrolled diabetes mellitus type 2, Barrett's esophagus, coronary artery disease, hyperlipidemia, tobacco abuse, noncompliance presenting with 3-day history of nausea, vomiting, diarrhea, abdominal pain.  The patient states that for the past 2 days he has had 3-4 loose bowel movements without hematochezia or melena.  He has been taking ibuprofen for the last 3 days for his abdominal pain without much relief.  Because of continued vomiting and abdominal pain, the patient states that he has not taken his insulin release for the past 2 days.  Upon further questioning, the patient states that he is only been taking his insulin approximately 3 to 4 days/week.  He states that he does not have a PCP, but frequently goes to the urgent care for his refills.  He denies any recent antibiotics or other over-the-counter medications or new medications.  He denies any fevers, chills, chest  pain, sore throat, coughing, hemoptysis, hematemesis. He denies any recent sick contacts.  In the emergency department, the patient was afebrile hemodynamically stable saturating 99% room air.  He had hypertensive urgency with a blood pressure of 213/33.  The patient was started IV fluids and insulin drip.  He had a serum glucose of 529 with anion gap of 21.  Acute abdominal series was negative.  WBC is 15.3.  Lipase was 24.  Hospital Course:  1-DKA type II: Without coma. -In the setting of uncontrolled diabetes mellitus and poor medication compliance. -Patient responded significantly well with a drastic turn-around after fluid resuscitation and IV insulin therapy. -DKA process was resolved and the patient transition to sliding scale insulin and Lantus. -Patient was educated about importance of medication compliance and modified carbohydrate diet. -A1c 12.8 -Will need close follow-up of patient's CBGs with further adjustment to hypoglycemic regimen as needed. -At this time he is stable for discharge.  2-nausea, vomiting/diarrhea and abdominal pain. -No further episode of loose stools/diarrhea since admission. -Patient denies any further episode of nausea or vomiting after resumption of his DKA process. -Patient instructed to keep himself well-hydrated -Continue the use of Pepcid and as needed antiemetics.  3-coronary artery disease -No chest pain or shortness of breath -Continue aspirin, statins and beta-blocker -Low risk Myoview per record review in August 2019.  4-hypertension -Resume home antihypertensive regimen -Patient educated about importance of medication compliance -Advised to follow heart healthy diet.  5-hyperlipidemia -Continue statins.  6-tobacco abuse -Extensive cessation counseling provided on 11/07/2018 by Dr. Arbutus Leasat on admission. -Further education and quitting tips provided at time of discharge -Patient declined the use  of nicotine patch.  7-depression -Continue  the use of Prozac -No suicidal ideation or hallucinations.  Procedures:  See below for x-ray reports.  Consultations:  None  Discharge Exam: Vitals:   11/08/18 1000 11/08/18 1104  BP: 113/67   Pulse: 75 75  Resp: (!) 21 (!) 23  Temp:  98.8 F (37.1 C)  SpO2: 96% 95%    General: Afebrile, no chest pain, no shortness of breath, no nausea, no vomiting.  Tolerating diet and eager to be discharged. Cardiovascular: S1 and S2, no rubs, no gallops, no murmurs. Respiratory: Good air movement bilaterally, no wheezing, no crackles; normal respiratory effort Abdomen: Soft, nontender, nondistended, positive bowel sounds. Extremities: No edema, no cyanosis, no clubbing.  Discharge Instructions   Discharge Instructions    Diet - low sodium heart healthy   Complete by:  As directed    Diet Carb Modified   Complete by:  As directed    Discharge instructions   Complete by:  As directed    Take medications as prescribed Follow modified carbohydrate diet Keep yourself well-hydrated Arrange follow-up with PCP in 10 days.     Allergies as of 11/08/2018      Reactions   Omeprazole Magnesium Swelling   Face swells, no breathing impairment   Esomeprazole Swelling   Face swells, no breathing impairment      Medication List    TAKE these medications   acetaminophen 325 MG tablet Commonly known as:  TYLENOL Take 2 tablets (650 mg total) by mouth every 6 (six) hours as needed for moderate pain.   aspirin 81 MG tablet Take 81 mg by mouth at bedtime.   atorvastatin 80 MG tablet Commonly known as:  LIPITOR Take 1 tablet (80 mg total) by mouth daily.   buPROPion 150 MG 12 hr tablet Commonly known as:  WELLBUTRIN SR Take 300 mg by mouth daily.   famotidine 40 MG tablet Commonly known as:  Pepcid Take 1 tablet (40 mg total) by mouth 2 (two) times daily.   gabapentin 300 MG capsule Commonly known as:  NEURONTIN Take 1 capsule (300 mg total) by mouth 2 (two) times daily.    hydrOXYzine 25 MG tablet Commonly known as:  ATARAX/VISTARIL Take 25 mg by mouth 3 (three) times daily as needed for anxiety.   insulin aspart 100 UNIT/ML injection Commonly known as:  NovoLOG Before each meal 3 times a day, 140-199 - 2 units, 200-250 - 4 units, 251-299 - 6 units,  300-349 - 8 units,  350 or above 10 units. Dispense syringes and needles as needed, Ok to switch to PEN if approved. Substitute to any brand approved. DX DM2, Code E11.65   Insulin Glargine 100 UNIT/ML Solostar Pen Commonly known as:  Lantus SoloStar Inject 40 Units into the skin at bedtime.   isosorbide mononitrate 30 MG 24 hr tablet Commonly known as:  IMDUR Take 1 tablet (30 mg total) by mouth daily.   metFORMIN 1000 MG tablet Commonly known as:  GLUCOPHAGE Take 1 tablet (1,000 mg total) by mouth 2 (two) times daily with a meal.   metoprolol tartrate 25 MG tablet Commonly known as:  LOPRESSOR Take 25 mg by mouth 2 (two) times daily.   nitroGLYCERIN 0.4 MG SL tablet Commonly known as:  NITROSTAT Place 1 tablet (0.4 mg total) under the tongue every 5 (five) minutes as needed for chest pain.   promethazine 25 MG suppository Commonly known as:  Phenergan Place 1 suppository (25 mg total) rectally every 6 (  six) hours as needed for nausea.   promethazine 25 MG tablet Commonly known as:  PHENERGAN Take 1 tablet (25 mg total) by mouth every 8 (eight) hours as needed for nausea or vomiting.   PROzac 40 MG capsule Generic drug:  FLUoxetine Take 40 mg by mouth every morning.      Allergies  Allergen Reactions  . Omeprazole Magnesium Swelling    Face swells, no breathing impairment  . Esomeprazole Swelling    Face swells, no breathing impairment   Follow-up Information    Soyla Dryer, PA-C Follow up.   Specialty:  Physician Assistant Why:  follow up appt June 15th at 10:30 Contact information: [address] [place] [address] [phone]            The results of  significant diagnostics from this hospitalization (including imaging, microbiology, ancillary and laboratory) are listed below for reference.    Significant Diagnostic Studies: Ct Abdomen Pelvis Wo Contrast  Result Date: 11/07/2018 CLINICAL DATA:  Inpatient. Diffuse abdominal pain with vomiting and loose stools for 3 days. EXAM: CT ABDOMEN AND PELVIS WITHOUT CONTRAST TECHNIQUE: Multidetector CT imaging of the abdomen and pelvis was performed following the standard protocol without IV contrast. COMPARISON:  11/27/2015 CT abdomen/pelvis. Abdominal radiographs from earlier today. FINDINGS: Lower chest: No significant pulmonary nodules or acute consolidative airspace disease. Mild circumferential wall thickening in the lower thoracic esophagus, slightly increased from prior CT. Hepatobiliary: Normal liver size. No liver mass. Normal gallbladder with no radiopaque cholelithiasis. No biliary ductal dilatation. Pancreas: Normal, with no mass or duct dilation. Spleen: Normal size. No mass. Adrenals/Urinary Tract: Normal adrenals. No renal stones. No hydronephrosis. No contour deforming renal masses. Normal caliber ureters. No ureteral stones. Nonspecific diffuse bladder wall thickening, increased slightly from prior. Stomach/Bowel: Normal non-distended stomach. No small bowel wall thickening. A few minimally dilated proximal small bowel loops in the anterior left abdomen measuring up to 3.1 cm diameter. No discrete small bowel caliber transition. No pneumatosis. Oral contrast transits to mid small bowel. Appendectomy. Relatively collapsed large bowel with mild colonic stool. No definite large bowel wall thickening or significant diverticulosis. No significant pericolonic fat stranding. Vascular/Lymphatic: Atherosclerotic nonaneurysmal abdominal aorta. No pathologically enlarged lymph nodes in the abdomen or pelvis. Reproductive: Top-normal size prostate. Other: No pneumoperitoneum, ascites or focal fluid collection.  Musculoskeletal: No aggressive appearing focal osseous lesions. Mild thoracolumbar spondylosis. IMPRESSION: 1. Minimally dilated proximal small bowel loops in the left abdomen, without small bowel wall thickening or focal small bowel caliber transition. Findings favor a mild adynamic ileus such as due to enteritis. Low-grade mid small-bowel obstruction considered less likely. 2. Chronic diffuse bladder wall thickening, nonspecific, mildly worsened from prior. Correlate with urinalysis as clinically warranted. No hydronephrosis. 3. Mild circumferential wall thickening in the lower thoracic esophagus, nonspecific, mildly worsened from prior, most commonly due to reflux esophagitis, with Barrett's esophagus or neoplasm not excluded. Upper endoscopic correlation may be obtained as clinically warranted. 4.  Aortic Atherosclerosis (ICD10-I70.0). Electronically Signed   By: Ilona Sorrel M.D.   On: 11/07/2018 17:57   Dg Abd Acute W/chest  Result Date: 11/07/2018 CLINICAL DATA:  Chest pain EXAM: DG ABDOMEN ACUTE W/ 1V CHEST COMPARISON:  March 16, 2018 chest radiograph; abdomen series June 13, 2017 FINDINGS: PA chest: Lungs are clear. Heart size and pulmonary vascularity are normal. No adenopathy. Supine and upright abdomen: There is moderate stool throughout the colon. There is no appreciable bowel dilatation or air-fluid level to suggest bowel obstruction. No free air.  There are scattered vascular calcifications in the pelvis. IMPRESSION: Moderate stool in colon. No bowel obstruction or free air. Lungs clear. Electronically Signed   By: Bretta BangWilliam  Woodruff III M.D.   On: 11/07/2018 08:33    Microbiology: Recent Results (from the past 240 hour(s))  SARS Coronavirus 2 (CEPHEID - Performed [facility]), Hosp Order     Status: None   Collection Time: 11/07/18  9:22 AM  Result Value Ref Range Status   SARS Coronavirus 2 NEGATIVE NEGATIVE Final    Comment: (NOTE) If result is NEGATIVE SARS-CoV-2  target nucleic acids are NOT DETECTED. The SARS-CoV-2 RNA is generally detectable in upper and lower  respiratory specimens during the acute phase of infection. The lowest  concentration of SARS-CoV-2 viral copies this assay can detect is 250  copies / mL. A negative result does not preclude SARS-CoV-2 infection  and should not be used as the sole basis for treatment or other  patient management decisions.  A negative result may occur with  improper specimen collection / handling, submission of specimen other  than nasopharyngeal swab, presence of viral mutation(s) within the  areas targeted by this assay, and inadequate number of viral copies  (<250 copies / mL). A negative result must be combined with clinical  observations, patient history, and epidemiological information. If result is POSITIVE SARS-CoV-2 target nucleic acids are DETECTED. The SARS-CoV-2 RNA is generally detectable in upper and lower  respiratory specimens dur ing the acute phase of infection.  Positive  results are indicative of active infection with SARS-CoV-2.  Clinical  correlation with patient history and other diagnostic information is  necessary to determine patient infection status.  Positive results do  not rule out bacterial infection or co-infection with other viruses. If result is PRESUMPTIVE POSTIVE SARS-CoV-2 nucleic acids MAY BE PRESENT.   A presumptive positive result was obtained on the submitted specimen  and confirmed on repeat testing.  While 2019 novel coronavirus  (SARS-CoV-2) nucleic acids may be present in the submitted sample  additional confirmatory testing may be necessary for epidemiological  and / or clinical management purposes  to differentiate between  SARS-CoV-2 and other Sarbecovirus currently known to infect humans.  If clinically indicated additional testing with an alternate test  methodology [phone] is advised. The SARS-CoV-2 RNA is generally  detectable in upper and lower  respiratory sp ecimens during the acute  phase of infection. The expected result is Negative. Fact Sheet for Patients:  BoilerBrush.com.cyhttps://www.fda.gov/media/136312/download Fact Sheet for Healthcare Providers: https://pope.com/https://www.fda.gov/media/136313/download This test is not yet approved or cleared by the [place] FDA and has been authorized for detection and/or diagnosis of SARS-CoV-2 by FDA under an Emergency Use Authorization (EUA).  This EUA will remain in effect (meaning this test can be used) for the duration of the COVID-19 declaration under Section 564(b)(1) of the Act, 21 U.S.C. section 360bbb-3(b)(1), unless the authorization is terminated or revoked sooner. Performed at [facility], [address] [place], [place] [address]   MRSA PCR Screening     Status: None   Collection Time: 11/07/18 11:40 AM  Result Value Ref Range Status   MRSA by PCR NEGATIVE NEGATIVE Final    Comment:        The GeneXpert MRSA Assay (FDA approved for NASAL specimens only), is one component of a comprehensive MRSA colonization surveillance program. It is not intended to diagnose MRSA infection nor to guide or monitor treatment for MRSA infections. Performed at [facility], [address]  [place], [place] [address]      Labs: Basic Metabolic Panel: Recent Labs  Lab 11/07/18 0745 11/07/18 1401 11/07/18 1738 11/07/18 2211 11/08/18 0410  NA 135 139 141 139 140  K 4.6 4.2 4.1 4.0 4.0  CL 96* 104 106 105 106  CO2 18* 21* 23 25 23   GLUCOSE 529* 231* 186* 112* 151*  BUN 20 15 14 14 16   CREATININE 1.25* 1.07 1.24 1.11 1.25*  CALCIUM 9.4 8.6* 8.5* 8.3* 8.2*   Liver Function Tests: Recent Labs  Lab 11/07/18 0745  AST 18  ALT 20  ALKPHOS 112  BILITOT 1.4*  PROT 8.3*  ALBUMIN 4.5   Recent Labs  Lab 11/07/18 0745  LIPASE 24   CBC: Recent Labs  Lab 11/07/18 0745 11/08/18 0410  WBC 15.3* 17.7*  NEUTROABS 12.4*  --   HGB 16.8 13.6  HCT 48.8 41.0  MCV 90.9 94.3  PLT 233 200    CBG: Recent Labs  Lab 11/07/18 2216 11/07/18 2311 11/08/18 0012 11/08/18 0726 11/08/18 1103  GLUCAP 107* 124* 142* 188* 221*    Signed:  Vassie Lollarlos Tyja Gortney MD.  [facility] 11/08/2018, 4:00 PM

## 2018-11-09 ENCOUNTER — Other Ambulatory Visit: Payer: Self-pay

## 2018-11-09 ENCOUNTER — Encounter (HOSPITAL_COMMUNITY): Payer: Self-pay | Admitting: Emergency Medicine

## 2018-11-09 ENCOUNTER — Emergency Department (HOSPITAL_COMMUNITY)
Admission: EM | Admit: 2018-11-09 | Discharge: 2018-11-09 | Disposition: A | Discharge status: Home / Self Care | Payer: Medicaid Other | Attending: Emergency Medicine | Admitting: Emergency Medicine

## 2018-11-09 DIAGNOSIS — Z7982 Long term (current) use of aspirin: Secondary | ICD-10-CM | POA: Insufficient documentation

## 2018-11-09 DIAGNOSIS — F17210 Nicotine dependence, cigarettes, uncomplicated: Secondary | ICD-10-CM | POA: Insufficient documentation

## 2018-11-09 DIAGNOSIS — R112 Nausea with vomiting, unspecified: Secondary | ICD-10-CM | POA: Insufficient documentation

## 2018-11-09 DIAGNOSIS — I10 Essential (primary) hypertension: Secondary | ICD-10-CM | POA: Insufficient documentation

## 2018-11-09 DIAGNOSIS — I2510 Atherosclerotic heart disease of native coronary artery without angina pectoris: Secondary | ICD-10-CM | POA: Insufficient documentation

## 2018-11-09 DIAGNOSIS — Z794 Long term (current) use of insulin: Secondary | ICD-10-CM | POA: Insufficient documentation

## 2018-11-09 DIAGNOSIS — E1143 Type 2 diabetes mellitus with diabetic autonomic (poly)neuropathy: Secondary | ICD-10-CM | POA: Insufficient documentation

## 2018-11-09 DIAGNOSIS — J449 Chronic obstructive pulmonary disease, unspecified: Secondary | ICD-10-CM | POA: Insufficient documentation

## 2018-11-09 DIAGNOSIS — Z79899 Other long term (current) drug therapy: Secondary | ICD-10-CM | POA: Insufficient documentation

## 2018-11-09 LAB — URINALYSIS, ROUTINE W REFLEX MICROSCOPIC
Bacteria, UA: NONE SEEN
Bilirubin Urine: NEGATIVE
Glucose, UA: >=500 mg/dL — AB
Ketones, ur: NEGATIVE mg/dL
Leukocytes,Ua: NEGATIVE
Nitrite: NEGATIVE
Protein, ur: 100 mg/dL — AB
Specific Gravity, Urine: 1.011 (ref 1.005–1.030)
pH: 8 (ref 5.0–8.0)

## 2018-11-09 LAB — CBC
HCT: 47.3 % (ref 39.0–52.0)
Hemoglobin: 16.5 g/dL (ref 13.0–17.0)
MCH: 31.4 pg (ref 26.0–34.0)
MCHC: 34.9 g/dL (ref 30.0–36.0)
MCV: 89.9 fL (ref 80.0–100.0)
Platelets: 262 10*3/uL (ref 150–400)
RBC: 5.26 MIL/uL (ref 4.22–5.81)
RDW: 12 % (ref 11.5–15.5)
WBC: 14.7 10*3/uL — ABNORMAL HIGH (ref 4.0–10.5)
nRBC: 0 % (ref 0.0–0.2)

## 2018-11-09 LAB — COMPREHENSIVE METABOLIC PANEL
ALT: 17 U/L (ref 0–44)
AST: 16 U/L (ref 15–41)
Albumin: 4.3 g/dL (ref 3.5–5.0)
Alkaline Phosphatase: 106 U/L (ref 38–126)
Anion gap: 12 (ref 5–15)
BUN: 11 mg/dL (ref 6–20)
CO2: 23 mmol/L (ref 22–32)
Calcium: 9.6 mg/dL (ref 8.9–10.3)
Chloride: 104 mmol/L (ref 98–111)
Creatinine, Ser: 0.97 mg/dL (ref 0.61–1.24)
GFR calc Af Amer: >60 mL/min (ref >60)
GFR calc non Af Amer: >60 mL/min (ref >60)
Glucose, Bld: 247 mg/dL — ABNORMAL HIGH (ref 70–99)
Potassium: 3.8 mmol/L (ref 3.5–5.1)
Sodium: 139 mmol/L (ref 135–145)
Total Bilirubin: 1 mg/dL (ref 0.3–1.2)
Total Protein: 8 g/dL (ref 6.5–8.1)

## 2018-11-09 LAB — CBG MONITORING, ED: Glucose-Capillary: 213 mg/dL — ABNORMAL HIGH (ref 70–99)

## 2018-11-09 LAB — LIPASE, BLOOD: Lipase: 23 U/L (ref 11–51)

## 2018-11-09 MED ORDER — SODIUM CHLORIDE 0.9 % IV BOLUS
1000.0000 mL | Freq: Once | INTRAVENOUS | Status: AC
  Administered 2018-11-09: 1000 mL via INTRAVENOUS

## 2018-11-09 MED ORDER — PROMETHAZINE HCL 25 MG RE SUPP: 25.0000 mg | Freq: Four times a day (QID) | RECTAL | 0 refills | Status: DC | PRN

## 2018-11-09 MED ORDER — ALUM & MAG HYDROXIDE-SIMETH 200-200-20 MG/5ML PO SUSP
30.0000 mL | Freq: Once | ORAL | Status: AC
  Administered 2018-11-09: 30 mL via ORAL
  Filled 2018-11-09: qty 30

## 2018-11-09 MED ORDER — PROMETHAZINE HCL 25 MG RE SUPP
25.0000 mg | Freq: Once | RECTAL | Status: AC
  Administered 2018-11-09: 25 mg via RECTAL
  Filled 2018-11-09: qty 1

## 2018-11-09 NOTE — Discharge Instructions (Addendum)
Drink plenty of fluids.  Follow up with your md next week °

## 2018-11-09 NOTE — ED Triage Notes (Signed)
Pt seen here recently and discharged. Back to day with same c/o vomiting. States x 4 days.

## 2018-11-09 NOTE — ED Notes (Signed)
Pt was informed we need urine.  

## 2018-11-09 NOTE — ED Provider Notes (Signed)
[facility] Provider Note   CSN: [id number] Arrival date & time: 11/09/18  1853     History   Chief Complaint Chief Complaint  Patient presents with  . Emesis    HPI [name] [name] is a 48 y.o. male.     Patient has gastroparesis and diabetes.  Patient was admitted to the hospital and discharged yesterday but he says he was not given nausea medicine.  Patient presents with vomiting today  The history is provided by the patient. No language interpreter was used.  Emesis Severity:  Mild Quality:  Undigested food Able to tolerate:  Liquids Progression:  Partially resolved Chronicity:  New Recent urination:  Normal Context: not post-tussive   Associated symptoms: no abdominal pain, no cough, no diarrhea and no headaches     Past Medical History:  Diagnosis Date  . Anxiety   . Arthritis   . CAD (coronary artery disease)    Moderate LAD disease 2016 - Dr. Jacinto HalimGanji  . Chronic bronchitis (HCC)   . Chronic upper back pain   . COPD (chronic obstructive pulmonary disease) (HCC)   . Depression   . Diabetic peripheral neuropathy (HCC)   . GERD (gastroesophageal reflux disease)   . History of gout   . Hyperlipemia   . Hypertension   . Migraine   . 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 Sleep apnea 2016  . Type 2 diabetes mellitus [facility](HCC)     Patient Active Problem List   Diagnosis Date Noted  . DKA, type 2 (HCC) 11/07/2018  . High anion gap metabolic acidosis 03/22/2018  . Epigastric abdominal pain 03/22/2018  . Nausea vomiting and diarrhea 03/22/2018  . AKI (acute kidney injury) (HCC) 03/22/2018  . Uncontrolled diabetes mellitus (HCC) 03/22/2018  . CAD (coronary artery disease) 02/15/2018  . Hyperlipidemia 02/15/2018  . Tobacco abuse 02/15/2018  . Diabetic gastroparesis (HCC) 06/02/2017  . ARF (acute renal failure) (HCC) 05/30/2017  . DKA (diabetic ketoacidoses) (HCC) 10/15/2016  . Gastroesophageal reflux disease 10/15/2016  . Anxiety with  depression 10/15/2016  . HTN (hypertension) 10/15/2016  . Leukocytosis 11/25/2015  . Intractable nausea and vomiting 11/25/2015  . Dental abscess 11/25/2015  . Nausea and vomiting 11/25/2015  . Chest pain 03/27/2015    Past Surgical History:  Procedure Laterality Date  . ANKLE SURGERY Right 1982   "had extra bones in there; took them out"  . APPENDECTOMY  1975  . CARDIAC CATHETERIZATION N/A 03/28/2015   Procedure: Left Heart Cath and Coronary Angiography;  Surgeon: Yates DecampJay Ganji, MD;  [facility];  Service: Cardiovascular;  Laterality: N/A;  . CARDIAC CATHETERIZATION N/A 03/28/2015   Procedure: Intravascular Pressure Wire/FFR Study;  Surgeon: Yates DecampJay Ganji, MD;  [facility];  Service: Cardiovascular;  Laterality: N/A;  . CARPAL TUNNEL RELEASE Left ~ 2008  . COLONOSCOPY WITH ESOPHAGOGASTRODUODENOSCOPY (EGD)    . ELBOW FRACTURE SURGERY Left ~ 2008  . FRACTURE SURGERY    . PILONIDAL CYST EXCISION N/A 03/24/2017   Procedure: EXCISION CHRONIC  PILONIDAL ABSCESS;  Surgeon: [name] MiyamotoBlackman, Douglas, MD;  Location: [facility] ORS;  Service: General;  Laterality: N/A;  . TENDON REPAIR Left ~ 2004   "main tendon in my ankle"        Home Medications    Prior to Admission medications   Medication Sig Start Date End Date Taking? Authorizing Provider  acetaminophen (TYLENOL) 325 MG tablet Take 2 tablets (650 mg total) by mouth every 6 (six) hours as needed  for moderate pain. 03/25/18  Yes Elgergawy, Silver Huguenin, MD  aspirin 81 MG tablet Take 81 mg by mouth at bedtime.    Yes [provider]  atorvastatin (LIPITOR) 80 MG tablet Take 1 tablet (80 mg total) by mouth daily. 08/16/18  Yes Soyla Dryer, PA-C  buPROPion (WELLBUTRIN SR) 150 MG 12 hr tablet Take 300 mg by mouth daily.   Yes [provider]  famotidine (PEPCID) 40 MG tablet Take 1 tablet (40 mg total) by mouth 2 (two) times daily. 08/15/18  Yes Setzer, Terri L, NP  gabapentin (NEURONTIN) 300 MG capsule  Take 1 capsule (300 mg total) by mouth 2 (two) times daily. 08/01/18  Yes Soyla Dryer, PA-C  hydrOXYzine (ATARAX/VISTARIL) 25 MG tablet Take 25 mg by mouth 3 (three) times daily as needed for anxiety.    Yes [provider]  insulin aspart (NOVOLOG) 100 UNIT/ML injection Before each meal 3 times a day, 140-199 - 2 units, 200-250 - 4 units, 251-299 - 6 units,  300-349 - 8 units,  350 or above 10 units. Dispense syringes and needles as needed, Ok to switch to PEN if approved. Substitute to any brand approved. DX DM2, Code E11.65 03/19/18  Yes Thurnell Lose, MD  Insulin Glargine (LANTUS SOLOSTAR) 100 UNIT/ML Solostar Pen Inject 40 Units into the skin at bedtime. 11/08/18  Yes Barton Dubois, MD  metFORMIN (GLUCOPHAGE) 1000 MG tablet Take 1 tablet (1,000 mg total) by mouth 2 (two) times daily with a meal. 08/16/18  Yes Soyla Dryer, PA-C  metoprolol tartrate (LOPRESSOR) 25 MG tablet Take 25 mg by mouth 2 (two) times daily.    Yes [provider]  nitroGLYCERIN (NITROSTAT) 0.4 MG SL tablet Place 1 tablet (0.4 mg total) under the tongue every 5 (five) minutes as needed for chest pain. 10/05/17  Yes Soyla Dryer, PA-C  PROZAC 40 MG capsule Take 40 mg by mouth every morning. 10/13/18  Yes [provider]  isosorbide mononitrate (IMDUR) 30 MG 24 hr tablet Take 1 tablet (30 mg total) by mouth daily. 06/14/18 11/07/18  Satira Sark, MD  promethazine (PHENERGAN) 25 MG suppository Place 1 suppository (25 mg total) rectally every 6 (six) hours as needed for nausea or vomiting. 11/09/18   [name], MD    Family History Family History  Problem Relation Age of Onset  . Congestive Heart Failure Sister   . Diabetes Mother   . Hypertension Mother   . Diabetes Father   . Hypertension Father     Social History Social History   Tobacco Use  . Smoking status: Current Every Day Smoker    Packs/day: 0.50    Years: 34.00    Pack years: 17.00    Types: Cigarettes  .  Smokeless tobacco: Never Used  Substance Use Topics  . Alcohol use: Never    Alcohol/week: 0.0 standard drinks    Frequency: Never  . Drug use: Never     Allergies   Omeprazole magnesium and Esomeprazole   Review of Systems Review of Systems  Constitutional: Negative for appetite change and fatigue.  HENT: Negative for congestion, ear discharge and sinus pressure.   Eyes: Negative for discharge.  Respiratory: Negative for cough.   Cardiovascular: Negative for chest pain.  Gastrointestinal: Positive for vomiting. Negative for abdominal pain and diarrhea.  Genitourinary: Negative for frequency and hematuria.  Musculoskeletal: Negative for back pain.  Skin: Negative for rash.  Neurological: Negative for seizures and headaches.  Psychiatric/Behavioral: Negative for hallucinations.  Physical Exam Updated Vital Signs BP (!) 207/111   Pulse 95   Temp 98.6 F (37 C) (Oral)   Resp 18   Ht 5\' 8"  (1.727 m)   Wt 77.1 kg   SpO2 100%   BMI 25.85 kg/m   Physical Exam Vitals signs and nursing note reviewed.  Constitutional:      Appearance: He is well-developed.  HENT:     Head: Normocephalic.  Eyes:     General: No scleral icterus.    Conjunctiva/sclera: Conjunctivae normal.  Neck:     Musculoskeletal: Neck supple.     Thyroid: No thyromegaly.  Cardiovascular:     Rate and Rhythm: Normal rate and regular rhythm.     Heart sounds: No murmur. No friction rub. No gallop.   Pulmonary:     Breath sounds: No stridor. No wheezing or rales.  Chest:     Chest wall: No tenderness.  Abdominal:     General: There is no distension.     Tenderness: There is no abdominal tenderness. There is no rebound.  Musculoskeletal: Normal range of motion.  Lymphadenopathy:     Cervical: No cervical adenopathy.  Skin:    Findings: No erythema or rash.  Neurological:     Mental Status: He is oriented to person, place, and time.     Motor: No abnormal muscle tone.     Coordination:  Coordination normal.  Psychiatric:        Behavior: Behavior normal.      ED Treatments / Results  Labs (all labs ordered are listed, but only abnormal results are displayed) Labs Reviewed  COMPREHENSIVE METABOLIC PANEL - Abnormal; Notable for the following components:      Result Value   Glucose, Bld 247 (*)    All other components within normal limits  CBC - Abnormal; Notable for the following components:   WBC 14.7 (*)    All other components within normal limits  URINALYSIS, ROUTINE W REFLEX MICROSCOPIC - Abnormal; Notable for the following components:   Color, Urine STRAW (*)    Glucose, UA >=500 (*)    Hgb urine dipstick SMALL (*)    Protein, ur 100 (*)    All other components within normal limits  CBG MONITORING, ED - Abnormal; Notable for the following components:   Glucose-Capillary 213 (*)    All other components within normal limits  LIPASE, BLOOD    EKG    Radiology No results found.  Procedures Procedures (including critical care time)  Medications Ordered in ED Medications  sodium chloride 0.9 % bolus 1,000 mL (0 mLs Intravenous Stopped 11/09/18 2156)  promethazine (PHENERGAN) suppository 25 mg (25 mg Rectal Given 11/09/18 2015)  alum & mag hydroxide-simeth (MAALOX/MYLANTA) 200-200-20 MG/5ML suspension 30 mL (30 mLs Oral Given 11/09/18 2107)     Initial Impression / Assessment and Plan / ED Course  I have reviewed the triage vital signs and the nursing notes.  Pertinent labs & imaging results that were available during my care of the patient were reviewed by me and considered in my medical decision making (see chart for details).        Patient with vomiting and mild dehydration and elevated glucose.  Patient improved with fluids and nausea medicine.  He will be sent home with a prescription for Phenergan suppositories and follow-up with PCP  Final Clinical Impressions(s) / ED Diagnoses   Final diagnoses:  Non-intractable vomiting with nausea,  unspecified vomiting type    ED Discharge Orders  Ordered    promethazine (PHENERGAN) 25 MG suppository  Every 6 hours PRN     11/09/18 2157           Bethann BerkshireZammit, Libra Gatz, MD 11/09/18 2201

## 2018-11-13 ENCOUNTER — Ambulatory Visit: Payer: Medicaid Other | Admitting: Physician Assistant

## 2018-11-13 DIAGNOSIS — E1165 Type 2 diabetes mellitus with hyperglycemia: Secondary | ICD-10-CM

## 2018-11-13 DIAGNOSIS — R112 Nausea with vomiting, unspecified: Secondary | ICD-10-CM

## 2018-11-13 NOTE — Progress Notes (Signed)
There were no vitals taken for this visit.   Subjective:    Patient ID: [name] [name], male    DOB: August 12, 1970, 48 y.o.   MRN: [id number]  HPI: [name] is a 48 y.o. male presenting on 11/13/2018 for No chief complaint on file.   HPI    This is a telemedicine visit due to coronavirus pandemic.  It is via Telephone as pt does not have a mobile phone with video capabilities   I connected with  [name] [name] on 11/13/18 by a video enabled telemedicine application and verified that I am speaking with the correct person using two identifiers.   I discussed the limitations of evaluation and management by telemedicine. The patient expressed understanding and agreed to proceed.   Pt is at home.  provider is at office  Pt with long history of non-compliance and recent hospitalization for DKA.   He is still having emesis.  He is gradually eating again.  He had 1 episode emesis this morning- he says it was like a burp-upchuck after eating watermelon.   Yesterday he had 3 or 4 episodes emesis.     He ate crackers and milk.    He says his bs was around 194 this morning.    Pt only using lantus 20u   That was a change he made on his on.  He checks it bid because he doesn't usually get up until lunchtime.   Yesterday bs was low, he says, later it was "2-something" he says.    Pt still going mental health through at [facility]     Relevant past medical, surgical, family and social history reviewed and updated as indicated. Interim medical history since our last visit reviewed. Allergies and medications reviewed and updated.   Current Outpatient Medications:  .  aspirin 81 MG tablet, Take 81 mg by mouth at bedtime. , Disp: , Rfl:  .  atorvastatin (LIPITOR) 80 MG tablet, Take 1 tablet (80 mg total) by mouth daily., Disp: 90 tablet, Rfl: 1 .  buPROPion (WELLBUTRIN SR) 150 MG 12 hr tablet, Take 300 mg by mouth daily., Disp: , Rfl:  .  busPIRone (BUSPAR) 5 MG tablet, Take 5 mg by mouth  3 (three) times daily., Disp: , Rfl:  .  gabapentin (NEURONTIN) 300 MG capsule, Take 1 capsule (300 mg total) by mouth 2 (two) times daily., Disp: 60 capsule, Rfl: 2 .  insulin aspart (NOVOLOG) 100 UNIT/ML injection, Before each meal 3 times a day, 140-199 - 2 units, 200-250 - 4 units, 251-299 - 6 units,  300-349 - 8 units,  350 or above 10 units. Dispense syringes and needles as needed, Ok to switch to PEN if approved. Substitute to any brand approved. DX DM2, Code E11.65, Disp: 1 vial, Rfl: 0 .  Insulin Glargine (LANTUS SOLOSTAR) 100 UNIT/ML Solostar Pen, Inject 40 Units into the skin at bedtime., Disp: 1 pen, Rfl: 3 .  isosorbide mononitrate (IMDUR) 30 MG 24 hr tablet, Take 1 tablet (30 mg total) by mouth daily., Disp: 90 tablet, Rfl: 3 .  metFORMIN (GLUCOPHAGE) 1000 MG tablet, Take 1 tablet (1,000 mg total) by mouth 2 (two) times daily with a meal., Disp: 180 tablet, Rfl: 1 .  metoprolol tartrate (LOPRESSOR) 50 MG tablet, Take 50 mg by mouth 2 (two) times daily., Disp: , Rfl:  .  nitroGLYCERIN (NITROSTAT) 0.4 MG SL tablet, Place 1 tablet (0.4 mg total) under the tongue every 5 (five) minutes as needed for chest  pain., Disp: 25 tablet, Rfl: 3 .  promethazine (PHENERGAN) 25 MG suppository, Place 1 suppository (25 mg total) rectally every 6 (six) hours as needed for nausea or vomiting., Disp: 12 each, Rfl: 0 .  PROZAC 40 MG capsule, Take 40 mg by mouth every morning., Disp: , Rfl:  .  acetaminophen (TYLENOL) 325 MG tablet, Take 2 tablets (650 mg total) by mouth every 6 (six) hours as needed for moderate pain., Disp: , Rfl:  .  famotidine (PEPCID) 40 MG tablet, Take 1 tablet (40 mg total) by mouth 2 (two) times daily. (Patient not taking: Reported on 11/13/2018), Disp: 60 tablet, Rfl: 4 .  hydrOXYzine (ATARAX/VISTARIL) 25 MG tablet, Take 25 mg by mouth 3 (three) times daily as needed for anxiety. , Disp: , Rfl:  .  metoprolol tartrate (LOPRESSOR) 25 MG tablet, Take 25 mg by mouth 2 (two) times daily. ,  Disp: , Rfl:      Review of Systems  Per HPI unless specifically indicated above     Objective:    There were no vitals taken for this visit.  Wt Readings from Last 3 Encounters:  11/09/18 170 lb (77.1 kg)  11/08/18 171 lb 8.3 oz (77.8 kg)  10/25/18 170 lb (77.1 kg)    Physical Exam Pulmonary:     Effort: No respiratory distress.  Neurological:     Mental Status: He is alert and oriented to person, place, and time.  Psychiatric:        Attention and Perception: Attention normal.        Speech: Speech normal.        Behavior: Behavior is cooperative.           Assessment & Plan:   Encounter Diagnoses  Name Primary?  [name] [name]. Uncontrolled type 2 diabetes mellitus with hyperglycemia (HCC) Yes  . Non-intractable vomiting with nausea, unspecified vomiting type      -Discussed with pt that he needs to be taking his lantus 40 units and using his novolog per ssi.  Also that he needs to monitor his fbs regularly and write it down on a log so he can discuss what his readings are, not just guessing in approximations -counseled pt to avoid dairy which can aggravate the stomach and cause him to throw up -pt to monitor his diabetes and will have telephone follow up in 1 week.  He has phenergan to use as needed.  He is to contact office sooner prn

## 2018-11-16 ENCOUNTER — Encounter: Payer: Self-pay | Admitting: Physician Assistant

## 2018-11-20 ENCOUNTER — Ambulatory Visit: Payer: Medicaid Other | Admitting: Physician Assistant

## 2018-12-16 ENCOUNTER — Other Ambulatory Visit: Payer: Self-pay

## 2018-12-16 ENCOUNTER — Emergency Department (HOSPITAL_COMMUNITY): Payer: Self-pay

## 2018-12-16 ENCOUNTER — Encounter (HOSPITAL_COMMUNITY): Payer: Self-pay | Admitting: *Deleted

## 2018-12-16 ENCOUNTER — Emergency Department (HOSPITAL_COMMUNITY)
Admission: EM | Admit: 2018-12-16 | Discharge: 2018-12-17 | Disposition: A | Discharge status: Home / Self Care | Payer: Self-pay | Attending: Emergency Medicine | Admitting: Emergency Medicine

## 2018-12-16 DIAGNOSIS — Z7982 Long term (current) use of aspirin: Secondary | ICD-10-CM | POA: Insufficient documentation

## 2018-12-16 DIAGNOSIS — R45851 Suicidal ideations: Secondary | ICD-10-CM | POA: Insufficient documentation

## 2018-12-16 DIAGNOSIS — I2510 Atherosclerotic heart disease of native coronary artery without angina pectoris: Secondary | ICD-10-CM | POA: Insufficient documentation

## 2018-12-16 DIAGNOSIS — F32A Depression, unspecified: Secondary | ICD-10-CM

## 2018-12-16 DIAGNOSIS — R112 Nausea with vomiting, unspecified: Secondary | ICD-10-CM

## 2018-12-16 DIAGNOSIS — Z79899 Other long term (current) drug therapy: Secondary | ICD-10-CM | POA: Insufficient documentation

## 2018-12-16 DIAGNOSIS — Z20828 Contact with and (suspected) exposure to other viral communicable diseases: Secondary | ICD-10-CM | POA: Insufficient documentation

## 2018-12-16 DIAGNOSIS — J449 Chronic obstructive pulmonary disease, unspecified: Secondary | ICD-10-CM | POA: Insufficient documentation

## 2018-12-16 DIAGNOSIS — Z794 Long term (current) use of insulin: Secondary | ICD-10-CM | POA: Insufficient documentation

## 2018-12-16 DIAGNOSIS — F17210 Nicotine dependence, cigarettes, uncomplicated: Secondary | ICD-10-CM | POA: Insufficient documentation

## 2018-12-16 DIAGNOSIS — F329 Major depressive disorder, single episode, unspecified: Secondary | ICD-10-CM

## 2018-12-16 DIAGNOSIS — E119 Type 2 diabetes mellitus without complications: Secondary | ICD-10-CM | POA: Insufficient documentation

## 2018-12-16 DIAGNOSIS — F332 Major depressive disorder, recurrent severe without psychotic features: Secondary | ICD-10-CM | POA: Insufficient documentation

## 2018-12-16 DIAGNOSIS — M549 Dorsalgia, unspecified: Secondary | ICD-10-CM

## 2018-12-16 DIAGNOSIS — E860 Dehydration: Secondary | ICD-10-CM

## 2018-12-16 DIAGNOSIS — F411 Generalized anxiety disorder: Secondary | ICD-10-CM | POA: Insufficient documentation

## 2018-12-16 DIAGNOSIS — I10 Essential (primary) hypertension: Secondary | ICD-10-CM | POA: Insufficient documentation

## 2018-12-16 DIAGNOSIS — F322 Major depressive disorder, single episode, severe without psychotic features: Secondary | ICD-10-CM | POA: Diagnosis present

## 2018-12-16 HISTORY — DX: Patient's noncompliance with other medical treatment and regimen: Z91.19

## 2018-12-16 HISTORY — DX: Patient's noncompliance with other medical treatment and regimen due to unspecified reason: Z91.199

## 2018-12-16 LAB — COMPREHENSIVE METABOLIC PANEL
ALT: 13 U/L (ref 0–44)
ALT: 11 U/L (ref 0–44)
AST: 15 U/L (ref 15–41)
AST: 15 U/L (ref 15–41)
Albumin: 4.1 g/dL (ref 3.5–5.0)
Albumin: 3.7 g/dL (ref 3.5–5.0)
Alkaline Phosphatase: 82 U/L (ref 38–126)
Alkaline Phosphatase: 74 U/L (ref 38–126)
Anion gap: 19 — ABNORMAL HIGH (ref 5–15)
Anion gap: 14 (ref 5–15)
BUN: 38 mg/dL — ABNORMAL HIGH (ref 6–20)
BUN: 35 mg/dL — ABNORMAL HIGH (ref 6–20)
CO2: 21 mmol/L — ABNORMAL LOW (ref 22–32)
CO2: 20 mmol/L — ABNORMAL LOW (ref 22–32)
Calcium: 9.1 mg/dL (ref 8.9–10.3)
Calcium: 8 mg/dL — ABNORMAL LOW (ref 8.9–10.3)
Chloride: 93 mmol/L — ABNORMAL LOW (ref 98–111)
Chloride: 101 mmol/L (ref 98–111)
Creatinine, Ser: 2.62 mg/dL — ABNORMAL HIGH (ref 0.61–1.24)
Creatinine, Ser: 2.06 mg/dL — ABNORMAL HIGH (ref 0.61–1.24)
GFR calc Af Amer: 32 mL/min — ABNORMAL LOW (ref >60)
GFR calc Af Amer: 43 mL/min — ABNORMAL LOW (ref >60)
GFR calc non Af Amer: 27 mL/min — ABNORMAL LOW (ref >60)
GFR calc non Af Amer: 37 mL/min — ABNORMAL LOW (ref >60)
Glucose, Bld: 97 mg/dL (ref 70–99)
Glucose, Bld: 77 mg/dL (ref 70–99)
Potassium: 4.5 mmol/L (ref 3.5–5.1)
Potassium: 4.3 mmol/L (ref 3.5–5.1)
Sodium: 133 mmol/L — ABNORMAL LOW (ref 135–145)
Sodium: 135 mmol/L (ref 135–145)
Total Bilirubin: 1.8 mg/dL — ABNORMAL HIGH (ref 0.3–1.2)
Total Bilirubin: 1.7 mg/dL — ABNORMAL HIGH (ref 0.3–1.2)
Total Protein: 7.2 g/dL (ref 6.5–8.1)
Total Protein: 6.6 g/dL (ref 6.5–8.1)

## 2018-12-16 LAB — URINALYSIS, ROUTINE W REFLEX MICROSCOPIC
Bilirubin Urine: NEGATIVE
Glucose, UA: NEGATIVE mg/dL
Ketones, ur: 20 mg/dL — AB
Nitrite: NEGATIVE
Protein, ur: 30 mg/dL — AB
Specific Gravity, Urine: 1.015 (ref 1.005–1.030)
pH: 5 (ref 5.0–8.0)

## 2018-12-16 LAB — CBC WITH DIFFERENTIAL/PLATELET
Abs Immature Granulocytes: 0.05 10*3/uL (ref 0.00–0.07)
Basophils Absolute: 0.1 10*3/uL (ref 0.0–0.1)
Basophils Relative: 0 %
Eosinophils Absolute: 0.1 10*3/uL (ref 0.0–0.5)
Eosinophils Relative: 0 %
HCT: 49 % (ref 39.0–52.0)
Hemoglobin: 16.2 g/dL (ref 13.0–17.0)
Immature Granulocytes: 0 %
Lymphocytes Relative: 22 %
Lymphs Abs: 3.5 10*3/uL (ref 0.7–4.0)
MCH: 30.8 pg (ref 26.0–34.0)
MCHC: 33.1 g/dL (ref 30.0–36.0)
MCV: 93.2 fL (ref 80.0–100.0)
Monocytes Absolute: 0.7 10*3/uL (ref 0.1–1.0)
Monocytes Relative: 5 %
Neutro Abs: 11.6 10*3/uL — ABNORMAL HIGH (ref 1.7–7.7)
Neutrophils Relative %: 73 %
Platelets: 289 10*3/uL (ref 150–400)
RBC: 5.26 MIL/uL (ref 4.22–5.81)
RDW: 12.4 % (ref 11.5–15.5)
WBC: 16.1 10*3/uL — ABNORMAL HIGH (ref 4.0–10.5)
nRBC: 0 % (ref 0.0–0.2)

## 2018-12-16 LAB — RAPID URINE DRUG SCREEN, HOSP PERFORMED
Amphetamines: NOT DETECTED
Barbiturates: NOT DETECTED
Benzodiazepines: NOT DETECTED
Cocaine: NOT DETECTED
Opiates: NOT DETECTED
Tetrahydrocannabinol: NOT DETECTED

## 2018-12-16 LAB — MAGNESIUM: Magnesium: 1.9 mg/dL (ref 1.7–2.4)

## 2018-12-16 LAB — CBG MONITORING, ED: Glucose-Capillary: 106 mg/dL — ABNORMAL HIGH (ref 70–99)

## 2018-12-16 LAB — TROPONIN I (HIGH SENSITIVITY)
Troponin I (High Sensitivity): 8 ng/L (ref <18)
Troponin I (High Sensitivity): 8 ng/L (ref <18)

## 2018-12-16 LAB — ACETAMINOPHEN LEVEL: Acetaminophen (Tylenol), Serum: <10 ug/mL — ABNORMAL LOW (ref 10–30)

## 2018-12-16 LAB — SARS CORONAVIRUS 2 BY RT PCR (HOSPITAL ORDER, PERFORMED IN ~~LOC~~ HOSPITAL LAB): SARS Coronavirus 2: NEGATIVE

## 2018-12-16 LAB — LACTIC ACID, PLASMA: Lactic Acid, Venous: 1.5 mmol/L (ref 0.5–1.9)

## 2018-12-16 LAB — SALICYLATE LEVEL: Salicylate Lvl: <7 mg/dL (ref 2.8–30.0)

## 2018-12-16 LAB — ETHANOL: Alcohol, Ethyl (B): <10 mg/dL (ref <10)

## 2018-12-16 LAB — LIPASE, BLOOD: Lipase: 20 U/L (ref 11–51)

## 2018-12-16 MED ORDER — BUPROPION HCL ER (SR) 150 MG PO TB12
300.0000 mg | ORAL_TABLET | Freq: Every day | ORAL | Status: DC
  Filled 2018-12-16 (×2): qty 2

## 2018-12-16 MED ORDER — SODIUM CHLORIDE 0.9 % IV SOLN
INTRAVENOUS | Status: DC
  Administered 2018-12-16: 150 mL/h via INTRAVENOUS

## 2018-12-16 MED ORDER — ACETAMINOPHEN 325 MG PO TABS
650.0000 mg | ORAL_TABLET | Freq: Once | ORAL | Status: DC
  Filled 2018-12-16 (×2): qty 2

## 2018-12-16 MED ORDER — ATORVASTATIN CALCIUM 40 MG PO TABS
80.0000 mg | ORAL_TABLET | Freq: Every day | ORAL | Status: DC
  Administered 2018-12-16 – 2018-12-17 (×2): 80 mg via ORAL
  Filled 2018-12-16 (×2): qty 2

## 2018-12-16 MED ORDER — METFORMIN HCL 500 MG PO TABS
1000.0000 mg | ORAL_TABLET | Freq: Two times a day (BID) | ORAL | Status: DC
  Administered 2018-12-17: 1000 mg via ORAL
  Filled 2018-12-16: qty 2

## 2018-12-16 MED ORDER — ACETAMINOPHEN 325 MG PO TABS
650.0000 mg | ORAL_TABLET | Freq: Four times a day (QID) | ORAL | Status: DC | PRN
  Administered 2018-12-16 – 2018-12-17 (×2): 650 mg via ORAL
  Filled 2018-12-16: qty 2

## 2018-12-16 MED ORDER — FLUOXETINE HCL 20 MG PO CAPS
40.0000 mg | ORAL_CAPSULE | Freq: Every morning | ORAL | Status: DC
  Administered 2018-12-17: 40 mg via ORAL
  Filled 2018-12-16 (×3): qty 2

## 2018-12-16 MED ORDER — ONDANSETRON HCL 4 MG/2ML IJ SOLN
4.0000 mg | Freq: Once | INTRAMUSCULAR | Status: AC
  Administered 2018-12-16: 4 mg via INTRAVENOUS
  Filled 2018-12-16: qty 2

## 2018-12-16 MED ORDER — INSULIN GLARGINE 100 UNIT/ML ~~LOC~~ SOLN
40.0000 [IU] | Freq: Every day | SUBCUTANEOUS | Status: DC
  Filled 2018-12-16 (×2): qty 0.4

## 2018-12-16 MED ORDER — SODIUM CHLORIDE 0.9 % IV BOLUS
1000.0000 mL | Freq: Once | INTRAVENOUS | Status: AC
  Administered 2018-12-16: 1000 mL via INTRAVENOUS

## 2018-12-16 MED ORDER — ASPIRIN EC 81 MG PO TBEC
81.0000 mg | DELAYED_RELEASE_TABLET | Freq: Every day | ORAL | Status: DC
  Administered 2018-12-17: 81 mg via ORAL
  Filled 2018-12-16: qty 1

## 2018-12-16 MED ORDER — ISOSORBIDE MONONITRATE ER 30 MG PO TB24
30.0000 mg | ORAL_TABLET | Freq: Every day | ORAL | Status: DC
  Filled 2018-12-16 (×2): qty 1

## 2018-12-16 MED ORDER — ARIPIPRAZOLE 2 MG PO TABS
2.0000 mg | ORAL_TABLET | Freq: Every day | ORAL | Status: DC
  Administered 2018-12-16: 2 mg via ORAL
  Filled 2018-12-16 (×4): qty 1

## 2018-12-16 MED ORDER — INSULIN ASPART 100 UNIT/ML ~~LOC~~ SOLN
0.0000 [IU] | SUBCUTANEOUS | Status: DC
  Administered 2018-12-17: 1 [IU] via SUBCUTANEOUS
  Administered 2018-12-17: 2 [IU] via SUBCUTANEOUS
  Filled 2018-12-16 (×2): qty 1

## 2018-12-16 MED ORDER — METOPROLOL TARTRATE 25 MG PO TABS
25.0000 mg | ORAL_TABLET | Freq: Two times a day (BID) | ORAL | Status: DC
  Administered 2018-12-17 (×2): 25 mg via ORAL
  Filled 2018-12-16 (×2): qty 1

## 2018-12-16 MED ORDER — BUSPIRONE HCL 5 MG PO TABS
5.0000 mg | ORAL_TABLET | Freq: Three times a day (TID) | ORAL | Status: DC
  Administered 2018-12-17: 5 mg via ORAL
  Filled 2018-12-16: qty 1

## 2018-12-16 NOTE — ED Notes (Signed)
ICe water given to drink.   avasys set up. Explained procedure to pt and he verbalized understanding.

## 2018-12-16 NOTE — ED Provider Notes (Signed)
Pt received at sign out with labs, XR/CT, TTS pending.    1900:  IVF NS boluses given for mild elevation of BUN/Cr with improvement on re-check. Will continue IVF and re-check CMP in morning.  TTS has evaluated pt: recommends to hold in ED overnight for re-evaluation tomorrow morning.   1945:  Pt states to ED staff "he's leaving" because he's "not getting any oxy's," telling ED RN that he "takes oxy's at home."  No narcotics noted on PMP database or on meds list; question drug seeking behavior. IVC paperwork completed. Holding orders written. Will sign out to oncoming EDP.    Francine Graven, DO 12/16/18 2255

## 2018-12-16 NOTE — [facility] Counselor (Signed)
Disposition: Elmarie Shiley, PMHNP recommends patient be observed overnight for safety and stabilization. Patient to be reassessed by psychiatry 7/19 AM.

## 2018-12-16 NOTE — ED Triage Notes (Signed)
Pt reports decreased appetite and weakness since 06/29. States if he eats he vomits. Mid and upper back pain since fall 3 days ago. States he is very weak and has fallen 3 times in the last 3 weeks.

## 2018-12-16 NOTE — ED Notes (Signed)
Pt reports decreased appetite and n/v off and on since June.  Also says has been dizzy and passed out several times since June.  Denies diarrhea, cough, fever, or sob.

## 2018-12-16 NOTE — BH Assessment (Signed)
Tele Assessment Note   Patient Name: [name] PettyDanny L Plack MRN: [id number] Referring Physician: Hyacinth MeekerMiller Location of Patient: [facility] Location of Provider: Behavioral Health TTS Department  [name] [name] is an 48 y.o. male presenting voluntarily to [facility] ED reporting weakness, decreased appetite, and several falls. Patient additionally reports SI to EDP. Patient is tearful during assessment process, crying heavily at times, and states he feels "helpless." He states that ever since his brother moved back into the home in January "things have been getting worse." Patient reports a history of depression and anxiety. He states that his anxiety is severe and that he "is terrified of being around people." Patient endorses passive SI without specific plan or intent. He reports thinking "it would be better if I wasn't here" and "would anyone care if I died?" Patient denies any prior suicide attempts. Per chart review, patient accessed ED in May 2020 for SI but was not seen by TTS and was discharged. Patient reports vague HI toward brother without plan or intent. He denies AVH. Patient states that he has a poor appetite and lost 20 lbs in 1 month. He also reports insomnia, only sleeping off and on a couple hours per night. [facility]. He is unable to recall what medications he takes. He states he used to see a therapist that he was "finally able to open up to" but she left her job. He then stopped participating in OPT as he did not like tele-health. He denies any substance use or criminal charges.  Patient is alert and oriented x 4. He is dressed in scrubs laying in hospital bed. His speech is logical, eye contact is fair, and thoughts are organized. Patient's mood is depressed and affect is congruent. His insight, judgement, and impulse control are partially impaired. Patient does not appear to be responding to internal stimuli or experiencing delusional thought content.    Diagnosis: F33.2 MDD, recurrent, severe  F41.1 GAD     Past Medical History:  Past Medical History:  Diagnosis Date  . Anxiety   . Arthritis   . CAD (coronary artery disease)    Moderate LAD disease 2016 - Dr. Jacinto HalimGanji  . Chronic bronchitis (HCC)   . Chronic upper back pain   . COPD (chronic obstructive pulmonary disease) (HCC)   . Depression   . Diabetic peripheral neuropathy (HCC)   . GERD (gastroesophageal reflux disease)   . History of gout   . Hyperlipemia   . Hypertension   . Migraine   . Noncompliance   . 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 Sleep apnea 2016  . Type 2 diabetes mellitus (HCC)     Past Surgical History:  Procedure Laterality Date  . ANKLE SURGERY Right 1982   "had extra bones in there; took them out"  . APPENDECTOMY  1975  . CARDIAC CATHETERIZATION N/A 03/28/2015   Procedure: Left Heart Cath and Coronary Angiography;  Surgeon: Yates DecampJay Ganji, MD;  [facility];  Service: Cardiovascular;  Laterality: N/A;  . CARDIAC CATHETERIZATION N/A 03/28/2015   Procedure: Intravascular Pressure Wire/FFR Study;  Surgeon: Yates DecampJay Ganji, MD;  [facility];  Service: Cardiovascular;  Laterality: N/A;  . CARPAL TUNNEL RELEASE Left ~ 2008  . COLONOSCOPY WITH ESOPHAGOGASTRODUODENOSCOPY (EGD)    . ELBOW FRACTURE SURGERY Left ~ 2008  . FRACTURE SURGERY    . PILONIDAL CYST EXCISION N/A 03/24/2017   Procedure: EXCISION CHRONIC  PILONIDAL ABSCESS;  Surgeon: Magnus IvanBlackman,  Riley Lamouglas, MD;  Location: [facility] ORS;  Service: General;  Laterality: N/A;  . TENDON REPAIR Left ~ 2004   "main tendon in my ankle"    Family History:  Family History  Problem Relation Age of Onset  . Congestive Heart Failure Sister   . Diabetes Mother   . Hypertension Mother   . Diabetes Father   . Hypertension Father     Social History:  reports that he has been smoking cigarettes. He has a 17.00 pack-year smoking history. He has never used smokeless tobacco. He reports that he does  not drink alcohol or use drugs.  Additional Social History:  Alcohol / Drug Use Pain Medications: see MAR Prescriptions: see MAR Over the Counter: see MAR History of alcohol / drug use?: No history of alcohol / drug abuse  CIWA: CIWA-Ar BP: (!) 139/91 Pulse Rate: 99 COWS:    Allergies:  Allergies  Allergen Reactions  . Omeprazole Magnesium Swelling    Face swells, no breathing impairment  . Esomeprazole Swelling    Face swells, no breathing impairment    Home Medications: (Not in a hospital admission)   OB/GYN Status:  No LMP for male patient.  General Assessment Data Assessment unable to be completed: Yes Reason for not completing assessment: (patient getting xray) Location of Assessment: [facility] TTS Assessment: In system Is this a Tele or Face-to-Face Assessment?: Tele Assessment Is this an Initial Assessment or a Re-assessment for this encounter?: Initial Assessment Patient Accompanied by:: N/A Language Other than English: No Living Arrangements: Other (Comment)(parent's home) What gender do you identify as?: Male Marital status: Single Maiden name: Tenpenny Pregnancy Status: No Living Arrangements: Parent Can pt return to current living arrangement?: Yes Admission Status: Voluntary Is patient capable of signing voluntary admission?: Yes Referral Source: Self/Family/Friend Insurance type: None     Crisis Care Plan Living Arrangements: Parent Legal Guardian: (self) Name of Psychiatrist: [facility] Name of Therapist: [facility]  Education Status Is patient currently in school?: No Is the patient employed, unemployed or receiving disability?: Unemployed  Risk to self with the past 6 months Suicidal Ideation: Yes-Currently Present Has patient been a risk to self within the past 6 months prior to admission? : No Suicidal Intent: No Has patient had any suicidal intent within the past 6 months prior to admission? : No Is patient at risk for suicide?: No Suicidal  Plan?: No Has patient had any suicidal plan within the past 6 months prior to admission? : No Access to Means: No What has been your use of drugs/alcohol within the last 12 months?: denies Previous Attempts/Gestures: No How many times?: 0 Other Self Harm Risks: none noted Triggers for Past Attempts: None known Intentional Self Injurious Behavior: None Family Suicide History: No Recent stressful life event(s): Financial Problems, Conflict (Comment)(conflict with brother) Persecutory voices/beliefs?: No Depression: Yes Depression Symptoms: Despondent, Insomnia, Tearfulness, Isolating, Fatigue, Guilt, Loss of interest in usual pleasures, Feeling worthless/self pity, Feeling angry/irritable Substance abuse history and/or treatment for substance abuse?: No Suicide prevention information given to non-admitted patients: Not applicable  Risk to Others within the past 6 months Homicidal Ideation: Yes-Currently Present Does patient have any lifetime risk of violence toward others beyond the six months prior to admission? : No Thoughts of Harm to Others: Yes-Currently Present Comment - Thoughts of Harm to Others: brother Current Homicidal Intent: No Current Homicidal Plan: No Access to Homicidal Means: No Identified Victim: none History of harm to others?: No Assessment of Violence: None Noted Violent Behavior Description: none  noted Does patient have access to weapons?: No Criminal Charges Pending?: No Does patient have a court date: No Is patient on probation?: No  Psychosis Hallucinations: None noted Delusions: None noted  Mental Status Report Appearance/Hygiene: In scrubs Eye Contact: Fair Motor Activity: Freedom of movement Speech: Logical/coherent Level of Consciousness: Crying, Alert Mood: Depressed Affect: Depressed Anxiety Level: Minimal Thought Processes: Coherent, Relevant Judgement: Impaired Orientation: Person, Place, Time, Situation Obsessive Compulsive  Thoughts/Behaviors: None  Cognitive Functioning Concentration: Normal Memory: Recent Intact, Remote Intact Is patient IDD: No Insight: Fair Impulse Control: Fair Appetite: Poor Have you had any weight changes? : Loss Amount of the weight change? (lbs): 20 lbs Sleep: Decreased Total Hours of Sleep: 2 Vegetative Symptoms: None  ADLScreening [facility] Assessment Services) Patient's cognitive ability adequate to safely complete daily activities?: Yes Patient able to express need for assistance with ADLs?: Yes Independently performs ADLs?: Yes (appropriate for developmental age)  Prior Inpatient Therapy Prior Inpatient Therapy: No  Prior Outpatient Therapy Prior Outpatient Therapy: Yes Prior Therapy Dates: ongoing Prior Therapy Facilty/Provider(s): [facility] Reason for Treatment: med management and therapy Does patient have an ACCT team?: No Does patient have Intensive In-House Services?  : No Does patient have [facility] services? : No Does patient have P4CC services?: No  ADL Screening (condition at time of admission) Patient's cognitive ability adequate to safely complete daily activities?: Yes Is the patient deaf or have difficulty hearing?: No Does the patient have difficulty seeing, even when wearing glasses/contacts?: No Does the patient have difficulty concentrating, remembering, or making decisions?: No Patient able to express need for assistance with ADLs?: Yes Does the patient have difficulty dressing or bathing?: No Independently performs ADLs?: Yes (appropriate for developmental age) Does the patient have difficulty walking or climbing stairs?: No Weakness of Legs: None Weakness of Arms/Hands: None  Home Assistive Devices/Equipment Home Assistive Devices/Equipment: None  Therapy Consults (therapy consults require a physician order) PT Evaluation Needed: No OT Evalulation Needed: No SLP Evaluation Needed: No Abuse/Neglect Assessment (Assessment to be complete while  patient is alone) Abuse/Neglect Assessment Can Be Completed: Yes Physical Abuse: Denies Verbal Abuse: Denies Sexual Abuse: Denies Exploitation of patient/patient's resources: Denies Self-Neglect: Denies Values / Beliefs Cultural Requests During Hospitalization: None Spiritual Requests During Hospitalization: None Consults Spiritual Care Consult Needed: No Social Work Consult Needed: No Regulatory affairs officer (For Healthcare) Does Patient Have a Medical Advance Directive?: No Would patient like information on creating a medical advance directive?: No - Patient declined          Disposition: Elmarie Shiley, PMHNP recommends patient be observed overnight for safety and stabilization. Patient to be reassessed by psychiatry 7/19 AM. Disposition Initial Assessment Completed for this Encounter: Yes  This service was provided via telemedicine using a 2-way, interactive audio and video technology.  Names of all persons participating in this telemedicine service and their role in this encounter. Name: Yuki Truxillo Role: patient  Name: Orvis Brill, LCSW Role: TTS  Name:  Role:   Name:  Role:     Orvis Brill 12/16/2018 4:31 PM

## 2018-12-16 NOTE — ED Provider Notes (Signed)
[facility] EMERGENCY DEPARTMENT Provider Note   CSN: [id number] Arrival date & time: 12/16/18  1405     History   Chief Complaint Chief Complaint  Patient presents with   Weakness    HPI [name] [name] is a 48 y.o. male.     HPI   The patient is a 48 year old male, he has a known history of diabetes which has been fairly uncontrolled in the past, he does [facility]-up [facility] the [facility] with Jacquelin HawkingShannon McElroy.  He has a history of admissions to the hospital for diabetic ketoacidosis and has been seen in the emergency department for persistent vomiting as late as June 11 of 2020.  He states that since that visit he has had almost persistent daily nausea and vomiting.  He reports that he is unable to eat much in the way of food but is taking some fluids.  He is continuing to have intermittent episodes of vomiting but denies diarrhea in fact he states that he is not having bowel movements.  Additionally the patient feels like he is always hot, he has not measured any fevers but feels like he has subjective or tactile fevers.  He also complains of urinary retention stating that he has to strain to have any urine, he cannot maintain a good stream but is able to empty his bladder according to what he thinks.  He denies any rashes, swelling or significant abdominal pain, he does complain of pain in his back and has had 3 falls over the last month that he reports are related to weakness.  The other day he was standing in the hallway waiting for his mother to get out of the bathroom and became lightheaded falling to the ground.  No changes in vision, no sore throat, no exposures to anybody with coronavirus and no testing.  When asked about his symptoms that brought him to the hospital last time even though it was documented more nausea and vomiting he reports that since then he has had almost a persistent diffuse myalgias, fatigue and generalized weakness.  There is some coughing and shortness of breath  mixed in with it as well  Interestingly when you asked the patient questions he alternates between tears and composure and frequently talks about his pursuit of disability compensation   Past Medical History:  Diagnosis Date   Anxiety    Arthritis    CAD (coronary artery disease)    Moderate LAD disease 2016 - Dr. Jacinto HalimGanji   Chronic bronchitis [facility](HCC)    Chronic upper back pain    COPD (chronic obstructive pulmonary disease) (HCC)    Depression    Diabetic peripheral neuropathy (HCC)    GERD (gastroesophageal reflux disease)    History of gout    Hyperlipemia    Hypertension    Migraine    Ringing in the ears, bilateral    Sleep apnea 2016   Type 2 diabetes mellitus [facility](HCC)     Patient Active Problem List   Diagnosis Date Noted   DKA, type 2 (HCC) 11/07/2018   High anion gap metabolic acidosis 03/22/2018   Epigastric abdominal pain 03/22/2018   Nausea vomiting and diarrhea 03/22/2018   AKI (acute kidney injury) (HCC) 03/22/2018   Uncontrolled diabetes mellitus (HCC) 03/22/2018   CAD (coronary artery disease) 02/15/2018   Hyperlipidemia 02/15/2018   Tobacco abuse 02/15/2018   Diabetic gastroparesis (HCC) 06/02/2017   ARF (acute renal failure) (HCC) 05/30/2017   DKA (diabetic ketoacidoses) (HCC) 10/15/2016   Gastroesophageal reflux  disease 10/15/2016   Anxiety with depression 10/15/2016   HTN (hypertension) 10/15/2016   Leukocytosis 11/25/2015   Intractable nausea and vomiting 11/25/2015   Dental abscess 11/25/2015   Nausea and vomiting 11/25/2015   Chest pain 03/27/2015    Past Surgical History:  Procedure Laterality Date   ANKLE SURGERY Right 1982   "had extra bones in there; took them out"   [place] N/A 03/28/2015   Procedure: Left Heart Cath and Coronary Angiography;  Surgeon: Adrian Prows, MD;  Location: [place] CV LAB;  Service: Cardiovascular;  Laterality: N/A;   CARDIAC  CATHETERIZATION N/A 03/28/2015   Procedure: Intravascular Pressure Wire/FFR Study;  Surgeon: Adrian Prows, MD;  Location: [place] CV LAB;  Service: Cardiovascular;  Laterality: N/A;   CARPAL TUNNEL RELEASE Left ~ 2008   COLONOSCOPY WITH ESOPHAGOGASTRODUODENOSCOPY (EGD)     ELBOW FRACTURE SURGERY Left ~ 2008   FRACTURE SURGERY     PILONIDAL CYST EXCISION N/A 03/24/2017   Procedure: EXCISION CHRONIC  PILONIDAL ABSCESS;  Surgeon: Coralie Keens, MD;  Location: [facility] ORS;  Service: General;  Laterality: N/A;   TENDON REPAIR Left ~ 2004   "main tendon in my ankle"        Home Medications    Prior to Admission medications   Medication Sig Start Date End Date Taking? Authorizing Provider  acetaminophen (TYLENOL) 325 MG tablet Take 2 tablets (650 mg total) by mouth every 6 (six) hours as needed for moderate pain. 03/25/18   Elgergawy, Silver Huguenin, MD  aspirin 81 MG tablet Take 81 mg by mouth at bedtime.     [provider]  atorvastatin (LIPITOR) 80 MG tablet Take 1 tablet (80 mg total) by mouth daily. 08/16/18   Soyla Dryer, PA-C  buPROPion (WELLBUTRIN SR) 150 MG 12 hr tablet Take 300 mg by mouth daily.    [provider]  busPIRone (BUSPAR) 5 MG tablet Take 5 mg by mouth 3 (three) times daily.    [provider]  famotidine (PEPCID) 40 MG tablet Take 1 tablet (40 mg total) by mouth 2 (two) times daily. Patient not taking: Reported on 11/13/2018 08/15/18   Butch Penny, NP  gabapentin (NEURONTIN) 300 MG capsule Take 1 capsule (300 mg total) by mouth 2 (two) times daily. 08/01/18   Soyla Dryer, PA-C  hydrOXYzine (ATARAX/VISTARIL) 25 MG tablet Take 25 mg by mouth 3 (three) times daily as needed for anxiety.     [provider]  insulin aspart (NOVOLOG) 100 UNIT/ML injection Before each meal 3 times a day, 140-199 - 2 units, 200-250 - 4 units, 251-299 - 6 units,  300-349 - 8 units,  350 or above 10 units. Dispense syringes and needles as needed, Ok to  switch to PEN if approved. Substitute to any brand approved. DX DM2, Code E11.65 03/19/18   Thurnell Lose, MD  Insulin Glargine (LANTUS SOLOSTAR) 100 UNIT/ML Solostar Pen Inject 40 Units into the skin at bedtime. 11/08/18   Barton Dubois, MD  isosorbide mononitrate (IMDUR) 30 MG 24 hr tablet Take 1 tablet (30 mg total) by mouth daily. 06/14/18 11/13/18  Satira Sark, MD  metFORMIN (GLUCOPHAGE) 1000 MG tablet Take 1 tablet (1,000 mg total) by mouth 2 (two) times daily with a meal. 08/16/18   Soyla Dryer, PA-C  metoprolol tartrate (LOPRESSOR) 25 MG tablet Take 25 mg by mouth 2 (two) times daily.     [provider]  metoprolol tartrate (LOPRESSOR) 50 MG tablet  Take 50 mg by mouth 2 (two) times daily.    [provider]  nitroGLYCERIN (NITROSTAT) 0.4 MG SL tablet Place 1 tablet (0.4 mg total) under the tongue every 5 (five) minutes as needed for chest pain. 10/05/17   Jacquelin HawkingMcElroy, Shannon, PA-C  promethazine (PHENERGAN) 25 MG suppository Place 1 suppository (25 mg total) rectally every 6 (six) hours as needed for nausea or vomiting. 11/09/18   Bethann BerkshireZammit, Joseph, MD  PROZAC 40 MG capsule Take 40 mg by mouth every morning. 10/13/18   [provider]    Family History Family History  Problem Relation Age of Onset   Congestive Heart Failure Sister    Diabetes Mother    Hypertension Mother    Diabetes Father    Hypertension Father     Social History Social History   Tobacco Use   Smoking status: Current Every Day Smoker    Packs/day: 0.50    Years: 34.00    Pack years: 17.00    Types: Cigarettes   Smokeless tobacco: Never Used  Substance Use Topics   Alcohol use: Never    Alcohol/week: 0.0 standard drinks    Frequency: Never   Drug use: Never     Allergies   Omeprazole magnesium and Esomeprazole   Review of Systems Review of Systems  All other systems reviewed and are negative.    Physical Exam Updated Vital Signs BP 117/79 (BP Location:  Right Arm)    Pulse (!) 130    Temp 98.3 F (36.8 C) (Oral)    Resp 18    Ht 1.727 m (5\' 8" )    Wt (!) 152 kg    SpO2 99%    BMI 50.95 kg/m   Physical Exam Vitals signs and nursing note reviewed.  Constitutional:      General: He is not in acute distress.    Appearance: He is well-developed.     Comments: Tearful and tachycardic  HENT:     Head: Normocephalic and atraumatic.     Mouth/Throat:     Pharynx: No oropharyngeal exudate.     Comments: The patient has had all of his teeth removed, the mucous membranes appear normal to slightly dry Eyes:     General: No scleral icterus.       Right eye: No discharge.        Left eye: No discharge.     Conjunctiva/sclera: Conjunctivae normal.     Pupils: Pupils are equal, round, and reactive to light.  Neck:     Musculoskeletal: Normal range of motion and neck supple.     Thyroid: No thyromegaly.     Vascular: No JVD.  Cardiovascular:     Rate and Rhythm: Regular rhythm. Tachycardia present.     Heart sounds: Normal heart sounds. No murmur. No friction rub. No gallop.   Pulmonary:     Effort: Pulmonary effort is normal. No respiratory distress.     Breath sounds: Normal breath sounds. No wheezing or rales.  Abdominal:     General: Bowel sounds are normal. There is no distension.     Palpations: Abdomen is soft. There is no mass.     Tenderness: There is no abdominal tenderness.     Comments: The abdomen is completely soft and nontender, there is no palpable mass in the pelvis  Musculoskeletal: Normal range of motion.        General: Tenderness present.     Comments: Mild tenderness over the lower back  Lymphadenopathy:  Cervical: No cervical adenopathy.  Skin:    General: Skin is warm and dry.     Findings: No erythema or rash.  Neurological:     Mental Status: He is alert.     Coordination: Coordination normal.     Comments: The patient speech is clear, he is able to move all 4 extremities with normal strength, there is no  facial droop  Psychiatric:     Comments: The patient is tearful but answers questions appropriately, he does endorse some passive suicidality      ED Treatments / Results  Labs (all labs ordered are listed, but only abnormal results are displayed) Labs Reviewed  URINE CULTURE  SARS CORONAVIRUS 2 (HOSPITAL ORDER, PERFORMED IN  HOSPITAL LAB)  CBC WITH DIFFERENTIAL/PLATELET  COMPREHENSIVE METABOLIC PANEL  LIPASE, BLOOD  LACTIC ACID, PLASMA  URINALYSIS, ROUTINE W REFLEX MICROSCOPIC  MAGNESIUM  ACETAMINOPHEN LEVEL  SALICYLATE LEVEL  TROPONIN I (HIGH SENSITIVITY)    EKG None  Radiology No results found.  Procedures Procedures (including critical care time)  Medications Ordered in ED Medications  sodium chloride 0.9 % bolus 1,000 mL (has no administration in time range)  sodium chloride 0.9 % bolus 1,000 mL (has no administration in time range)     Initial Impression / Assessment and Plan / ED Course  I have reviewed the triage vital signs and the nursing notes.  Pertinent labs & imaging results that were available during my care of the patient were reviewed by me and considered in my medical decision making (see chart for details).       There are multiple processes going on including this concept of apparent dehydration from persistent vomiting though he states that he can tolerate fluids, there is the idea of the urinary retention and what to do about that as well as this blood sugar that is just over 100.  He is certainly not in DKA but is tachycardic and whether this is related to his underlying depression, suicidality, potential abuse of drugs or medications or just dehydration from persistent nausea it is not clear.  Will obtain labs, give IV fluids, and check a chest x-ray of his back after the fall.  Final Clinical Impressions(s) / ED Diagnoses   Final diagnoses:  None    ED Discharge Orders    None       Eber HongMiller, Vrinda Heckstall, MD 12/17/18 1438

## 2018-12-17 DIAGNOSIS — F332 Major depressive disorder, recurrent severe without psychotic features: Secondary | ICD-10-CM

## 2018-12-17 DIAGNOSIS — R45851 Suicidal ideations: Secondary | ICD-10-CM

## 2018-12-17 DIAGNOSIS — F322 Major depressive disorder, single episode, severe without psychotic features: Secondary | ICD-10-CM | POA: Diagnosis present

## 2018-12-17 LAB — CBG MONITORING, ED
Glucose-Capillary: 99 mg/dL (ref 70–99)
Glucose-Capillary: 170 mg/dL — ABNORMAL HIGH (ref 70–99)
Glucose-Capillary: 121 mg/dL — ABNORMAL HIGH (ref 70–99)
Glucose-Capillary: 96 mg/dL (ref 70–99)

## 2018-12-17 LAB — COMPREHENSIVE METABOLIC PANEL
ALT: 10 U/L (ref 0–44)
AST: 13 U/L — ABNORMAL LOW (ref 15–41)
Albumin: 3.6 g/dL (ref 3.5–5.0)
Alkaline Phosphatase: 72 U/L (ref 38–126)
Anion gap: 16 — ABNORMAL HIGH (ref 5–15)
BUN: 28 mg/dL — ABNORMAL HIGH (ref 6–20)
CO2: 22 mmol/L (ref 22–32)
Calcium: 8.5 mg/dL — ABNORMAL LOW (ref 8.9–10.3)
Chloride: 99 mmol/L (ref 98–111)
Creatinine, Ser: 1.59 mg/dL — ABNORMAL HIGH (ref 0.61–1.24)
GFR calc Af Amer: 58 mL/min — ABNORMAL LOW (ref >60)
GFR calc non Af Amer: 50 mL/min — ABNORMAL LOW (ref >60)
Glucose, Bld: 153 mg/dL — ABNORMAL HIGH (ref 70–99)
Potassium: 5 mmol/L (ref 3.5–5.1)
Sodium: 137 mmol/L (ref 135–145)
Total Bilirubin: 2 mg/dL — ABNORMAL HIGH (ref 0.3–1.2)
Total Protein: 6.6 g/dL (ref 6.5–8.1)

## 2018-12-17 MED ORDER — DIPHENHYDRAMINE HCL 25 MG PO CAPS
50.0000 mg | ORAL_CAPSULE | Freq: Once | ORAL | Status: AC
  Administered 2018-12-17: 50 mg via ORAL
  Filled 2018-12-17: qty 2

## 2018-12-17 MED ORDER — ONDANSETRON HCL 4 MG/2ML IJ SOLN
4.0000 mg | Freq: Once | INTRAMUSCULAR | Status: AC
  Administered 2018-12-17: 4 mg via INTRAVENOUS
  Filled 2018-12-17: qty 2

## 2018-12-17 MED ORDER — INSULIN GLARGINE 100 UNIT/ML ~~LOC~~ SOLN
10.0000 [IU] | SUBCUTANEOUS | Status: AC
  Administered 2018-12-17: 10 [IU] via SUBCUTANEOUS
  Filled 2018-12-17: qty 0.1

## 2018-12-17 MED ORDER — ONDANSETRON 4 MG PO TBDP: 4.0000 mg | ORAL_TABLET | Freq: Three times a day (TID) | ORAL | 0 refills | Status: DC | PRN

## 2018-12-17 NOTE — Progress Notes (Signed)
Collateral information gained from [employer], patient's mother, and she has no concerns with the patient being discharged home. She is concerned about his stomach pain and vomiting. She states that he has some depression, but has not been suicidal. She feels safe with him coming home.

## 2018-12-17 NOTE — ED Provider Notes (Signed)
This is the second day that the patient has been here and overnight there is been no decompensation in fact the patient's vital signs have normalized.  He appears very well, the family members are aware of the patient and are willing to take him back and suggest that they can care for him adequately.  The patient does not have any signs of decompensation, he is stable without suicidal thoughts or significant hallucinations and at this time will be discharged home.   Noemi Chapel, MD 12/17/18 1438

## 2018-12-17 NOTE — Consult Note (Addendum)
Telepsych Consultation   Reason for Consult:  Suicidal ideations Referring Physician:  EDp Location of Patient:  Location of Provider: Behavioral [facility] TTS Department  Patient Identification: [name] [name] MRN:  [id number] Principal Diagnosis: MDD (major depressive disorder), severe (HCC) Diagnosis:  Principal Problem:   MDD (major depressive disorder), severe (HCC)   Total Time spent with patient: 30 minutes  Subjective:   [name] PettyDanny L [name] is a 48 y.o. male patient reports that he is feeling much better today.  Patient denies any suicidal or homicidal ideations and denies any hallucinations.  Patient reports that he was just feeling very sick yesterday and states that he thinks that he was possibly dehydrated because he reports being administered 5 bags of fluids.  He states that he lives with his mother and his father and his mother was diagnosed with cancer and that is why his brother moved back in January.  He feels that his brother moved back into "brown nose" so that he can get more when his mother passes away.  He denies having access to any weapons.  He agreed to let us gain collateral information from his mother [name] [name] at [phone].  HPI:  48 y.o. male presenting voluntarily to [facility] reporting weakness, decreased appetite, and several falls. Patient additionally reports SI to EDP. Patient is tearful during assessment process, crying heavily at times, and states he feels "helpless." He states that ever since his brother moved back into the home in January "things have been getting worse." Patient reports a history of depression and anxiety. He states that his anxiety is severe and that he "is terrified of being around people." Patient endorses passive SI without specific plan or intent. He reports thinking "it would be better if I wasn't here" and "would anyone care if I died?" Patient denies any prior suicide attempts. Per chart review, patient accessed ED in May 2020 for SI  but was not seen by TTS and was discharged. Patient reports vague HI toward brother without plan or intent. He denies AVH. Patient states that he has a poor appetite and lost 20 lbs in 1 month. He also reports insomnia, only sleeping off and on a couple hours per night. [facility]. He is unable to recall what medications he takes. He states he used to see a therapist that he was "finally able to open up to" but she left her job. He then stopped participating in OPT as he did not like tele-health. He denies any substance use or criminal charges.  Patient is seen by me via tele-psych and I have consulted with Dr. Lucianne MussKumar.  He was unable to obtain collateral information at this time [name] as she did not answer the phone.  A voicemail was left for her to contact me back.  However, at this time the patient does not meet inpatient criteria and is psychiatrically cleared.  Would prefer to have collateral information and a safety plan established to ensure there are no weapons accessible and that there is been no other comments or concerns in the household. I have contacted Dr. Hyacinth MeekerMiller and notified him of the recommendations and the plan for discharge.  Past Psychiatric History: Denies  Risk to Self: Suicidal Ideation: Yes-Currently Present Suicidal Intent: No Is patient at risk for suicide?: No Suicidal Plan?: No Access to Means: No What has been your use of drugs/alcohol within the last 12 months?: denies How many times?: 0 Other Self Harm Risks:  none noted Triggers for Past Attempts: None known Intentional Self Injurious Behavior: None Risk to Others: Homicidal Ideation: Yes-Currently Present Thoughts of Harm to Others: Yes-Currently Present Comment - Thoughts of Harm to Others: brother Current Homicidal Intent: No Current Homicidal Plan: No Access to Homicidal Means: No Identified Victim: none History of harm to others?: No Assessment of  Violence: None Noted Violent Behavior Description: none noted Does patient have access to weapons?: No Criminal Charges Pending?: No Does patient have a court date: No Prior Inpatient Therapy: Prior Inpatient Therapy: No Prior Outpatient Therapy: Prior Outpatient Therapy: Yes Prior Therapy Dates: ongoing Prior Therapy Facilty/Provider(s): [facility] Reason for Treatment: med management and therapy Does patient have an ACCT team?: No Does patient have Intensive In-House Services?  : No Does patient have [facility] services? : No Does patient have P4CC services?: No  Past Medical History:  Past Medical History:  Diagnosis Date  . Anxiety   . Arthritis   . CAD (coronary artery disease)    Moderate LAD disease 2016 - Dr. Jacinto HalimGanji  . Chronic bronchitis (HCC)   . Chronic upper back pain   . COPD (chronic obstructive pulmonary disease) (HCC)   . Depression   . Diabetic peripheral neuropathy (HCC)   . GERD (gastroesophageal reflux disease)   . History of gout   . Hyperlipemia   . Hypertension   . Migraine   . Noncompliance   . 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 Sleep apnea 2016  . Type 2 diabetes mellitus (HCC)     Past Surgical History:  Procedure Laterality Date  . ANKLE SURGERY Right 1982   "had extra bones in there; took them out"  . APPENDECTOMY  1975  . CARDIAC CATHETERIZATION N/A 03/28/2015   Procedure: Left Heart Cath and Coronary Angiography;  Surgeon: Yates DecampJay Ganji, MD;  [facility];  Service: Cardiovascular;  Laterality: N/A;  . CARDIAC CATHETERIZATION N/A 03/28/2015   Procedure: Intravascular Pressure Wire/FFR Study;  Surgeon: Yates DecampJay Ganji, MD;  [facility];  Service: Cardiovascular;  Laterality: N/A;  . CARPAL TUNNEL RELEASE Left ~ 2008  . COLONOSCOPY WITH ESOPHAGOGASTRODUODENOSCOPY (EGD)    . ELBOW FRACTURE SURGERY Left ~ 2008  . FRACTURE SURGERY    . PILONIDAL CYST EXCISION N/A 03/24/2017   Procedure: EXCISION CHRONIC  PILONIDAL ABSCESS;   Surgeon: [name] MiyamotoBlackman, Douglas, MD;  Location: [facility] ORS;  Service: General;  Laterality: N/A;  . TENDON REPAIR Left ~ 2004   "main tendon in my ankle"   Family History:  Family History  Problem Relation Age of Onset  . Congestive Heart Failure Sister   . Diabetes Mother   . Hypertension Mother   . Diabetes Father   . Hypertension Father    Family Psychiatric  History: Denies Social History:  Social History   Substance and Sexual Activity  Alcohol Use Never  . Alcohol/week: 0.0 standard drinks  . Frequency: Never     Social History   Substance and Sexual Activity  Drug Use Never    Social History   Socioeconomic History  . Marital status: Single    Spouse name: Not on file  . Number of children: 0  . Years of education: 10th g  . Highest education level: Not on file  Occupational History  . 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  Social Needs  . Financial resource strain: Not on file  . Food insecurity    Worry: Not on file    Inability: Not on file  .  Transportation needs    Medical: Not on file    Non-medical: Not on file  Tobacco Use  . Smoking status: Current Every Day Smoker    Packs/day: 0.50    Years: 34.00    Pack years: 17.00    Types: Cigarettes  . Smokeless tobacco: Never Used  Substance and Sexual Activity  . Alcohol use: Never    Alcohol/week: 0.0 standard drinks    Frequency: Never  . Drug use: Never  . Sexual activity: Not Currently  Lifestyle  . Physical activity    Days per week: Not on file    Minutes per session: Not on file  . Stress: Not on file  Relationships  . Social Herbalist on phone: Not on file    Gets together: Not on file    Attends religious service: Not on file    Active member of club or organization: Not on file    Attends meetings of clubs or organizations: Not on file    Relationship status: Not on file  Other Topics Concern  . Not on file  Social History Narrative  . Not on file   Additional Social  History:    Allergies:   Allergies  Allergen Reactions  . Omeprazole Magnesium Swelling    Face swells, no breathing impairment  . Esomeprazole Swelling    Face swells, no breathing impairment    Labs:  Results for orders placed or performed during the hospital encounter of 12/16/18 (from the past 48 hour(s))  CBG monitoring, ED     Status: Abnormal   Collection Time: 12/16/18  2:26 PM  Result Value Ref Range   Glucose-Capillary 106 (H) 70 - 99 mg/dL  SARS Coronavirus 2 (CEPHEID- Performed in [place] hospital lab), Hosp Order     Status: None   Collection Time: 12/16/18  2:34 PM   Specimen: Nasopharyngeal Swab  Result Value Ref Range   SARS Coronavirus 2 NEGATIVE NEGATIVE    Comment: (NOTE) If result is NEGATIVE SARS-CoV-2 target nucleic acids are NOT DETECTED. The SARS-CoV-2 RNA is generally detectable in upper and lower  respiratory specimens during the acute phase of infection. The lowest  concentration of SARS-CoV-2 viral copies this assay can detect is 250  copies / mL. A negative result does not preclude SARS-CoV-2 infection  and should not be used as the sole basis for treatment or other  patient management decisions.  A negative result may occur with  improper specimen collection / handling, submission of specimen other  than nasopharyngeal swab, presence of viral mutation(s) within the  areas targeted by this assay, and inadequate number of viral copies  (<250 copies / mL). A negative result must be combined with clinical  observations, patient history, and epidemiological information. If result is POSITIVE SARS-CoV-2 target nucleic acids are DETECTED. The SARS-CoV-2 RNA is generally detectable in upper and lower  respiratory specimens dur ing the acute phase of infection.  Positive  results are indicative of active infection with SARS-CoV-2.  Clinical  correlation with patient history and other diagnostic information is  necessary to determine patient  infection status.  Positive results do  not rule out bacterial infection or co-infection with other viruses. If result is PRESUMPTIVE POSTIVE SARS-CoV-2 nucleic acids MAY BE PRESENT.   A presumptive positive result was obtained on the submitted specimen  and confirmed on repeat testing.  While 2019 novel coronavirus  (SARS-CoV-2) nucleic acids may be present in the submitted sample  additional confirmatory  testing may be necessary for epidemiological  and / or clinical management purposes  to differentiate between  SARS-CoV-2 and other Sarbecovirus currently known to infect humans.  If clinically indicated additional testing with an alternate test  methodology [phone]) is advised. The SARS-CoV-2 RNA is generally  detectable in upper and lower respiratory sp ecimens during the acute  phase of infection. The expected result is Negative. Fact Sheet for Patients:  BoilerBrush.com.cy Fact Sheet for Healthcare Providers: https://pope.com/ This test is not yet approved or cleared by the [place] FDA and has been authorized for detection and/or diagnosis of SARS-CoV-2 by FDA under an Emergency Use Authorization (EUA).  This EUA will remain in effect (meaning this test can be used) for the duration of the COVID-19 declaration under Section 564(b)(1) of the Act, 21 U.S.C. section 360bbb-3(b)(1), unless the authorization is terminated or revoked sooner. Performed at [facility], [address] [place], [place] [address]   CBC with Differential/Platelet     Status: Abnormal   Collection Time: 12/16/18  3:06 PM  Result Value Ref Range   WBC 16.1 (H) 4.0 - 10.5 K/uL   RBC 5.26 4.22 - 5.81 MIL/uL   Hemoglobin 16.2 13.0 - 17.0 g/dL   HCT 14.7 82.9 - 56.2 %   MCV 93.2 80.0 - 100.0 fL   MCH 30.8 26.0 - 34.0 pg   MCHC 33.1 30.0 - 36.0 g/dL   RDW 13.0 86.5 - 78.4 %   Platelets 289 150 - 400 K/uL   nRBC 0.0 0.0 - 0.2 %   Neutrophils Relative %  73 %   Neutro Abs 11.6 (H) 1.7 - 7.7 K/uL   Lymphocytes Relative 22 %   Lymphs Abs 3.5 0.7 - 4.0 K/uL   Monocytes Relative 5 %   Monocytes Absolute 0.7 0.1 - 1.0 K/uL   Eosinophils Relative 0 %   Eosinophils Absolute 0.1 0.0 - 0.5 K/uL   Basophils Relative 0 %   Basophils Absolute 0.1 0.0 - 0.1 K/uL   Immature Granulocytes 0 %   Abs Immature Granulocytes 0.05 0.00 - 0.07 K/uL    Comment: Performed at [facility], [address] [place], [place] [address]  Comprehensive metabolic panel     Status: Abnormal   Collection Time: 12/16/18  3:06 PM  Result Value Ref Range   Sodium 133 (L) 135 - 145 mmol/L   Potassium 4.5 3.5 - 5.1 mmol/L   Chloride 93 (L) 98 - 111 mmol/L   CO2 21 (L) 22 - 32 mmol/L   Glucose, Bld 97 70 - 99 mg/dL   BUN 38 (H) 6 - 20 mg/dL   Creatinine, Ser 5.28 (H) 0.61 - 1.24 mg/dL   Calcium 9.1 8.9 - 41.3 mg/dL   Total Protein 7.2 6.5 - 8.1 g/dL   Albumin 4.1 3.5 - 5.0 g/dL   AST 15 15 - 41 U/L   ALT 13 0 - 44 U/L   Alkaline Phosphatase 82 38 - 126 U/L   Total Bilirubin 1.8 (H) 0.3 - 1.2 mg/dL   GFR calc non Af Amer 27 (L) >60 mL/min   GFR calc Af Amer 32 (L) >60 mL/min   Anion gap 19 (H) 5 - 15    Comment: Performed at [facility], [address], [place], [place] [address]  Lipase, blood     Status: None   Collection Time: 12/16/18  3:06 PM  Result Value Ref Range   Lipase 20 11 - 51 U/L    Comment: Performed  at [facility], [address], [place], [place] [address]  Lactic acid, plasma     Status: None   Collection Time: 12/16/18  3:06 PM  Result Value Ref Range   Lactic Acid, Venous 1.5 0.5 - 1.9 mmol/L    Comment: Performed at [facility], [address], [place], [place] [address]  Magnesium     Status: None   Collection Time: 12/16/18  3:06 PM  Result Value Ref Range   Magnesium 1.9 1.7 - 2.4 mg/dL    Comment: Performed at [facility], [address] [place], [place] [address]  Troponin I (High Sensitivity)     Status: None   Collection Time:  12/16/18  3:06 PM  Result Value Ref Range   Troponin I (High Sensitivity) 8.00 <18 ng/L    Comment: (NOTE) Elevated high sensitivity troponin I (hsTnI) values and significant  changes across serial measurements may suggest ACS but many other  chronic and acute conditions are known to elevate hsTnI results.  Refer to the "Links" section for chest pain algorithms and additional  guidance. Performed at [facility], [address] [place], [place] [address]   Acetaminophen level     Status: Abnormal   Collection Time: 12/16/18  3:08 PM  Result Value Ref Range   Acetaminophen (Tylenol), Serum <10 (L) 10 - 30 ug/mL    Comment: (NOTE) Therapeutic concentrations vary significantly. A range of 10-30 ug/mL  may be an effective concentration for many patients. However, some  are best treated at concentrations outside of this range. Acetaminophen concentrations >150 ug/mL at 4 hours after ingestion  and >50 ug/mL at 12 hours after ingestion are often associated with  toxic reactions. Performed at [facility], [address], [place], [place] [address]   Salicylate level     Status: None   Collection Time: 12/16/18  3:08 PM  Result Value Ref Range   Salicylate Lvl <7.0 2.8 - 30.0 mg/dL    Comment: Performed at [facility], [address] [place], [place] [address]  Ethanol     Status: None   Collection Time: 12/16/18  3:08 PM  Result Value Ref Range   Alcohol, Ethyl (B) <10 <10 mg/dL    Comment: (NOTE) Lowest detectable limit for serum alcohol is 10 mg/dL. For medical purposes only. Performed at [facility], [address] [place], [place] [address]   Troponin I (High Sensitivity)     Status: None   Collection Time: 12/16/18  4:32 PM  Result Value Ref Range   Troponin I (High Sensitivity) 8.00 <18 ng/L    Comment: (NOTE) Elevated high sensitivity troponin I (hsTnI) values and significant  changes across serial measurements may suggest ACS but many other  chronic and acute  conditions are known to elevate hsTnI results.  Refer to the "Links" section for chest pain algorithms and additional  guidance. Performed at [facility], [address], [place], [place] [address]   Urinalysis, Routine w reflex microscopic     Status: Abnormal   Collection Time: 12/16/18  6:10 PM  Result Value Ref Range   Color, Urine YELLOW YELLOW   APPearance HAZY (A) CLEAR   Specific Gravity, Urine 1.015 1.005 - 1.030   pH 5.0 5.0 - 8.0   Glucose, UA NEGATIVE NEGATIVE mg/dL   Hgb urine dipstick SMALL (A) NEGATIVE   Bilirubin Urine NEGATIVE NEGATIVE   Ketones, ur 20 (A) NEGATIVE mg/dL   Protein, ur 30 (A) NEGATIVE mg/dL   Nitrite NEGATIVE NEGATIVE  Leukocytes,Ua TRACE (A) NEGATIVE   RBC / HPF 0-5 0 - 5 RBC/hpf   WBC, UA 6-10 0 - 5 WBC/hpf   Bacteria, UA RARE (A) NONE SEEN   Squamous Epithelial / LPF 0-5 0 - 5   Mucus PRESENT    Hyaline Casts, UA PRESENT     Comment: Performed at [facility], [address] [place], [place] [address]  Rapid urine drug screen (hospital performed)     Status: None   Collection Time: 12/16/18  6:10 PM  Result Value Ref Range   Opiates NONE DETECTED NONE DETECTED   Cocaine NONE DETECTED NONE DETECTED   Benzodiazepines NONE DETECTED NONE DETECTED   Amphetamines NONE DETECTED NONE DETECTED   Tetrahydrocannabinol NONE DETECTED NONE DETECTED   Barbiturates NONE DETECTED NONE DETECTED    Comment: (NOTE) DRUG SCREEN FOR MEDICAL PURPOSES ONLY.  IF CONFIRMATION IS NEEDED FOR ANY PURPOSE, NOTIFY LAB WITHIN 5 DAYS. LOWEST DETECTABLE LIMITS FOR URINE DRUG SCREEN Drug Class                     Cutoff (ng/mL) Amphetamine and metabolites    1000 Barbiturate and metabolites    200 Benzodiazepine                 200 Tricyclics and metabolites     300 Opiates and metabolites        300 Cocaine and metabolites        300 THC                            50 Performed at [facility], [address] [place], [place] [address]   Comprehensive metabolic  panel     Status: Abnormal   Collection Time: 12/16/18  7:02 PM  Result Value Ref Range   Sodium 135 135 - 145 mmol/L   Potassium 4.3 3.5 - 5.1 mmol/L   Chloride 101 98 - 111 mmol/L   CO2 20 (L) 22 - 32 mmol/L   Glucose, Bld 77 70 - 99 mg/dL   BUN 35 (H) 6 - 20 mg/dL   Creatinine, Ser 3.472.06 (H) 0.61 - 1.24 mg/dL   Calcium 8.0 (L) 8.9 - 10.3 mg/dL   Total Protein 6.6 6.5 - 8.1 g/dL   Albumin 3.7 3.5 - 5.0 g/dL   AST 15 15 - 41 U/L   ALT 11 0 - 44 U/L   Alkaline Phosphatase 74 38 - 126 U/L   Total Bilirubin 1.7 (H) 0.3 - 1.2 mg/dL   GFR calc non Af Amer 37 (L) >60 mL/min   GFR calc Af Amer 43 (L) >60 mL/min   Anion gap 14 5 - 15    Comment: Performed at [facility], [address] [place], [place] [address]  CBG monitoring, ED     Status: None   Collection Time: 12/17/18  1:10 AM  Result Value Ref Range   Glucose-Capillary 99 70 - 99 mg/dL  Comprehensive metabolic panel     Status: Abnormal   Collection Time: 12/17/18  4:46 AM  Result Value Ref Range   Sodium 137 135 - 145 mmol/L   Potassium 5.0 3.5 - 5.1 mmol/L   Chloride 99 98 - 111 mmol/L   CO2 22 22 - 32 mmol/L   Glucose, Bld 153 (H) 70 - 99 mg/dL   BUN 28 (H) 6 - 20 mg/dL   Creatinine, Ser 6.381.59 (H) 0.61 - 1.24 mg/dL  Calcium 8.5 (L) 8.9 - 10.3 mg/dL   Total Protein 6.6 6.5 - 8.1 g/dL   Albumin 3.6 3.5 - 5.0 g/dL   AST 13 (L) 15 - 41 U/L   ALT 10 0 - 44 U/L   Alkaline Phosphatase 72 38 - 126 U/L   Total Bilirubin 2.0 (H) 0.3 - 1.2 mg/dL   GFR calc non Af Amer 50 (L) >60 mL/min   GFR calc Af Amer 58 (L) >60 mL/min   Anion gap 16 (H) 5 - 15    Comment: Performed at [facility], [address], [place], [place] [address]  CBG monitoring, ED     Status: Abnormal   Collection Time: 12/17/18  5:36 AM  Result Value Ref Range   Glucose-Capillary 170 (H) 70 - 99 mg/dL  CBG monitoring, ED     Status: Abnormal   Collection Time: 12/17/18  8:09 AM  Result Value Ref Range   Glucose-Capillary 121 (H) 70 - 99 mg/dL     Medications:  Current Facility-Administered Medications  Medication Dose Route Frequency Provider Last Rate Last Dose  . 0.9 %  sodium chloride infusion   Intravenous Continuous Samuel Jester, DO 150 mL/hr at 12/16/18 2049 150 mL/hr at 12/16/18 2049  . acetaminophen (TYLENOL) tablet 650 mg  650 mg Oral Once Samuel Jester, DO      . acetaminophen (TYLENOL) tablet 650 mg  650 mg Oral Q6H PRN Samuel Jester, DO   650 mg at 12/17/18 0942  . ARIPiprazole (ABILIFY) tablet 2 mg  2 mg Oral Daily Samuel Jester, DO   2 mg at 12/16/18 2051  . aspirin EC tablet 81 mg  81 mg Oral QHS Samuel Jester, DO   81 mg at 12/17/18 0133  . atorvastatin (LIPITOR) tablet 80 mg  80 mg Oral Daily Samuel Jester, DO   80 mg at 12/17/18 0943  . buPROPion (WELLBUTRIN SR) 12 hr tablet 300 mg  300 mg Oral Daily Samuel Jester, DO      . busPIRone (BUSPAR) tablet 5 mg  5 mg Oral TID Samuel Jester, DO   5 mg at 12/17/18 0236  . FLUoxetine (PROZAC) capsule 40 mg  40 mg Oral q morning - 10a Samuel Jester, DO   40 mg at 12/17/18 0943  . insulin aspart (novoLOG) injection 0-9 Units  0-9 Units Subcutaneous Q4H Samuel Jester, DO   1 Units at 12/17/18 0817  . insulin glargine (LANTUS) injection 40 Units  40 Units Subcutaneous QHS Samuel Jester, DO      . isosorbide mononitrate (IMDUR) 24 hr tablet 30 mg  30 mg Oral Daily Samuel Jester, DO      . metFORMIN (GLUCOPHAGE) tablet 1,000 mg  1,000 mg Oral BID WC Samuel Jester, DO   1,000 mg at 12/17/18 0818  . metoprolol tartrate (LOPRESSOR) tablet 25 mg  25 mg Oral BID Samuel Jester, DO   25 mg at 12/17/18 6045   Current Outpatient Medications  Medication Sig Dispense Refill  . acetaminophen (TYLENOL) 325 MG tablet Take 2 tablets (650 mg total) by mouth every 6 (six) hours as needed for moderate pain.    Marland Kitchen aspirin 81 MG tablet Take 81 mg by mouth at bedtime.     Marland Kitchen atorvastatin (LIPITOR) 80 MG tablet Take 1 tablet (80 mg total) by  mouth daily. 90 tablet 1  . buPROPion (WELLBUTRIN SR) 150 MG 12 hr tablet Take 300 mg by mouth daily.    . busPIRone (BUSPAR) 5 MG tablet Take 5  mg by mouth 3 (three) times daily.    . insulin aspart (NOVOLOG) 100 UNIT/ML injection Before each meal 3 times a day, 140-199 - 2 units, 200-250 - 4 units, 251-299 - 6 units,  300-349 - 8 units,  350 or above 10 units. Dispense syringes and needles as needed, Ok to switch to PEN if approved. Substitute to any brand approved. DX DM2, Code E11.65 1 vial 0  . Insulin Glargine (LANTUS SOLOSTAR) 100 UNIT/ML Solostar Pen Inject 40 Units into the skin at bedtime. 1 pen 3  . isosorbide mononitrate (IMDUR) 30 MG 24 hr tablet Take 1 tablet (30 mg total) by mouth daily. 90 tablet 3  . metFORMIN (GLUCOPHAGE) 1000 MG tablet Take 1 tablet (1,000 mg total) by mouth 2 (two) times daily with a meal. 180 tablet 1  . metoprolol tartrate (LOPRESSOR) 25 MG tablet Take 25 mg by mouth 2 (two) times daily.     . nitroGLYCERIN (NITROSTAT) 0.4 MG SL tablet Place 1 tablet (0.4 mg total) under the tongue every 5 (five) minutes as needed for chest pain. 25 tablet 3  . promethazine (PHENERGAN) 25 MG suppository Place 1 suppository (25 mg total) rectally every 6 (six) hours as needed for nausea or vomiting. 12 each 0  . PROZAC 40 MG capsule Take 40 mg by mouth every morning.    . ARIPiprazole (ABILIFY) 2 MG tablet Take 2 mg by mouth daily.    . famotidine (PEPCID) 40 MG tablet Take 1 tablet (40 mg total) by mouth 2 (two) times daily. (Patient not taking: Reported on 11/13/2018) 60 tablet 4  . gabapentin (NEURONTIN) 300 MG capsule Take 1 capsule (300 mg total) by mouth 2 (two) times daily. (Patient not taking: Reported on 12/16/2018) 60 capsule 2    Musculoskeletal: Strength & Muscle Tone: within normal limits Gait & Station: Remained in the bed during evaluation Patient leans: N/A  Psychiatric Specialty Exam: Physical Exam  Nursing note and vitals reviewed. Constitutional: He is  oriented to person, place, and time. He appears well-developed and well-nourished.  Respiratory: Effort normal.  Musculoskeletal: Normal range of motion.  Neurological: He is alert and oriented to person, place, and time.    Review of Systems  Constitutional: Negative.   HENT: Negative.   Eyes: Negative.   Respiratory: Negative.   Cardiovascular: Negative.   Gastrointestinal: Negative.   Genitourinary: Negative.   Musculoskeletal: Negative.   Skin: Negative.   Neurological: Negative.   Endo/Heme/Allergies: Negative.   Psychiatric/Behavioral: Positive for depression. Negative for hallucinations and suicidal ideas.    Blood pressure (!) 138/94, pulse (!) 102, temperature 98.4 F (36.9 C), temperature source Oral, resp. rate 18, height  (1.727 m), weight (!) 152 kg, SpO2 95 %.Body mass index is 50.95 kg/m.  General Appearance: Casual  Eye Contact:  Good  Speech:  Clear and Coherent and Normal Rate  Volume:  Normal  Mood:  Depressed  Affect:  Congruent  Thought Process:  Coherent and Descriptions of Associations: Intact  Orientation:  Full (Time, Place, and Person)  Thought Content:  WDL  Suicidal Thoughts:  No  Homicidal Thoughts:  No  Memory:  Immediate;   Good Recent;   Good Remote;   Good  Judgement:  Fair  Insight:  Fair  Psychomotor Activity:  Normal  Concentration:  Concentration: Good and Attention Span: Good  Recall:  Good  Fund of Knowledge:  Good  Language:  Good  Akathisia:  No  Handed:  Right  AIMS (if indicated):  Assets:  Communication Skills Desire for Improvement Housing Resilience Social Support Transportation  ADL's:  Intact  Cognition:  WNL  Sleep:        Treatment Plan Summary: Follow up with outpatient provider  Discharge pending collateral and safety plan from mother  Provide additional outpatient resources  Disposition: No evidence of imminent risk to self or others at present.   Patient does not meet criteria for psychiatric  inpatient admission. Supportive therapy provided about ongoing stressors. Discussed crisis plan, support from social network, calling 911, coming to the Emergency Department, and calling Suicide Hotline.  This service was provided via telemedicine using a 2-way, interactive audio and video technology.  Names of all persons participating in this telemedicine service and their role in this encounter. Name: Camdon Gallo Role: Patient  Name: Reola Calkins NP Role: Provider  Name:  Role:   Name:  Role:     Maryfrances Bunnell, FNP 12/17/2018 9:57 AM

## 2018-12-17 NOTE — ED Notes (Signed)
Pt given belongings from locker.

## 2018-12-17 NOTE — ED Notes (Signed)
Spoke with patient's mother and provided her with Hedy Camara, CSW, phone number to discuss implementing safety plan upon pt d/c

## 2018-12-17 NOTE — Progress Notes (Signed)
CSW faxed over outpatient resources for patient in case of discharge (pending collateral and safety plan from mother).  Chalmers Guest. Guerry Bruin, MSW, [place] Work/Disposition Phone: [phone] Fax: [phone]

## 2018-12-17 NOTE — Discharge Instructions (Signed)
Your testing was reassuring, you will need to follow-up with your doctor for further testing in 3 days to make sure that you are still doing well.  Please drink plenty of fluids, I have prescribed Zofran as needed for nausea, you may take this every 6 hours as needed.

## 2018-12-18 LAB — URINE CULTURE: Culture: 70000 — AB

## 2018-12-19 ENCOUNTER — Encounter (HOSPITAL_COMMUNITY): Payer: Self-pay | Admitting: Emergency Medicine

## 2018-12-19 ENCOUNTER — Observation Stay (HOSPITAL_COMMUNITY)
Admission: EM | Admit: 2018-12-19 | Discharge: 2018-12-20 | Disposition: A | Discharge status: Home / Self Care | Payer: Medicaid Other | Attending: Internal Medicine | Admitting: Internal Medicine

## 2018-12-19 ENCOUNTER — Other Ambulatory Visit: Payer: Self-pay

## 2018-12-19 ENCOUNTER — Telehealth: Payer: Self-pay | Admitting: Emergency Medicine

## 2018-12-19 DIAGNOSIS — E872 Acidosis, unspecified: Secondary | ICD-10-CM

## 2018-12-19 DIAGNOSIS — E785 Hyperlipidemia, unspecified: Secondary | ICD-10-CM | POA: Insufficient documentation

## 2018-12-19 DIAGNOSIS — F419 Anxiety disorder, unspecified: Secondary | ICD-10-CM | POA: Insufficient documentation

## 2018-12-19 DIAGNOSIS — F329 Major depressive disorder, single episode, unspecified: Secondary | ICD-10-CM | POA: Insufficient documentation

## 2018-12-19 DIAGNOSIS — F17210 Nicotine dependence, cigarettes, uncomplicated: Secondary | ICD-10-CM | POA: Insufficient documentation

## 2018-12-19 DIAGNOSIS — I129 Hypertensive chronic kidney disease with stage 1 through stage 4 chronic kidney disease, or unspecified chronic kidney disease: Secondary | ICD-10-CM | POA: Insufficient documentation

## 2018-12-19 DIAGNOSIS — M545 Low back pain, unspecified: Secondary | ICD-10-CM

## 2018-12-19 DIAGNOSIS — E1110 Type 2 diabetes mellitus with ketoacidosis without coma: Secondary | ICD-10-CM

## 2018-12-19 DIAGNOSIS — E1143 Type 2 diabetes mellitus with diabetic autonomic (poly)neuropathy: Secondary | ICD-10-CM | POA: Insufficient documentation

## 2018-12-19 DIAGNOSIS — N183 Chronic kidney disease, stage 3 (moderate): Secondary | ICD-10-CM | POA: Insufficient documentation

## 2018-12-19 DIAGNOSIS — Z79899 Other long term (current) drug therapy: Secondary | ICD-10-CM | POA: Insufficient documentation

## 2018-12-19 DIAGNOSIS — R112 Nausea with vomiting, unspecified: Secondary | ICD-10-CM | POA: Diagnosis present

## 2018-12-19 DIAGNOSIS — E1121 Type 2 diabetes mellitus with diabetic nephropathy: Principal | ICD-10-CM | POA: Insufficient documentation

## 2018-12-19 DIAGNOSIS — E1165 Type 2 diabetes mellitus with hyperglycemia: Secondary | ICD-10-CM | POA: Insufficient documentation

## 2018-12-19 DIAGNOSIS — Z03818 Encounter for observation for suspected exposure to other biological agents ruled out: Secondary | ICD-10-CM | POA: Insufficient documentation

## 2018-12-19 DIAGNOSIS — I2510 Atherosclerotic heart disease of native coronary artery without angina pectoris: Secondary | ICD-10-CM | POA: Insufficient documentation

## 2018-12-19 DIAGNOSIS — Z7982 Long term (current) use of aspirin: Secondary | ICD-10-CM | POA: Insufficient documentation

## 2018-12-19 DIAGNOSIS — Z794 Long term (current) use of insulin: Secondary | ICD-10-CM | POA: Insufficient documentation

## 2018-12-19 DIAGNOSIS — J449 Chronic obstructive pulmonary disease, unspecified: Secondary | ICD-10-CM | POA: Insufficient documentation

## 2018-12-19 DIAGNOSIS — K3184 Gastroparesis: Secondary | ICD-10-CM

## 2018-12-19 LAB — CBC WITH DIFFERENTIAL/PLATELET
Abs Immature Granulocytes: 0.04 10*3/uL (ref 0.00–0.07)
Basophils Absolute: 0.1 10*3/uL (ref 0.0–0.1)
Basophils Relative: 0 %
Eosinophils Absolute: 0.1 10*3/uL (ref 0.0–0.5)
Eosinophils Relative: 1 %
HCT: 46.9 % (ref 39.0–52.0)
Hemoglobin: 16.2 g/dL (ref 13.0–17.0)
Immature Granulocytes: 0 %
Lymphocytes Relative: 16 %
Lymphs Abs: 2.2 10*3/uL (ref 0.7–4.0)
MCH: 31.2 pg (ref 26.0–34.0)
MCHC: 34.5 g/dL (ref 30.0–36.0)
MCV: 90.2 fL (ref 80.0–100.0)
Monocytes Absolute: 0.5 10*3/uL (ref 0.1–1.0)
Monocytes Relative: 4 %
Neutro Abs: 10.7 10*3/uL — ABNORMAL HIGH (ref 1.7–7.7)
Neutrophils Relative %: 79 %
Platelets: 268 10*3/uL (ref 150–400)
RBC: 5.2 MIL/uL (ref 4.22–5.81)
RDW: 11.8 % (ref 11.5–15.5)
WBC: 13.5 10*3/uL — ABNORMAL HIGH (ref 4.0–10.5)
nRBC: 0 % (ref 0.0–0.2)

## 2018-12-19 LAB — BLOOD GAS, VENOUS
Acid-base deficit: 3 mmol/L — ABNORMAL HIGH (ref 0.0–2.0)
Bicarbonate: 20.4 mmol/L (ref 20.0–28.0)
FIO2: 21
O2 Saturation: 44.6 %
Patient temperature: 37.5
pCO2, Ven: 40.3 mmHg — ABNORMAL LOW (ref 44.0–60.0)
pH, Ven: 7.349 (ref 7.250–7.430)
pO2, Ven: <31 mmHg — CL (ref 32.0–45.0)

## 2018-12-19 LAB — COMPREHENSIVE METABOLIC PANEL
ALT: 11 U/L (ref 0–44)
AST: 13 U/L — ABNORMAL LOW (ref 15–41)
Albumin: 3.9 g/dL (ref 3.5–5.0)
Alkaline Phosphatase: 79 U/L (ref 38–126)
Anion gap: 20 — ABNORMAL HIGH (ref 5–15)
BUN: 22 mg/dL — ABNORMAL HIGH (ref 6–20)
CO2: 18 mmol/L — ABNORMAL LOW (ref 22–32)
Calcium: 9.1 mg/dL (ref 8.9–10.3)
Chloride: 98 mmol/L (ref 98–111)
Creatinine, Ser: 1.32 mg/dL — ABNORMAL HIGH (ref 0.61–1.24)
GFR calc Af Amer: >60 mL/min (ref >60)
GFR calc non Af Amer: >60 mL/min (ref >60)
Glucose, Bld: 182 mg/dL — ABNORMAL HIGH (ref 70–99)
Potassium: 3.9 mmol/L (ref 3.5–5.1)
Sodium: 136 mmol/L (ref 135–145)
Total Bilirubin: 1.8 mg/dL — ABNORMAL HIGH (ref 0.3–1.2)
Total Protein: 7.2 g/dL (ref 6.5–8.1)

## 2018-12-19 LAB — OSMOLALITY: Osmolality: 294 mOsm/kg (ref 275–295)

## 2018-12-19 LAB — LIPASE, BLOOD: Lipase: 17 U/L (ref 11–51)

## 2018-12-19 LAB — CBG MONITORING, ED
Glucose-Capillary: 184 mg/dL — ABNORMAL HIGH (ref 70–99)
Glucose-Capillary: 132 mg/dL — ABNORMAL HIGH (ref 70–99)

## 2018-12-19 LAB — GLUCOSE, CAPILLARY
Glucose-Capillary: 121 mg/dL — ABNORMAL HIGH (ref 70–99)
Glucose-Capillary: 142 mg/dL — ABNORMAL HIGH (ref 70–99)
Glucose-Capillary: 162 mg/dL — ABNORMAL HIGH (ref 70–99)

## 2018-12-19 LAB — BETA-HYDROXYBUTYRIC ACID: Beta-Hydroxybutyric Acid: 6.05 mmol/L — ABNORMAL HIGH (ref 0.05–0.27)

## 2018-12-19 LAB — LACTIC ACID, PLASMA
Lactic Acid, Venous: 1.1 mmol/L (ref 0.5–1.9)
Lactic Acid, Venous: 1.2 mmol/L (ref 0.5–1.9)

## 2018-12-19 LAB — SARS CORONAVIRUS 2 BY RT PCR (HOSPITAL ORDER, PERFORMED IN ~~LOC~~ HOSPITAL LAB): SARS Coronavirus 2: NEGATIVE

## 2018-12-19 MED ORDER — HYDRALAZINE HCL 20 MG/ML IJ SOLN: 10.0000 mg | INTRAMUSCULAR | Status: DC | PRN

## 2018-12-19 MED ORDER — METOCLOPRAMIDE HCL 5 MG/ML IJ SOLN
10.0000 mg | Freq: Once | INTRAMUSCULAR | Status: AC
  Administered 2018-12-19: 06:00:00 10 mg via INTRAVENOUS
  Filled 2018-12-19: qty 2

## 2018-12-19 MED ORDER — METOCLOPRAMIDE HCL 5 MG/ML IJ SOLN
5.0000 mg | Freq: Four times a day (QID) | INTRAMUSCULAR | Status: DC
  Administered 2018-12-19 – 2018-12-20 (×4): 5 mg via INTRAVENOUS
  Filled 2018-12-19 (×4): qty 2

## 2018-12-19 MED ORDER — ACETAMINOPHEN 650 MG RE SUPP: 650.0000 mg | Freq: Four times a day (QID) | RECTAL | Status: DC | PRN

## 2018-12-19 MED ORDER — DEXTROSE-NACL 5-0.45 % IV SOLN
INTRAVENOUS | Status: DC
  Administered 2018-12-19 – 2018-12-20 (×3): via INTRAVENOUS

## 2018-12-19 MED ORDER — SODIUM CHLORIDE 0.9 % IV BOLUS
500.0000 mL | Freq: Once | INTRAVENOUS | Status: AC
  Administered 2018-12-19: 500 mL via INTRAVENOUS

## 2018-12-19 MED ORDER — ATORVASTATIN CALCIUM 40 MG PO TABS
80.0000 mg | ORAL_TABLET | Freq: Every day | ORAL | Status: DC
  Administered 2018-12-19 – 2018-12-20 (×2): 80 mg via ORAL
  Filled 2018-12-19 (×2): qty 2

## 2018-12-19 MED ORDER — ACETAMINOPHEN 325 MG PO TABS: 650.0000 mg | ORAL_TABLET | Freq: Four times a day (QID) | ORAL | Status: DC | PRN

## 2018-12-19 MED ORDER — METOPROLOL TARTRATE 25 MG PO TABS
25.0000 mg | ORAL_TABLET | Freq: Two times a day (BID) | ORAL | Status: DC
  Administered 2018-12-19 – 2018-12-20 (×3): 25 mg via ORAL
  Filled 2018-12-19 (×3): qty 1

## 2018-12-19 MED ORDER — NICOTINE 7 MG/24HR TD PT24
7.0000 mg | MEDICATED_PATCH | Freq: Every day | TRANSDERMAL | Status: DC
  Administered 2018-12-19 – 2018-12-20 (×2): 7 mg via TRANSDERMAL
  Filled 2018-12-19 (×2): qty 1

## 2018-12-19 MED ORDER — INSULIN REGULAR(HUMAN) IN NACL 100-0.9 UT/100ML-% IV SOLN
INTRAVENOUS | Status: DC
  Administered 2018-12-19: 1.2 [IU]/h via INTRAVENOUS
  Filled 2018-12-19: qty 100

## 2018-12-19 MED ORDER — ONDANSETRON HCL 4 MG/2ML IJ SOLN
4.0000 mg | Freq: Once | INTRAMUSCULAR | Status: AC
  Administered 2018-12-19: 06:00:00 4 mg via INTRAVENOUS
  Filled 2018-12-19: qty 2

## 2018-12-19 MED ORDER — NITROGLYCERIN 0.4 MG SL SUBL: 0.4000 mg | SUBLINGUAL_TABLET | SUBLINGUAL | Status: DC | PRN

## 2018-12-19 MED ORDER — ISOSORBIDE MONONITRATE ER 60 MG PO TB24
30.0000 mg | ORAL_TABLET | Freq: Every day | ORAL | Status: DC
  Administered 2018-12-19 – 2018-12-20 (×2): 30 mg via ORAL
  Filled 2018-12-19 (×2): qty 1

## 2018-12-19 MED ORDER — DIPHENHYDRAMINE HCL 50 MG/ML IJ SOLN
25.0000 mg | Freq: Once | INTRAMUSCULAR | Status: AC
  Administered 2018-12-19: 06:00:00 25 mg via INTRAVENOUS
  Filled 2018-12-19: qty 1

## 2018-12-19 MED ORDER — ASPIRIN EC 81 MG PO TBEC
81.0000 mg | DELAYED_RELEASE_TABLET | Freq: Every day | ORAL | Status: DC
  Administered 2018-12-19: 81 mg via ORAL
  Filled 2018-12-19: qty 1

## 2018-12-19 MED ORDER — HEPARIN SODIUM (PORCINE) 5000 UNIT/ML IJ SOLN
5000.0000 [IU] | Freq: Three times a day (TID) | INTRAMUSCULAR | Status: DC
  Administered 2018-12-19 – 2018-12-20 (×3): 5000 [IU] via SUBCUTANEOUS
  Filled 2018-12-19 (×3): qty 1

## 2018-12-19 MED ORDER — INSULIN ASPART 100 UNIT/ML ~~LOC~~ SOLN
0.0000 [IU] | SUBCUTANEOUS | Status: DC
  Administered 2018-12-19 (×2): 2 [IU] via SUBCUTANEOUS
  Administered 2018-12-19: 21:00:00 3 [IU] via SUBCUTANEOUS
  Administered 2018-12-20 (×2): 5 [IU] via SUBCUTANEOUS

## 2018-12-19 MED ORDER — HYDRALAZINE HCL 20 MG/ML IJ SOLN
10.0000 mg | Freq: Once | INTRAMUSCULAR | Status: AC
  Administered 2018-12-19: 11:00:00 10 mg via INTRAVENOUS
  Filled 2018-12-19: qty 1

## 2018-12-19 MED ORDER — SODIUM CHLORIDE 0.9 % IV SOLN
INTRAVENOUS | Status: DC
  Administered 2018-12-19 (×2): via INTRAVENOUS

## 2018-12-19 MED ORDER — BUSPIRONE HCL 5 MG PO TABS
5.0000 mg | ORAL_TABLET | Freq: Three times a day (TID) | ORAL | Status: DC
  Administered 2018-12-19 – 2018-12-20 (×3): 5 mg via ORAL
  Filled 2018-12-19 (×3): qty 1

## 2018-12-19 MED ORDER — POTASSIUM CHLORIDE 10 MEQ/100ML IV SOLN
10.0000 meq | INTRAVENOUS | Status: AC
  Filled 2018-12-19 (×2): qty 100

## 2018-12-19 MED ORDER — FAMOTIDINE IN NACL 20-0.9 MG/50ML-% IV SOLN
20.0000 mg | Freq: Once | INTRAVENOUS | Status: AC
  Administered 2018-12-19: 20 mg via INTRAVENOUS
  Filled 2018-12-19: qty 50

## 2018-12-19 MED ORDER — BUPROPION HCL ER (XL) 150 MG PO TB24
300.0000 mg | ORAL_TABLET | Freq: Every day | ORAL | Status: DC
  Administered 2018-12-19 – 2018-12-20 (×2): 300 mg via ORAL
  Filled 2018-12-19 (×2): qty 2

## 2018-12-19 MED ORDER — FLUOXETINE HCL 20 MG PO CAPS
40.0000 mg | ORAL_CAPSULE | Freq: Every morning | ORAL | Status: DC
  Administered 2018-12-19 – 2018-12-20 (×2): 40 mg via ORAL
  Filled 2018-12-19 (×2): qty 2

## 2018-12-19 MED ORDER — ARIPIPRAZOLE 2 MG PO TABS
2.0000 mg | ORAL_TABLET | Freq: Every day | ORAL | Status: DC
  Administered 2018-12-19 – 2018-12-20 (×2): 2 mg via ORAL
  Filled 2018-12-19 (×4): qty 1

## 2018-12-19 MED ORDER — SODIUM CHLORIDE 0.9 % IV BOLUS
1000.0000 mL | Freq: Once | INTRAVENOUS | Status: AC
  Administered 2018-12-19: 06:00:00 1000 mL via INTRAVENOUS

## 2018-12-19 MED ORDER — ONDANSETRON HCL 4 MG/2ML IJ SOLN: 4.0000 mg | Freq: Four times a day (QID) | INTRAMUSCULAR | Status: DC | PRN

## 2018-12-19 MED ORDER — ONDANSETRON HCL 4 MG PO TABS: 4.0000 mg | ORAL_TABLET | Freq: Four times a day (QID) | ORAL | Status: DC | PRN

## 2018-12-19 NOTE — ED Triage Notes (Signed)
Pt C/O chronic back pain and weakness "that has been going on for a long time." Pt also states he is "still throwing up." Pt also stating "I haven't been able to sleep in days."

## 2018-12-19 NOTE — ED Notes (Signed)
Date and time results received: 12/19/18 8:28 AM   Test: venous blood gas  Critical Value: po2 <31  Name of Provider Notified: Dr Manuella Ghazi   Orders Received? Or Actions Taken?:

## 2018-12-19 NOTE — Telephone Encounter (Signed)
Post ED Visit - Positive Culture Follow-up  Culture report reviewed by antimicrobial stewardship pharmacist: [place] Team []  Elenor Quinones, Pharm.D. []  Heide Guile, Pharm.D., BCPS AQ-ID []  Parks Neptune, Pharm.D., BCPS []  Alycia Rossetti, Pharm.D., BCPS []  [place], Pharm.D., BCPS, AAHIVP []  Legrand Como, Pharm.D., BCPS, AAHIVP []  Salome Arnt, PharmD, BCPS []  Johnnette Gourd, PharmD, BCPS [x]  Hughes Better, PharmD, BCPS []  Leeroy Cha, PharmD []  Laqueta Linden, PharmD, BCPS []  Albertina Parr, PharmD  [place] Team []  Leodis Sias, PharmD []  Lindell Spar, PharmD []  Royetta Asal, PharmD []  Graylin Shiver, Rph []  Rema Fendt) Glennon Mac, PharmD []  Arlyn Dunning, PharmD []  Netta Cedars, PharmD []  Dia Sitter, PharmD []  Leone Haven, PharmD []  Gretta Arab, PharmD []  Theodis Shove, PharmD []  Peggyann Juba, PharmD []  Reuel Boom, PharmD   Positive urine culture Treated with none, asymptomatic, no further patient follow-up is required at this time.  Hazle Nordmann 12/19/2018, 10:54 AM

## 2018-12-19 NOTE — Progress Notes (Signed)
Inpatient Diabetes Program Recommendations  AACE/ADA: New Consensus Statement on Inpatient Glycemic Control   Target Ranges:  Prepandial:   less than 140 mg/dL      Peak postprandial:   less than 180 mg/dL (1-2 hours)      Critically ill patients:  140 - 180 mg/dL  Results for [name] [name], [name] [name] (MRN [id number]) as of 12/19/2018 15:39  Ref. Range 12/19/2018 05:32 12/19/2018 09:07 12/19/2018 12:26  Glucose-Capillary Latest Ref Range: 70 - 99 mg/dL 184 (H) 132 (H) 121 (H)   Results for [name] [name], [name] [name] (MRN [id number]) as of 12/19/2018 15:39  Ref. Range 11/07/2018 07:45  Hemoglobin A1C Latest Ref Range: 4.8 - 5.6 % 12.8 (H)   Review of Glycemic Control  Diabetes history: DM2 Outpatient Diabetes medications: Lantus 40 units QHS, Novolog 0-10 units TID with meals, Metformin 1000 mg BID Current orders for Inpatient glycemic control: Novolog 0-15 units Q4H  Recommendations: Agree with current inpatient orders for glycemic control. However, if glucose becomes consistently greater than 180 mg/dl with Novolog correction, please consider ordering low dose basal insulin (Lantus 10 units Q24H).   NOTE: Noted consult. Chart reviewed. Patient was recently inpatient 11/07/18 to 11/08/18. Spoke to patient over the phone regarding DM control.  Patient states he does not have any insurance currently. Patient reports that he goes to the [facility] and he gets his insulin from the [facility] and other medications from [facility] or [facility]. Patient states that he does not have much medication left at home and will need refills on some of his medications. Patient states that he has plenty of Lantus and Novolog at home at this time. Patient states he takes Lantus 40 units QHS, Novolog 0-10 units TID when needed, and Metformin 1000 mg BID for DM control and patient states that he took Lantus 40 units last night at home.  Patient reports that his parents have to pay for his copays for medication currently because he has no  income and he has been denied any help at social services.  Patient reports checking glucose 2-3 times per day and he notes that it fluctuates from 80-300's mg/dl.  In further talking with patient he reports that if his glucose is in the 80-90's mg/dl he skips taking Lantus.  Discussed Lantus insulin in more detail to help patient understand he needs to take Lantus consistently to keep glucose controlled. Explained duration of Lantus and explained that if dose is skipped then prior Lantus dose wears off and glucose increases significantly. Explained that if he experiences any hypoglycemia then he needs to contact his provider at the [facility] for directions on Lantus dose (whether he needs to decrease the dose or not).  Discussed hypoglycemia along with proper treatment.  Prior A1C 12.8% indicating an average glucose of 321 mg/dl.  Discussed glucose and A1C goals. Encouraged patient to check glucose 4 times per day (before meals and at bedtime) and to keep a log book of glucose readings and DM medication taken which patient will need to take to doctor appointments. Explained how the doctor can use the log book to continue to make adjustments with DM medications if needed.  Patient verbalized understanding of information discussed and reports no further questions at this time related to diabetes.   Thanks, Barnie Alderman, RN, MSN, CDE Diabetes Coordinator Inpatient Diabetes Program [phone] (Team Pager)

## 2018-12-19 NOTE — ED Provider Notes (Signed)
[facility] EMERGENCY DEPARTMENT Provider Note   CSN: [id number] Arrival date & time: 12/19/18  0442  Time seen 5:30 AM  History   Chief Complaint Chief Complaint  Patient presents with  . Back Pain    HPI [name] [name] is a 48 y.o. male.     HPI patient states he had a phone call on December 29 with his disability evaluation and he had to miss it because he was having nausea and vomiting.  He states he is continued to have nausea and vomiting.  When asking what is going on today he initially states "I do not feel good".  When I asked him to clarify he states "I feel lousy".  He then starts relating he has had nausea and vomiting yesterday and the day before about 5-6 times per day.  He complains of upper abdominal pain and points to his epigastric area.  He denies fever or diarrhea.  He states he had back pain that started 2 to 3 days ago after he fell.  He indicates the lower thoracic per lumbar area.  He states he has had several falls and the last one was prior to him coming to the ED on July 18.  He states he got up to use the bathroom and a "fell out".  He states when he woke up he was on his back.  He states he is already had x-rays to evaluate the symptoms.  He also complains of a headache since he fell in the kitchen and hit his head.  He states his head hurts in the front and the back.  He states if he stands up or bends over he gets dizzy.  He states he has passed out about 5 times in the past year.  He states his CBG yesterday was 80.  He has been evaluated by neurology and cardiology for his syncopal episodes and was told it was related to his sleep apnea but he states he cannot afford the CPAP machine.  PCP Jacquelin HawkingMcElroy, Shannon, PA-C   Past Medical History:  Diagnosis Date  . Anxiety   . Arthritis   . CAD (coronary artery disease)    Moderate LAD disease 2016 - Dr. Jacinto HalimGanji  . Chronic bronchitis (HCC)   . Chronic upper back pain   . COPD (chronic obstructive pulmonary  disease) (HCC)   . Depression   . Diabetic peripheral neuropathy (HCC)   . GERD (gastroesophageal reflux disease)   . History of gout   . Hyperlipemia   . Hypertension   . Migraine   . Noncompliance   . 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 Sleep apnea 2016  . Type 2 diabetes mellitus [facility](HCC)     Patient Active Problem List   Diagnosis Date Noted  . MDD (major depressive disorder), severe (HCC) 12/17/2018  . DKA, type 2 (HCC) 11/07/2018  . High anion gap metabolic acidosis 03/22/2018  . Epigastric abdominal pain 03/22/2018  . Nausea vomiting and diarrhea 03/22/2018  . AKI (acute kidney injury) (HCC) 03/22/2018  . Uncontrolled diabetes mellitus (HCC) 03/22/2018  . CAD (coronary artery disease) 02/15/2018  . Hyperlipidemia 02/15/2018  . Tobacco abuse 02/15/2018  . Diabetic gastroparesis (HCC) 06/02/2017  . ARF (acute renal failure) (HCC) 05/30/2017  . DKA (diabetic ketoacidoses) (HCC) 10/15/2016  . Gastroesophageal reflux disease 10/15/2016  . Anxiety with depression 10/15/2016  . HTN (hypertension) 10/15/2016  . Leukocytosis 11/25/2015  . Intractable nausea and vomiting 11/25/2015  . Dental abscess 11/25/2015  .  Nausea and vomiting 11/25/2015  . Chest pain 03/27/2015    Past Surgical History:  Procedure Laterality Date  . ANKLE SURGERY Right 1982   "had extra bones in there; took them out"  . APPENDECTOMY  1975  . CARDIAC CATHETERIZATION N/A 03/28/2015   Procedure: Left Heart Cath and Coronary Angiography;  Surgeon: Yates DecampJay Ganji, MD;  [facility];  Service: Cardiovascular;  Laterality: N/A;  . CARDIAC CATHETERIZATION N/A 03/28/2015   Procedure: Intravascular Pressure Wire/FFR Study;  Surgeon: Yates DecampJay Ganji, MD;  [facility];  Service: Cardiovascular;  Laterality: N/A;  . CARPAL TUNNEL RELEASE Left ~ 2008  . COLONOSCOPY WITH ESOPHAGOGASTRODUODENOSCOPY (EGD)    . ELBOW FRACTURE SURGERY Left ~ 2008  . FRACTURE SURGERY    . PILONIDAL CYST EXCISION  N/A 03/24/2017   Procedure: EXCISION CHRONIC  PILONIDAL ABSCESS;  Surgeon: [name] MiyamotoBlackman, Douglas, MD;  Location: [facility] ORS;  Service: General;  Laterality: N/A;  . TENDON REPAIR Left ~ 2004   "main tendon in my ankle"        Home Medications    Prior to Admission medications   Medication Sig Start Date End Date Taking? Authorizing Provider  acetaminophen (TYLENOL) 325 MG tablet Take 2 tablets (650 mg total) by mouth every 6 (six) hours as needed for moderate pain. 03/25/18   Elgergawy, Leana Roeawood S, MD  ARIPiprazole (ABILIFY) 2 MG tablet Take 2 mg by mouth daily. 11/15/18   [provider]  aspirin 81 MG tablet Take 81 mg by mouth at bedtime.     [provider]  atorvastatin (LIPITOR) 80 MG tablet Take 1 tablet (80 mg total) by mouth daily. 08/16/18   Jacquelin HawkingMcElroy, Shannon, PA-C  buPROPion (WELLBUTRIN SR) 150 MG 12 hr tablet Take 300 mg by mouth daily.    [provider]  busPIRone (BUSPAR) 5 MG tablet Take 5 mg by mouth 3 (three) times daily.    [provider]  famotidine (PEPCID) 40 MG tablet Take 1 tablet (40 mg total) by mouth 2 (two) times daily. Patient not taking: Reported on 11/13/2018 08/15/18   Len BlalockSetzer, Terri L, NP  gabapentin (NEURONTIN) 300 MG capsule Take 1 capsule (300 mg total) by mouth 2 (two) times daily. Patient not taking: Reported on 12/16/2018 08/01/18   Jacquelin HawkingMcElroy, Shannon, PA-C  insulin aspart (NOVOLOG) 100 UNIT/ML injection Before each meal 3 times a day, 140-199 - 2 units, 200-250 - 4 units, 251-299 - 6 units,  300-349 - 8 units,  350 or above 10 units. Dispense syringes and needles as needed, Ok to switch to PEN if approved. Substitute to any brand approved. DX DM2, Code E11.65 03/19/18   Leroy SeaSingh, Prashant K, MD  Insulin Glargine (LANTUS SOLOSTAR) 100 UNIT/ML Solostar Pen Inject 40 Units into the skin at bedtime. 11/08/18   Vassie LollMadera, Carlos, MD  isosorbide mononitrate (IMDUR) 30 MG 24 hr tablet Take 1 tablet (30 mg total) by mouth daily. 06/14/18 12/16/18   Jonelle SidleMcDowell, Samuel G, MD  metFORMIN (GLUCOPHAGE) 1000 MG tablet Take 1 tablet (1,000 mg total) by mouth 2 (two) times daily with a meal. 08/16/18   Jacquelin HawkingMcElroy, Shannon, PA-C  metoprolol tartrate (LOPRESSOR) 25 MG tablet Take 25 mg by mouth 2 (two) times daily.     [provider]  nitroGLYCERIN (NITROSTAT) 0.4 MG SL tablet Place 1 tablet (0.4 mg total) under the tongue every 5 (five) minutes as needed for chest pain. 10/05/17   Jacquelin HawkingMcElroy, Shannon, PA-C  ondansetron (ZOFRAN ODT) 4 MG disintegrating tablet Take  1 tablet (4 mg total) by mouth every 8 (eight) hours as needed for nausea. 12/17/18   Eber HongMiller, Brian, MD  promethazine (PHENERGAN) 25 MG suppository Place 1 suppository (25 mg total) rectally every 6 (six) hours as needed for nausea or vomiting. 11/09/18   Bethann BerkshireZammit, Joseph, MD  PROZAC 40 MG capsule Take 40 mg by mouth every morning. 10/13/18   [provider]    Family History Family History  Problem Relation Age of Onset  . Congestive Heart Failure Sister   . Diabetes Mother   . Hypertension Mother   . Diabetes Father   . Hypertension Father     Social History Social History   Tobacco Use  . Smoking status: Current Every Day Smoker    Packs/day: 0.50    Years: 34.00    Pack years: 17.00    Types: Cigarettes  . Smokeless tobacco: Never Used  Substance Use Topics  . Alcohol use: Never    Alcohol/week: 0.0 standard drinks    Frequency: Never  . Drug use: Never     Allergies   Omeprazole magnesium and Esomeprazole   Review of Systems Review of Systems  All other systems reviewed and are negative.    Physical Exam Updated Vital Signs BP (!) 132/96 (BP Location: Right Arm)   Pulse (!) 110   Temp 98.2 F (36.8 C) (Oral)   Resp 17   SpO2 100%   Vital signs normal except tachycardia  Physical Exam Constitutional:      Appearance: Normal appearance. He is normal weight.  HENT:     Head: Normocephalic and atraumatic.     Right Ear: External ear normal.      Left Ear: External ear normal.     Nose: Nose normal.     Mouth/Throat:     Mouth: Mucous membranes are dry.  Eyes:     Extraocular Movements: Extraocular movements intact.     Conjunctiva/sclera: Conjunctivae normal.     Pupils: Pupils are equal, round, and reactive to light.  Neck:     Musculoskeletal: Normal range of motion and neck supple.  Cardiovascular:     Rate and Rhythm: Tachycardia present.     Heart sounds: Normal heart sounds.  Pulmonary:     Effort: Pulmonary effort is normal. No respiratory distress.     Breath sounds: Normal breath sounds.  Abdominal:     General: Abdomen is flat.     Palpations: Abdomen is soft.     Tenderness: There is abdominal tenderness.     Comments: Patient points to the epigastric area as to where he has pain however he is tender mildly diffusely.  Musculoskeletal: Normal range of motion.        General: No swelling or deformity.  Skin:    General: Skin is warm and dry.     Capillary Refill: Capillary refill takes less than 2 seconds.     Findings: No erythema or rash.  Neurological:     General: No focal deficit present.     Mental Status: He is alert and oriented to person, place, and time.     Cranial Nerves: No cranial nerve deficit.  Psychiatric:        Mood and Affect: Affect is labile.        Speech: Speech is rapid and pressured.        Behavior: Behavior is agitated and aggressive.      ED Treatments / Results  Labs (all labs ordered are listed, but  only abnormal results are displayed) Results for orders placed or performed during the hospital encounter of 12/19/18  Comprehensive metabolic panel  Result Value Ref Range   Sodium 136 135 - 145 mmol/L   Potassium 3.9 3.5 - 5.1 mmol/L   Chloride 98 98 - 111 mmol/L   CO2 18 (L) 22 - 32 mmol/L   Glucose, Bld 182 (H) 70 - 99 mg/dL   BUN 22 (H) 6 - 20 mg/dL   Creatinine, Ser 1.61 (H) 0.61 - 1.24 mg/dL   Calcium 9.1 8.9 - 09.6 mg/dL   Total Protein 7.2 6.5 - 8.1 g/dL    Albumin 3.9 3.5 - 5.0 g/dL   AST 13 (L) 15 - 41 U/L   ALT 11 0 - 44 U/L   Alkaline Phosphatase 79 38 - 126 U/L   Total Bilirubin 1.8 (H) 0.3 - 1.2 mg/dL   GFR calc non Af Amer >60 >60 mL/min   GFR calc Af Amer >60 >60 mL/min   Anion gap 20 (H) 5 - 15  Lactic acid, plasma  Result Value Ref Range   Lactic Acid, Venous 1.1 0.5 - 1.9 mmol/L  CBC with Differential  Result Value Ref Range   WBC 13.5 (H) 4.0 - 10.5 K/uL   RBC 5.20 4.22 - 5.81 MIL/uL   Hemoglobin 16.2 13.0 - 17.0 g/dL   HCT 04.5 40.9 - 81.1 %   MCV 90.2 80.0 - 100.0 fL   MCH 31.2 26.0 - 34.0 pg   MCHC 34.5 30.0 - 36.0 g/dL   RDW 91.4 78.2 - 95.6 %   Platelets 268 150 - 400 K/uL   nRBC 0.0 0.0 - 0.2 %   Neutrophils Relative % 79 %   Neutro Abs 10.7 (H) 1.7 - 7.7 K/uL   Lymphocytes Relative 16 %   Lymphs Abs 2.2 0.7 - 4.0 K/uL   Monocytes Relative 4 %   Monocytes Absolute 0.5 0.1 - 1.0 K/uL   Eosinophils Relative 1 %   Eosinophils Absolute 0.1 0.0 - 0.5 K/uL   Basophils Relative 0 %   Basophils Absolute 0.1 0.0 - 0.1 K/uL   Immature Granulocytes 0 %   Abs Immature Granulocytes 0.04 0.00 - 0.07 K/uL  Lipase, blood  Result Value Ref Range   Lipase 17 11 - 51 U/L  POC CBG, ED  Result Value Ref Range   Glucose-Capillary 184 (H) 70 - 99 mg/dL   Laboratory interpretation all normal except low bicarb with positive anion gap, minimally elevated glucose.   EKG None  Radiology No results found.   Dg Chest 1 View  Result Date: 12/16/2018 CLINICAL DATA:  Decreased appetite with weakness since 11/27/2018. Vomiting. Back pain since fall 3 days ago. Marland Kitchen IMPRESSION: No acute findings. Electronically Signed   By: Elberta Fortis M.D.   On: 12/16/2018 15:56   Dg Lumbar Spine Complete  Result Date: 12/16/2018 CLINICAL DATA:  48 year old presenting with acute low back pain related to recent falls.  IMPRESSION: 1. Normal lumbar spine. 2. Degenerative disc disease and spondylosis involving the visualized lower thoracic spine. 3.   Aortic Atherosclerosis (ICD10-170.0) Electronically Signed   By: Hulan Saas M.D.   On: 12/16/2018 16:06   Ct Head Wo Contrast  Result Date: 12/16/2018 CLINICAL DATA:  Head trauma  IMPRESSION: No acute intracranial abnormality. Electronically Signed   By: Katherine Mantle M.D.   On: 12/16/2018 18:24     Procedures .Critical Care Performed by: Devoria Albe, MD Authorized by: Devoria Albe, MD  Critical care provider statement:    Critical care time (minutes):  35   Critical care was necessary to treat or prevent imminent or life-threatening deterioration of the following conditions:  Endocrine crisis   Critical care was time spent personally by me on the following activities:  Discussions with consultants, examination of patient, obtaining history from patient or surrogate, ordering and review of laboratory studies, pulse oximetry and re-evaluation of patient's condition   (including critical care time)  Medications Ordered in ED Medications  sodium chloride 0.9 % bolus 500 mL (has no administration in time range)  insulin regular, human (MYXREDLIN) 100 units/ 100 mL infusion (has no administration in time range)  dextrose 5 %-0.45 % sodium chloride infusion (has no administration in time range)  0.9 %  sodium chloride infusion (has no administration in time range)  potassium chloride 10 mEq in 100 mL IVPB (has no administration in time range)  sodium chloride 0.9 % bolus 1,000 mL (0 mLs Intravenous Stopped 12/19/18 0724)  ondansetron (ZOFRAN) injection 4 mg (4 mg Intravenous Given 12/19/18 0606)  famotidine (PEPCID) IVPB 20 mg premix (0 mg Intravenous Stopped 12/19/18 0636)  metoCLOPramide (REGLAN) injection 10 mg (10 mg Intravenous Given 12/19/18 0605)  diphenhydrAMINE (BENADRYL) injection 25 mg (25 mg Intravenous Given 12/19/18 0606)     Initial Impression / Assessment and Plan / ED Course  I have reviewed the triage vital signs and the nursing notes.  Pertinent labs & imaging  results that were available during my care of the patient were reviewed by me and considered in my medical decision making (see chart for details).       Patient was given IV fluids, IV nausea medication.  When I review his most recent radiology studies he has already had evaluation of his current complaints which he also had his prior ED visit.  7:15 AM patient's lab work is consistent with DKA, however his blood sugar is not that elevated.  Patient states he has been using a hand sanitizer, he does not know if it was made in [place], there has been some contamination with methanol.  He denies drinking any alcohol at all.  Osmolality was added to his lab work.  His lactic acid is normal so he is not having lactic acidosis.  Patient was started on D5 half-normal saline IV fluids and insulin drip was ordered if his blood sugar goes over 250.  07:26 Dr Manuella Ghazi, hospitalist, will admit.  Final Clinical Impressions(s) / ED Diagnoses   Final diagnoses:  Metabolic acidosis  Intractable vomiting with nausea, unspecified vomiting type  Midline low back pain without sciatica, unspecified chronicity    Plan admission  Rolland Porter, MD, Barbette Or, MD 12/19/18 [phone]

## 2018-12-19 NOTE — Progress Notes (Signed)
CSW received consult for medication assistance. Please consult RN case management for medication assistance. CSW signing off at this time. Please re-consult for future social work needs.  Lamonte Richer, LCSW, [place] Worker II [phone]

## 2018-12-19 NOTE — H&P (Addendum)
History and Physical    [name] PettyDanny L Kersh ZOX:[id number] DOB: Jun 24, 1970 DOA: 12/19/2018  PCP: Jacquelin HawkingMcElroy, Shannon, PA-C   Patient coming from: Home  Chief Complaint: Abd pain/N/V  HPI: [name] [name] is a 48 y.o. male with medical history significant for type 2 diabetes, diabetic gastroparesis, depression, sleep apnea without CPAP, dyslipidemia, tobacco abuse, CAD, COPD, hypertension, Barrett's esophagus, and questionable medication noncompliance, who presented to the ED with complaints of general unwellness as well as some abdominal pain, nausea and vomiting that has been persisting over the last several days.  He has had poor oral intake as a result.  He was actually seen in the ED just a couple days prior on 7/19 and was evaluated by behavioral health for some depression and feelings of helplessness.  He was discharged to home without any significant suicidal ideation noted.  He presented back early this morning with weakness as well as chronic pain issues along with nausea and vomiting and inability to sleep in the last several days.  He states that he is try to be compliant with his home medications and does have gastroparesis which can become exacerbated and caused him to feel this way.  He states that he used to see multiple specialists for diabetes and his GI issues, but does not any longer because he recently lost his job.  He denies any significant fevers, chills, chest pain, or shortness of breath.   ED Course: Vital signs stable with some mild tachycardia.  BUN 22 and creatinine 1.32, with prior baseline creatinine 0.9- 1.3.  CT of the head with no acute findings and lumbar films with no acute findings to his low back.  1 view chest x-ray with no acute abnormalities.  No significant leukocytosis noted.  He was noted to have an anion gap of 20 and some mild acidosis on his lab work, but no significant hyperglycemia with glucose of 182.  Urine analysis just a couple days ago with ketones present  at that time.  He was being treated as if he has had DKA and was given 2 L fluid bolus and order to start insulin drip, but this was never actually started.  He continues to have ongoing nausea and abdominal pain and does not feel like eating.  Recent hemoglobin A1c 12.8%.  pH on ABG is 7.3.  Review of Systems: All others reviewed as above and otherwise negative.  Past Medical History:  Diagnosis Date  . Anxiety   . Arthritis   . CAD (coronary artery disease)    Moderate LAD disease 2016 - Dr. Jacinto HalimGanji  . Chronic bronchitis (HCC)   . Chronic upper back pain   . COPD (chronic obstructive pulmonary disease) (HCC)   . Depression   . Diabetic peripheral neuropathy (HCC)   . GERD (gastroesophageal reflux disease)   . History of gout   . Hyperlipemia   . Hypertension   . Migraine   . Noncompliance   . 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 Sleep apnea 2016  . Type 2 diabetes mellitus (HCC)     Past Surgical History:  Procedure Laterality Date  . ANKLE SURGERY Right 1982   "had extra bones in there; took them out"  . APPENDECTOMY  1975  . CARDIAC CATHETERIZATION N/A 03/28/2015   Procedure: Left Heart Cath and Coronary Angiography;  Surgeon: Yates DecampJay Ganji, MD;  [facility];  Service: Cardiovascular;  Laterality: N/A;  . CARDIAC CATHETERIZATION N/A 03/28/2015   Procedure: Intravascular Pressure Wire/FFR  Study;  Surgeon: Yates DecampJay Ganji, MD;  [facility];  Service: Cardiovascular;  Laterality: N/A;  . CARPAL TUNNEL RELEASE Left ~ 2008  . COLONOSCOPY WITH ESOPHAGOGASTRODUODENOSCOPY (EGD)    . ELBOW FRACTURE SURGERY Left ~ 2008  . FRACTURE SURGERY    . PILONIDAL CYST EXCISION N/A 03/24/2017   Procedure: EXCISION CHRONIC  PILONIDAL ABSCESS;  Surgeon: [name] MiyamotoBlackman, Douglas, MD;  Location: [facility];  Service: General;  Laterality: N/A;  . TENDON REPAIR Left ~ 2004   "main tendon in my ankle"     reports that he has been smoking cigarettes. He has a 17.00 pack-year smoking  history. He has never used smokeless tobacco. He reports that he does not drink alcohol or use drugs.  Allergies  Allergen Reactions  . Omeprazole Magnesium Swelling    Face swells, no breathing impairment  . Esomeprazole Swelling    Face swells, no breathing impairment    Family History  Problem Relation Age of Onset  . Congestive Heart Failure Sister   . Diabetes Mother   . Hypertension Mother   . Diabetes Father   . Hypertension Father     Prior to Admission medications   Medication Sig Start Date End Date Taking? Authorizing Provider  acetaminophen (TYLENOL) 325 MG tablet Take 2 tablets (650 mg total) by mouth every 6 (six) hours as needed for moderate pain. 03/25/18   Elgergawy, Leana Roeawood S, MD  ARIPiprazole (ABILIFY) 2 MG tablet Take 2 mg by mouth daily. 11/15/18   [provider]  aspirin 81 MG tablet Take 81 mg by mouth at bedtime.     [provider]  atorvastatin (LIPITOR) 80 MG tablet Take 1 tablet (80 mg total) by mouth daily. 08/16/18   Jacquelin HawkingMcElroy, Shannon, PA-C  buPROPion (WELLBUTRIN SR) 150 MG 12 hr tablet Take 300 mg by mouth daily.    [provider]  busPIRone (BUSPAR) 5 MG tablet Take 5 mg by mouth 3 (three) times daily.    [provider]  famotidine (PEPCID) 40 MG tablet Take 1 tablet (40 mg total) by mouth 2 (two) times daily. Patient not taking: Reported on 11/13/2018 08/15/18   Len BlalockSetzer, Terri L, NP  gabapentin (NEURONTIN) 300 MG capsule Take 1 capsule (300 mg total) by mouth 2 (two) times daily. Patient not taking: Reported on 12/16/2018 08/01/18   Jacquelin HawkingMcElroy, Shannon, PA-C  insulin aspart (NOVOLOG) 100 UNIT/ML injection Before each meal 3 times a day, 140-199 - 2 units, 200-250 - 4 units, 251-299 - 6 units,  300-349 - 8 units,  350 or above 10 units. Dispense syringes and needles as needed, Ok to switch to PEN if approved. Substitute to any brand approved. DX DM2, Code E11.65 03/19/18   Leroy SeaSingh, Prashant K, MD  Insulin Glargine (LANTUS  SOLOSTAR) 100 UNIT/ML Solostar Pen Inject 40 Units into the skin at bedtime. 11/08/18   Vassie LollMadera, Carlos, MD  isosorbide mononitrate (IMDUR) 30 MG 24 hr tablet Take 1 tablet (30 mg total) by mouth daily. 06/14/18 12/16/18  Jonelle SidleMcDowell, Samuel G, MD  metFORMIN (GLUCOPHAGE) 1000 MG tablet Take 1 tablet (1,000 mg total) by mouth 2 (two) times daily with a meal. 08/16/18   Jacquelin HawkingMcElroy, Shannon, PA-C  metoprolol tartrate (LOPRESSOR) 25 MG tablet Take 25 mg by mouth 2 (two) times daily.     [provider]  nitroGLYCERIN (NITROSTAT) 0.4 MG SL tablet Place 1 tablet (0.4 mg total) under the tongue every 5 (five) minutes as needed for chest pain. 10/05/17   Jacquelin HawkingMcElroy, Shannon,  PA-C  ondansetron (ZOFRAN ODT) 4 MG disintegrating tablet Take 1 tablet (4 mg total) by mouth every 8 (eight) hours as needed for nausea. 12/17/18   Eber HongMiller, Brian, MD  promethazine (PHENERGAN) 25 MG suppository Place 1 suppository (25 mg total) rectally every 6 (six) hours as needed for nausea or vomiting. 11/09/18   Bethann BerkshireZammit, Joseph, MD  PROZAC 40 MG capsule Take 40 mg by mouth every morning. 10/13/18   [provider]    Physical Exam: Vitals:   12/19/18 0812 12/19/18 1000 12/19/18 1045 12/19/18 1159  BP: (!) 165/108 (!) 176/117 (!) 141/94 124/82  Pulse: (!) 115 (!) 117  97  Resp: 18   18  Temp: 99.4 F (37.4 C)     TempSrc: Oral     SpO2: 100% 99%    Weight:    71 kg  Height:    5\' 8"  (1.727 m)    Constitutional: NAD, calm, comfortable Vitals:   12/19/18 0812 12/19/18 1000 12/19/18 1045 12/19/18 1159  BP: (!) 165/108 (!) 176/117 (!) 141/94 124/82  Pulse: (!) 115 (!) 117  97  Resp: 18   18  Temp: 99.4 F (37.4 C)     TempSrc: Oral     SpO2: 100% 99%    Weight:    71 kg  Height:    5\' 8"  (1.727 m)   Eyes: lids and conjunctivae normal ENMT: Mucous membranes are dry. Neck: normal, supple Respiratory: clear to auscultation bilaterally. Normal respiratory effort. No accessory muscle use.  Cardiovascular: Regular rate  and rhythm, no murmurs. No extremity edema. Abdomen: no tenderness, no distention. Bowel sounds positive.  Musculoskeletal:  No joint deformity upper and lower extremities.   Skin: no rashes, lesions, ulcers.  Psychiatric: Normal judgment and insight. Alert and oriented x 3. Normal mood.   Labs on Admission: I have personally reviewed following labs and imaging studies  CBC: Recent Labs  Lab 12/16/18 1506 12/19/18 0559  WBC 16.1* 13.5*  NEUTROABS 11.6* 10.7*  HGB 16.2 16.2  HCT 49.0 46.9  MCV 93.2 90.2  PLT 289 268   Basic Metabolic Panel: Recent Labs  Lab 12/16/18 1506 12/16/18 1902 12/17/18 0446 12/19/18 0559  NA 133* 135 137 136  K 4.5 4.3 5.0 3.9  CL 93* 101 99 98  CO2 21* 20* 22 18*  GLUCOSE 97 77 153* 182*  BUN 38* 35* 28* 22*  CREATININE 2.62* 2.06* 1.59* 1.32*  CALCIUM 9.1 8.0* 8.5* 9.1  MG 1.9  --   --   --    GFR: Estimated Creatinine Clearance: 65.5 mL/min (A) (by C-G formula based on SCr of 1.32 mg/dL (H)). Liver Function Tests: Recent Labs  Lab 12/16/18 1506 12/16/18 1902 12/17/18 0446 12/19/18 0559  AST 15 15 13* 13*  ALT 13 11 10 11   ALKPHOS 82 74 72 79  BILITOT 1.8* 1.7* 2.0* 1.8*  PROT 7.2 6.6 6.6 7.2  ALBUMIN 4.1 3.7 3.6 3.9   Recent Labs  Lab 12/16/18 1506 12/19/18 0609  LIPASE 20 17   No results for input(s): AMMONIA in the last 168 hours. Coagulation Profile: No results for input(s): INR, PROTIME in the last 168 hours. Cardiac Enzymes: No results for input(s): CKTOTAL, CKMB, CKMBINDEX, TROPONINI in the last 168 hours. BNP (last 3 results) No results for input(s): PROBNP in the last 8760 hours. HbA1C: No results for input(s): HGBA1C in the last 72 hours. CBG: Recent Labs  Lab 12/17/18 0536 12/17/18 0809 12/17/18 1201 12/19/18 0532 12/19/18 [id number]  GLUCAP  170* 121* 96 184* 132*   Lipid Profile: No results for input(s): CHOL, HDL, LDLCALC, TRIG, CHOLHDL, LDLDIRECT in the last 72 hours. Thyroid Function Tests: No results  for input(s): TSH, T4TOTAL, FREET4, T3FREE, THYROIDAB in the last 72 hours. Anemia Panel: No results for input(s): VITAMINB12, FOLATE, FERRITIN, TIBC, IRON, RETICCTPCT in the last 72 hours. Urine analysis:    Component Value Date/Time   COLORURINE YELLOW 12/16/2018 1810   APPEARANCEUR HAZY (A) 12/16/2018 1810   LABSPEC 1.015 12/16/2018 1810   PHURINE 5.0 12/16/2018 1810   GLUCOSEU NEGATIVE 12/16/2018 1810   HGBUR SMALL (A) 12/16/2018 1810   BILIRUBINUR NEGATIVE 12/16/2018 1810   BILIRUBINUR negative 04/05/2018 1022   KETONESUR 20 (A) 12/16/2018 1810   PROTEINUR 30 (A) 12/16/2018 1810   UROBILINOGEN 0.2 04/05/2018 1022   NITRITE NEGATIVE 12/16/2018 1810   LEUKOCYTESUR TRACE (A) 12/16/2018 1810    Radiological Exams on Admission: No results found.   Assessment/Plan Active Problems:   Intractable nausea and vomiting    Ongoing intractable nausea and vomiting secondary to questionable DKA versus likely gastroparesis -We will order beta hydroxybutyrate -pH on ABG was 7.3 which does not reflect any significant acidosis -Maintain on IV fluid with some D5 for now, received 2 L fluid bolus in ED -SSI every 4 hours -Diabetes coordinator recommendations appreciated.  Hemoglobin A1c on 6/20 at 12.8% -Advance diet and start long-acting insulin at half dose once tolerated -Repeat labs in a.m. -Place on scheduled Reglan and consider GI evaluation as needed  Hypertension -Significant elevations noted and will maintain on hydralazine as needed -Maintain on Imdur and metoprolol and monitor  CAD -Maintain on aspirin -Maintain on statin -Maintain on Imdur and metoprolol  Dyslipidemia -Maintain on statin  COPD with ongoing tobacco abuse -No acute bronchospasms noted -Breathing treatments as needed -Nicotine patch -Counseled on cessation  Depression -Continue home medications  Falls/weakness with low back pain -Fall precautions -PT evaluation   DVT prophylaxis: Heparin  Code Status: Full Family Communication: None at bedside Disposition Plan:Evaluate for DKA/gastroparesis; IVF and insulin with diet advancement as tolerated Consults called:DM coordinator Admission status: Obs, Tele   Astra Gregg Darleen Crocker DO [facility] Pager [phone]  If 7PM-7AM, please contact night-coverage [email or web address] Password [facility]  12/19/2018, 12:16 PM

## 2018-12-20 DIAGNOSIS — E1143 Type 2 diabetes mellitus with diabetic autonomic (poly)neuropathy: Secondary | ICD-10-CM

## 2018-12-20 DIAGNOSIS — K3184 Gastroparesis: Secondary | ICD-10-CM

## 2018-12-20 DIAGNOSIS — E785 Hyperlipidemia, unspecified: Secondary | ICD-10-CM

## 2018-12-20 DIAGNOSIS — E1110 Type 2 diabetes mellitus with ketoacidosis without coma: Secondary | ICD-10-CM

## 2018-12-20 LAB — CBC
HCT: 39.3 % (ref 39.0–52.0)
Hemoglobin: 13.1 g/dL (ref 13.0–17.0)
MCH: 30.8 pg (ref 26.0–34.0)
MCHC: 33.3 g/dL (ref 30.0–36.0)
MCV: 92.3 fL (ref 80.0–100.0)
Platelets: 215 10*3/uL (ref 150–400)
RBC: 4.26 MIL/uL (ref 4.22–5.81)
RDW: 11.9 % (ref 11.5–15.5)
WBC: 7.7 10*3/uL (ref 4.0–10.5)
nRBC: 0 % (ref 0.0–0.2)

## 2018-12-20 LAB — COMPREHENSIVE METABOLIC PANEL
ALT: 9 U/L (ref 0–44)
AST: 11 U/L — ABNORMAL LOW (ref 15–41)
Albumin: 2.9 g/dL — ABNORMAL LOW (ref 3.5–5.0)
Alkaline Phosphatase: 58 U/L (ref 38–126)
Anion gap: 10 (ref 5–15)
BUN: 9 mg/dL (ref 6–20)
CO2: 24 mmol/L (ref 22–32)
Calcium: 8.3 mg/dL — ABNORMAL LOW (ref 8.9–10.3)
Chloride: 104 mmol/L (ref 98–111)
Creatinine, Ser: 0.95 mg/dL (ref 0.61–1.24)
GFR calc Af Amer: >60 mL/min (ref >60)
GFR calc non Af Amer: >60 mL/min (ref >60)
Glucose, Bld: 122 mg/dL — ABNORMAL HIGH (ref 70–99)
Potassium: 3.6 mmol/L (ref 3.5–5.1)
Sodium: 138 mmol/L (ref 135–145)
Total Bilirubin: 1.1 mg/dL (ref 0.3–1.2)
Total Protein: 5.4 g/dL — ABNORMAL LOW (ref 6.5–8.1)

## 2018-12-20 LAB — MAGNESIUM: Magnesium: 1.4 mg/dL — ABNORMAL LOW (ref 1.7–2.4)

## 2018-12-20 LAB — GLUCOSE, CAPILLARY
Glucose-Capillary: 118 mg/dL — ABNORMAL HIGH (ref 70–99)
Glucose-Capillary: 211 mg/dL — ABNORMAL HIGH (ref 70–99)
Glucose-Capillary: 223 mg/dL — ABNORMAL HIGH (ref 70–99)
Glucose-Capillary: 89 mg/dL (ref 70–99)

## 2018-12-20 MED ORDER — METOCLOPRAMIDE HCL 5 MG PO TABS: 5.0000 mg | ORAL_TABLET | Freq: Three times a day (TID) | ORAL | 1 refills | Status: DC

## 2018-12-20 MED ORDER — PROMETHAZINE HCL 25 MG RE SUPP: 25.0000 mg | Freq: Three times a day (TID) | RECTAL | 0 refills | Status: DC | PRN

## 2018-12-20 MED ORDER — NICOTINE 7 MG/24HR TD PT24: 7.0000 mg | MEDICATED_PATCH | Freq: Every day | TRANSDERMAL | 0 refills | Status: DC

## 2018-12-20 NOTE — Progress Notes (Signed)
Patient reports that he has is able to obtain his medications thorough a Med Assist program in [place] through the [employer] on [address].  He states that medications are not a barrier for him at this timed.   He lives with his parents. He is uninsured but has a PCP.  He is actively seeking disability.  Patient is not employed.   He drives and is independent in his ADLs.  No further needs identified.     Tayron Hunnell, Clydene Pugh, LCSW

## 2018-12-20 NOTE — Evaluation (Signed)
Physical Therapy Evaluation Patient Details Name: [name] [name] MRN: [id number] DOB: 1970/08/05 Today's Date: 12/20/2018   History of Present Illness  [name] [name] is a 48 y.o. male with medical history significant for type 2 diabetes, diabetic gastroparesis, depression, sleep apnea without CPAP, dyslipidemia, tobacco abuse, CAD, COPD, hypertension, Barrett's esophagus, and questionable medication noncompliance, who presented to the ED with complaints of general unwellness as well as some abdominal pain, nausea and vomiting that has been persisting over the last several days.  He has had poor oral intake as a result.  He was actually seen in the ED just a couple days prior on 7/19 and was evaluated by behavioral health for some depression and feelings of helplessness.  He was discharged to home without any significant suicidal ideation noted.  He presented back early this morning with weakness as well as chronic pain issues along with nausea and vomiting and inability to sleep in the last several days.  He states that he is try to be compliant with his home medications and does have gastroparesis which can become exacerbated and caused him to feel this way.  He states that he used to see multiple specialists for diabetes and his GI issues, but does not any longer because he recently lost his job.  He denies any significant fevers, chills, chest pain, or shortness of breath.    Clinical Impression  Patient functioning at baseline for functional mobility and gait.  Plan:  Patient discharged from physical therapy to care of nursing for ambulation daily as tolerated for length of stay.     Follow Up Recommendations No PT follow up    Equipment Recommendations  None recommended by PT    Recommendations for Other Services       Precautions / Restrictions Precautions Precautions: None Restrictions Weight Bearing Restrictions: No      Mobility  Bed Mobility Overal bed mobility:  Independent                Transfers Overall transfer level: Independent                  Ambulation/Gait Ambulation/Gait assistance: Modified independent (Device/Increase time) Gait Distance (Feet): 200 Feet Assistive device: None Gait Pattern/deviations: WFL(Within Functional Limits) Gait velocity: slightly decreased   General Gait Details: demonstrates good return for ambulation on level, inclined and declined surfaces without loss of balance  Stairs Stairs: Yes Stairs assistance: Modified independent (Device/Increase time) Stair Management: One rail Right;Step to pattern Number of Stairs: 3 General stair comments: demonstrates good return for going up/down 3 steps using 1 siderail without loss of balance  Wheelchair Mobility    Modified Rankin (Stroke Patients Only)       Balance Overall balance assessment: No apparent balance deficits (not formally assessed)                                           Pertinent Vitals/Pain Pain Assessment: 0-10 Pain Score: 5  Pain Location: mid back Pain Descriptors / Indicators: Pressure;Other (Comment)(patient states it feels like mid back is catching, knott like feeling) Pain Intervention(s): Limited activity within patient's tolerance;Monitored during session    [place] expects to be discharged to:: Private residence Living Arrangements: Parent Available Help at Discharge: Family;Available PRN/intermittently Type of Home: House Home Access: Level entry;Stairs to enter Entrance Stairs-Rails: Right Entrance Stairs-Number of Steps: 3 steps into  the front, level entry in back Home Layout: One level Home Equipment: Walker - 2 wheels;Shower seat;Bedside commode Additional Comments: equipment belongs to his parents, he uses equipment PRN    Prior Function Level of Independence: Independent with assistive device(s)         Comments: household and short distanced community  ambulator with RW PRN     Hand Dominance        Extremity/Trunk Assessment   Upper Extremity Assessment Upper Extremity Assessment: Overall WFL for tasks assessed    Lower Extremity Assessment Lower Extremity Assessment: Overall WFL for tasks assessed    Cervical / Trunk Assessment Cervical / Trunk Assessment: Normal  Communication   Communication: No difficulties  Cognition Arousal/Alertness: Awake/alert Behavior During Therapy: WFL for tasks assessed/performed Overall Cognitive Status: Within Functional Limits for tasks assessed                                        General Comments      Exercises     Assessment/Plan    PT Assessment Patent does not need any further PT services  PT Problem List         PT Treatment Interventions      PT Goals (Current goals can be found in the Care Plan section)  Acute Rehab PT Goals Patient Stated Goal: return home PT Goal Formulation: With patient Time For Goal Achievement: 12/20/18 Potential to Achieve Goals: Good    Frequency     Barriers to discharge        Co-evaluation               AM-PAC PT "6 Clicks" Mobility  Outcome Measure Help needed turning from your back to your side while in a flat bed without using bedrails?: None Help needed moving from lying on your back to sitting on the side of a flat bed without using bedrails?: None Help needed moving to and from a bed to a chair (including a wheelchair)?: None Help needed standing up from a chair using your arms (e.g., wheelchair or bedside chair)?: None Help needed to walk in hospital room?: None Help needed climbing 3-5 steps with a railing? : None 6 Click Score: 24    End of Session   Activity Tolerance: Patient tolerated treatment well Patient left: in chair;with call bell/phone within reach Nurse Communication: Mobility status PT Visit Diagnosis: Unsteadiness on feet (R26.81);Other abnormalities of gait and mobility  (R26.89);Muscle weakness (generalized) (M62.81)    Time: [id number] PT Time Calculation (min) (ACUTE ONLY): 20 min   Charges:   PT Evaluation $PT Eval Moderate Complexity: 1 Mod PT Treatments $Gait Training: 8-22 mins        11:08 AM, 12/20/18 Ocie BobJames Yanet Balliet, MPT Physical Therapist with [phone] office [phone] mobile phone

## 2018-12-20 NOTE — Discharge Summary (Signed)
Physician Discharge Summary  [name] [name] QMV:[id number] DOB: 06/16/1970 DOA: 12/19/2018  PCP: Jacquelin HawkingMcElroy, Shannon, PA-C  Admit date: 12/19/2018 Discharge date: 12/20/2018  Time spent: 35 minutes  Recommendations for Outpatient Follow-up:  1. Repeat basic metabolic panel to follow electrolytes and renal function 2. Close follow-up to patient's CBGs and A1c with further adjustment to hypoglycemic regimen as needed. 3. Continue assisting patient with smoking cessation. 4. Reassess resolution of patient's intractable nausea and vomiting; if possible discontinue the use of Reglan and monitor closely for any side effects.   Discharge Diagnoses:  Uncontrolled type 2 diabetes mellitus with nephropathy, gastroparesis, hyperglycemia and mild DKA on presentation. Gastroparesis flare Intractable nausea and vomiting Hypertension Coronary artery disease Hyperlipidemia COPD Tobacco abuse Depression/anxiety Acute kidney injury on chronic renal failure stage III   Discharge Condition: Stable and improved.  Patient discharged home with instructions to follow-up with PCP in 10 days.  Diet recommendation: Heart healthy diet and modify carbohydrates.  Filed Weights   12/19/18 1159  Weight: 71 kg    History of present illness:  As per H&P written by Dr. Sherryll BurgerShah on 12/19/2018 48 y.o. male with medical history significant for type 2 diabetes, diabetic gastroparesis, depression, sleep apnea without CPAP, dyslipidemia, tobacco abuse, CAD, COPD, hypertension, Barrett's esophagus, and questionable medication noncompliance, who presented to the ED with complaints of general unwellness as well as some abdominal pain, nausea and vomiting that has been persisting over the last several days.  He has had poor oral intake as a result.  He was actually seen in the ED just a couple days prior on 7/19 and was evaluated by behavioral health for some depression and feelings of helplessness.  He was discharged to home  without any significant suicidal ideation noted.  He presented back early this morning with weakness as well as chronic pain issues along with nausea and vomiting and inability to sleep in the last several days.  He states that he is try to be compliant with his home medications and does have gastroparesis which can become exacerbated and caused him to feel this way.  He states that he used to see multiple specialists for diabetes and his GI issues, but does not any longer because he recently lost his job.  He denies any significant fevers, chills, chest pain, or shortness of breath.   ED Course: Vital signs stable with some mild tachycardia.  BUN 22 and creatinine 1.32, with prior baseline creatinine 0.9- 1.3.  CT of the head with no acute findings and lumbar films with no acute findings to his low back.  1 view chest x-ray with no acute abnormalities.  No significant leukocytosis noted.  He was noted to have an anion gap of 20 and some mild acidosis on his lab work, but no significant hyperglycemia with glucose of 182.  Urine analysis just a couple days ago with ketones present at that time.  He was being treated as if he has had DKA and was given 2 L fluid bolus and order to start insulin drip, but this was never actually started.  He continues to have ongoing nausea and abdominal pain and does not feel like eating.  Recent hemoglobin A1c 12.8%.  pH on ABG is 7.3.  Hospital Course:  1-intractable nausea and vomiting: In the setting of gastroparesis flare -Patient received fluid resuscitation -PRN antiemetics and IV Reglan provided -Symptoms significantly improved and able to tolerate diet at discharge -Patient has been instructed to have better control of his diabetes, to  follow multiple small meals throughout the day and to use Reglan as prescribed.  2-diabetes mellitus with nephropathy, gastroparesis, hyperglycemia and mild DKA -Corrected with insulin therapy -CBGs less than 200 -Patient will be  discharge on Lantus and sliding scale insulin -Advised to follow modified carbohydrate diet and to be compliant with insulin  therapy.  3-history of coronary artery disease -Denies chest pain or shortness of breath -No acute ischemic changes appreciated on telemetry or EKG -Continue aspirin, statins, Imdur and metoprolol.  4-hyperlipidemia -Continue statins.  5-hypertension -Stable at discharge -Continue current antihypertensive medication -Patient advised to follow heart healthy diet.  6-COPD/tobacco abuse -No wheezing and good oxygen saturation on room air -Continue home bronchodilator therapy -Discharged on nicotine patch -Cessation counseling provided.  7-depression/anxiety -Continue Abilify, fluoxetine and BuSpar -No suicidal ideation or hallucinations currently.  8-acute kidney injury on chronic kidney disease stage III -In the setting of prerenal azotemia from dehydration and DKA -Renal function back to baseline after fluid resuscitation -Repeat basic metabolic panel follow-up visit to reassess renal function and stability.  Procedures:  See below for x-ray reports.  Consultations:  None  Discharge Exam: Vitals:   12/19/18 2144 12/20/18 0539  BP: (!) 135/91 (!) 134/91  Pulse: 84 69  Resp: 18 16  Temp: 98.9 F (37.2 C) 98.3 F (36.8 C)  SpO2: 99% 98%    General: Afebrile, no chest pain, no shortness of breath, no further episode of nausea vomiting.  Tolerating diet appropriately.  No dysuria. Cardiovascular: S1-S2, no rubs, no gallops, no murmurs.  No JVD on exam. Respiratory: Clear to auscultation bilaterally, normal respiratory effort. Abdomen: Soft, nontender, no distention, positive bowel sounds Extremities: No cyanosis, no clubbing.  Discharge Instructions   Discharge Instructions    Diet - low sodium heart healthy   Complete by: As directed    Diet Carb Modified   Complete by: As directed    Discharge instructions   Complete by: As  directed    Maintain adequate hydration Take medications as prescribed Arrange follow-up with PCP in 3 days follow modified carbohydrate diet; multiple small meals throughout the day to facilitate digestion and gastrointestinal movement.     Allergies as of 12/20/2018      Reactions   Omeprazole Magnesium Swelling   Face swells, no breathing impairment   Esomeprazole Swelling   Face swells, no breathing impairment      Medication List    TAKE these medications   acetaminophen 325 MG tablet Commonly known as: TYLENOL Take 2 tablets (650 mg total) by mouth every 6 (six) hours as needed for moderate pain.   ARIPiprazole 2 MG tablet Commonly known as: ABILIFY Take 2 mg by mouth daily.   aspirin 81 MG tablet Take 81 mg by mouth at bedtime.   atorvastatin 80 MG tablet Commonly known as: LIPITOR Take 1 tablet (80 mg total) by mouth daily.   buPROPion 150 MG 12 hr tablet Commonly known as: WELLBUTRIN SR Take 300 mg by mouth daily.   busPIRone 5 MG tablet Commonly known as: BUSPAR Take 5 mg by mouth 3 (three) times daily.   famotidine 40 MG tablet Commonly known as: Pepcid Take 1 tablet (40 mg total) by mouth 2 (two) times daily.   gabapentin 300 MG capsule Commonly known as: NEURONTIN Take 1 capsule (300 mg total) by mouth 2 (two) times daily.   insulin aspart 100 UNIT/ML injection Commonly known as: NovoLOG Before each meal 3 times a day, 140-199 - 2 units, 200-250 - 4  units, 251-299 - 6 units,  300-349 - 8 units,  350 or above 10 units. Dispense syringes and needles as needed, Ok to switch to PEN if approved. Substitute to any brand approved. DX DM2, Code E11.65   Insulin Glargine 100 UNIT/ML Solostar Pen Commonly known as: Lantus SoloStar Inject 40 Units into the skin at bedtime.   isosorbide mononitrate 30 MG 24 hr tablet Commonly known as: IMDUR Take 1 tablet (30 mg total) by mouth daily.   metFORMIN 1000 MG tablet Commonly known as: GLUCOPHAGE Take 1 tablet  (1,000 mg total) by mouth 2 (two) times daily with a meal.   metoCLOPramide 5 MG tablet Commonly known as: Reglan Take 1 tablet (5 mg total) by mouth 3 (three) times daily before meals.   metoprolol tartrate 25 MG tablet Commonly known as: LOPRESSOR Take 25 mg by mouth 2 (two) times daily.   nicotine 7 mg/24hr patch Commonly known as: NICODERM CQ - dosed in mg/24 hr Place 1 patch (7 mg total) onto the skin daily. Start taking on: December 21, 2018   nitroGLYCERIN 0.4 MG SL tablet Commonly known as: NITROSTAT Place 1 tablet (0.4 mg total) under the tongue every 5 (five) minutes as needed for chest pain.   ondansetron 4 MG disintegrating tablet Commonly known as: Zofran ODT Take 1 tablet (4 mg total) by mouth every 8 (eight) hours as needed for nausea.   promethazine 25 MG suppository Commonly known as: PHENERGAN Place 1 suppository (25 mg total) rectally every 8 (eight) hours as needed for refractory nausea / vomiting. What changed:   when to take this  reasons to take this   PROzac 40 MG capsule Generic drug: FLUoxetine Take 40 mg by mouth every morning.      Allergies  Allergen Reactions  . Omeprazole Magnesium Swelling    Face swells, no breathing impairment  . Esomeprazole Swelling    Face swells, no breathing impairment   Follow-up Information    Jacquelin HawkingMcElroy, Shannon, PA-C. Schedule an appointment as soon as possible for a visit in 10 day(s).   Specialty: Physician Assistant Contact information: [address] [place] [address] [phone]        Jonelle SidleMcDowell, Samuel G, MD .   Specialty: Cardiology Contact information: [address] [place] [address] [phone]           The results of significant diagnostics from this hospitalization (including imaging, microbiology, ancillary and laboratory) are listed below for reference.    Significant Diagnostic Studies: Dg Chest 1 View  Result Date: 12/16/2018 CLINICAL DATA:  Decreased appetite  with weakness since 11/27/2018. Vomiting. Back pain since fall 3 days ago. EXAM: CHEST  1 VIEW COMPARISON:  11/07/2018 FINDINGS: Lungs are adequately inflated and otherwise clear. Cardiomediastinal silhouette is normal. Minimal degenerative change of the spine. IMPRESSION: No acute findings. Electronically Signed   By: Elberta Fortisaniel  Boyle M.D.   On: 12/16/2018 15:56   Dg Lumbar Spine Complete  Result Date: 12/16/2018 CLINICAL DATA:  48 year old presenting with acute low back pain related to recent falls. EXAM: LUMBAR SPINE - COMPLETE 4+ VIEW COMPARISON:  Bone window images from CT abdomen and pelvis 11/07/2018. FINDINGS: Five non-rib-bearing lumbar vertebrae with anatomic alignment. No fractures. Well-preserved disc spaces. No pars defects. No significant facet arthropathy. Degenerative disc disease and spondylosis involving visualized lower thoracic spine. Sacroiliac joints anatomically aligned without degenerative changes. Aortoiliac atherosclerosis with without evidence of aneurysm. IMPRESSION: 1. Normal lumbar spine. 2. Degenerative disc disease and spondylosis involving the visualized  lower thoracic spine. 3.  Aortic Atherosclerosis (ICD10-170.0) Electronically Signed   By: Hulan Saashomas  Lawrence M.D.   On: 12/16/2018 16:06   Ct Head Wo Contrast  Result Date: 12/16/2018 CLINICAL DATA:  Head trauma EXAM: CT HEAD WITHOUT CONTRAST TECHNIQUE: Contiguous axial images were obtained from the base of the skull through the vertex without intravenous contrast. COMPARISON:  CT maxillofacial dated November 26, 2015. FINDINGS: Brain: No evidence of acute infarction, hemorrhage, hydrocephalus, extra-axial collection or mass lesion/mass effect. There is some mild volume loss on what greater than expected for the patient's stated age. Examination is degraded by motion artifact. Vascular: No hyperdense vessel or unexpected calcification. Skull: Normal. Negative for fracture or focal lesion. Sinuses/Orbits: There is mild mucosal  thickening of the ethmoid air cells and left sphenoid sinus. Otherwise, the remaining paranasal sinuses and mastoid air cells are clear. Other: None. IMPRESSION: No acute intracranial abnormality. Electronically Signed   By: Katherine Mantlehristopher  Green M.D.   On: 12/16/2018 18:24    Microbiology: Recent Results (from the past 240 hour(s))  SARS Coronavirus 2 (CEPHEID- Performed [facility]), Hosp Order     Status: None   Collection Time: 12/16/18  2:34 PM   Specimen: Nasopharyngeal Swab  Result Value Ref Range Status   SARS Coronavirus 2 NEGATIVE NEGATIVE Final    Comment: (NOTE) If result is NEGATIVE SARS-CoV-2 target nucleic acids are NOT DETECTED. The SARS-CoV-2 RNA is generally detectable in upper and lower  respiratory specimens during the acute phase of infection. The lowest  concentration of SARS-CoV-2 viral copies this assay can detect is 250  copies / mL. A negative result does not preclude SARS-CoV-2 infection  and should not be used as the sole basis for treatment or other  patient management decisions.  A negative result may occur with  improper specimen collection / handling, submission of specimen other  than nasopharyngeal swab, presence of viral mutation(s) within the  areas targeted by this assay, and inadequate number of viral copies  (<250 copies / mL). A negative result must be combined with clinical  observations, patient history, and epidemiological information. If result is POSITIVE SARS-CoV-2 target nucleic acids are DETECTED. The SARS-CoV-2 RNA is generally detectable in upper and lower  respiratory specimens dur ing the acute phase of infection.  Positive  results are indicative of active infection with SARS-CoV-2.  Clinical  correlation with patient history and other diagnostic information is  necessary to determine patient infection status.  Positive results do  not rule out bacterial infection or co-infection with other viruses. If result is  PRESUMPTIVE POSTIVE SARS-CoV-2 nucleic acids MAY BE PRESENT.   A presumptive positive result was obtained on the submitted specimen  and confirmed on repeat testing.  While 2019 novel coronavirus  (SARS-CoV-2) nucleic acids may be present in the submitted sample  additional confirmatory testing may be necessary for epidemiological  and / or clinical management purposes  to differentiate between  SARS-CoV-2 and other Sarbecovirus currently known to infect humans.  If clinically indicated additional testing with an alternate test  methodology [phone]) is advised. The SARS-CoV-2 RNA is generally  detectable in upper and lower respiratory sp ecimens during the acute  phase of infection. The expected result is Negative. Fact Sheet for Patients:  BoilerBrush.com.cyhttps://www.fda.gov/media/136312/download Fact Sheet for Healthcare Providers: https://pope.com/https://www.fda.gov/media/136313/download This test is not yet approved or cleared by the [place] FDA and has been authorized for detection and/or diagnosis of SARS-CoV-2 by FDA under an Emergency Use Authorization (EUA).  This  EUA will remain in effect (meaning this test can be used) for the duration of the COVID-19 declaration under Section 564(b)(1) of the Act, 21 U.S.C. section 360bbb-3(b)(1), unless the authorization is terminated or revoked sooner. Performed at [facility], [address] [place], [place] [address]                                                12/18/2018 FINAL  Final  SARS Coronavirus 2 (CEPHEID - Performed in [facility] hospital lab), Hosp Order     Status: None   Collection Time: 12/19/18  8:32 AM   Specimen: Nasopharyngeal Swab  Result Value Ref Range Status   SARS Coronavirus 2 NEGATIVE NEGATIVE Final    Comment: (NOTE) If result is NEGATIVE SARS-CoV-2 target nucleic acids are NOT DETECTED. The SARS-CoV-2 RNA is generally detectable in upper and lower  respiratory specimens during the acute phase of infection. The lowest   concentration of SARS-CoV-2 viral copies this assay can detect is 250  copies / mL. A negative result does not preclude SARS-CoV-2 infection  and should not be used as the sole basis for treatment or other  patient management decisions.  A negative result may occur with  improper specimen collection / handling, submission of specimen other  than nasopharyngeal swab, presence of viral mutation(s) within the  areas targeted by this assay, and inadequate number of viral copies  (<250 copies / mL). A negative result must be combined with clinical  observations, patient history, and epidemiological information. If result is POSITIVE SARS-CoV-2 target nucleic acids are DETECTED. The SARS-CoV-2 RNA is generally detectable in upper and lower  respiratory specimens dur ing the acute phase of infection.  Positive  results are indicative of active infection with SARS-CoV-2.  Clinical  correlation with patient history and other diagnostic information is  necessary to determine patient infection status.  Positive results do  not rule out bacterial infection or co-infection with other viruses. If result is PRESUMPTIVE POSTIVE SARS-CoV-2 nucleic acids MAY BE PRESENT.   A presumptive positive result was obtained on the submitted specimen  and confirmed on repeat testing.  While 2019 novel coronavirus  (SARS-CoV-2) nucleic acids may be present in the submitted sample  additional confirmatory testing may be necessary for epidemiological  and / or clinical management purposes  to differentiate between  SARS-CoV-2 and other Sarbecovirus currently known to infect humans.  If clinically indicated additional testing with an alternate test  methodology [phone]) is advised. The SARS-CoV-2 RNA is generally  detectable in upper and lower respiratory sp ecimens during the acute  phase of infection. The expected result is Negative. Fact Sheet for Patients:  StrictlyIdeas.no Fact Sheet  for Healthcare Providers: BankingDealers.co.za This test is not yet approved or cleared by the [place] FDA and has been authorized for detection and/or diagnosis of SARS-CoV-2 by FDA under an Emergency Use Authorization (EUA).  This EUA will remain in effect (meaning this test can be used) for the duration of the COVID-19 declaration under Section 564(b)(1) of the Act, 21 U.S.C. section 360bbb-3(b)(1), unless the authorization is terminated or revoked sooner. Performed at [facility], [address], [place], [place] [address]      Labs: Basic Metabolic Panel: Recent Labs  Lab 12/16/18 1506 12/16/18 1902 12/17/18 0446 12/19/18 0559 12/20/18 0617  NA 133* 135 137 136 138  K  4.5 4.3 5.0 3.9 3.6  CL 93* 101 99 98 104  CO2 21* 20* 22 18* 24  GLUCOSE 97 77 153* 182* 122*  BUN 38* 35* 28* 22* 9  CREATININE 2.62* 2.06* 1.59* 1.32* 0.95  CALCIUM 9.1 8.0* 8.5* 9.1 8.3*  MG 1.9  --   --   --  1.4*   Liver Function Tests: Recent Labs  Lab 12/16/18 1506 12/16/18 1902 12/17/18 0446 12/19/18 0559 12/20/18 0617  AST 15 15 13* 13* 11*  ALT ALKPHOS 82 74 72 79 58  BILITOT 1.8* 1.7* 2.0* 1.8* 1.1  PROT 7.2 6.6 6.6 7.2 5.4*  ALBUMIN 4.1 3.7 3.6 3.9 2.9*   Recent Labs  Lab 12/16/18 1506 12/19/18 0609  LIPASE 20 17   CBC: Recent Labs  Lab 12/16/18 1506 12/19/18 0559 12/20/18 0617  WBC 16.1* 13.5* 7.7  NEUTROABS 11.6* 10.7*  --   HGB 16.2 16.2 13.1  HCT 49.0 46.9 39.3  MCV 93.2 90.2 92.3  PLT 289 268 215    CBG: Recent Labs  Lab 12/19/18 2031 12/20/18 0005 12/20/18 0403 12/20/18 0720 12/20/18 1130  GLUCAP 162* 223* 89 118* 211*    Signed:  Vassie Loll MD.  [facility] 12/20/2018, 12:09 PM

## 2018-12-20 NOTE — Progress Notes (Signed)
IV site x2 and telemetry removed. D/C instructions reviewed with patient, verbalized understanding. Patient ambulated to private vehicle with staff.

## 2018-12-23 ENCOUNTER — Other Ambulatory Visit: Payer: Self-pay

## 2018-12-23 ENCOUNTER — Encounter (HOSPITAL_COMMUNITY): Payer: Self-pay | Admitting: Emergency Medicine

## 2018-12-23 ENCOUNTER — Emergency Department (HOSPITAL_COMMUNITY): Payer: Self-pay

## 2018-12-23 ENCOUNTER — Inpatient Hospital Stay (HOSPITAL_COMMUNITY)
Admission: EM | Admit: 2018-12-23 | Discharge: 2018-12-26 | DRG: 392 | Disposition: A | Discharge status: Home / Self Care | Payer: Self-pay | Attending: Internal Medicine | Admitting: Internal Medicine

## 2018-12-23 DIAGNOSIS — F17210 Nicotine dependence, cigarettes, uncomplicated: Secondary | ICD-10-CM | POA: Diagnosis present

## 2018-12-23 DIAGNOSIS — I10 Essential (primary) hypertension: Secondary | ICD-10-CM | POA: Diagnosis present

## 2018-12-23 DIAGNOSIS — Z794 Long term (current) use of insulin: Secondary | ICD-10-CM

## 2018-12-23 DIAGNOSIS — Z8249 Family history of ischemic heart disease and other diseases of the circulatory system: Secondary | ICD-10-CM

## 2018-12-23 DIAGNOSIS — G4730 Sleep apnea, unspecified: Secondary | ICD-10-CM | POA: Diagnosis present

## 2018-12-23 DIAGNOSIS — F418 Other specified anxiety disorders: Secondary | ICD-10-CM | POA: Diagnosis present

## 2018-12-23 DIAGNOSIS — Z7982 Long term (current) use of aspirin: Secondary | ICD-10-CM

## 2018-12-23 DIAGNOSIS — E785 Hyperlipidemia, unspecified: Secondary | ICD-10-CM | POA: Diagnosis present

## 2018-12-23 DIAGNOSIS — R112 Nausea with vomiting, unspecified: Secondary | ICD-10-CM | POA: Diagnosis present

## 2018-12-23 DIAGNOSIS — F419 Anxiety disorder, unspecified: Secondary | ICD-10-CM | POA: Diagnosis present

## 2018-12-23 DIAGNOSIS — Z888 Allergy status to other drugs, medicaments and biological substances status: Secondary | ICD-10-CM

## 2018-12-23 DIAGNOSIS — N179 Acute kidney failure, unspecified: Secondary | ICD-10-CM | POA: Diagnosis present

## 2018-12-23 DIAGNOSIS — Z833 Family history of diabetes mellitus: Secondary | ICD-10-CM

## 2018-12-23 DIAGNOSIS — R079 Chest pain, unspecified: Secondary | ICD-10-CM

## 2018-12-23 DIAGNOSIS — K3184 Gastroparesis: Secondary | ICD-10-CM | POA: Diagnosis present

## 2018-12-23 DIAGNOSIS — K219 Gastro-esophageal reflux disease without esophagitis: Secondary | ICD-10-CM | POA: Diagnosis present

## 2018-12-23 DIAGNOSIS — Z79899 Other long term (current) drug therapy: Secondary | ICD-10-CM

## 2018-12-23 DIAGNOSIS — K210 Gastro-esophageal reflux disease with esophagitis: Principal | ICD-10-CM | POA: Diagnosis present

## 2018-12-23 DIAGNOSIS — F322 Major depressive disorder, single episode, severe without psychotic features: Secondary | ICD-10-CM | POA: Diagnosis present

## 2018-12-23 DIAGNOSIS — I2510 Atherosclerotic heart disease of native coronary artery without angina pectoris: Secondary | ICD-10-CM | POA: Diagnosis present

## 2018-12-23 DIAGNOSIS — E1143 Type 2 diabetes mellitus with diabetic autonomic (poly)neuropathy: Secondary | ICD-10-CM | POA: Diagnosis present

## 2018-12-23 DIAGNOSIS — J449 Chronic obstructive pulmonary disease, unspecified: Secondary | ICD-10-CM | POA: Diagnosis present

## 2018-12-23 DIAGNOSIS — R1115 Cyclical vomiting syndrome unrelated to migraine: Secondary | ICD-10-CM

## 2018-12-23 DIAGNOSIS — E8729 Other acidosis: Secondary | ICD-10-CM

## 2018-12-23 DIAGNOSIS — Z1159 Encounter for screening for other viral diseases: Secondary | ICD-10-CM

## 2018-12-23 DIAGNOSIS — E1165 Type 2 diabetes mellitus with hyperglycemia: Secondary | ICD-10-CM | POA: Diagnosis present

## 2018-12-23 DIAGNOSIS — E872 Acidosis: Secondary | ICD-10-CM

## 2018-12-23 LAB — BASIC METABOLIC PANEL
Anion gap: 16 — ABNORMAL HIGH (ref 5–15)
BUN: 18 mg/dL (ref 6–20)
CO2: 22 mmol/L (ref 22–32)
Calcium: 8.2 mg/dL — ABNORMAL LOW (ref 8.9–10.3)
Chloride: 100 mmol/L (ref 98–111)
Creatinine, Ser: 1.59 mg/dL — ABNORMAL HIGH (ref 0.61–1.24)
GFR calc Af Amer: 58 mL/min — ABNORMAL LOW (ref >60)
GFR calc non Af Amer: 50 mL/min — ABNORMAL LOW (ref >60)
Glucose, Bld: 133 mg/dL — ABNORMAL HIGH (ref 70–99)
Potassium: 4.4 mmol/L (ref 3.5–5.1)
Sodium: 138 mmol/L (ref 135–145)

## 2018-12-23 LAB — CBC WITH DIFFERENTIAL/PLATELET
Abs Immature Granulocytes: 0.04 10*3/uL (ref 0.00–0.07)
Basophils Absolute: 0.1 10*3/uL (ref 0.0–0.1)
Basophils Relative: 0 %
Eosinophils Absolute: 0.1 10*3/uL (ref 0.0–0.5)
Eosinophils Relative: 1 %
HCT: 44.7 % (ref 39.0–52.0)
Hemoglobin: 15.2 g/dL (ref 13.0–17.0)
Immature Granulocytes: 0 %
Lymphocytes Relative: 19 %
Lymphs Abs: 2.7 10*3/uL (ref 0.7–4.0)
MCH: 31.2 pg (ref 26.0–34.0)
MCHC: 34 g/dL (ref 30.0–36.0)
MCV: 91.8 fL (ref 80.0–100.0)
Monocytes Absolute: 0.6 10*3/uL (ref 0.1–1.0)
Monocytes Relative: 4 %
Neutro Abs: 11 10*3/uL — ABNORMAL HIGH (ref 1.7–7.7)
Neutrophils Relative %: 76 %
Platelets: 276 10*3/uL (ref 150–400)
RBC: 4.87 MIL/uL (ref 4.22–5.81)
RDW: 12.5 % (ref 11.5–15.5)
WBC: 14.5 10*3/uL — ABNORMAL HIGH (ref 4.0–10.5)
nRBC: 0 % (ref 0.0–0.2)

## 2018-12-23 LAB — COMPREHENSIVE METABOLIC PANEL
ALT: 17 U/L (ref 0–44)
AST: 19 U/L (ref 15–41)
Albumin: 3.7 g/dL (ref 3.5–5.0)
Alkaline Phosphatase: 80 U/L (ref 38–126)
Anion gap: 21 — ABNORMAL HIGH (ref 5–15)
BUN: 17 mg/dL (ref 6–20)
CO2: 19 mmol/L — ABNORMAL LOW (ref 22–32)
Calcium: 8.7 mg/dL — ABNORMAL LOW (ref 8.9–10.3)
Chloride: 99 mmol/L (ref 98–111)
Creatinine, Ser: 1.76 mg/dL — ABNORMAL HIGH (ref 0.61–1.24)
GFR calc Af Amer: 51 mL/min — ABNORMAL LOW (ref >60)
GFR calc non Af Amer: 44 mL/min — ABNORMAL LOW (ref >60)
Glucose, Bld: 120 mg/dL — ABNORMAL HIGH (ref 70–99)
Potassium: 4.1 mmol/L (ref 3.5–5.1)
Sodium: 139 mmol/L (ref 135–145)
Total Bilirubin: 1.7 mg/dL — ABNORMAL HIGH (ref 0.3–1.2)
Total Protein: 7.2 g/dL (ref 6.5–8.1)

## 2018-12-23 LAB — CBG MONITORING, ED: Glucose-Capillary: 130 mg/dL — ABNORMAL HIGH (ref 70–99)

## 2018-12-23 LAB — TROPONIN I (HIGH SENSITIVITY)
Troponin I (High Sensitivity): 9 ng/L (ref <18)
Troponin I (High Sensitivity): 10 ng/L (ref <18)

## 2018-12-23 LAB — RAPID URINE DRUG SCREEN, HOSP PERFORMED
Amphetamines: NOT DETECTED
Barbiturates: NOT DETECTED
Benzodiazepines: NOT DETECTED
Cocaine: NOT DETECTED
Opiates: NOT DETECTED
Tetrahydrocannabinol: NOT DETECTED

## 2018-12-23 LAB — LACTIC ACID, PLASMA: Lactic Acid, Venous: 1.6 mmol/L (ref 0.5–1.9)

## 2018-12-23 LAB — SALICYLATE LEVEL: Salicylate Lvl: <7 mg/dL (ref 2.8–30.0)

## 2018-12-23 LAB — LIPASE, BLOOD: Lipase: 14 U/L (ref 11–51)

## 2018-12-23 LAB — D-DIMER, QUANTITATIVE: D-Dimer, Quant: 0.39 ug/mL-FEU (ref 0.00–0.50)

## 2018-12-23 MED ORDER — SODIUM CHLORIDE 0.9 % IV SOLN
INTRAVENOUS | Status: DC
  Administered 2018-12-23: 23:00:00 via INTRAVENOUS

## 2018-12-23 MED ORDER — LACTATED RINGERS IV BOLUS
1000.0000 mL | Freq: Once | INTRAVENOUS | Status: AC
  Administered 2018-12-23: 1000 mL via INTRAVENOUS

## 2018-12-23 MED ORDER — FAMOTIDINE IN NACL 20-0.9 MG/50ML-% IV SOLN
20.0000 mg | Freq: Once | INTRAVENOUS | Status: AC
  Administered 2018-12-23: 20 mg via INTRAVENOUS
  Filled 2018-12-23: qty 50

## 2018-12-23 MED ORDER — PROMETHAZINE HCL 25 MG/ML IJ SOLN
25.0000 mg | Freq: Once | INTRAMUSCULAR | Status: AC
  Administered 2018-12-23: 25 mg via INTRAVENOUS
  Filled 2018-12-23: qty 1

## 2018-12-23 MED ORDER — LORAZEPAM 2 MG/ML IJ SOLN
1.0000 mg | Freq: Once | INTRAMUSCULAR | Status: AC
  Administered 2018-12-23: 1 mg via INTRAVENOUS
  Filled 2018-12-23: qty 1

## 2018-12-23 NOTE — ED Provider Notes (Signed)
[name]ENCY [facility] Provider Note   CSN: [id number] Arrival date & time: 12/23/18  1511     History   Chief Complaint Chief Complaint  Patient presents with  . Chest Pain  . Abdominal Pain    HPI [name] [name] is a 48 y.o. male.     HPI  Patient is a 48-year male with past medical history of type 2 diabetes mellitus, coronary artery disease, hyperlipidemia, hypertension, anxiety presenting for chest pain, nausea and vomiting.  Patient reports that her symptoms began within the last 24 hours. He reports that it is intermittent every couple hours.  He reports that it is brought on by vomiting.  He reports that this is similar presentation to when he was admitted within the last week.  He also experiences midthoracic back pain which she attributes to falling within the last week directly onto his back.  He reports some "tingling" in his distal upper and lower extremities.  Denies weakness.  Denies loss of bowel or bladder control, urinary retention with overflow incontinence or saddle anesthesia.  Denies fever or chills.  Denies history of IVDU.  Patient reports that he thinks that his vomit might be blood-tinged.  Denies bilious vomiting.  Patient denies any shortness of breath.  Patient denies any history of MI however he has had a coronary artery catheterization with "blockages".  He reports that he has some uncles who had MI in their 7140s.  Denies history of DVT/PE, recent surgery, hormone use, cancer treatment, lower extremity edema or calf tenderness or hemoptysis.  He was recently hospitalized for 24 hours within the last week.  Patient denies any history of illicit drug use or toxic alcohol ingestion.  Denies any self-harm attempts. He reports he takes metformin for his diabetes.   4:04 PM collateral information obtained from patient's mother, Clarisse Gougeancy Sizer, with the patient's permission.  According to family, patient has been "sick on his stomach" for the past 24 hours.   She reports that patient has been profusely vomiting cannot keep anything down.  She reports that he tried to eat a plum today which was unsuccessful.  She did try to take his blood pressure today and found that he had some low blood pressure readings.  First it was 91/78 followed by 106/82.  She does report that the patient seems very anxious a lot of the time due to not knowing what is wrong and having persistent illness.  She denies any concerns at the patient is thinking about harming himself.  She does report that he takes insulin and thinks he is taking it.  Past Medical History:  Diagnosis Date  . Anxiety   . Arthritis   . CAD (coronary artery disease)    Moderate LAD disease 2016 - Dr. Jacinto HalimGanji  . Chronic bronchitis (HCC)   . Chronic upper back pain   . COPD (chronic obstructive pulmonary disease) (HCC)   . Depression   . Diabetic peripheral neuropathy (HCC)   . GERD (gastroesophageal reflux disease)   . History of gout   . Hyperlipemia   . Hypertension   . Migraine   . Noncompliance   . 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 Sleep apnea 2016  . Type 2 diabetes mellitus [facility](HCC)     Patient Active Problem List   Diagnosis Date Noted  . MDD (major depressive disorder), severe (HCC) 12/17/2018  . DKA, type 2 (HCC) 11/07/2018  . High anion gap metabolic acidosis 03/22/2018  . Epigastric abdominal  pain 03/22/2018  . Nausea vomiting and diarrhea 03/22/2018  . AKI (acute kidney injury) (HCC) 03/22/2018  . Uncontrolled diabetes mellitus (HCC) 03/22/2018  . CAD (coronary artery disease) 02/15/2018  . Hyperlipidemia 02/15/2018  . Tobacco abuse 02/15/2018  . Diabetic gastroparesis (HCC) 06/02/2017  . ARF (acute renal failure) (HCC) 05/30/2017  . DKA (diabetic ketoacidoses) (HCC) 10/15/2016  . Gastroesophageal reflux disease 10/15/2016  . Anxiety with depression 10/15/2016  . HTN (hypertension) 10/15/2016  . Leukocytosis 11/25/2015  . Intractable nausea and vomiting 11/25/2015  .  Dental abscess 11/25/2015  . Nausea and vomiting 11/25/2015  . Chest pain 03/27/2015    Past Surgical History:  Procedure Laterality Date  . ANKLE SURGERY Right 1982   "had extra bones in there; took them out"  . APPENDECTOMY  1975  . CARDIAC CATHETERIZATION N/A 03/28/2015   Procedure: Left Heart Cath and Coronary Angiography;  Surgeon: Yates DecampJay Ganji, MD;  [facility];  Service: Cardiovascular;  Laterality: N/A;  . CARDIAC CATHETERIZATION N/A 03/28/2015   Procedure: Intravascular Pressure Wire/FFR Study;  Surgeon: Yates DecampJay Ganji, MD;  [facility];  Service: Cardiovascular;  Laterality: N/A;  . CARPAL TUNNEL RELEASE Left ~ 2008  . COLONOSCOPY WITH ESOPHAGOGASTRODUODENOSCOPY (EGD)    . ELBOW FRACTURE SURGERY Left ~ 2008  . FRACTURE SURGERY    . PILONIDAL CYST EXCISION N/A 03/24/2017   Procedure: EXCISION CHRONIC  PILONIDAL ABSCESS;  Surgeon: [name] MiyamotoBlackman, Douglas, MD;  Location: [facility] ORS;  Service: General;  Laterality: N/A;  . TENDON REPAIR Left ~ 2004   "main tendon in my ankle"        Home Medications    Prior to Admission medications   Medication Sig Start Date End Date Taking? Authorizing Provider  acetaminophen (TYLENOL) 325 MG tablet Take 2 tablets (650 mg total) by mouth every 6 (six) hours as needed for moderate pain. 03/25/18   Elgergawy, Leana Roeawood S, MD  ARIPiprazole (ABILIFY) 2 MG tablet Take 2 mg by mouth daily. 11/15/18   [provider]  aspirin 81 MG tablet Take 81 mg by mouth at bedtime.     [provider]  atorvastatin (LIPITOR) 80 MG tablet Take 1 tablet (80 mg total) by mouth daily. 08/16/18   Jacquelin HawkingMcElroy, Shannon, PA-C  buPROPion (WELLBUTRIN SR) 150 MG 12 hr tablet Take 300 mg by mouth daily.    [provider]  busPIRone (BUSPAR) 5 MG tablet Take 5 mg by mouth 3 (three) times daily.    [provider]  famotidine (PEPCID) 40 MG tablet Take 1 tablet (40 mg total) by mouth 2 (two) times daily. 08/15/18   Setzer,  Brand Maleserri L, NP  gabapentin (NEURONTIN) 300 MG capsule Take 1 capsule (300 mg total) by mouth 2 (two) times daily. 08/01/18   Jacquelin HawkingMcElroy, Shannon, PA-C  insulin aspart (NOVOLOG) 100 UNIT/ML injection Before each meal 3 times a day, 140-199 - 2 units, 200-250 - 4 units, 251-299 - 6 units,  300-349 - 8 units,  350 or above 10 units. Dispense syringes and needles as needed, Ok to switch to PEN if approved. Substitute to any brand approved. DX DM2, Code E11.65 03/19/18   Leroy SeaSingh, Prashant K, MD  Insulin Glargine (LANTUS SOLOSTAR) 100 UNIT/ML Solostar Pen Inject 40 Units into the skin at bedtime. 11/08/18   Vassie LollMadera, Carlos, MD  isosorbide mononitrate (IMDUR) 30 MG 24 hr tablet Take 1 tablet (30 mg total) by mouth daily. 06/14/18 12/16/18  Jonelle SidleMcDowell, Samuel G, MD  metFORMIN (GLUCOPHAGE) 1000 MG tablet  Take 1 tablet (1,000 mg total) by mouth 2 (two) times daily with a meal. 08/16/18   [name], PA-C  metoCLOPramide (REGLAN) 5 MG tablet Take 1 tablet (5 mg total) by mouth 3 (three) times daily before meals. 12/20/18 12/20/19  Vassie Loll, MD  metoprolol tartrate (LOPRESSOR) 25 MG tablet Take 25 mg by mouth 2 (two) times daily.     [provider]  nicotine (NICODERM CQ - DOSED IN MG/24 HR) 7 mg/24hr patch Place 1 patch (7 mg total) onto the skin daily. 12/21/18   Vassie Loll, MD  nitroGLYCERIN (NITROSTAT) 0.4 MG SL tablet Place 1 tablet (0.4 mg total) under the tongue every 5 (five) minutes as needed for chest pain. 10/05/17   [name], PA-C  ondansetron (ZOFRAN ODT) 4 MG disintegrating tablet Take 1 tablet (4 mg total) by mouth every 8 (eight) hours as needed for nausea. 12/17/18   Eber Hong, MD  promethazine (PHENERGAN) 25 MG suppository Place 1 suppository (25 mg total) rectally every 8 (eight) hours as needed for refractory nausea / vomiting. 12/20/18   Vassie Loll, MD  PROZAC 40 MG capsule Take 40 mg by mouth every morning. 10/13/18   [provider]    Family History Family  History  Problem Relation Age of Onset  . Congestive Heart Failure Sister   . Diabetes Mother   . Hypertension Mother   . Diabetes Father   . Hypertension Father     Social History Social History   Tobacco Use  . Smoking status: Current Every Day Smoker    Packs/day: 0.50    Years: 34.00    Pack years: 17.00    Types: Cigarettes  . Smokeless tobacco: Never Used  Substance Use Topics  . Alcohol use: Never    Alcohol/week: 0.0 standard drinks    Frequency: Never  . Drug use: Never     Allergies   Omeprazole magnesium and Esomeprazole   Review of Systems Review of Systems  Constitutional: Negative for chills and fever.  HENT: Negative for congestion and sore throat.   Eyes: Negative for visual disturbance.  Respiratory: Negative for cough, chest tightness and shortness of breath.   Cardiovascular: Positive for chest pain and palpitations. Negative for leg swelling.  Gastrointestinal: Positive for nausea and vomiting. Negative for abdominal pain, constipation and diarrhea.  Genitourinary: Negative for dysuria and flank pain.  Musculoskeletal: Positive for back pain. Negative for myalgias.  Skin: Negative for rash.  Neurological: Negative for dizziness, syncope and light-headedness.     Physical Exam Updated Vital Signs BP 91/68 (BP Location: Right Arm)   Pulse (!) 149   Temp 99.6 F (37.6 C) (Rectal)   Ht  (1.727 m)   Wt 71.7 kg   SpO2 99%   BMI 24.03 kg/m   Physical Exam Vitals signs and nursing note reviewed.  Constitutional:      General: He is not in acute distress.    Appearance: He is well-developed. He is not diaphoretic.     Comments: Appears anxious.  HENT:     Head: Normocephalic and atraumatic.  Eyes:     Conjunctiva/sclera: Conjunctivae normal.     Pupils: Pupils are equal, round, and reactive to light.  Neck:     Musculoskeletal: Normal range of motion and neck supple.  Cardiovascular:     Rate and Rhythm: Regular rhythm.  Tachycardia present.     Pulses:          Radial pulses are 2+ on the  right side and 2+ on the left side.       Dorsalis pedis pulses are 2+ on the right side and 2+ on the left side.     Heart sounds: S1 normal and S2 normal. No murmur.  Pulmonary:     Effort: Pulmonary effort is normal. Tachypnea present.     Breath sounds: Normal breath sounds. No wheezing or rales.     Comments: Speaking unlabored in full sentences.  Abdominal:     General: There is no distension.     Palpations: Abdomen is soft.     Tenderness: There is no abdominal tenderness. There is no guarding.  Musculoskeletal: Normal range of motion.        General: No deformity.     Right lower leg: He exhibits no tenderness. No edema.     Left lower leg: He exhibits no tenderness. No edema.     Comments: No midline tenderness of cervical, thoracic, or lumbar spine.  Diffuse paraspinal muscular tenderness to palpation of the thoracic region bilaterally.  Lymphadenopathy:     Cervical: No cervical adenopathy.  Skin:    General: Skin is warm and dry.     Findings: No erythema or rash.  Neurological:     Mental Status: He is alert.     Comments: Cranial nerves grossly intact. Patient moves extremities symmetrically and with good coordination.  Psychiatric:        Behavior: Behavior normal.        Thought Content: Thought content normal.        Judgment: Judgment normal.      ED Treatments / Results  Labs (all labs ordered are listed, but only abnormal results are displayed) Labs Reviewed  CBC WITH DIFFERENTIAL/PLATELET - Abnormal; Notable for the following components:      Result Value   WBC 14.5 (*)    Neutro Abs 11.0 (*)    All other components within normal limits  COMPREHENSIVE METABOLIC PANEL - Abnormal; Notable for the following components:   CO2 19 (*)    Glucose, Bld 120 (*)    Creatinine, Ser 1.76 (*)    Calcium 8.7 (*)    Total Bilirubin 1.7 (*)    GFR calc non Af Amer 44 (*)    GFR calc Af Amer  51 (*)    Anion gap 21 (*)    All other components within normal limits  CBG MONITORING, ED - Abnormal; Notable for the following components:   Glucose-Capillary 130 (*)    All other components within normal limits  LIPASE, BLOOD  D-DIMER, QUANTITATIVE (NOT AT [facility])  URINALYSIS, ROUTINE W REFLEX MICROSCOPIC  RAPID URINE DRUG SCREEN, HOSP PERFORMED  SALICYLATE LEVEL  LACTIC ACID, PLASMA  LACTIC ACID, PLASMA  TROPONIN I (HIGH SENSITIVITY)    EKG EKG Interpretation  Date/Time:  Saturday December 23 2018 15:39:19 EDT Ventricular Rate:  139 PR Interval:    QRS Duration: 120 QT Interval:  319 QTC Calculation: 486 R Axis:   -140 Text Interpretation:  Sinus tachycardia Right bundle branch block since last tracing no significant change rate slightly faster, RBBB is a chronic finding Confirmed by Noemi Chapel [phone]) on 12/23/2018 3:45:27 PM   Radiology Dg Chest Portable 1 View  Result Date: 12/23/2018 CLINICAL DATA:  Acute chest pain EXAM: PORTABLE CHEST 1 VIEW COMPARISON:  12/16/2018 and prior radiographs FINDINGS: The cardiomediastinal silhouette is unremarkable. There is no evidence of focal airspace disease, pulmonary edema, suspicious pulmonary nodule/mass, pleural effusion, or pneumothorax. No  acute bony abnormalities are identified. IMPRESSION: No active disease. Electronically Signed   By: Harmon PierJeffrey  Hu M.D.   On: 12/23/2018 16:36    Procedures Procedures (including critical care time)  Medications Ordered in ED Medications  lactated ringers bolus 1,000 mL (has no administration in time range)  LORazepam (ATIVAN) injection 1 mg (has no administration in time range)  promethazine (PHENERGAN) injection 25 mg (has no administration in time range)     Initial Impression / Assessment and Plan / ED Course  I have reviewed the triage vital signs and the nursing notes.  Pertinent labs & imaging results that were available during my care of the patient were reviewed by me and  considered in my medical decision making (see chart for details).  Clinical Course as of Dec 23 1703  Sat Dec 23, 2018  1555 Attempted to call mother x1 for collateral information. Unable to reach. Will reattempt.   [AM]  1623 WBC(!): 14.5 [AM]  1655 D-Dimer, Quant: 0.39 [AM]  1703 Anion gap(!): 21 [AM]  1703 Creatinine(!): 1.76 [AM]    Clinical Course User Index [AM] Elisha PonderMurray, Braelin Brosch B, PA-C       This is a 48-year male with a past medical history of type 2 diabetes mellitus, intractable nausea vomiting, diabetic gastroparesis, hypertension, hyperlipidemia, anxiety presenting for intractable nausea and vomiting, intermittent chest pain.  Differential diagnosis includes diabetic gastroparesis, ACS, pancreatitis, pulmonary embolism, pericarditis, gastroenteritis.  History overall atypical for ACS however patient does have history of coronary artery disease with cardiac catheterization in 2016 showing stenosis 50% of the LAD. Non-ischemic Myoview stress test in 2019.  Patient did recently leave the hospital for metabolic acidosis presumed DKA 3 days ago.  Review of record shows no prior work-up for pulmonary embolism or CTA in the chart.  Will obtain CMP, CBC, troponin, d-dimer, urinalysis, chest x-ray.  Will hydrate, give antiemetics.  Given patient's complaint of back pain status post injury, if patient's d-dimer is elevated, will add on T-spine no charge to CTA. Otherwise, will obtain plain films of thoracic spine.   Work-up demonstrating leukocytosis of 14.5.  He has had a leukocytosis with other episodes of acute vomiting syndromes.  CMP demonstrating CO2 of 19, creatinine of 1.76, up from around 1 when leaving the hospital.  See below for trend.  AG 21.  Initial troponin is negative at 9.  D-dimer not elevated at 0.39.  EKG in sinus tachycardia with right bundle branch block, otherwise nonischemic appearing. Chest x-ray, reviewed by me without cardiopulmonary disease.  Lab Results  Component  Value Date   CREATININE 1.76 (H) 12/23/2018   CREATININE 0.95 12/20/2018   CREATININE 1.32 (H) 12/19/2018   Patient to continue to receive fluids, antiemetics.  On reassessment at 5:04 PM he is without vomiting and reports improvement of his symptoms.  Care to be followed by Dr. Eber HongBrian Miller for ongoing management and repeat of labwork after hydration.  This is a shared visit with Dr. Eber HongBrian Miller. Patient was independently evaluated by this attending physician. Attending physician consulted in evaluation and management.  Final Clinical Impressions(s) / ED Diagnoses   Final diagnoses:  Non-intractable vomiting with nausea, unspecified vomiting type  Chest pain, unspecified type    ED Discharge Orders    None       Delia ChimesMurray, Kenzly Rogoff B, PA-C 12/23/18 1717    Eber HongMiller, Brian, MD 12/23/18 [phone]

## 2018-12-23 NOTE — H&P (Signed)
TRH H&P    Patient Demographics:    [name] [name], is a 48 y.o. male  MRN: [id number]  DOB - 03/23/1971  Admit Date - 12/23/2018  Referring MD/NP/PA: Eber HongBrian Miller  Outpatient Primary MD for the patient is Jacquelin HawkingMcElroy, Shannon, PA-C  Patient coming from: Home  Chief complaint-vomiting   HPI:    [name] [name]  is a 48 y.o. male, with history of type 2 diabetes mellitus, diabetic gastroparesis, depression, sleep apnea not on CPAP, dyslipidemia, CAD, COPD, hypertension, Barrett's esophagus who was just discharged from the hospital on 12/20/2018, came back to the hospital with complaints of vomiting for past 3 days.  Patient also complains of heartburn due to vomiting and abdominal pain.  Patient has been feeling dizzy when he gets up from sitting position and says he passed out on Wednesday.  He denies fever or chills.  Has been retching.  Denies coughing.  Denies being exposed to coronavirus patient In the ED patient was found to be in sinus tachycardia with acute kidney injury and metabolic acidosis.  Patient says that his blood glucose was 90 this morning so he did not take his insulin.  Patient blood glucose have been less than 150 in the hospital. Anion gap initially was 21, improved to 16 after getting IV fluids.    Review of systems:    In addition to the HPI above,    All other systems reviewed and are negative.    Past History of the following :    Past Medical History:  Diagnosis Date  . Anxiety   . Arthritis   . CAD (coronary artery disease)    Moderate LAD disease 2016 - Dr. Jacinto HalimGanji  . Chronic bronchitis (HCC)   . Chronic upper back pain   . COPD (chronic obstructive pulmonary disease) (HCC)   . Depression   . Diabetic peripheral neuropathy (HCC)   . GERD (gastroesophageal reflux disease)   . History of gout   . Hyperlipemia   . Hypertension   . Migraine   . Noncompliance   . Ringing in  the ears, bilateral   . Sleep apnea 2016  . Type 2 diabetes mellitus (HCC)       Past Surgical History:  Procedure Laterality Date  . ANKLE SURGERY Right 1982   "had extra bones in there; took them out"  . APPENDECTOMY  1975  . CARDIAC CATHETERIZATION N/A 03/28/2015   Procedure: Left Heart Cath and Coronary Angiography;  Surgeon: Yates DecampJay Ganji, MD;  [facility];  Service: Cardiovascular;  Laterality: N/A;  . CARDIAC CATHETERIZATION N/A 03/28/2015   Procedure: Intravascular Pressure Wire/FFR Study;  Surgeon: Yates DecampJay Ganji, MD;  [facility];  Service: Cardiovascular;  Laterality: N/A;  . CARPAL TUNNEL RELEASE Left ~ 2008  . COLONOSCOPY WITH ESOPHAGOGASTRODUODENOSCOPY (EGD)    . ELBOW FRACTURE SURGERY Left ~ 2008  . FRACTURE SURGERY    . PILONIDAL CYST EXCISION N/A 03/24/2017   Procedure: EXCISION CHRONIC  PILONIDAL ABSCESS;  Surgeon: [name] MiyamotoBlackman, Douglas, MD;  Location: [facility];  Service:  General;  Laterality: N/A;  . TENDON REPAIR Left ~ 2004   "main tendon in my ankle"      Social History:      Social History   Tobacco Use  . Smoking status: Current Every Day Smoker    Packs/day: 0.50    Years: 34.00    Pack years: 17.00    Types: Cigarettes  . Smokeless tobacco: Never Used  Substance Use Topics  . Alcohol use: Never    Alcohol/week: 0.0 standard drinks    Frequency: Never       Family History :     Family History  Problem Relation Age of Onset  . Congestive Heart Failure Sister   . Diabetes Mother   . Hypertension Mother   . Diabetes Father   . Hypertension Father       Home Medications:   Prior to Admission medications   Medication Sig Start Date End Date Taking? Authorizing Provider  acetaminophen (TYLENOL) 325 MG tablet Take 2 tablets (650 mg total) by mouth every 6 (six) hours as needed for moderate pain. 03/25/18  Yes Elgergawy, Silver Huguenin, MD  ARIPiprazole (ABILIFY) 2 MG tablet Take 2 mg by mouth daily. 11/15/18  Yes [provider]  aspirin 81 MG tablet Take 81 mg by mouth at bedtime.    Yes [provider]  atorvastatin (LIPITOR) 80 MG tablet Take 1 tablet (80 mg total) by mouth daily. 08/16/18  Yes Soyla Dryer, PA-C  buPROPion (WELLBUTRIN SR) 150 MG 12 hr tablet Take 300 mg by mouth daily.   Yes [provider]  busPIRone (BUSPAR) 5 MG tablet Take 5 mg by mouth 3 (three) times daily.   Yes [provider]  famotidine (PEPCID) 40 MG tablet Take 1 tablet (40 mg total) by mouth 2 (two) times daily. 08/15/18  Yes Setzer, Terri L, NP  gabapentin (NEURONTIN) 300 MG capsule Take 1 capsule (300 mg total) by mouth 2 (two) times daily. 08/01/18  Yes Soyla Dryer, PA-C  insulin aspart (NOVOLOG) 100 UNIT/ML injection Before each meal 3 times a day, 140-199 - 2 units, 200-250 - 4 units, 251-299 - 6 units,  300-349 - 8 units,  350 or above 10 units. Dispense syringes and needles as needed, Ok to switch to PEN if approved. Substitute to any brand approved. DX DM2, Code E11.65 03/19/18  Yes Thurnell Lose, MD  Insulin Glargine (LANTUS SOLOSTAR) 100 UNIT/ML Solostar Pen Inject 40 Units into the skin at bedtime. 11/08/18  Yes Barton Dubois, MD  metFORMIN (GLUCOPHAGE) 1000 MG tablet Take 1 tablet (1,000 mg total) by mouth 2 (two) times daily with a meal. 08/16/18  Yes Soyla Dryer, PA-C  metoCLOPramide (REGLAN) 5 MG tablet Take 1 tablet (5 mg total) by mouth 3 (three) times daily before meals. 12/20/18 12/20/19 Yes Barton Dubois, MD  metoprolol tartrate (LOPRESSOR) 25 MG tablet Take 25 mg by mouth 2 (two) times daily.    Yes [provider]  nicotine (NICODERM CQ - DOSED IN MG/24 HR) 7 mg/24hr patch Place 1 patch (7 mg total) onto the skin daily. 12/21/18  Yes Barton Dubois, MD  ondansetron (ZOFRAN ODT) 4 MG disintegrating tablet Take 1 tablet (4 mg total) by mouth every 8 (eight) hours as needed for nausea. 12/17/18  Yes Noemi Chapel, MD  promethazine (PHENERGAN) 25 MG suppository  Place 1 suppository (25 mg total) rectally every 8 (eight) hours as needed for refractory nausea / vomiting. 12/20/18  Yes Barton Dubois, MD  Navos  40 MG capsule Take 40 mg by mouth every morning. 10/13/18  Yes [provider]  isosorbide mononitrate (IMDUR) 30 MG 24 hr tablet Take 1 tablet (30 mg total) by mouth daily. 06/14/18 12/16/18  Jonelle Sidle, MD  nitroGLYCERIN (NITROSTAT) 0.4 MG SL tablet Place 1 tablet (0.4 mg total) under the tongue every 5 (five) minutes as needed for chest pain. 10/05/17   [name], PA-C     Allergies:     Allergies  Allergen Reactions  . Omeprazole Magnesium Swelling    Face swells, no breathing impairment  . Esomeprazole Swelling    Face swells, no breathing impairment     Physical Exam:   Vitals  Blood pressure (!) 170/104, pulse (!) 127, temperature 99.6 F (37.6 C), temperature source Rectal, resp. rate (!) 23, height  (1.727 m), weight 71.7 kg, SpO2 100 %.  1.  General: Appears in no acute distress  2. Psychiatric: Alert, oriented x3, intact insight and judgment  3. Neurologic: Cranial nerves II through grossly intact, motor strength 5/5 in all extremities  4. HEENMT:  Atraumatic normocephalic, extraocular muscles are intact  5. Respiratory : Clear to auscultation bilaterally, no wheezing or crackles  6. Cardiovascular : S1-S2, regular, no murmur auscultated, no lower extremity edema  7. Gastrointestinal:  Abdomen is soft, mild generalized tenderness to palpation, no organomegaly, no rigidity or guarding     Data Review:    CBC Recent Labs  Lab 12/19/18 0559 12/20/18 0617 12/23/18 1601  WBC 13.5* 7.7 14.5*  HGB 16.2 13.1 15.2  HCT 46.9 39.3 44.7  PLT 268 215 276  MCV 90.2 92.3 91.8  MCH 31.2 30.8 31.2  MCHC 34.5 33.3 34.0  RDW 11.8 11.9 12.5  LYMPHSABS 2.2  --  2.7  MONOABS 0.5  --  0.6  EOSABS 0.1  --  0.1  BASOSABS 0.1  --  0.1    ------------------------------------------------------------------------------------------------------------------  Results for orders placed or performed during the hospital encounter of 12/23/18 (from the past 48 hour(s))  CBG monitoring, ED     Status: Abnormal   Collection Time: 12/23/18  3:37 PM  Result Value Ref Range   Glucose-Capillary 130 (H) 70 - 99 mg/dL  CBC with Differential     Status: Abnormal   Collection Time: 12/23/18  4:01 PM  Result Value Ref Range   WBC 14.5 (H) 4.0 - 10.5 K/uL   RBC 4.87 4.22 - 5.81 MIL/uL   Hemoglobin 15.2 13.0 - 17.0 g/dL   HCT 69.6 29.5 - 28.4 %   MCV 91.8 80.0 - 100.0 fL   MCH 31.2 26.0 - 34.0 pg   MCHC 34.0 30.0 - 36.0 g/dL   RDW 13.2 44.0 - 10.2 %   Platelets 276 150 - 400 K/uL   nRBC 0.0 0.0 - 0.2 %   Neutrophils Relative % 76 %   Neutro Abs 11.0 (H) 1.7 - 7.7 K/uL   Lymphocytes Relative 19 %   Lymphs Abs 2.7 0.7 - 4.0 K/uL   Monocytes Relative 4 %   Monocytes Absolute 0.6 0.1 - 1.0 K/uL   Eosinophils Relative 1 %   Eosinophils Absolute 0.1 0.0 - 0.5 K/uL   Basophils Relative 0 %   Basophils Absolute 0.1 0.0 - 0.1 K/uL   Immature Granulocytes 0 %   Abs Immature Granulocytes 0.04 0.00 - 0.07 K/uL    Comment: Performed at [facility], [address] [place], [place] [address]  Comprehensive metabolic panel     Status:  Abnormal   Collection Time: 12/23/18  4:01 PM  Result Value Ref Range   Sodium 139 135 - 145 mmol/L   Potassium 4.1 3.5 - 5.1 mmol/L   Chloride 99 98 - 111 mmol/L   CO2 19 (L) 22 - 32 mmol/L   Glucose, Bld 120 (H) 70 - 99 mg/dL   BUN 17 6 - 20 mg/dL   Creatinine, Ser 4.541.76 (H) 0.61 - 1.24 mg/dL   Calcium 8.7 (L) 8.9 - 10.3 mg/dL   Total Protein 7.2 6.5 - 8.1 g/dL   Albumin 3.7 3.5 - 5.0 g/dL   AST 19 15 - 41 U/L   ALT 17 0 - 44 U/L   Alkaline Phosphatase 80 38 - 126 U/L   Total Bilirubin 1.7 (H) 0.3 - 1.2 mg/dL   GFR calc non Af Amer 44 (L) >60 mL/min   GFR calc Af Amer 51 (L) >60 mL/min   Anion gap 21 (H)  5 - 15    Comment: Performed at [facility], [address] [place], [place] [address]  Lipase, blood     Status: None   Collection Time: 12/23/18  4:01 PM  Result Value Ref Range   Lipase 14 11 - 51 U/L    Comment: Performed at [facility], [address] [place], [place] [address]  D-dimer, quantitative (not at [facility])     Status: None   Collection Time: 12/23/18  4:01 PM  Result Value Ref Range   D-Dimer, Quant 0.39 0.00 - 0.50 ug/mL-FEU    Comment: (NOTE) At the manufacturer cut-off of 0.50 ug/mL FEU, this assay has been documented to exclude PE with a sensitivity and negative predictive value of 97 to 99%.  At this time, this assay has not been approved by the FDA to exclude DVT/VTE. Results should be correlated with clinical presentation. Performed at [facility], [address] [place], [place] [address]   Troponin I (High Sensitivity)     Status: None   Collection Time: 12/23/18  4:01 PM  Result Value Ref Range   Troponin I (High Sensitivity) 9 <18 ng/L    Comment: (NOTE) Elevated high sensitivity troponin I (hsTnI) values and significant  changes across serial measurements may suggest ACS but many other  chronic and acute conditions are known to elevate hsTnI results.  Refer to the Links section for chest pain algorithms and additional  guidance. Performed at [facility], [address] [place], [place] [address]   Salicylate level     Status: None   Collection Time: 12/23/18  4:01 PM  Result Value Ref Range   Salicylate Lvl <7.0 2.8 - 30.0 mg/dL    Comment: Performed at [facility], [address] [place], [place] [address]  Lactic acid, plasma     Status: None   Collection Time: 12/23/18  4:01 PM  Result Value Ref Range   Lactic Acid, Venous 1.6 0.5 - 1.9 mmol/L    Comment: Performed at [facility], [address] [place], [place] [address]  Troponin I (High Sensitivity)     Status: None   Collection Time: 12/23/18  5:41 PM  Result Value Ref Range   Troponin  I (High Sensitivity) 10 <18 ng/L    Comment: (NOTE) Elevated high sensitivity troponin I (hsTnI) values and significant  changes across serial measurements may suggest ACS but many other  chronic and acute conditions are known to elevate hsTnI results.  Refer to the "Links" section for chest pain algorithms and additional  guidance. Performed at [facility], [address] [place], [place] [address]   Basic metabolic panel     Status: Abnormal   Collection Time: 12/23/18 11:04 PM  Result Value Ref Range   Sodium 138 135 - 145 mmol/L   Potassium 4.4 3.5 - 5.1 mmol/L   Chloride 100 98 - 111 mmol/L   CO2 22 22 - 32 mmol/L   Glucose, Bld 133 (H) 70 - 99 mg/dL   BUN 18 6 - 20 mg/dL   Creatinine, Ser 6.211.59 (H) 0.61 - 1.24 mg/dL   Calcium 8.2 (L) 8.9 - 10.3 mg/dL   GFR calc non Af Amer 50 (L) >60 mL/min   GFR calc Af Amer 58 (L) >60 mL/min   Anion gap 16 (H) 5 - 15    Comment: Performed at [facility], [address] [place], [place] [address]    Chemistries  Recent Labs  Lab 12/17/18 [phone] [id number] 12/19/18 0559 12/20/18 0617 12/23/18 1601 12/23/18 2304  NA 137 136 138 139 138  K 5.0 3.9 3.6 4.1 4.4  CL 99 98 104 99 100  CO2 22 18* 24 19* 22  GLUCOSE 153* 182* 122* 120* 133*  BUN 28* 22* 9 17 18   CREATININE 1.59* 1.32* 0.95 1.76* 1.59*  CALCIUM 8.5* 9.1 8.3* 8.7* 8.2*  MG  --   --  1.4*  --   --   AST 13* 13* 11* 19  --   ALT 10 11 9 17   --   ALKPHOS 72 79 58 80  --   BILITOT 2.0* 1.8* 1.1 1.7*  --    ------------------------------------------------------------------------------------------------------------------  ------------------------------------------------------------------------------------------------------------------ GFR: Estimated Creatinine Clearance: 54.4 mL/min (A) (by C-G formula based on SCr of 1.59 mg/dL (H)). Liver Function Tests: Recent Labs  Lab 12/17/18 0446 12/19/18 0559 12/20/18 0617 12/23/18 1601  AST 13* 13* 11* 19  ALT 10 11 9 17   ALKPHOS 72  79 58 80  BILITOT 2.0* 1.8* 1.1 1.7*  PROT 6.6 7.2 5.4* 7.2  ALBUMIN 3.6 3.9 2.9* 3.7   Recent Labs  Lab 12/19/18 0609 12/23/18 1601  LIPASE 17 14   No results for input(s): AMMONIA in the last 168 hours. Coagulation Profile: No results for input(s): INR, PROTIME in the last 168 hours. Cardiac Enzymes: No results for input(s): CKTOTAL, CKMB, CKMBINDEX, TROPONINI in the last 168 hours. BNP (last 3 results) No results for input(s): PROBNP in the last 8760 hours. HbA1C: No results for input(s): HGBA1C in the last 72 hours. CBG: Recent Labs  Lab 12/20/18 0005 12/20/18 0403 12/20/18 0720 12/20/18 1130 12/23/18 1537  GLUCAP 223* 89 118* 211* 130*   Lipid Profile: No results for input(s): CHOL, HDL, LDLCALC, TRIG, CHOLHDL, LDLDIRECT in the last 72 hours. Thyroid Function Tests: No results for input(s): TSH, T4TOTAL, FREET4, T3FREE, THYROIDAB in the last 72 hours. Anemia Panel: No results for input(s): VITAMINB12, FOLATE, FERRITIN, TIBC, IRON, RETICCTPCT in the last 72 hours.  --------------------------------------------------------------------------------------------------------------- Urine analysis:    Component Value Date/Time   COLORURINE YELLOW 12/16/2018 1810   APPEARANCEUR HAZY (A) 12/16/2018 1810   LABSPEC 1.015 12/16/2018 1810   PHURINE 5.0 12/16/2018 1810   GLUCOSEU NEGATIVE 12/16/2018 1810   HGBUR SMALL (A) 12/16/2018 1810   BILIRUBINUR NEGATIVE 12/16/2018 1810   BILIRUBINUR negative 04/05/2018 1022   KETONESUR 20 (A) 12/16/2018 1810   PROTEINUR 30 (A) 12/16/2018 1810   UROBILINOGEN 0.2 04/05/2018 1022   NITRITE NEGATIVE 12/16/2018 1810   LEUKOCYTESUR TRACE (A) 12/16/2018 1810  Imaging Results:    Dg Thoracic Spine 2 View  Result Date: 12/23/2018 CLINICAL DATA:  Mid thoracic pain after a fall. EXAM: THORACIC SPINE 2 VIEWS COMPARISON:  Chest x-ray 12/23/2018 and 12/16/2018 FINDINGS: There is normal alignment of the thoracic spine. No evidence for  acute fracture or subluxation. Mild degenerative changes are seen in the LOWER thoracic and LOWER cervical levels. IMPRESSION: No evidence for acute abnormality. Electronically Signed   By: Norva Pavlov M.D.   On: 12/23/2018 17:36   Dg Chest Portable 1 View  Result Date: 12/23/2018 CLINICAL DATA:  Acute chest pain EXAM: PORTABLE CHEST 1 VIEW COMPARISON:  12/16/2018 and prior radiographs FINDINGS: The cardiomediastinal silhouette is unremarkable. There is no evidence of focal airspace disease, pulmonary edema, suspicious pulmonary nodule/mass, pleural effusion, or pneumothorax. No acute bony abnormalities are identified. IMPRESSION: No active disease. Electronically Signed   By: Harmon Pier M.D.   On: 12/23/2018 16:36    My personal review of EKG: Rhythm NSR, right bundle branch block   Assessment & Plan:    Active Problems:   Intractable nausea and vomiting   1. Intractable nausea vomiting-likely from underlying gastroparesis, start Reglan 5 mg IV every 6 hours, Pepcid 20 mg IV every 12 hours.  We will keep n.p.o.  Continue IV hydration with normal saline at 100 mL/h.  2. Metabolic acidosis-patient has high anion gap metabolic acidosis, blood glucose has been less than 150 in the hospital.  Patient was hypoglycemic with blood glucose of 90 this morning.  Likely patient has starvation ketoacidosis in the combination with lactic acidosis causing high anion gap metabolic acidosis.  Anion gap has come down to 16 from 21 after getting IV normal saline.  Bicarb is 22.  Continue with hydration with normal saline.  Will check BMP in a.m.  3. Acute kidney injury-patient baseline creatinine is 0.95, today presented with creatinine of 1.76, likely from dehydration due to vomiting and poor p.o. intake.  Renal function slowly improving after IV hydration with normal saline.  Repeat BMP showed creatinine of 1.59.  We will continue with normal saline, check BMP in a.m.  4. History of CAD-patient does  complain of chest pain but more of a heartburn due to repeated vomiting.  Patient's troponin x2 have been negative in the ED.  EKG is unremarkable.  Will hold aspirin due to repeated vomiting and gastric irritation, continue with statin, Imdur, metoprolol.  5. Hypertension-continue antihypertensive medications including metoprolol  6. Diabetes mellitus type 2-patient is not in DKA, blood glucose well controlled.  We will check CBG every 4 hours, sliding scale insulin with NovoLog.  Will hold Lantus at this time due to low blood glucose and poor p.o. intake.  7. Depression/anxiety-stable, continue Abilify, fluoxetine, BuSpar    DVT Prophylaxis-   Lovenox  AM Labs Ordered, also please review Full Orders  Family Communication: Admission, patients condition and plan of care including tests being ordered have been discussed with the patient  who indicate understanding and agree with the plan and Code Status.  Code Status:   Full code  Admission status: Observation: Based on patients clinical presentation and evaluation of above clinical data, I have made determination that patient will need less than 2 midnight stay in the hospital at this time.  Time spent in minutes : 60 minutes   Meredeth Ide M.D on 12/23/2018 at 11:47 PM

## 2018-12-23 NOTE — ED Triage Notes (Signed)
Patient complains of abdominal pain, nausea, vomiting and chest pain. Patient states able to hold anything down since discharge from hospital.  Patient discharged 12/20/2018 from [facility].

## 2018-12-24 LAB — CBC
HCT: 36.9 % — ABNORMAL LOW (ref 39.0–52.0)
Hemoglobin: 12.2 g/dL — ABNORMAL LOW (ref 13.0–17.0)
MCH: 30.8 pg (ref 26.0–34.0)
MCHC: 33.1 g/dL (ref 30.0–36.0)
MCV: 93.2 fL (ref 80.0–100.0)
Platelets: 234 10*3/uL (ref 150–400)
RBC: 3.96 MIL/uL — ABNORMAL LOW (ref 4.22–5.81)
RDW: 12.3 % (ref 11.5–15.5)
WBC: 10.7 10*3/uL — ABNORMAL HIGH (ref 4.0–10.5)
nRBC: 0 % (ref 0.0–0.2)

## 2018-12-24 LAB — COMPREHENSIVE METABOLIC PANEL
ALT: 12 U/L (ref 0–44)
AST: 12 U/L — ABNORMAL LOW (ref 15–41)
Albumin: 2.8 g/dL — ABNORMAL LOW (ref 3.5–5.0)
Alkaline Phosphatase: 62 U/L (ref 38–126)
Anion gap: 11 (ref 5–15)
BUN: 19 mg/dL (ref 6–20)
CO2: 25 mmol/L (ref 22–32)
Calcium: 7.9 mg/dL — ABNORMAL LOW (ref 8.9–10.3)
Chloride: 101 mmol/L (ref 98–111)
Creatinine, Ser: 1.47 mg/dL — ABNORMAL HIGH (ref 0.61–1.24)
GFR calc Af Amer: >60 mL/min (ref >60)
GFR calc non Af Amer: 55 mL/min — ABNORMAL LOW (ref >60)
Glucose, Bld: 99 mg/dL (ref 70–99)
Potassium: 4.1 mmol/L (ref 3.5–5.1)
Sodium: 137 mmol/L (ref 135–145)
Total Bilirubin: 0.9 mg/dL (ref 0.3–1.2)
Total Protein: 5.3 g/dL — ABNORMAL LOW (ref 6.5–8.1)

## 2018-12-24 LAB — GLUCOSE, CAPILLARY
Glucose-Capillary: 110 mg/dL — ABNORMAL HIGH (ref 70–99)
Glucose-Capillary: 125 mg/dL — ABNORMAL HIGH (ref 70–99)
Glucose-Capillary: 170 mg/dL — ABNORMAL HIGH (ref 70–99)
Glucose-Capillary: 85 mg/dL (ref 70–99)
Glucose-Capillary: 95 mg/dL (ref 70–99)
Glucose-Capillary: 98 mg/dL (ref 70–99)

## 2018-12-24 LAB — URINALYSIS, ROUTINE W REFLEX MICROSCOPIC
Bacteria, UA: NONE SEEN
Bilirubin Urine: NEGATIVE
Glucose, UA: NEGATIVE mg/dL
Hgb urine dipstick: NEGATIVE
Ketones, ur: 20 mg/dL — AB
Leukocytes,Ua: NEGATIVE
Nitrite: NEGATIVE
Protein, ur: 30 mg/dL — AB
Specific Gravity, Urine: 1.015 (ref 1.005–1.030)
pH: 5 (ref 5.0–8.0)

## 2018-12-24 LAB — SARS CORONAVIRUS 2 BY RT PCR (HOSPITAL ORDER, PERFORMED IN ~~LOC~~ HOSPITAL LAB): SARS Coronavirus 2: NEGATIVE

## 2018-12-24 MED ORDER — METOPROLOL TARTRATE 25 MG PO TABS
25.0000 mg | ORAL_TABLET | Freq: Two times a day (BID) | ORAL | Status: DC
  Administered 2018-12-24 – 2018-12-26 (×6): 25 mg via ORAL
  Filled 2018-12-24 (×7): qty 1

## 2018-12-24 MED ORDER — BUSPIRONE HCL 5 MG PO TABS
5.0000 mg | ORAL_TABLET | Freq: Three times a day (TID) | ORAL | Status: DC
  Administered 2018-12-24 – 2018-12-26 (×7): 5 mg via ORAL
  Filled 2018-12-24 (×8): qty 1

## 2018-12-24 MED ORDER — ONDANSETRON HCL 4 MG PO TABS: 4.0000 mg | ORAL_TABLET | Freq: Four times a day (QID) | ORAL | Status: DC | PRN

## 2018-12-24 MED ORDER — ENSURE ENLIVE PO LIQD
237.0000 mL | Freq: Two times a day (BID) | ORAL | Status: DC
  Administered 2018-12-24 – 2018-12-26 (×4): 237 mL via ORAL

## 2018-12-24 MED ORDER — ATORVASTATIN CALCIUM 40 MG PO TABS
80.0000 mg | ORAL_TABLET | Freq: Every day | ORAL | Status: DC
  Administered 2018-12-24 – 2018-12-26 (×3): 80 mg via ORAL
  Filled 2018-12-24 (×4): qty 2

## 2018-12-24 MED ORDER — CHLORPROMAZINE HCL 25 MG PO TABS
25.0000 mg | ORAL_TABLET | Freq: Once | ORAL | Status: AC
  Administered 2018-12-24: 25 mg via ORAL
  Filled 2018-12-24 (×2): qty 1

## 2018-12-24 MED ORDER — HYDRALAZINE HCL 20 MG/ML IJ SOLN: 10.0000 mg | Freq: Four times a day (QID) | INTRAMUSCULAR | Status: DC | PRN

## 2018-12-24 MED ORDER — METOCLOPRAMIDE HCL 5 MG/ML IJ SOLN
5.0000 mg | Freq: Four times a day (QID) | INTRAMUSCULAR | Status: DC
  Administered 2018-12-24 – 2018-12-26 (×10): 5 mg via INTRAVENOUS
  Filled 2018-12-24 (×10): qty 2

## 2018-12-24 MED ORDER — ENOXAPARIN SODIUM 40 MG/0.4ML ~~LOC~~ SOLN
40.0000 mg | SUBCUTANEOUS | Status: DC
  Administered 2018-12-24 – 2018-12-25 (×2): 40 mg via SUBCUTANEOUS
  Filled 2018-12-24 (×3): qty 0.4

## 2018-12-24 MED ORDER — INSULIN ASPART 100 UNIT/ML ~~LOC~~ SOLN
0.0000 [IU] | SUBCUTANEOUS | Status: DC
  Administered 2018-12-24: 1 [IU] via SUBCUTANEOUS
  Administered 2018-12-24: 2 [IU] via SUBCUTANEOUS
  Administered 2018-12-25: 3 [IU] via SUBCUTANEOUS
  Administered 2018-12-25: 1 [IU] via SUBCUTANEOUS
  Administered 2018-12-25: 3 [IU] via SUBCUTANEOUS
  Administered 2018-12-25 (×2): 2 [IU] via SUBCUTANEOUS

## 2018-12-24 MED ORDER — ISOSORBIDE MONONITRATE ER 60 MG PO TB24
30.0000 mg | ORAL_TABLET | Freq: Every day | ORAL | Status: DC
  Administered 2018-12-24 – 2018-12-26 (×3): 30 mg via ORAL
  Filled 2018-12-24 (×4): qty 1

## 2018-12-24 MED ORDER — SODIUM CHLORIDE 0.9 % IV SOLN
INTRAVENOUS | Status: DC
  Administered 2018-12-24 – 2018-12-25 (×2): via INTRAVENOUS

## 2018-12-24 MED ORDER — FAMOTIDINE IN NACL 20-0.9 MG/50ML-% IV SOLN
20.0000 mg | Freq: Two times a day (BID) | INTRAVENOUS | Status: DC
  Administered 2018-12-24 – 2018-12-25 (×4): 20 mg via INTRAVENOUS
  Filled 2018-12-24 (×5): qty 50

## 2018-12-24 MED ORDER — ONDANSETRON HCL 4 MG/2ML IJ SOLN
4.0000 mg | Freq: Four times a day (QID) | INTRAMUSCULAR | Status: DC | PRN
  Administered 2018-12-24: 4 mg via INTRAVENOUS
  Filled 2018-12-24: qty 2

## 2018-12-24 MED ORDER — FLUOXETINE HCL 20 MG PO CAPS
40.0000 mg | ORAL_CAPSULE | Freq: Every morning | ORAL | Status: DC
  Administered 2018-12-24 – 2018-12-26 (×3): 40 mg via ORAL
  Filled 2018-12-24 (×4): qty 2

## 2018-12-24 MED ORDER — ARIPIPRAZOLE 2 MG PO TABS
2.0000 mg | ORAL_TABLET | Freq: Every day | ORAL | Status: DC
  Administered 2018-12-24 – 2018-12-26 (×3): 2 mg via ORAL
  Filled 2018-12-24 (×5): qty 1

## 2018-12-24 MED ORDER — ACETAMINOPHEN 325 MG PO TABS
650.0000 mg | ORAL_TABLET | Freq: Four times a day (QID) | ORAL | Status: DC | PRN
  Administered 2018-12-24: 650 mg via ORAL
  Filled 2018-12-24: qty 2

## 2018-12-24 MED ORDER — NICOTINE 7 MG/24HR TD PT24
7.0000 mg | MEDICATED_PATCH | Freq: Every day | TRANSDERMAL | Status: DC
  Filled 2018-12-24 (×3): qty 1

## 2018-12-24 MED ORDER — BUPROPION HCL ER (XL) 300 MG PO TB24
300.0000 mg | ORAL_TABLET | Freq: Every day | ORAL | Status: DC
  Administered 2018-12-24 – 2018-12-26 (×3): 300 mg via ORAL
  Filled 2018-12-24 (×4): qty 1

## 2018-12-24 NOTE — Progress Notes (Signed)
Reports eating all of pudding for supper and keeping it down.  Consult for GI in book

## 2018-12-24 NOTE — Progress Notes (Signed)
Patient Demographics:    [name] [name], is a 48 y.o. male, DOB - [birth year], ZOX:[id number]  Admit date - 12/23/2018   Admitting Physician Meredeth IdeGagan S Lama, MD  Outpatient Primary MD for the patient is Jacquelin HawkingMcElroy, Shannon, PA-C  LOS - 0   Chief Complaint  Patient presents with  . Chest Pain  . Abdominal Pain        Subjective:    [name] today has no fevers,    No chest pain, complains of nausea and vomiting, difficulty with oral intake, epigastric discomfort, --Emesis is without bile or blood  Assessment  & Plan :    Active Problems:   Intractable nausea and vomiting  Brief Summary:- 48 y.o. male, with history of type 2 diabetes mellitus, diabetic gastroparesis, depression, sleep apnea not on CPAP, dyslipidemia, CAD, COPD, hypertension, Barrett's esophagus readmitted on 12/23/2018 with intractable emesis and anion gap metabolic acidosis  A/p 1)DM2--- uncontrolled with recent A1c of 12.8, giving unreliable oral intake at this time use sliding scale coverage, patient had episodes of hypoglycemia due to starvation/intractable emesis with poor oral intake.  No evidence of DKA at this time  2) intractable emesis--- query gastroparesis, continue Reglan, advance diet as tolerated  3)AKI----acute kidney injury due to dehydration in the setting of GI losses and poor oral intake,   creatinine on admission=1.76  ,   baseline creatinine =0.95    , creatinine is now= 1.47     , renally adjust medications, avoid nephrotoxic agents/dehydration/hypotension  4)Depression----affect is flat, continue Prozac, BuSpar, Wellbutrin and Abilify  5)HTN--continue metoprolol 5 mg daily,  may use IV Hydralazine 10 mg  Every 4 hours Prn for systolic blood pressure over 160 mmhg  6)H/o CAD--- stable, no further chest pain, continue metoprolol 25 mg daily, Lipitor 80 mg daily and isosorbide 30 mg daily.  Patient ruled out for  ACS by cardiac enzymes and EKG   Disposition/Need for in-Hospital Stay- patient unable to be discharged at this time due to persistent nausea and vomiting unable to tolerate oral intake-requiring IV fluids to avoid further diarrhea no function worsening  Code Status : Full  Family Communication:   NA (patient is alert, awake and coherent)   Disposition Plan  : Home  Consults  :  na  DVT Prophylaxis  :  Lovenox   SCDs    Lab Results  Component Value Date   PLT 234 12/24/2018    Inpatient Medications  Scheduled Meds: . ARIPiprazole  2 mg Oral Daily  . atorvastatin  80 mg Oral Daily  . buPROPion  300 mg Oral Daily  . busPIRone  5 mg Oral TID  . enoxaparin (LOVENOX) injection  40 mg Subcutaneous Q24H  . feeding supplement (ENSURE ENLIVE)  237 mL Oral BID BM  . FLUoxetine  40 mg Oral q morning - 10a  . insulin aspart  0-9 Units Subcutaneous Q4H  . isosorbide mononitrate  30 mg Oral Daily  . metoCLOPramide (REGLAN) injection  5 mg Intravenous Q6H  . metoprolol tartrate  25 mg Oral BID  . nicotine  7 mg Transdermal Daily   Continuous Infusions: . sodium chloride 100 mL/hr at 12/24/18 0103  . famotidine (PEPCID) IV 20 mg (12/24/18 0952)   PRN Meds:.acetaminophen, ondansetron **OR**  ondansetron (ZOFRAN) IV    Anti-infectives (From admission, onward)   None        Objective:   Vitals:   12/24/18 0057 12/24/18 0215 12/24/18 0530 12/24/18 1424  BP: (!) 191/121 97/62 108/67 122/77  Pulse: (!) 126 92 89 82  Resp: 20  18 18   Temp: 99.5 F (37.5 C)  99.5 F (37.5 C) 98.7 F (37.1 C)  TempSrc: Oral  Oral Oral  SpO2: 100%  97% 97%  Weight:      Height:        Wt Readings from Last 3 Encounters:  12/24/18 69.5 kg  12/19/18 71 kg  12/16/18 (!) 152 kg     Intake/Output Summary (Last 24 hours) at 12/24/2018 1455 Last data filed at 12/24/2018 1300 Gross per 24 hour  Intake 1806.84 ml  Output 1800 ml  Net 6.84 ml    Physical Exam  Gen:- Awake Alert,  In no  apparent distress   HEENT:- [place].AT, No sclera icterus Neck-Supple Neck,No JVD,.  Lungs-  CTAB , fair symmetrical air movement CV- S1, S2 normal, regular  Abd-  +ve B.Sounds, Abd Soft, epigastric discomfort without rebound or guarding    Extremity/Skin:- No  edema, pedal pulses present  Psych-affect is flat, oriented x3 Neuro-no new focal deficits, no tremors   Data Review:   Micro Results Recent Results (from the past 240 hour(s))  SARS Coronavirus 2 (CEPHEID- Performed in [facility] hospital lab), Hosp Order     Status: None   Collection Time: 12/16/18  2:34 PM   Specimen: Nasopharyngeal Swab  Result Value Ref Range Status   SARS Coronavirus 2 NEGATIVE NEGATIVE Final    Comment: (NOTE) If result is NEGATIVE SARS-CoV-2 target nucleic acids are NOT DETECTED. The SARS-CoV-2 RNA is generally detectable in upper and lower  respiratory specimens during the acute phase of infection. The lowest  concentration of SARS-CoV-2 viral copies this assay can detect is 250  copies / mL. A negative result does not preclude SARS-CoV-2 infection  and should not be used as the sole basis for treatment or other  patient management decisions.  A negative result may occur with  improper specimen collection / handling, submission of specimen other  than nasopharyngeal swab, presence of viral mutation(s) within the  areas targeted by this assay, and inadequate number of viral copies  (<250 copies / mL). A negative result must be combined with clinical  observations, patient history, and epidemiological information. If result is POSITIVE SARS-CoV-2 target nucleic acids are DETECTED. The SARS-CoV-2 RNA is generally detectable in upper and lower  respiratory specimens dur ing the acute phase of infection.  Positive  results are indicative of active infection with SARS-CoV-2.  Clinical  correlation with patient history and other diagnostic information is  necessary to determine patient infection status.   Positive results do  not rule out bacterial infection or co-infection with other viruses. If result is PRESUMPTIVE POSTIVE SARS-CoV-2 nucleic acids MAY BE PRESENT.   A presumptive positive result was obtained on the submitted specimen  and confirmed on repeat testing.  While 2019 novel coronavirus  (SARS-CoV-2) nucleic acids may be present in the submitted sample  additional confirmatory testing may be necessary for epidemiological  and / or clinical management purposes  to differentiate between  SARS-CoV-2 and other Sarbecovirus currently known to infect humans.  If clinically indicated additional testing with an alternate test  methodology [phone] is advised. The SARS-CoV-2 RNA is generally  detectable in upper and lower respiratory  sp ecimens during the acute  phase of infection. The expected result is Negative. Fact Sheet for Patients:  BoilerBrush.com.cyhttps://www.fda.gov/media/136312/download Fact Sheet for Healthcare Providers: https://pope.com/https://www.fda.gov/media/136313/download This test is not yet approved or cleared by the [place] FDA and has been authorized for detection and/or diagnosis of SARS-CoV-2 by FDA under an Emergency Use Authorization (EUA).  This EUA will remain in effect (meaning this test can be used) for the duration of the COVID-19 declaration under Section 564(b)(1) of the Act, 21 U.S.C. section 360bbb-3(b)(1), unless the authorization is terminated or revoked sooner. Performed at [facility], [address] [place], [place] [address]   Urine Culture     Status: Abnormal   Collection Time: 12/16/18  6:10 PM   Specimen: Urine, Catheterized  Result Value Ref Range Status   Specimen Description   Final    URINE, CATHETERIZED Performed at [facility], [address] [place], [place] [address]    Special Requests   Final    NONE Performed at [facility], [address] [place], [place] [address]    Culture (A)  Final    70,000 COLONIES/mL GROUP B  STREP(S.AGALACTIAE)ISOLATED TESTING AGAINST S. AGALACTIAE NOT ROUTINELY PERFORMED DUE TO PREDICTABILITY OF AMP/PEN/VAN SUSCEPTIBILITY. Performed at [facility], [address] [place], [place] [address]    Report Status 12/18/2018 FINAL  Final  SARS Coronavirus 2 (CEPHEID - Performed in [facility] hospital lab), Hosp Order     Status: None   Collection Time: 12/19/18  8:32 AM   Specimen: Nasopharyngeal Swab  Result Value Ref Range Status   SARS Coronavirus 2 NEGATIVE NEGATIVE Final    Comment: (NOTE) If result is NEGATIVE SARS-CoV-2 target nucleic acids are NOT DETECTED. The SARS-CoV-2 RNA is generally detectable in upper and lower  respiratory specimens during the acute phase of infection. The lowest  concentration of SARS-CoV-2 viral copies this assay can detect is 250  copies / mL. A negative result does not preclude SARS-CoV-2 infection  and should not be used as the sole basis for treatment or other  patient management decisions.  A negative result may occur with  improper specimen collection / handling, submission of specimen other  than nasopharyngeal swab, presence of viral mutation(s) within the  areas targeted by this assay, and inadequate number of viral copies  (<250 copies / mL). A negative result must be combined with clinical  observations, patient history, and epidemiological information. If result is POSITIVE SARS-CoV-2 target nucleic acids are DETECTED. The SARS-CoV-2 RNA is generally detectable in upper and lower  respiratory specimens dur ing the acute phase of infection.  Positive  results are indicative of active infection with SARS-CoV-2.  Clinical  correlation with patient history and other diagnostic information is  necessary to determine patient infection status.  Positive results do  not rule out bacterial infection or co-infection with other viruses. If result is PRESUMPTIVE POSTIVE SARS-CoV-2 nucleic acids MAY BE PRESENT.   A presumptive  positive result was obtained on the submitted specimen  and confirmed on repeat testing.  While 2019 novel coronavirus  (SARS-CoV-2) nucleic acids may be present in the submitted sample  additional confirmatory testing may be necessary for epidemiological  and / or clinical management purposes  to differentiate between  SARS-CoV-2 and other Sarbecovirus currently known to infect humans.  If clinically indicated additional testing with an alternate test  methodology [phone]) is advised. The SARS-CoV-2 RNA is generally  detectable in upper and lower respiratory sp ecimens during the acute  phase of infection. The expected result is Negative. Fact Sheet for Patients:  BoilerBrush.com.cy Fact Sheet for Healthcare Providers: https://pope.com/ This test is not yet approved or cleared by the [place] FDA and has been authorized for detection and/or diagnosis of SARS-CoV-2 by FDA under an Emergency Use Authorization (EUA).  This EUA will remain in effect (meaning this test can be used) for the duration of the COVID-19 declaration under Section 564(b)(1) of the Act, 21 U.S.C. section 360bbb-3(b)(1), unless the authorization is terminated or revoked sooner. Performed at [facility], [address], [place], [place] [address]   SARS Coronavirus 2 (CEPHEID - Performed in [facility] hospital lab), Hosp Order     Status: None   Collection Time: 12/23/18 11:30 PM   Specimen: Nasopharyngeal Swab  Result Value Ref Range Status   SARS Coronavirus 2 NEGATIVE NEGATIVE Final    Comment: (NOTE) If result is NEGATIVE SARS-CoV-2 target nucleic acids are NOT DETECTED. The SARS-CoV-2 RNA is generally detectable in upper and lower  respiratory specimens during the acute phase of infection. The lowest  concentration of SARS-CoV-2 viral copies this assay can detect is 250  copies / mL. A negative result does not preclude SARS-CoV-2 infection  and should  not be used as the sole basis for treatment or other  patient management decisions.  A negative result may occur with  improper specimen collection / handling, submission of specimen other  than nasopharyngeal swab, presence of viral mutation(s) within the  areas targeted by this assay, and inadequate number of viral copies  (<250 copies / mL). A negative result must be combined with clinical  observations, patient history, and epidemiological information. If result is POSITIVE SARS-CoV-2 target nucleic acids are DETECTED. The SARS-CoV-2 RNA is generally detectable in upper and lower  respiratory specimens dur ing the acute phase of infection.  Positive  results are indicative of active infection with SARS-CoV-2.  Clinical  correlation with patient history and other diagnostic information is  necessary to determine patient infection status.  Positive results do  not rule out bacterial infection or co-infection with other viruses. If result is PRESUMPTIVE POSTIVE SARS-CoV-2 nucleic acids MAY BE PRESENT.   A presumptive positive result was obtained on the submitted specimen  and confirmed on repeat testing.  While 2019 novel coronavirus  (SARS-CoV-2) nucleic acids may be present in the submitted sample  additional confirmatory testing may be necessary for epidemiological  and / or clinical management purposes  to differentiate between  SARS-CoV-2 and other Sarbecovirus currently known to infect humans.  If clinically indicated additional testing with an alternate test  methodology [phone]) is advised. The SARS-CoV-2 RNA is generally  detectable in upper and lower respiratory sp ecimens during the acute  phase of infection. The expected result is Negative. Fact Sheet for Patients:  BoilerBrush.com.cy Fact Sheet for Healthcare Providers: https://pope.com/ This test is not yet approved or cleared by the [place] FDA and has been  authorized for detection and/or diagnosis of SARS-CoV-2 by FDA under an Emergency Use Authorization (EUA).  This EUA will remain in effect (meaning this test can be used) for the duration of the COVID-19 declaration under Section 564(b)(1) of the Act, 21 U.S.C. section 360bbb-3(b)(1), unless the authorization is terminated or revoked sooner. Performed at [facility], [address], [place], [place] [address]     Radiology Reports Dg Chest 1 View  Result Date: 12/16/2018 CLINICAL DATA:  Decreased appetite with weakness since 11/27/2018. Vomiting. Back pain since fall 3 days  ago. EXAM: CHEST  1 VIEW COMPARISON:  11/07/2018 FINDINGS: Lungs are adequately inflated and otherwise clear. Cardiomediastinal silhouette is normal. Minimal degenerative change of the spine. IMPRESSION: No acute findings. Electronically Signed   By: Elberta Fortis M.D.   On: 12/16/2018 15:56   Dg Thoracic Spine 2 View  Result Date: 12/23/2018 CLINICAL DATA:  Mid thoracic pain after a fall. EXAM: THORACIC SPINE 2 VIEWS COMPARISON:  Chest x-ray 12/23/2018 and 12/16/2018 FINDINGS: There is normal alignment of the thoracic spine. No evidence for acute fracture or subluxation. Mild degenerative changes are seen in the LOWER thoracic and LOWER cervical levels. IMPRESSION: No evidence for acute abnormality. Electronically Signed   By: Norva Pavlov M.D.   On: 12/23/2018 17:36   Dg Lumbar Spine Complete  Result Date: 12/16/2018 CLINICAL DATA:  48 year old presenting with acute low back pain related to recent falls. EXAM: LUMBAR SPINE - COMPLETE 4+ VIEW COMPARISON:  Bone window images from CT abdomen and pelvis 11/07/2018. FINDINGS: Five non-rib-bearing lumbar vertebrae with anatomic alignment. No fractures. Well-preserved disc spaces. No pars defects. No significant facet arthropathy. Degenerative disc disease and spondylosis involving visualized lower thoracic spine. Sacroiliac joints anatomically aligned without degenerative  changes. Aortoiliac atherosclerosis with without evidence of aneurysm. IMPRESSION: 1. Normal lumbar spine. 2. Degenerative disc disease and spondylosis involving the visualized lower thoracic spine. 3.  Aortic Atherosclerosis (ICD10-170.0) Electronically Signed   By: Hulan Saas M.D.   On: 12/16/2018 16:06   Ct Head Wo Contrast  Result Date: 12/16/2018 CLINICAL DATA:  Head trauma EXAM: CT HEAD WITHOUT CONTRAST TECHNIQUE: Contiguous axial images were obtained from the base of the skull through the vertex without intravenous contrast. COMPARISON:  CT maxillofacial dated November 26, 2015. FINDINGS: Brain: No evidence of acute infarction, hemorrhage, hydrocephalus, extra-axial collection or mass lesion/mass effect. There is some mild volume loss on what greater than expected for the patient's stated age. Examination is degraded by motion artifact. Vascular: No hyperdense vessel or unexpected calcification. Skull: Normal. Negative for fracture or focal lesion. Sinuses/Orbits: There is mild mucosal thickening of the ethmoid air cells and left sphenoid sinus. Otherwise, the remaining paranasal sinuses and mastoid air cells are clear. Other: None. IMPRESSION: No acute intracranial abnormality. Electronically Signed   By: Katherine Mantle M.D.   On: 12/16/2018 18:24   Dg Chest Portable 1 View  Result Date: 12/23/2018 CLINICAL DATA:  Acute chest pain EXAM: PORTABLE CHEST 1 VIEW COMPARISON:  12/16/2018 and prior radiographs FINDINGS: The cardiomediastinal silhouette is unremarkable. There is no evidence of focal airspace disease, pulmonary edema, suspicious pulmonary nodule/mass, pleural effusion, or pneumothorax. No acute bony abnormalities are identified. IMPRESSION: No active disease. Electronically Signed   By: Harmon Pier M.D.   On: 12/23/2018 16:36     CBC Recent Labs  Lab 12/19/18 0559 12/20/18 0617 12/23/18 1601 12/24/18 0457  WBC 13.5* 7.7 14.5* 10.7*  HGB 16.2 13.1 15.2 12.2*  HCT 46.9 39.3  44.7 36.9*  PLT 268 215 276 234  MCV 90.2 92.3 91.8 93.2  MCH 31.2 30.8 31.2 30.8  MCHC 34.5 33.3 34.0 33.1  RDW 11.8 11.9 12.5 12.3  LYMPHSABS 2.2  --  2.7  --   MONOABS 0.5  --  0.6  --   EOSABS 0.1  --  0.1  --   BASOSABS 0.1  --  0.1  --     Chemistries  Recent Labs  Lab 12/19/18 0559 12/20/18 0617 12/23/18 1601 12/23/18 2304 12/24/18 0457  NA 136 138 139 138  137  K 3.9 3.6 4.1 4.4 4.1  CL 98 104 99 100 101  CO2 18* 24 19* 22 25  GLUCOSE 182* 122* 120* 133* 99  BUN 22* 9 17 18 19   CREATININE 1.32* 0.95 1.76* 1.59* 1.47*  CALCIUM 9.1 8.3* 8.7* 8.2* 7.9*  MG  --  1.4*  --   --   --   AST 13* 11* 19  --  12*  ALT 11 9 17   --  12  ALKPHOS 79 58 80  --  62  BILITOT 1.8* 1.1 1.7*  --  0.9   ------------------------------------------------------------------------------------------------------------------ No results for input(s): CHOL, HDL, LDLCALC, TRIG, CHOLHDL, LDLDIRECT in the last 72 hours.  Lab Results  Component Value Date   HGBA1C 12.8 (H) 11/07/2018   ------------------------------------------------------------------------------------------------------------------ No results for input(s): TSH, T4TOTAL, T3FREE, THYROIDAB in the last 72 hours.  Invalid input(s): FREET3 ------------------------------------------------------------------------------------------------------------------ No results for input(s): VITAMINB12, FOLATE, FERRITIN, TIBC, IRON, RETICCTPCT in the last 72 hours.  Coagulation profile No results for input(s): INR, PROTIME in the last 168 hours.  Recent Labs    12/23/18 1601  DDIMER 0.39    Cardiac Enzymes No results for input(s): CKMB, TROPONINI, MYOGLOBIN in the last 168 hours.  Invalid input(s): CK ------------------------------------------------------------------------------------------------------------------ No results found for: BNP   Shon Haleourage Ellee Wawrzyniak M.D on 12/24/2018 at 2:55 PM  Go to [email or web address] - for contact info  [facility] - Office  [phone]

## 2018-12-25 ENCOUNTER — Observation Stay (HOSPITAL_COMMUNITY): Payer: Self-pay

## 2018-12-25 DIAGNOSIS — R112 Nausea with vomiting, unspecified: Secondary | ICD-10-CM

## 2018-12-25 LAB — GLUCOSE, CAPILLARY
Glucose-Capillary: 135 mg/dL — ABNORMAL HIGH (ref 70–99)
Glucose-Capillary: 168 mg/dL — ABNORMAL HIGH (ref 70–99)
Glucose-Capillary: 193 mg/dL — ABNORMAL HIGH (ref 70–99)
Glucose-Capillary: 207 mg/dL — ABNORMAL HIGH (ref 70–99)
Glucose-Capillary: 208 mg/dL — ABNORMAL HIGH (ref 70–99)
Glucose-Capillary: 92 mg/dL (ref 70–99)

## 2018-12-25 LAB — BASIC METABOLIC PANEL
Anion gap: 9 (ref 5–15)
BUN: 15 mg/dL (ref 6–20)
CO2: 26 mmol/L (ref 22–32)
Calcium: 7.9 mg/dL — ABNORMAL LOW (ref 8.9–10.3)
Chloride: 103 mmol/L (ref 98–111)
Creatinine, Ser: 1.16 mg/dL (ref 0.61–1.24)
GFR calc Af Amer: >60 mL/min (ref >60)
GFR calc non Af Amer: >60 mL/min (ref >60)
Glucose, Bld: 115 mg/dL — ABNORMAL HIGH (ref 70–99)
Potassium: 3.2 mmol/L — ABNORMAL LOW (ref 3.5–5.1)
Sodium: 138 mmol/L (ref 135–145)

## 2018-12-25 MED ORDER — POTASSIUM CHLORIDE CRYS ER 20 MEQ PO TBCR
40.0000 meq | EXTENDED_RELEASE_TABLET | ORAL | Status: AC
  Administered 2018-12-25 (×2): 40 meq via ORAL
  Filled 2018-12-25 (×2): qty 2

## 2018-12-25 MED ORDER — PANTOPRAZOLE SODIUM 40 MG IV SOLR
40.0000 mg | Freq: Two times a day (BID) | INTRAVENOUS | Status: DC
  Administered 2018-12-25 (×2): 40 mg via INTRAVENOUS
  Filled 2018-12-25 (×3): qty 40

## 2018-12-25 NOTE — Consult Note (Signed)
Referring Provider: Shon Haleourage Emokpae, MD Primary Care Physician:  Jacquelin HawkingMcElroy, Shannon, PA-C Primary Gastroenterologist:  Dr. Karilyn Cotaehman  Reason for Consultation:    Nausea and vomiting.  HPI:   Patient is 48 year old Caucasian male with multiple medical problems including poorly controlled diabetes mellitus who was briefly hospitalized on 12/19/2018 with poorly controlled diabetes mellitus and gastroparesis flareup.  Patient was discharged on 12/20/2018. Patient returned on the afternoon of 12/23/2018 with complaints of abdominal pain nausea vomiting as well as chest pain/heartburn.  His blood glucose was 120.  BUN was 17 and creatinine was 1.76.  Serum lipase was normal.  Venous lactic acid level was normal.  High sensitivity troponin level was normal.  Urine drug screen was also negative. He was begun on IV fluids, IV famotidine and metoclopramide.  He is on NovoLog insulin via sliding scale. Patient says he vomited yesterday but he was able to keep her supper down.  He did not eat breakfast this morning.  He complains of daily heartburn.  He has regurgitation when he lies supine.  He feels that is what triggers his emesis.  He also complains of dysphagia.  Points to suprasternal area site of bolus obstruction.  He denies hematemesis or rectal bleeding.  He has been using Pepto-Bismol on as-needed basis.  He reports having had tarry stool 1 month ago but he was not taking Pepto-Bismol at that time.  He says his blood sugar levels fluctuate a lot.  He says he is at least 5 episodes of hypoglycemic syncope this year.  He does not have a good appetite.  He states he has lost 30 pounds this year.  He used to weigh 250 pounds.  He lost close to 70 pounds before he was diagnosed with diabetes mellitus.  He states he watches his diet and takes his medications as prescribed.  He lives with his parents.  His mother prepares his meals.  He does not take OTC NSAIDs. He has history of short segment Barrett's esophagus.   Last E[facility] with biopsy [facility] in January 2017.  Procedure was performed for chest pain suspected to be due to GERD.  EGD also revealed small sliding hiatal hernia but no evidence of reflux esophagitis.  He has taken omeprazole and esomeprazole in the past and developed facial swelling.  He said it started about 2 days later but he did not have any breathing difficulty.  He does not remember if he is tried other PPIs. Review of the systems is positive for constipation paresthesias involving both his feet.  He says he saw an eye specialist few years ago and was told he had glaucoma.  He was not able to afford surgery and has been using eyedrops.  Patient is single.  He has never married and does not have any children.  He lives with his parents.  His parents are helping him pay all his pills.  He is unemployed.  He works with [employer] for 15 years.  He states he was let go because of his diabetes.  He has applied for disability.  He states he drives very little.  He smokes about a pack a day which she has done for over 30 years.  He does not drink alcohol.  He is presently receiving care under supervision of Ms. Jacquelin HawkingShannon McElroy, PA-C [facility]. His father is diabetic and has hypertension.  He is 49 years old and doing fairly well.  Mother is [id number] and  has hypertension.  He has a brother age 88 who is diabetic.  He has another brother who was on life support for 34 days last year but does not know the details.  He lost a sister at age 13 because of CHF.  She was also diabetic.    Past Medical History:  Diagnosis Date  . Anxiety   . Arthritis   . CAD (coronary artery disease)    Moderate LAD disease 2016 - Dr. Einar Gip  . Chronic bronchitis ([place])   . Chronic upper back pain   . COPD (chronic obstructive pulmonary disease) ([place])   . Depression   . Diabetic peripheral neuropathy ([place])   . GERD (gastroesophageal reflux disease)   . History  of gout   . Hyperlipemia   . Hypertension   . Migraine   .    .    . Sleep apnea 2016  . Type 2 diabetes mellitus ([place])     Past Surgical History:  Procedure Laterality Date  . ANKLE SURGERY Right 1982   "had extra bones in there; took them out"  . APPENDECTOMY  1975  . CARDIAC CATHETERIZATION N/A 03/28/2015   Procedure: Left Heart Cath and Coronary Angiography;  Surgeon: Adrian Prows, MD;  Location: [facility];  Service: Cardiovascular;  Laterality: N/A;  . CARDIAC CATHETERIZATION N/A 03/28/2015   Procedure: Intravascular Pressure Wire/FFR Study;  Surgeon: Adrian Prows, MD;  Location: [facility];  Service: Cardiovascular;  Laterality: N/A;  . CARPAL TUNNEL RELEASE Left ~ 2008  . COLONOSCOPY WITH ESOPHAGOGASTRODUODENOSCOPY (EGD)    . ELBOW FRACTURE SURGERY Left ~ 2008  . FRACTURE SURGERY    . PILONIDAL CYST EXCISION N/A 03/24/2017   Procedure: EXCISION CHRONIC  PILONIDAL ABSCESS;  Surgeon: Coralie Keens, MD;  Location: [facility];  Service: General;  Laterality: N/A;  . TENDON REPAIR Left ~ 2004   "main tendon in my ankle"    Prior to Admission medications   Medication Sig Start Date End Date Taking? Authorizing Provider  acetaminophen (TYLENOL) 325 MG tablet Take 2 tablets (650 mg total) by mouth every 6 (six) hours as needed for moderate pain. 03/25/18  Yes Elgergawy, Silver Huguenin, MD  ARIPiprazole (ABILIFY) 2 MG tablet Take 2 mg by mouth daily. 11/15/18  Yes [provider]  aspirin 81 MG tablet Take 81 mg by mouth at bedtime.    Yes [provider]  atorvastatin (LIPITOR) 80 MG tablet Take 1 tablet (80 mg total) by mouth daily. 08/16/18  Yes Soyla Dryer, PA-C  buPROPion (WELLBUTRIN SR) 150 MG 12 hr tablet Take 300 mg by mouth daily.   Yes [provider]  busPIRone (BUSPAR) 5 MG tablet Take 5 mg by mouth 3 (three) times daily.   Yes [provider]  famotidine (PEPCID) 40 MG tablet Take 1 tablet (40 mg total) by mouth 2 (two) times  daily. 08/15/18  Yes Setzer, Terri L, NP  gabapentin (NEURONTIN) 300 MG capsule Take 1 capsule (300 mg total) by mouth 2 (two) times daily. 08/01/18  Yes Soyla Dryer, PA-C  insulin aspart (NOVOLOG) 100 UNIT/ML injection Before each meal 3 times a day, 140-199 - 2 units, 200-250 - 4 units, 251-299 - 6 units,  300-349 - 8 units,  350 or above 10 units. Dispense syringes and needles as needed, Ok to switch to PEN if approved. Substitute to any brand approved. DX DM2, Code E11.65 03/19/18  Yes Thurnell Lose, MD  Insulin Glargine (LANTUS SOLOSTAR) 100 UNIT/ML  Solostar Pen Inject 40 Units into the skin at bedtime. 11/08/18  Yes Vassie LollMadera, Carlos, MD  metFORMIN (GLUCOPHAGE) 1000 MG tablet Take 1 tablet (1,000 mg total) by mouth 2 (two) times daily with a meal. 08/16/18  Yes Jacquelin HawkingMcElroy, Shannon, PA-C  metoCLOPramide (REGLAN) 5 MG tablet Take 1 tablet (5 mg total) by mouth 3 (three) times daily before meals. 12/20/18 12/20/19 Yes Vassie LollMadera, Carlos, MD  metoprolol tartrate (LOPRESSOR) 25 MG tablet Take 25 mg by mouth 2 (two) times daily.    Yes [provider]  nicotine (NICODERM CQ - DOSED IN MG/24 HR) 7 mg/24hr patch Place 1 patch (7 mg total) onto the skin daily. 12/21/18  Yes Vassie LollMadera, Carlos, MD  ondansetron (ZOFRAN ODT) 4 MG disintegrating tablet Take 1 tablet (4 mg total) by mouth every 8 (eight) hours as needed for nausea. 12/17/18  Yes Eber HongMiller, Brian, MD  promethazine (PHENERGAN) 25 MG suppository Place 1 suppository (25 mg total) rectally every 8 (eight) hours as needed for refractory nausea / vomiting. 12/20/18  Yes Vassie LollMadera, Carlos, MD  PROZAC 40 MG capsule Take 40 mg by mouth every morning. 10/13/18  Yes [provider]  isosorbide mononitrate (IMDUR) 30 MG 24 hr tablet Take 1 tablet (30 mg total) by mouth daily. 06/14/18 12/16/18  Jonelle SidleMcDowell, Samuel G, MD  nitroGLYCERIN (NITROSTAT) 0.4 MG SL tablet Place 1 tablet (0.4 mg total) under the tongue every 5 (five) minutes as needed for chest pain. 10/05/17    Jacquelin HawkingMcElroy, Shannon, PA-C    Current Facility-Administered Medications  Medication Dose Route Frequency Provider Last Rate Last Dose  . 0.9 %  sodium chloride infusion   Intravenous Continuous Shon HaleEmokpae, Courage, MD 75 mL/hr at 12/25/18 0008    . acetaminophen (TYLENOL) tablet 650 mg  650 mg Oral Q6H PRN Meredeth IdeLama, Gagan S, MD   650 mg at 12/24/18 2030  . ARIPiprazole (ABILIFY) tablet 2 mg  2 mg Oral Daily Meredeth IdeLama, Gagan S, MD   2 mg at 12/25/18 0824  . atorvastatin (LIPITOR) tablet 80 mg  80 mg Oral Daily Meredeth IdeLama, Gagan S, MD   80 mg at 12/25/18 0824  . buPROPion (WELLBUTRIN XL) 24 hr tablet 300 mg  300 mg Oral Daily Meredeth IdeLama, Gagan S, MD   300 mg at 12/25/18 [id number]  . busPIRone (BUSPAR) tablet 5 mg  5 mg Oral TID Meredeth IdeLama, Gagan S, MD   5 mg at 12/25/18 0824  . enoxaparin (LOVENOX) injection 40 mg  40 mg Subcutaneous Q24H Sharl MaLama, Gagan S, MD   40 mg at 12/25/18 0825  . famotidine (PEPCID) IVPB 20 mg premix  20 mg Intravenous Q12H Meredeth IdeLama, Gagan S, MD 100 mL/hr at 12/24/18 2228 20 mg at 12/24/18 2228  . feeding supplement (ENSURE ENLIVE) (ENSURE ENLIVE) liquid 237 mL  237 mL Oral BID BM Meredeth IdeLama, Gagan S, MD   237 mL at 12/25/18 0832  . FLUoxetine (PROZAC) capsule 40 mg  40 mg Oral q morning - 10a Sharl MaLama, Sarina IllGagan S, MD   40 mg at 12/25/18 0824  . hydrALAZINE (APRESOLINE) injection 10 mg  10 mg Intravenous Q6H PRN Emokpae, Courage, MD      . insulin aspart (novoLOG) injection 0-9 Units  0-9 Units Subcutaneous Q4H Meredeth IdeLama, Gagan S, MD   1 Units at 12/25/18 [phone] [id number]  . isosorbide mononitrate (IMDUR) 24 hr tablet 30 mg  30 mg Oral Daily Meredeth IdeLama, Gagan S, MD   30 mg at 12/25/18 0825  . metoCLOPramide (REGLAN) injection 5 mg  5 mg Intravenous Q6H [place], Gagan  S, MD   5 mg at 12/25/18 0549  . metoprolol tartrate (LOPRESSOR) tablet 25 mg  25 mg Oral BID Meredeth Ide, MD   25 mg at 12/25/18 0824  . nicotine (NICODERM CQ - dosed in mg/24 hr) patch 7 mg  7 mg Transdermal Daily Sharl Ma, Sarina Ill, MD      . ondansetron [facility]) tablet 4 mg  4 mg Oral Q6H PRN  Meredeth Ide, MD       Or  . ondansetron [facility]) injection 4 mg  4 mg Intravenous Q6H PRN Meredeth Ide, MD   4 mg at 12/24/18 1716  . potassium chloride SA (K-DUR) CR tablet 40 mEq  40 mEq Oral Q3H Emokpae, Courage, MD   40 mEq at 12/25/18 0832    Allergies as of 12/23/2018 - Review Complete 12/23/2018  Allergen Reaction Noted  . Omeprazole magnesium Swelling 03/28/2015  . Esomeprazole Swelling 10/15/2016    Family History  Problem Relation Age of Onset  . Congestive Heart Failure Sister   . Diabetes Mother   . Hypertension Mother   . Diabetes Father   . Hypertension Father     Social History   Socioeconomic History  . Marital status: Single    Spouse name: Not on file  . Number of children: 0  . Years of education: 10th g  . Highest education level: Not on file  Occupational History  . 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  Social Needs  . Financial resource strain: Not on file  . Food insecurity    Worry: Not on file    Inability: Not on file  . Transportation needs    Medical: Not on file    Non-medical: Not on file  Tobacco Use  . Smoking status: Current Every Day Smoker    Packs/day: 0.50    Years: 34.00    Pack years: 17.00    Types: Cigarettes  . Smokeless tobacco: Never Used  Substance and Sexual Activity  . Alcohol use: Never    Alcohol/week: 0.0 standard drinks    Frequency: Never  . Drug use: Never  . Sexual activity: Not Currently  Lifestyle  . Physical activity    Days per week: Not on file    Minutes per session: Not on file  . Stress: Not on file  Relationships  . Social Musician on phone: Not on file    Gets together: Not on file    Attends religious service: Not on file    Active member of club or organization: Not on file    Attends meetings of clubs or organizations: Not on file    Relationship status: Not on file  . Intimate partner violence    Fear of current or ex partner: Not on file    Emotionally abused: Not on  file    Physically abused: Not on file    Forced sexual activity: Not on file  Other Topics Concern  . Not on file  Social History Narrative  . Not on file    Review of Systems: See HPI, otherwise normal ROS  Physical Exam: Temp:  [98 F (36.7 C)-99.3 F (37.4 C)] 98 F (36.7 C) (07/27 0512) Pulse Rate:  [74-89] 74 (07/27 0512) Resp:  [18] 18 (07/27 0512) BP: (110-122)/(75-77) 110/75 (07/27 0512) SpO2:  [96 %-99 %] 99 % (07/27 0717) Last BM Date: 12/24/18 General:   Alert,  Well-developed, well-nourished, pleasant and cooperative in NAD Head:  Normocephalic and atraumatic.  Eyes:  Sclera clear, no icterus.   Conjunctiva pink. Mouth:  Oropharyngeal mucosa is normal.  Neck:  Supple; no masses or thyromegaly. Lungs:  Clear throughout to auscultation.   No wheezes, crackles, or rhonchi. No acute distress. Heart:  Regular rate and rhythm; no murmurs, clicks, rubs,  or gallops. Abdomen:  Soft, nontender and nondistended. No masses, hepatosplenomegaly or hernias noted. Normal bowel sounds, without guarding, and without rebound.   Rectal:  Deferred until time of colonoscopy.   Extremities:  Without clubbing or edema. Neurologic:  Alert and  oriented x4;  grossly normal neurologically. Skin:  Intact without significant lesions or rashes.  Lab Results: Recent Labs    12/23/18 1601 12/24/18 0457  WBC 14.5* 10.7*  HGB 15.2 12.2*  HCT 44.7 36.9*  PLT 276 234   BMET Recent Labs    12/23/18 2304 12/24/18 0457 12/25/18 0712  NA 138 137 138  K 4.4 4.1 3.2*  CL 100 101 103  CO2 22 25 26   GLUCOSE 133* 99 115*  BUN 18 19 15   CREATININE 1.59* 1.47* 1.16  CALCIUM 8.2* 7.9* 7.9*   LFT Recent Labs    12/24/18 0457  PROT 5.3*  ALBUMIN 2.8*  AST 12*  ALT 12  ALKPHOS 62  BILITOT 0.9    Studies/Results: Chest film from 12/23/2018 revealed no abnormality. Acute abdominal series from this morning revealed no abnormality.  Assessment;  Patient is 48 year old Caucasian  male with over 14-year history of diabetes mellitus with poor control who presents with recurrent nausea and vomiting.  He has chronic GERD complicated by short segment Barrett's esophagus.  Last EGD was in January 2017.  He also suspected to have diabetic gastroparesis.  I do not see any evidence of emptying study in our system. Prior ultrasounds have been negative for cholelithiasis and he had abdominal pelvic CT on 11/07/2018 which revealed esophageal wall thickening to distal esophagus and dilated loops of proximal small bowel felt to be due to ileus. I agree that he most likely has diabetic gastroparesis and gastroesophageal reflux disease as a result.  He is presently on low-dose metoclopramide and doing somewhat better.  He is presently on IV famotidine which at current dose would not be sufficient.  Given his disease he would be much better off on proton pump inhibitor if he is able to tolerate 1.  He has never taken pantoprazole before and is willing to try it.  Other option would be to give him lansoprazole or dexlansoprazole but we do not have them available today.  Mild anemia possibly due to chronic disease.  No evidence of overt GI bleed.   Recommendations;  Discontinue famotidine. Begin pantoprazole 40 mg IV every 12 hours.  Patient is agreeable to try this medication.  He will be watched closely for any signs and symptoms of allergic reaction in which case pantoprazole will be discontinued and he will be treated with dexamethasone and diphenhydramine Diagnostic esophagogastroduodenoscopy on 12/26/2018.   LOS: 0 days      12/25/2018, 11:28 AM

## 2018-12-25 NOTE — Progress Notes (Addendum)
Patient Demographics:    [name] [name], is a 48 y.o. male, DOB - March 27, 1971, ZOX:[id number]  Admit date - 12/23/2018   Admitting Physician Meredeth Ide, MD  Outpatient Primary MD for the patient is [name], PA-C  LOS - 0   Chief Complaint  Patient presents with   Chest Pain   Abdominal Pain        Subjective:    [name] [name] today has no fevers,    No chest pain, no further emesis, epigastric discomfort less severe today, had nausea earlier in the day-- --patient and his family are insisting on GI evaluation prior to any discharge home due to persistent and recurrent GI symptoms  Assessment  & Plan :    Principal Problem:   Intractable nausea and vomiting Active Problems:   AKI (acute kidney injury) (HCC)   MDD (major depressive disorder), severe (HCC)   Gastroesophageal reflux disease   Anxiety with depression   HTN (hypertension)   Uncontrolled diabetes mellitus (HCC)  Brief Summary:- 48 y.o. male, with history of type 2 diabetes mellitus, diabetic gastroparesis, depression, sleep apnea not on CPAP, dyslipidemia, CAD, COPD, hypertension, Barrett's esophagus readmitted on 12/23/2018 with intractable emesis and anion gap metabolic acidosis --For EGD on 12/26/2018 by Dr. Karilyn Cota  A/p 1)DM2--- uncontrolled with recent A1c of 12.8, giving unreliable oral intake at this time use sliding scale coverage, patient had episodes of hypoglycemia due to starvation/intractable emesis with poor oral intake.  No evidence of DKA at this time  2) intractable emesis--- query gastroparesis, continue Reglan, advance diet as tolerated, discussed with Dr. Karilyn Cota from gastroenterology service plan is for EGD on 12/26/2018  3)AKI----acute kidney injury due to dehydration in the setting of GI losses and poor oral intake,   creatinine on admission=1.76  ,   baseline creatinine =0.95    , creatinine is now= 1.1 ,  AKI has resolved with hydration, renally adjust medications, avoid nephrotoxic agents/dehydration/hypotension  4)Depression----affect is flat, apparently patient's depressive symptoms got worse after his antidepressants were discontinued recently, continue Prozac, BuSpar, Wellbutrin and Abilify --post discharge patient should follow-up with [facility]  5)HTN--continue Metoprolol 25 mg bid,  may use IV Hydralazine 10 mg  Every 4 hours Prn for systolic blood pressure over 160 mmhg  6)H/o CAD--- stable, no further chest pain, continue metoprolol 25 mg daily, Lipitor 80 mg daily and isosorbide 30 mg daily.  Patient ruled out for ACS already by cardiac enzymes and EKG   Disposition/Need for in-Hospital Stay- patient unable to be discharged at this time due to persistent nausea and vomiting unable to tolerate oral intake-requiring IV fluids to avoid further diarrhea no function worsening -patient and his family are insisting on GI evaluation prior to any discharge home due to persistent and recurrent GI symptoms -Tentatively discharge on 12/26/2018 after EGD by Dr. Karilyn Cota  Code Status : Full  Family Communication:   (patient is alert, awake and coherent) Discussed with pt's mother by phone  Disposition Plan  : Home  Consults  :  na  DVT Prophylaxis  :  Lovenox   SCDs    Lab Results  Component Value Date   PLT 234 12/24/2018    Inpatient Medications  Scheduled Meds:  ARIPiprazole  2 mg Oral  Daily   atorvastatin  80 mg Oral Daily   buPROPion  300 mg Oral Daily   busPIRone  5 mg Oral TID   enoxaparin (LOVENOX) injection  40 mg Subcutaneous Q24H   feeding supplement (ENSURE ENLIVE)  237 mL Oral BID BM   FLUoxetine  40 mg Oral q morning - 10a   insulin aspart  0-9 Units Subcutaneous Q4H   isosorbide mononitrate  30 mg Oral Daily   metoCLOPramide (REGLAN) injection  5 mg Intravenous Q6H   metoprolol tartrate  25 mg Oral BID   nicotine  7 mg Transdermal Daily   pantoprazole  (PROTONIX) IV  40 mg Intravenous Q12H   Continuous Infusions:  sodium chloride 75 mL/hr at 12/25/18 0008   PRN Meds:.acetaminophen, hydrALAZINE, ondansetron **OR** ondansetron (ZOFRAN) IV   Anti-infectives (From admission, onward)   None        Objective:   Vitals:   12/24/18 2139 12/25/18 0512 12/25/18 0717 12/25/18 1431  BP: 122/77 110/75  117/79  Pulse: 89 74  72  Resp: 18 18  16   Temp: 99.3 F (37.4 C) 98 F (36.7 C)  98.6 F (37 C)  TempSrc: Oral Oral  Oral  SpO2: 96% 98% 99% 98%  Weight:      Height:        Wt Readings from Last 3 Encounters:  12/24/18 69.5 kg  12/19/18 71 kg  12/16/18 (!) 152 kg     Intake/Output Summary (Last 24 hours) at 12/25/2018 1733 Last data filed at 12/25/2018 1300 Gross per 24 hour  Intake 480 ml  Output 500 ml  Net -20 ml    Physical Exam  Gen:- Awake Alert,  In no apparent distress   HEENT:- [place].AT, No sclera icterus Neck-Supple Neck,No JVD,.  Lungs-  CTAB , fair symmetrical air movement CV- S1, S2 normal, regular  Abd-  +ve B.Sounds, Abd Soft, epigastric discomfort without rebound or guarding    Extremity/Skin:- No  edema, pedal pulses present  Psych-affect is flat, oriented x3 Neuro-no new focal deficits, no tremors   Data Review:   Micro Results Recent Results (from the past 240 hour(s))  SARS Coronavirus 2 (CEPHEID- Performed in [place] hospital lab), Hosp Order     Status: None   Collection Time: 12/16/18  2:34 PM   Specimen: Nasopharyngeal Swab  Result Value Ref Range Status   SARS Coronavirus 2 NEGATIVE NEGATIVE Final    Comment: (NOTE) If result is NEGATIVE SARS-CoV-2 target nucleic acids are NOT DETECTED. The SARS-CoV-2 RNA is generally detectable in upper and lower  respiratory specimens during the acute phase of infection. The lowest  concentration of SARS-CoV-2 viral copies this assay can detect is 250  copies / mL. A negative result does not preclude SARS-CoV-2 infection  and should not be used  as the sole basis for treatment or other  patient management decisions.  A negative result may occur with  improper specimen collection / handling, submission of specimen other  than nasopharyngeal swab, presence of viral mutation(s) within the  areas targeted by this assay, and inadequate number of viral copies  (<250 copies / mL). A negative result must be combined with clinical  observations, patient history, and epidemiological information. If result is POSITIVE SARS-CoV-2 target nucleic acids are DETECTED. The SARS-CoV-2 RNA is generally detectable in upper and lower  respiratory specimens dur ing the acute phase of infection.  Positive  results are indicative of active infection with SARS-CoV-2.  Clinical  correlation with patient history  and other diagnostic information is  necessary to determine patient infection status.  Positive results do  not rule out bacterial infection or co-infection with other viruses. If result is PRESUMPTIVE POSTIVE SARS-CoV-2 nucleic acids MAY BE PRESENT.   A presumptive positive result was obtained on the submitted specimen  and confirmed on repeat testing.  While 2019 novel coronavirus  (SARS-CoV-2) nucleic acids may be present in the submitted sample  additional confirmatory testing may be necessary for epidemiological  and / or clinical management purposes  to differentiate between  SARS-CoV-2 and other Sarbecovirus currently known to infect humans.  If clinically indicated additional testing with an alternate test  methodology [phone]([id number]) is advised. The SARS-CoV-2 RNA is generally  detectable in upper and lower respiratory sp ecimens during the acute  phase of infection. The expected result is Negative. Fact Sheet for Patients:  BoilerBrush.com.cyhttps://www.fda.gov/media/136312/download Fact Sheet for Healthcare Providers: https://pope.com/https://www.fda.gov/media/136313/download This test is not yet approved or cleared by the [place] FDA and has been authorized for  detection and/or diagnosis of SARS-CoV-2 by FDA under an Emergency Use Authorization (EUA).  This EUA will remain in effect (meaning this test can be used) for the duration of the COVID-19 declaration under Section 564(b)(1) of the Act, 21 U.S.C. section 360bbb-3(b)(1), unless the authorization is terminated or revoked sooner. Performed at [facility], [address] [place], [place] [address]   Urine Culture     Status: Abnormal   Collection Time: 12/16/18  6:10 PM   Specimen: Urine, Catheterized  Result Value Ref Range Status   Specimen Description   Final    URINE, CATHETERIZED Performed at [facility], [address] [place], [place] [address]    Special Requests   Final    NONE Performed at [facility], [address] [place], [place] [address]    Culture (A)  Final    70,000 COLONIES/mL GROUP B STREP(S.AGALACTIAE)ISOLATED TESTING AGAINST S. AGALACTIAE NOT ROUTINELY PERFORMED DUE TO PREDICTABILITY OF AMP/PEN/VAN SUSCEPTIBILITY. Performed at [facility], [address] [place], [place] [address]    Report Status 12/18/2018 FINAL  Final  SARS Coronavirus 2 (CEPHEID - Performed in [facility]), Hosp Order     Status: None   Collection Time: 12/19/18  8:32 AM   Specimen: Nasopharyngeal Swab  Result Value Ref Range Status   SARS Coronavirus 2 NEGATIVE NEGATIVE Final    Comment: (NOTE) If result is NEGATIVE SARS-CoV-2 target nucleic acids are NOT DETECTED. The SARS-CoV-2 RNA is generally detectable in upper and lower  respiratory specimens during the acute phase of infection. The lowest  concentration of SARS-CoV-2 viral copies this assay can detect is 250  copies / mL. A negative result does not preclude SARS-CoV-2 infection  and should not be used as the sole basis for treatment or other  patient management decisions.  A negative result may occur with  improper specimen collection / handling, submission of specimen other  than nasopharyngeal swab,  presence of viral mutation(s) within the  areas targeted by this assay, and inadequate number of viral copies  (<250 copies / mL). A negative result must be combined with clinical  observations, patient history, and epidemiological information. If result is POSITIVE SARS-CoV-2 target nucleic acids are DETECTED. The SARS-CoV-2 RNA is generally detectable in upper and lower  respiratory specimens dur ing the acute phase of infection.  Positive  results are indicative of active infection with SARS-CoV-2.  Clinical  correlation with patient history and other diagnostic information is  necessary to determine patient infection status.  Positive results do  not rule out bacterial infection or co-infection with other viruses. If result is PRESUMPTIVE POSTIVE SARS-CoV-2 nucleic acids MAY BE PRESENT.   A presumptive positive result was obtained on the submitted specimen  and confirmed on repeat testing.  While 2019 novel coronavirus  (SARS-CoV-2) nucleic acids may be present in the submitted sample  additional confirmatory testing may be necessary for epidemiological  and / or clinical management purposes  to differentiate between  SARS-CoV-2 and other Sarbecovirus currently known to infect humans.  If clinically indicated additional testing with an alternate test  methodology [phone]([id number]) is advised. The SARS-CoV-2 RNA is generally  detectable in upper and lower respiratory sp ecimens during the acute  phase of infection. The expected result is Negative. Fact Sheet for Patients:  BoilerBrush.com.cyhttps://www.fda.gov/media/136312/download Fact Sheet for Healthcare Providers: https://pope.com/https://www.fda.gov/media/136313/download This test is not yet approved or cleared by the [place] FDA and has been authorized for detection and/or diagnosis of SARS-CoV-2 by FDA under an Emergency Use Authorization (EUA).  This EUA will remain in effect (meaning this test can be used) for the duration of the COVID-19 declaration under  Section 564(b)(1) of the Act, 21 U.S.C. section 360bbb-3(b)(1), unless the authorization is terminated or revoked sooner. Performed at [facility], [address] [place], [place] [address]   SARS Coronavirus 2 (CEPHEID - Performed in [facility] hospital lab), Hosp Order     Status: None   Collection Time: 12/23/18 11:30 PM   Specimen: Nasopharyngeal Swab  Result Value Ref Range Status   SARS Coronavirus 2 NEGATIVE NEGATIVE Final    Comment: (NOTE) If result is NEGATIVE SARS-CoV-2 target nucleic acids are NOT DETECTED. The SARS-CoV-2 RNA is generally detectable in upper and lower  respiratory specimens during the acute phase of infection. The lowest  concentration of SARS-CoV-2 viral copies this assay can detect is 250  copies / mL. A negative result does not preclude SARS-CoV-2 infection  and should not be used as the sole basis for treatment or other  patient management decisions.  A negative result may occur with  improper specimen collection / handling, submission of specimen other  than nasopharyngeal swab, presence of viral mutation(s) within the  areas targeted by this assay, and inadequate number of viral copies  (<250 copies / mL). A negative result must be combined with clinical  observations, patient history, and epidemiological information. If result is POSITIVE SARS-CoV-2 target nucleic acids are DETECTED. The SARS-CoV-2 RNA is generally detectable in upper and lower  respiratory specimens dur ing the acute phase of infection.  Positive  results are indicative of active infection with SARS-CoV-2.  Clinical  correlation with patient history and other diagnostic information is  necessary to determine patient infection status.  Positive results do  not rule out bacterial infection or co-infection with other viruses. If result is PRESUMPTIVE POSTIVE SARS-CoV-2 nucleic acids MAY BE PRESENT.   A presumptive positive result was obtained on the submitted specimen  and  confirmed on repeat testing.  While 2019 novel coronavirus  (SARS-CoV-2) nucleic acids may be present in the submitted sample  additional confirmatory testing may be necessary for epidemiological  and / or clinical management purposes  to differentiate between  SARS-CoV-2 and other Sarbecovirus currently known to infect humans.  If clinically indicated additional testing with an alternate test  methodology [phone]([id number]) is advised. The SARS-CoV-2 RNA is generally  detectable in upper and lower respiratory sp ecimens during the acute  phase  of infection. The expected result is Negative. Fact Sheet for Patients:  BoilerBrush.com.cy Fact Sheet for Healthcare Providers: https://pope.com/ This test is not yet approved or cleared by the [place] FDA and has been authorized for detection and/or diagnosis of SARS-CoV-2 by FDA under an Emergency Use Authorization (EUA).  This EUA will remain in effect (meaning this test can be used) for the duration of the COVID-19 declaration under Section 564(b)(1) of the Act, 21 U.S.C. section 360bbb-3(b)(1), unless the authorization is terminated or revoked sooner. Performed at [facility], [address], [place], [place] [address]     Radiology Reports Dg Chest 1 View  Result Date: 12/16/2018 CLINICAL DATA:  Decreased appetite with weakness since 11/27/2018. Vomiting. Back pain since fall 3 days ago. EXAM: CHEST  1 VIEW COMPARISON:  11/07/2018 FINDINGS: Lungs are adequately inflated and otherwise clear. Cardiomediastinal silhouette is normal. Minimal degenerative change of the spine. IMPRESSION: No acute findings. Electronically Signed   By: Elberta Fortis M.D.   On: 12/16/2018 15:56   Dg Thoracic Spine 2 View  Result Date: 12/23/2018 CLINICAL DATA:  Mid thoracic pain after a fall. EXAM: THORACIC SPINE 2 VIEWS COMPARISON:  Chest x-ray 12/23/2018 and 12/16/2018 FINDINGS: There is normal alignment of the  thoracic spine. No evidence for acute fracture or subluxation. Mild degenerative changes are seen in the LOWER thoracic and LOWER cervical levels. IMPRESSION: No evidence for acute abnormality. Electronically Signed   By: Norva Pavlov M.D.   On: 12/23/2018 17:36   Dg Lumbar Spine Complete  Result Date: 12/16/2018 CLINICAL DATA:  48 year old presenting with acute low back pain related to recent falls. EXAM: LUMBAR SPINE - COMPLETE 4+ VIEW COMPARISON:  Bone window images from CT abdomen and pelvis 11/07/2018. FINDINGS: Five non-rib-bearing lumbar vertebrae with anatomic alignment. No fractures. Well-preserved disc spaces. No pars defects. No significant facet arthropathy. Degenerative disc disease and spondylosis involving visualized lower thoracic spine. Sacroiliac joints anatomically aligned without degenerative changes. Aortoiliac atherosclerosis with without evidence of aneurysm. IMPRESSION: 1. Normal lumbar spine. 2. Degenerative disc disease and spondylosis involving the visualized lower thoracic spine. 3.  Aortic Atherosclerosis (ICD10-170.0) Electronically Signed   By: Hulan Saas M.D.   On: 12/16/2018 16:06   Ct Head Wo Contrast  Result Date: 12/16/2018 CLINICAL DATA:  Head trauma EXAM: CT HEAD WITHOUT CONTRAST TECHNIQUE: Contiguous axial images were obtained from the base of the skull through the vertex without intravenous contrast. COMPARISON:  CT maxillofacial dated November 26, 2015. FINDINGS: Brain: No evidence of acute infarction, hemorrhage, hydrocephalus, extra-axial collection or mass lesion/mass effect. There is some mild volume loss on what greater than expected for the patient's stated age. Examination is degraded by motion artifact. Vascular: No hyperdense vessel or unexpected calcification. Skull: Normal. Negative for fracture or focal lesion. Sinuses/Orbits: There is mild mucosal thickening of the ethmoid air cells and left sphenoid sinus. Otherwise, the remaining paranasal sinuses  and mastoid air cells are clear. Other: None. IMPRESSION: No acute intracranial abnormality. Electronically Signed   By: Katherine Mantle M.D.   On: 12/16/2018 18:24   Dg Chest Portable 1 View  Result Date: 12/23/2018 CLINICAL DATA:  Acute chest pain EXAM: PORTABLE CHEST 1 VIEW COMPARISON:  12/16/2018 and prior radiographs FINDINGS: The cardiomediastinal silhouette is unremarkable. There is no evidence of focal airspace disease, pulmonary edema, suspicious pulmonary nodule/mass, pleural effusion, or pneumothorax. No acute bony abnormalities are identified. IMPRESSION: No active disease. Electronically Signed   By: Harmon Pier M.D.   On: 12/23/2018 16:36  Dg Abd Acute 2+v W 1v Chest  Result Date: 12/25/2018 CLINICAL DATA:  Emesis EXAM: DG ABDOMEN ACUTE W/ 1V CHEST COMPARISON:  11/07/2018 FINDINGS: Cardiomediastinal silhouette is normal. The lungs are free of focal consolidations and pleural effusions. No pulmonary edema. Supine and erect views of the abdomen show a nonobstructive bowel gas pattern. No organomegaly. No abnormal calcifications. Visualized osseous structures have a normal appearance. IMPRESSION: Negative abdominal radiographs. No acute cardiopulmonary disease. Electronically Signed   By: Norva PavlovElizabeth  Brown M.D.   On: 12/25/2018 09:58     CBC Recent Labs  Lab 12/19/18 0559 12/20/18 0617 12/23/18 1601 12/24/18 0457  WBC 13.5* 7.7 14.5* 10.7*  HGB 16.2 13.1 15.2 12.2*  HCT 46.9 39.3 44.7 36.9*  PLT 268 215 276 234  MCV 90.2 92.3 91.8 93.2  MCH 31.2 30.8 31.2 30.8  MCHC 34.5 33.3 34.0 33.1  RDW 11.8 11.9 12.5 12.3  LYMPHSABS 2.2  --  2.7  --   MONOABS 0.5  --  0.6  --   EOSABS 0.1  --  0.1  --   BASOSABS 0.1  --  0.1  --     Chemistries  Recent Labs  Lab 12/19/18 0559 12/20/18 0617 12/23/18 1601 12/23/18 2304 12/24/18 0457 12/25/18 0712  NA 136 138 139 138 137 138  K 3.9 3.6 4.1 4.4 4.1 3.2*  CL 98 104 99 100 101 103  CO2 18* 24 19* 22 25 26   GLUCOSE 182* 122*  120* 133* 99 115*  BUN 22* 9 17 18 19 15   CREATININE 1.32* 0.95 1.76* 1.59* 1.47* 1.16  CALCIUM 9.1 8.3* 8.7* 8.2* 7.9* 7.9*  MG  --  1.4*  --   --   --   --   AST 13* 11* 19  --  12*  --   ALT 11 9 17   --  12  --   ALKPHOS 79 58 80  --  62  --   BILITOT 1.8* 1.1 1.7*  --  0.9  --    ------------------------------------------------------------------------------------------------------------------ No results for input(s): CHOL, HDL, LDLCALC, TRIG, CHOLHDL, LDLDIRECT in the last 72 hours.  Lab Results  Component Value Date   HGBA1C 12.8 (H) 11/07/2018   ------------------------------------------------------------------------------------------------------------------ No results for input(s): TSH, T4TOTAL, T3FREE, THYROIDAB in the last 72 hours.  Invalid input(s): FREET3 ------------------------------------------------------------------------------------------------------------------ No results for input(s): VITAMINB12, FOLATE, FERRITIN, TIBC, IRON, RETICCTPCT in the last 72 hours.  Coagulation profile No results for input(s): INR, PROTIME in the last 168 hours.  Recent Labs    12/23/18 1601  DDIMER 0.39    Cardiac Enzymes No results for input(s): CKMB, TROPONINI, MYOGLOBIN in the last 168 hours.  Invalid input(s): CK ------------------------------------------------------------------------------------------------------------------ No results found for: BNP   Shon Haleourage Carmel Waddington M.D on 12/25/2018 at 5:33 PM  Go to [email or web address] - for contact info  [facility] - Office  [phone]

## 2018-12-25 NOTE — Progress Notes (Signed)
Initial Nutrition Assessment  DOCUMENTATION CODES:      INTERVENTION:  Ensure Enlive po BID, each supplement provides 350 kcal and 20 grams of protein. Patient prefers either vanilla or chocolate.  Place meals in bowls if desired by patient- to decrease intensity of smells  Obtain re-weight to confirm significant change  NUTRITION DIAGNOSIS:   Inadequate oral intake related to nausea, vomiting(hx of gastroparesis) as evidenced by per patient/family report, 10% percent weight loss, mild fat depletion, mild muscle depletion.   GOAL:  Patient will meet greater than or equal to 90% of their needs    MONITOR: tolerance of po intake, labs and wt trends     REASON FOR ASSESSMENT:   Malnutrition Screening Tool    ASSESSMENT: Patient is a 48 yo male who presents with nausea and vomiting. He lives with his parents. PMH: COPD, CAD, DM-2, HTN, HLD and depression.   Diet is fully advanced and he is tolerating. Patient says he was diagnosed around 2 years ago with gastroparesis. He vomits intermittently. Patient does not take a scheduled antiemetic but uses Pepto Bismol regularly. Complains that smells often trigger nausea. Discussed plating his food in separate bowls to decrease the intensity of smells if he prefers.    His weight is down significantly-10% (7-8 kg) during the past 6 weeks. Patient says he weighed as much as 250 lb when initially diagnosed with gastroparesis.   Medications reviewed and include: Lipitor, Prozac, Protonix, SSI, Reglan, Nicoderm patch.  Labs: BMP Latest Ref Rng & Units 12/25/2018 12/24/2018 12/23/2018  Glucose 70 - 99 mg/dL 115(H) 99 133(H)  BUN 6 - 20 mg/dL 15 19 18   Creatinine 0.61 - 1.24 mg/dL 1.16 1.47(H) 1.59(H)  Sodium 135 - 145 mmol/L 138 137 138  Potassium 3.5 - 5.1 mmol/L 3.2(L) 4.1 4.4  Chloride 98 - 111 mmol/L 103 101 100  CO2 22 - 32 mmol/L 26 25 22   Calcium 8.9 - 10.3 mg/dL 7.9(L) 7.9(L) 8.2(L)     NUTRITION - FOCUSED PHYSICAL EXAM:   Most Recent Value  Orbital Region  No depletion  Upper Arm Region  Mild depletion  Buccal Region  No depletion  Temple Region  No depletion  Clavicle Bone Region  Mild depletion  Clavicle and Acromion Bone Region  Mild depletion  Scapular Bone Region  Mild depletion  Dorsal Hand  No depletion  Posterior Calf Region  No depletion  Edema (RD Assessment)  None  Hair  Reviewed  Eyes  Reviewed  Skin  Reviewed      Diet Order:   Diet Order            Diet heart healthy/carb modified Room service appropriate? Yes; Fluid consistency: Thin  Diet effective now              EDUCATION NEEDS:   Education needs have been addressed Skin:  Skin Assessment: Reviewed RN Assessment  Last BM:  7/26  Height:   Ht Readings from Last 1 Encounters:  12/24/18 5\' 9"  (1.753 m)    Weight:   Wt Readings from Last 1 Encounters:  12/24/18 69.5 kg    Ideal Body Weight:  73 kg  BMI:  Body mass index is 22.63 kg/m.  Estimated Nutritional Needs:   Kcal:  6387-5643  Protein:  84-91 gr  Fluid:  >2 liters daily   Colman Cater MS,RD,CSG,LDN Office: [phone] Pager: [phone]

## 2018-12-25 NOTE — Progress Notes (Signed)
Has eaten well today and has had no complaints of nausea or vomiting.  To be NPO after midnight for EGD tomorrow.

## 2018-12-26 ENCOUNTER — Encounter (HOSPITAL_COMMUNITY)
Admission: EM | Disposition: A | Discharge status: Home / Self Care | Payer: Self-pay | Source: Home / Self Care | Attending: Family Medicine

## 2018-12-26 DIAGNOSIS — K228 Other specified diseases of esophagus: Secondary | ICD-10-CM

## 2018-12-26 DIAGNOSIS — K3189 Other diseases of stomach and duodenum: Secondary | ICD-10-CM

## 2018-12-26 DIAGNOSIS — K2270 Barrett's esophagus without dysplasia: Secondary | ICD-10-CM

## 2018-12-26 DIAGNOSIS — K210 Gastro-esophageal reflux disease with esophagitis: Secondary | ICD-10-CM

## 2018-12-26 HISTORY — PX: ESOPHAGOGASTRODUODENOSCOPY: SHX5428

## 2018-12-26 HISTORY — PX: BIOPSY: SHX5522

## 2018-12-26 LAB — GLUCOSE, CAPILLARY
Glucose-Capillary: 101 mg/dL — ABNORMAL HIGH (ref 70–99)
Glucose-Capillary: 103 mg/dL — ABNORMAL HIGH (ref 70–99)
Glucose-Capillary: 115 mg/dL — ABNORMAL HIGH (ref 70–99)
Glucose-Capillary: 51 mg/dL — ABNORMAL LOW (ref 70–99)
Glucose-Capillary: 51 mg/dL — ABNORMAL LOW (ref 70–99)

## 2018-12-26 SURGERY — EGD (ESOPHAGOGASTRODUODENOSCOPY)
Anesthesia: Moderate Sedation

## 2018-12-26 MED ORDER — MIDAZOLAM HCL 5 MG/5ML IJ SOLN
INTRAMUSCULAR | Status: AC
  Filled 2018-12-26: qty 10

## 2018-12-26 MED ORDER — MIDAZOLAM HCL 5 MG/5ML IJ SOLN
INTRAMUSCULAR | Status: DC | PRN
  Administered 2018-12-26 (×2): 2 mg via INTRAVENOUS
  Administered 2018-12-26: 1 mg via INTRAVENOUS

## 2018-12-26 MED ORDER — MEPERIDINE HCL 50 MG/ML IJ SOLN
INTRAMUSCULAR | Status: DC | PRN
  Administered 2018-12-26 (×2): 25 mg via INTRAVENOUS

## 2018-12-26 MED ORDER — LIDOCAINE VISCOUS HCL 2 % MT SOLN
OROMUCOSAL | Status: AC
  Filled 2018-12-26: qty 15

## 2018-12-26 MED ORDER — PANTOPRAZOLE SODIUM 40 MG PO TBEC: 40.0000 mg | DELAYED_RELEASE_TABLET | Freq: Two times a day (BID) | ORAL | 3 refills | Status: DC

## 2018-12-26 MED ORDER — SODIUM CHLORIDE 0.9 % IV SOLN: INTRAVENOUS | Status: DC

## 2018-12-26 MED ORDER — SODIUM CHLORIDE 0.9 % IV SOLN
INTRAVENOUS | Status: AC | PRN
  Administered 2018-12-26: 40 mL via INTRAVENOUS

## 2018-12-26 MED ORDER — DEXTROSE 50 % IV SOLN
INTRAVENOUS | Status: AC
  Administered 2018-12-26: 25 mL via INTRAVENOUS
  Filled 2018-12-26: qty 50

## 2018-12-26 MED ORDER — LIDOCAINE VISCOUS HCL 2 % MT SOLN
OROMUCOSAL | Status: DC | PRN
  Administered 2018-12-26: 6 mL via OROMUCOSAL

## 2018-12-26 MED ORDER — MEPERIDINE HCL 50 MG/ML IJ SOLN
INTRAMUSCULAR | Status: AC
  Filled 2018-12-26: qty 1

## 2018-12-26 MED ORDER — ENSURE ENLIVE PO LIQD: 237.0000 mL | Freq: Two times a day (BID) | ORAL | 12 refills | Status: DC

## 2018-12-26 MED ORDER — ENOXAPARIN SODIUM 40 MG/0.4ML ~~LOC~~ SOLN: 40.0000 mg | SUBCUTANEOUS | Status: DC

## 2018-12-26 MED ORDER — PANTOPRAZOLE SODIUM 40 MG PO TBEC
40.0000 mg | DELAYED_RELEASE_TABLET | Freq: Two times a day (BID) | ORAL | Status: DC
  Administered 2018-12-26: 40 mg via ORAL
  Filled 2018-12-26: qty 1

## 2018-12-26 MED ORDER — DEXTROSE 50 % IV SOLN
25.0000 mL | Freq: Once | INTRAVENOUS | Status: AC
  Administered 2018-12-26: 25 mL via INTRAVENOUS

## 2018-12-26 MED ORDER — STERILE WATER FOR IRRIGATION IR SOLN
Status: DC | PRN
  Administered 2018-12-26: 08:00:00 4 mL

## 2018-12-26 NOTE — Progress Notes (Signed)
Pam from pre-op called for report.  Patient notified to urinate before they come up.

## 2018-12-26 NOTE — Discharge Summary (Signed)
Physician Discharge Summary  [name] [name] ZOX:[id number] DOB: 18-May-1971 DOA: 12/23/2018  PCP: [name], PA-C  Admit date: 12/23/2018  Discharge date: 12/26/2018  Admitted From:Home  Disposition:  Home  Recommendations for Outpatient Follow-up:  1. Follow up with PCP in 1-2 weeks 2. Follow-up with Dr.Rehman as scheduled in next few weeks; repeat EGD planned in 3 months 3. Continue PPI twice daily 4. Continue other home medications as previously prescribed  Home Health: None  Equipment/Devices: None  Discharge Condition: Stable  CODE STATUS: Full  Diet recommendation: Heart Healthy/carb modified  Brief/Interim Summary: 48 y.o.male,with history of type 2 diabetes mellitus, diabetic gastroparesis, depression, sleep apnea not on CPAP, dyslipidemia, CAD, COPD, hypertension, Barrett's esophagus readmitted on 12/23/2018 with intractable emesis and anion gap metabolic acidosis.  Patient was evaluated by GI and underwent EGD on 7/28 demonstrating grade D esophagitis without any significant signs of bleeding or gastroparesis noted.  Duodenal biopsies were obtained and will be followed up in the outpatient setting.  He has been recommended to remain on Protonix twice daily and to remain on home Reglan.  Patient was quite eager for discharge and did not stick around to have breakfast or lunch, but was able to have some snacks which he tolerated well and did not have any other further symptomatology noted.  He agrees to remain on PPI twice daily and follow-up with Dr.Rehman in the near future as recommended.  Patient was also noted to have uncontrolled type 2 diabetes as well as some AKI that has improved.  He will require repeat BMP in the next 1 week.   Discharge Diagnoses:  Principal Problem:   Intractable nausea and vomiting Active Problems:   Gastroesophageal reflux disease   Anxiety with depression   HTN (hypertension)   AKI (acute kidney injury) (HCC)   Uncontrolled  diabetes mellitus (HCC)   MDD (major depressive disorder), severe (HCC)  Principal discharge diagnosis: Grade D esophagitis with possible gastroparesis.  Discharge Instructions  Discharge Instructions    Diet - low sodium heart healthy   Complete by: As directed    Increase activity slowly   Complete by: As directed      Allergies as of 12/26/2018      Reactions   Omeprazole Magnesium Swelling   Face swells, no breathing impairment   Esomeprazole Swelling   Face swells, no breathing impairment      Medication List    STOP taking these medications   famotidine 40 MG tablet Commonly known as: Pepcid     TAKE these medications   acetaminophen 325 MG tablet Commonly known as: TYLENOL Take 2 tablets (650 mg total) by mouth every 6 (six) hours as needed for moderate pain.   ARIPiprazole 2 MG tablet Commonly known as: ABILIFY Take 2 mg by mouth daily.   aspirin 81 MG tablet Take 81 mg by mouth at bedtime.   atorvastatin 80 MG tablet Commonly known as: LIPITOR Take 1 tablet (80 mg total) by mouth daily.   buPROPion 150 MG 12 hr tablet Commonly known as: WELLBUTRIN SR Take 300 mg by mouth daily.   busPIRone 5 MG tablet Commonly known as: BUSPAR Take 5 mg by mouth 3 (three) times daily.   feeding supplement (ENSURE ENLIVE) Liqd Take 237 mLs by mouth 2 (two) times daily between meals.   gabapentin 300 MG capsule Commonly known as: NEURONTIN Take 1 capsule (300 mg total) by mouth 2 (two) times daily.   insulin aspart 100 UNIT/ML injection Commonly known as: NovoLOG  Before each meal 3 times a day, 140-199 - 2 units, 200-250 - 4 units, 251-299 - 6 units,  300-349 - 8 units,  350 or above 10 units. Dispense syringes and needles as needed, Ok to switch to PEN if approved. Substitute to any brand approved. DX DM2, Code E11.65   Insulin Glargine 100 UNIT/ML Solostar Pen Commonly known as: Lantus SoloStar Inject 40 Units into the skin at bedtime.   isosorbide  mononitrate 30 MG 24 hr tablet Commonly known as: IMDUR Take 1 tablet (30 mg total) by mouth daily.   metFORMIN 1000 MG tablet Commonly known as: GLUCOPHAGE Take 1 tablet (1,000 mg total) by mouth 2 (two) times daily with a meal.   metoCLOPramide 5 MG tablet Commonly known as: Reglan Take 1 tablet (5 mg total) by mouth 3 (three) times daily before meals.   metoprolol tartrate 25 MG tablet Commonly known as: LOPRESSOR Take 25 mg by mouth 2 (two) times daily.   nicotine 7 mg/24hr patch Commonly known as: NICODERM CQ - dosed in mg/24 hr Place 1 patch (7 mg total) onto the skin daily.   nitroGLYCERIN 0.4 MG SL tablet Commonly known as: NITROSTAT Place 1 tablet (0.4 mg total) under the tongue every 5 (five) minutes as needed for chest pain.   ondansetron 4 MG disintegrating tablet Commonly known as: Zofran ODT Take 1 tablet (4 mg total) by mouth every 8 (eight) hours as needed for nausea.   pantoprazole 40 MG tablet Commonly known as: PROTONIX Take 1 tablet (40 mg total) by mouth 2 (two) times daily before a meal.   promethazine 25 MG suppository Commonly known as: PHENERGAN Place 1 suppository (25 mg total) rectally every 8 (eight) hours as needed for refractory nausea / vomiting.   PROzac 40 MG capsule Generic drug: FLUoxetine Take 40 mg by mouth every morning.      Follow-up Information    Jacquelin HawkingMcElroy, Shannon, PA-C Follow up on 12/28/2018.   Specialty: Physician Assistant Why: Thursday at 10:00 by phone with the Dr. for your hospital follow up appointment.  She will call you then. Contact information: [address] [place] [address] [phone]        Jonelle SidleMcDowell, Samuel G, MD .   Specialty: Cardiology Contact information: [address] [place] [address] [phone]        Malissa Hippoehman, Najeeb U, MD Follow up in 4 week(s).   Specialty: Gastroenterology Contact information: [address], [address] [place] [address] [phone]           Allergies  Allergen Reactions  . Omeprazole Magnesium Swelling    Face swells, no breathing impairment  . Esomeprazole Swelling    Face swells, no breathing impairment    Consultations:  GI Dr.Rehman   Procedures/Studies: Dg Chest 1 View  Result Date: 12/16/2018 CLINICAL DATA:  Decreased appetite with weakness since 11/27/2018. Vomiting. Back pain since fall 3 days ago. EXAM: CHEST  1 VIEW COMPARISON:  11/07/2018 FINDINGS: Lungs are adequately inflated and otherwise clear. Cardiomediastinal silhouette is normal. Minimal degenerative change of the spine. IMPRESSION: No acute findings. Electronically Signed   By: Elberta Fortisaniel  Boyle M.D.   On: 12/16/2018 15:56   Dg Thoracic Spine 2 View  Result Date: 12/23/2018 CLINICAL DATA:  Mid thoracic pain after a fall. EXAM: THORACIC SPINE 2 VIEWS COMPARISON:  Chest x-ray 12/23/2018 and 12/16/2018 FINDINGS: There is normal alignment of the thoracic spine. No evidence for acute fracture or subluxation. Mild degenerative changes are seen in  the LOWER thoracic and LOWER cervical levels. IMPRESSION: No evidence for acute abnormality. Electronically Signed   By: Norva PavlovElizabeth  Brown M.D.   On: 12/23/2018 17:36   Dg Lumbar Spine Complete  Result Date: 12/16/2018 CLINICAL DATA:  48 year old presenting with acute low back pain related to recent falls. EXAM: LUMBAR SPINE - COMPLETE 4+ VIEW COMPARISON:  Bone window images from CT abdomen and pelvis 11/07/2018. FINDINGS: Five non-rib-bearing lumbar vertebrae with anatomic alignment. No fractures. Well-preserved disc spaces. No pars defects. No significant facet arthropathy. Degenerative disc disease and spondylosis involving visualized lower thoracic spine. Sacroiliac joints anatomically aligned without degenerative changes. Aortoiliac atherosclerosis with without evidence of aneurysm. IMPRESSION: 1. Normal lumbar spine. 2. Degenerative disc disease and spondylosis involving the visualized lower thoracic spine. 3.  Aortic  Atherosclerosis (ICD10-170.0) Electronically Signed   By: Hulan Saashomas  Lawrence M.D.   On: 12/16/2018 16:06   Ct Head Wo Contrast  Result Date: 12/16/2018 CLINICAL DATA:  Head trauma EXAM: CT HEAD WITHOUT CONTRAST TECHNIQUE: Contiguous axial images were obtained from the base of the skull through the vertex without intravenous contrast. COMPARISON:  CT maxillofacial dated November 26, 2015. FINDINGS: Brain: No evidence of acute infarction, hemorrhage, hydrocephalus, extra-axial collection or mass lesion/mass effect. There is some mild volume loss on what greater than expected for the patient's stated age. Examination is degraded by motion artifact. Vascular: No hyperdense vessel or unexpected calcification. Skull: Normal. Negative for fracture or focal lesion. Sinuses/Orbits: There is mild mucosal thickening of the ethmoid air cells and left sphenoid sinus. Otherwise, the remaining paranasal sinuses and mastoid air cells are clear. Other: None. IMPRESSION: No acute intracranial abnormality. Electronically Signed   By: Katherine Mantlehristopher  Green M.D.   On: 12/16/2018 18:24   Dg Chest Portable 1 View  Result Date: 12/23/2018 CLINICAL DATA:  Acute chest pain EXAM: PORTABLE CHEST 1 VIEW COMPARISON:  12/16/2018 and prior radiographs FINDINGS: The cardiomediastinal silhouette is unremarkable. There is no evidence of focal airspace disease, pulmonary edema, suspicious pulmonary nodule/mass, pleural effusion, or pneumothorax. No acute bony abnormalities are identified. IMPRESSION: No active disease. Electronically Signed   By: Harmon PierJeffrey  Hu M.D.   On: 12/23/2018 16:36   Dg Abd Acute 2+v W 1v Chest  Result Date: 12/25/2018 CLINICAL DATA:  Emesis EXAM: DG ABDOMEN ACUTE W/ 1V CHEST COMPARISON:  11/07/2018 FINDINGS: Cardiomediastinal silhouette is normal. The lungs are free of focal consolidations and pleural effusions. No pulmonary edema. Supine and erect views of the abdomen show a nonobstructive bowel gas pattern. No organomegaly.  No abnormal calcifications. Visualized osseous structures have a normal appearance. IMPRESSION: Negative abdominal radiographs. No acute cardiopulmonary disease. Electronically Signed   By: Norva PavlovElizabeth  Brown M.D.   On: 12/25/2018 09:58      Subjective:   Discharge Exam: Vitals:   12/26/18 0810 12/26/18 0833  BP: 121/68 122/67  Pulse: 70 70  Resp: 17   Temp:  97.8 F (36.6 C)  SpO2: 98% 98%   Vitals:   12/26/18 0800 12/26/18 0805 12/26/18 0810 12/26/18 0833  BP: 121/71 127/69 121/68 122/67  Pulse: 70 70 70 70  Resp: 18 18 17    Temp:    97.8 F (36.6 C)  TempSrc:    Oral  SpO2: 99% 97% 98% 98%  Weight:      Height:        General: Pt is alert, awake, not in acute distress Cardiovascular: RRR, S1/S2 +, no rubs, no gallops Respiratory: CTA bilaterally, no wheezing, no rhonchi Abdominal: Soft, NT, ND, bowel sounds +  Extremities: no edema, no cyanosis    The results of significant diagnostics from this hospitalization (including imaging, microbiology, ancillary and laboratory) are listed below for reference.     Microbiology: Recent Results (from the past 240 hour(s))  SARS Coronavirus 2 (CEPHEID- Performed in [facility] hospital lab), Hosp Order     Status: None   Collection Time: 12/16/18  2:34 PM   Specimen: Nasopharyngeal Swab  Result Value Ref Range Status   SARS Coronavirus 2 NEGATIVE NEGATIVE Final    Comment: (NOTE) If result is NEGATIVE SARS-CoV-2 target nucleic acids are NOT DETECTED. The SARS-CoV-2 RNA is generally detectable in upper and lower  respiratory specimens during the acute phase of infection. The lowest  concentration of SARS-CoV-2 viral copies this assay can detect is 250  copies / mL. A negative result does not preclude SARS-CoV-2 infection  and should not be used as the sole basis for treatment or other  patient management decisions.  A negative result may occur with  improper specimen collection / handling, submission of specimen other   than nasopharyngeal swab, presence of viral mutation(s) within the  areas targeted by this assay, and inadequate number of viral copies  (<250 copies / mL). A negative result must be combined with clinical  observations, patient history, and epidemiological information. If result is POSITIVE SARS-CoV-2 target nucleic acids are DETECTED. The SARS-CoV-2 RNA is generally detectable in upper and lower  respiratory specimens dur ing the acute phase of infection.  Positive  results are indicative of active infection with SARS-CoV-2.  Clinical  correlation with patient history and other diagnostic information is  necessary to determine patient infection status.  Positive results do  not rule out bacterial infection or co-infection with other viruses. If result is PRESUMPTIVE POSTIVE SARS-CoV-2 nucleic acids MAY BE PRESENT.   A presumptive positive result was obtained on the submitted specimen  and confirmed on repeat testing.  While 2019 novel coronavirus  (SARS-CoV-2) nucleic acids may be present in the submitted sample  additional confirmatory testing may be necessary for epidemiological  and / or clinical management purposes  to differentiate between  SARS-CoV-2 and other Sarbecovirus currently known to infect humans.  If clinically indicated additional testing with an alternate test  methodology [phone]) is advised. The SARS-CoV-2 RNA is generally  detectable in upper and lower respiratory sp ecimens during the acute  phase of infection. The expected result is Negative. Fact Sheet for Patients:  BoilerBrush.com.cy Fact Sheet for Healthcare Providers: https://pope.com/ This test is not yet approved or cleared by the [place] FDA and has been authorized for detection and/or diagnosis of SARS-CoV-2 by FDA under an Emergency Use Authorization (EUA).  This EUA will remain in effect (meaning this test can be used) for the duration of  the COVID-19 declaration under Section 564(b)(1) of the Act, 21 U.S.C. section 360bbb-3(b)(1), unless the authorization is terminated or revoked sooner. Performed at [facility], [address], [place], [place] [address]   Urine Culture     Status: Abnormal   Collection Time: 12/16/18  6:10 PM   Specimen: Urine, Catheterized  Result Value Ref Range Status   Specimen Description   Final    URINE, CATHETERIZED Performed at [facility], [address] [place], [place] [address]    Special Requests   Final    NONE Performed at [facility], [address], [place], [place] [address]    Culture (A)  Final    70,000 COLONIES/mL GROUP B STREP(S.AGALACTIAE)ISOLATED TESTING AGAINST  S. AGALACTIAE NOT ROUTINELY PERFORMED DUE TO PREDICTABILITY OF AMP/PEN/VAN SUSCEPTIBILITY. Performed at [facility], [address], [place], [place] [address]    Report Status 12/18/2018 FINAL  Final  SARS Coronavirus 2 (CEPHEID - Performed in [facility] hospital lab), Hosp Order     Status: None   Collection Time: 12/19/18  8:32 AM   Specimen: Nasopharyngeal Swab  Result Value Ref Range Status   SARS Coronavirus 2 NEGATIVE NEGATIVE Final    Comment: (NOTE) If result is NEGATIVE SARS-CoV-2 target nucleic acids are NOT DETECTED. The SARS-CoV-2 RNA is generally detectable in upper and lower  respiratory specimens during the acute phase of infection. The lowest  concentration of SARS-CoV-2 viral copies this assay can detect is 250  copies / mL. A negative result does not preclude SARS-CoV-2 infection  and should not be used as the sole basis for treatment or other  patient management decisions.  A negative result may occur with  improper specimen collection / handling, submission of specimen other  than nasopharyngeal swab, presence of viral mutation(s) within the  areas targeted by this assay, and inadequate number of viral copies  (<250 copies / mL). A negative result must be combined with clinical   observations, patient history, and epidemiological information. If result is POSITIVE SARS-CoV-2 target nucleic acids are DETECTED. The SARS-CoV-2 RNA is generally detectable in upper and lower  respiratory specimens dur ing the acute phase of infection.  Positive  results are indicative of active infection with SARS-CoV-2.  Clinical  correlation with patient history and other diagnostic information is  necessary to determine patient infection status.  Positive results do  not rule out bacterial infection or co-infection with other viruses. If result is PRESUMPTIVE POSTIVE SARS-CoV-2 nucleic acids MAY BE PRESENT.   A presumptive positive result was obtained on the submitted specimen  and confirmed on repeat testing.  While 2019 novel coronavirus  (SARS-CoV-2) nucleic acids may be present in the submitted sample  additional confirmatory testing may be necessary for epidemiological  and / or clinical management purposes  to differentiate between  SARS-CoV-2 and other Sarbecovirus currently known to infect humans.  If clinically indicated additional testing with an alternate test  methodology [phone]) is advised. The SARS-CoV-2 RNA is generally  detectable in upper and lower respiratory sp ecimens during the acute  phase of infection. The expected result is Negative. Fact Sheet for Patients:  BoilerBrush.com.cy Fact Sheet for Healthcare Providers: https://pope.com/ This test is not yet approved or cleared by the [place] FDA and has been authorized for detection and/or diagnosis of SARS-CoV-2 by FDA under an Emergency Use Authorization (EUA).  This EUA will remain in effect (meaning this test can be used) for the duration of the COVID-19 declaration under Section 564(b)(1) of the Act, 21 U.S.C. section 360bbb-3(b)(1), unless the authorization is terminated or revoked sooner. Performed at [facility], [address],  [place], [place] [address]   SARS Coronavirus 2 (CEPHEID - Performed in [facility] hospital lab), Hosp Order     Status: None   Collection Time: 12/23/18 11:30 PM   Specimen: Nasopharyngeal Swab  Result Value Ref Range Status   SARS Coronavirus 2 NEGATIVE NEGATIVE Final    Comment: (NOTE) If result is NEGATIVE SARS-CoV-2 target nucleic acids are NOT DETECTED. The SARS-CoV-2 RNA is generally detectable in upper and lower  respiratory specimens during the acute phase of infection. The lowest  concentration of SARS-CoV-2 viral copies this assay can detect is 250  copies / mL. A negative result does not preclude SARS-CoV-2 infection  and should not be used as the sole basis for treatment or other  patient management decisions.  A negative result may occur with  improper specimen collection / handling, submission of specimen other  than nasopharyngeal swab, presence of viral mutation(s) within the  areas targeted by this assay, and inadequate number of viral copies  (<250 copies / mL). A negative result must be combined with clinical  observations, patient history, and epidemiological information. If result is POSITIVE SARS-CoV-2 target nucleic acids are DETECTED. The SARS-CoV-2 RNA is generally detectable in upper and lower  respiratory specimens dur ing the acute phase of infection.  Positive  results are indicative of active infection with SARS-CoV-2.  Clinical  correlation with patient history and other diagnostic information is  necessary to determine patient infection status.  Positive results do  not rule out bacterial infection or co-infection with other viruses. If result is PRESUMPTIVE POSTIVE SARS-CoV-2 nucleic acids MAY BE PRESENT.   A presumptive positive result was obtained on the submitted specimen  and confirmed on repeat testing.  While 2019 novel coronavirus  (SARS-CoV-2) nucleic acids may be present in the submitted sample  additional confirmatory testing may be necessary  for epidemiological  and / or clinical management purposes  to differentiate between  SARS-CoV-2 and other Sarbecovirus currently known to infect humans.  If clinically indicated additional testing with an alternate test  methodology [phone]) is advised. The SARS-CoV-2 RNA is generally  detectable in upper and lower respiratory sp ecimens during the acute  phase of infection. The expected result is Negative. Fact Sheet for Patients:  BoilerBrush.com.cy Fact Sheet for Healthcare Providers: https://pope.com/ This test is not yet approved or cleared by the [place] FDA and has been authorized for detection and/or diagnosis of SARS-CoV-2 by FDA under an Emergency Use Authorization (EUA).  This EUA will remain in effect (meaning this test can be used) for the duration of the COVID-19 declaration under Section 564(b)(1) of the Act, 21 U.S.C. section 360bbb-3(b)(1), unless the authorization is terminated or revoked sooner. Performed at [facility], [address] [place], [place] [address]      Labs: BNP (last 3 results) No results for input(s): BNP in the last 8760 hours. Basic Metabolic Panel: Recent Labs  Lab 12/20/18 0617 12/23/18 1601 12/23/18 2304 12/24/18 0457 12/25/18 0712  NA 138 139 138 137 138  K 3.6 4.1 4.4 4.1 3.2*  CL 104 99 100 101 103  CO2 24 19* 22 25 26   GLUCOSE 122* 120* 133* 99 115*  BUN 9 17 18 19 15   CREATININE 0.95 1.76* 1.59* 1.47* 1.16  CALCIUM 8.3* 8.7* 8.2* 7.9* 7.9*  MG 1.4*  --   --   --   --    Liver Function Tests: Recent Labs  Lab 12/20/18 0617 12/23/18 1601 12/24/18 0457  AST 11* 19 12*  ALT 9 17 12   ALKPHOS 58 80 62  BILITOT 1.1 1.7* 0.9  PROT 5.4* 7.2 5.3*  ALBUMIN 2.9* 3.7 2.8*   Recent Labs  Lab 12/23/18 1601  LIPASE 14   No results for input(s): AMMONIA in the last 168 hours. CBC: Recent Labs  Lab 12/20/18 0617 12/23/18 1601 12/24/18 0457  WBC 7.7 14.5* 10.7*   NEUTROABS  --  11.0*  --   HGB 13.1 15.2 12.2*  HCT 39.3 44.7 36.9*  MCV 92.3 91.8 93.2  PLT 215 276 234   Cardiac Enzymes: No results  for input(s): CKTOTAL, CKMB, CKMBINDEX, TROPONINI in the last 168 hours. BNP: Invalid input(s): POCBNP CBG: Recent Labs  Lab 12/26/18 0011 12/26/18 0034 12/26/18 0114 12/26/18 0400 12/26/18 0721  GLUCAP 51* 51* 115* 103* 101*   D-Dimer Recent Labs    12/23/18 1601  DDIMER 0.39   Hgb A1c No results for input(s): HGBA1C in the last 72 hours. Lipid Profile No results for input(s): CHOL, HDL, LDLCALC, TRIG, CHOLHDL, LDLDIRECT in the last 72 hours. Thyroid function studies No results for input(s): TSH, T4TOTAL, T3FREE, THYROIDAB in the last 72 hours.  Invalid input(s): FREET3 Anemia work up No results for input(s): VITAMINB12, FOLATE, FERRITIN, TIBC, IRON, RETICCTPCT in the last 72 hours. Urinalysis    Component Value Date/Time   COLORURINE YELLOW 12/23/2018 2330   APPEARANCEUR HAZY (A) 12/23/2018 2330   LABSPEC 1.015 12/23/2018 2330   PHURINE 5.0 12/23/2018 2330   GLUCOSEU NEGATIVE 12/23/2018 2330   HGBUR NEGATIVE 12/23/2018 2330   BILIRUBINUR NEGATIVE 12/23/2018 2330   BILIRUBINUR negative 04/05/2018 1022   KETONESUR 20 (A) 12/23/2018 2330   PROTEINUR 30 (A) 12/23/2018 2330   UROBILINOGEN 0.2 04/05/2018 1022   NITRITE NEGATIVE 12/23/2018 2330   LEUKOCYTESUR NEGATIVE 12/23/2018 2330   Sepsis Labs Invalid input(s): PROCALCITONIN,  WBC,  LACTICIDVEN Microbiology Recent Results (from the past 240 hour(s))  SARS Coronavirus 2 (CEPHEID- Performed in [facility] hospital lab), Hosp Order     Status: None   Collection Time: 12/16/18  2:34 PM   Specimen: Nasopharyngeal Swab  Result Value Ref Range Status   SARS Coronavirus 2 NEGATIVE NEGATIVE Final    Comment: (NOTE) If result is NEGATIVE SARS-CoV-2 target nucleic acids are NOT DETECTED. The SARS-CoV-2 RNA is generally detectable in upper and lower  respiratory specimens during  the acute phase of infection. The lowest  concentration of SARS-CoV-2 viral copies this assay can detect is 250  copies / mL. A negative result does not preclude SARS-CoV-2 infection  and should not be used as the sole basis for treatment or other  patient management decisions.  A negative result may occur with  improper specimen collection / handling, submission of specimen other  than nasopharyngeal swab, presence of viral mutation(s) within the  areas targeted by this assay, and inadequate number of viral copies  (<250 copies / mL). A negative result must be combined with clinical  observations, patient history, and epidemiological information. If result is POSITIVE SARS-CoV-2 target nucleic acids are DETECTED. The SARS-CoV-2 RNA is generally detectable in upper and lower  respiratory specimens dur ing the acute phase of infection.  Positive  results are indicative of active infection with SARS-CoV-2.  Clinical  correlation with patient history and other diagnostic information is  necessary to determine patient infection status.  Positive results do  not rule out bacterial infection or co-infection with other viruses. If result is PRESUMPTIVE POSTIVE SARS-CoV-2 nucleic acids MAY BE PRESENT.   A presumptive positive result was obtained on the submitted specimen  and confirmed on repeat testing.  While 2019 novel coronavirus  (SARS-CoV-2) nucleic acids may be present in the submitted sample  additional confirmatory testing may be necessary for epidemiological  and / or clinical management purposes  to differentiate between  SARS-CoV-2 and other Sarbecovirus currently known to infect humans.  If clinically indicated additional testing with an alternate test  methodology [phone]) is advised. The SARS-CoV-2 RNA is generally  detectable in upper and lower respiratory sp ecimens during the acute  phase of infection. The expected result  is Negative. Fact Sheet for Patients:   BoilerBrush.com.cyhttps://www.fda.gov/media/136312/download Fact Sheet for Healthcare Providers: https://pope.com/https://www.fda.gov/media/136313/download This test is not yet approved or cleared by the [place] FDA and has been authorized for detection and/or diagnosis of SARS-CoV-2 by FDA under an Emergency Use Authorization (EUA).  This EUA will remain in effect (meaning this test can be used) for the duration of the COVID-19 declaration under Section 564(b)(1) of the Act, 21 U.S.C. section 360bbb-3(b)(1), unless the authorization is terminated or revoked sooner. Performed at [facility], [address] [place], [place] [address]   Urine Culture     Status: Abnormal   Collection Time: 12/16/18  6:10 PM   Specimen: Urine, Catheterized  Result Value Ref Range Status   Specimen Description   Final    URINE, CATHETERIZED Performed at [facility], [address] [place], [place] [address]    Special Requests   Final    NONE Performed at [facility], [address] [place], [place] [address]    Culture (A)  Final    70,000 COLONIES/mL GROUP B STREP(S.AGALACTIAE)ISOLATED TESTING AGAINST S. AGALACTIAE NOT ROUTINELY PERFORMED DUE TO PREDICTABILITY OF AMP/PEN/VAN SUSCEPTIBILITY. Performed at [facility], [address] [place], [place] [address]    Report Status 12/18/2018 FINAL  Final  SARS Coronavirus 2 ([place] - Performed in [facility] hospital lab), Hosp Order     Status: None   Collection Time: 12/19/18  8:32 AM   Specimen: Nasopharyngeal Swab  Result Value Ref Range Status   SARS Coronavirus 2 NEGATIVE NEGATIVE Final    Comment: (NOTE) If result is NEGATIVE SARS-CoV-2 target nucleic acids are NOT DETECTED. The SARS-CoV-2 RNA is generally detectable in upper and lower  respiratory specimens during the acute phase of infection. The lowest  concentration of SARS-CoV-2 viral copies this assay can detect is 250  copies / mL. A negative result does not preclude SARS-CoV-2 infection  and should not  be used as the sole basis for treatment or other  patient management decisions.  A negative result may occur with  improper specimen collection / handling, submission of specimen other  than nasopharyngeal swab, presence of viral mutation(s) within the  areas targeted by this assay, and inadequate number of viral copies  (<250 copies / mL). A negative result must be combined with clinical  observations, patient history, and epidemiological information. If result is POSITIVE SARS-CoV-2 target nucleic acids are DETECTED. The SARS-CoV-2 RNA is generally detectable in upper and lower  respiratory specimens dur ing the acute phase of infection.  Positive  results are indicative of active infection with SARS-CoV-2.  Clinical  correlation with patient history and other diagnostic information is  necessary to determine patient infection status.  Positive results do  not rule out bacterial infection or co-infection with other viruses. If result is PRESUMPTIVE POSTIVE SARS-CoV-2 nucleic acids MAY BE PRESENT.   A presumptive positive result was obtained on the submitted specimen  and confirmed on repeat testing.  While 2019 novel coronavirus  (SARS-CoV-2) nucleic acids may be present in the submitted sample  additional confirmatory testing may be necessary for epidemiological  and / or clinical management purposes  to differentiate between  SARS-CoV-2 and other Sarbecovirus currently known to infect humans.  If clinically indicated additional testing with an alternate test  methodology [phone]([id number]) is advised. The SARS-CoV-2 RNA is generally  detectable in upper and lower respiratory sp ecimens during the acute  phase of infection. The expected result is Negative. Fact Sheet for  Patients:  StrictlyIdeas.no Fact Sheet for Healthcare Providers: BankingDealers.co.za This test is not yet approved or cleared by the [place] FDA and has been authorized  for detection and/or diagnosis of SARS-CoV-2 by FDA under an Emergency Use Authorization (EUA).  This EUA will remain in effect (meaning this test can be used) for the duration of the COVID-19 declaration under Section 564(b)(1) of the Act, 21 U.S.C. section 360bbb-3(b)(1), unless the authorization is terminated or revoked sooner. Performed at [facility], [address] [place], [place] [address]   SARS Coronavirus 2 ([place] - Performed in [facility] hospital lab), Hosp Order     Status: None   Collection Time: 12/23/18 11:30 PM   Specimen: Nasopharyngeal Swab  Result Value Ref Range Status   SARS Coronavirus 2 NEGATIVE NEGATIVE Final    Comment: (NOTE) If result is NEGATIVE SARS-CoV-2 target nucleic acids are NOT DETECTED. The SARS-CoV-2 RNA is generally detectable in upper and lower  respiratory specimens during the acute phase of infection. The lowest  concentration of SARS-CoV-2 viral copies this assay can detect is 250  copies / mL. A negative result does not preclude SARS-CoV-2 infection  and should not be used as the sole basis for treatment or other  patient management decisions.  A negative result may occur with  improper specimen collection / handling, submission of specimen other  than nasopharyngeal swab, presence of viral mutation(s) within the  areas targeted by this assay, and inadequate number of viral copies  (<250 copies / mL). A negative result must be combined with clinical  observations, patient history, and epidemiological information. If result is POSITIVE SARS-CoV-2 target nucleic acids are DETECTED. The SARS-CoV-2 RNA is generally detectable in upper and lower  respiratory specimens dur ing the acute phase of infection.  Positive  results are indicative of active infection with SARS-CoV-2.  Clinical  correlation with patient history and other diagnostic information is  necessary to determine patient infection status.  Positive results do  not rule out  bacterial infection or co-infection with other viruses. If result is PRESUMPTIVE POSTIVE SARS-CoV-2 nucleic acids MAY BE PRESENT.   A presumptive positive result was obtained on the submitted specimen  and confirmed on repeat testing.  While 2019 novel coronavirus  (SARS-CoV-2) nucleic acids may be present in the submitted sample  additional confirmatory testing may be necessary for epidemiological  and / or clinical management purposes  to differentiate between  SARS-CoV-2 and other Sarbecovirus currently known to infect humans.  If clinically indicated additional testing with an alternate test  methodology [phone]) is advised. The SARS-CoV-2 RNA is generally  detectable in upper and lower respiratory sp ecimens during the acute  phase of infection. The expected result is Negative. Fact Sheet for Patients:  StrictlyIdeas.no Fact Sheet for Healthcare Providers: BankingDealers.co.za This test is not yet approved or cleared by the [place] FDA and has been authorized for detection and/or diagnosis of SARS-CoV-2 by FDA under an Emergency Use Authorization (EUA).  This EUA will remain in effect (meaning this test can be used) for the duration of the COVID-19 declaration under Section 564(b)(1) of the Act, 21 U.S.C. section 360bbb-3(b)(1), unless the authorization is terminated or revoked sooner. Performed at [facility], [address] [place], [place] [address]      Time coordinating discharge: 40 minutes  SIGNED:   Rodena Goldmann, DO [facility] 12/26/2018, 11:26 AM  If 7PM-7AM, please contact night-coverage [email or web address] Password [email or web address]

## 2018-12-26 NOTE — TOC Initial Note (Signed)
Transition of Care [facility](TOC) - Initial/Assessment Note    Patient Details  Name: [name] [name] MRN: [id number] Date of Birth: 1970-08-17  Transition of Care [facility](TOC) CM/SW Contact:    Ida Rogueodney B Zyree Traynham, LCSW Phone Number: 12/26/2018, 10:59 AM  Clinical Narrative:  Mr [name] [name] is a 48 YO Caucasian single male being treated for intractable nausea and vomiting. He states that he lives at home with his parents and his brother, where he has always resided, and that he uses a cane or his father's rollator if need be for ambulation outside of the house.  He takes care of all his ADL's and has no further needs in terms of DME. Furthermore, he [facility], and his medical care through the free clinic, and they also set him up with medications.  He states it has been about a year since he last worked, and was fired for missing too many days due to medical issues, and has since applied for disability through the help of an attorney. He is still in the process of evaluation.  His family provides transportation for him, and he recently was awarded food stamps at the rate of $194. Per month.  He plans to return home at d/c, follow up with his clinics, and has no further needs at this point.  CSW sign off.                Expected Discharge Plan: Home/Self Care Barriers to Discharge: No Barriers Identified   Patient Goals and CMS Choice Patient states their goals for this hospitalization and ongoing recovery are:: I'm feeling better today.  I'm getting scoped tomorrow."      Expected Discharge Plan and Services Expected Discharge Plan: Home/Self Care   Discharge Planning Services: CM Consult   Living arrangements for the past 2 months: Single Family Home Expected Discharge Date: 12/26/18                                    Prior Living Arrangements/Services Living arrangements for the past 2 months: Single Family Home Lives with:: Parents, Siblings Patient language  and need for interpreter reviewed:: Yes Do you feel safe going back to the place where you live?: Yes      Need for Family Participation in Patient Care: Yes (Comment) Care giver support system in place?: Yes (comment) Current home services: DME Criminal Activity/Legal Involvement Pertinent to Current Situation/Hospitalization: No - Comment as needed  Activities of Daily Living Home Assistive Devices/Equipment: Blood pressure cuff, CBG Meter ADL Screening (condition at time of admission) Patient's cognitive ability adequate to safely complete daily activities?: Yes Is the patient deaf or have difficulty hearing?: No Does the patient have difficulty seeing, even when wearing glasses/contacts?: No Does the patient have difficulty concentrating, remembering, or making decisions?: No Patient able to express need for assistance with ADLs?: Yes Does the patient have difficulty dressing or bathing?: No Independently performs ADLs?: Yes (appropriate for developmental age) Does the patient have difficulty walking or climbing stairs?: No Weakness of Legs: None Weakness of Arms/Hands: None  Permission Sought/Granted                  Emotional Assessment Appearance:: Appears stated age Attitude/Demeanor/Rapport: Engaged Affect (typically observed): Appropriate Orientation: : Oriented to Self, Oriented to Place, Oriented to  Time, Oriented to Situation Alcohol / Substance Use: Not Applicable Psych Involvement: Yes (comment)  Admission  diagnosis:  High anion gap metabolic acidosis [V37.1] Chest pain, unspecified type [R07.9] Non-intractable vomiting with nausea, unspecified vomiting type [R11.2] Patient Active Problem List   Diagnosis Date Noted  . MDD (major depressive disorder), severe ([place]) 12/17/2018  . DKA, type 2 ([place]) 11/07/2018  . High anion gap metabolic acidosis 11/24/9483  . Epigastric abdominal pain 03/22/2018  . Nausea vomiting and diarrhea 03/22/2018  . AKI (acute  kidney injury) ([place]) 03/22/2018  . Uncontrolled diabetes mellitus ([place]) 03/22/2018  . CAD (coronary artery disease) 02/15/2018  . Hyperlipidemia 02/15/2018  . Tobacco abuse 02/15/2018  . Diabetic gastroparesis ([place]) 06/02/2017  . ARF (acute renal failure) ([place]) 05/30/2017  . DKA (diabetic ketoacidoses) ([place]) 10/15/2016  . Gastroesophageal reflux disease 10/15/2016  . Anxiety with depression 10/15/2016  . HTN (hypertension) 10/15/2016  . Leukocytosis 11/25/2015  . Intractable nausea and vomiting 11/25/2015  . Dental abscess 11/25/2015  . Nausea and vomiting 11/25/2015  . Chest pain 03/27/2015   PCP:  Soyla Dryer, PA-C Pharmacy:   [place], [place], [place] [address], [place] [address] Phone: [phone] Fax: [phone]  [facility] [address], [place] [address] [place] [address] [place] [address] [place] [address] Phone: [phone] Fax: [phone]  [place] - [address] [place] [address] Phone: [phone] Fax: [phone]     Social Determinants of Health (SDOH) Interventions    Readmission Risk Interventions No flowsheet data found.

## 2018-12-26 NOTE — Progress Notes (Signed)
Patient has not experienced any reaction or side effects with pantoprazole.

## 2018-12-26 NOTE — Op Note (Addendum)
[facility] Patient Name: [name] [name] Procedure Date: 12/26/2018 7:01 AM MRN: [id number] Date of Birth: 1971/04/08 Attending MD: Hildred Laser , MD CSN: [id number] Age: 48 Admit Type: Inpatient Procedure:                Upper GI endoscopy Indications:              Nausea with vomiting Providers:                Hildred Laser, MD, Hinton Rao, RN, Aram Candela Referring MD:             Roxan Hockey, MD Medicines:                Lidocaine jelly, Meperidine 50 mg IV, Midazolam 5                            mg IV Complications:            No immediate complications. Estimated Blood Loss:     Estimated blood loss was minimal. Procedure:                Pre-Anesthesia Assessment:                           - Prior to the procedure, a History and Physical                            was performed, and patient medications and                            allergies were reviewed. The patient's tolerance of                            previous anesthesia was also reviewed. The risks                            and benefits of the procedure and the sedation                            options and risks were discussed with the patient.                            All questions were answered, and informed consent                            was obtained. Prior Anticoagulants: The patient                            last took Lovenox (enoxaparin) 1 day prior to the                            procedure. ASA Grade Assessment: III - A patient                            with severe systemic disease. After reviewing the  risks and benefits, the patient was deemed in                            satisfactory condition to undergo the procedure.                           After obtaining informed consent, the endoscope was                            passed under direct vision. Throughout the                            procedure, the patient's blood pressure, pulse, and               oxygen saturations were monitored continuously. The                            [id number]-[id number] ([id number] scope was introduced through the                            mouth, and advanced to the second part of duodenum.                            The upper GI endoscopy was accomplished without                            difficulty. The patient tolerated the procedure                            well. Scope In: 7:39:24 AM Scope Out: 7:46:12 AM Total Procedure Duration: 0 hours 6 minutes 48 seconds  Findings:      The proximal esophagus was normal.      LA Grade D (one or more mucosal breaks involving at least 75% of       esophageal circumference) esophagitis with no bleeding was found 28 to       37 cm from the incisors.      There were esophageal mucosal changes secondary to established       short-segment Barrett's disease present in the distal esophagus.      The Z-line was irregular and was found 37 cm from the incisors.      The entire examined stomach was normal.      The duodenal bulb was normal.      Patchy mild mucosal changes characterized by discoloration, granularity       and altered texture were found in the second portion of the duodenum.       Biopsies were taken with a cold forceps for histology. Impression:               - Normal proximal esophagus.                           - LA Grade D reflux esophagitis.                           - Esophageal mucosal changes secondary to  established short-segment Barrett's disease. Exact                            length cannot be estimated because of ulceration.                           - Z-line irregular, 37 cm from the incisors.                           - Normal stomach.                           - Normal duodenal bulb.                           - Mucosal changes in the duodenum. Biopsied. Moderate Sedation:      Moderate (conscious) sedation was administered by the endoscopy nurse       and  supervised by the endoscopist. The following parameters were       monitored: oxygen saturation, heart rate, blood pressure, CO2       capnography and response to care. Total physician intraservice time was       13 minutes. Recommendation:           - Return patient to hospital ward for ongoing care.                           - Diabetic (ADA) diet today.                           - Continue present medications. Metoclopromide can                            be changed to oral route at the time of discharge.                           - Pantoprazole changed to oral route.                           - Resume Lovenox (enoxaparin) at prior dose                            tomorrow.                           - Await pathology results.                           - Repeat upper endoscopy in 3 months to check                            healing. Procedure Code(s):        --- Professional ---                           [phone], Esophagogastroduodenoscopy, flexible,  transoral; with biopsy, single or multiple                           G0500, Moderate sedation services provided by the                            same physician or other qualified health care                            professional performing a gastrointestinal                            endoscopic service that sedation supports,                            requiring the presence of an independent trained                            observer to assist in the monitoring of the                            patient's level of consciousness and physiological                            status; initial 15 minutes of intra-service time;                            patient age 76 years or older (additional time may                            be reported with [id number], as appropriate) Diagnosis Code(s):        --- Professional ---                           K22.70, Barrett's esophagus without dysplasia                           K21.0,  Gastro-esophageal reflux disease with                            esophagitis                           K22.8, Other specified diseases of esophagus                           K31.89, Other diseases of stomach and duodenum                           R11.2, Nausea with vomiting, unspecified CPT copyright 2019 American Medical Association. All rights reserved. The codes documented in this report are preliminary and upon coder review may  be revised to meet current compliance requirements. Lionel DecemberNajeeb Rehman, MD Lionel DecemberNajeeb Rehman, MD 12/26/2018 8:03:31 AM This report has been signed electronically. Number of Addenda: 0

## 2018-12-26 NOTE — Progress Notes (Signed)
Nsg Discharge Note  Admit Date:  12/23/2018 Discharge date: 12/26/2018   Dannielle Huhanny L Tuccillo to be D/C'd Home per MD order.  AVS completed.  Copy for chart, and copy for patient signed, and dated. Patient/caregiver able to verbalize understanding.  Discharge Medication: Allergies as of 12/26/2018      Reactions   Omeprazole Magnesium Swelling   Face swells, no breathing impairment   Esomeprazole Swelling   Face swells, no breathing impairment      Medication List    STOP taking these medications   famotidine 40 MG tablet Commonly known as: Pepcid     TAKE these medications   acetaminophen 325 MG tablet Commonly known as: TYLENOL Take 2 tablets (650 mg total) by mouth every 6 (six) hours as needed for moderate pain.   ARIPiprazole 2 MG tablet Commonly known as: ABILIFY Take 2 mg by mouth daily.   aspirin 81 MG tablet Take 81 mg by mouth at bedtime.   atorvastatin 80 MG tablet Commonly known as: LIPITOR Take 1 tablet (80 mg total) by mouth daily.   buPROPion 150 MG 12 hr tablet Commonly known as: WELLBUTRIN SR Take 300 mg by mouth daily.   busPIRone 5 MG tablet Commonly known as: BUSPAR Take 5 mg by mouth 3 (three) times daily.   feeding supplement (ENSURE ENLIVE) Liqd Take 237 mLs by mouth 2 (two) times daily between meals.   gabapentin 300 MG capsule Commonly known as: NEURONTIN Take 1 capsule (300 mg total) by mouth 2 (two) times daily.   insulin aspart 100 UNIT/ML injection Commonly known as: NovoLOG Before each meal 3 times a day, 140-199 - 2 units, 200-250 - 4 units, 251-299 - 6 units,  300-349 - 8 units,  350 or above 10 units. Dispense syringes and needles as needed, Ok to switch to PEN if approved. Substitute to any brand approved. DX DM2, Code E11.65   Insulin Glargine 100 UNIT/ML Solostar Pen Commonly known as: Lantus SoloStar Inject 40 Units into the skin at bedtime.   isosorbide mononitrate 30 MG 24 hr tablet Commonly known as: IMDUR Take 1 tablet  (30 mg total) by mouth daily.   metFORMIN 1000 MG tablet Commonly known as: GLUCOPHAGE Take 1 tablet (1,000 mg total) by mouth 2 (two) times daily with a meal.   metoCLOPramide 5 MG tablet Commonly known as: Reglan Take 1 tablet (5 mg total) by mouth 3 (three) times daily before meals.   metoprolol tartrate 25 MG tablet Commonly known as: LOPRESSOR Take 25 mg by mouth 2 (two) times daily.   nicotine 7 mg/24hr patch Commonly known as: NICODERM CQ - dosed in mg/24 hr Place 1 patch (7 mg total) onto the skin daily.   nitroGLYCERIN 0.4 MG SL tablet Commonly known as: NITROSTAT Place 1 tablet (0.4 mg total) under the tongue every 5 (five) minutes as needed for chest pain.   ondansetron 4 MG disintegrating tablet Commonly known as: Zofran ODT Take 1 tablet (4 mg total) by mouth every 8 (eight) hours as needed for nausea.   pantoprazole 40 MG tablet Commonly known as: PROTONIX Take 1 tablet (40 mg total) by mouth 2 (two) times daily before a meal.   promethazine 25 MG suppository Commonly known as: PHENERGAN Place 1 suppository (25 mg total) rectally every 8 (eight) hours as needed for refractory nausea / vomiting.   PROzac 40 MG capsule Generic drug: FLUoxetine Take 40 mg by mouth every morning.       Discharge Assessment: Vitals:  12/26/18 0810 12/26/18 0833  BP: 121/68 122/67  Pulse: 70 70  Resp: 17   Temp:  97.8 F (36.6 C)  SpO2: 98% 98%   Skin clean, dry and intact without evidence of skin break down, no evidence of skin tears noted. IV catheter discontinued intact. Site without signs and symptoms of complications - no redness or edema noted at insertion site, patient denies c/o pain - only slight tenderness at site.  Dressing with slight pressure applied.  D/c Instructions-Education: Discharge instructions given to patient/family with verbalized understanding. D/c education completed with patient/family including follow up instructions, medication list, d/c  activities limitations if indicated, with other d/c instructions as indicated by MD - patient able to verbalize understanding, all questions fully answered. Patient instructed to return to ED, call 911, or call MD for any changes in condition.  Patient escorted via [place], and D/C home via private auto.  Loa Socks, RN 12/26/2018 10:54 AM

## 2018-12-26 NOTE — Progress Notes (Signed)
  Brief EGD note:  Normal mucosa of the proximal esophagus. Extensive ulceration involving 9 cm of distal esophageal mucosa.  Grade D esophagitis. Salmon-colored mucosa at distal esophagus.  Extent cannot be determined because of ulceration. 3 cm sliding  hiatal hernia. Normal examination of the stomach. Normal bulbar mucosa. Abnormal appearance to post bulbar mucosa.  Biopsy taken.

## 2018-12-26 NOTE — Progress Notes (Signed)
Pt's CBG is 51.  Juice provided at this time.  Will reassess in 15 minutes.

## 2018-12-28 ENCOUNTER — Ambulatory Visit: Payer: Medicaid Other | Admitting: Physician Assistant

## 2018-12-28 ENCOUNTER — Encounter: Payer: Self-pay | Admitting: Physician Assistant

## 2018-12-28 DIAGNOSIS — F17200 Nicotine dependence, unspecified, uncomplicated: Secondary | ICD-10-CM

## 2018-12-28 DIAGNOSIS — I10 Essential (primary) hypertension: Secondary | ICD-10-CM

## 2018-12-28 DIAGNOSIS — E1165 Type 2 diabetes mellitus with hyperglycemia: Secondary | ICD-10-CM

## 2018-12-28 DIAGNOSIS — E785 Hyperlipidemia, unspecified: Secondary | ICD-10-CM

## 2018-12-28 DIAGNOSIS — F39 Unspecified mood [affective] disorder: Secondary | ICD-10-CM

## 2018-12-28 DIAGNOSIS — K209 Esophagitis, unspecified without bleeding: Secondary | ICD-10-CM

## 2018-12-28 NOTE — Progress Notes (Signed)
There were no vitals taken for this visit.   Subjective:    Patient ID: [name] [name], male    DOB: Feb 16, 1971, 48 y.o.   MRN: [id number]  HPI: [name] [name] is a 48 y.o. male presenting on 12/28/2018 for No chief complaint on file.   HPI    This is a telemedicine appointment due to coronavirus pandemic.  It is via telephone as pt does not have video capabilities on telephone or computer  I connected with  [place] on 12/28/18 by a video enabled telemedicine application and verified that I am speaking with the correct person using two identifiers.   I discussed the limitations of evaluation and management by telemedicine. The patient expressed understanding and agreed to proceed.  Pt is at home.  Provider is at office.     Pt just Got out of hospital Tuesday 7/28 with diagnoses N/V and esophagitis with possible gastroparesis.    He had just been discharged on 7/22 with uncontrolled DM when he had presented with emesis and dehydration.    Pt is still feeling sluggish but is not having pain.  He is eating without difficulty  Pt has follow up with GI in August  Pt is still going to [facility] for [place] but he hasn't been for a while.  Pt says he has been checking his bs and fbs 80-180  lately.  A1C done in June 12.8     Relevant past medical, surgical, family and social history reviewed and updated as indicated. Interim medical history since our last visit reviewed. Allergies and medications reviewed and updated.    Current Outpatient Medications:  .  acetaminophen (TYLENOL) 325 MG tablet, Take 2 tablets (650 mg total) by mouth every 6 (six) hours as needed for moderate pain., Disp: , Rfl:  .  aspirin 81 MG tablet, Take 81 mg by mouth at bedtime. , Disp: , Rfl:  .  atorvastatin (LIPITOR) 80 MG tablet, Take 1 tablet (80 mg total) by mouth daily., Disp: 90 tablet, Rfl: 1 .  busPIRone (BUSPAR) 5 MG tablet, Take 5 mg by mouth 3 (three) times daily., Disp: , Rfl:  .   feeding supplement, ENSURE ENLIVE, (ENSURE ENLIVE) LIQD, Take 237 mLs by mouth 2 (two) times daily between meals., Disp: 237 mL, Rfl: 12 .  insulin aspart (NOVOLOG) 100 UNIT/ML injection, Before each meal 3 times a day, 140-199 - 2 units, 200-250 - 4 units, 251-299 - 6 units,  300-349 - 8 units,  350 or above 10 units. Dispense syringes and needles as needed, Ok to switch to PEN if approved. Substitute to any brand approved. DX DM2, Code E11.65, Disp: 1 vial, Rfl: 0 .  Insulin Glargine (LANTUS SOLOSTAR) 100 UNIT/ML Solostar Pen, Inject 40 Units into the skin at bedtime., Disp: 1 pen, Rfl: 3 .  isosorbide mononitrate (IMDUR) 30 MG 24 hr tablet, Take 1 tablet (30 mg total) by mouth daily., Disp: 90 tablet, Rfl: 3 .  metFORMIN (GLUCOPHAGE) 1000 MG tablet, Take 1 tablet (1,000 mg total) by mouth 2 (two) times daily with a meal., Disp: 180 tablet, Rfl: 1 .  metoprolol tartrate (LOPRESSOR) 50 MG tablet, Take 50 mg by mouth 2 (two) times daily., Disp: , Rfl:  .  nitroGLYCERIN (NITROSTAT) 0.4 MG SL tablet, Place 1 tablet (0.4 mg total) under the tongue every 5 (five) minutes as needed for chest pain., Disp: 25 tablet, Rfl: 3 .  ondansetron (ZOFRAN ODT) 4 MG disintegrating tablet, Take 1 tablet (  4 mg total) by mouth every 8 (eight) hours as needed for nausea., Disp: 10 tablet, Rfl: 0 .  promethazine (PHENERGAN) 25 MG suppository, Place 1 suppository (25 mg total) rectally every 8 (eight) hours as needed for refractory nausea / vomiting., Disp: 12 each, Rfl: 0 .  PROZAC 40 MG capsule, Take 40 mg by mouth every morning., Disp: , Rfl:  .  ARIPiprazole (ABILIFY) 2 MG tablet, Take 2 mg by mouth daily., Disp: , Rfl:  .  buPROPion (WELLBUTRIN SR) 150 MG 12 hr tablet, Take 300 mg by mouth daily., Disp: , Rfl:  .  gabapentin (NEURONTIN) 300 MG capsule, Take 1 capsule (300 mg total) by mouth 2 (two) times daily. (Patient not taking: Reported on 12/28/2018), Disp: 60 capsule, Rfl: 2 .  metoCLOPramide (REGLAN) 5 MG tablet,  Take 1 tablet (5 mg total) by mouth 3 (three) times daily before meals. (Patient not taking: Reported on 12/28/2018), Disp: 90 tablet, Rfl: 1 .  metoprolol tartrate (LOPRESSOR) 25 MG tablet, Take 25 mg by mouth 2 (two) times daily. , Disp: , Rfl:  .  nicotine (NICODERM CQ - DOSED IN MG/24 HR) 7 mg/24hr patch, Place 1 patch (7 mg total) onto the skin daily. (Patient not taking: Reported on 12/28/2018), Disp: 28 patch, Rfl: 0 .  pantoprazole (PROTONIX) 40 MG tablet, Take 1 tablet (40 mg total) by mouth 2 (two) times daily before a meal. (Patient not taking: Reported on 12/28/2018), Disp: 60 tablet, Rfl: 3    Review of Systems  Per HPI unless specifically indicated above     Objective:    There were no vitals taken for this visit.  Wt Readings from Last 3 Encounters:  12/24/18 153 lb 3.5 oz (69.5 kg)  12/19/18 156 lb 8.4 oz (71 kg)  12/16/18 (!) 335 lb 1.6 oz (152 kg)    Physical Exam Pulmonary:     Effort: No respiratory distress.  Neurological:     Mental Status: He is alert and oriented to person, place, and time.  Psychiatric:        Attention and Perception: Attention normal.        Speech: Speech normal.        Behavior: Behavior is cooperative.        Assessment & Plan:   Encounter Diagnoses  Name Primary?  Marland Kitchen. Uncontrolled type 2 diabetes mellitus with hyperglycemia (HCC) Yes  . Hyperlipidemia, unspecified hyperlipidemia type   . Esophagitis   . Essential hypertension   . Tobacco use disorder   . Mood disorder (HCC)      -Pt [facility] for counseling and mental health care -Pt was given box of glucerna yesterday -Check bmp, cbc Monday -pt to monitor his bs.  He is reminded to call office for fbs < 70 or > 300 -Pt to keep follow up with GI as scheduled -Pt to follow up here 1 month as scheduled

## 2018-12-29 ENCOUNTER — Encounter (HOSPITAL_COMMUNITY): Payer: Self-pay | Admitting: Internal Medicine

## 2019-01-01 ENCOUNTER — Other Ambulatory Visit (HOSPITAL_COMMUNITY)
Admission: RE | Admit: 2019-01-01 | Discharge: 2019-01-01 | Disposition: A | Discharge status: Home / Self Care | Payer: Medicaid Other | Source: Ambulatory Visit | Attending: Physician Assistant | Admitting: Physician Assistant

## 2019-01-01 ENCOUNTER — Other Ambulatory Visit: Payer: Self-pay

## 2019-01-01 DIAGNOSIS — E1165 Type 2 diabetes mellitus with hyperglycemia: Secondary | ICD-10-CM

## 2019-01-01 LAB — CBC
HCT: 38.9 % — ABNORMAL LOW (ref 39.0–52.0)
Hemoglobin: 12.6 g/dL — ABNORMAL LOW (ref 13.0–17.0)
MCH: 31 pg (ref 26.0–34.0)
MCHC: 32.4 g/dL (ref 30.0–36.0)
MCV: 95.6 fL (ref 80.0–100.0)
Platelets: 316 10*3/uL (ref 150–400)
RBC: 4.07 MIL/uL — ABNORMAL LOW (ref 4.22–5.81)
RDW: 12.6 % (ref 11.5–15.5)
WBC: 9.3 10*3/uL (ref 4.0–10.5)
nRBC: 0 % (ref 0.0–0.2)

## 2019-01-01 LAB — BASIC METABOLIC PANEL
Anion gap: 8 (ref 5–15)
BUN: 14 mg/dL (ref 6–20)
CO2: 23 mmol/L (ref 22–32)
Calcium: 8.4 mg/dL — ABNORMAL LOW (ref 8.9–10.3)
Chloride: 105 mmol/L (ref 98–111)
Creatinine, Ser: 1.07 mg/dL (ref 0.61–1.24)
GFR calc Af Amer: >60 mL/min (ref >60)
GFR calc non Af Amer: >60 mL/min (ref >60)
Glucose, Bld: 113 mg/dL — ABNORMAL HIGH (ref 70–99)
Potassium: 5 mmol/L (ref 3.5–5.1)
Sodium: 136 mmol/L (ref 135–145)

## 2019-01-23 ENCOUNTER — Other Ambulatory Visit: Payer: Self-pay | Admitting: Physician Assistant

## 2019-01-23 ENCOUNTER — Other Ambulatory Visit: Payer: Self-pay

## 2019-01-23 ENCOUNTER — Encounter (INDEPENDENT_AMBULATORY_CARE_PROVIDER_SITE_OTHER): Payer: Self-pay | Admitting: Nurse Practitioner

## 2019-01-23 ENCOUNTER — Ambulatory Visit (INDEPENDENT_AMBULATORY_CARE_PROVIDER_SITE_OTHER): Payer: Self-pay | Admitting: Nurse Practitioner

## 2019-01-23 VITALS — BP 150/92 | Temp 98.1°F | Ht 68.0 in | Wt 169.3 lb

## 2019-01-23 DIAGNOSIS — R1084 Generalized abdominal pain: Secondary | ICD-10-CM

## 2019-01-23 DIAGNOSIS — K2100 Gastro-esophageal reflux disease with esophagitis, without bleeding: Secondary | ICD-10-CM

## 2019-01-23 DIAGNOSIS — E1165 Type 2 diabetes mellitus with hyperglycemia: Secondary | ICD-10-CM

## 2019-01-23 DIAGNOSIS — R197 Diarrhea, unspecified: Secondary | ICD-10-CM | POA: Insufficient documentation

## 2019-01-23 DIAGNOSIS — K2210 Ulcer of esophagus without bleeding: Secondary | ICD-10-CM

## 2019-01-23 DIAGNOSIS — K2270 Barrett's esophagus without dysplasia: Secondary | ICD-10-CM

## 2019-01-23 DIAGNOSIS — E785 Hyperlipidemia, unspecified: Secondary | ICD-10-CM

## 2019-01-23 MED ORDER — PANTOPRAZOLE SODIUM 40 MG PO TBEC: 40.0000 mg | DELAYED_RELEASE_TABLET | Freq: Two times a day (BID) | ORAL | 1 refills | Status: DC

## 2019-01-23 NOTE — Progress Notes (Signed)
Subjective:    Patient ID: [name] [name], male    DOB: 1970-12-06, 48 y.o.   MRN: [id number]  HPI [name] [name] is a 48 year old male with a past medical history significant for type 2 diabetes mellitus, diabetic gastroparesis, depression, sleep apnea not on CPAP, dyslipidemia, CAD, COPD, smoker since age 45, hypertension, Barrett's Esophagus. Frequent  hospital admissions with nausea and vomiting. He developed chest pain with nausea and vomiting with hematemesis so he presented to [facility] ER 12/23/2018. Labs: Na 139. K 4.1. BUN 17. Cr. 1.76. Alk phos 80. AST 19. ALT 17. T. Bili 1.7. Lipase 14. Troponin 9. WBC 14.5. Hg 15.2. HCT  44.7. PLT 276. He received IV fluids and his Cr level improved. He underwent an EGD by Dr. Laural Golden 12/23/2018 which showed grade D esophagitis without signs of active GI bleeding or obvious gastroparesis. He was prescribed Protonix 25m po bid and Reglan 575mpo tid and to repeat an EGD in 3 months. Repeat labs 01/01/2019: Na 136. K 5.0. BUN 14. Cr. 1.07.   Currently, he reports feeling pretty well. No further chest pain. He continues to have nausea, no vomiting. He is not able to tolerate acidic or spicy foods. He drinks water without difficulty. He has difficulty swallowing bread, can't swallow bread or pizza like food so he spits it outs. He is passing  1 to 2 loose mud like to soft greenish stools daily.  No rectal bleeding or melena. No NSAID use. CTAP 6/092020 showed possible enteritis. No family history of IBD. No fever, sweats or chills. He reports losing 30 lbs over the past year but has recently gained back 5lbs. He reported having a normal colonoscopy in 2016. He reports having difficulty urinating. He describes feeling as if urine builds up in his bladder and he cannot release the urine. No dysuria.   He was also hospitalized 12/19/2018 with poorly controlled diabetes and gastroparesis flare up. He was discharged home 12/20/2018.  EGD 12/26/2018:  - Normal proximal  esophagus. - LA Grade D reflux esophagitis. - Esophageal mucosal changes secondary to established short-segment Barrett's disease. Exact length cannot be estimated because of ulceration. - Z-line irregular, 37 cm from the incisors. - Normal stomach. - Normal duodenal bulb. - Mucosal changes in the duodenum. Biopsied. Normal mucosa of the proximal esophagus. Extensive ulceration involving 9 cm of distal esophageal mucosa.  Grade D esophagitis. Salmon-colored mucosa at distal esophagus.  Extent cannot be determined because of ulceration. 3 cm sliding  hiatal hernia. Normal examination of the stomach. Normal bulbar mucosa. Abnormal appearance to post bulbar mucosa.  Biopsy taken  Abdominal/pelvic CT 11/07/2018: 1. Minimally dilated proximal small bowel loops in the left abdomen, without small bowel wall thickening or focal small bowel caliber transition. Findings favor a mild adynamic ileus such as due to enteritis. Low-grade mid small-bowel obstruction considered less likely. 2. Chronic diffuse bladder wall thickening, nonspecific, mildly worsened from prior. Correlate with urinalysis as clinically warranted. No hydronephrosis. 3. Mild circumferential wall thickening in the lower thoracic esophagus, nonspecific, mildly worsened from prior, most commonly due to reflux esophagitis, with Barrett's esophagus or neoplasm not excluded. Upper endoscopic correlation may be obtained as clinically warranted. 4.  Aortic Atherosclerosis   Review of Systems See HPI, all other systems reviewed and are negative      Objective:   Physical Exam  BP (!) 150/92   Temp 98.1 F (36.7 C) (Oral)   Ht '5\' 8"'  (1.727 m)   Wt 169 lb  4.8 oz (76.8 kg)   BMI 25.74 kg/m   General: 48 y.o. thin male in NAD Eyes: sclera nonicteric, conjunctiva pink Mouth: dentition intact Neck: supple, no thyromegaly or lymphadenopathy Lungs: clear throughout  Abdomen: soft, nontender, no masses or organomegaly, + BS x  4 quads Rectal: deferred  Extremities: no edema Neuro: alert and oriented x 4, no focal deficits     Assessment & Plan:   1. 48 y.o. male with severe ulcerative esophagitis per EGD, on Protonix 64m bid,  -repeat EGD end of Oct. 2020 to obtain esophageal biopsies to rule out Barrett's - esophagus  -continue Protonix 448mpo bid  2. Gastroparesis with poorly controlled diabetes  -reduce Reglan 21m621mo bid  -HgA1c ordered per PCP  3. Chronic loose stools -will consider diagnostic colonoscopy at time of EGD if no improvement  -Benefiber 1 tablespoon once daily -Imodium 1/2 to 1 tab po daily -if loose stools worsen will check stool cultures, pancreatic elastase   4. History of CAD   5. Urinary wall thickening per abd/pelvic CT 10/2018 with c/o difficulty urinating -advised urology consult, patient to discuss further with his PCP  Follow up in office in 6 weeks

## 2019-01-23 NOTE — Patient Instructions (Signed)
1. Complete the requested blood tests today  2. Schedule an EGD (upper endoscopy) with Dr. Laural Golden end of Oct. 2020  3. Continue Pantoprazole 40mg  one capsule by mouth twice daily  4. Reduce Reglan 5mg  to one tab twice daily   5. Imodium 1/2 to 1 tab once daily  6. Benefiber 1 tablespoon once daily to bulk up stool if tolerated   7. Follow up in office in 6 week, we will discuss scheduling a diagnostic colonoscopy at the time of your EGD if you continue to have loose stool

## 2019-01-25 LAB — COMPLETE METABOLIC PANEL WITH GFR
AG Ratio: 1.4 (calc) (ref 1.0–2.5)
ALT: 11 U/L (ref 9–46)
AST: 10 U/L (ref 10–40)
Albumin: 3.9 g/dL (ref 3.6–5.1)
Alkaline phosphatase (APISO): 98 U/L (ref 36–130)
BUN: 18 mg/dL (ref 7–25)
CO2: 24 mmol/L (ref 20–32)
Calcium: 9 mg/dL (ref 8.6–10.3)
Chloride: 101 mmol/L (ref 98–110)
Creat: 1.05 mg/dL (ref 0.60–1.35)
GFR, Est African American: 96 mL/min/{1.73_m2} (ref >=60)
GFR, Est Non African American: 83 mL/min/{1.73_m2} (ref >=60)
Globulin: 2.8 g/dL (calc) (ref 1.9–3.7)
Glucose, Bld: 440 mg/dL — ABNORMAL HIGH (ref 65–139)
Potassium: 5.5 mmol/L — ABNORMAL HIGH (ref 3.5–5.3)
Sodium: 133 mmol/L — ABNORMAL LOW (ref 135–146)
Total Bilirubin: 0.7 mg/dL (ref 0.2–1.2)
Total Protein: 6.7 g/dL (ref 6.1–8.1)

## 2019-01-25 LAB — CBC WITH DIFFERENTIAL/PLATELET
Absolute Monocytes: 821 cells/uL (ref 200–950)
Basophils Absolute: 57 cells/uL (ref 0–200)
Basophils Relative: 0.5 %
Eosinophils Absolute: 228 cells/uL (ref 15–500)
Eosinophils Relative: 2 %
HCT: 39.9 % (ref 38.5–50.0)
Hemoglobin: 13.2 g/dL (ref 13.2–17.1)
Lymphs Abs: 3158 cells/uL (ref 850–3900)
MCH: 31.1 pg (ref 27.0–33.0)
MCHC: 33.1 g/dL (ref 32.0–36.0)
MCV: 93.9 fL (ref 80.0–100.0)
MPV: 11 fL (ref 7.5–12.5)
Monocytes Relative: 7.2 %
Neutro Abs: 7136 cells/uL (ref 1500–7800)
Neutrophils Relative %: 62.6 %
Platelets: 306 10*3/uL (ref 140–400)
RBC: 4.25 10*6/uL (ref 4.20–5.80)
RDW: 12.9 % (ref 11.0–15.0)
Total Lymphocyte: 27.7 %
WBC: 11.4 10*3/uL — ABNORMAL HIGH (ref 3.8–10.8)

## 2019-01-25 LAB — C-REACTIVE PROTEIN: CRP: 2.5 mg/L (ref <8.0)

## 2019-02-06 ENCOUNTER — Other Ambulatory Visit (HOSPITAL_COMMUNITY)
Admission: RE | Admit: 2019-02-06 | Discharge: 2019-02-06 | Disposition: A | Discharge status: Home / Self Care | Payer: Medicaid Other | Source: Ambulatory Visit | Attending: Physician Assistant | Admitting: Physician Assistant

## 2019-02-06 ENCOUNTER — Other Ambulatory Visit: Payer: Self-pay

## 2019-02-06 ENCOUNTER — Ambulatory Visit: Payer: Medicaid Other | Admitting: Physician Assistant

## 2019-02-06 DIAGNOSIS — E1165 Type 2 diabetes mellitus with hyperglycemia: Secondary | ICD-10-CM

## 2019-02-06 DIAGNOSIS — E785 Hyperlipidemia, unspecified: Secondary | ICD-10-CM

## 2019-02-06 LAB — LIPID PANEL
Cholesterol: 145 mg/dL (ref 0–200)
HDL: 36 mg/dL — ABNORMAL LOW (ref >40)
LDL Cholesterol: 85 mg/dL (ref 0–99)
Total CHOL/HDL Ratio: 4 RATIO
Triglycerides: 122 mg/dL (ref <150)
VLDL: 24 mg/dL (ref 0–40)

## 2019-02-07 ENCOUNTER — Encounter (INDEPENDENT_AMBULATORY_CARE_PROVIDER_SITE_OTHER): Payer: Self-pay | Admitting: *Deleted

## 2019-02-07 LAB — HEMOGLOBIN A1C
Hgb A1c MFr Bld: 9.5 % — ABNORMAL HIGH (ref 4.8–5.6)
Mean Plasma Glucose: 226 mg/dL

## 2019-02-13 ENCOUNTER — Other Ambulatory Visit: Payer: Self-pay

## 2019-02-13 ENCOUNTER — Ambulatory Visit: Payer: Medicaid Other | Admitting: Physician Assistant

## 2019-02-13 ENCOUNTER — Encounter: Payer: Self-pay | Admitting: Physician Assistant

## 2019-02-13 VITALS — BP 130/82 | HR 90 | Temp 97.3°F | Wt 175.4 lb

## 2019-02-13 DIAGNOSIS — E1165 Type 2 diabetes mellitus with hyperglycemia: Secondary | ICD-10-CM

## 2019-02-13 DIAGNOSIS — F39 Unspecified mood [affective] disorder: Secondary | ICD-10-CM

## 2019-02-13 DIAGNOSIS — I10 Essential (primary) hypertension: Secondary | ICD-10-CM

## 2019-02-13 DIAGNOSIS — F17200 Nicotine dependence, unspecified, uncomplicated: Secondary | ICD-10-CM

## 2019-02-13 DIAGNOSIS — E785 Hyperlipidemia, unspecified: Secondary | ICD-10-CM

## 2019-02-13 DIAGNOSIS — K209 Esophagitis, unspecified without bleeding: Secondary | ICD-10-CM

## 2019-02-13 NOTE — Progress Notes (Signed)
BP 130/82   Pulse 90   Temp (!) 97.3 F (36.3 C)   Wt 175 lb 6.4 oz (79.6 kg)   SpO2 99%   BMI 26.67 kg/m    Subjective:    Patient ID: [name] [name], male    DOB: 13-Jun-1970, 48 y.o.   MRN: [id number]  HPI: [name] [name] is a 48 y.o. male presenting on 02/13/2019 for Diabetes   HPI  Pt had a negative covid 19 screening questionnaire     He has EGD scheduled for October.  He is being seen by GI for gastroparesis, Barrett's esophagus, esophagitis, diarrhea.   Pt is not been to [facility] lately.   He says he can't get an answer when he calls.  Pt is still smoking.  He says he isn't taking any etoh.    Pt says his metprolol was discontinued in the hospital  He says he is doing pretty good.    He has some bs readngs- 177, 181, 182, 261, 225.  He says he has had No lows  Pt says he has applied for [facility] charity care finanacial assistance but hasn't heard on it yet     Relevant past medical, surgical, family and social history reviewed and updated as indicated. Interim medical history since our last visit reviewed. Allergies and medications reviewed and updated.   Current Outpatient Medications:  .  acetaminophen (TYLENOL) 325 MG tablet, Take 2 tablets (650 mg total) by mouth every 6 (six) hours as needed for moderate pain., Disp: , Rfl:  .  aspirin 81 MG tablet, Take 81 mg by mouth at bedtime. , Disp: , Rfl:  .  atorvastatin (LIPITOR) 80 MG tablet, TAKE 1 Tablet BY MOUTH EVERY DAY, Disp: 90 tablet, Rfl: 1 .  busPIRone (BUSPAR) 5 MG tablet, Take 5 mg by mouth. 1 tab QAM and 2 tab qhs, Disp: , Rfl:  .  insulin aspart (NOVOLOG) 100 UNIT/ML injection, Before each meal 3 times a day, 140-199 - 2 units, 200-250 - 4 units, 251-299 - 6 units,  300-349 - 8 units,  350 or above 10 units. Dispense syringes and needles as needed, Ok to switch to PEN if approved. Substitute to any brand approved. DX DM2, Code E11.65, Disp: 1 vial, Rfl: 0 .  Insulin Glargine (LANTUS SOLOSTAR) 100  UNIT/ML Solostar Pen, Inject 40 Units into the skin at bedtime., Disp: 1 pen, Rfl: 3 .  isosorbide mononitrate (IMDUR) 30 MG 24 hr tablet, Take 1 tablet (30 mg total) by mouth daily., Disp: 90 tablet, Rfl: 3 .  metFORMIN (GLUCOPHAGE) 1000 MG tablet, TAKE 1 Tablet  BY MOUTH TWICE DAILY WITH A MEAL, Disp: 180 tablet, Rfl: 1 .  nitroGLYCERIN (NITROSTAT) 0.4 MG SL tablet, Place 1 tablet (0.4 mg total) under the tongue every 5 (five) minutes as needed for chest pain., Disp: 25 tablet, Rfl: 3 .  ondansetron (ZOFRAN ODT) 4 MG disintegrating tablet, Take 1 tablet (4 mg total) by mouth every 8 (eight) hours as needed for nausea., Disp: 10 tablet, Rfl: 0 .  pantoprazole (PROTONIX) 40 MG tablet, Take 1 tablet (40 mg total) by mouth 2 (two) times daily before a meal., Disp: 60 tablet, Rfl: 1 .  promethazine (PHENERGAN) 25 MG suppository, Place 1 suppository (25 mg total) rectally every 8 (eight) hours as needed for refractory nausea / vomiting., Disp: 12 each, Rfl: 0 .  PROZAC 40 MG capsule, Take 40 mg by mouth every morning., Disp: , Rfl:  .  ARIPiprazole (ABILIFY)  2 MG tablet, Take 2 mg by mouth daily., Disp: , Rfl:  .  buPROPion (WELLBUTRIN SR) 150 MG 12 hr tablet, Take 300 mg by mouth daily., Disp: , Rfl:  .  gabapentin (NEURONTIN) 300 MG capsule, Take 1 capsule (300 mg total) by mouth 2 (two) times daily. (Patient not taking: Reported on 02/13/2019), Disp: 60 capsule, Rfl: 2 .  metoCLOPramide (REGLAN) 5 MG tablet, Take 1 tablet (5 mg total) by mouth 3 (three) times daily before meals. (Patient not taking: Reported on 02/13/2019), Disp: 90 tablet, Rfl: 1    Review of Systems  Per HPI unless specifically indicated above     Objective:    BP 130/82   Pulse 90   Temp (!) 97.3 F (36.3 C)   Wt 175 lb 6.4 oz (79.6 kg)   SpO2 99%   BMI 26.67 kg/m   Wt Readings from Last 3 Encounters:  02/13/19 175 lb 6.4 oz (79.6 kg)  01/23/19 169 lb 4.8 oz (76.8 kg)  12/24/18 153 lb 3.5 oz (69.5 kg)    Physical  Exam Vitals signs reviewed.  Constitutional:      General: He is not in acute distress.    Appearance: Normal appearance. He is well-developed. He is not ill-appearing.  HENT:     Head: Normocephalic and atraumatic.  Neck:     Musculoskeletal: Neck supple.  Cardiovascular:     Rate and Rhythm: Normal rate and regular rhythm.  Pulmonary:     Effort: Pulmonary effort is normal.     Breath sounds: Normal breath sounds. No wheezing.  Abdominal:     General: Bowel sounds are normal.     Palpations: Abdomen is soft.     Tenderness: There is no abdominal tenderness.  Musculoskeletal:     Right lower leg: No edema.     Left lower leg: No edema.  Lymphadenopathy:     Cervical: No cervical adenopathy.  Skin:    General: Skin is warm and dry.  Neurological:     Mental Status: He is alert and oriented to person, place, and time.  Psychiatric:        Attention and Perception: Attention normal.        Speech: Speech normal.        Behavior: Behavior normal. Behavior is cooperative.     Results for orders placed or performed during the hospital encounter of 02/06/19  Hemoglobin A1c  Result Value Ref Range   Hgb A1c MFr Bld 9.5 (H) 4.8 - 5.6 %   Mean Plasma Glucose 226 mg/dL  Lipid panel  Result Value Ref Range   Cholesterol 145 0 - 200 mg/dL   Triglycerides 045122 <409<150 mg/dL   HDL 36 (L) >81>40 mg/dL   Total CHOL/HDL Ratio 4.0 RATIO   VLDL 24 0 - 40 mg/dL   LDL Cholesterol 85 0 - 99 mg/dL      Assessment & Plan:    Encounter Diagnoses  Name Primary?  Marland Kitchen. Uncontrolled type 2 diabetes mellitus with hyperglycemia (HCC) Yes  . Hyperlipidemia, unspecified hyperlipidemia type   . Essential hypertension   . Tobacco use disorder   . Mood disorder (HCC)   . Esophagitis      -reviewed labs with pt  -diabetic Foot exam updated today -pt was scheduled for inFluenza vaccination -will Increase lantus to 45 units daily.  He is to Continue ssi.  He is reminded to avoid skipping meals.  He  is reminded to call office for fbs < 70  or > 300 -Pt is encouraged to contact [facility] for MH issues -pt to continue with GI per their recomendations -pt to follow-up in  6 wk to review bs log.  He is to contact office sooner prn

## 2019-03-04 NOTE — Progress Notes (Signed)
Subjective:    Patient ID: [name] [name], male    DOB: 14-Dec-1970, 49 y.o.   MRN: [id number]  HPI [name] [name] is a 48 year old male with a past medical history significant for type 2 diabetes mellitus, diabetic gastroparesis, depression, sleep apnea not on CPAP, dyslipidemia, CAD, COPD, smoker since age 71, hypertension and Barrett's Esophagus.  Refer to office visit 01/23/2019.  Presents today for further follow-up prior to proceeding with an EGD 03/23/2019.  We previously discussed scheduling a colonoscopy if he continues to have loose stools. He continues to have some difficulty swallowing. He feels food get stuck in his throat/upper esophagus. He feels choked, can't breathe for a few seconds. He gags easily when eating solid food. He gags when he gets full and he vomits partially phlegm and digested food. No hematemesis.  He remains on pantoprazole 40 mg twice daily.  He continues to have loose watery stool 2 to 3 times daily. He hears rumbling in his intestines, he is afraid to pass gas as he sometimes leaks stool when expects to pass only gas. No blood per the rectum. He has passed 1 or 2 solid stools over the past 6 weeks. He often has diarrhea at night time. No recent antibiotics. He took [employer] 2 times for arthritis hip pain a few weeks ago. He takes Tylenol 565m 2-3 tabs in the am and 2 -3 tabs at bed time for hip pain. No alcohol.  He has significant depression with suicidal ideations in December 16, 2018 [facility]  He remains on Abilify, Prozac and Wellbutrin.  No suicidal ideation at this time.   Past Medical History:  Diagnosis Date  . Anxiety   . Arthritis   . CAD (coronary artery disease)    Moderate LAD disease 2016 - Dr. GEinar Gip . Chronic bronchitis ([facility]   . Chronic upper back pain   . COPD (chronic obstructive pulmonary disease) ([facility]   . Depression   . Diabetic peripheral neuropathy ([facility]   . GERD (gastroesophageal reflux  disease)   . History of gout   . Hyperlipemia   . Hypertension   . Migraine   . Noncompliance   . 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 Sleep apnea 2016  . Type 2 diabetes mellitus ([facility]    Past Surgical History:  Procedure Laterality Date  . ANKLE SURGERY Right 1982   "had extra bones in there; took them out"  . APPENDECTOMY  1975  . BIOPSY  12/26/2018   Procedure: BIOPSY;  Surgeon: RRogene Houston MD;  [facility];  Service: Endoscopy;;  duodenal biopsies  . CARDIAC CATHETERIZATION N/A 03/28/2015   Procedure: Left Heart Cath and Coronary Angiography;  Surgeon: JAdrian Prows MD;  [facility];  Service: Cardiovascular;  Laterality: N/A;  . CARDIAC CATHETERIZATION N/A 03/28/2015   Procedure: Intravascular Pressure Wire/FFR Study;  Surgeon: JAdrian Prows MD;  [facility];  Service: Cardiovascular;  Laterality: N/A;  . CARPAL TUNNEL RELEASE Left ~ 2008  . COLONOSCOPY WITH ESOPHAGOGASTRODUODENOSCOPY (EGD)    . ELBOW FRACTURE SURGERY Left ~ 2008  . ESOPHAGOGASTRODUODENOSCOPY N/A 12/26/2018   Procedure: ESOPHAGOGASTRODUODENOSCOPY (EGD);  Surgeon: RRogene Houston MD;  [facility];  Service: Endoscopy;  Laterality: N/A;  . FRACTURE SURGERY    . PILONIDAL CYST EXCISION N/A 03/24/2017   Procedure: EXCISION CHRONIC  PILONIDAL ABSCESS;  Surgeon: BCoralie Keens MD;  [facility];  Service: General;  Laterality: N/A;  . TENDON REPAIR Left ~ 2004   "main tendon in my ankle"    Current Outpatient Medications on File Prior to Visit  Medication Sig Dispense Refill  . acetaminophen (TYLENOL) 325 MG tablet Take 2 tablets (650 mg total) by mouth every 6 (six) hours as needed for moderate pain.    . ARIPiprazole (ABILIFY) 2 MG tablet Take 2 mg by mouth daily.    Marland Kitchen aspirin 81 MG tablet Take 81 mg by mouth at bedtime.     Marland Kitchen atorvastatin (LIPITOR) 80 MG tablet TAKE 1 Tablet BY MOUTH EVERY DAY 90 tablet 1  . buPROPion (WELLBUTRIN SR) 150 MG 12  hr tablet Take 300 mg by mouth daily.    . busPIRone (BUSPAR) 5 MG tablet Take 5 mg by mouth. 1 tab QAM and 2 tab qhs    . Insulin Glargine (LANTUS SOLOSTAR) 100 UNIT/ML Solostar Pen Inject 40 Units into the skin at bedtime. (Patient taking differently: Inject 45 Units into the skin at bedtime. ) 1 pen 3  . metFORMIN (GLUCOPHAGE) 1000 MG tablet TAKE 1 Tablet  BY MOUTH TWICE DAILY WITH A MEAL 180 tablet 1  . nitroGLYCERIN (NITROSTAT) 0.4 MG SL tablet Place 1 tablet (0.4 mg total) under the tongue every 5 (five) minutes as needed for chest pain. 25 tablet 3  . ondansetron (ZOFRAN ODT) 4 MG disintegrating tablet Take 1 tablet (4 mg total) by mouth every 8 (eight) hours as needed for nausea. 10 tablet 0  . promethazine (PHENERGAN) 25 MG suppository Place 1 suppository (25 mg total) rectally every 8 (eight) hours as needed for refractory nausea / vomiting. 12 each 0  . PROZAC 40 MG capsule Take 40 mg by mouth every morning.    . gabapentin (NEURONTIN) 300 MG capsule Take 1 capsule (300 mg total) by mouth 2 (two) times daily. (Patient not taking: Reported on 02/13/2019) 60 capsule 2  . insulin aspart (NOVOLOG) 100 UNIT/ML injection Before each meal 3 times a day, 140-199 - 2 units, 200-250 - 4 units, 251-299 - 6 units,  300-349 - 8 units,  350 or above 10 units. Dispense syringes and needles as needed, Ok to switch to PEN if approved. Substitute to any brand approved. DX DM2, Code E11.65 (Patient not taking: Reported on 03/06/2019) 1 vial 0  . isosorbide mononitrate (IMDUR) 30 MG 24 hr tablet Take 1 tablet (30 mg total) by mouth daily. 90 tablet 3  . metoCLOPramide (REGLAN) 5 MG tablet Take 1 tablet (5 mg total) by mouth 3 (three) times daily before meals. (Patient not taking: Reported on 02/13/2019) 90 tablet 1  . pantoprazole (PROTONIX) 40 MG tablet Take 1 tablet (40 mg total) by mouth 2 (two) times daily before a meal. 60 tablet 1   No current facility-administered medications on file prior to visit.     Allergies  Allergen Reactions  . Omeprazole Magnesium Swelling    Face swells, no breathing impairment  . Esomeprazole Swelling    Face swells, no breathing impairment   Review of Systems the HPI, all other systems reviewed and are negative     Objective:   Physical Exam Blood pressure (!) 144/89, pulse 97, temperature 98 F (36.7 C), temperature source Oral, height _0  (1.727 m), weight 172 lb 4.8 oz (78.2 kg). General: 48 year old male in no acute distress Eyes: Sclera nonicteric, conjunctiva pink Mouth: Absent dentition no ulcers or lesions Heart: Regular rate and rhythm, no murmurs Lungs: Scattered  inspiratory and expiratory wheezes throughout, no rhonchi or rails Abdomen: Soft, nontender, no masses or organomegaly, positive bowel sounds to all 4 quadrants Extremities: The left lateral ankle/lateral malleolus area moderately erythematous with edema, this area is warm to touch Neuro: Alert and oriented x4, patient is not anxious, he is answering questions appropriately    Assessment & Plan:   1. 48 y.o. male with severe ulcerative esophagitis per EGD, on Protonix 12m bid -Patient to contact his primary care physician today for further evaluation regarding inspiratory and expiratory wheezes on exam today -He will proceed with EGD with MAC as scheduled 03/23/2019 as long as his respiratory status improves, rule out Barrett's esophagus  -Further recommendation to be determined after EGD completed  2. Chronic loose stools -A colonoscopy will be scheduled at a later date -He will follow-up in the office in 2 months  3.  Left ankle pain with swelling -Patient to contact his primary care physician for further evaluation and treatment  4.  Arthritis, mostly affecting the hips. -Reduce Tylenol to 500 mg 2 tabs p.o. twice daily -Avoid NSAIDs including Goody powder.  5.  Depression, recently admitted to the hospital with suicidal ideation.  He is followed by psychiatry.  He  denies suicidal ideation at this time  6.  Smoker, continues to smoke 1/2 pack of cigarettes daily.  Inspiratory and expiratory wheezes on exam. -See plan in #1  7.  Poorly controlled diabetes mellitus type 2 on insulin and metformin  8.  History of CAD, no recent chest pain.  Cardiac catheterization 03/28/2015: EF 55 to 65%.  Mid LAD 50% stenosis, distal LAD 50% stenosed first diagonal branch 30% stenosed, distal CX 20% stenosed, mid RCA 10% stenosed

## 2019-03-06 ENCOUNTER — Ambulatory Visit (INDEPENDENT_AMBULATORY_CARE_PROVIDER_SITE_OTHER): Payer: Self-pay | Admitting: Nurse Practitioner

## 2019-03-06 ENCOUNTER — Encounter (INDEPENDENT_AMBULATORY_CARE_PROVIDER_SITE_OTHER): Payer: Self-pay | Admitting: Nurse Practitioner

## 2019-03-06 ENCOUNTER — Other Ambulatory Visit: Payer: Self-pay

## 2019-03-06 VITALS — BP 144/89 | HR 97 | Temp 98.0°F | Ht 68.0 in | Wt 172.3 lb

## 2019-03-06 DIAGNOSIS — R112 Nausea with vomiting, unspecified: Secondary | ICD-10-CM

## 2019-03-06 DIAGNOSIS — K219 Gastro-esophageal reflux disease without esophagitis: Secondary | ICD-10-CM

## 2019-03-06 DIAGNOSIS — R197 Diarrhea, unspecified: Secondary | ICD-10-CM

## 2019-03-06 NOTE — Patient Instructions (Signed)
1. Call your primary care doctor today as you will need an appointment as soon as possible to assess and treat your wheezing on exam and left ankle swelling.  2. Continue Protonix 40mg  one capsule twice daily.  3. If your wheezing resolves your EGD will proceed as scheduled on 03/23/2019. A colonoscopy will be scheduled at a later date.

## 2019-03-07 ENCOUNTER — Encounter: Payer: Self-pay | Admitting: Physician Assistant

## 2019-03-07 ENCOUNTER — Ambulatory Visit: Payer: Medicaid Other | Admitting: Physician Assistant

## 2019-03-07 ENCOUNTER — Other Ambulatory Visit: Payer: Self-pay

## 2019-03-07 DIAGNOSIS — Z20822 Contact with and (suspected) exposure to covid-19: Secondary | ICD-10-CM

## 2019-03-07 DIAGNOSIS — F17200 Nicotine dependence, unspecified, uncomplicated: Secondary | ICD-10-CM

## 2019-03-07 DIAGNOSIS — E1165 Type 2 diabetes mellitus with hyperglycemia: Secondary | ICD-10-CM

## 2019-03-07 DIAGNOSIS — R062 Wheezing: Secondary | ICD-10-CM

## 2019-03-07 DIAGNOSIS — R0602 Shortness of breath: Secondary | ICD-10-CM

## 2019-03-07 MED ORDER — ALBUTEROL SULFATE HFA 108 (90 BASE) MCG/ACT IN AERS: 2.0000 | INHALATION_SPRAY | Freq: Four times a day (QID) | RESPIRATORY_TRACT | 1 refills | Status: DC | PRN

## 2019-03-07 MED ORDER — INDOMETHACIN 50 MG PO CAPS: 50.0000 mg | ORAL_CAPSULE | Freq: Two times a day (BID) | ORAL | 1 refills | Status: DC

## 2019-03-07 NOTE — Patient Instructions (Signed)
How to Use a Soft Mist Inhaler  A soft mist inhaler is a handheld device for taking medicine that you breathe (inhale) into your lungs. The device changes a liquid medicine into a mist that can be inhaled. You may need a soft mist inhaler if you have a disease that causes your breathing tubes to narrow (bronchospasm). Using a soft mist inhaler helps prevent bronchospasm and keeps your airway open. A soft mist inhaler may be part of your long-term treatment for asthma or chronic obstructive pulmonary disease (COPD). The usual dosage is two inhalations every day. What are the risks?  If you do not use your inhaler correctly, medicine might not reach your lungs to help you breathe.  The medicine in the inhaler can cause side effects, such as: ? Chest tightness or difficulty breathing. ? Eye redness, eye pain, or vision changes. ? Difficulty passing urine. ? Dry mouth. ? Sore throat. ? Cough. ? Headache. ? Sinus congestion (sinusitis). Supplies needed:  Inhaler. The medicine that you will need comes in the inhaler. Each device contains the amount of medicine needed for 60 uses (30 daily doses of 2 inhalations). How to use a soft mist inhaler Using an inhaler for the first time 1. Remove the clear base of the inhaler by pressing the safety catch on the cap with your thumb and pulling off the clear base with your other hand. 2. On the label, write down the date that will be three months from now. This is the date you should throw away the inhaler. 3. Place the medicine cartridge that comes with the inhaler into the base of the inhaler. Press the cartridge on a flat surface to click it into place. Click the clear plastic base back into place over the cartridge. 4. Turn the clear base in the direction of the arrows on the label until you hear a click. 5. Open the cap on top of the inhaler. It should snap open all the way to show you the mouthpiece. 6. Prepare (prime) the inhaler for use. To do  this, point the inhaler toward the ground and press on the dose-release button below the mouthpiece. You should see the release of some mist. Be careful not to get any mist into your eyes. If you do not see mist, turn the base, open the cap, and prime the inhaler again until you see the mist. If you still do not see the mist, return the inhaler to your pharmacist for help. Taking an inhaled dose 1. Hold the inhaler upright. 2. Use your thumb and pointer finger to turn the base of the inhaler until you hear a click. This means the dose chamber is ready to deliver the medicine. 3. Open the cap until you hear a click. 4. Hold the inhaler in one hand with your pointer finger over the dose-release button. 5. Turn your head away from the inhaler and breathe out slowly. 6. Close your lips around the mouthpiece. 7. Point the inhaler toward the back of your mouth. 8. Press the dose-release button while taking a slow, deep breath through your mouth. 9. Hold your breath for 10 seconds, or as long as you can. 10. Turn your head away from the inhaler and breath out slowly through pursed lips. 11. Take a second inhalation, if your health care provider told you to. Do not take extra doses if you do not feel the mist as you inhale. Follow these instructions at home: General instructions  Check the indicator on   the inhaler to keep track of your doses. When the indicator is in the red zone, you have 7 days left. Get a refill at this time. The inhaler will lock when it is empty.  Throw away your inhaler if you have not used it in more than 30 days.  Do not use any products that contain nicotine or tobacco, such as cigarettes and e-cigarettes. If you need help quitting, ask your health care provider.  Tell your provider about: ? All your medical conditions. Use soft mist medicine with caution if you have glaucoma, an enlarged prostate, or kidney disease. ? If you are or may become pregnant. ? All medicines you  take. Some medicines can affect (interact with) the medicines in your inhaler.  Keep all follow-up visits as told by your health care provider. This is important. Using your inhaler  Use your soft mist inhaler only as told by your health care provider.  Do inhalations at about the same time each day.  If you have not used your inhaler for more than 3 days, release a mist dose toward the ground before using.  If you have not used your inhaler for more than 21 days, open the cap, turn the base, and prime your inhaler until you see mist. Repeat these steps three more times before using the inhaler. Caring for your inhaler  Store your soft mist inhaler at room temperature and keep it out of reach of children.  Clean the mouthpiece of your inhaler with a damp, clean, cloth once every week. Contact a health care provider if:  You have a very dry mouth or sore throat.  You have a fever.  You have stuffy nose (nasal congestion) or nasal discharge.  You have a cough that does not go away (is persistent).  You have a headache.  You have trouble passing urine.  You are not sure how to use your inhaler or your inhaler is not working properly. Get help right away if:  You have an allergic reaction. Some symptoms of an allergic reaction are an itchy rash, swelling of your face or tongue, or difficulty breathing.  You have severe and sudden eye pain or changes in your vision. Summary  A soft mist inhaler is a treatment for COPD or asthma.  You may have to take two inhalations each day on a long-term basis to prevent bronchospasm.  Follow instructions carefully in order to use your inhaler properly.  Common side effects include throat or sinus infection, dry mouth, cough, and headache.  Get help right away if you have an allergic reaction, sudden eye pain, or changes in vision. This information is not intended to replace advice given to you by your health care provider. Make sure you  discuss any questions you have with your health care provider. Document Released: 05/06/2016 Document Revised: 04/29/2017 Document Reviewed: 05/06/2016 Elsevier Patient Education  2020 [employer]  

## 2019-03-07 NOTE — Progress Notes (Signed)
There were no vitals taken for this visit.   Subjective:    Patient ID: [name] [name], male    DOB: Oct 19, 1970, 48 y.o.   MRN: [id number]  HPI: [name] is a 48 y.o. male presenting on 03/07/2019 for No chief complaint on file.   HPI  This is a telemedicine appointment due to coronavirus pandemic.  It is via Telephone as pt does not have a video enabled device  I connected with  [name] [name] on 03/07/19 by a video enabled telemedicine application and verified that I am speaking with the correct person using two identifiers.   I discussed the limitations of evaluation and management by telemedicine. The patient expressed understanding and agreed to proceed.  Pt is at home.  Provider is at office    Pt says He is wearing a mask when he goes out  He started wheezing 1 week ago.  He feels sob at times.  He denies fever.  Sometimes a cough.    Pt says he hasn't heard on [facility] [employer] [facility] financial assistance application.  He says he got a letter, but pt can't really explain what it says (he doesn't know if it says he was approved or not).    He says he has appt for flu shot here on Monday 03/12/19.    He is monitoring his blood sugars which are running 185-210.    He is using 45 u lantus.   No lows or episodes hypoglycemia.    Pt c/o foot pain- thinks it's gout.  He's had gout in the past.     Relevant past medical, surgical, family and social history reviewed and updated as indicated. Interim medical history since our last visit reviewed. Allergies and medications reviewed and updated.   Current Outpatient Medications:  .  acetaminophen (TYLENOL) 325 MG tablet, Take 2 tablets (650 mg total) by mouth every 6 (six) hours as needed for moderate pain., Disp: , Rfl:  .  aspirin 81 MG tablet, Take 81 mg by mouth at bedtime. , Disp: , Rfl:  .  atorvastatin (LIPITOR) 80 MG tablet, TAKE 1 Tablet BY MOUTH EVERY DAY, Disp: 90 tablet, Rfl: 1 .  busPIRone (BUSPAR) 5 MG tablet,  Take 5 mg by mouth. 1 tab QAM and 2 tab qhs, Disp: , Rfl:  .  insulin aspart (NOVOLOG) 100 UNIT/ML injection, Before each meal 3 times a day, 140-199 - 2 units, 200-250 - 4 units, 251-299 - 6 units,  300-349 - 8 units,  350 or above 10 units. Dispense syringes and needles as needed, Ok to switch to PEN if approved. Substitute to any brand approved. DX DM2, Code E11.65, Disp: 1 vial, Rfl: 0 .  Insulin Glargine (LANTUS SOLOSTAR) 100 UNIT/ML Solostar Pen, Inject 40 Units into the skin at bedtime. (Patient taking differently: Inject 45 Units into the skin at bedtime. ), Disp: 1 pen, Rfl: 3 .  isosorbide mononitrate (IMDUR) 30 MG 24 hr tablet, Take 1 tablet (30 mg total) by mouth daily., Disp: 90 tablet, Rfl: 3 .  metFORMIN (GLUCOPHAGE) 1000 MG tablet, TAKE 1 Tablet  BY MOUTH TWICE DAILY WITH A MEAL, Disp: 180 tablet, Rfl: 1 .  nitroGLYCERIN (NITROSTAT) 0.4 MG SL tablet, Place 1 tablet (0.4 mg total) under the tongue every 5 (five) minutes as needed for chest pain., Disp: 25 tablet, Rfl: 3 .  ondansetron (ZOFRAN ODT) 4 MG disintegrating tablet, Take 1 tablet (4 mg total) by mouth every 8 (eight) hours as  needed for nausea., Disp: 10 tablet, Rfl: 0 .  promethazine (PHENERGAN) 25 MG suppository, Place 1 suppository (25 mg total) rectally every 8 (eight) hours as needed for refractory nausea / vomiting., Disp: 12 each, Rfl: 0 .  PROZAC 40 MG capsule, Take 40 mg by mouth every morning., Disp: , Rfl:  .  ARIPiprazole (ABILIFY) 2 MG tablet, Take 2 mg by mouth daily., Disp: , Rfl:  .  buPROPion (WELLBUTRIN SR) 150 MG 12 hr tablet, Take 300 mg by mouth daily., Disp: , Rfl:  .  gabapentin (NEURONTIN) 300 MG capsule, Take 1 capsule (300 mg total) by mouth 2 (two) times daily. (Patient not taking: Reported on 02/13/2019), Disp: 60 capsule, Rfl: 2 .  metoCLOPramide (REGLAN) 5 MG tablet, Take 1 tablet (5 mg total) by mouth 3 (three) times daily before meals. (Patient not taking: Reported on 02/13/2019), Disp: 90 tablet,  Rfl: 1 .  pantoprazole (PROTONIX) 40 MG tablet, Take 1 tablet (40 mg total) by mouth 2 (two) times daily before a meal. (Patient not taking: Reported on 03/07/2019), Disp: 60 tablet, Rfl: 1    Review of Systems  Per HPI unless specifically indicated above     Objective:    There were no vitals taken for this visit.  Wt Readings from Last 3 Encounters:  03/06/19 172 lb 4.8 oz (78.2 kg)  02/13/19 175 lb 6.4 oz (79.6 kg)  01/23/19 169 lb 4.8 oz (76.8 kg)    Physical Exam Pulmonary:     Effort: No respiratory distress.  Neurological:     Mental Status: He is alert and oriented to person, place, and time.  Psychiatric:        Attention and Perception: Attention normal.        Speech: Speech normal.        Behavior: Behavior is cooperative.          Assessment & Plan:    Encounter Diagnoses  Name Primary?  . Wheezing Yes  . SOB (shortness of breath)   . Uncontrolled type 2 diabetes mellitus with hyperglycemia (HCC)   . Tobacco use disorder   . Suspected 2019 novel coronavirus infection      -will increase lantus to 50 units qhs.  He is to continue his ssi with novolog.  He is to monitor fbs and call office for fbs < 70 or > 300.    -will send for covid testing in light of new wheezing and sob.  Suspect it due to years of smoking.  Encouraged cessation.   rx inhaler.  Sent to both [facility] (so he can start on it today) and to [facility] (because he doesn't have $$ to get it filled locally).  Pt is counseled on how to use and inhaler and he is mailed instructions  -pt to follow up with bs log 3 wk (telemedicine appointment).  He is to contact office sooner prn worsening or new symptoms

## 2019-03-09 LAB — NOVEL CORONAVIRUS, NAA: SARS-CoV-2, NAA: NOT DETECTED

## 2019-03-15 NOTE — Patient Instructions (Signed)
[name] [name]  03/15/2019     @PREFPERIOPPHARMACY @   Your procedure is scheduled on  03/23/2019 .  Report to 03/25/2019 at  [phone]  A.M.  Call this number if you have problems the morning of surgery:  [phone]   Remember:  Follow the diet instructions given to you by Dr [phone] office.                      Take these medicines the morning of surgery with A SIP OF WATER  Abilify, bupropion, buspar, indomethicin, isosorbide, zofran or phenergan(if needed), prozac. Use your inhaler before you come. Take 1/2 of your usual night time insulin dose the night before your procedure.   DO NOT take any medication for diabetes the morning of your procedure.    Do not wear jewelry, make-up or nail polish.  Do not wear lotions, powders, or perfumes. Please wear deodorant and brush your teeth.  Do not shave 48 hours prior to surgery.  Men may shave face and neck.  Do not bring valuables to the hospital.  [facility] is not responsible for any belongings or valuables.  Contacts, dentures or bridgework may not be worn into surgery.  Leave your suitcase in the car.  After surgery it may be brought to your room.  For patients admitted to the hospital, discharge time will be determined by your treatment team.  Patients discharged the day of surgery will not be allowed to drive home.   Name and phone number of your driver:   family Special instructions:  None  Please read over the following fact sheets that you were given. Anesthesia Post-op Instructions and Care and Recovery After Surgery       Upper Endoscopy, Adult, Care After This sheet gives you information about how to care for yourself after your procedure. Your health care provider may also give you more specific instructions. If you have problems or questions, contact your health care provider. What can I expect after the procedure? After the procedure, it is common to have:  A sore throat.  Mild stomach pain or  discomfort.  Bloating.  Nausea. Follow these instructions at home:   Follow instructions from your health care provider about what to eat or drink after your procedure.  Return to your normal activities as told by your health care provider. Ask your health care provider what activities are safe for you.  Take over-the-counter and prescription medicines only as told by your health care provider.  Do not drive for 24 hours if you were given a sedative during your procedure.  Keep all follow-up visits as told by your health care provider. This is important. Contact a health care provider if you have:  A sore throat that lasts longer than one day.  Trouble swallowing. Get help right away if:  You vomit blood or your vomit looks like coffee grounds.  You have: ? A fever. ? Bloody, black, or tarry stools. ? A severe sore throat or you cannot swallow. ? Difficulty breathing. ? Severe pain in your chest or abdomen. Summary  After the procedure, it is common to have a sore throat, mild stomach discomfort, bloating, and nausea.  Do not drive for 24 hours if you were given a sedative during the procedure.  Follow instructions from your health care provider about what to eat or drink after your procedure.  Return to your normal activities as told by your health  care provider. This information is not intended to replace advice given to you by your health care provider. Make sure you discuss any questions you have with your health care provider. Document Released: 11/16/2011 Document Revised: 11/08/2017 Document Reviewed: 10/17/2017 Elsevier Patient Education  2020 [place] After These instructions provide you with information about caring for yourself after your procedure. Your health care provider may also give you more specific instructions. Your treatment has been planned according to current medical practices, but problems sometimes occur.  Call your health care provider if you have any problems or questions after your procedure. What can I expect after the procedure? After your procedure, you may:  Feel sleepy for several hours.  Feel clumsy and have poor balance for several hours.  Feel forgetful about what happened after the procedure.  Have poor judgment for several hours.  Feel nauseous or vomit.  Have a sore throat if you had a breathing tube during the procedure. Follow these instructions at home: For at least 24 hours after the procedure:      Have a responsible adult stay with you. It is important to have someone help care for you until you are awake and alert.  Rest as needed.  Do not: ? Participate in activities in which you could fall or become injured. ? Drive. ? Use heavy machinery. ? Drink alcohol. ? Take sleeping pills or medicines that cause drowsiness. ? Make important decisions or sign legal documents. ? Take care of children on your own. Eating and drinking  Follow the diet that is recommended by your health care provider.  If you vomit, drink water, juice, or soup when you can drink without vomiting.  Make sure you have little or no nausea before eating solid foods. General instructions  Take over-the-counter and prescription medicines only as told by your health care provider.  If you have sleep apnea, surgery and certain medicines can increase your risk for breathing problems. Follow instructions from your health care provider about wearing your sleep device: ? Anytime you are sleeping, including during daytime naps. ? While taking prescription pain medicines, sleeping medicines, or medicines that make you drowsy.  If you smoke, do not smoke without supervision.  Keep all follow-up visits as told by your health care provider. This is important. Contact a health care provider if:  You keep feeling nauseous or you keep vomiting.  You feel light-headed.  You develop a rash.   You have a fever. Get help right away if:  You have trouble breathing. Summary  For several hours after your procedure, you may feel sleepy and have poor judgment.  Have a responsible adult stay with you for at least 24 hours or until you are awake and alert. This information is not intended to replace advice given to you by your health care provider. Make sure you discuss any questions you have with your health care provider. Document Released: 09/07/2015 Document Revised: 08/15/2017 Document Reviewed: 09/07/2015 Elsevier Patient Education  2020 [employer].

## 2019-03-21 ENCOUNTER — Encounter (HOSPITAL_COMMUNITY): Payer: Self-pay

## 2019-03-21 ENCOUNTER — Other Ambulatory Visit (HOSPITAL_COMMUNITY)
Admission: RE | Admit: 2019-03-21 | Discharge: 2019-03-21 | Disposition: A | Discharge status: Home / Self Care | Payer: Medicaid Other | Source: Ambulatory Visit | Attending: Internal Medicine | Admitting: Internal Medicine

## 2019-03-21 ENCOUNTER — Other Ambulatory Visit: Payer: Self-pay

## 2019-03-21 ENCOUNTER — Encounter (HOSPITAL_COMMUNITY)
Admission: RE | Admit: 2019-03-21 | Discharge: 2019-03-21 | Disposition: A | Discharge status: Home / Self Care | Payer: Self-pay | Source: Ambulatory Visit | Attending: Internal Medicine | Admitting: Internal Medicine

## 2019-03-21 DIAGNOSIS — Z20828 Contact with and (suspected) exposure to other viral communicable diseases: Secondary | ICD-10-CM | POA: Insufficient documentation

## 2019-03-21 DIAGNOSIS — K2100 Gastro-esophageal reflux disease with esophagitis, without bleeding: Secondary | ICD-10-CM | POA: Insufficient documentation

## 2019-03-21 DIAGNOSIS — Z01812 Encounter for preprocedural laboratory examination: Secondary | ICD-10-CM | POA: Insufficient documentation

## 2019-03-21 LAB — BASIC METABOLIC PANEL
Anion gap: 10 (ref 5–15)
BUN: 19 mg/dL (ref 6–20)
CO2: 23 mmol/L (ref 22–32)
Calcium: 8.8 mg/dL — ABNORMAL LOW (ref 8.9–10.3)
Chloride: 101 mmol/L (ref 98–111)
Creatinine, Ser: 1.31 mg/dL — ABNORMAL HIGH (ref 0.61–1.24)
GFR calc Af Amer: >60 mL/min (ref >60)
GFR calc non Af Amer: >60 mL/min (ref >60)
Glucose, Bld: 397 mg/dL — ABNORMAL HIGH (ref 70–99)
Potassium: 4.8 mmol/L (ref 3.5–5.1)
Sodium: 134 mmol/L — ABNORMAL LOW (ref 135–145)

## 2019-03-21 LAB — SARS CORONAVIRUS 2 (TAT 6-24 HRS): SARS Coronavirus 2: NEGATIVE

## 2019-03-23 ENCOUNTER — Encounter (HOSPITAL_COMMUNITY): Payer: Self-pay

## 2019-03-23 ENCOUNTER — Encounter (HOSPITAL_COMMUNITY)
Admission: RE | Disposition: A | Discharge status: Home / Self Care | Payer: Self-pay | Source: Home / Self Care | Attending: Internal Medicine

## 2019-03-23 ENCOUNTER — Ambulatory Visit (HOSPITAL_COMMUNITY)
Admission: RE | Admit: 2019-03-23 | Discharge: 2019-03-23 | Disposition: A | Discharge status: Home / Self Care | Payer: Self-pay | Attending: Internal Medicine | Admitting: Internal Medicine

## 2019-03-23 ENCOUNTER — Ambulatory Visit (HOSPITAL_COMMUNITY): Payer: Self-pay | Admitting: Anesthesiology

## 2019-03-23 ENCOUNTER — Other Ambulatory Visit: Payer: Self-pay

## 2019-03-23 DIAGNOSIS — F329 Major depressive disorder, single episode, unspecified: Secondary | ICD-10-CM | POA: Insufficient documentation

## 2019-03-23 DIAGNOSIS — Z8711 Personal history of peptic ulcer disease: Secondary | ICD-10-CM | POA: Insufficient documentation

## 2019-03-23 DIAGNOSIS — K449 Diaphragmatic hernia without obstruction or gangrene: Secondary | ICD-10-CM

## 2019-03-23 DIAGNOSIS — Z1589 Genetic susceptibility to other disease: Secondary | ICD-10-CM

## 2019-03-23 DIAGNOSIS — K2100 Gastro-esophageal reflux disease with esophagitis, without bleeding: Secondary | ICD-10-CM | POA: Insufficient documentation

## 2019-03-23 DIAGNOSIS — J449 Chronic obstructive pulmonary disease, unspecified: Secondary | ICD-10-CM | POA: Insufficient documentation

## 2019-03-23 DIAGNOSIS — K766 Portal hypertension: Secondary | ICD-10-CM | POA: Insufficient documentation

## 2019-03-23 DIAGNOSIS — I2510 Atherosclerotic heart disease of native coronary artery without angina pectoris: Secondary | ICD-10-CM | POA: Insufficient documentation

## 2019-03-23 DIAGNOSIS — K2270 Barrett's esophagus without dysplasia: Secondary | ICD-10-CM | POA: Insufficient documentation

## 2019-03-23 DIAGNOSIS — F17210 Nicotine dependence, cigarettes, uncomplicated: Secondary | ICD-10-CM | POA: Insufficient documentation

## 2019-03-23 DIAGNOSIS — G709 Myoneural disorder, unspecified: Secondary | ICD-10-CM | POA: Insufficient documentation

## 2019-03-23 DIAGNOSIS — G4730 Sleep apnea, unspecified: Secondary | ICD-10-CM | POA: Insufficient documentation

## 2019-03-23 DIAGNOSIS — Z888 Allergy status to other drugs, medicaments and biological substances status: Secondary | ICD-10-CM | POA: Insufficient documentation

## 2019-03-23 DIAGNOSIS — F419 Anxiety disorder, unspecified: Secondary | ICD-10-CM | POA: Insufficient documentation

## 2019-03-23 DIAGNOSIS — E1142 Type 2 diabetes mellitus with diabetic polyneuropathy: Secondary | ICD-10-CM | POA: Insufficient documentation

## 2019-03-23 DIAGNOSIS — K3189 Other diseases of stomach and duodenum: Secondary | ICD-10-CM

## 2019-03-23 DIAGNOSIS — Z794 Long term (current) use of insulin: Secondary | ICD-10-CM | POA: Insufficient documentation

## 2019-03-23 DIAGNOSIS — K228 Other specified diseases of esophagus: Secondary | ICD-10-CM

## 2019-03-23 DIAGNOSIS — Z79899 Other long term (current) drug therapy: Secondary | ICD-10-CM | POA: Insufficient documentation

## 2019-03-23 DIAGNOSIS — K219 Gastro-esophageal reflux disease without esophagitis: Secondary | ICD-10-CM | POA: Insufficient documentation

## 2019-03-23 DIAGNOSIS — I10 Essential (primary) hypertension: Secondary | ICD-10-CM | POA: Insufficient documentation

## 2019-03-23 DIAGNOSIS — Z8249 Family history of ischemic heart disease and other diseases of the circulatory system: Secondary | ICD-10-CM | POA: Insufficient documentation

## 2019-03-23 DIAGNOSIS — Z7982 Long term (current) use of aspirin: Secondary | ICD-10-CM | POA: Insufficient documentation

## 2019-03-23 DIAGNOSIS — E785 Hyperlipidemia, unspecified: Secondary | ICD-10-CM | POA: Insufficient documentation

## 2019-03-23 DIAGNOSIS — M1990 Unspecified osteoarthritis, unspecified site: Secondary | ICD-10-CM | POA: Insufficient documentation

## 2019-03-23 HISTORY — PX: BIOPSY: SHX5522

## 2019-03-23 HISTORY — PX: ESOPHAGOGASTRODUODENOSCOPY (EGD) WITH PROPOFOL: SHX5813

## 2019-03-23 LAB — GLUCOSE, CAPILLARY: Glucose-Capillary: 168 mg/dL — ABNORMAL HIGH (ref 70–99)

## 2019-03-23 SURGERY — ESOPHAGOGASTRODUODENOSCOPY (EGD) WITH PROPOFOL
Anesthesia: General

## 2019-03-23 MED ORDER — MIDAZOLAM HCL 2 MG/2ML IJ SOLN
INTRAMUSCULAR | Status: AC
  Filled 2019-03-23: qty 2

## 2019-03-23 MED ORDER — PROPOFOL 500 MG/50ML IV EMUL
INTRAVENOUS | Status: DC | PRN
  Administered 2019-03-23: 150 ug/kg/min via INTRAVENOUS

## 2019-03-23 MED ORDER — PANTOPRAZOLE SODIUM 40 MG PO TBEC: 40.0000 mg | DELAYED_RELEASE_TABLET | Freq: Two times a day (BID) | ORAL | 5 refills | Status: DC

## 2019-03-23 MED ORDER — LACTATED RINGERS IV SOLN
INTRAVENOUS | Status: DC
  Administered 2019-03-23: 07:00:00 via INTRAVENOUS

## 2019-03-23 MED ORDER — HYDROCODONE-ACETAMINOPHEN 7.5-325 MG PO TABS: 1.0000 | ORAL_TABLET | Freq: Once | ORAL | Status: DC | PRN

## 2019-03-23 MED ORDER — LIDOCAINE HCL 1 % IJ SOLN
INTRAMUSCULAR | Status: DC | PRN
  Administered 2019-03-23: 50 mg via INTRADERMAL

## 2019-03-23 MED ORDER — CHLORHEXIDINE GLUCONATE CLOTH 2 % EX PADS: 6.0000 | MEDICATED_PAD | Freq: Once | CUTANEOUS | Status: DC

## 2019-03-23 MED ORDER — KETAMINE HCL 50 MG/5ML IJ SOSY
PREFILLED_SYRINGE | INTRAMUSCULAR | Status: AC
  Filled 2019-03-23: qty 5

## 2019-03-23 MED ORDER — PROPOFOL 10 MG/ML IV BOLUS
INTRAVENOUS | Status: AC
  Filled 2019-03-23: qty 40

## 2019-03-23 MED ORDER — HYDROMORPHONE HCL 1 MG/ML IJ SOLN: 0.2500 mg | INTRAMUSCULAR | Status: DC | PRN

## 2019-03-23 MED ORDER — PROPOFOL 10 MG/ML IV BOLUS
INTRAVENOUS | Status: DC | PRN
  Administered 2019-03-23 (×2): 20 mg via INTRAVENOUS

## 2019-03-23 MED ORDER — MIDAZOLAM HCL 5 MG/5ML IJ SOLN
INTRAMUSCULAR | Status: DC | PRN
  Administered 2019-03-23: 2 mg via INTRAVENOUS

## 2019-03-23 MED ORDER — MIDAZOLAM HCL 2 MG/2ML IJ SOLN: 0.5000 mg | Freq: Once | INTRAMUSCULAR | Status: DC | PRN

## 2019-03-23 MED ORDER — PROMETHAZINE HCL 25 MG/ML IJ SOLN: 6.2500 mg | INTRAMUSCULAR | Status: DC | PRN

## 2019-03-23 NOTE — H&P (Signed)
[name] [name] is an 48 y.o. male.   Chief Complaint: Patient is here for esophagogastroduodenoscopy. HPI: Patient is 48 year old Caucasian male with poorly controlled diabetes mellitus was evaluated about 12 weeks ago for intractable nausea and vomiting.  He was found to have ulcerative esophagitis and short segment Barrett's.  Biopsy was not taken because of ongoing inflammation.  It was felt that he had underlying gastroparesis due to diabetes.  Patient has been treated with pantoprazole twice daily and dietary measures.  He remains with intermittent nausea and vomiting.  He states this occurs at least once a week.  He says heartburn occurs with certain foods.  He denies dysphagia hematemesis or melena.  He remains with chronic loose stools felt to be due to diabetic neuropathy.  Colonoscopy was recommended to rule out other conditions but he wanted to wait.  He says his diabetes is not well controlled.  He had preop blood work and his blood glucose over 400.  This morning is fasting blood work is 168.  Past Medical History:  Diagnosis Date  . Anxiety   . Arthritis   . CAD (coronary artery disease)    Moderate LAD disease 2016 - Dr. Einar Gip  . Chronic bronchitis ([place])   . Chronic upper back pain   . COPD (chronic obstructive pulmonary disease) ([place])   . Depression   . Diabetic peripheral neuropathy ([place])   . GERD (gastroesophageal reflux disease)   . History of gout   . Hyperlipemia   . Hypertension   . Migraine   . Noncompliance   . 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 Sleep apnea 2016   "could not afford CPAP".  . Type 2 diabetes mellitus ([place])     Past Surgical History:  Procedure Laterality Date  . ANKLE SURGERY Right 1982   "had extra bones in there; took them out"  . APPENDECTOMY  1975  . BIOPSY  12/26/2018   Procedure: BIOPSY;  Surgeon: Rogene Houston, MD;  Location: [facility];  Service: Endoscopy;;  duodenal biopsies  . CARDIAC CATHETERIZATION N/A 03/28/2015    Procedure: Left Heart Cath and Coronary Angiography;  Surgeon: Adrian Prows, MD;  Location: [place] [facility];  Service: Cardiovascular;  Laterality: N/A;  . CARDIAC CATHETERIZATION N/A 03/28/2015   Procedure: Intravascular Pressure Wire/FFR Study;  Surgeon: Adrian Prows, MD;  Location: [place] [facility];  Service: Cardiovascular;  Laterality: N/A;  . CARPAL TUNNEL RELEASE Left ~ 2008  . COLONOSCOPY WITH ESOPHAGOGASTRODUODENOSCOPY (EGD)    . ELBOW FRACTURE SURGERY Left ~ 2008  . ESOPHAGOGASTRODUODENOSCOPY N/A 12/26/2018   Procedure: ESOPHAGOGASTRODUODENOSCOPY (EGD);  Surgeon: Rogene Houston, MD;  Location: [facility];  Service: Endoscopy;  Laterality: N/A;  . FRACTURE SURGERY     ankle and elbow  . PILONIDAL CYST EXCISION N/A 03/24/2017   Procedure: EXCISION CHRONIC  PILONIDAL ABSCESS;  Surgeon: Coralie Keens, MD;  Location: [facility];  Service: General;  Laterality: N/A;  . TENDON REPAIR Left ~ 2004   "main tendon in my ankle"    Family History  Problem Relation Age of Onset  . Congestive Heart Failure Sister   . Diabetes Mother   . Hypertension Mother   . Diabetes Father   . Hypertension Father    Social History:  reports that he has been smoking cigarettes. He has a 8.50 pack-year smoking history. He has never used smokeless tobacco. He reports that he does not drink alcohol or use drugs.  Allergies:  Allergies  Allergen Reactions  . Omeprazole Magnesium Swelling    Face swells, no breathing impairment  . Esomeprazole Swelling    Face swells, no breathing impairment    Medications Prior to Admission  Medication Sig Dispense Refill  . acetaminophen (TYLENOL) 325 MG tablet Take 2 tablets (650 mg total) by mouth every 6 (six) hours as needed for moderate pain.    Marland Kitchen. albuterol (VENTOLIN HFA) 108 (90 Base) MCG/ACT inhaler Inhale 2 puffs into the lungs every 6 (six) hours as needed for wheezing or shortness of breath. 8 g 1  . ARIPiprazole (ABILIFY) 2 MG tablet Take 2 mg by mouth  daily.    Marland Kitchen. aspirin 81 MG tablet Take 81 mg by mouth at bedtime.     Marland Kitchen. atorvastatin (LIPITOR) 80 MG tablet TAKE 1 Tablet BY MOUTH EVERY DAY (Patient taking differently: Take 80 mg by mouth at bedtime. ) 90 tablet 1  . buPROPion (WELLBUTRIN SR) 150 MG 12 hr tablet Take 300 mg by mouth daily.    . busPIRone (BUSPAR) 5 MG tablet Take 5-10 mg by mouth See admin instructions. Take 1 tablet (5mg ) by mouth in the morning & take 2 tablets (10 mg) by mouth at night.    . indomethacin (INDOCIN) 50 MG capsule Take 1 capsule (50 mg total) by mouth 2 (two) times daily with a meal. 30 capsule 1  . insulin aspart (NOVOLOG) 100 UNIT/ML injection Before each meal 3 times a day, 140-199 - 2 units, 200-250 - 4 units, 251-299 - 6 units,  300-349 - 8 units,  350 or above 10 units. Dispense syringes and needles as needed, Ok to switch to PEN if approved. Substitute to any brand approved. DX DM2, Code E11.65 (Patient taking differently: Inject 2-10 Units into the skin 3 (three) times daily with meals. Before each meal 3 times a day, 140-199 - 2 units, 200-250 - 4 units, 251-299 - 6 units,  300-349 - 8 units,  350 or above 10 units. Dispense syringes and needles as needed, Ok to switch to PEN if approved. Substitute to any brand approved. DX DM2, Code E11.65) 1 vial 0  . Insulin Glargine (LANTUS SOLOSTAR) 100 UNIT/ML Solostar Pen Inject 40 Units into the skin at bedtime. (Patient taking differently: Inject 50 Units into the skin at bedtime. ) 1 pen 3  . isosorbide mononitrate (IMDUR) 30 MG 24 hr tablet Take 1 tablet (30 mg total) by mouth daily. 90 tablet 3  . metFORMIN (GLUCOPHAGE) 1000 MG tablet TAKE 1 Tablet  BY MOUTH TWICE DAILY WITH A MEAL (Patient taking differently: Take 1,000 mg by mouth 2 (two) times daily with a meal. ) 180 tablet 1  . ondansetron (ZOFRAN ODT) 4 MG disintegrating tablet Take 1 tablet (4 mg total) by mouth every 8 (eight) hours as needed for nausea. 10 tablet 0  . promethazine (PHENERGAN) 25 MG  suppository Place 1 suppository (25 mg total) rectally every 8 (eight) hours as needed for refractory nausea / vomiting. 12 each 0  . PROZAC 40 MG capsule Take 40 mg by mouth every morning.    . gabapentin (NEURONTIN) 300 MG capsule Take 1 capsule (300 mg total) by mouth 2 (two) times daily. (Patient not taking: Reported on 02/13/2019) 60 capsule 2  . metoCLOPramide (REGLAN) 5 MG tablet Take 1 tablet (5 mg total) by mouth 3 (three) times daily before meals. (Patient not taking: Reported on 02/13/2019) 90 tablet 1  . nitroGLYCERIN (NITROSTAT) 0.4 MG SL tablet Place 1 tablet (0.4 mg  total) under the tongue every 5 (five) minutes as needed for chest pain. 25 tablet 3  . pantoprazole (PROTONIX) 40 MG tablet Take 1 tablet (40 mg total) by mouth 2 (two) times daily before a meal. (Patient not taking: Reported on 03/07/2019) 60 tablet 1    Results for orders placed or performed during the hospital encounter of 03/23/19 (from the past 48 hour(s))  Glucose, capillary     Status: Abnormal   Collection Time: 03/23/19  6:45 AM  Result Value Ref Range   Glucose-Capillary 168 (H) 70 - 99 mg/dL   No results found.  ROS  Blood pressure (!) 144/97, pulse 94, temperature 97.7 F (36.5 C), resp. rate 18, height 5\' 9"  (1.753 m), weight 80.3 kg, SpO2 100 %. Physical Exam  Constitutional: He appears well-developed and well-nourished.  HENT:  Mouth/Throat: Oropharynx is clear and moist.  Patient is edentulous.  Eyes: Conjunctivae are normal. No scleral icterus.  Neck: No thyromegaly present.  Cardiovascular: Normal rate, regular rhythm and normal heart sounds.  No murmur heard. Respiratory: Effort normal and breath sounds normal.  GI: Soft. He exhibits no distension and no mass. There is no abdominal tenderness.  Musculoskeletal:        General: No edema.  Lymphadenopathy:    He has no cervical adenopathy.  Neurological: He is alert.  Skin: Skin is warm and dry.  He has tattoos over both legs.      Assessment/Plan Chronic nausea and vomiting possibly due to diabetic gastroparesis. History of ulcerative reflux esophagitis and short segment Barrett's. Esophagogastroduodenoscopy with esophageal biopsy.  , MD 03/23/2019, 7:20 AM

## 2019-03-23 NOTE — Transfer of Care (Signed)
Immediate Anesthesia Transfer of Care Note  Patient: [name]  Procedure(s) Performed: ESOPHAGOGASTRODUODENOSCOPY (EGD) WITH PROPOFOL (N/A ) BIOPSY  Patient Location: PACU  Anesthesia Type:MAC and General  Level of Consciousness: awake  Airway & Oxygen Therapy: Patient Spontanous Breathing  Post-op Assessment: Report given to RN  Post vital signs: Reviewed  Last Vitals:  Vitals Value Taken Time  BP 107/65 03/23/19 0746  Temp    Pulse 87 03/23/19 0748  Resp 28 03/23/19 0748  SpO2 96 % 03/23/19 0748  Vitals shown include unvalidated device data.  Last Pain:  Vitals:   03/23/19 0728  TempSrc:   PainSc: 5          Complications: No apparent anesthesia complications

## 2019-03-23 NOTE — Anesthesia Postprocedure Evaluation (Signed)
Anesthesia Post Note  Patient: [name] [name]  Procedure(s) Performed: ESOPHAGOGASTRODUODENOSCOPY (EGD) WITH PROPOFOL (N/A ) BIOPSY  Patient location during evaluation: PACU Anesthesia Type: General Level of consciousness: awake and alert and oriented Pain management: pain level controlled Vital Signs Assessment: post-procedure vital signs reviewed and stable Respiratory status: spontaneous breathing Cardiovascular status: stable Postop Assessment: no apparent nausea or vomiting Anesthetic complications: no     Last Vitals:  Vitals:   03/23/19 0633 03/23/19 0746  BP:  107/65  Pulse:  88  Resp:  (!) 28  Temp: 36.5 C 36.6 C  SpO2:  96%    Last Pain:  Vitals:   03/23/19 0728  TempSrc:   PainSc: 5                  [place]ash [place]uce

## 2019-03-23 NOTE — Anesthesia Preprocedure Evaluation (Signed)
Anesthesia Evaluation  Patient identified by MRN, date of birth, ID band Patient awake  General Assessment Comment:Reports Father had h/o Awareness  He denies ever having issues   Reviewed: Allergy & Precautions, NPO status , Patient's Chart, lab work & pertinent test results  History of Anesthesia Complications (+) history of anesthetic complications  Airway Mallampati: II       Dental  (+) Edentulous Upper, Edentulous Lower   Pulmonary sleep apnea , COPD,  COPD inhaler, Current Smoker and Patient abstained from smoking.,           Cardiovascular Exercise Tolerance: Good hypertension, Pt. on medications + CAD  I  Known CAD -subcritical -no interventions  On imdur daily  Reports last SL NTG use over 3 months ago    Neuro/Psych  Headaches, Anxiety Depression  Neuromuscular disease negative psych ROS   GI/Hepatic Neg liver ROS, PUD, GERD  Medicated and Controlled,  Endo/Other  negative endocrine ROSdiabetes  Renal/GU Renal InsufficiencyRenal disease  negative genitourinary   Musculoskeletal  (+) Arthritis , Osteoarthritis,    Abdominal   Peds negative pediatric ROS (+)  Hematology negative hematology ROS (+)   Anesthesia Other Findings   Reproductive/Obstetrics negative OB ROS                             Anesthesia Physical Anesthesia Plan  ASA: III  Anesthesia Plan: General   Post-op Pain Management:    Induction: Intravenous  PONV Risk Score and Plan: 1 and Ondansetron, Propofol infusion, TIVA and Treatment may vary due to age or medical condition  Airway Management Planned: Nasal Cannula and Simple Face Mask  Additional Equipment:   Intra-op Plan:   Post-operative Plan:   Informed Consent: I have reviewed the patients History and Physical, chart, labs and discussed the procedure including the risks, benefits and alternatives for the proposed anesthesia with the patient  or authorized representative who has indicated his/her understanding and acceptance.     Dental advisory given  Plan Discussed with: CRNA  Anesthesia Plan Comments: (Plan Full PPE use  Plan GA with GETA as needed d/w pt -WTP with same after Q&A)        Anesthesia Quick Evaluation

## 2019-03-23 NOTE — Discharge Instructions (Addendum)
Gastrointestinal Bleeding °Gastrointestinal (GI) bleeding is bleeding somewhere along the path that food travels through the body (digestive tract). This path is anywhere between the mouth and the opening of the butt (anus). You may have blood in your poop (stool) or have black poop. If you throw up (vomit), there may be blood in it. °This condition can be mild, serious, or even life-threatening. If you have a lot of bleeding, you may need to stay in the hospital. °What are the causes? °This condition may be caused by: °· Irritation and swelling of the esophagus (esophagitis). The esophagus is part of the body that moves food from your mouth to your stomach. °· Swollen veins in the butt (hemorrhoids). °· Areas of painful tearing in the opening of the butt (anal fissures). These are often caused by passing hard poop. °· Pouches that form on the colon over time (diverticulosis). °· Irritation and swelling (diverticulitis) in areas where pouches have formed on the colon. °· Growths (polyps) or cancer. Colon cancer often starts out as growths that are not cancer. °· Irritation of the stomach lining (gastritis). °· Sores (ulcers) in the stomach. °What increases the risk? °You are more likely to develop this condition if you: °· Have a certain type of infection in your stomach (Helicobacter pylori infection). °· Take certain medicines. °· Smoke. °· Drink alcohol. °What are the signs or symptoms? °Common symptoms of this condition include: °· Throwing up (vomiting) material that has bright red blood in it. It may look like coffee grounds. °· Changes in your poop. The poop may: °? Have red blood in it. °? Be black, look like tar, and smell stronger than normal. °? Be red. °· Pain or cramping in the belly (abdomen). °How is this treated? °Treatment for this condition depends on the cause of the bleeding. For example: °· Sometimes, the bleeding can be stopped during a procedure that is done to find the problem (endoscopy or  colonoscopy). °· Medicines can be used to: °? Help control irritation, swelling, or infection. °? Reduce acid in your stomach. °· Certain problems can be treated with: °? Creams. °? Medicines that are put in the butt (suppositories). °? Warm baths. °· Surgery is sometimes needed. °· If you lose a lot of blood, you may need a blood transfusion. °If bleeding is mild, you may be allowed to go home. If there is a lot of bleeding, you will need to stay in the hospital. °Follow these instructions at home: ° °· Take over-the-counter and prescription medicines only as told by your doctor. °· Eat foods that have a lot of fiber in them. These foods include beans, whole grains, and fresh fruits and vegetables. You can also try eating 1-3 prunes each day. °· Drink enough fluid to keep your pee (urine) pale yellow. °· Keep all follow-up visits as told by your doctor. This is important. °Contact a doctor if: °· Your symptoms do not get better. °Get help right away if: °· Your bleeding does not stop. °· You feel dizzy or you pass out (faint). °· You feel weak. °· You have very bad cramps in your back or belly. °· You pass large clumps of blood (clots) in your poop. °· Your symptoms are getting worse. °· You have chest pain or fast heartbeats. °Summary °· GI bleeding is bleeding somewhere along the path that food travels through the body (digestive tract). °· This bleeding can be caused by many things. Treatment depends on the cause of the bleeding. °·   Take medicines only as told by your doctor. °· Keep all follow-up visits as told by your doctor. This is important. °This information is not intended to replace advice given to you by your health care provider. Make sure you discuss any questions you have with your health care provider. °Document Released: 02/24/2008 Document Revised: 12/28/2017 Document Reviewed: 12/28/2017 °Elsevier Patient Education © 2020 [employer] ° °

## 2019-03-23 NOTE — Op Note (Signed)
[facility] Patient Name: [name] [name] Procedure Date: 03/23/2019 7:27 AM MRN: [id number] Date of Birth: Feb 12, 1971 Attending MD: Lionel December , MD CSN: [id number] Age: 48 Admit Type: Outpatient Procedure:                Upper GI endoscopy Indications:              Follow-up of reflux esophagitis, Suspected                            Barrett's esophagus Providers:                Lionel December, MD, Loma Messing B. Patsy Lager, RN, Edythe Clarity, Technician Referring MD:             [name], PA Medicines:                Propofol per Anesthesia Complications:            No immediate complications. Estimated Blood Loss:     Estimated blood loss was minimal. Procedure:                Pre-Anesthesia Assessment:                           - Prior to the procedure, a History and Physical                            was performed, and patient medications and                            allergies were reviewed. The patient's tolerance of                            previous anesthesia was also reviewed. The risks                            and benefits of the procedure and the sedation                            options and risks were discussed with the patient.                            All questions were answered, and informed consent                            was obtained. Prior Anticoagulants: The patient has                            taken no previous anticoagulant or antiplatelet                            agents except for aspirin. ASA Grade Assessment:  III - A patient with severe systemic disease. After                            reviewing the risks and benefits, the patient was                            deemed in satisfactory condition to undergo the                            procedure.                           After obtaining informed consent, the endoscope was                            passed under direct vision. Throughout  the                            procedure, the patient's blood pressure, pulse, and                            oxygen saturations were monitored continuously. The                            GIF-H190 ([id number] scope was introduced through the                            mouth, and advanced to the second part of duodenum.                            The upper GI endoscopy was accomplished without                            difficulty. The patient tolerated the procedure                            well. Scope In: 7:33:31 AM Scope Out: 7:39:55 AM Total Procedure Duration: 0 hours 6 minutes 24 seconds  Findings:      The proximal esophagus and mid esophagus were normal.      There were esophageal mucosal changes consistent with short-segment       Barrett's esophagus present in the distal esophagus. The maximum       longitudinal extent of these mucosal changes was 2 cm in length. Mucosa       was biopsied with a cold forceps for histology. A total of 2 specimen       bottles were sent to pathology. The pathology specimen was placed into       Bottle Number 1.      The Z-line was irregular and was found 37 cm from the incisors.      A 2 cm hiatal hernia was present.      Mild portal hypertensive gastropathy was found in the gastric fundus and       in the gastric body.      The exam of the stomach was otherwise normal.  The duodenal bulb and second portion of the duodenum were normal. Impression:               - Normal proximal esophagus and mid esophagus.                           - Esophageal mucosal changes consistent with                            short-segment Barrett's esophagus. Biopsied.                           - Z-line irregular, 37 cm from the incisors.                           - 2 cm hiatal hernia.                           - Portal hypertensive gastropathy.                           - Normal duodenal bulb and second portion of the                             duodenum. Moderate Sedation:      Per Anesthesia Care Recommendation:           - Patient has a contact number available for                            emergencies. The signs and symptoms of potential                            delayed complications were discussed with the                            patient. Return to normal activities tomorrow.                            Written discharge instructions were provided to the                            patient.                           - Resume previous diet today.                           - Continue present medications.                           - No aspirin, ibuprofen, naproxen, or other                            non-steroidal anti-inflammatory drugs for 1 day.                           -  Perform an H. pylori serology today.                           - Await pathology results. Procedure Code(s):        --- Professional ---                           [id number], Esophagogastroduodenoscopy, flexible,                            transoral; with biopsy, single or multiple Diagnosis Code(s):        --- Professional ---                           K22.8, Other specified diseases of esophagus                           K44.9, Diaphragmatic hernia without obstruction or                            gangrene                           K76.6, Portal hypertension                           K31.89, Other diseases of stomach and duodenum                           K21.0, Gastro-esophageal reflux disease with                            esophagitis CPT copyright 2019 American Medical Association. All rights reserved. The codes documented in this report are preliminary and upon coder review may  be revised to meet current compliance requirements. Hildred Laser, MD Hildred Laser, MD 03/23/2019 7:55:01 AM This report has been signed electronically. Number of Addenda: 0

## 2019-03-26 ENCOUNTER — Ambulatory Visit: Payer: Medicaid Other | Admitting: Physician Assistant

## 2019-03-26 ENCOUNTER — Encounter: Payer: Self-pay | Admitting: Physician Assistant

## 2019-03-26 DIAGNOSIS — E1165 Type 2 diabetes mellitus with hyperglycemia: Secondary | ICD-10-CM

## 2019-03-26 DIAGNOSIS — D649 Anemia, unspecified: Secondary | ICD-10-CM

## 2019-03-26 DIAGNOSIS — F39 Unspecified mood [affective] disorder: Secondary | ICD-10-CM

## 2019-03-26 DIAGNOSIS — F17200 Nicotine dependence, unspecified, uncomplicated: Secondary | ICD-10-CM

## 2019-03-26 DIAGNOSIS — J449 Chronic obstructive pulmonary disease, unspecified: Secondary | ICD-10-CM

## 2019-03-26 DIAGNOSIS — K2270 Barrett's esophagus without dysplasia: Secondary | ICD-10-CM

## 2019-03-26 DIAGNOSIS — E785 Hyperlipidemia, unspecified: Secondary | ICD-10-CM

## 2019-03-26 DIAGNOSIS — I10 Essential (primary) hypertension: Secondary | ICD-10-CM

## 2019-03-26 LAB — H. PYLORI ANTIBODY, IGG: H Pylori IgG: 0.1 Index Value (ref 0.00–0.79)

## 2019-03-26 LAB — SURGICAL PATHOLOGY

## 2019-03-26 MED ORDER — DULERA 100-5 MCG/ACT IN AERO: 2.0000 | INHALATION_SPRAY | Freq: Two times a day (BID) | RESPIRATORY_TRACT | 1 refills | Status: DC

## 2019-03-26 NOTE — Progress Notes (Signed)
There were no vitals taken for this visit.   Subjective:    Patient ID: [name] PettyDanny L [name], male    DOB: 25-Apr-1971, 48 y.o.   MRN: [id number]  HPI: [name] [name] is a 48 y.o. male presenting on 03/26/2019 for No chief complaint on file.   HPI   This is a telemedicine appointment due to coronavirus pandemic.  It is via telephone as patient does not have a video enabled device.  I connected with  [name] [name] on 03/26/19 by a video enabled telemedicine application and verified that I am speaking with the correct person using two identifiers.   I discussed the limitations of evaluation and management by telemedicine. The patient expressed understanding and agreed to proceed.  Patient is at home.  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   Pt had EGD recently.  It showed Barrett's esophagus wihtout dysplasia.  He is continuing to follow with gastroenterology for this.  Patient continues to smoke.  He says he uses his albuterol inhaler twice every day.  He currently is not using a maintenance inhaler.  He says his breathing "comes and goes" .   He is not currently going anywhere for MH.  [facility]  He isn't going there because he can't video chat and he says that is what they are requesting in order to treat him.  He is feeling pretty good today.  He has no new complaints today.  He is checking his blood sugars.  He says they are running around the 200s.  He says sometimes they are around the 160s.  He is currently using lantus 50u.  He says he usually uses novolog only bid.  He says he isn't usually home at lunchhtime to give the third dose.  He denies any low blood sugar readings.  He is not working  He says he wears a mask when he goes out    Relevant past medical, surgical, family and social history reviewed and updated as indicated. Interim medical history since our last visit reviewed. Allergies and medications reviewed and  updated.   Current Outpatient Medications:  .  acetaminophen (TYLENOL) 325 MG tablet, Take 2 tablets (650 mg total) by mouth every 6 (six) hours as needed for moderate pain., Disp: , Rfl:  .  albuterol (VENTOLIN HFA) 108 (90 Base) MCG/ACT inhaler, Inhale 2 puffs into the lungs every 6 (six) hours as needed for wheezing or shortness of breath., Disp: 8 g, Rfl: 1 .  aspirin 81 MG tablet, Take 1 tablet (81 mg total) by mouth at bedtime., Disp: 30 tablet, Rfl:  .  atorvastatin (LIPITOR) 80 MG tablet, TAKE 1 Tablet BY MOUTH EVERY DAY (Patient taking differently: Take 80 mg by mouth at bedtime. ), Disp: 90 tablet, Rfl: 1 .  busPIRone (BUSPAR) 5 MG tablet, Take 5-10 mg by mouth See admin instructions. Take 1 tablet (5mg ) by mouth in the morning & take 2 tablets (10 mg) by mouth at night., Disp: , Rfl:  .  indomethacin (INDOCIN) 50 MG capsule, Take 1 capsule (50 mg total) by mouth 2 (two) times daily with a meal., Disp: 30 capsule, Rfl: 1 .  insulin aspart (NOVOLOG) 100 UNIT/ML injection, Before each meal 3 times a day, 140-199 - 2 units, 200-250 - 4 units, 251-299 - 6 units,  300-349 - 8 units,  350 or above 10 units. Dispense syringes and needles as needed, Ok to switch to PEN if approved. Substitute to any  brand approved. DX DM2, Code E11.65 (Patient taking differently: Inject 2-10 Units into the skin 3 (three) times daily with meals. Before each meal 3 times a day, 140-199 - 2 units, 200-250 - 4 units, 251-299 - 6 units,  300-349 - 8 units,  350 or above 10 units. Dispense syringes and needles as needed, Ok to switch to PEN if approved. Substitute to any brand approved. DX DM2, Code E11.65), Disp: 1 vial, Rfl: 0 .  Insulin Glargine (LANTUS SOLOSTAR) 100 UNIT/ML Solostar Pen, Inject 40 Units into the skin at bedtime. (Patient taking differently: Inject 50 Units into the skin at bedtime. ), Disp: 1 pen, Rfl: 3 .  isosorbide mononitrate (IMDUR) 30 MG 24 hr tablet, Take 1 tablet (30 mg total) by mouth daily.,  Disp: 90 tablet, Rfl: 3 .  metFORMIN (GLUCOPHAGE) 1000 MG tablet, TAKE 1 Tablet  BY MOUTH TWICE DAILY WITH A MEAL (Patient taking differently: Take 1,000 mg by mouth 2 (two) times daily with a meal. ), Disp: 180 tablet, Rfl: 1 .  pantoprazole (PROTONIX) 40 MG tablet, Take 1 tablet (40 mg total) by mouth 2 (two) times daily before a meal., Disp: 60 tablet, Rfl: 5 .  promethazine (PHENERGAN) 25 MG suppository, Place 1 suppository (25 mg total) rectally every 8 (eight) hours as needed for refractory nausea / vomiting., Disp: 12 each, Rfl: 0 .  PROZAC 40 MG capsule, Take 40 mg by mouth every morning., Disp: , Rfl:  .  ARIPiprazole (ABILIFY) 2 MG tablet, Take 2 mg by mouth daily., Disp: , Rfl:  .  buPROPion (WELLBUTRIN SR) 150 MG 12 hr tablet, Take 300 mg by mouth daily., Disp: , Rfl:  .  gabapentin (NEURONTIN) 300 MG capsule, Take 1 capsule (300 mg total) by mouth 2 (two) times daily. (Patient not taking: Reported on 02/13/2019), Disp: 60 capsule, Rfl: 2 .  metoCLOPramide (REGLAN) 5 MG tablet, Take 1 tablet (5 mg total) by mouth 3 (three) times daily before meals. (Patient not taking: Reported on 02/13/2019), Disp: 90 tablet, Rfl: 1 .  nitroGLYCERIN (NITROSTAT) 0.4 MG SL tablet, Place 1 tablet (0.4 mg total) under the tongue every 5 (five) minutes as needed for chest pain. (Patient not taking: Reported on 03/26/2019), Disp: 25 tablet, Rfl: 3 .  ondansetron (ZOFRAN ODT) 4 MG disintegrating tablet, Take 1 tablet (4 mg total) by mouth every 8 (eight) hours as needed for nausea. (Patient not taking: Reported on 03/26/2019), Disp: 10 tablet, Rfl: 0    Review of Systems  Per HPI unless specifically indicated above     Objective:    There were no vitals taken for this visit.  Wt Readings from Last 3 Encounters:  03/23/19 177 lb (80.3 kg)  03/21/19 177 lb (80.3 kg)  03/06/19 172 lb 4.8 oz (78.2 kg)    Physical Exam Pulmonary:     Effort: No respiratory distress.  Neurological:     Mental Status: He  is alert and oriented to person, place, and time.  Psychiatric:        Attention and Perception: Attention normal.        Speech: Speech normal.        Behavior: Behavior is cooperative.            Assessment & Plan:   Encounter Diagnoses  Name Primary?  Marland Kitchen Uncontrolled type 2 diabetes mellitus with hyperglycemia (HCC) Yes  . Essential hypertension   . Tobacco use disorder   . Chronic obstructive pulmonary disease, unspecified COPD type (HCC)   .  Mood disorder ([place])   . Barrett's esophagus without dysplasia   . Hyperlipidemia, unspecified hyperlipidemia type   . Anemia, unspecified type       1. COPD/tobacco use disorder  -Will add Dulera.  Prescription will be sent to [facility] medication assistance program.  He is to continue to use the albuterol inhaler as needed.  He is encouraged to stop smoking.  2. uncontrolled diabetes  Patient is to increase lantus to 55 units.  He is to continue to monitor his blood sugars.  He is to continue the NovoLog with a sliding scale.  He is to bring a blood sugar log with him to follow-up appointment in 1 month.  He is reminded to contact our office for fasting blood sugar less than 70 or over 300.  3. mental health issue  Patient previously has been seen at [facility] for management of mental health issues.  As stated above, patient has not been treated at that facility for some time as they have wanted to do telemedicine appointments the patient does not have a video enabled device.  Discussed with patient contacting congregational nursing to see if they will be able to recommend alternate method for patient to get treatment for his mental health issues.  Patient is in agreement with this.  Note sent to Mayra Reel requesting help with this situation.   4. Barrett's esophagus  Patient is to continue with gastroenterology for ongoing monitoring and treatment of this condition.   Patient is to continue other medications.  he will  follow up in office in 1 month with blood sugar log.  He is to contact the office sooner as needed.

## 2019-03-27 ENCOUNTER — Telehealth: Payer: Self-pay

## 2019-03-27 ENCOUNTER — Ambulatory Visit: Payer: Medicaid Other | Admitting: Physician Assistant

## 2019-03-27 NOTE — Telephone Encounter (Signed)
Called to attempt follow up with client and assist with options regarding Mental Health services. No Answer, left voicemail requesting a return call.    Mayra Reel RN  [facility]: Case Manager  [place] : RN,

## 2019-03-28 ENCOUNTER — Encounter (HOSPITAL_COMMUNITY): Payer: Self-pay | Admitting: Internal Medicine

## 2019-04-16 ENCOUNTER — Telehealth: Payer: Self-pay | Admitting: Cardiology

## 2019-04-16 NOTE — Telephone Encounter (Signed)
Virtual Visit Pre-Appointment Phone Call  "(Name), I am calling you today to discuss your upcoming appointment. We are currently trying to limit exposure to the virus that causes COVID-19 by seeing patients at home rather than in the office."  1. "What is the BEST phone number to call the day of the visit?" - include this in appointment notes  2. Do you have or have access to (through a family member/friend) a smartphone with video capability that we can use for your visit?" a. If yes - list this number in appt notes as cell (if different from BEST phone #) and list the appointment type as a VIDEO visit in appointment notes b. If no - list the appointment type as a PHONE visit in appointment notes  3. Confirm consent - "In the setting of the current Covid19 crisis, you are scheduled for a (phone or video) visit with your provider on (date) at (time).  Just as we do with many in-office visits, in order for you to participate in this visit, we must obtain consent.  If you'd like, I can send this to your mychart (if signed up) or email for you to review.  Otherwise, I can obtain your verbal consent now.  All virtual visits are billed to your insurance company just like a normal visit would be.  By agreeing to a virtual visit, we'd like you to understand that the technology does not allow for your provider to perform an examination, and thus may limit your provider's ability to fully assess your condition. If your provider identifies any concerns that need to be evaluated in person, we will make arrangements to do so.  Finally, though the technology is pretty good, we cannot assure that it will always work on either your or our end, and in the setting of a video visit, we may have to convert it to a phone-only visit.  In either situation, we cannot ensure that we have a secure connection.  Are you willing to proceed?" STAFF: Did the patient verbally acknowledge consent to telehealth visit? Document  YES/NO here: yes  4. Advise patient to be prepared - "Two hours prior to your appointment, go ahead and check your blood pressure, pulse, oxygen saturation, and your weight (if you have the equipment to check those) and write them all down. When your visit starts, your provider will ask you for this information. If you have an Apple Watch or Kardia device, please plan to have heart rate information ready on the day of your appointment. Please have a pen and paper handy nearby the day of the visit as well."  5. Give patient instructions for MyChart download to smartphone OR Doximity/Doxy.me as below if video visit (depending on what platform provider is using)  6. Inform patient they will receive a phone call 15 minutes prior to their appointment time (may be from unknown caller ID) so they should be prepared to answer    TELEPHONE CALL NOTE  [name] [name] has been deemed a candidate for a follow-up tele-health visit to limit community exposure during the Covid-19 pandemic. I spoke with the patient via phone to ensure availability of phone/video source, confirm preferred email & phone number, and discuss instructions and expectations.  I reminded [name] [name] to be prepared with any vital sign and/or heart rhythm information that could potentially be obtained via home monitoring, at the time of his visit. I reminded [name] [name] to expect a phone call prior to  his visit.  [name] [name] 04/16/2019 2:46 PM   INSTRUCTIONS FOR DOWNLOADING THE MYCHART APP TO SMARTPHONE  - The patient must first make sure to have activated MyChart and know their login information - If Apple, go to [employer] and type in MyChart in the search bar and download the app. If Android, ask patient to go to [employer] and type in [place] in the search bar and download the app. The app is free but as with any other app downloads, their phone may require them to verify saved payment information or  Apple/Android password.  - The patient will need to then log into the app with their MyChart username and password, and select [facility] as their healthcare provider to link the account. When it is time for your visit, go to the MyChart app, find appointments, and click Begin Video Visit. Be sure to Select Allow for your device to access the Microphone and Camera for your visit. You will then be connected, and your provider will be with you shortly.  **If they have any issues connecting, or need assistance please contact MyChart service desk [phone])**  **If using a computer, in order to ensure the best quality for their visit they will need to use either of the following Internet Browsers: [employer], or Google Chrome**  IF USING DOXIMITY or DOXY.ME - The patient will receive a link just prior to their visit by text.     FULL LENGTH CONSENT FOR TELE-HEALTH VISIT   I hereby voluntarily request, consent and authorize [facility] and its employed or contracted physicians, physician assistants, nurse practitioners or other licensed health care professionals (the Practitioner), to provide me with telemedicine health care services (the Services") as deemed necessary by the treating Practitioner. I acknowledge and consent to receive the Services by the Practitioner via telemedicine. I understand that the telemedicine visit will involve communicating with the Practitioner through live audiovisual communication technology and the disclosure of certain medical information by electronic transmission. I acknowledge that I have been given the opportunity to request an in-person assessment or other available alternative prior to the telemedicine visit and am voluntarily participating in the telemedicine visit.  I understand that I have the right to withhold or withdraw my consent to the use of telemedicine in the course of my care at any time, without affecting my right to future care  or treatment, and that the Practitioner or I may terminate the telemedicine visit at any time. I understand that I have the right to inspect all information obtained and/or recorded in the course of the telemedicine visit and may receive copies of available information for a reasonable fee.  I understand that some of the potential risks of receiving the Services via telemedicine include:   Delay or interruption in medical evaluation due to technological equipment failure or disruption;  Information transmitted may not be sufficient (e.g. poor resolution of images) to allow for appropriate medical decision making by the Practitioner; and/or   In rare instances, security protocols could fail, causing a breach of personal health information.  Furthermore, I acknowledge that it is my responsibility to provide information about my medical history, conditions and care that is complete and accurate to the best of my ability. I acknowledge that Practitioner's advice, recommendations, and/or decision may be based on factors not within their control, such as incomplete or inaccurate data provided by me or distortions of diagnostic images or specimens that may result from electronic transmissions. I  understand that the practice of medicine is not an exact science and that Practitioner makes no warranties or guarantees regarding treatment outcomes. I acknowledge that I will receive a copy of this consent concurrently upon execution via email to the email address I last provided but may also request a printed copy by calling the office of [facility].    I understand that my insurance will be billed for this visit.   I have read or had this consent read to me.  I understand the contents of this consent, which adequately explains the benefits and risks of the Services being provided via telemedicine.   I have been provided ample opportunity to ask questions regarding this consent and the Services and have had  my questions answered to my satisfaction.  I give my informed consent for the services to be provided through the use of telemedicine in my medical care  By participating in this telemedicine visit I agree to the above.

## 2019-04-18 ENCOUNTER — Encounter: Payer: Self-pay | Admitting: Cardiology

## 2019-04-18 ENCOUNTER — Telehealth (INDEPENDENT_AMBULATORY_CARE_PROVIDER_SITE_OTHER): Payer: Medicaid Other | Admitting: Cardiology

## 2019-04-18 VITALS — Ht 68.0 in | Wt 173.0 lb

## 2019-04-18 DIAGNOSIS — I2510 Atherosclerotic heart disease of native coronary artery without angina pectoris: Secondary | ICD-10-CM

## 2019-04-18 DIAGNOSIS — E782 Mixed hyperlipidemia: Secondary | ICD-10-CM

## 2019-04-18 DIAGNOSIS — Z720 Tobacco use: Secondary | ICD-10-CM

## 2019-04-18 DIAGNOSIS — E1165 Type 2 diabetes mellitus with hyperglycemia: Secondary | ICD-10-CM

## 2019-04-18 NOTE — Patient Instructions (Addendum)
Medication Instructions:   Your physician recommends that you continue on your current medications as directed. Please refer to the Current Medication list given to you today.  Labwork:  NONE  Testing/Procedures:  NONE  Follow-Up:  Your physician recommends that you schedule a follow-up appointment in: 10 months. You will receive a reminder letter in the mail in about 6-8 months reminding you to call and schedule your appointment. If you don't receive this letter, please contact our office.  Any Other Special Instructions Will Be Listed Below (If Applicable).  If you need a refill on your cardiac medications before your next appointment, please call your pharmacy. 

## 2019-04-18 NOTE — Progress Notes (Signed)
Virtual Visit via Telephone Note   This visit type was conducted due to national recommendations for restrictions regarding the COVID-19 Pandemic (e.g. social distancing) in an effort to limit this patient's exposure and mitigate transmission in our community.  Due to his co-morbid illnesses, this patient is at least at moderate risk for complications without adequate follow up.  This format is felt to be most appropriate for this patient at this time.  The patient did not have access to video technology/had technical difficulties with video requiring transitioning to audio format only (telephone).  All issues noted in this document were discussed and addressed.  No physical exam could be performed with this format.  Please refer to the patient's chart for his  consent to telehealth for [facility].   Date:  04/18/2019   ID:  [name] PettyDanny L Furlan, DOB 07/18/1970, MRN [id number]  Patient Location: Home Provider Location: Office  PCP:  Jacquelin HawkingMcElroy, Shannon, PA-C  Cardiologist:  Nona DellSamuel McDowell, MD  Electrophysiologist:  None   Evaluation Performed:  Follow-Up Visit  Chief Complaint: Follow-up coronary artery disease  History of Present Illness:    [name] [name] is a 48 y.o. male with history of coronary artery disease with moderate disease in LAD 2016.  Last visit was June 14, 2018.  At that visit he reported occasional anginal symptoms but no use of sublingual nitroglycerin on a frequent basis.  History of hypertension with blood pressure 108/72 at last visit.  Patient states his blood pressure device not working due to needing new batteries.  He was unable to take his blood pressure today.  Patient's last Lexiscan Myoview in August 2019 showed a medium defect of mild severity present in the mid anteroseptal and mid inferior septal, mid inferior, apical septal and apical inferior location.  This was determined to be likely due to soft tissue attenuation.  Ejection fraction was calculated  at 63%.  Patient last had a PCI in 2016 showing ejection fraction of 55 to 60% moderate coronary artery disease in proximal to mid segment of LAD showing a diffuse 50% stenosis.  Mid to distal LAD also had 50% stenosis.  Fractional flow reserve in this region was negative for hemodynamic significance.  There was minimal disease in the circumflex and RCA.  History of hyperlipidemia on Lipitor.  At last visit LDL had decreased.  History of smoking.  At last visit patient was continuing to smoke.  He was advised smoking cessation.  Patient states he was taking Chantix but it affected his behavior and he stopped.  He continues to smoke.  An ECG dated 12/25/2018 was personally reviewed today and demonstrated:  Sinus tachycardia with rate of 139 and right bundle branch block.  This EKG was performed secondary to gastrointestinal symptoms with vomiting [facility].  Patient has Barrett's disease and significant reflux symptoms recently diagnosed by gastroenterology.  On Reglan and Protonix.  The patient does not have symptoms concerning for COVID-19 infection (fever, chills, cough, or new shortness of breath).    Past Medical History:  Diagnosis Date  . Anxiety   . Arthritis   . CAD (coronary artery disease)    Moderate LAD disease 2016 - Dr. Jacinto HalimGanji  . Chronic bronchitis (HCC)   . Chronic upper back pain   . COPD (chronic obstructive pulmonary disease) (HCC)   . Depression   . Diabetic peripheral neuropathy (HCC)   . GERD (gastroesophageal reflux disease)   . History of gout   .  Hyperlipemia   . Hypertension   . Migraine   . Noncompliance   . 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 Sleep apnea 2016   "could not afford CPAP".  . Type 2 diabetes mellitus ([place])    Past Surgical History:  Procedure Laterality Date  . ANKLE SURGERY Right 1982   "had extra bones in there; took them out"  . APPENDECTOMY  1975  . BIOPSY  12/26/2018   Procedure: BIOPSY;  Surgeon:  Rogene Houston, MD;  [facility];  Service: Endoscopy;;  duodenal biopsies  . BIOPSY  03/23/2019   Procedure: BIOPSY;  Surgeon: Rogene Houston, MD;  [facility]: [facility];  Service: Endoscopy;;  esophagus  . CARDIAC CATHETERIZATION N/A 03/28/2015   Procedure: Left Heart Cath and Coronary Angiography;  Surgeon: Adrian Prows, MD;  Location: [facility];  Service: Cardiovascular;  Laterality: N/A;  . CARDIAC CATHETERIZATION N/A 03/28/2015   Procedure: Intravascular Pressure Wire/FFR Study;  Surgeon: Adrian Prows, MD;  Location: [facility];  Service: Cardiovascular;  Laterality: N/A;  . CARPAL TUNNEL RELEASE Left ~ 2008  . COLONOSCOPY WITH ESOPHAGOGASTRODUODENOSCOPY (EGD)    . ELBOW FRACTURE SURGERY Left ~ 2008  . ESOPHAGOGASTRODUODENOSCOPY N/A 12/26/2018   Procedure: ESOPHAGOGASTRODUODENOSCOPY (EGD);  Surgeon: Rogene Houston, MD;  [facility];  Service: Endoscopy;  Laterality: N/A;  . ESOPHAGOGASTRODUODENOSCOPY (EGD) WITH PROPOFOL N/A 03/23/2019   Procedure: ESOPHAGOGASTRODUODENOSCOPY (EGD) WITH PROPOFOL;  Surgeon: Rogene Houston, MD;  [facility];  Service: Endoscopy;  Laterality: N/A;  7:30  . FRACTURE SURGERY     ankle and elbow  . PILONIDAL CYST EXCISION N/A 03/24/2017   Procedure: EXCISION CHRONIC  PILONIDAL ABSCESS;  Surgeon: Coralie Keens, MD;  Location: [facility];  Service: General;  Laterality: N/A;  . TENDON REPAIR Left ~ 2004   "main tendon in my ankle"     Current Meds  Medication Sig  . acetaminophen (TYLENOL) 325 MG tablet Take 2 tablets (650 mg total) by mouth every 6 (six) hours as needed for moderate pain.  Marland Kitchen albuterol (VENTOLIN HFA) 108 (90 Base) MCG/ACT inhaler Inhale 2 puffs into the lungs every 6 (six) hours as needed for wheezing or shortness of breath.  . ARIPiprazole (ABILIFY) 2 MG tablet Take 2 mg by mouth daily.  Marland Kitchen aspirin 81 MG tablet Take 1 tablet (81 mg total) by mouth at bedtime.  Marland Kitchen atorvastatin (LIPITOR) 80  MG tablet TAKE 1 Tablet BY MOUTH EVERY DAY (Patient taking differently: Take 80 mg by mouth at bedtime. )  . buPROPion (WELLBUTRIN SR) 150 MG 12 hr tablet Take 300 mg by mouth daily.  . busPIRone (BUSPAR) 5 MG tablet Take 5-10 mg by mouth See admin instructions. Take 1 tablet (5mg ) by mouth in the morning & take 2 tablets (10 mg) by mouth at night.  . gabapentin (NEURONTIN) 300 MG capsule Take 1 capsule (300 mg total) by mouth 2 (two) times daily.  . indomethacin (INDOCIN) 50 MG capsule Take 1 capsule (50 mg total) by mouth 2 (two) times daily with a meal.  . insulin aspart (NOVOLOG) 100 UNIT/ML injection Before each meal 3 times a day, 140-199 - 2 units, 200-250 - 4 units, 251-299 - 6 units,  300-349 - 8 units,  350 or above 10 units. Dispense syringes and needles as needed, Ok to switch to PEN if approved. Substitute to any brand approved. DX DM2, Code E11.65 (Patient taking differently: Inject 2-10 Units into the  skin 3 (three) times daily with meals. Before each meal 3 times a day, 140-199 - 2 units, 200-250 - 4 units, 251-299 - 6 units,  300-349 - 8 units,  350 or above 10 units. Dispense syringes and needles as needed, Ok to switch to PEN if approved. Substitute to any brand approved. DX DM2, Code E11.65)  . Insulin Glargine (LANTUS SOLOSTAR) 100 UNIT/ML Solostar Pen Inject 40 Units into the skin at bedtime. (Patient taking differently: Inject 50 Units into the skin at bedtime. )  . isosorbide mononitrate (IMDUR) 30 MG 24 hr tablet Take 1 tablet (30 mg total) by mouth daily.  . metFORMIN (GLUCOPHAGE) 1000 MG tablet TAKE 1 Tablet  BY MOUTH TWICE DAILY WITH A MEAL (Patient taking differently: Take 1,000 mg by mouth 2 (two) times daily with a meal. )  . metoCLOPramide (REGLAN) 5 MG tablet Take 1 tablet (5 mg total) by mouth 3 (three) times daily before meals.  . mometasone-formoterol (DULERA) 100-5 MCG/ACT AERO Inhale 2 puffs into the lungs 2 (two) times daily.  . nitroGLYCERIN (NITROSTAT) 0.4 MG SL  tablet Place 1 tablet (0.4 mg total) under the tongue every 5 (five) minutes as needed for chest pain.  Marland Kitchen ondansetron (ZOFRAN ODT) 4 MG disintegrating tablet Take 1 tablet (4 mg total) by mouth every 8 (eight) hours as needed for nausea.  . pantoprazole (PROTONIX) 40 MG tablet Take 1 tablet (40 mg total) by mouth 2 (two) times daily before a meal.  . promethazine (PHENERGAN) 25 MG suppository Place 1 suppository (25 mg total) rectally every 8 (eight) hours as needed for refractory nausea / vomiting.  Marland Kitchen PROZAC 40 MG capsule Take 40 mg by mouth every morning.     Allergies:   Omeprazole magnesium and Esomeprazole   Social History   Tobacco Use  . Smoking status: Current Every Day Smoker    Packs/day: 0.25    Years: 34.00    Pack years: 8.50    Types: Cigarettes  . Smokeless tobacco: Never Used  . Tobacco comment: 1/2 pack a day  Substance Use Topics  . Alcohol use: Never    Alcohol/week: 0.0 standard drinks    Frequency: Never  . Drug use: Never     Family Hx: The patient's family history includes Congestive Heart Failure in his sister; Diabetes in his father and mother; Hypertension in his father and mother.  ROS:   Please see the history of present illness.    Patient complains of acid reflux symptoms. All other systems reviewed and are negative.   Prior CV studies:   The following studies were reviewed today: Cardiac catheterization March 28, 2015  1. Normal LV systolic function, EF 55-60%. 2. Moderate coronary artery disease, proximal to midsegment of the LAD showing a diffuse 50% stenosis, there is rapid tapering of the LAD from the proximal segment to the midsegment and followed by a hazy 50% stenosis. Mid to distal LAD also had a 50% stenosis. FFR to this region was negative for hemodynamic significance. There is minimal disease in the circumflex and RCA. Codominant system.  Labs/Other Tests and Data Reviewed:    EKG:  An ECG dated 12/25/2018 was personally reviewed  today and demonstrated:  Sinus tachycardia with rate of 139 and right bundle branch block  Recent Labs: 12/20/2018: Magnesium 1.4 01/23/2019: ALT 11; Hemoglobin 13.2; Platelets 306 03/21/2019: BUN 19; Creatinine, Ser 1.31; Potassium 4.8; Sodium 134   Recent Lipid Panel Lab Results  Component Value Date/Time   CHOL 145  02/06/2019 11:42 AM   TRIG 122 02/06/2019 11:42 AM   HDL 36 (L) 02/06/2019 11:42 AM   CHOLHDL 4.0 02/06/2019 11:42 AM   LDLCALC 85 02/06/2019 11:42 AM    Wt Readings from Last 3 Encounters:  04/18/19 173 lb (78.5 kg)  03/23/19 177 lb (80.3 kg)  03/21/19 177 lb (80.3 kg)     Objective:    Vital Signs:  Ht 5\' 8"  (1.727 m)   Wt 173 lb (78.5 kg)   BMI 26.30 kg/m    Patient speech pattern is normal and he is responding appropriately to questions.  No evidence of shortness of breath, cough or wheezing noted.  ASSESSMENT & PLAN:    1.  CAD with angina.  He has a history of moderate LAD disease with negative FFR assessment in 2016.  Low risk follow-up Myoview done in August 2019.   Patient denies any current anginal symptoms.  Continue current therapy.  2.  Mixed hyperlipidemia, on high-dose Lipitor.  LDL has come down from 188-37.  Continue Lipitor at current dosage.  3.  Uncontrolled type 2 diabetes mellitus, continues to follow with PCP.  Patient states blood sugars have improved per PCP.  Patient states he has a follow-up with PCP in a few weeks.  4.  Tobacco abuse.  We have discussed smoking cessation.  Highly advised patient to cease smoking.  Discussed contribution to heart disease, hypertension, gastroesophageal reflux symptoms and Barrett's disease.  Also discussed contribution to progression of type 2 diabetes.  Patient verbalizes understanding.   COVID-19 Education: The signs and symptoms of COVID-19 were discussed with the patient and how to seek care for testing (follow up with PCP or arrange E-visit). The importance of social distancing was discussed  today.  Time:   Today, I have spent 15 minutes with the patient with telehealth technology discussing the above problems.     Medication Adjustments/Labs and Tests Ordered: Current medicines are reviewed at length with the patient today.  Concerns regarding medicines are outlined above.   Tests Ordered: No orders of the defined types were placed in this encounter.   Medication Changes: No orders of the defined types were placed in this encounter.   Follow Up:  Either In Person or Virtual in 10 month(s)  Signed, September 2019, NP  04/18/2019 3:40 PM    [facility]

## 2019-04-24 ENCOUNTER — Telehealth: Payer: Self-pay | Admitting: Student

## 2019-04-24 ENCOUNTER — Other Ambulatory Visit (HOSPITAL_COMMUNITY)
Admission: RE | Admit: 2019-04-24 | Discharge: 2019-04-24 | Disposition: A | Discharge status: Home / Self Care | Payer: Medicaid Other | Source: Ambulatory Visit | Attending: Physician Assistant | Admitting: Physician Assistant

## 2019-04-24 DIAGNOSIS — I10 Essential (primary) hypertension: Secondary | ICD-10-CM | POA: Insufficient documentation

## 2019-04-24 DIAGNOSIS — E785 Hyperlipidemia, unspecified: Secondary | ICD-10-CM | POA: Insufficient documentation

## 2019-04-24 DIAGNOSIS — E1165 Type 2 diabetes mellitus with hyperglycemia: Secondary | ICD-10-CM | POA: Insufficient documentation

## 2019-04-24 DIAGNOSIS — D649 Anemia, unspecified: Secondary | ICD-10-CM | POA: Insufficient documentation

## 2019-04-24 LAB — COMPREHENSIVE METABOLIC PANEL WITH GFR
ALT: 13 U/L (ref 0–44)
AST: 14 U/L — ABNORMAL LOW (ref 15–41)
Albumin: 3.7 g/dL (ref 3.5–5.0)
Alkaline Phosphatase: 134 U/L — ABNORMAL HIGH (ref 38–126)
Anion gap: 12 (ref 5–15)
BUN: 27 mg/dL — ABNORMAL HIGH (ref 6–20)
CO2: 23 mmol/L (ref 22–32)
Calcium: 8.5 mg/dL — ABNORMAL LOW (ref 8.9–10.3)
Chloride: 90 mmol/L — ABNORMAL LOW (ref 98–111)
Creatinine, Ser: 1.51 mg/dL — ABNORMAL HIGH (ref 0.61–1.24)
GFR calc Af Amer: >60 mL/min
GFR calc non Af Amer: 53 mL/min — ABNORMAL LOW
Glucose, Bld: 673 mg/dL (ref 70–99)
Potassium: 5.9 mmol/L — ABNORMAL HIGH (ref 3.5–5.1)
Sodium: 125 mmol/L — ABNORMAL LOW (ref 135–145)
Total Bilirubin: 0.7 mg/dL (ref 0.3–1.2)
Total Protein: 7.2 g/dL (ref 6.5–8.1)

## 2019-04-24 LAB — HEMOGLOBIN A1C
Hgb A1c MFr Bld: 12.7 % — ABNORMAL HIGH (ref 4.8–5.6)
Mean Plasma Glucose: 317.79 mg/dL

## 2019-04-24 LAB — HEMOGLOBIN AND HEMATOCRIT, BLOOD
HCT: 46.8 % (ref 39.0–52.0)
Hemoglobin: 15.5 g/dL (ref 13.0–17.0)

## 2019-04-24 LAB — LIPID PANEL
Cholesterol: 222 mg/dL — ABNORMAL HIGH (ref 0–200)
HDL: 39 mg/dL — ABNORMAL LOW (ref >40)
LDL Cholesterol: UNDETERMINED mg/dL (ref 0–99)
Total CHOL/HDL Ratio: 5.7 RATIO
Triglycerides: 489 mg/dL — ABNORMAL HIGH (ref <150)
VLDL: UNDETERMINED mg/dL (ref 0–40)

## 2019-04-24 NOTE — Telephone Encounter (Signed)
[facility] - Lab called the office regarding pt's critical lab from today, 04-24-19 at 1315 for glucose of 673.   LPN notifed PA and PA advises for pt to seek ER treatment ASAP.  LPN called and notified of above. Pt verbalized understanding and states he will go to the ER.

## 2019-04-25 LAB — MICROALBUMIN, URINE: Microalb, Ur: 121.6 ug/mL — ABNORMAL HIGH

## 2019-04-25 LAB — LDL CHOLESTEROL, DIRECT: Direct LDL: 126.6 mg/dL — ABNORMAL HIGH (ref 0–99)

## 2019-04-30 ENCOUNTER — Ambulatory Visit: Payer: Medicaid Other | Admitting: Physician Assistant

## 2019-05-08 NOTE — Progress Notes (Deleted)
   Subjective:    Patient ID: [name] [name], male    DOB: [birth year], 48 y.o.   MRN: [id number]  HPI [name] [name] is a 48 year old male with a past medical history significant fortype 2 diabetes mellitus, diabetic gastroparesis, depression, sleep apnea not on CPAP, dyslipidemia, CAD, COPD, smoker since age 19,hypertension and Barrett's Esophagus  EGD 03/23/2019 by Dr. Laural Golden:  - Normal proximal esophagus and mid esophagus. - Esophageal mucosal changes consistent with short-segment Barrett's esophagus. Biopsied. - Z-line irregular, 37 cm from the incisors. - 2 cm hiatal hernia. - Portal hypertensive gastropathy. - Normal duodenal bulb and second portion of the duodenum  A. ESOPHAGUS, BIOPSY:  - Goblet cell metaplasia, consistent with Barrett's esophagus.  - There is no evidence of dysplasia or malignancy  Review of Systems     Objective:   Physical Exam        Assessment & Plan:

## 2019-05-09 ENCOUNTER — Ambulatory Visit (INDEPENDENT_AMBULATORY_CARE_PROVIDER_SITE_OTHER): Payer: Medicaid Other | Admitting: Nurse Practitioner

## 2019-05-21 ENCOUNTER — Other Ambulatory Visit: Payer: Self-pay | Admitting: Physician Assistant

## 2019-06-20 IMAGING — CR DG CHEST 2V
2 series · 2 of 2 positions shown · non-contrast
Comparison: Chest radiograph 11/23/2015

CLINICAL DATA: Body aches and emesis

EXAM:
CHEST  2 VIEW

[chest pa]
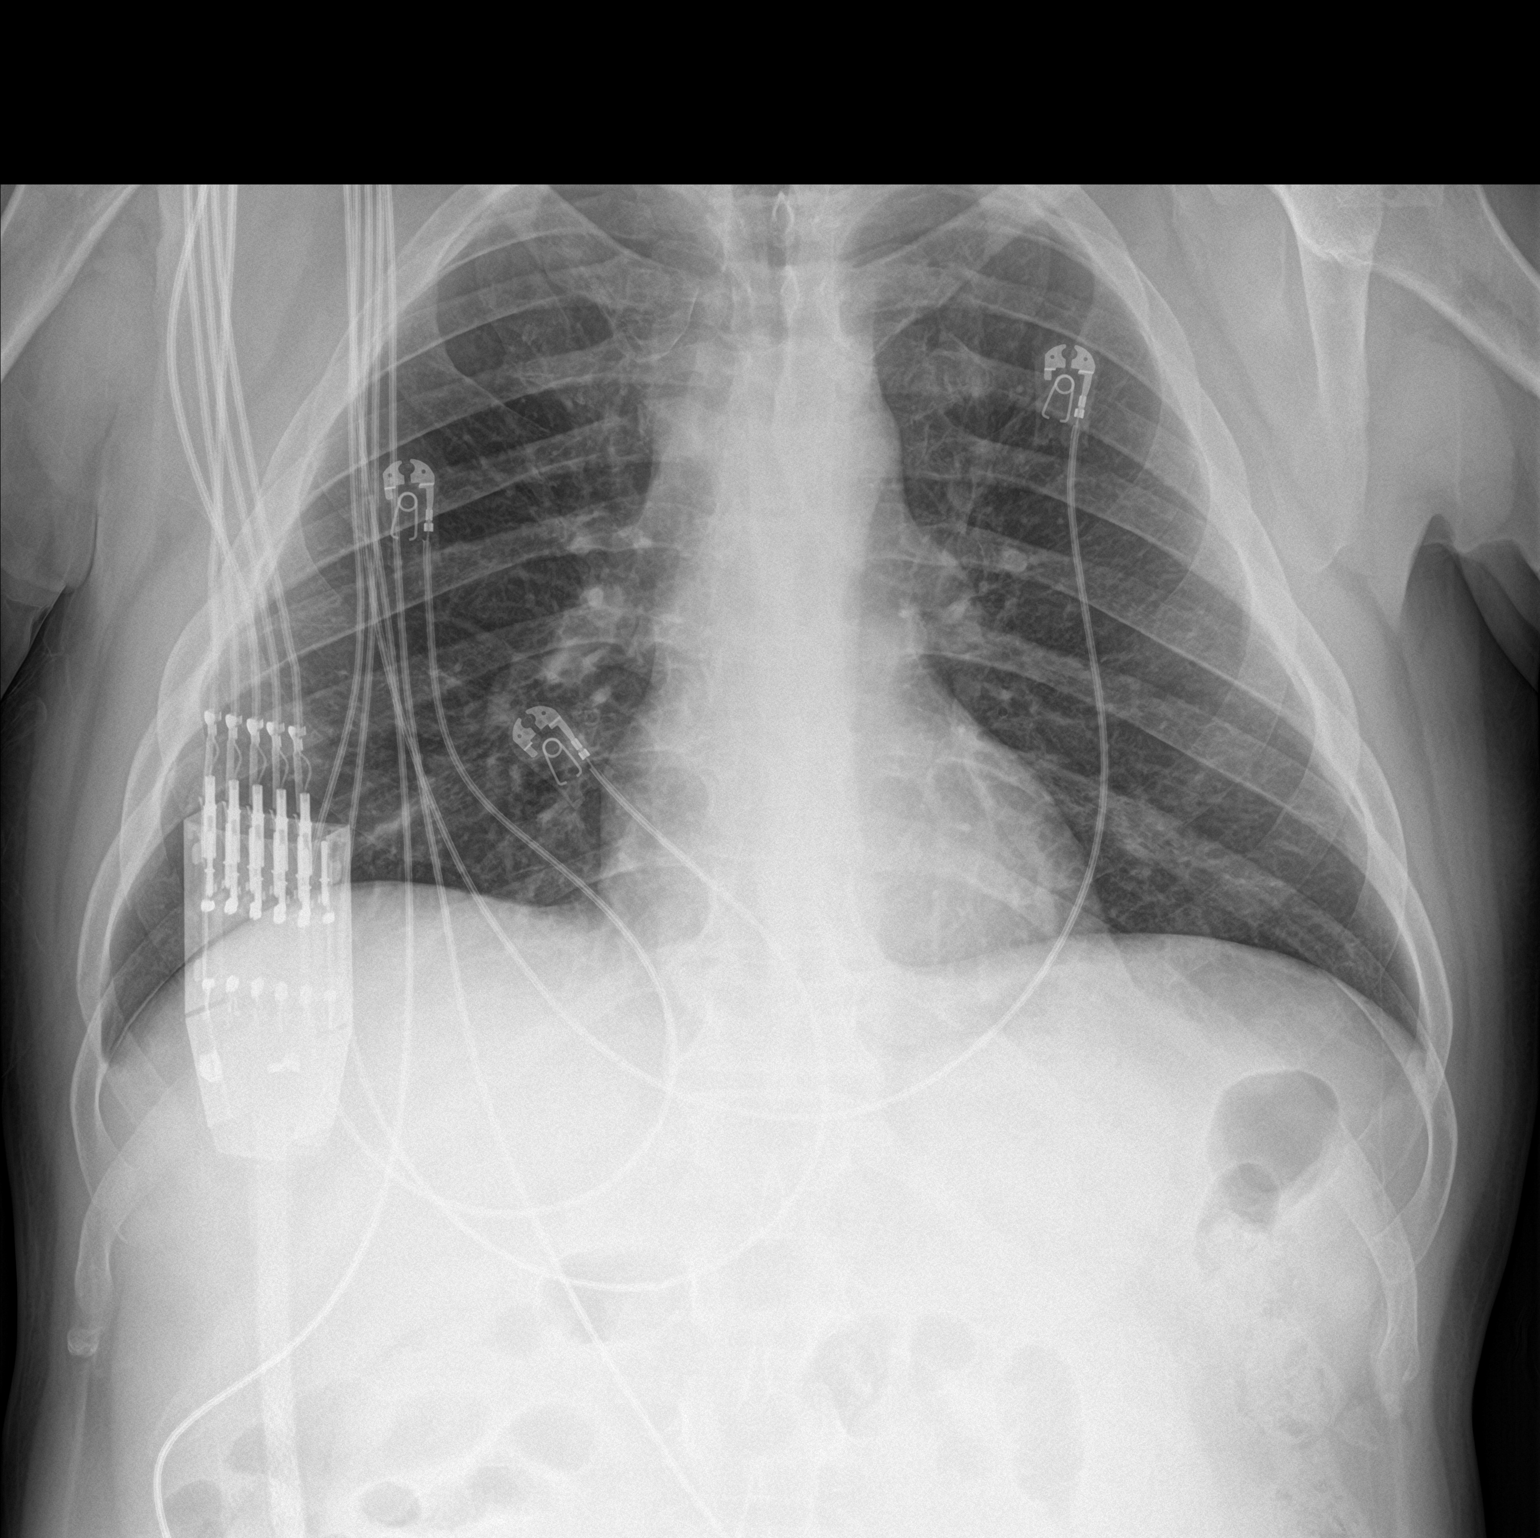

[chest lat]
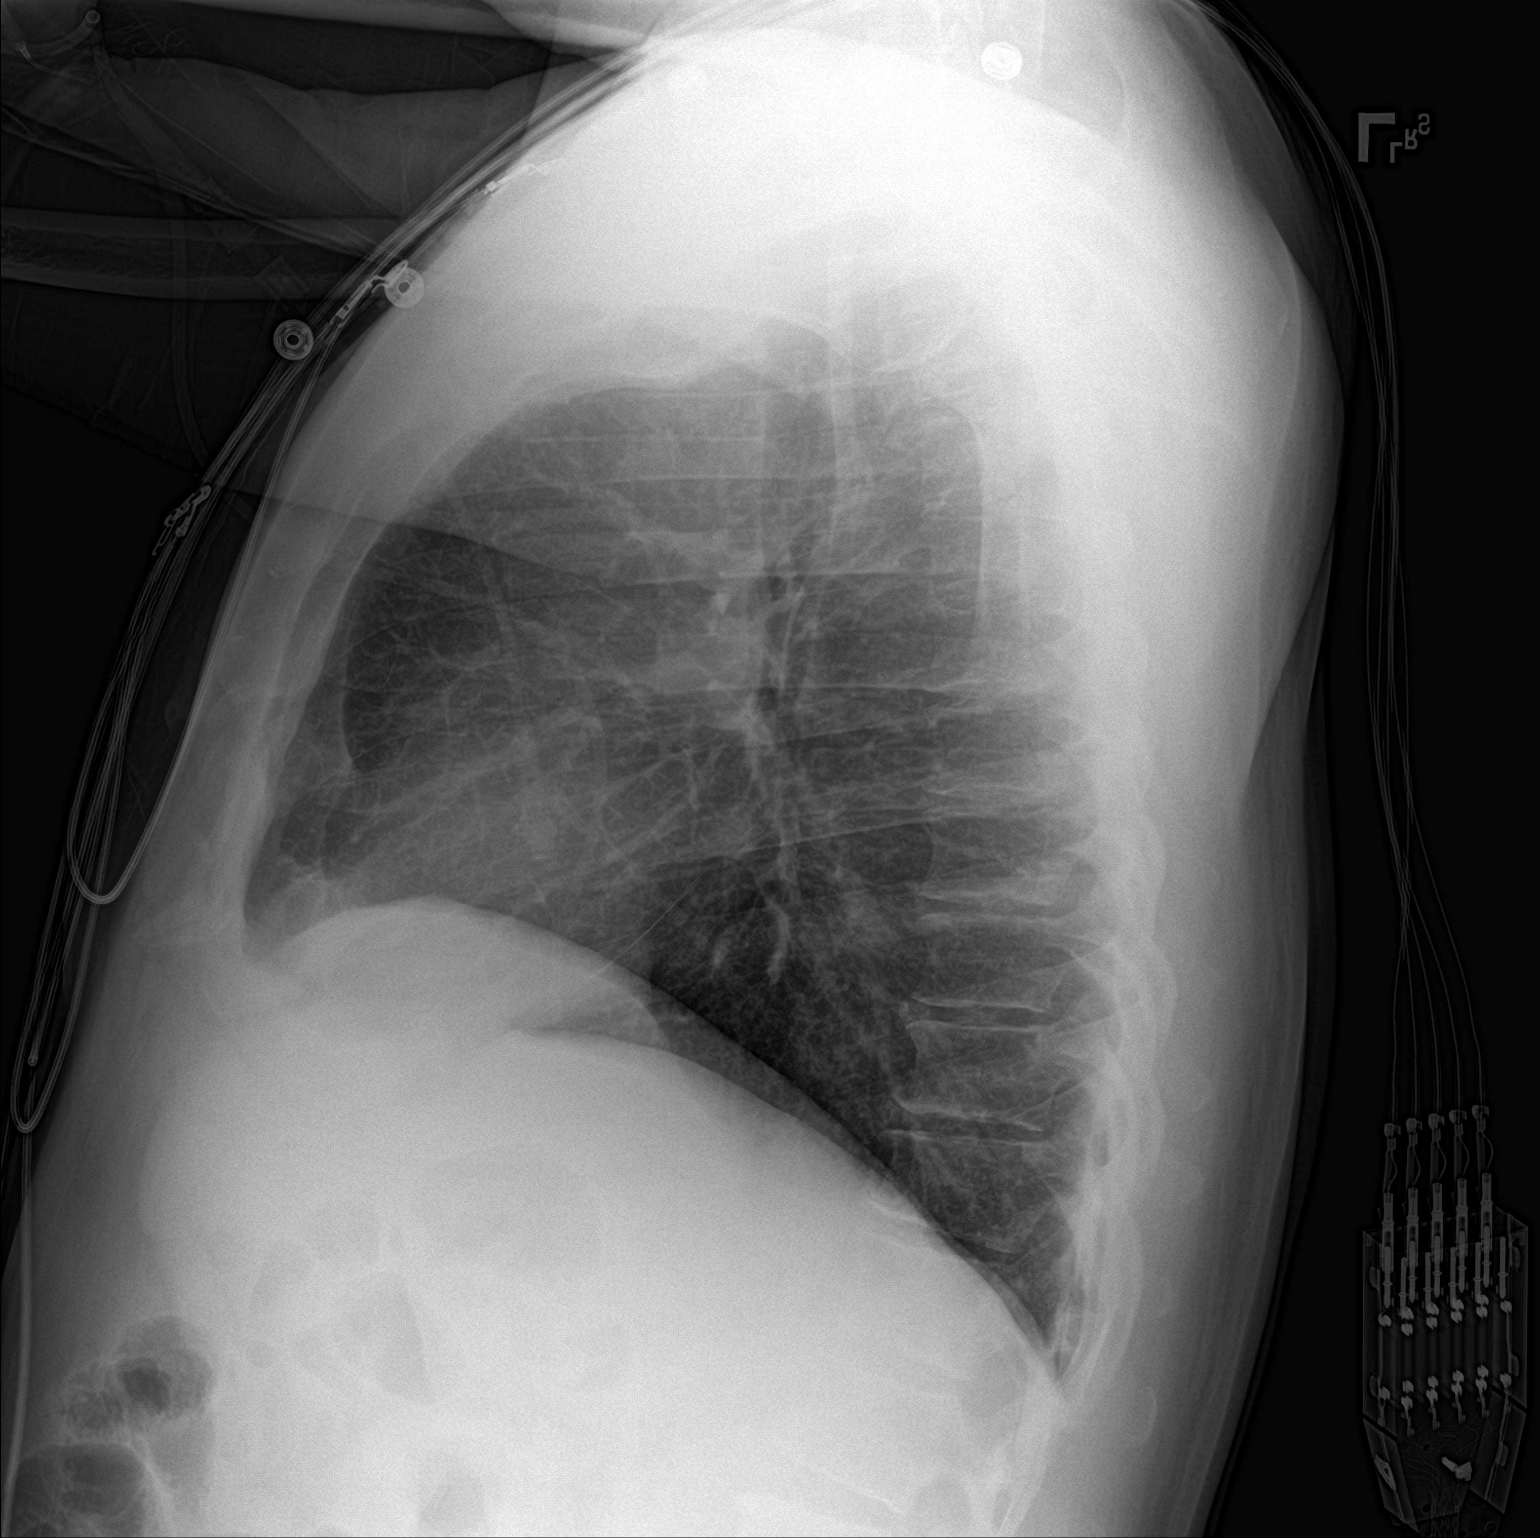

[2 of 2 positions shown; findings below may reference images not displayed]

FINDINGS: The heart size and mediastinal contours are within normal limits.
Both lungs are clear. The visualized skeletal structures are
unremarkable.
IMPRESSION: No active cardiopulmonary disease.

## 2019-08-15 ENCOUNTER — Ambulatory Visit (INDEPENDENT_AMBULATORY_CARE_PROVIDER_SITE_OTHER): Payer: Medicaid Other | Admitting: Nurse Practitioner

## 2019-08-17 ENCOUNTER — Other Ambulatory Visit: Payer: Self-pay | Admitting: Physician Assistant

## 2019-08-17 ENCOUNTER — Other Ambulatory Visit: Payer: Self-pay | Admitting: *Deleted

## 2019-08-17 MED ORDER — ISOSORBIDE MONONITRATE ER 30 MG PO TB24: 30.0000 mg | ORAL_TABLET | Freq: Every day | ORAL | 3 refills | Status: DC

## 2019-08-21 ENCOUNTER — Other Ambulatory Visit: Payer: Self-pay | Admitting: Physician Assistant

## 2019-08-21 DIAGNOSIS — Z125 Encounter for screening for malignant neoplasm of prostate: Secondary | ICD-10-CM

## 2019-08-21 DIAGNOSIS — I10 Essential (primary) hypertension: Secondary | ICD-10-CM

## 2019-08-21 DIAGNOSIS — E785 Hyperlipidemia, unspecified: Secondary | ICD-10-CM

## 2019-08-21 DIAGNOSIS — E1165 Type 2 diabetes mellitus with hyperglycemia: Secondary | ICD-10-CM

## 2019-08-31 ENCOUNTER — Other Ambulatory Visit (HOSPITAL_COMMUNITY)
Admission: RE | Admit: 2019-08-31 | Discharge: 2019-08-31 | Disposition: A | Discharge status: Home / Self Care | Payer: Medicaid Other | Source: Ambulatory Visit | Attending: Physician Assistant | Admitting: Physician Assistant

## 2019-08-31 DIAGNOSIS — Z125 Encounter for screening for malignant neoplasm of prostate: Secondary | ICD-10-CM | POA: Insufficient documentation

## 2019-08-31 DIAGNOSIS — E785 Hyperlipidemia, unspecified: Secondary | ICD-10-CM | POA: Insufficient documentation

## 2019-08-31 DIAGNOSIS — E1165 Type 2 diabetes mellitus with hyperglycemia: Secondary | ICD-10-CM | POA: Insufficient documentation

## 2019-08-31 DIAGNOSIS — I10 Essential (primary) hypertension: Secondary | ICD-10-CM | POA: Insufficient documentation

## 2019-08-31 LAB — LIPID PANEL
Cholesterol: 275 mg/dL — ABNORMAL HIGH (ref 0–200)
HDL: 33 mg/dL — ABNORMAL LOW (ref >40)
LDL Cholesterol: 164 mg/dL — ABNORMAL HIGH (ref 0–99)
Total CHOL/HDL Ratio: 8.3 RATIO
Triglycerides: 388 mg/dL — ABNORMAL HIGH (ref <150)
VLDL: 78 mg/dL — ABNORMAL HIGH (ref 0–40)

## 2019-08-31 LAB — COMPREHENSIVE METABOLIC PANEL
ALT: 12 U/L (ref 0–44)
AST: 11 U/L — ABNORMAL LOW (ref 15–41)
Albumin: 3.7 g/dL (ref 3.5–5.0)
Alkaline Phosphatase: 95 U/L (ref 38–126)
Anion gap: 10 (ref 5–15)
BUN: 20 mg/dL (ref 6–20)
CO2: 22 mmol/L (ref 22–32)
Calcium: 8.8 mg/dL — ABNORMAL LOW (ref 8.9–10.3)
Chloride: 98 mmol/L (ref 98–111)
Creatinine, Ser: 1.03 mg/dL (ref 0.61–1.24)
GFR calc Af Amer: >60 mL/min (ref >60)
GFR calc non Af Amer: >60 mL/min (ref >60)
Glucose, Bld: 384 mg/dL — ABNORMAL HIGH (ref 70–99)
Potassium: 4.7 mmol/L (ref 3.5–5.1)
Sodium: 130 mmol/L — ABNORMAL LOW (ref 135–145)
Total Bilirubin: 1 mg/dL (ref 0.3–1.2)
Total Protein: 7.5 g/dL (ref 6.5–8.1)

## 2019-08-31 LAB — HEMOGLOBIN A1C
Hgb A1c MFr Bld: 15.2 % — ABNORMAL HIGH (ref 4.8–5.6)
Mean Plasma Glucose: 389.54 mg/dL

## 2019-08-31 LAB — PSA: Prostatic Specific Antigen: 0.5 ng/mL (ref 0.00–4.00)

## 2019-09-03 ENCOUNTER — Ambulatory Visit: Payer: Self-pay | Admitting: Physician Assistant

## 2019-09-03 ENCOUNTER — Encounter: Payer: Self-pay | Admitting: Physician Assistant

## 2019-09-03 VITALS — BP 110/78 | HR 106 | Temp 97.9°F

## 2019-09-03 DIAGNOSIS — E1165 Type 2 diabetes mellitus with hyperglycemia: Secondary | ICD-10-CM

## 2019-09-03 DIAGNOSIS — Z9119 Patient's noncompliance with other medical treatment and regimen: Secondary | ICD-10-CM

## 2019-09-03 DIAGNOSIS — F17200 Nicotine dependence, unspecified, uncomplicated: Secondary | ICD-10-CM

## 2019-09-03 DIAGNOSIS — F39 Unspecified mood [affective] disorder: Secondary | ICD-10-CM

## 2019-09-03 DIAGNOSIS — E785 Hyperlipidemia, unspecified: Secondary | ICD-10-CM

## 2019-09-03 DIAGNOSIS — Z91199 Patient's noncompliance with other medical treatment and regimen due to unspecified reason: Secondary | ICD-10-CM

## 2019-09-03 DIAGNOSIS — J449 Chronic obstructive pulmonary disease, unspecified: Secondary | ICD-10-CM

## 2019-09-03 NOTE — Patient Instructions (Signed)
COVID VACCINATION APPOINTMENT  Tuesday September 11, 2019-  11:30am

## 2019-09-03 NOTE — Progress Notes (Signed)
BP 110/78   Pulse (!) 106   Temp 97.9 F (36.6 C)   SpO2 96%    Subjective:    Patient ID: [name] [name], male    DOB: [birth year], 49 y.o.   MRN: [id number]  HPI: [name] [name] is a 49 y.o. male presenting on 09/03/2019 for No chief complaint on file.   HPI    Pt had a negative covid 19 screening questionnaire.    Pt is 31yoM with uncontrolled DM,  CAD, dyslipidemia, mental health problems, tobacco use and longstanding noncompliance.  He says he is going [facility] for [place] issues.  He has had No appt here since october.  He says he has been Out of insulin for about a month.   He is unsure about what meds he is taking; he brought some with him but says he is taking others but doesn't know what.    Relevant past medical, surgical, family and social history reviewed and updated as indicated. Interim medical history since our last visit reviewed. Allergies and medications reviewed and updated.    Current Outpatient Medications:  .  acetaminophen (TYLENOL) 325 MG tablet, Take 2 tablets (650 mg total) by mouth every 6 (six) hours as needed for moderate pain., Disp: , Rfl:  .  ARIPiprazole (ABILIFY) 5 MG tablet, Take 5 mg by mouth daily., Disp: , Rfl:  .  aspirin 81 MG tablet, Take 1 tablet (81 mg total) by mouth at bedtime., Disp: 30 tablet, Rfl:  .  atorvastatin (LIPITOR) 80 MG tablet, TAKE 1 Tablet BY MOUTH EVERY DAY (Patient taking differently: Take 80 mg by mouth at bedtime. ), Disp: 90 tablet, Rfl: 1 .  DULERA 100-5 MCG/ACT AERO, INHALE 2 PUFFS BY MOUTH TWICE DAILY. RINSE MOUTH AFTER EACH USE, Disp: 13 g, Rfl: 0 .  isosorbide mononitrate (IMDUR) 30 MG 24 hr tablet, Take 1 tablet (30 mg total) by mouth daily., Disp: 90 tablet, Rfl: 3 .  metFORMIN (GLUCOPHAGE) 1000 MG tablet, TAKE 1 Tablet  BY MOUTH TWICE DAILY WITH A MEAL (Patient taking differently: Take 1,000 mg by mouth 2 (two) times daily with a meal. ), Disp: 180 tablet, Rfl: 1 .  nitroGLYCERIN (NITROSTAT) 0.4 MG SL  tablet, Place 1 tablet (0.4 mg total) under the tongue every 5 (five) minutes as needed for chest pain., Disp: 25 tablet, Rfl: 3 .  PROVENTIL HFA 108 (90 Base) MCG/ACT inhaler, INHALE 2 PUFFS BY MOUTH EVERY 6 HOURS AS NEEDED FOR COUGHING, WHEEZING, OR SHORTNESS OF BREATH, Disp: 6.7 g, Rfl: 0 .  buPROPion (WELLBUTRIN SR) 150 MG 12 hr tablet, Take 300 mg by mouth daily., Disp: , Rfl:  .  busPIRone (BUSPAR) 5 MG tablet, Take 5-10 mg by mouth See admin instructions. Take 1 tablet (5mg ) by mouth in the morning & take 2 tablets (10 mg) by mouth at night., Disp: , Rfl:  .  gabapentin (NEURONTIN) 300 MG capsule, Take 1 capsule (300 mg total) by mouth 2 (two) times daily., Disp: 60 capsule, Rfl: 2 .  indomethacin (INDOCIN) 50 MG capsule, Take 1 capsule (50 mg total) by mouth 2 (two) times daily with a meal., Disp: 30 capsule, Rfl: 1 .  insulin aspart (NOVOLOG) 100 UNIT/ML injection, Before each meal 3 times a day, 140-199 - 2 units, 200-250 - 4 units, 251-299 - 6 units,  300-349 - 8 units,  350 or above 10 units. Dispense syringes and needles as needed, Ok to switch to PEN if approved. Substitute to any  brand approved. DX DM2, Code E11.65 (Patient not taking: Reported on 09/03/2019), Disp: 1 vial, Rfl: 0 .  Insulin Glargine (LANTUS SOLOSTAR) 100 UNIT/ML Solostar Pen, Inject 40 Units into the skin at bedtime. (Patient not taking: Reported on 09/03/2019), Disp: 1 pen, Rfl: 3 .  metoCLOPramide (REGLAN) 5 MG tablet, Take 1 tablet (5 mg total) by mouth 3 (three) times daily before meals., Disp: 90 tablet, Rfl: 1 .  ondansetron (ZOFRAN ODT) 4 MG disintegrating tablet, Take 1 tablet (4 mg total) by mouth every 8 (eight) hours as needed for nausea., Disp: 10 tablet, Rfl: 0 .  pantoprazole (PROTONIX) 40 MG tablet, Take 1 tablet (40 mg total) by mouth 2 (two) times daily before a meal., Disp: 60 tablet, Rfl: 5 .  promethazine (PHENERGAN) 25 MG suppository, Place 1 suppository (25 mg total) rectally every 8 (eight) hours as  needed for refractory nausea / vomiting., Disp: 12 each, Rfl: 0 .  PROZAC 40 MG capsule, Take 40 mg by mouth every morning., Disp: , Rfl:     Review of Systems  Per HPI unless specifically indicated above     Objective:    BP 110/78   Pulse (!) 106   Temp 97.9 F (36.6 C)   SpO2 96%   Wt Readings from Last 3 Encounters:  04/18/19 173 lb (78.5 kg)  03/23/19 177 lb (80.3 kg)  03/21/19 177 lb (80.3 kg)    Physical Exam Vitals reviewed.  Constitutional:      General: He is not in acute distress.    Appearance: He is well-developed. He is not toxic-appearing.  HENT:     Head: Normocephalic and atraumatic.  Cardiovascular:     Rate and Rhythm: Normal rate and regular rhythm.  Pulmonary:     Effort: Pulmonary effort is normal.     Breath sounds: Normal breath sounds. No wheezing.  Abdominal:     General: Bowel sounds are normal.     Palpations: Abdomen is soft.     Tenderness: There is no abdominal tenderness.  Musculoskeletal:     Cervical back: Neck supple.     Right lower leg: No edema.     Left lower leg: No edema.  Lymphadenopathy:     Cervical: No cervical adenopathy.  Skin:    General: Skin is warm and dry.  Neurological:     Mental Status: He is alert and oriented to person, place, and time.  Psychiatric:        Attention and Perception: Attention normal.        Behavior: Behavior normal.     Results for orders placed or performed during the hospital encounter of 08/31/19  PSA  Result Value Ref Range   Prostatic Specific Antigen 0.50 0.00 - 4.00 ng/mL  Hemoglobin A1c  Result Value Ref Range   Hgb A1c MFr Bld 15.2 (H) 4.8 - 5.6 %   Mean Plasma Glucose 389.54 mg/dL  Lipid panel  Result Value Ref Range   Cholesterol 275 (H) 0 - 200 mg/dL   Triglycerides 761 (H) <150 mg/dL   HDL 33 (L) >60 mg/dL   Total CHOL/HDL Ratio 8.3 RATIO   VLDL 78 (H) 0 - 40 mg/dL   LDL Cholesterol 737 (H) 0 - 99 mg/dL  Comprehensive metabolic panel  Result Value Ref Range    Sodium 130 (L) 135 - 145 mmol/L   Potassium 4.7 3.5 - 5.1 mmol/L   Chloride 98 98 - 111 mmol/L   CO2 22 22 - 32 mmol/L  Glucose, Bld 384 (H) 70 - 99 mg/dL   BUN 20 6 - 20 mg/dL   Creatinine, Ser 0.37 0.61 - 1.24 mg/dL   Calcium 8.8 (L) 8.9 - 10.3 mg/dL   Total Protein 7.5 6.5 - 8.1 g/dL   Albumin 3.7 3.5 - 5.0 g/dL   AST 11 (L) 15 - 41 U/L   ALT 12 0 - 44 U/L   Alkaline Phosphatase 95 38 - 126 U/L   Total Bilirubin 1.0 0.3 - 1.2 mg/dL   GFR calc non Af Amer >60 >60 mL/min   GFR calc Af Amer >60 >60 mL/min   Anion gap 10 5 - 15      Assessment & Plan:    Encounter Diagnoses  Name Primary?  Marland Kitchen Uncontrolled type 2 diabetes mellitus with hyperglycemia (HCC) Yes  . Hyperlipidemia, unspecified hyperlipidemia type   . Chronic obstructive pulmonary disease, unspecified COPD type (HCC)   . Tobacco use disorder   . Mood disorder (HCC)   . Personal history of noncompliance with medical treatment, presenting hazards to health      -Reviewed labs with pt -Pt was scheduled for covid vaccination -pt was urged to monitor his bs -Pt reminded to bring ALL meds to his appts  -He is given insulin samples -pt to follow up 4-6wk with bs log.  He is to contact office sooner prn

## 2019-09-06 ENCOUNTER — Ambulatory Visit: Payer: Self-pay | Admitting: Physician Assistant

## 2019-09-06 ENCOUNTER — Encounter: Payer: Self-pay | Admitting: Physician Assistant

## 2019-09-06 VITALS — HR 99 | Temp 96.3°F

## 2019-09-06 DIAGNOSIS — E1165 Type 2 diabetes mellitus with hyperglycemia: Secondary | ICD-10-CM

## 2019-09-06 DIAGNOSIS — S91301A Unspecified open wound, right foot, initial encounter: Secondary | ICD-10-CM

## 2019-09-06 NOTE — Patient Instructions (Signed)
Diabetes Mellitus and Foot Care Foot care is an important part of your health, especially when you have diabetes. Diabetes may cause you to have problems because of poor blood flow (circulation) to your feet and legs, which can cause your skin to:  Become thinner and drier.  Break more easily.  Heal more slowly.  Peel and crack. You may also have nerve damage (neuropathy) in your legs and feet, causing decreased feeling in them. This means that you may not notice minor injuries to your feet that could lead to more serious problems. Noticing and addressing any potential problems early is the best way to prevent future foot problems. How to care for your feet Foot hygiene  Wash your feet daily with warm water and mild soap. Do not use hot water. Then, pat your feet and the areas between your toes until they are completely dry. Do not soak your feet as this can dry your skin.  Trim your toenails straight across. Do not dig under them or around the cuticle. File the edges of your nails with an emery board or nail file.  Apply a moisturizing lotion or petroleum jelly to the skin on your feet and to dry, brittle toenails. Use lotion that does not contain alcohol and is unscented. Do not apply lotion between your toes. Shoes and socks  Wear clean socks or stockings every day. Make sure they are not too tight. Do not wear knee-high stockings since they may decrease blood flow to your legs.  Wear shoes that fit properly and have enough cushioning. Always look in your shoes before you put them on to be sure there are no objects inside.  To break in new shoes, wear them for just a few hours a day. This prevents injuries on your feet. Wounds, scrapes, corns, and calluses  Check your feet daily for blisters, cuts, bruises, sores, and redness. If you cannot see the bottom of your feet, use a mirror or ask someone for help.  Do not cut corns or calluses or try to remove them with medicine.  If you  find a minor scrape, cut, or break in the skin on your feet, keep it and the skin around it clean and dry. You may clean these areas with mild soap and water. Do not clean the area with peroxide, alcohol, or iodine.  If you have a wound, scrape, corn, or callus on your foot, look at it several times a day to make sure it is healing and not infected. Check for: ? Redness, swelling, or pain. ? Fluid or blood. ? Warmth. ? Pus or a bad smell. General instructions  Do not cross your legs. This may decrease blood flow to your feet.  Do not use heating pads or hot water bottles on your feet. They may burn your skin. If you have lost feeling in your feet or legs, you may not know this is happening until it is too late.  Protect your feet from hot and cold by wearing shoes, such as at the beach or on hot pavement.  Schedule a complete foot exam at least once a year (annually) or more often if you have foot problems. If you have foot problems, report any cuts, sores, or bruises to your health care provider immediately. Contact a health care provider if:  You have a medical condition that increases your risk of infection and you have any cuts, sores, or bruises on your feet.  You have an injury that is not   healing.  You have redness on your legs or feet.  You feel burning or tingling in your legs or feet.  You have pain or cramps in your legs and feet.  Your legs or feet are numb.  Your feet always feel cold.  You have pain around a toenail. Get help right away if:  You have a wound, scrape, corn, or callus on your foot and: ? You have pain, swelling, or redness that gets worse. ? You have fluid or blood coming from the wound, scrape, corn, or callus. ? Your wound, scrape, corn, or callus feels warm to the touch. ? You have pus or a bad smell coming from the wound, scrape, corn, or callus. ? You have a fever. ? You have a red line going up your leg. Summary  Check your feet every day  for cuts, sores, red spots, swelling, and blisters.  Moisturize feet and legs daily.  Wear shoes that fit properly and have enough cushioning.  If you have foot problems, report any cuts, sores, or bruises to your health care provider immediately.  Schedule a complete foot exam at least once a year (annually) or more often if you have foot problems. This information is not intended to replace advice given to you by your health care provider. Make sure you discuss any questions you have with your health care provider. Document Revised: 02/07/2019 Document Reviewed: 06/18/2016 Elsevier Patient Education  2020 [employer]  

## 2019-09-06 NOTE — Progress Notes (Signed)
   Pulse 99   Temp (!) 96.3 F (35.7 C)   SpO2 96%    Subjective:    Patient ID: [name] [name], male    DOB: 10-May-1971, 49 y.o.   MRN: [id number]  HPI: [name] [name] is a 49 y.o. male presenting on 09/06/2019 for No chief complaint on file.   HPI   Pt had a negative covid 19 screening questionnaire    Pt is 49yoM with uncontrolled DM and history of noncompliance with treatment.  He is seen in office on 09/03/19 for routine appointment.  Pt is in today due to open wound on foot.  Pt says he Was walking around in his crocs and no socks and got blister.  He Noticed it 2 days ago.  He denies redness or drainage.     Relevant past medical, surgical, family and social history reviewed and updated as indicated. Interim medical history since our last visit reviewed. Allergies and medications reviewed and updated.  Review of Systems  Per HPI unless specifically indicated above     Objective:    Pulse 99   Temp (!) 96.3 F (35.7 C)   SpO2 96%   Wt Readings from Last 3 Encounters:  04/18/19 173 lb (78.5 kg)  03/23/19 177 lb (80.3 kg)  03/21/19 177 lb (80.3 kg)    Physical Exam Constitutional:      General: He is not in acute distress.    Appearance: He is not ill-appearing or toxic-appearing.  HENT:     Head: Normocephalic and atraumatic.  Pulmonary:     Effort: Pulmonary effort is normal. No respiratory distress.  Musculoskeletal:       Feet:  Feet:     Right foot:     Skin integrity: Blister present. No erythema or warmth.     Comments: Plantar surface right foot with area of skin missing < 1 cm in diameter.  There is no ulceration or depth to the wound.  There is no redness, drainage or odor.   Neurological:     Mental Status: He is alert and oriented to person, place, and time.  Psychiatric:        Attention and Perception: Attention normal.        Speech: Speech normal.        Behavior: Behavior is cooperative.            Assessment & Plan:     Encounter Diagnoses  Name Primary?  . Open wound of right foot, initial encounter Yes  . Uncontrolled type 2 diabetes mellitus with hyperglycemia ([facility])       Pt counseled on wound care including daily wash with soap and water, avoid H2O2, avoid alcohol, keep it clean.  He is counseled to RTO immediately for any redness swelling drainage signs infection  Pt counseled again on diabetic foot care including wearing socks with his shoes.  He is given reading information on this  Follow up as scheduled.  RTO sooner prn

## 2019-10-09 ENCOUNTER — Other Ambulatory Visit: Payer: Self-pay | Admitting: Physician Assistant

## 2019-10-11 ENCOUNTER — Ambulatory Visit (INDEPENDENT_AMBULATORY_CARE_PROVIDER_SITE_OTHER): Payer: Medicaid Other | Admitting: Gastroenterology

## 2019-10-11 ENCOUNTER — Other Ambulatory Visit: Payer: Self-pay | Admitting: Physician Assistant

## 2019-10-11 MED ORDER — FLUTICASONE-SALMETEROL 100-50 MCG/DOSE IN AEPB
1.0000 | INHALATION_SPRAY | Freq: Two times a day (BID) | RESPIRATORY_TRACT | 0 refills | Status: DC
Start: 2019-10-11 — End: 2019-12-10

## 2019-10-15 ENCOUNTER — Encounter: Payer: Self-pay | Admitting: Physician Assistant

## 2019-10-15 ENCOUNTER — Ambulatory Visit: Payer: Self-pay | Admitting: Physician Assistant

## 2019-10-15 VITALS — BP 132/82 | HR 96 | Temp 97.5°F | Wt 167.7 lb

## 2019-10-15 DIAGNOSIS — E1165 Type 2 diabetes mellitus with hyperglycemia: Secondary | ICD-10-CM

## 2019-10-15 MED ORDER — LANTUS SOLOSTAR 100 UNIT/ML ~~LOC~~ SOPN
45.0000 [IU] | PEN_INJECTOR | Freq: Every day | SUBCUTANEOUS | Status: DC
Start: 2019-10-15 — End: 2020-01-17

## 2019-10-15 NOTE — Progress Notes (Signed)
BP 132/82   Pulse 96   Temp (!) 97.5 F (36.4 C)   Wt 167 lb 11.2 oz (76.1 kg)   SpO2 98%   BMI 25.50 kg/m    Subjective:    Patient ID: [name] [name], male    DOB: Aug 18, 1970, 49 y.o.   MRN: [id number]  HPI: [name] [name] is a 49 y.o. male presenting on 10/15/2019 for Diabetes   HPI   Pt had a negative covid 19 screening questionnaire.   Pt is 49yoM with uncontrolled DM,  CAD, dyslipidemia, mental health problems, tobacco use and longstanding noncompliance.   He has appointment today for DM and to review bs log.  He Didn't bring his bs log.  He says in the mornings it is usually around 140.  He said Once it was 405 later in the day shortly after he had eaten and drank a lot of OJ.  He got a new meter recently to make sure he got good numbers  Pt got both covid vaccination shots.     Relevant past medical, surgical, family and social history reviewed and updated as indicated. Interim medical history since our last visit reviewed. Allergies and medications reviewed and updated.    Current Outpatient Medications:  .  acetaminophen (TYLENOL) 325 MG tablet, Take 2 tablets (650 mg total) by mouth every 6 (six) hours as needed for moderate pain., Disp: , Rfl:  .  ARIPiprazole (ABILIFY) 5 MG tablet, Take 5 mg by mouth daily., Disp: , Rfl:  .  aspirin 81 MG tablet, Take 1 tablet (81 mg total) by mouth at bedtime., Disp: 30 tablet, Rfl:  .  atorvastatin (LIPITOR) 80 MG tablet, TAKE 1 Tablet BY MOUTH ONCE EVERY DAY, Disp: 90 tablet, Rfl: 0 .  DULERA 100-5 MCG/ACT AERO, INHALE 2 PUFFS BY MOUTH TWICE DAILY. RINSE MOUTH AFTER EACH USE, Disp: 13 g, Rfl: 0 .  insulin aspart (NOVOLOG) 100 UNIT/ML injection, Before each meal 3 times a day, 140-199 - 2 units, 200-250 - 4 units, 251-299 - 6 units,  300-349 - 8 units,  350 or above 10 units. Dispense syringes and needles as needed, Ok to switch to PEN if approved. Substitute to any brand approved. DX DM2, Code E11.65, Disp: 1 vial, Rfl:  0 .  insulin glargine (LANTUS SOLOSTAR) 100 UNIT/ML Solostar Pen, Inject 45 Units into the skin at bedtime., Disp: , Rfl:  .  isosorbide mononitrate (IMDUR) 30 MG 24 hr tablet, Take 1 tablet (30 mg total) by mouth daily., Disp: 90 tablet, Rfl: 3 .  metFORMIN (GLUCOPHAGE) 1000 MG tablet, TAKE 1 Tablet  BY MOUTH TWICE DAILY WITH A MEAL, Disp: 180 tablet, Rfl: 0 .  nitroGLYCERIN (NITROSTAT) 0.4 MG SL tablet, Place 1 tablet (0.4 mg total) under the tongue every 5 (five) minutes as needed for chest pain., Disp: 25 tablet, Rfl: 3 .  ondansetron (ZOFRAN ODT) 4 MG disintegrating tablet, Take 1 tablet (4 mg total) by mouth every 8 (eight) hours as needed for nausea., Disp: 10 tablet, Rfl: 0 .  promethazine (PHENERGAN) 25 MG suppository, Place 1 suppository (25 mg total) rectally every 8 (eight) hours as needed for refractory nausea / vomiting., Disp: 12 each, Rfl: 0 .  PROVENTIL HFA 108 (90 Base) MCG/ACT inhaler, INHALE 2 PUFFS BY MOUTH EVERY 6 HOURS AS NEEDED FOR COUGHING, WHEEZING, OR SHORTNESS OF BREATH, Disp: 6.7 g, Rfl: 0 .  buPROPion (WELLBUTRIN SR) 150 MG 12 hr tablet, Take 300 mg by mouth daily., Disp: ,  Rfl:  .  busPIRone (BUSPAR) 5 MG tablet, Take 5-10 mg by mouth See admin instructions. Take 1 tablet (5mg ) by mouth in the morning & take 2 tablets (10 mg) by mouth at night., Disp: , Rfl:  .  Fluticasone-Salmeterol (ADVAIR DISKUS) 100-50 MCG/DOSE AEPB, Inhale 1 puff into the lungs 2 (two) times daily. (Patient not taking: Reported on 10/15/2019), Disp: 60 each, Rfl: 0 .  gabapentin (NEURONTIN) 300 MG capsule, Take 1 capsule (300 mg total) by mouth 2 (two) times daily. (Patient not taking: Reported on 10/15/2019), Disp: 60 capsule, Rfl: 2 .  indomethacin (INDOCIN) 50 MG capsule, Take 1 capsule (50 mg total) by mouth 2 (two) times daily with a meal. (Patient not taking: Reported on 10/15/2019), Disp: 30 capsule, Rfl: 1 .  metoCLOPramide (REGLAN) 5 MG tablet, Take 1 tablet (5 mg total) by mouth 3 (three)  times daily before meals. (Patient not taking: Reported on 10/15/2019), Disp: 90 tablet, Rfl: 1 .  pantoprazole (PROTONIX) 40 MG tablet, Take 1 tablet (40 mg total) by mouth 2 (two) times daily before a meal. (Patient not taking: Reported on 10/15/2019), Disp: 60 tablet, Rfl: 5 .  PROZAC 40 MG capsule, Take 40 mg by mouth every morning., Disp: , Rfl:      Review of Systems  Per HPI unless specifically indicated above     Objective:    BP 132/82   Pulse 96   Temp (!) 97.5 F (36.4 C)   Wt 167 lb 11.2 oz (76.1 kg)   SpO2 98%   BMI 25.50 kg/m   Wt Readings from Last 3 Encounters:  10/15/19 167 lb 11.2 oz (76.1 kg)  04/18/19 173 lb (78.5 kg)  03/23/19 177 lb (80.3 kg)    Physical Exam Vitals reviewed.  Constitutional:      General: He is not in acute distress.    Appearance: He is well-developed.  HENT:     Head: Normocephalic and atraumatic.  Cardiovascular:     Rate and Rhythm: Normal rate and regular rhythm.  Pulmonary:     Effort: Pulmonary effort is normal.     Breath sounds: Normal breath sounds. No wheezing.  Abdominal:     General: Bowel sounds are normal.     Palpations: Abdomen is soft.     Tenderness: There is no abdominal tenderness.  Musculoskeletal:     Cervical back: Neck supple.     Right lower leg: No edema.     Left lower leg: No edema.  Lymphadenopathy:     Cervical: No cervical adenopathy.  Skin:    General: Skin is warm and dry.  Neurological:     Mental Status: He is alert and oriented to person, place, and time.  Psychiatric:        Behavior: Behavior normal.          Assessment & Plan:    Encounter Diagnosis  Name Primary?  Marland Kitchen Uncontrolled type 2 diabetes mellitus with hyperglycemia ([place]) Yes     Pt counseled on diabetic diet.  Discussed that OJ is very high is sugar and should not be consumed regularly and when it is, just small quantities.  No changes to insulin or meds today.  Pt is to continue to monitor his bs and is reminded  to call office for fbs < 70 or > 300.  He will follow up in 6 weeks.  He is to contact office sooner prn.

## 2019-11-21 NOTE — Progress Notes (Signed)
Patient profile: [name] [name] is a 49 y.o. male seen for evaluation of continued GI symptoms of GERD, abd pain   History of Present Illness: [name] [name] is seen today and reports continued symptoms of GERD, patient reports having significant issues with esophageal burning, feels that he has bad burps and a rotten egg sensation in his mouth. He is currently out of his PPI and not on PPI. He has been on reglan in the past about 3 times a day but did not feel it helped significantly.  He has not had to use Phenergan suppositories recently.  He takes ibuprofen 1-2 times a week.  His weight is down about 6 pounds in the past 7 months due to the symptoms.  He reports cramping in his lower abdomen, feels like he has to go to the bathroom a lot at times thinking.  Describes the cramping is daily.  Does improve with defecation at times.  To be moving stools daily or every other day.  Feels he gets adequate fiber but has had trouble with food such as apples due to not having teeth.  Very scant rectal bleeding that he attributes to hemorrhoids.  He reports his father had colon polyps.  He smokes 1 pack a day.  He denies alcohol intake.  Reports his blood sugars recently have been running in the 200s.  Wt Readings from Last 3 Encounters:  11/22/19 164 lb 9.6 oz (74.7 kg)  10/15/19 167 lb 11.2 oz (76.1 kg)  04/18/19 173 lb (78.5 kg)     Last Endoscopy 03/2019 -  - Normal proximal esophagus and mid esophagus.  - Esophageal mucosal changes consistent with short-segment Barrett's esophagus. Biopsied.  - Z-line irregular, 37 cm from the incisors.  - 2 cm hiatal hernia.  - Portal hypertensive gastropathy.  - Normal duodenal bulb and second portion of the duodenum  Last colonoscopy - not on file.   Past Medical History:  Past Medical History:  Diagnosis Date  . Anxiety   . Arthritis   . CAD (coronary artery disease)    Moderate LAD disease 2016 - Dr. Jacinto Halim  . Chronic bronchitis (HCC)   .  Chronic upper back pain   . COPD (chronic obstructive pulmonary disease) (HCC)   . Depression   . Diabetic peripheral neuropathy (HCC)   . GERD (gastroesophageal reflux disease)   . History of gout   . Hyperlipemia   . Hypertension   . Migraine   . Noncompliance   . 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 Sleep apnea 2016   "could not afford CPAP".  . Type 2 diabetes mellitus (HCC)     Problem List: Patient Active Problem List   Diagnosis Date Noted  . Esophagitis, reflux 01/23/2019  . Barrett's esophagus 01/23/2019  . Diarrhea 01/23/2019  . Ulcerative esophagitis 01/23/2019  . MDD (major depressive disorder), severe (HCC) 12/17/2018  . DKA, type 2 (HCC) 11/07/2018  . High anion gap metabolic acidosis 03/22/2018  . Epigastric abdominal pain 03/22/2018  . Nausea vomiting and diarrhea 03/22/2018  . AKI (acute kidney injury) (HCC) 03/22/2018  . Uncontrolled diabetes mellitus (HCC) 03/22/2018  . CAD (coronary artery disease) 02/15/2018  . Hyperlipidemia 02/15/2018  . Tobacco abuse 02/15/2018  . Diabetic gastroparesis (HCC) 06/02/2017  . ARF (acute renal failure) (HCC) 05/30/2017  . DKA (diabetic ketoacidoses) (HCC) 10/15/2016  . Gastroesophageal reflux disease 10/15/2016  . Anxiety with depression 10/15/2016  . HTN (hypertension) 10/15/2016  . Leukocytosis 11/25/2015  .  Intractable nausea and vomiting 11/25/2015  . Dental abscess 11/25/2015  . Nausea and vomiting 11/25/2015  . Chest pain 03/27/2015    Past Surgical History: Past Surgical History:  Procedure Laterality Date  . ANKLE SURGERY Right 1982   "had extra bones in there; took them out"  . APPENDECTOMY  1975  . BIOPSY  12/26/2018   Procedure: BIOPSY;  Surgeon: Malissa Hippo, MD;  Location: [facility];  Service: Endoscopy;;  duodenal biopsies  . BIOPSY  03/23/2019   Procedure: BIOPSY;  Surgeon: Malissa Hippo, MD;  Location: [facility];  Service: Endoscopy;;  esophagus  . CARDIAC CATHETERIZATION N/A  03/28/2015   Procedure: Left Heart Cath and Coronary Angiography;  Surgeon: Yates Decamp, MD;  Location: [facility] INVASIVE CV LAB;  Service: Cardiovascular;  Laterality: N/A;  . CARDIAC CATHETERIZATION N/A 03/28/2015   Procedure: Intravascular Pressure Wire/FFR Study;  Surgeon: Yates Decamp, MD;  Location: [facility] INVASIVE CV LAB;  Service: Cardiovascular;  Laterality: N/A;  . CARPAL TUNNEL RELEASE Left ~ 2008  . COLONOSCOPY WITH ESOPHAGOGASTRODUODENOSCOPY (EGD)    . ELBOW FRACTURE SURGERY Left ~ 2008  . ESOPHAGOGASTRODUODENOSCOPY N/A 12/26/2018   Procedure: ESOPHAGOGASTRODUODENOSCOPY (EGD);  Surgeon: Malissa Hippo, MD;  Location: [facility];  Service: Endoscopy;  Laterality: N/A;  . ESOPHAGOGASTRODUODENOSCOPY (EGD) WITH PROPOFOL N/A 03/23/2019   Procedure: ESOPHAGOGASTRODUODENOSCOPY (EGD) WITH PROPOFOL;  Surgeon: Malissa Hippo, MD;  Location: [facility];  Service: Endoscopy;  Laterality: N/A;  7:30  . FRACTURE SURGERY     ankle and elbow  . PILONIDAL CYST EXCISION N/A 03/24/2017   Procedure: EXCISION CHRONIC  PILONIDAL ABSCESS;  Surgeon: [name] Miyamoto, MD;  Location: [facility];  Service: General;  Laterality: N/A;  . TENDON REPAIR Left ~ 2004   "main tendon in my ankle"    Allergies: Allergies  Allergen Reactions  . Omeprazole Magnesium Swelling    Face swells, no breathing impairment  . Esomeprazole Swelling    Face swells, no breathing impairment      Home Medications:  Current Outpatient Medications:  .  acetaminophen (TYLENOL) 325 MG tablet, Take 2 tablets (650 mg total) by mouth every 6 (six) hours as needed for moderate pain., Disp: , Rfl:  .  ARIPiprazole (ABILIFY) 5 MG tablet, Take 5 mg by mouth daily., Disp: , Rfl:  .  aspirin 81 MG tablet, Take 1 tablet (81 mg total) by mouth at bedtime., Disp: 30 tablet, Rfl:  .  atorvastatin (LIPITOR) 80 MG tablet, TAKE 1 Tablet BY MOUTH ONCE EVERY DAY, Disp: 90 tablet, Rfl: 0 .  busPIRone (BUSPAR) 5 MG tablet, Take 5-10 mg by mouth See  admin instructions. Take 1 tablet (5mg ) by mouth in the morning & take 2 tablets (10 mg) by mouth at night., Disp: , Rfl:  .  DULERA 100-5 MCG/ACT AERO, INHALE 2 PUFFS BY MOUTH TWICE DAILY. RINSE MOUTH AFTER EACH USE, Disp: 13 g, Rfl: 0 .  insulin aspart (NOVOLOG) 100 UNIT/ML injection, Before each meal 3 times a day, 140-199 - 2 units, 200-250 - 4 units, 251-299 - 6 units,  300-349 - 8 units,  350 or above 10 units. Dispense syringes and needles as needed, Ok to switch to PEN if approved. Substitute to any brand approved. DX DM2, Code E11.65, Disp: 1 vial, Rfl: 0 .  insulin glargine (LANTUS SOLOSTAR) 100 UNIT/ML Solostar Pen, Inject 45 Units into the skin at bedtime., Disp: , Rfl:  .  isosorbide mononitrate (IMDUR) 30 MG 24 hr tablet, Take  1 tablet (30 mg total) by mouth daily., Disp: 90 tablet, Rfl: 3 .  metFORMIN (GLUCOPHAGE) 1000 MG tablet, TAKE 1 Tablet  BY MOUTH TWICE DAILY WITH A MEAL, Disp: 180 tablet, Rfl: 0 .  nitroGLYCERIN (NITROSTAT) 0.4 MG SL tablet, Place 1 tablet (0.4 mg total) under the tongue every 5 (five) minutes as needed for chest pain., Disp: 25 tablet, Rfl: 3 .  ondansetron (ZOFRAN ODT) 4 MG disintegrating tablet, Take 1 tablet (4 mg total) by mouth every 8 (eight) hours as needed for nausea., Disp: 10 tablet, Rfl: 0 .  promethazine (PHENERGAN) 25 MG suppository, Place 1 suppository (25 mg total) rectally every 8 (eight) hours as needed for refractory nausea / vomiting., Disp: 12 each, Rfl: 0 .  PROVENTIL HFA 108 (90 Base) MCG/ACT inhaler, INHALE 2 PUFFS BY MOUTH EVERY 6 HOURS AS NEEDED FOR COUGHING, WHEEZING, OR SHORTNESS OF BREATH, Disp: 6.7 g, Rfl: 0 .  sertraline (ZOLOFT) 50 MG tablet, 1/2 po qhs x5 days then one po qhs thereafter, Disp: , Rfl:  .  buPROPion (WELLBUTRIN SR) 150 MG 12 hr tablet, Take 300 mg by mouth daily. (Patient not taking: Reported on 11/22/2019), Disp: , Rfl:  .  dicyclomine (BENTYL) 10 MG capsule, Take 1 capsule (10 mg total) by mouth 3 (three) times daily  as needed (abd cramping)., Disp: 90 capsule, Rfl: 1 .  Fluticasone-Salmeterol (ADVAIR DISKUS) 100-50 MCG/DOSE AEPB, Inhale 1 puff into the lungs 2 (two) times daily. (Patient not taking: Reported on 10/15/2019), Disp: 60 each, Rfl: 0 .  gabapentin (NEURONTIN) 300 MG capsule, Take 1 capsule (300 mg total) by mouth 2 (two) times daily. (Patient not taking: Reported on 10/15/2019), Disp: 60 capsule, Rfl: 2 .  indomethacin (INDOCIN) 50 MG capsule, Take 1 capsule (50 mg total) by mouth 2 (two) times daily with a meal. (Patient not taking: Reported on 10/15/2019), Disp: 30 capsule, Rfl: 1 .  metoCLOPramide (REGLAN) 5 MG tablet, Take 1 tablet (5 mg total) by mouth 4 (four) times daily -  before meals and at bedtime., Disp: 120 tablet, Rfl: 1 .  pantoprazole (PROTONIX) 40 MG tablet, Take 1 tablet (40 mg total) by mouth 2 (two) times daily before a meal., Disp: 60 tablet, Rfl: 11 .  PROZAC 40 MG capsule, Take 40 mg by mouth every morning. (Patient not taking: Reported on 11/22/2019), Disp: , Rfl:  .  Wheat Dextrin (BENEFIBER) POWD, Take 1 Scoop by mouth daily., Disp: 500 g, Rfl: 0   Family History: family history includes Congestive Heart Failure in his sister; Diabetes in his father and mother; Hypertension in his father and mother.    Social History:   reports that he has been smoking cigarettes. He has a 8.50 pack-year smoking history. He has never used smokeless tobacco. He reports that he does not drink alcohol and does not use drugs.   Review of Systems: Constitutional: Denies weight loss/weight gain  Eyes: No changes in vision. ENT: No oral lesions, sore throat.  GI: see HPI.  Heme/Lymph: No easy bruising.  CV: No chest pain.  GU: No hematuria.  Integumentary: No rashes.  Neuro: No headaches.  Psych: No depression/anxiety.  Endocrine: No heat/cold intolerance.  Allergic/Immunologic: No urticaria.  Resp: No cough, SOB.  Musculoskeletal: No joint swelling.    Physical Examination: BP 115/75  (BP Location: Right Arm, Patient Position: Sitting, Cuff Size: Normal)   Pulse (!) 106   Temp 98.5 F (36.9 C) (Oral)   Ht 5\' 8"  (1.727 m)  Wt 164 lb 9.6 oz (74.7 kg)   BMI 25.03 kg/m  Gen: NAD, alert and oriented x 4 HEENT: PEERLA, EOMI, Neck: supple, no JVD Chest: CTA bilaterally, no wheezes, crackles, or other adventitious sounds CV: RRR, no m/g/c/r Abd: soft, NT, ND, +BS in all four quadrants; no HSM, guarding, ridigity, or rebound tenderness Ext: no edema, well perfused with 2+ pulses, Skin: no rash or lesions noted on observed skin Lymph: no noted LAD  Data Reviewed:  08/2019--CMP with Na 130, glucose 384, Ca 8.8, AST 11, A1c 15   Colonoscopy from [facility] requested.   Assessment/Plan: Mr. Weedman is a 49 y.o. male seen for worsening GERD, lower abdominal pain.  1.  GERD-he has been off of his PPI as well as Reglan.  He did not see a significant benefit in Reglan 5 mg 3 times daily, will increase to 4 times daily, could consider increasing to 10 mg dose as well.  His last A1c was 15, trying to control sugars better. He had an upper endoscopy in October 2020 and will hold off repeat at this time.  Did discuss low residue diet. If continues to loose weight consider imaging.   2.  Lower abdominal pain-likely irritable bowel.  Usually associated with mild constipation.  We discussed fiber fluid.  He can try dicyclomine as needed.  He last had a colonoscopy in [facility] several years ago and will schedule repeat at this time.  Recommended starting Benefiber daily for regularity.   Jarrette was seen today for follow-up.  Diagnoses and all orders for this visit:  Lower abdominal pain  Other orders -     metoCLOPramide (REGLAN) 5 MG tablet; Take 1 tablet (5 mg total) by mouth 4 (four) times daily -  before meals and at bedtime. -     pantoprazole (PROTONIX) 40 MG tablet; Take 1 tablet (40 mg total) by mouth 2 (two) times daily before a meal. -     dicyclomine (BENTYL) 10 MG  capsule; Take 1 capsule (10 mg total) by mouth 3 (three) times daily as needed (abd cramping). -     Wheat Dextrin (BENEFIBER) POWD; Take 1 Scoop by mouth daily.    Patient denies CP, SOB, and use of blood thinners. I discussed the risks and benefits of procedure including bleeding, perforation, infection, missed lesions, medication reactions and possible hospitalization or surgery if complications. All questions answered.  We will plan for propofol given comorbidities and other medications.  I personally performed the service, non-incident to. (WP)  Laurine Blazer, [facility]

## 2019-11-22 ENCOUNTER — Ambulatory Visit (INDEPENDENT_AMBULATORY_CARE_PROVIDER_SITE_OTHER): Payer: Medicaid Other | Admitting: Gastroenterology

## 2019-11-22 ENCOUNTER — Other Ambulatory Visit (INDEPENDENT_AMBULATORY_CARE_PROVIDER_SITE_OTHER): Payer: Self-pay | Admitting: *Deleted

## 2019-11-22 ENCOUNTER — Encounter (INDEPENDENT_AMBULATORY_CARE_PROVIDER_SITE_OTHER): Payer: Self-pay | Admitting: *Deleted

## 2019-11-22 ENCOUNTER — Encounter (INDEPENDENT_AMBULATORY_CARE_PROVIDER_SITE_OTHER): Payer: Self-pay | Admitting: Gastroenterology

## 2019-11-22 ENCOUNTER — Other Ambulatory Visit: Payer: Self-pay

## 2019-11-22 VITALS — BP 115/75 | HR 106 | Temp 98.5°F | Ht 68.0 in | Wt 164.6 lb

## 2019-11-22 DIAGNOSIS — R1030 Lower abdominal pain, unspecified: Secondary | ICD-10-CM

## 2019-11-22 MED ORDER — DICYCLOMINE HCL 10 MG PO CAPS
10.0000 mg | ORAL_CAPSULE | Freq: Three times a day (TID) | ORAL | 1 refills | Status: DC | PRN
Start: 2019-11-22 — End: 2019-12-24

## 2019-11-22 MED ORDER — METOCLOPRAMIDE HCL 5 MG PO TABS
5.0000 mg | ORAL_TABLET | Freq: Three times a day (TID) | ORAL | 1 refills | Status: DC
Start: 2019-11-22 — End: 2021-11-09

## 2019-11-22 MED ORDER — BENEFIBER PO POWD: 1.0000 | Freq: Every day | ORAL | 0 refills | Status: DC

## 2019-11-22 MED ORDER — PANTOPRAZOLE SODIUM 40 MG PO TBEC
40.0000 mg | DELAYED_RELEASE_TABLET | Freq: Two times a day (BID) | ORAL | 11 refills | Status: DC
Start: 2019-11-22 — End: 2020-07-30

## 2019-11-22 NOTE — Patient Instructions (Signed)
Restart protonix and reglan - reglan was increased to 4x/day. Notify me if any side effects  Can try dicyclomine as needed for abdominal pain and spasm   Try benefiber for bowel regularity

## 2019-12-10 ENCOUNTER — Other Ambulatory Visit: Payer: Self-pay | Admitting: Physician Assistant

## 2019-12-20 NOTE — Patient Instructions (Signed)
[name] [name]  12/20/2019     @PREFPERIOPPHARMACY @   Your procedure is scheduled on  12/24/2019.  Report to 12/26/2019 at  0725   A.M.  Call this number if you have problems the morning of surgery:  [phone]   Remember:  Follow the diet and prep instructions given to you by Dr [phone] office.                        Take these medicines the morning of surgery with A SIP OF WATER  Abilify, isosorbide, reglan, protonix, phenergan. Use your inhalers before you come. Take 1/2 of your Night time insulin the night before your procedure. DO NOT take any medications for diabetes the morning of your procedure.    Do not wear jewelry, make-up or nail polish.  Do not wear lotions, powders, or perfumes. Please wear deodorant and brush your teeth.  Do not shave 48 hours prior to surgery.  Men may shave face and neck.  Do not bring valuables to the hospital.  [facility] is not responsible for any belongings or valuables.  Contacts, dentures or bridgework may not be worn into surgery.  Leave your suitcase in the car.  After surgery it may be brought to your room.  For patients admitted to the hospital, discharge time will be determined by your treatment team.  Patients discharged the day of surgery will not be allowed to drive home.   Name and phone number of your driver:   family Special instructions:  DO NOT smoke the morning of your procedure.  Please read over the following fact sheets that you were given. Anesthesia Post-op Instructions and Care and Recovery After Surgery       Colonoscopy, Adult, Care After This sheet gives you information about how to care for yourself after your procedure. Your health care provider may also give you more specific instructions. If you have problems or questions, contact your health care provider. What can I expect after the procedure? After the procedure, it is common to have:  A small amount of blood in your stool for 24 hours  after the procedure.  Some gas.  Mild cramping or bloating of your abdomen. Follow these instructions at home: Eating and drinking   Drink enough fluid to keep your urine pale yellow.  Follow instructions from your health care provider about eating or drinking restrictions.  Resume your normal diet as instructed by your health care provider. Avoid heavy or fried foods that are hard to digest. Activity  Rest as told by your health care provider.  Avoid sitting for a long time without moving. Get up to take short walks every 1-2 hours. This is important to improve blood flow and breathing. Ask for help if you feel weak or unsteady.  Return to your normal activities as told by your health care provider. Ask your health care provider what activities are safe for you. Managing cramping and bloating   Try walking around when you have cramps or feel bloated.  Apply heat to your abdomen as told by your health care provider. Use the heat source that your health care provider recommends, such as a moist heat pack or a heating pad. ? Place a towel between your skin and the heat source. ? Leave the heat on for 20-30 minutes. ? Remove the heat if your skin turns bright red. This is especially important if you are unable to  feel pain, heat, or cold. You may have a greater risk of getting burned. General instructions  For the first 24 hours after the procedure: ? Do not drive or use machinery. ? Do not sign important documents. ? Do not drink alcohol. ? Do your regular daily activities at a slower pace than normal. ? Eat soft foods that are easy to digest.  Take over-the-counter and prescription medicines only as told by your health care provider.  Keep all follow-up visits as told by your health care provider. This is important. Contact a health care provider if:  You have blood in your stool 2-3 days after the procedure. Get help right away if you have:  More than a small spotting  of blood in your stool.  Large blood clots in your stool.  Swelling of your abdomen.  Nausea or vomiting.  A fever.  Increasing pain in your abdomen that is not relieved with medicine. Summary  After the procedure, it is common to have a small amount of blood in your stool. You may also have mild cramping and bloating of your abdomen.  For the first 24 hours after the procedure, do not drive or use machinery, sign important documents, or drink alcohol.  Get help right away if you have a lot of blood in your stool, nausea or vomiting, a fever, or increased pain in your abdomen. This information is not intended to replace advice given to you by your health care provider. Make sure you discuss any questions you have with your health care provider. Document Revised: 12/11/2018 Document Reviewed: 12/11/2018 Elsevier Patient Education  2020 [employer] Monitored Anesthesia Care, Care After These instructions provide you with information about caring for yourself after your procedure. Your health care provider may also give you more specific instructions. Your treatment has been planned according to current medical practices, but problems sometimes occur. Call your health care provider if you have any problems or questions after your procedure. What can I expect after the procedure? After your procedure, you may:  Feel sleepy for several hours.  Feel clumsy and have poor balance for several hours.  Feel forgetful about what happened after the procedure.  Have poor judgment for several hours.  Feel nauseous or vomit.  Have a sore throat if you had a breathing tube during the procedure. Follow these instructions at home: For at least 24 hours after the procedure:      Have a responsible adult stay with you. It is important to have someone help care for you until you are awake and alert.  Rest as needed.  Do not: ? Participate in activities in which you could fall or become  injured. ? Drive. ? Use heavy machinery. ? Drink alcohol. ? Take sleeping pills or medicines that cause drowsiness. ? Make important decisions or sign legal documents. ? Take care of children on your own. Eating and drinking  Follow the diet that is recommended by your health care provider.  If you vomit, drink water, juice, or soup when you can drink without vomiting.  Make sure you have little or no nausea before eating solid foods. General instructions  Take over-the-counter and prescription medicines only as told by your health care provider.  If you have sleep apnea, surgery and certain medicines can increase your risk for breathing problems. Follow instructions from your health care provider about wearing your sleep device: ? Anytime you are sleeping, including during daytime naps. ? While taking prescription pain medicines, sleeping medicines,  or medicines that make you drowsy.  If you smoke, do not smoke without supervision.  Keep all follow-up visits as told by your health care provider. This is important. Contact a health care provider if:  You keep feeling nauseous or you keep vomiting.  You feel light-headed.  You develop a rash.  You have a fever. Get help right away if:  You have trouble breathing. Summary  For several hours after your procedure, you may feel sleepy and have poor judgment.  Have a responsible adult stay with you for at least 24 hours or until you are awake and alert. This information is not intended to replace advice given to you by your health care provider. Make sure you discuss any questions you have with your health care provider. Document Revised: 08/15/2017 Document Reviewed: 09/07/2015 Elsevier Patient Education  [place].

## 2019-12-21 ENCOUNTER — Other Ambulatory Visit: Payer: Self-pay

## 2019-12-21 ENCOUNTER — Encounter (HOSPITAL_COMMUNITY): Payer: Self-pay

## 2019-12-21 ENCOUNTER — Other Ambulatory Visit (HOSPITAL_COMMUNITY)
Admission: RE | Admit: 2019-12-21 | Discharge: 2019-12-21 | Disposition: A | Discharge status: Home / Self Care | Payer: Self-pay | Source: Ambulatory Visit | Attending: Internal Medicine | Admitting: Internal Medicine

## 2019-12-21 ENCOUNTER — Encounter (HOSPITAL_COMMUNITY)
Admission: RE | Admit: 2019-12-21 | Discharge: 2019-12-21 | Disposition: A | Discharge status: Home / Self Care | Payer: Self-pay | Source: Ambulatory Visit | Attending: Internal Medicine | Admitting: Internal Medicine

## 2019-12-21 DIAGNOSIS — R1030 Lower abdominal pain, unspecified: Secondary | ICD-10-CM

## 2019-12-21 DIAGNOSIS — Z01818 Encounter for other preprocedural examination: Secondary | ICD-10-CM | POA: Insufficient documentation

## 2019-12-21 DIAGNOSIS — Z20822 Contact with and (suspected) exposure to covid-19: Secondary | ICD-10-CM | POA: Insufficient documentation

## 2019-12-21 LAB — BASIC METABOLIC PANEL
Anion gap: 11 (ref 5–15)
BUN: 20 mg/dL (ref 6–20)
CO2: 23 mmol/L (ref 22–32)
Calcium: 8.8 mg/dL — ABNORMAL LOW (ref 8.9–10.3)
Chloride: 102 mmol/L (ref 98–111)
Creatinine, Ser: 1.12 mg/dL (ref 0.61–1.24)
GFR calc Af Amer: >60 mL/min (ref >60)
GFR calc non Af Amer: >60 mL/min (ref >60)
Glucose, Bld: 353 mg/dL — ABNORMAL HIGH (ref 70–99)
Potassium: 4.4 mmol/L (ref 3.5–5.1)
Sodium: 136 mmol/L (ref 135–145)

## 2019-12-21 LAB — SARS CORONAVIRUS 2 (TAT 6-24 HRS): SARS Coronavirus 2: NEGATIVE

## 2019-12-21 NOTE — Progress Notes (Signed)
Lab results from today-blood sugar 353. Dr Karilyn Cota aware. Dr Alva Garnet aware. Dr Alva Garnet instructed for pt to take full dose of insulin evening before procedure. Attempted to contact pt by phone. No answer. Message left for pt to take full dose of insulin evening before procedure.

## 2019-12-24 ENCOUNTER — Ambulatory Visit (HOSPITAL_COMMUNITY)
Admission: RE | Admit: 2019-12-24 | Discharge: 2019-12-24 | Disposition: A | Discharge status: Home / Self Care | Payer: Self-pay | Attending: Internal Medicine | Admitting: Internal Medicine

## 2019-12-24 ENCOUNTER — Ambulatory Visit (HOSPITAL_COMMUNITY): Payer: Self-pay | Admitting: Anesthesiology

## 2019-12-24 ENCOUNTER — Encounter (HOSPITAL_COMMUNITY)
Admission: RE | Disposition: A | Discharge status: Home / Self Care | Payer: Self-pay | Source: Home / Self Care | Attending: Internal Medicine

## 2019-12-24 DIAGNOSIS — E1142 Type 2 diabetes mellitus with diabetic polyneuropathy: Secondary | ICD-10-CM | POA: Insufficient documentation

## 2019-12-24 DIAGNOSIS — G4730 Sleep apnea, unspecified: Secondary | ICD-10-CM | POA: Insufficient documentation

## 2019-12-24 DIAGNOSIS — R1030 Lower abdominal pain, unspecified: Secondary | ICD-10-CM

## 2019-12-24 DIAGNOSIS — Z794 Long term (current) use of insulin: Secondary | ICD-10-CM | POA: Insufficient documentation

## 2019-12-24 DIAGNOSIS — Z7982 Long term (current) use of aspirin: Secondary | ICD-10-CM | POA: Insufficient documentation

## 2019-12-24 DIAGNOSIS — K219 Gastro-esophageal reflux disease without esophagitis: Secondary | ICD-10-CM | POA: Insufficient documentation

## 2019-12-24 DIAGNOSIS — K644 Residual hemorrhoidal skin tags: Secondary | ICD-10-CM | POA: Insufficient documentation

## 2019-12-24 DIAGNOSIS — E1165 Type 2 diabetes mellitus with hyperglycemia: Secondary | ICD-10-CM | POA: Insufficient documentation

## 2019-12-24 DIAGNOSIS — I10 Essential (primary) hypertension: Secondary | ICD-10-CM | POA: Insufficient documentation

## 2019-12-24 DIAGNOSIS — I2510 Atherosclerotic heart disease of native coronary artery without angina pectoris: Secondary | ICD-10-CM | POA: Insufficient documentation

## 2019-12-24 DIAGNOSIS — E785 Hyperlipidemia, unspecified: Secondary | ICD-10-CM | POA: Insufficient documentation

## 2019-12-24 DIAGNOSIS — Z7951 Long term (current) use of inhaled steroids: Secondary | ICD-10-CM | POA: Insufficient documentation

## 2019-12-24 DIAGNOSIS — Z79899 Other long term (current) drug therapy: Secondary | ICD-10-CM | POA: Insufficient documentation

## 2019-12-24 DIAGNOSIS — F329 Major depressive disorder, single episode, unspecified: Secondary | ICD-10-CM | POA: Insufficient documentation

## 2019-12-24 DIAGNOSIS — Z1211 Encounter for screening for malignant neoplasm of colon: Secondary | ICD-10-CM

## 2019-12-24 DIAGNOSIS — J449 Chronic obstructive pulmonary disease, unspecified: Secondary | ICD-10-CM | POA: Insufficient documentation

## 2019-12-24 DIAGNOSIS — F419 Anxiety disorder, unspecified: Secondary | ICD-10-CM | POA: Insufficient documentation

## 2019-12-24 DIAGNOSIS — F17210 Nicotine dependence, cigarettes, uncomplicated: Secondary | ICD-10-CM | POA: Insufficient documentation

## 2019-12-24 HISTORY — PX: COLONOSCOPY WITH PROPOFOL: SHX5780

## 2019-12-24 LAB — GLUCOSE, CAPILLARY
Glucose-Capillary: 213 mg/dL — ABNORMAL HIGH (ref 70–99)
Glucose-Capillary: 211 mg/dL — ABNORMAL HIGH (ref 70–99)

## 2019-12-24 SURGERY — COLONOSCOPY WITH PROPOFOL
Anesthesia: General

## 2019-12-24 MED ORDER — PROPOFOL 10 MG/ML IV BOLUS
INTRAVENOUS | Status: DC | PRN
  Administered 2019-12-24: 50 mg via INTRAVENOUS

## 2019-12-24 MED ORDER — LACTATED RINGERS IV SOLN
INTRAVENOUS | Status: DC | PRN
Start: 2019-12-24 — End: 2019-12-24

## 2019-12-24 MED ORDER — LACTATED RINGERS IV SOLN: Freq: Once | INTRAVENOUS | Status: AC

## 2019-12-24 MED ORDER — PROPOFOL 500 MG/50ML IV EMUL
INTRAVENOUS | Status: DC | PRN
  Administered 2019-12-24: 150 ug/kg/min via INTRAVENOUS

## 2019-12-24 NOTE — Transfer of Care (Signed)
Immediate Anesthesia Transfer of Care Note  Patient: [name] [name]  Procedure(s) Performed: COLONOSCOPY WITH PROPOFOL (N/A )  Patient Location: PACU  Anesthesia Type:General  Level of Consciousness: awake, alert , oriented and patient cooperative  Airway & Oxygen Therapy: Patient Spontanous Breathing  Post-op Assessment: Report given to RN and Post -op Vital signs reviewed and stable  Post vital signs: Reviewed and stable  Last Vitals:  Vitals Value Taken Time  BP 81/60 12/24/19 0926  Temp    Pulse 91 12/24/19 0928  Resp 14 12/24/19 0928  SpO2 93 % 12/24/19 0928  Vitals shown include unvalidated device data.  Last Pain:  Vitals:   12/24/19 0900  PainSc: 0-No pain         Complications: No complications documented.

## 2019-12-24 NOTE — Anesthesia Postprocedure Evaluation (Signed)
Anesthesia Post Note  Patient: [name] [name]  Procedure(s) Performed: COLONOSCOPY WITH PROPOFOL (N/A )  Patient location during evaluation: PACU Anesthesia Type: General Level of consciousness: awake, oriented, awake and alert and patient cooperative Pain management: satisfactory to patient Vital Signs Assessment: post-procedure vital signs reviewed and stable Respiratory status: spontaneous breathing, respiratory function stable and nonlabored ventilation Cardiovascular status: stable Postop Assessment: no apparent nausea or vomiting Anesthetic complications: no   No complications documented.   Last Vitals:  Vitals:   12/24/19 0810 12/24/19 0925  BP: (!) 137/89   Pulse: 96 88  Resp: (!) 27   Temp: 36.7 C (P) 36.7 C  SpO2: 96%     Last Pain:  Vitals:   12/24/19 0900  PainSc: 0-No pain                 Cheray Pardi

## 2019-12-24 NOTE — Discharge Instructions (Signed)
Resume usual medications and diet as before. No driving for 24 hours. Next screening exam in 10 years.   Colonoscopy, Adult, Care After This sheet gives you information about how to care for yourself after your procedure. Your doctor may also give you more specific instructions. If you have problems or questions, call your doctor. What can I expect after the procedure? After the procedure, it is common to have:  A small amount of blood in your poop (stool) for 24 hours.  Some gas.  Mild cramping or bloating in your belly (abdomen). Follow these instructions at home: Eating and drinking   Drink enough fluid to keep your pee (urine) pale yellow.  Follow instructions from your doctor about what you cannot eat or drink.  Return to your normal diet as told by your doctor. Avoid heavy or fried foods that are hard to digest. Activity  Rest as told by your doctor.  Do not sit for a long time without moving. Get up to take short walks every 1-2 hours. This is important. Ask for help if you feel weak or unsteady.  Return to your normal activities as told by your doctor. Ask your doctor what activities are safe for you. To help cramping and bloating:   Try walking around.  Put heat on your belly as told by your doctor. Use the heat source that your doctor recommends, such as a moist heat pack or a heating pad. ? Put a towel between your skin and the heat source. ? Leave the heat on for 20-30 minutes. ? Remove the heat if your skin turns bright red. This is very important if you are unable to feel pain, heat, or cold. You may have a greater risk of getting burned. General instructions  For the first 24 hours after the procedure: ? Do not drive or use machinery. ? Do not sign important documents. ? Do not drink alcohol. ? Do your daily activities more slowly than normal. ? Eat foods that are soft and easy to digest.  Take over-the-counter or prescription medicines only as told by  your doctor.  Keep all follow-up visits as told by your doctor. This is important. Contact a doctor if:  You have blood in your poop 2-3 days after the procedure. Get help right away if:  You have more than a small amount of blood in your poop.  You see large clumps of tissue (blood clots) in your poop.  Your belly is swollen.  You feel like you may vomit (nauseous).  You vomit.  You have a fever.  You have belly pain that gets worse, and medicine does not help your pain. Summary  After the procedure, it is common to have a small amount of blood in your poop. You may also have mild cramping and bloating in your belly.  For the first 24 hours after the procedure, do not drive or use machinery, do not sign important documents, and do not drink alcohol.  Get help right away if you have a lot of blood in your poop, feel like you may vomit, have a fever, or have more belly pain. This information is not intended to replace advice given to you by your health care provider. Make sure you discuss any questions you have with your health care provider. Document Revised: 12/11/2018 Document Reviewed: 12/11/2018 Elsevier Patient Education  2020 [employer]   Monitored Anesthesia Care, Care After These instructions provide you with information about caring for yourself after your procedure.  Your health care provider may also give you more specific instructions. Your treatment has been planned according to current medical practices, but problems sometimes occur. Call your health care provider if you have any problems or questions after your procedure. What can I expect after the procedure? After your procedure, you may:  Feel sleepy for several hours.  Feel clumsy and have poor balance for several hours.  Feel forgetful about what happened after the procedure.  Have poor judgment for several hours.  Feel nauseous or vomit.  Have a sore throat if you had a breathing tube during the  procedure. Follow these instructions at home: For at least 24 hours after the procedure:      Have a responsible adult stay with you. It is important to have someone help care for you until you are awake and alert.  Rest as needed.  Do not: ? Participate in activities in which you could fall or become injured. ? Drive. ? Use heavy machinery. ? Drink alcohol. ? Take sleeping pills or medicines that cause drowsiness. ? Make important decisions or sign legal documents. ? Take care of children on your own. Eating and drinking  Follow the diet that is recommended by your health care provider.  If you vomit, drink water, juice, or soup when you can drink without vomiting.  Make sure you have little or no nausea before eating solid foods. General instructions  Take over-the-counter and prescription medicines only as told by your health care provider.  If you have sleep apnea, surgery and certain medicines can increase your risk for breathing problems. Follow instructions from your health care provider about wearing your sleep device: ? Anytime you are sleeping, including during daytime naps. ? While taking prescription pain medicines, sleeping medicines, or medicines that make you drowsy.  If you smoke, do not smoke without supervision.  Keep all follow-up visits as told by your health care provider. This is important. Contact a health care provider if:  You keep feeling nauseous or you keep vomiting.  You feel light-headed.  You develop a rash.  You have a fever. Get help right away if:  You have trouble breathing. Summary  For several hours after your procedure, you may feel sleepy and have poor judgment.  Have a responsible adult stay with you for at least 24 hours or until you are awake and alert. This information is not intended to replace advice given to you by your health care provider. Make sure you discuss any questions you have with your health care  provider. Document Revised: 08/15/2017 Document Reviewed: 09/07/2015 Elsevier Patient Education  2020 [employer].

## 2019-12-24 NOTE — H&P (Signed)
[name] [name] is an 49 y.o. male.   Chief Complaint: Patient is here for colonoscopy. HPI: Patient 49 year old Caucasian male with multiple medical problems including diabetes mellitus who is here for screening colonoscopy.  He denies rectal bleeding.  His bowels are regular.  He could have constipation and/or diarrhea.  No history of weight loss.  He also complains of intermittent lower abdominal pain which is felt to be either due to IBS or autonomic neuropathy. Family history is negative for CRC.  Past Medical History:  Diagnosis Date  . Anxiety   . Arthritis   . CAD (coronary artery disease)    Moderate LAD disease 2016 - Dr. Jacinto Halim  . Chronic bronchitis (HCC)   . Chronic upper back pain   . COPD (chronic obstructive pulmonary disease) (HCC)   . Depression   . Diabetic peripheral neuropathy (HCC)   . GERD (gastroesophageal reflux disease)   . History of gout   . Hyperlipemia   . Hypertension   . Migraine   . Noncompliance   . 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 Sleep apnea 2016   "could not afford CPAP".  . Type 2 diabetes mellitus (HCC)     Past Surgical History:  Procedure Laterality Date  . ANKLE SURGERY Right 1982   "had extra bones in there; took them out"  . APPENDECTOMY  1975  . BIOPSY  12/26/2018   Procedure: BIOPSY;  Surgeon: Malissa Hippo, MD;  Location: [facility];  Service: Endoscopy;;  duodenal biopsies  . BIOPSY  03/23/2019   Procedure: BIOPSY;  Surgeon: Malissa Hippo, MD;  Location: [facility];  Service: Endoscopy;;  esophagus  . CARDIAC CATHETERIZATION N/A 03/28/2015   Procedure: Left Heart Cath and Coronary Angiography;  Surgeon: Yates Decamp, MD;  Location[facility] INVASIVE CV LAB;  Service: Cardiovascular;  Laterality: N/A;  . CARDIAC CATHETERIZATION N/A 03/28/2015   Procedure: Intravascular Pressure Wire/FFR Study;  Surgeon: Yates Decamp, MD;  Location: [facility] INVASIVE CV LAB;  Service: Cardiovascular;  Laterality: N/A;  . CARPAL TUNNEL RELEASE Left ~ 2008   . COLONOSCOPY WITH ESOPHAGOGASTRODUODENOSCOPY (EGD)    . ELBOW FRACTURE SURGERY Left ~ 2008  . ESOPHAGOGASTRODUODENOSCOPY N/A 12/26/2018   Procedure: ESOPHAGOGASTRODUODENOSCOPY (EGD);  Surgeon: Malissa Hippo, MD;  Location: [facility];  Service: Endoscopy;  Laterality: N/A;  . ESOPHAGOGASTRODUODENOSCOPY (EGD) WITH PROPOFOL N/A 03/23/2019   Procedure: ESOPHAGOGASTRODUODENOSCOPY (EGD) WITH PROPOFOL;  Surgeon: Malissa Hippo, MD;  Location: [facility];  Service: Endoscopy;  Laterality: N/A;  7:30  . FRACTURE SURGERY     ankle and elbow  . PILONIDAL CYST EXCISION N/A 03/24/2017   Procedure: EXCISION CHRONIC  PILONIDAL ABSCESS;  Surgeon: [name] Miyamoto, MD;  Location: [facility];  Service: General;  Laterality: N/A;  . TENDON REPAIR Left ~ 2004   "main tendon in my ankle"    Family History  Problem Relation Age of Onset  . Congestive Heart Failure Sister   . Diabetes Mother   . Hypertension Mother   . Diabetes Father   . Hypertension Father    Social History:  reports that he has been smoking cigarettes. He has a 8.50 pack-year smoking history. He has never used smokeless tobacco. He reports that he does not drink alcohol and does not use drugs.  Allergies:  Allergies  Allergen Reactions  . Omeprazole Magnesium Swelling    Face swells, no breathing impairment  . Esomeprazole Swelling    Face swells, no breathing impairment  Medications Prior to Admission  Medication Sig Dispense Refill  . acetaminophen (TYLENOL) 325 MG tablet Take 2 tablets (650 mg total) by mouth every 6 (six) hours as needed for moderate pain. (Patient taking differently: Take 650-975 mg by mouth every 6 (six) hours as needed for moderate pain. )    . ARIPiprazole (ABILIFY) 5 MG tablet Take 5 mg by mouth daily.    Marland Kitchen aspirin 81 MG tablet Take 1 tablet (81 mg total) by mouth at bedtime. 30 tablet   . atorvastatin (LIPITOR) 80 MG tablet TAKE 1 Tablet BY MOUTH ONCE EVERY DAY (Patient taking differently:  Take 80 mg by mouth daily. ) 90 tablet 0  . Carboxymethylcellul-Glycerin (LUBRICATING EYE DROPS OP) Place 1 drop into both eyes daily as needed (itching eyes).    . DULERA 100-5 MCG/ACT AERO INHALE 2 PUFFS BY MOUTH TWICE DAILY. RINSE MOUTH AFTER EACH USE (Patient taking differently: Inhale 2 puffs into the lungs 2 (two) times daily. ) 13 g 0  . Fluticasone-Salmeterol (ADVAIR) 100-50 MCG/DOSE AEPB INHALE 1 PUFF BY MOUTH TWICE DAILY. RINSE MOUTH AFTER EACH USE. (Patient taking differently: Inhale 1 puff into the lungs 2 (two) times daily. ) 60 each 0  . ibuprofen (ADVIL) 200 MG tablet Take 400-600 mg by mouth every 6 (six) hours as needed for moderate pain.    Marland Kitchen insulin aspart (NOVOLOG) 100 UNIT/ML injection Before each meal 3 times a day, 140-199 - 2 units, 200-250 - 4 units, 251-299 - 6 units,  300-349 - 8 units,  350 or above 10 units. Dispense syringes and needles as needed, Ok to switch to PEN if approved. Substitute to any brand approved. DX DM2, Code E11.65 (Patient taking differently: Inject 2-4 Units into the skin 3 (three) times daily with meals. ) 1 vial 0  . insulin glargine (LANTUS SOLOSTAR) 100 UNIT/ML Solostar Pen Inject 45 Units into the skin at bedtime.    . isosorbide mononitrate (IMDUR) 30 MG 24 hr tablet Take 1 tablet (30 mg total) by mouth daily. 90 tablet 3  . metFORMIN (GLUCOPHAGE) 1000 MG tablet TAKE 1 Tablet  BY MOUTH TWICE DAILY WITH A MEAL (Patient taking differently: Take 1,000 mg by mouth 2 (two) times daily with a meal. ) 180 tablet 0  . nitroGLYCERIN (NITROSTAT) 0.4 MG SL tablet Place 1 tablet (0.4 mg total) under the tongue every 5 (five) minutes as needed for chest pain. 25 tablet 3  . PROVENTIL HFA 108 (90 Base) MCG/ACT inhaler INHALE 2 PUFFS BY MOUTH EVERY 6 HOURS AS NEEDED FOR COUGHING, WHEEZING, OR SHORTNESS OF BREATH (Patient taking differently: Inhale 2 puffs into the lungs every 6 (six) hours as needed for wheezing or shortness of breath. ) 6.7 g 0  . sertraline  (ZOLOFT) 50 MG tablet Take 50 mg by mouth at bedtime.    . dicyclomine (BENTYL) 10 MG capsule Take 1 capsule (10 mg total) by mouth 3 (three) times daily as needed (abd cramping). (Patient not taking: Reported on 12/17/2019) 90 capsule 1  . metoCLOPramide (REGLAN) 5 MG tablet Take 1 tablet (5 mg total) by mouth 4 (four) times daily -  before meals and at bedtime. (Patient taking differently: Take 5 mg by mouth 2 (two) times daily as needed for nausea or vomiting. ) 120 tablet 1  . pantoprazole (PROTONIX) 40 MG tablet Take 1 tablet (40 mg total) by mouth 2 (two) times daily before a meal. 60 tablet 11  . promethazine (PHENERGAN) 25 MG suppository Place 1 suppository (  25 mg total) rectally every 8 (eight) hours as needed for refractory nausea / vomiting. 12 each 0  . promethazine (PHENERGAN) 25 MG tablet Take 25 mg by mouth every 8 (eight) hours as needed for nausea or vomiting.    . Wheat Dextrin (BENEFIBER) POWD Take 1 Scoop by mouth daily. (Patient not taking: Reported on 12/17/2019) 500 g 0    Results for orders placed or performed during the hospital encounter of 12/24/19 (from the past 48 hour(s))  Glucose, capillary     Status: Abnormal   Collection Time: 12/24/19  7:49 AM  Result Value Ref Range   Glucose-Capillary 213 (H) 70 - 99 mg/dL    Comment: Glucose reference range applies only to samples taken after fasting for at least 8 hours.   No results found.  Review of Systems  Blood pressure (!) 137/89, pulse 96, temperature 98 F (36.7 C), resp. rate (!) 27, SpO2 96 %. Physical Exam HENT:     Mouth/Throat:     Mouth: Mucous membranes are moist.     Pharynx: Oropharynx is clear.  Eyes:     General: No scleral icterus.    Conjunctiva/sclera: Conjunctivae normal.  Cardiovascular:     Rate and Rhythm: Normal rate and regular rhythm.     Heart sounds: Normal heart sounds. No murmur heard.   Pulmonary:     Effort: Pulmonary effort is normal.     Breath sounds: Normal breath sounds.   Abdominal:     Comments: Abdomen is symmetrical.  He has appendectomy scar in right lower quadrant.  Abdomen is soft and nontender with no organomegaly or masses.  Musculoskeletal:        General: No swelling.     Cervical back: Neck supple.  Lymphadenopathy:     Cervical: No cervical adenopathy.  Skin:    General: Skin is warm and dry.  Neurological:     Mental Status: He is alert.  Psychiatric:        Mood and Affect: Mood normal.      Assessment/Plan  Average risk screening colonoscopy.  Lionel December, MD 12/24/2019, 8:57 AM

## 2019-12-24 NOTE — Anesthesia Preprocedure Evaluation (Signed)
Anesthesia Evaluation  Patient identified by MRN, date of birth, ID band Patient awake    Reviewed: Allergy & Precautions, NPO status , Patient's Chart, lab work & pertinent test results  History of Anesthesia Complications Negative for: history of anesthetic complications  Airway Mallampati: II  TM Distance: >3 FB Neck ROM: Full    Dental  (+) Edentulous Upper, Edentulous Lower   Pulmonary sleep apnea , COPD,  COPD inhaler, Current Smoker and Patient abstained from smoking.,    Pulmonary exam normal breath sounds clear to auscultation       Cardiovascular Exercise Tolerance: Poor hypertension, Pt. on medications + CAD  Normal cardiovascular exam Rhythm:Regular Rate:Normal  2019- stress test-  There was no ST segment deviation noted during stress.  Defect 1: There is a medium defect of mild severity present in the mid anteroseptal, mid inferoseptal, mid inferior, apical septal and apical inferior location. This is likely due to soft tissue attenuation.  This is a low risk study. No ischemic zones.  Nuclear stress EF: 63%.   Neuro/Psych  Headaches, PSYCHIATRIC DISORDERS Anxiety Depression  Neuromuscular disease    GI/Hepatic PUD, GERD  Medicated and Controlled,  Endo/Other  diabetes, Poorly Controlled, Type 2, Insulin Dependent  Renal/GU Renal InsufficiencyRenal disease     Musculoskeletal  (+) Arthritis , Osteoarthritis,    Abdominal   Peds  Hematology   Anesthesia Other Findings   Reproductive/Obstetrics                            Anesthesia Physical Anesthesia Plan  ASA: IV  Anesthesia Plan: General   Post-op Pain Management:    Induction: Intravenous  PONV Risk Score and Plan: 1  Airway Management Planned: Nasal Cannula, Natural Airway and Simple Face Mask  Additional Equipment:   Intra-op Plan:   Post-operative Plan:   Informed Consent: I have reviewed the patients  History and Physical, chart, labs and discussed the procedure including the risks, benefits and alternatives for the proposed anesthesia with the patient or authorized representative who has indicated his/her understanding and acceptance.       Plan Discussed with: CRNA and Surgeon  Anesthesia Plan Comments:        Anesthesia Quick Evaluation

## 2019-12-24 NOTE — Op Note (Signed)
[facility] Patient Name: [name] [name] Procedure Date: 12/24/2019 8:45 AM MRN: [id number] Date of Birth: July 14, 1970 Attending MD: Lionel December , MD CSN: [id number] Age: 49 Admit Type: Outpatient Procedure:                Colonoscopy Indications:              Screening for colorectal malignant neoplasm Providers:                Lionel December, MD, Loma Messing B. Patsy Lager, RN, Crystal                            Page, Edythe Clarity, Technician Referring MD:             [name], PA-C Medicines:                Propofol per Anesthesia Complications:            No immediate complications. Estimated Blood Loss:     Estimated blood loss: none. Procedure:                Pre-Anesthesia Assessment:                           - Prior to the procedure, a History and Physical                            was performed, and patient medications and                            allergies were reviewed. The patient's tolerance of                            previous anesthesia was also reviewed. The risks                            and benefits of the procedure and the sedation                            options and risks were discussed with the patient.                            All questions were answered, and informed consent                            was obtained. Prior Anticoagulants: The patient has                            taken no previous anticoagulant or antiplatelet                            agents except for aspirin. ASA Grade Assessment:                            III - A patient with severe systemic disease. After  reviewing the risks and benefits, the patient was                            deemed in satisfactory condition to undergo the                            procedure.                           After obtaining informed consent, the colonoscope                            was passed under direct vision. Throughout the                            procedure,  the patient's blood pressure, pulse, and                            oxygen saturations were monitored continuously. The                            PCF-[id number] ([id number]) was introduced through the                            anus and advanced to the the cecum, identified by                            appendiceal orifice and ileocecal valve. The                            colonoscopy was performed without difficulty. The                            patient tolerated the procedure well. The quality                            of the bowel preparation was good. The ileocecal                            valve, appendiceal orifice, and rectum were                            photographed. Scope In: 9:06:33 AM Scope Out: 9:21:31 AM Scope Withdrawal Time: 0 hours 8 minutes 46 seconds  Total Procedure Duration: 0 hours 14 minutes 58 seconds  Findings:      The digital rectal exam findings include decreased sphincter tone.      The colon (entire examined portion) appeared normal.      External hemorrhoids were found during retroflexion. The hemorrhoids       were small. Impression:               - Decreased sphincter tone found on digital rectal                            exam.                           -  The entire examined colon is normal.                           - External hemorrhoids.                           - No specimens collected. Moderate Sedation:      Per Anesthesia Care Recommendation:           - Patient has a contact number available for                            emergencies. The signs and symptoms of potential                            delayed complications were discussed with the                            patient. Return to normal activities tomorrow.                            Written discharge instructions were provided to the                            patient.                           - Resume previous diet today.                           - Continue present medications.                            - Resume Aspirin at prior dose today.                           - Repeat colonoscopy in 10 years for screening                            purposes. Procedure Code(s):        --- Professional ---                           [phone], Colonoscopy, flexible; diagnostic, including                            collection of specimen(s) by brushing or washing,                            when performed (separate procedure) Diagnosis Code(s):        --- Professional ---                           Z12.11, Encounter for screening for malignant                            neoplasm of colon  K62.89, Other specified diseases of anus and rectum                           K64.4, Residual hemorrhoidal skin tags CPT copyright 2019 [employer]. All rights reserved. The codes documented in this report are preliminary and upon coder review may  be revised to meet current compliance requirements. Lionel December, MD Lionel December, MD 12/24/2019 9:29:03 AM This report has been signed electronically. Number of Addenda: 0

## 2019-12-26 ENCOUNTER — Encounter (HOSPITAL_COMMUNITY): Payer: Self-pay | Admitting: Internal Medicine

## 2020-01-11 ENCOUNTER — Other Ambulatory Visit: Payer: Self-pay

## 2020-01-11 ENCOUNTER — Other Ambulatory Visit (HOSPITAL_COMMUNITY)
Admission: RE | Admit: 2020-01-11 | Discharge: 2020-01-11 | Disposition: A | Discharge status: Home / Self Care | Payer: Self-pay | Source: Ambulatory Visit | Attending: Physician Assistant | Admitting: Physician Assistant

## 2020-01-11 DIAGNOSIS — E1165 Type 2 diabetes mellitus with hyperglycemia: Secondary | ICD-10-CM | POA: Insufficient documentation

## 2020-01-11 LAB — LIPID PANEL
Cholesterol: 193 mg/dL (ref 0–200)
HDL: 41 mg/dL (ref >40)
LDL Cholesterol: 120 mg/dL — ABNORMAL HIGH (ref 0–99)
Total CHOL/HDL Ratio: 4.7 RATIO
Triglycerides: 162 mg/dL — ABNORMAL HIGH (ref <150)
VLDL: 32 mg/dL (ref 0–40)

## 2020-01-11 LAB — COMPREHENSIVE METABOLIC PANEL
ALT: 18 U/L (ref 0–44)
AST: 16 U/L (ref 15–41)
Albumin: 3.7 g/dL (ref 3.5–5.0)
Alkaline Phosphatase: 92 U/L (ref 38–126)
Anion gap: 10 (ref 5–15)
BUN: 23 mg/dL — ABNORMAL HIGH (ref 6–20)
CO2: 22 mmol/L (ref 22–32)
Calcium: 8.9 mg/dL (ref 8.9–10.3)
Chloride: 101 mmol/L (ref 98–111)
Creatinine, Ser: 1.06 mg/dL (ref 0.61–1.24)
GFR calc Af Amer: >60 mL/min (ref >60)
GFR calc non Af Amer: >60 mL/min (ref >60)
Glucose, Bld: 318 mg/dL — ABNORMAL HIGH (ref 70–99)
Potassium: 4.4 mmol/L (ref 3.5–5.1)
Sodium: 133 mmol/L — ABNORMAL LOW (ref 135–145)
Total Bilirubin: 0.4 mg/dL (ref 0.3–1.2)
Total Protein: 7.3 g/dL (ref 6.5–8.1)

## 2020-01-11 LAB — HEMOGLOBIN A1C
Hgb A1c MFr Bld: 14.6 % — ABNORMAL HIGH (ref 4.8–5.6)
Mean Plasma Glucose: 372.32 mg/dL

## 2020-01-14 ENCOUNTER — Encounter: Payer: Self-pay | Admitting: Physician Assistant

## 2020-01-14 ENCOUNTER — Ambulatory Visit: Payer: Self-pay | Admitting: Physician Assistant

## 2020-01-14 VITALS — BP 90/60 | HR 109 | Temp 97.5°F | Ht 68.0 in | Wt 171.4 lb

## 2020-01-14 DIAGNOSIS — J449 Chronic obstructive pulmonary disease, unspecified: Secondary | ICD-10-CM

## 2020-01-14 DIAGNOSIS — F17200 Nicotine dependence, unspecified, uncomplicated: Secondary | ICD-10-CM

## 2020-01-14 DIAGNOSIS — I2510 Atherosclerotic heart disease of native coronary artery without angina pectoris: Secondary | ICD-10-CM

## 2020-01-14 DIAGNOSIS — F39 Unspecified mood [affective] disorder: Secondary | ICD-10-CM

## 2020-01-14 DIAGNOSIS — E1165 Type 2 diabetes mellitus with hyperglycemia: Secondary | ICD-10-CM

## 2020-01-14 DIAGNOSIS — E785 Hyperlipidemia, unspecified: Secondary | ICD-10-CM

## 2020-01-14 NOTE — Progress Notes (Signed)
BP 90/60   Pulse (!) 109   Temp (!) 97.5 F (36.4 C)   Ht 5\' 8"  (1.727 m)   Wt 171 lb 6.4 oz (77.7 kg)   SpO2 97%   BMI 26.06 kg/m    Subjective:    Patient ID: , male    DOB: 11/13/1970, 49 y.o.   MRN: [id number]  HPI: [name] [name] is a 49 y.o. male presenting on 01/14/2020 for Diabetes, Hypertension, and Hyperlipidemia   HPI   Pt had a negative covid 19 screening questionnaire.   Pt is 49yoM with DM, CAD, dyslipidemia COPD, MH issues with routine follow-up today.   He says he is doing well and has no complaints.  He is using 45units lantus and ssi 2-10units tid.  This is questionable because he gets his insulin shipped to clinic and he is picking up today insulin that was received in 01/2019.  He has bs log with numbers all over- mostly in the 100s and 200s.  Pt is Still going to [facility].     Relevant past medical, surgical, family and social history reviewed and updated as indicated. Interim medical history since our last visit reviewed. Allergies and medications reviewed and updated.   Current Outpatient Medications:  .  acetaminophen (TYLENOL) 325 MG tablet, Take 2 tablets (650 mg total) by mouth every 6 (six) hours as needed for moderate pain. (Patient taking differently: Take 650-975 mg by mouth every 6 (six) hours as needed for moderate pain. ), Disp: , Rfl:  .  ARIPiprazole (ABILIFY) 5 MG tablet, Take 5 mg by mouth daily., Disp: , Rfl:  .  aspirin 81 MG tablet, Take 1 tablet (81 mg total) by mouth at bedtime., Disp: 30 tablet, Rfl:  .  atorvastatin (LIPITOR) 80 MG tablet, TAKE 1 Tablet BY MOUTH ONCE EVERY DAY, Disp: 90 tablet, Rfl: 0 .  Carboxymethylcellul-Glycerin (LUBRICATING EYE DROPS OP), Place 1 drop into both eyes daily as needed (itching eyes)., Disp: , Rfl:  .  DULERA 100-5 MCG/ACT AERO, INHALE 2 PUFFS BY MOUTH TWICE DAILY. RINSE MOUTH AFTER EACH USE (Patient taking differently: Inhale 2 puffs into the lungs 2 (two) times daily. ), Disp: 13  g, Rfl: 0 .  ibuprofen (ADVIL) 200 MG tablet, Take 400-600 mg by mouth every 6 (six) hours as needed for moderate pain., Disp: , Rfl:  .  insulin aspart (NOVOLOG) 100 UNIT/ML injection, Before each meal 3 times a day, 140-199 - 2 units, 200-250 - 4 units, 251-299 - 6 units,  300-349 - 8 units,  350 or above 10 units. Dispense syringes and needles as needed, Ok to switch to PEN if approved. Substitute to any brand approved. DX DM2, Code E11.65 (Patient taking differently: Inject 2-10 Units into the skin 3 (three) times daily with meals. ), Disp: 1 vial, Rfl: 0 .  insulin glargine (LANTUS SOLOSTAR) 100 UNIT/ML Solostar Pen, Inject 45 Units into the skin at bedtime., Disp: , Rfl:  .  metFORMIN (GLUCOPHAGE) 1000 MG tablet, TAKE 1 Tablet  BY MOUTH TWICE DAILY WITH A MEAL (Patient taking differently: Take 1,000 mg by mouth 2 (two) times daily with a meal. ), Disp: 180 tablet, Rfl: 0 .  metoCLOPramide (REGLAN) 5 MG tablet, Take 1 tablet (5 mg total) by mouth 4 (four) times daily -  before meals and at bedtime. (Patient taking differently: Take 5 mg by mouth 2 (two) times daily as needed for nausea or vomiting. ), Disp: 120 tablet, Rfl: 1 .  nitroGLYCERIN (NITROSTAT) 0.4 MG SL tablet, Place 1 tablet (0.4 mg total) under the tongue every 5 (five) minutes as needed for chest pain., Disp: 25 tablet, Rfl: 3 .  promethazine (PHENERGAN) 25 MG suppository, Place 1 suppository (25 mg total) rectally every 8 (eight) hours as needed for refractory nausea / vomiting., Disp: 12 each, Rfl: 0 .  promethazine (PHENERGAN) 25 MG tablet, Take 25 mg by mouth every 8 (eight) hours as needed for nausea or vomiting., Disp: , Rfl:  .  PROVENTIL HFA 108 (90 Base) MCG/ACT inhaler, INHALE 2 PUFFS BY MOUTH EVERY 6 HOURS AS NEEDED FOR COUGHING, WHEEZING, OR SHORTNESS OF BREATH (Patient taking differently: Inhale 2 puffs into the lungs every 6 (six) hours as needed for wheezing or shortness of breath. ), Disp: 6.7 g, Rfl: 0 .  sertraline  (ZOLOFT) 50 MG tablet, Take 50 mg by mouth at bedtime., Disp: , Rfl:  .  Fluticasone-Salmeterol (ADVAIR) 100-50 MCG/DOSE AEPB, INHALE 1 PUFF BY MOUTH TWICE DAILY. RINSE MOUTH AFTER EACH USE. (Patient not taking: Reported on 01/14/2020), Disp: 60 each, Rfl: 0 .  isosorbide mononitrate (IMDUR) 30 MG 24 hr tablet, Take 1 tablet (30 mg total) by mouth daily., Disp: 90 tablet, Rfl: 3 .  pantoprazole (PROTONIX) 40 MG tablet, Take 1 tablet (40 mg total) by mouth 2 (two) times daily before a meal. (Patient not taking: Reported on 01/14/2020), Disp: 60 tablet, Rfl: 11   Review of Systems  Per HPI unless specifically indicated above     Objective:    BP 90/60   Pulse (!) 109   Temp (!) 97.5 F (36.4 C)   Ht 5\' 8"  (1.727 m)   Wt 171 lb 6.4 oz (77.7 kg)   SpO2 97%   BMI 26.06 kg/m   Wt Readings from Last 3 Encounters:  01/14/20 171 lb 6.4 oz (77.7 kg)  12/21/19 166 lb (75.3 kg)  11/22/19 164 lb 9.6 oz (74.7 kg)    Physical Exam Vitals reviewed.  Constitutional:      General: He is not in acute distress.    Appearance: He is well-developed. He is not ill-appearing.  HENT:     Head: Normocephalic and atraumatic.  Cardiovascular:     Rate and Rhythm: Normal rate and regular rhythm.  Pulmonary:     Effort: Pulmonary effort is normal.     Breath sounds: Normal breath sounds. No wheezing.  Abdominal:     General: Bowel sounds are normal.     Palpations: Abdomen is soft.     Tenderness: There is no abdominal tenderness.  Musculoskeletal:     Cervical back: Neck supple.     Right lower leg: No edema.     Left lower leg: No edema.  Lymphadenopathy:     Cervical: No cervical adenopathy.  Skin:    General: Skin is warm and dry.  Neurological:     Mental Status: He is alert and oriented to person, place, and time.  Psychiatric:        Behavior: Behavior normal.     Results for orders placed or performed during the hospital encounter of 01/11/20  Lipid panel  Result Value Ref Range    Cholesterol 193 0 - 200 mg/dL   Triglycerides 01/13/20 (H) <150 mg/dL   HDL 41 174 mg/dL   Total CHOL/HDL Ratio 4.7 RATIO   VLDL 32 0 - 40 mg/dL   LDL Cholesterol >94 (H) 0 - 99 mg/dL  Comprehensive metabolic panel  Result Value Ref Range  Sodium 133 (L) 135 - 145 mmol/L   Potassium 4.4 3.5 - 5.1 mmol/L   Chloride 101 98 - 111 mmol/L   CO2 22 22 - 32 mmol/L   Glucose, Bld 318 (H) 70 - 99 mg/dL   BUN 23 (H) 6 - 20 mg/dL   Creatinine, Ser 9.16 0.61 - 1.24 mg/dL   Calcium 8.9 8.9 - 94.5 mg/dL   Total Protein 7.3 6.5 - 8.1 g/dL   Albumin 3.7 3.5 - 5.0 g/dL   AST 16 15 - 41 U/L   ALT 18 0 - 44 U/L   Alkaline Phosphatase 92 38 - 126 U/L   Total Bilirubin 0.4 0.3 - 1.2 mg/dL   GFR calc non Af Amer >60 >60 mL/min   GFR calc Af Amer >60 >60 mL/min   Anion gap 10 5 - 15  Hemoglobin A1c  Result Value Ref Range   Hgb A1c MFr Bld 14.6 (H) 4.8 - 5.6 %   Mean Plasma Glucose 372.32 mg/dL      Assessment & Plan:   Encounter Diagnoses  Name Primary?  Marland Kitchen Uncontrolled type 2 diabetes mellitus with hyperglycemia (HCC) Yes  . Hyperlipidemia, unspecified hyperlipidemia type   . Chronic obstructive pulmonary disease, unspecified COPD type (HCC)   . Tobacco use disorder   . Mood disorder (HCC)   . Coronary artery disease involving native coronary artery of native heart without angina pectoris       -reviewed labs with pt -will Refer to endocrinology for inability to make improvements in his glycemic control -No med changes today -he is encouraged on smoking cessation -pt to follow up  3 months.  He is to contact office sooner prn

## 2020-01-17 ENCOUNTER — Encounter: Payer: Self-pay | Admitting: "Endocrinology

## 2020-01-17 ENCOUNTER — Other Ambulatory Visit: Payer: Self-pay

## 2020-01-17 ENCOUNTER — Ambulatory Visit (INDEPENDENT_AMBULATORY_CARE_PROVIDER_SITE_OTHER): Payer: Self-pay | Admitting: "Endocrinology

## 2020-01-17 VITALS — BP 92/64 | HR 104 | Ht 68.0 in | Wt 171.0 lb

## 2020-01-17 DIAGNOSIS — E1165 Type 2 diabetes mellitus with hyperglycemia: Secondary | ICD-10-CM

## 2020-01-17 MED ORDER — LANTUS SOLOSTAR 100 UNIT/ML ~~LOC~~ SOPN
40.0000 [IU] | PEN_INJECTOR | Freq: Every day | SUBCUTANEOUS | Status: DC
Start: 2020-01-17 — End: 2020-05-08

## 2020-01-17 MED ORDER — INSULIN ASPART 100 UNIT/ML ~~LOC~~ SOLN: 10.0000 [IU] | Freq: Three times a day (TID) | SUBCUTANEOUS | 2 refills | Status: DC

## 2020-01-17 MED ORDER — METFORMIN HCL 500 MG PO TABS: 500.0000 mg | ORAL_TABLET | Freq: Two times a day (BID) | ORAL | 1 refills | Status: DC

## 2020-01-17 NOTE — Patient Instructions (Signed)

## 2020-01-17 NOTE — Progress Notes (Addendum)
Endocrinology Consult Note       01/17/2020, 9:47 PM   Subjective:    Patient ID: [name] [name], male    DOB: 07-17-1970.  [name] [name] is being seen in consultation for management of currently uncontrolled symptomatic diabetes requested by  [name], PA-C.   Past Medical History:  Diagnosis Date  . Anxiety   . Arthritis   . CAD (coronary artery disease)    Moderate LAD disease 2016 - Dr. Jacinto Halim  . Chronic bronchitis (HCC)   . Chronic upper back pain   . COPD (chronic obstructive pulmonary disease) (HCC)   . Depression   . Diabetic peripheral neuropathy (HCC)   . GERD (gastroesophageal reflux disease)   . History of gout   . Hyperlipemia   . Hypertension   . Migraine   . Noncompliance   . 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 Sleep apnea 2016   "could not afford CPAP".  . Type 2 diabetes mellitus (HCC)     Past Surgical History:  Procedure Laterality Date  . ANKLE SURGERY Right 1982   "had extra bones in there; took them out"  . APPENDECTOMY  1975  . BIOPSY  12/26/2018   Procedure: BIOPSY;  Surgeon: Malissa Hippo, MD;  Location: [facility];  Service: Endoscopy;;  duodenal biopsies  . BIOPSY  03/23/2019   Procedure: BIOPSY;  Surgeon: Malissa Hippo, MD;  Location: [facility];  Service: Endoscopy;;  esophagus  . CARDIAC CATHETERIZATION N/A 03/28/2015   Procedure: Left Heart Cath and Coronary Angiography;  Surgeon: Yates Decamp, MD;  Location: [facility] INVASIVE CV LAB;  Service: Cardiovascular;  Laterality: N/A;  . CARDIAC CATHETERIZATION N/A 03/28/2015   Procedure: Intravascular Pressure Wire/FFR Study;  Surgeon: Yates Decamp, MD;  Location: [facility] INVASIVE CV LAB;  Service: Cardiovascular;  Laterality: N/A;  . CARPAL TUNNEL RELEASE Left ~ 2008  . COLONOSCOPY WITH ESOPHAGOGASTRODUODENOSCOPY (EGD)    . COLONOSCOPY WITH PROPOFOL N/A 12/24/2019   Procedure: COLONOSCOPY WITH PROPOFOL;   Surgeon: Malissa Hippo, MD;  Location: [facility];  Service: Endoscopy;  Laterality: N/A;  955  . ELBOW FRACTURE SURGERY Left ~ 2008  . ESOPHAGOGASTRODUODENOSCOPY N/A 12/26/2018   Procedure: ESOPHAGOGASTRODUODENOSCOPY (EGD);  Surgeon: Malissa Hippo, MD;  Location: [facility];  Service: Endoscopy;  Laterality: N/A;  . ESOPHAGOGASTRODUODENOSCOPY (EGD) WITH PROPOFOL N/A 03/23/2019   Procedure: ESOPHAGOGASTRODUODENOSCOPY (EGD) WITH PROPOFOL;  Surgeon: Malissa Hippo, MD;  Location: [facility];  Service: Endoscopy;  Laterality: N/A;  7:30  . FRACTURE SURGERY     ankle and elbow  . PILONIDAL CYST EXCISION N/A 03/24/2017   Procedure: EXCISION CHRONIC  PILONIDAL ABSCESS;  Surgeon: [name] Miyamoto, MD;  Location: [facility];  Service: General;  Laterality: N/A;  . TENDON REPAIR Left ~ 2004   "main tendon in my ankle"    Social History   Socioeconomic History  . Marital status: Single    Spouse name: Not on file  . Number of children: 0  . Years of education: 10th g  . Highest education level: Not on file  Occupational History  . Occupation:  [employer]  Tobacco Use  . Smoking status: Current Every Day Smoker    Packs/day: 0.25    Years: 34.00    Pack years: 8.50    Types: Cigarettes  . Smokeless tobacco: Never Used  . Tobacco comment: 1/2 pack a day  Vaping Use  . Vaping Use: Never used  Substance and Sexual Activity  . Alcohol use: Never    Alcohol/week: 0.0 standard drinks  . Drug use: Never  . Sexual activity: Not Currently  Other Topics Concern  . Not on file  Social History Narrative  . Not on file   Social Determinants of Health   Financial Resource Strain:   . Difficulty of Paying Living Expenses: Not on file  Food Insecurity:   . Worried About Programme researcher, broadcasting/film/video in the Last Year: Not on file  . Ran Out of Food in the Last Year: Not on file  Transportation Needs:   . Lack of Transportation (Medical): Not on file  . Lack of Transportation  (Non-Medical): Not on file  Physical Activity:   . Days of Exercise per Week: Not on file  . Minutes of Exercise per Session: Not on file  Stress:   . Feeling of Stress : Not on file  Social Connections:   . Frequency of Communication with Friends and Family: Not on file  . Frequency of Social Gatherings with Friends and Family: Not on file  . Attends Religious Services: Not on file  . Active Member of Clubs or Organizations: Not on file  . Attends Banker Meetings: Not on file  . Marital Status: Not on file    Family History  Problem Relation Age of Onset  . Congestive Heart Failure Sister   . Diabetes Mother   . Hypertension Mother   . Cancer Mother   . Diabetes Father   . Hypertension Father   . Hyperlipidemia Father     Outpatient Encounter Medications as of 01/17/2020  Medication Sig  . Fluticasone-Salmeterol (ADVAIR) 100-50 MCG/DOSE AEPB INHALE 1 PUFF BY MOUTH TWICE DAILY. RINSE MOUTH AFTER EACH USE.  . pantoprazole (PROTONIX) 40 MG tablet Take 1 tablet (40 mg total) by mouth 2 (two) times daily before a meal.  . acetaminophen (TYLENOL) 325 MG tablet Take 2 tablets (650 mg total) by mouth every 6 (six) hours as needed for moderate pain. (Patient taking differently: Take 650-975 mg by mouth every 6 (six) hours as needed for moderate pain. )  . ARIPiprazole (ABILIFY) 5 MG tablet Take 5 mg by mouth daily.  Marland Kitchen aspirin 81 MG tablet Take 1 tablet (81 mg total) by mouth at bedtime.  Marland Kitchen atorvastatin (LIPITOR) 80 MG tablet TAKE 1 Tablet BY MOUTH ONCE EVERY DAY  . Carboxymethylcellul-Glycerin (LUBRICATING EYE DROPS OP) Place 1 drop into both eyes daily as needed (itching eyes).  . DULERA 100-5 MCG/ACT AERO INHALE 2 PUFFS BY MOUTH TWICE DAILY. RINSE MOUTH AFTER EACH USE (Patient taking differently: Inhale 2 puffs into the lungs 2 (two) times daily. )  . ibuprofen (ADVIL) 200 MG tablet Take 400-600 mg by mouth every 6 (six) hours as needed for moderate pain.  Marland Kitchen insulin aspart  (NOVOLOG) 100 UNIT/ML injection Inject 10-16 Units into the skin 3 (three) times daily before meals.  . insulin glargine (LANTUS SOLOSTAR) 100 UNIT/ML Solostar Pen Inject 40 Units into the skin at bedtime.  . isosorbide mononitrate (IMDUR) 30 MG 24 hr tablet Take 1 tablet (30 mg total) by mouth daily.  Marland Kitchen  metFORMIN (GLUCOPHAGE) 500 MG tablet Take 1 tablet (500 mg total) by mouth 2 (two) times daily with a meal.  . metoCLOPramide (REGLAN) 5 MG tablet Take 1 tablet (5 mg total) by mouth 4 (four) times daily -  before meals and at bedtime. (Patient taking differently: Take 5 mg by mouth 2 (two) times daily as needed for nausea or vomiting. )  . nitroGLYCERIN (NITROSTAT) 0.4 MG SL tablet Place 1 tablet (0.4 mg total) under the tongue every 5 (five) minutes as needed for chest pain.  . promethazine (PHENERGAN) 25 MG suppository Place 1 suppository (25 mg total) rectally every 8 (eight) hours as needed for refractory nausea / vomiting.  . promethazine (PHENERGAN) 25 MG tablet Take 25 mg by mouth every 8 (eight) hours as needed for nausea or vomiting.  Marland Kitchen PROVENTIL HFA 108 (90 Base) MCG/ACT inhaler INHALE 2 PUFFS BY MOUTH EVERY 6 HOURS AS NEEDED FOR COUGHING, WHEEZING, OR SHORTNESS OF BREATH (Patient taking differently: Inhale 2 puffs into the lungs every 6 (six) hours as needed for wheezing or shortness of breath. )  . sertraline (ZOLOFT) 50 MG tablet Take 50 mg by mouth at bedtime.  . [DISCONTINUED] insulin aspart (NOVOLOG) 100 UNIT/ML injection Before each meal 3 times a day, 140-199 - 2 units, 200-250 - 4 units, 251-299 - 6 units,  300-349 - 8 units,  350 or above 10 units. Dispense syringes and needles as needed, Ok to switch to PEN if approved. Substitute to any brand approved. DX DM2, Code E11.65 (Patient taking differently: Inject 2-10 Units into the skin 3 (three) times daily with meals. )  . [DISCONTINUED] insulin glargine (LANTUS SOLOSTAR) 100 UNIT/ML Solostar Pen Inject 45 Units into the skin at  bedtime.  . [DISCONTINUED] metFORMIN (GLUCOPHAGE) 1000 MG tablet TAKE 1 Tablet  BY MOUTH TWICE DAILY WITH A MEAL (Patient taking differently: Take 1,000 mg by mouth 2 (two) times daily with a meal. )   No facility-administered encounter medications on file as of 01/17/2020.    ALLERGIES: Allergies  Allergen Reactions  . Omeprazole Magnesium Swelling    Face swells, no breathing impairment  . Bee Venom   . Esomeprazole Swelling    Face swells, no breathing impairment    VACCINATION STATUS: Immunization History  Administered Date(s) Administered  . Influenza-Unspecified 03/13/2019  . Moderna SARS-COVID-2 Vaccination 09/11/2019, 10/11/2019  . Pneumococcal Polysaccharide-23 05/25/2017    Diabetes He presents for his initial diabetic visit. He has type 2 diabetes mellitus. Onset time: He was diagnosed at approximate age of 39 years. His disease course has been worsening. There are no hypoglycemic associated symptoms. Pertinent negatives for hypoglycemia include no confusion, headaches, pallor or seizures. There are no diabetic associated symptoms. Pertinent negatives for diabetes include no chest pain, no fatigue, no polydipsia, no polyphagia, no polyuria and no weakness. There are no hypoglycemic complications. Symptoms are worsening. Diabetic complications include heart disease. Risk factors for coronary artery disease include diabetes mellitus, dyslipidemia, male sex, hypertension, family history, tobacco exposure and sedentary lifestyle. Current diabetic treatment includes insulin injections. His weight is fluctuating minimally. He is following a generally unhealthy diet. When asked about meal planning, he reported none. He rarely participates in exercise. (He did not bring any logs nor meter to review.  He denies hypoglycemia.  His A1c was 14.6% on January 11, 2020.) An ACE inhibitor/angiotensin II receptor blocker is being taken. He does not see a podiatrist.Eye exam is not current.   Hyperlipidemia This is a chronic problem. The current  episode started more than 1 year ago. The problem is uncontrolled. Exacerbating diseases include diabetes. Pertinent negatives include no chest pain, myalgias or shortness of breath. Risk factors for coronary artery disease include dyslipidemia, diabetes mellitus, family history, male sex, hypertension and a sedentary lifestyle.  Hypertension This is a chronic problem. The current episode started more than 1 year ago. The problem is controlled. Pertinent negatives include no chest pain, headaches, neck pain, palpitations or shortness of breath. Risk factors for coronary artery disease include diabetes mellitus, dyslipidemia, family history, male gender, sedentary lifestyle and smoking/tobacco exposure. Past treatments include direct vasodilators. Hypertensive end-organ damage includes CAD/MI.     Review of Systems  Constitutional: Negative for chills, fatigue, fever and unexpected weight change.  HENT: Negative for dental problem, mouth sores and trouble swallowing.   Eyes: Negative for visual disturbance.  Respiratory: Negative for cough, choking, chest tightness, shortness of breath and wheezing.   Cardiovascular: Negative for chest pain, palpitations and leg swelling.  Gastrointestinal: Negative for abdominal distention, abdominal pain, constipation, diarrhea, nausea and vomiting.  Endocrine: Negative for polydipsia, polyphagia and polyuria.  Genitourinary: Negative for dysuria, flank pain, hematuria and urgency.  Musculoskeletal: Negative for back pain, gait problem, myalgias and neck pain.  Skin: Negative for pallor, rash and wound.  Neurological: Negative for seizures, syncope, weakness, numbness and headaches.  Psychiatric/Behavioral: Negative for confusion and dysphoric mood.    Objective:    Vitals with BMI 01/17/2020 01/14/2020 12/24/2019  Height 5\' 8"  5\' 8"  -  Weight 171 lbs 171 lbs 6 oz -  BMI 26.01 26.07 -  Systolic 92 90  101  Diastolic 64 60 78  Pulse 104 109 87    BP 92/64   Pulse (!) 104   Ht 5\' 8"  (1.727 m)   Wt 171 lb (77.6 kg)   BMI 26.00 kg/m   Wt Readings from Last 3 Encounters:  01/17/20 171 lb (77.6 kg)  01/14/20 171 lb 6.4 oz (77.7 kg)  12/21/19 166 lb (75.3 kg)     Physical Exam Constitutional:      General: He is not in acute distress.    Appearance: He is well-developed.  HENT:     Head: Normocephalic and atraumatic.  Neck:     Thyroid: No thyromegaly.     Trachea: No tracheal deviation.  Cardiovascular:     Rate and Rhythm: Normal rate.     Pulses:          Dorsalis pedis pulses are 1+ on the right side and 1+ on the left side.       Posterior tibial pulses are 1+ on the right side and 1+ on the left side.     Heart sounds: Normal heart sounds, S1 normal and S2 normal. No murmur heard.  No gallop.   Pulmonary:     Effort: Pulmonary effort is normal. No respiratory distress.     Breath sounds: Normal breath sounds. No wheezing.  Abdominal:     General: There is no distension.     Tenderness: There is no abdominal tenderness. There is no guarding.  Musculoskeletal:     Right shoulder: No swelling or deformity.     Cervical back: Normal range of motion and neck supple.  Skin:    General: Skin is warm and dry.     Findings: No rash.     Nails: There is no clubbing.  Neurological:     Mental Status: He is alert and oriented to person, place, and time.  Cranial Nerves: No cranial nerve deficit.     Sensory: No sensory deficit.     Gait: Gait normal.     Deep Tendon Reflexes: Reflexes are normal and symmetric.  Psychiatric:        Speech: Speech normal.        Behavior: Behavior normal. Behavior is cooperative.        Thought Content: Thought content normal.        Judgment: Judgment normal.       CMP ( most recent) CMP     Component Value Date/Time   NA 133 (L) 01/11/2020 0859   K 4.4 01/11/2020 0859   CL 101 01/11/2020 0859   CO2 22 01/11/2020 0859    GLUCOSE 318 (H) 01/11/2020 0859   BUN 23 (H) 01/11/2020 0859   CREATININE 1.06 01/11/2020 0859   CREATININE 1.05 01/23/2019 0927   CALCIUM 8.9 01/11/2020 0859   PROT 7.3 01/11/2020 0859   ALBUMIN 3.7 01/11/2020 0859   AST 16 01/11/2020 0859   ALT 18 01/11/2020 0859   ALKPHOS 92 01/11/2020 0859   BILITOT 0.4 01/11/2020 0859   GFRNONAA >60 01/11/2020 0859   GFRNONAA 83 01/23/2019 0927   GFRAA >60 01/11/2020 0859   GFRAA 96 01/23/2019 0927     Diabetic Labs (most recent): Lab Results  Component Value Date   HGBA1C 14.6 (H) 01/11/2020   HGBA1C 15.2 (H) 08/31/2019   HGBA1C 12.7 (H) 04/24/2019     Lipid Panel ( most recent) Lipid Panel     Component Value Date/Time   CHOL 193 01/11/2020 0859   TRIG 162 (H) 01/11/2020 0859   HDL 41 01/11/2020 0859   CHOLHDL 4.7 01/11/2020 0859   VLDL 32 01/11/2020 0859   LDLCALC 120 (H) 01/11/2020 0859   LDLDIRECT 126.6 (H) 04/24/2019 1318      Lab Results  Component Value Date   TSH 1.772 05/24/2017   TSH 4.778 (H) 11/25/2015     Assessment & Plan:   1. Uncontrolled type 2 diabetes mellitus with hyperglycemia (HCC)   - Christien L Zahradnik has currently uncontrolled symptomatic type 2 DM since  49 years of age,  with most recent A1c of 14.6 %. Recent labs reviewed. - I had a long discussion with him about the progressive nature of diabetes and the pathology behind its complications. -his diabetes is complicated by coronary artery disease and he remains at a high risk for more acute and chronic complications which include CAD, CVA, CKD, retinopathy, and neuropathy. These are all discussed in detail with him.  - I have counseled him on diet  and weight management  by adopting a carbohydrate restricted/protein rich diet. Patient is encouraged to switch to  unprocessed or minimally processed     complex starch and increased protein intake (animal or plant source), fruits, and vegetables. -  he is advised to stick to a routine mealtimes to eat  3 meals  a day and avoid unnecessary snacks ( to snack only to correct hypoglycemia).   - he admits that there is a room for improvement in his food and drink choices. - Suggestion is made for him to avoid simple carbohydrates  from his diet including Cakes, Sweet Desserts, Ice Cream, Soda (diet and regular), Sweet Tea, Candies, Chips, Cookies, Store Bought Juices, Alcohol in Excess of  1-2 drinks a day, Artificial Sweeteners,  Coffee Creamer, and "Sugar-free" Products. This will help patient to have more stable blood glucose profile and potentially avoid unintended weight gain.  -  he will be scheduled with Norm Salt, RDN, CDE for diabetes education.  - I have approached him with the following individualized plan to manage  his diabetes and patient agrees:   -In light of his current and prevailing glycemic burden, he will continue to require intensive treatment with basal/bolus insulin in order for him to achieve and maintain control of diabetes to target. -Accordingly, he is advised to adjust his Lantus at 40 units daily at bedtime , and prandial insulin NovoLog at 10 units 3 times a day with meals  for pre-meal BG readings of 90-150mg /dl, plus patient specific correction dose for unexpected hyperglycemia above 150mg /dl, associated with strict monitoring of glucose 4 times a day-before meals and at bedtime. - he is warned not to take insulin without proper monitoring per orders. - Adjustment parameters are given to him for hypo and hyperglycemia in writing. - he is encouraged to call clinic for blood glucose levels less than 70 or above 300 mg /dl. - he is advised to continue Metformin 500 mg p.o. twice daily, therapeutically suitable for patient .  - he is not a candidate for incretin therapy due to his current heavy smoking, at risk for pancreatitis.    - Specific targets for  A1c;  LDL, HDL,  and Triglycerides were discussed with the patient.  2) Blood Pressure /Hypertension:  his blood  pressure is  controlled to target.   he is advised to continue his current medications including Imdur 30 mg p.o. daily with breakfast . 3) Lipids/Hyperlipidemia:   Review of his recent lipid panel showed un controlled  LDL at 126 .  he  is advised to continue    atorvastatin 80 mg daily at bedtime.  Side effects and precautions discussed with him.  4)  Weight/Diet:  Body mass index is 26 kg/m.  -  he is  a candidate for some weight loss. I discussed with him the fact that loss of 5 - 10% of his  current body weight will have the most impact on his diabetes management.  Exercise, and detailed carbohydrates information provided  -  detailed on discharge instructions.  5) Chronic Care/Health Maintenance:  -he  is on Statin medications and  is encouraged to initiate and continue to follow up with Ophthalmology, Dentist,  Podiatrist at least yearly or according to recommendations, and advised to  quit smoking. I have recommended yearly flu vaccine and pneumonia vaccine at least every 5 years; moderate intensity exercise for up to 150 minutes weekly; and  sleep for at least 7 hours a day.  - he is  advised to maintain close follow up with [name], PA-C for primary care needs, as well as his other providers for optimal and coordinated care.   - Time spent in this patient care: 60 min, of which > 50% was spent in  counseling  him about his chronically uncontrolled type 2 diabetes, hypertension, hyperlipidemia and the rest reviewing his blood glucose logs , discussing his hypoglycemia and hyperglycemia episodes, reviewing his current and  previous labs / studies  ( including abstraction from other facilities) and medications  doses and developing a  long term treatment plan based on the latest standards of care/ guidelines; and documenting his care.    Please refer to Patient Instructions for Blood Glucose Monitoring and Insulin/Medications Dosing Guide"  in media tab for additional information.  Please  also refer to " Patient Self Inventory" in the Media  tab for reviewed elements of pertinent patient  history.  Brylee L Corliss participated in the discussions, expressed understanding, and voiced agreement with the above plans.  All questions were answered to his satisfaction. he is encouraged to contact clinic should he have any questions or concerns prior to his return visit.   Follow up plan: Return in 7 days for reevaluation. Marquis LunchGebre Morty Ortwein, MD [facility] [address] [place], [place] [address] Phone: [phone]  Fax: [phone]    01/17/2020, 9:47 PM  This note was partially dictated with voice recognition software. Similar sounding words can be transcribed inadequately or may not  be corrected upon review.

## 2020-01-24 ENCOUNTER — Ambulatory Visit (INDEPENDENT_AMBULATORY_CARE_PROVIDER_SITE_OTHER): Payer: Self-pay | Admitting: "Endocrinology

## 2020-01-24 ENCOUNTER — Other Ambulatory Visit: Payer: Self-pay

## 2020-01-24 ENCOUNTER — Encounter: Payer: Self-pay | Admitting: "Endocrinology

## 2020-01-24 VITALS — BP 110/72 | HR 100 | Ht 68.0 in | Wt 176.4 lb

## 2020-01-24 DIAGNOSIS — E1165 Type 2 diabetes mellitus with hyperglycemia: Secondary | ICD-10-CM

## 2020-01-24 DIAGNOSIS — E782 Mixed hyperlipidemia: Secondary | ICD-10-CM

## 2020-01-24 DIAGNOSIS — F17200 Nicotine dependence, unspecified, uncomplicated: Secondary | ICD-10-CM

## 2020-01-24 NOTE — Patient Instructions (Signed)

## 2020-01-24 NOTE — Progress Notes (Signed)
01/24/2020, 5:26 PM  Endocrinology follow-up note   Subjective:    Patient ID: [name] [name], male    DOB: 12-Jun-1970.  [name] [name] is being seen in follow-up after he was seen in consultation for management of currently uncontrolled symptomatic diabetes requested by  [name], PA-C.   Past Medical History:  Diagnosis Date  . Anxiety   . Arthritis   . CAD (coronary artery disease)    Moderate LAD disease 2016 - Dr. Jacinto Halim  . Chronic bronchitis (HCC)   . Chronic upper back pain   . COPD (chronic obstructive pulmonary disease) (HCC)   . Depression   . Diabetic peripheral neuropathy (HCC)   . GERD (gastroesophageal reflux disease)   . History of gout   . Hyperlipemia   . Hypertension   . Migraine   . Noncompliance   . 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 Sleep apnea 2016   "could not afford CPAP".  . Type 2 diabetes mellitus (HCC)     Past Surgical History:  Procedure Laterality Date  . ANKLE SURGERY Right 1982   "had extra bones in there; took them out"  . APPENDECTOMY  1975  . BIOPSY  12/26/2018   Procedure: BIOPSY;  Surgeon: Malissa Hippo, MD;  Location: [facility];  Service: Endoscopy;;  duodenal biopsies  . BIOPSY  03/23/2019   Procedure: BIOPSY;  Surgeon: Malissa Hippo, MD;  Location: [facility];  Service: Endoscopy;;  esophagus  . CARDIAC CATHETERIZATION N/A 03/28/2015   Procedure: Left Heart Cath and Coronary Angiography;  Surgeon: Yates Decamp, MD;  Location: [facility] INVASIVE CV LAB;  Service: Cardiovascular;  Laterality: N/A;  . CARDIAC CATHETERIZATION N/A 03/28/2015   Procedure: Intravascular Pressure Wire/FFR Study;  Surgeon: Yates Decamp, MD;  Location: [facility] INVASIVE CV LAB;  Service: Cardiovascular;  Laterality: N/A;  . CARPAL TUNNEL RELEASE Left ~ 2008  . COLONOSCOPY WITH ESOPHAGOGASTRODUODENOSCOPY (EGD)    . COLONOSCOPY WITH PROPOFOL N/A 12/24/2019   Procedure:  COLONOSCOPY WITH PROPOFOL;  Surgeon: Malissa Hippo, MD;  Location: [facility];  Service: Endoscopy;  Laterality: N/A;  955  . ELBOW FRACTURE SURGERY Left ~ 2008  . ESOPHAGOGASTRODUODENOSCOPY N/A 12/26/2018   Procedure: ESOPHAGOGASTRODUODENOSCOPY (EGD);  Surgeon: Malissa Hippo, MD;  [facility]: [facility];  Service: Endoscopy;  Laterality: N/A;  . ESOPHAGOGASTRODUODENOSCOPY (EGD) WITH PROPOFOL N/A 03/23/2019   Procedure: ESOPHAGOGASTRODUODENOSCOPY (EGD) WITH PROPOFOL;  Surgeon: Malissa Hippo, MD;  Location: [facility];  Service: Endoscopy;  Laterality: N/A;  7:30  . FRACTURE SURGERY     ankle and elbow  . PILONIDAL CYST EXCISION N/A 03/24/2017   Procedure: EXCISION CHRONIC  PILONIDAL ABSCESS;  Surgeon: [name] Miyamoto, MD;  Location: [facility];  Service: General;  Laterality: N/A;  . TENDON REPAIR Left ~ 2004   "main tendon in my ankle"    Social History   Socioeconomic History  . Marital status: Single    Spouse name: Not on file  . Number of children: 0  . Years of education: 10th g  . Highest education level: Not on file  Occupational History  . Occupation: [employer]  Tobacco Use  . Smoking status: Current Every Day Smoker    Packs/day: 0.25    Years: 34.00    Pack years: 8.50    Types: Cigarettes  . Smokeless tobacco: Never Used  . Tobacco comment: 1/2 pack a day  Vaping Use  . Vaping Use: Never used  Substance and Sexual Activity  . Alcohol use: Never    Alcohol/week: 0.0 standard drinks  . Drug use: Never  . Sexual activity: Not Currently  Other Topics Concern  . Not on file  Social History Narrative  . Not on file   Social Determinants of Health   Financial Resource Strain:   . Difficulty of Paying Living Expenses: Not on file  Food Insecurity:   . Worried About Programme researcher, broadcasting/film/video in the Last Year: Not on file  . Ran Out of Food in the Last Year: Not on file  Transportation Needs:   . Lack of Transportation (Medical): Not on file   . Lack of Transportation (Non-Medical): Not on file  Physical Activity:   . Days of Exercise per Week: Not on file  . Minutes of Exercise per Session: Not on file  Stress:   . Feeling of Stress : Not on file  Social Connections:   . Frequency of Communication with Friends and Family: Not on file  . Frequency of Social Gatherings with Friends and Family: Not on file  . Attends Religious Services: Not on file  . Active Member of Clubs or Organizations: Not on file  . Attends Banker Meetings: Not on file  . Marital Status: Not on file    Family History  Problem Relation Age of Onset  . Congestive Heart Failure Sister   . Diabetes Mother   . Hypertension Mother   . Cancer Mother   . Diabetes Father   . Hypertension Father   . Hyperlipidemia Father     Outpatient Encounter Medications as of 01/24/2020  Medication Sig  . sertraline (ZOLOFT) 100 MG tablet Take 1 tablet by mouth daily.  Marland Kitchen acetaminophen (TYLENOL) 325 MG tablet Take 2 tablets (650 mg total) by mouth every 6 (six) hours as needed for moderate pain. (Patient taking differently: Take 650-975 mg by mouth every 6 (six) hours as needed for moderate pain. )  . ARIPiprazole (ABILIFY) 5 MG tablet Take 5 mg by mouth daily.  Marland Kitchen aspirin 81 MG tablet Take 1 tablet (81 mg total) by mouth at bedtime.  Marland Kitchen atorvastatin (LIPITOR) 80 MG tablet TAKE 1 Tablet BY MOUTH ONCE EVERY DAY  . Carboxymethylcellul-Glycerin (LUBRICATING EYE DROPS OP) Place 1 drop into both eyes daily as needed (itching eyes).  . DULERA 100-5 MCG/ACT AERO INHALE 2 PUFFS BY MOUTH TWICE DAILY. RINSE MOUTH AFTER EACH USE (Patient taking differently: Inhale 2 puffs into the lungs 2 (two) times daily. )  . Fluticasone-Salmeterol (ADVAIR) 100-50 MCG/DOSE AEPB INHALE 1 PUFF BY MOUTH TWICE DAILY. RINSE MOUTH AFTER EACH USE.  Marland Kitchen ibuprofen (ADVIL) 200 MG tablet Take 400-600 mg by mouth every 6 (six) hours as needed for moderate pain.  Marland Kitchen insulin aspart (NOVOLOG) 100  UNIT/ML injection Inject 10-16 Units into the skin 3 (three) times daily before meals.  . insulin glargine (LANTUS SOLOSTAR) 100 UNIT/ML Solostar Pen Inject 40 Units into the skin at bedtime.  . isosorbide mononitrate (IMDUR) 30 MG 24 hr tablet Take 1 tablet (30 mg total) by mouth daily.  . metFORMIN (GLUCOPHAGE) 500 MG tablet Take 1 tablet (500  mg total) by mouth 2 (two) times daily with a meal.  . metoCLOPramide (REGLAN) 5 MG tablet Take 1 tablet (5 mg total) by mouth 4 (four) times daily -  before meals and at bedtime. (Patient taking differently: Take 5 mg by mouth 2 (two) times daily as needed for nausea or vomiting. )  . nitroGLYCERIN (NITROSTAT) 0.4 MG SL tablet Place 1 tablet (0.4 mg total) under the tongue every 5 (five) minutes as needed for chest pain.  . pantoprazole (PROTONIX) 40 MG tablet Take 1 tablet (40 mg total) by mouth 2 (two) times daily before a meal.  . promethazine (PHENERGAN) 25 MG suppository Place 1 suppository (25 mg total) rectally every 8 (eight) hours as needed for refractory nausea / vomiting.  . promethazine (PHENERGAN) 25 MG tablet Take 25 mg by mouth every 8 (eight) hours as needed for nausea or vomiting.  Marland Kitchen PROVENTIL HFA 108 (90 Base) MCG/ACT inhaler INHALE 2 PUFFS BY MOUTH EVERY 6 HOURS AS NEEDED FOR COUGHING, WHEEZING, OR SHORTNESS OF BREATH (Patient taking differently: Inhale 2 puffs into the lungs every 6 (six) hours as needed for wheezing or shortness of breath. )  . [DISCONTINUED] sertraline (ZOLOFT) 50 MG tablet Take 50 mg by mouth at bedtime.   No facility-administered encounter medications on file as of 01/24/2020.    ALLERGIES: Allergies  Allergen Reactions  . Omeprazole Magnesium Swelling    Face swells, no breathing impairment  . Bee Venom   . Esomeprazole Swelling    Face swells, no breathing impairment    VACCINATION STATUS: Immunization History  Administered Date(s) Administered  . Influenza-Unspecified 03/13/2019  . Moderna SARS-COVID-2  Vaccination 09/11/2019, 10/11/2019  . Pneumococcal Polysaccharide-23 05/25/2017    Diabetes He presents for his follow-up diabetic visit. He has type 2 diabetes mellitus. Onset time: He was diagnosed at approximate age of 49 years. His disease course has been improving. There are no hypoglycemic associated symptoms. Pertinent negatives for hypoglycemia include no confusion, headaches, pallor or seizures. There are no diabetic associated symptoms. Pertinent negatives for diabetes include no chest pain, no fatigue, no polydipsia, no polyphagia, no polyuria and no weakness. There are no hypoglycemic complications. Symptoms are improving. Diabetic complications include heart disease. Risk factors for coronary artery disease include diabetes mellitus, dyslipidemia, male sex, hypertension, family history, tobacco exposure and sedentary lifestyle. Current diabetic treatment includes insulin injections. His weight is increasing steadily. He is following a generally unhealthy diet. When asked about meal planning, he reported none. He rarely participates in exercise. His home blood glucose trend is decreasing steadily. (He brings ina  Meter and logs showing near target Bg profile, no hypoglycemia.) An ACE inhibitor/angiotensin II receptor blocker is being taken. He does not see a podiatrist.Eye exam is not current.  Hyperlipidemia This is a chronic problem. The current episode started more than 1 year ago. The problem is uncontrolled. Exacerbating diseases include diabetes. Pertinent negatives include no chest pain, myalgias or shortness of breath. Risk factors for coronary artery disease include dyslipidemia, diabetes mellitus, family history, male sex, hypertension and a sedentary lifestyle.  Hypertension This is a chronic problem. The current episode started more than 1 year ago. The problem is controlled. Pertinent negatives include no chest pain, headaches, neck pain, palpitations or shortness of breath. Risk  factors for coronary artery disease include diabetes mellitus, dyslipidemia, family history, male gender, sedentary lifestyle and smoking/tobacco exposure. Past treatments include direct vasodilators. Hypertensive end-organ damage includes CAD/MI.     Review of Systems  Constitutional: Negative  for chills, fatigue, fever and unexpected weight change.  HENT: Negative for dental problem, mouth sores and trouble swallowing.   Eyes: Negative for visual disturbance.  Respiratory: Negative for cough, choking, chest tightness, shortness of breath and wheezing.   Cardiovascular: Negative for chest pain, palpitations and leg swelling.  Gastrointestinal: Negative for abdominal distention, abdominal pain, constipation, diarrhea, nausea and vomiting.  Endocrine: Negative for polydipsia, polyphagia and polyuria.  Genitourinary: Negative for dysuria, flank pain, hematuria and urgency.  Musculoskeletal: Negative for back pain, gait problem, myalgias and neck pain.  Skin: Negative for pallor, rash and wound.  Neurological: Negative for seizures, syncope, weakness, numbness and headaches.  Psychiatric/Behavioral: Negative for confusion and dysphoric mood.    Objective:    Vitals with BMI 01/24/2020 01/17/2020 01/14/2020  Height 5\' 8"  5\' 8"  5\' 8"   Weight 176 lbs 6 oz 171 lbs 171 lbs 6 oz  BMI 26.83 26.01 26.07  Systolic 110 92 90  Diastolic 72 64 60  Pulse 100 104 109    BP 110/72   Pulse 100   Ht 5\' 8"  (1.727 m)   Wt 176 lb 6.4 oz (80 kg)   BMI 26.82 kg/m   Wt Readings from Last 3 Encounters:  01/24/20 176 lb 6.4 oz (80 kg)  01/17/20 171 lb (77.6 kg)  01/14/20 171 lb 6.4 oz (77.7 kg)     Physical Exam Constitutional:      General: He is not in acute distress.    Appearance: He is well-developed.  HENT:     Head: Normocephalic and atraumatic.  Neck:     Thyroid: No thyromegaly.     Trachea: No tracheal deviation.  Cardiovascular:     Rate and Rhythm: Normal rate.     Pulses:           Dorsalis pedis pulses are 1+ on the right side and 1+ on the left side.       Posterior tibial pulses are 1+ on the right side and 1+ on the left side.     Heart sounds: Normal heart sounds, S1 normal and S2 normal. No murmur heard.  No gallop.   Pulmonary:     Effort: Pulmonary effort is normal. No respiratory distress.     Breath sounds: Normal breath sounds. No wheezing.  Abdominal:     General: There is no distension.     Tenderness: There is no abdominal tenderness. There is no guarding.  Musculoskeletal:     Right shoulder: No swelling or deformity.     Cervical back: Normal range of motion and neck supple.  Skin:    General: Skin is warm and dry.     Findings: No rash.     Nails: There is no clubbing.  Neurological:     Mental Status: He is alert and oriented to person, place, and time.     Cranial Nerves: No cranial nerve deficit.     Sensory: No sensory deficit.     Gait: Gait normal.     Deep Tendon Reflexes: Reflexes are normal and symmetric.  Psychiatric:        Speech: Speech normal.        Behavior: Behavior normal. Behavior is cooperative.        Thought Content: Thought content normal.        Judgment: Judgment normal.      CMP     Component Value Date/Time   NA 133 (L) 01/11/2020 0859   K 4.4 01/11/2020 0859  CL 101 01/11/2020 0859   CO2 22 01/11/2020 0859   GLUCOSE 318 (H) 01/11/2020 0859   BUN 23 (H) 01/11/2020 0859   CREATININE 1.06 01/11/2020 0859   CREATININE 1.05 01/23/2019 0927   CALCIUM 8.9 01/11/2020 0859   PROT 7.3 01/11/2020 0859   ALBUMIN 3.7 01/11/2020 0859   AST 16 01/11/2020 0859   ALT 18 01/11/2020 0859   ALKPHOS 92 01/11/2020 0859   BILITOT 0.4 01/11/2020 0859   GFRNONAA >60 01/11/2020 0859   GFRNONAA 83 01/23/2019 0927   GFRAA >60 01/11/2020 0859   GFRAA 96 01/23/2019 0927     Diabetic Labs (most recent): Lab Results  Component Value Date   HGBA1C 14.6 (H) 01/11/2020   HGBA1C 15.2 (H) 08/31/2019   HGBA1C 12.7 (H)  04/24/2019     Lipid Panel ( most recent) Lipid Panel     Component Value Date/Time   CHOL 193 01/11/2020 0859   TRIG 162 (H) 01/11/2020 0859   HDL 41 01/11/2020 0859   CHOLHDL 4.7 01/11/2020 0859   VLDL 32 01/11/2020 0859   LDLCALC 120 (H) 01/11/2020 0859   LDLDIRECT 126.6 (H) 04/24/2019 1318      Lab Results  Component Value Date   TSH 1.772 05/24/2017   TSH 4.778 (H) 11/25/2015     Assessment & Plan:   1. Uncontrolled type 2 diabetes mellitus with hyperglycemia (HCC)   - Jareb L Dickman has currently uncontrolled symptomatic type 2 DM since  49 years of age,  with most recent A1c of 14.6 %. Recent labs reviewed. He brings in a  meter and logs showing near target Bg profile, no hypoglycemia.  - I had a long discussion with him about the progressive nature of diabetes and the pathology behind its complications. -his diabetes is complicated by coronary artery disease and he remains at a high risk for more acute and chronic complications which include CAD, CVA, CKD, retinopathy, and neuropathy. These are all discussed in detail with him.  - I have counseled him on diet  and weight management  by adopting a carbohydrate restricted/protein rich diet. Patient is encouraged to switch to  unprocessed or minimally processed     complex starch and increased protein intake (animal or plant source), fruits, and vegetables. -  he is advised to stick to a routine mealtimes to eat 3 meals  a day and avoid unnecessary snacks ( to snack only to correct hypoglycemia).  - he  admits there is a room for improvement in his diet and drink choices. -  Suggestion is made for him to avoid simple carbohydrates  from his diet including Cakes, Sweet Desserts / Pastries, Ice Cream, Soda (diet and regular), Sweet Tea, Candies, Chips, Cookies, Sweet Pastries,  Store Bought Juices, Alcohol in Excess of  1-2 drinks a day, Artificial Sweeteners, Coffee Creamer, and "Sugar-free" Products. This will help patient  to have stable blood glucose profile and potentially avoid unintended weight gain.  - he will be scheduled with Norm SaltPenny Crumpton, RDN, CDE for diabetes education.  - I have approached him with the following individualized plan to manage  his diabetes and patient agrees:   -In light of his current and prevailing glycemic burden, he will continue to require intensive treatment with basal/bolus insulin in order for him to achieve and maintain control of diabetes to target. -Accordingly, he is advised to continue Lantus 40 units nightly, continue NovoLog at 10 units 3 times a day with meals  for pre-meal BG readings of  90-150mg /dl, plus patient specific correction dose for unexpected hyperglycemia above 150mg /dl, associated with strict monitoring of glucose 4 times a day-before meals and at bedtime. - he is warned not to take insulin without proper monitoring per orders. -He would have benefited from a CGM.  However, he does not have insurance at this time. - Adjustment parameters are given to him for hypo and hyperglycemia in writing. - he is encouraged to call clinic for blood glucose levels less than 70 or above 300 mg /dl. - he is advised to continue Metformin 500 mg p.o. twice daily, therapeutically suitable for patient .  - he is not a candidate for incretin therapy due to his current heavy smoking, at risk for pancreatitis.    - Specific targets for  A1c;  LDL, HDL,  and Triglycerides were discussed with the patient.  2) Blood Pressure /Hypertension:  His blood pressure is controlled to target.  he is advised to continue his current medications including Imdur 30 mg p.o. daily with breakfast . 3) Lipids/Hyperlipidemia:   Review of his recent lipid panel showed un controlled  LDL at 126 .  he  is advised to continue atorvastatin 80 mg p.o. nightly.  Side effects and precautions discussed with him.  4)  Weight/Diet:  Body mass index is 26.82 kg/m.  -  he is  a candidate for some weight loss. I  discussed with him the fact that loss of 5 - 10% of his  current body weight will have the most impact on his diabetes management.  Exercise, and detailed carbohydrates information provided  -  detailed on discharge instructions.  5) Chronic Care/Health Maintenance:  -he  is on Statin medications and  is encouraged to initiate and continue to follow up with Ophthalmology, Dentist,  Podiatrist at least yearly or according to recommendations, and advised to  quit smoking. I have recommended yearly flu vaccine and pneumonia vaccine at least every 5 years; moderate intensity exercise for up to 150 minutes weekly; and  sleep for at least 7 hours a day.  The patient was counseled on the dangers of tobacco use, and was advised to quit.  Reviewed strategies to maximize success, including removing cigarettes and smoking materials from environment.   - he is  advised to maintain close follow up with , PA-C for primary care needs, as well as his other providers for optimal and coordinated care.   - Time spent in this patient care: 60 min, of which > 50% was spent in  counseling  him about his chronically uncontrolled type 2 diabetes, hypertension, hyperlipidemia and the rest reviewing his blood glucose logs , discussing his hypoglycemia and hyperglycemia episodes, reviewing his current and  previous labs / studies  ( including abstraction from other facilities) and medications  doses and developing a  long term treatment plan based on the latest standards of care/ guidelines; and documenting his care.    Please refer to Patient Instructions for Blood Glucose Monitoring and Insulin/Medications Dosing Guide"  in media tab for additional information. Please  also refer to " Patient Self Inventory" in the Media  tab for reviewed elements of pertinent patient history.  Audi L Reineck participated in the discussions, expressed understanding, and voiced agreement with the above plans.  All questions  were answered to his satisfaction. he is encouraged to contact clinic should he have any questions or concerns prior to his return visit.   Follow up plan: Return in 7 days for reevaluation. [name],  MD [facility] [address] [place], [place] [address] Phone: [phone]  Fax: [phone]    01/24/2020, 5:26 PM  This note was partially dictated with voice recognition software. Similar sounding words can be transcribed inadequately or may not  be corrected upon review.

## 2020-02-19 ENCOUNTER — Encounter: Payer: Self-pay | Admitting: Physician Assistant

## 2020-02-20 ENCOUNTER — Ambulatory Visit: Payer: Self-pay | Admitting: Physician Assistant

## 2020-02-20 ENCOUNTER — Encounter: Payer: Self-pay | Admitting: Physician Assistant

## 2020-02-20 ENCOUNTER — Other Ambulatory Visit: Payer: Self-pay | Admitting: Physician Assistant

## 2020-02-20 ENCOUNTER — Other Ambulatory Visit: Payer: Self-pay

## 2020-02-20 DIAGNOSIS — Z20822 Contact with and (suspected) exposure to covid-19: Secondary | ICD-10-CM

## 2020-02-20 DIAGNOSIS — R0981 Nasal congestion: Secondary | ICD-10-CM

## 2020-02-20 DIAGNOSIS — R059 Cough, unspecified: Secondary | ICD-10-CM

## 2020-02-20 NOTE — Progress Notes (Signed)
° °  There were no vitals taken for this visit.   Subjective:    Patient ID: [name] [name], male    DOB: [birth year], 49 y.o.   MRN: [id number]  HPI: [name] [name] is a 49 y.o. male presenting on 02/20/2020 for No chief complaint on file.   HPI    This is a telemedicine appointment due to coronavirus pandemic.  It is via telephone.  I connected with  Ian L Daffern on 02/20/20 by a video enabled telemedicine application and verified that I am speaking with the correct person using two identifiers.   I discussed the limitations of evaluation and management by telemedicine. The patient expressed understanding and agreed to proceed.  Pt is at home.  Provider is at office.   Pt reports exposure to covid on Saturday, September 18.  He says he now has stuffy nose, congestion, cough and some mild SOB.  He denies fever.  He was vaccinatd with 2nd dose in May.   Relevant past medical, surgical, family and social history reviewed and updated as indicated. Interim medical history since our last visit reviewed. Allergies and medications reviewed and updated.  Review of Systems  Per HPI unless specifically indicated above     Objective:    There were no vitals taken for this visit.  Wt Readings from Last 3 Encounters:  01/24/20 176 lb 6.4 oz (80 kg)  01/17/20 171 lb (77.6 kg)  01/14/20 171 lb 6.4 oz (77.7 kg)    Physical Exam Constitutional:      General: He is not in acute distress. Pulmonary:     Effort: No respiratory distress.     Comments: Pt talks in complete sentences without dyspnea.  No audible wheezes. Neurological:     Mental Status: He is alert and oriented to person, place, and time.  Psychiatric:        Attention and Perception: Attention normal.        Speech: Speech normal.        Behavior: Behavior is cooperative.            Assessment & Plan:    Encounter Diagnoses  Name Primary?   Suspected COVID-19 virus infection Yes   Exposure to COVID-19  virus    Cough    Nasal congestion       -pt is scheduled for covid test -pt is counseled on self-quarantine until he get his test results -he has his inhalers to use prn -pt to call office or go to ER if worsens

## 2020-02-22 LAB — NOVEL CORONAVIRUS, NAA: SARS-CoV-2, NAA: NOT DETECTED

## 2020-02-22 LAB — SARS-COV-2, NAA 2 DAY TAT

## 2020-02-22 LAB — SPECIMEN STATUS REPORT

## 2020-02-23 ENCOUNTER — Telehealth: Payer: Self-pay | Admitting: Physician Assistant

## 2020-02-23 NOTE — Telephone Encounter (Signed)
Attempted to call pt yesterday and again today to discuss test results.  Got voicemail.

## 2020-02-25 ENCOUNTER — Telehealth: Payer: Self-pay | Admitting: Physician Assistant

## 2020-02-25 NOTE — Telephone Encounter (Signed)
Pt was called and notified of negative covid 19 test results

## 2020-03-06 ENCOUNTER — Ambulatory Visit: Payer: Self-pay | Admitting: Nutrition

## 2020-03-26 ENCOUNTER — Other Ambulatory Visit: Payer: Self-pay | Admitting: Physician Assistant

## 2020-03-26 ENCOUNTER — Encounter: Payer: Self-pay | Admitting: Nutrition

## 2020-03-26 ENCOUNTER — Encounter: Payer: Self-pay | Attending: "Endocrinology | Admitting: Nutrition

## 2020-03-26 ENCOUNTER — Other Ambulatory Visit: Payer: Self-pay

## 2020-03-26 DIAGNOSIS — E782 Mixed hyperlipidemia: Secondary | ICD-10-CM

## 2020-03-26 DIAGNOSIS — E1165 Type 2 diabetes mellitus with hyperglycemia: Secondary | ICD-10-CM

## 2020-03-26 DIAGNOSIS — E785 Hyperlipidemia, unspecified: Secondary | ICD-10-CM

## 2020-03-26 DIAGNOSIS — F322 Major depressive disorder, single episode, severe without psychotic features: Secondary | ICD-10-CM

## 2020-03-26 DIAGNOSIS — I10 Essential (primary) hypertension: Secondary | ICD-10-CM

## 2020-03-26 NOTE — Progress Notes (Signed)
Medical Nutrition Therapy:  Appt start time: 1000 end time:  1100.   Assessment:  Primary concerns today: Diabetes Type 2. He lives with his mom and dad. Goes to the [facility] [employer] [facility]. Sees Dr. [name], Endocrinology. Lantuss 40 units, Novolog 10 units with meals. Metformin 500 mg BID;.  He is being followed and assisted as needed with [facility]. He does have therapist he works with but still has a lot of depression issues.  Doesn't eat but 1` meal per day. Takes 45 units  Of Lantus at night and Novolog 10 units plus sliding scale with meals. He doesn't have any teeth. He can chew most foods and meats. Has dentures but if he eats with them on, it makes him gag.  Would benefit from a CGM-LIbre-- but doesn't have any insurance.  His mom has metastatic cancer and getting chemotherapy. She has been his main caretaker of meals and his medications.  HIs biggest issues he doesn't have an appetite most of the time, sleeps in the bed and only eats 1-2 main meals in afternoon and dinner. He has taken his novolog without eating and this puts him in extreme danger for hypoglcyemia.  He notes he has had multiple 'black outs' this past year related to low or high blood sugars.  He is willing to work on eat three balanced meals and taking insulin as prescribed with sliding scale. This is what we will focus on for now.  FBS: 215 mg/dl this am.  He gets tired of pricking his finger to test blood sugars. CGM will be very beneficial to improve compliance and accurate dosing of his meal time insulin.  .  Lab Results  Component Value Date   HGBA1C 14.6 (H) 01/11/2020   CMP Latest Ref Rng & Units 01/11/2020 12/21/2019 08/31/2019  Glucose 70 - 99 mg/dL 850(Y) 774(J) 287(O)  BUN 6 - 20 mg/dL 67(E) 20 20  Creatinine 0.61 - 1.24 mg/dL 7.20 9.47 0.96  Sodium 135 - 145 mmol/L 133(L) 136 130(L)  Potassium 3.5 - 5.1 mmol/L 4.4 4.4 4.7  Chloride 98 - 111 mmol/L 101 102 98  CO2 22 - 32 mmol/L 22 23 22   Calcium 8.9 -  10.3 mg/dL 8.9 ) 2.8(Z)  Total Protein 6.5 - 8.1 g/dL 7.3 - 7.5  Total Bilirubin 0.3 - 1.2 mg/dL 0.4 - 1.0  Alkaline Phos 38 - 126 U/L 92 - 95  AST 15 - 41 U/L 16 - 11(L)  ALT 0 - 44 U/L 18 - 12   Wt Readings from Last 3 Encounters:  01/24/20 176 lb 6.4 oz (80 kg)  01/17/20 171 lb (77.6 kg)  01/14/20 171 lb 6.4 oz (77.7 kg)   Ht Readings from Last 3 Encounters:  01/24/20 5\' 8"  (1.727 m)  01/17/20 5\' 8"  (1.727 m)  01/14/20 5\' 8"  (1.727 m)   There is no height or weight on file to calculate BMI. @BMIFA @ Facility age limit for growth percentiles is 20 years. Facility age limit for growth percentiles is 20 years.  Preferred Learning Style:   Auditory  Visual--likes to see stuff but limited reading ability.  Hands on  Learning Readiness:   Ready  Change in progress   MEDICATIONS: see list    DIETARY INTAKE:    Breakfast skipped Lunch saurakraut and hot dogs 1 and navy beans, greens, Sweet tea Dinner: Pork chop, creamed corn, water   Usual physical activity:  ADL   Estimated energy needs: 2000  calories 225 g carbohydrates 150 g protein  50 g fat  Progress Towards Goal(s):  In progress.   Nutritional Diagnosis:  NB-1.1 Food and nutrition-related knowledge deficit As related to Diabetes Type 2.  As evidenced by A1C 14%.    Intervention:  Nutrition and Diabetes education provided on My Plate, CHO counting, meal planning, portion sizes, timing of meals, avoiding snacks between meals unless having a low blood sugar, target ranges for A1C and blood sugars, signs/symptoms and treatment of hyper/hypoglycemia, monitoring blood sugars, taking medications as prescribed, benefits of exercising 30 minutes per day and prevention of complications of DM.    Goals  Follow My Plate Do not skip meals Eat breakfast by 8 am, lunch 12-2 and dinner 5-7 pm. Do not take meal insulin without eating a meal Drink only water  Avoid snacks Test blood sugars before meals and  take insuln according to sliding scale.   Teaching Method Utilized:  Visual Auditory Hands on  Handouts given during visit include:  The Plate Method  Meal Plan Card   Barriers to learning/adherence to lifestyle change: Depression, limited reading  Demonstrated degree of understanding via:  Teach Back   Monitoring/Evaluation:  Dietary intake, exercise,  and body weight in 2 weeks (s).

## 2020-03-26 NOTE — Patient Instructions (Addendum)
Goals  Follow My Plate Do not skip meals Eat breakfast by 8 am, lunch 12-2 and dinner 5-7 pm. Do not take meal insulin without eating a meal Drink only water  Avoid snacks Test blood sugars before meals and take insuln according to sliding scale. Record blood sugars on sheets and bring at each visit with meter.

## 2020-04-03 ENCOUNTER — Telehealth: Payer: Self-pay

## 2020-04-03 NOTE — Telephone Encounter (Signed)
Called client for follow up from [facility] to address any further needs.  No answer, left voicemail requesting return call.  plan to meet with client after appointment at [facility] on 04/10/20 at 0900 Await return call  [name] RN [facility]

## 2020-04-07 ENCOUNTER — Other Ambulatory Visit (HOSPITAL_COMMUNITY)
Admission: RE | Admit: 2020-04-07 | Discharge: 2020-04-07 | Disposition: A | Discharge status: Home / Self Care | Payer: Self-pay | Source: Ambulatory Visit | Attending: Physician Assistant | Admitting: Physician Assistant

## 2020-04-07 DIAGNOSIS — E785 Hyperlipidemia, unspecified: Secondary | ICD-10-CM | POA: Insufficient documentation

## 2020-04-07 DIAGNOSIS — E1165 Type 2 diabetes mellitus with hyperglycemia: Secondary | ICD-10-CM | POA: Insufficient documentation

## 2020-04-07 LAB — COMPREHENSIVE METABOLIC PANEL
ALT: 16 U/L (ref 0–44)
AST: 17 U/L (ref 15–41)
Albumin: 3.3 g/dL — ABNORMAL LOW (ref 3.5–5.0)
Alkaline Phosphatase: 79 U/L (ref 38–126)
Anion gap: 8 (ref 5–15)
BUN: 13 mg/dL (ref 6–20)
CO2: 26 mmol/L (ref 22–32)
Calcium: 8.8 mg/dL — ABNORMAL LOW (ref 8.9–10.3)
Chloride: 103 mmol/L (ref 98–111)
Creatinine, Ser: 0.87 mg/dL (ref 0.61–1.24)
GFR, Estimated: >60 mL/min (ref >60)
Glucose, Bld: 200 mg/dL — ABNORMAL HIGH (ref 70–99)
Potassium: 4.2 mmol/L (ref 3.5–5.1)
Sodium: 137 mmol/L (ref 135–145)
Total Bilirubin: 0.7 mg/dL (ref 0.3–1.2)
Total Protein: 6.5 g/dL (ref 6.5–8.1)

## 2020-04-07 LAB — LIPID PANEL
Cholesterol: 164 mg/dL (ref 0–200)
HDL: 36 mg/dL — ABNORMAL LOW (ref >40)
LDL Cholesterol: 98 mg/dL (ref 0–99)
Total CHOL/HDL Ratio: 4.6 RATIO
Triglycerides: 149 mg/dL (ref <150)
VLDL: 30 mg/dL (ref 0–40)

## 2020-04-08 LAB — MICROALBUMIN, URINE: Microalb, Ur: 811.7 ug/mL — ABNORMAL HIGH

## 2020-04-09 ENCOUNTER — Encounter: Payer: Self-pay | Attending: "Endocrinology | Admitting: Nutrition

## 2020-04-09 ENCOUNTER — Other Ambulatory Visit: Payer: Self-pay

## 2020-04-09 ENCOUNTER — Encounter: Payer: Self-pay | Admitting: Nutrition

## 2020-04-09 VITALS — Ht 68.0 in | Wt 173.0 lb

## 2020-04-09 DIAGNOSIS — E1165 Type 2 diabetes mellitus with hyperglycemia: Secondary | ICD-10-CM | POA: Insufficient documentation

## 2020-04-09 DIAGNOSIS — E782 Mixed hyperlipidemia: Secondary | ICD-10-CM | POA: Insufficient documentation

## 2020-04-09 DIAGNOSIS — I10 Essential (primary) hypertension: Secondary | ICD-10-CM | POA: Insufficient documentation

## 2020-04-09 DIAGNOSIS — F322 Major depressive disorder, single episode, severe without psychotic features: Secondary | ICD-10-CM | POA: Insufficient documentation

## 2020-04-09 NOTE — Progress Notes (Signed)
Medical Nutrition Therapy:  Appt start time: 1420 end time: 1500    Assessment:  Primary concerns today: Diabetes Type 2. He lives with his mom and dad. Goes to the [employer]. Sees Dr. [name], Endocrinology. Lantus 40 units, Novolog 10 units with meals. Metformin 500 mg BID. Meals remain inconsistent. Not taking insulin as prescribed and BS are not well controlled. Complains of not being hungry and sometimes takes his meal time insulin without eating. Sometimes doesn't take night insulin for fear of his BS dropping too low in the middle of the night. Will benefit from on going DM education.  Admits to going to the bathroom at night and urine has strong odor or dark yellow at times. Complains of vision issues and can't see well. Has some medical eye issues.  Recent labs: 04/07/20  TCHOL 164 mg/dl, TG 662 mg/dl, HDL 36 mg/dl LDL 70.  A1C 14.6%. in 12/2019.  Put on a libre Pro to use. WIll read when he gets back for next appt.  He is willing to work on eating better balanced meals and cut out all processed foods, sweets, concentrated sweet beverages.   Continues to deal with depression issues.  He is being followed and assisted as needed with [facility]. He does have therapist he works with but still has a lot of depression issues.    Previous goals set  Do not skip meals-trying to do better. Eat breakfast by 8 am, lunch 12-2 and dinner 5-7 pm- work in progress. Do not take meal insulin without eating a meal-0needs to do better. Drink only water- increased some. Avoid snacks-cut down on that. Test blood sugars before meals and take insuln according to sliding scale. : .  Lab Results  Component Value Date   HGBA1C 14.6 (H) 01/11/2020   CMP Latest Ref Rng & Units 04/07/2020 01/11/2020 12/21/2019  Glucose 70 - 99 mg/dL 947(M) 546(T) 035(W)  BUN 6 - 20 mg/dL 13 65(K) 20  Creatinine 0.61 - 1.24 mg/dL 8.12 7.51 7.00  Sodium 135 - 145 mmol/L 137 133(L) 136  Potassium 3.5 - 5.1 mmol/L  4.2 4.4 4.4  Chloride 98 - 111 mmol/L 103 101 102  CO2 22 - 32 mmol/L 26 22 23   Calcium 8.9 - 10.3 mg/dL ) 8.9 1.7(C)  Total Protein 6.5 - 8.1 g/dL 6.5 7.3 -  Total Bilirubin 0.3 - 1.2 mg/dL 0.7 0.4 -  Alkaline Phos 38 - 126 U/L 79 92 -  AST 15 - 41 U/L 17 16 -  ALT 0 - 44 U/L 16 18 -   Wt Readings from Last 3 Encounters:  04/09/20 173 lb (78.5 kg)  01/24/20 176 lb 6.4 oz (80 kg)  01/17/20 171 lb (77.6 kg)   Ht Readings from Last 3 Encounters:  04/09/20 5\' 8"  (1.727 m)  01/24/20 5\' 8"  (1.727 m)  01/17/20 5\' 8"  (1.727 m)   Body mass index is 26.3 kg/m. @BMIFA @ Facility age limit for growth percentiles is 20 years. Facility age limit for growth percentiles is 20 years.  Preferred Learning Style:   Auditory  Visual--likes to see stuff but limited reading ability.  Hands on  Learning Readiness:   Ready  Change in progress   MEDICATIONS: see list    DIETARY INTAKE:    Breakfast saurakraut and beantie Lunch saurakraut and hot dogs 1 and navy beans, greens, Sweet tea Dinner: Pork chop, creamed corn, water   Usual physical activity:  ADL   Estimated energy needs: 2000  calories 225  g carbohydrates 150 g protein 50 g fat  Progress Towards Goal(s):  In progress.   Nutritional Diagnosis:  NB-1.1 Food and nutrition-related knowledge deficit As related to Diabetes Type 2.  As evidenced by A1C 14%.    Intervention:  Nutrition and Diabetes education provided on My Plate, CHO counting, meal planning, portion sizes, timing of meals, avoiding snacks between meals unless having a low blood sugar, target ranges for A1C and blood sugars, signs/symptoms and treatment of hyper/hypoglycemia, monitoring blood sugars, taking medications as prescribed, benefits of exercising 30 minutes per day and prevention of complications of DM.    Goals  Follow My Plate Do not skip meals-trying to do better. Eat breakfast by 8 am, lunch 12-2 and dinner 5-7 pm. Do not take meal  insulin without eating a meal Drink only water increased some. Avoid snacks-cut down on that. Test blood sugars before meals and take insuln according to sliding scale.   Teaching Method Utilized:  Visual Auditory Hands on  Handouts given during visit include:  The Plate Method  Meal Plan Card   Barriers to learning/adherence to lifestyle change: Depression, limited reading  Demonstrated degree of understanding via:  Teach Back   Monitoring/Evaluation:  Dietary intake, exercise,  and body weight in 2 weeks (s).

## 2020-04-09 NOTE — Patient Instructions (Addendum)
Goals  Cut  Out sourkraut , hot dogs, beanie weenies, bologna, sausage and bacon, Increase cabbage, green beans, carrots, salads, low carb vegetables Drink only water Eat meals on time Walk 30 minutes a day. Get A1C down to 8% or less. Record BS before meals and bedtime.

## 2020-04-10 ENCOUNTER — Encounter: Payer: Self-pay | Admitting: Physician Assistant

## 2020-04-10 ENCOUNTER — Ambulatory Visit: Payer: Self-pay | Admitting: Physician Assistant

## 2020-04-10 VITALS — BP 108/70 | HR 98 | Temp 97.9°F | Ht 68.0 in | Wt 171.0 lb

## 2020-04-10 DIAGNOSIS — E1165 Type 2 diabetes mellitus with hyperglycemia: Secondary | ICD-10-CM

## 2020-04-10 DIAGNOSIS — E785 Hyperlipidemia, unspecified: Secondary | ICD-10-CM

## 2020-04-10 DIAGNOSIS — J449 Chronic obstructive pulmonary disease, unspecified: Secondary | ICD-10-CM

## 2020-04-10 NOTE — Progress Notes (Signed)
BP 108/70   Pulse 98   Temp 97.9 F (36.6 C)   Ht 5\' 8"  (1.727 m)   Wt 171 lb (77.6 kg)   SpO2 97%   BMI 26.00 kg/m    Subjective:    Patient ID: , male    DOB: 20-Jan-1971, 49 y.o.   MRN: [id number]  HPI: [name] [name] is a 49 y.o. male presenting on 04/10/2020 for Hyperlipidemia   HPI      Pt had a negative  covid 19 screening questionnaire.     Chief Complaint  Patient presents with  . Hyperlipidemia     He sees endocrinology for DM.  He says he has appointment 12/1. He goes to [facility] for MH issues He saw the nutritionist yesterday He has No complaints today other than sleep issues.  He is working with [facility] on this and is getting medication to help.   Pt has no other complaints today.    Relevant past medical, surgical, family and social history reviewed and updated as indicated. Interim medical history since our last visit reviewed. Allergies and medications reviewed and updated.   Current Outpatient Medications:  .  acetaminophen (TYLENOL) 325 MG tablet, Take 2 tablets (650 mg total) by mouth every 6 (six) hours as needed for moderate pain. (Patient taking differently: Take 650-975 mg by mouth every 6 (six) hours as needed for moderate pain. ), Disp: , Rfl:  .  ARIPiprazole (ABILIFY) 5 MG tablet, Take 5 mg by mouth daily., Disp: , Rfl:  .  aspirin 81 MG tablet, Take 1 tablet (81 mg total) by mouth at bedtime., Disp: 30 tablet, Rfl:  .  atorvastatin (LIPITOR) 80 MG tablet, TAKE 1 Tablet BY MOUTH ONCE EVERY DAY, Disp: 90 tablet, Rfl: 0 .  Carboxymethylcellul-Glycerin (LUBRICATING EYE DROPS OP), Place 1 drop into both eyes daily as needed (itching eyes)., Disp: , Rfl:  .  DULERA 100-5 MCG/ACT AERO, INHALE 2 PUFFS BY MOUTH TWICE DAILY. RINSE MOUTH AFTER EACH USE (Patient taking differently: Inhale 2 puffs into the lungs 2 (two) times daily. ), Disp: 13 g, Rfl: 0 .  ibuprofen (ADVIL) 200 MG tablet, Take 400-600 mg by mouth every 6 (six) hours  as needed for moderate pain., Disp: , Rfl:  .  insulin aspart (NOVOLOG) 100 UNIT/ML injection, Inject 10-16 Units into the skin 3 (three) times daily before meals., Disp: 15 mL, Rfl: 2 .  insulin glargine (LANTUS SOLOSTAR) 100 UNIT/ML Solostar Pen, Inject 40 Units into the skin at bedtime., Disp: 15 mL, Rfl:  .  isosorbide mononitrate (IMDUR) 30 MG 24 hr tablet, Take 1 tablet (30 mg total) by mouth daily., Disp: 90 tablet, Rfl: 3 .  metFORMIN (GLUCOPHAGE) 500 MG tablet, Take 1 tablet (500 mg total) by mouth 2 (two) times daily with a meal., Disp: 60 tablet, Rfl: 1 .  metoCLOPramide (REGLAN) 5 MG tablet, Take 1 tablet (5 mg total) by mouth 4 (four) times daily -  before meals and at bedtime. (Patient taking differently: Take 5 mg by mouth 2 (two) times daily as needed for nausea or vomiting. ), Disp: 120 tablet, Rfl: 1 .  nitroGLYCERIN (NITROSTAT) 0.4 MG SL tablet, Place 1 tablet (0.4 mg total) under the tongue every 5 (five) minutes as needed for chest pain., Disp: 25 tablet, Rfl: 3 .  pantoprazole (PROTONIX) 40 MG tablet, Take 1 tablet (40 mg total) by mouth 2 (two) times daily before a meal., Disp: 60 tablet, Rfl: 11 .  promethazine (  PHENERGAN) 25 MG suppository, Place 1 suppository (25 mg total) rectally every 8 (eight) hours as needed for refractory nausea / vomiting., Disp: 12 each, Rfl: 0 .  promethazine (PHENERGAN) 25 MG tablet, Take 25 mg by mouth every 8 (eight) hours as needed for nausea or vomiting., Disp: , Rfl:  .  PROVENTIL HFA 108 (90 Base) MCG/ACT inhaler, INHALE 2 PUFFS BY MOUTH EVERY 6 HOURS AS NEEDED FOR COUGHING, WHEEZING, OR SHORTNESS OF BREATH, Disp: 6.7 g, Rfl: 0 .  sertraline (ZOLOFT) 100 MG tablet, Take 1 tablet by mouth daily., Disp: , Rfl:  .  WIXELA INHUB 100-50 MCG/DOSE AEPB, INHALE 1 PUFF BY MOUTH TWICE DAILY. RINSE MOUTH AFTER EACH USE., Disp: 60 each, Rfl: 0   Review of Systems  Per HPI unless specifically indicated above     Objective:    BP 108/70   Pulse 98    Temp 97.9 F (36.6 C)   Ht 5\' 8"  (1.727 m)   Wt 171 lb (77.6 kg)   SpO2 97%   BMI 26.00 kg/m   Wt Readings from Last 3 Encounters:  04/10/20 171 lb (77.6 kg)  04/09/20 173 lb (78.5 kg)  01/24/20 176 lb 6.4 oz (80 kg)    Physical Exam Vitals reviewed.  Constitutional:      General: He is not in acute distress.    Appearance: He is well-developed. He is not ill-appearing.     Comments: Disheveled, malodorous  HENT:     Head: Normocephalic and atraumatic.  Cardiovascular:     Rate and Rhythm: Normal rate and regular rhythm.  Pulmonary:     Effort: Pulmonary effort is normal.     Breath sounds: Normal breath sounds. No wheezing.  Abdominal:     General: Bowel sounds are normal.     Palpations: Abdomen is soft.     Tenderness: There is no abdominal tenderness.  Musculoskeletal:     Cervical back: Neck supple.     Right lower leg: No edema.     Left lower leg: No edema.  Lymphadenopathy:     Cervical: No cervical adenopathy.  Skin:    General: Skin is warm and dry.  Neurological:     Mental Status: He is alert and oriented to person, place, and time.  Psychiatric:        Attention and Perception: Attention normal.        Behavior: Behavior normal. Behavior is cooperative.     Results for orders placed or performed during the hospital encounter of 04/07/20  Microalbumin, urine  Result Value Ref Range   Microalb, Ur 811.7 (H) Not Estab. ug/mL  Lipid panel  Result Value Ref Range   Cholesterol 164 0 - 200 mg/dL   Triglycerides 13/08/21 509 mg/dL   HDL 36 (L) <326 mg/dL   Total CHOL/HDL Ratio 4.6 RATIO   VLDL 30 0 - 40 mg/dL   LDL Cholesterol 98 0 - 99 mg/dL  Comprehensive metabolic panel  Result Value Ref Range   Sodium 137 135 - 145 mmol/L   Potassium 4.2 3.5 - 5.1 mmol/L   Chloride 103 98 - 111 mmol/L   CO2 26 22 - 32 mmol/L   Glucose, Bld 200 (H) 70 - 99 mg/dL   BUN 13 6 - 20 mg/dL   Creatinine, Ser >71 0.61 - 1.24 mg/dL   Calcium 8.8 (L) 8.9 - 10.3 mg/dL    Total Protein 6.5 6.5 - 8.1 g/dL   Albumin 3.3 (L) 3.5 - 5.0 g/dL  AST 17 15 - 41 U/L   ALT 16 0 - 44 U/L   Alkaline Phosphatase 79 38 - 126 U/L   Total Bilirubin 0.7 0.3 - 1.2 mg/dL   GFR, Estimated >91 >63 mL/min   Anion gap 8 5 - 15      Assessment & Plan:    Encounter Diagnoses  Name Primary?  . Hyperlipidemia, unspecified hyperlipidemia type Yes  . Uncontrolled type 2 diabetes mellitus with hyperglycemia (HCC)   . Chronic obstructive pulmonary disease, unspecified COPD type (HCC)      -reviewed labs with pt -will refer for Dm eye exam -pt to continue with endocrinology for diabetes management -pt to continue current medications.  He is reminded that he needs to bring his medications with him to his appointment -encouraged smoking cessation -pt to follow up 3 months.  He is to contact office sooner prn

## 2020-04-16 ENCOUNTER — Other Ambulatory Visit: Payer: Self-pay | Admitting: Physician Assistant

## 2020-04-28 ENCOUNTER — Telehealth: Payer: Self-pay | Admitting: Nutrition

## 2020-04-28 ENCOUNTER — Encounter: Payer: Self-pay | Admitting: Nutrition

## 2020-04-28 DIAGNOSIS — F322 Major depressive disorder, single episode, severe without psychotic features: Secondary | ICD-10-CM

## 2020-04-28 DIAGNOSIS — E782 Mixed hyperlipidemia: Secondary | ICD-10-CM

## 2020-04-28 DIAGNOSIS — I10 Essential (primary) hypertension: Secondary | ICD-10-CM

## 2020-04-28 DIAGNOSIS — E1165 Type 2 diabetes mellitus with hyperglycemia: Secondary | ICD-10-CM

## 2020-04-28 NOTE — Telephone Encounter (Signed)
TC from patient to cancel his appointment on Wednesday. He notes he has cough, congestion and feels like he has the FLU. He notes he ran out of testing strips but got some more a few days ago. Trying to do better with testing. FBS in 200's still. Still skipping meals- breakfast mainly because he doesn't get up til about 11 am.  Will reschedule his appointment when he schedules follow up with Dr. [name].  Advised to eat breakfast no later than 9 am, don't skip meals and test blood sugars before meals and use sliding scale insulin as recommended. He verbalized understanding.  Will download his Josephine Igo PRO when he comes back to next appointment.

## 2020-04-28 NOTE — Telephone Encounter (Signed)
Ok. Thanks!

## 2020-04-30 ENCOUNTER — Ambulatory Visit: Payer: Self-pay | Admitting: "Endocrinology

## 2020-04-30 ENCOUNTER — Ambulatory Visit: Payer: Self-pay | Admitting: Nutrition

## 2020-05-08 ENCOUNTER — Ambulatory Visit (INDEPENDENT_AMBULATORY_CARE_PROVIDER_SITE_OTHER): Payer: Self-pay | Admitting: "Endocrinology

## 2020-05-08 ENCOUNTER — Encounter: Payer: Self-pay | Attending: "Endocrinology | Admitting: Nutrition

## 2020-05-08 ENCOUNTER — Other Ambulatory Visit: Payer: Self-pay

## 2020-05-08 ENCOUNTER — Encounter: Payer: Self-pay | Admitting: "Endocrinology

## 2020-05-08 VITALS — Ht 68.0 in | Wt 169.8 lb

## 2020-05-08 VITALS — BP 130/84 | HR 114 | Ht 68.0 in | Wt 169.5 lb

## 2020-05-08 DIAGNOSIS — E782 Mixed hyperlipidemia: Secondary | ICD-10-CM

## 2020-05-08 DIAGNOSIS — E1165 Type 2 diabetes mellitus with hyperglycemia: Secondary | ICD-10-CM

## 2020-05-08 DIAGNOSIS — I10 Essential (primary) hypertension: Secondary | ICD-10-CM

## 2020-05-08 DIAGNOSIS — F17200 Nicotine dependence, unspecified, uncomplicated: Secondary | ICD-10-CM

## 2020-05-08 LAB — POCT GLYCOSYLATED HEMOGLOBIN (HGB A1C): HbA1c, POC (controlled diabetic range): 14.6 % — AB (ref 0.0–7.0)

## 2020-05-08 MED ORDER — INSULIN ASPART 100 UNIT/ML ~~LOC~~ SOLN: 14.0000 [IU] | Freq: Three times a day (TID) | SUBCUTANEOUS | 2 refills | Status: DC

## 2020-05-08 MED ORDER — LANTUS SOLOSTAR 100 UNIT/ML ~~LOC~~ SOPN
60.0000 [IU] | PEN_INJECTOR | Freq: Every day | SUBCUTANEOUS | Status: DC
Start: 2020-05-08 — End: 2020-11-10

## 2020-05-08 NOTE — Progress Notes (Signed)
05/08/2020, 1:48 PM  Endocrinology follow-up note   Subjective:    Patient ID: [name] [name], male    DOB: Jan 08, 1971.  [name] [name] is being seen in follow-up after he was seen in consultation for management of currently uncontrolled symptomatic diabetes requested by  Jacquelin HawkingMcElroy, Shannon, PA-C.   Past Medical History:  Diagnosis Date  . Anxiety   . Arthritis   . CAD (coronary artery disease)    Moderate LAD disease 2016 - Dr. Jacinto HalimGanji  . Chronic bronchitis (HCC)   . Chronic upper back pain   . COPD (chronic obstructive pulmonary disease) (HCC)   . Depression   . Diabetic peripheral neuropathy (HCC)   . GERD (gastroesophageal reflux disease)   . History of gout   . Hyperlipemia   . Hypertension   . Migraine   . Noncompliance   . 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 Sleep apnea 2016   "could not afford CPAP".  . Type 2 diabetes mellitus (HCC)     Past Surgical History:  Procedure Laterality Date  . ANKLE SURGERY Right 1982   "had extra bones in there; took them out"  . APPENDECTOMY  1975  . BIOPSY  12/26/2018   Procedure: BIOPSY;  Surgeon: Malissa Hippoehman, Najeeb U, MD;  Location: [facility];  Service: Endoscopy;;  duodenal biopsies  . BIOPSY  03/23/2019   Procedure: BIOPSY;  Surgeon: Malissa Hippoehman, Najeeb U, MD;  Location: [facility];  Service: Endoscopy;;  esophagus  . CARDIAC CATHETERIZATION N/A 03/28/2015   Procedure: Left Heart Cath and Coronary Angiography;  Surgeon: Yates DecampJay Ganji, MD;  [facility];  Service: Cardiovascular;  Laterality: N/A;  . CARDIAC CATHETERIZATION N/A 03/28/2015   Procedure: Intravascular Pressure Wire/FFR Study;  Surgeon: Yates DecampJay Ganji, MD;  [facility];  Service: Cardiovascular;  Laterality: N/A;  . CARPAL TUNNEL RELEASE Left ~ 2008  . COLONOSCOPY WITH ESOPHAGOGASTRODUODENOSCOPY (EGD)    . COLONOSCOPY WITH PROPOFOL N/A 12/24/2019   Procedure:  COLONOSCOPY WITH PROPOFOL;  Surgeon: Malissa Hippoehman, Najeeb U, MD;  [facility]: [facility];  Service: Endoscopy;  Laterality: N/A;  955  . ELBOW FRACTURE SURGERY Left ~ 2008  . ESOPHAGOGASTRODUODENOSCOPY N/A 12/26/2018   Procedure: ESOPHAGOGASTRODUODENOSCOPY (EGD);  Surgeon: Malissa Hippoehman, Najeeb U, MD;  [facility]: [facility];  Service: Endoscopy;  Laterality: N/A;  . ESOPHAGOGASTRODUODENOSCOPY (EGD) WITH PROPOFOL N/A 03/23/2019   Procedure: ESOPHAGOGASTRODUODENOSCOPY (EGD) WITH PROPOFOL;  Surgeon: Malissa Hippoehman, Najeeb U, MD;  Location: [facility];  Service: Endoscopy;  Laterality: N/A;  7:30  . FRACTURE SURGERY     ankle and elbow  . PILONIDAL CYST EXCISION N/A 03/24/2017   Procedure: EXCISION CHRONIC  PILONIDAL ABSCESS;  Surgeon: [name] MiyamotoBlackman, Douglas, MD;  Location: [facility];  Service: General;  Laterality: N/A;  . TENDON REPAIR Left ~ 2004   "main tendon in my ankle"    Social History   Socioeconomic History  . Marital status: Single    Spouse name: Not on file  . Number of children: 0  . Years of education: 10th g  . Highest education level: Not on file  Occupational History  . Occupation: [employer]  Tobacco Use  . Smoking status: Current Every Day Smoker    Packs/day: 0.50    Years: 34.00    Pack years: 17.00    Types: Cigarettes  . Smokeless tobacco: Never Used  . Tobacco comment: 1/2 pack a day  Vaping Use  . Vaping Use: Never used  Substance and Sexual Activity  . Alcohol use: Never    Alcohol/week: 0.0 standard drinks  . Drug use: Never  . Sexual activity: Not Currently  Other Topics Concern  . Not on file  Social History Narrative   His main caretaker, his mother has terminal metastatic cancer.   Social Determinants of Health   Financial Resource Strain: Not on file  Food Insecurity: Not on file  Transportation Needs: Not on file  Physical Activity: Not on file  Stress: Not on file  Social Connections: Not on file    Family History  Problem Relation Age of  Onset  . Congestive Heart Failure Sister   . Diabetes Mother   . Hypertension Mother   . Cancer Mother   . Diabetes Father   . Hypertension Father   . Hyperlipidemia Father     Outpatient Encounter Medications as of 05/08/2020  Medication Sig  . acetaminophen (TYLENOL) 325 MG tablet Take 2 tablets (650 mg total) by mouth every 6 (six) hours as needed for moderate pain. (Patient taking differently: Take 650-975 mg by mouth every 6 (six) hours as needed for moderate pain. )  . ARIPiprazole (ABILIFY) 5 MG tablet Take 5 mg by mouth daily.  Marland Kitchen aspirin 81 MG tablet Take 1 tablet (81 mg total) by mouth at bedtime.  Marland Kitchen atorvastatin (LIPITOR) 80 MG tablet TAKE 1 Tablet BY MOUTH ONCE EVERY DAY  . Carboxymethylcellul-Glycerin (LUBRICATING EYE DROPS OP) Place 1 drop into both eyes daily as needed (itching eyes).  . DULERA 100-5 MCG/ACT AERO INHALE 2 PUFFS BY MOUTH TWICE DAILY. RINSE MOUTH AFTER EACH USE (Patient taking differently: Inhale 2 puffs into the lungs 2 (two) times daily. )  . ibuprofen (ADVIL) 200 MG tablet Take 400-600 mg by mouth every 6 (six) hours as needed for moderate pain.  Marland Kitchen insulin aspart (NOVOLOG) 100 UNIT/ML injection Inject 14-20 Units into the skin 3 (three) times daily before meals.  . insulin glargine (LANTUS SOLOSTAR) 100 UNIT/ML Solostar Pen Inject 60 Units into the skin at bedtime.  . isosorbide mononitrate (IMDUR) 30 MG 24 hr tablet Take 1 tablet (30 mg total) by mouth daily.  . metFORMIN (GLUCOPHAGE) 500 MG tablet Take 1 tablet (500 mg total) by mouth 2 (two) times daily with a meal.  . metoCLOPramide (REGLAN) 5 MG tablet Take 1 tablet (5 mg total) by mouth 4 (four) times daily -  before meals and at bedtime. (Patient taking differently: Take 5 mg by mouth 2 (two) times daily as needed for nausea or vomiting. )  . nitroGLYCERIN (NITROSTAT) 0.4 MG SL tablet Place 1 tablet (0.4 mg total) under the tongue every 5 (five) minutes as needed for chest pain.  . pantoprazole  (PROTONIX) 40 MG tablet Take 1 tablet (40 mg total) by mouth 2 (two) times daily before a meal.  . promethazine (PHENERGAN) 25 MG suppository Place 1 suppository (25 mg total) rectally every 8 (eight) hours as needed for refractory nausea / vomiting.  . promethazine (PHENERGAN) 25 MG tablet Take 25 mg by mouth every 8 (eight) hours as needed for nausea or vomiting.  Marland Kitchen PROVENTIL HFA 108 (90 Base) MCG/ACT inhaler  INHALE 2 PUFFS BY MOUTH EVERY 6 HOURS AS NEEDED FOR COUGHING, WHEEZING, OR SHORTNESS OF BREATH  . sertraline (ZOLOFT) 100 MG tablet Take 1 tablet by mouth daily.  Monte Fantasia INHUB 100-50 MCG/DOSE AEPB INHALE 1 PUFF BY MOUTH TWICE DAILY (EVERY 12 HOURS). RINSE MOUTH AFTER EACH USE.  . [DISCONTINUED] insulin aspart (NOVOLOG) 100 UNIT/ML injection Inject 10-16 Units into the skin 3 (three) times daily before meals.  . [DISCONTINUED] insulin glargine (LANTUS SOLOSTAR) 100 UNIT/ML Solostar Pen Inject 40 Units into the skin at bedtime.   No facility-administered encounter medications on file as of 05/08/2020.    ALLERGIES: Allergies  Allergen Reactions  . Omeprazole Magnesium Swelling    Face swells, no breathing impairment  . Bee Venom   . Esomeprazole Swelling    Face swells, no breathing impairment    VACCINATION STATUS: Immunization History  Administered Date(s) Administered  . Influenza-Unspecified 03/13/2019  . Moderna SARS-COVID-2 Vaccination 09/11/2019, 10/11/2019  . Pneumococcal Polysaccharide-23 05/25/2017    Diabetes He presents for his follow-up diabetic visit. He has type 2 diabetes mellitus. Onset time: He was diagnosed at approximate age of 39 years. His disease course has been worsening. There are no hypoglycemic associated symptoms. Pertinent negatives for hypoglycemia include no confusion, headaches, pallor or seizures. There are no diabetic associated symptoms. Pertinent negatives for diabetes include no chest pain, no fatigue, no polydipsia, no polyphagia, no polyuria  and no weakness. There are no hypoglycemic complications. Symptoms are worsening. Diabetic complications include heart disease. Risk factors for coronary artery disease include diabetes mellitus, dyslipidemia, male sex, hypertension, family history, tobacco exposure and sedentary lifestyle. Current diabetic treatment includes insulin injections. His weight is decreasing steadily. He is following a generally unhealthy diet. When asked about meal planning, he reported none. He rarely participates in exercise. His home blood glucose trend is increasing steadily. His breakfast blood glucose range is generally >200 mg/dl. His lunch blood glucose range is generally >200 mg/dl. His dinner blood glucose range is generally >200 mg/dl. His bedtime blood glucose range is generally >200 mg/dl. His overall blood glucose range is >200 mg/dl. (He brings ia  Meter and logs showing significantly above target glycemic profile, 100% above target on his pro libre sensor.  He also brought his logs showing his insulin administration records.) An ACE inhibitor/angiotensin II receptor blocker is being taken. He does not see a podiatrist.Eye exam is not current.  Hyperlipidemia This is a chronic problem. The current episode started more than 1 year ago. The problem is uncontrolled. Exacerbating diseases include diabetes. Pertinent negatives include no chest pain, myalgias or shortness of breath. Risk factors for coronary artery disease include dyslipidemia, diabetes mellitus, family history, male sex, hypertension and a sedentary lifestyle.  Hypertension This is a chronic problem. The current episode started more than 1 year ago. The problem is controlled. Pertinent negatives include no chest pain, headaches, neck pain, palpitations or shortness of breath. Risk factors for coronary artery disease include diabetes mellitus, dyslipidemia, family history, male gender, sedentary lifestyle and smoking/tobacco exposure. Past treatments  include direct vasodilators. Hypertensive end-organ damage includes CAD/MI.     Review of Systems  Constitutional: Negative for chills, fatigue, fever and unexpected weight change.  HENT: Negative for dental problem, mouth sores and trouble swallowing.   Eyes: Negative for visual disturbance.  Respiratory: Negative for cough, choking, chest tightness, shortness of breath and wheezing.   Cardiovascular: Negative for chest pain, palpitations and leg swelling.  Gastrointestinal: Negative for abdominal distention, abdominal pain, constipation, diarrhea,  nausea and vomiting.  Endocrine: Negative for polydipsia, polyphagia and polyuria.  Genitourinary: Negative for dysuria, flank pain, hematuria and urgency.  Musculoskeletal: Negative for back pain, gait problem, myalgias and neck pain.  Skin: Negative for pallor, rash and wound.  Neurological: Negative for seizures, syncope, weakness, numbness and headaches.  Psychiatric/Behavioral: Negative for confusion and dysphoric mood.    Objective:    Vitals with BMI 05/08/2020 04/10/2020 04/09/2020  Height 5\' 8"  5\' 8"  5\' 8"   Weight 169 lbs 8 oz 171 lbs 173 lbs  BMI 25.78 26.01 26.31  Systolic 130 108 -  Diastolic 84 70 -  Pulse 114 98 -    BP 130/84   Pulse (!) 114   Ht 5\' 8"  (1.727 m)   Wt 169 lb 8 oz (76.9 kg)   BMI 25.77 kg/m   Wt Readings from Last 3 Encounters:  05/08/20 169 lb 8 oz (76.9 kg)  04/10/20 171 lb (77.6 kg)  04/09/20 173 lb (78.5 kg)     Physical Exam Constitutional:      General: He is not in acute distress.    Appearance: He is well-developed.  HENT:     Head: Normocephalic and atraumatic.  Neck:     Thyroid: No thyromegaly.     Trachea: No tracheal deviation.  Cardiovascular:     Rate and Rhythm: Normal rate.     Pulses:          Dorsalis pedis pulses are 1+ on the right side and 1+ on the left side.       Posterior tibial pulses are 1+ on the right side and 1+ on the left side.     Heart sounds: Normal  heart sounds, S1 normal and S2 normal. No murmur heard. No gallop.   Pulmonary:     Effort: Pulmonary effort is normal. No respiratory distress.     Breath sounds: Normal breath sounds. No wheezing.  Abdominal:     General: There is no distension.     Tenderness: There is no abdominal tenderness. There is no guarding.  Musculoskeletal:     Right shoulder: No swelling or deformity.     Cervical back: Normal range of motion and neck supple.  Skin:    General: Skin is warm and dry.     Findings: No rash.     Nails: There is no clubbing.  Neurological:     Mental Status: He is alert and oriented to person, place, and time.     Cranial Nerves: No cranial nerve deficit.     Sensory: No sensory deficit.     Gait: Gait normal.     Deep Tendon Reflexes: Reflexes are normal and symmetric.  Psychiatric:        Speech: Speech normal.        Behavior: Behavior normal. Behavior is cooperative.        Thought Content: Thought content normal.        Judgment: Judgment normal.      CMP     Component Value Date/Time   NA 137 04/07/2020 0950   K 4.2 04/07/2020 0950   CL 103 04/07/2020 0950   CO2 26 04/07/2020 0950   GLUCOSE 200 (H) 04/07/2020 0950   BUN 13 04/07/2020 0950   CREATININE 0.87 04/07/2020 0950   CREATININE 1.05 01/23/2019 0927   CALCIUM 8.8 (L) 04/07/2020 0950   PROT 6.5 04/07/2020 0950   ALBUMIN 3.3 (L) 04/07/2020 0950   AST 17 04/07/2020 0950   ALT 16  04/07/2020 0950   ALKPHOS 79 04/07/2020 0950   BILITOT 0.7 04/07/2020 0950   GFRNONAA >60 04/07/2020 0950   GFRNONAA 83 01/23/2019 0927   GFRAA >60 01/11/2020 0859   GFRAA 96 01/23/2019 0927     Diabetic Labs (most recent): Lab Results  Component Value Date   HGBA1C 14.6 (A) 05/08/2020   HGBA1C 14.6 (H) 01/11/2020   HGBA1C 15.2 (H) 08/31/2019     Lipid Panel ( most recent) Lipid Panel     Component Value Date/Time   CHOL 164 04/07/2020 0950   TRIG 149 04/07/2020 0950   HDL 36 (L) 04/07/2020 0950   CHOLHDL  4.6 04/07/2020 0950   VLDL 30 04/07/2020 0950   LDLCALC 98 04/07/2020 0950   LDLDIRECT 126.6 (H) 04/24/2019 1318      Lab Results  Component Value Date   TSH 1.772 05/24/2017   TSH 4.778 (H) 11/25/2015     Assessment & Plan:   1. Uncontrolled type 2 diabetes mellitus with hyperglycemia (HCC)   - [name] [name] has currently uncontrolled symptomatic type 2 DM since  49 years of age. Recent labs reviewed. He brings ia  Meter and logs showing significantly above target glycemic profile, 100% above target on his pro libre sensor.  He also brought his logs showing his insulin administration records.   - I had a long discussion with him about the progressive nature of diabetes and the pathology behind its complications. -his diabetes is complicated by coronary artery disease and he remains at a high risk for more acute and chronic complications which include CAD, CVA, CKD, retinopathy, and neuropathy. These are all discussed in detail with him.  - I have counseled him on diet  and weight management  by adopting a carbohydrate restricted/protein rich diet. Patient is encouraged to switch to  unprocessed or minimally processed     complex starch and increased protein intake (animal or plant source), fruits, and vegetables. -  he is advised to stick to a routine mealtimes to eat 3 meals  a day and avoid unnecessary snacks ( to snack only to correct hypoglycemia).   .- he acknowledges that there is a room for improvement in his food and drink choices. - Suggestion is made for him to avoid simple carbohydrates  from his diet including Cakes, Sweet Desserts, Ice Cream, Soda (diet and regular), Sweet Tea, Candies, Chips, Cookies, Store Bought Juices, Alcohol in Excess of  1-2 drinks a day, Artificial Sweeteners,  Coffee Creamer, and "Sugar-free" Products, Lemonade. This will help patient to have more stable blood glucose profile and potentially avoid unintended weight gain.  - he has been  scheduled with Norm Salt, RDN, CDE for diabetes education.  - I have approached him with the following individualized plan to manage  his diabetes and patient agrees:   -In light of his current and prevailing glycemic burden, he will continue to require intensive treatment with a higher dose of basal/bolus insulin in order for him to achieve and maintain control of diabetes to target. -Accordingly, he is advised to increase Lantus to 60 units nightly, increase NovoLog to 14 units 3 times a day with meals  for pre-meal BG readings of 90-150mg /dl, plus patient specific correction dose for unexpected hyperglycemia above 150mg /dl, associated with strict monitoring of glucose 4 times a day-before meals and at bedtime. - he is warned not to take insulin without proper monitoring per orders. -He wears Libre sensor pro, will bring his sensors every 10 days for download.  He could not afford the regular Libre 2. - Adjustment parameters are given to him for hypo and hyperglycemia in writing. - he is encouraged to call clinic for blood glucose levels less than 70 or above 300 mg /dl. - he is advised to continue Metformin 500 mg p.o. twice daily, therapeutically suitable for patient .  - he is not a candidate for incretin therapy due to his current heavy smoking, at risk for pancreatitis.    - Specific targets for  A1c;  LDL, HDL,  and Triglycerides were discussed with the patient.  2) Blood Pressure /Hypertension:  -His blood pressure is controlled to target.  he is advised to continue his current medications including Imdur 30 mg p.o. daily with breakfast . 3) Lipids/Hyperlipidemia:   Review of his recent lipid panel showed un controlled  LDL at 126 .  he  is advised to continue atorvastatin 80 mg p.o. nightly.   Side effects and precautions discussed with him.  4)  Weight/Diet:  Body mass index is 25.77 kg/m.  -  he is  a candidate for some weight loss. I discussed with him the fact that loss of 5  - 10% of his  current body weight will have the most impact on his diabetes management.  Exercise, and detailed carbohydrates information provided  -  detailed on discharge instructions.  5) Chronic Care/Health Maintenance:  -he  is on Statin medications and  is encouraged to initiate and continue to follow up with Ophthalmology, Dentist,  Podiatrist at least yearly or according to recommendations, and advised to  quit smoking. I have recommended yearly flu vaccine and pneumonia vaccine at least every 5 years; moderate intensity exercise for up to 150 minutes weekly; and  sleep for at least 7 hours a day.   The patient was counseled on the dangers of tobacco use, and was advised to quit.  Reviewed strategies to maximize success, including removing cigarettes and smoking materials from environment.  POCT ABI Results 05/08/20  His ABIs normal today Right ABI: 1.15      left ABI: 1.2  Right leg systolic / diastolic: 150/102 mmHg Left leg systolic / diastolic: 156/93 mmHg  Arm systolic / diastolic: 130/84 mmHG This study will be repeated in December 2026, or sooner if needed.  - he is  advised to maintain close follow up with [name], PA-C for primary care needs, as well as his other providers for optimal and coordinated care.   - Time spent on this patient care encounter: 40 min, of which > 50% was spent in  counseling and the rest reviewing his blood glucose logs , discussing his hypoglycemia and hyperglycemia episodes, reviewing his current and  previous labs / studies  ( including abstraction from other facilities) and medications  doses and developing a  long term treatment plan and documenting his care.   Please refer to Patient Instructions for Blood Glucose Monitoring and Insulin/Medications Dosing Guide"  in media tab for additional information. Please  also refer to " Patient Self Inventory" in the Media  tab for reviewed elements of pertinent patient history.  [name] [name]  participated in the discussions, expressed understanding, and voiced agreement with the above plans.  All questions were answered to his satisfaction. he is encouraged to contact clinic should he have any questions or concerns prior to his return visit.    Follow up plan: Return in 7 days for reevaluation. Marquis Lunch, MD [facility] [phone]  [address] [place], [place] [address] Phone: [phone]  Fax: [phone]    05/08/2020, 1:48 PM  This note was partially dictated with voice recognition software. Similar sounding words can be transcribed inadequately or may not  be corrected upon review.

## 2020-05-08 NOTE — Progress Notes (Signed)
Medical Nutrition Therapy:  Appt start time: 1350 end time: 1415  Assessment:  Primary concerns today: Diabetes Type 2. [name] [name] lives with his mom and dad. Goes to the [employer].   Saw Dr. [name] today. Increased Lantus to 60 units and Novolog 14 units with meals plus sliding scale. Drinking more water. Eating more chicken and his mom bough more cauliflower, broccoli and asparagus.  Urine is getting more clear and less yellow.  Recent labs: 04/07/20  TCHOL 164 mg/dl, TG 466 mg/dl, HDL 36 mg/dl LDL 70.  A1C 14.6%. in 12/2019.  Put on a libre Pro to use. WIll read when [name] [name] gets back for next appt.  [name] [name] is willing to work on eating better balanced meals and cut out all processed foods, sweets, concentrated sweet beverages.   Continues to deal with depression issues.  [name] [name] is being followed and assisted as needed with [facility]. [name] [name] does have therapist [name] [name] works with but still has a lot of depression issues.    Previous goals set  Do not skip meals-trying to do better. Eat breakfast by 8 am, lunch 12-2 and dinner 5-7 pm- work in progress. Do not take meal insulin without eating a meal-needs to do better. Drink only water- increased some. Avoid snacks-cut down on that. Test blood sugars before meals and take insuln according to sliding scale. : .  Lab Results  Component Value Date   HGBA1C 14.6 (A) 05/08/2020   CMP Latest Ref Rng & Units 04/07/2020 01/11/2020 12/21/2019  Glucose 70 - 99 mg/dL 599(J) 570(V) 779(T)  BUN 6 - 20 mg/dL 13 90(Z) 20  Creatinine 0.61 - 1.24 mg/dL 0.09 2.33 0.07  Sodium 135 - 145 mmol/L 137 133(L) 136  Potassium 3.5 - 5.1 mmol/L 4.2 4.4 4.4  Chloride 98 - 111 mmol/L 103 101 102  CO2 22 - 32 mmol/L 26 22 23   Calcium 8.9 - 10.3 mg/dL ) 8.9 6.2(U)  Total Protein 6.5 - 8.1 g/dL 6.5 7.3 -  Total Bilirubin 0.3 - 1.2 mg/dL 0.7 0.4 -  Alkaline Phos 38 - 126 U/L 79 92 -  AST 15 - 41 U/L 17 16 -  ALT 0 - 44 U/L 16 18 -   Wt Readings from Last 3 Encounters:   05/08/20 169 lb 8 oz (76.9 kg)  04/10/20 171 lb (77.6 kg)  04/09/20 173 lb (78.5 kg)   Ht Readings from Last 3 Encounters:  05/08/20 5\' 8"  (1.727 m)  04/10/20 5\' 8"  (1.727 m)  04/09/20 5\' 8"  (1.727 m)   There is no height or weight on file to calculate BMI. @BMIFA @ Facility age limit for growth percentiles is 20 years. Facility age limit for growth percentiles is 20 years.  Preferred Learning Style:   Auditory  Visual--likes to see stuff but limited reading ability.  Hands on  Learning Readiness:   Ready  Change in progress   MEDICATIONS: see list    DIETARY INTAKE:    Breakfast 2 eggs and sausage Lunch PIzza bites,  Dinner: CHicken, green peas and corn.  Dinner: 13/11/21, creamed corn, water   Usual physical activity:  ADL   Estimated energy needs: 2000  calories 225 g carbohydrates 150 g protein 50 g fat  Progress Towards Goal(s):  In progress.   Nutritional Diagnosis:  NB-1.1 Food and nutrition-related knowledge deficit As related to Diabetes Type 2.  As evidenced by A1C 14%.    Intervention:  Nutrition and Diabetes education provided on My Plate, CHO counting, meal planning,  portion sizes, timing of meals, avoiding snacks between meals unless having a low blood sugar, target ranges for A1C and blood sugars, signs/symptoms and treatment of hyper/hypoglycemia, monitoring blood sugars, taking medications as prescribed, benefits of exercising 30 minutes per day and prevention of complications of DM.   Goals  Test blood sugars and give insulin before meals not after. 60 units of Lantus at night Increase Novolog to 14 units plus sliding scale. Keep drinking more water Cut processed foods of pizza bites. Increase fresh fruits and vegetables. Get A1C down to 7%  Teaching Method Utilized:  Visual Auditory Hands on  Handouts given during visit include:  The Plate Method  Meal Plan Card   Barriers to learning/adherence to lifestyle change:  Depression, limited reading  Demonstrated degree of understanding via:  Teach Back   Monitoring/Evaluation:  Dietary intake, exercise,  and body weight in 2 weeks (s).

## 2020-05-08 NOTE — Patient Instructions (Signed)
                                     Advice for Weight Management  -For most of us the best way to lose weight is by diet management. Generally speaking, diet management means consuming less calories intentionally which over time brings about progressive weight loss.  This can be achieved more effectively by restricting carbohydrate consumption to the minimum possible.  So, it is critically important to know your numbers: how much calorie you are consuming and how much calorie you need. More importantly, our carbohydrates sources should be unprocessed or minimally processed complex starch food items.   Sometimes, it is important to balance nutrition by increasing protein intake (animal or plant source), fruits, and vegetables.  -Sticking to a routine mealtime to eat 3 meals a day and avoiding unnecessary snacks is shown to have a big role in weight control. Under normal circumstances, the only time we lose real weight is when we are hungry, so allow hunger to take place- hunger means no food between meal times, only water.  It is not advisable to starve.   -It is better to avoid simple carbohydrates including: Cakes, Sweet Desserts, Ice Cream, Soda (diet and regular), Sweet Tea, Candies, Chips, Cookies, Store Bought Juices, Alcohol in Excess of  1-2 drinks a day, Lemonade,  Artificial Sweeteners, Doughnuts, Coffee Creamers, "Sugar-free" Products, etc, etc.  This is not a complete list.....    -Consulting with certified diabetes educators is proven to provide you with the most accurate and current information on diet.  Also, you may be  interested in discussing diet options/exchanges , we can schedule a visit with [name] [name], RDN, CDE for individualized nutrition education.  -Exercise: If you are able: 30 -60 minutes a day ,4 days a week, or 150 minutes a week.  The longer the better.  Combine stretch, strength, and aerobic activities.  If you were told in the  past that you have high risk for cardiovascular diseases, you may seek evaluation by your heart doctor prior to initiating moderate to intense exercise programs.                                  Additional Care Considerations for Diabetes   -Diabetes  is a chronic disease.  The most important care consideration is regular follow-up with your diabetes care provider with the goal being avoiding or delaying its complications and to take advantage of advances in medications and technology.    -Type 2 diabetes is known to coexist with other important comorbidities such as high blood pressure and high cholesterol.  It is critical to control not only the diabetes but also the high blood pressure and high cholesterol to minimize and delay the risk of complications including coronary artery disease, stroke, amputations, blindness, etc.    - Studies showed that people with diabetes will benefit from a class of medications known as ACE inhibitors and statins.  Unless there are specific reasons not to be on these medications, the standard of care is to consider getting one from these groups of medications at an optimal doses.  These medications are generally considered safe and proven to help protect the heart and the kidneys.    - People with diabetes are encouraged to initiate and maintain regular follow-up with eye doctors, foot   doctors, dentists , and if necessary heart and kidney doctors.     - It is highly recommended that people with diabetes quit smoking or stay away from smoking, and get yearly  flu vaccine and pneumonia vaccine at least every 5 years.  One other important lifestyle recommendation is to ensure adequate sleep - at least 6-7 hours of uninterrupted sleep at night.  -Exercise: If you are able: 30 -60 minutes a day, 4 days a week, or 150 minutes a week.  The longer the better.  Combine stretch, strength, and aerobic activities.  If you were told in the past that you have high risk for  cardiovascular diseases, you may seek evaluation by your heart doctor prior to initiating moderate to intense exercise programs.          

## 2020-05-08 NOTE — Patient Instructions (Signed)
Goals  Test blood sugars and give insulin before meals not after. 60 units of Lantus at night Increase Novolog to 14 units plus sliding scale. Keep drinking more water Cut processed foods of pizza bites. Increase fresh fruits and vegetables. Get A1C down to 7%

## 2020-05-15 ENCOUNTER — Encounter: Payer: Self-pay | Admitting: Nutrition

## 2020-05-20 ENCOUNTER — Telehealth: Payer: Self-pay

## 2020-05-20 NOTE — Telephone Encounter (Signed)
Attempted to call client for follow up. No answer, left message requesting return call.   [name] RN [facility]

## 2020-05-27 ENCOUNTER — Ambulatory Visit (INDEPENDENT_AMBULATORY_CARE_PROVIDER_SITE_OTHER): Payer: Self-pay | Admitting: "Endocrinology

## 2020-05-27 ENCOUNTER — Other Ambulatory Visit: Payer: Self-pay

## 2020-05-27 ENCOUNTER — Encounter: Payer: Self-pay | Admitting: "Endocrinology

## 2020-05-27 VITALS — BP 100/68 | HR 104 | Ht 68.0 in | Wt 172.0 lb

## 2020-05-27 DIAGNOSIS — F17200 Nicotine dependence, unspecified, uncomplicated: Secondary | ICD-10-CM

## 2020-05-27 DIAGNOSIS — Z91199 Patient's noncompliance with other medical treatment and regimen due to unspecified reason: Secondary | ICD-10-CM | POA: Insufficient documentation

## 2020-05-27 DIAGNOSIS — E1165 Type 2 diabetes mellitus with hyperglycemia: Secondary | ICD-10-CM

## 2020-05-27 DIAGNOSIS — E782 Mixed hyperlipidemia: Secondary | ICD-10-CM

## 2020-05-27 DIAGNOSIS — Z9119 Patient's noncompliance with other medical treatment and regimen: Secondary | ICD-10-CM

## 2020-05-27 MED ORDER — GLUCOSE BLOOD VI STRP: ORAL_STRIP | 2 refills | Status: DC

## 2020-05-27 MED ORDER — RELION ALL-IN-ONE DEVI: 0 refills | Status: DC

## 2020-05-27 NOTE — Patient Instructions (Signed)

## 2020-05-27 NOTE — Progress Notes (Signed)
05/27/2020, 12:37 PM  Endocrinology follow-up note   Subjective:    Patient ID: [name] [name], male    DOB: 06-06-1970.  [name] [name] is being seen in follow-up after he was seen in consultation for management of currently uncontrolled symptomatic diabetes requested by  Jacquelin HawkingMcElroy, Shannon, PA-C.   Past Medical History:  Diagnosis Date  . Anxiety   . Arthritis   . CAD (coronary artery disease)    Moderate LAD disease 2016 - Dr. Jacinto HalimGanji  . Chronic bronchitis (HCC)   . Chronic upper back pain   . COPD (chronic obstructive pulmonary disease) (HCC)   . Depression   . Diabetic peripheral neuropathy (HCC)   . GERD (gastroesophageal reflux disease)   . History of gout   . Hyperlipemia   . Hypertension   . Migraine   . Noncompliance   . 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 Sleep apnea 2016   "could not afford CPAP".  . Type 2 diabetes mellitus (HCC)     Past Surgical History:  Procedure Laterality Date  . ANKLE SURGERY Right 1982   "had extra bones in there; took them out"  . APPENDECTOMY  1975  . BIOPSY  12/26/2018   Procedure: BIOPSY;  Surgeon: Malissa Hippoehman, Najeeb U, MD;  [facility];  Service: Endoscopy;;  duodenal biopsies  . BIOPSY  03/23/2019   Procedure: BIOPSY;  Surgeon: Malissa Hippoehman, Najeeb U, MD;  Location: [facility];  Service: Endoscopy;;  esophagus  . CARDIAC CATHETERIZATION N/A 03/28/2015   Procedure: Left Heart Cath and Coronary Angiography;  Surgeon: Yates DecampJay Ganji, MD;  [facility];  Service: Cardiovascular;  Laterality: N/A;  . CARDIAC CATHETERIZATION N/A 03/28/2015   Procedure: Intravascular Pressure Wire/FFR Study;  Surgeon: Yates DecampJay Ganji, MD;  [facility];  Service: Cardiovascular;  Laterality: N/A;  . CARPAL TUNNEL RELEASE Left ~ 2008  . COLONOSCOPY WITH ESOPHAGOGASTRODUODENOSCOPY (EGD)    . COLONOSCOPY WITH PROPOFOL N/A 12/24/2019   Procedure:  COLONOSCOPY WITH PROPOFOL;  Surgeon: Malissa Hippoehman, Najeeb U, MD;  [facility]: [facility];  Service: Endoscopy;  Laterality: N/A;  955  . ELBOW FRACTURE SURGERY Left ~ 2008  . ESOPHAGOGASTRODUODENOSCOPY N/A 12/26/2018   Procedure: ESOPHAGOGASTRODUODENOSCOPY (EGD);  Surgeon: Malissa Hippoehman, Najeeb U, MD;  [facility];  Service: Endoscopy;  Laterality: N/A;  . ESOPHAGOGASTRODUODENOSCOPY (EGD) WITH PROPOFOL N/A 03/23/2019   Procedure: ESOPHAGOGASTRODUODENOSCOPY (EGD) WITH PROPOFOL;  Surgeon: Malissa Hippoehman, Najeeb U, MD;  [facility]: [facility];  Service: Endoscopy;  Laterality: N/A;  7:30  . FRACTURE SURGERY     ankle and elbow  . PILONIDAL CYST EXCISION N/A 03/24/2017   Procedure: EXCISION CHRONIC  PILONIDAL ABSCESS;  Surgeon: [name] MiyamotoBlackman, Douglas, MD;  Location: [facility];  Service: General;  Laterality: N/A;  . TENDON REPAIR Left ~ 2004   "main tendon in my ankle"    Social History   Socioeconomic History  . Marital status: Single    Spouse name: Not on file  . Number of children: 0  . Years of education: 10th g  . Highest education level: Not on file  Occupational History  . Occupation: [employer]  Tobacco Use  . Smoking status: Current Every Day Smoker    Packs/day: 0.50    Years: 34.00    Pack years: 17.00    Types: Cigarettes  . Smokeless tobacco: Never Used  . Tobacco comment: 1/2 pack a day  Vaping Use  . Vaping Use: Never used  Substance and Sexual Activity  . Alcohol use: Never    Alcohol/week: 0.0 standard drinks  . Drug use: Never  . Sexual activity: Not Currently  Other Topics Concern  . Not on file  Social History Narrative   His main caretaker, his mother has terminal metastatic cancer.   Social Determinants of Health   Financial Resource Strain: Not on file  Food Insecurity: Not on file  Transportation Needs: Not on file  Physical Activity: Not on file  Stress: Not on file  Social Connections: Not on file    Family History  Problem Relation Age of  Onset  . Congestive Heart Failure Sister   . Diabetes Mother   . Hypertension Mother   . Cancer Mother   . Diabetes Father   . Hypertension Father   . Hyperlipidemia Father     Outpatient Encounter Medications as of 05/27/2020  Medication Sig  . Blood Gluc Meter Disp-Strips (RELION ALL-IN-ONE) DEVI Use to test glucose 4 times a day  . glucose blood test strip Use as instructed  . acetaminophen (TYLENOL) 325 MG tablet Take 2 tablets (650 mg total) by mouth every 6 (six) hours as needed for moderate pain. (Patient taking differently: Take 650-975 mg by mouth every 6 (six) hours as needed for moderate pain. )  . ARIPiprazole (ABILIFY) 5 MG tablet Take 5 mg by mouth daily.  Marland Kitchen aspirin 81 MG tablet Take 1 tablet (81 mg total) by mouth at bedtime.  Marland Kitchen atorvastatin (LIPITOR) 80 MG tablet TAKE 1 Tablet BY MOUTH ONCE EVERY DAY  . Carboxymethylcellul-Glycerin (LUBRICATING EYE DROPS OP) Place 1 drop into both eyes daily as needed (itching eyes).  . DULERA 100-5 MCG/ACT AERO INHALE 2 PUFFS BY MOUTH TWICE DAILY. RINSE MOUTH AFTER EACH USE (Patient taking differently: Inhale 2 puffs into the lungs 2 (two) times daily. )  . ibuprofen (ADVIL) 200 MG tablet Take 400-600 mg by mouth every 6 (six) hours as needed for moderate pain.  Marland Kitchen insulin aspart (NOVOLOG) 100 UNIT/ML injection Inject 14-20 Units into the skin 3 (three) times daily before meals.  . insulin glargine (LANTUS SOLOSTAR) 100 UNIT/ML Solostar Pen Inject 60 Units into the skin at bedtime.  . isosorbide mononitrate (IMDUR) 30 MG 24 hr tablet Take 1 tablet (30 mg total) by mouth daily.  . metFORMIN (GLUCOPHAGE) 500 MG tablet Take 1 tablet (500 mg total) by mouth 2 (two) times daily with a meal.  . metoCLOPramide (REGLAN) 5 MG tablet Take 1 tablet (5 mg total) by mouth 4 (four) times daily -  before meals and at bedtime. (Patient taking differently: Take 5 mg by mouth 2 (two) times daily as needed for nausea or vomiting. )  . nitroGLYCERIN (NITROSTAT)  0.4 MG SL tablet Place 1 tablet (0.4 mg total) under the tongue every 5 (five) minutes as needed for chest pain.  . pantoprazole (PROTONIX) 40 MG tablet Take 1 tablet (40 mg total) by mouth 2 (two) times daily before a meal.  . promethazine (PHENERGAN) 25 MG suppository Place 1 suppository (25 mg total) rectally every 8 (eight) hours as needed for refractory nausea / vomiting.  . promethazine (PHENERGAN) 25  MG tablet Take 25 mg by mouth every 8 (eight) hours as needed for nausea or vomiting.  Marland Kitchen PROVENTIL HFA 108 (90 Base) MCG/ACT inhaler INHALE 2 PUFFS BY MOUTH EVERY 6 HOURS AS NEEDED FOR COUGHING, WHEEZING, OR SHORTNESS OF BREATH  . sertraline (ZOLOFT) 100 MG tablet Take 1 tablet by mouth daily.  Monte Fantasia INHUB 100-50 MCG/DOSE AEPB INHALE 1 PUFF BY MOUTH TWICE DAILY (EVERY 12 HOURS). RINSE MOUTH AFTER EACH USE.   No facility-administered encounter medications on file as of 05/27/2020.    ALLERGIES: Allergies  Allergen Reactions  . Omeprazole Magnesium Swelling    Face swells, no breathing impairment  . Bee Venom   . Esomeprazole Swelling    Face swells, no breathing impairment    VACCINATION STATUS: Immunization History  Administered Date(s) Administered  . Influenza-Unspecified 03/13/2019  . Moderna Sars-Covid-2 Vaccination 09/11/2019, 10/11/2019  . Pneumococcal Polysaccharide-23 05/25/2017    Diabetes He presents for his follow-up diabetic visit. He has type 2 diabetes mellitus. Onset time: He was diagnosed at approximate age of 39 years. His disease course has been worsening. There are no hypoglycemic associated symptoms. Pertinent negatives for hypoglycemia include no confusion, headaches, pallor or seizures. There are no diabetic associated symptoms. Pertinent negatives for diabetes include no chest pain, no fatigue, no polydipsia, no polyphagia, no polyuria and no weakness. There are no hypoglycemic complications. Symptoms are worsening. Diabetic complications include heart  disease. Risk factors for coronary artery disease include diabetes mellitus, dyslipidemia, male sex, hypertension, family history, tobacco exposure and sedentary lifestyle. Current diabetic treatment includes insulin injections. His weight is decreasing steadily. He is following a generally unhealthy diet. When asked about meal planning, he reported none. He rarely participates in exercise. His home blood glucose trend is increasing steadily. His breakfast blood glucose range is generally >200 mg/dl. His lunch blood glucose range is generally >200 mg/dl. His dinner blood glucose range is generally >200 mg/dl. His bedtime blood glucose range is generally >200 mg/dl. His overall blood glucose range is >200 mg/dl. (He brings in a CGM pro which shows 100% above range.  He did not bring any logs showing his insulin administration records.  He does not have a meter nor strips to check blood glucose.) An ACE inhibitor/angiotensin II receptor blocker is being taken. He does not see a podiatrist.Eye exam is not current.  Hyperlipidemia This is a chronic problem. The current episode started more than 1 year ago. The problem is uncontrolled. Exacerbating diseases include diabetes. Pertinent negatives include no chest pain, myalgias or shortness of breath. Risk factors for coronary artery disease include dyslipidemia, diabetes mellitus, family history, male sex, hypertension and a sedentary lifestyle.  Hypertension This is a chronic problem. The current episode started more than 1 year ago. The problem is controlled. Pertinent negatives include no chest pain, headaches, neck pain, palpitations or shortness of breath. Risk factors for coronary artery disease include diabetes mellitus, dyslipidemia, family history, male gender, sedentary lifestyle and smoking/tobacco exposure. Past treatments include direct vasodilators. Hypertensive end-organ damage includes CAD/MI.     Review of Systems  Constitutional: Negative for  chills, fatigue, fever and unexpected weight change.  HENT: Negative for dental problem, mouth sores and trouble swallowing.   Eyes: Negative for visual disturbance.  Respiratory: Negative for cough, choking, chest tightness, shortness of breath and wheezing.   Cardiovascular: Negative for chest pain, palpitations and leg swelling.  Gastrointestinal: Negative for abdominal distention, abdominal pain, constipation, diarrhea, nausea and vomiting.  Endocrine: Negative for polydipsia, polyphagia and  polyuria.  Genitourinary: Negative for dysuria, flank pain, hematuria and urgency.  Musculoskeletal: Negative for back pain, gait problem, myalgias and neck pain.  Skin: Negative for pallor, rash and wound.  Neurological: Negative for seizures, syncope, weakness, numbness and headaches.  Psychiatric/Behavioral: Negative for confusion and dysphoric mood.    Objective:    Vitals with BMI 05/27/2020 05/08/2020 05/08/2020  Height     Weight 172 lbs 169 lbs 13 oz 169 lbs 8 oz  BMI 26.16 25.82 25.78  Systolic 100 - 130  Diastolic 68 - 84  Pulse 104 - 409    BP 100/68   Pulse (!) 104   Ht  (1.727 m)   Wt 172 lb (78 kg)   BMI 26.15 kg/m   Wt Readings from Last 3 Encounters:  05/27/20 172 lb (78 kg)  05/08/20 169 lb 12.8 oz (77 kg)  05/08/20 169 lb 8 oz (76.9 kg)     Physical Exam Constitutional:      General: He is not in acute distress.    Appearance: He is well-developed.  HENT:     Head: Normocephalic and atraumatic.  Neck:     Thyroid: No thyromegaly.     Trachea: No tracheal deviation.  Cardiovascular:     Rate and Rhythm: Normal rate.     Pulses:          Dorsalis pedis pulses are 1+ on the right side and 1+ on the left side.       Posterior tibial pulses are 1+ on the right side and 1+ on the left side.     Heart sounds: Normal heart sounds, S1 normal and S2 normal. No murmur heard. No gallop.   Pulmonary:     Effort: Pulmonary effort is normal. No  respiratory distress.     Breath sounds: Normal breath sounds. No wheezing.  Abdominal:     General: There is no distension.     Tenderness: There is no abdominal tenderness. There is no guarding.  Musculoskeletal:     Right shoulder: No swelling or deformity.     Cervical back: Normal range of motion and neck supple.  Skin:    General: Skin is warm and dry.     Findings: No rash.     Nails: There is no clubbing.  Neurological:     Mental Status: He is alert and oriented to person, place, and time.     Cranial Nerves: No cranial nerve deficit.     Sensory: No sensory deficit.     Gait: Gait normal.     Deep Tendon Reflexes: Reflexes are normal and symmetric.  Psychiatric:        Speech: Speech normal.        Behavior: Behavior normal. Behavior is cooperative.        Thought Content: Thought content normal.        Judgment: Judgment normal.      CMP     Component Value Date/Time   NA 137 04/07/2020 0950   K 4.2 04/07/2020 0950   CL 103 04/07/2020 0950   CO2 26 04/07/2020 0950   GLUCOSE 200 (H) 04/07/2020 0950   BUN 13 04/07/2020 0950   CREATININE 0.87 04/07/2020 0950   CREATININE 1.05 01/23/2019 0927   CALCIUM 8.8 (L) 04/07/2020 0950   PROT 6.5 04/07/2020 0950   ALBUMIN 3.3 (L) 04/07/2020 0950   AST 17 04/07/2020 0950   ALT 16 04/07/2020 0950   ALKPHOS 79 04/07/2020 0950  BILITOT 0.7 04/07/2020 0950   GFRNONAA >60 04/07/2020 0950   GFRNONAA 83 01/23/2019 0927   GFRAA >60 01/11/2020 0859   GFRAA 96 01/23/2019 0927     Diabetic Labs (most recent): Lab Results  Component Value Date   HGBA1C 14.6 (A) 05/08/2020   HGBA1C 14.6 (H) 01/11/2020   HGBA1C 15.2 (H) 08/31/2019     Lipid Panel ( most recent) Lipid Panel     Component Value Date/Time   CHOL 164 04/07/2020 0950   TRIG 149 04/07/2020 0950   HDL 36 (L) 04/07/2020 0950   CHOLHDL 4.6 04/07/2020 0950   VLDL 30 04/07/2020 0950   LDLCALC 98 04/07/2020 0950   LDLDIRECT 126.6 (H) 04/24/2019 1318       Lab Results  Component Value Date   TSH 1.772 05/24/2017   TSH 4.778 (H) 11/25/2015     Assessment & Plan:   1. Uncontrolled type 2 diabetes mellitus with hyperglycemia (HCC)   - [name] [name] has currently uncontrolled symptomatic type 2 DM since  49 years of age. Recent labs reviewed.   He brings in a CGM pro which shows 100% above range.  He did not bring any logs showing his insulin administration records.  He does not have a meter nor strips to check blood glucose.   - I had a long discussion with him about the progressive nature of diabetes and the pathology behind its complications. -his diabetes is complicated by coronary artery disease and he remains at a high risk for more acute and chronic complications which include CAD, CVA, CKD, retinopathy, and neuropathy. These are all discussed in detail with him.  - I have counseled him on diet  and weight management  by adopting a carbohydrate restricted/protein rich diet. Patient is encouraged to switch to  unprocessed or minimally processed     complex starch and increased protein intake (animal or plant source), fruits, and vegetables. -  he is advised to stick to a routine mealtimes to eat 3 meals  a day and avoid unnecessary snacks ( to snack only to correct hypoglycemia).   - he acknowledges that there is a room for improvement in his food and drink choices. - Suggestion is made for him to avoid simple carbohydrates  from his diet including Cakes, Sweet Desserts, Ice Cream, Soda (diet and regular), Sweet Tea, Candies, Chips, Cookies, Store Bought Juices, Alcohol in Excess of  1-2 drinks a day, Artificial Sweeteners,  Coffee Creamer, and "Sugar-free" Products, Lemonade. This will help patient to have more stable blood glucose profile and potentially avoid unintended weight gain.   - he has been scheduled with Norm Salt, RDN, CDE for diabetes education.  - I have approached him with the following individualized plan to  manage  his diabetes and patient agrees:   -In light of his current and prevailing glycemic burden, he will continue to require intensive treatment with a higher dose of basal/bolus insulin in order for him to achieve and maintain control of diabetes to target. -Unfortunately, he is not cooperating with documenting nor monitoring blood glucose regularly.  His CGM pro indicates 100% above target. -Accordingly, I reapproach him with a plan to start monitoring blood glucose 4 times a day-before meals and at bedtime, and resume his intensive treatment with basal/bolus insulin.  -He is advised to continue Lantus  60 units nightly, NovoLog  14 units 3 times a day with meals  for pre-meal BG readings of 90-150mg /dl, plus patient specific correction dose for unexpected  hyperglycemia above 150mg /dl, associated with strict monitoring of glucose 4 times a day-before meals and at bedtime. - he is warned not to take insulin without proper monitoring per orders. -He wears Libre sensor pro, will bring his sensors every 10 days for download.  He could not afford the regular Libre 2. - Adjustment parameters are given to him for hypo and hyperglycemia in writing. - he is encouraged to call clinic for blood glucose levels less than 70 or above 300 mg /dl. - he is advised to continue Metformin 500 mg p.o. twice daily, therapeutically suitable for patient .  - he is not a candidate for incretin therapy due to his current heavy smoking, at risk for pancreatitis.    - Specific targets for  A1c;  LDL, HDL,  and Triglycerides were discussed with the patient.  2) Blood Pressure /Hypertension:  -His blood pressure is controlled to target.  he is advised to continue his current medications including Imdur 30 mg p.o. daily with breakfast . 3) Lipids/Hyperlipidemia:   Review of his recent lipid panel showed un controlled  LDL at 126 .  he  is advised to continue atorvastatin 80 mg p.o. nightly.   Side effects and precautions  discussed with him.  4)  Weight/Diet:  Body mass index is 26.15 kg/m.  -  he is  a candidate for some weight loss. I discussed with him the fact that loss of 5 - 10% of his  current body weight will have the most impact on his diabetes management.  Exercise, and detailed carbohydrates information provided  -  detailed on discharge instructions.  5) Chronic Care/Health Maintenance:  -he  is on Statin medications and  is encouraged to initiate and continue to follow up with Ophthalmology, Dentist,  Podiatrist at least yearly or according to recommendations, and advised to  quit smoking. I have recommended yearly flu vaccine and pneumonia vaccine at least every 5 years; moderate intensity exercise for up to 150 minutes weekly; and  sleep for at least 7 hours a day.   The patient was counseled on the dangers of tobacco use, and was advised to quit.  Reviewed strategies to maximize success, including removing cigarettes and smoking materials from environment.   He had normal ABI on May 08, 2020.  This study will be repeated in December 2026, or sooner if needed.  - he is  advised to maintain close follow up with 10-10-1992, PA-C for primary care needs, as well as his other providers for optimal and coordinated care.      - Time spent on this patient care encounter:  30 minutes of which 50% was spent in  counseling , reviewing his CGM analysis, and the rest reviewing  his current and  previous labs / studies and medications  doses and developing a plan for long term care. [name] [name]  participated in the discussions, expressed understanding, and voiced agreement with the above plans.  All questions were answered to his satisfaction. he is encouraged to contact clinic should he have any questions or concerns prior to his return visit.     Follow up plan: Return in 7 days for reevaluation. [name], MD [facility] [address] [place], [place] Phone: [phone]  Fax: [phone]    05/27/2020, 12:37 PM  This note was partially dictated with voice recognition software. Similar sounding words can be transcribed inadequately or may not  be corrected upon review.

## 2020-06-03 ENCOUNTER — Other Ambulatory Visit: Payer: Self-pay | Admitting: Physician Assistant

## 2020-06-04 ENCOUNTER — Telehealth: Payer: Self-pay | Admitting: Nutrition

## 2020-06-04 NOTE — Telephone Encounter (Signed)
VM left to call to discuss his monitoring and BS readings.

## 2020-06-11 ENCOUNTER — Encounter: Payer: Self-pay | Attending: "Endocrinology | Admitting: Nutrition

## 2020-06-11 ENCOUNTER — Other Ambulatory Visit: Payer: Self-pay

## 2020-06-11 DIAGNOSIS — E1165 Type 2 diabetes mellitus with hyperglycemia: Secondary | ICD-10-CM | POA: Insufficient documentation

## 2020-06-11 DIAGNOSIS — E782 Mixed hyperlipidemia: Secondary | ICD-10-CM | POA: Insufficient documentation

## 2020-06-11 DIAGNOSIS — I10 Essential (primary) hypertension: Secondary | ICD-10-CM | POA: Insufficient documentation

## 2020-06-11 NOTE — Progress Notes (Signed)
Medical Nutrition Therapy:  Appt start time: 1300 end time: 1330 Assessment:  Primary concerns today: Diabetes Type 2. He lives with his mom and dad. Goes to the [employer].   This visit was completed via telephone due to the COVID-19 pandemic.   I spoke with Charlotte Sanes. and verified that I was speaking with the correct person with two patient identifiers (full name and date of birth).   I discussed the limitations related to this kind of visit and the patient is willing to proceed.  Micah Flesher to [facility] this morning. Sees [email or web address]. Getting help with his depression and other issues.  Currently taking 60 units of Tresiba at bedtime and 14 units plus sliding scale of Humalog with meals. He is doing much better testing and taking insulin as prescribed. He feels better since his BS aren't in the 300-400's anymore. Last A1C was 14.6%, so he is doing much better now. Working on better meal planning.  FBS 144 mg/dl , took  15 units of Novolog. He didn't realize 14 units 90-150 mg/dl. BS last night 349 mg/dl, took 19 units  Lunch yesterday 217 units/16 units  Breakfast 278 mg/dl/17 units. Before supper 404 mg/dl/20. BS high due to eating too much of his lasagna. Will work on better portions and increasing lower carb vegetables with meals.   Today FBS 144 mg/dl./15 BS before lunch today 144 mg/dl  He is being followed and assisted as needed with [facility]. He does have therapist he works with but still has a lot of depression issues.     Lab Results  Component Value Date   HGBA1C 14.6 (A) 05/08/2020   CMP Latest Ref Rng & Units 04/07/2020 01/11/2020 12/21/2019  Glucose 70 - 99 mg/dL 161(W) 960(A) 540(J)  BUN 6 - 20 mg/dL 13 81(X) 20  Creatinine 0.61 - 1.24 mg/dL 9.14 7.82 9.56  Sodium 135 - 145 mmol/L 137 133(L) 136  Potassium 3.5 - 5.1 mmol/L 4.2 4.4 4.4  Chloride 98 - 111 mmol/L 103 101 102  CO2 22 - 32 mmol/L 26 22 23   Calcium 8.9 - 10.3 mg/dL ) 8.9 2.1(H)  Total Protein 6.5  - 8.1 g/dL 6.5 7.3 -  Total Bilirubin 0.3 - 1.2 mg/dL 0.7 0.4 -  Alkaline Phos 38 - 126 U/L 79 92 -  AST 15 - 41 U/L 17 16 -  ALT 0 - 44 U/L 16 18 -   Wt Readings from Last 3 Encounters:  05/27/20 172 lb (78 kg)  05/08/20 169 lb 12.8 oz (77 kg)  05/08/20 169 lb 8 oz (76.9 kg)   Ht Readings from Last 3 Encounters:  05/27/20 5\' 8"  (1.727 m)  05/08/20 5\' 8"  (1.727 m)  05/08/20 5\' 8"  (1.727 m)   There is no height or weight on file to calculate BMI. @BMIFA @ Facility age limit for growth percentiles is 20 years. Facility age limit for growth percentiles is 20 years.  Preferred Learning Style:   Auditory  Visual--likes to see stuff but limited reading ability.  Hands on  Learning Readiness:   Ready  Change in progress   MEDICATIONS: see list    DIETARY INTAKE:  B) Cornflakes, milk,  L) lasagna, water Dinner lasagna, water  Usual physical activity:  ADL   Estimated energy needs: 2000  calories 225 g carbohydrates 150 g protein 50 g fat  Progress Towards Goal(s):  In progress.   Nutritional Diagnosis:  NB-1.1 Food and nutrition-related knowledge deficit As related to Diabetes Type 2.  As evidenced by A1C 14%.    Intervention:  Nutrition and Diabetes education provided on My Plate, CHO counting, meal planning, portion sizes, timing of meals, avoiding snacks between meals unless having a low blood sugar, target ranges for A1C and blood sugars, signs/symptoms and treatment of hyper/hypoglycemia, monitoring blood sugars, taking medications as prescribed, benefits of exercising 30 minutes per day and prevention of complications of DM.  Goals set by patient  Keep blood sugar less than 200 mg/dl. Drink a gallon of water per day Increase lower carbohydrate vegetables with lunch and dinner May have 1 piece of fruit with meals. Get A1C down to 7% or below. Watch portion sizes   Teaching Method Utilized:  Visual Auditory Hands on  Handouts given during visit  include:  The Plate Method  Meal Plan Card   Barriers to learning/adherence to lifestyle change: Depression, limited reading  Demonstrated degree of understanding via:  Teach Back   Monitoring/Evaluation:  Dietary intake, exercise,  and body weight in 3 weeks (s).

## 2020-06-11 NOTE — Patient Instructions (Addendum)
Goals set by patient  Keep blood sugar less than 200 mg/dl. Drink a gallon of water per day Increase lower carbohydrate vegetables with lunch and dinner May have 1 piece of fruit with meals. Get A1C down to 7% or below. Watch portion sizes

## 2020-06-12 ENCOUNTER — Encounter: Payer: Self-pay | Admitting: Nutrition

## 2020-07-09 ENCOUNTER — Other Ambulatory Visit: Payer: Self-pay | Admitting: Physician Assistant

## 2020-07-09 MED ORDER — ALBUTEROL SULFATE HFA 108 (90 BASE) MCG/ACT IN AERS: INHALATION_SPRAY | RESPIRATORY_TRACT | 0 refills | Status: DC

## 2020-07-09 MED ORDER — FLUTICASONE-SALMETEROL 100-50 MCG/DOSE IN AEPB: INHALATION_SPRAY | RESPIRATORY_TRACT | 0 refills | Status: DC

## 2020-07-09 MED ORDER — ATORVASTATIN CALCIUM 80 MG PO TABS: ORAL_TABLET | ORAL | 0 refills | Status: DC

## 2020-07-16 ENCOUNTER — Other Ambulatory Visit: Payer: Self-pay | Admitting: Physician Assistant

## 2020-07-16 ENCOUNTER — Encounter: Payer: Self-pay | Attending: "Endocrinology | Admitting: Nutrition

## 2020-07-16 DIAGNOSIS — E785 Hyperlipidemia, unspecified: Secondary | ICD-10-CM

## 2020-07-16 DIAGNOSIS — I10 Essential (primary) hypertension: Secondary | ICD-10-CM | POA: Insufficient documentation

## 2020-07-16 DIAGNOSIS — E782 Mixed hyperlipidemia: Secondary | ICD-10-CM | POA: Insufficient documentation

## 2020-07-16 DIAGNOSIS — E1165 Type 2 diabetes mellitus with hyperglycemia: Secondary | ICD-10-CM

## 2020-07-16 NOTE — Progress Notes (Incomplete)
Medical Nutrition Therapy:  Appt start time: 1300 end time: 1330 Assessment:  Primary concerns today: Diabetes Type 2. He lives with his mom and dad. Goes to the [employer].   This visit was completed via telephone due to the COVID-19 pandemic.   I spoke with Charlotte Sanes. and verified that I was speaking with the correct person with two patient identifiers (full name and date of birth).   I discussed the limitations related to this kind of visit and the patient is willing to proceed.  Micah Flesher to [facility] this morning. Sees [email or web address]. Getting help with his depression and other issues.  Currently taking 60 units of Tresiba at bedtime and 14 units plus sliding scale of Humalog with meals. He is doing much better testing and taking insulin as prescribed. He feels better since his BS aren't in the 300-400's anymore. Last A1C was 14.6%, so he is doing much better now. Working on better meal planning.  FBS 144 mg/dl , took  15 units of Novolog. He didn't realize 14 units 90-150 mg/dl. BS last night 349 mg/dl, took 19 units  Lunch yesterday 217 units/16 units  Breakfast 278 mg/dl/17 units. Before supper 404 mg/dl/20. BS high due to eating too much of his lasagna. Will work on better portions and increasing lower carb vegetables with meals.   Today FBS 144 mg/dl./15 BS before lunch today 144 mg/dl  He is being followed and assisted as needed with [facility]. He does have therapist he works with but still has a lot of depression issues.     Lab Results  Component Value Date   HGBA1C 14.6 (A) 05/08/2020   CMP Latest Ref Rng & Units 04/07/2020 01/11/2020 12/21/2019  Glucose 70 - 99 mg/dL 161(W) 960(A) 540(J)  BUN 6 - 20 mg/dL 13 81(X) 20  Creatinine 0.61 - 1.24 mg/dL 9.14 7.82 9.56  Sodium 135 - 145 mmol/L 137 133(L) 136  Potassium 3.5 - 5.1 mmol/L 4.2 4.4 4.4  Chloride 98 - 111 mmol/L 103 101 102  CO2 22 - 32 mmol/L 26 22 23   Calcium 8.9 - 10.3 mg/dL ) 8.9 2.1(H)  Total Protein 6.5  - 8.1 g/dL 6.5 7.3 -  Total Bilirubin 0.3 - 1.2 mg/dL 0.7 0.4 -  Alkaline Phos 38 - 126 U/L 79 92 -  AST 15 - 41 U/L 17 16 -  ALT 0 - 44 U/L 16 18 -   Wt Readings from Last 3 Encounters:  05/27/20 172 lb (78 kg)  05/08/20 169 lb 12.8 oz (77 kg)  05/08/20 169 lb 8 oz (76.9 kg)   Ht Readings from Last 3 Encounters:  05/27/20 5\' 8"  (1.727 m)  05/08/20 5\' 8"  (1.727 m)  05/08/20 5\' 8"  (1.727 m)   There is no height or weight on file to calculate BMI. @BMIFA @ Facility age limit for growth percentiles is 20 years. Facility age limit for growth percentiles is 20 years.  Preferred Learning Style:   Auditory  Visual--likes to see stuff but limited reading ability.  Hands on  Learning Readiness:   Ready  Change in progress   MEDICATIONS: see list    DIETARY INTAKE:  B) Cornflakes, milk,  L) lasagna, water Dinner lasagna, water  Usual physical activity:  ADL   Estimated energy needs: 2000  calories 225 g carbohydrates 150 g protein 50 g fat  Progress Towards Goal(s):  In progress.   Nutritional Diagnosis:  NB-1.1 Food and nutrition-related knowledge deficit As related to Diabetes Type 2.  As evidenced by A1C 14%.    Intervention:  Nutrition and Diabetes education provided on My Plate, CHO counting, meal planning, portion sizes, timing of meals, avoiding snacks between meals unless having a low blood sugar, target ranges for A1C and blood sugars, signs/symptoms and treatment of hyper/hypoglycemia, monitoring blood sugars, taking medications as prescribed, benefits of exercising 30 minutes per day and prevention of complications of DM.  Goals set by patient  Keep blood sugar less than 200 mg/dl. Drink a gallon of water per day Increase lower carbohydrate vegetables with lunch and dinner May have 1 piece of fruit with meals. Get A1C down to 7% or below. Watch portion sizes   Teaching Method Utilized:  Visual Auditory Hands on  Handouts given during visit  include:  The Plate Method  Meal Plan Card   Barriers to learning/adherence to lifestyle change: Depression, limited reading  Demonstrated degree of understanding via:  Teach Back   Monitoring/Evaluation:  Dietary intake, exercise,  and body weight in 3 weeks (s). 

## 2020-07-21 ENCOUNTER — Encounter: Payer: Self-pay | Admitting: Physician Assistant

## 2020-07-21 ENCOUNTER — Other Ambulatory Visit: Payer: Self-pay

## 2020-07-21 ENCOUNTER — Ambulatory Visit: Payer: Self-pay | Admitting: Physician Assistant

## 2020-07-21 VITALS — BP 126/80 | HR 110 | Temp 98.4°F

## 2020-07-21 DIAGNOSIS — H1032 Unspecified acute conjunctivitis, left eye: Secondary | ICD-10-CM

## 2020-07-21 MED ORDER — OFLOXACIN 0.3 % OP SOLN
1.0000 [drp] | Freq: Four times a day (QID) | OPHTHALMIC | 0 refills | Status: DC
Start: 2020-07-21 — End: 2020-08-26

## 2020-07-21 NOTE — Progress Notes (Signed)
   BP 126/80   Pulse (!) 110   Temp 98.4 F (36.9 C)   SpO2 96%    Subjective:    Patient ID: [name] [name], male    DOB: 03/16/1971, 50 y.o.   MRN: [id number]  HPI: [name] [name] is a 50 y.o. male presenting on 07/21/2020 for No chief complaint on file.   HPI  Pt had a negative covid 19 screening questionnaionaire    Pt says both eyes itching and watering and red with discharge.  The Right eye started yesterday and today he is feeling it in his left eye also.  He wear glasses.  he does not wear contacts.  He feels like he has a hazy blurring in the eyes.     Relevant past medical, surgical, family and social history reviewed and updated as indicated. Interim medical history since our last visit reviewed. Allergies and medications reviewed and updated.     Review of Systems  Per HPI unless specifically indicated above     Objective:    BP 126/80   Pulse (!) 110   Temp 98.4 F (36.9 C)   SpO2 96%   Wt Readings from Last 3 Encounters:  05/27/20 172 lb (78 kg)  05/08/20 169 lb 12.8 oz (77 kg)  05/08/20 169 lb 8 oz (76.9 kg)      VISUAL ACUITY: Without glasses:  OS 20/200    OD 20/100 With glasses:  OS - 20/50    OD   20/40     Physical Exam Constitutional:      General: He is not in acute distress.    Appearance: He is not toxic-appearing.  HENT:     Head: Normocephalic and atraumatic.  Eyes:     General: Lids are normal.        Right eye: No foreign body.        Left eye: Discharge present.No foreign body.     Extraocular Movements: Extraocular movements intact.     Right eye: Normal extraocular motion and no nystagmus.     Left eye: Normal extraocular motion and no nystagmus.     Conjunctiva/sclera:     Right eye: No hemorrhage.    Left eye: Left conjunctiva is injected. No hemorrhage.    Pupils: Pupils are equal, round, and reactive to light.     Left eye: No fluorescein uptake.     Comments: propraracaine 1 gtt OU.  Fluorescein OS- no  uptake or ulceration  Pulmonary:     Effort: No respiratory distress.  Neurological:     Mental Status: He is alert and oriented to person, place, and time.  Psychiatric:        Attention and Perception: Attention normal.        Speech: Speech normal.        Behavior: Behavior is cooperative.           Assessment & Plan:    Encounter Diagnosis  Name Primary?  . Acute conjunctivitis of left eye, unspecified acute conjunctivitis type Yes     -rx ocuflox and counseled on hygiene / keeping hands out of eyes.  Pt was given coupon -He has routine appointment next week.  He is to contact office sooner prn worsening or other problems

## 2020-07-22 ENCOUNTER — Ambulatory Visit: Payer: Self-pay | Admitting: Physician Assistant

## 2020-07-29 ENCOUNTER — Other Ambulatory Visit: Payer: Self-pay

## 2020-07-29 ENCOUNTER — Other Ambulatory Visit (HOSPITAL_COMMUNITY)
Admission: RE | Admit: 2020-07-29 | Discharge: 2020-07-29 | Disposition: A | Discharge status: Home / Self Care | Payer: Self-pay | Source: Ambulatory Visit | Attending: Physician Assistant | Admitting: Physician Assistant

## 2020-07-29 DIAGNOSIS — E785 Hyperlipidemia, unspecified: Secondary | ICD-10-CM | POA: Insufficient documentation

## 2020-07-29 DIAGNOSIS — E1165 Type 2 diabetes mellitus with hyperglycemia: Secondary | ICD-10-CM | POA: Insufficient documentation

## 2020-07-29 LAB — COMPREHENSIVE METABOLIC PANEL
ALT: 16 U/L (ref 0–44)
AST: 14 U/L — ABNORMAL LOW (ref 15–41)
Albumin: 3.3 g/dL — ABNORMAL LOW (ref 3.5–5.0)
Alkaline Phosphatase: 80 U/L (ref 38–126)
Anion gap: 11 (ref 5–15)
BUN: 12 mg/dL (ref 6–20)
CO2: 24 mmol/L (ref 22–32)
Calcium: 8.4 mg/dL — ABNORMAL LOW (ref 8.9–10.3)
Chloride: 100 mmol/L (ref 98–111)
Creatinine, Ser: 0.97 mg/dL (ref 0.61–1.24)
GFR, Estimated: >60 mL/min (ref >60)
Glucose, Bld: 344 mg/dL — ABNORMAL HIGH (ref 70–99)
Potassium: 4.2 mmol/L (ref 3.5–5.1)
Sodium: 135 mmol/L (ref 135–145)
Total Bilirubin: 1.1 mg/dL (ref 0.3–1.2)
Total Protein: 6.5 g/dL (ref 6.5–8.1)

## 2020-07-29 LAB — LIPID PANEL
Cholesterol: 217 mg/dL — ABNORMAL HIGH (ref 0–200)
HDL: 36 mg/dL — ABNORMAL LOW (ref >40)
LDL Cholesterol: 112 mg/dL — ABNORMAL HIGH (ref 0–99)
Total CHOL/HDL Ratio: 6 RATIO
Triglycerides: 345 mg/dL — ABNORMAL HIGH (ref <150)
VLDL: 69 mg/dL — ABNORMAL HIGH (ref 0–40)

## 2020-07-30 ENCOUNTER — Ambulatory Visit: Payer: Self-pay | Admitting: Physician Assistant

## 2020-07-30 ENCOUNTER — Telehealth: Payer: Self-pay

## 2020-07-30 ENCOUNTER — Encounter: Payer: Self-pay | Admitting: Physician Assistant

## 2020-07-30 VITALS — BP 128/88 | HR 98 | Temp 97.9°F | Wt 167.0 lb

## 2020-07-30 DIAGNOSIS — K219 Gastro-esophageal reflux disease without esophagitis: Secondary | ICD-10-CM

## 2020-07-30 DIAGNOSIS — F17200 Nicotine dependence, unspecified, uncomplicated: Secondary | ICD-10-CM

## 2020-07-30 DIAGNOSIS — E1165 Type 2 diabetes mellitus with hyperglycemia: Secondary | ICD-10-CM

## 2020-07-30 DIAGNOSIS — E785 Hyperlipidemia, unspecified: Secondary | ICD-10-CM

## 2020-07-30 DIAGNOSIS — K2270 Barrett's esophagus without dysplasia: Secondary | ICD-10-CM

## 2020-07-30 MED ORDER — PANTOPRAZOLE SODIUM 40 MG PO TBEC: 40.0000 mg | DELAYED_RELEASE_TABLET | Freq: Every day | ORAL | 11 refills | Status: DC

## 2020-07-30 NOTE — Telephone Encounter (Signed)
Returned client's call. He is due to renew [facility] 08/14/20. Per provider notes he also needs CAFA assistance for specialist.  Client scheduled for in office visit at [facility] on 08/01/20 at 10 am to renew Care Connect as well as [facility] and to begin to reapply for CAFA.  Plan: to follow up and discuss DM that is uncontrolled.   [name] RN [employer].

## 2020-07-30 NOTE — Progress Notes (Signed)
BP 128/88   Pulse 98   Temp 97.9 F (36.6 C)   Wt 167 lb (75.8 kg)   SpO2 98%   BMI 25.39 kg/m    Subjective:    Patient ID: [name] [name], male    DOB: 12-01-1970, 50 y.o.   MRN: [id number]  HPI: [name] [name] is a 50 y.o. male presenting on 07/30/2020 for No chief complaint on file.   HPI   Pt had a negative covid 19 screening questionnaire.   Pt is 49yoM who presents for routine follow up dyslipidemia.  He has DM that is managed by endocrinology.  He got his covid booster but doesn't have his card with him today.    He says he is doing well and has no complaints.  He is not having any CP or sob.   Pt is requesting refill protonix, something that can go to med[facility]ist if possible.   He has been out of the protonix for some time.      Relevant past medical, surgical, family and social history reviewed and updated as indicated. Interim medical history since our last visit reviewed. Allergies and medications reviewed and updated.   Current Outpatient Medications:  .  acetaminophen (TYLENOL) 325 MG tablet, Take 2 tablets (650 mg total) by mouth every 6 (six) hours as needed for moderate pain. (Patient taking differently: Take 650-975 mg by mouth every 6 (six) hours as needed for moderate pain.), Disp: , Rfl:  .  albuterol (PROVENTIL HFA) 108 (90 Base) MCG/ACT inhaler, INHALE 2 PUFFS BY MOUTH EVERY 6 HOURS AS NEEDED FOR COUGHING, WHEEZING, OR SHORTNESS OF BREATH, Disp: 3 each, Rfl: 0 .  ARIPiprazole (ABILIFY) 5 MG tablet, Take 5 mg by mouth daily., Disp: , Rfl:  .  aspirin 81 MG tablet, Take 1 tablet (81 mg total) by mouth at bedtime., Disp: 30 tablet, Rfl:  .  atorvastatin (LIPITOR) 80 MG tablet, TAKE 1 Tablet BY MOUTH ONCE EVERY DAY, Disp: 90 tablet, Rfl: 0 .  DULERA 100-5 MCG/ACT AERO, INHALE 2 PUFFS BY MOUTH TWICE DAILY. RINSE MOUTH AFTER EACH USE (Patient taking differently: Inhale 2 puffs into the lungs 2 (two) times daily.), Disp: 13 g, Rfl: 0 .  ibuprofen (ADVIL)  200 MG tablet, Take 400-600 mg by mouth every 6 (six) hours as needed for moderate pain., Disp: , Rfl:  .  insulin aspart (NOVOLOG) 100 UNIT/ML injection, Inject 14-20 Units into the skin 3 (three) times daily before meals., Disp: 15 mL, Rfl: 2 .  insulin glargine (LANTUS SOLOSTAR) 100 UNIT/ML Solostar Pen, Inject 60 Units into the skin at bedtime. (Patient taking differently: Inject 65 Units into the skin at bedtime.), Disp: 15 mL, Rfl:  .  metFORMIN (GLUCOPHAGE) 500 MG tablet, Take 1 tablet (500 mg total) by mouth 2 (two) times daily with a meal., Disp: 60 tablet, Rfl: 1 .  nitroGLYCERIN (NITROSTAT) 0.4 MG SL tablet, Place 1 tablet (0.4 mg total) under the tongue every 5 (five) minutes as needed for chest pain., Disp: 25 tablet, Rfl: 3 .  ofloxacin (OCUFLOX) 0.3 % ophthalmic solution, Place 1 drop into both eyes 4 (four) times daily., Disp: 5 mL, Rfl: 0 .  promethazine (PHENERGAN) 25 MG suppository, Place 1 suppository (25 mg total) rectally every 8 (eight) hours as needed for refractory nausea / vomiting., Disp: 12 each, Rfl: 0 .  promethazine (PHENERGAN) 25 MG tablet, Take 25 mg by mouth every 8 (eight) hours as needed for nausea or vomiting., Disp: , Rfl:  .  sertraline (ZOLOFT) 100 MG tablet, Take 1 tablet by mouth daily., Disp: , Rfl:  .  Carboxymethylcellul-Glycerin (LUBRICATING EYE DROPS OP), Place 1 drop into both eyes daily as needed (itching eyes). (Patient not taking: Reported on 07/30/2020), Disp: , Rfl:  .  isosorbide mononitrate (IMDUR) 30 MG 24 hr tablet, Take 1 tablet (30 mg total) by mouth daily., Disp: 90 tablet, Rfl: 3 .  metoCLOPramide (REGLAN) 5 MG tablet, Take 1 tablet (5 mg total) by mouth 4 (four) times daily -  before meals and at bedtime. (Patient not taking: Reported on 07/30/2020), Disp: 120 tablet, Rfl: 1 .  pantoprazole (PROTONIX) 40 MG tablet, Take 1 tablet (40 mg total) by mouth 2 (two) times daily before a meal. (Patient not taking: Reported on 07/30/2020), Disp: 60 tablet,  Rfl: 11     Review of Systems  Per HPI unless specifically indicated above     Objective:    BP 128/88   Pulse 98   Temp 97.9 F (36.6 C)   Wt 167 lb (75.8 kg)   SpO2 98%   BMI 25.39 kg/m   Wt Readings from Last 3 Encounters:  07/30/20 167 lb (75.8 kg)  05/27/20 172 lb (78 kg)  05/08/20 169 lb 12.8 oz (77 kg)    Physical Exam Vitals reviewed.  Constitutional:      General: He is not in acute distress.    Appearance: He is well-developed and well-nourished. He is not ill-appearing.  HENT:     Head: Normocephalic and atraumatic.  Cardiovascular:     Rate and Rhythm: Normal rate and regular rhythm.  Pulmonary:     Effort: Pulmonary effort is normal.     Breath sounds: Normal breath sounds. No wheezing.  Abdominal:     General: Bowel sounds are normal.     Palpations: Abdomen is soft. There is no hepatosplenomegaly.     Tenderness: There is no abdominal tenderness.  Musculoskeletal:        General: No edema.     Cervical back: Neck supple.     Right lower leg: No edema.     Left lower leg: No edema.  Lymphadenopathy:     Cervical: No cervical adenopathy.  Skin:    General: Skin is warm and dry.  Neurological:     Mental Status: He is alert and oriented to person, place, and time.  Psychiatric:        Mood and Affect: Mood and affect normal.        Behavior: Behavior normal.     Results for orders placed or performed during the hospital encounter of 07/29/20  Comprehensive metabolic panel  Result Value Ref Range   Sodium 135 135 - 145 mmol/L   Potassium 4.2 3.5 - 5.1 mmol/L   Chloride 100 98 - 111 mmol/L   CO2 24 22 - 32 mmol/L   Glucose, Bld 344 (H) 70 - 99 mg/dL   BUN 12 6 - 20 mg/dL   Creatinine, Ser 2.67 0.61 - 1.24 mg/dL   Calcium 8.4 (L) 8.9 - 10.3 mg/dL   Total Protein 6.5 6.5 - 8.1 g/dL   Albumin 3.3 (L) 3.5 - 5.0 g/dL   AST 14 (L) 15 - 41 U/L   ALT 16 0 - 44 U/L   Alkaline Phosphatase 80 38 - 126 U/L   Total Bilirubin 1.1 0.3 - 1.2 mg/dL    GFR, Estimated >12 >45 mL/min   Anion gap 11 5 - 15  Lipid panel  Result  Value Ref Range   Cholesterol 217 (H) 0 - 200 mg/dL   Triglycerides 622 (H) <150 mg/dL   HDL 36 (L) >29 mg/dL   Total CHOL/HDL Ratio 6.0 RATIO   VLDL 69 (H) 0 - 40 mg/dL   LDL Cholesterol 798 (H) 0 - 99 mg/dL      Assessment & Plan:    Encounter Diagnoses  Name Primary?  . Hyperlipidemia, unspecified hyperlipidemia type Yes  . Uncontrolled type 2 diabetes mellitus with hyperglycemia (HCC)   . Gastroesophageal reflux disease, unspecified whether esophagitis present   . Tobacco use disorder   . Barrett's esophagus without dysplasia      -Reviewed labs with pt -discussed that only ppi available through [facility] is omeprazole which pt reports allergy.  rx protonix with coupon for [employer] -counseled smoking cessation -pt to Continue with endocrinology for DM -refer for annual DM eye exam -re-refer to GI for barretts.  He was last seen by GI in the office on  11/22/22 by Tawni Pummel.  His last EGD was 03/23/19 by Dr Karilyn Cota. Biopsy at that time was consistent with Barrett's esophagus.  -pt is given cafa /application for [employer] financial assistance -he is encouraged to contact [facility] to get assistance with completing and submitting the paperwork -pt to continue current medications.  Only changes today is refill protonix.  He is encouraged to watch lowfat diet. -pt to follow up here 3 months.  He is to contact office sooner prn

## 2020-07-31 ENCOUNTER — Encounter (INDEPENDENT_AMBULATORY_CARE_PROVIDER_SITE_OTHER): Payer: Self-pay | Admitting: *Deleted

## 2020-08-01 ENCOUNTER — Telehealth: Payer: Self-pay

## 2020-08-01 NOTE — Congregational Nurse Program (Signed)
Client in to [name] today to renew [facility] Connect as well as apply for [employer] financial assistance.  Followed up with client regarding his Diabetes not being controlled. He reports blood sugars being in "300s" He is still seeing Dr. [name] Endocrinologist, but was getting bills for Diabetic educator/Nutritionist. Discussed with client that keeping his cone financial assistance in place and renewing every 6 months will help with those copayment, lab bills etc, GI referral that are all part of [facility] system. Client reports understanding.  Discussed client's current diet. He relies mostly on his mother to cook foods and he states she bakes things mostly. He states he warms "pot pies" discussed checking labels for carbohydrate content as well as salt.  He reports he does not drink "sugary drinks" He states he drinks sugar free crystal light in his bottle of water. We discussed portion control and amounts of carbs per meal, to eat lean meats such as chicken, fish lean cuts of beef and amounts. We discussed avoiding things such as pies, cakes, pastries, chips and in between snacking. Client reports issues with acid reflux at night, He states he does not eat and lay down that his last meal or food is at least 2 hours prior to bedtime.  Exercise: client reports he will walk to the mailbox which is "a good ways down our road" he complains of hip and back pain as well as shortness of breath with walking. He states he has COPD. Discussed briefly a possible new program if funding is available that would help our Diabetic patients such as him get into the [employer] to use the pool for low impact exercise. Client reports he would be interested if it becomes available. Will keep client posted on that progress.  Client reports he is taking his medications as prescribed. Encouraged him to take all medications and logsheets to his provider at [facility] and Endocrinology each visit. His next appointments are: [facility]: November 03, 2020 [facility] Endocrinology: March 28,2022 [facility]: May 19,2022  Referral to [facility] food market that is today for food assistance: Market is first Friday of each month.  Will continue to follow client   [name] RN [facility]

## 2020-08-01 NOTE — Telephone Encounter (Signed)
Attempted to call client after left from enrollment to follow up regarding Mental Health services options.  No answer and unable to leave message.  Will attempt again  Later today.

## 2020-08-11 ENCOUNTER — Ambulatory Visit: Payer: Self-pay | Admitting: *Deleted

## 2020-08-11 NOTE — Telephone Encounter (Signed)
Boil on the posterior side of scrotum. Itches/burns at times, applied head earlier and noted clear drainage. Denies fever/rash/illness. Feels tight when he voids, sore to sit. Care advice: sitz bath, keep area dry, apply antibiotic ointment if available. Use blanket or pillow to cushion underneath while sitting. Return the call to your pcp for appointment tomorrow. If not available walk in to [facility] for evaluation. Verbalized understanding. Reason for Disposition . [1] Boil > 1/2 inch across (> 12 mm; larger than a marble) AND [2] center is soft or pus colored  Answer Assessment - Initial Assessment Questions 1. APPEARANCE of BOIL: "What does the boil look like?"      Pink and grape-sized 2. LOCATION: "Where is the boil located?"      Posterior of scrotum 3. NUMBER: "How many boils are there?"      one 4. SIZE: "How big is the boil?" (e.g., inches, cm; compare to size of a coin or other object)     Grape-sized 5. ONSET: "When did the boil start?"     3 days ago 6. PAIN: "Is there any pain?" If Yes, ask: "How bad is the pain?"   (Scale 1-10; or mild, moderate, severe)     Yes, itches and sometimes burns  7. FEVER: "Do you have a fever?" If Yes, ask: "What is it, how was it measured, and when did it start?"      no 8. SOURCE: "Have you been around anyone with boils or other Staph infections?" "Have you ever had boils before?"     no 9. OTHER SYMPTOMS: "Do you have any other symptoms?" (e.g., shaking chills, weakness, rash elsewhere on body)     None reported 10. PREGNANCY: "Is there any chance you are pregnant?" "When was your last menstrual period?"       na  Protocols used: BOIL (SKIN ABSCESS)-A-AH

## 2020-08-12 ENCOUNTER — Other Ambulatory Visit: Payer: Self-pay

## 2020-08-12 ENCOUNTER — Ambulatory Visit (HOSPITAL_COMMUNITY)
Admission: RE | Admit: 2020-08-12 | Discharge: 2020-08-12 | Disposition: A | Discharge status: Home / Self Care | Payer: Self-pay | Source: Ambulatory Visit | Attending: Physician Assistant | Admitting: Physician Assistant

## 2020-08-12 ENCOUNTER — Other Ambulatory Visit: Payer: Self-pay | Admitting: Physician Assistant

## 2020-08-12 ENCOUNTER — Ambulatory Visit: Payer: Self-pay | Admitting: Physician Assistant

## 2020-08-12 ENCOUNTER — Encounter: Payer: Self-pay | Admitting: Physician Assistant

## 2020-08-12 VITALS — BP 126/82 | HR 102 | Temp 97.5°F | Wt 167.0 lb

## 2020-08-12 DIAGNOSIS — N5089 Other specified disorders of the male genital organs: Secondary | ICD-10-CM

## 2020-08-12 DIAGNOSIS — N492 Inflammatory disorders of scrotum: Secondary | ICD-10-CM

## 2020-08-12 NOTE — Progress Notes (Signed)
   BP 126/82   Pulse (!) 102   Temp (!) 97.5 F (36.4 C)   Wt 167 lb (75.8 kg)   SpO2 91%   BMI 25.39 kg/m    Subjective:    Patient ID: [name] [name], male    DOB: Mar 29, 1971, 50 y.o.   MRN: [id number]  HPI: [name] [name] is a 49 y.o. male presenting on 08/12/2020 for Mass   HPI  Pt had a negative covid 19 screening questionnaire.     Pt states mass / boil in testicle that just showed up 4 days ago.  It is sore.  It is not draining.  He says it was not there at all last week.       Relevant past medical, surgical, family and social history reviewed and updated as indicated. Interim medical history since our last visit reviewed. Allergies and medications reviewed and updated.  Review of Systems  Per HPI unless specifically indicated above     Objective:    BP 126/82   Pulse (!) 102   Temp (!) 97.5 F (36.4 C)   Wt 167 lb (75.8 kg)   SpO2 91%   BMI 25.39 kg/m   Wt Readings from Last 3 Encounters:  08/12/20 167 lb (75.8 kg)  07/30/20 167 lb (75.8 kg)  05/27/20 172 lb (78 kg)    Physical Exam Vitals reviewed. Exam conducted with a chaperone present.  Constitutional:      General: He is not in acute distress.    Appearance: He is not toxic-appearing.  HENT:     Head: Normocephalic and atraumatic.  Pulmonary:     Effort: Pulmonary effort is normal. No respiratory distress.  Genitourinary:    Penis: Circumcised.      Testes: Normal.        Right: Mass, tenderness or swelling not present.        Left: Mass, tenderness or swelling not present.     Comments: There is a hard mass in the scrotum, towards the left.  It is about the size of his testes but is hard, not fluctuant, not freely mobile.  It is nontender.  There are a few areas of skin irritation but these do not correspond with the mass. Neurological:     Mental Status: He is alert and oriented to person, place, and time.  Psychiatric:        Attention and Perception: Attention normal.         Speech: Speech normal.        Behavior: Behavior is cooperative.             Assessment & Plan:     Encounter Diagnosis  Name Primary?  . Scrotal mass Yes       -pt is sent for [place] -cafa / [employer] financial assistancte application submitted 08/01/20 and still pending

## 2020-08-12 NOTE — Congregational Nurse Program (Signed)
Updated Care Coordination to reflect client is eligible for [place] [facility] until 07/29/2021.  Client enrolled with Care connect program.   [name] RN [facility]/ Care connect

## 2020-08-14 ENCOUNTER — Encounter: Payer: Self-pay | Admitting: General Surgery

## 2020-08-14 ENCOUNTER — Other Ambulatory Visit: Payer: Self-pay

## 2020-08-14 ENCOUNTER — Ambulatory Visit (INDEPENDENT_AMBULATORY_CARE_PROVIDER_SITE_OTHER): Payer: Self-pay | Admitting: General Surgery

## 2020-08-14 VITALS — BP 133/85 | HR 104 | Temp 98.0°F | Resp 18 | Ht 68.0 in | Wt 165.0 lb

## 2020-08-14 DIAGNOSIS — N5089 Other specified disorders of the male genital organs: Secondary | ICD-10-CM

## 2020-08-15 ENCOUNTER — Other Ambulatory Visit: Payer: Self-pay | Admitting: Family Medicine

## 2020-08-15 DIAGNOSIS — N5089 Other specified disorders of the male genital organs: Secondary | ICD-10-CM

## 2020-08-15 NOTE — Progress Notes (Signed)
[name] [name]; [id number]; 23-May-1971   HPI Patient is a 50 year old white male who was referred to my care by [name] for evaluation and treatment of a scrotal abscess.  Patient states has been present for many months.  He states he has had some bloody drainage from the wound.  He denies any fever or chills. Past Medical History:  Diagnosis Date  . Anxiety   . Arthritis   . CAD (coronary artery disease)    Moderate LAD disease 2016 - Dr. Jacinto Halim  . Chronic bronchitis (HCC)   . Chronic upper back pain   . COPD (chronic obstructive pulmonary disease) (HCC)   . Depression   . Diabetic peripheral neuropathy (HCC)   . GERD (gastroesophageal reflux disease)   . History of gout   . Hyperlipemia   . Hypertension   . Migraine   . Noncompliance   . 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 Sleep apnea 2016   "could not afford CPAP".  . Type 2 diabetes mellitus (HCC)     Past Surgical History:  Procedure Laterality Date  . ANKLE SURGERY Right 1982   "had extra bones in there; took them out"  . APPENDECTOMY  1975  . BIOPSY  12/26/2018   Procedure: BIOPSY;  Surgeon: Malissa Hippo, MD;  Location: [facility];  Service: Endoscopy;;  duodenal biopsies  . BIOPSY  03/23/2019   Procedure: BIOPSY;  Surgeon: Malissa Hippo, MD;  Location: [facility];  Service: Endoscopy;;  esophagus  . CARDIAC CATHETERIZATION N/A 03/28/2015   Procedure: Left Heart Cath and Coronary Angiography;  Surgeon: Yates Decamp, MD;  Location: [facility] INVASIVE CV LAB;  Service: Cardiovascular;  Laterality: N/A;  . CARDIAC CATHETERIZATION N/A 03/28/2015   Procedure: Intravascular Pressure Wire/FFR Study;  Surgeon: Yates Decamp, MD;  Location: [facility] INVASIVE CV LAB;  Service: Cardiovascular;  Laterality: N/A;  . CARPAL TUNNEL RELEASE Left ~ 2008  . COLONOSCOPY WITH ESOPHAGOGASTRODUODENOSCOPY (EGD)    . COLONOSCOPY WITH PROPOFOL N/A 12/24/2019   Procedure: COLONOSCOPY WITH PROPOFOL;  Surgeon: Malissa Hippo, MD;  Location: [facility];  Service: Endoscopy;  Laterality: N/A;  955  . ELBOW FRACTURE SURGERY Left ~ 2008  . ESOPHAGOGASTRODUODENOSCOPY N/A 12/26/2018   Procedure: ESOPHAGOGASTRODUODENOSCOPY (EGD);  Surgeon: Malissa Hippo, MD;  Location: [facility];  Service: Endoscopy;  Laterality: N/A;  . ESOPHAGOGASTRODUODENOSCOPY (EGD) WITH PROPOFOL N/A 03/23/2019   Procedure: ESOPHAGOGASTRODUODENOSCOPY (EGD) WITH PROPOFOL;  Surgeon: Malissa Hippo, MD;  Location: [facility];  Service: Endoscopy;  Laterality: N/A;  7:30  . FRACTURE SURGERY     ankle and elbow  . PILONIDAL CYST EXCISION N/A 03/24/2017   Procedure: EXCISION CHRONIC  PILONIDAL ABSCESS;  Surgeon: [name] Miyamoto, MD;  Location: [facility];  Service: General;  Laterality: N/A;  . TENDON REPAIR Left ~ 2004   "main tendon in my ankle"    Family History  Problem Relation Age of Onset  . Congestive Heart Failure Sister   . Diabetes Mother   . Hypertension Mother   . Cancer Mother   . Diabetes Father   . Hypertension Father   . Hyperlipidemia Father     Current Outpatient Medications on File Prior to Visit  Medication Sig Dispense Refill  . acetaminophen (TYLENOL) 325 MG tablet Take 2 tablets (650 mg total) by mouth every 6 (six) hours as needed for moderate pain. (Patient taking differently: Take 650-975 mg by mouth every 6 (six) hours as needed for moderate pain.)    .  albuterol (PROVENTIL HFA) 108 (90 Base) MCG/ACT inhaler INHALE 2 PUFFS BY MOUTH EVERY 6 HOURS AS NEEDED FOR COUGHING, WHEEZING, OR SHORTNESS OF BREATH 3 each 0  . ARIPiprazole (ABILIFY) 5 MG tablet Take 5 mg by mouth daily.    Marland Kitchen aspirin 81 MG tablet Take 1 tablet (81 mg total) by mouth at bedtime. 30 tablet   . atorvastatin (LIPITOR) 80 MG tablet TAKE 1 Tablet BY MOUTH ONCE EVERY DAY 90 tablet 0  . Carboxymethylcellul-Glycerin (LUBRICATING EYE DROPS OP) Place 1 drop into both eyes daily as needed (itching eyes).    . DULERA 100-5 MCG/ACT AERO INHALE 2 PUFFS BY MOUTH TWICE  DAILY. RINSE MOUTH AFTER EACH USE (Patient taking differently: Inhale 2 puffs into the lungs 2 (two) times daily.) 13 g 0  . ibuprofen (ADVIL) 200 MG tablet Take 400-600 mg by mouth every 6 (six) hours as needed for moderate pain.    Marland Kitchen insulin aspart (NOVOLOG) 100 UNIT/ML injection Inject 14-20 Units into the skin 3 (three) times daily before meals. 15 mL 2  . insulin glargine (LANTUS SOLOSTAR) 100 UNIT/ML Solostar Pen Inject 60 Units into the skin at bedtime. (Patient taking differently: Inject 65 Units into the skin at bedtime.) 15 mL   . metFORMIN (GLUCOPHAGE) 500 MG tablet Take 1 tablet (500 mg total) by mouth 2 (two) times daily with a meal. 60 tablet 1  . metoCLOPramide (REGLAN) 5 MG tablet Take 1 tablet (5 mg total) by mouth 4 (four) times daily -  before meals and at bedtime. 120 tablet 1  . nitroGLYCERIN (NITROSTAT) 0.4 MG SL tablet Place 1 tablet (0.4 mg total) under the tongue every 5 (five) minutes as needed for chest pain. 25 tablet 3  . ofloxacin (OCUFLOX) 0.3 % ophthalmic solution Place 1 drop into both eyes 4 (four) times daily. 5 mL 0  . pantoprazole (PROTONIX) 40 MG tablet Take 1 tablet (40 mg total) by mouth daily. 30 tablet 11  . promethazine (PHENERGAN) 25 MG suppository Place 1 suppository (25 mg total) rectally every 8 (eight) hours as needed for refractory nausea / vomiting. 12 each 0  . promethazine (PHENERGAN) 25 MG tablet Take 25 mg by mouth every 8 (eight) hours as needed for nausea or vomiting.    . sertraline (ZOLOFT) 100 MG tablet Take 1 tablet by mouth daily.    . isosorbide mononitrate (IMDUR) 30 MG 24 hr tablet Take 1 tablet (30 mg total) by mouth daily. 90 tablet 3   No current facility-administered medications on file prior to visit.    Allergies  Allergen Reactions  . Omeprazole Magnesium Swelling    Face swells, no breathing impairment  . Bee Venom   . Esomeprazole Swelling    Face swells, no breathing impairment    Social History   Substance and  Sexual Activity  Alcohol Use Never  . Alcohol/week: 0.0 standard drinks    Social History   Tobacco Use  Smoking Status Current Every Day Smoker  . Packs/day: 0.50  . Years: 34.00  . Pack years: 17.00  . Types: Cigarettes  Smokeless Tobacco Never Used  Tobacco Comment   1/2 pack a day    Review of Systems  Constitutional: Positive for diaphoresis and malaise/fatigue.  HENT: Positive for sinus pain.   Eyes: Positive for blurred vision and pain.  Respiratory: Positive for cough, shortness of breath and wheezing.   Cardiovascular: Negative.   Gastrointestinal: Positive for abdominal pain, heartburn and nausea.  Genitourinary: Positive for frequency.  Musculoskeletal: Positive for back pain, joint pain and neck pain.  Skin:       History of boils, dry skin  Neurological: Positive for dizziness.  Endo/Heme/Allergies: Negative.   Psychiatric/Behavioral: Negative.     Objective   Vitals:   08/14/20 0906  BP: 133/85  Pulse: (!) 104  Resp: 18  Temp: 98 F (36.7 C)  SpO2: 97%    Physical Exam Vitals reviewed.  Constitutional:      Appearance: Normal appearance. He is normal weight. He is not ill-appearing.  HENT:     Head: Normocephalic and atraumatic.  Cardiovascular:     Rate and Rhythm: Normal rate and regular rhythm.     Heart sounds: Normal heart sounds. No murmur heard. No friction rub. No gallop.   Pulmonary:     Effort: Pulmonary effort is normal. No respiratory distress.     Breath sounds: Normal breath sounds. No stridor. No wheezing, rhonchi or rales.  Genitourinary:    Comments: Atretic testicles.  Thickened raised irregular skin lesion is present along the inferior aspect of the midline of the scrotum.  No drainage is noted.  No fistulous tract to the rectum.  No fluctuance present.  Mild erythema is present. Skin:    General: Skin is warm and dry.  Neurological:     Mental Status: He is alert and oriented to person, place, and time.    Media  Information         Document Information  Photos    08/14/2020 09:38  Attached To:  Office Visit on 08/14/20 with [name] Macho, MD   Source Information  [name] Macho, MD  [facility]     Assessment  Scrotal skin lesion, no abscess present.  No fistula present. Plan   I am referring the patient to Dr. Ronne Binning of urology for further evaluation as a neoplastic or chronic inflammatory process may be involved.  Further management is pending those results.

## 2020-08-15 NOTE — Progress Notes (Signed)
refe

## 2020-08-25 ENCOUNTER — Ambulatory Visit: Payer: Self-pay | Admitting: "Endocrinology

## 2020-08-26 ENCOUNTER — Encounter: Payer: Self-pay | Admitting: Physician Assistant

## 2020-08-26 ENCOUNTER — Other Ambulatory Visit: Payer: Self-pay

## 2020-08-26 ENCOUNTER — Ambulatory Visit: Payer: Self-pay | Admitting: Physician Assistant

## 2020-08-26 VITALS — BP 128/56 | HR 107 | Temp 97.3°F | Wt 163.5 lb

## 2020-08-26 DIAGNOSIS — H1031 Unspecified acute conjunctivitis, right eye: Secondary | ICD-10-CM

## 2020-08-26 MED ORDER — ERYTHROMYCIN 5 MG/GM OP OINT: 1.0000 "application " | TOPICAL_OINTMENT | Freq: Four times a day (QID) | OPHTHALMIC | 0 refills | Status: DC

## 2020-08-26 NOTE — Patient Instructions (Signed)
Bacterial Conjunctivitis, Adult Bacterial conjunctivitis is an infection of the clear membrane that covers the white part of your eye and the inner surface of your eyelid (conjunctiva). When the blood vessels in your conjunctiva become inflamed, your eye becomes red or pink, and it will probably feel itchy. Bacterial conjunctivitis spreads very easily from person to person (is contagious). It also spreads easily from one eye to the other eye. What are the causes? This condition is caused by bacteria. You may get the infection if you come into close contact with:  A person who is infected with the bacteria.  Items that are contaminated with the bacteria, such as a face towel, contact lens solution, or eye makeup. What increases the risk? You are more likely to develop this condition if you:  Are exposed to other people who have the infection.  Wear contact lenses.  Have a sinus infection.  Have had a recent eye injury or surgery.  Have a weak body defense system (immune system).  Have a medical condition that causes dry eyes. What are the signs or symptoms? Symptoms of this condition include:  Thick, yellowish discharge from the eye. This may turn into a crust on the eyelid overnight and cause your eyelids to stick together.  Tearing or watery eyes.  Itchy eyes.  Burning feeling in your eyes.  Eye redness.  Swollen eyelids.  Blurred vision.   How is this diagnosed? This condition is diagnosed based on your symptoms and medical history. Your health care provider may also take a sample of discharge from your eye to find the cause of your infection. This is rarely done. How is this treated? This condition may be treated with:  Antibiotic eye drops or ointment to clear the infection more quickly and prevent the spread of infection to others.  Oral antibiotic medicines to treat infections that do not respond to drops or ointments or that last longer than 10 days.  Cool, wet  cloths (cool compresses) placed on the eyes.  Artificial tears applied 2-6 times a day.   Follow these instructions at home: Medicines  Take or apply your antibiotic medicine as told by your health care provider. Do not stop taking or applying the antibiotic even if you start to feel better.  Take or apply over-the-counter and prescription medicines only as told by your health care provider.  Be very careful to avoid touching the edge of your eyelid with the eye-drop bottle or the ointment tube when you apply medicines to the affected eye. This will keep you from spreading the infection to your other eye or to other people. Managing discomfort  Gently wipe away any drainage from your eye with a warm, wet washcloth or a cotton ball.  Apply a clean, cool compress to your eye for 10-20 minutes, 3-4 times a day. General instructions  Do not wear contact lenses until the inflammation is gone and your health care provider says it is safe to wear them again. Ask your health care provider how to sterilize or replace your contact lenses before you use them again. Wear glasses until you can resume wearing contact lenses.  Avoid wearing eye makeup until the inflammation is gone. Throw away any old eye cosmetics that may be contaminated.  Change or wash your pillowcase every day.  Do not share towels or washcloths. This may spread the infection.  Wash your hands often with soap and water. Use paper towels to dry your hands.  Avoid touching or rubbing your   eyes.  Do not drive or use heavy machinery if your vision is blurred. Contact a health care provider if:  You have a fever.  Your symptoms do not get better after 10 days. Get help right away if you have:  A fever and your symptoms suddenly get worse.  Severe pain when you move your eye.  Facial pain, redness, or swelling.  Sudden loss of vision. Summary  Bacterial conjunctivitis is an infection of the clear membrane that covers  the white part of your eye and the inner surface of your eyelid (conjunctiva).  Bacterial conjunctivitis spreads very easily from person to person (is contagious).  Wash your hands often with soap and water. Use paper towels to dry your hands.  Take or apply your antibiotic medicine as told by your health care provider. Do not stop taking or applying the antibiotic even if you start to feel better.  Contact a health care provider if you have a fever or your symptoms do not get better after 10 days. This information is not intended to replace advice given to you by your health care provider. Make sure you discuss any questions you have with your health care provider. Document Revised: 09/05/2018 Document Reviewed: 12/21/2017 Elsevier Patient Education  2021 [employer]  

## 2020-08-26 NOTE — Progress Notes (Signed)
   BP (!) 128/56   Pulse (!) 107   Temp (!) 97.3 F (36.3 C)   Wt 163 lb 8 oz (74.2 kg)   SpO2 98%   BMI 24.86 kg/m    Subjective:    Patient ID: [name] [name], male    DOB: January 13, 1971, 50 y.o.   MRN: [id number]  HPI: [name] is a 50 y.o. male presenting on 08/26/2020 for Conjunctivitis   HPI   Pt had a negative covid 19 screening questionnaire.     Pt treated 07/21/20 for conjunctivitis with ofloxacin.  His eyes got better.  He thinks he is having the same symtpoms now.   He says symptoms started 2 or 3 days ago. He does not wear contacts.  He does wear glasses.      Relevant past medical, surgical, family and social history reviewed and updated as indicated. Interim medical history since our last visit reviewed. Allergies and medications reviewed and updated.  Review of Systems  Per HPI unless specifically indicated above     Objective:    BP (!) 128/56   Pulse (!) 107   Temp (!) 97.3 F (36.3 C)   Wt 163 lb 8 oz (74.2 kg)   SpO2 98%   BMI 24.86 kg/m   Wt Readings from Last 3 Encounters:  08/26/20 163 lb 8 oz (74.2 kg)  08/14/20 165 lb (74.8 kg)  08/12/20 167 lb (75.8 kg)    Physical Exam Constitutional:      General: He is not in acute distress.    Appearance: He is not ill-appearing.  HENT:     Head: Normocephalic and atraumatic.  Eyes:     General: Lids are normal.     Extraocular Movements: Extraocular movements intact.     Conjunctiva/sclera:     Right eye: Right conjunctiva is injected. Exudate present.     Left eye: Left conjunctiva is not injected. No exudate.    Pupils: Pupils are equal, round, and reactive to light.     Right eye: No fluorescein uptake.     Comments: 1 gtt proparacaine OD.    Neurological:     Mental Status: He is alert and oriented to person, place, and time.  Psychiatric:        Behavior: Behavior normal.      Hearing Screening   125Hz  250Hz  500Hz  1000Hz  2000Hz  3000Hz  4000Hz  6000Hz  8000Hz   Right ear:            Left ear:             Visual Acuity Screening   Right eye Left eye Both eyes  Without correction: 20/50 20/50   With correction: 20/30 20/40              Assessment & Plan:     Encounter Diagnosis  Name Primary?  . Acute conjunctivitis of right eye, unspecified acute conjunctivitis type Yes    rx erythromycin ointment.  Pt was counseled on hygiene and keeping his hands out of his eyes.  He is encouraged to wash hands prior to handling his eye.   He is given reading information on conjunctivitis.  Pt to follow up as scheduled.  He is to contact office sooner prn

## 2020-09-02 ENCOUNTER — Telehealth: Payer: Self-pay

## 2020-09-02 NOTE — Telephone Encounter (Signed)
Attempted to call client for follow up. No answer, left voicemail.   [name] RN [employer]

## 2020-09-03 ENCOUNTER — Other Ambulatory Visit: Payer: Self-pay

## 2020-09-03 ENCOUNTER — Other Ambulatory Visit (HOSPITAL_COMMUNITY)
Admission: RE | Admit: 2020-09-03 | Discharge: 2020-09-03 | Disposition: A | Discharge status: Home / Self Care | Payer: Self-pay | Source: Ambulatory Visit | Attending: Physician Assistant | Admitting: Physician Assistant

## 2020-09-03 ENCOUNTER — Ambulatory Visit: Payer: Self-pay | Admitting: Physician Assistant

## 2020-09-03 ENCOUNTER — Encounter (HOSPITAL_COMMUNITY): Payer: Self-pay

## 2020-09-03 ENCOUNTER — Encounter: Payer: Self-pay | Admitting: Physician Assistant

## 2020-09-03 ENCOUNTER — Emergency Department (HOSPITAL_COMMUNITY): Payer: Self-pay

## 2020-09-03 ENCOUNTER — Inpatient Hospital Stay (HOSPITAL_COMMUNITY)
Admission: EM | Admit: 2020-09-03 | Discharge: 2020-09-05 | DRG: 872 | Disposition: A | Discharge status: Home / Self Care | Payer: Self-pay | Attending: Family Medicine | Admitting: Family Medicine

## 2020-09-03 VITALS — BP 97/64 | HR 107 | Temp 97.7°F | Wt 163.0 lb

## 2020-09-03 DIAGNOSIS — M109 Gout, unspecified: Secondary | ICD-10-CM | POA: Diagnosis present

## 2020-09-03 DIAGNOSIS — F322 Major depressive disorder, single episode, severe without psychotic features: Secondary | ICD-10-CM | POA: Diagnosis present

## 2020-09-03 DIAGNOSIS — R6520 Severe sepsis without septic shock: Secondary | ICD-10-CM | POA: Diagnosis present

## 2020-09-03 DIAGNOSIS — L723 Sebaceous cyst: Secondary | ICD-10-CM | POA: Diagnosis present

## 2020-09-03 DIAGNOSIS — G4730 Sleep apnea, unspecified: Secondary | ICD-10-CM | POA: Diagnosis present

## 2020-09-03 DIAGNOSIS — K2270 Barrett's esophagus without dysplasia: Secondary | ICD-10-CM

## 2020-09-03 DIAGNOSIS — E785 Hyperlipidemia, unspecified: Secondary | ICD-10-CM | POA: Diagnosis present

## 2020-09-03 DIAGNOSIS — F17200 Nicotine dependence, unspecified, uncomplicated: Secondary | ICD-10-CM | POA: Diagnosis present

## 2020-09-03 DIAGNOSIS — Z8249 Family history of ischemic heart disease and other diseases of the circulatory system: Secondary | ICD-10-CM

## 2020-09-03 DIAGNOSIS — I2510 Atherosclerotic heart disease of native coronary artery without angina pectoris: Secondary | ICD-10-CM | POA: Diagnosis present

## 2020-09-03 DIAGNOSIS — K529 Noninfective gastroenteritis and colitis, unspecified: Secondary | ICD-10-CM | POA: Diagnosis present

## 2020-09-03 DIAGNOSIS — G43909 Migraine, unspecified, not intractable, without status migrainosus: Secondary | ICD-10-CM | POA: Diagnosis present

## 2020-09-03 DIAGNOSIS — E1165 Type 2 diabetes mellitus with hyperglycemia: Secondary | ICD-10-CM

## 2020-09-03 DIAGNOSIS — F329 Major depressive disorder, single episode, unspecified: Secondary | ICD-10-CM | POA: Diagnosis present

## 2020-09-03 DIAGNOSIS — Z83438 Family history of other disorder of lipoprotein metabolism and other lipidemia: Secondary | ICD-10-CM

## 2020-09-03 DIAGNOSIS — Z9119 Patient's noncompliance with other medical treatment and regimen: Secondary | ICD-10-CM

## 2020-09-03 DIAGNOSIS — N179 Acute kidney failure, unspecified: Secondary | ICD-10-CM | POA: Diagnosis present

## 2020-09-03 DIAGNOSIS — K3184 Gastroparesis: Secondary | ICD-10-CM | POA: Diagnosis present

## 2020-09-03 DIAGNOSIS — I10 Essential (primary) hypertension: Secondary | ICD-10-CM | POA: Diagnosis present

## 2020-09-03 DIAGNOSIS — G8929 Other chronic pain: Secondary | ICD-10-CM | POA: Diagnosis present

## 2020-09-03 DIAGNOSIS — R1111 Vomiting without nausea: Secondary | ICD-10-CM

## 2020-09-03 DIAGNOSIS — Z7982 Long term (current) use of aspirin: Secondary | ICD-10-CM

## 2020-09-03 DIAGNOSIS — F17210 Nicotine dependence, cigarettes, uncomplicated: Secondary | ICD-10-CM | POA: Diagnosis present

## 2020-09-03 DIAGNOSIS — E1143 Type 2 diabetes mellitus with diabetic autonomic (poly)neuropathy: Secondary | ICD-10-CM | POA: Diagnosis present

## 2020-09-03 DIAGNOSIS — M1990 Unspecified osteoarthritis, unspecified site: Secondary | ICD-10-CM | POA: Diagnosis present

## 2020-09-03 DIAGNOSIS — R000 Tachycardia, unspecified: Secondary | ICD-10-CM

## 2020-09-03 DIAGNOSIS — J449 Chronic obstructive pulmonary disease, unspecified: Secondary | ICD-10-CM | POA: Diagnosis present

## 2020-09-03 DIAGNOSIS — A419 Sepsis, unspecified organism: Principal | ICD-10-CM | POA: Diagnosis present

## 2020-09-03 DIAGNOSIS — N492 Inflammatory disorders of scrotum: Secondary | ICD-10-CM | POA: Diagnosis present

## 2020-09-03 DIAGNOSIS — Z20822 Contact with and (suspected) exposure to covid-19: Secondary | ICD-10-CM | POA: Diagnosis present

## 2020-09-03 DIAGNOSIS — F419 Anxiety disorder, unspecified: Secondary | ICD-10-CM | POA: Diagnosis present

## 2020-09-03 DIAGNOSIS — Z7951 Long term (current) use of inhaled steroids: Secondary | ICD-10-CM

## 2020-09-03 DIAGNOSIS — T383X6A Underdosing of insulin and oral hypoglycemic [antidiabetic] drugs, initial encounter: Secondary | ICD-10-CM | POA: Diagnosis present

## 2020-09-03 DIAGNOSIS — F810 Specific reading disorder: Secondary | ICD-10-CM

## 2020-09-03 DIAGNOSIS — E1110 Type 2 diabetes mellitus with ketoacidosis without coma: Secondary | ICD-10-CM

## 2020-09-03 DIAGNOSIS — E860 Dehydration: Secondary | ICD-10-CM

## 2020-09-03 DIAGNOSIS — R5383 Other fatigue: Secondary | ICD-10-CM

## 2020-09-03 DIAGNOSIS — K219 Gastro-esophageal reflux disease without esophagitis: Secondary | ICD-10-CM | POA: Diagnosis present

## 2020-09-03 DIAGNOSIS — Z79899 Other long term (current) drug therapy: Secondary | ICD-10-CM

## 2020-09-03 DIAGNOSIS — M546 Pain in thoracic spine: Secondary | ICD-10-CM | POA: Diagnosis present

## 2020-09-03 DIAGNOSIS — Z794 Long term (current) use of insulin: Secondary | ICD-10-CM

## 2020-09-03 DIAGNOSIS — Z833 Family history of diabetes mellitus: Secondary | ICD-10-CM

## 2020-09-03 LAB — CBC WITH DIFFERENTIAL/PLATELET
Abs Immature Granulocytes: 0.1 10*3/uL — ABNORMAL HIGH (ref 0.00–0.07)
Abs Immature Granulocytes: 0.06 10*3/uL (ref 0.00–0.07)
Basophils Absolute: 0.1 10*3/uL (ref 0.0–0.1)
Basophils Absolute: 0.1 10*3/uL (ref 0.0–0.1)
Basophils Relative: 1 %
Basophils Relative: 0 %
Eosinophils Absolute: 0.1 10*3/uL (ref 0.0–0.5)
Eosinophils Absolute: 0.1 10*3/uL (ref 0.0–0.5)
Eosinophils Relative: 1 %
Eosinophils Relative: 0 %
HCT: 44.3 % (ref 39.0–52.0)
HCT: 43.6 % (ref 39.0–52.0)
Hemoglobin: 15 g/dL (ref 13.0–17.0)
Hemoglobin: 14.8 g/dL (ref 13.0–17.0)
Immature Granulocytes: 1 %
Immature Granulocytes: 0 %
Lymphocytes Relative: 10 %
Lymphocytes Relative: 13 %
Lymphs Abs: 2.2 10*3/uL (ref 0.7–4.0)
Lymphs Abs: 2.3 10*3/uL (ref 0.7–4.0)
MCH: 31.4 pg (ref 26.0–34.0)
MCH: 31.3 pg (ref 26.0–34.0)
MCHC: 33.9 g/dL (ref 30.0–36.0)
MCHC: 33.9 g/dL (ref 30.0–36.0)
MCV: 92.7 fL (ref 80.0–100.0)
MCV: 92.2 fL (ref 80.0–100.0)
Monocytes Absolute: 0.9 10*3/uL (ref 0.1–1.0)
Monocytes Absolute: 0.5 10*3/uL (ref 0.1–1.0)
Monocytes Relative: 4 %
Monocytes Relative: 3 %
Neutro Abs: 18.2 10*3/uL — ABNORMAL HIGH (ref 1.7–7.7)
Neutro Abs: 15.3 10*3/uL — ABNORMAL HIGH (ref 1.7–7.7)
Neutrophils Relative %: 83 %
Neutrophils Relative %: 84 %
Platelets: 309 10*3/uL (ref 150–400)
Platelets: 287 10*3/uL (ref 150–400)
RBC: 4.78 MIL/uL (ref 4.22–5.81)
RBC: 4.73 MIL/uL (ref 4.22–5.81)
RDW: 12.1 % (ref 11.5–15.5)
RDW: 12.2 % (ref 11.5–15.5)
WBC: 21.6 10*3/uL — ABNORMAL HIGH (ref 4.0–10.5)
WBC: 18.3 10*3/uL — ABNORMAL HIGH (ref 4.0–10.5)
nRBC: 0 % (ref 0.0–0.2)
nRBC: 0 % (ref 0.0–0.2)

## 2020-09-03 LAB — COMPREHENSIVE METABOLIC PANEL
ALT: 18 U/L (ref 0–44)
AST: 14 U/L — ABNORMAL LOW (ref 15–41)
Albumin: 3.3 g/dL — ABNORMAL LOW (ref 3.5–5.0)
Alkaline Phosphatase: 100 U/L (ref 38–126)
Anion gap: 12 (ref 5–15)
BUN: 18 mg/dL (ref 6–20)
CO2: 24 mmol/L (ref 22–32)
Calcium: 8.6 mg/dL — ABNORMAL LOW (ref 8.9–10.3)
Chloride: 92 mmol/L — ABNORMAL LOW (ref 98–111)
Creatinine, Ser: 1.74 mg/dL — ABNORMAL HIGH (ref 0.61–1.24)
GFR, Estimated: 47 mL/min — ABNORMAL LOW (ref >60)
Glucose, Bld: 568 mg/dL (ref 70–99)
Potassium: 5.6 mmol/L — ABNORMAL HIGH (ref 3.5–5.1)
Sodium: 128 mmol/L — ABNORMAL LOW (ref 135–145)
Total Bilirubin: 0.9 mg/dL (ref 0.3–1.2)
Total Protein: 6.8 g/dL (ref 6.5–8.1)

## 2020-09-03 LAB — PROTIME-INR
INR: 1 (ref 0.8–1.2)
Prothrombin Time: 12.7 seconds (ref 11.4–15.2)

## 2020-09-03 LAB — BASIC METABOLIC PANEL
Anion gap: 12 (ref 5–15)
BUN: 17 mg/dL (ref 6–20)
CO2: 25 mmol/L (ref 22–32)
Calcium: 8.5 mg/dL — ABNORMAL LOW (ref 8.9–10.3)
Chloride: 92 mmol/L — ABNORMAL LOW (ref 98–111)
Creatinine, Ser: 1.74 mg/dL — ABNORMAL HIGH (ref 0.61–1.24)
GFR, Estimated: 47 mL/min — ABNORMAL LOW (ref >60)
Glucose, Bld: 577 mg/dL (ref 70–99)
Potassium: 4.9 mmol/L (ref 3.5–5.1)
Sodium: 129 mmol/L — ABNORMAL LOW (ref 135–145)

## 2020-09-03 LAB — CBG MONITORING, ED
Glucose-Capillary: 297 mg/dL — ABNORMAL HIGH (ref 70–99)
Glucose-Capillary: 202 mg/dL — ABNORMAL HIGH (ref 70–99)

## 2020-09-03 LAB — POCT URINALYSIS DIPSTICK
Bilirubin, UA: NEGATIVE
Glucose, UA: POSITIVE — AB
Ketones, UA: NEGATIVE
Leukocytes, UA: NEGATIVE
Nitrite, UA: NEGATIVE
Protein, UA: NEGATIVE
Spec Grav, UA: 1.015 (ref 1.010–1.025)
Urobilinogen, UA: NEGATIVE E.U./dL — AB
pH, UA: 5 (ref 5.0–8.0)

## 2020-09-03 LAB — RESP PANEL BY RT-PCR (FLU A&B, COVID) ARPGX2
Influenza A by PCR: NEGATIVE
Influenza B by PCR: NEGATIVE
SARS Coronavirus 2 by RT PCR: NEGATIVE

## 2020-09-03 LAB — APTT: aPTT: 27 seconds (ref 24–36)

## 2020-09-03 LAB — LACTIC ACID, PLASMA
Lactic Acid, Venous: 2 mmol/L (ref 0.5–1.9)
Lactic Acid, Venous: 2.4 mmol/L (ref 0.5–1.9)
Lactic Acid, Venous: 2 mmol/L (ref 0.5–1.9)

## 2020-09-03 MED ORDER — LACTATED RINGERS IV BOLUS (SEPSIS)
1200.0000 mL | Freq: Once | INTRAVENOUS | Status: AC
  Administered 2020-09-03: 1200 mL via INTRAVENOUS

## 2020-09-03 MED ORDER — SODIUM CHLORIDE 0.9 % IV BOLUS: 1000.0000 mL | Freq: Once | INTRAVENOUS | Status: DC

## 2020-09-03 MED ORDER — ATORVASTATIN CALCIUM 40 MG PO TABS
80.0000 mg | ORAL_TABLET | Freq: Every day | ORAL | Status: DC
  Administered 2020-09-04 – 2020-09-05 (×2): 80 mg via ORAL
  Filled 2020-09-03 (×3): qty 2

## 2020-09-03 MED ORDER — INSULIN ASPART 100 UNIT/ML ~~LOC~~ SOLN
0.0000 [IU] | Freq: Three times a day (TID) | SUBCUTANEOUS | Status: DC
  Administered 2020-09-04 (×2): 2 [IU] via SUBCUTANEOUS

## 2020-09-03 MED ORDER — ASPIRIN EC 81 MG PO TBEC
81.0000 mg | DELAYED_RELEASE_TABLET | Freq: Every day | ORAL | Status: DC
  Administered 2020-09-03 – 2020-09-04 (×2): 81 mg via ORAL
  Filled 2020-09-03 (×4): qty 1

## 2020-09-03 MED ORDER — PIPERACILLIN-TAZOBACTAM 3.375 G IVPB
3.3750 g | Freq: Once | INTRAVENOUS | Status: AC
  Administered 2020-09-03: 3.375 g via INTRAVENOUS
  Filled 2020-09-03: qty 50

## 2020-09-03 MED ORDER — INSULIN ASPART 100 UNIT/ML ~~LOC~~ SOLN
10.0000 [IU] | Freq: Once | SUBCUTANEOUS | Status: AC
  Administered 2020-09-03: 10 [IU] via SUBCUTANEOUS
  Filled 2020-09-03: qty 1

## 2020-09-03 MED ORDER — PIPERACILLIN-TAZOBACTAM 3.375 G IVPB 30 MIN
3.3750 g | Freq: Once | INTRAVENOUS | Status: DC
  Filled 2020-09-03: qty 50

## 2020-09-03 MED ORDER — ACETAMINOPHEN 650 MG RE SUPP: 650.0000 mg | Freq: Four times a day (QID) | RECTAL | Status: DC | PRN

## 2020-09-03 MED ORDER — LACTATED RINGERS IV BOLUS (SEPSIS)
1000.0000 mL | Freq: Once | INTRAVENOUS | Status: AC
  Administered 2020-09-03: 1000 mL via INTRAVENOUS

## 2020-09-03 MED ORDER — LACTATED RINGERS IV SOLN: INTRAVENOUS | Status: DC

## 2020-09-03 MED ORDER — INSULIN GLARGINE 100 UNIT/ML ~~LOC~~ SOLN
50.0000 [IU] | Freq: Every day | SUBCUTANEOUS | Status: DC
  Administered 2020-09-03 – 2020-09-04 (×2): 50 [IU] via SUBCUTANEOUS
  Filled 2020-09-03 (×5): qty 0.5

## 2020-09-03 MED ORDER — INSULIN ASPART 100 UNIT/ML ~~LOC~~ SOLN
0.0000 [IU] | Freq: Every day | SUBCUTANEOUS | Status: DC
Start: 2020-09-03 — End: 2020-09-05
  Administered 2020-09-03: 2 [IU] via SUBCUTANEOUS
  Filled 2020-09-03: qty 1

## 2020-09-03 MED ORDER — PIPERACILLIN-TAZOBACTAM 3.375 G IVPB
3.3750 g | Freq: Three times a day (TID) | INTRAVENOUS | Status: DC
  Administered 2020-09-03 – 2020-09-05 (×5): 3.375 g via INTRAVENOUS
  Filled 2020-09-03 (×5): qty 50

## 2020-09-03 MED ORDER — LIDOCAINE-EPINEPHRINE (PF) 2 %-1:200000 IJ SOLN
20.0000 mL | Freq: Once | INTRAMUSCULAR | Status: AC
  Administered 2020-09-03: 20 mL
  Filled 2020-09-03: qty 20

## 2020-09-03 MED ORDER — INSULIN GLARGINE 100 UNIT/ML SOLOSTAR PEN: 50.0000 [IU] | PEN_INJECTOR | Freq: Every day | SUBCUTANEOUS | Status: DC

## 2020-09-03 MED ORDER — ENOXAPARIN SODIUM 40 MG/0.4ML ~~LOC~~ SOLN
40.0000 mg | SUBCUTANEOUS | Status: DC
  Administered 2020-09-04 – 2020-09-05 (×2): 40 mg via SUBCUTANEOUS
  Filled 2020-09-03 (×3): qty 0.4

## 2020-09-03 MED ORDER — METOCLOPRAMIDE HCL 10 MG PO TABS
5.0000 mg | ORAL_TABLET | Freq: Three times a day (TID) | ORAL | Status: DC
  Administered 2020-09-03 – 2020-09-05 (×7): 5 mg via ORAL
  Filled 2020-09-03 (×7): qty 1

## 2020-09-03 MED ORDER — SERTRALINE HCL 50 MG PO TABS
100.0000 mg | ORAL_TABLET | Freq: Every day | ORAL | Status: DC
  Administered 2020-09-04 – 2020-09-05 (×2): 100 mg via ORAL
  Filled 2020-09-03 (×2): qty 2

## 2020-09-03 MED ORDER — SODIUM CHLORIDE 0.9 % IV BOLUS
500.0000 mL | Freq: Once | INTRAVENOUS | Status: AC
  Administered 2020-09-03: 500 mL via INTRAVENOUS

## 2020-09-03 MED ORDER — ACETAMINOPHEN 325 MG PO TABS: 650.0000 mg | ORAL_TABLET | Freq: Four times a day (QID) | ORAL | Status: DC | PRN

## 2020-09-03 MED ORDER — MORPHINE SULFATE (PF) 4 MG/ML IV SOLN
4.0000 mg | Freq: Once | INTRAVENOUS | Status: AC
  Administered 2020-09-03: 4 mg via INTRAVENOUS
  Filled 2020-09-03: qty 1

## 2020-09-03 NOTE — H&P (Signed)
History and Physical    [name] [name] QPY:[id number] DOB: 1970/09/18 DOA: 09/03/2020  PCP: [name], PA-C   Patient coming from: Home  I have personally briefly reviewed patient's old medical records in [facility]  Chief Complaint: Abnormal lab, scrotal swelling.  HPI: [name] [name] is a 50 y.o. male with medical history significant for diabetes mellitus with gastroparesis, coronary artery disease, hypertension, COPD, depression. Patient presented to the ED with reports of abnormal labs and swelling in his groin.  Patient was told that his creatinine and white blood cell counts we are both elevated.  Both reports that he has had a swelling/mass in his left groin of at least 3 weeks duration.  He also reports brownish drainage from the swelling.  Patient was referred to general surgery, he saw Dr. Fayrene Fearing, and was referred to urology.  Patient reports shaking chills, but no fevers. Also  reports vomiting, but this is almost a daily occurrence  after eating in the evenings.  He has baseline/chronic intermittent diarrhea.  He has had 2 bowel movements today. Patient is not compliant with his medications which include his insulins.  ED Course: 98.6.  Heart rate 92-108.  Leak 93-145.  O2 sats greater than 96% on room air.  WBC 18.3.  Blood glucose 577.  Creatinine 1.74. Lactic acid 2.  Blood glucose 568. Urologist Dr. Ronne Binning consulted in the ED,  Review of Systems: As per HPI all other systems reviewed and negative.  Past Medical History:  Diagnosis Date  . Anxiety   . Arthritis   . CAD (coronary artery disease)    Moderate LAD disease 2016 - Dr. Jacinto Halim  . Chronic bronchitis (HCC)   . Chronic upper back pain   . COPD (chronic obstructive pulmonary disease) (HCC)   . Depression   . Diabetic peripheral neuropathy (HCC)   . GERD (gastroesophageal reflux disease)   . History of gout   . Hyperlipemia   . Hypertension   . Migraine   . Noncompliance   . Ringing in the ears,  bilateral   . Sleep apnea 2016   "could not afford CPAP".  . Type 2 diabetes mellitus (HCC)     Past Surgical History:  Procedure Laterality Date  . ANKLE SURGERY Right 1982   "had extra bones in there; took them out"  . APPENDECTOMY  1975  . BIOPSY  12/26/2018   Procedure: BIOPSY;  Surgeon: Malissa Hippo, MD;  Location: [facility];  Service: Endoscopy;;  duodenal biopsies  . BIOPSY  03/23/2019   Procedure: BIOPSY;  Surgeon: Malissa Hippo, MD;  Location: [facility];  Service: Endoscopy;;  esophagus  . CARDIAC CATHETERIZATION N/A 03/28/2015   Procedure: Left Heart Cath and Coronary Angiography;  Surgeon: Yates Decamp, MD;  Location: [facility] INVASIVE CV LAB;  Service: Cardiovascular;  Laterality: N/A;  . CARDIAC CATHETERIZATION N/A 03/28/2015   Procedure: Intravascular Pressure Wire/FFR Study;  Surgeon: Yates Decamp, MD;  Location: [facility] INVASIVE CV LAB;  Service: Cardiovascular;  Laterality: N/A;  . CARPAL TUNNEL RELEASE Left ~ 2008  . COLONOSCOPY WITH ESOPHAGOGASTRODUODENOSCOPY (EGD)    . COLONOSCOPY WITH PROPOFOL N/A 12/24/2019   Procedure: COLONOSCOPY WITH PROPOFOL;  Surgeon: Malissa Hippo, MD;  Location: [facility];  Service: Endoscopy;  Laterality: N/A;  955  . ELBOW FRACTURE SURGERY Left ~ 2008  . ESOPHAGOGASTRODUODENOSCOPY N/A 12/26/2018   Procedure: ESOPHAGOGASTRODUODENOSCOPY (EGD);  Surgeon: Malissa Hippo, MD;  Location: [facility];  Service: Endoscopy;  Laterality:  N/A;  . ESOPHAGOGASTRODUODENOSCOPY (EGD) WITH PROPOFOL N/A 03/23/2019   Procedure: ESOPHAGOGASTRODUODENOSCOPY (EGD) WITH PROPOFOL;  Surgeon: Malissa Hippo, MD;  Location: [facility];  Service: Endoscopy;  Laterality: N/A;  7:30  . FRACTURE SURGERY     ankle and elbow  . PILONIDAL CYST EXCISION N/A 03/24/2017   Procedure: EXCISION CHRONIC  PILONIDAL ABSCESS;  Surgeon: [name] Miyamoto, MD;  Location: [facility];  Service: General;  Laterality: N/A;  . TENDON REPAIR Left ~ 2004   "main tendon in my ankle"      reports that he has been smoking cigarettes. He has a 17.00 pack-year smoking history. He has never used smokeless tobacco. He reports that he does not drink alcohol and does not use drugs.  Allergies  Allergen Reactions  . Omeprazole Magnesium Swelling    Face swells, no breathing impairment  . Bee Venom   . Esomeprazole Swelling    Face swells, no breathing impairment    Family History  Problem Relation Age of Onset  . Congestive Heart Failure Sister   . Diabetes Mother   . Hypertension Mother   . Cancer Mother   . Diabetes Father   . Hypertension Father   . Hyperlipidemia Father     Prior to Admission medications   Medication Sig Start Date End Date Taking? Authorizing Provider  acetaminophen (TYLENOL) 325 MG tablet Take 2 tablets (650 mg total) by mouth every 6 (six) hours as needed for moderate pain. Patient taking differently: Take 650-975 mg by mouth every 6 (six) hours as needed for moderate pain. 03/25/18   Elgergawy, Leana Roe, MD  albuterol (PROVENTIL HFA) 108 (90 Base) MCG/ACT inhaler INHALE 2 PUFFS BY MOUTH EVERY 6 HOURS AS NEEDED FOR COUGHING, WHEEZING, OR SHORTNESS OF BREATH Patient taking differently: Inhale 2 puffs into the lungs every 6 (six) hours as needed for wheezing or shortness of breath. 07/09/20   [name], PA-C  ARIPiprazole (ABILIFY) 5 MG tablet Take 5 mg by mouth daily.    [provider]  aspirin 81 MG tablet Take 1 tablet (81 mg total) by mouth at bedtime. 03/24/19   Malissa Hippo, MD  atorvastatin (LIPITOR) 80 MG tablet TAKE 1 Tablet BY MOUTH ONCE EVERY DAY 07/09/20   [name], PA-C  Carboxymethylcellul-Glycerin (LUBRICATING EYE DROPS OP) Place 1 drop into both eyes daily as needed (itching eyes).    [provider]  DULERA 100-5 MCG/ACT AERO INHALE 2 PUFFS BY MOUTH TWICE DAILY. RINSE MOUTH AFTER EACH USE Patient taking differently: Inhale 2 puffs into the lungs 2 (two) times daily. 10/10/19   [name],  PA-C  erythromycin ophthalmic ointment Place 1 application into the right eye 4 (four) times daily. For 5 - 7 days 08/26/20   [name], PA-C  ibuprofen (ADVIL) 200 MG tablet Take 400-600 mg by mouth every 6 (six) hours as needed for moderate pain.    [provider]  insulin aspart (NOVOLOG) 100 UNIT/ML injection Inject 14-20 Units into the skin 3 (three) times daily before meals. 05/08/20   Roma Kayser, MD  insulin glargine (LANTUS SOLOSTAR) 100 UNIT/ML Solostar Pen Inject 60 Units into the skin at bedtime. Patient taking differently: Inject 65 Units into the skin at bedtime. 05/08/20   Roma Kayser, MD  isosorbide mononitrate (IMDUR) 30 MG 24 hr tablet Take 1 tablet (30 mg total) by mouth daily. 08/17/19 12/17/19  Jonelle Sidle, MD  metFORMIN (GLUCOPHAGE) 500 MG tablet Take 1 tablet (500 mg total) by  mouth 2 (two) times daily with a meal. 01/17/20   Nida, Denman George, MD  metoCLOPramide (REGLAN) 5 MG tablet Take 1 tablet (5 mg total) by mouth 4 (four) times daily -  before meals and at bedtime. 11/22/19   Ardelia Mems, PA-C  nitroGLYCERIN (NITROSTAT) 0.4 MG SL tablet Place 1 tablet (0.4 mg total) under the tongue every 5 (five) minutes as needed for chest pain. 10/05/17   [name], PA-C  pantoprazole (PROTONIX) 40 MG tablet Take 1 tablet (40 mg total) by mouth daily. 07/30/20   [name], PA-C  promethazine (PHENERGAN) 25 MG suppository Place 1 suppository (25 mg total) rectally every 8 (eight) hours as needed for refractory nausea / vomiting. 12/20/18   Vassie Loll, MD  promethazine (PHENERGAN) 25 MG tablet Take 25 mg by mouth every 8 (eight) hours as needed for nausea or vomiting.    [provider]  sertraline (ZOLOFT) 100 MG tablet Take 1 tablet by mouth daily. 01/09/20   [provider]    Physical Exam: Vitals:   09/03/20 1515 09/03/20 1530 09/03/20 1545 09/03/20 1600  BP: (!) 145/100 116/81  128/89  Pulse: 94 92 94  98  Resp: 18 13 13 15   Temp:      TempSrc:      SpO2: 97% 98% 99% 98%  Weight:      Height:        Constitutional: NAD, calm, comfortable Vitals:   09/03/20 1515 09/03/20 1530 09/03/20 1545 09/03/20 1600  BP: (!) 145/100 116/81  128/89  Pulse: 94 92 94 98  Resp: 18 13 13 15   Temp:      TempSrc:      SpO2: 97% 98% 99% 98%  Weight:      Height:       Eyes: PERRL, lids and conjunctivae normal ENMT: Mucous membranes are moist.  Neck: normal, supple, no masses, no thyromegaly Respiratory: clear to auscultation bilaterally, no wheezing, no crackles. Normal respiratory effort. No accessory muscle use.  Cardiovascular: Regular rate and rhythm, no murmurs / rubs / gallops. No extremity edema. 2+ pedal pulses.  Abdomen: soft, no tenderness, no masses palpated. No hepatosplenomegaly. Bowel sounds positive.  Musculoskeletal: no clubbing / cyanosis. No joint deformity upper and lower extremities. Good ROM, no contractures.  Skin: Clean dressing to groin, no rashes, lesions, ulcers. No induration Neurologic: No apparent cranial abnormality, moving extremities spontaneously. Psychiatric: Normal judgment and insight. Alert and oriented x 3. Normal mood.   Labs on Admission: I have personally reviewed following labs and imaging studies  CBC: Recent Labs  Lab 09/03/20 1225 09/03/20 1443  WBC 21.6* 18.3*  NEUTROABS 18.2* 15.3*  HGB 15.0 14.8  HCT 44.3 43.6  MCV 92.7 92.2  PLT 309 287   Basic Metabolic Panel: Recent Labs  Lab 09/03/20 1225 09/03/20 1443  NA 129* 128*  K 4.9 5.6*  CL 92* 92*  CO2 25 24  GLUCOSE 577* 568*  BUN 17 18  CREATININE 1.74* 1.74*  CALCIUM 8.5* 8.6*   Liver Function Tests: Recent Labs  Lab 09/03/20 1443  AST 14*  ALT 18  ALKPHOS 100  BILITOT 0.9  PROT 6.8  ALBUMIN 3.3*   Coagulation Profile: Recent Labs  Lab 09/03/20 1443  INR 1.0    Radiological Exams on Admission: DG Chest Port 1 View  Result Date: 09/03/2020 CLINICAL DATA:   Dehydration EXAM: PORTABLE CHEST 1 VIEW COMPARISON:  December 23, 2018 FINDINGS: No edema or airspace opacity. Heart size and pulmonary  vascularity are normal. No adenopathy. No bone lesions. IMPRESSION: No edema or airspace opacity. Cardiac silhouette within normal limits. Electronically Signed   By: Bretta BangWilliam  Woodruff III M.D.   On: 09/03/2020 15:52    EKG: Independently reviewed.  Sinus rhythm rate 99.  QTc 487.  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  No significant change from prior.  Assessment/Plan Principal Problem:   Severe sepsis (HCC) Active Problems:   ARF (acute renal failure) (HCC)   Diabetic gastroparesis (HCC)   CAD (coronary artery disease)   Current smoker   Uncontrolled type 2 diabetes mellitus with hyperglycemia (HCC)   MDD (major depressive disorder), severe (HCC)  Severe sepsis/scrotal abscess-leukocytosis of 18.3, tachycardia 92 to 108. With evidence of endorgan dysfunction- AKI and lactic acidosis of 2.  Source of infection likely Scrotal abscess- ultrasound 08/12/2020-shows complex fluid collection within the left posterior and lateral scrotal wall measuring up to 5.8 cm which may reflect abscess. -Continue IV Zosyn -BMP, CBC in the morning -Urology to see -Obtain repeat scrotal ultrasound - 2. 2 sepsis fluid bolus given, continue R L 100cc/hr x 20 hrs -Trend lactic acid -Follow-up blood and urine cultures -I&D of scrotal abscess done by urologist in ED , does not appear samples for cultures were obtained.   Acute renal failure-creatinine 1.7 baseline ~ 1.  Likely secondary to severe sepsis, dehydration from hyperglycemia. -IV fluids  Diabetes with hyperglycemia, diabetic gastroparesis -blood glucose 577 >> 297 after sepsis fluid bolus and 10 U insulin given.  Reports some compliance with his insulin. - SSI- M - HgbA1c -Hold home Metformin -Resume Lantus at 50 units nightly(patient reports home dose 65 units nightly) -Resume Reglan  Pseudohyponatremia- 129, sodium 137 when corrected  for hyperglycemia.  Depression -Resume Zoloft.  Hypertension-stable.  Not on antihypertensives.  DVT prophylaxis: Lovenox Code Status: Full code Family Communication: None at bedside Disposition Plan:  ~/>  2 days Consults called: Urology Admission status: Inpatient, telemetry I certify that at the point of admission it is my clinical judgment that the patient will require inpatient hospital care spanning beyond 2 midnights from the point of admission due to high intensity of service, high risk for further deterioration and high frequency of surveillance required. The following factors support the patient status of inpatient:    Onnie BoerEjiroghene E Charlene Detter MD [facility]  09/03/2020, 7:05 PM

## 2020-09-03 NOTE — ED Provider Notes (Signed)
[facility] because his lab testing showed him to be dehydrated.  He also complains of a fever.  Physical exam patient alert lungs clear abdomen nontender.  Will order CBC c-Met and urinalysis   [name], MD 09/03/20 1419

## 2020-09-03 NOTE — ED Notes (Signed)
Called [name] for Aspirin

## 2020-09-03 NOTE — Patient Instructions (Signed)
Dehydration, Adult Dehydration is condition in which there is not enough water or other fluids in the body. This happens when a person loses more fluids than he or she takes in. Important body parts cannot work right without the right amount of fluids. Any loss of fluids from the body can cause dehydration. Dehydration can be mild, worse, or very bad. It should be treated right away to keep it from getting very bad. What are the causes? This condition may be caused by:  Conditions that cause loss of water or other fluids, such as: ? Watery poop (diarrhea). ? Vomiting. ? Sweating a lot. ? Peeing (urinating) a lot.  Not drinking enough fluids, especially when you: ? Are ill. ? Are doing things that take a lot of energy to do.  Other illnesses and conditions, such as fever or infection.  Certain medicines, such as medicines that take extra fluid out of the body (diuretics).  Lack of safe drinking water.  Not being able to get enough water and food. What increases the risk? The following factors may make you more likely to develop this condition:  Having a long-term (chronic) illness that has not been treated the right way, such as: ? Diabetes. ? Heart disease. ? Kidney disease.  Being 65 years of age or older.  Having a disability.  Living in a place that is high above the ground or sea (high in altitude). The thinner, dried air causes more fluid loss.  Doing exercises that put stress on your body for a long time. What are the signs or symptoms? Symptoms of dehydration depend on how bad it is. Mild or worse dehydration  Thirst.  Dry lips or dry mouth.  Feeling dizzy or light-headed, especially when you stand up from sitting.  Muscle cramps.  Your body making: ? Dark pee (urine). Pee may be the color of tea. ? Less pee than normal. ? Less tears than normal.  Headache. Very bad dehydration  Changes in skin. Skin may: ? Be cold to the touch (clammy). ? Be blotchy  or pale. ? Not go back to normal right after you lightly pinch it and let it go.  Little or no tears, pee, or sweat.  Changes in vital signs, such as: ? Fast breathing. ? Low blood pressure. ? Weak pulse. ? Pulse that is more than 100 beats a minute when you are sitting still.  Other changes, such as: ? Feeling very thirsty. ? Eyes that look hollow (sunken). ? Cold hands and feet. ? Being mixed up (confused). ? Being very tired (lethargic) or having trouble waking from sleep. ? Short-term weight loss. ? Loss of consciousness. How is this treated? Treatment for this condition depends on how bad it is. Treatment should start right away. Do not wait until your condition gets very bad. Very bad dehydration is an emergency. You will need to go to a hospital.  Mild or worse dehydration can be treated at home. You may be asked to: ? Drink more fluids. ? Drink an oral rehydration solution (ORS). This drink helps get the right amounts of fluids and salts and minerals in the blood (electrolytes).  Very bad dehydration can be treated: ? With fluids through an IV tube. ? By getting normal levels of salts and minerals in your blood. This is often done by giving salts and minerals through a tube. The tube is passed through your nose and into your stomach. ? By treating the root cause. Follow these instructions at   home: Oral rehydration solution If told by your doctor, drink an ORS:  Make an ORS. Use instructions on the package.  Start by drinking small amounts, about  cup (120 mL) every 5-10 minutes.  Slowly drink more until you have had the amount that your doctor said to have. Eating and drinking  Drink enough clear fluid to keep your pee pale yellow. If you were told to drink an ORS, finish the ORS first. Then, start slowly drinking other clear fluids. Drink fluids such as: ? Water. Do not drink only water. Doing that can make the salt (sodium) level in your body get too low. ? Water  from ice chips you suck on. ? Fruit juice that you have added water to (diluted). ? Low-calorie sports drinks.  Eat foods that have the right amounts of salts and minerals, such as: ? Bananas. ? Oranges. ? Potatoes. ? Tomatoes. ? Spinach.  Do not drink alcohol.  Avoid: ? Drinks that have a lot of sugar. These include:  High-calorie sports drinks.  Fruit juice that you did not add water to.  Soda.  Caffeine. ? Foods that are greasy or have a lot of fat or sugar.         General instructions  Take over-the-counter and prescription medicines only as told by your doctor.  Do not take salt tablets. Doing that can make the salt level in your body get too high.  Return to your normal activities as told by your doctor. Ask your doctor what activities are safe for you.  Keep all follow-up visits as told by your doctor. This is important. Contact a doctor if:  You have pain in your belly (abdomen) and the pain: ? Gets worse. ? Stays in one place.  You have a rash.  You have a stiff neck.  You get angry or annoyed (irritable) more easily than normal.  You are more tired or have a harder time waking than normal.  You feel: ? Weak or dizzy. ? Very thirsty. Get help right away if you have:  Any symptoms of very bad dehydration.  Symptoms of vomiting, such as: ? You cannot eat or drink without vomiting. ? Your vomiting gets worse or does not go away. ? Your vomit has blood or green stuff in it.  Symptoms that get worse with treatment.  A fever.  A very bad headache.  Problems with peeing or pooping (having a bowel movement), such as: ? Watery poop that gets worse or does not go away. ? Blood in your poop (stool). This may cause poop to look black and tarry. ? Not peeing in 6-8 hours. ? Peeing only a small amount of very dark pee in 6-8 hours.  Trouble breathing. These symptoms may be an emergency. Do not wait to see if the symptoms will go away. Get  medical help right away. Call your local emergency services (911 in the [place] Do not drive yourself to the hospital. Summary  Dehydration is a condition in which there is not enough water or other fluids in the body. This happens when a person loses more fluids than he or she takes in.  Treatment for this condition depends on how bad it is. Treatment should be started right away. Do not wait until your condition gets very bad.  Drink enough clear fluid to keep your pee pale yellow. If you were told to drink an oral rehydration solution (ORS), finish the ORS first. Then, start slowly drinking other clear fluids.    Take over-the-counter and prescription medicines only as told by your doctor.  Get help right away if you have any symptoms of very bad dehydration. This information is not intended to replace advice given to you by your health care provider. Make sure you discuss any questions you have with your health care provider. Document Revised: 12/28/2018 Document Reviewed: 12/28/2018 Elsevier Patient Education  2021 [employer]  

## 2020-09-03 NOTE — ED Provider Notes (Signed)
[facility] EMERGENCY DEPARTMENT Provider Note   CSN: [id number] Arrival date & time: 09/03/20  1354     History Chief Complaint  Patient presents with  . Abnormal Lab         [name] [name] is a 50 y.o. male.  He is a poorly controlled diabetic with history of coronary disease COPD.  Continues to smoke.  Has had a mass on his scrotum for a few months.  He is seeing a Psychologist, sport and exercise and is post to have an operation soon.  He has been feeling bad for a couple of months.  Fatigue and low-grade fevers.  Has a chronic cough and sometimes has some nausea and diarrhea.  Vomited once.  Urinates frequently but no dysuria.  He went to his primary care doctor today had some labs done that were abnormal and was referred to the emergency department.  The history is provided by the patient.  Male GU Problem Presenting symptoms: scrotal pain and swelling   Context: spontaneously   Relieved by:  Nothing Worsened by:  Tactile pressure and movement Ineffective treatments:  None tried Associated symptoms: diarrhea, fever, genital itching, nausea, scrotal swelling and vomiting   Associated symptoms: no abdominal pain, no flank pain and no hematuria   Risk factors: no bladder surgery and no urinary catheter        Past Medical History:  Diagnosis Date  . Anxiety   . Arthritis   . CAD (coronary artery disease)    Moderate LAD disease 2016 - Dr. Einar Gip  . Chronic bronchitis ([place])   . Chronic upper back pain   . COPD (chronic obstructive pulmonary disease) ([place])   . Depression   . Diabetic peripheral neuropathy ([place])   . GERD (gastroesophageal reflux disease)   . History of gout   . Hyperlipemia   . Hypertension   . Migraine   . Noncompliance   . 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 Sleep apnea 2016   "could not afford CPAP".  . Type 2 diabetes mellitus [facility])     Patient Active Problem List   Diagnosis Date Noted  . Personal history of noncompliance with medical treatment, presenting hazards  to health 05/27/2020  . Esophagitis, reflux 01/23/2019  . Barrett's esophagus 01/23/2019  . Diarrhea 01/23/2019  . Ulcerative esophagitis 01/23/2019  . MDD (major depressive disorder), severe ([place]) 12/17/2018  . DKA, type 2 ([place]) 11/07/2018  . High anion gap metabolic acidosis 05/19/7587  . Epigastric abdominal pain 03/22/2018  . Nausea vomiting and diarrhea 03/22/2018  . AKI (acute kidney injury) ([place]) 03/22/2018  . Uncontrolled type 2 diabetes mellitus with hyperglycemia ([place]) 03/22/2018  . CAD (coronary artery disease) 02/15/2018  . Mixed hyperlipidemia 02/15/2018  . Current smoker 02/15/2018  . Diabetic gastroparesis ([place]) 06/02/2017  . ARF (acute renal failure) ([place]) 05/30/2017  . DKA (diabetic ketoacidoses) 10/15/2016  . Gastroesophageal reflux disease 10/15/2016  . Anxiety with depression 10/15/2016  . HTN (hypertension) 10/15/2016  . Leukocytosis 11/25/2015  . Intractable nausea and vomiting 11/25/2015  . Dental abscess 11/25/2015  . Nausea and vomiting 11/25/2015  . Chest pain 03/27/2015    Past Surgical History:  Procedure Laterality Date  . ANKLE SURGERY Right 1982   "had extra bones in there; took them out"  . APPENDECTOMY  1975  . BIOPSY  12/26/2018   Procedure: BIOPSY;  Surgeon: Rogene Houston, MD;  [facility];  Service: Endoscopy;;  duodenal biopsies  . BIOPSY  03/23/2019   Procedure:  BIOPSY;  Surgeon: Rogene Houston, MD;  Location[facility];  Service: Endoscopy;;  esophagus  . CARDIAC CATHETERIZATION N/A 03/28/2015   Procedure: Left Heart Cath and Coronary Angiography;  Surgeon: Adrian Prows, MD;  Location: [place] CV LAB;  Service: Cardiovascular;  Laterality: N/A;  . CARDIAC CATHETERIZATION N/A 03/28/2015   Procedure: Intravascular Pressure Wire/FFR Study;  Surgeon: Adrian Prows, MD;  Location: [place] [facility] LAB;  Service: Cardiovascular;  Laterality: N/A;  . CARPAL TUNNEL RELEASE Left ~ 2008  . COLONOSCOPY WITH  ESOPHAGOGASTRODUODENOSCOPY (EGD)    . COLONOSCOPY WITH PROPOFOL N/A 12/24/2019   Procedure: COLONOSCOPY WITH PROPOFOL;  Surgeon: Rogene Houston, MD;  [facility];  Service: Endoscopy;  Laterality: N/A;  955  . ELBOW FRACTURE SURGERY Left ~ 2008  . ESOPHAGOGASTRODUODENOSCOPY N/A 12/26/2018   Procedure: ESOPHAGOGASTRODUODENOSCOPY (EGD);  Surgeon: Rogene Houston, MD;  [facility];  Service: Endoscopy;  Laterality: N/A;  . ESOPHAGOGASTRODUODENOSCOPY (EGD) WITH PROPOFOL N/A 03/23/2019   Procedure: ESOPHAGOGASTRODUODENOSCOPY (EGD) WITH PROPOFOL;  Surgeon: Rogene Houston, MD;  [facility];  Service: Endoscopy;  Laterality: N/A;  7:30  . FRACTURE SURGERY     ankle and elbow  . PILONIDAL CYST EXCISION N/A 03/24/2017   Procedure: EXCISION CHRONIC  PILONIDAL ABSCESS;  Surgeon: Coralie Keens, MD;  Location: [facility];  Service: General;  Laterality: N/A;  . TENDON REPAIR Left ~ 2004   "main tendon in my ankle"       Family History  Problem Relation Age of Onset  . Congestive Heart Failure Sister   . Diabetes Mother   . Hypertension Mother   . Cancer Mother   . Diabetes Father   . Hypertension Father   . Hyperlipidemia Father     Social History   Tobacco Use  . Smoking status: Current Every Day Smoker    Packs/day: 0.50    Years: 34.00    Pack years: 17.00    Types: Cigarettes  . Smokeless tobacco: Never Used  . Tobacco comment: 1/2 pack a day  Vaping Use  . Vaping Use: Never used  Substance Use Topics  . Alcohol use: Never    Alcohol/week: 0.0 standard drinks  . Drug use: Never    Home Medications Prior to Admission medications   Medication Sig Start Date End Date Taking? Authorizing Provider  acetaminophen (TYLENOL) 325 MG tablet Take 2 tablets (650 mg total) by mouth every 6 (six) hours as needed for moderate pain. Patient taking differently: Take 650-975 mg by mouth every 6 (six) hours as needed for moderate pain. 03/25/18    Elgergawy, Silver Huguenin, MD  albuterol (PROVENTIL HFA) 108 (90 Base) MCG/ACT inhaler INHALE 2 PUFFS BY MOUTH EVERY 6 HOURS AS NEEDED FOR COUGHING, WHEEZING, OR SHORTNESS OF BREATH Patient taking differently: Inhale 2 puffs into the lungs every 6 (six) hours as needed for wheezing or shortness of breath. 07/09/20   Soyla Dryer, PA-C  ARIPiprazole (ABILIFY) 5 MG tablet Take 5 mg by mouth daily.    [provider]  aspirin 81 MG tablet Take 1 tablet (81 mg total) by mouth at bedtime. 03/24/19   Rogene Houston, MD  atorvastatin (LIPITOR) 80 MG tablet TAKE 1 Tablet BY MOUTH ONCE EVERY DAY 07/09/20   Soyla Dryer, PA-C  Carboxymethylcellul-Glycerin (LUBRICATING EYE DROPS OP) Place 1 drop into both eyes daily as needed (itching eyes).    [provider]  DULERA 100-5 MCG/ACT AERO INHALE 2 PUFFS BY MOUTH TWICE  DAILY. RINSE MOUTH AFTER EACH USE Patient taking differently: Inhale 2 puffs into the lungs 2 (two) times daily. 10/10/19   Soyla Dryer, PA-C  erythromycin ophthalmic ointment Place 1 application into the right eye 4 (four) times daily. For 5 - 7 days 08/26/20   Soyla Dryer, PA-C  ibuprofen (ADVIL) 200 MG tablet Take 400-600 mg by mouth every 6 (six) hours as needed for moderate pain.    [provider]  insulin aspart (NOVOLOG) 100 UNIT/ML injection Inject 14-20 Units into the skin 3 (three) times daily before meals. 05/08/20   Cassandria Anger, MD  insulin glargine (LANTUS SOLOSTAR) 100 UNIT/ML Solostar Pen Inject 60 Units into the skin at bedtime. Patient taking differently: Inject 65 Units into the skin at bedtime. 05/08/20   Cassandria Anger, MD  isosorbide mononitrate (IMDUR) 30 MG 24 hr tablet Take 1 tablet (30 mg total) by mouth daily. 08/17/19 12/17/19  Satira Sark, MD  metFORMIN (GLUCOPHAGE) 500 MG tablet Take 1 tablet (500 mg total) by mouth 2 (two) times daily with a meal. 01/17/20   Nida, Marella Chimes, MD  metoCLOPramide (REGLAN) 5 MG  tablet Take 1 tablet (5 mg total) by mouth 4 (four) times daily -  before meals and at bedtime. 11/22/19   Ezzard Standing, PA-C  nitroGLYCERIN (NITROSTAT) 0.4 MG SL tablet Place 1 tablet (0.4 mg total) under the tongue every 5 (five) minutes as needed for chest pain. 10/05/17   Soyla Dryer, PA-C  pantoprazole (PROTONIX) 40 MG tablet Take 1 tablet (40 mg total) by mouth daily. 07/30/20   Soyla Dryer, PA-C  promethazine (PHENERGAN) 25 MG suppository Place 1 suppository (25 mg total) rectally every 8 (eight) hours as needed for refractory nausea / vomiting. 12/20/18   Barton Dubois, MD  promethazine (PHENERGAN) 25 MG tablet Take 25 mg by mouth every 8 (eight) hours as needed for nausea or vomiting.    [provider]  sertraline (ZOLOFT) 100 MG tablet Take 1 tablet by mouth daily. 01/09/20   [provider]    Allergies    Omeprazole magnesium, Bee venom, and Esomeprazole  Review of Systems   Review of Systems  Constitutional: Positive for fatigue and fever.  HENT: Negative for sore throat.   Eyes: Negative for visual disturbance.  Respiratory: Positive for cough.   Cardiovascular: Negative for chest pain.  Gastrointestinal: Positive for diarrhea, nausea and vomiting. Negative for abdominal pain.  Genitourinary: Positive for scrotal swelling. Negative for flank pain and hematuria.  Musculoskeletal: Positive for myalgias.  Skin: Positive for rash.  Neurological: Positive for headaches.    Physical Exam Updated Vital Signs BP 93/77 (BP Location: Right Arm)   Pulse (!) 108   Temp 98.3 F (36.8 C) (Oral)   Resp 17   Ht 5' 8" (1.727 m)   Wt 73 kg   SpO2 96%   BMI 24.48 kg/m   Physical Exam Vitals and nursing note reviewed.  Constitutional:      Appearance: Normal appearance. He is well-developed. He is ill-appearing.  HENT:     Head: Normocephalic and atraumatic.  Eyes:     Conjunctiva/sclera: Conjunctivae normal.  Cardiovascular:     Rate and Rhythm:  Regular rhythm. Tachycardia present.     Heart sounds: No murmur heard.   Pulmonary:     Effort: Pulmonary effort is normal. No respiratory distress.     Breath sounds: Normal breath sounds.  Abdominal:     Palpations: Abdomen is soft.  Tenderness: There is no abdominal tenderness. There is no guarding or rebound.  Genitourinary:    Comments: He has a scrotal mass that is tender and fluctuant.  Extends onto the perineum.  It originates in the left side of the scrotum and perineum and does not appear to involve the anus. Musculoskeletal:        General: No deformity or signs of injury. Normal range of motion.     Cervical back: Neck supple.  Skin:    General: Skin is warm and dry.     Capillary Refill: Capillary refill takes less than 2 seconds.  Neurological:     General: No focal deficit present.     Mental Status: He is alert.     ED Results / Procedures / Treatments   Labs (all labs ordered are listed, but only abnormal results are displayed) Labs Reviewed  CBC WITH DIFFERENTIAL/PLATELET - Abnormal; Notable for the following components:      Result Value   WBC 18.3 (*)    Neutro Abs 15.3 (*)    All other components within normal limits  COMPREHENSIVE METABOLIC PANEL - Abnormal; Notable for the following components:   Sodium 128 (*)    Potassium 5.6 (*)    Chloride 92 (*)    Glucose, Bld 568 (*)    Creatinine, Ser 1.74 (*)    Calcium 8.6 (*)    Albumin 3.3 (*)    AST 14 (*)    GFR, Estimated 47 (*)    All other components within normal limits  LACTIC ACID, PLASMA - Abnormal; Notable for the following components:   Lactic Acid, Venous 2.0 (*)    All other components within normal limits  LACTIC ACID, PLASMA - Abnormal; Notable for the following components:   Lactic Acid, Venous 2.4 (*)    All other components within normal limits  LACTIC ACID, PLASMA - Abnormal; Notable for the following components:   Lactic Acid, Venous 2.0 (*)    All other components within  normal limits  BASIC METABOLIC PANEL - Abnormal; Notable for the following components:   Glucose, Bld 164 (*)    Calcium 8.4 (*)    All other components within normal limits  CBC - Abnormal; Notable for the following components:   WBC 11.7 (*)    All other components within normal limits  GLUCOSE, CAPILLARY - Abnormal; Notable for the following components:   Glucose-Capillary 149 (*)    All other components within normal limits  GLUCOSE, CAPILLARY - Abnormal; Notable for the following components:   Glucose-Capillary 126 (*)    All other components within normal limits  CBG MONITORING, ED - Abnormal; Notable for the following components:   Glucose-Capillary 297 (*)    All other components within normal limits  CBG MONITORING, ED - Abnormal; Notable for the following components:   Glucose-Capillary 202 (*)    All other components within normal limits  RESP PANEL BY RT-PCR (FLU A&B, COVID) ARPGX2  URINE CULTURE  CULTURE, BLOOD (ROUTINE X 2)  CULTURE, BLOOD (ROUTINE X 2)  BODY FLUID CULTURE W GRAM STAIN  PROTIME-INR  APTT  HIV ANTIBODY (ROUTINE TESTING W REFLEX)  HEMOGLOBIN A1C    EKG EKG Interpretation  Date/Time:  Wednesday September 03 2020 15:33:33 EDT Ventricular Rate:  99 PR Interval:  128 QRS Duration: 137 QT Interval:  379 QTC Calculation: 487 R Axis:   -70 Text Interpretation: Sinus rhythm Probable left atrial enlargement RBBB and LAFB ST elev, probable normal early repol  pattern Baseline wander in lead(s) V3 No significant change since prior 7/21 Confirmed by Aletta Edouard [phone]) on 09/03/2020 3:54:10 PM   Radiology DG Chest Port 1 View  Result Date: 09/03/2020 CLINICAL DATA:  Dehydration EXAM: PORTABLE CHEST 1 VIEW COMPARISON:  December 23, 2018 FINDINGS: No edema or airspace opacity. Heart size and pulmonary vascularity are normal. No adenopathy. No bone lesions. IMPRESSION: No edema or airspace opacity. Cardiac silhouette within normal limits. Electronically Signed    By: Lowella Grip III M.D.   On: 09/03/2020 15:52    Procedures Procedures   Medications Ordered in ED Medications  insulin aspart (novoLOG) injection 0-15 Units (2 Units Subcutaneous Given 09/04/20 0850)  insulin aspart (novoLOG) injection 0-5 Units (2 Units Subcutaneous Given 09/03/20 2356)  lactated ringers infusion ( Intravenous Rate/Dose Change 09/03/20 2357)  metoCLOPramide (REGLAN) tablet 5 mg (5 mg Oral Given 09/04/20 0830)  sertraline (ZOLOFT) tablet 100 mg (100 mg Oral Given 09/04/20 0830)  atorvastatin (LIPITOR) tablet 80 mg (80 mg Oral Given 09/04/20 0829)  aspirin EC tablet 81 mg (81 mg Oral Given 09/03/20 2357)  enoxaparin (LOVENOX) injection 40 mg (40 mg Subcutaneous Given 09/04/20 0830)  acetaminophen (TYLENOL) tablet 650 mg (has no administration in time range)    Or  acetaminophen (TYLENOL) suppository 650 mg (has no administration in time range)  piperacillin-tazobactam (ZOSYN) IVPB 3.375 g (3.375 g Intravenous New Bag/Given 09/04/20 0831)  insulin glargine (LANTUS) injection 50 Units (50 Units Subcutaneous Given 09/03/20 2356)  lactated ringers bolus 1,000 mL (0 mLs Intravenous Stopped 09/03/20 1858)  insulin aspart (novoLOG) injection 10 Units (10 Units Subcutaneous Given 09/03/20 1638)  lactated ringers bolus 1,200 mL (0 mLs Intravenous Stopped 09/03/20 1739)  lidocaine-EPINEPHrine (XYLOCAINE W/EPI) 2 %-1:200000 (PF) injection 20 mL (20 mLs Infiltration Given 09/03/20 1711)  piperacillin-tazobactam (ZOSYN) IVPB 3.375 g (0 g Intravenous Stopped 09/03/20 1712)  morphine 4 MG/ML injection 4 mg (4 mg Intravenous Given 09/03/20 1716)  sodium chloride 0.9 % bolus 500 mL (0 mLs Intravenous Stopped 09/04/20 0135)    ED Course  I have reviewed the triage vital signs and the nursing notes.  Pertinent labs & imaging results that were available during my care of the patient were reviewed by me and considered in my medical decision making (see chart for details).  Clinical Course as of 09/04/20 1156   Wed Sep 03, 2020  1558 Discussed with Dr. Alyson Ingles from urology who asked that an I&D kit and lidocaine be brought to bedside.  He recommended Zosyn for antibiotics. [MB]  51 Dr. Alyson Ingles here at bedside doing an I&D.  He is packing the abscess cavity. [MB]  8101 Discussed with [facility] hospitalist Dr. Denton Brick who will evaluate the patient for admission. [MB]  1816 Sepsis - Repeat Assessment  Performed at:    1800  Vitals     _0 @  Heart:     Regular rate and rhythm  Lungs:    CTA  Capillary Refill:   <2 sec  Peripheral Pulse:   Radial pulse palpable  Skin:     Normal Color     [MB]    Clinical Course User Index [MB] Hayden Rasmussen, MD   MDM Rules/Calculators/A&P                         Guy Franco Grabill was evaluated in Emergency Department on 09/03/2020 for the symptoms described in the history of present illness. He was evaluated in the context of the global  COVID-19 pandemic, which necessitated consideration that the patient might be at risk for infection with the SARS-CoV-2 virus that causes COVID-19. Institutional protocols and algorithms that pertain to the evaluation of patients at risk for COVID-19 are in a state of rapid change based on information released by regulatory bodies including the CDC and federal and state organizations. These policies and algorithms were followed during the patient's care in the ED.  This patient complains of malaise, abnormal labs, scrotal pain and swelling; this involves an extensive number of treatment Options and is a complaint that carries with it a high risk of complications and Morbidity. The differential includes sepsis, Sirs, Fournier's gangrene, scrotal abscess, perineal abscess, perirectal abscess, dehydration  I ordered, reviewed and interpreted labs, which included labs showing low sodium consistent with pseudohyponatremia, elevated glucose, elevated potassium, elevated creatinine, elevated white count, elevated  lactate I ordered medication IV fluids IV antibiotics, subcu insulin I ordered imaging studies which included chest x-ray and I independently    visualized and interpreted imaging which showed no acute findings  Previous records obtained and reviewed in epic, prior had seen Dr. Arnoldo Morale from general surgery and was referred off to Dr. Alyson Ingles from urology for scrotal issue I consulted Dr. Alyson Ingles urology and [facility] hospitalist Dr. Denton Brick and discussed lab and imaging findings  Critical Interventions: Aggressive intervention of sepsis with IV fluids and antibiotics and surgical control of infection  After the interventions stated above, I reevaluated the patient and found patient to be improved hemodynamics.  He will need to be admitted to the hospital for continued management.   Final Clinical Impression(s) / ED Diagnoses Final diagnoses:  None    Rx / DC Orders ED Discharge Orders    None       Hayden Rasmussen, MD 09/04/20 1159

## 2020-09-03 NOTE — Clinical Social Work Note (Signed)
CSW observed patient's insurance status. CSW contacted patient to inquire about insurance status. Patient reported that his stay was covered by "[employer] Patient reported registration is working to identify patient's coverage. TOC to follow.

## 2020-09-03 NOTE — Progress Notes (Signed)
BP 113/67   Pulse (!) 114   Temp 97.7 F (36.5 C)   Wt 163 lb (73.9 kg)   BMI 24.78 kg/m    Subjective:    Patient ID: [name] [name], male    DOB: [birth year], 50 y.o.   MRN: [id number]  HPI: [name] [name] is a 50 y.o. male presenting on 09/03/2020 for No chief complaint on file.   HPI   Pt had a negative covid 19 screening questionnaire.    Pt is worked in today due to being called by MH where his bp was noted to be low.   Pt says he is Throwing up.  When asked how long, he says he has always thrown up.  He was last seen by GI in June 2021.  He has appointment to follow up with GI in May.     Pt seen in theis office 3/29, 3/15, 3/2, 2/21 and never mentioned emesis.  He says that yes, He feels like his throwing up has gotten worse over the past week.   He thinks he is drinking probably more water or tea than usual.  He says he doesn't drink alcohol.  fbs 325 this morning.   He says he feels drained but he always feels like this.  Again with further questioning, he agrees that he fees more drained over the past week that usual.    Pt is difficult historian.   He says he is even throwing up his liquids.  He can keep down sips.  No nausea, just throws up.      Relevant past medical, surgical, family and social history reviewed and updated as indicated. Interim medical history since our last visit reviewed. Allergies and medications reviewed and updated.    Current Outpatient Medications:  .  acetaminophen (TYLENOL) 325 MG tablet, Take 2 tablets (650 mg total) by mouth every 6 (six) hours as needed for moderate pain. (Patient taking differently: Take 650-975 mg by mouth every 6 (six) hours as needed for moderate pain.), Disp: , Rfl:  .  albuterol (PROVENTIL HFA) 108 (90 Base) MCG/ACT inhaler, INHALE 2 PUFFS BY MOUTH EVERY 6 HOURS AS NEEDED FOR COUGHING, WHEEZING, OR SHORTNESS OF BREATH, Disp: 3 each, Rfl: 0 .  ARIPiprazole (ABILIFY) 5 MG tablet, Take 5 mg by mouth  daily., Disp: , Rfl:  .  aspirin 81 MG tablet, Take 1 tablet (81 mg total) by mouth at bedtime., Disp: 30 tablet, Rfl:  .  atorvastatin (LIPITOR) 80 MG tablet, TAKE 1 Tablet BY MOUTH ONCE EVERY DAY, Disp: 90 tablet, Rfl: 0 .  Carboxymethylcellul-Glycerin (LUBRICATING EYE DROPS OP), Place 1 drop into both eyes daily as needed (itching eyes)., Disp: , Rfl:  .  DULERA 100-5 MCG/ACT AERO, INHALE 2 PUFFS BY MOUTH TWICE DAILY. RINSE MOUTH AFTER EACH USE (Patient taking differently: Inhale 2 puffs into the lungs 2 (two) times daily.), Disp: 13 g, Rfl: 0 .  erythromycin ophthalmic ointment, Place 1 application into the right eye 4 (four) times daily. For 5 - 7 days, Disp: 3.5 g, Rfl: 0 .  ibuprofen (ADVIL) 200 MG tablet, Take 400-600 mg by mouth every 6 (six) hours as needed for moderate pain., Disp: , Rfl:  .  insulin aspart (NOVOLOG) 100 UNIT/ML injection, Inject 14-20 Units into the skin 3 (three) times daily before meals., Disp: 15 mL, Rfl: 2 .  insulin glargine (LANTUS SOLOSTAR) 100 UNIT/ML Solostar Pen, Inject 60 Units into the skin at bedtime. (Patient taking differently: Inject 65  Units into the skin at bedtime.), Disp: 15 mL, Rfl:  .  isosorbide mononitrate (IMDUR) 30 MG 24 hr tablet, Take 1 tablet (30 mg total) by mouth daily., Disp: 90 tablet, Rfl: 3 .  metFORMIN (GLUCOPHAGE) 500 MG tablet, Take 1 tablet (500 mg total) by mouth 2 (two) times daily with a meal., Disp: 60 tablet, Rfl: 1 .  metoCLOPramide (REGLAN) 5 MG tablet, Take 1 tablet (5 mg total) by mouth 4 (four) times daily -  before meals and at bedtime., Disp: 120 tablet, Rfl: 1 .  nitroGLYCERIN (NITROSTAT) 0.4 MG SL tablet, Place 1 tablet (0.4 mg total) under the tongue every 5 (five) minutes as needed for chest pain., Disp: 25 tablet, Rfl: 3 .  pantoprazole (PROTONIX) 40 MG tablet, Take 1 tablet (40 mg total) by mouth daily., Disp: 30 tablet, Rfl: 11 .  promethazine (PHENERGAN) 25 MG suppository, Place 1 suppository (25 mg total) rectally  every 8 (eight) hours as needed for refractory nausea / vomiting., Disp: 12 each, Rfl: 0 .  promethazine (PHENERGAN) 25 MG tablet, Take 25 mg by mouth every 8 (eight) hours as needed for nausea or vomiting., Disp: , Rfl:  .  sertraline (ZOLOFT) 100 MG tablet, Take 1 tablet by mouth daily., Disp: , Rfl:      Review of Systems  Per HPI unless specifically indicated above     Objective:    BP 113/67   Pulse (!) 114   Temp 97.7 F (36.5 C)   Wt 163 lb (73.9 kg)   BMI 24.78 kg/m   Wt Readings from Last 3 Encounters:  09/03/20 163 lb (73.9 kg)  08/26/20 163 lb 8 oz (74.2 kg)  08/14/20 165 lb (74.8 kg)     Supine 97/64  107 Seated  94/62   109 Standing    79/55  116   Orthostatic VS for the past 24 hrs:  BP- Lying Pulse- Lying BP- Sitting Pulse- Sitting BP- Standing at 0 minutes Pulse- Standing at 0 minutes  09/03/20 1223 97/64 107 94/62 109 (!) 79/55 116       Physical Exam Constitutional:      General: He is not in acute distress.    Appearance: He is not toxic-appearing.     Comments: He looks more fatigued than his usual  HENT:     Head: Normocephalic and atraumatic.  Cardiovascular:     Rate and Rhythm: Regular rhythm. Tachycardia present.  Pulmonary:     Effort: Pulmonary effort is normal. No respiratory distress.     Breath sounds: No wheezing or rhonchi.  Abdominal:     General: Abdomen is flat. Bowel sounds are normal.     Palpations: Abdomen is soft.     Tenderness: There is no abdominal tenderness.  Musculoskeletal:     Cervical back: Neck supple.     Right lower leg: No edema.     Left lower leg: No edema.  Lymphadenopathy:     Cervical: No cervical adenopathy.  Neurological:     Mental Status: He is alert and oriented to person, place, and time.  Psychiatric:        Attention and Perception: Attention normal.        Speech: Speech normal.        Behavior: Behavior is cooperative.      Urinalysis    Component Value Date/Time   COLORURINE  YELLOW 12/23/2018 2330   APPEARANCEUR HAZY (A) 12/23/2018 2330   LABSPEC 1.015 12/23/2018 2330   PHURINE  5.0 12/23/2018 2330   GLUCOSEU NEGATIVE 12/23/2018 2330   HGBUR NEGATIVE 12/23/2018 2330   BILIRUBINUR negative 09/03/2020 1148   KETONESUR 20 (A) 12/23/2018 2330   PROTEINUR Negative 09/03/2020 1148   PROTEINUR 30 (A) 12/23/2018 2330   UROBILINOGEN negative (A) 09/03/2020 1148   NITRITE negative 09/03/2020 1148   NITRITE NEGATIVE 12/23/2018 2330   LEUKOCYTESUR Negative 09/03/2020 1148   LEUKOCYTESUR NEGATIVE 12/23/2018 2330           Assessment & Plan:    Encounter Diagnoses  Name Primary?  . Non-intractable vomiting without nausea, unspecified vomiting type Yes  . Fatigue, unspecified type   . Uncontrolled type 2 diabetes mellitus with hyperglycemia (HCC)   . Tachycardia   . Barrett's esophagus without dysplasia   . Dehydration   . Reading difficulty       -Pt to call to Reschedule with endocrinology for uncontrolled DM  -Check labs when leaves today- cbc, bmp -We will call GI to see if he can be seen sooner if there is a cancellation.  Suspect his emesis is related to his Barretts.  Less likely diabetic gastroparesis in light of absence of nausea prior to emesis.  Pt is counseled to try to hydrate with small sips & ice chips.  He is encouraged to use caution with bread and meats.   He is given reading information on dehydration and is encouraged to get his mother to go over it with him.   Discussed monitoring his pulse as a sign of dehydration.  Discussed reasons to go to hospital including onset dizziness, unable to keep down any fluids, worsening tachycardia.  He states understanding

## 2020-09-03 NOTE — Sepsis Progress Note (Signed)
Code sepsis protocol being monitored by eLink. 

## 2020-09-03 NOTE — Progress Notes (Addendum)
Pharmacy Antibiotic Note  [name] [name] is a 50 y.o. male with diabetes admitted on 09/03/2020 with scrotal abscess, sepsis and AKI.  Pharmacy has been consulted for Zosyn dosing.  WBC 18.3, afebrile; Scr 1.74 (up from 0.97 in early March 2022), CrCl 47.8 ml/min  Plan: Zosyn 3.375 gm IV Q 8 hrs (extended infusion) Monitor WBC, temp, clinical improvement, cultures, renal function  Height: 5\' 8"  (172.7 cm) Weight: 73 kg (161 lb) IBW/kg (Calculated) : 68.4  Temp (24hrs), Avg:98 F (36.7 C), Min:97.7 F (36.5 C), Max:98.3 F (36.8 C)  Recent Labs  Lab 09/03/20 1225 09/03/20 1443 09/03/20 1735  WBC 21.6* 18.3*  --   CREATININE 1.74* 1.74*  --   LATICACIDVEN  --  2.0* 2.4*    Estimated Creatinine Clearance: 48.6 mL/min (A) (by C-G formula based on SCr of 1.74 mg/dL (H)).    Allergies  Allergen Reactions  . Omeprazole Magnesium Swelling    Face swells, no breathing impairment  . Bee Venom   . Esomeprazole Swelling    Face swells, no breathing impairment  . Morphine And Related     Itching at IV site, dry heaving, burning sensation in throat.    Antimicrobials this admission: 4/6 Zosyn >>  Microbiology results: 4/6 BCx X 2: pending 4/6 COVID, flu A, flu B:  negative  Thank you for allowing pharmacy to be a part of this patient's care.  6/6, PharmD, BCPS, [facility] Clinical Pharmacist 09/03/2020 7:08 PM

## 2020-09-03 NOTE — ED Triage Notes (Signed)
Pt to er, pt states that [facility], states that he wbc and kidney function was elevated and they sent him over to get some fluids for possible dehydration.  Pt reports that he has a mass in his groin that has been draining and he scheduled to see a surgeon on Friday, states that he also has some fatigue.

## 2020-09-04 ENCOUNTER — Inpatient Hospital Stay (HOSPITAL_COMMUNITY): Payer: Self-pay

## 2020-09-04 LAB — CBC
HCT: 39.7 % (ref 39.0–52.0)
Hemoglobin: 13.2 g/dL (ref 13.0–17.0)
MCH: 31.2 pg (ref 26.0–34.0)
MCHC: 33.2 g/dL (ref 30.0–36.0)
MCV: 93.9 fL (ref 80.0–100.0)
Platelets: 258 10*3/uL (ref 150–400)
RBC: 4.23 MIL/uL (ref 4.22–5.81)
RDW: 12.2 % (ref 11.5–15.5)
WBC: 11.7 10*3/uL — ABNORMAL HIGH (ref 4.0–10.5)
nRBC: 0 % (ref 0.0–0.2)

## 2020-09-04 LAB — HIV ANTIBODY (ROUTINE TESTING W REFLEX): HIV Screen 4th Generation wRfx: NONREACTIVE

## 2020-09-04 LAB — BASIC METABOLIC PANEL
Anion gap: 9 (ref 5–15)
BUN: 13 mg/dL (ref 6–20)
CO2: 28 mmol/L (ref 22–32)
Calcium: 8.4 mg/dL — ABNORMAL LOW (ref 8.9–10.3)
Chloride: 101 mmol/L (ref 98–111)
Creatinine, Ser: 1.15 mg/dL (ref 0.61–1.24)
GFR, Estimated: >60 mL/min (ref >60)
Glucose, Bld: 164 mg/dL — ABNORMAL HIGH (ref 70–99)
Potassium: 3.7 mmol/L (ref 3.5–5.1)
Sodium: 138 mmol/L (ref 135–145)

## 2020-09-04 LAB — GLUCOSE, CAPILLARY
Glucose-Capillary: 149 mg/dL — ABNORMAL HIGH (ref 70–99)
Glucose-Capillary: 126 mg/dL — ABNORMAL HIGH (ref 70–99)
Glucose-Capillary: 90 mg/dL (ref 70–99)

## 2020-09-04 NOTE — Progress Notes (Addendum)
Patient Demographics:    [name] [name], is a 50 y.o. male, DOB - 08/31/1970, ZDG:[id number]  Admit date - 09/03/2020   Admitting Physician Ejiroghene Wendall Stade, MD  Outpatient Primary MD for the patient is [name], PA-C  LOS - 1  Chief Complaint  Patient presents with  . Abnormal Lab             Subjective:    [name] [name] today has no fevers, no emesis,  No chest pain,   -Scrotal pain is better   Assessment  & Plan :    Principal Problem:   Severe sepsis (HCC) Active Problems:   MDD (major depressive disorder), severe (HCC)   Uncontrolled type 2 diabetes mellitus with hyperglycemia (HCC)   ARF (acute renal failure) (HCC)   Diabetic gastroparesis (HCC)   CAD (coronary artery disease)   Current smoker   Brief Summary:- 50 y.o. male with medical history significant for diabetes mellitus with gastroparesis, coronary artery disease, hypertension, COPD, depression admitted on 09/03/20 with Sepsis from Scrotal abscess/cellulitis--  A/p 1)Severe Sepsis due to scrotal abscess/cellulitis--status post I&D by urologist Dr. Ronne Binning on 09/03/2020 -Continue IV Zosyn pending culture data  continue  packing scrotal wound daily with  iodiform gauze  -WBC is down to 11.7 from 21.6 -Patient afebrile and tachypnea tachycardia is resolving  2)AKI--- creatinine is down to 1.1 from 1.74 on admission with hydration - renally adjust medications, avoid nephrotoxic agents / dehydration  / hypotension  3)DM2-with history of diabetic gastroparesis -A1c 4 months ago was 14.6 reflecting uncontrolled DM with hyperglycemia PTA -continue to hold Metformin, glycemic control improving, continue Lantus insulin and Use Novolog/Humalog Sliding scale insulin with Accu-Cheks/Fingersticks as ordered  4)Elevated BP--BP is improved, no routine BP meds   5)HLD-continue atorvastatin 80 mg daily  6)Depression and  Anxiety--- stable, continue Zoloft 100 mg daily  Disposition/Need for in-Hospital Stay- patient unable to be discharged at this time due to --sepsis pathophysiology in the setting of scrotal abscess and cellulitis requiring IV antibiotics*  Status is: Inpatient  Remains inpatient appropriate because:Please see above   Disposition: The patient is from: Home              Anticipated d/c is to: Home              Anticipated d/c date is: 1 day              Patient currently is not medically stable to d/c. Barriers: Not Clinically Stable-   Code Status :  -  Code Status: Full Code   Family Communication:    NA (patient is alert, awake and coherent)   Consults  :  urology  DVT Prophylaxis  :   - SCDs  enoxaparin (LOVENOX) injection 40 mg Start: 09/04/20 0800    Lab Results  Component Value Date   PLT 258 09/04/2020    Inpatient Medications  Scheduled Meds: . aspirin EC  81 mg Oral QHS  . atorvastatin  80 mg Oral Daily  . enoxaparin (LOVENOX) injection  40 mg Subcutaneous Q24H  . insulin aspart  0-15 Units Subcutaneous TID WC  . insulin aspart  0-5 Units Subcutaneous QHS  . insulin glargine  50 Units Subcutaneous QHS  . metoCLOPramide  5  mg Oral TID AC & HS  . sertraline  100 mg Oral Daily   Continuous Infusions: . lactated ringers 100 mL/hr at 09/03/20 2357  . piperacillin-tazobactam (ZOSYN)  IV 3.375 g (09/04/20 0831)   PRN Meds:.acetaminophen **OR** acetaminophen    Anti-infectives (From admission, onward)   Start     Dose/Rate Route Frequency Ordered Stop   09/04/20 0000  piperacillin-tazobactam (ZOSYN) IVPB 3.375 g        3.375 g 12.5 mL/hr over 240 Minutes Intravenous Every 8 hours 09/03/20 1916     09/03/20 1630  piperacillin-tazobactam (ZOSYN) IVPB 3.375 g        3.375 g 100 mL/hr over 30 Minutes Intravenous  Once 09/03/20 1623 09/03/20 1712   09/03/20 1615  piperacillin-tazobactam (ZOSYN) IVPB 3.375 g  Status:  Discontinued        3.375 g 100 mL/hr over  30 Minutes Intravenous  Once 09/03/20 1601 09/03/20 1623        Objective:   Vitals:   09/04/20 0230 09/04/20 0400 09/04/20 0532 09/04/20 0542  BP:  (!) 150/92 (!) 149/102   Pulse: 83 88 88   Resp: (!) 21 19 20    Temp:   98.2 F (36.8 C)   TempSrc:      SpO2: 95% 98% 98%   Weight:    77.2 kg  Height:    5\' 8"  (1.727 m)    Wt Readings from Last 3 Encounters:  09/04/20 77.2 kg  09/03/20 73.9 kg  08/26/20 74.2 kg     Intake/Output Summary (Last 24 hours) at 09/04/2020 1024 Last data filed at 09/04/2020 0700 Gross per 24 hour  Intake 3689.23 ml  Output --  Net 3689.23 ml     Physical Exam  Gen:- Awake Alert,  In no apparent distress  HEENT:- [place].AT, No sclera icterus Neck-Supple Neck,No JVD,.  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  The SARS-CoV-2 RNA is generally detectable in upper respiratory specimens during the acute phase of infection. The lowest concentration of SARS-CoV-2 viral copies this assay can detect is 138 copies/mL. A negative result does not preclude SARS-Cov-2 infection and should not be used as the sole basis for treatment or other patient management decisions.  A negative result may occur with  improper specimen collection/handling, submission of specimen other than nasopharyngeal swab, presence of viral mutation(s) within the areas targeted by this assay, and inadequate number of viral copies(<138 copies/mL). A negative result must be combined with clinical observations, patient history, and epidemiological information. The expected result is Negative.  Fact Sheet for Patients:  BloggerCourse.comhttps://www.fda.gov/media/152166/download  Fact Sheet for Healthcare Providers:  SeriousBroker.ithttps://www.fda.gov/media/152162/download  This test is no t yet approved or cleared by the  [place] [employer] and  has been authorized for detection and/or diagnosis of SARS-CoV-2 by FDA under an TEFL teacher (EUA). This EUA will remain  in effect (meaning this test can be used) for the duration of the COVID-19 declaration under Section 564(b)(1) of the Act, 21 U.S.C.section 360bbb-3(b)(1), unless the authorization is terminated  or revoked sooner.       Influenza A by PCR NEGATIVE NEGATIVE Final   Influenza B by PCR NEGATIVE NEGATIVE Final    Comment: (NOTE) The Xpert Xpress SARS-CoV-2/FLU/RSV plus assay is intended as an aid in the diagnosis of influenza from Nasopharyngeal swab specimens and should not be used as a sole basis for treatment. Nasal washings and aspirates are unacceptable for Xpert Xpress SARS-CoV-2/FLU/RSV testing.  Fact Sheet for Patients: BloggerCourse.com  Fact Sheet for Healthcare Providers: SeriousBroker.it  This test is not yet approved or cleared by the [place] FDA and has been authorized for detection and/or diagnosis of SARS-CoV-2 by FDA under an Emergency Use Authorization (EUA). This EUA will remain in effect (meaning this test can be used) for the duration of the COVID-19 declaration under Section 564(b)(1) of the Act, 21 U.S.C. section 360bbb-3(b)(1), unless the authorization  is terminated or revoked.  Performed at [facility], [address] [place], [place] [address]     Radiology Reports US Scrotum  Result Date: 08/12/2020 CLINICAL DATA:  Testicular mass, abscess posterior scrotum EXAM: ULTRASOUND OF SCROTUM TECHNIQUE: Complete ultrasound examination of the testicles, epididymis, and other scrotal structures was performed. COMPARISON:  CT 11/07/2018 FINDINGS: Right testicle Measurements: 4.2 x 1.9 x 3.2 cm. No mass or microlithiasis visualized. Left testicle Measurements: 3.4 x 1.8 x 3.4 cm. No mass. Scattered microcalcifications. Right epididymis:  4 mm epididymal head cyst.  Otherwise normal. Left epididymis:  Normal in size and appearance. Hydrocele:  Small right hydrocele Varicocele:  None visualized. Other: Complex fluid collection seen within the posterolateral left scrotum. Area measures approximately 5.8 x 3.8 x 2.5 cm. IMPRESSION: No testicular abnormality or evidence of torsion. Complex fluid collection within the left posterior and lateral scrotal wall measuring up to 5.8 cm which may reflect abscess. Electronically Signed   By: Charlett Nose M.D.   On: 08/12/2020 11:08   DG Chest Port 1 View  Result Date: 09/03/2020 CLINICAL DATA:  Dehydration EXAM: PORTABLE CHEST 1 VIEW COMPARISON:  December 23, 2018 FINDINGS: No edema or airspace opacity. Heart size and pulmonary vascularity are normal. No adenopathy. No bone lesions. IMPRESSION: No edema or airspace opacity. Cardiac silhouette within normal limits. Electronically Signed   By: Bretta Bang III M.D.   On: 09/03/2020 15:52     CBC Recent Labs  Lab 09/03/20 1225 09/03/20 1443 09/04/20 0353  WBC 21.6* 18.3* 11.7*  HGB 15.0 14.8 13.2  HCT 44.3 43.6 39.7  PLT 309 287 258  MCV 92.7 92.2 93.9  MCH 31.4 31.3 31.2  MCHC 33.9 33.9 33.2  RDW 12.1 12.2 12.2  LYMPHSABS 2.2 2.3  --   MONOABS 0.9 0.5  --   EOSABS 0.1 0.1  --   BASOSABS 0.1 0.1  --     Chemistries  Recent Labs  Lab 09/03/20 1225  09/03/20 1443 09/04/20 0353  NA 129* 128* 138  K 4.9 5.6* 3.7  CL 92* 92* 101  CO2 25 24 28   GLUCOSE 577* 568* 164*  BUN 17 18 13   CREATININE 1.74* 1.74* 1.15  CALCIUM 8.5* 8.6* 8.4*  AST  --  14*  --  ALT  --  18  --   ALKPHOS  --  100  --   BILITOT  --  0.9  --    ------------------------------------------------------------------------------------------------------------------ No results for input(s): CHOL, HDL, LDLCALC, TRIG, CHOLHDL, LDLDIRECT in the last 72 hours.  Lab Results  Component Value Date   HGBA1C 14.6 (A) 05/08/2020   ------------------------------------------------------------------------------------------------------------------ No results for input(s): TSH, T4TOTAL, T3FREE, THYROIDAB in the last 72 hours.  Invalid input(s): FREET3 ------------------------------------------------------------------------------------------------------------------ No results for input(s): VITAMINB12, FOLATE, FERRITIN, TIBC, IRON, RETICCTPCT in the last 72 hours.  Coagulation profile Recent Labs  Lab 09/03/20 1443  INR 1.0    No results for input(s): DDIMER in the last 72 hours.  Cardiac Enzymes No results for input(s): CKMB, TROPONINI, MYOGLOBIN in the last 168 hours.  Invalid input(s): CK ------------------------------------------------------------------------------------------------------------------ No results found for: BNP   Shon Hale M.D on 09/04/2020 at 10:24 AM  Go to [email or web address] - for contact info  [facility] - Office  [phone]

## 2020-09-04 NOTE — Consult Note (Signed)
Urology Consult  Referring physician: Dr. Aileen PilotZammitt Reason for referral: scrotal abscess  Chief Complaint: scrotal pain  History of Present Illness: Mr [name] [name] is a 50yo with a history of poorly controlled DMII who presented to the ER with a 1 week history of worsening scrotal swelling. He was referred by Dr[facility] Lovell [facility]kins [facility] for a scrotal sebaceous cyst and was scheduled to be seen Friday. He developed fever and chills at home and elected to present to the ER. The scrotal pain is sharp, constant, moderate and nonradiating. No other associated symptoms symptoms. No exacerbating/ alleviating events.   Past Medical History:  Diagnosis Date  . Anxiety   . Arthritis   . CAD (coronary artery disease)    Moderate LAD disease 2016 - Dr. Jacinto HalimGanji  . Chronic bronchitis (HCC)   . Chronic upper back pain   . COPD (chronic obstructive pulmonary disease) (HCC)   . Depression   . Diabetic peripheral neuropathy (HCC)   . GERD (gastroesophageal reflux disease)   . History of gout   . Hyperlipemia   . Hypertension   . Migraine   . Noncompliance   . 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 Sleep apnea 2016   "could not afford CPAP".  . Type 2 diabetes mellitus (HCC)    Past Surgical History:  Procedure Laterality Date  . ANKLE SURGERY Right 1982   "had extra bones in there; took them out"  . APPENDECTOMY  1975  . BIOPSY  12/26/2018   Procedure: BIOPSY;  Surgeon: Malissa Hippoehman, Najeeb U, MD;  [facility];  Service: Endoscopy;;  duodenal biopsies  . BIOPSY  03/23/2019   Procedure: BIOPSY;  Surgeon: Malissa Hippoehman, Najeeb U, MD;  Location: [facility];  Service: Endoscopy;;  esophagus  . CARDIAC CATHETERIZATION N/A 03/28/2015   Procedure: Left Heart Cath and Coronary Angiography;  Surgeon: Yates DecampJay Ganji, MD;  [facility];  Service: Cardiovascular;  Laterality: N/A;  . CARDIAC CATHETERIZATION N/A 03/28/2015   Procedure: Intravascular Pressure Wire/FFR Study;  Surgeon: Yates DecampJay Ganji, MD;   [facility];  Service: Cardiovascular;  Laterality: N/A;  . CARPAL TUNNEL RELEASE Left ~ 2008  . COLONOSCOPY WITH ESOPHAGOGASTRODUODENOSCOPY (EGD)    . COLONOSCOPY WITH PROPOFOL N/A 12/24/2019   Procedure: COLONOSCOPY WITH PROPOFOL;  Surgeon: Malissa Hippoehman, Najeeb U, MD;  [facility];  Service: Endoscopy;  Laterality: N/A;  955  . ELBOW FRACTURE SURGERY Left ~ 2008  . ESOPHAGOGASTRODUODENOSCOPY N/A 12/26/2018   Procedure: ESOPHAGOGASTRODUODENOSCOPY (EGD);  Surgeon: Malissa Hippoehman, Najeeb U, MD;  [facility];  Service: Endoscopy;  Laterality: N/A;  . ESOPHAGOGASTRODUODENOSCOPY (EGD) WITH PROPOFOL N/A 03/23/2019   Procedure: ESOPHAGOGASTRODUODENOSCOPY (EGD) WITH PROPOFOL;  Surgeon: Malissa Hippoehman, Najeeb U, MD;  [facility];  Service: Endoscopy;  Laterality: N/A;  7:30  . FRACTURE SURGERY     ankle and elbow  . PILONIDAL CYST EXCISION N/A 03/24/2017   Procedure: EXCISION CHRONIC  PILONIDAL ABSCESS;  Surgeon: [name] MiyamotoBlackman, Douglas, MD;  Location: [facility];  Service: General;  Laterality: N/A;  . TENDON REPAIR Left ~ 2004   "main tendon in my ankle"    Medications: I have reviewed the patient's current medications. Allergies:  Allergies  Allergen Reactions  . Omeprazole Magnesium Swelling    Face swells, no breathing impairment  . Bee Venom   . Esomeprazole Swelling    Face swells, no breathing impairment  . Morphine And Related     Itching at IV site, dry heaving,  burning sensation in throat.    Family History  Problem Relation Age of Onset  . Congestive Heart Failure Sister   . Diabetes Mother   . Hypertension Mother   . Cancer Mother   . Diabetes Father   . Hypertension Father   . Hyperlipidemia Father    Social History:  reports that he has been smoking cigarettes. He has a 17.00 pack-year smoking history. He has never used smokeless tobacco. He reports that he does not drink alcohol and does not use drugs.  Review of Systems  Constitutional: Positive  for fever.  Genitourinary: Positive for scrotal swelling and testicular pain.  All other systems reviewed and are negative.   Physical Exam:  Vital signs in last 24 hours: Temp:  [97.7 F (36.5 C)-98.3 F (36.8 C)] 98.2 F (36.8 C) (04/07 0532) Pulse Rate:  [83-114] 88 (04/07 0532) Resp:  [5-27] 20 (04/07 0532) BP: (93-150)/(64-102) 149/102 (04/07 0532) SpO2:  [90 %-100 %] 98 % (04/07 0532) Weight:  [73 kg-77.2 kg] 77.2 kg (04/07 0542) Physical Exam Vitals reviewed.  Constitutional:      Appearance: Normal appearance.  HENT:     Head: Normocephalic and atraumatic.  Eyes:     Extraocular Movements: Extraocular movements intact.     Pupils: Pupils are equal, round, and reactive to light.  Cardiovascular:     Rate and Rhythm: Normal rate and regular rhythm.  Pulmonary:     Effort: Pulmonary effort is normal.     Breath sounds: Normal breath sounds.  Abdominal:     General: Abdomen is flat. There is no distension.     Hernia: There is no hernia in the left inguinal area or right inguinal area.  Genitourinary:    Penis: Normal and circumcised.      Testes: Normal.     Epididymis:     Right: Normal.     Left: Normal.     Comments: 5cm posterior midline scrotal abscess.  Musculoskeletal:        General: No swelling. Normal range of motion.     Cervical back: Normal range of motion and neck supple.  Lymphadenopathy:     Lower Body: No right inguinal adenopathy. No left inguinal adenopathy.  Skin:    General: Skin is warm and dry.  Neurological:     General: No focal deficit present.     Mental Status: He is alert and oriented to person, place, and time.  Psychiatric:        Mood and Affect: Mood normal.        Behavior: Behavior normal.        Thought Content: Thought content normal.        Judgment: Judgment normal.     Laboratory Data:  Results for orders placed or performed during the hospital encounter of 09/03/20 (from the past 72 hour(s))  CBC with  Differential/Platelet     Status: Abnormal   Collection Time: 09/03/20  2:43 PM  Result Value Ref Range   WBC 18.3 (H) 4.0 - 10.5 K/uL   RBC 4.73 4.22 - 5.81 MIL/uL   Hemoglobin 14.8 13.0 - 17.0 g/dL   HCT 16.1 09.6 - 04.5 %   MCV 92.2 80.0 - 100.0 fL   MCH 31.3 26.0 - 34.0 pg   MCHC 33.9 30.0 - 36.0 g/dL   RDW 40.9 81.1 - 91.4 %   Platelets 287 150 - 400 K/uL   nRBC 0.0 0.0 - 0.2 %   Neutrophils Relative % 84 %  Neutro Abs 15.3 (H) 1.7 - 7.7 K/uL   Lymphocytes Relative 13 %   Lymphs Abs 2.3 0.7 - 4.0 K/uL   Monocytes Relative 3 %   Monocytes Absolute 0.5 0.1 - 1.0 K/uL   Eosinophils Relative 0 %   Eosinophils Absolute 0.1 0.0 - 0.5 K/uL   Basophils Relative 0 %   Basophils Absolute 0.1 0.0 - 0.1 K/uL   Immature Granulocytes 0 %   Abs Immature Granulocytes 0.06 0.00 - 0.07 K/uL    Comment: Performed at [facility], [address], [place], [place] [address]  Comprehensive metabolic panel     Status: Abnormal   Collection Time: 09/03/20  2:43 PM  Result Value Ref Range   Sodium 128 (L) 135 - 145 mmol/L   Potassium 5.6 (H) 3.5 - 5.1 mmol/L   Chloride 92 (L) 98 - 111 mmol/L   CO2 24 22 - 32 mmol/L   Glucose, Bld 568 (HH) 70 - 99 mg/dL    Comment: Glucose reference range applies only to samples taken after fasting for at least 8 hours. CRITICAL RESULT CALLED TO, READ BACK BY AND VERIFIED WITH: DAVIS,N ON 09/03/20 AT 1650 BY LOY,C    BUN 18 6 - 20 mg/dL   Creatinine, Ser 0.27 (H) 0.61 - 1.24 mg/dL   Calcium 8.6 (L) 8.9 - 10.3 mg/dL   Total Protein 6.8 6.5 - 8.1 g/dL   Albumin 3.3 (L) 3.5 - 5.0 g/dL   AST 14 (L) 15 - 41 U/L   ALT 18 0 - 44 U/L   Alkaline Phosphatase 100 38 - 126 U/L   Total Bilirubin 0.9 0.3 - 1.2 mg/dL   GFR, Estimated 47 (L) >60 mL/min    Comment: (NOTE) Calculated using the CKD-EPI Creatinine Equation (2021)    Anion gap 12 5 - 15    Comment: Performed at [facility], [address], [place], [place] [address]  Lactic acid, plasma     Status: Abnormal    Collection Time: 09/03/20  2:43 PM  Result Value Ref Range   Lactic Acid, Venous 2.0 (HH) 0.5 - 1.9 mmol/L    Comment: CRITICAL RESULT CALLED TO, READ BACK BY AND VERIFIED WITH: DAVIS,N ON 09/03/20 AT 1555 BY LOY,C Performed at [facility], [address], [place], [place] [address]   Protime-INR     Status: None   Collection Time: 09/03/20  2:43 PM  Result Value Ref Range   Prothrombin Time 12.7 11.4 - 15.2 seconds   INR 1.0 0.8 - 1.2    Comment: (NOTE) INR goal varies based on device and disease states. Performed at [facility], [address] [place], [place] [address]   APTT     Status: None   Collection Time: 09/03/20  2:43 PM  Result Value Ref Range   aPTT 27 24 - 36 seconds    Comment: Performed at [facility], [address], [place], [place] [address]  Resp Panel by RT-PCR (Flu A&B, Covid) Nasopharyngeal Swab     Status: None   Collection Time: 09/03/20  3:36 PM   Specimen: Nasopharyngeal Swab; Nasopharyngeal(NP) swabs in vial transport medium  Result Value Ref Range   SARS Coronavirus 2 by RT PCR NEGATIVE NEGATIVE    Comment: (NOTE) SARS-CoV-2 target nucleic acids are NOT DETECTED.  The SARS-CoV-2 RNA is generally detectable in upper respiratory specimens during the acute phase of infection. The lowest concentration of SARS-CoV-2 viral copies this assay can detect is 138 copies/mL. A negative result does  not preclude SARS-Cov-2 infection and should not be used as the sole basis for treatment or other patient management decisions. A negative result may occur with  improper specimen collection/handling, submission of specimen other than nasopharyngeal swab, presence of viral mutation(s) within the areas targeted by this assay, and inadequate number of viral copies(<138 copies/mL). A negative result must be combined with clinical observations, patient history, and epidemiological information. The expected result is Negative.  Fact Sheet for Patients:   BloggerCourse.com  Fact Sheet for Healthcare Providers:  SeriousBroker.it  This test is no t yet approved or cleared by the [place] FDA and  has been authorized for detection and/or diagnosis of SARS-CoV-2 by FDA under an Emergency Use Authorization (EUA). This EUA will remain  in effect (meaning this test can be used) for the duration of the COVID-19 declaration under Section 564(b)(1) of the Act, 21 U.S.C.section 360bbb-3(b)(1), unless the authorization is terminated  or revoked sooner.       Influenza A by PCR NEGATIVE NEGATIVE   Influenza B by PCR NEGATIVE NEGATIVE    Comment: (NOTE) The Xpert Xpress SARS-CoV-2/FLU/RSV plus assay is intended as an aid in the diagnosis of influenza from Nasopharyngeal swab specimens and should not be used as a sole basis for treatment. Nasal washings and aspirates are unacceptable for Xpert Xpress SARS-CoV-2/FLU/RSV testing.  Fact Sheet for Patients: BloggerCourse.com  Fact Sheet for Healthcare Providers: SeriousBroker.it  This test is not yet approved or cleared by the [place] FDA and has been authorized for detection and/or diagnosis of SARS-CoV-2 by FDA under an Emergency Use Authorization (EUA). This EUA will remain in effect (meaning this test can be used) for the duration of the COVID-19 declaration under Section 564(b)(1) of the Act, 21 U.S.C. section 360bbb-3(b)(1), unless the authorization is terminated or revoked.  Performed at [facility], [address] [place], [place] [address]   Lactic acid, plasma     Status: Abnormal   Collection Time: 09/03/20  5:35 PM  Result Value Ref Range   Lactic Acid, Venous 2.4 (HH) 0.5 - 1.9 mmol/L    Comment: CRITICAL VALUE NOTED.  VALUE IS CONSISTENT WITH PREVIOUSLY REPORTED AND CALLED VALUE. Performed at [facility], [address], [place], [place] [address]   CBG monitoring,  ED     Status: Abnormal   Collection Time: 09/03/20  6:37 PM  Result Value Ref Range   Glucose-Capillary 297 (H) 70 - 99 mg/dL    Comment: Glucose reference range applies only to samples taken after fasting for at least 8 hours.  Lactic acid, plasma     Status: Abnormal   Collection Time: 09/03/20  8:46 PM  Result Value Ref Range   Lactic Acid, Venous 2.0 (HH) 0.5 - 1.9 mmol/L    Comment: CRITICAL VALUE NOTED.  VALUE IS CONSISTENT WITH PREVIOUSLY REPORTED AND CALLED VALUE. Performed at [facility], [address], [place], [place] [address]   CBG monitoring, ED     Status: Abnormal   Collection Time: 09/03/20  9:53 PM  Result Value Ref Range   Glucose-Capillary 202 (H) 70 - 99 mg/dL    Comment: Glucose reference range applies only to samples taken after fasting for at least 8 hours.   Comment 1 Notify RN   Basic metabolic panel     Status: Abnormal   Collection Time: 09/04/20  3:53 AM  Result Value Ref Range   Sodium 138 135 - 145 mmol/L    Comment: DELTA CHECK NOTED   Potassium  3.7 3.5 - 5.1 mmol/L   Chloride 101 98 - 111 mmol/L   CO2 28 22 - 32 mmol/L   Glucose, Bld 164 (H) 70 - 99 mg/dL    Comment: Glucose reference range applies only to samples taken after fasting for at least 8 hours.   BUN 13 6 - 20 mg/dL   Creatinine, Ser 0.98 0.61 - 1.24 mg/dL   Calcium 8.4 (L) 8.9 - 10.3 mg/dL   GFR, Estimated >11 >91 mL/min    Comment: (NOTE) Calculated using the CKD-EPI Creatinine Equation (2021)    Anion gap 9 5 - 15    Comment: Performed at [facility], [address] [place], [place] [address]  CBC     Status: Abnormal   Collection Time: 09/04/20  3:53 AM  Result Value Ref Range   WBC 11.7 (H) 4.0 - 10.5 K/uL   RBC 4.23 4.22 - 5.81 MIL/uL   Hemoglobin 13.2 13.0 - 17.0 g/dL   HCT 56.2 13.0 - 86.5 %   MCV 93.9 80.0 - 100.0 fL   MCH 31.2 26.0 - 34.0 pg   MCHC 33.2 30.0 - 36.0 g/dL   RDW 78.4 69.6 - 29.5 %   Platelets 258 150 - 400 K/uL   nRBC 0.0 0.0 - 0.2 %    Comment:  Performed at [facility], [address] [place], [place] [address]  Glucose, capillary     Status: Abnormal   Collection Time: 09/04/20  7:27 AM  Result Value Ref Range   Glucose-Capillary 149 (H) 70 - 99 mg/dL    Comment: Glucose reference range applies only to samples taken after fasting for at least 8 hours.   Recent Results (from the past 240 hour(s))  Resp Panel by RT-PCR (Flu A&B, Covid) Nasopharyngeal Swab     Status: None   Collection Time: 09/03/20  3:36 PM   Specimen: Nasopharyngeal Swab; Nasopharyngeal(NP) swabs in vial transport medium  Result Value Ref Range Status   SARS Coronavirus 2 by RT PCR NEGATIVE NEGATIVE Final    Comment: (NOTE) SARS-CoV-2 target nucleic acids are NOT DETECTED.  The SARS-CoV-2 RNA is generally detectable in upper respiratory specimens during the acute phase of infection. The lowest concentration of SARS-CoV-2 viral copies this assay can detect is 138 copies/mL. A negative result does not preclude SARS-Cov-2 infection and should not be used as the sole basis for treatment or other patient management decisions. A negative result may occur with  improper specimen collection/handling, submission of specimen other than nasopharyngeal swab, presence of viral mutation(s) within the areas targeted by this assay, and inadequate number of viral copies(<138 copies/mL). A negative result must be combined with clinical observations, patient history, and epidemiological information. The expected result is Negative.  Fact Sheet for Patients:  BloggerCourse.com  Fact Sheet for Healthcare Providers:  SeriousBroker.it  This test is no t yet approved or cleared by the [place] FDA and  has been authorized for detection and/or diagnosis of SARS-CoV-2 by FDA under an Emergency Use Authorization (EUA). This EUA will remain  in effect (meaning this test can be used) for the duration of the COVID-19  declaration under Section 564(b)(1) of the Act, 21 U.S.C.section 360bbb-3(b)(1), unless the authorization is terminated  or revoked sooner.       Influenza A by PCR NEGATIVE NEGATIVE Final   Influenza B by PCR NEGATIVE NEGATIVE Final    Comment: (NOTE) The Xpert Xpress SARS-CoV-2/FLU/RSV plus assay is intended as an aid in the diagnosis of influenza  from Nasopharyngeal swab specimens and should not be used as a sole basis for treatment. Nasal washings and aspirates are unacceptable for Xpert Xpress SARS-CoV-2/FLU/RSV testing.  Fact Sheet for Patients: BloggerCourse.com  Fact Sheet for Healthcare Providers: SeriousBroker.it  This test is not yet approved or cleared by the [place] FDA and has been authorized for detection and/or diagnosis of SARS-CoV-2 by FDA under an Emergency Use Authorization (EUA). This EUA will remain in effect (meaning this test can be used) for the duration of the COVID-19 declaration under Section 564(b)(1) of the Act, 21 U.S.C. section 360bbb-3(b)(1), unless the authorization is terminated or revoked.  Performed at [facility], [address], [place], [place] [address]    Creatinine: Recent Labs    09/03/20 1225 09/03/20 1443 09/04/20 0353  CREATININE 1.74* 1.74* 1.15   Baseline Creatinine: 1.1  Procedure note:  Pre procedure diagnosis: scrotal abscess  Post procedure diagnosis: Same  Procedure: 1. Incision and drainage of scrotal abscess  Attending: Wilkie Aye, MD  Anesthesia: local 2% lidocaine  History of blood loss: Minimal   Findings: 5cm posterior midline scrotal abscess  Indications: Patient is a 50 year old male with a history of sebaceous cysts and new scrotal abscess.  We discussed the treatment options and after discussing the options the patient elects for incision and drainage of the abscess.  Procedure in detail: Prior to procedure consent was obtained. His  genitalia was then prepped and draped in usual sterile fashion.  Local anesthesia was administered   A 3 cm incision was made over the abscess and copious purulent drainage was noted. We then used a hemostat to break the loculations and then the abscess cavity was packed with iodiform gauze.   A dressing was then applied to the incision.  We then placed a scrotal fluff and this then concluded the procedure which was well tolerated by the patient..   Impression/Assessment:  50yo with a scrotal abscess  Plan:  1. Scrotal abscess drained and packed with iodiform gauze int he ER. He should continue daily packing change and continue zosyn pending cultures.  Wilkie Aye 09/04/2020, 8:18 AM

## 2020-09-04 NOTE — Progress Notes (Signed)
Inpatient Diabetes Program Recommendations  AACE/ADA: New Consensus Statement on Inpatient Glycemic Control (2015)  Target Ranges:  Prepandial:   less than 140 mg/dL      Peak postprandial:   less than 180 mg/dL (1-2 hours)      Critically ill patients:  140 - 180 mg/dL   Lab Results  Component Value Date   GLUCAP 149 (H) 09/04/2020   HGBA1C 14.6 (A) 05/08/2020    Review of Glycemic Control Results for [name] [name], ALVIDREZ (MRN [id number]) as of 09/04/2020 09:57  Ref. Range 09/03/2020 18:37 09/03/2020 21:53 09/04/2020 07:27  Glucose-Capillary Latest Ref Range: 70 - 99 mg/dL 620 (H) 355 (H) 974 (H)   Diabetes history: DM 2 Outpatient Diabetes medications: Novolog 14-20 units tid with meals, Lantus 65 units daily, Metformin 500 mg bid Current orders for Inpatient glycemic control:  Novolog moderate tid with meals and HS Lantus 50 units q HS Inpatient Diabetes Program Recommendations:    Blood sugars improved.  A1C Pending.  Recommend adding Novolog 5 units tid with meals (hold if patient eats less than 50% or NPO).   Thanks,  Beryl Meager, RN, BC-ADM Inpatient Diabetes Coordinator Pager [phone] (8a-5p)

## 2020-09-05 ENCOUNTER — Ambulatory Visit: Payer: Self-pay | Admitting: Urology

## 2020-09-05 DIAGNOSIS — N5089 Other specified disorders of the male genital organs: Secondary | ICD-10-CM

## 2020-09-05 LAB — GLUCOSE, CAPILLARY
Glucose-Capillary: 98 mg/dL (ref 70–99)
Glucose-Capillary: 82 mg/dL (ref 70–99)
Glucose-Capillary: 80 mg/dL (ref 70–99)

## 2020-09-05 LAB — HEMOGLOBIN A1C
Hgb A1c MFr Bld: >15.5 % — ABNORMAL HIGH (ref 4.8–5.6)
Mean Plasma Glucose: >398 mg/dL

## 2020-09-05 MED ORDER — ASPIRIN 81 MG PO TABS: 81.0000 mg | ORAL_TABLET | Freq: Every day | ORAL | 3 refills | Status: DC

## 2020-09-05 MED ORDER — DOXYCYCLINE HYCLATE 100 MG PO TABS: 100.0000 mg | ORAL_TABLET | Freq: Two times a day (BID) | ORAL | 0 refills | Status: AC

## 2020-09-05 MED ORDER — AMOXICILLIN-POT CLAVULANATE 875-125 MG PO TABS: 1.0000 | ORAL_TABLET | Freq: Two times a day (BID) | ORAL | 0 refills | Status: AC

## 2020-09-05 NOTE — Discharge Instructions (Signed)
1) please take Augmentin and doxycycline antibiotic as prescribed for the next week 2) please follow-up with urologist Dr. Ronne Binning on 09/17/2020- --[facility], [address], [place] [address] Phone Number----[phone] 3) daily iodoform packing and gauze dressing to left scrotal abscess/wound as instructed

## 2020-09-05 NOTE — Discharge Summary (Signed)
[name] [name], is a 50 y.o. male  DOB 10-11-1970  MRN [id number].  Admission date:  09/03/2020  Admitting Physician  Onnie Boer, MD  Discharge Date:  09/05/2020   Primary MD  [name], PA-C  Recommendations for primary care physician for things to follow:   1) please take Augmentin and doxycycline antibiotic as prescribed for the next week 2) please follow-up with urologist Dr. Ronne [name] on 09/17/2020- --[facility], [address], [address], [place] [address] Phone Number----[phone] 3) daily iodoform packing and gauze dressing to left scrotal abscess/wound as instructed   Admission Diagnosis  Scrotal wall abscess [N49.2] Sepsis (HCC) [A41.9] Sepsis with acute renal failure without septic shock, due to unspecified organism, unspecified acute renal failure type (HCC) [A41.9, R65.20, N17.9]   Discharge Diagnosis  Scrotal wall abscess [N49.2] Sepsis (HCC) [A41.9] Sepsis with acute renal failure without septic shock, due to unspecified organism, unspecified acute renal failure type (HCC) [A41.9, R65.20, N17.9]    Principal Problem:   Severe sepsis (HCC) Active Problems:   MDD (major depressive disorder), severe (HCC)   Uncontrolled type 2 diabetes mellitus with hyperglycemia (HCC)   ARF (acute renal failure) (HCC)   Diabetic gastroparesis (HCC)   CAD (coronary artery disease)   Current smoker      Past Medical History:  Diagnosis Date  . Anxiety   . Arthritis   . CAD (coronary artery disease)    Moderate LAD disease 2016 - Dr. Jacinto Halim  . Chronic bronchitis (HCC)   . Chronic upper back pain   . COPD (chronic obstructive pulmonary disease) (HCC)   . Depression   . Diabetic peripheral neuropathy (HCC)   . GERD (gastroesophageal reflux disease)   . History of gout   . Hyperlipemia   . Hypertension   . Migraine   . Noncompliance   . Ringing in the ears,  bilateral   . Sleep apnea 2016   "could not afford CPAP".  . Type 2 diabetes mellitus (HCC)     Past Surgical History:  Procedure Laterality Date  . ANKLE SURGERY Right 1982   "had extra bones in there; took them out"  . APPENDECTOMY  1975  . BIOPSY  12/26/2018   Procedure: BIOPSY;  Surgeon: Malissa Hippo, MD;  [facility];  Service: Endoscopy;;  duodenal biopsies  . BIOPSY  03/23/2019   Procedure: BIOPSY;  Surgeon: Malissa Hippo, MD;  [facility]: [facility];  Service: Endoscopy;;  esophagus  . CARDIAC CATHETERIZATION N/A 03/28/2015   Procedure: Left Heart Cath and Coronary Angiography;  Surgeon: Yates Decamp, MD;  Location: [facility] INVASIVE CV LAB;  Service: Cardiovascular;  Laterality: N/A;  . CARDIAC CATHETERIZATION N/A 03/28/2015   Procedure: Intravascular Pressure Wire/FFR Study;  Surgeon: Yates Decamp, MD;  Location: [facility] INVASIVE CV LAB;  Service: Cardiovascular;  Laterality: N/A;  . CARPAL TUNNEL RELEASE Left ~ 2008  . COLONOSCOPY WITH ESOPHAGOGASTRODUODENOSCOPY (EGD)    . COLONOSCOPY WITH PROPOFOL N/A 12/24/2019   Procedure: COLONOSCOPY WITH PROPOFOL;  Surgeon: Malissa Hippo, MD;  [facility];  Service: Endoscopy;  Laterality: N/A;  955  . ELBOW FRACTURE SURGERY Left ~ 2008  . ESOPHAGOGASTRODUODENOSCOPY N/A 12/26/2018   Procedure: ESOPHAGOGASTRODUODENOSCOPY (EGD);  Surgeon: Malissa Hippo, MD;  [facility];  Service: Endoscopy;  Laterality: N/A;  . ESOPHAGOGASTRODUODENOSCOPY (EGD) WITH PROPOFOL N/A 03/23/2019   Procedure: ESOPHAGOGASTRODUODENOSCOPY (EGD) WITH PROPOFOL;  Surgeon: Malissa Hippo, MD;  [facility]: [facility];  Service: Endoscopy;  Laterality: N/A;  7:30  . FRACTURE SURGERY     ankle and elbow  . PILONIDAL CYST EXCISION N/A 03/24/2017   Procedure: EXCISION CHRONIC  PILONIDAL ABSCESS;  Surgeon: [name] Miyamoto, MD;  Location: [facility];  Service: General;  Laterality: N/A;  . TENDON REPAIR Left ~ 2004   "main tendon in my ankle"        HPI  from the history and physical done on the day of admission:   Chief Complaint: Abnormal lab, scrotal swelling.  HPI: [name] [name] is a 50 y.o. male with medical history significant for diabetes mellitus with gastroparesis, coronary artery disease, hypertension, COPD, depression. Patient presented to the ED with reports of abnormal labs and swelling in his groin.  Patient was told that his creatinine and white blood cell counts we are both elevated.  Both reports that he has had a swelling/mass in his left groin of at least 3 weeks duration.  He also reports brownish drainage from the swelling.  Patient was referred to general surgery, he saw Dr. Fayrene Fearing, and was referred to urology.  Patient reports shaking chills, but no fevers. Also  reports vomiting, but this is almost a daily occurrence  after eating in the evenings.  He has baseline/chronic intermittent diarrhea.  He has had 2 bowel movements today. Patient is not compliant with his medications which include his insulins.  ED Course: 98.6.  Heart rate 92-108.  Leak 93-145.  O2 sats greater than 96% on room air.  WBC 18.3.  Blood glucose 577.  Creatinine 1.74. Lactic acid 2.  Blood glucose 568. Urologist Dr. Ronne [name] consulted in the ED,  Review of Systems: As per HPI all other systems reviewed and negative.      Hospital Course:      Brief Summary:- 50 y.o.malewith medical history significant fordiabetes mellitus with gastroparesis,coronary artery disease, hypertension, COPD, depression admitted on 09/03/20 with Sepsis from Scrotal abscess/cellulitis--  A/p 1)Severe Sepsis due to scrotal abscess/cellulitis--status post I&D by urologist Dr. Ronne [name] on 09/03/2020 -Treated with IV Zosyn   continue  packing scrotal wound daily with  iodiform gauze  -WBC is down to 11.7 from 21.6 -Patient afebrile a -Sepsis pathophysiology resolved -Patient seen at bedside with Dr. Ronne [name], okay to discharge home on p.o. Augmentin,  will add doxycycline for possible MRSA coverage  2)AKI--- creatinine is down to 1.1 from 1.74 on admission with hydration - renally adjust medications, avoid nephrotoxic agents / dehydration  / hypotension  3)DM2-with history of diabetic gastroparesis -A1c 4 months ago was 14.6 reflecting uncontrolled DM with hyperglycemia PTA -Outpatient follow-up with PCP for adjustment of diabetic regimen advised  4)Elevated BP--BP is improved, no routine BP meds, follow-up with PCP for recheck of BP in a couple of weeks   5)HLD-continue atorvastatin 80 mg daily  6)Depression and Anxiety--- stable, continue Abilify and Zoloft 100 mg daily  Disposition home with mom and dad   Disposition: The patient is from: Home  Anticipated d/c is to: Home  Code Status :  -  Code Status: Full Code   Family Communication:    NA (patient is alert, awake and coherent)   Consults  :  urology  Discharge Condition: stable  Follow UP   Follow-up Information    McKenzie, Mardene Celeste, MD Follow up on 09/17/2020.   Specialty: Urology Contact information: [address] [place] [address] [phone]                Diet and Activity recommendation:  As advised  Discharge Instructions    Discharge Instructions    Call MD for:  difficulty breathing, headache or visual disturbances   Complete by: As directed    Call MD for:  persistant dizziness or light-headedness   Complete by: As directed    Call MD for:  persistant nausea and vomiting   Complete by: As directed    Call MD for:  redness, tenderness, or signs of infection (pain, swelling, redness, odor or green/yellow discharge around incision site)   Complete by: As directed    Call MD for:  severe uncontrolled pain   Complete by: As directed    Call MD for:  temperature >100.4   Complete by: As directed    Diet - low sodium heart healthy   Complete by: As directed    Diet Carb Modified   Complete by: As  directed    Discharge instructions   Complete by: As directed    1) please take Augmentin and doxycycline antibiotic as prescribed for the next week 2) please follow-up with urologist Dr. Ronne [name] on 09/17/2020- --[facility], [address], [address], [place] [address] Phone Number----[phone] 3) daily iodoform packing and gauze dressing to left scrotal abscess/wound as instructed   Discharge wound care:   Complete by: As directed    daily iodoform packing and gauze dressing to left scrotal abscess/wound as instructed   Increase activity slowly   Complete by: As directed         Discharge Medications     Allergies as of 09/05/2020      Reactions   Omeprazole Magnesium Swelling   Face swells, no breathing impairment   Bee Venom    Esomeprazole Swelling   Face swells, no breathing impairment   Morphine And Related    Itching at IV site, dry heaving, burning sensation in throat.      Medication List    STOP taking these medications   erythromycin ophthalmic ointment   ibuprofen 200 MG tablet Commonly known as: ADVIL   isosorbide mononitrate 30 MG 24 hr tablet Commonly known as: IMDUR   LUBRICATING EYE DROPS OP     TAKE these medications   acetaminophen 325 MG tablet Commonly known as: TYLENOL Take 2 tablets (650 mg total) by mouth every 6 (six) hours as needed for moderate pain. What changed: how much to take   albuterol 108 (90 Base) MCG/ACT inhaler Commonly known as: Proventil HFA INHALE 2 PUFFS BY MOUTH EVERY 6 HOURS AS NEEDED FOR COUGHING, WHEEZING, OR SHORTNESS OF BREATH What changed:   how much to take  how to take this  when to take this  reasons to take this  additional instructions   amoxicillin-clavulanate 875-125 MG tablet Commonly known as: Augmentin Take 1 tablet by mouth 2 (two) times daily for 7 days.   ARIPiprazole 5 MG tablet Commonly known as: ABILIFY Take 5 mg by mouth daily.   aspirin 81 MG tablet Take 1  tablet (81 mg total) by  mouth daily with breakfast. What changed: when to take this   atorvastatin 80 MG tablet Commonly known as: LIPITOR TAKE 1 Tablet BY MOUTH ONCE EVERY DAY   doxycycline 100 MG tablet Commonly known as: VIBRA-TABS Take 1 tablet (100 mg total) by mouth 2 (two) times daily for 7 days.   Dulera 100-5 MCG/ACT Aero Generic drug: mometasone-formoterol INHALE 2 PUFFS BY MOUTH TWICE DAILY. RINSE MOUTH AFTER EACH USE What changed: See the new instructions.   insulin aspart 100 UNIT/ML injection Commonly known as: NovoLOG Inject 14-20 Units into the skin 3 (three) times daily before meals.   Lantus SoloStar 100 UNIT/ML Solostar Pen Generic drug: insulin glargine Inject 60 Units into the skin at bedtime. What changed: how much to take   metFORMIN 500 MG tablet Commonly known as: GLUCOPHAGE Take 1 tablet (500 mg total) by mouth 2 (two) times daily with a meal. What changed: Another medication with the same name was removed. Continue taking this medication, and follow the directions you see here.   metoCLOPramide 5 MG tablet Commonly known as: Reglan Take 1 tablet (5 mg total) by mouth 4 (four) times daily -  before meals and at bedtime.   nitroGLYCERIN 0.4 MG SL tablet Commonly known as: NITROSTAT Place 1 tablet (0.4 mg total) under the tongue every 5 (five) minutes as needed for chest pain.   pantoprazole 40 MG tablet Commonly known as: PROTONIX Take 1 tablet (40 mg total) by mouth daily.   promethazine 25 MG suppository Commonly known as: PHENERGAN Place 1 suppository (25 mg total) rectally every 8 (eight) hours as needed for refractory nausea / vomiting. What changed: Another medication with the same name was removed. Continue taking this medication, and follow the directions you see here.   sertraline 100 MG tablet Commonly known as: ZOLOFT Take 1 tablet by mouth daily.            Discharge Care Instructions  (From admission, onward)          Start     Ordered   09/05/20 0000  Discharge wound care:       Comments: daily iodoform packing and gauze dressing to left scrotal abscess/wound as instructed   09/05/20 1338          Major procedures and Radiology Reports - PLEASE review detailed and final reports for all details, in brief -    US Scrotum  Result Date: 08/12/2020 CLINICAL DATA:  Testicular mass, abscess posterior scrotum EXAM: ULTRASOUND OF SCROTUM TECHNIQUE: Complete ultrasound examination of the testicles, epididymis, and other scrotal structures was performed. COMPARISON:  CT 11/07/2018 FINDINGS: Right testicle Measurements: 4.2 x 1.9 x 3.2 cm. No mass or microlithiasis visualized. Left testicle Measurements: 3.4 x 1.8 x 3.4 cm. No mass. Scattered microcalcifications. Right epididymis:  4 mm epididymal head cyst.  Otherwise normal. Left epididymis:  Normal in size and appearance. Hydrocele:  Small right hydrocele Varicocele:  None visualized. Other: Complex fluid collection seen within the posterolateral left scrotum. Area measures approximately 5.8 x 3.8 x 2.5 cm. IMPRESSION: No testicular abnormality or evidence of torsion. Complex fluid collection within the left posterior and lateral scrotal wall measuring up to 5.8 cm which may reflect abscess. Electronically Signed   By: Charlett Nose M.D.   On: 08/12/2020 11:08   DG Chest Port 1 View  Result Date: 09/03/2020 CLINICAL DATA:  Dehydration EXAM: PORTABLE CHEST 1 VIEW COMPARISON:  December 23, 2018 FINDINGS: No edema or airspace opacity. Heart size and pulmonary vascularity  are normal. No adenopathy. No bone lesions. IMPRESSION: No edema or airspace opacity. Cardiac silhouette within normal limits. Electronically Signed   By: Bretta Bang III M.D.   On: 09/03/2020 15:52    Micro Results    Recent Results (from the past 240 hour(s))  Blood Culture (routine x 2)     Status: None (Preliminary result)   Collection Time: 09/03/20  3:25 PM   Specimen: BLOOD RIGHT HAND   Result Value Ref Range Status   Specimen Description BLOOD RIGHT HAND  Final   Special Requests   Final    BOTTLES DRAWN AEROBIC AND ANAEROBIC Blood Culture results may not be optimal due to an inadequate volume of blood received in culture bottles   Culture   Final    NO GROWTH 2 DAYS Performed at [facility], [address], [place], [place] [address]    Report Status PENDING  Incomplete  Resp Panel by RT-PCR (Flu A&B, Covid) Nasopharyngeal Swab     Status: None   Collection Time: 09/03/20  3:36 PM   Specimen: Nasopharyngeal Swab; Nasopharyngeal(NP) swabs in vial transport medium  Result Value Ref Range Status   SARS Coronavirus 2 by RT PCR NEGATIVE NEGATIVE Final    Comment: (NOTE) SARS-CoV-2 target nucleic acids are NOT DETECTED.  The SARS-CoV-2 RNA is generally detectable in upper respiratory specimens during the acute phase of infection. The lowest concentration of SARS-CoV-2 viral copies this assay can detect is 138 copies/mL. A negative result does not preclude SARS-Cov-2 infection and should not be used as the sole basis for treatment or other patient management decisions. A negative result may occur with  improper specimen collection/handling, submission of specimen other than nasopharyngeal swab, presence of viral mutation(s) within the areas targeted by this assay, and inadequate number of viral copies(<138 copies/mL). A negative result must be combined with clinical observations, patient history, and epidemiological information. The expected result is Negative.  Fact Sheet for Patients:  BloggerCourse.com  Fact Sheet for Healthcare Providers:  SeriousBroker.it  This test is no t yet approved or cleared by the [place] FDA and  has been authorized for detection and/or diagnosis of SARS-CoV-2 by FDA under an Emergency Use Authorization (EUA). This EUA will remain  in effect (meaning this test can be used) for  the duration of the COVID-19 declaration under Section 564(b)(1) of the Act, 21 U.S.C.section 360bbb-3(b)(1), unless the authorization is terminated  or revoked sooner.       Influenza A by PCR NEGATIVE NEGATIVE Final   Influenza B by PCR NEGATIVE NEGATIVE Final    Comment: (NOTE) The Xpert Xpress SARS-CoV-2/FLU/RSV plus assay is intended as an aid in the diagnosis of influenza from Nasopharyngeal swab specimens and should not be used as a sole basis for treatment. Nasal washings and aspirates are unacceptable for Xpert Xpress SARS-CoV-2/FLU/RSV testing.  Fact Sheet for Patients: BloggerCourse.com  Fact Sheet for Healthcare Providers: SeriousBroker.it  This test is not yet approved or cleared by the [place] FDA and has been authorized for detection and/or diagnosis of SARS-CoV-2 by FDA under an Emergency Use Authorization (EUA). This EUA will remain in effect (meaning this test can be used) for the duration of the COVID-19 declaration under Section 564(b)(1) of the Act, 21 U.S.C. section 360bbb-3(b)(1), unless the authorization is terminated or revoked.  Performed at [facility], [address], [place], [place] [address]   Blood Culture (routine x 2)     Status: None (Preliminary result)   Collection Time: 09/03/20  3:44 PM   Specimen: BLOOD LEFT HAND  Result Value Ref Range Status   Specimen Description BLOOD LEFT HAND  Final   Special Requests   Final    BOTTLES DRAWN AEROBIC AND ANAEROBIC Blood Culture adequate volume   Culture   Final    NO GROWTH 2 DAYS Performed at [facility], [address] [place], [place] [id number]    Report Status PENDING  Incomplete       Today   Subjective    Tarius Lorino today has no new complaints No fever  Or chills   No Nausea, Vomiting or Diarrhea No significant scrotal pain        Patient has been seen and examined prior to discharge   Objective   Blood  pressure (!) 154/86, pulse 79, temperature 97.9 F (36.6 C), temperature source Oral, resp. rate 18, height 5\' 8"  (1.727 m), weight 77.2 kg, SpO2 97 %.   Intake/Output Summary (Last 24 hours) at 09/05/2020 1339 Last data filed at 09/05/2020 0700 Gross per 24 hour  Intake 240 ml  Output 1200 ml  Net -960 ml    Exam Gen:- Awake Alert, no acute distress  HEENT:- [place].AT, No sclera icterus Neck-Supple Neck,No JVD,.  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   Total Discharge time is about 33 minutes  Shon Haleourage Melvena Vink M.D on 09/05/2020 at 1:39 PM  Go to [email or web address] -  for contact info  [facility] - Office  [phone]

## 2020-09-08 ENCOUNTER — Telehealth: Payer: Self-pay

## 2020-09-08 LAB — CULTURE, BLOOD (ROUTINE X 2)
Culture: NO GROWTH
Culture: NO GROWTH
Special Requests: ADEQUATE

## 2020-09-08 NOTE — Telephone Encounter (Signed)
Client returned call from earlier today. Client was recently discharged from [facility] with treatment for hyperglycemia and infection/sepsis.  Client reports he is weak,but doing okay. He is taking all his medications as prescribed and has all of them. He is doing his own dressing changes to his scrotal area and is "proud" of himself for being able to do this. Encouraged client to continue to change per instructions and to notify Dr. Ronne Binning or [facility] provider for worsening pain, or any changes such as swelling, temperature, chills or consistently high blood sugars.  Discussed that client missed his endocrine appointment with Dr. [name] last month. Discussed that he should call today to reschedule. He states he called when discharged over the weekend, but talked to a message service. Encouraged client to call today as no appointment is visible in Epic. Also, encouraged [facility] to decide if he should be seen sooner than next follow up.  Discussed following a diabetic diet and checking his blood sugars as ordered.   Client will call [facility] if needs any other assistance. Plan to follow up by phone this week to assure client has made follow up appointments.  [name] RN  [employer]

## 2020-09-08 NOTE — Telephone Encounter (Signed)
Called to follow up with client after discharge from hospital to discuss needed follow ups. No answer left voicemail requesting return call.   [name] RN [employer]

## 2020-09-17 ENCOUNTER — Ambulatory Visit (INDEPENDENT_AMBULATORY_CARE_PROVIDER_SITE_OTHER): Payer: Self-pay | Admitting: Urology

## 2020-09-17 ENCOUNTER — Other Ambulatory Visit: Payer: Self-pay

## 2020-09-17 ENCOUNTER — Encounter: Payer: Self-pay | Admitting: Urology

## 2020-09-17 VITALS — BP 127/75 | HR 97 | Temp 98.2°F | Wt 163.0 lb

## 2020-09-17 DIAGNOSIS — N492 Inflammatory disorders of scrotum: Secondary | ICD-10-CM

## 2020-09-17 MED ORDER — DOXYCYCLINE HYCLATE 100 MG PO CAPS: 100.0000 mg | ORAL_CAPSULE | Freq: Two times a day (BID) | ORAL | 0 refills | Status: DC

## 2020-09-17 NOTE — Progress Notes (Signed)
09/17/2020 9:30 AM   [name] [name] 1970-08-15 [id number]  Referring provider: [name], PA-C [address] [place],  [place] [address]  followup scrotal abscess  HPI: Mr [name] [name] is a 50yo here for followup after incision and drainage of a scrotal abscess. He has finished his antibiotics and he is not longer packing his incision. 3 days ago he developed no swelling behind the abscess area. Mild perineal pain. No drainage from area.   PMH: Past Medical History:  Diagnosis Date  . Anxiety   . Arthritis   . CAD (coronary artery disease)    Moderate LAD disease 2016 - [name] [name]  . Chronic bronchitis (HCC)   . Chronic upper back pain   . COPD (chronic obstructive pulmonary disease) (HCC)   . Depression   . Diabetic peripheral neuropathy (HCC)   . GERD (gastroesophageal reflux disease)   . History of gout   . Hyperlipemia   . Hypertension   . Migraine   . Noncompliance   . 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 Sleep apnea 2016   "could not afford CPAP".  . Type 2 diabetes mellitus (HCC)     Surgical History: Past Surgical History:  Procedure Laterality Date  . ANKLE SURGERY Right 1982   "had extra bones in there; took them out"  . APPENDECTOMY  1975  . BIOPSY  12/26/2018   Procedure: BIOPSY;  Surgeon: [name] Hippo, MD;  [facility];  Service: Endoscopy;;  duodenal biopsies  . BIOPSY  03/23/2019   Procedure: BIOPSY;  Surgeon: [name] Hippo, MD;  [facility];  Service: Endoscopy;;  esophagus  . CARDIAC CATHETERIZATION N/A 03/28/2015   Procedure: Left Heart Cath and Coronary Angiography;  Surgeon: [name] Decamp, MD;  Location: [facility] INVASIVE CV LAB;  Service: Cardiovascular;  Laterality: N/A;  . CARDIAC CATHETERIZATION N/A 03/28/2015   Procedure: Intravascular Pressure Wire/FFR Study;  Surgeon: [name] Decamp, MD;  Location: [facility] INVASIVE CV LAB;  Service: Cardiovascular;  Laterality: N/A;  . CARPAL TUNNEL RELEASE Left ~ 2008  . COLONOSCOPY WITH  ESOPHAGOGASTRODUODENOSCOPY (EGD)    . COLONOSCOPY WITH PROPOFOL N/A 12/24/2019   Procedure: COLONOSCOPY WITH PROPOFOL;  Surgeon: [name] Hippo, MD;  [facility];  Service: Endoscopy;  Laterality: N/A;  955  . ELBOW FRACTURE SURGERY Left ~ 2008  . ESOPHAGOGASTRODUODENOSCOPY N/A 12/26/2018   Procedure: ESOPHAGOGASTRODUODENOSCOPY (EGD);  Surgeon: [name] Hippo, MD;  [facility];  Service: Endoscopy;  Laterality: N/A;  . ESOPHAGOGASTRODUODENOSCOPY (EGD) WITH PROPOFOL N/A 03/23/2019   Procedure: ESOPHAGOGASTRODUODENOSCOPY (EGD) WITH PROPOFOL;  Surgeon: [name] Hippo, MD;  [facility];  Service: Endoscopy;  Laterality: N/A;  7:30  . FRACTURE SURGERY     ankle and elbow  . PILONIDAL CYST EXCISION N/A 03/24/2017   Procedure: EXCISION CHRONIC  PILONIDAL ABSCESS;  Surgeon: [name] Miyamoto, MD;  [facility];  Service: General;  Laterality: N/A;  . TENDON REPAIR Left ~ 2004   "main tendon in my ankle"    Home Medications:  Allergies as of 09/17/2020      Reactions   Omeprazole Magnesium Swelling   Face swells, no breathing impairment   Bee Venom    Esomeprazole Swelling   Face swells, no breathing impairment   Morphine And Related    Itching at IV site, dry heaving, burning sensation in throat.      Medication List       Accurate as of September 17, 2020  9:30 AM. If you have any questions, ask your nurse or doctor.        STOP taking these medications   pantoprazole 40 MG tablet Commonly known as: PROTONIX Stopped by: [name] Aye, MD     TAKE these medications   acetaminophen 325 MG tablet Commonly known as: TYLENOL Take 2 tablets (650 mg total) by mouth every 6 (six) hours as needed for moderate pain. What changed: how much to take   albuterol 108 (90 Base) MCG/ACT inhaler Commonly known as: Proventil HFA INHALE 2 PUFFS BY MOUTH EVERY 6 HOURS AS NEEDED FOR COUGHING, WHEEZING, OR SHORTNESS OF BREATH What changed:   how much to  take  how to take this  when to take this  reasons to take this  additional instructions   ARIPiprazole 5 MG tablet Commonly known as: ABILIFY Take 5 mg by mouth daily.   Aspirin Low Dose 81 MG EC tablet Generic drug: aspirin Take 81 mg by mouth every morning.   atorvastatin 80 MG tablet Commonly known as: LIPITOR TAKE 1 Tablet BY MOUTH ONCE EVERY DAY   doxycycline 100 MG capsule Commonly known as: VIBRAMYCIN Take 1 capsule (100 mg total) by mouth 2 (two) times daily. Started by: [name] Aye, MD   Elwin Sleight 100-5 MCG/ACT Aero Generic drug: mometasone-formoterol INHALE 2 PUFFS BY MOUTH TWICE DAILY. RINSE MOUTH AFTER EACH USE What changed: See the new instructions.   insulin aspart 100 UNIT/ML injection Commonly known as: NovoLOG Inject 14-20 Units into the skin 3 (three) times daily before meals.   Lantus SoloStar 100 UNIT/ML Solostar Pen Generic drug: insulin glargine Inject 60 Units into the skin at bedtime. What changed: how much to take   metFORMIN 500 MG tablet Commonly known as: GLUCOPHAGE Take 1 tablet (500 mg total) by mouth 2 (two) times daily with a meal.   metoCLOPramide 5 MG tablet Commonly known as: Reglan Take 1 tablet (5 mg total) by mouth 4 (four) times daily -  before meals and at bedtime.   nitroGLYCERIN 0.4 MG SL tablet Commonly known as: NITROSTAT Place 1 tablet (0.4 mg total) under the tongue every 5 (five) minutes as needed for chest pain.   promethazine 25 MG suppository Commonly known as: PHENERGAN Place 1 suppository (25 mg total) rectally every 8 (eight) hours as needed for refractory nausea / vomiting.   sertraline 100 MG tablet Commonly known as: ZOLOFT Take 1 tablet by mouth daily.       Allergies:  Allergies  Allergen Reactions  . Omeprazole Magnesium Swelling    Face swells, no breathing impairment  . Bee Venom   . Esomeprazole Swelling    Face swells, no breathing impairment  . Morphine And Related     Itching  at IV site, dry heaving, burning sensation in throat.    Family History: Family History  Problem Relation Age of Onset  . Congestive Heart Failure Sister   . Diabetes Mother   . Hypertension Mother   . Cancer Mother   . Diabetes Father   . Hypertension Father   . Hyperlipidemia Father     Social History:  reports that he has been smoking cigarettes. He has a 17.00 pack-year smoking history. He has never used smokeless tobacco. He reports that he does not drink alcohol and does not use drugs.  ROS: All other review of systems were reviewed and are negative except what is noted above in HPI  Physical Exam: BP 127/75   Pulse 97   Temp  98.2 F (36.8 C)   Wt 163 lb (73.9 kg)   BMI 24.78 kg/m   Constitutional:  Alert and oriented, No acute distress. HEENT: [place] AT, moist mucus membranes.  Trachea midline, no masses. Cardiovascular: No clubbing, cyanosis, or edema. Respiratory: Normal respiratory effort, no increased work of breathing. GI: Abdomen is soft, nontender, nondistended, no abdominal masses GU: No CVA tenderness. 1cm erythematous area on the posterior scrotum concerning for cellulitis/developing abscess  Lymph: No cervical or inguinal lymphadenopathy. Skin: No rashes, bruises or suspicious lesions. Neurologic: Grossly intact, no focal deficits, moving all 4 extremities. Psychiatric: Normal mood and affect.  Laboratory Data: Lab Results  Component Value Date   WBC 11.7 (H) 09/04/2020   HGB 13.2 09/04/2020   HCT 39.7 09/04/2020   MCV 93.9 09/04/2020   PLT 258 09/04/2020    Lab Results  Component Value Date   CREATININE 1.15 09/04/2020    No results found for: PSA  No results found for: TESTOSTERONE  Lab Results  Component Value Date   HGBA1C >15.5 (H) 09/03/2020    Urinalysis    Component Value Date/Time   COLORURINE YELLOW 12/23/2018 2330   APPEARANCEUR HAZY (A) 12/23/2018 2330   LABSPEC 1.015 12/23/2018 2330   PHURINE 5.0 12/23/2018 2330    GLUCOSEU NEGATIVE 12/23/2018 2330   HGBUR NEGATIVE 12/23/2018 2330   BILIRUBINUR negative 09/03/2020 1148   KETONESUR 20 (A) 12/23/2018 2330   PROTEINUR Negative 09/03/2020 1148   PROTEINUR 30 (A) 12/23/2018 2330   UROBILINOGEN negative (A) 09/03/2020 1148   NITRITE negative 09/03/2020 1148   NITRITE NEGATIVE 12/23/2018 2330   LEUKOCYTESUR Negative 09/03/2020 1148   LEUKOCYTESUR NEGATIVE 12/23/2018 2330    Lab Results  Component Value Date   BACTERIA NONE SEEN 12/23/2018    Pertinent Imaging:  No results found for this or any previous visit.  No results found for this or any previous visit.  No results found for this or any previous visit.  No results found for this or any previous visit.  No results found for this or any previous visit.  No results found for this or any previous visit.  No results found for this or any previous visit.  No results found for this or any previous visit.   Assessment & Plan:    1. Scrotal abscess -doxycycline 100mg  BID for 7 days -RCT 2 weeks    No follow-ups on file.  , MD  [facility] [place]

## 2020-09-17 NOTE — Progress Notes (Signed)
Urological Symptom Review  Patient is experiencing the following symptoms: Frequent urination Get up at night to urinate Stream starts and stops Weak stream Erection problems (male only)   Review of Systems  Gastrointestinal (upper)  : Nausea Vomiting Indigestion/heartburn  Gastrointestinal (lower) : Diarrhea Constipation  Constitutional : Negative for symptoms  Skin: Negative for skin symptoms  Eyes: Blurred vision  Ear/Nose/Throat : Negative for Ear/Nose/Throat symptoms  Hematologic/Lymphatic: Negative for Hematologic/Lymphatic symptoms  Cardiovascular : Leg swelling Chest pain  Respiratory : Cough Shortness of breath  Endocrine: Excessive thirst  Musculoskeletal: Back pain Joint pain  Neurological: Headaches Dizziness  Psychologic: Depression Anxiety

## 2020-09-19 ENCOUNTER — Telehealth: Payer: Self-pay

## 2020-09-19 NOTE — Telephone Encounter (Signed)
Called to follow up with client and to remind client to schedule an appointment with Dr. [name] Endocrinologist. No answer, left voicemail.   [name] RN [employer]

## 2020-09-22 ENCOUNTER — Other Ambulatory Visit: Payer: Self-pay | Admitting: Physician Assistant

## 2020-09-26 ENCOUNTER — Encounter: Payer: Self-pay | Admitting: Urology

## 2020-09-26 NOTE — Patient Instructions (Signed)

## 2020-10-03 ENCOUNTER — Ambulatory Visit (INDEPENDENT_AMBULATORY_CARE_PROVIDER_SITE_OTHER): Payer: Self-pay | Admitting: Urology

## 2020-10-03 ENCOUNTER — Other Ambulatory Visit: Payer: Self-pay

## 2020-10-03 ENCOUNTER — Telehealth: Payer: Self-pay

## 2020-10-03 VITALS — BP 79/51 | HR 120 | Temp 98.9°F | Ht 68.0 in | Wt 160.0 lb

## 2020-10-03 DIAGNOSIS — N492 Inflammatory disorders of scrotum: Secondary | ICD-10-CM

## 2020-10-03 MED ORDER — DOXYCYCLINE HYCLATE 100 MG PO CAPS: 100.0000 mg | ORAL_CAPSULE | Freq: Two times a day (BID) | ORAL | 0 refills | Status: DC

## 2020-10-03 NOTE — Telephone Encounter (Signed)
Called client for follow up and to encourage client to reschedule with Endocrinology. No answer. Left voicemail requesting return call.   [name] RN [employer]

## 2020-10-03 NOTE — Progress Notes (Signed)
10/03/2020 11:04 AM   Nolon L Etherington 19-Feb-1971 [id number]  Referring provider: [name], PA-C [address] [place],  [place] [address]  followup scrotal abscess  HPI: Mr Dohrmann is a 50yo here for followup for a scrotal abscess. He finished his course of doxycycline. He denies any worsening swelling and no drainage from the scrotum. He denies any pain. No other complaints today   PMH: Past Medical History:  Diagnosis Date  . Anxiety   . Arthritis   . CAD (coronary artery disease)    Moderate LAD disease 2016 - Dr. Jacinto Halim  . Chronic bronchitis (HCC)   . Chronic upper back pain   . COPD (chronic obstructive pulmonary disease) (HCC)   . Depression   . Diabetic peripheral neuropathy (HCC)   . GERD (gastroesophageal reflux disease)   . History of gout   . Hyperlipemia   . Hypertension   . Migraine   . Noncompliance   . 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 Sleep apnea 2016   "could not afford CPAP".  . Type 2 diabetes mellitus (HCC)     Surgical History: Past Surgical History:  Procedure Laterality Date  . ANKLE SURGERY Right 1982   "had extra bones in there; took them out"  . APPENDECTOMY  1975  . BIOPSY  12/26/2018   Procedure: BIOPSY;  Surgeon: Malissa Hippo, MD;  [facility];  Service: Endoscopy;;  duodenal biopsies  . BIOPSY  03/23/2019   Procedure: BIOPSY;  Surgeon: Malissa Hippo, MD;  [facility];  Service: Endoscopy;;  esophagus  . CARDIAC CATHETERIZATION N/A 03/28/2015   Procedure: Left Heart Cath and Coronary Angiography;  Surgeon: Yates Decamp, MD;  Location: [facility] INVASIVE CV LAB;  Service: Cardiovascular;  Laterality: N/A;  . CARDIAC CATHETERIZATION N/A 03/28/2015   Procedure: Intravascular Pressure Wire/FFR Study;  Surgeon: Yates Decamp, MD;  Location: [facility] INVASIVE CV LAB;  Service: Cardiovascular;  Laterality: N/A;  . CARPAL TUNNEL RELEASE Left ~ 2008  . COLONOSCOPY WITH ESOPHAGOGASTRODUODENOSCOPY (EGD)    . COLONOSCOPY WITH  PROPOFOL N/A 12/24/2019   Procedure: COLONOSCOPY WITH PROPOFOL;  Surgeon: Malissa Hippo, MD;  [facility];  Service: Endoscopy;  Laterality: N/A;  955  . ELBOW FRACTURE SURGERY Left ~ 2008  . ESOPHAGOGASTRODUODENOSCOPY N/A 12/26/2018   Procedure: ESOPHAGOGASTRODUODENOSCOPY (EGD);  Surgeon: Malissa Hippo, MD;  [facility];  Service: Endoscopy;  Laterality: N/A;  . ESOPHAGOGASTRODUODENOSCOPY (EGD) WITH PROPOFOL N/A 03/23/2019   Procedure: ESOPHAGOGASTRODUODENOSCOPY (EGD) WITH PROPOFOL;  Surgeon: Malissa Hippo, MD;  [facility];  Service: Endoscopy;  Laterality: N/A;  7:30  . FRACTURE SURGERY     ankle and elbow  . PILONIDAL CYST EXCISION N/A 03/24/2017   Procedure: EXCISION CHRONIC  PILONIDAL ABSCESS;  Surgeon: [name] Miyamoto, MD;  [facility];  Service: General;  Laterality: N/A;  . TENDON REPAIR Left ~ 2004   "main tendon in my ankle"    Home Medications:  Allergies as of 10/03/2020      Reactions   Omeprazole Magnesium Swelling   Face swells, no breathing impairment   Bee Venom    Esomeprazole Swelling   Face swells, no breathing impairment   Morphine And Related    Itching at IV site, dry heaving, burning sensation in throat.      Medication List       Accurate as of Oct 03, 2020 11:04 AM. If you have any questions, ask your nurse  or doctor.        acetaminophen 325 MG tablet Commonly known as: TYLENOL Take 2 tablets (650 mg total) by mouth every 6 (six) hours as needed for moderate pain. What changed: how much to take   albuterol 108 (90 Base) MCG/ACT inhaler Commonly known as: Proventil HFA INHALE 2 PUFFS BY MOUTH EVERY 6 HOURS AS NEEDED FOR COUGHING, WHEEZING, OR SHORTNESS OF BREATH   ARIPiprazole 5 MG tablet Commonly known as: ABILIFY Take 5 mg by mouth daily.   Aspirin Low Dose 81 MG EC tablet Generic drug: aspirin Take 81 mg by mouth every morning.   atorvastatin 80 MG tablet Commonly known as: LIPITOR TAKE 1  Tablet BY MOUTH ONCE EVERY DAY   doxycycline 100 MG capsule Commonly known as: VIBRAMYCIN Take 1 capsule (100 mg total) by mouth 2 (two) times daily.   Dulera 100-5 MCG/ACT Aero Generic drug: mometasone-formoterol INHALE 2 PUFFS BY MOUTH TWICE DAILY. RINSE MOUTH AFTER EACH USE What changed: See the new instructions.   insulin aspart 100 UNIT/ML injection Commonly known as: NovoLOG Inject 14-20 Units into the skin 3 (three) times daily before meals.   Lantus SoloStar 100 UNIT/ML Solostar Pen Generic drug: insulin glargine Inject 60 Units into the skin at bedtime. What changed: how much to take   metFORMIN 500 MG tablet Commonly known as: GLUCOPHAGE Take 1 tablet (500 mg total) by mouth 2 (two) times daily with a meal.   metoCLOPramide 5 MG tablet Commonly known as: Reglan Take 1 tablet (5 mg total) by mouth 4 (four) times daily -  before meals and at bedtime.   nitroGLYCERIN 0.4 MG SL tablet Commonly known as: NITROSTAT Place 1 tablet (0.4 mg total) under the tongue every 5 (five) minutes as needed for chest pain.   promethazine 25 MG suppository Commonly known as: PHENERGAN Place 1 suppository (25 mg total) rectally every 8 (eight) hours as needed for refractory nausea / vomiting.   sertraline 100 MG tablet Commonly known as: ZOLOFT Take 1 tablet by mouth daily.       Allergies:  Allergies  Allergen Reactions  . Omeprazole Magnesium Swelling    Face swells, no breathing impairment  . Bee Venom   . Esomeprazole Swelling    Face swells, no breathing impairment  . Morphine And Related     Itching at IV site, dry heaving, burning sensation in throat.    Family History: Family History  Problem Relation Age of Onset  . Congestive Heart Failure Sister   . Diabetes Mother   . Hypertension Mother   . Cancer Mother   . Diabetes Father   . Hypertension Father   . Hyperlipidemia Father     Social History:  reports that he has been smoking cigarettes. He has a  17.00 pack-year smoking history. He has never used smokeless tobacco. He reports that he does not drink alcohol and does not use drugs.  ROS: All other review of systems were reviewed and are negative except what is noted above in HPI  Physical Exam: BP (!) 79/51   Pulse (!) 120   Temp 98.9 F (37.2 C)   Ht 5\' 8"  (1.727 m)   Wt 160 lb (72.6 kg)   BMI 24.33 kg/m   Constitutional:  Alert and oriented, No acute distress. HEENT: [place] AT, moist mucus membranes.  Trachea midline, no masses. Cardiovascular: No clubbing, cyanosis, or edema. Respiratory: Normal respiratory effort, no increased work of breathing. GI: Abdomen is soft, nontender, nondistended, no abdominal  masses GU: No CVA tenderness. 2cm posterior scrotum sebaceous cyst. No erythema or induration Lymph: No cervical or inguinal lymphadenopathy. Skin: No rashes, bruises or suspicious lesions. Neurologic: Grossly intact, no focal deficits, moving all 4 extremities. Psychiatric: Normal mood and affect.  Laboratory Data: Lab Results  Component Value Date   WBC 11.7 (H) 09/04/2020   HGB 13.2 09/04/2020   HCT 39.7 09/04/2020   MCV 93.9 09/04/2020   PLT 258 09/04/2020    Lab Results  Component Value Date   CREATININE 1.15 09/04/2020    No results found for: PSA  No results found for: TESTOSTERONE  Lab Results  Component Value Date   HGBA1C >15.5 (H) 09/03/2020    Urinalysis    Component Value Date/Time   COLORURINE YELLOW 12/23/2018 2330   APPEARANCEUR HAZY (A) 12/23/2018 2330   LABSPEC 1.015 12/23/2018 2330   PHURINE 5.0 12/23/2018 2330   GLUCOSEU NEGATIVE 12/23/2018 2330   HGBUR NEGATIVE 12/23/2018 2330   BILIRUBINUR negative 09/03/2020 1148   KETONESUR 20 (A) 12/23/2018 2330   PROTEINUR Negative 09/03/2020 1148   PROTEINUR 30 (A) 12/23/2018 2330   UROBILINOGEN negative (A) 09/03/2020 1148   NITRITE negative 09/03/2020 1148   NITRITE NEGATIVE 12/23/2018 2330   LEUKOCYTESUR Negative 09/03/2020 1148    LEUKOCYTESUR NEGATIVE 12/23/2018 2330    Lab Results  Component Value Date   BACTERIA NONE SEEN 12/23/2018    Pertinent Imaging:  No results found for this or any previous visit.  No results found for this or any previous visit.  No results found for this or any previous visit.  No results found for this or any previous visit.  No results found for this or any previous visit.  No results found for this or any previous visit.  No results found for this or any previous visit.  No results found for this or any previous visit.   Assessment & Plan:    1. Scrotal abscess -We discussed excision of the sebaceous cyst which is likely the cause of the recurrent abscess. After discussing the surgical options the patient wishes to proceed with surgery. Risks/benefits/alternatives discussed - Urinalysis, Routine w reflex microscopic   No follow-ups on file.  Wilkie Aye, MD  [facility] [place]

## 2020-10-03 NOTE — Progress Notes (Signed)
Urological Symptom Review  Patient is experiencing the following symptoms: Frequent urination Get up at night to urinate Weak stream   Review of Systems  Gastrointestinal (upper)  : Nausea Vomiting Indigestion/heartburn  Gastrointestinal (lower) : Constipation  Constitutional : Negative for symptoms  Skin: Negative for skin symptoms  Eyes: Blurred vision  Ear/Nose/Throat : Negative for Ear/Nose/Throat symptoms  Hematologic/Lymphatic: Negative for Hematologic/Lymphatic symptoms  Cardiovascular : Chest pain  Respiratory : Shortness of breath  Endocrine: Excessive thirst  Musculoskeletal: Back pain Joint pain  Neurological: Headache dizziness  Psychologic: Depression Anxiety

## 2020-10-04 LAB — URINALYSIS, ROUTINE W REFLEX MICROSCOPIC
Bilirubin, UA: NEGATIVE
Ketones, UA: NEGATIVE
Leukocytes,UA: NEGATIVE
Nitrite, UA: NEGATIVE
Specific Gravity, UA: 1.01 (ref 1.005–1.030)
Urobilinogen, Ur: 0.2 mg/dL (ref 0.2–1.0)
pH, UA: 5.5 (ref 5.0–7.5)

## 2020-10-04 LAB — MICROSCOPIC EXAMINATION
Bacteria, UA: NONE SEEN
WBC, UA: NONE SEEN /hpf (ref 0–5)

## 2020-10-05 ENCOUNTER — Other Ambulatory Visit: Payer: Self-pay | Admitting: Urology

## 2020-10-06 ENCOUNTER — Telehealth: Payer: Self-pay | Admitting: Urology

## 2020-10-06 NOTE — Telephone Encounter (Signed)
PT called and needs his antibiotic sent in to [facility] on [address] in [place].

## 2020-10-07 ENCOUNTER — Other Ambulatory Visit: Payer: Self-pay

## 2020-10-07 ENCOUNTER — Encounter: Payer: Self-pay | Admitting: Urology

## 2020-10-07 DIAGNOSIS — N492 Inflammatory disorders of scrotum: Secondary | ICD-10-CM

## 2020-10-07 MED ORDER — DOXYCYCLINE HYCLATE 100 MG PO CAPS: 100.0000 mg | ORAL_CAPSULE | Freq: Two times a day (BID) | ORAL | 0 refills | Status: DC

## 2020-10-07 NOTE — Progress Notes (Signed)
Pharmacy change

## 2020-10-07 NOTE — Patient Instructions (Signed)
Epidermoid Cyst  An epidermoid cyst, also called an epidermal cyst, is a small lump under your skin. The cyst contains a substance called keratin. Do not try to pop or open the cyst yourself. What are the causes?  A blocked hair follicle.  A hair that curls and re-enters the skin instead of growing straight out of the skin.  A blocked pore.  Irritated skin.  An injury to the skin.  Certain conditions that are passed along from parent to child.  Human papillomavirus (HPV). This happens rarely when cysts occur on the bottom of the feet.  Long-term sun damage to the skin. What increases the risk?  Having acne.  Being male.  Having an injury to the skin.  Being past puberty.  Having certain conditions caused by genes (genetic disorder) What are the signs or symptoms? These cysts are usually harmless, but they can get infected. Symptoms of infection may include:  Redness.  Inflammation.  Tenderness.  Warmth.  Fever.  A bad-smelling substance that drains from the cyst.  Pus that drains from the cyst. How is this treated? In many cases, epidermoid cysts go away on their own without treatment. If a cyst becomes infected, treatment may include:  Opening and draining the cyst, done by a doctor. After draining, you may need minor surgery to remove the rest of the cyst.  Antibiotic medicine.  Shots of medicines (steroids) that help to reduce inflammation.  Surgery to remove the cyst. Surgery may be done if the cyst: ? Becomes large. ? Bothers you. ? Has a chance of turning into cancer.  Do not try to open a cyst yourself. Follow these instructions at home: Medicines  Take over-the-counter and prescription medicines as told by your doctor.  If you were prescribed an antibiotic medicine, take it as told by your doctor. Do not stop taking it even if you start to feel better. General instructions  Keep the area around your cyst clean and dry.  Wear loose, dry  clothing.  Avoid touching your cyst.  Check your cyst every day for signs of infection. Check for: ? Redness, swelling, or pain. ? Fluid or blood. ? Warmth. ? Pus or a bad smell.  Keep all follow-up visits. How is this prevented?  Wear clean, dry, clothing.  Avoid wearing tight clothing.  Keep your skin clean and dry. Take showers or baths every day. Contact a doctor if:  Your cyst has symptoms of infection.  Your condition does not improve or gets worse.  You have a cyst that looks different from other cysts you have had.  You have a fever. Get help right away if:  Redness spreads from the cyst into the area close by. Summary  An epidermoid cyst is a small lump under your skin.  If a cyst becomes infected, treatment may include surgery to open and drain the cyst, or to remove it.  Take over-the-counter and prescription medicines only as told by your doctor.  Contact a doctor if your condition is not improving or is getting worse.  Keep all follow-up visits. This information is not intended to replace advice given to you by your health care provider. Make sure you discuss any questions you have with your health care provider. Document Revised: 08/22/2019 Document Reviewed: 08/22/2019 Elsevier Patient Education  2021 [employer]  

## 2020-10-07 NOTE — Telephone Encounter (Signed)
Rx sent. Patient called with no answer. My Chart message sent.

## 2020-10-16 ENCOUNTER — Other Ambulatory Visit: Payer: Self-pay | Admitting: Physician Assistant

## 2020-10-16 ENCOUNTER — Encounter (INDEPENDENT_AMBULATORY_CARE_PROVIDER_SITE_OTHER): Payer: Self-pay | Admitting: Gastroenterology

## 2020-10-16 ENCOUNTER — Ambulatory Visit (INDEPENDENT_AMBULATORY_CARE_PROVIDER_SITE_OTHER): Payer: Self-pay | Admitting: Gastroenterology

## 2020-10-16 ENCOUNTER — Other Ambulatory Visit: Payer: Self-pay

## 2020-10-16 VITALS — BP 101/68 | HR 116 | Temp 99.1°F | Ht 68.0 in | Wt 160.5 lb

## 2020-10-16 DIAGNOSIS — K3184 Gastroparesis: Secondary | ICD-10-CM

## 2020-10-16 DIAGNOSIS — E1143 Type 2 diabetes mellitus with diabetic autonomic (poly)neuropathy: Secondary | ICD-10-CM

## 2020-10-16 DIAGNOSIS — K219 Gastro-esophageal reflux disease without esophagitis: Secondary | ICD-10-CM

## 2020-10-16 DIAGNOSIS — K2270 Barrett's esophagus without dysplasia: Secondary | ICD-10-CM

## 2020-10-16 DIAGNOSIS — E1165 Type 2 diabetes mellitus with hyperglycemia: Secondary | ICD-10-CM

## 2020-10-16 MED ORDER — FAMOTIDINE 40 MG PO TABS
40.0000 mg | ORAL_TABLET | Freq: Two times a day (BID) | ORAL | 3 refills | Status: DC
Start: 2020-10-16 — End: 2021-05-26

## 2020-10-16 NOTE — Progress Notes (Addendum)
[name] [name], M.D. Gastroenterology & Hepatology [facility] [address] [place], [place] [address]  Primary Care Physician: [name] Hawking, PA-C [address] [place] [address]  I will communicate my assessment and recommendations to the referring MD via EMR.  Problems: 1. GERD 2. Barrett's esophagus without dysplasia  History of Present Illness: [name] [name] is a 50 y.o. male with past medical history of GERD, Barrett's esophagus without dysplasia, coronary artery disease, COPD, depression, diabetes, hypertension, hyperlipidemia, who presents for follow up of GERD.  The patient was last seen on 11/22/2019. At that time, the patient was considered to have some degree of gastroparesis due to uncontrolled diabetes, which was being managed with Reglan and PPI.  Unfortunately, patient reports that he presented severe allergic reaction to PPIs including Nexium and omeprazole which cause shortness of breath, itching and facial swelling.  Due to this, he has been taking famotidine.  Patient reports that for the last 3 years he has presented recurrent episodes of heartrburn and "acid regurgitation" in his throat on a daily basis multiple times a day. He reports that he has presented significant symptom especially at night. His symptoms are worse when he eats spicy foods. He is currently taking Pepcid AC as he does not have any prescriptions for GERD. States that he felt the PPIs improved his symptoms in the past but he could not tolerate it.Patient states he has to drink significant amount of liquid sometimes so the food can go down but this is not frequent.  Denies any odynophagia.  The patient denies having any nausea, vomiting, fever, chills, hematochezia, melena, hematemesis, abdominal distention, abdominal pain, diarrhea, jaundice, pruritus or weight loss.  Last EGD: 03/2019  - Esophageal mucosal changes consistent with  short-segment Barrett's esophagus. Biopsied. - Z-line irregular, 37 cm from the incisors. - 2 cm hiatal hernia. - Portal hypertensive gastropathy. - Normal duodenal bulb and second portion of the duodenum. Last Colonoscopy:2021 - Decreased sphincter tone found on digital rectal exam. - The entire examined colon is normal.  Past Medical History: Past Medical History:  Diagnosis Date  . Anxiety   . Arthritis   . CAD (coronary artery disease)    Moderate LAD disease 2016 - Dr. Jacinto Halim  . Chronic bronchitis (HCC)   . Chronic upper back pain   . COPD (chronic obstructive pulmonary disease) (HCC)   . Depression   . Diabetic peripheral neuropathy (HCC)   . GERD (gastroesophageal reflux disease)   . History of gout   . Hyperlipemia   . Hypertension   . Migraine   . Noncompliance   . 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 Sleep apnea 2016   "could not afford CPAP".  . Type 2 diabetes mellitus (HCC)     Past Surgical History: Past Surgical History:  Procedure Laterality Date  . ANKLE SURGERY Right 1982   "had extra bones in there; took them out"  . APPENDECTOMY  1975  . BIOPSY  12/26/2018   Procedure: BIOPSY;  Surgeon: Malissa Hippo, MD;  Location: [facility];  Service: Endoscopy;;  duodenal biopsies  . BIOPSY  03/23/2019   Procedure: BIOPSY;  Surgeon: Malissa Hippo, MD;  Location: [facility];  Service: Endoscopy;;  esophagus  . CARDIAC CATHETERIZATION N/A 03/28/2015   Procedure: Left Heart Cath and Coronary Angiography;  Surgeon: Yates Decamp, MD;  Location: [facility] INVASIVE CV LAB;  Service: Cardiovascular;  Laterality: N/A;  . CARDIAC CATHETERIZATION N/A 03/28/2015  Procedure: Intravascular Pressure Wire/FFR Study;  Surgeon: Yates DecampJay Ganji, MD;  [facility] CV LAB;  Service: Cardiovascular;  Laterality: N/A;  . CARPAL TUNNEL RELEASE Left ~ 2008  . COLONOSCOPY WITH ESOPHAGOGASTRODUODENOSCOPY (EGD)    . COLONOSCOPY WITH PROPOFOL N/A 12/24/2019   Procedure: COLONOSCOPY WITH  PROPOFOL;  Surgeon: Malissa Hippoehman, Najeeb U, MD;  Location: [facility];  Service: Endoscopy;  Laterality: N/A;  955  . ELBOW FRACTURE SURGERY Left ~ 2008  . ESOPHAGOGASTRODUODENOSCOPY N/A 12/26/2018   Procedure: ESOPHAGOGASTRODUODENOSCOPY (EGD);  Surgeon: Malissa Hippoehman, Najeeb U, MD;  [facility];  Service: Endoscopy;  Laterality: N/A;  . ESOPHAGOGASTRODUODENOSCOPY (EGD) WITH PROPOFOL N/A 03/23/2019   Procedure: ESOPHAGOGASTRODUODENOSCOPY (EGD) WITH PROPOFOL;  Surgeon: Malissa Hippoehman, Najeeb U, MD;  Location: [facility];  Service: Endoscopy;  Laterality: N/A;  7:30  . FRACTURE SURGERY     ankle and elbow  . PILONIDAL CYST EXCISION N/A 03/24/2017   Procedure: EXCISION CHRONIC  PILONIDAL ABSCESS;  Surgeon: [name] MiyamotoBlackman, Douglas, MD;  Location: [facility];  Service: General;  Laterality: N/A;  . TENDON REPAIR Left ~ 2004   "main tendon in my ankle"    Family History: Family History  Problem Relation Age of Onset  . Congestive Heart Failure Sister   . Diabetes Mother   . Hypertension Mother   . Cancer Mother   . Diabetes Father   . Hypertension Father   . Hyperlipidemia Father     Social History: Social History   Tobacco Use  Smoking Status Current Every Day Smoker  . Packs/day: 0.50  . Years: 34.00  . Pack years: 17.00  . Types: Cigarettes  Smokeless Tobacco Never Used  Tobacco Comment   1/2 pack a day   Social History   Substance and Sexual Activity  Alcohol Use Never  . Alcohol/week: 0.0 standard drinks   Social History   Substance and Sexual Activity  Drug Use Never    Allergies: Allergies  Allergen Reactions  . Omeprazole Magnesium Swelling    Face swells, no breathing impairment  . Bee Venom   . Esomeprazole Swelling    Face swells, no breathing impairment  . Morphine And Related     Itching at IV site, dry heaving, burning sensation in throat.    Medications: Current Outpatient Medications  Medication Sig Dispense Refill  . albuterol (PROVENTIL HFA) 108 (90  Base) MCG/ACT inhaler INHALE 2 PUFFS BY MOUTH EVERY 6 HOURS AS NEEDED FOR COUGHING, WHEEZING, OR SHORTNESS OF BREATH 20.1 g 0  . ARIPiprazole (ABILIFY) 5 MG tablet Take 5 mg by mouth daily.    . ASPIRIN LOW DOSE 81 MG EC tablet Take 81 mg by mouth every morning.    Marland Kitchen. atorvastatin (LIPITOR) 80 MG tablet TAKE 1 Tablet BY MOUTH ONCE EVERY DAY 90 tablet 0  . doxycycline (VIBRAMYCIN) 100 MG capsule TAKE 1 CAPSULE BY MOUTH TWICE A DAY 28 capsule 0  . doxycycline (VIBRAMYCIN) 100 MG capsule Take 1 capsule (100 mg total) by mouth 2 (two) times daily. 28 capsule 0  . DULERA 100-5 MCG/ACT AERO INHALE 2 PUFFS BY MOUTH TWICE DAILY. RINSE MOUTH AFTER EACH USE (Patient taking differently: Inhale 2 puffs into the lungs 2 (two) times daily.) 13 g 0  . insulin aspart (NOVOLOG) 100 UNIT/ML injection Inject 14-20 Units into the skin 3 (three) times daily before meals. 15 mL 2  . insulin glargine (LANTUS SOLOSTAR) 100 UNIT/ML Solostar Pen Inject 60 Units into the skin at bedtime. (Patient taking differently: Inject 65 Units  into the skin at bedtime.) 15 mL   . metFORMIN (GLUCOPHAGE) 500 MG tablet Take 1 tablet (500 mg total) by mouth 2 (two) times daily with a meal. 60 tablet 1  . metoCLOPramide (REGLAN) 5 MG tablet Take 1 tablet (5 mg total) by mouth 4 (four) times daily -  before meals and at bedtime. 120 tablet 1  . nitroGLYCERIN (NITROSTAT) 0.4 MG SL tablet Place 1 tablet (0.4 mg total) under the tongue every 5 (five) minutes as needed for chest pain. 25 tablet 3  . promethazine (PHENERGAN) 25 MG suppository Place 1 suppository (25 mg total) rectally every 8 (eight) hours as needed for refractory nausea / vomiting. 12 each 0  . sertraline (ZOLOFT) 100 MG tablet Take 1 tablet by mouth daily.    Marland Kitchen acetaminophen (TYLENOL) 325 MG tablet Take 2 tablets (650 mg total) by mouth every 6 (six) hours as needed for moderate pain. (Patient not taking: Reported on 10/16/2020)     No current facility-administered medications for  this visit.    Review of Systems: GENERAL: negative for malaise, night sweats HEENT: No changes in hearing or vision, no nose bleeds or other nasal problems. NECK: Negative for lumps, goiter, pain and significant neck swelling RESPIRATORY: Negative for cough, wheezing CARDIOVASCULAR: Negative for chest pain, leg swelling, palpitations, orthopnea GI: SEE HPI MUSCULOSKELETAL: Negative for joint pain or swelling, back pain, and muscle pain. SKIN: Negative for lesions, rash PSYCH: Negative for sleep disturbance, mood disorder and recent psychosocial stressors. HEMATOLOGY Negative for prolonged bleeding, bruising easily, and swollen nodes. ENDOCRINE: Negative for cold or heat intolerance, polyuria, polydipsia and goiter. NEURO: negative for tremor, gait imbalance, syncope and seizures. The remainder of the review of systems is noncontributory.   Physical Exam: BP 101/68 (BP Location: Left Arm, Patient Position: Sitting, Cuff Size: Normal)   Pulse (!) 116   Temp 99.1 F (37.3 C) (Oral)   Ht 5\' 8"  (1.727 m)   Wt 160 lb 8 oz (72.8 kg)   BMI 24.40 kg/m  GENERAL: The patient is AO x3, in no acute distress. HEENT: Head is normocephalic and atraumatic. EOMI are intact. Mouth is well hydrated and without lesions. NECK: Supple. No masses LUNGS: Clear to auscultation. No presence of rhonchi/wheezing/rales. Adequate chest expansion HEART: RRR, normal s1 and s2. ABDOMEN: Soft, nontender, no guarding, no peritoneal signs, and nondistended. BS +. No masses. EXTREMITIES: Without any cyanosis, clubbing, rash, lesions or edema. NEUROLOGIC: AOx3, no focal motor deficit. SKIN: no jaundice, no rashes  Imaging/Labs: as above  I personally reviewed and interpreted the available labs, imaging and endoscopic files.  Impression and Plan: MARIANA GOYTIA is a 50 y.o. male with past medical history of GERD, Barrett's esophagus without dysplasia, coronary artery disease, COPD, depression, diabetes,  hypertension, hyperlipidemia, who presents for follow up of GERD.  Patient has presented persistent symptoms of reflux despite taking famotidine compliantly.  Since he has evidence of Barrett's esophagus, he should be esophagogastroduodenospy with taking a PPI as this may lead to better control of his gastric acid regurgitation and may help for prevention of progression of his Barrett's esophagus.  Unfortunately, he has presented significant allergies to PPIs in the past using this type of medication is not an option.  Had a lengthy discussion with the patient regarding the potential option for treatment with although modalities such as TIF versus Nissen fundoplication.  He states that he would like to get the input for possible TIF before considering a surgical approach.  I will  ask Dr. Barron Alvine about his candidacy to undergo this type of procedure.  If not an option, will refer him to a surgeon for surgical fundoplication.  For now, I advised him to take the famotidine twice a day to suppress his acid production as much as possible.  Patient understood and agreed. Will need repeat EGD in 3 years for BE surveillance.  -Increase famotidine to 40 mg twice a day -Will discuss case with Doristine Locks, DO for possible TIF vs referral for surgery -Will need repeat EGD in 3 years for BE surveillance.   All questions were answered.      Dolores Frame, MD Gastroenterology and Hepatology [facility]

## 2020-10-16 NOTE — Patient Instructions (Signed)
Increase famotidine to 40 mg twice a day Will discuss case with Doristine Locks, DO for possible TIF vs referral for surgery

## 2020-10-22 ENCOUNTER — Telehealth (INDEPENDENT_AMBULATORY_CARE_PROVIDER_SITE_OTHER): Payer: Self-pay | Admitting: Gastroenterology

## 2020-10-22 DIAGNOSIS — K3184 Gastroparesis: Secondary | ICD-10-CM

## 2020-10-22 NOTE — Telephone Encounter (Signed)
I called the patient to let him know our discussion he had with Dr. Barron Alvine regarding his candidacy for TIF.  However, the patient did not pick up the phone and I left a detailed voice message and instructions to call me back.  Based on the discussion I had with Dr. Barron Alvine, the patient may be contraindicated to have TIF as he has had clinical diagnosis of gastroparesis.  Underhand, Barrett's esophagus is not a contraindication for TIF. if this is not present then he may be an adequate candidate.  I would like to schedule a gastric emptying study after discussing with the patient if he is interested in pursuing this testing.  If not, we may refer him to a surgeon for surgical management of reflux.

## 2020-10-22 NOTE — Telephone Encounter (Signed)
The patient called back, I discussed about the importance of believe a gastric emptying study to confirm the diagnosis of gastroparesis.  The patient states that he has not felt ill improvement with the use of Reglan.  IWill schedule this test and I emphasized to the patient importance of avoiding intake of Reglan for a week before his test.  He will also need to avoid taking Phenergan.    Soledad Gerlach,  Can you please schedule a gastric emptying study? Dx: rule out gastroparesis  Thanks,  Katrinka Blazing, MD Gastroenterology and Hepatology [facility]

## 2020-10-30 NOTE — Telephone Encounter (Signed)
Thanks

## 2020-10-30 NOTE — Telephone Encounter (Signed)
[name] [name] is scheduled for 11/06/20 for GES and he is aware

## 2020-11-03 ENCOUNTER — Encounter: Payer: Self-pay | Admitting: Physician Assistant

## 2020-11-03 ENCOUNTER — Other Ambulatory Visit: Payer: Self-pay

## 2020-11-03 ENCOUNTER — Emergency Department (HOSPITAL_COMMUNITY): Payer: Self-pay

## 2020-11-03 ENCOUNTER — Other Ambulatory Visit (HOSPITAL_COMMUNITY)
Admission: RE | Admit: 2020-11-03 | Discharge: 2020-11-03 | Disposition: A | Discharge status: Home / Self Care | Payer: Self-pay | Source: Ambulatory Visit | Attending: Physician Assistant | Admitting: Physician Assistant

## 2020-11-03 ENCOUNTER — Emergency Department (HOSPITAL_COMMUNITY)
Admission: EM | Admit: 2020-11-03 | Discharge: 2020-11-03 | Disposition: A | Discharge status: Home / Self Care | Payer: Self-pay | Attending: Emergency Medicine | Admitting: Emergency Medicine

## 2020-11-03 ENCOUNTER — Ambulatory Visit: Payer: Self-pay | Admitting: Physician Assistant

## 2020-11-03 VITALS — BP 115/81 | HR 114 | Temp 97.7°F | Wt 159.0 lb

## 2020-11-03 DIAGNOSIS — Z794 Long term (current) use of insulin: Secondary | ICD-10-CM | POA: Insufficient documentation

## 2020-11-03 DIAGNOSIS — E1110 Type 2 diabetes mellitus with ketoacidosis without coma: Secondary | ICD-10-CM | POA: Insufficient documentation

## 2020-11-03 DIAGNOSIS — F17210 Nicotine dependence, cigarettes, uncomplicated: Secondary | ICD-10-CM | POA: Insufficient documentation

## 2020-11-03 DIAGNOSIS — R000 Tachycardia, unspecified: Secondary | ICD-10-CM | POA: Insufficient documentation

## 2020-11-03 DIAGNOSIS — I2510 Atherosclerotic heart disease of native coronary artery without angina pectoris: Secondary | ICD-10-CM | POA: Insufficient documentation

## 2020-11-03 DIAGNOSIS — E1165 Type 2 diabetes mellitus with hyperglycemia: Secondary | ICD-10-CM

## 2020-11-03 DIAGNOSIS — Z7982 Long term (current) use of aspirin: Secondary | ICD-10-CM | POA: Insufficient documentation

## 2020-11-03 DIAGNOSIS — E1140 Type 2 diabetes mellitus with diabetic neuropathy, unspecified: Secondary | ICD-10-CM | POA: Insufficient documentation

## 2020-11-03 DIAGNOSIS — Z91199 Patient's noncompliance with other medical treatment and regimen due to unspecified reason: Secondary | ICD-10-CM

## 2020-11-03 DIAGNOSIS — R509 Fever, unspecified: Secondary | ICD-10-CM | POA: Insufficient documentation

## 2020-11-03 DIAGNOSIS — Z7984 Long term (current) use of oral hypoglycemic drugs: Secondary | ICD-10-CM | POA: Insufficient documentation

## 2020-11-03 DIAGNOSIS — E785 Hyperlipidemia, unspecified: Secondary | ICD-10-CM

## 2020-11-03 DIAGNOSIS — J449 Chronic obstructive pulmonary disease, unspecified: Secondary | ICD-10-CM | POA: Insufficient documentation

## 2020-11-03 DIAGNOSIS — D72829 Elevated white blood cell count, unspecified: Secondary | ICD-10-CM | POA: Insufficient documentation

## 2020-11-03 DIAGNOSIS — I10 Essential (primary) hypertension: Secondary | ICD-10-CM | POA: Insufficient documentation

## 2020-11-03 DIAGNOSIS — R739 Hyperglycemia, unspecified: Secondary | ICD-10-CM

## 2020-11-03 DIAGNOSIS — Z9119 Patient's noncompliance with other medical treatment and regimen: Secondary | ICD-10-CM

## 2020-11-03 LAB — URINALYSIS, ROUTINE W REFLEX MICROSCOPIC
Bacteria, UA: NONE SEEN
Bilirubin Urine: NEGATIVE
Glucose, UA: >=500 mg/dL — AB
Ketones, ur: NEGATIVE mg/dL
Leukocytes,Ua: NEGATIVE
Nitrite: NEGATIVE
Protein, ur: NEGATIVE mg/dL
Specific Gravity, Urine: 1.024 (ref 1.005–1.030)
pH: 6 (ref 5.0–8.0)

## 2020-11-03 LAB — CBC WITH DIFFERENTIAL/PLATELET
Abs Immature Granulocytes: 0.05 10*3/uL (ref 0.00–0.07)
Basophils Absolute: 0.1 10*3/uL (ref 0.0–0.1)
Basophils Relative: 1 %
Eosinophils Absolute: 0.2 10*3/uL (ref 0.0–0.5)
Eosinophils Relative: 2 %
HCT: 45.5 % (ref 39.0–52.0)
Hemoglobin: 15.4 g/dL (ref 13.0–17.0)
Immature Granulocytes: 0 %
Lymphocytes Relative: 19 %
Lymphs Abs: 2.8 10*3/uL (ref 0.7–4.0)
MCH: 31.4 pg (ref 26.0–34.0)
MCHC: 33.8 g/dL (ref 30.0–36.0)
MCV: 92.7 fL (ref 80.0–100.0)
Monocytes Absolute: 0.5 10*3/uL (ref 0.1–1.0)
Monocytes Relative: 4 %
Neutro Abs: 10.6 10*3/uL — ABNORMAL HIGH (ref 1.7–7.7)
Neutrophils Relative %: 74 %
Platelets: 211 10*3/uL (ref 150–400)
RBC: 4.91 MIL/uL (ref 4.22–5.81)
RDW: 12.4 % (ref 11.5–15.5)
WBC: 14.3 10*3/uL — ABNORMAL HIGH (ref 4.0–10.5)
nRBC: 0 % (ref 0.0–0.2)

## 2020-11-03 LAB — LIPID PANEL
Cholesterol: 277 mg/dL — ABNORMAL HIGH (ref 0–200)
HDL: 39 mg/dL — ABNORMAL LOW (ref >40)
LDL Cholesterol: UNDETERMINED mg/dL (ref 0–99)
Total CHOL/HDL Ratio: 7.1 RATIO
Triglycerides: 413 mg/dL — ABNORMAL HIGH (ref <150)
VLDL: UNDETERMINED mg/dL (ref 0–40)

## 2020-11-03 LAB — BLOOD GAS, VENOUS
Acid-Base Excess: 0.1 mmol/L (ref 0.0–2.0)
Bicarbonate: 23.5 mmol/L (ref 20.0–28.0)
FIO2: 21
O2 Saturation: 82.3 %
Patient temperature: 37.3
pCO2, Ven: 47.8 mmHg (ref 44.0–60.0)
pH, Ven: 7.341 (ref 7.250–7.430)
pO2, Ven: 48.7 mmHg — ABNORMAL HIGH (ref 32.0–45.0)

## 2020-11-03 LAB — COMPREHENSIVE METABOLIC PANEL
ALT: 25 U/L (ref 0–44)
ALT: 22 U/L (ref 0–44)
AST: 22 U/L (ref 15–41)
AST: 20 U/L (ref 15–41)
Albumin: 3.7 g/dL (ref 3.5–5.0)
Albumin: 3.3 g/dL — ABNORMAL LOW (ref 3.5–5.0)
Alkaline Phosphatase: 116 U/L (ref 38–126)
Alkaline Phosphatase: 102 U/L (ref 38–126)
Anion gap: 10 (ref 5–15)
Anion gap: 10 (ref 5–15)
BUN: 17 mg/dL (ref 6–20)
BUN: 17 mg/dL (ref 6–20)
CO2: 25 mmol/L (ref 22–32)
CO2: 23 mmol/L (ref 22–32)
Calcium: 8.9 mg/dL (ref 8.9–10.3)
Calcium: 8.1 mg/dL — ABNORMAL LOW (ref 8.9–10.3)
Chloride: 94 mmol/L — ABNORMAL LOW (ref 98–111)
Chloride: 91 mmol/L — ABNORMAL LOW (ref 98–111)
Creatinine, Ser: 1.28 mg/dL — ABNORMAL HIGH (ref 0.61–1.24)
Creatinine, Ser: 1.43 mg/dL — ABNORMAL HIGH (ref 0.61–1.24)
GFR, Estimated: >60 mL/min (ref >60)
GFR, Estimated: 59 mL/min — ABNORMAL LOW (ref >60)
Glucose, Bld: 578 mg/dL (ref 70–99)
Glucose, Bld: 705 mg/dL (ref 70–99)
Potassium: 5.4 mmol/L — ABNORMAL HIGH (ref 3.5–5.1)
Potassium: 4.9 mmol/L (ref 3.5–5.1)
Sodium: 129 mmol/L — ABNORMAL LOW (ref 135–145)
Sodium: 124 mmol/L — ABNORMAL LOW (ref 135–145)
Total Bilirubin: 0.8 mg/dL (ref 0.3–1.2)
Total Bilirubin: 0.6 mg/dL (ref 0.3–1.2)
Total Protein: 7.6 g/dL (ref 6.5–8.1)
Total Protein: 6.6 g/dL (ref 6.5–8.1)

## 2020-11-03 LAB — CBG MONITORING, ED
Glucose-Capillary: >600 mg/dL (ref 70–99)
Glucose-Capillary: 338 mg/dL — ABNORMAL HIGH (ref 70–99)

## 2020-11-03 LAB — LDL CHOLESTEROL, DIRECT: Direct LDL: 181.3 mg/dL — ABNORMAL HIGH (ref 0–99)

## 2020-11-03 MED ORDER — SODIUM CHLORIDE 0.9 % IV BOLUS
1000.0000 mL | Freq: Once | INTRAVENOUS | Status: AC
  Administered 2020-11-03: 1000 mL via INTRAVENOUS

## 2020-11-03 MED ORDER — INSULIN ASPART 100 UNIT/ML IJ SOLN
10.0000 [IU] | Freq: Once | INTRAMUSCULAR | Status: AC
  Administered 2020-11-03: 10 [IU] via SUBCUTANEOUS
  Filled 2020-11-03: qty 1

## 2020-11-03 NOTE — Progress Notes (Signed)
BP 115/81   Pulse (!) 114   Temp 97.7 F (36.5 C)   Wt 159 lb (72.1 kg)   SpO2 96%   BMI 24.18 kg/m    Subjective:    Patient ID: [name] [name], male    DOB: [birth year], 50 y.o.   MRN: [id number]  HPI: [name] [name] is a 50 y.o. male presenting on 11/03/2020 for No chief complaint on file.   HPI    Pt had a negative covid 19 screening questionnaire.   Pt is 50yoM with routine appointment to follow up dyslipidemia.  Pt didn't get his labs drawn  Pt hasn't seen his endocrinologist since December.  His Last a1c in April was > 15.5.  Pt says he checked his blood sugar yesterday and it was  "3-something".    Pt is still goig to [facility] for MH.    He has gastric emptying study later this week.  He has history of Barretts esophagus.    He Has urology surgery next week for a scrotal cyst  Pt has not seen his heart doctor since november 2020.  Pt has CAD  Pt says he feels okay today.   Relevant past medical, surgical, family and social history reviewed and updated as indicated. Interim medical history since our last visit reviewed. Allergies and medications reviewed and updated.   Current Outpatient Medications:  .  albuterol (PROVENTIL HFA) 108 (90 Base) MCG/ACT inhaler, INHALE 2 PUFFS BY MOUTH EVERY 6 HOURS AS NEEDED FOR COUGHING, WHEEZING, OR SHORTNESS OF BREATH, Disp: 20.1 g, Rfl: 0 .  ARIPiprazole (ABILIFY) 5 MG tablet, Take 5 mg by mouth daily., Disp: , Rfl:  .  ASPIRIN LOW DOSE 81 MG EC tablet, Take 81 mg by mouth every morning., Disp: , Rfl:  .  atorvastatin (LIPITOR) 80 MG tablet, TAKE 1 Tablet BY MOUTH ONCE EVERY DAY, Disp: 90 tablet, Rfl: 0 .  doxycycline (VIBRAMYCIN) 100 MG capsule, Take 1 capsule (100 mg total) by mouth 2 (two) times daily., Disp: 28 capsule, Rfl: 0 .  famotidine (PEPCID) 40 MG tablet, Take 1 tablet (40 mg total) by mouth 2 (two) times daily., Disp: 180 tablet, Rfl: 3 .  Fluticasone-Salmeterol (ADVAIR DISKUS IN), Inhale into the lungs., Disp:  , Rfl:  .  insulin aspart (NOVOLOG) 100 UNIT/ML injection, Inject 14-20 Units into the skin 3 (three) times daily before meals., Disp: 15 mL, Rfl: 2 .  insulin glargine (LANTUS SOLOSTAR) 100 UNIT/ML Solostar Pen, Inject 60 Units into the skin at bedtime. (Patient taking differently: Inject 65 Units into the skin at bedtime.), Disp: 15 mL, Rfl:  .  nitroGLYCERIN (NITROSTAT) 0.4 MG SL tablet, Place 1 tablet (0.4 mg total) under the tongue every 5 (five) minutes as needed for chest pain., Disp: 25 tablet, Rfl: 3 .  sertraline (ZOLOFT) 100 MG tablet, Take 1 tablet by mouth daily., Disp: , Rfl:  .  metFORMIN (GLUCOPHAGE) 500 MG tablet, Take 1 tablet (500 mg total) by mouth 2 (two) times daily with a meal. (Patient not taking: Reported on 11/03/2020), Disp: 60 tablet, Rfl: 1 .  metoCLOPramide (REGLAN) 5 MG tablet, Take 1 tablet (5 mg total) by mouth 4 (four) times daily -  before meals and at bedtime. (Patient not taking: Reported on 11/03/2020), Disp: 120 tablet, Rfl: 1 .  promethazine (PHENERGAN) 25 MG suppository, Place 1 suppository (25 mg total) rectally every 8 (eight) hours as needed for refractory nausea / vomiting. (Patient not taking: Reported on 11/03/2020), Disp: 12 each,  Rfl: 0    Review of Systems  Per HPI unless specifically indicated above     Objective:    BP 115/81   Pulse (!) 114   Temp 97.7 F (36.5 C)   Wt 159 lb (72.1 kg)   SpO2 96%   BMI 24.18 kg/m   Wt Readings from Last 3 Encounters:  11/03/20 159 lb (72.1 kg)  10/16/20 160 lb 8 oz (72.8 kg)  10/03/20 160 lb (72.6 kg)    Physical Exam Vitals reviewed.  Constitutional:      General: He is not in acute distress.    Appearance: He is well-developed. He is not toxic-appearing.  HENT:     Head: Normocephalic and atraumatic.  Cardiovascular:     Rate and Rhythm: Regular rhythm. Tachycardia present.  Pulmonary:     Effort: Pulmonary effort is normal.     Breath sounds: Normal breath sounds. No wheezing.  Abdominal:      General: Bowel sounds are normal.     Palpations: Abdomen is soft.     Tenderness: There is no abdominal tenderness.  Musculoskeletal:     Cervical back: Neck supple.     Right lower leg: No edema.     Left lower leg: No edema.  Lymphadenopathy:     Cervical: No cervical adenopathy.  Skin:    General: Skin is warm and dry.  Neurological:     Mental Status: He is alert and oriented to person, place, and time.  Psychiatric:        Behavior: Behavior normal.             Assessment & Plan:    Encounter Diagnoses  Name Primary?  Marland Kitchen Uncontrolled type 2 diabetes mellitus with hyperglycemia (HCC) Yes  . Hyperlipidemia, unspecified hyperlipidemia type   . Tachycardia   . Personal history of noncompliance with medical treatment, presenting hazards to health   . Coronary artery disease involving native coronary artery of native heart without angina pectoris      -pt to Get fasting labs -pt to F/u with endocrinology- pt to call -will Re-refer to cardiology- past due for f/u -He says his cafa is active -pt is notified of his DM eye exam in July -pt to follow up here 3 months.  He is to contact office sooner prn

## 2020-11-03 NOTE — Discharge Instructions (Addendum)
Lab work has improved since fluids and insulin.  Please continue taking your insulin as prescribed.  I need you to stay away from sugary drinks like sodas, fruit juice, things high in carbs like white bread, pasta, rice as these will increase your blood sugar.  I have given you recommendations for healthy eating please read.  Need you to follow-up with your endocrinologist for further evaluation.  Come back to the emergency department if you develop chest pain, shortness of breath, severe abdominal pain, uncontrolled nausea, vomiting, diarrhea.

## 2020-11-03 NOTE — ED Triage Notes (Signed)
Pt to ED with c/o hyperglycemia in the 500's at [facility].  Pt denies n/v.

## 2020-11-03 NOTE — ED Provider Notes (Signed)
[facility] EMERGENCY DEPARTMENT Provider Note   CSN: [id number] Arrival date & time: 11/03/20  1342     History Chief Complaint  Patient presents with  . Hyperglycemia    [name] [name] is a 50 y.o. male.  HPI   Patient with significant medical history of diabetes type 2, gastroparesis, CAD, hypertension, COPD presents with chief complaint of elevated blood sugars.  Patient states he went to have some blood work performed today and they noted that his blood sugar was very high and he was told to come to the ED for further evaluation.  Patient states he has been without his metformin for last few weeks has not seen his endocrinologist but he does endorse that he has been taking his insulin as prescribed.  He endorses feeling slightly lightheaded, increased thirst, and frequent urination, he has subjective fevers and chills, he denies nasal congestion, sore throat or cough, denies chest pain.  Denies any recent sick contacts, is up-to-date on his COVID-vaccine.  Denies alleviating factors.  Past Medical History:  Diagnosis Date  . Anxiety   . Arthritis   . CAD (coronary artery disease)    Moderate LAD disease 2016 - Dr. Jacinto HalimGanji  . Chronic bronchitis (HCC)   . Chronic upper back pain   . COPD (chronic obstructive pulmonary disease) (HCC)   . Depression   . Diabetic peripheral neuropathy (HCC)   . GERD (gastroesophageal reflux disease)   . History of gout   . Hyperlipemia   . Hypertension   . Migraine   . Noncompliance   . 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 Sleep apnea 2016   "could not afford CPAP".  . Type 2 diabetes mellitus [facility](HCC)     Patient Active Problem List   Diagnosis Date Noted  . Severe sepsis (HCC) 09/03/2020  . Personal history of noncompliance with medical treatment, presenting hazards to health 05/27/2020  . Barrett's esophagus 01/23/2019  . Diarrhea 01/23/2019  . MDD (major depressive disorder), severe (HCC) 12/17/2018  . DKA, type 2 (HCC) 11/07/2018  .  High anion gap metabolic acidosis 03/22/2018  . Epigastric abdominal pain 03/22/2018  . Nausea vomiting and diarrhea 03/22/2018  . AKI (acute kidney injury) (HCC) 03/22/2018  . Uncontrolled type 2 diabetes mellitus with hyperglycemia (HCC) 03/22/2018  . CAD (coronary artery disease) 02/15/2018  . Mixed hyperlipidemia 02/15/2018  . Current smoker 02/15/2018  . Diabetic gastroparesis (HCC) 06/02/2017  . ARF (acute renal failure) (HCC) 05/30/2017  . DKA (diabetic ketoacidoses) 10/15/2016  . Gastroesophageal reflux disease 10/15/2016  . Anxiety with depression 10/15/2016  . HTN (hypertension) 10/15/2016  . Leukocytosis 11/25/2015  . Intractable nausea and vomiting 11/25/2015  . Dental abscess 11/25/2015  . Nausea and vomiting 11/25/2015  . Chest pain 03/27/2015    Past Surgical History:  Procedure Laterality Date  . ANKLE SURGERY Right 1982   "had extra bones in there; took them out"  . APPENDECTOMY  1975  . BIOPSY  12/26/2018   Procedure: BIOPSY;  Surgeon: Malissa Hippoehman, Najeeb U, MD;  Location: [facility] SUITE;  Service: Endoscopy;;  duodenal biopsies  . BIOPSY  03/23/2019   Procedure: BIOPSY;  Surgeon: Malissa Hippoehman, Najeeb U, MD;  Location: [facility] SU[facility];  Service: Endoscopy;;  esophagus  . CARDIAC CATHETERIZATION N/A 03/28/2015   Procedure: Left Heart Cath and Coronary Angiography;  Surgeon: Yates DecampJay Ganji, MD;  [facility] LAB;  Service: Cardiovascular;  Laterality: N/A;  . CARDIAC CATHETERIZATION N/A 03/28/2015   Procedure: Intravascular Pressure Wire/FFR  Study;  Surgeon: Yates Decamp, MD;  Location: [facility] INVASIVE CV LAB;  Service: Cardiovascular;  Laterality: N/A;  . CARPAL TUNNEL RELEASE Left ~ 2008  . COLONOSCOPY WITH ESOPHAGOGASTRODUODENOSCOPY (EGD)    . COLONOSCOPY WITH PROPOFOL N/A 12/24/2019   Procedure: COLONOSCOPY WITH PROPOFOL;  Surgeon: Malissa Hippo, MD;  Location: [facility];  Service: Endoscopy;  Laterality: N/A;  955  . ELBOW FRACTURE SURGERY Left ~ 2008  .  ESOPHAGOGASTRODUODENOSCOPY N/A 12/26/2018   Procedure: ESOPHAGOGASTRODUODENOSCOPY (EGD);  Surgeon: Malissa Hippo, MD;  Location: [facility];  Service: Endoscopy;  Laterality: N/A;  . ESOPHAGOGASTRODUODENOSCOPY (EGD) WITH PROPOFOL N/A 03/23/2019   Procedure: ESOPHAGOGASTRODUODENOSCOPY (EGD) WITH PROPOFOL;  Surgeon: Malissa Hippo, MD;  Location: [facility];  Service: Endoscopy;  Laterality: N/A;  7:30  . FRACTURE SURGERY     ankle and elbow  . PILONIDAL CYST EXCISION N/A 03/24/2017   Procedure: EXCISION CHRONIC  PILONIDAL ABSCESS;  Surgeon: [name] Miyamoto, MD;  Location: [facility] ORS;  Service: General;  Laterality: N/A;  . TENDON REPAIR Left ~ 2004   "main tendon in my ankle"       Family History  Problem Relation Age of Onset  . Congestive Heart Failure Sister   . Diabetes Mother   . Hypertension Mother   . Cancer Mother   . Diabetes Father   . Hypertension Father   . Hyperlipidemia Father     Social History   Tobacco Use  . Smoking status: Current Every Day Smoker    Packs/day: 0.50    Years: 34.00    Pack years: 17.00    Types: Cigarettes  . Smokeless tobacco: Never Used  . Tobacco comment: 1/2 pack a day  Vaping Use  . Vaping Use: Never used  Substance Use Topics  . Alcohol use: Never    Alcohol/week: 0.0 standard drinks  . Drug use: Never    Home Medications Prior to Admission medications   Medication Sig Start Date End Date Taking? Authorizing Provider  albuterol (PROVENTIL HFA) 108 (90 Base) MCG/ACT inhaler INHALE 2 PUFFS BY MOUTH EVERY 6 HOURS AS NEEDED FOR COUGHING, WHEEZING, OR SHORTNESS OF BREATH 09/22/20   [name], PA-C  ARIPiprazole (ABILIFY) 5 MG tablet Take 5 mg by mouth daily.    [provider]  ASPIRIN LOW DOSE 81 MG EC tablet Take 81 mg by mouth every morning. 09/05/20   [provider]  atorvastatin (LIPITOR) 80 MG tablet TAKE 1 Tablet BY MOUTH ONCE EVERY DAY 09/22/20   [name], PA-C  doxycycline (VIBRAMYCIN)  100 MG capsule Take 1 capsule (100 mg total) by mouth 2 (two) times daily. 10/07/20   McKenzie, Mardene Celeste, MD  famotidine (PEPCID) 40 MG tablet Take 1 tablet (40 mg total) by mouth 2 (two) times daily. 10/16/20   Dolores Frame, MD  Fluticasone-Salmeterol (ADVAIR DISKUS IN) Inhale into the lungs.    [provider]  insulin aspart (NOVOLOG) 100 UNIT/ML injection Inject 14-20 Units into the skin 3 (three) times daily before meals. 05/08/20   Roma Kayser, MD  insulin glargine (LANTUS SOLOSTAR) 100 UNIT/ML Solostar Pen Inject 60 Units into the skin at bedtime. Patient taking differently: Inject 65 Units into the skin at bedtime. 05/08/20   Roma Kayser, MD  metFORMIN (GLUCOPHAGE) 500 MG tablet Take 1 tablet (500 mg total) by mouth 2 (two) times daily with a meal. Patient not taking: Reported on 11/03/2020 01/17/20   Roma Kayser, MD  metoCLOPramide (REGLAN) 5  MG tablet Take 1 tablet (5 mg total) by mouth 4 (four) times daily -  before meals and at bedtime. Patient not taking: Reported on 11/03/2020 11/22/19   Tawni Pummel B, PA-C  nitroGLYCERIN (NITROSTAT) 0.4 MG SL tablet Place 1 tablet (0.4 mg total) under the tongue every 5 (five) minutes as needed for chest pain. 10/05/17   [name], PA-C  promethazine (PHENERGAN) 25 MG suppository Place 1 suppository (25 mg total) rectally every 8 (eight) hours as needed for refractory nausea / vomiting. Patient not taking: Reported on 11/03/2020 12/20/18   Vassie Loll, MD  sertraline (ZOLOFT) 100 MG tablet Take 1 tablet by mouth daily. 01/09/20   [provider]    Allergies    Omeprazole magnesium, Bee venom, Esomeprazole, and Morphine and related  Review of Systems   Review of Systems  Constitutional: Positive for chills and fever.  HENT: Negative for congestion and sore throat.   Respiratory: Negative for shortness of breath.   Cardiovascular: Negative for chest pain.  Gastrointestinal: Negative  for abdominal pain, diarrhea, nausea and vomiting.  Genitourinary: Positive for urgency. Negative for decreased urine volume, dysuria and enuresis.  Musculoskeletal: Negative for back pain.  Skin: Negative for rash.  Neurological: Positive for light-headedness. Negative for dizziness and headaches.  Hematological: Does not bruise/bleed easily.    Physical Exam Updated Vital Signs BP 125/83   Pulse (!) 103   Temp 99.1 F (37.3 C) (Oral)   Resp (!) 35   Ht 5\' 8"  (1.727 m)   Wt 72.1 kg   SpO2 98%   BMI 24.18 kg/m   Physical Exam Vitals and nursing note reviewed.  Constitutional:      General: He is not in acute distress.    Appearance: He is not ill-appearing.  HENT:     Head: Normocephalic and atraumatic.     Nose: No congestion.     Mouth/Throat:     Mouth: Mucous membranes are dry.     Pharynx: Oropharynx is clear. No oropharyngeal exudate or posterior oropharyngeal erythema.  Eyes:     Conjunctiva/sclera: Conjunctivae normal.  Cardiovascular:     Rate and Rhythm: Regular rhythm. Tachycardia present.     Pulses: Normal pulses.     Heart sounds: No murmur heard. No friction rub. No gallop.   Pulmonary:     Effort: No respiratory distress.     Breath sounds: No wheezing, rhonchi or rales.  Abdominal:     Palpations: Abdomen is soft.     Tenderness: There is no abdominal tenderness.  Musculoskeletal:     Right lower leg: No edema.     Left lower leg: No edema.  Skin:    General: Skin is warm and dry.  Neurological:     Mental Status: He is alert.  Psychiatric:        Mood and Affect: Mood normal.     ED Results / Procedures / Treatments   Labs (all labs ordered are listed, but only abnormal results are displayed) Labs Reviewed  CBC WITH DIFFERENTIAL/PLATELET - Abnormal; Notable for the following components:      Result Value   WBC 14.3 (*)    Neutro Abs 10.6 (*)    All other components within normal limits  CBG MONITORING, ED - Abnormal; Notable for the  following components:   Glucose-Capillary >600 (*)    All other components within normal limits  COMPREHENSIVE METABOLIC PANEL  URINALYSIS, ROUTINE W REFLEX MICROSCOPIC  BLOOD GAS, VENOUS  EKG None  Radiology No results found.  Procedures .Critical Care Performed by: Carroll Sage, PA-C Authorized by: Carroll Sage, PA-C   Critical care provider statement:    Critical care time (minutes):  45   Critical care was necessary to treat or prevent imminent or life-threatening deterioration of the following conditions:  Metabolic crisis   Critical care was time spent personally by me on the following activities:  Discussions with consultants, evaluation of patient's response to treatment, examination of patient, ordering and performing treatments and interventions, ordering and review of laboratory studies, ordering and review of radiographic studies, pulse oximetry, re-evaluation of patient's condition, obtaining history from patient or surrogate and review of old charts   I assumed direction of critical care for this patient from another provider in my specialty: no       Medications Ordered in ED Medications  sodium chloride 0.9 % bolus 1,000 mL (has no administration in time range)    ED Course  I have reviewed the triage vital signs and the nursing notes.  Pertinent labs & imaging results that were available during my care of the patient were reviewed by me and considered in my medical decision making (see chart for details).    MDM Rules/Calculators/A&P                         Initial impression-patient presents with elevated blood sugar.  He is alert, does not appear to be in distress, vital signs show tachycardia.  I am concerned for HHS, DKA will obtain basic lab work-up provide patient fluids and reassess.  Work-up-CBC shows leukocytosis of 14.3, CMP shows pseudohyponatremia of 124, corrected for hyperglycemia is 147, hyperglycemia of 705, creatinine 1.3, UA  negative for ketones, nitrates, leukocytes, red or white blood cells.  VBG shows decrease venous O2 of 48.7.  Chest x-ray negative for acute findings.  Reassessment-patient was reassessed after 2 L of fluids as well as 10 units of NovoLog patient states he feels much better, he has no complaints at this time.  Vital signs remained stable, patient is agreed for discharge at this time.  Rule out-I have low suspicion for systemic infection as patient is nontoxic-appearing, vital signs reassuring.  Patient was noted to have leukocytosis but I feel this is more a acute phase reactant as he has an elevated glucose of 700.  Low suspicion for DKA or HSS as there is no noted acidosis seen on VBG or CMP, there is no ketones present in urine.  Low suspicion for UTI or pyelonephritis patient denies urinary symptoms, no signs of infection present on UA.  Low suspicion for pancreatitis as patient denies abdominal pain, there is no epigastric pain on my exam.  Plan-  1.  Hyperglycemia improving-I suspect this secondary due to noncompliance with medication, will recommend that he follows up with his endocrinologist for further evaluation, recommend diabetic meal plan.  Vital signs have remained stable, no indication for hospital admission.  Patient discussed with attending and they agreed with assessment and plan.  Patient given at home care as well strict return precautions.  Patient verbalized that they understood agreed to said plan.   Final Clinical Impression(s) / ED Diagnoses Final diagnoses:  None    Rx / DC Orders ED Discharge Orders    None       Carroll Sage, PA-C 11/03/20 1757    Derwood Kaplan, MD 11/04/20 1241

## 2020-11-03 NOTE — Patient Instructions (Addendum)
Diabetic Eye exam  [facility] [address] [place] Thursday December 04, 2020 at 9:00am [phone])  ---------------------------------  Call endocrinology for follow up diabetes

## 2020-11-04 ENCOUNTER — Telehealth: Payer: Self-pay

## 2020-11-04 NOTE — Telephone Encounter (Signed)
Called to follow up with client to assure he knows when his Endocrine appointment with Dr. [name] is. It is scheduled for 11/10/20 at 11am. Client was able to verbalize correct date and time of upcoming appointments.  Discussed transportation to get to Dr. [name] and client states he has a car and if he needs someone to drive, his mother will do so. Discussed the importance of controlling his blood sugars for healing from surgery as well as safety for any upcoming tests.   Client had not checked his blood sugar yet today. It is noted he was seen and treated in [facility] ER for hyperglycemia. He reports he is taking his insulin as directed but not his metformin as he does not have refills since not following up with Dr. [name]. Client reports he has not checked his blood sugar today as he just "got up" encouraged client to check his blood sugars as directed. He reports having testing supplies.   Diabetes: client with appointment with Dr [name] 11/10/20 for which he states he has transportation. Encouraged client to call this RN at least 2 days ahead if for any reason he does not have transportation to this appointment. Client states understanding. Emphasized the importance of follow ups and controlling his blood sugars and risks of not controlling his blood sugars.  Transportation: client states he has reliable available transportation to appointments.  Mental Health: He confirms he is still current with [facility].  Diabetic testing supplies: He states he has meter, strips and supplies for testing his blood sugars. Medications: He states he has his insulins. Plan to follow up again this week to check on his blood sugar readings. His [facility] is current until 07/29/21  Diet/Nutrition/ Exercise Client very vague about his food choices. We have discussed diet in the past and things to avoid. His mother does most of the cooking. Client states he is going to ask to have a visit with dietician at endocrinology  office. Client has expressed interest in an upcoming program of [facility]/[employer] [employer] that will assist in scholarship to assist diabetic clients with other health issues that prevent regular exercise such as walking to get in the the local [employer] for one year to use services and especially use of the pool for low impact exercise. Discussed with client that he will need to contract and commit to using the services a set number times per month and that before he receives the services we would want his providers to clear him first and for his blood sugars to be under control and any surgical areas healed. Client states interest. Will continue to discuss with client further as the program develops.  [name] RN [employer]

## 2020-11-04 NOTE — Telephone Encounter (Signed)
11/03/20 at 2PM Attempted call to follow up with client to determine if he called to make and endocrinology appointment. No answer, left message. Will attempt to call again tomorrow 11/04/20  [name] RN [employer]

## 2020-11-06 ENCOUNTER — Encounter (HOSPITAL_COMMUNITY)
Admission: RE | Admit: 2020-11-06 | Discharge: 2020-11-06 | Disposition: A | Discharge status: Home / Self Care | Payer: Self-pay | Source: Ambulatory Visit | Attending: Gastroenterology | Admitting: Gastroenterology

## 2020-11-06 ENCOUNTER — Encounter (HOSPITAL_COMMUNITY): Payer: Self-pay

## 2020-11-06 DIAGNOSIS — K3184 Gastroparesis: Secondary | ICD-10-CM | POA: Insufficient documentation

## 2020-11-06 MED ORDER — TECHNETIUM TC 99M SULFUR COLLOID
2.0000 | Freq: Once | INTRAVENOUS | Status: AC | PRN
  Administered 2020-11-06: 2.2 via ORAL

## 2020-11-07 NOTE — Patient Instructions (Signed)
[name] [name]  11/07/2020     @PREFPERIOPPHARMACY @   Your procedure is scheduled on 11/13/2020.   Report to 11/15/2020 at  0745   A.M.  Call this number if you have problems the morning of surgery:  [phone]   Remember:  Do not eat or drink after midnight.      Take these medicines the morning of surgery with A SIP OF WATER   abilify, zoloft.  Use your inhalers before you come and bring your rescue inhaler with you.  DO NOT taken any medications for diabetes the morning of your procedure.      Please brush your teeth.  Do not wear jewelry, make-up or nail polish.  Do not wear lotions, powders, or perfumes, or deodorant.  Do not shave 48 hours prior to surgery.  Men may shave face and neck.  Do not bring valuables to the hospital.  [facility] is not responsible for any belongings or valuables.  Contacts, dentures or bridgework may not be worn into surgery.  Leave your suitcase in the car.  After surgery it may be brought to your room.  For patients admitted to the hospital, discharge time will be determined by your treatment team.  Patients discharged the day of surgery will not be allowed to drive home and must have someone with them for 24 hours.    Special instructions:  DO NOT smoke tobacco or vape for 24 hours before your procedure.  Please read over the following fact sheets that you were given. Coughing and Deep Breathing, Surgical Site Infection Prevention, Anesthesia Post-op Instructions, and Care and Recovery After Surgery      Epidermoid Cyst Removal, Care After This sheet gives you information about how to care for yourself after your procedure. Your health care provider may also give you more specific instructions. If you have problems or questions, contact your health careprovider. What can I expect after the procedure? After the procedure, it is common to have: Soreness in the area where your cyst was removed. Tightness or  itchiness from the stitches (sutures) in your skin. Follow these instructions at home: Medicines Take over-the-counter and prescription medicines only as told by your health care provider. If you were prescribed an antibiotic medicine or ointment, take or apply it as told by your health care provider. Do not stop using the antibiotic even if you start to feel better. Incision care  Follow instructions from your health care provider about how to take care of your incision. Make sure you: Wash your hands with soap and water for at least 20 seconds before you change your bandage (dressing). If soap and water are not available, use hand sanitizer. Change your dressing as told by your health care provider. Leave sutures, skin glue, or adhesive strips in place. These skin closures may need to stay in place for 1-2 weeks or longer. If adhesive strip edges start to loosen and curl up, you may trim the loose edges. Do not remove adhesive strips completely unless your health care provider tells you to do that. Keep the dressing dry until your health care provider says that it can be removed. After your dressing is off, check your incision area every day for signs of infection. Check for: Redness, swelling, or pain. Fluid or blood. Warmth. Pus or a bad smell.  General instructions Do not take baths, swim, or use a hot tub until your health care provider approves. Ask your  health care provider if you may take showers. You may only be allowed to take sponge baths. Your health care provider may ask you to avoid contact sports or activities that take a lot of effort. Do not do anything that stretches or puts pressure on your incision. You can return to your normal diet. Keep all follow-up visits. This is important. Contact a health care provider if: You have a fever. You have redness, swelling, or pain in the incision area. You have fluid or blood coming from your incision. You have pus or a bad smell  coming from your incision. Your incision feels warm to the touch. Your cyst grows back. Get help right away if: If the incision site suddenly increases in size and you have pain at the incision site. You may be checked for a collection of blood under the skin from the procedure (hematoma). Summary After the procedure, it is common to have soreness in the area where your cyst was removed. Take or apply over-the-counter and prescription medicines only as told by your health care provider. Follow instructions from your health care provider about how to take care of your incision. This information is not intended to replace advice given to you by your health care provider. Make sure you discuss any questions you have with your healthcare provider. Document Revised: 08/22/2019 Document Reviewed: 08/22/2019 Elsevier Patient Education  2022 [employer] General Anesthesia, Adult, Care After This sheet gives you information about how to care for yourself after your procedure. Your health care provider may also give you more specific instructions. If you have problems or questions, contact your health careprovider. What can I expect after the procedure? After the procedure, the following side effects are common: Pain or discomfort at the IV site. Nausea. Vomiting. Sore throat. Trouble concentrating. Feeling cold or chills. Feeling weak or tired. Sleepiness and fatigue. Soreness and body aches. These side effects can affect parts of the body that were not involved in surgery. Follow these instructions at home: For the time period you were told by your health care provider:  Rest. Do not participate in activities where you could fall or become injured. Do not drive or use machinery. Do not drink alcohol. Do not take sleeping pills or medicines that cause drowsiness. Do not make important decisions or sign legal documents. Do not take care of children on your own.  Eating and  drinking Follow any instructions from your health care provider about eating or drinking restrictions. When you feel hungry, start by eating small amounts of foods that are soft and easy to digest (bland), such as toast. Gradually return to your regular diet. Drink enough fluid to keep your urine pale yellow. If you vomit, rehydrate by drinking water, juice, or clear broth. General instructions If you have sleep apnea, surgery and certain medicines can increase your risk for breathing problems. Follow instructions from your health care provider about wearing your sleep device: Anytime you are sleeping, including during daytime naps. While taking prescription pain medicines, sleeping medicines, or medicines that make you drowsy. Have a responsible adult stay with you for the time you are told. It is important to have someone help care for you until you are awake and alert. Return to your normal activities as told by your health care provider. Ask your health care provider what activities are safe for you. Take over-the-counter and prescription medicines only as told by your health care provider. If you smoke, do not smoke without supervision. Keep all  follow-up visits as told by your health care provider. This is important. Contact a health care provider if: You have nausea or vomiting that does not get better with medicine. You cannot eat or drink without vomiting. You have pain that does not get better with medicine. You are unable to pass urine. You develop a skin rash. You have a fever. You have redness around your IV site that gets worse. Get help right away if: You have difficulty breathing. You have chest pain. You have blood in your urine or stool, or you vomit blood. Summary After the procedure, it is common to have a sore throat or nausea. It is also common to feel tired. Have a responsible adult stay with you for the time you are told. It is important to have someone help care  for you until you are awake and alert. When you feel hungry, start by eating small amounts of foods that are soft and easy to digest (bland), such as toast. Gradually return to your regular diet. Drink enough fluid to keep your urine pale yellow. Return to your normal activities as told by your health care provider. Ask your health care provider what activities are safe for you. This information is not intended to replace advice given to you by your health care provider. Make sure you discuss any questions you have with your healthcare provider. Document Revised: 01/31/2020 Document Reviewed: 08/30/2019 Elsevier Patient Education  2022 [employer] How to Use Chlorhexidine for Bathing Chlorhexidine gluconate (CHG) is a germ-killing (antiseptic) solution that is used to clean the skin. It can get rid of the bacteria that normally live on the skin and can keep them away for about 24 hours. To clean your skin with CHG, you may be given: A CHG solution to use in the shower or as part of a sponge bath. A prepackaged cloth that contains CHG. Cleaning your skin with CHG may help lower the risk for infection: While you are staying in the intensive care unit of the hospital. If you have a vascular access, such as a central line, to provide short-term or long-term access to your veins. If you have a catheter to drain urine from your bladder. If you are on a ventilator. A ventilator is a machine that helps you breathe by moving air in and out of your lungs. After surgery. What are the risks? Risks of using CHG include: A skin reaction. Hearing loss, if CHG gets in your ears. Eye injury, if CHG gets in your eyes and is not rinsed out. The CHG product catching fire. Make sure that you avoid smoking and flames after applying CHG to your skin. Do not use CHG: If you have a chlorhexidine allergy or have previously reacted to chlorhexidine. On babies younger than 63 months of age. How to use CHG  solution Use CHG only as told by your health care provider, and follow the instructions on the label. Use the full amount of CHG as directed. Usually, this is one bottle. During a shower Follow these steps when using CHG solution during a shower (unless your health care provider gives you different instructions): Start the shower. Use your normal soap and shampoo to wash your face and hair. Turn off the shower or move out of the shower stream. Pour the CHG onto a clean washcloth. Do not use any type of brush or rough-edged sponge. Starting at your neck, lather your body down to your toes. Make sure you follow these instructions: If you  will be having surgery, pay special attention to the part of your body where you will be having surgery. Scrub this area for at least 1 minute. Do not use CHG on your head or face. If the solution gets into your ears or eyes, rinse them well with water. Avoid your genital area. Avoid any areas of skin that have broken skin, cuts, or scrapes. Scrub your back and under your arms. Make sure to wash skin folds. Let the lather sit on your skin for 1-2 minutes or as long as told by your health care provider. Thoroughly rinse your entire body in the shower. Make sure that all body creases and crevices are rinsed well. Dry off with a clean towel. Do not put any substances on your body afterward--such as powder, lotion, or perfume--unless you are told to do so by your health care provider. Only use lotions that are recommended by the manufacturer. Put on clean clothes or pajamas. If it is the night before your surgery, sleep in clean sheets.  During a sponge bath Follow these steps when using CHG solution during a sponge bath (unless your health care provider gives you different instructions): Use your normal soap and shampoo to wash your face and hair. Pour the CHG onto a clean washcloth. Starting at your neck, lather your body down to your toes. Make sure you follow  these instructions: If you will be having surgery, pay special attention to the part of your body where you will be having surgery. Scrub this area for at least 1 minute. Do not use CHG on your head or face. If the solution gets into your ears or eyes, rinse them well with water. Avoid your genital area. Avoid any areas of skin that have broken skin, cuts, or scrapes. Scrub your back and under your arms. Make sure to wash skin folds. Let the lather sit on your skin for 1-2 minutes or as long as told by your health care provider. Using a different clean, wet washcloth, thoroughly rinse your entire body. Make sure that all body creases and crevices are rinsed well. Dry off with a clean towel. Do not put any substances on your body afterward--such as powder, lotion, or perfume--unless you are told to do so by your health care provider. Only use lotions that are recommended by the manufacturer. Put on clean clothes or pajamas. If it is the night before your surgery, sleep in clean sheets. How to use CHG prepackaged cloths Only use CHG cloths as told by your health care provider, and follow the instructions on the label. Use the CHG cloth on clean, dry skin. Do not use the CHG cloth on your head or face unless your health care provider tells you to. When washing with the CHG cloth: Avoid your genital area. Avoid any areas of skin that have broken skin, cuts, or scrapes. Before surgery Follow these steps when using a CHG cloth to clean before surgery (unless your health care provider gives you different instructions): Using the CHG cloth, vigorously scrub the part of your body where you will be having surgery. Scrub using a back-and-forth motion for 3 minutes. The area on your body should be completely wet with CHG when you are done scrubbing. Do not rinse. Discard the cloth and let the area air-dry. Do not put any substances on the area afterward, such as powder, lotion, or perfume. Put on clean  clothes or pajamas. If it is the night before your surgery, sleep in clean  sheets.  For general bathing Follow these steps when using CHG cloths for general bathing (unless your health care provider gives you different instructions). Use a separate CHG cloth for each area of your body. Make sure you wash between any folds of skin and between your fingers and toes. Wash your body in the following order, switching to a new cloth after each step: The front of your neck, shoulders, and chest. Both of your arms, under your arms, and your hands. Your stomach and groin area, avoiding the genitals. Your right leg and foot. Your left leg and foot. The back of your neck, your back, and your buttocks. Do not rinse. Discard the cloth and let the area air-dry. Do not put any substances on your body afterward--such as powder, lotion, or perfume--unless you are told to do so by your health care provider. Only use lotions that are recommended by the manufacturer. Put on clean clothes or pajamas. Contact a health care provider if: Your skin gets irritated after scrubbing. You have questions about using your solution or cloth. Get help right away if: Your eyes become very red or swollen. Your eyes itch badly. Your skin itches badly and is red or swollen. Your hearing changes. You have trouble seeing. You have swelling or tingling in your mouth or throat. You have trouble breathing. You swallow any chlorhexidine. Summary Chlorhexidine gluconate (CHG) is a germ-killing (antiseptic) solution that is used to clean the skin. Cleaning your skin with CHG may help to lower your risk for infection. You may be given CHG to use for bathing. It may be in a bottle or in a prepackaged cloth to use on your skin. Carefully follow your health care provider's instructions and the instructions on the product label. Do not use CHG if you have a chlorhexidine allergy. Contact your health care provider if your skin gets  irritated after scrubbing. This information is not intended to replace advice given to you by your health care provider. Make sure you discuss any questions you have with your healthcare provider. Document Revised: 09/28/2019 Document Reviewed: 11/02/2019 Elsevier Patient Education  2022 [employer].

## 2020-11-10 ENCOUNTER — Encounter: Payer: Self-pay | Admitting: "Endocrinology

## 2020-11-10 ENCOUNTER — Ambulatory Visit (INDEPENDENT_AMBULATORY_CARE_PROVIDER_SITE_OTHER): Payer: Self-pay | Admitting: "Endocrinology

## 2020-11-10 VITALS — BP 102/70 | HR 118 | Ht 68.0 in | Wt 169.4 lb

## 2020-11-10 DIAGNOSIS — E1165 Type 2 diabetes mellitus with hyperglycemia: Secondary | ICD-10-CM

## 2020-11-10 DIAGNOSIS — Z9119 Patient's noncompliance with other medical treatment and regimen: Secondary | ICD-10-CM

## 2020-11-10 DIAGNOSIS — F17200 Nicotine dependence, unspecified, uncomplicated: Secondary | ICD-10-CM

## 2020-11-10 DIAGNOSIS — E782 Mixed hyperlipidemia: Secondary | ICD-10-CM

## 2020-11-10 DIAGNOSIS — Z91199 Patient's noncompliance with other medical treatment and regimen due to unspecified reason: Secondary | ICD-10-CM

## 2020-11-10 MED ORDER — METFORMIN HCL 500 MG PO TABS: 500.0000 mg | ORAL_TABLET | Freq: Two times a day (BID) | ORAL | 2 refills | Status: DC

## 2020-11-10 MED ORDER — GLIPIZIDE ER 5 MG PO TB24: 5.0000 mg | ORAL_TABLET | Freq: Every day | ORAL | 3 refills | Status: DC

## 2020-11-10 MED ORDER — ACCU-CHEK GUIDE ME W/DEVICE KIT: 1.0000 | PACK | 0 refills | Status: DC

## 2020-11-10 MED ORDER — ACCU-CHEK GUIDE VI STRP: ORAL_STRIP | 2 refills | Status: DC

## 2020-11-10 MED ORDER — LANTUS SOLOSTAR 100 UNIT/ML ~~LOC~~ SOPN: 60.0000 [IU] | PEN_INJECTOR | Freq: Every day | SUBCUTANEOUS | 1 refills | Status: DC

## 2020-11-10 NOTE — Progress Notes (Signed)
11/10/2020, 12:52 PM  Endocrinology follow-up note   Subjective:    Patient ID: [name] [name], male    DOB: January 01, 1971.  [name] [name] is being seen in follow-up after he was seen in consultation for management of currently uncontrolled symptomatic diabetes requested by  Soyla Dryer, PA-C.   Past Medical History:  Diagnosis Date   Anxiety    Arthritis    CAD (coronary artery disease)    Moderate LAD disease 2016 - Dr. 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   Type 2 diabetes mellitus ([place])     Past Surgical History:  Procedure Laterality Date   ANKLE SURGERY Right 1982   "had extra bones in there; took them out"   [place]  12/26/2018   Procedure: BIOPSY;  Surgeon: Rogene Houston, MD;  [facility];  Service: Endoscopy;;  duodenal biopsies   BIOPSY  03/23/2019   Procedure: BIOPSY;  Surgeon: Rogene Houston, MD;  [facility];  Service: Endoscopy;;  esophagus   CARDIAC CATHETERIZATION N/A 03/28/2015   Procedure: Left Heart Cath and Coronary Angiography;  Surgeon: Adrian Prows, MD;  Location: [place] [facility];  Service: Cardiovascular;  Laterality: N/A;   CARDIAC CATHETERIZATION N/A 03/28/2015   Procedure: Intravascular Pressure Wire/FFR Study;  Surgeon: Adrian Prows, MD;  Location: [place] [facility];  Service: Cardiovascular;  Laterality: N/A;   CARPAL TUNNEL RELEASE Left ~ 2008   COLONOSCOPY WITH ESOPHAGOGASTRODUODENOSCOPY (EGD)     COLONOSCOPY WITH PROPOFOL N/A 12/24/2019   Procedure: COLONOSCOPY WITH PROPOFOL;   Surgeon: Rogene Houston, MD;  [facility];  Service: Endoscopy;  Laterality: N/A;  955   ELBOW FRACTURE SURGERY Left ~ 2008   ESOPHAGOGASTRODUODENOSCOPY N/A 12/26/2018   Procedure: ESOPHAGOGASTRODUODENOSCOPY (EGD);  Surgeon: Rogene Houston, MD;  [facility];  Service: Endoscopy;  Laterality: N/A;   ESOPHAGOGASTRODUODENOSCOPY (EGD) WITH PROPOFOL N/A 03/23/2019   Procedure: ESOPHAGOGASTRODUODENOSCOPY (EGD) WITH PROPOFOL;  Surgeon: Rogene Houston, MD;  [facility];  Service: Endoscopy;  Laterality: N/A;  7:30   FRACTURE SURGERY     ankle and elbow   PILONIDAL CYST EXCISION N/A 03/24/2017   Procedure: EXCISION CHRONIC  PILONIDAL ABSCESS;  Surgeon: Coralie Keens, MD;  Location: [facility];  Service: General;  Laterality: N/A;   TENDON REPAIR Left ~ 2004   "main tendon in my ankle"    Social History   Socioeconomic History   Marital status: Single    Spouse name: Not on file   Number of children: 0   Years of education: 10th g   Highest education level: Not on file  Occupational History   Occupation: [employer]  Tobacco Use   Smoking status: Every Day    Packs/day: 0.50    Years: 34.00    Pack years: 17.00    Types: Cigarettes   Smokeless tobacco: Never   Tobacco comments:    1/2 pack a day  Vaping Use   Vaping Use: Never used  Substance and Sexual Activity   Alcohol use: Never    Alcohol/week: 0.0 standard drinks   Drug use: Never   Sexual activity: Not Currently  Other Topics Concern   Not on file  Social History Narrative   His main caretaker, his mother has terminal metastatic cancer.   Social Determinants of Health   Financial Resource Strain: Not on file  Food Insecurity: Not on file  Transportation Needs: Not on file  Physical Activity: Not on file  Stress: Not on file  Social Connections: Not on file    Family History  Problem Relation Age of Onset   Congestive Heart Failure Sister    Diabetes Mother     Hypertension Mother    Cancer Mother    Diabetes Father    Hypertension Father    Hyperlipidemia Father     Outpatient Encounter Medications as of 11/10/2020  Medication Sig   ASPIRIN LOW DOSE 81 MG EC tablet Take 81 mg by mouth every morning.   Blood Glucose Monitoring Suppl (ACCU-CHEK GUIDE ME) w/Device KIT 1 Piece by Does not apply route as directed.   glipiZIDE (GLUCOTROL XL) 5 MG 24 hr tablet Take 1 tablet (5 mg total) by mouth daily with breakfast.   glucose blood (ACCU-CHEK GUIDE) test strip Use to test glucose 4 times a day   metFORMIN (GLUCOPHAGE) 500 MG tablet Take 1 tablet (500 mg total) by mouth 2 (two) times daily with a meal.   metoCLOPramide (REGLAN) 5 MG tablet Take 1 tablet (5 mg total) by mouth 4 (four) times daily -  before meals and at bedtime.   promethazine (PHENERGAN) 25 MG suppository Place 1 suppository (25 mg total) rectally every 8 (eight) hours as needed for refractory nausea / vomiting.   albuterol (PROVENTIL HFA) 108 (90 Base) MCG/ACT inhaler INHALE 2 PUFFS BY MOUTH EVERY 6 HOURS AS NEEDED FOR COUGHING, WHEEZING, OR SHORTNESS OF BREATH   ARIPiprazole (ABILIFY) 5 MG tablet Take 5 mg by mouth daily.   atorvastatin (LIPITOR) 80 MG tablet TAKE 1 Tablet BY MOUTH ONCE EVERY DAY (Patient taking differently: Take 80 mg by mouth daily.)   doxycycline (VIBRAMYCIN) 100 MG capsule Take 1 capsule (100 mg total) by mouth 2 (two) times daily.   famotidine (PEPCID) 40 MG tablet Take 1 tablet (40 mg total) by mouth 2 (two) times daily.   Fluticasone-Salmeterol (ADVAIR DISKUS IN) Inhale 1 puff into the lungs daily. Rinse mouth after use   insulin aspart (NOVOLOG) 100 UNIT/ML injection Inject 14-20 Units into the skin 3 (three) times daily before meals.   insulin glargine (LANTUS SOLOSTAR) 100 UNIT/ML Solostar Pen Inject 60 Units into the skin at bedtime.   nitroGLYCERIN (NITROSTAT) 0.4 MG SL tablet Place 1 tablet (0.4 mg total) under the tongue every 5 (five) minutes as needed for  chest pain.   sertraline (ZOLOFT) 100 MG tablet Take 1 tablet by mouth daily.   traZODone (DESYREL) 50 MG tablet Take 50 mg by mouth at bedtime. (Patient not taking: No sig reported)   [DISCONTINUED] insulin glargine (LANTUS SOLOSTAR) 100 UNIT/ML Solostar Pen Inject 60 Units into the skin at bedtime. (Patient taking differently: Inject  65 Units into the skin at bedtime.)   [DISCONTINUED] metFORMIN (GLUCOPHAGE) 500 MG tablet Take 1 tablet (500 mg total) by mouth 2 (two) times daily with a meal. (Patient not taking: Reported on 11/03/2020)   No facility-administered encounter medications on file as of 11/10/2020.    ALLERGIES: Allergies  Allergen Reactions   Omeprazole Magnesium Swelling    Face swells, no breathing impairment   Bee Venom    Esomeprazole Swelling    Face swells, no breathing impairment   Morphine And Related     Itching at IV site, dry heaving, burning sensation in throat.    VACCINATION STATUS: Immunization History  Administered Date(s) Administered   Influenza-Unspecified 03/13/2019   Moderna Sars-Covid-2 Vaccination 09/11/2019, 10/11/2019   Pneumococcal Polysaccharide-23 05/25/2017    Diabetes He presents for his follow-up diabetic visit. He has type 2 diabetes mellitus. Onset time: He was diagnosed at approximate age of 59 years. His disease course has been worsening. There are no hypoglycemic associated symptoms. Pertinent negatives for hypoglycemia include no confusion, headaches, pallor or seizures. Associated symptoms include polydipsia and polyuria. Pertinent negatives for diabetes include no chest pain, no fatigue, no polyphagia and no weakness. There are no hypoglycemic complications. Symptoms are worsening. Diabetic complications include heart disease. Risk factors for coronary artery disease include diabetes mellitus, dyslipidemia, male sex, hypertension, family history, tobacco exposure and sedentary lifestyle. Current diabetic treatment includes insulin  injections. His weight is decreasing steadily. He is following a generally unhealthy diet. When asked about meal planning, he reported none. He rarely participates in exercise. His home blood glucose trend is increasing steadily. His breakfast blood glucose range is generally 180-200 mg/dl. His lunch blood glucose range is generally >200 mg/dl. His dinner blood glucose range is generally >200 mg/dl. His bedtime blood glucose range is generally >200 mg/dl. His overall blood glucose range is >200 mg/dl. (He admits that he has not followed the recommendations given to him last visit.  However, most recently he made some changes in his diet.  He presents with a log showing fasting blood glucose between 72-170, postprandial blood glucose ranging between 113-255.  He did not document any hypoglycemia.  His recent A1c was greater than 15%.   ) An ACE inhibitor/angiotensin II receptor blocker is being taken. He does not see a podiatrist.Eye exam is not current.  Hyperlipidemia This is a chronic problem. The current episode started more than 1 year ago. The problem is uncontrolled. Exacerbating diseases include diabetes. Pertinent negatives include no chest pain, myalgias or shortness of breath. Risk factors for coronary artery disease include dyslipidemia, diabetes mellitus, family history, male sex, hypertension and a sedentary lifestyle.  Hypertension This is a chronic problem. The current episode started more than 1 year ago. The problem is controlled. Pertinent negatives include no chest pain, headaches, neck pain, palpitations or shortness of breath. Risk factors for coronary artery disease include diabetes mellitus, dyslipidemia, family history, male gender, sedentary lifestyle and smoking/tobacco exposure. Past treatments include direct vasodilators. Hypertensive end-organ damage includes CAD/MI.    Review of Systems  Constitutional:  Negative for chills, fatigue, fever and unexpected weight change.  HENT:   Negative for dental problem, mouth sores and trouble swallowing.   Eyes:  Negative for visual disturbance.  Respiratory:  Negative for cough, choking, chest tightness, shortness of breath and wheezing.   Cardiovascular:  Negative for chest pain, palpitations and leg swelling.  Gastrointestinal:  Negative for abdominal distention, abdominal pain, constipation, diarrhea, nausea and vomiting.  Endocrine: Positive for  polydipsia and polyuria. Negative for polyphagia.  Genitourinary:  Negative for dysuria, flank pain, hematuria and urgency.  Musculoskeletal:  Negative for back pain, gait problem, myalgias and neck pain.  Skin:  Negative for pallor, rash and wound.  Neurological:  Negative for seizures, syncope, weakness, numbness and headaches.  Psychiatric/Behavioral:  Negative for confusion and dysphoric mood.    Objective:    Vitals with BMI 11/10/2020 11/03/2020 11/03/2020  Height _0  - -  Weight 169 lbs 6 oz - -  BMI 10.96 - -  Systolic 045 409 811  Diastolic 70 87 93  Pulse 914 91 92    BP 102/70   Pulse (!) 118   Ht _1  (1.727 m)   Wt 169 lb 6.4 oz (76.8 kg)   BMI 25.76 kg/m   Wt Readings from Last 3 Encounters:  11/10/20 169 lb 6.4 oz (76.8 kg)  11/03/20 159 lb (72.1 kg)  11/03/20 159 lb (72.1 kg)     Physical Exam Constitutional:      General: He is not in acute distress.    Appearance: He is well-developed.  HENT:     Head: Normocephalic and atraumatic.  Neck:     Thyroid: No thyromegaly.     Trachea: No tracheal deviation.  Cardiovascular:     Rate and Rhythm: Normal rate.     Pulses:          Dorsalis pedis pulses are 1+ on the right side and 1+ on the left side.       Posterior tibial pulses are 1+ on the right side and 1+ on the left side.     Heart sounds: Normal heart sounds, S1 normal and S2 normal. No murmur heard.   No gallop.  Pulmonary:     Effort: Pulmonary effort is normal. No respiratory distress.     Breath sounds: Normal breath sounds. No  wheezing.  Abdominal:     General: There is no distension.     Tenderness: There is no abdominal tenderness. There is no guarding.  Musculoskeletal:     Right shoulder: No swelling or deformity.     Cervical back: Normal range of motion and neck supple.  Skin:    General: Skin is warm and dry.     Findings: No rash.     Nails: There is no clubbing.  Neurological:     Mental Status: He is alert and oriented to person, place, and time.     Cranial Nerves: No cranial nerve deficit.     Sensory: No sensory deficit.     Gait: Gait normal.     Deep Tendon Reflexes: Reflexes are normal and symmetric.  Psychiatric:        Speech: Speech normal.        Behavior: Behavior normal. Behavior is cooperative.        Thought Content: Thought content normal.        Judgment: Judgment normal.     CMP     Component Value Date/Time   NA 124 (L) 11/03/2020 1443   K 4.9 11/03/2020 1443   CL 91 (L) 11/03/2020 1443   CO2 23 11/03/2020 1443   GLUCOSE 705 (HH) 11/03/2020 1443   BUN 17 11/03/2020 1443   CREATININE 1.43 (H) 11/03/2020 1443   CREATININE 1.05 01/23/2019 0927   CALCIUM 8.1 (L) 11/03/2020 1443   PROT 6.6 11/03/2020 1443   ALBUMIN 3.3 (L) 11/03/2020 1443   AST 20 11/03/2020 1443   ALT 22 11/03/2020  1443   ALKPHOS 102 11/03/2020 1443   BILITOT 0.6 11/03/2020 1443   GFRNONAA 59 (L) 11/03/2020 1443   GFRNONAA 83 01/23/2019 0927   GFRAA >60 01/11/2020 0859   GFRAA 96 01/23/2019 0927     Diabetic Labs (most recent): Lab Results  Component Value Date   HGBA1C >15.5 (H) 09/03/2020   HGBA1C 14.6 (A) 05/08/2020   HGBA1C 14.6 (H) 01/11/2020     Lipid Panel ( most recent) Lipid Panel     Component Value Date/Time   CHOL 277 (H) 11/03/2020 1139   TRIG 413 (H) 11/03/2020 1139   HDL 39 (L) 11/03/2020 1139   CHOLHDL 7.1 11/03/2020 1139   VLDL UNABLE TO CALCULATE IF TRIGLYCERIDE OVER 400 mg/dL 11/03/2020 1139   LDLCALC UNABLE TO CALCULATE IF TRIGLYCERIDE OVER 400 mg/dL 11/03/2020  1139   LDLDIRECT 181.3 (H) 11/03/2020 1139      Lab Results  Component Value Date   TSH 1.772 05/24/2017   TSH 4.778 (H) 11/25/2015     Assessment & Plan:   1. Uncontrolled type 2 diabetes mellitus with hyperglycemia ([place])   - [name] [name] has currently uncontrolled symptomatic type 2 DM since  50 years of age. Recent labs reviewed.  He admits that he has not followed the recommendations given to him last visit.  However, most recently he made some changes in his diet.  He presents with a log showing fasting blood glucose between 72-170, postprandial blood glucose ranging between 113-255.  He did not document any hypoglycemia.  His recent A1c was greater than 15%.  He lost coverage for his CGM.  He is using glucometer to monitor blood glucose multiple times a day.    - I had a long discussion with him about the progressive nature of diabetes and the pathology behind its complications. -his diabetes is complicated by coronary artery disease and he remains at a high risk for more acute and chronic complications which include CAD, CVA, CKD, retinopathy, and neuropathy. These are all discussed in detail with him.  - I have counseled him on diet  and weight management  by adopting a carbohydrate restricted/protein rich diet. Patient is encouraged to switch to  unprocessed or minimally processed     complex starch and increased protein intake (animal or plant source), fruits, and vegetables. -  he is advised to stick to a routine mealtimes to eat 3 meals  a day and avoid unnecessary snacks ( to snack only to correct hypoglycemia).   - he acknowledges that there is a room for improvement in his food and drink choices, consumes large quantities of sweetened tea. - Suggestion is made for him to avoid simple carbohydrates  from his diet including Cakes, Sweet Desserts, Ice Cream, Soda (diet and regular), Sweet Tea, Candies, Chips, Cookies, Store Bought Juices, Alcohol in Excess of  1-2 drinks a  day, Artificial Sweeteners,  Coffee Creamer, and "Sugar-free" Products, Lemonade. This will help patient to have more stable blood glucose profile and potentially avoid unintended weight gain.   - he has been scheduled with [name] [name], RDN, CDE for diabetes education.  - I have approached him with the following individualized plan to manage  his diabetes and patient agrees:   -In light of his current and prevailing glycemic burden, he will continue to require intensive treatment with basal/bolus insulin in order for him to maintain control of diabetes to target.    -He presents with a log x1 week showing better engagement for monitoring  of blood glucose.  -I commended him for this engagement since this will allow for optimal treatment of his diabetes with optimal dose of insulin. -In light of tightening fasting glycemic profile, he is advised to lower his Lantus to 60 units nightly, continue NovoLog 14-units 3 times a day with meals  for pre-meal BG readings of 90-138m/dl, plus patient specific correction dose for unexpected hyperglycemia above 1578mdl, associated with strict monitoring of glucose 4 times a day-before meals and at bedtime. - he is warned not to take insulin without proper monitoring per orders.  - Adjustment parameters are given to him for hypo and hyperglycemia in writing. - he is encouraged to call clinic for blood glucose levels less than 70 or above 300 mg /dl. -He is advised to resume metformin 500 mg p.o. twice daily-daily after breakfast and after supper. -He may also benefit from low-dose glipizide.  I discussed and added glipizide 5 mg XL p.o. daily at breakfast. - he is not a candidate for incretin therapy due to his current heavy smoking, at risk for pancreatitis.    - Specific targets for  A1c;  LDL, HDL,  and Triglycerides were discussed with the patient.  2) Blood Pressure /Hypertension:  -His blood pressure is controlled to target.  he is advised to  continue his current medications including Imdur 30 mg p.o. daily with breakfast .  3) Lipids/Hyperlipidemia:   Review of his recent lipid panel showed un controlled  LDL at 126 .  he  is advised to continue atorvastatin 80 mg p.o. nightly.   Side effects and precautions discussed with him.  Plant dominant, [employer] lifestyle nutrition is discussed and recommended to him.  4)  Weight/Diet:  Body mass index is 25.76 kg/m.  -  he is not a candidate for major weight loss. I discussed with him the fact that loss of 5 - 10% of his  current body weight will have the most impact on his diabetes management.  Exercise, and detailed carbohydrates information provided  -  detailed on discharge instructions.  5) Chronic Care/Health Maintenance:  -he  is on Statin medications and  is encouraged to initiate and continue to follow up with Ophthalmology, Dentist,  Podiatrist at least yearly or according to recommendations, and advised to  quit smoking. I have recommended yearly flu vaccine and pneumonia vaccine at least every 5 years; moderate intensity exercise for up to 150 minutes weekly; and  sleep for at least 7 hours a day.   The patient was counseled on the dangers of tobacco use, and was advised to quit.  Reviewed strategies to maximize success, including removing cigarettes and smoking materials from environment.  He had normal ABI on May 08, 2020.  This study will be repeated in December 2026, or sooner if needed.  - he is  advised to maintain close follow up with McSoyla DryerPA-C for primary care needs, as well as his other providers for optimal and coordinated care.     I spent 45 minutes in the care of the patient today including review of labs from [facility], Thyroid Function, Hematology (current and previous including abstractions from other facilities); face-to-face time discussing  his blood glucose readings/logs, discussing hypoglycemia and hyperglycemia episodes and  symptoms, medications doses, his options of short and long term treatment based on the latest standards of care / guidelines;  discussion about incorporating lifestyle medicine;  and documenting the encounter.    Please refer to Patient Instructions for Blood Glucose  Monitoring and Insulin/Medications Dosing Guide"  in media tab for additional information. Please  also refer to " Patient Self Inventory" in the Media  tab for reviewed elements of pertinent patient history.  [name] [name] participated in the discussions, expressed understanding, and voiced agreement with the above plans.  All questions were answered to his satisfaction. he is encouraged to contact clinic should he have any questions or concerns prior to his return visit.    Follow up plan: Return in 7 days for reevaluation. Glade Lloyd, MD [facility] [address] [place], [place] [address] Phone: [phone]  Fax: [phone]    11/10/2020, 12:53 PM  This note was partially dictated with voice recognition software. Similar sounding words can be transcribed inadequately or may not  be corrected upon review.

## 2020-11-10 NOTE — Patient Instructions (Signed)

## 2020-11-11 ENCOUNTER — Emergency Department (HOSPITAL_COMMUNITY)
Admission: EM | Admit: 2020-11-11 | Discharge: 2020-11-11 | Disposition: A | Discharge status: Home / Self Care | Payer: Self-pay | Attending: Emergency Medicine | Admitting: Emergency Medicine

## 2020-11-11 ENCOUNTER — Other Ambulatory Visit: Payer: Self-pay

## 2020-11-11 ENCOUNTER — Emergency Department (HOSPITAL_COMMUNITY): Payer: Self-pay

## 2020-11-11 ENCOUNTER — Encounter (HOSPITAL_COMMUNITY)
Admission: RE | Admit: 2020-11-11 | Discharge: 2020-11-11 | Disposition: A | Discharge status: Home / Self Care | Payer: Self-pay | Source: Ambulatory Visit | Attending: Urology | Admitting: Urology

## 2020-11-11 ENCOUNTER — Encounter (HOSPITAL_COMMUNITY): Payer: Self-pay

## 2020-11-11 ENCOUNTER — Other Ambulatory Visit (HOSPITAL_COMMUNITY): Payer: MEDICAID | Attending: Urology

## 2020-11-11 ENCOUNTER — Telehealth: Payer: Self-pay

## 2020-11-11 ENCOUNTER — Encounter (HOSPITAL_COMMUNITY): Payer: Self-pay | Admitting: Emergency Medicine

## 2020-11-11 DIAGNOSIS — F17210 Nicotine dependence, cigarettes, uncomplicated: Secondary | ICD-10-CM | POA: Insufficient documentation

## 2020-11-11 DIAGNOSIS — Z01812 Encounter for preprocedural laboratory examination: Secondary | ICD-10-CM | POA: Insufficient documentation

## 2020-11-11 DIAGNOSIS — I2510 Atherosclerotic heart disease of native coronary artery without angina pectoris: Secondary | ICD-10-CM | POA: Insufficient documentation

## 2020-11-11 DIAGNOSIS — Z7984 Long term (current) use of oral hypoglycemic drugs: Secondary | ICD-10-CM | POA: Insufficient documentation

## 2020-11-11 DIAGNOSIS — J449 Chronic obstructive pulmonary disease, unspecified: Secondary | ICD-10-CM | POA: Insufficient documentation

## 2020-11-11 DIAGNOSIS — K5900 Constipation, unspecified: Secondary | ICD-10-CM | POA: Insufficient documentation

## 2020-11-11 DIAGNOSIS — N410 Acute prostatitis: Secondary | ICD-10-CM | POA: Insufficient documentation

## 2020-11-11 DIAGNOSIS — Z79899 Other long term (current) drug therapy: Secondary | ICD-10-CM | POA: Insufficient documentation

## 2020-11-11 DIAGNOSIS — Z7982 Long term (current) use of aspirin: Secondary | ICD-10-CM | POA: Insufficient documentation

## 2020-11-11 DIAGNOSIS — Z794 Long term (current) use of insulin: Secondary | ICD-10-CM | POA: Insufficient documentation

## 2020-11-11 DIAGNOSIS — Z7952 Long term (current) use of systemic steroids: Secondary | ICD-10-CM | POA: Insufficient documentation

## 2020-11-11 DIAGNOSIS — K219 Gastro-esophageal reflux disease without esophagitis: Secondary | ICD-10-CM | POA: Insufficient documentation

## 2020-11-11 DIAGNOSIS — I10 Essential (primary) hypertension: Secondary | ICD-10-CM | POA: Insufficient documentation

## 2020-11-11 DIAGNOSIS — E1140 Type 2 diabetes mellitus with diabetic neuropathy, unspecified: Secondary | ICD-10-CM | POA: Insufficient documentation

## 2020-11-11 LAB — CBC WITH DIFFERENTIAL/PLATELET
Abs Immature Granulocytes: 0.02 10*3/uL (ref 0.00–0.07)
Basophils Absolute: 0.1 10*3/uL (ref 0.0–0.1)
Basophils Relative: 1 %
Eosinophils Absolute: 0.2 10*3/uL (ref 0.0–0.5)
Eosinophils Relative: 3 %
HCT: 45.3 % (ref 39.0–52.0)
Hemoglobin: 15.2 g/dL (ref 13.0–17.0)
Immature Granulocytes: 0 %
Lymphocytes Relative: 25 %
Lymphs Abs: 1.8 10*3/uL (ref 0.7–4.0)
MCH: 31.7 pg (ref 26.0–34.0)
MCHC: 33.6 g/dL (ref 30.0–36.0)
MCV: 94.4 fL (ref 80.0–100.0)
Monocytes Absolute: 0.9 10*3/uL (ref 0.1–1.0)
Monocytes Relative: 13 %
Neutro Abs: 4.1 10*3/uL (ref 1.7–7.7)
Neutrophils Relative %: 58 %
Platelets: 200 10*3/uL (ref 150–400)
RBC: 4.8 MIL/uL (ref 4.22–5.81)
RDW: 13.1 % (ref 11.5–15.5)
WBC: 7.1 10*3/uL (ref 4.0–10.5)
nRBC: 0 % (ref 0.0–0.2)

## 2020-11-11 LAB — BASIC METABOLIC PANEL
Anion gap: 10 (ref 5–15)
BUN: 15 mg/dL (ref 6–20)
CO2: 23 mmol/L (ref 22–32)
Calcium: 8.2 mg/dL — ABNORMAL LOW (ref 8.9–10.3)
Chloride: 102 mmol/L (ref 98–111)
Creatinine, Ser: 1.09 mg/dL (ref 0.61–1.24)
GFR, Estimated: >60 mL/min (ref >60)
Glucose, Bld: 323 mg/dL — ABNORMAL HIGH (ref 70–99)
Potassium: 4.4 mmol/L (ref 3.5–5.1)
Sodium: 135 mmol/L (ref 135–145)

## 2020-11-11 LAB — URINALYSIS, ROUTINE W REFLEX MICROSCOPIC
Bilirubin Urine: NEGATIVE
Glucose, UA: >=500 mg/dL — AB
Ketones, ur: NEGATIVE mg/dL
Leukocytes,Ua: NEGATIVE
Nitrite: NEGATIVE
Protein, ur: 100 mg/dL — AB
Specific Gravity, Urine: 1.015 (ref 1.005–1.030)
pH: 6 (ref 5.0–8.0)

## 2020-11-11 LAB — COMPREHENSIVE METABOLIC PANEL
ALT: 16 U/L (ref 0–44)
AST: 21 U/L (ref 15–41)
Albumin: 3.1 g/dL — ABNORMAL LOW (ref 3.5–5.0)
Alkaline Phosphatase: 78 U/L (ref 38–126)
Anion gap: 8 (ref 5–15)
BUN: 14 mg/dL (ref 6–20)
CO2: 24 mmol/L (ref 22–32)
Calcium: 8.3 mg/dL — ABNORMAL LOW (ref 8.9–10.3)
Chloride: 101 mmol/L (ref 98–111)
Creatinine, Ser: 1.1 mg/dL (ref 0.61–1.24)
GFR, Estimated: >60 mL/min (ref >60)
Glucose, Bld: 335 mg/dL — ABNORMAL HIGH (ref 70–99)
Potassium: 4.3 mmol/L (ref 3.5–5.1)
Sodium: 133 mmol/L — ABNORMAL LOW (ref 135–145)
Total Bilirubin: 0.3 mg/dL (ref 0.3–1.2)
Total Protein: 6.5 g/dL (ref 6.5–8.1)

## 2020-11-11 LAB — LACTIC ACID, PLASMA: Lactic Acid, Venous: 1.4 mmol/L (ref 0.5–1.9)

## 2020-11-11 LAB — GLUCOSE, CAPILLARY: Glucose-Capillary: 333 mg/dL — ABNORMAL HIGH (ref 70–99)

## 2020-11-11 MED ORDER — POLYETHYLENE GLYCOL 3350 17 G PO PACK: 17.0000 g | PACK | Freq: Every day | ORAL | 0 refills | Status: DC

## 2020-11-11 MED ORDER — IOHEXOL 300 MG/ML  SOLN
100.0000 mL | Freq: Once | INTRAMUSCULAR | Status: AC | PRN
  Administered 2020-11-11: 100 mL via INTRAVENOUS

## 2020-11-11 MED ORDER — FENTANYL CITRATE (PF) 100 MCG/2ML IJ SOLN
50.0000 ug | Freq: Once | INTRAMUSCULAR | Status: AC
  Administered 2020-11-11: 50 ug via INTRAVENOUS
  Filled 2020-11-11: qty 2

## 2020-11-11 MED ORDER — SODIUM CHLORIDE 0.9 % IV BOLUS
1000.0000 mL | Freq: Once | INTRAVENOUS | Status: AC
  Administered 2020-11-11: 1000 mL via INTRAVENOUS

## 2020-11-11 MED ORDER — SODIUM CHLORIDE 0.9 % IV BOLUS
500.0000 mL | Freq: Once | INTRAVENOUS | Status: AC
  Administered 2020-11-11: 500 mL via INTRAVENOUS

## 2020-11-11 MED ORDER — SODIUM CHLORIDE 0.9 % IV SOLN
2.0000 g | Freq: Once | INTRAVENOUS | Status: AC
  Administered 2020-11-11: 2 g via INTRAVENOUS
  Filled 2020-11-11: qty 20

## 2020-11-11 MED ORDER — CEFDINIR 300 MG PO CAPS: 300.0000 mg | ORAL_CAPSULE | Freq: Two times a day (BID) | ORAL | 0 refills | Status: DC

## 2020-11-11 NOTE — ED Provider Notes (Signed)
[facility] EMERGENCY DEPARTMENT Provider Note   CSN: [id number] Arrival date & time: 11/11/20  0142     History Chief Complaint  Patient presents with   Abdominal Pain    [name] [name] is a 50 y.o. male.  Patient presents to the emergency department for evaluation of abdominal pain.  Patient reports that he has been experiencing lower abdominal and back pain for approximately 1 week.  He reports that he feels like he can have a bowel movement normally.  Patient has not had any fever, nausea or vomiting.  He has noticed some increased urination.  No dysuria.      Past Medical History:  Diagnosis Date   Anxiety    Arthritis    CAD (coronary artery disease)    Moderate LAD disease 2016 - Dr. Einar Gip   Chronic bronchitis [facility])    Chronic upper back pain    COPD (chronic obstructive pulmonary disease) ([place])    Depression    Diabetic peripheral neuropathy (HCC)    GERD (gastroesophageal reflux disease)    History of gout    Hyperlipemia    Hypertension    Migraine    Noncompliance    Ringing in the ears, bilateral    Sleep apnea 2016   "could not afford CPAP".   Type 2 diabetes mellitus [facility])     Patient Active Problem List   Diagnosis Date Noted   Severe sepsis ([place]) 09/03/2020   Personal history of noncompliance with medical treatment, presenting hazards to health 05/27/2020   Barrett's esophagus 01/23/2019   Diarrhea 01/23/2019   MDD (major depressive disorder), severe ([place]) 12/17/2018   DKA, type 2 ([place]) 11/07/2018   High anion gap metabolic acidosis 10/29/5613   Epigastric abdominal pain 03/22/2018   Nausea vomiting and diarrhea 03/22/2018   AKI (acute kidney injury) ([place]) 03/22/2018   Uncontrolled type 2 diabetes mellitus with hyperglycemia ([place]) 03/22/2018   Diabetic gastroparesis ([place]) 06/02/2017   ARF (acute renal failure) ([place]) 05/30/2017   Mixed hyperlipidemia 03/31/2017   Diabetic polyneuropathy ([place]) 03/31/2017   Vitamin B12 deficiency 03/31/2017    Vitamin D deficiency 03/31/2017   Right bundle branch block 02/14/2017   DKA (diabetic ketoacidoses) 10/15/2016   Gastroesophageal reflux disease 10/15/2016   Benign hypertension 10/15/2016   Leukocytosis 11/25/2015   Intractable nausea and vomiting 11/25/2015   Type 2 diabetes mellitus with hyperglycemia, with long-term current use of insulin ([place]) 11/25/2015   Dental abscess 11/25/2015   Nausea and vomiting 11/25/2015   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 ([place]) 05/02/2015   Coronary arteriosclerosis 03/28/2015   Chest pain 03/27/2015   Anxiety disorder 02/27/2015   Smoking 02/27/2015   Obesity 02/27/2015   Obstructive sleep apnea 02/27/2015    Past Surgical History:  Procedure Laterality Date   ANKLE SURGERY Right 1982   "had extra bones in there; took them out"   [place]  12/26/2018   Procedure: BIOPSY;  Surgeon: Rogene Houston, MD;  [facility];  Service: Endoscopy;;  duodenal biopsies   BIOPSY  03/23/2019   Procedure: BIOPSY;  Surgeon: Rogene Houston, MD;  [facility];  Service: Endoscopy;;  esophagus   CARDIAC CATHETERIZATION N/A 03/28/2015   Procedure: Left Heart Cath and Coronary Angiography;  Surgeon: Adrian Prows, MD;  Location: [place] CV LAB;  Service: Cardiovascular;  Laterality: N/A;   CARDIAC CATHETERIZATION N/A 03/28/2015   Procedure: Intravascular Pressure Wire/FFR Study;  Surgeon: Adrian Prows,  MD;  Location: [place] CV LAB;  Service: Cardiovascular;  Laterality: N/A;   CARPAL TUNNEL RELEASE Left ~ 2008   COLONOSCOPY WITH ESOPHAGOGASTRODUODENOSCOPY (EGD)     COLONOSCOPY WITH PROPOFOL N/A 12/24/2019   Procedure: COLONOSCOPY WITH PROPOFOL;  Surgeon: Rogene Houston, MD;  [facility];  Service: Endoscopy;  Laterality: N/A;  955   ELBOW FRACTURE SURGERY Left ~ 2008   ESOPHAGOGASTRODUODENOSCOPY N/A 12/26/2018   Procedure: ESOPHAGOGASTRODUODENOSCOPY (EGD);  Surgeon:  Rogene Houston, MD;  [facility];  Service: Endoscopy;  Laterality: N/A;   ESOPHAGOGASTRODUODENOSCOPY (EGD) WITH PROPOFOL N/A 03/23/2019   Procedure: ESOPHAGOGASTRODUODENOSCOPY (EGD) WITH PROPOFOL;  Surgeon: Rogene Houston, MD;  [facility];  Service: Endoscopy;  Laterality: N/A;  7:30   FRACTURE SURGERY     ankle and elbow   PILONIDAL CYST EXCISION N/A 03/24/2017   Procedure: EXCISION CHRONIC  PILONIDAL ABSCESS;  Surgeon: Coralie Keens, MD;  Location: [facility] OR[facility];  Service: General;  Laterality: N/A;   TENDON REPAIR Left ~ 2004   "main tendon in my ankle"       Family History  Problem Relation Age of Onset   Congestive Heart Failure Sister    Diabetes Mother    Hypertension Mother    Cancer Mother    Diabetes Father    Hypertension Father    Hyperlipidemia Father     Social History   Tobacco Use   Smoking status: Every Day    Packs/day: 0.50    Years: 34.00    Pack years: 17.00    Types: Cigarettes   Smokeless tobacco: Never   Tobacco comments:    1/2 pack a day  Vaping Use   Vaping Use: Never used  Substance Use Topics   Alcohol use: Never    Alcohol/week: 0.0 standard drinks   Drug use: Never    Home Medications Prior to Admission medications   Medication Sig Start Date End Date Taking? Authorizing Provider  cefdinir (OMNICEF) 300 MG capsule Take 1 capsule (300 mg total) by mouth 2 (two) times daily. 11/11/20  Yes , Gwenyth Allegra, MD  polyethylene glycol (MIRALAX / GLYCOLAX) 17 g packet Take 17 g by mouth daily. 11/11/20  Yes , Gwenyth Allegra, MD  albuterol (PROVENTIL HFA) 108 (90 Base) MCG/ACT inhaler INHALE 2 PUFFS BY MOUTH EVERY 6 HOURS AS NEEDED FOR COUGHING, WHEEZING, OR SHORTNESS OF BREATH 09/22/20   Soyla Dryer, PA-C  ARIPiprazole (ABILIFY) 5 MG tablet Take 5 mg by mouth daily.    [provider]  ASPIRIN LOW DOSE 81 MG EC tablet Take 81 mg by mouth every morning. 09/05/20   [provider]   atorvastatin (LIPITOR) 80 MG tablet TAKE 1 Tablet BY MOUTH ONCE EVERY DAY Patient taking differently: Take 80 mg by mouth daily. 09/22/20   Soyla Dryer, PA-C  Blood Glucose Monitoring Suppl (ACCU-CHEK GUIDE ME) w/Device KIT 1 Piece by Does not apply route as directed. 11/10/20   Cassandria Anger, MD  doxycycline (VIBRAMYCIN) 100 MG capsule Take 1 capsule (100 mg total) by mouth 2 (two) times daily. 10/07/20   McKenzie, Candee Furbish, MD  famotidine (PEPCID) 40 MG tablet Take 1 tablet (40 mg total) by mouth 2 (two) times daily. 10/16/20   Harvel Quale, MD  Fluticasone-Salmeterol (ADVAIR DISKUS IN) Inhale 1 puff into the lungs daily. Rinse mouth after use    [provider]  glipiZIDE (GLUCOTROL XL) 5 MG 24 hr tablet Take 1 tablet (5 mg total)  by mouth daily with breakfast. 11/10/20   Cassandria Anger, MD  glucose blood (ACCU-CHEK GUIDE) test strip Use to test glucose 4 times a day 11/10/20   Cassandria Anger, MD  insulin aspart (NOVOLOG) 100 UNIT/ML injection Inject 14-20 Units into the skin 3 (three) times daily before meals. 05/08/20   Cassandria Anger, MD  insulin glargine (LANTUS SOLOSTAR) 100 UNIT/ML Solostar Pen Inject 60 Units into the skin at bedtime. 11/10/20   Cassandria Anger, MD  metFORMIN (GLUCOPHAGE) 500 MG tablet Take 1 tablet (500 mg total) by mouth 2 (two) times daily with a meal. 11/10/20   Nida, Marella Chimes, MD  metoCLOPramide (REGLAN) 5 MG tablet Take 1 tablet (5 mg total) by mouth 4 (four) times daily -  before meals and at bedtime. 11/22/19   Ezzard Standing, PA-C  nitroGLYCERIN (NITROSTAT) 0.4 MG SL tablet Place 1 tablet (0.4 mg total) under the tongue every 5 (five) minutes as needed for chest pain. 10/05/17   Soyla Dryer, PA-C  promethazine (PHENERGAN) 25 MG suppository Place 1 suppository (25 mg total) rectally every 8 (eight) hours as needed for refractory nausea / vomiting. 12/20/18   Barton Dubois, MD  sertraline (ZOLOFT) 100  MG tablet Take 1 tablet by mouth daily. 01/09/20   [provider]  traZODone (DESYREL) 50 MG tablet Take 50 mg by mouth at bedtime. Patient not taking: No sig reported 10/03/20   [provider]    Allergies    Omeprazole magnesium, Bee venom, Esomeprazole, and Morphine and related  Review of Systems   Review of Systems  Gastrointestinal:  Positive for abdominal pain and constipation.  Genitourinary:  Positive for frequency.  All other systems reviewed and are negative.  Physical Exam Updated Vital Signs BP (!) 168/92   Pulse 99   Temp 99.8 F (37.7 C) (Oral)   Resp 18   Ht 5' 8" (1.727 m)   Wt 77 kg   SpO2 95%   BMI 25.81 kg/m   Physical Exam Vitals and nursing note reviewed. Exam conducted with a chaperone present.  Constitutional:      General: He is not in acute distress.    Appearance: Normal appearance. He is well-developed.  HENT:     Head: Normocephalic and atraumatic.     Right Ear: Hearing normal.     Left Ear: Hearing normal.     Nose: Nose normal.  Eyes:     Conjunctiva/sclera: Conjunctivae normal.     Pupils: Pupils are equal, round, and reactive to light.  Cardiovascular:     Rate and Rhythm: Regular rhythm.     Heart sounds: S1 normal and S2 normal. No murmur heard.   No friction rub. No gallop.  Pulmonary:     Effort: Pulmonary effort is normal. No respiratory distress.     Breath sounds: Normal breath sounds.  Chest:     Chest wall: No tenderness.  Abdominal:     General: Bowel sounds are normal.     Palpations: Abdomen is soft.     Tenderness: There is abdominal tenderness in the right lower quadrant, suprapubic area and left lower quadrant. There is no guarding or rebound. Negative signs include Murphy's sign and McBurney's sign.     Hernia: No hernia is present.  Genitourinary:    Prostate: Enlarged and tender.  Musculoskeletal:        General: Normal range of motion.     Cervical back: Normal range of motion and neck  supple.  Skin:    General: Skin is warm and dry.     Findings: No rash.  Neurological:     Mental Status: He is alert and oriented to person, place, and time.     GCS: GCS eye subscore is 4. GCS verbal subscore is 5. GCS motor subscore is 6.     Cranial Nerves: No cranial nerve deficit.     Sensory: No sensory deficit.     Coordination: Coordination normal.  Psychiatric:        Speech: Speech normal.        Behavior: Behavior normal.        Thought Content: Thought content normal.    ED Results / Procedures / Treatments   Labs (all labs ordered are listed, but only abnormal results are displayed) Labs Reviewed  COMPREHENSIVE METABOLIC PANEL - Abnormal; Notable for the following components:      Result Value   Sodium 133 (*)    Glucose, Bld 335 (*)    Calcium 8.3 (*)    Albumin 3.1 (*)    All other components within normal limits  URINALYSIS, ROUTINE W REFLEX MICROSCOPIC - Abnormal; Notable for the following components:   Color, Urine STRAW (*)    Glucose, UA >=500 (*)    Hgb urine dipstick MODERATE (*)    Protein, ur 100 (*)    Bacteria, UA RARE (*)    All other components within normal limits  URINE CULTURE  CBC WITH DIFFERENTIAL/PLATELET  LACTIC ACID, PLASMA    EKG None  Radiology CT ABDOMEN PELVIS W CONTRAST  Result Date: 11/11/2020 CLINICAL DATA:  Low back and abdominal pain since 11/03/2020 EXAM: CT ABDOMEN AND PELVIS WITH CONTRAST TECHNIQUE: Multidetector CT imaging of the abdomen and pelvis was performed using the standard protocol following bolus administration of intravenous contrast. CONTRAST:  164m OMNIPAQUE IOHEXOL 300 MG/ML  SOLN COMPARISON:  CT 11/07/2018 FINDINGS: Lower chest: Lung bases are clear. Normal heart size. No pericardial effusion. Coronary artery calcifications. Hepatobiliary: No worrisome focal liver lesions. Smooth liver surface contour. Normal hepatic attenuation. Normal gallbladder and biliary tree. Pancreas: No pancreatic ductal dilatation  or surrounding inflammatory changes. Spleen: Normal in size. No concerning splenic lesions. Adrenals/Urinary Tract: Normal adrenal glands. Kidneys are normally located with symmetric enhancement and excretion. No suspicious renal lesion, urolithiasis or hydronephrosis. Circumferential bladder wall thickening. Stomach/Bowel: Mild thickening of the distal thoracic esophagus. No paraesophageal fluid or gas. Stomach and duodenum are unremarkable. No small bowel thickening or dilatation. Appendix is not visualized. No focal inflammation the vicinity of the cecum to suggest an occult appendicitis. No colonic dilatation or wall thickening. Moderate colonic stool burden. Vascular/Lymphatic: Atherosclerotic calcifications within the abdominal aorta and branch vessels. No aneurysm or ectasia. No enlarged abdominopelvic lymph nodes. Reproductive: The prostate and seminal vesicles are unremarkable. Other: Soft tissue stranding and skin thickening along the anterior abdominal wall likely related to injectable insulin usage. No abdominopelvic free fluid or free gas. No bowel containing hernias. Musculoskeletal: No acute osseous abnormality or suspicious osseous lesion. Multilevel degenerative changes are present in the imaged portions of the spine. Additional degenerative changes in the hips and pelvis. IMPRESSION: Circumferential bladder wall thickening, recommend assessment for urinary symptoms and urinalysis to assess for cystitis. Mild thickening in the distal thoracic esophagus, correlate for features of esophagitis. Notably diminished from comparison prior. Moderate colonic stool burden; correlate for slowed intestinal transit/constipation. No high-grade obstruction. Electronically Signed   By: PLovena LeM.D.   On: 11/11/2020 04:04  Procedures Procedures   Medications Ordered in ED Medications  sodium chloride 0.9 % bolus 500 mL (0 mLs Intravenous Stopped 11/11/20 0325)  iohexol (OMNIPAQUE) 300 MG/ML solution  100 mL (100 mLs Intravenous Contrast Given 11/11/20 0340)  fentaNYL (SUBLIMAZE) injection 50 mcg (50 mcg Intravenous Given 11/11/20 0422)  cefTRIAXone (ROCEPHIN) 2 g in sodium chloride 0.9 % 100 mL IVPB (0 g Intravenous Stopped 11/11/20 0531)  sodium chloride 0.9 % bolus 1,000 mL (1,000 mLs Intravenous New Bag/Given 11/11/20 0453)    ED Course  I have reviewed the triage vital signs and the nursing notes.  Pertinent labs & imaging results that were available during my care of the patient were reviewed by me and considered in my medical decision making (see chart for details).    MDM Rules/Calculators/A&P                          Patient presents to the emergency department for evaluation of lower abdominal and back pain.  He reports that he has felt like he cannot have a bowel movement for the last 6 or 7 days.  Basic labs were unremarkable.  This includes a urinalysis that did not show obvious infection.  CT scan was performed.  He does have circumferential thickening of the bladder.  No other acute abnormality.  He does have a moderate stool burden.  Patient seemed uncomfortable.  He was noted to be mildly tachycardic.  Lactic acid was added on.  It was normal.  He does not have a leukocytosis.  No fever.  Based on the CT findings, urinary infection is considered.  A rectal exam was therefore performed.  Patient does have an enlarged and tender prostate.  Will cover for prostatitis.  Patient with improved blood pressure and heart rate after fluids and analgesia.  Does not require hospitalization.  He is scheduled to have a urologic procedure later in the week, will call his urologist this morning for follow-up.  Final Clinical Impression(s) / ED Diagnoses Final diagnoses:  Acute prostatitis  Constipation, unspecified constipation type    Rx / DC Orders ED Discharge Orders          Ordered    cefdinir (OMNICEF) 300 MG capsule  2 times daily        11/11/20 0532    polyethylene glycol  (MIRALAX / GLYCOLAX) 17 g packet  Daily        11/11/20 0532             Orpah Greek, MD 11/11/20 [phone]

## 2020-11-11 NOTE — ED Triage Notes (Signed)
Pt c/o lower back/abd pain since 6/6. Pt states it is hard to "dookey".

## 2020-11-11 NOTE — Telephone Encounter (Signed)
----- Message from Shellia Cleverly, DO sent at 11/11/2020  2:47 PM EDT ----- Regarding: RE: TIF candidacy [name] [name],  That is great news for him! Certainly opens up his surgical options. I would be happy to meet with him to discuss TIF. Thanks for the follow-up and reaching back out!  [name] [name],  Can you please assist in setting up OV with me to discuss possible TIF? Thanks.   ----- Message ----- From: Dolores Frame, MD Sent: 11/10/2020   5:12 PM EDT To: Shellia Cleverly, DO Subject: FW: TIF candidacy                              Hi Vito, So I performed the gastric emptying study off Reglan and it was normal.  Do think that he will be a candidate for the TIF?  I can refer him to your clinic if so.  Thanks,  [name] [name] ----- Message ----- From: Marguerita Merles, [name] Boom, MD Sent: 10/22/2020  12:59 PM EDT To: Shellia Cleverly, DO Subject: RE: TIF candidacy                              Thanks for your response, unfortunately he was never formally diagnosed by Dr. Karilyn Cota, who used to take care of him, so he still has that clinical diagnosis. I tried reaching him to see if he would be interested in having a formal GES, but he did not pick up the phone. I'll keep you posted if there are any news.  Thanks,  [name] [name] ----- Message ----- From: Shellia Cleverly, DO Sent: 10/21/2020   1:05 PM EDT To: Dolores Frame, MD Subject: RE: TIF candidacy                              [name] [name],  Thanks for reaching out.  I just looked through his chart.  I am okay with antireflux surgery in patients with short segment nondysplastic Barrett's, and typically discussed with them the overall lack of literature re: what to do about medications and surveillance afterwards.  I generally keep him on acid suppression therapy then repeat a scope 1 year later.  If the Barrett's has completely regressed, can come off medications and may be due a surveillance 3 years later.  With that said, the  main concern I have for this gentleman is the gastroparesis.  That is a contraindication to TIF and most antireflux surgeries.  I do not see that he has this diagnosis by GES, but given clinical improvement with Reglan, you are probably right by clinical diagnosis.  So I would probably not offer TIF unless we wanted to go down the road of gastric emptying study off Reglan to prove or disprove the gastroparesis diagnosis.  Vito ----- Message ----- From: Dolores Frame, MD Sent: 10/17/2020   9:32 PM EDT To: Shellia Cleverly, DO Subject: TIF candidacy                                  Hi Vito, Hope you're good. Wanted to check what your thoughts are for possibly referring this patient to you for TIF. He had Barrett's in 2020, no dysplasia but was a short segment. He is intolerant to PPIs, and I just increased his famotidine  to twice a day dosing but I don't think it will help too much.  Please let me know your thoughts.  Katrinka Blazing, MD Gastroenterology and Hepatology [facility]

## 2020-11-11 NOTE — Telephone Encounter (Signed)
LVM for patient to call to schedule an appointment to discuss TIF

## 2020-11-11 NOTE — ED Notes (Signed)
Bladder scan 186ml

## 2020-11-12 LAB — URINE CULTURE: Culture: NO GROWTH

## 2020-11-12 LAB — HEMOGLOBIN A1C
Hgb A1c MFr Bld: 13.6 % — ABNORMAL HIGH (ref 4.8–5.6)
Mean Plasma Glucose: 344 mg/dL

## 2020-11-13 ENCOUNTER — Ambulatory Visit (HOSPITAL_COMMUNITY): Payer: Self-pay | Admitting: Certified Registered"

## 2020-11-13 ENCOUNTER — Ambulatory Visit (HOSPITAL_COMMUNITY)
Admission: RE | Admit: 2020-11-13 | Discharge: 2020-11-13 | Disposition: A | Discharge status: Home / Self Care | Payer: Self-pay | Attending: Urology | Admitting: Urology

## 2020-11-13 ENCOUNTER — Encounter (HOSPITAL_COMMUNITY): Payer: Self-pay | Admitting: Urology

## 2020-11-13 ENCOUNTER — Other Ambulatory Visit: Payer: Self-pay

## 2020-11-13 ENCOUNTER — Encounter (HOSPITAL_COMMUNITY)
Admission: RE | Disposition: A | Discharge status: Home / Self Care | Payer: Self-pay | Source: Home / Self Care | Attending: Urology

## 2020-11-13 DIAGNOSIS — L723 Sebaceous cyst: Secondary | ICD-10-CM

## 2020-11-13 DIAGNOSIS — F17210 Nicotine dependence, cigarettes, uncomplicated: Secondary | ICD-10-CM | POA: Insufficient documentation

## 2020-11-13 DIAGNOSIS — Z8249 Family history of ischemic heart disease and other diseases of the circulatory system: Secondary | ICD-10-CM | POA: Insufficient documentation

## 2020-11-13 DIAGNOSIS — E1142 Type 2 diabetes mellitus with diabetic polyneuropathy: Secondary | ICD-10-CM | POA: Insufficient documentation

## 2020-11-13 DIAGNOSIS — Z8349 Family history of other endocrine, nutritional and metabolic diseases: Secondary | ICD-10-CM | POA: Insufficient documentation

## 2020-11-13 DIAGNOSIS — Z91030 Bee allergy status: Secondary | ICD-10-CM | POA: Insufficient documentation

## 2020-11-13 DIAGNOSIS — Z888 Allergy status to other drugs, medicaments and biological substances status: Secondary | ICD-10-CM | POA: Insufficient documentation

## 2020-11-13 DIAGNOSIS — Z885 Allergy status to narcotic agent status: Secondary | ICD-10-CM | POA: Insufficient documentation

## 2020-11-13 DIAGNOSIS — Z833 Family history of diabetes mellitus: Secondary | ICD-10-CM | POA: Insufficient documentation

## 2020-11-13 HISTORY — PX: SCROTAL EXPLORATION: SHX2386

## 2020-11-13 LAB — GLUCOSE, CAPILLARY
Glucose-Capillary: 122 mg/dL — ABNORMAL HIGH (ref 70–99)
Glucose-Capillary: 95 mg/dL (ref 70–99)

## 2020-11-13 SURGERY — EXPLORATION, SCROTUM
Anesthesia: General | Site: Scrotum

## 2020-11-13 MED ORDER — ONDANSETRON HCL 4 MG/2ML IJ SOLN
INTRAMUSCULAR | Status: DC | PRN
  Administered 2020-11-13: 4 mg via INTRAVENOUS

## 2020-11-13 MED ORDER — KETOROLAC TROMETHAMINE 30 MG/ML IJ SOLN
INTRAMUSCULAR | Status: DC | PRN
  Administered 2020-11-13: 30 mg via INTRAVENOUS

## 2020-11-13 MED ORDER — CEFAZOLIN SODIUM-DEXTROSE 2-4 GM/100ML-% IV SOLN
2.0000 g | INTRAVENOUS | Status: AC
  Administered 2020-11-13: 2 g via INTRAVENOUS

## 2020-11-13 MED ORDER — PHENYLEPHRINE 40 MCG/ML (10ML) SYRINGE FOR IV PUSH (FOR BLOOD PRESSURE SUPPORT)
PREFILLED_SYRINGE | INTRAVENOUS | Status: DC | PRN
  Administered 2020-11-13: 80 ug via INTRAVENOUS

## 2020-11-13 MED ORDER — OXYCODONE-ACETAMINOPHEN 5-325 MG PO TABS: 1.0000 | ORAL_TABLET | ORAL | 0 refills | Status: DC | PRN

## 2020-11-13 MED ORDER — PROPOFOL 10 MG/ML IV BOLUS
INTRAVENOUS | Status: DC | PRN
  Administered 2020-11-13: 50 mg via INTRAVENOUS
  Administered 2020-11-13: 20 mg via INTRAVENOUS
  Administered 2020-11-13: 130 mg via INTRAVENOUS

## 2020-11-13 MED ORDER — FENTANYL CITRATE (PF) 100 MCG/2ML IJ SOLN: 25.0000 ug | INTRAMUSCULAR | Status: DC | PRN

## 2020-11-13 MED ORDER — ONDANSETRON HCL 4 MG/2ML IJ SOLN
INTRAMUSCULAR | Status: AC
  Filled 2020-11-13: qty 2

## 2020-11-13 MED ORDER — CEFAZOLIN SODIUM-DEXTROSE 2-4 GM/100ML-% IV SOLN
INTRAVENOUS | Status: AC
  Filled 2020-11-13: qty 100

## 2020-11-13 MED ORDER — PHENYLEPHRINE 40 MCG/ML (10ML) SYRINGE FOR IV PUSH (FOR BLOOD PRESSURE SUPPORT)
PREFILLED_SYRINGE | INTRAVENOUS | Status: AC
  Filled 2020-11-13: qty 10

## 2020-11-13 MED ORDER — DEXAMETHASONE SODIUM PHOSPHATE 10 MG/ML IJ SOLN
INTRAMUSCULAR | Status: AC
  Filled 2020-11-13: qty 1

## 2020-11-13 MED ORDER — FENTANYL CITRATE (PF) 100 MCG/2ML IJ SOLN
INTRAMUSCULAR | Status: DC | PRN
  Administered 2020-11-13 (×2): 50 ug via INTRAVENOUS

## 2020-11-13 MED ORDER — SUCCINYLCHOLINE CHLORIDE 200 MG/10ML IV SOSY
PREFILLED_SYRINGE | INTRAVENOUS | Status: DC | PRN
  Administered 2020-11-13: 100 mg via INTRAVENOUS

## 2020-11-13 MED ORDER — DEXAMETHASONE SODIUM PHOSPHATE 10 MG/ML IJ SOLN
INTRAMUSCULAR | Status: DC | PRN
  Administered 2020-11-13: 4 mg via INTRAVENOUS

## 2020-11-13 MED ORDER — PROPOFOL 10 MG/ML IV BOLUS
INTRAVENOUS | Status: AC
  Filled 2020-11-13: qty 20

## 2020-11-13 MED ORDER — CHLORHEXIDINE GLUCONATE 0.12 % MT SOLN: 15.0000 mL | Freq: Once | OROMUCOSAL | Status: DC

## 2020-11-13 MED ORDER — MIDAZOLAM HCL 2 MG/2ML IJ SOLN
INTRAMUSCULAR | Status: AC
  Filled 2020-11-13: qty 2

## 2020-11-13 MED ORDER — BUPIVACAINE HCL (PF) 0.25 % IJ SOLN
INTRAMUSCULAR | Status: AC
  Filled 2020-11-13: qty 30

## 2020-11-13 MED ORDER — ONDANSETRON HCL 4 MG/2ML IJ SOLN: 4.0000 mg | Freq: Once | INTRAMUSCULAR | Status: DC | PRN

## 2020-11-13 MED ORDER — MIDAZOLAM HCL 5 MG/5ML IJ SOLN
INTRAMUSCULAR | Status: DC | PRN
  Administered 2020-11-13: 2 mg via INTRAVENOUS

## 2020-11-13 MED ORDER — LACTATED RINGERS IV SOLN: INTRAVENOUS | Status: DC

## 2020-11-13 MED ORDER — CHLORHEXIDINE GLUCONATE 0.12 % MT SOLN
OROMUCOSAL | Status: AC
  Administered 2020-11-13: 15 mL
  Filled 2020-11-13: qty 15

## 2020-11-13 MED ORDER — ORAL CARE MOUTH RINSE: 15.0000 mL | Freq: Once | OROMUCOSAL | Status: DC

## 2020-11-13 MED ORDER — 0.9 % SODIUM CHLORIDE (POUR BTL) OPTIME
TOPICAL | Status: DC | PRN
  Administered 2020-11-13: 1000 mL

## 2020-11-13 MED ORDER — KETOROLAC TROMETHAMINE 30 MG/ML IJ SOLN
INTRAMUSCULAR | Status: AC
  Filled 2020-11-13: qty 1

## 2020-11-13 MED ORDER — BUPIVACAINE HCL 0.25 % IJ SOLN
INTRAMUSCULAR | Status: DC | PRN
  Administered 2020-11-13: 10 mL

## 2020-11-13 MED ORDER — SUCCINYLCHOLINE CHLORIDE 200 MG/10ML IV SOSY
PREFILLED_SYRINGE | INTRAVENOUS | Status: AC
  Filled 2020-11-13: qty 10

## 2020-11-13 MED ORDER — GLYCOPYRROLATE PF 0.2 MG/ML IJ SOSY
PREFILLED_SYRINGE | INTRAMUSCULAR | Status: AC
  Filled 2020-11-13: qty 1

## 2020-11-13 MED ORDER — LIDOCAINE 2% (20 MG/ML) 5 ML SYRINGE
INTRAMUSCULAR | Status: DC | PRN
  Administered 2020-11-13: 100 mg via INTRAVENOUS

## 2020-11-13 MED ORDER — FENTANYL CITRATE (PF) 100 MCG/2ML IJ SOLN
INTRAMUSCULAR | Status: AC
  Filled 2020-11-13: qty 2

## 2020-11-13 MED ORDER — LIDOCAINE HCL (PF) 2 % IJ SOLN
INTRAMUSCULAR | Status: AC
  Filled 2020-11-13: qty 5

## 2020-11-13 SURGICAL SUPPLY — 23 items
BNDG GAUZE ELAST 4 BULKY (GAUZE/BANDAGES/DRESSINGS) ×1 IMPLANT
COVER SURGICAL LIGHT HANDLE (MISCELLANEOUS) ×4 IMPLANT
COVER WAND RF STERILE (DRAPES) ×2 IMPLANT
ELECT NDL TIP 2.8 STRL (NEEDLE) IMPLANT
ELECT NEEDLE TIP 2.8 STRL (NEEDLE) ×2 IMPLANT
ELECT REM PT RETURN 9FT ADLT (ELECTROSURGICAL) ×2
ELECTRODE REM PT RTRN 9FT ADLT (ELECTROSURGICAL) ×1 IMPLANT
GLOVE BIO SURGEON STRL SZ8 (GLOVE) ×2 IMPLANT
GLOVE SRG 8 PF TXTR STRL LF DI (GLOVE) ×1 IMPLANT
GLOVE SURG UNDER POLY LF SZ7 (GLOVE) ×4 IMPLANT
GLOVE SURG UNDER POLY LF SZ8 (GLOVE) ×2
GOWN STRL REUS W/TWL LRG LVL3 (GOWN DISPOSABLE) ×2 IMPLANT
GOWN STRL REUS W/TWL XL LVL3 (GOWN DISPOSABLE) ×2 IMPLANT
KIT TURNOVER KIT A (KITS) ×2 IMPLANT
MANIFOLD NEPTUNE II (INSTRUMENTS) ×2 IMPLANT
PACK MINOR (CUSTOM PROCEDURE TRAY) ×2 IMPLANT
PAD ARMBOARD 7.5X6 YLW CONV (MISCELLANEOUS) ×2 IMPLANT
SHEET LAVH (DRAPES) ×1 IMPLANT
SOL PREP PROV IODINE SCRUB 4OZ (MISCELLANEOUS) ×2 IMPLANT
SUPPORT SCROTAL LG STRP (MISCELLANEOUS) ×1 IMPLANT
SUT VIC AB 4-0 SH 27 (SUTURE) ×2
SUT VIC AB 4-0 SH 27XBRD (SUTURE) IMPLANT
SYR CONTROL 10ML LL (SYRINGE) ×2 IMPLANT

## 2020-11-13 NOTE — Op Note (Signed)
Preoperative diagnosis: scrotal sebaceous cysts  Postoperative diagnosis: Same  Procedure: 1. Excision of scrotal sebaceous cysts  Attending: Wilkie Aye, MD  Anesthesia: General  History of blood loss: Minimal  Antibiotics: ancef  Drains: none  Specimens: 1. 2 scortal sebaceous cysts   Findings: 2cm posterior scrotal wall sebaceous cyst and 1cm left scrotal wall sebaceous cyst  Indications: Patient is a 50 year old male with a history of multiple scrotal sebaceous cysts that were growing in size and causing abscesses  We discussed the treatment options including observation versus excision after discussing treatment options he proceed with excision.   Procedure in detail: Prior to procedure consent was obtained.  Patient was brought to the operating room and a brief timeout was done to ensure correct patient, correct procedure, correct site.  General anesthesia was administered and patient was placed in dorsal lithotomy position.  His genitalia was then prepped and draped in usual sterile fashion.  An elliptical incision was made around the 2 sebaceous cysts then using sharp dissection the cysts were excised. Hemostasis was obtained with electrocautery. We then used 4-0 vircyl to close the 2 incisions in the scrotum. A dressing was then applied to the incision.  We then placed a scrotal fluff and this then concluded the procedure which was well tolerated by the patient.  Complications: None  Condition: Stable, extubated, transferred to PACU.  Plan: Patient is to be discharged home.  He is to follow up in 2 weeks for wound check.

## 2020-11-13 NOTE — H&P (Signed)
Urology Admission H&P  Chief Complaint: scrotal swelling  History of Present Illness:  [name] [name] is a 50yo with recurrent scrotal abscesses who was found to have a posterior wall scrotal sebaceous cyst. No recent scrotal abscess. No drainage from cyst  Past Medical History:  Diagnosis Date   Anxiety    Arthritis    CAD (coronary artery disease)    Moderate LAD disease 2016 - Dr. 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   Type 2 diabetes mellitus (HCC)    Past Surgical History:  Procedure Laterality Date   ANKLE SURGERY Right 1982   "had extra bones in there; took them out"   APPENDECTOMY  1975   BIOPSY  12/26/2018   Procedure: BIOPSY;  Surgeon: Malissa Hippo, MD;  [facility];  Service: Endoscopy;;  duodenal biopsies   BIOPSY  03/23/2019   Procedure: BIOPSY;  Surgeon: Malissa Hippo, MD;  [facility];  Service: Endoscopy;;  esophagus   CARDIAC CATHETERIZATION N/A 03/28/2015   Procedure: Left Heart Cath and Coronary Angiography;  Surgeon: Yates Decamp, MD;  Location: [facility] INVASIVE CV LAB;  Service: Cardiovascular;  Laterality: N/A;   CARDIAC CATHETERIZATION N/A 03/28/2015   Procedure: Intravascular Pressure Wire/FFR Study;  Surgeon: Yates Decamp, MD;  Location: [facility] INVASIVE CV LAB;  Service: Cardiovascular;  Laterality: N/A;   CARPAL TUNNEL RELEASE Left ~ 2008   COLONOSCOPY WITH ESOPHAGOGASTRODUODENOSCOPY (EGD)     COLONOSCOPY WITH PROPOFOL N/A 12/24/2019   Procedure: COLONOSCOPY WITH PROPOFOL;  Surgeon: Malissa Hippo, MD;  [facility];  Service: Endoscopy;  Laterality: N/A;  955   ELBOW FRACTURE SURGERY Left ~ 2008    ESOPHAGOGASTRODUODENOSCOPY N/A 12/26/2018   Procedure: ESOPHAGOGASTRODUODENOSCOPY (EGD);  Surgeon: Malissa Hippo, MD;  [facility];  Service: Endoscopy;  Laterality: N/A;   ESOPHAGOGASTRODUODENOSCOPY (EGD) WITH PROPOFOL N/A 03/23/2019   Procedure: ESOPHAGOGASTRODUODENOSCOPY (EGD) WITH PROPOFOL;  Surgeon: Malissa Hippo, MD;  [facility];  Service: Endoscopy;  Laterality: N/A;  7:30   FRACTURE SURGERY     ankle and elbow   PILONIDAL CYST EXCISION N/A 03/24/2017   Procedure: EXCISION CHRONIC  PILONIDAL ABSCESS;  Surgeon: [name] Miyamoto, MD;  [facility];  Service: General;  Laterality: N/A;   TENDON REPAIR Left ~ 2004   "main tendon in my ankle"    Home Medications:  Current Facility-Administered Medications  Medication Dose Route Frequency Provider Last Rate Last Admin   ceFAZolin (ANCEF) 2-4 GM/100ML-% IVPB            ceFAZolin (ANCEF) IVPB 2g/100 mL premix  2 g Intravenous 30 min Pre-Op Javarie Crisp, Mardene Celeste, MD       chlorhexidine (PERIDEX) 0.12 % solution            Allergies:  Allergies  Allergen Reactions   Omeprazole Magnesium Swelling    Face swells, no breathing impairment   Bee Venom    Esomeprazole Swelling    Face swells, no breathing impairment   Morphine And Related     Itching  at IV site, dry heaving, burning sensation in throat.    Family History  Problem Relation Age of Onset   Congestive Heart Failure Sister    Diabetes Mother    Hypertension Mother    Cancer Mother    Diabetes Father    Hypertension Father    Hyperlipidemia Father    Social History:  reports that he has been smoking cigarettes. He has a 17.00 pack-year smoking history. He has never used smokeless tobacco. He reports that he does not drink alcohol and does not use drugs.  Review of Systems  Genitourinary:  Positive for scrotal swelling.  All other systems reviewed and are negative.  Physical Exam:  Vital signs in last 24 hours: Temp:  [97.6 F  (36.4 C)] 97.6 F (36.4 C) (06/16 0809) Resp:  [20] 20 (06/16 0809) BP: (125)/(86) 125/86 (06/16 0809) SpO2:  [96 %] 96 % (06/16 0809) Weight:  [76.2 kg] 76.2 kg (06/16 0809) Physical Exam Constitutional:      Appearance: Normal appearance.  HENT:     Head: Normocephalic and atraumatic.     Nose: Nose normal.     Mouth/Throat:     Mouth: Mucous membranes are dry.  Eyes:     Extraocular Movements: Extraocular movements intact.     Pupils: Pupils are equal, round, and reactive to light.  Cardiovascular:     Rate and Rhythm: Normal rate and regular rhythm.  Pulmonary:     Effort: Pulmonary effort is normal. No respiratory distress.  Abdominal:     General: Abdomen is flat. There is no distension.  Musculoskeletal:        General: No swelling. Normal range of motion.     Cervical back: Normal range of motion and neck supple.  Skin:    General: Skin is warm and dry.  Neurological:     General: No focal deficit present.     Mental Status: He is alert and oriented to person, place, and time.  Psychiatric:        Mood and Affect: Mood normal.        Behavior: Behavior normal.        Thought Content: Thought content normal.        Judgment: Judgment normal.    Laboratory Data:  Results for orders placed or performed during the hospital encounter of 11/13/20 (from the past 24 hour(s))  Glucose, capillary     Status: Abnormal   Collection Time: 11/13/20  7:55 AM  Result Value Ref Range   Glucose-Capillary 122 (H) 70 - 99 mg/dL   Recent Results (from the past 240 hour(s))  Urine Culture     Status: None   Collection Time: 11/11/20  2:45 AM   Specimen: Urine, Clean Catch  Result Value Ref Range Status   Specimen Description   Final    URINE, CLEAN CATCH Performed at [facility], [address], [place], [place] [address]    Special Requests   Final    NONE Performed at [facility], [address], [place], [place] [address]    Culture   Final    NO GROWTH Performed at  [facility], [address], [place], [place] [address]    Report Status 11/12/2020 FINAL  Final   Creatinine: Recent Labs    11/11/20 0245 11/11/20 1424  CREATININE 1.10 1.09   Baseline Creatinine: 1  Impression/Assessment:  50yo with a scrotal sebaceous cyst  Plan:  The risks/benefits/alternatives to excision of a scrotal sebaceous  cyst was explained to the patient and he understands and wishes to proceed with surgery  Wilkie Aye 11/13/2020, 9:35 AM

## 2020-11-13 NOTE — Anesthesia Procedure Notes (Addendum)
Procedure Name: Intubation Date/Time: 11/13/2020 10:19 AM Performed by: Julian Reil, CRNA Pre-anesthesia Checklist: Patient identified, Emergency Drugs available, Suction available and Patient being monitored Patient Re-evaluated:Patient Re-evaluated prior to induction Oxygen Delivery Method: Circle system utilized Preoxygenation: Pre-oxygenation with 100% oxygen Induction Type: IV induction, Rapid sequence and Cricoid Pressure applied Laryngoscope Size: Miller and 3 Grade View: Grade I Tube type: Oral Tube size: 7.5 mm Number of attempts: 1 Airway Equipment and Method: Stylet Placement Confirmation: ETT inserted through vocal cords under direct vision, positive ETCO2 and breath sounds checked- equal and bilateral Secured at: 23 cm Tube secured with: Tape Dental Injury: Teeth and Oropharynx as per pre-operative assessment

## 2020-11-13 NOTE — Anesthesia Preprocedure Evaluation (Signed)
Anesthesia Evaluation  Patient identified by MRN, date of birth, ID band Patient awake    Reviewed: Allergy & Precautions, H&P , NPO status , Patient's Chart, lab work & pertinent test results, reviewed documented beta blocker date and time   Airway Mallampati: II  TM Distance: >3 FB Neck ROM: full    Dental no notable dental hx.    Pulmonary sleep apnea , COPD, Current Smoker and Patient abstained from smoking.,    Pulmonary exam normal breath sounds clear to auscultation       Cardiovascular Exercise Tolerance: Good hypertension, + angina + CAD   Rhythm:regular Rate:Normal     Neuro/Psych  Headaches, PSYCHIATRIC DISORDERS Anxiety Depression  Neuromuscular disease    GI/Hepatic Neg liver ROS, GERD  Medicated,  Endo/Other  negative endocrine ROSdiabetes  Renal/GU negative Renal ROS  negative genitourinary   Musculoskeletal   Abdominal   Peds  Hematology negative hematology ROS (+)   Anesthesia Other Findings   Reproductive/Obstetrics negative OB ROS                             Anesthesia Physical Anesthesia Plan  ASA: 3  Anesthesia Plan: General LMA   Post-op Pain Management:    Induction:   PONV Risk Score and Plan: Ondansetron  Airway Management Planned:   Additional Equipment:   Intra-op Plan:   Post-operative Plan:   Informed Consent: I have reviewed the patients History and Physical, chart, labs and discussed the procedure including the risks, benefits and alternatives for the proposed anesthesia with the patient or authorized representative who has indicated his/her understanding and acceptance.     Dental Advisory Given  Plan Discussed with: CRNA  Anesthesia Plan Comments:         Anesthesia Quick Evaluation

## 2020-11-13 NOTE — Transfer of Care (Signed)
Immediate Anesthesia Transfer of Care Note  Patient: [name] [name]  Procedure(s) Performed: EXCISION OF SEBACEOUS CYSTS, SCROTUM (Scrotum)  Patient Location: PACU  Anesthesia Type:General  Level of Consciousness: awake, alert  and oriented  Airway & Oxygen Therapy: Patient Spontanous Breathing and Patient connected to face mask oxygen  Post-op Assessment: Report given to RN, Post -op Vital signs reviewed and stable and Patient moving all extremities X 4  Post vital signs: Reviewed and stable  Last Vitals:  Vitals Value Taken Time  BP 120/75 11/13/20 1107  Temp    Pulse 82 11/13/20 1107  Resp 20 11/13/20 1107  SpO2 100 % 11/13/20 1107  Vitals shown include unvalidated device data.  Last Pain:  Vitals:   11/13/20 0809  TempSrc: Oral  PainSc: 0-No pain      Patients Stated Pain Goal: 7 (11/13/20 0809)  Complications: No notable events documented.

## 2020-11-14 NOTE — Anesthesia Postprocedure Evaluation (Signed)
Anesthesia Post Note  Patient: [name] [name]  Procedure(s) Performed: EXCISION OF SEBACEOUS CYSTS, SCROTUM (Scrotum)  Patient location during evaluation: Phase II Anesthesia Type: General Level of consciousness: awake Pain management: pain level controlled Vital Signs Assessment: post-procedure vital signs reviewed and stable Respiratory status: spontaneous breathing and respiratory function stable Cardiovascular status: blood pressure returned to baseline and stable Postop Assessment: no headache and no apparent nausea or vomiting Anesthetic complications: no Comments: Late entry   No notable events documented.   Last Vitals:  Vitals:   11/13/20 1138 11/13/20 1148  BP: (!) 130/93 (!) 133/91  Pulse: 82 83  Resp: 18 20  Temp:  36.6 C  SpO2: 99% 100%    Last Pain:  Vitals:   11/13/20 1148  TempSrc: Oral  PainSc: 0-No pain                 Windell Norfolk

## 2020-11-15 LAB — SURGICAL PATHOLOGY

## 2020-11-18 ENCOUNTER — Encounter (HOSPITAL_COMMUNITY): Payer: Self-pay | Admitting: Urology

## 2020-11-19 ENCOUNTER — Ambulatory Visit (INDEPENDENT_AMBULATORY_CARE_PROVIDER_SITE_OTHER): Payer: Self-pay | Admitting: Urology

## 2020-11-19 ENCOUNTER — Other Ambulatory Visit: Payer: Self-pay

## 2020-11-19 VITALS — BP 94/66 | HR 118 | Temp 99.5°F

## 2020-11-19 DIAGNOSIS — N492 Inflammatory disorders of scrotum: Secondary | ICD-10-CM | POA: Insufficient documentation

## 2020-11-19 MED ORDER — OXYCODONE-ACETAMINOPHEN 5-325 MG PO TABS: 1.0000 | ORAL_TABLET | ORAL | 0 refills | Status: DC | PRN

## 2020-11-19 NOTE — Progress Notes (Signed)
11/19/2020 11:56 AM   [name] [name] Oct 27, 1970 [id number]  Referring provider: Soyla Dryer, PA-C [place],  [place] [address]  [place] sebaceous cyst removal   HPI: [name] [name] is a 50yo here for followup after scrotal sebaceous cyst excision. He was doing well until yesterday when he developed sharp scrotal pain. No erythema, induration of drainage from his incisions   PMH: Past Medical History:  Diagnosis Date   Anxiety    Arthritis    CAD (coronary artery disease)    Moderate LAD disease 2016 - Dr. 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   Type 2 diabetes mellitus ([place])     Surgical History: Past Surgical History:  Procedure Laterality Date   ANKLE SURGERY Right 1982   "had extra bones in there; took them out"   [place]  12/26/2018   Procedure: BIOPSY;  Surgeon: Rogene Houston, MD;  [facility];  Service: Endoscopy;;  duodenal biopsies   BIOPSY  03/23/2019   Procedure: BIOPSY;  Surgeon: Rogene Houston, MD;  [facility];  Service: Endoscopy;;  esophagus   CARDIAC CATHETERIZATION N/A 03/28/2015   Procedure: Left Heart Cath and Coronary Angiography;  Surgeon: Adrian Prows, MD;  Location: [place] CV LAB;  Service: Cardiovascular;  Laterality: N/A;   CARDIAC CATHETERIZATION N/A 03/28/2015   Procedure: Intravascular Pressure Wire/FFR Study;  Surgeon: Adrian Prows, MD;  Location: [place] CV LAB;  Service: Cardiovascular;  Laterality: N/A;   CARPAL TUNNEL RELEASE Left ~ 2008   COLONOSCOPY WITH ESOPHAGOGASTRODUODENOSCOPY (EGD)     COLONOSCOPY WITH PROPOFOL N/A 12/24/2019    Procedure: COLONOSCOPY WITH PROPOFOL;  Surgeon: Rogene Houston, MD;  [facility];  Service: Endoscopy;  Laterality: N/A;  955   ELBOW FRACTURE SURGERY Left ~ 2008   ESOPHAGOGASTRODUODENOSCOPY N/A 12/26/2018   Procedure: ESOPHAGOGASTRODUODENOSCOPY (EGD);  Surgeon: Rogene Houston, MD;  [facility];  Service: Endoscopy;  Laterality: N/A;   ESOPHAGOGASTRODUODENOSCOPY (EGD) WITH PROPOFOL N/A 03/23/2019   Procedure: ESOPHAGOGASTRODUODENOSCOPY (EGD) WITH PROPOFOL;  Surgeon: Rogene Houston, MD;  [facility];  Service: Endoscopy;  Laterality: N/A;  7:30   FRACTURE SURGERY     ankle and elbow   PILONIDAL CYST EXCISION N/A 03/24/2017   Procedure: EXCISION CHRONIC  PILONIDAL ABSCESS;  Surgeon: Coralie Keens, MD;  [facility];  Service: General;  Laterality: N/A;   SCROTAL EXPLORATION N/A 11/13/2020   Procedure: EXCISION OF SEBACEOUS CYSTS, SCROTUM;  Surgeon: Cleon Gustin, MD;  Location[facility];  Service: Urology;  Laterality: N/A;   TENDON REPAIR Left ~ 2004   "main tendon in my ankle"    Home Medications:  Allergies as of 11/19/2020       Reactions   Omeprazole Magnesium Swelling   Face swells, no breathing impairment   Bee Venom    Esomeprazole Swelling   Face swells, no breathing impairment   Morphine And Related    Itching at IV site, dry heaving, burning sensation in throat.  Medication List        Accurate as of November 19, 2020 11:56 AM. If you have any questions, ask your nurse or doctor.          Accu-Chek Guide Me w/Device Kit 1 Piece by Does not apply route as directed.   Accu-Chek Guide test strip Generic drug: glucose blood Use to test glucose 4 times a day   ADVAIR DISKUS IN Inhale 1 puff into the lungs daily. Rinse mouth after use   albuterol 108 (90 Base) MCG/ACT inhaler Commonly known as: Proventil HFA INHALE 2 PUFFS BY MOUTH EVERY 6 HOURS AS NEEDED FOR COUGHING, WHEEZING, OR SHORTNESS OF BREATH    ARIPiprazole 5 MG tablet Commonly known as: ABILIFY Take 5 mg by mouth daily.   Aspirin Low Dose 81 MG EC tablet Generic drug: aspirin Take 81 mg by mouth every morning.   atorvastatin 80 MG tablet Commonly known as: LIPITOR TAKE 1 Tablet BY MOUTH ONCE EVERY DAY What changed: See the new instructions.   cefdinir 300 MG capsule Commonly known as: OMNICEF Take 1 capsule (300 mg total) by mouth 2 (two) times daily.   doxycycline 100 MG capsule Commonly known as: VIBRAMYCIN Take 1 capsule (100 mg total) by mouth 2 (two) times daily.   famotidine 40 MG tablet Commonly known as: PEPCID Take 1 tablet (40 mg total) by mouth 2 (two) times daily.   glipiZIDE 5 MG 24 hr tablet Commonly known as: GLUCOTROL XL Take 1 tablet (5 mg total) by mouth daily with breakfast.   insulin aspart 100 UNIT/ML injection Commonly known as: NovoLOG Inject 14-20 Units into the skin 3 (three) times daily before meals.   Lantus SoloStar 100 UNIT/ML Solostar Pen Generic drug: insulin glargine Inject 60 Units into the skin at bedtime.   metFORMIN 500 MG tablet Commonly known as: GLUCOPHAGE Take 1 tablet (500 mg total) by mouth 2 (two) times daily with a meal.   metoCLOPramide 5 MG tablet Commonly known as: Reglan Take 1 tablet (5 mg total) by mouth 4 (four) times daily -  before meals and at bedtime.   nitroGLYCERIN 0.4 MG SL tablet Commonly known as: NITROSTAT Place 1 tablet (0.4 mg total) under the tongue every 5 (five) minutes as needed for chest pain.   oxyCODONE-acetaminophen 5-325 MG tablet Commonly known as: Percocet Take 1 tablet by mouth every 4 (four) hours as needed for severe pain.   polyethylene glycol 17 g packet Commonly known as: MIRALAX / GLYCOLAX Take 17 g by mouth daily.   promethazine 25 MG suppository Commonly known as: PHENERGAN Place 1 suppository (25 mg total) rectally every 8 (eight) hours as needed for refractory nausea / vomiting.   sertraline 100 MG  tablet Commonly known as: ZOLOFT Take 1 tablet by mouth daily.   traZODone 50 MG tablet Commonly known as: DESYREL Take 50 mg by mouth at bedtime.        Allergies:  Allergies  Allergen Reactions   Omeprazole Magnesium Swelling    Face swells, no breathing impairment   Bee Venom    Esomeprazole Swelling    Face swells, no breathing impairment   Morphine And Related     Itching at IV site, dry heaving, burning sensation in throat.    Family History: Family History  Problem Relation Age of Onset   Congestive Heart Failure Sister    Diabetes Mother    Hypertension Mother    Cancer Mother    Diabetes Father    Hypertension  Father    Hyperlipidemia Father     Social History:  reports that he has been smoking cigarettes. He has a 17.00 pack-year smoking history. He has never used smokeless tobacco. He reports that he does not drink alcohol and does not use drugs.  ROS: All other review of systems were reviewed and are negative except what is noted above in HPI  Physical Exam: BP 94/66   Pulse (!) 118   Temp 99.5 F (37.5 C)   Constitutional:  Alert and oriented, No acute distress. HEENT: [place] AT, moist mucus membranes.  Trachea midline, no masses. Cardiovascular: No clubbing, cyanosis, or edema. Respiratory: Normal respiratory effort, no increased work of breathing. GI: Abdomen is soft, nontender, nondistended, no abdominal masses GU: No CVA tenderness. Healing scrotal incisions. No induration, erythema or purulence. Lymph: No cervical or inguinal lymphadenopathy. Skin: No rashes, bruises or suspicious lesions. Neurologic: Grossly intact, no focal deficits, moving all 4 extremities. Psychiatric: Normal mood and affect.  Laboratory Data: Lab Results  Component Value Date   WBC 7.1 11/11/2020   HGB 15.2 11/11/2020   HCT 45.3 11/11/2020   MCV 94.4 11/11/2020   PLT 200 11/11/2020    Lab Results  Component Value Date   CREATININE 1.09 11/11/2020    No  results found for: PSA  No results found for: TESTOSTERONE  Lab Results  Component Value Date   HGBA1C 13.6 (H) 11/11/2020    Urinalysis    Component Value Date/Time   COLORURINE STRAW (A) 11/11/2020 0245   APPEARANCEUR CLEAR 11/11/2020 0245   APPEARANCEUR Clear 10/03/2020 1516   LABSPEC 1.015 11/11/2020 0245   PHURINE 6.0 11/11/2020 0245   GLUCOSEU >=500 (A) 11/11/2020 0245   HGBUR MODERATE (A) 11/11/2020 0245   BILIRUBINUR NEGATIVE 11/11/2020 0245   BILIRUBINUR Negative 10/03/2020 1516   KETONESUR NEGATIVE 11/11/2020 0245   PROTEINUR 100 (A) 11/11/2020 0245   UROBILINOGEN negative (A) 09/03/2020 1148   NITRITE NEGATIVE 11/11/2020 0245   LEUKOCYTESUR NEGATIVE 11/11/2020 0245    Lab Results  Component Value Date   LABMICR See below: 10/03/2020   WBCUA None seen 10/03/2020   LABEPIT 0-10 10/03/2020   BACTERIA RARE (A) 11/11/2020    Pertinent Imaging:  No results found for this or any previous visit.  No results found for this or any previous visit.  No results found for this or any previous visit.  No results found for this or any previous visit.  No results found for this or any previous visit.  No results found for this or any previous visit.  No results found for this or any previous visit.  No results found for this or any previous visit.   Assessment & Plan:    1. Scrotal abscess Well healing scrotal incisions Oxycodone rx refilled    No follow-ups on file.  Nicolette Bang, MD  [facility] [place]

## 2020-11-19 NOTE — Progress Notes (Signed)
Patient here today c/o burning sensation to wound area from post op. Reports bruising to area as well.

## 2020-11-19 NOTE — Telephone Encounter (Signed)
LVM for patient to contact the office to schedule an appointment to discuss a tif with Dr Barron Alvine.

## 2020-11-25 ENCOUNTER — Encounter: Payer: Self-pay | Admitting: Urology

## 2020-11-25 NOTE — Patient Instructions (Signed)
Skin Abscess  A skin abscess is an infected area on or under your skin that contains a collection of pus and other material. An abscess may also be called a furuncle,carbuncle, or boil. An abscess can occur in or on almost any part of your body. Some abscesses break open (rupture) on their own. Most continue to get worse unless they are treated. The infection can spread deeper into the body and eventually into your blood, whichcan make you feel ill. Treatment usually involves draining the abscess. What are the causes? An abscess occurs when germs, like bacteria, pass through your skin and cause an infection. This may be caused by: A scrape or cut on your skin. A puncture wound through your skin, including a needle injection or insect bite. Blocked oil or sweat glands. Blocked and infected hair follicles. A cyst that forms beneath your skin (sebaceous cyst) and becomes infected. What increases the risk? This condition is more likely to develop in people who: Have a weak body defense system (immune system). Have diabetes. Have dry and irritated skin. Get frequent injections or use illegal IV drugs. Have a foreign body in a wound, such as a splinter. Have problems with their lymph system or veins. What are the signs or symptoms? Symptoms of this condition include: A painful, firm bump under the skin. A bump with pus at the top. This may break through the skin and drain. Other symptoms include: Redness surrounding the abscess site. Warmth. Swelling of the lymph nodes (glands) near the abscess. Tenderness. A sore on the skin. How is this diagnosed? This condition may be diagnosed based on: A physical exam. Your medical history. A sample of pus. This may be used to find out what is causing the infection. Blood tests. Imaging tests, such as an ultrasound, CT scan, or MRI. How is this treated? A small abscess that drains on its own may not need treatment. Treatment for larger abscesses  may include: Moist heat or heat pack applied to the area several times a day. A procedure to drain the abscess (incision and drainage). Antibiotic medicines. For a severe abscess, you may first get antibiotics through an IV and then change to antibiotics by mouth. Follow these instructions at home: Medicines  Take over-the-counter and prescription medicines only as told by your health care provider. If you were prescribed an antibiotic medicine, take it as told by your health care provider. Do not stop taking the antibiotic even if you start to feel better.  Abscess care  If you have an abscess that has not drained, apply heat to the affected area. Use the heat source that your health care provider recommends, such as a moist heat pack or a heating pad. Place a towel between your skin and the heat source. Leave the heat on for 20-30 minutes. Remove the heat if your skin turns bright red. This is especially important if you are unable to feel pain, heat, or cold. You may have a greater risk of getting burned. Follow instructions from your health care provider about how to take care of your abscess. Make sure you: Cover the abscess with a bandage (dressing). Change your dressing or gauze as told by your health care provider. Wash your hands with soap and water before you change the dressing or gauze. If soap and water are not available, use hand sanitizer. Check your abscess every day for signs of a worsening infection. Check for: More redness, swelling, or pain. More fluid or blood. Warmth. More   pus or a bad smell.  General instructions To avoid spreading the infection: Do not share personal care items, towels, or hot tubs with others. Avoid making skin contact with other people. Keep all follow-up visits as told by your health care provider. This is important. Contact a health care provider if you have: More redness, swelling, or pain around your abscess. More fluid or blood coming  from your abscess. Warm skin around your abscess. More pus or a bad smell coming from your abscess. A fever. Muscle aches. Chills or a general ill feeling. Get help right away if you: Have severe pain. See red streaks on your skin spreading away from the abscess. Summary A skin abscess is an infected area on or under your skin that contains a collection of pus and other material. A small abscess that drains on its own may not need treatment. Treatment for larger abscesses may include having a procedure to drain the abscess and taking an antibiotic. This information is not intended to replace advice given to you by your health care provider. Make sure you discuss any questions you have with your healthcare provider. Document Revised: 09/07/2018 Document Reviewed: 06/30/2017 Elsevier Patient Education  2022 [employer]  

## 2020-11-28 ENCOUNTER — Ambulatory Visit: Payer: Self-pay | Admitting: Urology

## 2020-12-04 LAB — HM DIABETES EYE EXAM

## 2020-12-10 ENCOUNTER — Other Ambulatory Visit: Payer: Self-pay

## 2020-12-10 ENCOUNTER — Ambulatory Visit (INDEPENDENT_AMBULATORY_CARE_PROVIDER_SITE_OTHER): Payer: Self-pay | Admitting: Urology

## 2020-12-10 VITALS — BP 109/68 | HR 111

## 2020-12-10 DIAGNOSIS — N492 Inflammatory disorders of scrotum: Secondary | ICD-10-CM

## 2020-12-10 LAB — URINALYSIS, ROUTINE W REFLEX MICROSCOPIC
Bilirubin, UA: NEGATIVE
Ketones, UA: NEGATIVE
Leukocytes,UA: NEGATIVE
Nitrite, UA: NEGATIVE
Specific Gravity, UA: >1.03 — ABNORMAL HIGH (ref 1.005–1.030)
Urobilinogen, Ur: 0.2 mg/dL (ref 0.2–1.0)
pH, UA: 5.5 (ref 5.0–7.5)

## 2020-12-10 LAB — MICROSCOPIC EXAMINATION
Bacteria, UA: NONE SEEN
Epithelial Cells (non renal): NONE SEEN /hpf (ref 0–10)
Renal Epithel, UA: NONE SEEN /hpf
WBC, UA: NONE SEEN /hpf (ref 0–5)

## 2020-12-10 MED ORDER — BACITRACIN 500 UNIT/GM EX OINT: 1.0000 "application " | TOPICAL_OINTMENT | Freq: Two times a day (BID) | CUTANEOUS | 3 refills | Status: DC

## 2020-12-10 NOTE — Patient Instructions (Signed)
Skin Abscess  A skin abscess is an infected area on or under your skin that contains a collection of pus and other material. An abscess may also be called a furuncle,carbuncle, or boil. An abscess can occur in or on almost any part of your body. Some abscesses break open (rupture) on their own. Most continue to get worse unless they are treated. The infection can spread deeper into the body and eventually into your blood, whichcan make you feel ill. Treatment usually involves draining the abscess. What are the causes? An abscess occurs when germs, like bacteria, pass through your skin and cause an infection. This may be caused by: A scrape or cut on your skin. A puncture wound through your skin, including a needle injection or insect bite. Blocked oil or sweat glands. Blocked and infected hair follicles. A cyst that forms beneath your skin (sebaceous cyst) and becomes infected. What increases the risk? This condition is more likely to develop in people who: Have a weak body defense system (immune system). Have diabetes. Have dry and irritated skin. Get frequent injections or use illegal IV drugs. Have a foreign body in a wound, such as a splinter. Have problems with their lymph system or veins. What are the signs or symptoms? Symptoms of this condition include: A painful, firm bump under the skin. A bump with pus at the top. This may break through the skin and drain. Other symptoms include: Redness surrounding the abscess site. Warmth. Swelling of the lymph nodes (glands) near the abscess. Tenderness. A sore on the skin. How is this diagnosed? This condition may be diagnosed based on: A physical exam. Your medical history. A sample of pus. This may be used to find out what is causing the infection. Blood tests. Imaging tests, such as an ultrasound, CT scan, or MRI. How is this treated? A small abscess that drains on its own may not need treatment. Treatment for larger abscesses  may include: Moist heat or heat pack applied to the area several times a day. A procedure to drain the abscess (incision and drainage). Antibiotic medicines. For a severe abscess, you may first get antibiotics through an IV and then change to antibiotics by mouth. Follow these instructions at home: Medicines  Take over-the-counter and prescription medicines only as told by your health care provider. If you were prescribed an antibiotic medicine, take it as told by your health care provider. Do not stop taking the antibiotic even if you start to feel better.  Abscess care  If you have an abscess that has not drained, apply heat to the affected area. Use the heat source that your health care provider recommends, such as a moist heat pack or a heating pad. Place a towel between your skin and the heat source. Leave the heat on for 20-30 minutes. Remove the heat if your skin turns bright red. This is especially important if you are unable to feel pain, heat, or cold. You may have a greater risk of getting burned. Follow instructions from your health care provider about how to take care of your abscess. Make sure you: Cover the abscess with a bandage (dressing). Change your dressing or gauze as told by your health care provider. Wash your hands with soap and water before you change the dressing or gauze. If soap and water are not available, use hand sanitizer. Check your abscess every day for signs of a worsening infection. Check for: More redness, swelling, or pain. More fluid or blood. Warmth. More   pus or a bad smell.  General instructions To avoid spreading the infection: Do not share personal care items, towels, or hot tubs with others. Avoid making skin contact with other people. Keep all follow-up visits as told by your health care provider. This is important. Contact a health care provider if you have: More redness, swelling, or pain around your abscess. More fluid or blood coming  from your abscess. Warm skin around your abscess. More pus or a bad smell coming from your abscess. A fever. Muscle aches. Chills or a general ill feeling. Get help right away if you: Have severe pain. See red streaks on your skin spreading away from the abscess. Summary A skin abscess is an infected area on or under your skin that contains a collection of pus and other material. A small abscess that drains on its own may not need treatment. Treatment for larger abscesses may include having a procedure to drain the abscess and taking an antibiotic. This information is not intended to replace advice given to you by your health care provider. Make sure you discuss any questions you have with your healthcare provider. Document Revised: 09/07/2018 Document Reviewed: 06/30/2017 Elsevier Patient Education  2022 [employer]  

## 2020-12-10 NOTE — Progress Notes (Signed)
Urological Symptom Review  Patient is experiencing the following symptoms: Frequent urination Get up at night to urinate Stream starts and stops Weak stream   Review of Systems  Gastrointestinal (upper)  : Nausea Vomiting Indigestion/heartburn  Gastrointestinal (lower) : Constipation  Constitutional : Negative for symptoms  Skin: Itching  Eyes: Blurred vision  Ear/Nose/Throat : Sinus problems  Hematologic/Lymphatic: Negative for Hematologic/Lymphatic symptoms  Cardiovascular : Leg swelling Chest pain  Respiratory : Cough Shortness of breath  Endocrine: Excessive thirst  Musculoskeletal: Back pain Joint pain  Neurological: Headaches Dizziness  Psychologic: Depression Anxiety

## 2020-12-10 NOTE — Progress Notes (Signed)
12/10/2020 9:15 AM   [name] [name] [birth year] [id number]  Referring provider: Soyla Dryer, PA-C [place],  [place] [address]  Followup sebaceous cyst excision  HPI: [name] is a 50yo here for followup after scrotal sebaceous cyst excision. No drainage or swelling from incisions. No pain. No fevers. No other complaints today   PMH: Past Medical History:  Diagnosis Date   Anxiety    Arthritis    CAD (coronary artery disease)    Moderate LAD disease 2016 - Dr. 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   Type 2 diabetes mellitus ([place])     Surgical History: Past Surgical History:  Procedure Laterality Date   ANKLE SURGERY Right 1982   "had extra bones in there; took them out"   [place]  12/26/2018   Procedure: BIOPSY;  Surgeon: Rogene Houston, MD;  [facility];  Service: Endoscopy;;  duodenal biopsies   BIOPSY  03/23/2019   Procedure: BIOPSY;  Surgeon: Rogene Houston, MD;  [facility];  Service: Endoscopy;;  esophagus   CARDIAC CATHETERIZATION N/A 03/28/2015   Procedure: Left Heart Cath and Coronary Angiography;  Surgeon: Adrian Prows, MD;  Location: [facility] [facility];  Service: Cardiovascular;  Laterality: N/A;   CARDIAC CATHETERIZATION N/A 03/28/2015   Procedure: Intravascular Pressure Wire/FFR Study;  Surgeon: Adrian Prows, MD;  Location: [place] [facility];  Service: Cardiovascular;  Laterality: N/A;   CARPAL TUNNEL RELEASE Left ~ 2008   COLONOSCOPY WITH ESOPHAGOGASTRODUODENOSCOPY (EGD)     COLONOSCOPY WITH PROPOFOL N/A 12/24/2019   Procedure: COLONOSCOPY WITH PROPOFOL;   Surgeon: Rogene Houston, MD;  [facility];  Service: Endoscopy;  Laterality: N/A;  955   ELBOW FRACTURE SURGERY Left ~ 2008   ESOPHAGOGASTRODUODENOSCOPY N/A 12/26/2018   Procedure: ESOPHAGOGASTRODUODENOSCOPY (EGD);  Surgeon: Rogene Houston, MD;  [facility];  Service: Endoscopy;  Laterality: N/A;   ESOPHAGOGASTRODUODENOSCOPY (EGD) WITH PROPOFOL N/A 03/23/2019   Procedure: ESOPHAGOGASTRODUODENOSCOPY (EGD) WITH PROPOFOL;  Surgeon: Rogene Houston, MD;  [facility];  Service: Endoscopy;  Laterality: N/A;  7:30   FRACTURE SURGERY     ankle and elbow   PILONIDAL CYST EXCISION N/A 03/24/2017   Procedure: EXCISION CHRONIC  PILONIDAL ABSCESS;  Surgeon: Coralie Keens, MD;  Location: [facility];  Service: General;  Laterality: N/A;   SCROTAL EXPLORATION N/A 11/13/2020   Procedure: EXCISION OF SEBACEOUS CYSTS, SCROTUM;  Surgeon: Cleon Gustin, MD;  Location: [facility];  Service: Urology;  Laterality: N/A;   TENDON REPAIR Left ~ 2004   "main tendon in my ankle"    Home Medications:  Allergies as of 12/10/2020       Reactions   Omeprazole Magnesium Swelling   Face swells, no breathing impairment   Bee Venom    Esomeprazole Swelling   Face swells, no breathing impairment   Morphine And Related    Itching at IV site, dry heaving, burning sensation in throat.  Medication List        Accurate as of December 10, 2020  9:15 AM. If you have any questions, ask your nurse or doctor.          Accu-Chek Guide Me w/Device Kit 1 Piece by Does not apply route as directed.   Accu-Chek Guide test strip Generic drug: glucose blood Use to test glucose 4 times a day   ADVAIR DISKUS IN Inhale 1 puff into the lungs daily. Rinse mouth after use   albuterol 108 (90 Base) MCG/ACT inhaler Commonly known as: Proventil HFA INHALE 2 PUFFS BY MOUTH EVERY 6 HOURS AS NEEDED FOR COUGHING, WHEEZING, OR SHORTNESS OF BREATH   ARIPiprazole 5 MG tablet Commonly known  as: ABILIFY Take 5 mg by mouth daily.   ARIPiprazole 10 MG tablet Commonly known as: ABILIFY TAKE 1 Tablet BY MOUTH ONCE EVERY NIGHT AT BEDTIME DOSE CHANGE   Aspirin Low Dose 81 MG EC tablet Generic drug: aspirin Take 81 mg by mouth every morning.   atorvastatin 80 MG tablet Commonly known as: LIPITOR TAKE 1 Tablet BY MOUTH ONCE EVERY DAY What changed: See the new instructions.   bacitracin 500 UNIT/GM ointment Apply 1 application topically 2 (two) times daily. Started by: Nicolette Bang, MD   cefdinir 300 MG capsule Commonly known as: OMNICEF Take 1 capsule (300 mg total) by mouth 2 (two) times daily.   doxycycline 100 MG capsule Commonly known as: VIBRAMYCIN Take 1 capsule (100 mg total) by mouth 2 (two) times daily.   famotidine 40 MG tablet Commonly known as: PEPCID Take 1 tablet (40 mg total) by mouth 2 (two) times daily.   glipiZIDE 5 MG 24 hr tablet Commonly known as: GLUCOTROL XL Take 1 tablet (5 mg total) by mouth daily with breakfast.   insulin aspart 100 UNIT/ML injection Commonly known as: NovoLOG Inject 14-20 Units into the skin 3 (three) times daily before meals.   Lantus SoloStar 100 UNIT/ML Solostar Pen Generic drug: insulin glargine Inject 60 Units into the skin at bedtime.   metFORMIN 500 MG tablet Commonly known as: GLUCOPHAGE Take 1 tablet (500 mg total) by mouth 2 (two) times daily with a meal.   metoCLOPramide 5 MG tablet Commonly known as: Reglan Take 1 tablet (5 mg total) by mouth 4 (four) times daily -  before meals and at bedtime.   nitroGLYCERIN 0.4 MG SL tablet Commonly known as: NITROSTAT Place 1 tablet (0.4 mg total) under the tongue every 5 (five) minutes as needed for chest pain.   oxyCODONE-acetaminophen 5-325 MG tablet Commonly known as: Percocet Take 1 tablet by mouth every 4 (four) hours as needed for severe pain.   polyethylene glycol 17 g packet Commonly known as: MIRALAX / GLYCOLAX Take 17 g by mouth daily.    promethazine 25 MG suppository Commonly known as: PHENERGAN Place 1 suppository (25 mg total) rectally every 8 (eight) hours as needed for refractory nausea / vomiting.   sertraline 100 MG tablet Commonly known as: ZOLOFT Take 1 tablet by mouth daily.   traZODone 50 MG tablet Commonly known as: DESYREL Take 50 mg by mouth at bedtime.        Allergies:  Allergies  Allergen Reactions   Omeprazole Magnesium Swelling    Face swells, no breathing impairment   Bee Venom    Esomeprazole Swelling    Face swells, no breathing impairment   Morphine And Related     Itching at IV site, dry heaving, burning sensation in throat.  Family History: Family History  Problem Relation Age of Onset   Congestive Heart Failure Sister    Diabetes Mother    Hypertension Mother    Cancer Mother    Diabetes Father    Hypertension Father    Hyperlipidemia Father     Social History:  reports that he has been smoking cigarettes. He has a 17.00 pack-year smoking history. He has never used smokeless tobacco. He reports that he does not drink alcohol and does not use drugs.  ROS: All other review of systems were reviewed and are negative except what is noted above in HPI  Physical Exam: BP 109/68   Pulse (!) 111   Constitutional:  Alert and oriented, No acute distress. HEENT: [place] AT, moist mucus membranes.  Trachea midline, no masses. Cardiovascular: No clubbing, cyanosis, or edema. Respiratory: Normal respiratory effort, no increased work of breathing. GI: Abdomen is soft, nontender, nondistended, no abdominal masses GU: No CVA tenderness. Circumcised phallus. No masses/lesions on penis, testis, scrotum. Prostate 40g smooth no nodules no induration.  Lymph: No cervical or inguinal lymphadenopathy. Skin: No rashes, bruises or suspicious lesions. Neurologic: Grossly intact, no focal deficits, moving all 4 extremities. Psychiatric: Normal mood and affect.  Laboratory Data: Lab Results   Component Value Date   WBC 7.1 11/11/2020   HGB 15.2 11/11/2020   HCT 45.3 11/11/2020   MCV 94.4 11/11/2020   PLT 200 11/11/2020    Lab Results  Component Value Date   CREATININE 1.09 11/11/2020    No results found for: PSA  No results found for: TESTOSTERONE  Lab Results  Component Value Date   HGBA1C 13.6 (H) 11/11/2020    Urinalysis    Component Value Date/Time   COLORURINE STRAW (A) 11/11/2020 0245   APPEARANCEUR CLEAR 11/11/2020 0245   APPEARANCEUR Clear 10/03/2020 1516   LABSPEC 1.015 11/11/2020 0245   PHURINE 6.0 11/11/2020 0245   GLUCOSEU >=500 (A) 11/11/2020 0245   HGBUR MODERATE (A) 11/11/2020 0245   BILIRUBINUR NEGATIVE 11/11/2020 0245   BILIRUBINUR Negative 10/03/2020 1516   KETONESUR NEGATIVE 11/11/2020 0245   PROTEINUR 100 (A) 11/11/2020 0245   UROBILINOGEN negative (A) 09/03/2020 1148   NITRITE NEGATIVE 11/11/2020 0245   LEUKOCYTESUR NEGATIVE 11/11/2020 0245    Lab Results  Component Value Date   LABMICR See below: 10/03/2020   WBCUA None seen 10/03/2020   LABEPIT 0-10 10/03/2020   BACTERIA RARE (A) 11/11/2020    Pertinent Imaging:  No results found for this or any previous visit.  No results found for this or any previous visit.  No results found for this or any previous visit.  No results found for this or any previous visit.  No results found for this or any previous visit.  No results found for this or any previous visit.  No results found for this or any previous visit.  No results found for this or any previous visit.   Assessment & Plan:    1. Scrotal abscess -bacitracin BID to incisions -RCT 6 weeks for wound check - Urinalysis, Routine w reflex microscopic   Return in about 4 weeks (around 01/07/2021).  Nicolette Bang, MD  [facility] [place]

## 2020-12-16 NOTE — Progress Notes (Signed)
Cardiology Office Note    Date:  12/29/2020   ID:  [name] [name], DOB 09/14/1970, MRN [id number]   PCP:  [place], [place], [place]  Cardiologist:  Rozann Lesches, MD   Advanced Practice Provider:  No care team member to display Electrophysiologist:  None   [phone]   No chief complaint on file.   History of Present Illness:  [name] [name] is a 50 y.o. male with history of CAD with nonobstructive disease on cardiac cath 2016 normal LVEF 55 to 60% Lexiscan 12/2017 no ischemia, hypertension, HLD, DM poorly controlled, family history of premature CAD, tobacco abuse  Was last seen in our office 04/2019 without cardiac complaints.  Hospitalized 08/2020 with scrotal wall abscess and sepsis with AKI.  Patient comes in for f/u. He says he has sharp shooting chest pain that comes and goes. He rubs his chest to help it go away. Also has chest tightness with walking and  Complains of shortness of breath with activity. Drinks 2L mountain dew and 2 cups tea daily. Smokes 1 ppd. Hasn't had cardiac meds in a long time.  Used to be on metoprolol 50 mg twice daily and Imdur 30 mg daily.  Lives with his parents because of mental and physical disabilities per patient and is trying to get disability.   Past Medical History:  Diagnosis Date   Anxiety    Arthritis    CAD (coronary artery disease)    Moderate LAD disease 2016 - Dr. 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   Type 2 diabetes mellitus ([place])     Past Surgical History:  Procedure Laterality Date   ANKLE SURGERY Right 1982   "had extra bones in there; took them  out"   [place]  12/26/2018   Procedure: BIOPSY;  Surgeon: Rogene Houston, MD;  [facility];  Service: Endoscopy;;  duodenal biopsies   BIOPSY  03/23/2019   Procedure: BIOPSY;  Surgeon: Rogene Houston, MD;  [facility];  Service: Endoscopy;;  esophagus   CARDIAC CATHETERIZATION N/A 03/28/2015   Procedure: Left Heart Cath and Coronary Angiography;  Surgeon: Adrian Prows, MD;  Location: [place] [facility];  Service: Cardiovascular;  Laterality: N/A;   CARDIAC CATHETERIZATION N/A 03/28/2015   Procedure: Intravascular Pressure Wire/FFR Study;  Surgeon: Adrian Prows, MD;  Location: [place] [facility];  Service: Cardiovascular;  Laterality: N/A;   CARPAL TUNNEL RELEASE Left ~ 2008   COLONOSCOPY WITH ESOPHAGOGASTRODUODENOSCOPY (EGD)     COLONOSCOPY WITH PROPOFOL N/A 12/24/2019   Procedure: COLONOSCOPY WITH PROPOFOL;  Surgeon: Rogene Houston, MD;  [facility];  Service: Endoscopy;  Laterality: N/A;  955   ELBOW FRACTURE SURGERY Left ~ 2008   ESOPHAGOGASTRODUODENOSCOPY N/A 12/26/2018   Procedure: ESOPHAGOGASTRODUODENOSCOPY (EGD);  Surgeon: Rogene Houston, MD;  [facility];  Service: Endoscopy;  Laterality: N/A;   ESOPHAGOGASTRODUODENOSCOPY (EGD) WITH PROPOFOL N/A 03/23/2019   Procedure: ESOPHAGOGASTRODUODENOSCOPY (EGD) WITH PROPOFOL;  Surgeon: Rogene Houston,  MD;  [facility];  Service: Endoscopy;  Laterality: N/A;  7:30   FRACTURE SURGERY     ankle and elbow   PILONIDAL CYST EXCISION N/A 03/24/2017   Procedure: EXCISION CHRONIC  PILONIDAL ABSCESS;  Surgeon: Coralie Keens, MD;  Location: [facility];  Service: General;  Laterality: N/A;   SCROTAL EXPLORATION N/A 11/13/2020   Procedure: EXCISION OF SEBACEOUS CYSTS, SCROTUM;  Surgeon: Cleon Gustin, MD;  Location: [facility];  Service: Urology;  Laterality: N/A;   TENDON REPAIR Left ~ 2004   "main tendon in my ankle"    Current Medications: Current Meds  Medication  Sig   albuterol (PROVENTIL HFA) 108 (90 Base) MCG/ACT inhaler INHALE 2 PUFFS BY MOUTH EVERY 6 HOURS AS NEEDED FOR COUGHING, WHEEZING, OR SHORTNESS OF BREATH   ARIPiprazole (ABILIFY) 10 MG tablet TAKE 1 Tablet BY MOUTH ONCE EVERY NIGHT AT BEDTIME DOSE CHANGE   ARIPiprazole (ABILIFY) 5 MG tablet Take 5 mg by mouth daily.   ASPIRIN LOW DOSE 81 MG EC tablet Take 81 mg by mouth every morning.   atorvastatin (LIPITOR) 80 MG tablet TAKE 1 Tablet BY MOUTH ONCE EVERY DAY (Patient taking differently: Take 80 mg by mouth daily.)   bacitracin 500 UNIT/GM ointment Apply 1 application topically 2 (two) times daily.   Blood Glucose Monitoring Suppl (ACCU-CHEK GUIDE ME) w/Device KIT 1 Piece by Does not apply route as directed.   famotidine (PEPCID) 40 MG tablet Take 1 tablet (40 mg total) by mouth 2 (two) times daily.   Fluticasone-Salmeterol (ADVAIR DISKUS IN) Inhale 1 puff into the lungs daily. Rinse mouth after use   glipiZIDE (GLUCOTROL XL) 5 MG 24 hr tablet Take 1 tablet (5 mg total) by mouth daily with breakfast.   glucose blood (ACCU-CHEK GUIDE) test strip Use to test glucose 4 times a day   insulin aspart (NOVOLOG) 100 UNIT/ML injection Inject 14-20 Units into the skin 3 (three) times daily before meals.   insulin glargine (LANTUS SOLOSTAR) 100 UNIT/ML Solostar Pen Inject 60 Units into the skin at bedtime.   metFORMIN (GLUCOPHAGE) 500 MG tablet Take 1 tablet (500 mg total) by mouth 2 (two) times daily with a meal.   metoCLOPramide (REGLAN) 5 MG tablet Take 1 tablet (5 mg total) by mouth 4 (four) times daily -  before meals and at bedtime.   metoprolol tartrate (LOPRESSOR) 25 MG tablet Take 1 tablet (25 mg total) by mouth 2 (two) times daily.   nitroGLYCERIN (NITROSTAT) 0.4 MG SL tablet Place 1 tablet (0.4 mg total) under the tongue every 5 (five) minutes as needed for chest pain.   oxyCODONE-acetaminophen (PERCOCET) 5-325 MG tablet Take 1 tablet by mouth every 4 (four) hours as needed for severe pain.    polyethylene glycol (MIRALAX / GLYCOLAX) 17 g packet Take 17 g by mouth daily.   promethazine (PHENERGAN) 25 MG suppository Place 1 suppository (25 mg total) rectally every 8 (eight) hours as needed for refractory nausea / vomiting.   sertraline (ZOLOFT) 100 MG tablet Take 1 tablet by mouth daily.   traZODone (DESYREL) 50 MG tablet Take 50 mg by mouth at bedtime.     Allergies:   Omeprazole magnesium, Bee venom, Esomeprazole, and Morphine and related   Social History   Socioeconomic History   Marital status: Single    Spouse name: Not on file   Number of children: 0   Years of education: 10th g   Highest education level: Not on file  Occupational History  Occupation: [employer] manager  Tobacco Use   Smoking status: Every Day    Packs/day: 0.50    Years: 34.00    Pack years: 17.00    Types: Cigarettes   Smokeless tobacco: Never   Tobacco comments:    1/2 pack a day  Vaping Use   Vaping Use: Never used  Substance and Sexual Activity   Alcohol use: Never    Alcohol/week: 0.0 standard drinks   Drug use: Never   Sexual activity: Not Currently  Other Topics Concern   Not on file  Social History Narrative   His main caretaker, his mother has terminal metastatic cancer.   Social Determinants of Health   Financial Resource Strain: Not on file  Food Insecurity: Not on file  Transportation Needs: Not on file  Physical Activity: Not on file  Stress: Not on file  Social Connections: Not on file     Family History:  The patient's  family history includes Cancer in his mother; Congestive Heart Failure in his sister; Diabetes in his father and mother; Hyperlipidemia in his father; Hypertension in his father and mother.   ROS:   Please see the history of present illness.    ROS All other systems reviewed and are negative.   PHYSICAL EXAM:   VS:  BP 128/76   Pulse (!) 110   Ht '5\' 8"'  (1.727 m)   Wt 158 lb 12.8 oz (72 kg)   SpO2 97%   BMI 24.15 kg/m   Physical Exam   GEN: Well nourished, well developed, in no acute distress  Neck: no JVD, carotid bruits, or masses Cardiac:RRR; no murmurs, rubs, or gallops  Respiratory:  clear to auscultation bilaterally, normal work of breathing GI: soft, nontender, nondistended, + BS Ext: without cyanosis, clubbing, or edema, Good distal pulses bilaterally Neuro:  Alert and Oriented x 3, Psych: euthymic mood, full affect  Wt Readings from Last 3 Encounters:  12/29/20 158 lb 12.8 oz (72 kg)  11/13/20 168 lb (76.2 kg)  11/11/20 (P) 168 lb (76.2 kg)      Studies/Labs Reviewed:   EKG:  EKG is  ordered today.  The ekg ordered today demonstrates sinus tachycardia at 110 bpm with RBBB and left posterior fascicular block unchanged from prior tracings.  Recent Labs: 11/11/2020: ALT 16; BUN 15; Creatinine, Ser 1.09; Hemoglobin 15.2; Platelets 200; Potassium 4.4; Sodium 135   Lipid Panel    Component Value Date/Time   CHOL 277 (H) 11/03/2020 1139   TRIG 413 (H) 11/03/2020 1139   HDL 39 (L) 11/03/2020 1139   CHOLHDL 7.1 11/03/2020 1139   VLDL UNABLE TO CALCULATE IF TRIGLYCERIDE OVER 400 mg/dL 11/03/2020 1139   LDLCALC UNABLE TO CALCULATE IF TRIGLYCERIDE OVER 400 mg/dL 11/03/2020 1139   LDLDIRECT 181.3 (H) 11/03/2020 1139    Additional studies/ records that were reviewed today include:   Lexiscan There was no ST segment deviation noted during stress. Defect 1: There is a medium defect of mild severity present in the mid anteroseptal, mid inferoseptal, mid inferior, apical septal and apical inferior location. This is likely due to soft tissue attenuation. This is a low risk study. No ischemic zones. Nuclear stress EF: 63%.    Cardiac catheterization 03/28/2015: 1. Normal LV systolic function, EF 58-52%. 2. Moderate coronary artery disease, proximal to midsegment of the LAD showing a diffuse 50% stenosis, there is rapid tapering of the LAD from the proximal segment to the midsegment and followed by a hazy 50%  stenosis.  Mid to distal LAD also had a 50% stenosis. FFR to this region was negative for hemodynamic significance. There is minimal disease in the circumflex and RCA. Codominant system. Cardiac catheterization 03/28/2015: 1. Normal LV systolic function, EF 35-68%. 2. Moderate coronary artery disease, proximal to midsegment of the LAD showing a diffuse 50% stenosis, there is rapid tapering of the LAD from the proximal segment to the midsegment and followed by a hazy 50% stenosis. Mid to distal LAD also had a 50% stenosis. FFR to this region was negative for hemodynamic significance. There is minimal disease in the circumflex and RCA. Codominant system.   Risk Assessment/Calculations:         ASSESSMENT:    1. Coronary artery disease involving native coronary artery of native heart without angina pectoris   2. Essential hypertension   3. Uncontrolled type 2 diabetes mellitus with hyperglycemia ([place])   4. Mixed hyperlipidemia   5. Tobacco abuse      PLAN:  In order of problems listed above:  CAD with nonobstructive disease on cardiac cath in 2016, recurrent chest pain with no ischemia on Lexiscan 01/16/2018 multiple CV risk factors that need to be controlled.  Patient continues to have chest pain.  He has chronic sinus tachycardia in reviewing his EKGs.  Previously on metoprolol but has not been on it many years.  We will start 25 mg twice daily.  Lexiscan Myoview to rule out ischemia.  Smoking cessation discussed.  Chronic Sinus tachycardia RBBB/LPFB could be contributing to his exertional symptoms.  We will restart metoprolol 25 mg twice daily and follow-up in 4 to 6 weeks.  Essential hypertension blood pressure controlled.  Diabetes mellitus uncontrolled with elevated hemoglobin A1c 13.6 11/11/2020 drinks 2 L of soda daily.  Managed by PCP.  Hyperlipidemia Lipitor 80 mg daily managed by PCP.  Tobacco abuse smoking cessation discussed.    Shared Decision Making/Informed Consent    Shared Decision Making/Informed Consent The risks [chest pain, shortness of breath, cardiac arrhythmias, dizziness, blood pressure fluctuations, myocardial infarction, stroke/transient ischemic attack, nausea, vomiting, allergic reaction, radiation exposure, metallic taste sensation and life-threatening complications (estimated to be 1 in 10,000)], benefits (risk stratification, diagnosing coronary artery disease, treatment guidance) and alternatives of a nuclear stress test were discussed in detail with Mr. Carias and he agrees to proceed.    Medication Adjustments/Labs and Tests Ordered: Current medicines are reviewed at length with the patient today.  Concerns regarding medicines are outlined above.  Medication changes, Labs and Tests ordered today are listed in the Patient Instructions below. Patient Instructions  Medication Instructions:   START Lopressor 25 mg twice a day    *If you need a refill on your cardiac medications before your next appointment, please call your pharmacy*   Lab Work: None today  If you have labs (blood work) drawn today and your tests are completely normal, you will receive your results only by: [place] (if you have MyChart) OR A paper copy in the mail If you have any lab test that is abnormal or we need to change your treatment, we will call you to review the results.   Testing/Procedures: Your physician has requested that you have a lexiscan myoview. For further information please visit [email or web address]. Please follow instruction sheet, as given.    Follow-Up: At [facility], you and your health needs are our priority.  As part of our continuing mission to provide you with exceptional heart care, we have created designated Provider Care Teams.  These Care  Teams include your primary Cardiologist (physician) and Advanced Practice Providers (APPs -  Physician Assistants and Nurse Practitioners) who all work together to provide you with the  care you need, when you need it.  We recommend signing up for the patient portal called "MyChart".  Sign up information is provided on this After Visit Summary.  MyChart is used to connect with patients for Virtual Visits (Telemedicine).  Patients are able to view lab/test results, encounter notes, upcoming appointments, etc.  Non-urgent messages can be sent to your provider as well.   To learn more about what you can do with MyChart, go to [email or web address].    Your next appointment:   6 week(s)  The format for your next appointment:   In Person  Provider:   You may see Rozann Lesches, MD or one of the following Advanced Practice Providers on your designated Care Team:   Bernerd Pho, PA-C  Ermalinda Barrios, PA-C    Other Instructions   Stop Smoking, decrease caffeine intake   Signed, Ermalinda Barrios, PA-C  12/29/2020 12:52 PM    [address], [place], [place]  [address] Phone: [phone]; Fax: [phone]

## 2020-12-29 ENCOUNTER — Other Ambulatory Visit: Payer: Self-pay

## 2020-12-29 ENCOUNTER — Other Ambulatory Visit: Payer: Self-pay | Admitting: Physician Assistant

## 2020-12-29 ENCOUNTER — Encounter: Payer: Self-pay | Admitting: Physician Assistant

## 2020-12-29 ENCOUNTER — Ambulatory Visit (INDEPENDENT_AMBULATORY_CARE_PROVIDER_SITE_OTHER): Payer: Self-pay | Admitting: Physician Assistant

## 2020-12-29 VITALS — BP 128/76 | HR 110 | Ht 68.0 in | Wt 158.8 lb

## 2020-12-29 DIAGNOSIS — Z720 Tobacco use: Secondary | ICD-10-CM

## 2020-12-29 DIAGNOSIS — I10 Essential (primary) hypertension: Secondary | ICD-10-CM

## 2020-12-29 DIAGNOSIS — E782 Mixed hyperlipidemia: Secondary | ICD-10-CM

## 2020-12-29 DIAGNOSIS — I2510 Atherosclerotic heart disease of native coronary artery without angina pectoris: Secondary | ICD-10-CM

## 2020-12-29 DIAGNOSIS — E1165 Type 2 diabetes mellitus with hyperglycemia: Secondary | ICD-10-CM

## 2020-12-29 MED ORDER — METOPROLOL TARTRATE 25 MG PO TABS: 25.0000 mg | ORAL_TABLET | Freq: Two times a day (BID) | ORAL | 6 refills | Status: DC

## 2020-12-29 NOTE — Patient Instructions (Signed)
Medication Instructions:   START Lopressor 25 mg twice a day    *If you need a refill on your cardiac medications before your next appointment, please call your pharmacy*   Lab Work: None today  If you have labs (blood work) drawn today and your tests are completely normal, you will receive your results only by: MyChart Message (if you have MyChart) OR A paper copy in the mail If you have any lab test that is abnormal or we need to change your treatment, we will call you to review the results.   Testing/Procedures: Your physician has requested that you have a lexiscan myoview. For further information please visit [email or web address] Please follow instruction sheet, as given.    Follow-Up: At [facility], you and your health needs are our priority.  As part of our continuing mission to provide you with exceptional heart care, we have created designated Provider Care Teams.  These Care Teams include your primary Cardiologist (physician) and Advanced Practice Providers (APPs -  Physician Assistants and Nurse Practitioners) who all work together to provide you with the care you need, when you need it.  We recommend signing up for the patient portal called "MyChart".  Sign up information is provided on this After Visit Summary.  MyChart is used to connect with patients for Virtual Visits (Telemedicine).  Patients are able to view lab/test results, encounter notes, upcoming appointments, etc.  Non-urgent messages can be sent to your provider as well.   To learn more about what you can do with MyChart, go to [email or web address].    Your next appointment:   6 week(s)  The format for your next appointment:   In Person  Provider:   You may see Nona Dell, MD or one of the following Advanced Practice Providers on your designated Care Team:   Randall An, PA-C  Jacolyn Reedy, PA-C    Other Instructions   Stop Smoking, decrease caffeine intake

## 2021-01-01 ENCOUNTER — Telehealth: Payer: Self-pay | Admitting: General Surgery

## 2021-01-01 ENCOUNTER — Encounter: Payer: Self-pay | Admitting: Gastroenterology

## 2021-01-01 ENCOUNTER — Other Ambulatory Visit: Payer: Self-pay

## 2021-01-01 ENCOUNTER — Ambulatory Visit (INDEPENDENT_AMBULATORY_CARE_PROVIDER_SITE_OTHER): Payer: Self-pay | Admitting: Gastroenterology

## 2021-01-01 VITALS — BP 110/70 | HR 100 | Ht 68.0 in | Wt 162.1 lb

## 2021-01-01 DIAGNOSIS — K219 Gastro-esophageal reflux disease without esophagitis: Secondary | ICD-10-CM

## 2021-01-01 DIAGNOSIS — K449 Diaphragmatic hernia without obstruction or gangrene: Secondary | ICD-10-CM

## 2021-01-01 DIAGNOSIS — E1140 Type 2 diabetes mellitus with diabetic neuropathy, unspecified: Secondary | ICD-10-CM

## 2021-01-01 DIAGNOSIS — K2270 Barrett's esophagus without dysplasia: Secondary | ICD-10-CM

## 2021-01-01 NOTE — Patient Instructions (Signed)
If you are age 51 or older, your body mass index should be between 23-30. Your Body mass index is 24.65 kg/m. If this is out of the aforementioned range listed, please consider follow up with your Primary Care Provider.  If you are age 32 or younger, your body mass index should be between 19-25. Your Body mass index is 24.65 kg/m. If this is out of the aformentioned range listed, please consider follow up with your Primary Care Provider.   We require a cardiac clearance for your procedure. We will send a request to your cardiologist and will contact you regarding his advice. If you do not hear from [place] 1week prior to your scheduled procedure please contact the office at [phone]  Due to recent changes in healthcare laws, you may see the results of your imaging and laboratory studies on MyChart before your provider has had a chance to review them.  We understand that in some cases there may be results that are confusing or concerning to you. Not all laboratory results come back in the same time frame and the provider may be waiting for multiple results in order to interpret others.  Please give [place] 48 hours in order for your provider to thoroughly review all the results before contacting the office for clarification of your results.   Thank you for choosing me and [facility].  Vito Cirigliano, D.O.

## 2021-01-01 NOTE — Telephone Encounter (Signed)
[facility] Pre-operative Risk Assessment     Request for Cardiacl clearance:     Endoscopy Procedure  What type of surgery is being performed?     EGD  When is this surgery scheduled?     02/25/2021  What type of clearance is required ?   Cardiac  Practice name and name of physician performing surgery?      [facility]- Gerrit Heck, DO  What is your office phone and fax number?      Phone- [phone]  [phone]  Anesthesia type (None, local, MAC, general) ?       MAC

## 2021-01-01 NOTE — Progress Notes (Signed)
Chief Complaint:    GERD, discuss antireflux surgical options  HPI:    Patient is a 50 y.o. male with a history of CAD, COPD, depression, diabetes, tobacco use, diabetic neuropathy, GERD, hyperlipidemia, HTN, migraines, OSA, chronic bronchitis, gout, referred to me by Dr. Jenetta Downer for evaluation of possible antireflux intervention with Transoral Incisionless Fundoplication (TIF) with a goal to stop or significantly reduce acid suppression therapy.  Was previously thought to have some degree of gastroparesis due to uncontrolled diabetes, managed with Reglan and PPI.  Allergic reaction to Nexium and omeprazole (shortness of breath, itching, facial swelling) and transition to H2 blockers.  Has otherwise had reflux symptoms for many years.    Currently prescribed Reglan 5 mg prn- takes about 2 times/week for nausea/vomiting. Has been taking for years.   GERD history: -Index symptoms: Heartburn, regurgitation, dysphagia -Exacerbating features: Spicy food, supine -Medications trialed: Nexium, omeprazole.  Allergy to PPIs.  Pepcid -Current medications: Pepcid 40 mg bid, Reglan -Complications: Barrett's Esophagus  GERD evaluation: -Last EGD: 03/2019 -Barium esophagram: None -Esophageal Manometry: None -pH/Impedance: None -Bravo: None -GES (11/06/2020): Normal  Endoscopic History: - EGD (06/2015): Small HH,1 tongue of salmon-colored mucosa (path: Intestinal metaplasia), normal stomach/duodenum - EGD (11/2018): LA Grade D esophagitis, Barrett's esophagus, duodenitis - EGD (11/2018): 2 cm Short segment Barrett's esophagus, irregular Z-line, 2 cm HH, portal hypertensive gastropathy, normal duodenum   GERD-HRQL Questionnaire Score: 40/50  -CT abdomen/pelvis: 11/11/2020: Normal liver, pancreas, spleen.  Mild thickening of distal thoracic esophagus  Was seen in the Cardiology Clinic on 12/29/2020.  Still smoking.  Chronic sinus tachycardia on EKGs; restarted metoprolol 25 mg bid with plan for  [facility], scheduled for later this month.   Hemoglobin A1c 13.6  Review of systems:     No chest pain, no SOB, no fevers, no urinary sx   Past Medical History:  Diagnosis Date   Anxiety    Arthritis    CAD (coronary artery disease)    Moderate LAD disease 2016 - Dr. 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   Type 2 diabetes mellitus (HCC)     Patient's surgical history, family medical history, social history, medications and allergies were all reviewed in Epic    Current Outpatient Medications  Medication Sig Dispense Refill   albuterol (PROVENTIL HFA) 108 (90 Base) MCG/ACT inhaler INHALE 2 PUFFS BY MOUTH EVERY 6 HOURS AS NEEDED FOR COUGHING, WHEEZING, OR SHORTNESS OF BREATH 20.1 g 0   ARIPiprazole (ABILIFY) 10 MG tablet TAKE 1 Tablet BY MOUTH ONCE EVERY NIGHT AT BEDTIME DOSE CHANGE     ARIPiprazole (ABILIFY) 5 MG tablet Take 5 mg by mouth daily.     ASPIRIN LOW DOSE 81 MG EC tablet Take 81 mg by mouth every morning.     atorvastatin (LIPITOR) 80 MG tablet TAKE 1 Tablet BY MOUTH ONCE EVERY DAY 90 tablet 0   bacitracin 500 UNIT/GM ointment Apply 1 application topically 2 (two) times daily. 15 g 3   Blood Glucose Monitoring Suppl (ACCU-CHEK GUIDE ME) w/Device KIT 1 Piece by Does not  apply route as directed. 1 kit 0   famotidine (PEPCID) 40 MG tablet Take 1 tablet (40 mg total) by mouth 2 (two) times daily. 180 tablet 3   Fluticasone-Salmeterol (ADVAIR DISKUS IN) Inhale 1 puff into the lungs daily. Rinse mouth after use     glipiZIDE (GLUCOTROL XL) 5 MG 24 hr tablet Take 1 tablet (5 mg total) by mouth daily with breakfast. 30 tablet 3   glucose  blood (ACCU-CHEK GUIDE) test strip Use to test glucose 4 times a day 150 each 2   insulin aspart (NOVOLOG) 100 UNIT/ML injection Inject 14-20 Units into the skin 3 (three) times daily before meals. 15 mL 2   insulin glargine (LANTUS SOLOSTAR) 100 UNIT/ML Solostar Pen Inject 60 Units into the skin at bedtime. 30 mL 1   metFORMIN (GLUCOPHAGE) 500 MG tablet Take 1 tablet (500 mg total) by mouth 2 (two) times daily with a meal. 60 tablet 2   metoCLOPramide (REGLAN) 5 MG tablet Take 1 tablet (5 mg total) by mouth 4 (four) times daily -  before meals and at bedtime. 120 tablet 1   metoprolol tartrate (LOPRESSOR) 25 MG tablet Take 1 tablet (25 mg total) by mouth 2 (two) times daily. 60 tablet 6   nitroGLYCERIN (NITROSTAT) 0.4 MG SL tablet Place 1 tablet (0.4 mg total) under the tongue every 5 (five) minutes as needed for chest pain. 25 tablet 3   oxyCODONE-acetaminophen (PERCOCET) 5-325 MG tablet Take 1 tablet by mouth every 4 (four) hours as needed for severe pain. 30 tablet 0   polyethylene glycol (MIRALAX / GLYCOLAX) 17 g packet Take 17 g by mouth daily. 14 each 0   promethazine (PHENERGAN) 25 MG suppository Place 1 suppository (25 mg total) rectally every 8 (eight) hours as needed for refractory nausea / vomiting. 12 each 0   sertraline (ZOLOFT) 100 MG tablet Take 1 tablet by mouth daily.     traZODone (DESYREL) 50 MG tablet Take 50 mg by mouth at bedtime.     No current facility-administered medications for this visit.    Physical Exam:     BP 110/70   Pulse 100   Ht _0  (1.727 m)   Wt 162 lb 2 oz (73.5 kg)   SpO2 98%   BMI 24.65 kg/m   GENERAL:  Pleasant male in NAD PSYCH: : Cooperative, normal affect EENT:  conjunctiva pink, mucous membranes moist, neck supple without masses CARDIAC:  RRR, no murmur heard, no peripheral edema PULM: Normal respiratory effort, lungs CTA bilaterally, no wheezing ABDOMEN:  Nondistended, soft, nontender. No obvious masses, no hepatomegaly,  normal bowel  sounds SKIN:  turgor, no lesions seen Musculoskeletal:  Normal muscle tone, normal strength NEURO: Alert and oriented x 3, no focal neurologic deficits   IMPRESSION and PLAN:    1) GERD 2) Short segment nondysplastic Barrett's Esophagus 3) Hiatal hernia - Longstanding history of reflux complicated by hiatal hernia and short segment nondysplastic Barrett's.  Reflux symptoms incompletely controlled with high-dose Pepcid, and he otherwise is intolerance/allergy to PPI. - Discussed relationship of antireflux surgery and Barrett's Esophagus.  Having Barrett's Esophagus does not preclude him from getting antireflux surgery, specifically TIF.  However, data inconclusive on the ability for antireflux surgery to prevent progression of Barrett's, and therefore I tend to keep patients on acid suppression therapy at least for the time being even after successful surgery - Complicating matters is his poorly controlled diabetes (hemoglobin A1c 13.6).  While gastroparesis is a contraindication to antireflux surgery,  his recent GES was negative/normal.  However, his uncontrolled diabetes certainly places him at elevated risk for developing gastroparesis, which in turn will become problematic in the postoperative setting.  Additionally, I have concerns about poor wound healing with uncontrolled diabetes in the postoperative setting.  He recently had scrotal surgery and remarked on concerns about wound healing with that as well. - We made a tentative plan for repeat upper endoscopy to ensure resolution of esophagitis, remeasure hiatal hernia, and evaluate for any other contraindication/barriers to antireflux surgery.  I did again carefully explain that any attempt at TIF would certainly first require better adherence to diabetic therapy with an improved hemoglobin A1c first. - While he does have clear objective evidence of reflux as manifested by Barrett's Esophagus and prior erosive esophagitis on EGD, I also  explained that depending on EGD findings, could consider Esophageal Manometry and pH/impedance testing in the preoperative work-up - All of the above was discussed in detail with this patient today, and he agrees with this assessment and work-up plan - In the meantime, continue Pepcid as prescribed and antireflux lifestyle/dietary modifications  4) Diabetes - As above, poorly controlled diabetes which increases postoperative risks.  Strongly recommended close follow-up with his PCP and adherence to medical management  5) CAD - Ok to resume ASA 81 mg in the perioperative period  The indications, risks, and benefits of EGD were explained to the patient in detail. Risks include but are not limited to bleeding, perforation, adverse reaction to medications, and cardiopulmonary compromise. Sequelae include but are not limited to the possibility of surgery, hospitalization, and mortality. The patient verbalized understanding and wished to proceed. All questions answered, referred to scheduler. Further recommendations pending results of the exam.              Lavena Bullion ,DO, FACG 01/01/2021, 12:06 PM

## 2021-01-08 NOTE — Telephone Encounter (Signed)
   Patient Name: [name] [name]  DOB: 12-Jun-1970 MRN: [id number]  Primary Cardiologist: Nona Dell, MD  Chart reviewed as part of pre-operative protocol coverage. Patient was recently seen in office for chest pain, pending further cardiac testing. He has follow-up scheduled 02/16/21 to discuss results and further plans. This is in advance of his procedure 02/25/21 so would suggest to keep appt as scheduled. I have added pre-op clearance to appt notes so Elon Jester is aware to review and provide final pre-procedure recommendations. Will route back to requesting CMA to make her aware.  Laurann Montana, PA-C 01/08/2021, 10:12 AM

## 2021-01-09 ENCOUNTER — Other Ambulatory Visit: Payer: Self-pay

## 2021-01-09 ENCOUNTER — Emergency Department (HOSPITAL_COMMUNITY)
Admission: EM | Admit: 2021-01-09 | Discharge: 2021-01-09 | Disposition: A | Discharge status: Home / Self Care | Payer: Self-pay | Attending: Emergency Medicine | Admitting: Emergency Medicine

## 2021-01-09 ENCOUNTER — Encounter (HOSPITAL_COMMUNITY): Payer: Self-pay

## 2021-01-09 DIAGNOSIS — M79672 Pain in left foot: Secondary | ICD-10-CM

## 2021-01-09 DIAGNOSIS — J449 Chronic obstructive pulmonary disease, unspecified: Secondary | ICD-10-CM | POA: Insufficient documentation

## 2021-01-09 DIAGNOSIS — E1042 Type 1 diabetes mellitus with diabetic polyneuropathy: Secondary | ICD-10-CM

## 2021-01-09 DIAGNOSIS — E1010 Type 1 diabetes mellitus with ketoacidosis without coma: Secondary | ICD-10-CM | POA: Insufficient documentation

## 2021-01-09 DIAGNOSIS — E1040 Type 1 diabetes mellitus with diabetic neuropathy, unspecified: Secondary | ICD-10-CM | POA: Insufficient documentation

## 2021-01-09 DIAGNOSIS — Z955 Presence of coronary angioplasty implant and graft: Secondary | ICD-10-CM | POA: Insufficient documentation

## 2021-01-09 DIAGNOSIS — Z7982 Long term (current) use of aspirin: Secondary | ICD-10-CM | POA: Insufficient documentation

## 2021-01-09 DIAGNOSIS — F17210 Nicotine dependence, cigarettes, uncomplicated: Secondary | ICD-10-CM | POA: Insufficient documentation

## 2021-01-09 DIAGNOSIS — I10 Essential (primary) hypertension: Secondary | ICD-10-CM | POA: Insufficient documentation

## 2021-01-09 DIAGNOSIS — Z794 Long term (current) use of insulin: Secondary | ICD-10-CM | POA: Insufficient documentation

## 2021-01-09 DIAGNOSIS — N289 Disorder of kidney and ureter, unspecified: Secondary | ICD-10-CM | POA: Insufficient documentation

## 2021-01-09 DIAGNOSIS — I25119 Atherosclerotic heart disease of native coronary artery with unspecified angina pectoris: Secondary | ICD-10-CM | POA: Insufficient documentation

## 2021-01-09 DIAGNOSIS — Z7984 Long term (current) use of oral hypoglycemic drugs: Secondary | ICD-10-CM | POA: Insufficient documentation

## 2021-01-09 DIAGNOSIS — Z79899 Other long term (current) drug therapy: Secondary | ICD-10-CM | POA: Insufficient documentation

## 2021-01-09 DIAGNOSIS — Z7951 Long term (current) use of inhaled steroids: Secondary | ICD-10-CM | POA: Insufficient documentation

## 2021-01-09 LAB — CBC WITH DIFFERENTIAL/PLATELET
Abs Immature Granulocytes: 0.02 10*3/uL (ref 0.00–0.07)
Basophils Absolute: 0.1 10*3/uL (ref 0.0–0.1)
Basophils Relative: 1 %
Eosinophils Absolute: 0.4 10*3/uL (ref 0.0–0.5)
Eosinophils Relative: 3 %
HCT: 44.4 % (ref 39.0–52.0)
Hemoglobin: 14.6 g/dL (ref 13.0–17.0)
Immature Granulocytes: 0 %
Lymphocytes Relative: 34 %
Lymphs Abs: 3.5 10*3/uL (ref 0.7–4.0)
MCH: 31.2 pg (ref 26.0–34.0)
MCHC: 32.9 g/dL (ref 30.0–36.0)
MCV: 94.9 fL (ref 80.0–100.0)
Monocytes Absolute: 0.7 10*3/uL (ref 0.1–1.0)
Monocytes Relative: 7 %
Neutro Abs: 5.6 10*3/uL (ref 1.7–7.7)
Neutrophils Relative %: 55 %
Platelets: 286 10*3/uL (ref 150–400)
RBC: 4.68 MIL/uL (ref 4.22–5.81)
RDW: 12.8 % (ref 11.5–15.5)
WBC: 10.2 10*3/uL (ref 4.0–10.5)
nRBC: 0 % (ref 0.0–0.2)

## 2021-01-09 LAB — BASIC METABOLIC PANEL
Anion gap: 7 (ref 5–15)
BUN: 27 mg/dL — ABNORMAL HIGH (ref 6–20)
CO2: 26 mmol/L (ref 22–32)
Calcium: 8.2 mg/dL — ABNORMAL LOW (ref 8.9–10.3)
Chloride: 101 mmol/L (ref 98–111)
Creatinine, Ser: 1.5 mg/dL — ABNORMAL HIGH (ref 0.61–1.24)
GFR, Estimated: 56 mL/min — ABNORMAL LOW (ref >60)
Glucose, Bld: 358 mg/dL — ABNORMAL HIGH (ref 70–99)
Potassium: 5 mmol/L (ref 3.5–5.1)
Sodium: 134 mmol/L — ABNORMAL LOW (ref 135–145)

## 2021-01-09 MED ORDER — GABAPENTIN 100 MG PO CAPS
100.0000 mg | ORAL_CAPSULE | Freq: Once | ORAL | Status: AC
  Administered 2021-01-09: 100 mg via ORAL
  Filled 2021-01-09: qty 1

## 2021-01-09 MED ORDER — GABAPENTIN 100 MG PO CAPS: 100.0000 mg | ORAL_CAPSULE | Freq: Three times a day (TID) | ORAL | 0 refills | Status: DC

## 2021-01-09 NOTE — ED Provider Notes (Signed)
[facility] Provider Note   CSN: [id number] Arrival date & time: 01/09/21  0249     History Chief Complaint  Patient presents with   Foot Pain    Cannelton is a 50 y.o. male.  The history is provided by the patient.  Foot Pain He has history of hypertension, diabetes, hyperlipidemia, COPD, coronary artery disease and comes in because of pain in his left foot.  He describes pain is like electric shocks going across his midfoot.  This started about 2 days ago.  Pain is rated at 7/10.  He has tried taking over-the-counter analgesics without relief.  He denies any trauma.  He relates surgery on his foot for ligament problems and states that the pain is similar to what he had before the surgery.   Past Medical History:  Diagnosis Date   Anxiety    Arthritis    CAD (coronary artery disease)    Moderate LAD disease 2016 - Dr. 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   Type 2 diabetes mellitus ([place])     Patient Active Problem List   Diagnosis Date Noted   Scrotal abscess 11/19/2020   Severe sepsis ([place]) 09/03/2020   Personal history of noncompliance with medical treatment, presenting hazards to health 05/27/2020   Barrett's esophagus 01/23/2019   Diarrhea 01/23/2019   MDD (major depressive disorder), severe ([place]) 12/17/2018   DKA, type 2 ([place]) 11/07/2018   High anion gap metabolic acidosis 20/80/2233   Epigastric abdominal pain 03/22/2018   Nausea vomiting and diarrhea 03/22/2018   AKI (acute kidney injury) ([place]) 03/22/2018   Uncontrolled type 2 diabetes mellitus with hyperglycemia ([place]) 03/22/2018   Diabetic  gastroparesis ([place]) 06/02/2017   ARF (acute renal failure) ([place]) 05/30/2017   Mixed hyperlipidemia 03/31/2017   Diabetic polyneuropathy ([place]) 03/31/2017   Vitamin B12 deficiency 03/31/2017   Vitamin D deficiency 03/31/2017   Right bundle branch block 02/14/2017   DKA (diabetic ketoacidoses) 10/15/2016   Gastroesophageal reflux disease 10/15/2016   Benign hypertension 10/15/2016   Leukocytosis 11/25/2015   Intractable nausea and vomiting 11/25/2015   Type 2 diabetes mellitus with hyperglycemia, with long-term current use of insulin ([place]) 11/25/2015   Dental abscess 11/25/2015   Nausea and vomiting 11/25/2015   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 ([place]) 05/02/2015   Coronary arteriosclerosis 03/28/2015   Chest pain 03/27/2015   Anxiety disorder 02/27/2015   Smoking 02/27/2015   Obesity 02/27/2015   Obstructive sleep apnea 02/27/2015    Past Surgical History:  Procedure Laterality Date   ANKLE SURGERY Right 1982   "had extra bones in there; took them out"   [place]  12/26/2018   Procedure: BIOPSY;  Surgeon: Rogene Houston, MD;  [facility];  Service: Endoscopy;;  duodenal biopsies   BIOPSY  03/23/2019   Procedure: BIOPSY;  Surgeon: Rogene Houston, MD;  [facility];  Service: Endoscopy;;  esophagus   CARDIAC CATHETERIZATION N/A 03/28/2015   Procedure: Left Heart Cath and Coronary Angiography;  Surgeon: Adrian Prows, MD;  Location: [facility];  Service: Cardiovascular;  Laterality: N/A;   CARDIAC CATHETERIZATION N/A 03/28/2015   Procedure: Intravascular Pressure Wire/FFR Study;  Surgeon: Adrian Prows, MD;  Location: [place] [facility];  Service: Cardiovascular;  Laterality: N/A;   CARPAL TUNNEL RELEASE Left ~ 2008   COLONOSCOPY WITH ESOPHAGOGASTRODUODENOSCOPY (EGD)     COLONOSCOPY WITH PROPOFOL N/A 12/24/2019   Procedure: COLONOSCOPY WITH PROPOFOL;  Surgeon: Rogene Houston, MD;  [facility];  Service: Endoscopy;  Laterality: N/A;  955   ELBOW FRACTURE SURGERY Left ~ 2008   ESOPHAGOGASTRODUODENOSCOPY N/A 12/26/2018   Procedure: ESOPHAGOGASTRODUODENOSCOPY (EGD);  Surgeon: Rogene Houston, MD;  [facility];  Service: Endoscopy;  Laterality: N/A;   ESOPHAGOGASTRODUODENOSCOPY (EGD) WITH PROPOFOL N/A 03/23/2019   Procedure: ESOPHAGOGASTRODUODENOSCOPY (EGD) WITH PROPOFOL;  Surgeon: Rogene Houston, MD;  [facility];  Service: Endoscopy;  Laterality: N/A;  7:30   FRACTURE SURGERY     ankle and elbow   PILONIDAL CYST EXCISION N/A 03/24/2017   Procedure: EXCISION CHRONIC  PILONIDAL ABSCESS;  Surgeon: Coralie Keens, MD;  Location: [facility];  Service: General;  Laterality: N/A;   SCROTAL EXPLORATION N/A 11/13/2020   Procedure: EXCISION OF SEBACEOUS CYSTS, SCROTUM;  Surgeon: Cleon Gustin, MD;  Location: [facility];  Service: Urology;  Laterality: N/A;   TENDON REPAIR Left ~ 2004   "main tendon in my ankle"       Family History  Problem Relation Age of Onset   Diabetes Mother    Hypertension Mother    Cancer Mother    Throat cancer Mother    Diabetes Father    Hypertension Father    Hyperlipidemia Father    Congestive Heart Failure Sister    Colon cancer Neg Hx    Pancreatic cancer Neg Hx    Liver disease Neg Hx     Social History   Tobacco Use   Smoking status: Every Day    Packs/day: 0.50    Years: 34.00    Pack years: 17.00    Types: Cigarettes   Smokeless tobacco: Never   Tobacco comments:    1/2 pack a day  Vaping Use   Vaping Use: Never used  Substance Use Topics   Alcohol use: Never    Alcohol/week: 0.0 standard drinks   Drug use: Never    Home Medications Prior to Admission medications   Medication Sig Start Date End Date Taking? Authorizing Provider  albuterol (PROVENTIL HFA) 108 (90 Base) MCG/ACT inhaler INHALE 2 PUFFS BY MOUTH EVERY 6 HOURS AS NEEDED FOR COUGHING, WHEEZING, OR SHORTNESS OF BREATH 12/29/20   Soyla Dryer, PA-C  ARIPiprazole (ABILIFY) 10 MG tablet TAKE 1 Tablet BY MOUTH ONCE EVERY NIGHT AT BEDTIME DOSE CHANGE 11/10/20   [provider]  ARIPiprazole (ABILIFY) 5 MG tablet Take 5 mg by mouth daily.    [provider]  ASPIRIN LOW DOSE 81 MG EC tablet Take 81 mg by mouth every morning. 09/05/20   [provider]  atorvastatin (LIPITOR) 80 MG tablet TAKE 1 Tablet BY MOUTH ONCE EVERY DAY 12/29/20   Soyla Dryer, PA-C  bacitracin 500 UNIT/GM ointment Apply 1 application topically 2 (two) times daily. 12/10/20   McKenzie, Candee Furbish, MD  Blood Glucose Monitoring Suppl (ACCU-CHEK GUIDE ME) w/Device KIT 1 Piece by Does not apply route as directed. 11/10/20   Cassandria Anger, MD  famotidine (PEPCID) 40 MG tablet Take 1 tablet (  40 mg total) by mouth 2 (two) times daily. 10/16/20   Harvel Quale, MD  Fluticasone-Salmeterol (ADVAIR DISKUS IN) Inhale 1 puff into the lungs daily. Rinse mouth after use    [provider]  glipiZIDE (GLUCOTROL XL) 5 MG 24 hr tablet Take 1 tablet (5 mg total) by mouth daily with breakfast. 11/10/20   Nida, Marella Chimes, MD  glucose blood (ACCU-CHEK GUIDE) test strip Use to test glucose 4 times a day 11/10/20   Cassandria Anger, MD  insulin aspart (NOVOLOG) 100 UNIT/ML injection Inject 14-20 Units into the skin 3 (three) times daily before meals. 05/08/20   Cassandria Anger, MD  insulin glargine (LANTUS SOLOSTAR) 100 UNIT/ML Solostar Pen Inject 60 Units into the skin at bedtime. 11/10/20   Cassandria Anger, MD  metFORMIN (GLUCOPHAGE) 500 MG tablet Take 1 tablet (500 mg total) by mouth 2 (two) times daily with a meal. 11/10/20   Nida, Marella Chimes, MD  metoCLOPramide (REGLAN) 5 MG tablet Take 1 tablet (5 mg total) by mouth 4 (four) times daily -  before meals and at bedtime. 11/22/19   Laurine Blazer B, PA-C  metoprolol tartrate (LOPRESSOR) 25 MG tablet Take 1 tablet (25 mg total) by mouth 2 (two) times daily. 12/29/20    Imogene Burn, PA-C  nitroGLYCERIN (NITROSTAT) 0.4 MG SL tablet Place 1 tablet (0.4 mg total) under the tongue every 5 (five) minutes as needed for chest pain. 10/05/17   Soyla Dryer, PA-C  oxyCODONE-acetaminophen (PERCOCET) 5-325 MG tablet Take 1 tablet by mouth every 4 (four) hours as needed for severe pain. 11/19/20 11/19/21  McKenzie, Candee Furbish, MD  polyethylene glycol (MIRALAX / GLYCOLAX) 17 g packet Take 17 g by mouth daily. 11/11/20   Orpah Greek, MD  promethazine (PHENERGAN) 25 MG suppository Place 1 suppository (25 mg total) rectally every 8 (eight) hours as needed for refractory nausea / vomiting. 12/20/18   Barton Dubois, MD  sertraline (ZOLOFT) 100 MG tablet Take 1 tablet by mouth daily. 01/09/20   [provider]  traZODone (DESYREL) 50 MG tablet Take 50 mg by mouth at bedtime. 10/03/20   [provider]    Allergies    Omeprazole magnesium, Bee venom, Esomeprazole, and Morphine and related  Review of Systems   Review of Systems  All other systems reviewed and are negative.  Physical Exam Updated Vital Signs BP (!) 166/105 (BP Location: Right Arm)   Pulse (!) 110   Temp 98.3 F (36.8 C) (Oral)   Resp 18   Ht $R'5\' 8"'Yt$  (1.727 m)   Wt 74.8 kg   SpO2 99%   BMI 25.09 kg/m   Physical Exam Vitals and nursing note reviewed.  50 year old male, resting comfortably and in no acute distress. Vital signs are significant for elevated blood pressure and mildly elevated heart rate. Oxygen saturation is 99%, which is normal. Head is normocephalic and atraumatic. PERRLA, EOMI. Oropharynx is clear. Neck is nontender and supple without adenopathy or JVD. Back is nontender and there is no CVA tenderness. Lungs are clear without rales, wheezes, or rhonchi. Chest is nontender. Heart has regular rate and rhythm without murmur. Abdomen is soft, flat, nontender without masses or hepatosplenomegaly and peristalsis is normoactive. Extremities have no cyanosis or  edema, full range of motion is present.  Dorsalis pedis pulses 2+.  Capillary refill is prompt. Skin is warm and dry without rash. Neurologic: Mental status is normal, cranial nerves are intact, moves all extremities equally.  Peripheral neuropathy is present with normal sensation beginning at about mid calf.  ED Results / Procedures / Treatments   Labs (all labs ordered are listed, but only abnormal results are displayed) Labs Reviewed  BASIC METABOLIC PANEL - Abnormal; Notable for the following components:      Result Value   Sodium 134 (*)    Glucose, Bld 358 (*)    BUN 27 (*)    Creatinine, Ser 1.50 (*)    Calcium 8.2 (*)    GFR, Estimated 56 (*)    All other components within normal limits  CBC WITH DIFFERENTIAL/PLATELET   Procedures Procedures   Medications Ordered in ED Medications - No data to display  ED Course  I have reviewed the triage vital signs and the nursing notes.  Pertinent labs & imaging results that were available during my care of the patient were reviewed by me and considered in my medical decision making (see chart for details).   MDM Rules/Calculators/A&P                         Left foot pain which I believe is related to his diabetic peripheral neuropathy.  We will check screening labs and start him on gabapentin.  Of note, he had told the triage nurse that he has had occasional suicidal thoughts.  He does admit to suicidal thoughts which he has had for a long time, but has no specific plan and specifically states that he has never attempted to commit suicide and would never commit suicide because he would not want to put his parents through that pain.  I do not feel he needs urgent psychiatric evaluation.  He is followed at [facility] and will need to follow-up with them.  Labs show modest increase in creatinine compared with recent values, but not out of line with where creatinine has been in the past.  Patient is encouraged to drink fluids.  He is  discharged with prescription for gabapentin and referred back to his primary care provider for titration of gabapentin dose.  Return precautions discussed.  Final Clinical Impression(s) / ED Diagnoses Final diagnoses:  Left foot pain  Diabetic peripheral neuropathy associated with type 1 diabetes mellitus ([place])  Renal insufficiency    Rx / DC Orders ED Discharge Orders          Ordered    gabapentin (NEURONTIN) 100 MG capsule  3 times daily        01/09/21 3888             Delora Fuel, MD 75/79/72 [phone]

## 2021-01-09 NOTE — Discharge Instructions (Addendum)
I believe your foot pain is related to neuropathy from your diabetes.  Please take the gabapentin as prescribed.  Please work with your primary care provider to adjust the dose.  The dose frequently would need to be increased to get the maximum benefit.  Please call [facility] services for a follow-up appointment as soon as possible.  If your suicidal thoughts get worse, please return to the emergency department.  Please make sure to drink enough fluids.

## 2021-01-09 NOTE — ED Triage Notes (Addendum)
POV complaining of left sided foot pain that feels like "shocks". Been this way the past two days. Pt had previous surgery on a ligament in foot. Pt states it feels like it did before he had his surgery.

## 2021-01-13 ENCOUNTER — Encounter (HOSPITAL_COMMUNITY): Payer: Self-pay

## 2021-01-13 ENCOUNTER — Ambulatory Visit (HOSPITAL_COMMUNITY)
Admission: RE | Admit: 2021-01-13 | Discharge: 2021-01-13 | Disposition: A | Discharge status: Home / Self Care | Payer: Self-pay | Source: Ambulatory Visit | Attending: Physician Assistant | Admitting: Physician Assistant

## 2021-01-13 ENCOUNTER — Encounter (HOSPITAL_COMMUNITY)
Admission: RE | Admit: 2021-01-13 | Discharge: 2021-01-13 | Disposition: A | Discharge status: Home / Self Care | Payer: Self-pay | Source: Ambulatory Visit | Attending: Physician Assistant | Admitting: Physician Assistant

## 2021-01-13 ENCOUNTER — Other Ambulatory Visit: Payer: Self-pay

## 2021-01-13 DIAGNOSIS — I2510 Atherosclerotic heart disease of native coronary artery without angina pectoris: Secondary | ICD-10-CM

## 2021-01-13 DIAGNOSIS — I10 Essential (primary) hypertension: Secondary | ICD-10-CM

## 2021-01-13 HISTORY — DX: Unspecified asthma, uncomplicated: J45.909

## 2021-01-13 LAB — NM MYOCAR MULTI W/SPECT W/WALL MOTION / EF
LV dias vol: 72 mL (ref 62–150)
LV sys vol: 31 mL
Peak HR: 116 {beats}/min
RATE: 0.33
Rest HR: 96 {beats}/min
SDS: 7
SRS: 1
SSS: 8
TID: 1.07

## 2021-01-13 MED ORDER — REGADENOSON 0.4 MG/5ML IV SOLN
INTRAVENOUS | Status: AC
  Administered 2021-01-13: 0.4 mg via INTRAVENOUS
  Filled 2021-01-13: qty 5

## 2021-01-13 MED ORDER — SODIUM CHLORIDE FLUSH 0.9 % IV SOLN
INTRAVENOUS | Status: AC
  Administered 2021-01-13: 10 mL via INTRAVENOUS
  Filled 2021-01-13: qty 10

## 2021-01-13 MED ORDER — TECHNETIUM TC 99M TETROFOSMIN IV KIT
30.0000 | PACK | Freq: Once | INTRAVENOUS | Status: AC | PRN
  Administered 2021-01-13: 33 via INTRAVENOUS

## 2021-01-13 MED ORDER — TECHNETIUM TC 99M TETROFOSMIN IV KIT
10.0000 | PACK | Freq: Once | INTRAVENOUS | Status: AC | PRN
  Administered 2021-01-13: 11 via INTRAVENOUS

## 2021-01-14 ENCOUNTER — Ambulatory Visit (INDEPENDENT_AMBULATORY_CARE_PROVIDER_SITE_OTHER): Payer: Self-pay | Admitting: "Endocrinology

## 2021-01-14 ENCOUNTER — Encounter: Payer: Self-pay | Admitting: "Endocrinology

## 2021-01-14 VITALS — BP 110/76 | HR 104 | Ht 68.0 in | Wt 163.6 lb

## 2021-01-14 DIAGNOSIS — Z9119 Patient's noncompliance with other medical treatment and regimen: Secondary | ICD-10-CM

## 2021-01-14 DIAGNOSIS — E1165 Type 2 diabetes mellitus with hyperglycemia: Secondary | ICD-10-CM

## 2021-01-14 DIAGNOSIS — F17200 Nicotine dependence, unspecified, uncomplicated: Secondary | ICD-10-CM

## 2021-01-14 DIAGNOSIS — Z91199 Patient's noncompliance with other medical treatment and regimen due to unspecified reason: Secondary | ICD-10-CM

## 2021-01-14 DIAGNOSIS — E782 Mixed hyperlipidemia: Secondary | ICD-10-CM

## 2021-01-14 MED ORDER — NOVOLOG MIX 70/30 FLEXPEN (70-30) 100 UNIT/ML ~~LOC~~ SUPN: 40.0000 [IU] | PEN_INJECTOR | Freq: Two times a day (BID) | SUBCUTANEOUS | 2 refills | Status: DC

## 2021-01-14 NOTE — Progress Notes (Signed)
01/14/2021, 12:39 PM  Endocrinology follow-up note   Subjective:    Patient ID: [name] [name], male    DOB: 05-08-1971.  [name] [name] is being seen in follow-up after he was seen in consultation for management of currently uncontrolled symptomatic diabetes requested by  Soyla Dryer, PA-C.   Past Medical History:  Diagnosis Date   Anxiety    Arthritis    Asthma    CAD (coronary artery disease)    Moderate LAD disease 2016 - Dr. 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   Type 2 diabetes mellitus ([place])     Past Surgical History:  Procedure Laterality Date   ANKLE SURGERY Right 1982   "had extra bones in there; took them out"   [place]  12/26/2018   Procedure: BIOPSY;  Surgeon: Rogene Houston, MD;  [facility];  Service: Endoscopy;;  duodenal biopsies   BIOPSY  03/23/2019   Procedure: BIOPSY;  Surgeon: Rogene Houston, MD;  [facility];  Service: Endoscopy;;  esophagus   CARDIAC CATHETERIZATION N/A 03/28/2015   Procedure: Left Heart Cath and Coronary Angiography;  Surgeon: Adrian Prows, MD;  Location: [place] [facility];  Service: Cardiovascular;  Laterality: N/A;   CARDIAC CATHETERIZATION N/A 03/28/2015   Procedure: Intravascular Pressure Wire/FFR Study;  Surgeon: Adrian Prows, MD;  Location: [place] [facility];  Service: Cardiovascular;  Laterality: N/A;   CARPAL TUNNEL RELEASE Left ~ 2008   COLONOSCOPY WITH ESOPHAGOGASTRODUODENOSCOPY (EGD)     COLONOSCOPY WITH PROPOFOL N/A 12/24/2019   Procedure: COLONOSCOPY WITH  PROPOFOL;  Surgeon: Rogene Houston, MD;  [facility];  Service: Endoscopy;  Laterality: N/A;  955   ELBOW FRACTURE SURGERY Left ~ 2008   ESOPHAGOGASTRODUODENOSCOPY N/A 12/26/2018   Procedure: ESOPHAGOGASTRODUODENOSCOPY (EGD);  Surgeon: Rogene Houston, MD;  [facility];  Service: Endoscopy;  Laterality: N/A;   ESOPHAGOGASTRODUODENOSCOPY (EGD) WITH PROPOFOL N/A 03/23/2019   Procedure: ESOPHAGOGASTRODUODENOSCOPY (EGD) WITH PROPOFOL;  Surgeon: Rogene Houston, MD;  [facility];  Service: Endoscopy;  Laterality: N/A;  7:30   FRACTURE SURGERY     ankle and elbow   PILONIDAL CYST EXCISION N/A 03/24/2017   Procedure: EXCISION CHRONIC  PILONIDAL ABSCESS;  Surgeon: Coralie Keens, MD;  Location: [facility];  Service: General;  Laterality: N/A;   SCROTAL EXPLORATION N/A 11/13/2020   Procedure: EXCISION OF SEBACEOUS CYSTS, SCROTUM;  Surgeon: Cleon Gustin, MD;  Location: [facility];  Service: Urology;  Laterality: N/A;   TENDON REPAIR Left ~ 2004   "main tendon in my ankle"    Social History   Socioeconomic History   Marital status: Single  Spouse name: Not on file   Number of children: 0   Years of education: 10th g   Highest education level: Not on file  Occupational History   Occupation: [employer]  Tobacco Use   Smoking status: Every Day    Packs/day: 0.50    Years: 34.00    Pack years: 17.00    Types: Cigarettes   Smokeless tobacco: Never   Tobacco comments:    1/2 pack a day  Vaping Use   Vaping Use: Never used  Substance and Sexual Activity   Alcohol use: Never    Alcohol/week: 0.0 standard drinks   Drug use: Never   Sexual activity: Not Currently  Other Topics Concern   Not on file  Social History Narrative   His main caretaker, his mother has terminal metastatic cancer.   Social Determinants of Health   Financial Resource Strain: Not on file  Food Insecurity: Not on file  Transportation Needs: Not on file  Physical  Activity: Not on file  Stress: Not on file  Social Connections: Not on file    Family History  Problem Relation Age of Onset   Diabetes Mother    Hypertension Mother    Cancer Mother    Throat cancer Mother    Diabetes Father    Hypertension Father    Hyperlipidemia Father    Congestive Heart Failure Sister    Colon cancer Neg Hx    Pancreatic cancer Neg Hx    Liver disease Neg Hx     Outpatient Encounter Medications as of 01/14/2021  Medication Sig   insulin aspart protamine - aspart (NOVOLOG MIX 70/30 FLEXPEN) (70-30) 100 UNIT/ML FlexPen Inject 40 Units into the skin 2 (two) times daily with a meal. Only with breakfast and supper when premeal glucose is above 90   albuterol (PROVENTIL HFA) 108 (90 Base) MCG/ACT inhaler INHALE 2 PUFFS BY MOUTH EVERY 6 HOURS AS NEEDED FOR COUGHING, WHEEZING, OR SHORTNESS OF BREATH   ARIPiprazole (ABILIFY) 10 MG tablet TAKE 1 Tablet BY MOUTH ONCE EVERY NIGHT AT BEDTIME DOSE CHANGE   ARIPiprazole (ABILIFY) 5 MG tablet Take 5 mg by mouth daily.   ASPIRIN LOW DOSE 81 MG EC tablet Take 81 mg by mouth every morning.   atorvastatin (LIPITOR) 80 MG tablet TAKE 1 Tablet BY MOUTH ONCE EVERY DAY   bacitracin 500 UNIT/GM ointment Apply 1 application topically 2 (two) times daily.   Blood Glucose Monitoring Suppl (ACCU-CHEK GUIDE ME) w/Device KIT 1 Piece by Does not apply route as directed.   famotidine (PEPCID) 40 MG tablet Take 1 tablet (40 mg total) by mouth 2 (two) times daily.   Fluticasone-Salmeterol (ADVAIR DISKUS IN) Inhale 1 puff into the lungs daily. Rinse mouth after use   gabapentin (NEURONTIN) 100 MG capsule Take 1 capsule (100 mg total) by mouth 3 (three) times daily.   glipiZIDE (GLUCOTROL XL) 5 MG 24 hr tablet Take 1 tablet (5 mg total) by mouth daily with breakfast.   glucose blood (ACCU-CHEK GUIDE) test strip Use to test glucose 4 times a day   metFORMIN (GLUCOPHAGE) 500 MG tablet Take 1 tablet (500 mg total) by mouth 2 (two) times daily with a  meal.   metoCLOPramide (REGLAN) 5 MG tablet Take 1 tablet (5 mg total) by mouth 4 (four) times daily -  before meals and at bedtime.   metoprolol tartrate (LOPRESSOR) 25 MG tablet Take 1 tablet (25 mg total) by mouth 2 (two) times daily.  nitroGLYCERIN (NITROSTAT) 0.4 MG SL tablet Place 1 tablet (0.4 mg total) under the tongue every 5 (five) minutes as needed for chest pain.   oxyCODONE-acetaminophen (PERCOCET) 5-325 MG tablet Take 1 tablet by mouth every 4 (four) hours as needed for severe pain.   polyethylene glycol (MIRALAX / GLYCOLAX) 17 g packet Take 17 g by mouth daily.   promethazine (PHENERGAN) 25 MG suppository Place 1 suppository (25 mg total) rectally every 8 (eight) hours as needed for refractory nausea / vomiting.   sertraline (ZOLOFT) 100 MG tablet Take 1 tablet by mouth daily.   traZODone (DESYREL) 50 MG tablet Take 50 mg by mouth at bedtime.   [DISCONTINUED] insulin aspart (NOVOLOG) 100 UNIT/ML injection Inject 14-20 Units into the skin 3 (three) times daily before meals.   [DISCONTINUED] insulin glargine (LANTUS SOLOSTAR) 100 UNIT/ML Solostar Pen Inject 60 Units into the skin at bedtime.   No facility-administered encounter medications on file as of 01/14/2021.    ALLERGIES: Allergies  Allergen Reactions   Omeprazole Magnesium Swelling    Face swells, no breathing impairment   Bee Venom    Esomeprazole Swelling    Face swells, no breathing impairment   Morphine And Related     Itching at IV site, dry heaving, burning sensation in throat.    VACCINATION STATUS: Immunization History  Administered Date(s) Administered   Influenza-Unspecified 03/13/2019   Moderna Sars-Covid-2 Vaccination 09/11/2019, 10/11/2019   Pneumococcal Polysaccharide-23 05/25/2017    Diabetes He presents for his follow-up diabetic visit. He has type 2 diabetes mellitus. Onset time: He was diagnosed at approximate age of 59 years. His disease course has been worsening. There are no hypoglycemic  associated symptoms. Pertinent negatives for hypoglycemia include no confusion, headaches, pallor or seizures. Associated symptoms include polydipsia and polyuria. Pertinent negatives for diabetes include no chest pain, no fatigue, no polyphagia and no weakness. There are no hypoglycemic complications. Symptoms are improving. Diabetic complications include heart disease. Risk factors for coronary artery disease include diabetes mellitus, dyslipidemia, male sex, hypertension, family history, tobacco exposure and sedentary lifestyle. Current diabetic treatment includes insulin injections. His weight is fluctuating minimally. He is following a generally unhealthy diet. When asked about meal planning, he reported none. He rarely participates in exercise. His home blood glucose trend is increasing steadily. His breakfast blood glucose range is generally 180-200 mg/dl. His lunch blood glucose range is generally >200 mg/dl. His dinner blood glucose range is generally >200 mg/dl. His bedtime blood glucose range is generally >200 mg/dl. His overall blood glucose range is >200 mg/dl. [name] [name] presents with his logs showing fluctuating glycemic profile, still significantly hyperglycemic.  His recent A1c was 13.6% in June although improving from greater than 15.5% previously.  He is not following her insulin regimen and schedule prescribed.  This is mainly because he is 1-2 times daily and rare for him to eat 3 meals a day. ) An ACE inhibitor/angiotensin II receptor blocker is being taken. He does not see a podiatrist.Eye exam is not current.  Hyperlipidemia This is a chronic problem. The current episode started more than 1 year ago. The problem is uncontrolled. Exacerbating diseases include diabetes. Pertinent negatives include no chest pain, myalgias or shortness of breath. Risk factors for coronary artery disease include dyslipidemia, diabetes mellitus, family history, male sex, hypertension and a sedentary lifestyle.   Hypertension This is a chronic problem. The current episode started more than 1 year ago. The problem is controlled. Pertinent negatives include no chest pain, headaches, neck pain, palpitations  or shortness of breath. Risk factors for coronary artery disease include diabetes mellitus, dyslipidemia, family history, male gender, sedentary lifestyle and smoking/tobacco exposure. Past treatments include direct vasodilators. Hypertensive end-organ damage includes CAD/MI.    Review of Systems  Constitutional:  Negative for chills, fatigue, fever and unexpected weight change.  HENT:  Negative for dental problem, mouth sores and trouble swallowing.   Eyes:  Negative for visual disturbance.  Respiratory:  Negative for cough, choking, chest tightness, shortness of breath and wheezing.   Cardiovascular:  Negative for chest pain, palpitations and leg swelling.  Gastrointestinal:  Negative for abdominal distention, abdominal pain, constipation, diarrhea, nausea and vomiting.  Endocrine: Positive for polydipsia and polyuria. Negative for polyphagia.  Genitourinary:  Negative for dysuria, flank pain, hematuria and urgency.  Musculoskeletal:  Negative for back pain, gait problem, myalgias and neck pain.  Skin:  Negative for pallor, rash and wound.  Neurological:  Negative for seizures, syncope, weakness, numbness and headaches.  Psychiatric/Behavioral:  Negative for confusion and dysphoric mood.    Objective:    Vitals with BMI 01/14/2021 01/09/2021 01/09/2021  Height '5\' 8"'  - -  Weight 163 lbs 10 oz - -  BMI 49.67 - -  Systolic 591 638 466  Diastolic 76 599 95  Pulse 104 98 102    BP 110/76   Pulse (!) 104   Ht '5\' 8"'  (1.727 m)   Wt 163 lb 9.6 oz (74.2 kg)   BMI 24.88 kg/m   Wt Readings from Last 3 Encounters:  01/14/21 163 lb 9.6 oz (74.2 kg)  01/09/21 165 lb (74.8 kg)  01/01/21 162 lb 2 oz (73.5 kg)    Reluctant affect   CMP     Component Value Date/Time   NA 134 (L) 01/09/2021 0330    K 5.0 01/09/2021 0330   CL 101 01/09/2021 0330   CO2 26 01/09/2021 0330   GLUCOSE 358 (H) 01/09/2021 0330   BUN 27 (H) 01/09/2021 0330   CREATININE 1.50 (H) 01/09/2021 0330   CREATININE 1.05 01/23/2019 0927   CALCIUM 8.2 (L) 01/09/2021 0330   PROT 6.5 11/11/2020 0245   ALBUMIN 3.1 (L) 11/11/2020 0245   AST 21 11/11/2020 0245   ALT 16 11/11/2020 0245   ALKPHOS 78 11/11/2020 0245   BILITOT 0.3 11/11/2020 0245   GFRNONAA 56 (L) 01/09/2021 0330   GFRNONAA 83 01/23/2019 0927   GFRAA >60 01/11/2020 0859   GFRAA 96 01/23/2019 0927     Diabetic Labs (most recent): Lab Results  Component Value Date   HGBA1C 13.6 (H) 11/11/2020   HGBA1C >15.5 (H) 09/03/2020   HGBA1C 14.6 (A) 05/08/2020     Lipid Panel ( most recent) Lipid Panel     Component Value Date/Time   CHOL 277 (H) 11/03/2020 1139   TRIG 413 (H) 11/03/2020 1139   HDL 39 (L) 11/03/2020 1139   CHOLHDL 7.1 11/03/2020 1139   VLDL UNABLE TO CALCULATE IF TRIGLYCERIDE OVER 400 mg/dL 11/03/2020 1139   LDLCALC UNABLE TO CALCULATE IF TRIGLYCERIDE OVER 400 mg/dL 11/03/2020 1139   LDLDIRECT 181.3 (H) 11/03/2020 1139      Lab Results  Component Value Date   TSH 1.772 05/24/2017   TSH 4.778 (H) 11/25/2015     Assessment & Plan:   1. Uncontrolled type 2 diabetes mellitus with hyperglycemia ([place])   - [name] [name] has currently uncontrolled symptomatic type 2 DM since  50 years of age. Recent labs reviewed.  [name] [name] presents with his logs showing fluctuating glycemic  profile, still significantly hyperglycemic.  His recent A1c was 13.6% in June although improving from greater than 15.5% previously.  He is not following her insulin regimen and schedule prescribed.  This is mainly because he is 1-2 times daily and rare for him to eat 3 meals a day.    - I had a long discussion with him about the progressive nature of diabetes and the pathology behind its complications. -his diabetes is complicated by coronary artery disease and  he remains at a high risk for more acute and chronic complications which include CAD, CVA, CKD, retinopathy, and neuropathy. These are all discussed in detail with him.  - I have counseled him on diet  and weight management  by adopting a carbohydrate restricted/protein rich diet. Patient is encouraged to switch to  unprocessed or minimally processed     complex starch and increased protein intake (animal or plant source), fruits, and vegetables. -  he is advised to stick to a routine mealtimes to eat 3 meals  a day and avoid unnecessary snacks ( to snack only to correct hypoglycemia).   - he acknowledges that there is a room for improvement in his food and drink choices. - Suggestion is made for him to avoid simple carbohydrates  from his diet including Cakes, Sweet Desserts, Ice Cream, Soda (diet and regular), Sweet Tea, Candies, Chips, Cookies, Store Bought Juices, Alcohol in Excess of  1-2 drinks a day, Artificial Sweeteners,  Coffee Creamer, and "Sugar-free" Products, Lemonade. This will help patient to have more stable blood glucose profile and potentially avoid unintended weight gain.   - he has been scheduled with [name] [name], [name] [name], [name] [name] for diabetes education.  - I have approached him with the following individualized plan to manage  his diabetes and patient agrees:   -In light of his current and prevailing glycemic burden, he will continue to require multiple daily injections of insulin in order for him to control diabetes to target.  However, he is not eating 3 meals a day.  This lifestyle maximum is at least half of his prandial insulin injections.  He may do well with premixed insulin instead of Lantus/NovoLog.  I discussed and prescribed NovoLog 70/30 for him to use 40 units with breakfast and 40 units with supper when Premeal blood glucose readings are above 90 mg per DL.  He is advised to continue to monitor blood glucose 4 times a day-daily before meals and at bedtime.  - he is  warned not to take insulin without proper monitoring per orders.  - Adjustment parameters are given to him for hypo and hyperglycemia in writing. - he is encouraged to call clinic for blood glucose levels less than 70 or above 300 mg /dl. -He is advised to continue metformin  500 mg p.o. twice daily-daily after breakfast and after supper. -He is advised to continue glipizide 5 mg XL p.o. daily at breakfast.    - he is not a candidate for incretin therapy due to his current heavy smoking, at risk for pancreatitis.    - Specific targets for  A1c;  LDL, HDL,  and Triglycerides were discussed with the patient.  2) Blood Pressure /Hypertension:  -His blood pressure is controlled to target.  he is advised to continue his current medications including Imdur 30 mg p.o. daily with breakfast .  3) Lipids/Hyperlipidemia:   Review of his recent lipid panel showed un controlled  LDL at 126 .  he  is advised to continue atorvastatin 80  mg p.o. nightly.   Side effects and precautions discussed with him.  Plant dominant, [employer] lifestyle nutrition is discussed and recommended to him.  4)  Weight/Diet:  Body mass index is 24.88 kg/m.  -  he is not a candidate for major weight loss. I discussed with him the fact that loss of 5 - 10% of his  current body weight will have the most impact on his diabetes management.  Exercise, and detailed carbohydrates information provided  -  detailed on discharge instructions.  5) Chronic Care/Health Maintenance:  -he  is on Statin medications and  is encouraged to initiate and continue to follow up with Ophthalmology, Dentist,  Podiatrist at least yearly or according to recommendations, and advised to  quit smoking. I have recommended yearly flu vaccine and pneumonia vaccine at least every 5 years; moderate intensity exercise for up to 150 minutes weekly; and  sleep for at least 7 hours a day.   The patient was counseled on the dangers of tobacco use, and was advised  to quit.  Reviewed strategies to maximize success, including removing cigarettes and smoking materials from environment.  He had normal ABI on May 08, 2020.  This study will be repeated in December 2026, or sooner if needed.  - he is  advised to maintain close follow up with Soyla Dryer, PA-C for primary care needs, as well as his other providers for optimal and coordinated care.    I spent 35 minutes in the care of the patient today including review of labs from [facility], Lipids, Thyroid Function, Hematology (current and previous including abstractions from other facilities); face-to-face time discussing  his blood glucose readings/logs, discussing hypoglycemia and hyperglycemia episodes and symptoms, medications doses, his options of short and long term treatment based on the latest standards of care / guidelines;  discussion about incorporating lifestyle medicine;  and documenting the encounter.    Please refer to Patient Instructions for Blood Glucose Monitoring and Insulin/Medications Dosing Guide"  in media tab for additional information. Please  also refer to " Patient Self Inventory" in the Media  tab for reviewed elements of pertinent patient history.  [name] [name] participated in the discussions, expressed understanding, and voiced agreement with the above plans.  All questions were answered to his satisfaction. he is encouraged to contact clinic should he have any questions or concerns prior to his return visit.   Follow up plan: Return in 7 days for reevaluation. Glade Lloyd, MD [facility] [address] [place], [place] [address] Phone: [phone]  Fax: [phone]    01/14/2021, 12:39 PM  This note was partially dictated with voice recognition software. Similar sounding words can be transcribed inadequately or may not  be corrected upon review.

## 2021-01-14 NOTE — Patient Instructions (Signed)

## 2021-01-16 ENCOUNTER — Telehealth: Payer: Self-pay | Admitting: Cardiology

## 2021-01-16 MED ORDER — ISOSORBIDE MONONITRATE ER 30 MG PO TB24: 30.0000 mg | ORAL_TABLET | Freq: Every day | ORAL | 3 refills | Status: DC

## 2021-01-16 NOTE — Telephone Encounter (Signed)
New message    Patient returning call to nurse , he was outside and missed the call

## 2021-01-16 NOTE — Telephone Encounter (Signed)
-----   Message from Dyann Kief, PA-C sent at 01/16/2021  7:19 AM EDT ----- Please add Imdur 30 mg once daily-he used to be on this. Ask him if he's cut back on smoking and caffeine? Thanks ----- Message ----- From: Kerney Elbe, LPN Sent: 8/67/6720  10:17 AM EDT To: [name], PA-C, Dyann Kief, PA-C  Pt notified and states that he is still having chest tightness and SOB

## 2021-01-16 NOTE — Telephone Encounter (Signed)
Pt verbalized that he has cut back on both caffeine and smoking. Pt verbalized understanding of result note and had no further questions or concerns at this time.

## 2021-01-21 ENCOUNTER — Telehealth: Payer: Self-pay | Admitting: Physician Assistant

## 2021-01-21 NOTE — Telephone Encounter (Signed)
New message    Pt c/o medication issue:  1. Name of Medication isosorbide mononitrate   2. How are you currently taking this medication (dosage and times per day)? 1 tablet   3. Are you having a reaction (difficulty breathing--STAT)? Its to expensive   4. What is your medication issue? 114 dollars for 30 days can not afford this

## 2021-01-21 NOTE — Telephone Encounter (Signed)
Returned pt's call. No answer, left msg to call back.

## 2021-01-22 ENCOUNTER — Other Ambulatory Visit: Payer: Self-pay | Admitting: Physician Assistant

## 2021-01-22 DIAGNOSIS — E1165 Type 2 diabetes mellitus with hyperglycemia: Secondary | ICD-10-CM

## 2021-01-22 DIAGNOSIS — E785 Hyperlipidemia, unspecified: Secondary | ICD-10-CM

## 2021-01-22 DIAGNOSIS — I2510 Atherosclerotic heart disease of native coronary artery without angina pectoris: Secondary | ICD-10-CM

## 2021-01-23 ENCOUNTER — Encounter: Payer: Self-pay | Admitting: Urology

## 2021-01-23 ENCOUNTER — Other Ambulatory Visit: Payer: Self-pay

## 2021-01-23 ENCOUNTER — Ambulatory Visit (INDEPENDENT_AMBULATORY_CARE_PROVIDER_SITE_OTHER): Payer: Self-pay | Admitting: Urology

## 2021-01-23 VITALS — BP 92/60 | HR 114

## 2021-01-23 DIAGNOSIS — N492 Inflammatory disorders of scrotum: Secondary | ICD-10-CM

## 2021-01-23 LAB — URINALYSIS, ROUTINE W REFLEX MICROSCOPIC
Bilirubin, UA: NEGATIVE
Ketones, UA: NEGATIVE
Leukocytes,UA: NEGATIVE
Nitrite, UA: NEGATIVE
Specific Gravity, UA: >1.03 — ABNORMAL HIGH (ref 1.005–1.030)
Urobilinogen, Ur: 0.2 mg/dL (ref 0.2–1.0)
pH, UA: 5.5 (ref 5.0–7.5)

## 2021-01-23 NOTE — Progress Notes (Signed)
01/23/2021 9:19 AM   [name] [name] 1970/06/21 [id number]  Referring provider: Soyla Dryer, PA-C [place],  [place] [address]  Followup scrotal sebaceous cysts   HPI: [name] [name] is a 50yo here for followup for scrotal sebaceous cysts s/p excision. Incisions healed. No drainage from incisions. NO pain from incisions. He has left inguinal pain and a known left inguinal hernia. No other complaints today   PMH: Past Medical History:  Diagnosis Date   Anxiety    Arthritis    Asthma    CAD (coronary artery disease)    Moderate LAD disease 2016 - Dr. 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   Type 2 diabetes mellitus ([place])     Surgical History: Past Surgical History:  Procedure Laterality Date   ANKLE SURGERY Right 1982   "had extra bones in there; took them out"   [place]  12/26/2018   Procedure: BIOPSY;  Surgeon: Rogene Houston, MD;  [facility];  Service: Endoscopy;;  duodenal biopsies   BIOPSY  03/23/2019   Procedure: BIOPSY;  Surgeon: Rogene Houston, MD;  [facility];  Service: Endoscopy;;  esophagus   CARDIAC CATHETERIZATION N/A 03/28/2015   Procedure: Left Heart Cath and Coronary Angiography;  Surgeon: Adrian Prows, MD;  Location: [facility];  Service: Cardiovascular;  Laterality: N/A;   CARDIAC CATHETERIZATION N/A 03/28/2015   Procedure: Intravascular Pressure Wire/FFR Study;  Surgeon: Adrian Prows, MD;  Location: [facility];  Service: Cardiovascular;  Laterality: N/A;   CARPAL TUNNEL RELEASE Left ~ 2008   COLONOSCOPY WITH ESOPHAGOGASTRODUODENOSCOPY (EGD)      COLONOSCOPY WITH PROPOFOL N/A 12/24/2019   Procedure: COLONOSCOPY WITH PROPOFOL;  Surgeon: Rogene Houston, MD;  [facility];  Service: Endoscopy;  Laterality: N/A;  955   ELBOW FRACTURE SURGERY Left ~ 2008   ESOPHAGOGASTRODUODENOSCOPY N/A 12/26/2018   Procedure: ESOPHAGOGASTRODUODENOSCOPY (EGD);  Surgeon: Rogene Houston, MD;  [facility];  Service: Endoscopy;  Laterality: N/A;   ESOPHAGOGASTRODUODENOSCOPY (EGD) WITH PROPOFOL N/A 03/23/2019   Procedure: ESOPHAGOGASTRODUODENOSCOPY (EGD) WITH PROPOFOL;  Surgeon: Rogene Houston, MD;  [facility];  Service: Endoscopy;  Laterality: N/A;  7:30   FRACTURE SURGERY     ankle and elbow   PILONIDAL CYST EXCISION N/A 03/24/2017   Procedure: EXCISION CHRONIC  PILONIDAL ABSCESS;  Surgeon: Coralie Keens, MD;  Location: [facility];  Service: General;  Laterality: N/A;   SCROTAL EXPLORATION N/A 11/13/2020   Procedure: EXCISION OF SEBACEOUS CYSTS, SCROTUM;  Surgeon: Cleon Gustin, MD;  Location: [facility];  Service: Urology;  Laterality: N/A;   TENDON REPAIR Left ~ 2004   "main tendon in my ankle"    Home Medications:  Allergies as of 01/23/2021       Reactions   Omeprazole Magnesium Swelling   Face swells, no breathing impairment   Bee Venom    Esomeprazole Swelling   Face swells, no breathing impairment   Morphine And Related  Itching at IV site, dry heaving, burning sensation in throat.        Medication List        Accurate as of January 23, 2021  9:19 AM. If you have any questions, ask your nurse or doctor.          Accu-Chek Guide Me w/Device Kit 1 Piece by Does not apply route as directed.   Accu-Chek Guide test strip Generic drug: glucose blood Use to test glucose 4 times a day   ADVAIR DISKUS IN Inhale 1 puff into the lungs daily. Rinse mouth after use   albuterol 108 (90 Base) MCG/ACT inhaler Commonly known as: Proventil HFA INHALE 2 PUFFS BY MOUTH EVERY 6 HOURS AS NEEDED FOR  COUGHING, WHEEZING, OR SHORTNESS OF BREATH   ARIPiprazole 5 MG tablet Commonly known as: ABILIFY Take 5 mg by mouth daily.   ARIPiprazole 10 MG tablet Commonly known as: ABILIFY TAKE 1 Tablet BY MOUTH ONCE EVERY NIGHT AT BEDTIME DOSE CHANGE   Aspirin Low Dose 81 MG EC tablet Generic drug: aspirin Take 81 mg by mouth every morning.   atorvastatin 80 MG tablet Commonly known as: LIPITOR TAKE 1 Tablet BY MOUTH ONCE EVERY DAY   bacitracin 500 UNIT/GM ointment Apply 1 application topically 2 (two) times daily.   bacitracin ointment APPLY TO AFFECTED AREA TWICE A DAY   famotidine 40 MG tablet Commonly known as: PEPCID Take 1 tablet (40 mg total) by mouth 2 (two) times daily.   gabapentin 100 MG capsule Commonly known as: NEURONTIN Take 1 capsule (100 mg total) by mouth 3 (three) times daily.   glipiZIDE 5 MG 24 hr tablet Commonly known as: GLUCOTROL XL Take 1 tablet (5 mg total) by mouth daily with breakfast.   isosorbide mononitrate 30 MG 24 hr tablet Commonly known as: IMDUR Take 1 tablet (30 mg total) by mouth daily.   metFORMIN 500 MG tablet Commonly known as: GLUCOPHAGE Take 1 tablet (500 mg total) by mouth 2 (two) times daily with a meal.   metoCLOPramide 5 MG tablet Commonly known as: Reglan Take 1 tablet (5 mg total) by mouth 4 (four) times daily -  before meals and at bedtime.   metoprolol tartrate 25 MG tablet Commonly known as: LOPRESSOR Take 1 tablet (25 mg total) by mouth 2 (two) times daily.   nitroGLYCERIN 0.4 MG SL tablet Commonly known as: NITROSTAT Place 1 tablet (0.4 mg total) under the tongue every 5 (five) minutes as needed for chest pain.   NovoLOG Mix 70/30 FlexPen (70-30) 100 UNIT/ML FlexPen Generic drug: insulin aspart protamine - aspart Inject 40 Units into the skin 2 (two) times daily with a meal. Only with breakfast and supper when premeal glucose is above 90   oxyCODONE-acetaminophen 5-325 MG tablet Commonly known as: Percocet Take  1 tablet by mouth every 4 (four) hours as needed for severe pain.   polyethylene glycol 17 g packet Commonly known as: MIRALAX / GLYCOLAX Take 17 g by mouth daily.   promethazine 25 MG suppository Commonly known as: PHENERGAN Place 1 suppository (25 mg total) rectally every 8 (eight) hours as needed for refractory nausea / vomiting.   sertraline 100 MG tablet Commonly known as: ZOLOFT Take 1 tablet by mouth daily.   traZODone 50 MG tablet Commonly known as: DESYREL Take 50 mg by mouth at bedtime.        Allergies:  Allergies  Allergen Reactions   Omeprazole Magnesium Swelling    Face swells, no breathing  impairment   Bee Venom    Esomeprazole Swelling    Face swells, no breathing impairment   Morphine And Related     Itching at IV site, dry heaving, burning sensation in throat.    Family History: Family History  Problem Relation Age of Onset   Diabetes Mother    Hypertension Mother    Cancer Mother    Throat cancer Mother    Diabetes Father    Hypertension Father    Hyperlipidemia Father    Congestive Heart Failure Sister    Colon cancer Neg Hx    Pancreatic cancer Neg Hx    Liver disease Neg Hx     Social History:  reports that he has been smoking cigarettes. He has a 17.00 pack-year smoking history. He has never used smokeless tobacco. He reports that he does not drink alcohol and does not use drugs.  ROS: All other review of systems were reviewed and are negative except what is noted above in HPI  Physical Exam: BP 92/60   Pulse (!) 114   Constitutional:  Alert and oriented, No acute distress. HEENT: [place] AT, moist mucus membranes.  Trachea midline, no masses. Cardiovascular: No clubbing, cyanosis, or edema. Respiratory: Normal respiratory effort, no increased work of breathing. GI: Abdomen is soft, nontender, nondistended, no abdominal masses GU: No CVA tenderness.  Lymph: No cervical or inguinal lymphadenopathy. Skin: No rashes, bruises or  suspicious lesions. Neurologic: Grossly intact, no focal deficits, moving all 4 extremities. Psychiatric: Normal mood and affect.  Laboratory Data: Lab Results  Component Value Date   WBC 10.2 01/09/2021   HGB 14.6 01/09/2021   HCT 44.4 01/09/2021   MCV 94.9 01/09/2021   PLT 286 01/09/2021    Lab Results  Component Value Date   CREATININE 1.50 (H) 01/09/2021    No results found for: PSA  No results found for: TESTOSTERONE  Lab Results  Component Value Date   HGBA1C 13.6 (H) 11/11/2020    Urinalysis    Component Value Date/Time   COLORURINE STRAW (A) 11/11/2020 0245   APPEARANCEUR Clear 12/10/2020 0834   LABSPEC 1.015 11/11/2020 0245   PHURINE 6.0 11/11/2020 0245   GLUCOSEU 3+ (A) 12/10/2020 0834   HGBUR MODERATE (A) 11/11/2020 0245   BILIRUBINUR Negative 12/10/2020 [place] 11/11/2020 0245   PROTEINUR 3+ (A) 12/10/2020 0834   PROTEINUR 100 (A) 11/11/2020 0245   UROBILINOGEN negative (A) 09/03/2020 1148   NITRITE Negative 12/10/2020 0834   NITRITE NEGATIVE 11/11/2020 0245   LEUKOCYTESUR Negative 12/10/2020 0834   LEUKOCYTESUR NEGATIVE 11/11/2020 0245    Lab Results  Component Value Date   LABMICR See below: 12/10/2020   WBCUA None seen 12/10/2020   LABEPIT None seen 12/10/2020   MUCUS Present 12/10/2020   BACTERIA None seen 12/10/2020    Pertinent Imaging:  No results found for this or any previous visit.  No results found for this or any previous visit.  No results found for this or any previous visit.  No results found for this or any previous visit.  No results found for this or any previous visit.  No results found for this or any previous visit.  No results found for this or any previous visit.  No results found for this or any previous visit.   Assessment & Plan:    1. Scrotal abscess Incision well healed. RTC prn - Urinalysis, Routine w reflex microscopic   No follow-ups on file.  Nicolette Bang, MD  [facility]

## 2021-01-23 NOTE — Progress Notes (Signed)
Urological Symptom Review  Patient is experiencing the following symptoms: Frequent urination Get up at night to urinate Stream starts and stops Weak stream Erection problems (male only)   Review of Systems  Gastrointestinal (upper)  : Nausea Vomiting Indigestion/heartburn  Gastrointestinal (lower) : Constipation  Constitutional : Negative for symptoms  Skin: Negative for skin symptoms  Eyes: Blurred vision  Ear/Nose/Throat : Negative for Ear/Nose/Throat symptoms  Hematologic/Lymphatic: Negative for Hematologic/Lymphatic symptoms  Cardiovascular : Leg swelling Chest pain  Respiratory : Cough Shortness of breath  Endocrine: Excessive thirst  Musculoskeletal: Back pain Joint pain  Neurological: Headaches Dizziness  Psychologic: Depression Anxiety

## 2021-01-23 NOTE — Patient Instructions (Signed)
Skin Abscess  A skin abscess is an infected area on or under your skin that contains a collection of pus and other material. An abscess may also be called a furuncle,carbuncle, or boil. An abscess can occur in or on almost any part of your body. Some abscesses break open (rupture) on their own. Most continue to get worse unless they are treated. The infection can spread deeper into the body and eventually into your blood, whichcan make you feel ill. Treatment usually involves draining the abscess. What are the causes? An abscess occurs when germs, like bacteria, pass through your skin and cause an infection. This may be caused by: A scrape or cut on your skin. A puncture wound through your skin, including a needle injection or insect bite. Blocked oil or sweat glands. Blocked and infected hair follicles. A cyst that forms beneath your skin (sebaceous cyst) and becomes infected. What increases the risk? This condition is more likely to develop in people who: Have a weak body defense system (immune system). Have diabetes. Have dry and irritated skin. Get frequent injections or use illegal IV drugs. Have a foreign body in a wound, such as a splinter. Have problems with their lymph system or veins. What are the signs or symptoms? Symptoms of this condition include: A painful, firm bump under the skin. A bump with pus at the top. This may break through the skin and drain. Other symptoms include: Redness surrounding the abscess site. Warmth. Swelling of the lymph nodes (glands) near the abscess. Tenderness. A sore on the skin. How is this diagnosed? This condition may be diagnosed based on: A physical exam. Your medical history. A sample of pus. This may be used to find out what is causing the infection. Blood tests. Imaging tests, such as an ultrasound, CT scan, or MRI. How is this treated? A small abscess that drains on its own may not need treatment. Treatment for larger abscesses  may include: Moist heat or heat pack applied to the area several times a day. A procedure to drain the abscess (incision and drainage). Antibiotic medicines. For a severe abscess, you may first get antibiotics through an IV and then change to antibiotics by mouth. Follow these instructions at home: Medicines  Take over-the-counter and prescription medicines only as told by your health care provider. If you were prescribed an antibiotic medicine, take it as told by your health care provider. Do not stop taking the antibiotic even if you start to feel better.  Abscess care  If you have an abscess that has not drained, apply heat to the affected area. Use the heat source that your health care provider recommends, such as a moist heat pack or a heating pad. Place a towel between your skin and the heat source. Leave the heat on for 20-30 minutes. Remove the heat if your skin turns bright red. This is especially important if you are unable to feel pain, heat, or cold. You may have a greater risk of getting burned. Follow instructions from your health care provider about how to take care of your abscess. Make sure you: Cover the abscess with a bandage (dressing). Change your dressing or gauze as told by your health care provider. Wash your hands with soap and water before you change the dressing or gauze. If soap and water are not available, use hand sanitizer. Check your abscess every day for signs of a worsening infection. Check for: More redness, swelling, or pain. More fluid or blood. Warmth. More   pus or a bad smell.  General instructions To avoid spreading the infection: Do not share personal care items, towels, or hot tubs with others. Avoid making skin contact with other people. Keep all follow-up visits as told by your health care provider. This is important. Contact a health care provider if you have: More redness, swelling, or pain around your abscess. More fluid or blood coming  from your abscess. Warm skin around your abscess. More pus or a bad smell coming from your abscess. A fever. Muscle aches. Chills or a general ill feeling. Get help right away if you: Have severe pain. See red streaks on your skin spreading away from the abscess. Summary A skin abscess is an infected area on or under your skin that contains a collection of pus and other material. A small abscess that drains on its own may not need treatment. Treatment for larger abscesses may include having a procedure to drain the abscess and taking an antibiotic. This information is not intended to replace advice given to you by your health care provider. Make sure you discuss any questions you have with your healthcare provider. Document Revised: 09/07/2018 Document Reviewed: 06/30/2017 Elsevier Patient Education  2022 [employer]  

## 2021-01-26 NOTE — Telephone Encounter (Signed)
Patient states he cannot remember what med it was. I called [facility] and they said it was an rx for famotidine that was $114. We did not prescribe this so I advised him to speak with his GI doctor. He agrees he will contact them.

## 2021-01-27 ENCOUNTER — Encounter: Payer: Self-pay | Admitting: Physician Assistant

## 2021-02-03 ENCOUNTER — Encounter: Payer: Self-pay | Admitting: Physician Assistant

## 2021-02-03 ENCOUNTER — Other Ambulatory Visit (HOSPITAL_COMMUNITY)
Admission: RE | Admit: 2021-02-03 | Discharge: 2021-02-03 | Disposition: A | Discharge status: Home / Self Care | Payer: Self-pay | Source: Ambulatory Visit | Attending: Physician Assistant | Admitting: Physician Assistant

## 2021-02-03 ENCOUNTER — Telehealth: Payer: Self-pay

## 2021-02-03 ENCOUNTER — Ambulatory Visit: Payer: Self-pay | Admitting: Physician Assistant

## 2021-02-03 ENCOUNTER — Other Ambulatory Visit: Payer: Self-pay

## 2021-02-03 VITALS — BP 120/80 | HR 114 | Temp 97.3°F | Wt 159.0 lb

## 2021-02-03 DIAGNOSIS — E1165 Type 2 diabetes mellitus with hyperglycemia: Secondary | ICD-10-CM

## 2021-02-03 DIAGNOSIS — E785 Hyperlipidemia, unspecified: Secondary | ICD-10-CM

## 2021-02-03 DIAGNOSIS — I2510 Atherosclerotic heart disease of native coronary artery without angina pectoris: Secondary | ICD-10-CM

## 2021-02-03 DIAGNOSIS — K2270 Barrett's esophagus without dysplasia: Secondary | ICD-10-CM

## 2021-02-03 DIAGNOSIS — F17200 Nicotine dependence, unspecified, uncomplicated: Secondary | ICD-10-CM

## 2021-02-03 LAB — LIPID PANEL
Cholesterol: 118 mg/dL (ref 0–200)
HDL: 26 mg/dL — ABNORMAL LOW (ref >40)
LDL Cholesterol: 49 mg/dL (ref 0–99)
Total CHOL/HDL Ratio: 4.5 RATIO
Triglycerides: 213 mg/dL — ABNORMAL HIGH (ref <150)
VLDL: 43 mg/dL — ABNORMAL HIGH (ref 0–40)

## 2021-02-03 LAB — HEPATIC FUNCTION PANEL
ALT: 11 U/L (ref 0–44)
AST: 12 U/L — ABNORMAL LOW (ref 15–41)
Albumin: 3.4 g/dL — ABNORMAL LOW (ref 3.5–5.0)
Alkaline Phosphatase: 95 U/L (ref 38–126)
Bilirubin, Direct: 0.1 mg/dL (ref 0.0–0.2)
Indirect Bilirubin: 0.5 mg/dL (ref 0.3–0.9)
Total Bilirubin: 0.6 mg/dL (ref 0.3–1.2)
Total Protein: 6.6 g/dL (ref 6.5–8.1)

## 2021-02-03 NOTE — Progress Notes (Addendum)
Cardiology Office Note    Date:  02/16/2021   ID:  [name] PettyDanny L [name], DOB Sep 06, 1970, MRN [id number]   PCP:  Jacquelin HawkingMcElroy, Shannon, Cordelia PochePA-C   [facility]  Cardiologist:  [name] DellSamuel McDowell, MD   Advanced Practice Provider:  No care team member to display Electrophysiologist:  None   [phone]}   Chief Complaint  Patient presents with   Follow-up    History of Present Illness:  [name] [name] is a 50 y.o. male with history of CAD with nonobstructive disease on cardiac cath 2016 normal LVEF 55 to 60% Lexiscan 12/2017 no ischemia, hypertension, HLD, DM poorly controlled, family history of premature CAD, tobacco abuse    Hospitalized 08/2020 with scrotal wall abscess and sepsis with AKI.  I saw the patient in follow-up 12/29/2020.  He had not had cardiac meds in a long time and was complaining of sharp shooting chest pain that would go away when he rubbed his chest.  He also had chest tightness with walking and dyspnea on exertion.  He was drinking 2 L of [facility] and 2 cups of tea daily as well as smoking a pack of cigarettes daily.  Was living with his parents because of physical and mental disabilities.  I restarted metoprolol and Lexiscan Myoview 01/07/2021 small mild intensity apical inferior defect partially reversible consistent with mild ischemic territory, low risk study LVEF 57%.  Patient was still having chest pain and Imdur started.   He was scheduled for regular follow-up but also needs preoperative clearance endoscopy scheduled 02/25/2021 by Dr. Doristine LocksVito Cirigliano.  Patient says he gets dizzy when he stands up since on Imdur. Says he passed out getting up out of a recliner quickly and walking to the kitchen. Has lost 10 lbs since here last. Says he's eating and drinking ok. Imdur didn't make any difference in his chest pain. Stopped drinking soda. Smoking 1 ppd. Stressed because his mother died of covid over Labor day. Has chronic DOE and chest tightness with little  activity.     Past Medical History:  Diagnosis Date   Anxiety    Arthritis    Asthma    CAD (coronary artery disease)    Moderate LAD disease 2016 - Dr. 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   Type 2 diabetes mellitus (HCC)     Past Surgical History:  Procedure Laterality Date   ANKLE SURGERY Right 1982   "had extra bones in there; took them out"   APPENDECTOMY  1975   BIOPSY  12/26/2018   Procedure: BIOPSY;  Surgeon: Malissa Hippoehman, Najeeb U, MD;  Location: [facility];  Service: Endoscopy;;  duodenal biopsies   BIOPSY  03/23/2019   Procedure: BIOPSY;  Surgeon: Malissa Hippoehman, Najeeb U, MD;  Location: [facility];  Service: Endoscopy;;  esophagus   CARDIAC CATHETERIZATION N/A 03/28/2015   Procedure: Left Heart Cath and Coronary Angiography;  Surgeon: [name] DecampJay Ganji, MD;  [facility];  Service: Cardiovascular;  Laterality: N/A;   CARDIAC CATHETERIZATION N/A 03/28/2015   Procedure: Intravascular Pressure  Wire/FFR Study;  Surgeon: [name] Decamp, MD;  Location: [facility] INVASIVE CV LAB;  Service: Cardiovascular;  Laterality: N/A;   CARPAL TUNNEL RELEASE Left ~ 2008   COLONOSCOPY WITH ESOPHAGOGASTRODUODENOSCOPY (EGD)     COLONOSCOPY WITH PROPOFOL N/A 12/24/2019   Procedure: COLONOSCOPY WITH PROPOFOL;  Surgeon: Malissa Hippo, MD;  [facility]: [facility];  Service: Endoscopy;  Laterality: N/A;  955   ELBOW FRACTURE SURGERY Left ~ 2008   ESOPHAGOGASTRODUODENOSCOPY N/A 12/26/2018   Procedure: ESOPHAGOGASTRODUODENOSCOPY (EGD);  Surgeon: Malissa Hippo, MD;  [facility];  Service: Endoscopy;  Laterality: N/A;   ESOPHAGOGASTRODUODENOSCOPY (EGD)  WITH PROPOFOL N/A 03/23/2019   Procedure: ESOPHAGOGASTRODUODENOSCOPY (EGD) WITH PROPOFOL;  Surgeon: Malissa Hippo, MD;  [facility]: [facility];  Service: Endoscopy;  Laterality: N/A;  7:30   FRACTURE SURGERY     ankle and elbow   PILONIDAL CYST EXCISION N/A 03/24/2017   Procedure: EXCISION CHRONIC  PILONIDAL ABSCESS;  Surgeon: [name] Miyamoto, MD;  Location: [facility];  Service: General;  Laterality: N/A;   SCROTAL EXPLORATION N/A 11/13/2020   Procedure: EXCISION OF SEBACEOUS CYSTS, SCROTUM;  Surgeon: [name] Gauze, MD;  Location: [facility];  Service: Urology;  Laterality: N/A;   TENDON REPAIR Left ~ 2004   "main tendon in my ankle"    Current Medications: Current Meds  Medication Sig   albuterol (PROVENTIL HFA) 108 (90 Base) MCG/ACT inhaler INHALE 2 PUFFS BY MOUTH EVERY 6 HOURS AS NEEDED FOR COUGHING, WHEEZING, OR SHORTNESS OF BREATH   ARIPiprazole (ABILIFY) 10 MG tablet TAKE 1 Tablet BY MOUTH ONCE EVERY NIGHT AT BEDTIME DOSE CHANGE   ASPIRIN LOW DOSE 81 MG EC tablet Take 81 mg by mouth every morning.   atorvastatin (LIPITOR) 80 MG tablet TAKE 1 Tablet BY MOUTH ONCE EVERY DAY   famotidine (PEPCID) 40 MG tablet Take 1 tablet (40 mg total) by mouth 2 (two) times daily.   Fluticasone-Salmeterol (ADVAIR DISKUS IN) Inhale 1 puff into the lungs daily. Rinse mouth after use   gabapentin (NEURONTIN) 100 MG capsule Take 1 capsule (100 mg total) by mouth 3 (three) times daily.   glipiZIDE (GLUCOTROL XL) 5 MG 24 hr tablet Take 1 tablet (5 mg total) by mouth daily with breakfast.   insulin glargine (LANTUS) 100 UNIT/ML injection Inject 60 Units into the skin daily.   metFORMIN (GLUCOPHAGE) 500 MG tablet TAKE 1 Tablet  BY MOUTH TWICE DAILY WITH A MEAL   metoCLOPramide (REGLAN) 5 MG tablet Take 1 tablet (5 mg total) by mouth 4 (four) times daily -  before meals and at bedtime.   metoprolol tartrate (LOPRESSOR) 25 MG tablet Take 0.5 tablets (12.5 mg total) by mouth 2 (two) times daily.    nitroGLYCERIN (NITROSTAT) 0.4 MG SL tablet Place 1 tablet (0.4 mg total) under the tongue every 5 (five) minutes as needed for chest pain.   polyethylene glycol (MIRALAX / GLYCOLAX) 17 g packet Take 17 g by mouth daily.   promethazine (PHENERGAN) 25 MG suppository Place 1 suppository (25 mg total) rectally every 8 (eight) hours as needed for refractory nausea / vomiting.   sertraline (ZOLOFT) 100 MG tablet Take 1 tablet by mouth daily.   traZODone (DESYREL) 50 MG tablet Take 50 mg by mouth at bedtime.   [DISCONTINUED] isosorbide mononitrate (IMDUR) 30 MG 24 hr tablet Take 1 tablet (30 mg total) by mouth daily.   [DISCONTINUED] metoprolol tartrate (LOPRESSOR) 25 MG tablet Take 1 tablet (25 mg total) by mouth 2 (two) times daily.  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   Social Determinants of Health   Financial Resource Strain: Not on file  Food Insecurity: Not on file  Transportation Needs: Not on file  Physical Activity: Not on file  Stress: Not on file  Social Connections: Not on file     Family History:  The patient's  family history includes Cancer in his mother; Congestive Heart Failure in his  sister; Diabetes in his father and mother; Hyperlipidemia in his father; Hypertension in his father and mother; Throat cancer in his mother.   ROS:   Please see the history of present illness.    ROS All other systems reviewed and are negative.   PHYSICAL EXAM:   VS:  Ht 5\' 8"  (1.727 m)   Wt 153 lb (69.4 kg)   SpO2 97%   BMI 23.26 kg/m   Physical Exam  GEN: Thin, in no acute distress  Neck: no JVD, carotid bruits, or masses Cardiac:RRR; no murmurs, rubs, or gallops  Respiratory: scattered wheezing throughout GI: soft, nontender, nondistended, + BS Ext: without cyanosis, clubbing, or edema, Good distal pulses bilaterally Neuro:  Alert and Oriented x 3 Psych: euthymic mood, full affect  Wt Readings from Last 3 Encounters:  02/16/21 153 lb (69.4 kg)  02/03/21 159 lb (72.1 kg)  01/14/21 163 lb 9.6 oz (74.2 kg)      Studies/Labs Reviewed:   EKG:  EKG is not ordered today.     Recent Labs: 01/09/2021: BUN 27; Creatinine, Ser 1.50; Hemoglobin 14.6; Platelets 286; Potassium 5.0; Sodium 134 02/03/2021: ALT 11   Lipid Panel    Component Value Date/Time   CHOL 118 02/03/2021 1022   TRIG 213 (H) 02/03/2021 1022   HDL 26 (L) 02/03/2021 1022   CHOLHDL 4.5 02/03/2021 1022   VLDL 43 (H) 02/03/2021 1022   LDLCALC 49 02/03/2021 1022   LDLDIRECT 181.3 (H) 11/03/2020 1139    Additional studies/ records that were reviewed today include:  NST 01/13/21 No diagnostic ST segment changes to indicate ischemia. Small, mild intensity, apical inferior defect that is partially reversible and consistent with a mild ischemic territory. This is a low risk study. Nuclear stress EF: 57%.   Lexiscan 2019 There was no ST segment deviation noted during stress. Defect 1: There is a medium defect of mild severity present in the mid anteroseptal, mid inferoseptal, mid inferior, apical septal and apical inferior location. This is likely due to soft tissue attenuation. This is a low risk study. No  ischemic zones. Nuclear stress EF: 63%.  Cardiac catheterization 03/28/2015: 1. Normal LV systolic function, EF 55-60%. 2. Moderate coronary artery disease, proximal to midsegment of the LAD showing a diffuse 50% stenosis, there is rapid tapering of the LAD from the proximal segment to the midsegment and followed by a hazy 50% stenosis. Mid to distal LAD also had a 50% stenosis. FFR to this region was negative for hemodynamic significance. There is minimal disease in the circumflex and RCA. Codominant system.     Risk Assessment/Calculations:         ASSESSMENT:    1. Preoperative clearance   2. Orthostatic hypotension   3. Medication management   4. CAD in native artery   5. Essential hypertension   6. Mixed hyperlipidemia   7. Tobacco abuse   8. Right bundle branch block      PLAN:  In order of problems listed above:  Preoperative clearance for endoscopy scheduled 02/25/2021 by Dr. Doristine Locks. Patient with chronic DOE and chest tightness with little activity-no improvement with Imdur/metoprolol. Lexiscan 12/2020 small area of reversible ischemia..  I reviewed the patient with Dr. Diona Browner and because of recent low risk Lexiscan and endoscopy being a low risk procedure he felt the patient could proceed with endoscopy without any further testing. According to the Revised Cardiac Risk Index (RCRI), his Perioperative Risk of Major Cardiac Event is (%): 0.9  His Functional Capacity in METs is: 4.06 according to the Duke Activity Status Index (DASI).   Orthostatic hypotension/syncope x1. Stop Imdur and reduce metoprolol to 12.5 mg bid. Increase hydration. Check CBC and CMET today.    CAD nonobstructive disease on cardiac cath 2016, Lexiscan 12/2017 no ischemia recurrent chest pain NST 01/13/2021 small defect apical inferior region partially reversible consistent with mild ischemic territory low risk study EF 57% Imdur added metoprolol restarted.  Dr. Diona Browner felt if the  patient's symptoms could not be controlled with medications he may ultimately need another cardiac catheterization down the road.  Hypertension-orhtostatic today  Hyperlipidemia LDL 49 on lipitor  Tobacco abuse smoking cessation discussed.  Uncontrolled diabetes with hyperglycemia A1c 13.6 on 11/11/2020 quit drinking soda   Sinus tachycardia with RBBB/LPFB  Shared Decision Making/Informed Consent        Medication Adjustments/Labs and Tests Ordered: Current medicines are reviewed at length with the patient today.  Concerns regarding medicines are outlined above.  Medication changes, Labs and Tests ordered today are listed in the Patient Instructions below. Patient Instructions  Medication Instructions:  STOP Imdur   DECREASE Lopressor to 12.5 mg twice a day    *If you need a refill on your cardiac medications before your next appointment, please call your pharmacy*   Lab Work: Cbc, bmet TODAY  If you have labs (blood work) drawn today and your tests are completely normal, you will receive your results only by: MyChart Message (if you have MyChart) OR A paper copy in the mail If you have any lab test that is abnormal or we need to change your treatment, we will call you to review the results.   Testing/Procedures: None today    Follow-Up: At [facility], you and your health needs are our priority.  As part of our continuing mission to provide you with exceptional heart care, we have created designated Provider Care Teams.  These Care Teams include your primary Cardiologist (physician) and Advanced Practice Providers (APPs -  Physician Assistants and Nurse Practitioners) who all work together to provide you with the care you need, when you need it.  We  recommend signing up for the patient portal called "MyChart".  Sign up information is provided on this After Visit Summary.  MyChart is used to connect with patients for Virtual Visits (Telemedicine).  Patients are able  to view lab/test results, encounter notes, upcoming appointments, etc.  Non-urgent messages can be sent to your provider as well.   To learn more about what you can do with MyChart, go to [email or web address].    Your next appointment:   6 month(s)  The format for your next appointment:   In Person  Provider:   Nona Dell, MD   Other Instructions None    Signed, Jacolyn Reedy, PA-C  02/16/2021 1:19 PM    [facility] [address] [place], [place], [place]  [address] Phone: [phone]; Fax: [phone]

## 2021-02-03 NOTE — Telephone Encounter (Signed)
Carollee Herter PA at [facility] called and said that he has an appt with you on 9/21 to follow up with his new insulin and meter/logs. She said that he has not started the insulin yet because he was filling out paperwork for patient assistance. Do you want him to hold off on the appt for now?

## 2021-02-03 NOTE — Telephone Encounter (Signed)
Patient wants to push appt out a few weeks due to his mother passing yesterday. He will call back and sch that.

## 2021-02-03 NOTE — Progress Notes (Signed)
BP 120/80   Pulse (!) 114   Temp (!) 97.3 F (36.3 C)   Wt 159 lb (72.1 kg)   SpO2 95%   BMI 24.18 kg/m    Subjective:    Patient ID: [name] [name], male    DOB: Jul 28, 1970, 50 y.o.   MRN: [id number]  HPI: [name] [name] is a 50 y.o. male presenting on 02/03/2021 for Hyperlipidemia   HPI   Pt had a negative covid 19 screening questionnaire.    Pt is 50yoM who presents for follow up dyslipidemia and to review his care.   Pt has uncontrolled DM which is followed by endocrinology.  He ws recently chnged from lantus and apidra to novolog 70/30.   Pt has been working with our office trying to get approval for pt assistance from [employer] for the novolog 70/30; this is a much more difficult task than getting approval from [employer] where he was getting the lantus and apidra.  He has appointment to follow up with endocrinology later this month.  He is continuing to use the lantus/apidra since he has not yet gotten the 70/30.    Pt's mother died yesterday.  Apparently she had lung damage from having covid several months ago.  Pt was living with and being supported by his mother.  Pt says he is doing okay with this at the moment, processing it.    Pt says his Scrotal abscess is gone  Pt has follow up appointment with Cardiology later this month.  He had a low risk myoview last month.    Pt is seeing GI for Barretts esophagus.  He was referred by Dr Levon Hedger to another specialist, Dr Doristine Locks.  Pt has follow up there later this month as well.       Relevant past medical, surgical, family and social history reviewed and updated as indicated. Interim medical history since our last visit reviewed. Allergies and medications reviewed and updated.    Current Outpatient Medications:    albuterol (PROVENTIL HFA) 108 (90 Base) MCG/ACT inhaler, INHALE 2 PUFFS BY MOUTH EVERY 6 HOURS AS NEEDED FOR COUGHING, WHEEZING, OR SHORTNESS OF BREATH, Disp: 20.1 g, Rfl: 0   ARIPiprazole  (ABILIFY) 10 MG tablet, TAKE 1 Tablet BY MOUTH ONCE EVERY NIGHT AT BEDTIME DOSE CHANGE, Disp: , Rfl:    ASPIRIN LOW DOSE 81 MG EC tablet, Take 81 mg by mouth every morning., Disp: , Rfl:    atorvastatin (LIPITOR) 80 MG tablet, TAKE 1 Tablet BY MOUTH ONCE EVERY DAY, Disp: 90 tablet, Rfl: 0   famotidine (PEPCID) 40 MG tablet, Take 1 tablet (40 mg total) by mouth 2 (two) times daily., Disp: 180 tablet, Rfl: 3   Fluticasone-Salmeterol (ADVAIR DISKUS IN), Inhale 1 puff into the lungs daily. Rinse mouth after use, Disp: , Rfl:    gabapentin (NEURONTIN) 100 MG capsule, Take 1 capsule (100 mg total) by mouth 3 (three) times daily., Disp: 90 capsule, Rfl: 0   insulin aspart protamine - aspart (NOVOLOG MIX 70/30 FLEXPEN) (70-30) 100 UNIT/ML FlexPen, Inject 40 Units into the skin 2 (two) times daily with a meal. Only with breakfast and supper when premeal glucose is above 90, Disp: 30 mL, Rfl: 2   insulin glargine (LANTUS) 100 UNIT/ML injection, Inject 60 Units into the skin daily., Disp: , Rfl:    insulin glulisine (APIDRA) 100 UNIT/ML injection, Inject 15 Units into the skin 3 (three) times daily before meals., Disp: , Rfl:    isosorbide mononitrate (IMDUR)  30 MG 24 hr tablet, Take 1 tablet (30 mg total) by mouth daily., Disp: 90 tablet, Rfl: 3   metFORMIN (GLUCOPHAGE) 500 MG tablet, Take 1 tablet (500 mg total) by mouth 2 (two) times daily with a meal., Disp: 60 tablet, Rfl: 2   metoprolol tartrate (LOPRESSOR) 25 MG tablet, Take 1 tablet (25 mg total) by mouth 2 (two) times daily., Disp: 60 tablet, Rfl: 6   nitroGLYCERIN (NITROSTAT) 0.4 MG SL tablet, Place 1 tablet (0.4 mg total) under the tongue every 5 (five) minutes as needed for chest pain., Disp: 25 tablet, Rfl: 3   polyethylene glycol (MIRALAX / GLYCOLAX) 17 g packet, Take 17 g by mouth daily., Disp: 14 each, Rfl: 0   promethazine (PHENERGAN) 25 MG suppository, Place 1 suppository (25 mg total) rectally every 8 (eight) hours as needed for refractory  nausea / vomiting., Disp: 12 each, Rfl: 0   sertraline (ZOLOFT) 100 MG tablet, Take 1 tablet by mouth daily., Disp: , Rfl:    traZODone (DESYREL) 50 MG tablet, Take 50 mg by mouth at bedtime., Disp: , Rfl:    ARIPiprazole (ABILIFY) 5 MG tablet, Take 5 mg by mouth daily. (Patient not taking: Reported on 02/03/2021), Disp: , Rfl:    glipiZIDE (GLUCOTROL XL) 5 MG 24 hr tablet, Take 1 tablet (5 mg total) by mouth daily with breakfast. (Patient not taking: Reported on 02/03/2021), Disp: 30 tablet, Rfl: 3   metoCLOPramide (REGLAN) 5 MG tablet, Take 1 tablet (5 mg total) by mouth 4 (four) times daily -  before meals and at bedtime. (Patient not taking: Reported on 02/03/2021), Disp: 120 tablet, Rfl: 1    Review of Systems  Per HPI unless specifically indicated above     Objective:    BP 120/80   Pulse (!) 114   Temp (!) 97.3 F (36.3 C)   Wt 159 lb (72.1 kg)   SpO2 95%   BMI 24.18 kg/m   Wt Readings from Last 3 Encounters:  02/03/21 159 lb (72.1 kg)  01/14/21 163 lb 9.6 oz (74.2 kg)  01/09/21 165 lb (74.8 kg)    Physical Exam Constitutional:      General: He is not in acute distress.    Appearance: He is normal weight. He is not toxic-appearing.  HENT:     Head: Normocephalic and atraumatic.  Cardiovascular:     Rate and Rhythm: Normal rate and regular rhythm.  Pulmonary:     Effort: Pulmonary effort is normal. No respiratory distress.  Abdominal:     General: Bowel sounds are normal.     Palpations: Abdomen is soft.     Tenderness: There is no abdominal tenderness.  Musculoskeletal:     Right lower leg: No edema.     Left lower leg: No edema.  Neurological:     Mental Status: He is alert and oriented to person, place, and time.  Psychiatric:        Behavior: Behavior normal.          Assessment & Plan:     Encounter Diagnoses  Name Primary?   Hyperlipidemia, unspecified hyperlipidemia type Yes   Uncontrolled type 2 diabetes mellitus with hyperglycemia (HCC)     Coronary artery disease involving native coronary artery of native heart without angina pectoris    Barrett's esophagus without dysplasia    Tobacco use disorder      -pt to get labs drawn (to check lipids).  He will be called with results -pt to continue working  on getting necessary paperwork so his 70/30 insulin can be ordered for him.   -no medication changes today -pt to continue with cardiology, endocrinology and GI per their recommendations -pt to follow up 6 weeks to make sure things are moving along.  Pt to contact office sooner prn

## 2021-02-12 ENCOUNTER — Other Ambulatory Visit: Payer: Self-pay | Admitting: "Endocrinology

## 2021-02-12 ENCOUNTER — Telehealth: Payer: Self-pay | Admitting: Physician Assistant

## 2021-02-12 ENCOUNTER — Telehealth: Payer: Self-pay

## 2021-02-12 NOTE — Telephone Encounter (Signed)
Pt voiced understanding and will seek a ED evaluation.

## 2021-02-12 NOTE — Telephone Encounter (Signed)
Call from pt regarding side effects from medication given by cardiology, informed to contact cardiologist regarding side effects or possible adjustment of medication.

## 2021-02-12 NOTE — Telephone Encounter (Signed)
Covering Michele's inbox. (I am out on PAL today but logged in to address results.) Imdur can cause some dizziness but dizziness with exertion is unusual along with the tingling in arms and legs. Would be concerned something else more acute is going on and would benefit from being checked out in person today such as ED. Chart would also indicate he was still having issues with CP and SOB and had recent abnormal stress test. He can stop Imdur but would recommend getting checked out in ER today to rule out other causes of symptoms and potentially require further cardiac workup.

## 2021-02-12 NOTE — Telephone Encounter (Signed)
Pt c/o medication issue:  1. Name of Medication: isosorbide mononitrate (IMDUR) 30 MG 24 hr tablet  2. How are you currently taking this medication (dosage and times per day)? 1x a day in the morning   3. Are you having a reaction (difficulty breathing--STAT)? yes  4. What is your medication issue? Dizziness, patient fell yesterday when he got dizzy

## 2021-02-12 NOTE — Telephone Encounter (Signed)
Pt states that since starting Imdur 30 mg tablets, he has been dizzy and lightheaded, especially with exertion. Pt stated that he also has a feeling of tingling in his arms and legs. Pt stated that he is unable to check his bp d/t not have a cuff/being able to afford cuff. Will send msg to [facility] office manager to see about ordering pt one.   Please advise.

## 2021-02-16 ENCOUNTER — Other Ambulatory Visit (HOSPITAL_COMMUNITY)
Admission: RE | Admit: 2021-02-16 | Discharge: 2021-02-16 | Disposition: A | Discharge status: Home / Self Care | Payer: Self-pay | Source: Ambulatory Visit | Attending: Physician Assistant | Admitting: Physician Assistant

## 2021-02-16 ENCOUNTER — Other Ambulatory Visit: Payer: Self-pay

## 2021-02-16 ENCOUNTER — Encounter: Payer: Self-pay | Admitting: Physician Assistant

## 2021-02-16 ENCOUNTER — Telehealth: Payer: Self-pay

## 2021-02-16 ENCOUNTER — Ambulatory Visit (INDEPENDENT_AMBULATORY_CARE_PROVIDER_SITE_OTHER): Payer: Self-pay | Admitting: Physician Assistant

## 2021-02-16 VITALS — Ht 68.0 in | Wt 153.0 lb

## 2021-02-16 DIAGNOSIS — I2510 Atherosclerotic heart disease of native coronary artery without angina pectoris: Secondary | ICD-10-CM

## 2021-02-16 DIAGNOSIS — Z01818 Encounter for other preprocedural examination: Secondary | ICD-10-CM

## 2021-02-16 DIAGNOSIS — I4510 Unspecified right bundle-branch block: Secondary | ICD-10-CM

## 2021-02-16 DIAGNOSIS — I10 Essential (primary) hypertension: Secondary | ICD-10-CM

## 2021-02-16 DIAGNOSIS — Z79899 Other long term (current) drug therapy: Secondary | ICD-10-CM

## 2021-02-16 DIAGNOSIS — I951 Orthostatic hypotension: Secondary | ICD-10-CM

## 2021-02-16 DIAGNOSIS — E782 Mixed hyperlipidemia: Secondary | ICD-10-CM

## 2021-02-16 DIAGNOSIS — Z720 Tobacco use: Secondary | ICD-10-CM

## 2021-02-16 LAB — BASIC METABOLIC PANEL
Anion gap: 7 (ref 5–15)
BUN: 17 mg/dL (ref 6–20)
CO2: 24 mmol/L (ref 22–32)
Calcium: 8 mg/dL — ABNORMAL LOW (ref 8.9–10.3)
Chloride: 100 mmol/L (ref 98–111)
Creatinine, Ser: 1.47 mg/dL — ABNORMAL HIGH (ref 0.61–1.24)
GFR, Estimated: 57 mL/min — ABNORMAL LOW (ref >60)
Glucose, Bld: 459 mg/dL — ABNORMAL HIGH (ref 70–99)
Potassium: 4.7 mmol/L (ref 3.5–5.1)
Sodium: 131 mmol/L — ABNORMAL LOW (ref 135–145)

## 2021-02-16 LAB — CBC
HCT: 44.9 % (ref 39.0–52.0)
Hemoglobin: 15.5 g/dL (ref 13.0–17.0)
MCH: 31.5 pg (ref 26.0–34.0)
MCHC: 34.5 g/dL (ref 30.0–36.0)
MCV: 91.3 fL (ref 80.0–100.0)
Platelets: 262 10*3/uL (ref 150–400)
RBC: 4.92 MIL/uL (ref 4.22–5.81)
RDW: 12.4 % (ref 11.5–15.5)
WBC: 14.6 10*3/uL — ABNORMAL HIGH (ref 4.0–10.5)
nRBC: 0 % (ref 0.0–0.2)

## 2021-02-16 MED ORDER — METOPROLOL TARTRATE 25 MG PO TABS: 12.5000 mg | ORAL_TABLET | Freq: Two times a day (BID) | ORAL | 3 refills | Status: DC

## 2021-02-16 NOTE — Telephone Encounter (Signed)
Had a message to call client.  Client states he has questions about his [employer]. Have asked Fay [employer] to please follow up with client.  Discussed recent loss of client's mother expressed support. Client unknown when next Endocrine appointment is scheduled. Not visibile in Epic. Encouraged client to call Dr. Isidoro Donning office to inquire. Client reports understanding.  Discussed his cardiology appt today. He states they stopped his imdur due to blood pressure and "passing out" lightheadedness.   Discussed his blood sugars, client reports "doing pretty good" encouraged client to avoid sugary sodas and starchy, fried and high fat foods. Client reports only drinking sugar-free twist. Encouraged water.  Will discussed [employer] application with Diane and will follow up with client.  [name] RN [employer]

## 2021-02-16 NOTE — Patient Instructions (Signed)
Medication Instructions:  STOP Imdur   DECREASE Lopressor to 12.5 mg twice a day    *If you need a refill on your cardiac medications before your next appointment, please call your pharmacy*   Lab Work: Cbc, bmet TODAY  If you have labs (blood work) drawn today and your tests are completely normal, you will receive your results only by: MyChart Message (if you have MyChart) OR A paper copy in the mail If you have any lab test that is abnormal or we need to change your treatment, we will call you to review the results.   Testing/Procedures: None today    Follow-Up: At [facility], you and your health needs are our priority.  As part of our continuing mission to provide you with exceptional heart care, we have created designated Provider Care Teams.  These Care Teams include your primary Cardiologist (physician) and Advanced Practice Providers (APPs -  Physician Assistants and Nurse Practitioners) who all work together to provide you with the care you need, when you need it.  We recommend signing up for the patient portal called "MyChart".  Sign up information is provided on this After Visit Summary.  MyChart is used to connect with patients for Virtual Visits (Telemedicine).  Patients are able to view lab/test results, encounter notes, upcoming appointments, etc.  Non-urgent messages can be sent to your provider as well.   To learn more about what you can do with MyChart, go to [email or web address].    Your next appointment:   6 month(s)  The format for your next appointment:   In Person  Provider:   Nona Dell, MD   Other Instructions None

## 2021-02-17 NOTE — Telephone Encounter (Signed)
Per note from 9-19 states  "I reviewed the patient with Dr. Diona Browner and because of recent low risk Lexiscan and endoscopy being a low risk procedure he felt the patient could proceed with endoscopy without any further testing. According to the Revised Cardiac Risk Index (RCRI), his Perioperative Risk of Major Cardiac Event is (%): 0.9"

## 2021-02-18 ENCOUNTER — Emergency Department (HOSPITAL_COMMUNITY): Payer: Self-pay

## 2021-02-18 ENCOUNTER — Other Ambulatory Visit: Payer: Self-pay

## 2021-02-18 ENCOUNTER — Ambulatory Visit: Payer: Self-pay | Admitting: "Endocrinology

## 2021-02-18 ENCOUNTER — Emergency Department (HOSPITAL_COMMUNITY)
Admission: EM | Admit: 2021-02-18 | Discharge: 2021-02-18 | Disposition: A | Discharge status: Home / Self Care | Payer: Self-pay | Attending: Emergency Medicine | Admitting: Emergency Medicine

## 2021-02-18 ENCOUNTER — Encounter (HOSPITAL_COMMUNITY): Payer: Self-pay

## 2021-02-18 DIAGNOSIS — W228XXA Striking against or struck by other objects, initial encounter: Secondary | ICD-10-CM | POA: Insufficient documentation

## 2021-02-18 DIAGNOSIS — Y93G3 Activity, cooking and baking: Secondary | ICD-10-CM | POA: Insufficient documentation

## 2021-02-18 DIAGNOSIS — R55 Syncope and collapse: Secondary | ICD-10-CM | POA: Insufficient documentation

## 2021-02-18 DIAGNOSIS — Z5321 Procedure and treatment not carried out due to patient leaving prior to being seen by health care provider: Secondary | ICD-10-CM | POA: Insufficient documentation

## 2021-02-18 DIAGNOSIS — S0990XA Unspecified injury of head, initial encounter: Secondary | ICD-10-CM | POA: Insufficient documentation

## 2021-02-18 LAB — CBC
HCT: 46.3 % (ref 39.0–52.0)
Hemoglobin: 15.8 g/dL (ref 13.0–17.0)
MCH: 30.9 pg (ref 26.0–34.0)
MCHC: 34.1 g/dL (ref 30.0–36.0)
MCV: 90.4 fL (ref 80.0–100.0)
Platelets: 291 10*3/uL (ref 150–400)
RBC: 5.12 MIL/uL (ref 4.22–5.81)
RDW: 12.3 % (ref 11.5–15.5)
WBC: 14.8 10*3/uL — ABNORMAL HIGH (ref 4.0–10.5)
nRBC: 0 % (ref 0.0–0.2)

## 2021-02-18 LAB — BASIC METABOLIC PANEL
Anion gap: 12 (ref 5–15)
BUN: 19 mg/dL (ref 6–20)
CO2: 21 mmol/L — ABNORMAL LOW (ref 22–32)
Calcium: 8.5 mg/dL — ABNORMAL LOW (ref 8.9–10.3)
Chloride: 97 mmol/L — ABNORMAL LOW (ref 98–111)
Creatinine, Ser: 1.51 mg/dL — ABNORMAL HIGH (ref 0.61–1.24)
GFR, Estimated: 56 mL/min — ABNORMAL LOW (ref >60)
Glucose, Bld: 531 mg/dL (ref 70–99)
Potassium: 4.5 mmol/L (ref 3.5–5.1)
Sodium: 130 mmol/L — ABNORMAL LOW (ref 135–145)

## 2021-02-18 LAB — CBG MONITORING, ED: Glucose-Capillary: 501 mg/dL (ref 70–99)

## 2021-02-18 NOTE — ED Notes (Signed)
Pt. Became very angry due to having to wait for a room. This nurse attempted to explain that we were waiting for discharges. Pt. Stated that they only had a head CT and they want a hip and leg CT. This nurse explained that those were the orders I was able to place. Pt. Stated they wished to leave this facility. Pt. Became even angrier and left with out being scene.

## 2021-02-18 NOTE — ED Triage Notes (Signed)
Pt. Arrived via POV. Per pt. They passed out while making a sandwich. Pt. States they hit their head. Pt. Denies being on blood thinners.

## 2021-02-18 NOTE — ED Notes (Signed)
Critical Value-Glucose 531 reported to EDP Zammit at this time.

## 2021-02-19 ENCOUNTER — Encounter (HOSPITAL_COMMUNITY): Payer: Self-pay | Admitting: *Deleted

## 2021-02-19 ENCOUNTER — Emergency Department (HOSPITAL_COMMUNITY)
Admission: EM | Admit: 2021-02-19 | Discharge: 2021-02-19 | Disposition: A | Discharge status: Home / Self Care | Payer: Self-pay | Attending: Emergency Medicine | Admitting: Emergency Medicine

## 2021-02-19 ENCOUNTER — Encounter: Payer: Self-pay | Admitting: Physician Assistant

## 2021-02-19 ENCOUNTER — Ambulatory Visit: Payer: Self-pay | Admitting: Physician Assistant

## 2021-02-19 VITALS — BP 93/69 | HR 104 | Temp 97.7°F | Wt 151.0 lb

## 2021-02-19 DIAGNOSIS — R112 Nausea with vomiting, unspecified: Secondary | ICD-10-CM

## 2021-02-19 DIAGNOSIS — R519 Headache, unspecified: Secondary | ICD-10-CM | POA: Insufficient documentation

## 2021-02-19 DIAGNOSIS — Z79899 Other long term (current) drug therapy: Secondary | ICD-10-CM | POA: Insufficient documentation

## 2021-02-19 DIAGNOSIS — D72829 Elevated white blood cell count, unspecified: Secondary | ICD-10-CM

## 2021-02-19 DIAGNOSIS — E1165 Type 2 diabetes mellitus with hyperglycemia: Secondary | ICD-10-CM

## 2021-02-19 DIAGNOSIS — E860 Dehydration: Secondary | ICD-10-CM

## 2021-02-19 DIAGNOSIS — I2510 Atherosclerotic heart disease of native coronary artery without angina pectoris: Secondary | ICD-10-CM | POA: Insufficient documentation

## 2021-02-19 DIAGNOSIS — J45909 Unspecified asthma, uncomplicated: Secondary | ICD-10-CM | POA: Insufficient documentation

## 2021-02-19 DIAGNOSIS — E1110 Type 2 diabetes mellitus with ketoacidosis without coma: Secondary | ICD-10-CM | POA: Insufficient documentation

## 2021-02-19 DIAGNOSIS — Z794 Long term (current) use of insulin: Secondary | ICD-10-CM | POA: Insufficient documentation

## 2021-02-19 DIAGNOSIS — Z7984 Long term (current) use of oral hypoglycemic drugs: Secondary | ICD-10-CM | POA: Insufficient documentation

## 2021-02-19 DIAGNOSIS — Z7982 Long term (current) use of aspirin: Secondary | ICD-10-CM | POA: Insufficient documentation

## 2021-02-19 DIAGNOSIS — N189 Chronic kidney disease, unspecified: Secondary | ICD-10-CM

## 2021-02-19 DIAGNOSIS — R55 Syncope and collapse: Secondary | ICD-10-CM

## 2021-02-19 DIAGNOSIS — J449 Chronic obstructive pulmonary disease, unspecified: Secondary | ICD-10-CM | POA: Insufficient documentation

## 2021-02-19 DIAGNOSIS — R42 Dizziness and giddiness: Secondary | ICD-10-CM | POA: Insufficient documentation

## 2021-02-19 DIAGNOSIS — I10 Essential (primary) hypertension: Secondary | ICD-10-CM | POA: Insufficient documentation

## 2021-02-19 DIAGNOSIS — Z7951 Long term (current) use of inhaled steroids: Secondary | ICD-10-CM | POA: Insufficient documentation

## 2021-02-19 DIAGNOSIS — F17210 Nicotine dependence, cigarettes, uncomplicated: Secondary | ICD-10-CM | POA: Insufficient documentation

## 2021-02-19 LAB — CBC WITH DIFFERENTIAL/PLATELET
Abs Immature Granulocytes: 0.08 10*3/uL — ABNORMAL HIGH (ref 0.00–0.07)
Basophils Absolute: 0.1 10*3/uL (ref 0.0–0.1)
Basophils Relative: 1 %
Eosinophils Absolute: 0.2 10*3/uL (ref 0.0–0.5)
Eosinophils Relative: 1 %
HCT: 46.6 % (ref 39.0–52.0)
Hemoglobin: 16.2 g/dL (ref 13.0–17.0)
Immature Granulocytes: 1 %
Lymphocytes Relative: 18 %
Lymphs Abs: 3.1 10*3/uL (ref 0.7–4.0)
MCH: 32 pg (ref 26.0–34.0)
MCHC: 34.8 g/dL (ref 30.0–36.0)
MCV: 92.1 fL (ref 80.0–100.0)
Monocytes Absolute: 0.7 10*3/uL (ref 0.1–1.0)
Monocytes Relative: 4 %
Neutro Abs: 12.9 10*3/uL — ABNORMAL HIGH (ref 1.7–7.7)
Neutrophils Relative %: 75 %
Platelets: 250 10*3/uL (ref 150–400)
RBC: 5.06 MIL/uL (ref 4.22–5.81)
RDW: 12.5 % (ref 11.5–15.5)
WBC: 16.9 10*3/uL — ABNORMAL HIGH (ref 4.0–10.5)
nRBC: 0 % (ref 0.0–0.2)

## 2021-02-19 LAB — COMPREHENSIVE METABOLIC PANEL
ALT: 13 U/L (ref 0–44)
AST: 13 U/L — ABNORMAL LOW (ref 15–41)
Albumin: 3.5 g/dL (ref 3.5–5.0)
Alkaline Phosphatase: 105 U/L (ref 38–126)
Anion gap: 10 (ref 5–15)
BUN: 22 mg/dL — ABNORMAL HIGH (ref 6–20)
CO2: 24 mmol/L (ref 22–32)
Calcium: 8.7 mg/dL — ABNORMAL LOW (ref 8.9–10.3)
Chloride: 100 mmol/L (ref 98–111)
Creatinine, Ser: 1.73 mg/dL — ABNORMAL HIGH (ref 0.61–1.24)
GFR, Estimated: 47 mL/min — ABNORMAL LOW (ref >60)
Glucose, Bld: 232 mg/dL — ABNORMAL HIGH (ref 70–99)
Potassium: 4.2 mmol/L (ref 3.5–5.1)
Sodium: 134 mmol/L — ABNORMAL LOW (ref 135–145)
Total Bilirubin: 0.8 mg/dL (ref 0.3–1.2)
Total Protein: 7 g/dL (ref 6.5–8.1)

## 2021-02-19 LAB — URINALYSIS, ROUTINE W REFLEX MICROSCOPIC
Bacteria, UA: NONE SEEN
Bilirubin Urine: NEGATIVE
Glucose, UA: >=500 mg/dL — AB
Ketones, ur: 5 mg/dL — AB
Leukocytes,Ua: NEGATIVE
Nitrite: NEGATIVE
Protein, ur: 100 mg/dL — AB
Specific Gravity, Urine: 1.023 (ref 1.005–1.030)
pH: 5 (ref 5.0–8.0)

## 2021-02-19 LAB — GLUCOSE, POCT (MANUAL RESULT ENTRY): POC Glucose: 168 mg/dl — AB (ref 70–99)

## 2021-02-19 MED ORDER — ONDANSETRON 4 MG PO TBDP: 4.0000 mg | ORAL_TABLET | Freq: Three times a day (TID) | ORAL | 0 refills | Status: DC | PRN

## 2021-02-19 MED ORDER — KETOROLAC TROMETHAMINE 15 MG/ML IJ SOLN
15.0000 mg | Freq: Once | INTRAMUSCULAR | Status: AC
  Administered 2021-02-19: 15 mg via INTRAVENOUS
  Filled 2021-02-19: qty 1

## 2021-02-19 MED ORDER — SODIUM CHLORIDE 0.9 % IV BOLUS
1000.0000 mL | Freq: Once | INTRAVENOUS | Status: AC
  Administered 2021-02-19: 1000 mL via INTRAVENOUS

## 2021-02-19 MED ORDER — AMOXICILLIN-POT CLAVULANATE 875-125 MG PO TABS: 1.0000 | ORAL_TABLET | Freq: Two times a day (BID) | ORAL | 0 refills | Status: DC

## 2021-02-19 MED ORDER — ONDANSETRON 4 MG PO TBDP
4.0000 mg | ORAL_TABLET | Freq: Once | ORAL | Status: AC
  Administered 2021-02-19: 4 mg via ORAL
  Filled 2021-02-19: qty 1

## 2021-02-19 NOTE — ED Provider Notes (Signed)
[facility] EMERGENCY DEPARTMENT Provider Note   CSN: [id number] Arrival date & time: 02/19/21  1447     History Chief Complaint  Patient presents with   Dizziness    [name] [name] is a 50 y.o. male.   Dizziness  This patient is a 50 year old male with a known history of coronary disease, this is moderate, he has a history of COPD, history of diabetes with diabetic neuropathy and also has a history of hypertension migraines and hyperlipidemia.  He had actually come to the emergency department yesterday after having a fall with a head injury, he does not want to give me the details of the fall but states that he was able to get up and walk, he came to the emergency department where he had a CT scan that was ordered through triage as well as some lab work.  It was revealed that he had a negative head CT, his lab work showed hyperglycemia with a white blood cell count of around 15,000 and a creatinine of around 1.5, the seem to be close to the baseline of the last time he had lab work.  He had no infectious symptoms, he still has no infectious symptoms, he does get lightheaded when he stands up, has a headache.  He became upset while in the emergency department yesterday, he elected to leave the emergency department AGAINST MEDICAL ADVICE prior to be formally evaluated by the physician.  He presents today after going to his medical doctor's office where they referred him in because he had some orthostatic changes with standing and suspected that he was dehydrated.  They also told him that he may have an infection because of his leukocytosis.  The patient again denies to me any infectious symptoms at all, he did have some dry heaving this morning but has not had diarrhea, dysuria, cough, shortness of breath, sore throat, runny nose, rashes, tick bites or any other abnormal complaints.  Past Medical History:  Diagnosis Date   Anxiety    Arthritis    Asthma    CAD (coronary artery disease)     Moderate LAD disease 2016 - Dr. Jacinto Halim   Chronic bronchitis [facility])    Chronic upper back pain    COPD (chronic obstructive pulmonary disease) (HCC)    Depression    Diabetic peripheral neuropathy (HCC)    GERD (gastroesophageal reflux disease)    History of gout    Hyperlipemia    Hypertension    Migraine    Noncompliance    Ringing in the ears, bilateral    Sleep apnea 2016   "could not afford CPAP".   Type 2 diabetes mellitus [facility])     Patient Active Problem List   Diagnosis Date Noted   Scrotal abscess 11/19/2020   Severe sepsis (HCC) 09/03/2020   Personal history of noncompliance with medical treatment, presenting hazards to health 05/27/2020   Barrett's esophagus 01/23/2019   Diarrhea 01/23/2019   MDD (major depressive disorder), severe (HCC) 12/17/2018   DKA, type 2 (HCC) 11/07/2018   High anion gap metabolic acidosis 03/22/2018   Epigastric abdominal pain 03/22/2018   Nausea vomiting and diarrhea 03/22/2018   AKI (acute kidney injury) (HCC) 03/22/2018   Uncontrolled type 2 diabetes mellitus with hyperglycemia (HCC) 03/22/2018   Diabetic gastroparesis (HCC) 06/02/2017   ARF (acute renal failure) (HCC) 05/30/2017   Mixed hyperlipidemia 03/31/2017   Diabetic polyneuropathy (HCC) 03/31/2017   Vitamin B12 deficiency 03/31/2017   Vitamin D deficiency 03/31/2017   Right  bundle branch block 02/14/2017   DKA (diabetic ketoacidoses) 10/15/2016   Gastroesophageal reflux disease 10/15/2016   Benign hypertension 10/15/2016   Leukocytosis 11/25/2015   Intractable nausea and vomiting 11/25/2015   Type 2 diabetes mellitus with hyperglycemia, with long-term current use of insulin (HCC) 11/25/2015   Dental abscess 11/25/2015   Nausea and vomiting 11/25/2015   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) 05/02/2015   Coronary arteriosclerosis 03/28/2015   Chest pain 03/27/2015   Anxiety disorder 02/27/2015   Current smoker 02/27/2015   Obesity  02/27/2015   Obstructive sleep apnea 02/27/2015    Past Surgical History:  Procedure Laterality Date   ANKLE SURGERY Right 1982   "had extra bones in there; took them out"   APPENDECTOMY  1975   BIOPSY  12/26/2018   Procedure: BIOPSY;  Surgeon: Malissa Hippo, MD;  Location: [facility];  Service: Endoscopy;;  duodenal biopsies   BIOPSY  03/23/2019   Procedure: BIOPSY;  Surgeon: Malissa Hippo, MD;  Location: [facility];  Service: Endoscopy;;  esophagus   CARDIAC CATHETERIZATION N/A 03/28/2015   Procedure: Left Heart Cath and Coronary Angiography;  Surgeon: Yates Decamp, MD;  Location: [facility] INVASIVE CV LAB;  Service: Cardiovascular;  Laterality: N/A;   CARDIAC CATHETERIZATION N/A 03/28/2015   Procedure: Intravascular Pressure Wire/FFR Study;  Surgeon: Yates Decamp, MD;  Location: [facility] INVASIVE CV LAB;  Service: Cardiovascular;  Laterality: N/A;   CARPAL TUNNEL RELEASE Left ~ 2008   COLONOSCOPY WITH ESOPHAGOGASTRODUODENOSCOPY (EGD)     COLONOSCOPY WITH PROPOFOL N/A 12/24/2019   Procedure: COLONOSCOPY WITH PROPOFOL;  Surgeon: Malissa Hippo, MD;  Location: [facility];  Service: Endoscopy;  Laterality: N/A;  955   ELBOW FRACTURE SURGERY Left ~ 2008   ESOPHAGOGASTRODUODENOSCOPY N/A 12/26/2018   Procedure: ESOPHAGOGASTRODUODENOSCOPY (EGD);  Surgeon: Malissa Hippo, MD;  Location: [facility];  Service: Endoscopy;  Laterality: N/A;   ESOPHAGOGASTRODUODENOSCOPY (EGD) WITH PROPOFOL N/A 03/23/2019   Procedure: ESOPHAGOGASTRODUODENOSCOPY (EGD) WITH PROPOFOL;  Surgeon: Malissa Hippo, MD;  Location: [facility];  Service: Endoscopy;  Laterality: N/A;  7:30   FRACTURE SURGERY     ankle and elbow   PILONIDAL CYST EXCISION N/A 03/24/2017   Procedure: EXCISION CHRONIC  PILONIDAL ABSCESS;  Surgeon: [name] Miyamoto, MD;  Location: [facility];  Service: General;  Laterality: N/A;   SCROTAL EXPLORATION N/A 11/13/2020   Procedure: EXCISION OF SEBACEOUS CYSTS, SCROTUM;  Surgeon: Malen Gauze,  MD;  Location: [facility];  Service: Urology;  Laterality: N/A;   TENDON REPAIR Left ~ 2004   "main tendon in my ankle"       Family History  Problem Relation Age of Onset   Diabetes Mother    Hypertension Mother    Cancer Mother    Throat cancer Mother    Diabetes Father    Hypertension Father    Hyperlipidemia Father    Congestive Heart Failure Sister    Colon cancer Neg Hx    Pancreatic cancer Neg Hx    Liver disease Neg Hx     Social History   Tobacco Use   Smoking status: Every Day    Packs/day: 0.50    Years: 34.00    Pack years: 17.00    Types: Cigarettes   Smokeless tobacco: Never   Tobacco comments:    1/2 pack a day  Vaping Use   Vaping Use: Never used  Substance Use Topics   Alcohol use: Never  Alcohol/week: 0.0 standard drinks   Drug use: Never    Home Medications Prior to Admission medications   Medication Sig Start Date End Date Taking? Authorizing Provider  amoxicillin-clavulanate (AUGMENTIN) 875-125 MG tablet Take 1 tablet by mouth every 12 (twelve) hours. 02/19/21  Yes Eber Hong, MD  ondansetron (ZOFRAN ODT) 4 MG disintegrating tablet Take 1 tablet (4 mg total) by mouth every 8 (eight) hours as needed for nausea. 02/19/21  Yes Eber Hong, MD  albuterol (PROVENTIL HFA) 108 (90 Base) MCG/ACT inhaler INHALE 2 PUFFS BY MOUTH EVERY 6 HOURS AS NEEDED FOR COUGHING, WHEEZING, OR SHORTNESS OF BREATH 12/29/20   [name], PA-C  ARIPiprazole (ABILIFY) 10 MG tablet TAKE 1 Tablet BY MOUTH ONCE EVERY NIGHT AT BEDTIME DOSE CHANGE 11/10/20   [provider]  ARIPiprazole (ABILIFY) 5 MG tablet Take 5 mg by mouth daily. Patient not taking: No sig reported    [provider]  ASPIRIN LOW DOSE 81 MG EC tablet Take 81 mg by mouth every morning. 09/05/20   [provider]  atorvastatin (LIPITOR) 80 MG tablet TAKE 1 Tablet BY MOUTH ONCE EVERY DAY 12/29/20   [name], PA-C  famotidine (PEPCID) 40 MG tablet Take 1 tablet (40 mg total)  by mouth 2 (two) times daily. 10/16/20   Dolores Frame, MD  Fluticasone-Salmeterol (ADVAIR DISKUS IN) Inhale 1 puff into the lungs daily. Rinse mouth after use    [provider]  gabapentin (NEURONTIN) 100 MG capsule Take 1 capsule (100 mg total) by mouth 3 (three) times daily. 01/09/21   Dione Booze, MD  glipiZIDE (GLUCOTROL XL) 5 MG 24 hr tablet Take 1 tablet (5 mg total) by mouth daily with breakfast. 11/10/20   Nida, Denman George, MD  insulin aspart protamine - aspart (NOVOLOG MIX 70/30 FLEXPEN) (70-30) 100 UNIT/ML FlexPen Inject 40 Units into the skin 2 (two) times daily with a meal. Only with breakfast and supper when premeal glucose is above 90 Patient not taking: No sig reported 01/14/21   Roma Kayser, MD  insulin glargine (LANTUS) 100 UNIT/ML injection Inject 60 Units into the skin daily.    [provider]  insulin glulisine (APIDRA) 100 UNIT/ML injection Inject 15 Units into the skin 3 (three) times daily before meals.    [provider]  metFORMIN (GLUCOPHAGE) 500 MG tablet TAKE 1 Tablet  BY MOUTH TWICE DAILY WITH A MEAL 02/12/21   Nida, Denman George, MD  metoCLOPramide (REGLAN) 5 MG tablet Take 1 tablet (5 mg total) by mouth 4 (four) times daily -  before meals and at bedtime. 11/22/19   Tawni Pummel B, PA-C  metoprolol tartrate (LOPRESSOR) 25 MG tablet Take 0.5 tablets (12.5 mg total) by mouth 2 (two) times daily. 02/16/21   Dyann Kief, PA-C  nitroGLYCERIN (NITROSTAT) 0.4 MG SL tablet Place 1 tablet (0.4 mg total) under the tongue every 5 (five) minutes as needed for chest pain. Patient not taking: Reported on 02/19/2021 10/05/17   [name], PA-C  polyethylene glycol (MIRALAX / GLYCOLAX) 17 g packet Take 17 g by mouth daily. Patient not taking: Reported on 02/19/2021 11/11/20   Gilda Crease, MD  promethazine (PHENERGAN) 25 MG suppository Place 1 suppository (25 mg total) rectally every 8 (eight) hours as needed for  refractory nausea / vomiting. Patient not taking: Reported on 02/19/2021 12/20/18   Vassie Loll, MD  sertraline (ZOLOFT) 100 MG tablet Take 1 tablet by mouth daily. 01/09/20   [provider]  traZODone (  DESYREL) 50 MG tablet Take 50 mg by mouth at bedtime. 10/03/20   [provider]    Allergies    Omeprazole magnesium, Bee venom, Esomeprazole, and Morphine and related  Review of Systems   Review of Systems  Neurological:  Positive for dizziness.  All other systems reviewed and are negative.  Physical Exam Updated Vital Signs BP (!) 169/98   Pulse 87   Temp 98.2 F (36.8 C) (Oral)   Resp (!) 29   Ht 1.727 m (5\' 8" )   Wt 68.5 kg   SpO2 96%   BMI 22.98 kg/m   Physical Exam Vitals and nursing note reviewed.  Constitutional:      General: He is not in acute distress.    Appearance: He is well-developed.  HENT:     Head: Normocephalic and atraumatic.     Mouth/Throat:     Mouth: Mucous membranes are dry.     Pharynx: No oropharyngeal exudate.  Eyes:     General: No scleral icterus.       Right eye: No discharge.        Left eye: No discharge.     Conjunctiva/sclera: Conjunctivae normal.     Pupils: Pupils are equal, round, and reactive to light.  Neck:     Thyroid: No thyromegaly.     Vascular: No JVD.  Cardiovascular:     Rate and Rhythm: Normal rate and regular rhythm.     Heart sounds: Normal heart sounds. No murmur heard.   No friction rub. No gallop.  Pulmonary:     Effort: Pulmonary effort is normal. No respiratory distress.     Breath sounds: Normal breath sounds. No wheezing or rales.  Abdominal:     General: Bowel sounds are normal. There is no distension.     Palpations: Abdomen is soft. There is no mass.     Tenderness: There is no abdominal tenderness.  Musculoskeletal:        General: No tenderness. Normal range of motion.     Cervical back: Normal range of motion and neck supple.  Lymphadenopathy:     Cervical: No cervical  adenopathy.  Skin:    General: Skin is warm and dry.     Findings: No erythema or rash.  Neurological:     Mental Status: He is alert.     Coordination: Coordination normal.     Comments: The patient is awake alert and able to speak in full sentences, his gait is normal, his strength in all 4 extremities is normal, coordination is normal, cranial nerves III through XII are unremarkable, peripheral visual fields are normal  Psychiatric:        Behavior: Behavior normal.    ED Results / Procedures / Treatments   Labs (all labs ordered are listed, but only abnormal results are displayed) Labs Reviewed  CBC WITH DIFFERENTIAL/PLATELET - Abnormal; Notable for the following components:      Result Value   WBC 16.9 (*)    Neutro Abs 12.9 (*)    Abs Immature Granulocytes 0.08 (*)    All other components within normal limits  COMPREHENSIVE METABOLIC PANEL - Abnormal; Notable for the following components:   Sodium 134 (*)    Glucose, Bld 232 (*)    BUN 22 (*)    Creatinine, Ser 1.73 (*)    Calcium 8.7 (*)    AST 13 (*)    GFR, Estimated 47 (*)    All other components within normal limits  URINALYSIS, ROUTINE W REFLEX MICROSCOPIC - Abnormal; Notable for the following components:   APPearance HAZY (*)    Glucose, UA >=500 (*)    Hgb urine dipstick SMALL (*)    Ketones, ur 5 (*)    Protein, ur 100 (*)    All other components within normal limits    EKG None  Radiology CT HEAD WO CONTRAST ( )  Result Date: 02/18/2021 CLINICAL DATA:  Status post fall. EXAM: CT HEAD WITHOUT CONTRAST TECHNIQUE: Contiguous axial images were obtained from the base of the skull through the vertex without intravenous contrast. COMPARISON:  None. FINDINGS: Brain: There is mild cerebral atrophy with widening of the extra-axial spaces and ventricular dilatation. There are areas of decreased attenuation within the white matter tracts of the supratentorial brain, consistent with microvascular disease changes.  Vascular: No hyperdense vessel or unexpected calcification. Skull: Normal. Negative for fracture or focal lesion. Sinuses/Orbits: No acute finding. Other: None. IMPRESSION: 1. Mild cerebral atrophy. 2. No acute intracranial abnormality. Electronically Signed   By: Aram Candela M.D.   On: 02/18/2021 19:29    Procedures Procedures   Medications Ordered in ED Medications  ondansetron (ZOFRAN-ODT) disintegrating tablet 4 mg (4 mg Oral Given 02/19/21 1604)  sodium chloride 0.9 % bolus 1,000 mL (0 mLs Intravenous Stopped 02/19/21 1915)    ED Course  I have reviewed the triage vital signs and the nursing notes.  Pertinent labs & imaging results that were available during my care of the patient were reviewed by me and considered in my medical decision making (see chart for details).    MDM Rules/Calculators/A&P                           I do not see any findings of infection on exam, blood pressure here is 135/71 with a temperature of 98.2.  I will recheck a couple of laboratory test, his blood sugar is down to 160 today as opposed to being around 500 yesterday.  He had there was no signs of DKA on lab work from yesterday.  The CT scan was negative for any signs of intracranial abnormalities at that time.  IV fluids, recheck labs, anticipate discharge if everything is reasonable and reassuring.  White blood cell count slightly elevated at 16,000, vital signs have been rather unremarkable, he is not having any coughing or shortness of breath and his lung sounds are clear.  Laboratory work-up otherwise appears better including his metabolic panel, no signs of DKA.  Urinalysis clean.  Stable for discharge  Final Clinical Impression(s) / ED Diagnoses Final diagnoses:  Nonintractable headache, unspecified chronicity pattern, unspecified headache type  Dizziness    Rx / DC Orders ED Discharge Orders          Ordered    amoxicillin-clavulanate (AUGMENTIN) 875-125 MG tablet  Every 12 hours         02/19/21 2046    ondansetron (ZOFRAN ODT) 4 MG disintegrating tablet  Every 8 hours PRN        02/19/21 2046             Eber Hong, MD 02/19/21 2047

## 2021-02-19 NOTE — ED Triage Notes (Signed)
Seen here yesterday, left prior to evaluation and lab review

## 2021-02-19 NOTE — Discharge Instructions (Signed)
Please have your family doctor recheck your blood work within 2 weeks, take Augmentin twice daily for 7 days, Zofran as needed every 6 hours for nausea, drink plenty of clear liquids and return to the emergency department for any worsening symptoms

## 2021-02-19 NOTE — ED Notes (Signed)
Discharge instructions discussed with pt. Pt verbalized understanding with no questions at this time.  

## 2021-02-19 NOTE — Progress Notes (Signed)
BP 93/69   Pulse (!) 104   Temp 97.7 F (36.5 C)   Wt 151 lb (68.5 kg)   SpO2 96%   BMI 22.96 kg/m    Subjective:    Patient ID: [name] [name], male    DOB: 1970-08-01, 50 y.o.   MRN: [id number]  HPI: [name] [name] is a 50 y.o. male presenting on 02/19/2021 for Fall (Hit head in fall.)   HPI   Pt had a negative covid 19 screening questionnaire.    Pt says had LOC yesterday.  He went to ER but LWBS.   He says he was walking to the refrigerator and just lost consciousness causing him to fall to the floor.  He says he was only out for a few minutes; someone else was in the home during the episode.  He says he is Feeling drained lately.  BS this am was 300-something.    He did not check his bs at all yesterday.    Today he feels "like crap".    He feels tired, drained,   He has had Some chest tightness today.  Low probability myoscan 01/13/21  Labs/CT done yesterday reviewed- white count elevated.    His Testicular abscess is all resolved.   He doesn't know where he has infection- no coughing.  No dysuria.      Relevant past medical, surgical, family and social history reviewed and updated as indicated. Interim medical history since our last visit reviewed. Allergies and medications reviewed and updated.   Current Outpatient Medications:    albuterol (PROVENTIL HFA) 108 (90 Base) MCG/ACT inhaler, INHALE 2 PUFFS BY MOUTH EVERY 6 HOURS AS NEEDED FOR COUGHING, WHEEZING, OR SHORTNESS OF BREATH, Disp: 20.1 g, Rfl: 0   ARIPiprazole (ABILIFY) 10 MG tablet, TAKE 1 Tablet BY MOUTH ONCE EVERY NIGHT AT BEDTIME DOSE CHANGE, Disp: , Rfl:    ASPIRIN LOW DOSE 81 MG EC tablet, Take 81 mg by mouth every morning., Disp: , Rfl:    atorvastatin (LIPITOR) 80 MG tablet, TAKE 1 Tablet BY MOUTH ONCE EVERY DAY, Disp: 90 tablet, Rfl: 0   famotidine (PEPCID) 40 MG tablet, Take 1 tablet (40 mg total) by mouth 2 (two) times daily., Disp: 180 tablet, Rfl: 3   Fluticasone-Salmeterol (ADVAIR  DISKUS IN), Inhale 1 puff into the lungs daily. Rinse mouth after use, Disp: , Rfl:    gabapentin (NEURONTIN) 100 MG capsule, Take 1 capsule (100 mg total) by mouth 3 (three) times daily., Disp: 90 capsule, Rfl: 0   glipiZIDE (GLUCOTROL XL) 5 MG 24 hr tablet, Take 1 tablet (5 mg total) by mouth daily with breakfast., Disp: 30 tablet, Rfl: 3   insulin glargine (LANTUS) 100 UNIT/ML injection, Inject 60 Units into the skin daily., Disp: , Rfl:    insulin glulisine (APIDRA) 100 UNIT/ML injection, Inject 15 Units into the skin 3 (three) times daily before meals., Disp: , Rfl:    metFORMIN (GLUCOPHAGE) 500 MG tablet, TAKE 1 Tablet  BY MOUTH TWICE DAILY WITH A MEAL, Disp: 180 tablet, Rfl: 0   metoCLOPramide (REGLAN) 5 MG tablet, Take 1 tablet (5 mg total) by mouth 4 (four) times daily -  before meals and at bedtime., Disp: 120 tablet, Rfl: 1   metoprolol tartrate (LOPRESSOR) 25 MG tablet, Take 0.5 tablets (12.5 mg total) by mouth 2 (two) times daily., Disp: 90 tablet, Rfl: 3   sertraline (ZOLOFT) 100 MG tablet, Take 1 tablet by mouth daily., Disp: , Rfl:    traZODone (  DESYREL) 50 MG tablet, Take 50 mg by mouth at bedtime., Disp: , Rfl:    ARIPiprazole (ABILIFY) 5 MG tablet, Take 5 mg by mouth daily. (Patient not taking: No sig reported), Disp: , Rfl:    insulin aspart protamine - aspart (NOVOLOG MIX 70/30 FLEXPEN) (70-30) 100 UNIT/ML FlexPen, Inject 40 Units into the skin 2 (two) times daily with a meal. Only with breakfast and supper when premeal glucose is above 90 (Patient not taking: No sig reported), Disp: 30 mL, Rfl: 2   nitroGLYCERIN (NITROSTAT) 0.4 MG SL tablet, Place 1 tablet (0.4 mg total) under the tongue every 5 (five) minutes as needed for chest pain. (Patient not taking: Reported on 02/19/2021), Disp: 25 tablet, Rfl: 3   polyethylene glycol (MIRALAX / GLYCOLAX) 17 g packet, Take 17 g by mouth daily. (Patient not taking: Reported on 02/19/2021), Disp: 14 each, Rfl: 0   promethazine (PHENERGAN) 25  MG suppository, Place 1 suppository (25 mg total) rectally every 8 (eight) hours as needed for refractory nausea / vomiting. (Patient not taking: Reported on 02/19/2021), Disp: 12 each, Rfl: 0    Review of Systems  Per HPI unless specifically indicated above     Objective:    BP 93/69   Pulse (!) 104   Temp 97.7 F (36.5 C)   Wt 151 lb (68.5 kg)   SpO2 96%   BMI 22.96 kg/m   Wt Readings from Last 3 Encounters:  02/19/21 151 lb (68.5 kg)  02/18/21 153 lb (69.4 kg)  02/16/21 153 lb (69.4 kg)    No data found.  Orthostatic Vitals for the past 48 hrs (Last 6 readings):  Patient Position Orthostatic BP Orthostatic Pulse  02/19/21 1449 Supine 107/78 64  02/19/21 1450 Sitting 90/68 99   Was unable to obtain standing orthostatics due to pt felt too light-headed and was swaying.      Physical Exam Vitals reviewed.  Constitutional:      General: He is not in acute distress.    Appearance: He is well-developed. He is ill-appearing.  HENT:     Head: Normocephalic and atraumatic.  Cardiovascular:     Rate and Rhythm: Regular rhythm.  Pulmonary:     Breath sounds: Normal breath sounds. No wheezing.     Comments: Stops talking after several words to rest Abdominal:     General: Abdomen is flat.     Palpations: Abdomen is soft.     Tenderness: There is no abdominal tenderness.  Musculoskeletal:     Cervical back: Neck supple.  Lymphadenopathy:     Cervical: No cervical adenopathy.  Skin:    General: Skin is warm.     Comments: Sweaty skin  Neurological:     Mental Status: He is alert and oriented to person, place, and time.  Psychiatric:        Behavior: Behavior is cooperative.      Results for orders placed or performed during the hospital encounter of 02/18/21  Basic metabolic panel  Result Value Ref Range   Sodium 130 (L) 135 - 145 mmol/L   Potassium 4.5 3.5 - 5.1 mmol/L   Chloride 97 (L) 98 - 111 mmol/L   CO2 21 (L) 22 - 32 mmol/L   Glucose, Bld 531 (HH)  70 - 99 mg/dL   BUN 19 6 - 20 mg/dL   Creatinine, Ser 6.16 (H) 0.61 - 1.24 mg/dL   Calcium 8.5 (L) 8.9 - 10.3 mg/dL   GFR, Estimated 56 (L) >60 mL/min  Anion gap 12 5 - 15  CBC  Result Value Ref Range   WBC 14.8 (H) 4.0 - 10.5 K/uL   RBC 5.12 4.22 - 5.81 MIL/uL   Hemoglobin 15.8 13.0 - 17.0 g/dL   HCT 70.3 50.0 - 93.8 %   MCV 90.4 80.0 - 100.0 fL   MCH 30.9 26.0 - 34.0 pg   MCHC 34.1 30.0 - 36.0 g/dL   RDW 18.2 99.3 - 71.6 %   Platelets 291 150 - 400 K/uL   nRBC 0.0 0.0 - 0.2 %  CBG monitoring, ED  Result Value Ref Range   Glucose-Capillary 501 (HH) 70 - 99 mg/dL    Glucose 967        Assessment & Plan:    Encounter Diagnoses  Name Primary?   Dehydration Yes   Non-intractable vomiting with nausea, unspecified vomiting type    Syncope, unspecified syncope type    Leukocytosis, unspecified type    Chronic kidney disease, unspecified CKD stage    Uncontrolled type 2 diabetes mellitus with hyperglycemia (HCC)      Discussed with pt that he is dehydrated which caused the LOC.  Discussed that his labs look like he may have infection somewhere.  Discussed that dehydration worsens his already bad renal function.  Discussed that his best option would be to return to the ER where he can get IVF and possible find a source for his infection.  He agrees.  Recommended EMS but he refused.  Discussed that he needs to be patient and not become angry, that the ER is really busy and there is a Sales promotion account executive.  He says he will be nice.

## 2021-02-21 ENCOUNTER — Encounter (HOSPITAL_COMMUNITY): Payer: Self-pay | Admitting: *Deleted

## 2021-02-21 ENCOUNTER — Other Ambulatory Visit: Payer: Self-pay

## 2021-02-21 ENCOUNTER — Emergency Department (HOSPITAL_COMMUNITY)
Admission: EM | Admit: 2021-02-21 | Discharge: 2021-02-21 | Disposition: A | Discharge status: Home / Self Care | Payer: Self-pay | Attending: Emergency Medicine | Admitting: Emergency Medicine

## 2021-02-21 ENCOUNTER — Emergency Department (HOSPITAL_COMMUNITY): Payer: Self-pay

## 2021-02-21 DIAGNOSIS — S32010A Wedge compression fracture of first lumbar vertebra, initial encounter for closed fracture: Secondary | ICD-10-CM | POA: Insufficient documentation

## 2021-02-21 DIAGNOSIS — E1142 Type 2 diabetes mellitus with diabetic polyneuropathy: Secondary | ICD-10-CM | POA: Insufficient documentation

## 2021-02-21 DIAGNOSIS — F17210 Nicotine dependence, cigarettes, uncomplicated: Secondary | ICD-10-CM | POA: Insufficient documentation

## 2021-02-21 DIAGNOSIS — M25551 Pain in right hip: Secondary | ICD-10-CM | POA: Insufficient documentation

## 2021-02-21 DIAGNOSIS — Z7982 Long term (current) use of aspirin: Secondary | ICD-10-CM | POA: Insufficient documentation

## 2021-02-21 DIAGNOSIS — J449 Chronic obstructive pulmonary disease, unspecified: Secondary | ICD-10-CM | POA: Insufficient documentation

## 2021-02-21 DIAGNOSIS — I10 Essential (primary) hypertension: Secondary | ICD-10-CM | POA: Insufficient documentation

## 2021-02-21 DIAGNOSIS — E1140 Type 2 diabetes mellitus with diabetic neuropathy, unspecified: Secondary | ICD-10-CM | POA: Insufficient documentation

## 2021-02-21 DIAGNOSIS — J45909 Unspecified asthma, uncomplicated: Secondary | ICD-10-CM | POA: Insufficient documentation

## 2021-02-21 DIAGNOSIS — Z79899 Other long term (current) drug therapy: Secondary | ICD-10-CM | POA: Insufficient documentation

## 2021-02-21 DIAGNOSIS — X58XXXA Exposure to other specified factors, initial encounter: Secondary | ICD-10-CM | POA: Insufficient documentation

## 2021-02-21 DIAGNOSIS — E1110 Type 2 diabetes mellitus with ketoacidosis without coma: Secondary | ICD-10-CM | POA: Insufficient documentation

## 2021-02-21 DIAGNOSIS — Z7984 Long term (current) use of oral hypoglycemic drugs: Secondary | ICD-10-CM | POA: Insufficient documentation

## 2021-02-21 DIAGNOSIS — I25118 Atherosclerotic heart disease of native coronary artery with other forms of angina pectoris: Secondary | ICD-10-CM | POA: Insufficient documentation

## 2021-02-21 DIAGNOSIS — Z794 Long term (current) use of insulin: Secondary | ICD-10-CM | POA: Insufficient documentation

## 2021-02-21 MED ORDER — HYDROCODONE-ACETAMINOPHEN 5-325 MG PO TABS: 1.0000 | ORAL_TABLET | Freq: Four times a day (QID) | ORAL | 0 refills | Status: DC | PRN

## 2021-02-21 NOTE — ED Triage Notes (Signed)
Back pain seen 2 days ago

## 2021-02-21 NOTE — ED Provider Notes (Signed)
[facility] EMERGENCY DEPARTMENT Provider Note   CSN: [id number] Arrival date & time: 02/21/21  1605     History Chief Complaint  Patient presents with   Back Pain    OLIN [name] is a 50 y.o. male with a history including asthma, CAD, diabetes, GERD and hypertension presenting for evaluation of persistent midline low back and right hip pain.  He has had several visits here, first 3 days ago secondary to a syncopal event causing head injury and this low back and right hip pain.  He underwent a head CT scan which was negative for acute injury, but left prior to being seen and therefore his back and hip were not addressed.  He saw his PCP the next day who referred him back over here secondary to dehydration.  Lab testing was performed, he was given IV fluids and feels better in this regard but does continue to have pain in his lower back and right hip.  He denies weakness and numbness in his lower extremities, denies urinary retention or incontinence.  Pain is worsened with movement and palpation, particularly midline of his upper lumbar region.  He is weightbearing and ambulatory but has pain that radiates from his right pelvis to his buttock region.  He has found no alleviators for symptoms he took some Tylenol without relief.  The history is provided by the patient.      Past Medical History:  Diagnosis Date   Anxiety    Arthritis    Asthma    CAD (coronary artery disease)    Moderate LAD disease 2016 - Dr. 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   Type 2 diabetes mellitus (HCC)     Patient Active Problem List   Diagnosis Date Noted   Scrotal  abscess 11/19/2020   Severe sepsis (HCC) 09/03/2020   Personal history of noncompliance with medical treatment, presenting hazards to health 05/27/2020   Barrett's esophagus 01/23/2019   Diarrhea 01/23/2019   MDD (major depressive disorder), severe (HCC) 12/17/2018   DKA, type 2 (HCC) 11/07/2018   High anion gap metabolic acidosis 03/22/2018   Epigastric abdominal pain 03/22/2018   Nausea vomiting and diarrhea 03/22/2018   AKI (acute kidney injury) (HCC) 03/22/2018   Uncontrolled type 2 diabetes mellitus with hyperglycemia (HCC) 03/22/2018   Diabetic gastroparesis (HCC) 06/02/2017   ARF (acute renal failure) (HCC) 05/30/2017   Mixed hyperlipidemia 03/31/2017   Diabetic polyneuropathy (HCC) 03/31/2017   Vitamin B12 deficiency 03/31/2017   Vitamin D deficiency 03/31/2017   Right bundle branch block 02/14/2017   DKA (diabetic ketoacidoses) 10/15/2016   Gastroesophageal reflux disease 10/15/2016   Benign hypertension 10/15/2016   Leukocytosis 11/25/2015   Intractable nausea and vomiting 11/25/2015   Type 2 diabetes mellitus with hyperglycemia, with long-term current use of insulin (HCC) 11/25/2015   Dental abscess 11/25/2015   Nausea and vomiting 11/25/2015   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) 05/02/2015   Coronary arteriosclerosis 03/28/2015   Chest pain 03/27/2015   Anxiety disorder 02/27/2015   Current smoker 02/27/2015  Obesity 02/27/2015   Obstructive sleep apnea 02/27/2015    Past Surgical History:  Procedure Laterality Date   ANKLE SURGERY Right 1982   "had extra bones in there; took them out"   APPENDECTOMY  1975   BIOPSY  12/26/2018   Procedure: BIOPSY;  Surgeon: Malissa Hippo, MD;  Location: [facility] ENDO SUITE;  Service: Endoscopy;;  duodenal biopsies   BIOPSY  03/23/2019   Procedure: BIOPSY;  Surgeon: Malissa Hippo, MD;  Location: [facility];  Service: Endoscopy;;  esophagus   CARDIAC CATHETERIZATION N/A 03/28/2015    Procedure: Left Heart Cath and Coronary Angiography;  Surgeon: Yates Decamp, MD;  Location: [facility] INVASIVE CV LAB;  Service: Cardiovascular;  Laterality: N/A;   CARDIAC CATHETERIZATION N/A 03/28/2015   Procedure: Intravascular Pressure Wire/FFR Study;  Surgeon: Yates Decamp, MD;  Location: [facility] INVASIVE CV LAB;  Service: Cardiovascular;  Laterality: N/A;   CARPAL TUNNEL RELEASE Left ~ 2008   COLONOSCOPY WITH ESOPHAGOGASTRODUODENOSCOPY (EGD)     COLONOSCOPY WITH PROPOFOL N/A 12/24/2019   Procedure: COLONOSCOPY WITH PROPOFOL;  Surgeon: Malissa Hippo, MD;  Location: [facility];  Service: Endoscopy;  Laterality: N/A;  955   ELBOW FRACTURE SURGERY Left ~ 2008   ESOPHAGOGASTRODUODENOSCOPY N/A 12/26/2018   Procedure: ESOPHAGOGASTRODUODENOSCOPY (EGD);  Surgeon: Malissa Hippo, MD;  Location: [facility];  Service: Endoscopy;  Laterality: N/A;   ESOPHAGOGASTRODUODENOSCOPY (EGD) WITH PROPOFOL N/A 03/23/2019   Procedure: ESOPHAGOGASTRODUODENOSCOPY (EGD) WITH PROPOFOL;  Surgeon: Malissa Hippo, MD;  Location: [facility];  Service: Endoscopy;  Laterality: N/A;  7:30   FRACTURE SURGERY     ankle and elbow   PILONIDAL CYST EXCISION N/A 03/24/2017   Procedure: EXCISION CHRONIC  PILONIDAL ABSCESS;  Surgeon: [name] Miyamoto, MD;  Location: [facility] ORS;  Service: General;  Laterality: N/A;   SCROTAL EXPLORATION N/A 11/13/2020   Procedure: EXCISION OF SEBACEOUS CYSTS, SCROTUM;  Surgeon: Malen Gauze, MD;  Location: [facility] OR[facility];  Service: Urology;  Laterality: N/A;   TENDON REPAIR Left ~ 2004   "main tendon in my ankle"       Family History  Problem Relation Age of Onset   Diabetes Mother    Hypertension Mother    Cancer Mother    Throat cancer Mother    Diabetes Father    Hypertension Father    Hyperlipidemia Father    Congestive Heart Failure Sister    Colon cancer Neg Hx    Pancreatic cancer Neg Hx    Liver disease Neg Hx     Social History   Tobacco Use   Smoking status: Every Day     Packs/day: 0.50    Years: 34.00    Pack years: 17.00    Types: Cigarettes   Smokeless tobacco: Never   Tobacco comments:    1/2 pack a day  Vaping Use   Vaping Use: Never used  Substance Use Topics   Alcohol use: Never    Alcohol/week: 0.0 standard drinks   Drug use: Never    Home Medications Prior to Admission medications   Medication Sig Start Date End Date Taking? Authorizing Provider  HYDROcodone-acetaminophen (NORCO/VICODIN) 5-325 MG tablet Take 1 tablet by mouth every 6 (six) hours as needed. 02/21/21  Yes Murl Golladay, Raynelle Fanning, PA-C  HYDROcodone-acetaminophen (NORCO/VICODIN) 5-325 MG tablet Take 1 tablet by mouth every 6 (six) hours as needed for moderate pain. 02/21/21  Yes Loney Peto, Raynelle Fanning, PA-C  albuterol (PROVENTIL HFA) 108 (90 Base) MCG/ACT inhaler INHALE 2 PUFFS BY MOUTH EVERY  6 HOURS AS NEEDED FOR COUGHING, WHEEZING, OR SHORTNESS OF BREATH 12/29/20   [name], PA-C  amoxicillin-clavulanate (AUGMENTIN) 875-125 MG tablet Take 1 tablet by mouth every 12 (twelve) hours. 02/19/21   Eber Hong, MD  ARIPiprazole (ABILIFY) 10 MG tablet TAKE 1 Tablet BY MOUTH ONCE EVERY NIGHT AT BEDTIME DOSE CHANGE 11/10/20   [provider]  ARIPiprazole (ABILIFY) 5 MG tablet Take 5 mg by mouth daily. Patient not taking: No sig reported    [provider]  ASPIRIN LOW DOSE 81 MG EC tablet Take 81 mg by mouth every morning. 09/05/20   [provider]  atorvastatin (LIPITOR) 80 MG tablet TAKE 1 Tablet BY MOUTH ONCE EVERY DAY 12/29/20   [name], PA-C  famotidine (PEPCID) 40 MG tablet Take 1 tablet (40 mg total) by mouth 2 (two) times daily. 10/16/20   Dolores Frame, MD  Fluticasone-Salmeterol (ADVAIR DISKUS IN) Inhale 1 puff into the lungs daily. Rinse mouth after use    [provider]  gabapentin (NEURONTIN) 100 MG capsule Take 1 capsule (100 mg total) by mouth 3 (three) times daily. 01/09/21   Dione Booze, MD  glipiZIDE (GLUCOTROL XL) 5 MG 24 hr tablet  Take 1 tablet (5 mg total) by mouth daily with breakfast. 11/10/20   Nida, Denman George, MD  insulin aspart protamine - aspart (NOVOLOG MIX 70/30 FLEXPEN) (70-30) 100 UNIT/ML FlexPen Inject 40 Units into the skin 2 (two) times daily with a meal. Only with breakfast and supper when premeal glucose is above 90 Patient not taking: No sig reported 01/14/21   Roma Kayser, MD  insulin glargine (LANTUS) 100 UNIT/ML injection Inject 60 Units into the skin daily.    [provider]  insulin glulisine (APIDRA) 100 UNIT/ML injection Inject 15 Units into the skin 3 (three) times daily before meals.    [provider]  metFORMIN (GLUCOPHAGE) 500 MG tablet TAKE 1 Tablet  BY MOUTH TWICE DAILY WITH A MEAL 02/12/21   Nida, Denman George, MD  metoCLOPramide (REGLAN) 5 MG tablet Take 1 tablet (5 mg total) by mouth 4 (four) times daily -  before meals and at bedtime. 11/22/19   Tawni Pummel B, PA-C  metoprolol tartrate (LOPRESSOR) 25 MG tablet Take 0.5 tablets (12.5 mg total) by mouth 2 (two) times daily. 02/16/21   Dyann Kief, PA-C  nitroGLYCERIN (NITROSTAT) 0.4 MG SL tablet Place 1 tablet (0.4 mg total) under the tongue every 5 (five) minutes as needed for chest pain. Patient not taking: Reported on 02/19/2021 10/05/17   [name], PA-C  ondansetron (ZOFRAN ODT) 4 MG disintegrating tablet Take 1 tablet (4 mg total) by mouth every 8 (eight) hours as needed for nausea. 02/19/21   Eber Hong, MD  polyethylene glycol (MIRALAX / GLYCOLAX) 17 g packet Take 17 g by mouth daily. Patient not taking: Reported on 02/19/2021 11/11/20   Gilda Crease, MD  promethazine (PHENERGAN) 25 MG suppository Place 1 suppository (25 mg total) rectally every 8 (eight) hours as needed for refractory nausea / vomiting. Patient not taking: Reported on 02/19/2021 12/20/18   Vassie Loll, MD  sertraline (ZOLOFT) 100 MG tablet Take 1 tablet by mouth daily. 01/09/20   [provider]   traZODone (DESYREL) 50 MG tablet Take 50 mg by mouth at bedtime. 10/03/20   [provider]    Allergies    Omeprazole magnesium, Bee venom, Esomeprazole, and Morphine and related  Review of Systems   Review of Systems  Constitutional:  Negative for chills and fever.  Respiratory:  Negative for shortness of breath.   Cardiovascular:  Negative for chest pain.  Gastrointestinal:  Negative for nausea and vomiting.  Musculoskeletal:  Positive for arthralgias and back pain. Negative for joint swelling and myalgias.  Neurological:  Negative for weakness, numbness and headaches.  All other systems reviewed and are negative.  Physical Exam Updated Vital Signs BP (!) 189/113 (BP Location: Left Arm)   Pulse 93   Temp 97.8 F (36.6 C)   Resp 19   SpO2 100%   Physical Exam Vitals and nursing note reviewed.  Constitutional:      Appearance: He is well-developed.  HENT:     Head: Normocephalic.  Eyes:     Conjunctiva/sclera: Conjunctivae normal.  Cardiovascular:     Rate and Rhythm: Normal rate.     Pulses: Normal pulses.     Comments: Pedal pulses normal. Pulmonary:     Effort: Pulmonary effort is normal.  Abdominal:     General: Bowel sounds are normal. There is no distension.     Palpations: Abdomen is soft. There is no mass.  Musculoskeletal:        General: No deformity. Normal range of motion.     Cervical back: Normal range of motion and neck supple.     Lumbar back: Bony tenderness present. No swelling, edema or spasms.     Right hip: Bony tenderness present.     Comments: Tender to palpation from the right lateral posterior pelvic rim to his right greater trochanter region.  There is no edema, no palpable mass or deformity.  Full strength in lower extremities, no foot drop.  Skin:    General: Skin is warm and dry.  Neurological:     Mental Status: He is alert.     Sensory: Sensation is intact. No sensory deficit.     Motor: No tremor or atrophy.     Gait:  Gait normal.     Deep Tendon Reflexes:     Reflex Scores:      Patellar reflexes are 2+ on the right side and 2+ on the left side.   ED Results / Procedures / Treatments   Labs (all labs ordered are listed, but only abnormal results are displayed) Labs Reviewed - No data to display  EKG None  Radiology DG Lumbar Spine Complete  Result Date: 02/21/2021 CLINICAL DATA:  pain since syncope 3 days ago EXAM: LUMBAR SPINE - COMPLETE 4+ VIEW COMPARISON:  November 11, 2020 FINDINGS: There are five non-rib bearing lumbar-type vertebral bodies. There is normal alignment. There is mild anterior wedging of L1 of approximately 20%, new since prior. Intervertebral disc spaces are preserved without significant degenerative changes. Mild endplate proliferative changes throughout the lumbar spine. Atherosclerotic calcifications. IMPRESSION: New mild compression fracture deformity of L1. Recommend correlation with point tenderness. Electronically Signed   By: Meda Klinefelter M.D.   On: 02/21/2021 17:31   DG HIP UNILAT W OR W/O PELVIS 2-3 VIEWS RIGHT  Result Date: 02/21/2021 CLINICAL DATA:  pain since syncope 3 days ago EXAM: DG HIP (WITH PELVIS) 3V RIGHT COMPARISON:  November 11, 2020 FINDINGS: No acute fracture or dislocation. Joint spaces and alignment are maintained. No area of erosion or osseous destruction. No unexpected radiopaque foreign body. Vascular calcifications. Enthesopathic changes of the LEFT greater trochanter. IMPRESSION: No acute fracture or dislocation. If persistent concern for nondisplaced hip or pelvic fracture, recommend dedicated pelvic MRI. Electronically Signed   By: Meda Klinefelter M.D.  On: 02/21/2021 17:33    Procedures Procedures   Medications Ordered in ED Medications - No data to display  ED Course  I have reviewed the triage vital signs and the nursing notes.  Pertinent labs & imaging results that were available during my care of the patient were reviewed by me and  considered in my medical decision making (see chart for details).    MDM Rules/Calculators/A&P                           Imaging reviewed and discussed with patient.  He may be a candidate for kyphoplasty, he was referred to orthopedics Dr. Dallas Schimke for further evaluation of this compression fracture.  He has no neurodeficits.  He was prescribed hydrocodone for pain relief, also discussed heat therapy, activity as tolerated.  We also discussed his elevated blood pressure.  He has no headaches, chest pain, vision changes.  Labs drawn 2 days ago are reassuring.  No sign of any end organ damage at this time.  He does state that his mom died over Labor Day weekend and he has been upset about this, probably the reason for his elevated blood pressures.  He does take his metoprolol, last dose taken this morning.  Advised recheck of his blood pressure with his PCP. Final Clinical Impression(s) / ED Diagnoses Final diagnoses:  Compression fracture of L1 vertebra, initial encounter (HCC)  Primary hypertension    Rx / DC Orders ED Discharge Orders          Ordered    HYDROcodone-acetaminophen (NORCO/VICODIN) 5-325 MG tablet  Every 6 hours PRN        02/21/21 1834    HYDROcodone-acetaminophen (NORCO/VICODIN) 5-325 MG tablet  Every 6 hours PRN        02/21/21 1836             Burgess Amor, PA-C 02/21/21 Dian Situ, MD 02/23/21 1126

## 2021-02-21 NOTE — Discharge Instructions (Addendum)
As discussed you have a compression fracture of your first lumbar vertebrae.  You may benefit from a procedure called kyphoplasty.  Call Dr. Dallas Schimke Monday morning to arrange an appointment for him to review your x-rays and assist your care.  In the interim you may use the medication prescribed for pain relief.  Do not drive within 4 hours of taking hydrocodone as this medication will make you drowsy.  You may also apply heating pad as discussed to your back 20 minutes 3-4 times daily which can also be soothing.  Avoid any activity that worsens your pain.

## 2021-02-23 ENCOUNTER — Telehealth: Payer: Self-pay | Admitting: Gastroenterology

## 2021-02-23 MED FILL — Hydrocodone-Acetaminophen Tab 5-325 MG: ORAL | Qty: 6 | Status: AC

## 2021-02-24 ENCOUNTER — Telehealth: Payer: Self-pay | Admitting: Physician Assistant

## 2021-02-24 NOTE — Telephone Encounter (Signed)
Pt called reporting constipation.  He says it has been 4 or 5 days since his last BM.  Pt had dehydration last week and he was started on hydrocodone on 9/24 (in ER for vertebral fracture).  Pt says he has tried miralax and dulcolax without success.  He denies abd pain, emesis.  He says he is drinking plenty of fluids.  Discussed with pt the constipating nature of dehydration and opiods.  Recommended pt try a fleets enema.  He can continue the dulcolax or miralax as needed.  He is told to call office if no BM in the next 24 hours. He states understanding.

## 2021-02-25 ENCOUNTER — Other Ambulatory Visit: Payer: Self-pay

## 2021-02-25 ENCOUNTER — Encounter: Payer: Self-pay | Admitting: Gastroenterology

## 2021-02-25 ENCOUNTER — Encounter: Payer: Self-pay | Admitting: Physician Assistant

## 2021-02-25 ENCOUNTER — Ambulatory Visit (HOSPITAL_COMMUNITY)
Admission: RE | Admit: 2021-02-25 | Discharge: 2021-02-25 | Disposition: A | Discharge status: Home / Self Care | Payer: Self-pay | Source: Ambulatory Visit | Attending: Physician Assistant | Admitting: Physician Assistant

## 2021-02-25 ENCOUNTER — Ambulatory Visit: Payer: Self-pay | Admitting: Physician Assistant

## 2021-02-25 VITALS — BP 152/95 | HR 106 | Temp 97.4°F | Wt 151.0 lb

## 2021-02-25 DIAGNOSIS — R3989 Other symptoms and signs involving the genitourinary system: Secondary | ICD-10-CM | POA: Insufficient documentation

## 2021-02-25 DIAGNOSIS — R1030 Lower abdominal pain, unspecified: Secondary | ICD-10-CM | POA: Insufficient documentation

## 2021-02-25 DIAGNOSIS — E1165 Type 2 diabetes mellitus with hyperglycemia: Secondary | ICD-10-CM

## 2021-02-25 DIAGNOSIS — K5900 Constipation, unspecified: Secondary | ICD-10-CM

## 2021-02-25 LAB — POCT URINALYSIS DIPSTICK
Bilirubin, UA: NEGATIVE
Glucose, UA: POSITIVE — AB
Leukocytes, UA: NEGATIVE
Nitrite, UA: NEGATIVE
Protein, UA: POSITIVE — AB
Spec Grav, UA: 1.02 (ref 1.010–1.025)
Urobilinogen, UA: 0.2 E.U./dL
pH, UA: 5.5 (ref 5.0–8.0)

## 2021-02-25 LAB — GLUCOSE, POCT (MANUAL RESULT ENTRY): POC Glucose: 310 mg/dl — AB (ref 70–99)

## 2021-02-25 MED ORDER — IOHEXOL 9 MG/ML PO SOLN
ORAL | Status: AC
  Filled 2021-02-25: qty 1000

## 2021-02-25 NOTE — Progress Notes (Signed)
BP (!) 152/95   Pulse (!) 106   Temp (!) 97.4 F (36.3 C)   Wt 151 lb (68.5 kg)   SpO2 97%   BMI 22.96 kg/m    Subjective:    Patient ID: [name] [name], male    DOB: 1970-11-08, 50 y.o.   MRN: [id number]  HPI: [name] [name] is a 50 y.o. male presenting on 02/25/2021 for Constipation and urine problem   HPI  Pt had a negative covid 19 screening questionnaire.   Pt is 50yoM with uncontrolled DM who is in today complaining of constipation and problems urinating.   He says he has discomfort in the lower abdomen all the way across.    Pt says he is still straining to move his bowels.  He says he mostly just passes gas.  He used fleets enema as recommended with passed some "bbq-like material that looked shredded".   he has also used laxative and "powdered stuff"  He reports problems with straining to urinate, he will stop and go but it doesn't burn  He stopped his hydrocodone.  He still feels sometimes dizzy  He is taking the augmentin he was given in the ER on 9/22  He is only eating just a little bit due to no appetite.  He is drinking plenty of water.  He says his bs this morning was  189.  Pt was seen in office 9/22 and was sent to ER due to concerns of dehydration.  Pt was seen in ER last week on 9/21, 9/22 and 9/24.       Relevant past medical, surgical, family and social history reviewed and updated as indicated. Interim medical history since our last visit reviewed. Allergies and medications reviewed and updated.   Current Outpatient Medications:    albuterol (PROVENTIL HFA) 108 (90 Base) MCG/ACT inhaler, INHALE 2 PUFFS BY MOUTH EVERY 6 HOURS AS NEEDED FOR COUGHING, WHEEZING, OR SHORTNESS OF BREATH, Disp: 20.1 g, Rfl: 0   amoxicillin-clavulanate (AUGMENTIN) 875-125 MG tablet, Take 1 tablet by mouth every 12 (twelve) hours., Disp: 14 tablet, Rfl: 0   ARIPiprazole (ABILIFY) 10 MG tablet, TAKE 1 Tablet BY MOUTH ONCE EVERY NIGHT AT BEDTIME DOSE CHANGE, Disp: , Rfl:     ASPIRIN LOW DOSE 81 MG EC tablet, Take 81 mg by mouth every morning., Disp: , Rfl:    atorvastatin (LIPITOR) 80 MG tablet, TAKE 1 Tablet BY MOUTH ONCE EVERY DAY, Disp: 90 tablet, Rfl: 0   famotidine (PEPCID) 40 MG tablet, Take 1 tablet (40 mg total) by mouth 2 (two) times daily., Disp: 180 tablet, Rfl: 3   Fluticasone-Salmeterol (ADVAIR DISKUS IN), Inhale 1 puff into the lungs daily. Rinse mouth after use, Disp: , Rfl:    gabapentin (NEURONTIN) 100 MG capsule, Take 1 capsule (100 mg total) by mouth 3 (three) times daily., Disp: 90 capsule, Rfl: 0   glipiZIDE (GLUCOTROL XL) 5 MG 24 hr tablet, Take 1 tablet (5 mg total) by mouth daily with breakfast., Disp: 30 tablet, Rfl: 3   insulin glargine (LANTUS) 100 UNIT/ML injection, Inject 60 Units into the skin daily., Disp: , Rfl:    insulin glulisine (APIDRA) 100 UNIT/ML injection, Inject 15 Units into the skin 3 (three) times daily before meals., Disp: , Rfl:    metFORMIN (GLUCOPHAGE) 500 MG tablet, TAKE 1 Tablet  BY MOUTH TWICE DAILY WITH A MEAL, Disp: 180 tablet, Rfl: 0   metoCLOPramide (REGLAN) 5 MG tablet, Take 1 tablet (5 mg total) by mouth  4 (four) times daily -  before meals and at bedtime., Disp: 120 tablet, Rfl: 1   metoprolol tartrate (LOPRESSOR) 25 MG tablet, Take 0.5 tablets (12.5 mg total) by mouth 2 (two) times daily., Disp: 90 tablet, Rfl: 3   nitroGLYCERIN (NITROSTAT) 0.4 MG SL tablet, Place 1 tablet (0.4 mg total) under the tongue every 5 (five) minutes as needed for chest pain., Disp: 25 tablet, Rfl: 3   ondansetron (ZOFRAN ODT) 4 MG disintegrating tablet, Take 1 tablet (4 mg total) by mouth every 8 (eight) hours as needed for nausea., Disp: 10 tablet, Rfl: 0   sertraline (ZOLOFT) 100 MG tablet, Take 1 tablet by mouth daily., Disp: , Rfl:    traZODone (DESYREL) 50 MG tablet, Take 50 mg by mouth at bedtime., Disp: , Rfl:    ARIPiprazole (ABILIFY) 5 MG tablet, Take 5 mg by mouth daily. (Patient not taking: No sig reported), Disp: , Rfl:     HYDROcodone-acetaminophen (NORCO/VICODIN) 5-325 MG tablet, Take 1 tablet by mouth every 6 (six) hours as needed. (Patient not taking: Reported on 02/25/2021), Disp: 6 tablet, Rfl: 0   HYDROcodone-acetaminophen (NORCO/VICODIN) 5-325 MG tablet, Take 1 tablet by mouth every 6 (six) hours as needed for moderate pain. (Patient not taking: Reported on 02/25/2021), Disp: 15 tablet, Rfl: 0   insulin aspart protamine - aspart (NOVOLOG MIX 70/30 FLEXPEN) (70-30) 100 UNIT/ML FlexPen, Inject 40 Units into the skin 2 (two) times daily with a meal. Only with breakfast and supper when premeal glucose is above 90 (Patient not taking: No sig reported), Disp: 30 mL, Rfl: 2   polyethylene glycol (MIRALAX / GLYCOLAX) 17 g packet, Take 17 g by mouth daily. (Patient not taking: No sig reported), Disp: 14 each, Rfl: 0   promethazine (PHENERGAN) 25 MG suppository, Place 1 suppository (25 mg total) rectally every 8 (eight) hours as needed for refractory nausea / vomiting. (Patient not taking: No sig reported), Disp: 12 each, Rfl: 0     Review of Systems  Per HPI unless specifically indicated above     Objective:    BP (!) 152/95   Pulse (!) 106   Temp (!) 97.4 F (36.3 C)   Wt 151 lb (68.5 kg)   SpO2 97%   BMI 22.96 kg/m   Wt Readings from Last 3 Encounters:  02/25/21 151 lb (68.5 kg)  02/19/21 151 lb 2 oz (68.5 kg)  02/19/21 151 lb (68.5 kg)    Orthostatic Vitals for the past 48 hrs (Last 6 readings):  Patient Position Orthostatic BP Orthostatic Pulse  02/25/21 1126 Supine (!) 142/95 96  02/25/21 1127 Sitting 129/90 100  02/25/21 1128 Standing 101/87 105      Physical Exam Exam conducted with a chaperone present.  Constitutional:      General: He is not in acute distress.    Appearance: He is not toxic-appearing.  HENT:     Head: Normocephalic and atraumatic.  Cardiovascular:     Rate and Rhythm: Normal rate and regular rhythm.  Pulmonary:     Effort: Pulmonary effort is normal. No respiratory  distress.     Breath sounds: Normal breath sounds. No stridor. No wheezing or rhonchi.  Abdominal:     General: Bowel sounds are normal.     Palpations: Abdomen is soft. There is no mass.     Tenderness: There is no abdominal tenderness. There is no guarding or rebound.  Genitourinary:    Rectum: Normal.     Comments: No stool burden on  exam Musculoskeletal:     Right lower leg: No edema.     Left lower leg: No edema.  Neurological:     Mental Status: He is alert and oriented to person, place, and time.  Psychiatric:        Attention and Perception: Attention normal.        Speech: Speech normal.        Behavior: Behavior normal. Behavior is cooperative.    Results for orders placed or performed in visit on 02/25/21  POCT Urinalysis Dipstick  Result Value Ref Range   Color, UA yellow    Clarity, UA     Glucose, UA Positive (A) Negative   Bilirubin, UA negative    Ketones, UA 15mg     Spec Grav, UA 1.020 1.010 - 1.025   Blood, UA small    pH, UA 5.5 5.0 - 8.0   Protein, UA Positive (A) Negative   Urobilinogen, UA 0.2 0.2 or 1.0 E.U./dL   Nitrite, UA negative    Leukocytes, UA Negative Negative   Appearance     Odor        Blood sugar 310    Assessment & Plan:    Encounter Diagnoses  Name Primary?   Lower abdominal pain Yes   Constipation, unspecified constipation type    Uncontrolled type 2 diabetes mellitus with hyperglycemia (HCC)    Urinary problem     -pt exam is not consistent with constipation.  Due to pt with multiple recent ER and office visits, will send for CT abd/pelvis.  Pt says his cafa is active.  He is in agreement with plan.

## 2021-03-10 ENCOUNTER — Other Ambulatory Visit: Payer: Self-pay | Admitting: Physician Assistant

## 2021-03-10 MED ORDER — GABAPENTIN 100 MG PO CAPS: 100.0000 mg | ORAL_CAPSULE | Freq: Three times a day (TID) | ORAL | 0 refills | Status: DC | PRN

## 2021-03-12 ENCOUNTER — Telehealth: Payer: Self-pay

## 2021-03-12 NOTE — Telephone Encounter (Signed)
Spoke with representative from [facility] [facility], confirmed pt was approved until 03/07/22 & was informed pt medication to arrive at clinic in 10-14 business days.

## 2021-03-17 ENCOUNTER — Other Ambulatory Visit: Payer: Self-pay

## 2021-03-17 ENCOUNTER — Other Ambulatory Visit (HOSPITAL_COMMUNITY)
Admission: RE | Admit: 2021-03-17 | Discharge: 2021-03-17 | Disposition: A | Discharge status: Home / Self Care | Payer: Self-pay | Source: Ambulatory Visit | Attending: Physician Assistant | Admitting: Physician Assistant

## 2021-03-17 ENCOUNTER — Ambulatory Visit: Payer: Self-pay | Admitting: Physician Assistant

## 2021-03-17 ENCOUNTER — Encounter: Payer: Self-pay | Admitting: Physician Assistant

## 2021-03-17 VITALS — BP 145/99 | HR 100 | Temp 97.8°F | Wt 149.0 lb

## 2021-03-17 DIAGNOSIS — S32000S Wedge compression fracture of unspecified lumbar vertebra, sequela: Secondary | ICD-10-CM

## 2021-03-17 DIAGNOSIS — E1165 Type 2 diabetes mellitus with hyperglycemia: Secondary | ICD-10-CM

## 2021-03-17 DIAGNOSIS — N189 Chronic kidney disease, unspecified: Secondary | ICD-10-CM

## 2021-03-17 DIAGNOSIS — R030 Elevated blood-pressure reading, without diagnosis of hypertension: Secondary | ICD-10-CM

## 2021-03-17 LAB — BASIC METABOLIC PANEL
Anion gap: 10 (ref 5–15)
BUN: 16 mg/dL (ref 6–20)
CO2: 25 mmol/L (ref 22–32)
Calcium: 8.6 mg/dL — ABNORMAL LOW (ref 8.9–10.3)
Chloride: 99 mmol/L (ref 98–111)
Creatinine, Ser: 1.15 mg/dL (ref 0.61–1.24)
GFR, Estimated: >60 mL/min (ref >60)
Glucose, Bld: 310 mg/dL — ABNORMAL HIGH (ref 70–99)
Potassium: 4.2 mmol/L (ref 3.5–5.1)
Sodium: 134 mmol/L — ABNORMAL LOW (ref 135–145)

## 2021-03-17 NOTE — Progress Notes (Signed)
BP (!) 145/99   Pulse 100   Temp 97.8 F (36.6 C)   Wt 149 lb (67.6 kg)   SpO2 98%   BMI 22.66 kg/m    Subjective:    Patient ID: [name] [name], male    DOB: 21-Jun-1970, 50 y.o.   MRN: [id number]  HPI: [name] [name] is a 50 y.o. male presenting on 03/17/2021 for Follow-up   HPI   Chief Complaint  Patient presents with   Follow-up    Pt says he is doing better than he was when seen here 9/28.    He is eating.  He reports Not much appetite.  He is moving his bowels.  He Still with some dizziness when he gets up  He says he drinks lots of water  He reports BS 289 this morning.  He has no new complaints today.   There has been delays with getting pt's new insulin requested by his endocrinologist due to complex patient assistance application.     Relevant past medical, surgical, family and social history reviewed and updated as indicated. Interim medical history since our last visit reviewed. Allergies and medications reviewed and updated.   Current Outpatient Medications:    albuterol (PROVENTIL HFA) 108 (90 Base) MCG/ACT inhaler, INHALE 2 PUFFS BY MOUTH EVERY 6 HOURS AS NEEDED FOR COUGHING, WHEEZING, OR SHORTNESS OF BREATH, Disp: 20.1 g, Rfl: 0   ARIPiprazole (ABILIFY) 10 MG tablet, TAKE 1 Tablet BY MOUTH ONCE EVERY NIGHT AT BEDTIME DOSE CHANGE, Disp: , Rfl:    ASPIRIN LOW DOSE 81 MG EC tablet, Take 81 mg by mouth every morning., Disp: , Rfl:    atorvastatin (LIPITOR) 80 MG tablet, TAKE 1 Tablet BY MOUTH ONCE EVERY DAY, Disp: 90 tablet, Rfl: 0   famotidine (PEPCID) 40 MG tablet, Take 1 tablet (40 mg total) by mouth 2 (two) times daily., Disp: 180 tablet, Rfl: 3   Fluticasone-Salmeterol (ADVAIR DISKUS IN), Inhale 1 puff into the lungs daily. Rinse mouth after use, Disp: , Rfl:    gabapentin (NEURONTIN) 100 MG capsule, Take 1 capsule (100 mg total) by mouth 3 (three) times daily as needed., Disp: 90 capsule, Rfl: 0   glipiZIDE (GLUCOTROL XL) 5 MG 24 hr tablet,  Take 1 tablet (5 mg total) by mouth daily with breakfast., Disp: 30 tablet, Rfl: 3   insulin glargine (LANTUS) 100 UNIT/ML injection, Inject 60 Units into the skin daily., Disp: , Rfl:    insulin glulisine (APIDRA) 100 UNIT/ML injection, Inject 15 Units into the skin 3 (three) times daily before meals., Disp: , Rfl:    metFORMIN (GLUCOPHAGE) 500 MG tablet, TAKE 1 Tablet  BY MOUTH TWICE DAILY WITH A MEAL, Disp: 180 tablet, Rfl: 0   metoprolol tartrate (LOPRESSOR) 25 MG tablet, Take 0.5 tablets (12.5 mg total) by mouth 2 (two) times daily., Disp: 90 tablet, Rfl: 3   nitroGLYCERIN (NITROSTAT) 0.4 MG SL tablet, Place 1 tablet (0.4 mg total) under the tongue every 5 (five) minutes as needed for chest pain., Disp: 25 tablet, Rfl: 3   ondansetron (ZOFRAN ODT) 4 MG disintegrating tablet, Take 1 tablet (4 mg total) by mouth every 8 (eight) hours as needed for nausea., Disp: 10 tablet, Rfl: 0   polyethylene glycol (MIRALAX / GLYCOLAX) 17 g packet, Take 17 g by mouth daily., Disp: 14 each, Rfl: 0   promethazine (PHENERGAN) 25 MG suppository, Place 1 suppository (25 mg total) rectally every 8 (eight) hours as needed for refractory nausea / vomiting., Disp:  12 each, Rfl: 0   sertraline (ZOLOFT) 100 MG tablet, Take 1 tablet by mouth daily., Disp: , Rfl:    insulin aspart protamine - aspart (NOVOLOG MIX 70/30 FLEXPEN) (70-30) 100 UNIT/ML FlexPen, Inject 40 Units into the skin 2 (two) times daily with a meal. Only with breakfast and supper when premeal glucose is above 90 (Patient not taking: No sig reported), Disp: 30 mL, Rfl: 2   metoCLOPramide (REGLAN) 5 MG tablet, Take 1 tablet (5 mg total) by mouth 4 (four) times daily -  before meals and at bedtime. (Patient not taking: Reported on 03/17/2021), Disp: 120 tablet, Rfl: 1   traZODone (DESYREL) 50 MG tablet, Take 50 mg by mouth at bedtime. (Patient not taking: Reported on 03/17/2021), Disp: , Rfl:     Review of Systems  Per HPI unless specifically indicated  above     Objective:    BP (!) 145/99   Pulse 100   Temp 97.8 F (36.6 C)   Wt 149 lb (67.6 kg)   SpO2 98%   BMI 22.66 kg/m   Wt Readings from Last 3 Encounters:  03/17/21 149 lb (67.6 kg)  02/25/21 151 lb (68.5 kg)  02/19/21 151 lb 2 oz (68.5 kg)    Physical Exam Vitals reviewed.  Constitutional:      General: He is not in acute distress.    Appearance: He is well-developed. He is not toxic-appearing.  HENT:     Head: Normocephalic and atraumatic.  Cardiovascular:     Rate and Rhythm: Normal rate and regular rhythm.  Pulmonary:     Effort: Pulmonary effort is normal.     Breath sounds: Normal breath sounds. No wheezing.  Abdominal:     General: Bowel sounds are normal.     Palpations: Abdomen is soft.     Tenderness: There is no abdominal tenderness.  Musculoskeletal:     Cervical back: Neck supple.     Right lower leg: No edema.     Left lower leg: No edema.  Lymphadenopathy:     Cervical: No cervical adenopathy.  Skin:    General: Skin is warm and dry.  Neurological:     Mental Status: He is alert and oriented to person, place, and time.  Psychiatric:        Behavior: Behavior normal.           Assessment & Plan:    Encounter Diagnoses  Name Primary?   Chronic kidney disease, unspecified CKD stage Yes   Uncontrolled type 2 diabetes mellitus with hyperglycemia (HCC)    Elevated blood pressure reading    Compression fracture of lumbar vertebra, sequela     -Pt with recent Compression fracture of - L1.  Will refer for Dexa -He got covid booster recently -pt's new insulin should arrive this week or next.  He is counseled to contact endocrinology for follow up after that time -pt urged to drink plenty of fluids  -check bmp today.  Renal function impaired at last check -pt to follow up 4 wk.  He is to contact office sooner prn

## 2021-03-25 ENCOUNTER — Ambulatory Visit (HOSPITAL_COMMUNITY)
Admission: RE | Admit: 2021-03-25 | Discharge: 2021-03-25 | Disposition: A | Discharge status: Home / Self Care | Payer: Self-pay | Source: Ambulatory Visit | Attending: Physician Assistant | Admitting: Physician Assistant

## 2021-03-25 ENCOUNTER — Other Ambulatory Visit: Payer: Self-pay

## 2021-03-25 DIAGNOSIS — S32000S Wedge compression fracture of unspecified lumbar vertebra, sequela: Secondary | ICD-10-CM | POA: Insufficient documentation

## 2021-03-31 ENCOUNTER — Telehealth: Payer: Self-pay

## 2021-03-31 ENCOUNTER — Telehealth: Payer: Self-pay | Admitting: Licensed Clinical Social Worker

## 2021-03-31 NOTE — Telephone Encounter (Signed)
called

## 2021-03-31 NOTE — Telephone Encounter (Signed)
[facility] attempted phone call to patient to inquire if they were interested in counseling services, no answer, [facility] left voicemail. 

## 2021-03-31 NOTE — Telephone Encounter (Signed)
Spoke with pt to notify of insulin & pen needles delivered to clinic, advised pt to let Endocrinology know he now has insulin.

## 2021-04-13 ENCOUNTER — Ambulatory Visit: Payer: Self-pay | Admitting: Physician Assistant

## 2021-04-13 ENCOUNTER — Encounter: Payer: Self-pay | Admitting: Physician Assistant

## 2021-04-13 VITALS — BP 118/79 | HR 98 | Temp 97.3°F | Wt 147.0 lb

## 2021-04-13 DIAGNOSIS — E785 Hyperlipidemia, unspecified: Secondary | ICD-10-CM

## 2021-04-13 DIAGNOSIS — I2510 Atherosclerotic heart disease of native coronary artery without angina pectoris: Secondary | ICD-10-CM

## 2021-04-13 DIAGNOSIS — F39 Unspecified mood [affective] disorder: Secondary | ICD-10-CM

## 2021-04-13 DIAGNOSIS — M8580 Other specified disorders of bone density and structure, unspecified site: Secondary | ICD-10-CM

## 2021-04-13 DIAGNOSIS — F17200 Nicotine dependence, unspecified, uncomplicated: Secondary | ICD-10-CM

## 2021-04-13 DIAGNOSIS — K2270 Barrett's esophagus without dysplasia: Secondary | ICD-10-CM

## 2021-04-13 DIAGNOSIS — N189 Chronic kidney disease, unspecified: Secondary | ICD-10-CM

## 2021-04-13 DIAGNOSIS — S32000S Wedge compression fracture of unspecified lumbar vertebra, sequela: Secondary | ICD-10-CM

## 2021-04-13 DIAGNOSIS — E1165 Type 2 diabetes mellitus with hyperglycemia: Secondary | ICD-10-CM

## 2021-04-13 NOTE — Progress Notes (Signed)
BP 118/79   Pulse 98   Temp (!) 97.3 F (36.3 C)   Wt 147 lb (66.7 kg)   SpO2 96%   BMI 22.35 kg/m    Subjective:    Patient ID: [name] [name], male    DOB: 26-Nov-1970, 50 y.o.   MRN: [id number]  HPI: [name] [name] is a 50 y.o. male presenting on 04/13/2021 for Follow-up   HPI    Chief Complaint  Patient presents with   Follow-up      Pt is 89yoM with uncontrolled diabetes, CKD, dyslipidemia, CAD, barretts esophagus and recent problems with dizziness presents for follow up.    Pt is feeling okay.  He feels about the same as last time he was here.  He has not contacted endocrinologist for follow up as instructed (when his new insulin came in).  FBS 165 this monring  He says he is eating.  He gets along with his Brother [name] [name].  He says he has difficulty getting on with his brother [name] [name].   He says he is still trying to take care of a lot of the things related to the recent death of his mother (estate things).    Pt sustained a compression fracture seen on xray 02/21/21.    Relevant past medical, surgical, family and social history reviewed and updated as indicated. Interim medical history since our last visit reviewed. Allergies and medications reviewed and updated.   Current Outpatient Medications:    albuterol (PROVENTIL HFA) 108 (90 Base) MCG/ACT inhaler, INHALE 2 PUFFS BY MOUTH EVERY 6 HOURS AS NEEDED FOR COUGHING, WHEEZING, OR SHORTNESS OF BREATH, Disp: 20.1 g, Rfl: 0   ARIPiprazole (ABILIFY) 10 MG tablet, TAKE 1 Tablet BY MOUTH ONCE EVERY NIGHT AT BEDTIME DOSE CHANGE, Disp: , Rfl:    ASPIRIN LOW DOSE 81 MG EC tablet, Take 81 mg by mouth every morning., Disp: , Rfl:    atorvastatin (LIPITOR) 80 MG tablet, TAKE 1 Tablet BY MOUTH ONCE EVERY DAY, Disp: 90 tablet, Rfl: 0   famotidine (PEPCID) 40 MG tablet, Take 1 tablet (40 mg total) by mouth 2 (two) times daily., Disp: 180 tablet, Rfl: 3   Fluticasone-Salmeterol (ADVAIR DISKUS IN), Inhale 1 puff into the lungs  daily. Rinse mouth after use, Disp: , Rfl:    gabapentin (NEURONTIN) 100 MG capsule, Take 1 capsule (100 mg total) by mouth 3 (three) times daily as needed., Disp: 90 capsule, Rfl: 0   insulin aspart protamine - aspart (NOVOLOG MIX 70/30 FLEXPEN) (70-30) 100 UNIT/ML FlexPen, Inject 40 Units into the skin 2 (two) times daily with a meal. Only with breakfast and supper when premeal glucose is above 90, Disp: 30 mL, Rfl: 2   metFORMIN (GLUCOPHAGE) 500 MG tablet, TAKE 1 Tablet  BY MOUTH TWICE DAILY WITH A MEAL, Disp: 180 tablet, Rfl: 0   metoprolol tartrate (LOPRESSOR) 25 MG tablet, Take 0.5 tablets (12.5 mg total) by mouth 2 (two) times daily., Disp: 90 tablet, Rfl: 3   ondansetron (ZOFRAN ODT) 4 MG disintegrating tablet, Take 1 tablet (4 mg total) by mouth every 8 (eight) hours as needed for nausea., Disp: 10 tablet, Rfl: 0   sertraline (ZOLOFT) 100 MG tablet, Take 1 tablet by mouth daily., Disp: , Rfl:    glipiZIDE (GLUCOTROL XL) 5 MG 24 hr tablet, Take 1 tablet (5 mg total) by mouth daily with breakfast., Disp: 30 tablet, Rfl: 3   metoCLOPramide (REGLAN) 5 MG tablet, Take 1 tablet (5 mg total) by mouth 4 (  four) times daily -  before meals and at bedtime. (Patient not taking: No sig reported), Disp: 120 tablet, Rfl: 1   nitroGLYCERIN (NITROSTAT) 0.4 MG SL tablet, Place 1 tablet (0.4 mg total) under the tongue every 5 (five) minutes as needed for chest pain. (Patient not taking: Reported on 04/13/2021), Disp: 25 tablet, Rfl: 3   polyethylene glycol (MIRALAX / GLYCOLAX) 17 g packet, Take 17 g by mouth daily. (Patient not taking: Reported on 04/13/2021), Disp: 14 each, Rfl: 0   promethazine (PHENERGAN) 25 MG suppository, Place 1 suppository (25 mg total) rectally every 8 (eight) hours as needed for refractory nausea / vomiting. (Patient not taking: Reported on 04/13/2021), Disp: 12 each, Rfl: 0   traZODone (DESYREL) 50 MG tablet, Take 50 mg by mouth at bedtime. (Patient not taking: No sig reported), Disp: ,  Rfl:     Review of Systems  Per HPI unless specifically indicated above     Objective:    BP 118/79   Pulse 98   Temp (!) 97.3 F (36.3 C)   Wt 147 lb (66.7 kg)   SpO2 96%   BMI 22.35 kg/m   Wt Readings from Last 3 Encounters:  04/13/21 147 lb (66.7 kg)  03/17/21 149 lb (67.6 kg)  02/25/21 151 lb (68.5 kg)    Physical Exam Vitals reviewed.  Constitutional:      General: He is not in acute distress.    Appearance: He is well-developed. He is not toxic-appearing.  HENT:     Head: Normocephalic and atraumatic.  Cardiovascular:     Rate and Rhythm: Normal rate and regular rhythm.  Pulmonary:     Effort: Pulmonary effort is normal.     Breath sounds: Normal breath sounds. No wheezing.  Abdominal:     General: Bowel sounds are normal.     Palpations: Abdomen is soft.     Tenderness: There is no abdominal tenderness.  Musculoskeletal:     Cervical back: Neck supple.     Right lower leg: No edema.     Left lower leg: No edema.  Lymphadenopathy:     Cervical: No cervical adenopathy.  Skin:    General: Skin is warm and dry.  Neurological:     Mental Status: He is alert and oriented to person, place, and time.  Psychiatric:        Behavior: Behavior normal.          Assessment & Plan:    Encounter Diagnoses  Name Primary?   Uncontrolled type 2 diabetes mellitus with hyperglycemia (HCC) Yes   Chronic kidney disease, unspecified CKD stage    Osteopenia, unspecified location    Compression fracture of lumbar vertebra, sequela    Tobacco use disorder    Hyperlipidemia, unspecified hyperlipidemia type    Coronary artery disease involving native coronary artery of native heart without angina pectoris    Barrett's esophagus without dysplasia    Mood disorder (HCC)      -Will set pt up to see [facility]-  pt wasnts the counselor to call him again  -Reviewed DEXA- recommended smoking cessation and calcium supplementaiton  -Pt is urged to contact endocrinology to  follow up on his diabetes.    -He needs to reschedule his EGD (that was scheduled to be done in September but was cancelled due to pt was sick)  -pt is encouraged to eat healthy foods and to eat regularly.    -pt to follow up 2 months.  He is to contact  office sooner for any changes or issues.  Will update labs in January

## 2021-04-13 NOTE — Patient Instructions (Addendum)
Endocrinologist -   [phone]  -------------------------------------  Osteopenia Osteopenia is a loss of thickness (density) inside the bones. Another name for osteopenia is low bone mass. Mild osteopenia is a normal part of aging. It is not a disease, and it does not cause symptoms. However, if you have osteopenia and continue to lose bone mass, you could develop a condition that causes the bones to become thin and break more easily (osteoporosis). Osteoporosis can cause you to lose some height, have back pain, and have a stooped posture. Although osteopenia is not a disease, making changes to your lifestyle and diet can help to prevent osteopenia from developing into osteoporosis. What are the causes? Osteopenia is caused by loss of calcium in the bones. Bones are constantly changing. Old bone cells are continually being replaced with new bone cells. This process builds new bone. The mineral calcium is needed to build new bone and maintain bone density. Bone density is usually highest around age 12. After that, most people's bodies cannot replace all the bone they have lost with new bone. What increases the risk? You are more likely to develop this condition if: You are older than age 20. You are a woman who went through menopause early. You have a long illness that keeps you in bed. You do not get enough exercise. You lack certain nutrients (malnutrition). You have an overactive thyroid gland (hyperthyroidism). You use products that contain nicotine or tobacco, such as cigarettes, e-cigarettes and chewing tobacco, or you drink a lot of alcohol. You are taking medicines that weaken the bones, such as steroids. What are the signs or symptoms? This condition does not cause any symptoms. You may have a slightly higher risk for bone breaks (fractures), so getting fractures more easily than normal may be an indication of osteopenia. How is this diagnosed? This condition may be diagnosed  based on an X-ray exam that measures bone density (dual-energy X-ray absorptiometry, or DEXA). This test can measure bone density in your hips, spine, and wrists. Osteopenia has no symptoms, so this condition is usually diagnosed after a routine bone density screening test is done for osteoporosis. This routine screening is usually done for: Women who are age 26 or older. Men who are age 62 or older. If you have risk factors for osteopenia, you may have the screening test at an earlier age. How is this treated? Making dietary and lifestyle changes can lower your risk for osteoporosis. If you have severe osteopenia that is close to becoming osteoporosis, this condition can be treated with medicines and dietary supplements such as calcium and vitamin D. These supplements help to rebuild bone density. Follow these instructions at home: Eating and drinking Eat a diet that is high in calcium and vitamin D. Calcium is found in dairy products, beans, salmon, and leafy green vegetables like spinach and broccoli. Look for foods that have vitamin D and calcium added to them (fortified foods), such as orange juice, cereal, and bread.  Lifestyle Do 30 minutes or more of a weight-bearing exercise every day, such as walking, jogging, or playing a sport. These types of exercises strengthen the bones. Do not use any products that contain nicotine or tobacco, such as cigarettes, e-cigarettes, and chewing tobacco. If you need help quitting, ask your health care provider. Do not drink alcohol if: Your health care provider tells you not to drink. You are pregnant, may be pregnant, or are planning to become pregnant. If you drink alcohol: Limit how much you use  to: 0-1 drink a day for women. 0-2 drinks a day for men. Be aware of how much alcohol is in your drink. In the [place] one drink equals one 12 oz bottle of beer (355 mL), one 5 oz glass of wine (148 mL), or one 1 oz glass of hard liquor (44 mL). General  instructions Take over-the-counter and prescription medicines only as told by your health care provider. These include vitamins and supplements. Take precautions at home to lower your risk of falling, such as: Keeping rooms well-lit and free of clutter, such as cords. Installing safety rails on stairs. Using rubber mats in the bathroom or other areas that are often wet or slippery. Keep all follow-up visits. This is important. Contact a health care provider if: You have not had a bone density screening for osteoporosis and you are: A woman who is age 10 or older. A man who is age 39 or older. You are a postmenopausal woman who has not had a bone density screening for osteoporosis. You are older than age 55 and you want to know if you should have bone density screening for osteoporosis. Summary Osteopenia is a loss of thickness (density) inside the bones. Another name for osteopenia is low bone mass. Osteopenia is not a disease, but it may increase your risk for a condition that causes the bones to become thin and break more easily (osteoporosis). You may be at risk for osteopenia if you are older than age 60 or if you are a woman who went through early menopause. Osteopenia does not cause any symptoms, but it can be diagnosed with a bone density screening test. Dietary and lifestyle changes are the first treatment for osteopenia. These may lower your risk for osteoporosis. This information is not intended to replace advice given to you by your health care provider. Make sure you discuss any questions you have with your health care provider. Document Revised: 11/01/2019 Document Reviewed: 11/01/2019 Elsevier Patient Education  2022 [employer].

## 2021-04-14 ENCOUNTER — Telehealth: Payer: Self-pay | Admitting: Licensed Clinical Social Worker

## 2021-04-14 NOTE — Telephone Encounter (Signed)
[facility] reached patient and first counseling appointment was scheduled for 9 am on 04/21/21.

## 2021-04-21 ENCOUNTER — Ambulatory Visit: Payer: Self-pay | Admitting: Licensed Clinical Social Worker

## 2021-04-21 ENCOUNTER — Emergency Department (HOSPITAL_COMMUNITY): Payer: Self-pay

## 2021-04-21 ENCOUNTER — Telehealth: Payer: Self-pay | Admitting: Licensed Clinical Social Worker

## 2021-04-21 ENCOUNTER — Emergency Department (HOSPITAL_COMMUNITY)
Admission: EM | Admit: 2021-04-21 | Discharge: 2021-04-22 | Disposition: A | Discharge status: Home / Self Care | Payer: Self-pay | Attending: Emergency Medicine | Admitting: Emergency Medicine

## 2021-04-21 ENCOUNTER — Other Ambulatory Visit: Payer: Self-pay

## 2021-04-21 DIAGNOSIS — J449 Chronic obstructive pulmonary disease, unspecified: Secondary | ICD-10-CM | POA: Insufficient documentation

## 2021-04-21 DIAGNOSIS — J45909 Unspecified asthma, uncomplicated: Secondary | ICD-10-CM | POA: Insufficient documentation

## 2021-04-21 DIAGNOSIS — Z20822 Contact with and (suspected) exposure to covid-19: Secondary | ICD-10-CM | POA: Insufficient documentation

## 2021-04-21 DIAGNOSIS — I10 Essential (primary) hypertension: Secondary | ICD-10-CM | POA: Insufficient documentation

## 2021-04-21 DIAGNOSIS — Z79899 Other long term (current) drug therapy: Secondary | ICD-10-CM | POA: Insufficient documentation

## 2021-04-21 DIAGNOSIS — Z7951 Long term (current) use of inhaled steroids: Secondary | ICD-10-CM | POA: Insufficient documentation

## 2021-04-21 DIAGNOSIS — R739 Hyperglycemia, unspecified: Secondary | ICD-10-CM

## 2021-04-21 DIAGNOSIS — Z7984 Long term (current) use of oral hypoglycemic drugs: Secondary | ICD-10-CM | POA: Insufficient documentation

## 2021-04-21 DIAGNOSIS — F17210 Nicotine dependence, cigarettes, uncomplicated: Secondary | ICD-10-CM | POA: Insufficient documentation

## 2021-04-21 DIAGNOSIS — Z7982 Long term (current) use of aspirin: Secondary | ICD-10-CM | POA: Insufficient documentation

## 2021-04-21 DIAGNOSIS — D72829 Elevated white blood cell count, unspecified: Secondary | ICD-10-CM | POA: Insufficient documentation

## 2021-04-21 DIAGNOSIS — R112 Nausea with vomiting, unspecified: Secondary | ICD-10-CM

## 2021-04-21 DIAGNOSIS — R000 Tachycardia, unspecified: Secondary | ICD-10-CM | POA: Insufficient documentation

## 2021-04-21 DIAGNOSIS — E1165 Type 2 diabetes mellitus with hyperglycemia: Secondary | ICD-10-CM | POA: Insufficient documentation

## 2021-04-21 DIAGNOSIS — E1110 Type 2 diabetes mellitus with ketoacidosis without coma: Secondary | ICD-10-CM | POA: Insufficient documentation

## 2021-04-21 DIAGNOSIS — Z794 Long term (current) use of insulin: Secondary | ICD-10-CM | POA: Insufficient documentation

## 2021-04-21 DIAGNOSIS — I25118 Atherosclerotic heart disease of native coronary artery with other forms of angina pectoris: Secondary | ICD-10-CM | POA: Insufficient documentation

## 2021-04-21 LAB — URINALYSIS, ROUTINE W REFLEX MICROSCOPIC
Bilirubin Urine: NEGATIVE
Glucose, UA: >=500 mg/dL — AB
Ketones, ur: 5 mg/dL — AB
Leukocytes,Ua: NEGATIVE
Nitrite: NEGATIVE
Protein, ur: 100 mg/dL — AB
Specific Gravity, Urine: 1.028 (ref 1.005–1.030)
pH: 6 (ref 5.0–8.0)

## 2021-04-21 LAB — CBC WITH DIFFERENTIAL/PLATELET
Abs Immature Granulocytes: 0.07 10*3/uL (ref 0.00–0.07)
Basophils Absolute: 0.1 10*3/uL (ref 0.0–0.1)
Basophils Relative: 1 %
Eosinophils Absolute: 0.1 10*3/uL (ref 0.0–0.5)
Eosinophils Relative: 1 %
HCT: 48.5 % (ref 39.0–52.0)
Hemoglobin: 16.7 g/dL (ref 13.0–17.0)
Immature Granulocytes: 0 %
Lymphocytes Relative: 11 %
Lymphs Abs: 2 10*3/uL (ref 0.7–4.0)
MCH: 31 pg (ref 26.0–34.0)
MCHC: 34.4 g/dL (ref 30.0–36.0)
MCV: 90 fL (ref 80.0–100.0)
Monocytes Absolute: 0.8 10*3/uL (ref 0.1–1.0)
Monocytes Relative: 4 %
Neutro Abs: 14.8 10*3/uL — ABNORMAL HIGH (ref 1.7–7.7)
Neutrophils Relative %: 83 %
Platelets: 300 10*3/uL (ref 150–400)
RBC: 5.39 MIL/uL (ref 4.22–5.81)
RDW: 12.3 % (ref 11.5–15.5)
WBC: 17.8 10*3/uL — ABNORMAL HIGH (ref 4.0–10.5)
nRBC: 0 % (ref 0.0–0.2)

## 2021-04-21 LAB — COMPREHENSIVE METABOLIC PANEL
ALT: 15 U/L (ref 0–44)
AST: 17 U/L (ref 15–41)
Albumin: 3.5 g/dL (ref 3.5–5.0)
Alkaline Phosphatase: 110 U/L (ref 38–126)
Anion gap: 15 (ref 5–15)
BUN: 16 mg/dL (ref 6–20)
CO2: 25 mmol/L (ref 22–32)
Calcium: 9.1 mg/dL (ref 8.9–10.3)
Chloride: 93 mmol/L — ABNORMAL LOW (ref 98–111)
Creatinine, Ser: 1.46 mg/dL — ABNORMAL HIGH (ref 0.61–1.24)
GFR, Estimated: 58 mL/min — ABNORMAL LOW (ref >60)
Glucose, Bld: 426 mg/dL — ABNORMAL HIGH (ref 70–99)
Potassium: 4.7 mmol/L (ref 3.5–5.1)
Sodium: 133 mmol/L — ABNORMAL LOW (ref 135–145)
Total Bilirubin: 1.6 mg/dL — ABNORMAL HIGH (ref 0.3–1.2)
Total Protein: 7.1 g/dL (ref 6.5–8.1)

## 2021-04-21 LAB — LIPASE, BLOOD: Lipase: 25 U/L (ref 11–51)

## 2021-04-21 MED ORDER — PANTOPRAZOLE SODIUM 40 MG IV SOLR
40.0000 mg | Freq: Once | INTRAVENOUS | Status: AC
  Administered 2021-04-21: 40 mg via INTRAVENOUS
  Filled 2021-04-21: qty 40

## 2021-04-21 MED ORDER — INSULIN ASPART 100 UNIT/ML IJ SOLN
10.0000 [IU] | Freq: Once | INTRAMUSCULAR | Status: AC
  Administered 2021-04-21: 10 [IU] via SUBCUTANEOUS

## 2021-04-21 MED ORDER — SODIUM CHLORIDE 0.9 % IV BOLUS
2000.0000 mL | Freq: Once | INTRAVENOUS | Status: AC
  Administered 2021-04-21: 2000 mL via INTRAVENOUS

## 2021-04-21 MED ORDER — ONDANSETRON 4 MG PO TBDP
4.0000 mg | ORAL_TABLET | Freq: Once | ORAL | Status: AC
  Administered 2021-04-21: 4 mg via ORAL
  Filled 2021-04-21: qty 1

## 2021-04-21 MED ORDER — METOCLOPRAMIDE HCL 5 MG/ML IJ SOLN
10.0000 mg | Freq: Once | INTRAMUSCULAR | Status: AC
  Administered 2021-04-21: 10 mg via INTRAVENOUS
  Filled 2021-04-21: qty 2

## 2021-04-21 NOTE — Telephone Encounter (Signed)
[facility] attempted to reach patient to reschedule patient's missed appointment on current date, voicemail was left.

## 2021-04-21 NOTE — ED Provider Notes (Signed)
Emergency Medicine Provider Triage Evaluation Note  [name] [name] , a 50 y.o. male  was evaluated in triage.  Pt complains of nausea and vomiting.  Patient states that he woke up this morning with indigestion and reflux symptoms of burning in his epigastric region.  He then states he began having nausea and vomiting.  He states he has been vomiting all day and feels like he is dehydrated.  Endorses lightheadedness.  He also states he has had difficulty with his urinary stream.  Denies dysuria or hematuria.  He denies chest pain, shortness of breath, diarrhea.  Review of Systems  Positive: See above Negative:   Physical Exam  BP 119/82 (BP Location: Left Arm)   Pulse (!) 132   Resp 20   Ht 5\' 8"  (1.727 m)   Wt 66 kg   SpO2 100%   BMI 22.12 kg/m  Gen:   Awake, no distress   Resp:  Normal effort  MSK:   Moves extremities without difficulty  Other:  Abdomen is soft, tender to palpation diffusely.  Bowel sounds present.  Medical Decision Making  Medically screening exam initiated at 8:51 PM.  Appropriate orders placed.  Koran L Pine was informed that the remainder of the evaluation will be completed by another provider, this initial triage assessment does not replace that evaluation, and the importance of remaining in the ED until their evaluation is complete.     , PA-C 04/21/21 2053    2054, MD 04/21/21 2103

## 2021-04-21 NOTE — ED Notes (Signed)
Pt has instance of emesis. Small amounts. Becomes tachycardic- EKG obtained.   Pt medicated and transported to XR.

## 2021-04-21 NOTE — ED Provider Notes (Signed)
[facility] EMERGENCY DEPARTMENT Provider Note   CSN: [id number] Arrival date & time: 04/21/21  2042     History Chief Complaint  Patient presents with   Emesis    [name] [name] is a 50 y.o. male.  The history is provided by the patient and medical records. No language interpreter was used.  Emesis   50 year old male with hx of GERD, DM, CAD, COPD, HTN who presents c/o nausea.  Patient reported he was awoke this morning with burning epigastric pain radiates towards the fall esophagus.  He also endorsed persistent nausea, vomiting and now dry heaves.  He endorsed vomiting up coffee-ground emesis have a trace of blood.  He rates discomfort  5 out of 10, burning sensation.  He admits he having been taking his diabetes medication for the past several days.  He is a smoker but denies alcohol use.  He denies fever chills chest pain shortness of breath constipation diarrhea.      Past Medical History:  Diagnosis Date   Anxiety    Arthritis    Asthma    CAD (coronary artery disease)    Moderate LAD disease 2016 - Dr. Jacinto Halim   Chronic bronchitis [name])    Chronic upper back pain    COPD (chronic obstructive pulmonary disease) (HCC)    Depression    Diabetic peripheral neuropathy (HCC)    GERD (gastroesophageal reflux disease)    History of gout    Hyperlipemia    Hypertension    Migraine    Noncompliance    Ringing in the ears, bilateral    Sleep apnea 2016   "could not afford CPAP".   Type 2 diabetes mellitus (HCC)     Patient Active Problem List   Diagnosis Date Noted   Scrotal abscess 11/19/2020   Severe sepsis (HCC) 09/03/2020   Personal history of noncompliance with medical treatment, presenting hazards to health 05/27/2020   Barrett's esophagus 01/23/2019   Diarrhea 01/23/2019   MDD (major depressive disorder), severe (HCC) 12/17/2018   DKA, type 2 (HCC) 11/07/2018   High anion gap metabolic acidosis 03/22/2018   Epigastric abdominal pain  03/22/2018   Nausea vomiting and diarrhea 03/22/2018   AKI (acute kidney injury) (HCC) 03/22/2018   Uncontrolled type 2 diabetes mellitus with hyperglycemia (HCC) 03/22/2018   Diabetic gastroparesis (HCC) 06/02/2017   ARF (acute renal failure) (HCC) 05/30/2017   Mixed hyperlipidemia 03/31/2017   Diabetic polyneuropathy (HCC) 03/31/2017   Vitamin B12 deficiency 03/31/2017   Vitamin D deficiency 03/31/2017   Right bundle branch block 02/14/2017   DKA (diabetic ketoacidoses) 10/15/2016   Gastroesophageal reflux disease 10/15/2016   Benign hypertension 10/15/2016   Leukocytosis 11/25/2015   Intractable nausea and vomiting 11/25/2015   Type 2 diabetes mellitus with hyperglycemia, with long-term current use of insulin (HCC) 11/25/2015   Dental abscess 11/25/2015   Nausea and vomiting 11/25/2015   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) 05/02/2015   Coronary arteriosclerosis 03/28/2015   Chest pain 03/27/2015   Anxiety disorder 02/27/2015   Current smoker 02/27/2015   Obesity 02/27/2015   Obstructive sleep apnea 02/27/2015    Past Surgical History:  Procedure Laterality Date   ANKLE SURGERY Right 1982   "had extra bones in there; took them out"   APPENDECTOMY  1975   BIOPSY  12/26/2018   Procedure: BIOPSY;  Surgeon: Malissa Hippo, MD;  Location: [facility];  Service: Endoscopy;;  duodenal biopsies   BIOPSY  03/23/2019   Procedure: BIOPSY;  Surgeon: Malissa Hippo, MD;  Location: [facility];  Service: Endoscopy;;  esophagus   CARDIAC CATHETERIZATION N/A 03/28/2015   Procedure: Left Heart Cath and Coronary Angiography;  Surgeon: Yates Decamp, MD;  Location: [facility] INVASIVE CV LAB;  Service: Cardiovascular;  Laterality: N/A;   CARDIAC CATHETERIZATION N/A 03/28/2015   Procedure: Intravascular Pressure Wire/FFR Study;  Surgeon: Yates Decamp, MD;  Location: [facility] INVASIVE CV LAB;  Service: Cardiovascular;  Laterality: N/A;   CARPAL TUNNEL RELEASE Left ~  2008   COLONOSCOPY WITH ESOPHAGOGASTRODUODENOSCOPY (EGD)     COLONOSCOPY WITH PROPOFOL N/A 12/24/2019   Procedure: COLONOSCOPY WITH PROPOFOL;  Surgeon: Malissa Hippo, MD;  Location: [facility];  Service: Endoscopy;  Laterality: N/A;  955   ELBOW FRACTURE SURGERY Left ~ 2008   ESOPHAGOGASTRODUODENOSCOPY N/A 12/26/2018   Procedure: ESOPHAGOGASTRODUODENOSCOPY (EGD);  Surgeon: Malissa Hippo, MD;  Location: [facility];  Service: Endoscopy;  Laterality: N/A;   ESOPHAGOGASTRODUODENOSCOPY (EGD) WITH PROPOFOL N/A 03/23/2019   Procedure: ESOPHAGOGASTRODUODENOSCOPY (EGD) WITH PROPOFOL;  Surgeon: Malissa Hippo, MD;  Location: [facility];  Service: Endoscopy;  Laterality: N/A;  7:30   FRACTURE SURGERY     ankle and elbow   PILONIDAL CYST EXCISION N/A 03/24/2017   Procedure: EXCISION CHRONIC  PILONIDAL ABSCESS;  Surgeon: [name] Miyamoto, MD;  Location: [facility] OR[facility];  Service: General;  Laterality: N/A;   SCROTAL EXPLORATION N/A 11/13/2020   Procedure: EXCISION OF SEBACEOUS CYSTS, SCROTUM;  Surgeon: Malen Gauze, MD;  Location: [facility];  Service: Urology;  Laterality: N/A;   TENDON REPAIR Left ~ 2004   "main tendon in my ankle"       Family History  Problem Relation Age of Onset   Diabetes Mother    Hypertension Mother    Cancer Mother    Throat cancer Mother    Diabetes Father    Hypertension Father    Hyperlipidemia Father    Congestive Heart Failure Sister    Colon cancer Neg Hx    Pancreatic cancer Neg Hx    Liver disease Neg Hx     Social History   Tobacco Use   Smoking status: Every Day    Packs/day: 0.50    Years: 34.00    Pack years: 17.00    Types: Cigarettes   Smokeless tobacco: Never   Tobacco comments:    1/2 pack a day  Vaping Use   Vaping Use: Never used  Substance Use Topics   Alcohol use: Never    Alcohol/week: 0.0 standard drinks   Drug use: Never    Home Medications Prior to Admission medications   Medication Sig Start Date End Date Taking?  Authorizing Provider  albuterol (PROVENTIL HFA) 108 (90 Base) MCG/ACT inhaler INHALE 2 PUFFS BY MOUTH EVERY 6 HOURS AS NEEDED FOR COUGHING, WHEEZING, OR SHORTNESS OF BREATH 12/29/20   [name], PA-C  ARIPiprazole (ABILIFY) 10 MG tablet TAKE 1 Tablet BY MOUTH ONCE EVERY NIGHT AT BEDTIME DOSE CHANGE 11/10/20   [provider]  ASPIRIN LOW DOSE 81 MG EC tablet Take 81 mg by mouth every morning. 09/05/20   [provider]  atorvastatin (LIPITOR) 80 MG tablet TAKE 1 Tablet BY MOUTH ONCE EVERY DAY 12/29/20   [name], PA-C  famotidine (PEPCID) 40 MG tablet Take 1 tablet (40 mg total) by mouth 2 (two) times daily. 10/16/20   Dolores Frame, MD  Fluticasone-Salmeterol (ADVAIR DISKUS IN) Inhale 1 puff into the lungs  daily. Rinse mouth after use    [provider]  gabapentin (NEURONTIN) 100 MG capsule Take 1 capsule (100 mg total) by mouth 3 (three) times daily as needed. 03/10/21   [name], PA-C  glipiZIDE (GLUCOTROL XL) 5 MG 24 hr tablet Take 1 tablet (5 mg total) by mouth daily with breakfast. 11/10/20   Nida, Denman George, MD  insulin aspart protamine - aspart (NOVOLOG MIX 70/30 FLEXPEN) (70-30) 100 UNIT/ML FlexPen Inject 40 Units into the skin 2 (two) times daily with a meal. Only with breakfast and supper when premeal glucose is above 90 01/14/21   Nida, Denman George, MD  metFORMIN (GLUCOPHAGE) 500 MG tablet TAKE 1 Tablet  BY MOUTH TWICE DAILY WITH A MEAL 02/12/21   Nida, Denman George, MD  metoCLOPramide (REGLAN) 5 MG tablet Take 1 tablet (5 mg total) by mouth 4 (four) times daily -  before meals and at bedtime. Patient not taking: No sig reported 11/22/19   Tawni Pummel B, PA-C  metoprolol tartrate (LOPRESSOR) 25 MG tablet Take 0.5 tablets (12.5 mg total) by mouth 2 (two) times daily. 02/16/21   Dyann Kief, PA-C  nitroGLYCERIN (NITROSTAT) 0.4 MG SL tablet Place 1 tablet (0.4 mg total) under the tongue every 5 (five) minutes as needed for  chest pain. Patient not taking: Reported on 04/13/2021 10/05/17   [name], PA-C  ondansetron (ZOFRAN ODT) 4 MG disintegrating tablet Take 1 tablet (4 mg total) by mouth every 8 (eight) hours as needed for nausea. 02/19/21   Eber Hong, MD  polyethylene glycol (MIRALAX / GLYCOLAX) 17 g packet Take 17 g by mouth daily. Patient not taking: Reported on 04/13/2021 11/11/20   Gilda Crease, MD  promethazine (PHENERGAN) 25 MG suppository Place 1 suppository (25 mg total) rectally every 8 (eight) hours as needed for refractory nausea / vomiting. Patient not taking: Reported on 04/13/2021 12/20/18   Vassie Loll, MD  sertraline (ZOLOFT) 100 MG tablet Take 1 tablet by mouth daily. 01/09/20   [provider]  traZODone (DESYREL) 50 MG tablet Take 50 mg by mouth at bedtime. Patient not taking: No sig reported 10/03/20   [provider]    Allergies    Omeprazole magnesium, Bee venom, Esomeprazole, and Morphine and related  Review of Systems   Review of Systems  Gastrointestinal:  Positive for vomiting.  All other systems reviewed and are negative.  Physical Exam Updated Vital Signs BP (!) 126/92   Pulse (!) 118   Temp 97.8 F (36.6 C) (Temporal)   Resp 18   Ht 5\' 8"  (1.727 m)   Wt 66 kg   SpO2 98%   BMI 22.12 kg/m   Physical Exam Vitals and nursing note reviewed.  Constitutional:      Appearance: He is well-developed.     Comments: Patient sitting upright appears uncomfortable, dry heaving  HENT:     Head: Atraumatic.  Eyes:     Conjunctiva/sclera: Conjunctivae normal.  Cardiovascular:     Rate and Rhythm: Tachycardia present.     Pulses: Normal pulses.     Heart sounds: Normal heart sounds.  Pulmonary:     Effort: Pulmonary effort is normal.     Breath sounds: Normal breath sounds.  Abdominal:     Palpations: Abdomen is soft.     Tenderness: There is abdominal tenderness (No epigastric tenderness no guarding rebound tenderness.).   Musculoskeletal:        General: Normal range of motion.     Cervical  back: Neck supple.  Skin:    Findings: No rash.  Neurological:     Mental Status: He is alert. Mental status is at baseline.    ED Results / Procedures / Treatments   Labs (all labs ordered are listed, but only abnormal results are displayed) Labs Reviewed  URINALYSIS, ROUTINE W REFLEX MICROSCOPIC - Abnormal; Notable for the following components:      Result Value   APPearance HAZY (*)    Glucose, UA >=500 (*)    Hgb urine dipstick SMALL (*)    Ketones, ur 5 (*)    Protein, ur 100 (*)    Bacteria, UA RARE (*)    All other components within normal limits  COMPREHENSIVE METABOLIC PANEL - Abnormal; Notable for the following components:   Sodium 133 (*)    Chloride 93 (*)    Glucose, Bld 426 (*)    Creatinine, Ser 1.46 (*)    Total Bilirubin 1.6 (*)    GFR, Estimated 58 (*)    All other components within normal limits  CBC WITH DIFFERENTIAL/PLATELET - Abnormal; Notable for the following components:   WBC 17.8 (*)    Neutro Abs 14.8 (*)    All other components within normal limits  RESP PANEL BY RT-PCR (FLU A&B, COVID) ARPGX2  LIPASE, BLOOD  POC OCCULT BLOOD, ED    EKG EKG Interpretation  Date/Time:  Tuesday April 21 2021 22:04:59 EST Ventricular Rate:  127 PR Interval:  112 QRS Duration: 128 QT Interval:  338 QTC Calculation: 492 R Axis:   231 Text Interpretation: Sinus tachycardia Right bundle branch block No significant change since last tracing Confirmed by Susy Frizzle [phone]) on 04/21/2021 10:48:34 PM  Radiology DG Abdomen Acute W/Chest  Result Date: 04/21/2021 CLINICAL DATA:  Nausea vomiting EXAM: DG ABDOMEN ACUTE WITH 1 VIEW CHEST COMPARISON:  12/25/2018, CT 02/25/2021 FINDINGS: Single-view chest demonstrates small focus of atelectasis or minor infiltrate at the right base. Normal cardiomediastinal silhouette. Supine and upright views of the abdomen demonstrate no free air beneath  the diaphragm. Nonobstructed gas pattern with moderate stool. No radiopaque calculi IMPRESSION: 1. Nonobstructed gas pattern. 2. Small focus of atelectasis or mild infiltrate at the right base Electronically Signed   By: Jasmine Pang M.D.   On: 04/21/2021 22:34    Procedures Procedures   Medications Ordered in ED Medications  ondansetron (ZOFRAN-ODT) disintegrating tablet 4 mg (4 mg Oral Given 04/21/21 2057)    ED Course  I have reviewed the triage vital signs and the nursing notes.  Pertinent labs & imaging results that were available during my care of the patient were reviewed by me and considered in my medical decision making (see chart for details).    MDM Rules/Calculators/A&P                           BP (!) 159/94   Pulse (!) 120   Temp 97.8 F (36.6 C) (Temporal)   Resp (!) 22   Ht 5\' 8"  (1.727 m)   Wt 66 kg   SpO2 93%   BMI 22.12 kg/m   Final Clinical Impression(s) / ED Diagnoses Final diagnoses:  None    Rx / DC Orders ED Discharge Orders     None      10:11 PM Patient here with complaints of epigastric discomfort burning sensation nausea and vomiting and now coffee-ground emesis.  Does have significant history of GERD as well as history of gastroparesis  secondary to poorly controlled diabetes.  He is dry heaving, appears uncomfortable.  will provide symptom control, obtain an acute abdominal series and monitor closely.  Patient is hyperglycemic with a CBG of 426, normal anion gap.  Has evidence of elevated CBG previously.  This is likely secondary to medication noncompliant.  White count 17.8, similar to prior.  Normal lipase.  Urine without signs of urine tract infection.  An acute abdominal series obtained showing nonobstructive gas pattern and a small focus of atelectasis or mild infiltrate at the right base.  He is without fever or productive cough to suggest pneumonia.  After receiving symptomatic treatment, patient appears much more comfortable.  I  have low suspicion for perforated peptic ulcer, Boerhaave or other acute emergent abdominal pathology.  11:40 PM Pt sign out to oncoming team who will reassess pt after IV hydration and insulin and determine disposition.     Fayrene Helper, PA-C 04/21/21 2342    Pollyann Savoy, MD 04/22/21 [phone]

## 2021-04-21 NOTE — ED Triage Notes (Signed)
Pt c/o nausea and vomiting. Pt states he thinks he may be dehydrated.

## 2021-04-21 NOTE — ED Notes (Signed)
Pt reports 'heart burn' feeling in throat x24 hrs. Took OTC antacids earlier today and vomited after. Nausea continues.

## 2021-04-22 LAB — RESP PANEL BY RT-PCR (FLU A&B, COVID) ARPGX2
Influenza A by PCR: NEGATIVE
Influenza B by PCR: NEGATIVE
SARS Coronavirus 2 by RT PCR: NEGATIVE

## 2021-04-22 LAB — CBG MONITORING, ED: Glucose-Capillary: 188 mg/dL — ABNORMAL HIGH (ref 70–99)

## 2021-04-22 LAB — POTASSIUM: Potassium: 4 mmol/L (ref 3.5–5.1)

## 2021-04-22 MED ORDER — ONDANSETRON 4 MG PO TBDP: 4.0000 mg | ORAL_TABLET | Freq: Three times a day (TID) | ORAL | 0 refills | Status: DC | PRN

## 2021-04-22 NOTE — ED Provider Notes (Signed)
Patient here with nausea, vomiting, and hyperglycemia.  Signed out to me at shift change.  Patient getting fluids and awaiting reassessment.  Patient had a few ketones in his urine, and given his hyperglycemia and persistent vomiting, will check VBG.  Patient does have history of DKA.  Patient is not acidotic.  On my reassessment, his heart rate has improved some, his is no longer vomiting.  He states that he feels well enough to go home.  I do not think that he requires any further emergent work-up or monitoring in the ED at this time.  Will discharge home with Zofran.  Return precautions discussed.   Roxy Horseman, PA-C 04/22/21 0245    Maia Plan, MD 04/22/21 [phone]

## 2021-04-22 NOTE — ED Notes (Signed)
Pt resting. Free of N/V. No complaints at this time.

## 2021-04-22 NOTE — ED Notes (Signed)
Critical lab K 7.8 - Rob PA made aware. EKG obtained and labs redrawn.

## 2021-04-22 NOTE — ED Notes (Signed)
Pt verbalized understanding of d/c instructions, meds, and followup care. Denies questions. VSS, no distress noted. Pt calls brother for ride. Assisted into wheelchair.

## 2021-04-28 LAB — I-STAT VENOUS BLOOD GAS, ED
Acid-Base Excess: 4 mmol/L — ABNORMAL HIGH (ref 0.0–2.0)
Bicarbonate: 27.1 mmol/L (ref 20.0–28.0)
Calcium, Ion: 0.94 mmol/L — ABNORMAL LOW (ref 1.15–1.40)
HCT: 44 % (ref 39.0–52.0)
Hemoglobin: 15 g/dL (ref 13.0–17.0)
O2 Saturation: 97 %
Potassium: 7.8 mmol/L (ref 3.5–5.1)
Sodium: 133 mmol/L — ABNORMAL LOW (ref 135–145)
TCO2: 28 mmol/L (ref 22–32)
pCO2, Ven: 35.3 mmHg — ABNORMAL LOW (ref 44.0–60.0)
pH, Ven: 7.493 — ABNORMAL HIGH (ref 7.250–7.430)
pO2, Ven: 87 mmHg — ABNORMAL HIGH (ref 32.0–45.0)

## 2021-05-20 ENCOUNTER — Emergency Department (HOSPITAL_COMMUNITY)
Admission: EM | Admit: 2021-05-20 | Discharge: 2021-05-20 | Disposition: A | Discharge status: Home / Self Care | Payer: Self-pay | Attending: Emergency Medicine | Admitting: Emergency Medicine

## 2021-05-20 ENCOUNTER — Encounter (HOSPITAL_COMMUNITY): Payer: Self-pay

## 2021-05-20 ENCOUNTER — Other Ambulatory Visit: Payer: Self-pay

## 2021-05-20 DIAGNOSIS — J45909 Unspecified asthma, uncomplicated: Secondary | ICD-10-CM | POA: Insufficient documentation

## 2021-05-20 DIAGNOSIS — I2510 Atherosclerotic heart disease of native coronary artery without angina pectoris: Secondary | ICD-10-CM | POA: Insufficient documentation

## 2021-05-20 DIAGNOSIS — F17210 Nicotine dependence, cigarettes, uncomplicated: Secondary | ICD-10-CM | POA: Insufficient documentation

## 2021-05-20 DIAGNOSIS — Z7984 Long term (current) use of oral hypoglycemic drugs: Secondary | ICD-10-CM | POA: Insufficient documentation

## 2021-05-20 DIAGNOSIS — I10 Essential (primary) hypertension: Secondary | ICD-10-CM | POA: Insufficient documentation

## 2021-05-20 DIAGNOSIS — E860 Dehydration: Secondary | ICD-10-CM | POA: Insufficient documentation

## 2021-05-20 DIAGNOSIS — J449 Chronic obstructive pulmonary disease, unspecified: Secondary | ICD-10-CM | POA: Insufficient documentation

## 2021-05-20 DIAGNOSIS — E1140 Type 2 diabetes mellitus with diabetic neuropathy, unspecified: Secondary | ICD-10-CM | POA: Insufficient documentation

## 2021-05-20 DIAGNOSIS — Z794 Long term (current) use of insulin: Secondary | ICD-10-CM | POA: Insufficient documentation

## 2021-05-20 DIAGNOSIS — E1165 Type 2 diabetes mellitus with hyperglycemia: Secondary | ICD-10-CM | POA: Insufficient documentation

## 2021-05-20 DIAGNOSIS — Z7982 Long term (current) use of aspirin: Secondary | ICD-10-CM | POA: Insufficient documentation

## 2021-05-20 DIAGNOSIS — Z79899 Other long term (current) drug therapy: Secondary | ICD-10-CM | POA: Insufficient documentation

## 2021-05-20 LAB — COMPREHENSIVE METABOLIC PANEL
ALT: 12 U/L (ref 0–44)
AST: 11 U/L — ABNORMAL LOW (ref 15–41)
Albumin: 3.4 g/dL — ABNORMAL LOW (ref 3.5–5.0)
Alkaline Phosphatase: 93 U/L (ref 38–126)
Anion gap: 12 (ref 5–15)
BUN: 33 mg/dL — ABNORMAL HIGH (ref 6–20)
CO2: 20 mmol/L — ABNORMAL LOW (ref 22–32)
Calcium: 8.4 mg/dL — ABNORMAL LOW (ref 8.9–10.3)
Chloride: 100 mmol/L (ref 98–111)
Creatinine, Ser: 1.42 mg/dL — ABNORMAL HIGH (ref 0.61–1.24)
GFR, Estimated: 60 mL/min — ABNORMAL LOW (ref >60)
Glucose, Bld: 295 mg/dL — ABNORMAL HIGH (ref 70–99)
Potassium: 5.5 mmol/L — ABNORMAL HIGH (ref 3.5–5.1)
Sodium: 132 mmol/L — ABNORMAL LOW (ref 135–145)
Total Bilirubin: 1 mg/dL (ref 0.3–1.2)
Total Protein: 6.4 g/dL — ABNORMAL LOW (ref 6.5–8.1)

## 2021-05-20 LAB — CBC WITH DIFFERENTIAL/PLATELET
Abs Immature Granulocytes: 0.03 10*3/uL (ref 0.00–0.07)
Basophils Absolute: 0.1 10*3/uL (ref 0.0–0.1)
Basophils Relative: 1 %
Eosinophils Absolute: 0.2 10*3/uL (ref 0.0–0.5)
Eosinophils Relative: 1 %
HCT: 44.5 % (ref 39.0–52.0)
Hemoglobin: 15.2 g/dL (ref 13.0–17.0)
Immature Granulocytes: 0 %
Lymphocytes Relative: 26 %
Lymphs Abs: 3.1 10*3/uL (ref 0.7–4.0)
MCH: 31.9 pg (ref 26.0–34.0)
MCHC: 34.2 g/dL (ref 30.0–36.0)
MCV: 93.3 fL (ref 80.0–100.0)
Monocytes Absolute: 0.6 10*3/uL (ref 0.1–1.0)
Monocytes Relative: 5 %
Neutro Abs: 7.9 10*3/uL — ABNORMAL HIGH (ref 1.7–7.7)
Neutrophils Relative %: 67 %
Platelets: 258 10*3/uL (ref 150–400)
RBC: 4.77 MIL/uL (ref 4.22–5.81)
RDW: 12.2 % (ref 11.5–15.5)
WBC: 11.8 10*3/uL — ABNORMAL HIGH (ref 4.0–10.5)
nRBC: 0 % (ref 0.0–0.2)

## 2021-05-20 LAB — CBG MONITORING, ED: Glucose-Capillary: 289 mg/dL — ABNORMAL HIGH (ref 70–99)

## 2021-05-20 MED ORDER — FAMOTIDINE 20 MG PO TABS: 20.0000 mg | ORAL_TABLET | Freq: Two times a day (BID) | ORAL | 0 refills | Status: DC

## 2021-05-20 MED ORDER — SODIUM CHLORIDE 0.9 % IV BOLUS
1000.0000 mL | Freq: Once | INTRAVENOUS | Status: AC
  Administered 2021-05-20: 14:00:00 1000 mL via INTRAVENOUS

## 2021-05-20 MED ORDER — FAMOTIDINE 20 MG PO TABS
20.0000 mg | ORAL_TABLET | Freq: Once | ORAL | Status: AC
  Administered 2021-05-20: 14:00:00 20 mg via ORAL
  Filled 2021-05-20: qty 1

## 2021-05-20 NOTE — ED Triage Notes (Signed)
Patient brought in via ems from [facility]. Patient was being seen as normal patient, but not feeling well for the past week. Per [facility], initial BP was 70/40. EM gave 1L fluid to bring BP up responsively. Patient A&Ox4 VSS now. Patient states he has been nauseous due to being out of GERD meds due to not being able to afford

## 2021-05-20 NOTE — Discharge Instructions (Addendum)
As discussed, today's evaluation has demonstrated dehydration.  It is very important you stay well-hydrated, and monitor your condition carefully.  Do not hesitate to return here for concerning changes.  You have been provided a new prescription for Pepcid.  This medicine should be available at a more reasonable price than you had previously obtained.  Please use the paper prescription and go to [facility] or [facility] for assistance.

## 2021-05-20 NOTE — ED Provider Notes (Signed)
[facility] EMERGENCY DEPARTMENT Provider Note   CSN: [id number] Arrival date & time: 05/20/21  1317     History Chief Complaint  Patient presents with   Hypotension    [name] [name] is a 50 y.o. male.  HPI Patient presents from local mental health facility with staff concern of hypotension.  Patient states that he feels fine, denies complaints, beyond generalized ill sensation for about 1 week.  Patient knowledges multiple medical issues, states that he has been taking most of his medication as directed.  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  Per report patient was hypotensive 70/40 on scene, this improved with 1 L from EMS.  Patient does note that he has had weakness as above, would also nausea, secondary to running out of his Pepcid.    Past Medical History:  Diagnosis Date   Anxiety    Arthritis    Asthma    CAD (coronary artery disease)    Moderate LAD disease 2016 - Dr. Jacinto Halim   Chronic bronchitis [facility])    Chronic upper back pain    COPD (chronic obstructive pulmonary disease) (HCC)    Depression    Diabetic peripheral neuropathy (HCC)    GERD (gastroesophageal reflux disease)    History of gout    Hyperlipemia    Hypertension    Migraine    Noncompliance    Ringing in the ears, bilateral    Sleep apnea 2016   "could not afford CPAP".   Type 2 diabetes mellitus (HCC)     Patient Active Problem List   Diagnosis Date Noted   Scrotal abscess 11/19/2020   Severe sepsis (HCC) 09/03/2020   Personal history of noncompliance with medical treatment, presenting hazards to health 05/27/2020   Barrett's esophagus 01/23/2019   Diarrhea 01/23/2019   MDD (major depressive disorder), severe (HCC) 12/17/2018   DKA, type 2 (HCC) 11/07/2018   High anion gap metabolic acidosis 03/22/2018   Epigastric abdominal pain 03/22/2018   Nausea vomiting and diarrhea 03/22/2018   AKI (acute kidney injury) (HCC) 03/22/2018   Uncontrolled type 2 diabetes  mellitus with hyperglycemia (HCC) 03/22/2018   Diabetic gastroparesis (HCC) 06/02/2017   ARF (acute renal failure) (HCC) 05/30/2017   Mixed hyperlipidemia 03/31/2017   Diabetic polyneuropathy (HCC) 03/31/2017   Vitamin B12 deficiency 03/31/2017   Vitamin D deficiency 03/31/2017   Right bundle branch block 02/14/2017   DKA (diabetic ketoacidoses) 10/15/2016   Gastroesophageal reflux disease 10/15/2016   Benign hypertension 10/15/2016   Leukocytosis 11/25/2015   Intractable nausea and vomiting 11/25/2015   Type 2 diabetes mellitus with hyperglycemia, with long-term current use of insulin (HCC) 11/25/2015   Dental abscess 11/25/2015   Nausea and vomiting 11/25/2015   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) 05/02/2015   Coronary arteriosclerosis 03/28/2015   Chest pain 03/27/2015   Anxiety disorder 02/27/2015   Current smoker 02/27/2015   Obesity 02/27/2015   Obstructive sleep apnea 02/27/2015    Past Surgical History:  Procedure Laterality Date   ANKLE SURGERY Right 1982   "had extra bones in there; took them out"   APPENDECTOMY  1975   BIOPSY  12/26/2018   Procedure: BIOPSY;  Surgeon: Malissa Hippo, MD;  Location: [facility];  Service: Endoscopy;;  duodenal biopsies   BIOPSY  03/23/2019   Procedure: BIOPSY;  Surgeon: Malissa Hippo, MD;  Location: [facility];  Service: Endoscopy;;  esophagus   CARDIAC CATHETERIZATION N/A 03/28/2015  Procedure: Left Heart Cath and Coronary Angiography;  Surgeon: Yates Decamp, MD;  Location: [facility] INVASIVE CV LAB;  Service: Cardiovascular;  Laterality: N/A;   CARDIAC CATHETERIZATION N/A 03/28/2015   Procedure: Intravascular Pressure Wire/FFR Study;  Surgeon: Yates Decamp, MD;  Location: [facility] INVASIVE CV LAB;  Service: Cardiovascular;  Laterality: N/A;   CARPAL TUNNEL RELEASE Left ~ 2008   COLONOSCOPY WITH ESOPHAGOGASTRODUODENOSCOPY (EGD)     COLONOSCOPY WITH PROPOFOL N/A 12/24/2019   Procedure: COLONOSCOPY WITH  PROPOFOL;  Surgeon: Malissa Hippo, MD;  Location: [facility];  Service: Endoscopy;  Laterality: N/A;  955   ELBOW FRACTURE SURGERY Left ~ 2008   ESOPHAGOGASTRODUODENOSCOPY N/A 12/26/2018   Procedure: ESOPHAGOGASTRODUODENOSCOPY (EGD);  Surgeon: Malissa Hippo, MD;  [facility];  Service: Endoscopy;  Laterality: N/A;   ESOPHAGOGASTRODUODENOSCOPY (EGD) WITH PROPOFOL N/A 03/23/2019   Procedure: ESOPHAGOGASTRODUODENOSCOPY (EGD) WITH PROPOFOL;  Surgeon: Malissa Hippo, MD;  Location: [facility];  Service: Endoscopy;  Laterality: N/A;  7:30   FRACTURE SURGERY     ankle and elbow   PILONIDAL CYST EXCISION N/A 03/24/2017   Procedure: EXCISION CHRONIC  PILONIDAL ABSCESS;  Surgeon: [name] Miyamoto, MD;  Location: [facility];  Service: General;  Laterality: N/A;   SCROTAL EXPLORATION N/A 11/13/2020   Procedure: EXCISION OF SEBACEOUS CYSTS, SCROTUM;  Surgeon: Malen Gauze, MD;  Location: [facility];  Service: Urology;  Laterality: N/A;   TENDON REPAIR Left ~ 2004   "main tendon in my ankle"       Family History  Problem Relation Age of Onset   Diabetes Mother    Hypertension Mother    Cancer Mother    Throat cancer Mother    Diabetes Father    Hypertension Father    Hyperlipidemia Father    Congestive Heart Failure Sister    Colon cancer Neg Hx    Pancreatic cancer Neg Hx    Liver disease Neg Hx     Social History   Tobacco Use   Smoking status: Every Day    Packs/day: 0.50    Years: 34.00    Pack years: 17.00    Types: Cigarettes   Smokeless tobacco: Never   Tobacco comments:    1/2 pack a day  Vaping Use   Vaping Use: Never used  Substance Use Topics   Alcohol use: Never    Alcohol/week: 0.0 standard drinks   Drug use: Never    Home Medications Prior to Admission medications   Medication Sig Start Date End Date Taking? Authorizing Provider  famotidine (PEPCID) 20 MG tablet Take 1 tablet (20 mg total) by mouth 2 (two) times daily. Take one tablet  twice daily for two days 05/20/21  Yes Gerhard Munch, MD  albuterol (PROVENTIL HFA) 108 (90 Base) MCG/ACT inhaler INHALE 2 PUFFS BY MOUTH EVERY 6 HOURS AS NEEDED FOR COUGHING, WHEEZING, OR SHORTNESS OF BREATH 12/29/20   [name], PA-C  ARIPiprazole (ABILIFY) 10 MG tablet TAKE 1 Tablet BY MOUTH ONCE EVERY NIGHT AT BEDTIME DOSE CHANGE 11/10/20   [provider]  ASPIRIN LOW DOSE 81 MG EC tablet Take 81 mg by mouth every morning. 09/05/20   [provider]  atorvastatin (LIPITOR) 80 MG tablet TAKE 1 Tablet BY MOUTH ONCE EVERY DAY 12/29/20   [name], PA-C  famotidine (PEPCID) 40 MG tablet Take 1 tablet (40 mg total) by mouth 2 (two) times daily. 10/16/20   Dolores Frame, MD  Fluticasone-Salmeterol (ADVAIR DISKUS IN) Inhale 1 puff  into the lungs daily. Rinse mouth after use    [provider]  gabapentin (NEURONTIN) 100 MG capsule Take 1 capsule (100 mg total) by mouth 3 (three) times daily as needed. 03/10/21   [name], PA-C  glipiZIDE (GLUCOTROL XL) 5 MG 24 hr tablet Take 1 tablet (5 mg total) by mouth daily with breakfast. 11/10/20   Nida, Denman George, MD  insulin aspart protamine - aspart (NOVOLOG MIX 70/30 FLEXPEN) (70-30) 100 UNIT/ML FlexPen Inject 40 Units into the skin 2 (two) times daily with a meal. Only with breakfast and supper when premeal glucose is above 90 01/14/21   Nida, Denman George, MD  metFORMIN (GLUCOPHAGE) 500 MG tablet TAKE 1 Tablet  BY MOUTH TWICE DAILY WITH A MEAL 02/12/21   Nida, Denman George, MD  metoCLOPramide (REGLAN) 5 MG tablet Take 1 tablet (5 mg total) by mouth 4 (four) times daily -  before meals and at bedtime. Patient not taking: No sig reported 11/22/19   Tawni Pummel B, PA-C  metoprolol tartrate (LOPRESSOR) 25 MG tablet Take 0.5 tablets (12.5 mg total) by mouth 2 (two) times daily. 02/16/21   Dyann Kief, PA-C  nitroGLYCERIN (NITROSTAT) 0.4 MG SL tablet Place 1 tablet (0.4 mg total) under the  tongue every 5 (five) minutes as needed for chest pain. Patient not taking: Reported on 04/13/2021 10/05/17   [name], PA-C  ondansetron (ZOFRAN-ODT) 4 MG disintegrating tablet Take 1 tablet (4 mg total) by mouth every 8 (eight) hours as needed for nausea or vomiting. 04/22/21   Roxy Horseman, PA-C  polyethylene glycol (MIRALAX / GLYCOLAX) 17 g packet Take 17 g by mouth daily. Patient not taking: Reported on 04/13/2021 11/11/20   Gilda Crease, MD  promethazine (PHENERGAN) 25 MG suppository Place 1 suppository (25 mg total) rectally every 8 (eight) hours as needed for refractory nausea / vomiting. Patient not taking: Reported on 04/13/2021 12/20/18   Vassie Loll, MD  sertraline (ZOLOFT) 100 MG tablet Take 1 tablet by mouth daily. 01/09/20   [provider]  traZODone (DESYREL) 50 MG tablet Take 50 mg by mouth at bedtime. Patient not taking: No sig reported 10/03/20   [provider]    Allergies    Omeprazole magnesium, Bee venom, Esomeprazole, and Morphine and related  Review of Systems   Review of Systems  Constitutional:        Per HPI, otherwise negative  HENT:         Per HPI, otherwise negative  Respiratory:         Per HPI, otherwise negative  Cardiovascular:        Per HPI, otherwise negative  Gastrointestinal:  Negative for vomiting.  Endocrine:       Negative aside from HPI  Genitourinary:        Neg aside from HPI   Musculoskeletal:        Per HPI, otherwise negative  Skin: Negative.   Neurological:  Positive for weakness. Negative for syncope.   Physical Exam Updated Vital Signs BP (!) 147/101    Pulse 89    Temp 98.6 F (37 C) (Oral)    Resp 15    Ht  (1.727 m)    Wt 65.8 kg    SpO2 97%    BMI 22.05 kg/m   Physical Exam Vitals and nursing note reviewed.  Constitutional:      General: He is not in acute distress.    Appearance: He is well-developed.  HENT:  Head: Normocephalic and atraumatic.  Eyes:      Conjunctiva/sclera: Conjunctivae normal.  Cardiovascular:     Rate and Rhythm: Regular rhythm. Tachycardia present.  Pulmonary:     Effort: Pulmonary effort is normal. No respiratory distress.     Breath sounds: No stridor.  Abdominal:     General: There is no distension.  Skin:    General: Skin is warm and dry.  Neurological:     Mental Status: He is alert and oriented to person, place, and time.    ED Results / Procedures / Treatments   Labs (all labs ordered are listed, but only abnormal results are displayed) Labs Reviewed  COMPREHENSIVE METABOLIC PANEL - Abnormal; Notable for the following components:      Result Value   Sodium 132 (*)    Potassium 5.5 (*)    CO2 20 (*)    Glucose, Bld 295 (*)    BUN 33 (*)    Creatinine, Ser 1.42 (*)    Calcium 8.4 (*)    Total Protein 6.4 (*)    Albumin 3.4 (*)    AST 11 (*)    GFR, Estimated 60 (*)    All other components within normal limits  CBC WITH DIFFERENTIAL/PLATELET - Abnormal; Notable for the following components:   WBC 11.8 (*)    Neutro Abs 7.9 (*)    All other components within normal limits  CBG MONITORING, ED - Abnormal; Notable for the following components:   Glucose-Capillary 289 (*)    All other components within normal limits    EKG EKG Interpretation  Date/Time:  Wednesday May 20 2021 13:30:51 EST Ventricular Rate:  92 PR Interval:  120 QRS Duration: 132 QT Interval:  381 QTC Calculation: 472 R Axis:   254 Text Interpretation: Sinus rhythm RBBB and LAFB ST elev, probable normal early repol pattern No significant change since last tracing Abnormal ECG Confirmed by Gerhard Munch [phone]) on 05/20/2021 2:31:32 PM  Radiology No results found.  Procedures Procedures   Medications Ordered in ED Medications  sodium chloride 0.9 % bolus 1,000 mL (0 mLs Intravenous Stopped 05/20/21 1517)  famotidine (PEPCID) tablet 20 mg (20 mg Oral Given 05/20/21 1345)    ED Course  I have reviewed the triage  vital signs and the nursing notes.  Pertinent labs & imaging results that were available during my care of the patient were reviewed by me and considered in my medical decision making (see chart for details).  Cardiac 100 sinus tach or sinus rhythm, borderline Pulse ox 100% room air normal  Update: On repeat exam patient awake, alert, sitting upright, in no distress labs reviewed, discussed, consistent with prior abnormalities with slight elevation in potassium.  Patient has received 2 L fluid resuscitation, has normalization of the blood pressure, is asymptomatic.  We discussed the importance of taking her medication as directed, he was provided an additional prescription as he had some cost concerns about his Pepcid, discharged in stable condition. MDM Rules/Calculators/A&P MDM Number of Diagnoses or Management Options Dehydration: new, needed workup   Amount and/or Complexity of Data Reviewed Clinical lab tests: ordered and reviewed Tests in the medicine section of CPT: reviewed and ordered Decide to obtain previous medical records or to obtain history from someone other than the patient: yes Obtain history from someone other than the patient: yes Review and summarize past medical records: yes Independent visualization of images, tracings, or specimens: yes  Risk of Complications, Morbidity, and/or Mortality Presenting problems: high Diagnostic  procedures: high Management options: high  Critical Care Total time providing critical care: < 30 minutes  Patient Progress Patient progress: improved   Final Clinical Impression(s) / ED Diagnoses Final diagnoses:  Dehydration    Rx / DC Orders ED Discharge Orders          Ordered    famotidine (PEPCID) 20 MG tablet  2 times daily        05/20/21 1528             Gerhard Munch, MD 05/20/21 1529

## 2021-05-21 ENCOUNTER — Other Ambulatory Visit: Payer: Self-pay | Admitting: Physician Assistant

## 2021-05-26 ENCOUNTER — Ambulatory Visit: Payer: Self-pay | Admitting: Physician Assistant

## 2021-05-26 ENCOUNTER — Encounter: Payer: Self-pay | Admitting: Physician Assistant

## 2021-05-26 VITALS — BP 77/65 | HR 93 | Temp 98.2°F | Wt 146.0 lb

## 2021-05-26 DIAGNOSIS — R109 Unspecified abdominal pain: Secondary | ICD-10-CM

## 2021-05-26 DIAGNOSIS — I959 Hypotension, unspecified: Secondary | ICD-10-CM

## 2021-05-26 DIAGNOSIS — E875 Hyperkalemia: Secondary | ICD-10-CM

## 2021-05-26 DIAGNOSIS — N189 Chronic kidney disease, unspecified: Secondary | ICD-10-CM

## 2021-05-26 DIAGNOSIS — K2270 Barrett's esophagus without dysplasia: Secondary | ICD-10-CM

## 2021-05-26 DIAGNOSIS — E1165 Type 2 diabetes mellitus with hyperglycemia: Secondary | ICD-10-CM

## 2021-05-26 MED ORDER — FAMOTIDINE 40 MG PO TABS: 40.0000 mg | ORAL_TABLET | Freq: Two times a day (BID) | ORAL | 3 refills | Status: DC

## 2021-05-26 NOTE — Patient Instructions (Addendum)
Increase insulin to 45 units twice daily.  Monitor blood sugars  Call endocrinology for appointment- [phone]  Call gastroenterology for appointment  [phone]  Stop metoprolol  Drink at least 4-5 big bottles water daily

## 2021-05-26 NOTE — Progress Notes (Signed)
BP (!) 77/65    Pulse 93    Temp 98.2 F (36.8 C)    Wt 146 lb (66.2 kg)    SpO2 99%    BMI 22.20 kg/m    Subjective:    Patient ID: [name] [name], male    DOB: [birth year], 50 y.o.   MRN: [id number]  HPI: [name] [name] is a 50 y.o. male presenting on 05/26/2021 for Follow-up (Per [facility], needs to be seen for BP and bs)   HPI  Chief Complaint  Patient presents with   Follow-up    Per [facility], needs to be seen for BP and bs     Pt says his fbs 303 this am.  He says it is running in the 3's.    He has [employer] financial assistance  He hasn't seen endocrinology since 01/14/21  He says he drinks 3 or 4 big tumblers of water daily.  He is feeling light-headed when he stands  Pt seen in ER on 05/20/21 for low bp.  He was given IVF and bp returned to normal.    Labs done that day showed elevated K+ and Cr  He says he doesn't drink etoh  Pt says his stomach hurts.  He says he can't afford pepcid and [facility] doesn't have that medication.  He can't take omeprazole which is only ppi available thru [facility] and they have no H2 blockers.  The prescription for pepcid was sent for [facility] month supply to [facility].  He has had no recent changes in his abdominal pain.     Relevant past medical, surgical, family and social history reviewed and updated as indicated. Interim medical history since our last visit reviewed. Allergies and medications reviewed and updated.    Current Outpatient Medications:    ARIPiprazole (ABILIFY) 10 MG tablet, TAKE 1 Tablet BY MOUTH ONCE EVERY NIGHT AT BEDTIME DOSE CHANGE, Disp: , Rfl:    ASPIRIN LOW DOSE 81 MG EC tablet, Take 81 mg by mouth every morning., Disp: , Rfl:    atorvastatin (LIPITOR) 80 MG tablet, TAKE 1 Tablet BY MOUTH ONCE EVERY DAY, Disp: 90 tablet, Rfl: 0   Fluticasone-Salmeterol (ADVAIR DISKUS IN), Inhale 1 puff into the lungs daily. Rinse mouth after use, Disp: , Rfl:    gabapentin (NEURONTIN) 100 MG capsule, Take 1 capsule (100 mg total)  by mouth 3 (three) times daily as needed., Disp: 90 capsule, Rfl: 0   insulin aspart protamine - aspart (NOVOLOG MIX 70/30 FLEXPEN) (70-30) 100 UNIT/ML FlexPen, Inject 40 Units into the skin 2 (two) times daily with a meal. Only with breakfast and supper when premeal glucose is above 90, Disp: 30 mL, Rfl: 2   metFORMIN (GLUCOPHAGE) 500 MG tablet, TAKE 1 Tablet  BY MOUTH TWICE DAILY WITH A MEAL, Disp: 180 tablet, Rfl: 0   metoprolol tartrate (LOPRESSOR) 25 MG tablet, Take 0.5 tablets (12.5 mg total) by mouth 2 (two) times daily., Disp: 90 tablet, Rfl: 3   ondansetron (ZOFRAN-ODT) 4 MG disintegrating tablet, Take 1 tablet (4 mg total) by mouth every 8 (eight) hours as needed for nausea or vomiting., Disp: 10 tablet, Rfl: 0   sertraline (ZOLOFT) 100 MG tablet, Take 1 tablet by mouth daily., Disp: , Rfl:    traZODone (DESYREL) 50 MG tablet, Take 50 mg by mouth at bedtime., Disp: , Rfl:    albuterol (VENTOLIN HFA) 108 (90 Base) MCG/ACT inhaler, INHALE 2 PUFFS BY MOUTH EVERY 6 HOURS AS NEEDED FOR COUGHING, WHEEZING, OR SHORTNESS  OF BREATH (Patient not taking: Reported on 05/26/2021), Disp: 20.1 g, Rfl: 0   famotidine (PEPCID) 20 MG tablet, Take 1 tablet (20 mg total) by mouth 2 (two) times daily. Take one tablet twice daily for two days (Patient not taking: Reported on 05/26/2021), Disp: 30 tablet, Rfl: 0   famotidine (PEPCID) 40 MG tablet, Take 1 tablet (40 mg total) by mouth 2 (two) times daily. (Patient not taking: Reported on 05/26/2021), Disp: 180 tablet, Rfl: 3   glipiZIDE (GLUCOTROL XL) 5 MG 24 hr tablet, Take 1 tablet (5 mg total) by mouth daily with breakfast. (Patient not taking: Reported on 05/26/2021), Disp: 30 tablet, Rfl: 3   metoCLOPramide (REGLAN) 5 MG tablet, Take 1 tablet (5 mg total) by mouth 4 (four) times daily -  before meals and at bedtime. (Patient not taking: Reported on 03/17/2021), Disp: 120 tablet, Rfl: 1   nitroGLYCERIN (NITROSTAT) 0.4 MG SL tablet, Place 1 tablet (0.4 mg total)  under the tongue every 5 (five) minutes as needed for chest pain. (Patient not taking: Reported on 04/13/2021), Disp: 25 tablet, Rfl: 3   polyethylene glycol (MIRALAX / GLYCOLAX) 17 g packet, Take 17 g by mouth daily. (Patient not taking: Reported on 04/13/2021), Disp: 14 each, Rfl: 0   promethazine (PHENERGAN) 25 MG suppository, Place 1 suppository (25 mg total) rectally every 8 (eight) hours as needed for refractory nausea / vomiting. (Patient not taking: Reported on 04/13/2021), Disp: 12 each, Rfl: 0    Review of Systems  Per HPI unless specifically indicated above     Objective:    BP (!) 77/65    Pulse 93    Temp 98.2 F (36.8 C)    Wt 146 lb (66.2 kg)    SpO2 99%    BMI 22.20 kg/m   Wt Readings from Last 3 Encounters:  05/26/21 146 lb (66.2 kg)  05/20/21 145 lb (65.8 kg)  04/21/21 145 lb 8.1 oz (66 kg)      Orthostatic Vitals for the past 48 hrs (Last 6 readings):  Patient Position Orthostatic BP Orthostatic Pulse  05/26/21 1009 Supine (!) 76/55 86  05/26/21 1010 Sitting (!) 69/48 89  05/26/21 1011 Standing (!) 67/46 94     Physical Exam Vitals reviewed.  Constitutional:      General: He is not in acute distress.    Appearance: He is well-developed. He is not toxic-appearing.     Comments: disheveled  HENT:     Head: Normocephalic and atraumatic.  Cardiovascular:     Rate and Rhythm: Normal rate and regular rhythm.  Pulmonary:     Effort: Pulmonary effort is normal.     Breath sounds: Normal breath sounds. No wheezing.  Abdominal:     General: Bowel sounds are normal.     Palpations: Abdomen is soft.     Tenderness: There is no abdominal tenderness.  Musculoskeletal:     Cervical back: Neck supple.     Right lower leg: No edema.     Left lower leg: No edema.  Lymphadenopathy:     Cervical: No cervical adenopathy.  Skin:    General: Skin is warm and dry.  Neurological:     Mental Status: He is alert and oriented to person, place, and time.  Psychiatric:         Behavior: Behavior normal.           Assessment & Plan:    Encounter Diagnoses  Name Primary?   Hypotension, unspecified hypotension type Yes  Uncontrolled type 2 diabetes mellitus with hyperglycemia (HCC)    Chronic kidney disease, unspecified CKD stage    Hyperkalemia    Barrett's esophagus without dysplasia    Abdominal pain, unspecified abdominal location      Pt to Stop metoprolol due to low bp  Pt to call endocrinology tomorrow to schedule.  (Called today but they are closed for holidays)  He has barrets esophagus.  He was scheduled for egd 02/25/21.  He says it was cancelled because [employer] wouldn't pay for it.  He says he called and cancelled it but he was never scheduled for f/u     Pt to Increase his insulin 70/30 to 45 units bid.  Pt to call endocrinologist tomorrow to scheulde f/u.  He is to monitor his bs.    Pt to call GI to schedule follow up.    Pt to increase PO intake.    Pt to follow up here 06/16/21 as scheduled.  He is to contact office sooner prn

## 2021-06-02 ENCOUNTER — Telehealth: Payer: Self-pay

## 2021-06-02 NOTE — Telephone Encounter (Signed)
Attempted call to follow up with [facility] client. There was no answer. Left voicemail requesting return call.  [name] RN Clara Gunn/[facility]

## 2021-06-02 NOTE — Telephone Encounter (Signed)
Called pt left vm to call back.  Pt called back and was informed he needs to make an appt with endocrinology before we can order more insulin, pt voiced understanding.

## 2021-06-10 ENCOUNTER — Other Ambulatory Visit: Payer: Self-pay

## 2021-06-10 ENCOUNTER — Ambulatory Visit (INDEPENDENT_AMBULATORY_CARE_PROVIDER_SITE_OTHER): Payer: Self-pay | Admitting: "Endocrinology

## 2021-06-10 ENCOUNTER — Other Ambulatory Visit (HOSPITAL_COMMUNITY)
Admission: RE | Admit: 2021-06-10 | Discharge: 2021-06-10 | Disposition: A | Discharge status: Home / Self Care | Payer: Self-pay | Source: Ambulatory Visit | Attending: Physician Assistant | Admitting: Physician Assistant

## 2021-06-10 ENCOUNTER — Encounter: Payer: Self-pay | Admitting: "Endocrinology

## 2021-06-10 VITALS — BP 106/74 | HR 88 | Ht 67.0 in | Wt 145.2 lb

## 2021-06-10 DIAGNOSIS — E875 Hyperkalemia: Secondary | ICD-10-CM

## 2021-06-10 DIAGNOSIS — E1165 Type 2 diabetes mellitus with hyperglycemia: Secondary | ICD-10-CM

## 2021-06-10 DIAGNOSIS — I959 Hypotension, unspecified: Secondary | ICD-10-CM

## 2021-06-10 DIAGNOSIS — F17200 Nicotine dependence, unspecified, uncomplicated: Secondary | ICD-10-CM

## 2021-06-10 DIAGNOSIS — Z91199 Patient's noncompliance with other medical treatment and regimen due to unspecified reason: Secondary | ICD-10-CM

## 2021-06-10 DIAGNOSIS — E782 Mixed hyperlipidemia: Secondary | ICD-10-CM

## 2021-06-10 DIAGNOSIS — N189 Chronic kidney disease, unspecified: Secondary | ICD-10-CM

## 2021-06-10 LAB — POCT GLYCOSYLATED HEMOGLOBIN (HGB A1C): HbA1c, POC (controlled diabetic range): 12.5 % — AB (ref 0.0–7.0)

## 2021-06-10 LAB — BASIC METABOLIC PANEL
Anion gap: 10 (ref 5–15)
BUN: 14 mg/dL (ref 6–20)
CO2: 24 mmol/L (ref 22–32)
Calcium: 8.6 mg/dL — ABNORMAL LOW (ref 8.9–10.3)
Chloride: 99 mmol/L (ref 98–111)
Creatinine, Ser: 1.01 mg/dL (ref 0.61–1.24)
GFR, Estimated: >60 mL/min (ref >60)
Glucose, Bld: 362 mg/dL — ABNORMAL HIGH (ref 70–99)
Potassium: 4.8 mmol/L (ref 3.5–5.1)
Sodium: 133 mmol/L — ABNORMAL LOW (ref 135–145)

## 2021-06-10 MED ORDER — NOVOLOG MIX 70/30 FLEXPEN (70-30) 100 UNIT/ML ~~LOC~~ SUPN: 50.0000 [IU] | PEN_INJECTOR | Freq: Two times a day (BID) | SUBCUTANEOUS | 2 refills | Status: DC

## 2021-06-10 NOTE — Patient Instructions (Signed)

## 2021-06-10 NOTE — Progress Notes (Signed)
06/10/2021, 4:03 PM  Endocrinology follow-up note   Subjective:    Patient ID: [name] [name], male    DOB: 06-13-1970.  [name] [name] is being seen in follow-up after he was seen in consultation for management of currently uncontrolled symptomatic diabetes requested by  Soyla Dryer, PA-C.   Past Medical History:  Diagnosis Date   Anxiety    Arthritis    Asthma    CAD (coronary artery disease)    Moderate LAD disease 2016 - Dr. Einar Gip   Chronic bronchitis 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   Type 2 diabetes mellitus ([place])     Past Surgical History:  Procedure Laterality Date   ANKLE SURGERY Right 1982   "had extra bones in there; took them out"   [place]  12/26/2018   Procedure: BIOPSY;  Surgeon: Rogene Houston, MD;  [facility];  Service: Endoscopy;;  duodenal biopsies   BIOPSY  03/23/2019   Procedure: BIOPSY;  Surgeon: Rogene Houston, MD;  [facility];  Service: Endoscopy;;  esophagus   CARDIAC CATHETERIZATION N/A 03/28/2015   Procedure: Left Heart Cath and Coronary Angiography;  Surgeon: Adrian Prows, MD;  Location: [place] [facility];  Service: Cardiovascular;  Laterality: N/A;   CARDIAC CATHETERIZATION N/A 03/28/2015   Procedure: Intravascular Pressure Wire/FFR Study;  Surgeon: Adrian Prows, MD;  Location: [place] [facility];  Service: Cardiovascular;  Laterality: N/A;   CARPAL TUNNEL RELEASE Left ~ 2008   COLONOSCOPY WITH ESOPHAGOGASTRODUODENOSCOPY (EGD)     COLONOSCOPY WITH PROPOFOL N/A 12/24/2019   Procedure: COLONOSCOPY WITH  PROPOFOL;  Surgeon: Rogene Houston, MD;  [facility];  Service: Endoscopy;  Laterality: N/A;  955   ELBOW FRACTURE SURGERY Left ~ 2008   ESOPHAGOGASTRODUODENOSCOPY N/A 12/26/2018   Procedure: ESOPHAGOGASTRODUODENOSCOPY (EGD);  Surgeon: Rogene Houston, MD;  [facility];  Service: Endoscopy;  Laterality: N/A;   ESOPHAGOGASTRODUODENOSCOPY (EGD) WITH PROPOFOL N/A 03/23/2019   Procedure: ESOPHAGOGASTRODUODENOSCOPY (EGD) WITH PROPOFOL;  Surgeon: Rogene Houston, MD;  [facility];  Service: Endoscopy;  Laterality: N/A;  7:30   FRACTURE SURGERY     ankle and elbow   PILONIDAL CYST EXCISION N/A 03/24/2017   Procedure: EXCISION CHRONIC  PILONIDAL ABSCESS;  Surgeon: Coralie Keens, MD;  Location: [facility];  Service: General;  Laterality: N/A;   SCROTAL EXPLORATION N/A 11/13/2020   Procedure: EXCISION OF SEBACEOUS CYSTS, SCROTUM;  Surgeon: Cleon Gustin, MD;  Location: [facility];  Service: Urology;  Laterality: N/A;   TENDON REPAIR Left ~ 2004   "main tendon in my ankle"    Social History   Socioeconomic History   Marital status: Single  Spouse name: Not on file   Number of children: 0   Years of education: 10th g   Highest education level: Not on file  Occupational History   Occupation: [employer]  Tobacco Use   Smoking status: Every Day    Packs/day: 0.50    Years: 34.00    Pack years: 17.00    Types: Cigarettes   Smokeless tobacco: Never   Tobacco comments:    1/2 pack a day  Vaping Use   Vaping Use: Never used  Substance and Sexual Activity   Alcohol use: Never    Alcohol/week: 0.0 standard drinks   Drug use: Never   Sexual activity: Not Currently  Other Topics Concern   Not on file  Social History Narrative   His main caretaker, his mother has terminal metastatic cancer.   Social Determinants of Health   Financial Resource Strain: Not on file  Food Insecurity: Not on file  Transportation Needs: Not on file  Physical  Activity: Not on file  Stress: Not on file  Social Connections: Not on file    Family History  Problem Relation Age of Onset   Diabetes Mother    Hypertension Mother    Cancer Mother    Throat cancer Mother    Diabetes Father    Hypertension Father    Hyperlipidemia Father    Congestive Heart Failure Sister    Colon cancer Neg Hx    Pancreatic cancer Neg Hx    Liver disease Neg Hx     Outpatient Encounter Medications as of 06/10/2021  Medication Sig   albuterol (VENTOLIN HFA) 108 (90 Base) MCG/ACT inhaler INHALE 2 PUFFS BY MOUTH EVERY 6 HOURS AS NEEDED FOR COUGHING, WHEEZING, OR SHORTNESS OF BREATH (Patient not taking: Reported on 05/26/2021)   ARIPiprazole (ABILIFY) 10 MG tablet TAKE 1 Tablet BY MOUTH ONCE EVERY NIGHT AT BEDTIME DOSE CHANGE   ASPIRIN LOW DOSE 81 MG EC tablet Take 81 mg by mouth every morning.   atorvastatin (LIPITOR) 80 MG tablet TAKE 1 Tablet BY MOUTH ONCE EVERY DAY   famotidine (PEPCID) 20 MG tablet Take 1 tablet (20 mg total) by mouth 2 (two) times daily. Take one tablet twice daily for two days (Patient not taking: Reported on 05/26/2021)   famotidine (PEPCID) 40 MG tablet Take 1 tablet (40 mg total) by mouth 2 (two) times daily.   Fluticasone-Salmeterol (ADVAIR DISKUS IN) Inhale 1 puff into the lungs daily. Rinse mouth after use   gabapentin (NEURONTIN) 100 MG capsule Take 1 capsule (100 mg total) by mouth 3 (three) times daily as needed.   glipiZIDE (GLUCOTROL XL) 5 MG 24 hr tablet Take 1 tablet (5 mg total) by mouth daily with breakfast. (Patient not taking: Reported on 05/26/2021)   insulin aspart protamine - aspart (NOVOLOG MIX 70/30 FLEXPEN) (70-30) 100 UNIT/ML FlexPen Inject 50-55 Units into the skin 2 (two) times daily with a meal. Only with breakfast and supper when premeal glucose is above 90   metFORMIN (GLUCOPHAGE) 500 MG tablet TAKE 1 Tablet  BY MOUTH TWICE DAILY WITH A MEAL   metoCLOPramide (REGLAN) 5 MG tablet Take 1 tablet (5 mg total) by mouth 4  (four) times daily -  before meals and at bedtime. (Patient not taking: Reported on 03/17/2021)   metoprolol tartrate (LOPRESSOR) 25 MG tablet Take 0.5 tablets (12.5 mg total) by mouth 2 (two) times daily.   nitroGLYCERIN (NITROSTAT) 0.4 MG SL tablet Place 1 tablet (0.4 mg total) under the  tongue every 5 (five) minutes as needed for chest pain. (Patient not taking: Reported on 04/13/2021)   ondansetron (ZOFRAN-ODT) 4 MG disintegrating tablet Take 1 tablet (4 mg total) by mouth every 8 (eight) hours as needed for nausea or vomiting.   polyethylene glycol (MIRALAX / GLYCOLAX) 17 g packet Take 17 g by mouth daily. (Patient not taking: Reported on 04/13/2021)   promethazine (PHENERGAN) 25 MG suppository Place 1 suppository (25 mg total) rectally every 8 (eight) hours as needed for refractory nausea / vomiting. (Patient not taking: Reported on 04/13/2021)   sertraline (ZOLOFT) 100 MG tablet Take 1 tablet by mouth daily.   traZODone (DESYREL) 50 MG tablet Take 50 mg by mouth at bedtime.   [DISCONTINUED] insulin aspart protamine - aspart (NOVOLOG MIX 70/30 FLEXPEN) (70-30) 100 UNIT/ML FlexPen Inject 40 Units into the skin 2 (two) times daily with a meal. Only with breakfast and supper when premeal glucose is above 90   [DISCONTINUED] insulin aspart protamine - aspart (NOVOLOG MIX 70/30 FLEXPEN) (70-30) 100 UNIT/ML FlexPen Inject 50-55 Units into the skin 2 (two) times daily with a meal. Only with breakfast and supper when premeal glucose is above 90   No facility-administered encounter medications on file as of 06/10/2021.    ALLERGIES: Allergies  Allergen Reactions   Omeprazole Magnesium Swelling    Face swells, no breathing impairment   Bee Venom    Esomeprazole Swelling    Face swells, no breathing impairment   Morphine And Related     Itching at IV site, dry heaving, burning sensation in throat.    VACCINATION STATUS: Immunization History  Administered Date(s) Administered    Influenza-Unspecified 03/13/2019   Moderna Sars-Covid-2 Vaccination 09/11/2019, 10/11/2019   Pneumococcal Polysaccharide-23 05/25/2017    Diabetes He presents for his follow-up diabetic visit. He has type 2 diabetes mellitus. Onset time: He was diagnosed at approximate age of 83 years. His disease course has been worsening. There are no hypoglycemic associated symptoms. Pertinent negatives for hypoglycemia include no confusion, headaches, pallor or seizures. Associated symptoms include polydipsia and polyuria. Pertinent negatives for diabetes include no chest pain, no fatigue, no polyphagia and no weakness. There are no hypoglycemic complications. Symptoms are improving. Diabetic complications include heart disease. Risk factors for coronary artery disease include diabetes mellitus, dyslipidemia, male sex, hypertension, family history, tobacco exposure and sedentary lifestyle. Current diabetic treatment includes insulin injections. His weight is fluctuating minimally. He is following a generally unhealthy diet. When asked about meal planning, he reported none. He rarely participates in exercise. His home blood glucose trend is increasing steadily. His breakfast blood glucose range is generally >200 mg/dl. His dinner blood glucose range is generally >200 mg/dl. His overall blood glucose range is >200 mg/dl. [name] [name] presents with his logs showing slight improvement in his glycemic profile and A1c of 12.5% improving from 13.6%.  In June his A1c was greater than 15.5% .  He did not document or report any hypoglycemia.) An ACE inhibitor/angiotensin II receptor blocker is being taken. He does not see a podiatrist.Eye exam is not current.  Hyperlipidemia This is a chronic problem. The current episode started more than 1 year ago. The problem is uncontrolled. Exacerbating diseases include diabetes. Pertinent negatives include no chest pain, myalgias or shortness of breath. Risk factors for coronary artery disease  include dyslipidemia, diabetes mellitus, family history, male sex, hypertension and a sedentary lifestyle.  Hypertension This is a chronic problem. The current episode started more than 1 year ago. The problem is controlled.  Pertinent negatives include no chest pain, headaches, neck pain, palpitations or shortness of breath. Risk factors for coronary artery disease include diabetes mellitus, dyslipidemia, family history, male gender, sedentary lifestyle and smoking/tobacco exposure. Past treatments include direct vasodilators. Hypertensive end-organ damage includes CAD/MI.    Review of Systems  Constitutional:  Negative for chills, fatigue, fever and unexpected weight change.  HENT:  Negative for dental problem, mouth sores and trouble swallowing.   Eyes:  Negative for visual disturbance.  Respiratory:  Negative for cough, choking, chest tightness, shortness of breath and wheezing.   Cardiovascular:  Negative for chest pain, palpitations and leg swelling.  Gastrointestinal:  Negative for abdominal distention, abdominal pain, constipation, diarrhea, nausea and vomiting.  Endocrine: Positive for polydipsia and polyuria. Negative for polyphagia.  Genitourinary:  Negative for dysuria, flank pain, hematuria and urgency.  Musculoskeletal:  Negative for back pain, gait problem, myalgias and neck pain.  Skin:  Negative for pallor, rash and wound.  Neurological:  Negative for seizures, syncope, weakness, numbness and headaches.  Psychiatric/Behavioral:  Negative for confusion and dysphoric mood.    Objective:    Vitals with BMI 06/10/2021 05/26/2021 05/20/2021  Height 5\' 7"  - -  Weight 145 lbs 3 oz 146 lbs -  BMI [id number] -  Systolic [id number] 0000000  Diastolic 74 65 84  Pulse 88 93 90    BP 106/74    Pulse 88    Ht 5\' 7"  (1.702 m)    Wt 145 lb 3.2 oz (65.9 kg)    BMI 22.74 kg/m   Wt Readings from Last 3 Encounters:  06/10/21 145 lb 3.2 oz (65.9 kg)  05/26/21 146 lb (66.2 kg)  05/20/21 145 lb  (65.8 kg)    Reluctant affect   CMP     Component Value Date/Time   NA 133 (L) 06/10/2021 1445   K 4.8 06/10/2021 1445   CL 99 06/10/2021 1445   CO2 24 06/10/2021 1445   GLUCOSE 362 (H) 06/10/2021 1445   BUN 14 06/10/2021 1445   CREATININE 1.01 06/10/2021 1445   CREATININE 1.05 01/23/2019 0927   CALCIUM 8.6 (L) 06/10/2021 1445   PROT 6.4 (L) 05/20/2021 1341   ALBUMIN 3.4 (L) 05/20/2021 1341   AST 11 (L) 05/20/2021 1341   ALT 12 05/20/2021 1341   ALKPHOS 93 05/20/2021 1341   BILITOT 1.0 05/20/2021 1341   GFRNONAA >60 06/10/2021 1445   GFRNONAA 83 01/23/2019 0927   GFRAA >60 01/11/2020 0859   GFRAA 96 01/23/2019 0927     Diabetic Labs (most recent): Lab Results  Component Value Date   HGBA1C 12.5 (A) 06/10/2021   HGBA1C 13.6 (H) 11/11/2020   HGBA1C >15.5 (H) 09/03/2020     Lipid Panel ( most recent) Lipid Panel     Component Value Date/Time   CHOL 118 02/03/2021 1022   TRIG 213 (H) 02/03/2021 1022   HDL 26 (L) 02/03/2021 1022   CHOLHDL 4.5 02/03/2021 1022   VLDL 43 (H) 02/03/2021 1022   LDLCALC 49 02/03/2021 1022   LDLDIRECT 181.3 (H) 11/03/2020 1139      Lab Results  Component Value Date   TSH 1.772 05/24/2017   TSH 4.778 (H) 11/25/2015     Assessment & Plan:   1. Uncontrolled type 2 diabetes mellitus with hyperglycemia ([place])   - [name] [name] has currently uncontrolled symptomatic type 2 DM since  51 years of age. Recent labs reviewed.  [name] [name] presents with his logs showing slight improvement in his glycemic  profile and A1c of 12.5% improving from 13.6%.  In June his A1c was greater than 15.5% .  He did not document or report any hypoglycemia.   - I had a long discussion with him about the progressive nature of diabetes and the pathology behind its complications. -his diabetes is complicated by coronary artery disease and he remains at a high risk for more acute and chronic complications which include CAD, CVA, CKD, retinopathy, and neuropathy.  These are all discussed in detail with him.  - I have counseled him on diet  and weight management  by adopting a carbohydrate restricted/protein rich diet. Patient is encouraged to switch to  unprocessed or minimally processed     complex starch and increased protein intake (animal or plant source), fruits, and vegetables. -  he is advised to stick to a routine mealtimes to eat 3 meals  a day and avoid unnecessary snacks ( to snack only to correct hypoglycemia).   - he acknowledges that there is a room for improvement in his food and drink choices. - Suggestion is made for him to avoid simple carbohydrates  from his diet including Cakes, Sweet Desserts, Ice Cream, Soda (diet and regular), Sweet Tea, Candies, Chips, Cookies, Store Bought Juices, Alcohol , Artificial Sweeteners,  Coffee Creamer, and "Sugar-free" Products, Lemonade. This will help patient to have more stable blood glucose profile and potentially avoid unintended weight gain.  The following Lifestyle Medicine recommendations according to [facility]) were discussed and and offered to patient and he  agrees to start the journey:  A. Whole Foods, Plant-Based Nutrition comprising of fruits and vegetables, plant-based proteins, whole-grain carbohydrates was discussed in detail with the patient.   A list for source of those nutrients were also provided to the patient.  Patient will use only water or unsweetened tea for hydration. B.  The need to stay away from risky substances including alcohol, smoking; obtaining 7 to 9 hours of restorative sleep, at least 150 minutes of moderate intensity exercise weekly, the importance of healthy social connections,  and stress management techniques were discussed. C.  A full color page of  Calorie density of various food groups per pound showing examples of each food groups was provided to the patient.   - he has been scheduled with Jearld Fenton, RDN, CDE for diabetes  education.  - I have approached him with the following individualized plan to manage  his diabetes and patient agrees:   -In light of his current and prevailing glycemic burden, he will continue to require multiple daily injections of insulin in order for him to control diabetes to target.  He did not follow recommendations for intensive basal/bolus insulin regimen.  He did relatively better on premixed insulin.  He is advised to increase NovoLog 70/30 to 55 units with breakfast and 50 units with supper    when Premeal blood glucose readings are above 90 mg per DL.  He is advised to continue to monitor blood glucose 4 times a day-daily before meals and at bedtime.  - he is warned not to take insulin without proper monitoring per orders.  - Adjustment parameters are given to him for hypo and hyperglycemia in writing. - he is encouraged to call clinic for blood glucose levels less than 70 or above 300 mg /dl. -He is advised to continue metformin  500 mg p.o. twice daily-daily after breakfast and after supper. -He is advised to continue glipizide 5 mg XL p.o.  daily at breakfast.    - he is not a candidate for incretin therapy due to his current heavy smoking, at risk for pancreatitis.    - Specific targets for  A1c;  LDL, HDL,  and Triglycerides were discussed with the patient.  2) Blood Pressure /Hypertension:  -His blood pressure is controlled to target.  he is advised to continue his current medications including Imdur 30 mg p.o. daily with breakfast .  3) Lipids/Hyperlipidemia:   Review of his recent lipid panel showed un controlled LDL at 49 overall improving from 164.  He is advised to continue atorvastatin 80 mg p.o. nightly.    Side effects and precautions discussed with him.  Plant dominant, Whole Foods lifestyle nutrition is discussed and recommended to him.  4)  Weight/Diet:  Body mass index is 22.74 kg/m.  -  he is not a candidate for major weight loss. I discussed with him the  fact that loss of 5 - 10% of his  current body weight will have the most impact on his diabetes management.  Exercise, and detailed carbohydrates information provided  -  detailed on discharge instructions.  5) Chronic Care/Health Maintenance:  -he  is on Statin medications and  is encouraged to initiate and continue to follow up with Ophthalmology, Dentist,  Podiatrist at least yearly or according to recommendations, and advised to  quit smoking. I have recommended yearly flu vaccine and pneumonia vaccine at least every 5 years; moderate intensity exercise for up to 150 minutes weekly; and  sleep for at least 7 hours a day.   The patient was counseled on the dangers of tobacco use, and was advised to quit.  Reviewed strategies to maximize success, including removing cigarettes and smoking materials from environment.  He had normal ABI on May 08, 2020.  This study will be repeated in December 2026, or sooner if needed.  - he is  advised to maintain close follow up with Soyla Dryer, PA-C for primary care needs, as well as his other providers for optimal and coordinated care.   I spent 42 minutes in the care of the patient today including review of labs from [place], Lipids, Thyroid Function, Hematology (current and previous including abstractions from other facilities); face-to-face time discussing  his blood glucose readings/logs, discussing hypoglycemia and hyperglycemia episodes and symptoms, medications doses, his options of short and long term treatment based on the latest standards of care / guidelines;  discussion about incorporating lifestyle medicine;  and documenting the encounter.    Please refer to Patient Instructions for Blood Glucose Monitoring and Insulin/Medications Dosing Guide"  in media tab for additional information. Please  also refer to " Patient Self Inventory" in the Media  tab for reviewed elements of pertinent patient history.  [name] [name] participated in the  discussions, expressed understanding, and voiced agreement with the above plans.  All questions were answered to his satisfaction. he is encouraged to contact clinic should he have any questions or concerns prior to his return visit.    Follow up plan: Return in 7 days for reevaluation. Glade Lloyd, MD [facility] [address] [place], [place] [address] Phone: [phone]  Fax: [phone]    06/10/2021, 4:03 PM  This note was partially dictated with voice recognition software. Similar sounding words can be transcribed inadequately or may not  be corrected upon review.

## 2021-06-16 ENCOUNTER — Ambulatory Visit: Payer: Self-pay | Admitting: Physician Assistant

## 2021-06-16 ENCOUNTER — Encounter: Payer: Self-pay | Admitting: Physician Assistant

## 2021-06-16 ENCOUNTER — Other Ambulatory Visit: Payer: Self-pay

## 2021-06-16 VITALS — BP 132/85 | HR 107 | Temp 98.9°F | Wt 145.0 lb

## 2021-06-16 DIAGNOSIS — E1165 Type 2 diabetes mellitus with hyperglycemia: Secondary | ICD-10-CM

## 2021-06-16 DIAGNOSIS — K2270 Barrett's esophagus without dysplasia: Secondary | ICD-10-CM

## 2021-06-16 DIAGNOSIS — N189 Chronic kidney disease, unspecified: Secondary | ICD-10-CM

## 2021-06-16 DIAGNOSIS — F39 Unspecified mood [affective] disorder: Secondary | ICD-10-CM

## 2021-06-16 DIAGNOSIS — R634 Abnormal weight loss: Secondary | ICD-10-CM

## 2021-06-16 DIAGNOSIS — R0600 Dyspnea, unspecified: Secondary | ICD-10-CM

## 2021-06-16 DIAGNOSIS — F17200 Nicotine dependence, unspecified, uncomplicated: Secondary | ICD-10-CM

## 2021-06-16 NOTE — Patient Instructions (Signed)
Call gastroenterology for appointment  [phone]

## 2021-06-16 NOTE — Progress Notes (Signed)
BP 132/85    Pulse (!) 107    Temp 98.9 F (37.2 C)    Wt 145 lb (65.8 kg)    SpO2 99%    BMI 22.71 kg/m    Subjective:    Patient ID: [name] [name], male    DOB: 1970/07/12, 51 y.o.   MRN: [id number]  HPI: [name] [name] is a 51 y.o. male presenting on 06/16/2021 for Follow-up   HPI   Chief Complaint  Patient presents with   Follow-up     He is feeling better today than he did at previous appointment.  He saw endocrinology since his last visit here.    He is drinking plenty water.  He says his Mood is up and down  He says his Breathing is okay.  He has a little sob.  He is Still smoking.   He is going [facility] for [facility] meds.    He does not getting counseling there.   He had appointment with [facility] here but was no show.  He would like to reschedule with the counselor here.  He did not reschedule with GI as encouraged.    He says he will do it now.   He has GERD and Barrett's esophagus.       Relevant past medical, surgical, family and social history reviewed and updated as indicated. Interim medical history since our last visit reviewed. Allergies and medications reviewed and updated.   Current Outpatient Medications:    ARIPiprazole (ABILIFY) 10 MG tablet, TAKE 1 Tablet BY MOUTH ONCE EVERY NIGHT AT BEDTIME DOSE CHANGE, Disp: , Rfl:    ASPIRIN LOW DOSE 81 MG EC tablet, Take 81 mg by mouth every morning., Disp: , Rfl:    atorvastatin (LIPITOR) 80 MG tablet, TAKE 1 Tablet BY MOUTH ONCE EVERY DAY, Disp: 90 tablet, Rfl: 0   famotidine (PEPCID) 40 MG tablet, Take 1 tablet (40 mg total) by mouth 2 (two) times daily., Disp: 60 tablet, Rfl: 3   Fluticasone-Salmeterol (ADVAIR DISKUS IN), Inhale 1 puff into the lungs daily. Rinse mouth after use, Disp: , Rfl:    gabapentin (NEURONTIN) 100 MG capsule, Take 1 capsule (100 mg total) by mouth 3 (three) times daily as needed., Disp: 90 capsule, Rfl: 0   glipiZIDE (GLUCOTROL XL) 5 MG 24 hr tablet, Take 1 tablet (5 mg total) by mouth  daily with breakfast., Disp: 30 tablet, Rfl: 3   insulin aspart protamine - aspart (NOVOLOG MIX 70/30 FLEXPEN) (70-30) 100 UNIT/ML FlexPen, Inject 50-55 Units into the skin 2 (two) times daily with a meal. Only with breakfast and supper when premeal glucose is above 90, Disp: 30 mL, Rfl: 2   metFORMIN (GLUCOPHAGE) 500 MG tablet, TAKE 1 Tablet  BY MOUTH TWICE DAILY WITH A MEAL, Disp: 180 tablet, Rfl: 0   metoCLOPramide (REGLAN) 5 MG tablet, Take 1 tablet (5 mg total) by mouth 4 (four) times daily -  before meals and at bedtime., Disp: 120 tablet, Rfl: 1   sertraline (ZOLOFT) 100 MG tablet, Take 1 tablet by mouth daily., Disp: , Rfl:    traZODone (DESYREL) 50 MG tablet, Take 50 mg by mouth at bedtime., Disp: , Rfl:    albuterol (VENTOLIN HFA) 108 (90 Base) MCG/ACT inhaler, INHALE 2 PUFFS BY MOUTH EVERY 6 HOURS AS NEEDED FOR COUGHING, WHEEZING, OR SHORTNESS OF BREATH (Patient not taking: Reported on 05/26/2021), Disp: 20.1 g, Rfl: 0   metoprolol tartrate (LOPRESSOR) 25 MG tablet, Take 0.5 tablets (12.5 mg  total) by mouth 2 (two) times daily. (Patient not taking: Reported on 06/16/2021), Disp: 90 tablet, Rfl: 3   nitroGLYCERIN (NITROSTAT) 0.4 MG SL tablet, Place 1 tablet (0.4 mg total) under the tongue every 5 (five) minutes as needed for chest pain. (Patient not taking: Reported on 04/13/2021), Disp: 25 tablet, Rfl: 3   ondansetron (ZOFRAN-ODT) 4 MG disintegrating tablet, Take 1 tablet (4 mg total) by mouth every 8 (eight) hours as needed for nausea or vomiting. (Patient not taking: Reported on 06/16/2021), Disp: 10 tablet, Rfl: 0   polyethylene glycol (MIRALAX / GLYCOLAX) 17 g packet, Take 17 g by mouth daily. (Patient not taking: Reported on 04/13/2021), Disp: 14 each, Rfl: 0   promethazine (PHENERGAN) 25 MG suppository, Place 1 suppository (25 mg total) rectally every 8 (eight) hours as needed for refractory nausea / vomiting. (Patient not taking: Reported on 04/13/2021), Disp: 12 each, Rfl: 0    Review  of Systems  Per HPI unless specifically indicated above     Objective:    BP 132/85    Pulse (!) 107    Temp 98.9 F (37.2 C)    Wt 145 lb (65.8 kg)    SpO2 99%    BMI 22.71 kg/m   Wt Readings from Last 3 Encounters:  06/16/21 145 lb (65.8 kg)  06/10/21 145 lb 3.2 oz (65.9 kg)  05/26/21 146 lb (66.2 kg)    Physical Exam Vitals reviewed.  Constitutional:      General: He is not in acute distress.    Appearance: He is well-developed. He is not toxic-appearing.  HENT:     Head: Normocephalic and atraumatic.  Cardiovascular:     Rate and Rhythm: Normal rate and regular rhythm.  Pulmonary:     Effort: Pulmonary effort is normal.     Breath sounds: Normal breath sounds. No wheezing.  Abdominal:     General: Bowel sounds are normal.     Palpations: Abdomen is soft.     Tenderness: There is no abdominal tenderness.  Musculoskeletal:     Cervical back: Neck supple.     Right lower leg: No edema.     Left lower leg: No edema.  Lymphadenopathy:     Cervical: No cervical adenopathy.  Skin:    General: Skin is warm and dry.  Neurological:     Mental Status: He is alert and oriented to person, place, and time.  Psychiatric:        Attention and Perception: Attention normal.        Speech: Speech normal.        Behavior: Behavior normal. Behavior is cooperative.    Results for orders placed or performed during the hospital encounter of [id number]  Basic metabolic panel  Result Value Ref Range   Sodium 133 (L) 135 - 145 mmol/L   Potassium 4.8 3.5 - 5.1 mmol/L   Chloride 99 98 - 111 mmol/L   CO2 24 22 - 32 mmol/L   Glucose, Bld 362 (H) 70 - 99 mg/dL   BUN 14 6 - 20 mg/dL   Creatinine, Ser 1.01 0.61 - 1.24 mg/dL   Calcium 8.6 (L) 8.9 - 10.3 mg/dL   GFR, Estimated >60 >60 mL/min   Anion gap 10 5 - 15      Assessment & Plan:    Encounter Diagnoses  Name Primary?   Weight loss Yes   Tobacco use disorder    Dyspnea, unspecified type    Uncontrolled type 2 diabetes  mellitus  with hyperglycemia ([place])    Chronic kidney disease, unspecified CKD stage    Barrett's esophagus without dysplasia    Mood disorder (HCC)       -weight loss is likely due to uncontrolled diabetes but will Order Ct chest due to the weight loss and history of smoking -Pt to call to reschedule with GI -he is scheduled to see [facility] -pt to Continue with regular appointments with endocrinology -pt to follow up 6 wk.  He is to contact office sooner for worsening or new symptoms

## 2021-06-24 ENCOUNTER — Ambulatory Visit: Payer: Self-pay | Admitting: Licensed Clinical Social Worker

## 2021-06-24 ENCOUNTER — Other Ambulatory Visit: Payer: Self-pay

## 2021-06-24 DIAGNOSIS — F32A Depression, unspecified: Secondary | ICD-10-CM

## 2021-06-24 NOTE — Progress Notes (Signed)
[facility] engaged patient in initial [facility] session. [facility] provided active listening and validation as client shared about past traumas, losses, and current symptoms of depression and anxiety. Next session was scheduled for two weeks from current date.

## 2021-06-25 ENCOUNTER — Ambulatory Visit (INDEPENDENT_AMBULATORY_CARE_PROVIDER_SITE_OTHER): Payer: Self-pay | Admitting: Gastroenterology

## 2021-06-25 ENCOUNTER — Other Ambulatory Visit: Payer: Self-pay

## 2021-06-25 ENCOUNTER — Encounter: Payer: Self-pay | Admitting: Gastroenterology

## 2021-06-25 VITALS — BP 100/70 | HR 104 | Ht 67.0 in | Wt 149.0 lb

## 2021-06-25 DIAGNOSIS — R112 Nausea with vomiting, unspecified: Secondary | ICD-10-CM

## 2021-06-25 DIAGNOSIS — K2270 Barrett's esophagus without dysplasia: Secondary | ICD-10-CM

## 2021-06-25 DIAGNOSIS — E1140 Type 2 diabetes mellitus with diabetic neuropathy, unspecified: Secondary | ICD-10-CM

## 2021-06-25 DIAGNOSIS — K219 Gastro-esophageal reflux disease without esophagitis: Secondary | ICD-10-CM

## 2021-06-25 DIAGNOSIS — K449 Diaphragmatic hernia without obstruction or gangrene: Secondary | ICD-10-CM

## 2021-06-25 NOTE — Progress Notes (Signed)
Chief Complaint:    GERD  GI History: 51 y.o. male with a history of CAD, COPD, depression, diabetes, tobacco use, diabetic neuropathy, GERD, hyperlipidemia, HTN, migraines, OSA, chronic bronchitis, gout, anxiety, depression, initially referred to me by Dr. Levon Hedger in 12/2020 for evaluation of possible antireflux intervention with Transoral Incisionless Fundoplication (TIF) with a goal to stop or significantly reduce acid suppression therapy.   Was previously thought to have some degree of gastroparesis due to uncontrolled diabetes, managed with Reglan and PPI.  Allergic reaction to Nexium and omeprazole (shortness of breath, itching, facial swelling) and transition to H2 blockers.   Has otherwise had reflux symptoms for many years.     Currently prescribed Reglan 5 mg prn- takes about 2 times/week for nausea/vomiting. Has been taking for years.  No tardive dyskinesia.   GERD history: -Index symptoms: Heartburn, regurgitation, dysphagia -Exacerbating features: Spicy food, supine -Medications trialed: Nexium, omeprazole.  Allergy to PPIs.  Pepcid -Current medications: Pepcid 40 mg/day, Reglan -Complications: Barrett's Esophagus   GERD evaluation: -Last EGD: 03/2019 -Barium esophagram: None -Esophageal Manometry: None -pH/Impedance: None -Bravo: None -GES (11/06/2020): Normal   Endoscopic History: - EGD (06/2015): Small HH,1 tongue of salmon-colored mucosa (path: Intestinal metaplasia), normal stomach/duodenum - EGD (11/2018): LA Grade D esophagitis, Barrett's esophagus, duodenitis - EGD (11/2018): 2 cm Short segment Barrett's esophagus, irregular Z-line, 2 cm HH, portal hypertensive gastropathy, normal duodenum    GERD-HRQL Questionnaire Score (12/2020): 40/50   -CT abdomen/pelvis: 11/11/2020: Normal liver, pancreas, spleen.  Mild thickening of distal thoracic esophagus  HPI:     Patient is a 51 y.o. male presenting to the Gastroenterology Clinic for follow-up.  Initially seen by  me on 01/01/2021 for evaluation of antireflux surgical options.  Decided against surgery given poorly controlled diabetes (A1c 13.6) and questionable history of gastroparesis, although most recent GES is negative/normal (although was taking Reglan).  Made tentative plan for repeat upper endoscopy for hernia evaluation and reevaluation when diabetes under better control.  Was a no-show for he canceled EGD in September and had not rescheduled.  Today, he states he continues to have reflux sxs, about the same as previous. Described as waterbrash, gagging, and nausea/emesis. +belching. No HB. Still taking Pepcid 40 mg BID. Uses Reglan a few nights/week for nausea. Allergy to PPI as above.  Continues sleeping with HOB elevated.  Most recent A1c 12.5% earlier this month.  Follows in the Endocrinology clinic, last seen 06/10/2021.  Continues to smoke.  Review of systems:     No chest pain, no SOB, no fevers, no urinary sx   Past Medical History:  Diagnosis Date   Anxiety    Arthritis    Asthma    CAD (coronary artery disease)    Moderate LAD disease 2016 - Dr. 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   Type 2 diabetes mellitus (HCC)     Patient's surgical history, family medical history, social history, medications and allergies were all reviewed in Epic    Current Outpatient Medications  Medication Sig Dispense Refill   albuterol (VENTOLIN HFA)  108 (90 Base) MCG/ACT inhaler INHALE 2 PUFFS BY MOUTH EVERY 6 HOURS AS NEEDED FOR COUGHING, WHEEZING, OR SHORTNESS OF BREATH 20.1 g 0   ARIPiprazole (ABILIFY) 10 MG tablet TAKE 1 Tablet BY MOUTH ONCE EVERY NIGHT AT  BEDTIME DOSE CHANGE     ASPIRIN LOW DOSE 81 MG EC tablet Take 81 mg by mouth every morning.     atorvastatin (LIPITOR) 80 MG tablet TAKE 1 Tablet BY MOUTH ONCE EVERY DAY 90 tablet 0   famotidine (PEPCID) 40 MG tablet Take 1 tablet (40 mg total) by mouth 2 (two) times daily. 60 tablet 3   Fluticasone-Salmeterol (ADVAIR DISKUS IN) Inhale 1 puff into the lungs daily. Rinse mouth after use     gabapentin (NEURONTIN) 100 MG capsule Take 1 capsule (100 mg total) by mouth 3 (three) times daily as needed. 90 capsule 0   glipiZIDE (GLUCOTROL XL) 5 MG 24 hr tablet Take 1 tablet (5 mg total) by mouth daily with breakfast. 30 tablet 3   insulin aspart protamine - aspart (NOVOLOG MIX 70/30 FLEXPEN) (70-30) 100 UNIT/ML FlexPen Inject 50-55 Units into the skin 2 (two) times daily with a meal. Only with breakfast and supper when premeal glucose is above 90 30 mL 2   metFORMIN (GLUCOPHAGE) 500 MG tablet TAKE 1 Tablet  BY MOUTH TWICE DAILY WITH A MEAL 180 tablet 0   metoCLOPramide (REGLAN) 5 MG tablet Take 1 tablet (5 mg total) by mouth 4 (four) times daily -  before meals and at bedtime. 120 tablet 1   metoprolol tartrate (LOPRESSOR) 25 MG tablet Take 0.5 tablets (12.5 mg total) by mouth 2 (two) times daily. 90 tablet 3   nitroGLYCERIN (NITROSTAT) 0.4 MG SL tablet Place 1 tablet (0.4 mg total) under the tongue every 5 (five) minutes as needed for chest pain. 25 tablet 3   ondansetron (ZOFRAN-ODT) 4 MG disintegrating tablet Take 1 tablet (4 mg total) by mouth every 8 (eight) hours as needed for nausea or vomiting. 10 tablet 0   polyethylene glycol (MIRALAX / GLYCOLAX) 17 g packet Take 17 g by mouth daily. 14 each 0   promethazine (PHENERGAN) 25 MG suppository Place 1 suppository (25 mg total) rectally every 8 (eight) hours as needed for refractory nausea / vomiting. 12 each 0   sertraline (ZOLOFT) 100 MG tablet Take 1 tablet by mouth daily.     traZODone (DESYREL) 50 MG tablet Take 50 mg by mouth at bedtime.     No  current facility-administered medications for this visit.    Physical Exam:     BP 100/70    Pulse (!) 104   GENERAL:  Pleasant male in NAD PSYCH: : Cooperative, normal affect CARDIAC:  RRR, no murmur heard, no peripheral edema PULM: Normal respiratory effort, lungs CTA bilaterally, no wheezing ABDOMEN:  Nondistended, soft, nontender. No obvious masses, no hepatomegaly,  normal bowel sounds SKIN:  turgor, no lesions seen Musculoskeletal:  Normal muscle tone, normal strength NEURO: Alert and oriented x 3, no focal neurologic deficits   IMPRESSION and PLAN:    1) GERD 2) Short segment nondysplastic Barrett's Esophagus 3) Hiatal hernia 4) Nausea/Vomiting We again had a long conversation today regarding his GERD.  Allergy/intolerance to PPIs in the past which limits medication options.  Has breakthrough symptoms despite taking Pepcid 40 mg/day.  Discussed options of increasing Pepcid to bid dosing and further studies as below.  Additionally, his poorly controlled diabetes certainly complicates matters.  This limits some of his surgical options  given concern for poor wound healing, poor tolerance to surgery, and elevated risks for gastroparesis (if he does not already have).  I explained that currently I would not proceed with TIF or cTIF based on those concerns.  After work-up as below, if you would still like to explore surgical options, I would be happy to send a referral to the surgical clinic for evaluation of hiatal hernia repair and antireflux options.  - Continue Pepcid - Continue antireflux lifestyle/dietary modifications - Repeat EGD now for Barrett's Esophagus surveillance along with evaluation for erosive esophagitis and resize hernia for potential future surgery - Esophageal Manometry and pH/Impedance testing (on Pepcid) to eval for breakthough reflux symptoms - Again counseled on the risks of prolonged Reglan use, to include tardive dyskinesia (also increased risk of TD with  Abilify).  He is aware and has been counseled few times in the past and wishes to proceed using Reglan prn -Given intolerance to PPIs, possibly a candidate for vonoprazam depending on above studies  5) Diabetes - Again explained increased postoperative risks with poorly controlled diabetes - Continue close follow-up with PCP and Endocrinology clinic  6) CAD - Ok to resume ASA 81 mg in the perioperative period  The indications, risks, and benefits of EGD were explained to the patient in detail. Risks include but are not limited to bleeding, perforation, adverse reaction to medications, and cardiopulmonary compromise. Sequelae include but are not limited to the possibility of surgery, hospitalization, and mortality. The patient verbalized understanding and wished to proceed. All questions answered, referred to scheduler. Further recommendations pending results of the exam.        Dominic Pea Elleana Stillson ,DO, FACG 06/25/2021, 11:10 AM

## 2021-06-25 NOTE — Patient Instructions (Addendum)
If you are age 52 or younger, your body mass index should be between 19-25. Your There is no height or weight on file to calculate BMI. If this is out of the aformentioned range listed, please consider follow up with your Primary Care Provider.   __________________________________________________________  The [facility] providers would like to encourage you to use [facility] to communicate with providers for non-urgent requests or questions.  Due to long hold times on the telephone, sending your provider a message by [facility] may be a faster and more efficient way to get a response.  Please allow 48 business hours for a response.  Please remember that this is for non-urgent requests.   Due to recent changes in healthcare laws, you may see the results of your imaging and laboratory studies on MyChart before your provider has had a chance to review them.  We understand that in some cases there may be results that are confusing or concerning to you. Not all laboratory results come back in the same time frame and the provider may be waiting for multiple results in order to interpret others.  Please give [place] 48 hours in order for your provider to thoroughly review all the results before contacting the office for clarification of your results.   You have been scheduled for an endoscopy. Please follow written instructions given to you at your visit today. If you use inhalers (even only as needed), please bring them with you on the day of your procedure.   You have been scheduled for an esophageal manometry test at [facility] on 09/16/21 at 12:30. Please arrive 30 minutes prior to your procedure for registration. You will need to go to outpatient registration (1st floor of the hospital) first. Make certain to bring your insurance cards as well as a complete list of medications.  Please remember the following:  1) Do not take any muscle relaxants, xanax (alprazolam) or ativan for 1 day prior to your test  as well as the day of the test.  2) Nothing to eat or drink after 12:00 midnight on the night before your test.  3) Hold all diabetic medications/insulin the morning of the test. You may eat and take your medications after the test.  4) continue taking Pepcid  It will take at least 2 weeks to receive the results of this test from your physician.  ------------------------------------------ ABOUT ESOPHAGEAL MANOMETRY Esophageal manometry (muh-NOM-uh-tree) is a test that gauges how well your esophagus works. Your esophagus is the long, muscular tube that connects your throat to your stomach. Esophageal manometry measures the rhythmic muscle contractions (peristalsis) that occur in your esophagus when you swallow. Esophageal manometry also measures the coordination and force exerted by the muscles of your esophagus.  During esophageal manometry, a thin, flexible tube (catheter) that contains sensors is passed through your nose, down your esophagus and into your stomach. Esophageal manometry can be helpful in diagnosing some mostly uncommon disorders that affect your esophagus.  Why it's done Esophageal manometry is used to evaluate the movement (motility) of food through the esophagus and into the stomach. The test measures how well the circular bands of muscle (sphincters) at the top and bottom of your esophagus open and close, as well as the pressure, strength and pattern of the wave of esophageal muscle contractions that moves food along.  What you can expect Esophageal manometry is an outpatient procedure done without sedation. Most people tolerate it well. You may be asked to change into a hospital  gown before the test starts.  During esophageal manometry  While you are sitting up, a member of your health care team sprays your throat with a numbing medication or puts numbing gel in your nose or both.  A catheter is guided through your nose into your esophagus. The catheter may be sheathed in a  water-filled sleeve. It doesn't interfere with your breathing. However, your eyes may water, and you may gag. You may have a slight nosebleed from irritation.  After the catheter is in place, you may be asked to lie on your back on an exam table, or you may be asked to remain seated.  You then swallow small sips of water. As you do, a computer connected to the catheter records the pressure, strength and pattern of your esophageal muscle contractions.  During the test, you'll be asked to breathe slowly and smoothly, remain as still as possible, and swallow only when you're asked to do so.  A member of your health care team may move the catheter down into your stomach while the catheter continues its measurements.  The catheter then is slowly withdrawn. The test usually lasts 20 to 30 minutes.  After esophageal manometry  When your esophageal manometry is complete, you may return to your normal activities  This test typically takes 30-45 minutes to complete. ________________________________________________________________________________   Thank you for choosing me and [facility].  Vito Cirigliano, D.O.

## 2021-07-03 ENCOUNTER — Encounter: Payer: Self-pay | Admitting: Gastroenterology

## 2021-07-03 ENCOUNTER — Ambulatory Visit (AMBULATORY_SURGERY_CENTER): Payer: Self-pay | Admitting: Gastroenterology

## 2021-07-03 VITALS — BP 128/84 | HR 96 | Temp 97.1°F | Resp 12 | Ht 67.0 in | Wt 149.0 lb

## 2021-07-03 DIAGNOSIS — K31A0 Gastric intestinal metaplasia, unspecified: Secondary | ICD-10-CM

## 2021-07-03 DIAGNOSIS — K2270 Barrett's esophagus without dysplasia: Secondary | ICD-10-CM

## 2021-07-03 DIAGNOSIS — K2970 Gastritis, unspecified, without bleeding: Secondary | ICD-10-CM

## 2021-07-03 DIAGNOSIS — R112 Nausea with vomiting, unspecified: Secondary | ICD-10-CM

## 2021-07-03 DIAGNOSIS — K317 Polyp of stomach and duodenum: Secondary | ICD-10-CM

## 2021-07-03 DIAGNOSIS — K2100 Gastro-esophageal reflux disease with esophagitis, without bleeding: Secondary | ICD-10-CM

## 2021-07-03 DIAGNOSIS — K449 Diaphragmatic hernia without obstruction or gangrene: Secondary | ICD-10-CM

## 2021-07-03 DIAGNOSIS — E1140 Type 2 diabetes mellitus with diabetic neuropathy, unspecified: Secondary | ICD-10-CM

## 2021-07-03 MED ORDER — SODIUM CHLORIDE 0.9 % IV SOLN: 500.0000 mL | Freq: Once | INTRAVENOUS | Status: DC

## 2021-07-03 MED ORDER — INSULIN ASPART 100 UNIT/ML IJ SOLN
6.0000 [IU] | Freq: Once | INTRAMUSCULAR | Status: AC
  Administered 2021-07-03: 6 [IU] via SUBCUTANEOUS

## 2021-07-03 NOTE — Progress Notes (Signed)
Report to PACU, RN, vss, BBS= Clear.  

## 2021-07-03 NOTE — Progress Notes (Signed)
GASTROENTEROLOGY PROCEDURE H&P NOTE   Primary Care Physician: Soyla Dryer, PA-C    Reason for Procedure:   GERD with esophagitis, hiatal hernia, Barrett's Esophagus, nausea/vomiting  Plan:    EGD  Patient is appropriate for endoscopic procedure(s) in the ambulatory ([place]) setting.  The nature of the procedure, as well as the risks, benefits, and alternatives were carefully and thoroughly reviewed with the patient. Ample time for discussion and questions allowed. The patient understood, was satisfied, and agreed to proceed.     HPI: [name] [name] is a 51 y.o. male who presents for EGD for evaluation of GERD with erosive esophagitis, NDBE, hiatal hernia, and nausea/vomiting .  Patient was most recently seen in the Gastroenterology Clinic on 06/25/2021 by me.  No interval change in medical history since that appointment. Please refer to that note for full details regarding GI history and clinical presentation.   BG in pre-op was 348. Treated with 6U insulin per sliding scale protocol.   Past Medical History:  Diagnosis Date   Anxiety    Arthritis    Asthma    CAD (coronary artery disease)    Moderate LAD disease 2016 - Dr. 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   Type 2 diabetes mellitus ([place])     Past Surgical History:  Procedure Laterality Date   ANKLE SURGERY Right 1982   "had extra bones in there; took them out"   [place]  12/26/2018   Procedure: BIOPSY;  Surgeon: Rogene Houston, MD;  Location: [facility];  Service: Endoscopy;;  duodenal biopsies   BIOPSY  03/23/2019   Procedure: BIOPSY;  Surgeon:  Rogene Houston, MD;  [facility]: [facility];  Service: Endoscopy;;  esophagus   CARDIAC CATHETERIZATION N/A 03/28/2015   Procedure: Left Heart Cath and Coronary Angiography;  Surgeon: Adrian Prows, MD;  Location: [place] [facility];  Service: Cardiovascular;  Laterality: N/A;   CARDIAC CATHETERIZATION N/A 03/28/2015   Procedure: Intravascular Pressure Wire/FFR Study;  Surgeon: Adrian Prows, MD;  Location: [place] [facility];  Service: Cardiovascular;  Laterality: N/A;   CARPAL TUNNEL RELEASE Left ~ 2008   COLONOSCOPY WITH ESOPHAGOGASTRODUODENOSCOPY (EGD)     COLONOSCOPY WITH PROPOFOL N/A 12/24/2019   Procedure: COLONOSCOPY WITH PROPOFOL;  Surgeon: Rogene Houston, MD;  [facility];  Service: Endoscopy;  Laterality: N/A;  955   ELBOW FRACTURE SURGERY Left ~ 2008   ESOPHAGOGASTRODUODENOSCOPY N/A 12/26/2018   Procedure: ESOPHAGOGASTRODUODENOSCOPY (EGD);  Surgeon: Rogene Houston, MD;  [facility];  Service: Endoscopy;  Laterality: N/A;   ESOPHAGOGASTRODUODENOSCOPY (EGD) WITH PROPOFOL N/A 03/23/2019   Procedure: ESOPHAGOGASTRODUODENOSCOPY (EGD) WITH PROPOFOL;  Surgeon: Rogene Houston, MD;  [facility];  Service: Endoscopy;  Laterality: N/A;  7:30   FRACTURE SURGERY     ankle and elbow   PILONIDAL CYST EXCISION N/A 03/24/2017   Procedure: EXCISION CHRONIC  PILONIDAL ABSCESS;  Surgeon: Coralie Keens, MD;  Location: [facility];  Service: General;  Laterality: N/A;   SCROTAL EXPLORATION  N/A 11/13/2020   Procedure: EXCISION OF SEBACEOUS CYSTS, SCROTUM;  Surgeon: Cleon Gustin, MD;  Location: [facility];  Service: Urology;  Laterality: N/A;   TENDON REPAIR Left ~ 2004   "main tendon in my ankle"    Prior to Admission medications   Medication Sig Start Date End Date Taking? Authorizing Provider  albuterol (VENTOLIN HFA) 108 (90 Base) MCG/ACT inhaler INHALE 2 PUFFS BY MOUTH EVERY 6 HOURS AS NEEDED FOR COUGHING, WHEEZING, OR SHORTNESS OF BREATH 05/21/21  Yes Soyla Dryer, PA-C  ARIPiprazole (ABILIFY) 10 MG tablet TAKE 1 Tablet BY MOUTH ONCE EVERY NIGHT AT BEDTIME DOSE CHANGE 11/10/20  Yes [provider]  ASPIRIN LOW DOSE 81 MG EC tablet Take 81 mg by mouth every morning. 09/05/20  Yes [provider]  atorvastatin (LIPITOR) 80 MG tablet TAKE 1 Tablet BY MOUTH ONCE EVERY DAY 05/21/21  Yes Soyla Dryer, PA-C  famotidine (PEPCID) 40 MG tablet Take 1 tablet (40 mg total) by mouth 2 (two) times daily. 05/26/21  Yes Soyla Dryer, PA-C  gabapentin (NEURONTIN) 100 MG capsule Take 1 capsule (100 mg total) by mouth 3 (three) times daily as needed. 03/10/21  Yes Soyla Dryer, PA-C  glipiZIDE (GLUCOTROL XL) 5 MG 24 hr tablet Take 1 tablet (5 mg total) by mouth daily with breakfast. 11/10/20  Yes Nida, Marella Chimes, MD  insulin aspart protamine - aspart (NOVOLOG MIX 70/30 FLEXPEN) (70-30) 100 UNIT/ML FlexPen Inject 50-55 Units into the skin 2 (two) times daily with a meal. Only with breakfast and supper when premeal glucose is above 90 06/10/21  Yes Nida, Marella Chimes, MD  metFORMIN (GLUCOPHAGE) 500 MG tablet TAKE 1 Tablet  BY MOUTH TWICE DAILY WITH A MEAL 02/12/21  Yes Nida, Marella Chimes, MD  metoCLOPramide (REGLAN) 5 MG tablet Take 1 tablet (5 mg total) by mouth 4 (four) times daily -  before meals and at bedtime. 11/22/19  Yes Laurine Blazer B, PA-C  metoprolol tartrate (LOPRESSOR) 25 MG tablet Take 0.5 tablets (12.5 mg total) by mouth 2 (two) times daily. 02/16/21  Yes Imogene Burn, PA-C  polyethylene glycol (MIRALAX / GLYCOLAX) 17 g packet Take 17 g by mouth daily. 11/11/20  Yes Pollina, Gwenyth Allegra, MD  sertraline (ZOLOFT) 100 MG tablet Take 1 tablet by mouth daily. 01/09/20  Yes [provider]  traZODone (DESYREL) 50 MG tablet Take 50 mg by mouth at bedtime. 10/03/20  Yes [provider]  Fluticasone-Salmeterol (ADVAIR DISKUS IN) Inhale 1 puff into the lungs daily. Rinse mouth after use Patient not taking:  Reported on 07/03/2021    [provider]  nitroGLYCERIN (NITROSTAT) 0.4 MG SL tablet Place 1 tablet (0.4 mg total) under the tongue every 5 (five) minutes as needed for chest pain. Patient not taking: Reported on 07/03/2021 10/05/17   Soyla Dryer, PA-C  ondansetron (ZOFRAN-ODT) 4 MG disintegrating tablet Take 1 tablet (4 mg total) by mouth every 8 (eight) hours as needed for nausea or vomiting. Patient not taking: Reported on 07/03/2021 04/22/21   Montine Circle, PA-C  promethazine (PHENERGAN) 25 MG suppository Place 1 suppository (25 mg total) rectally every 8 (eight) hours as needed for refractory nausea / vomiting. Patient not taking: Reported on 07/03/2021 12/20/18   Barton Dubois, MD    Current Outpatient Medications  Medication Sig Dispense Refill   albuterol (VENTOLIN HFA) 108 (90 Base) MCG/ACT inhaler INHALE 2 PUFFS BY MOUTH EVERY 6 HOURS AS NEEDED FOR COUGHING, WHEEZING, OR SHORTNESS OF BREATH 20.1 g 0  ARIPiprazole (ABILIFY) 10 MG tablet TAKE 1 Tablet BY MOUTH ONCE EVERY NIGHT AT BEDTIME DOSE CHANGE     ASPIRIN LOW DOSE 81 MG EC tablet Take 81 mg by mouth every morning.     atorvastatin (LIPITOR) 80 MG tablet TAKE 1 Tablet BY MOUTH ONCE EVERY DAY 90 tablet 0   famotidine (PEPCID) 40 MG tablet Take 1 tablet (40 mg total) by mouth 2 (two) times daily. 60 tablet 3   gabapentin (NEURONTIN) 100 MG capsule Take 1 capsule (100 mg total) by mouth 3 (three) times daily as needed. 90 capsule 0   glipiZIDE (GLUCOTROL XL) 5 MG 24 hr tablet Take 1 tablet (5 mg total) by mouth daily with breakfast. 30 tablet 3   insulin aspart protamine - aspart (NOVOLOG MIX 70/30 FLEXPEN) (70-30) 100 UNIT/ML FlexPen Inject 50-55 Units into the skin 2 (two) times daily with a meal. Only with breakfast and supper when premeal glucose is above 90 30 mL 2   metFORMIN (GLUCOPHAGE) 500 MG tablet TAKE 1 Tablet  BY MOUTH TWICE DAILY WITH A MEAL 180 tablet 0   metoCLOPramide (REGLAN) 5 MG tablet Take 1 tablet (5 mg  total) by mouth 4 (four) times daily -  before meals and at bedtime. 120 tablet 1   metoprolol tartrate (LOPRESSOR) 25 MG tablet Take 0.5 tablets (12.5 mg total) by mouth 2 (two) times daily. 90 tablet 3   polyethylene glycol (MIRALAX / GLYCOLAX) 17 g packet Take 17 g by mouth daily. 14 each 0   sertraline (ZOLOFT) 100 MG tablet Take 1 tablet by mouth daily.     traZODone (DESYREL) 50 MG tablet Take 50 mg by mouth at bedtime.     Fluticasone-Salmeterol (ADVAIR DISKUS IN) Inhale 1 puff into the lungs daily. Rinse mouth after use (Patient not taking: Reported on 07/03/2021)     nitroGLYCERIN (NITROSTAT) 0.4 MG SL tablet Place 1 tablet (0.4 mg total) under the tongue every 5 (five) minutes as needed for chest pain. (Patient not taking: Reported on 07/03/2021) 25 tablet 3   ondansetron (ZOFRAN-ODT) 4 MG disintegrating tablet Take 1 tablet (4 mg total) by mouth every 8 (eight) hours as needed for nausea or vomiting. (Patient not taking: Reported on 07/03/2021) 10 tablet 0   promethazine (PHENERGAN) 25 MG suppository Place 1 suppository (25 mg total) rectally every 8 (eight) hours as needed for refractory nausea / vomiting. (Patient not taking: Reported on 07/03/2021) 12 each 0   Current Facility-Administered Medications  Medication Dose Route Frequency Provider Last Rate Last Admin   0.9 %  sodium chloride infusion  500 mL Intravenous Once Cherre Kothari V, DO        Allergies as of 07/03/2021 - Review Complete 07/03/2021  Allergen Reaction Noted   Omeprazole magnesium Swelling 03/28/2015   Bee venom  01/17/2020   Esomeprazole Swelling 10/15/2016   Morphine and related  09/03/2020    Family History  Problem Relation Age of Onset   Diabetes Mother    Hypertension Mother    Cancer Mother    Throat cancer Mother    Diabetes Father    Hypertension Father    Hyperlipidemia Father    Congestive Heart Failure Sister    Colon cancer Neg Hx    Pancreatic cancer Neg Hx    Liver disease Neg Hx      Social History   Socioeconomic History   Marital status: Single    Spouse name: Not on file   Number of children: 0  Years of education: 10th g   Highest education level: Not on file  Occupational History   Occupation: [employer]  Tobacco Use   Smoking status: Every Day    Packs/day: 0.50    Years: 34.00    Pack years: 17.00    Types: Cigarettes   Smokeless tobacco: Never   Tobacco comments:    1/2 pack a day  Vaping Use   Vaping Use: Never used  Substance and Sexual Activity   Alcohol use: Never    Alcohol/week: 0.0 standard drinks   Drug use: Never   Sexual activity: Not Currently  Other Topics Concern   Not on file  Social History Narrative   His main caretaker, his mother has terminal metastatic cancer.   Social Determinants of Health   Financial Resource Strain: Not on file  Food Insecurity: Not on file  Transportation Needs: Not on file  Physical Activity: Not on file  Stress: Not on file  Social Connections: Not on file  Intimate Partner Violence: Not on file    Physical Exam: Vital signs in last 24 hours: @BP  108/66    Pulse (!) 107    Temp (!) 97.1 F (36.2 C) (Skin)    Ht 5\' 7"  (1.702 m)    Wt 149 lb (67.6 kg)    SpO2 100%    BMI 23.34 kg/m  GEN: NAD EYE: Sclerae anicteric ENT: MMM CV: Non-tachycardic Pulm: CTA b/l GI: Soft, NT/ND NEURO:  Alert & Oriented x 3   Gerrit Heck, DO [facility]   07/03/2021 10:27 AM

## 2021-07-03 NOTE — Progress Notes (Signed)
Called to room to assist during endoscopic procedure.  Patient ID and intended procedure confirmed with present staff. Received instructions for my participation in the procedure from the performing physician.  

## 2021-07-03 NOTE — Op Note (Signed)
[name] Procedure Date: 07/03/2021 10:22 AM MRN: [id number] Endoscopist: Gerrit Heck , MD Age: 51 Referring MD:  Date of Birth: 10-Oct-1970 Gender: Male Account #: [id number] Procedure:                Upper GI endoscopy Indications:              Reflux esophagitis, Follow-up of reflux                            esophagitis, Surveillance for malignancy due to                            personal history of Barrett's esophagus, Nausea                            with vomiting Medicines:                Monitored Anesthesia Care Procedure:                Pre-Anesthesia Assessment:                           - Prior to the procedure, a History and Physical                            was performed, and patient medications and                            allergies were reviewed. The patient's tolerance of                            previous anesthesia was also reviewed. The risks                            and benefits of the procedure and the sedation                            options and risks were discussed with the patient.                            All questions were answered, and informed consent                            was obtained. Prior Anticoagulants: The patient has                            taken no previous anticoagulant or antiplatelet                            agents. ASA Grade Assessment: III - A patient with                            severe systemic disease. After reviewing the risks  and benefits, the patient was deemed in                            satisfactory condition to undergo the procedure.                           After obtaining informed consent, the endoscope was                            passed under direct vision. Throughout the                            procedure, the patient's blood pressure, pulse, and                            oxygen saturations were monitored continuously. The                             Endoscope was introduced through the mouth, and                            advanced to the second part of duodenum. The upper                            GI endoscopy was accomplished without difficulty.                            The patient tolerated the procedure well. Scope In: Scope Out: Findings:                 There were 3 tongues of salmon colored mucosal                            present in the lower third of the esophagus,                            consistent with Barrett's Esophagus. Classified as                            Barrett's stage C0-M2 per Prague criteria The                            maximum longitudinal extent of these mucosal                            changes was 2 cm in length. Mucosa was biopsied                            with a cold forceps for histology. One specimen                            bottle was sent to pathology. Estimated blood loss  was minimal.                           LA Grade A (one or more mucosal breaks less than 5                            mm, not extending between tops of 2 mucosal folds)                            esophagitis with no bleeding was found in the lower                            third of the esophagus.                           A 1 cm sliding type hiatal hernia was present.                           The gastroesophageal flap valve was visualized                            endoscopically and classified as Hill Grade II                            (fold present, opens with respiration).                           A single 5 mm sessile polyp with no bleeding and no                            stigmata of recent bleeding was found in the                            cardia. Biopsies were taken with a cold forceps for                            histology. Estimated blood loss was minimal.                           The entire examined stomach was otherwise normal.                             Biopsies were taken with a cold forceps for                            Helicobacter pylori testing. Estimated blood loss                            was minimal.                           The examined duodenum was normal. Complications:            No immediate complications. Estimated Blood Loss:  Estimated blood loss was minimal. Impression:               - Esophageal mucosal changes classified as                            Barrett's stage C0-M2 per Prague criteria. Biopsied.                           - LA Grade A reflux esophagitis with no bleeding.                           - 1 cm sliding type hiatal hernia.                           - Gastroesophageal flap valve classified as Hill                            Grade II (fold present, opens with respiration).                           - A single gastric polyp. Biopsied.                           - Normal stomach. Biopsied.                           - Normal examined duodenum. Recommendation:           - Patient has a contact number available for                            emergencies. The signs and symptoms of potential                            delayed complications were discussed with the                            patient. Return to normal activities tomorrow.                            Written discharge instructions were provided to the                            patient.                           - Resume previous diet.                           - Continue present medications.                           - Await pathology results. Gerrit Heck, MD 07/03/2021 10:51:01 AM

## 2021-07-03 NOTE — Patient Instructions (Signed)
Handout provided on esophagitis.   Continue present medications.   YOU HAD AN ENDOSCOPIC PROCEDURE TODAY AT THE [facility]:   Refer to the procedure report that was given to you for any specific questions about what was found during the examination.  If the procedure report does not answer your questions, please call your gastroenterologist to clarify.  If you requested that your care partner not be given the details of your procedure findings, then the procedure report has been included in a sealed envelope for you to review at your convenience later.  YOU SHOULD EXPECT: Some feelings of bloating in the abdomen. Passage of more gas than usual.  Walking can help get rid of the air that was put into your GI tract during the procedure and reduce the bloating. If you had a lower endoscopy (such as a colonoscopy or flexible sigmoidoscopy) you may notice spotting of blood in your stool or on the toilet paper. If you underwent a bowel prep for your procedure, you may not have a normal bowel movement for a few days.  Please Note:  You might notice some irritation and congestion in your nose or some drainage.  This is from the oxygen used during your procedure.  There is no need for concern and it should clear up in a day or so.  SYMPTOMS TO REPORT IMMEDIATELY:  Following upper endoscopy (EGD)  Vomiting of blood or coffee ground material  New chest pain or pain under the shoulder blades  Painful or persistently difficult swallowing  New shortness of breath  Fever of 100F or higher  Black, tarry-looking stools  For urgent or emergent issues, a gastroenterologist can be reached at any hour by calling [phone]. Do not use MyChart messaging for urgent concerns.    DIET:  We do recommend a small meal at first, but then you may proceed to your regular diet.  Drink plenty of fluids but you should avoid alcoholic beverages for 24 hours.  ACTIVITY:  You should plan to take it easy for  the rest of today and you should NOT DRIVE or use heavy machinery until tomorrow (because of the sedation medicines used during the test).    FOLLOW UP: Our staff will call the number listed on your records 48-72 hours following your procedure to check on you and address any questions or concerns that you may have regarding the information given to you following your procedure. If we do not reach you, we will leave a message.  We will attempt to reach you two times.  During this call, we will ask if you have developed any symptoms of COVID 19. If you develop any symptoms (ie: fever, flu-like symptoms, shortness of breath, cough etc.) before then, please call [phone].  If you test positive for Covid 19 in the 2 weeks post procedure, please call and report this information to [place].    If any biopsies were taken you will be contacted by phone or by letter within the next 1-3 weeks.  Please call us at [phone] if you have not heard about the biopsies in 3 weeks.    SIGNATURES/CONFIDENTIALITY: You and/or your care partner have signed paperwork which will be entered into your electronic medical record.  These signatures attest to the fact that that the information above on your After Visit Summary has been reviewed and is understood.  Full responsibility of the confidentiality of this discharge information lies with you and/or your care-partner.

## 2021-07-03 NOTE — Progress Notes (Signed)
Pt's states no medical or surgical changes since previsit or office visit.   BG noted 348, Notified MD. Ordered to follow hyperglycemia protocol.  6 units of Novolog insulin administered per order. Will reassess BG

## 2021-07-06 ENCOUNTER — Ambulatory Visit (HOSPITAL_COMMUNITY): Payer: Self-pay

## 2021-07-06 ENCOUNTER — Telehealth: Payer: Self-pay

## 2021-07-06 NOTE — Telephone Encounter (Signed)
Call from pt regarding next appt, informed of appt with Denman George 07/08/2021 at 10am

## 2021-07-07 ENCOUNTER — Telehealth: Payer: Self-pay

## 2021-07-07 NOTE — Telephone Encounter (Signed)
Left message on follow up call. 

## 2021-07-07 NOTE — Telephone Encounter (Signed)
Attempted f/u call. No answer, left VM. 

## 2021-07-08 ENCOUNTER — Ambulatory Visit: Payer: Self-pay | Admitting: Licensed Clinical Social Worker

## 2021-07-08 ENCOUNTER — Other Ambulatory Visit: Payer: Self-pay

## 2021-07-08 DIAGNOSIS — F32A Depression, unspecified: Secondary | ICD-10-CM

## 2021-07-08 NOTE — Progress Notes (Signed)
[facility] engaged patient in follow-up session. [facility] provided mirroring and validation as patient processed past traumas and painful memories. [facility] led patient in progressive muscle relaxation.

## 2021-07-20 ENCOUNTER — Other Ambulatory Visit: Payer: Self-pay | Admitting: "Endocrinology

## 2021-07-21 ENCOUNTER — Other Ambulatory Visit: Payer: Self-pay

## 2021-07-21 ENCOUNTER — Ambulatory Visit: Payer: Self-pay | Admitting: Licensed Clinical Social Worker

## 2021-07-21 DIAGNOSIS — F32A Depression, unspecified: Secondary | ICD-10-CM

## 2021-07-21 NOTE — Progress Notes (Signed)
[facility] engaged patient in follow-up session. [facility] provided active listening and validation as patient shared about past trauma and losses. [facility] assisted patient in trauma processing, led patient in EFT tapping exercise to help ground patient. [facility] encouraged journaling as a coping mechanism for patient.

## 2021-07-27 ENCOUNTER — Other Ambulatory Visit: Payer: Self-pay

## 2021-07-27 ENCOUNTER — Encounter: Payer: Self-pay | Admitting: Physician Assistant

## 2021-07-27 ENCOUNTER — Ambulatory Visit: Payer: Self-pay | Admitting: Physician Assistant

## 2021-07-27 VITALS — BP 123/79 | HR 100 | Temp 98.4°F | Wt 150.0 lb

## 2021-07-27 DIAGNOSIS — E1165 Type 2 diabetes mellitus with hyperglycemia: Secondary | ICD-10-CM

## 2021-07-27 DIAGNOSIS — F32A Depression, unspecified: Secondary | ICD-10-CM

## 2021-07-27 DIAGNOSIS — R634 Abnormal weight loss: Secondary | ICD-10-CM

## 2021-07-27 DIAGNOSIS — F17200 Nicotine dependence, unspecified, uncomplicated: Secondary | ICD-10-CM

## 2021-07-27 DIAGNOSIS — K2270 Barrett's esophagus without dysplasia: Secondary | ICD-10-CM

## 2021-07-27 NOTE — Progress Notes (Signed)
BP 123/79    Pulse 100    Temp 98.4 F (36.9 C)    Wt 150 lb (68 kg)    SpO2 96%    BMI 23.49 kg/m    Subjective:    Patient ID: [name] [name], male    DOB: 1970-11-19, 51 y.o.   MRN: [id number]  HPI: [name] [name] is a 51 y.o. male presenting on 07/27/2021 for Follow-up   HPI  Pt is 50yoM with uncontrolled diabetes, CKD, dyslipidemia, CAD, barretts esophagus, depression and weight loss who presents for follow up.    He says he is doing pretty good.    He is Out most meds.  He says he called [facility] last week and they are delayed  He is Using 55 and 50 units insulin.  He checks his bs and Sometimes 180s-200s.  He has had No lows.  he is not using his metformin and glipizide now due to [facility]ist delay  Pt was a no-show for his CT scan 07/06/21 which was ordered due to his weight loss and smoking  He still smokes  He has appt with GI for endoscopy in April  He is continuing with counseling.    He has gained 5 pounds since his appointment here in January.  He is not having the dizzy/light-headedness any more.     Relevant past medical, surgical, family and social history reviewed and updated as indicated. Interim medical history since our last visit reviewed. Allergies and medications reviewed and updated.    Current Outpatient Medications:    albuterol (VENTOLIN HFA) 108 (90 Base) MCG/ACT inhaler, INHALE 2 PUFFS BY MOUTH EVERY 6 HOURS AS NEEDED FOR COUGHING, WHEEZING, OR SHORTNESS OF BREATH, Disp: 20.1 g, Rfl: 0   ARIPiprazole (ABILIFY) 10 MG tablet, TAKE 1 Tablet BY MOUTH ONCE EVERY NIGHT AT BEDTIME DOSE CHANGE, Disp: , Rfl:    ASPIRIN LOW DOSE 81 MG EC tablet, Take 81 mg by mouth every morning., Disp: , Rfl:    atorvastatin (LIPITOR) 80 MG tablet, TAKE 1 Tablet BY MOUTH ONCE EVERY DAY, Disp: 90 tablet, Rfl: 0   famotidine (PEPCID) 40 MG tablet, Take 1 tablet (40 mg total) by mouth 2 (two) times daily., Disp: 60 tablet, Rfl: 3   Fluticasone-Salmeterol (ADVAIR DISKUS  IN), Inhale 1 puff into the lungs daily. Rinse mouth after use, Disp: , Rfl:    insulin aspart protamine - aspart (NOVOLOG MIX 70/30 FLEXPEN) (70-30) 100 UNIT/ML FlexPen, Inject 50-55 Units into the skin 2 (two) times daily with a meal. Only with breakfast and supper when premeal glucose is above 90, Disp: 30 mL, Rfl: 2   metFORMIN (GLUCOPHAGE) 500 MG tablet, TAKE 1 Tablet  BY MOUTH TWICE DAILY WITH A MEAL, Disp: 180 tablet, Rfl: 0   metoCLOPramide (REGLAN) 5 MG tablet, Take 1 tablet (5 mg total) by mouth 4 (four) times daily -  before meals and at bedtime., Disp: 120 tablet, Rfl: 1   metoprolol tartrate (LOPRESSOR) 25 MG tablet, Take 0.5 tablets (12.5 mg total) by mouth 2 (two) times daily., Disp: 90 tablet, Rfl: 3   sertraline (ZOLOFT) 100 MG tablet, Take 1 tablet by mouth daily., Disp: , Rfl:    traZODone (DESYREL) 50 MG tablet, Take 50 mg by mouth at bedtime., Disp: , Rfl:    gabapentin (NEURONTIN) 100 MG capsule, Take 1 capsule (100 mg total) by mouth 3 (three) times daily as needed. (Patient not taking: Reported on 07/27/2021), Disp: 90 capsule, Rfl: 0   glipiZIDE (  GLUCOTROL XL) 5 MG 24 hr tablet, Take 1 tablet (5 mg total) by mouth daily with breakfast., Disp: 30 tablet, Rfl: 3   nitroGLYCERIN (NITROSTAT) 0.4 MG SL tablet, Place 1 tablet (0.4 mg total) under the tongue every 5 (five) minutes as needed for chest pain. (Patient not taking: Reported on 07/03/2021), Disp: 25 tablet, Rfl: 3   ondansetron (ZOFRAN-ODT) 4 MG disintegrating tablet, Take 1 tablet (4 mg total) by mouth every 8 (eight) hours as needed for nausea or vomiting. (Patient not taking: Reported on 07/03/2021), Disp: 10 tablet, Rfl: 0   polyethylene glycol (MIRALAX / GLYCOLAX) 17 g packet, Take 17 g by mouth daily. (Patient not taking: Reported on 07/27/2021), Disp: 14 each, Rfl: 0   promethazine (PHENERGAN) 25 MG suppository, Place 1 suppository (25 mg total) rectally every 8 (eight) hours as needed for refractory nausea / vomiting.  (Patient not taking: Reported on 07/03/2021), Disp: 12 each, Rfl: 0    Review of Systems  Per HPI unless specifically indicated above     Objective:    BP 123/79    Pulse 100    Temp 98.4 F (36.9 C)    Wt 150 lb (68 kg)    SpO2 96%    BMI 23.49 kg/m   Wt Readings from Last 3 Encounters:  07/27/21 150 lb (68 kg)  07/03/21 149 lb (67.6 kg)  06/25/21 149 lb (67.6 kg)    Physical Exam Vitals reviewed.  Constitutional:      General: He is not in acute distress.    Appearance: He is not toxic-appearing.  HENT:     Head: Normocephalic and atraumatic.  Cardiovascular:     Rate and Rhythm: Normal rate and regular rhythm.  Pulmonary:     Effort: Pulmonary effort is normal.     Breath sounds: Normal breath sounds. No wheezing.  Abdominal:     General: Bowel sounds are normal.     Palpations: Abdomen is soft.     Tenderness: There is no abdominal tenderness.  Musculoskeletal:     Cervical back: Neck supple.     Right lower leg: No edema.     Left lower leg: No edema.  Lymphadenopathy:     Cervical: No cervical adenopathy.  Skin:    General: Skin is warm and dry.  Neurological:     Mental Status: He is alert and oriented to person, place, and time.  Psychiatric:        Behavior: Behavior normal.          Assessment & Plan:     Encounter Diagnoses  Name Primary?   Depression, unspecified depression type Yes   Weight loss    Tobacco use disorder    Uncontrolled type 2 diabetes mellitus with hyperglycemia (HCC)    Barrett's esophagus without dysplasia      -Will follow up with pt in about 6 weeks after his CT scan which was rescheduled.  He is to contract office sooner prn -Pt to contact office if his meds don't arrive from [facility] within a week.

## 2021-07-28 ENCOUNTER — Other Ambulatory Visit: Payer: Self-pay | Admitting: Physician Assistant

## 2021-08-04 ENCOUNTER — Ambulatory Visit: Payer: Self-pay | Admitting: Licensed Clinical Social Worker

## 2021-08-04 ENCOUNTER — Other Ambulatory Visit: Payer: Self-pay

## 2021-08-04 DIAGNOSIS — F32A Depression, unspecified: Secondary | ICD-10-CM

## 2021-08-04 NOTE — Progress Notes (Signed)
[facility] engaged patient in follow-up session. [facility] provided active listening and validation as patient shared about past traumas and grief. [facility] led patient in visualization breathing exercise. ?

## 2021-08-14 NOTE — Congregational Nurse Program (Signed)
?  Dept: [phone] ? ? ?Congregational Nurse Program Note ? ?Date of Encounter: 08/14/2021 ? ?Past Medical History: ?Past Medical History:  ?Diagnosis Date  ? Anxiety   ? Arthritis   ? Asthma   ? CAD (coronary artery disease)   ? Moderate LAD disease 2016 - Dr. Jacinto Halim  ? Chronic bronchitis (HCC)   ? Chronic upper back pain   ? COPD (chronic obstructive pulmonary disease) (HCC)   ? Depression   ? Diabetic peripheral neuropathy (HCC)   ? GERD (gastroesophageal reflux disease)   ? History of gout   ? Hyperlipemia   ? Hypertension   ? Migraine   ? Noncompliance   ? Ringing in the ears, bilateral   ? Sleep apnea 2016  ? "could not afford CPAP".  ? Type 2 diabetes mellitus (HCC)   ? ? ?Encounter Details: ? CNP Questionnaire - 08/14/21 1247   ? ?  ? Questionnaire  ? Do you give verbal consent to treat you today? Yes   ? Location Patient Served  [name]   ? Visit Setting Church or Organization   ? Patient Status Unknown   ? Insurance Uninsured ([place] [employer]/[facility]/Self-Pay)   ? Insurance Referral N/A   ? Medication N/A   ? Medical Provider Yes   ? Screening Referrals N/A   ? Medical Referral N/A   ? Medical Appointment Made N/A   ? Food N/A   ? Transportation N/A   ? Housing/Utilities N/A   ? Interpersonal Safety N/A   ? Intervention Support;Educate;Case Management   ? ED Visit Averted N/A   ? Life-Saving Intervention Made N/A   ? ?  ?  ? ?  ? ? ?client by [facility] concerned he will not be eligible for CAFA( [facility] financial assistance) because he received his [employer] FA  (financial assistance) approval letter in the mail, discussed with client that we apply for the [facility] FA for everyone that enrolls or renews and to keep his letter as that is good for one year and it tells what his copayments would be in case his provider at  [facility] t would need to send him to a specialist in the [facility] that [facility] does not provide. ?He has his CAFA letter and client has CAFA until 09/07/21 reminded client that he  cannot apply until 09/08/21. Asked client to check back with [place] that week of the 09/07/21 and we could remind him what is needed for CAFA such as LOS etc. He states understandng. client asked should he apply for Medicaid again. discussed he could try applying again, and if he receives a denial letter to keep it for his renewal. ?Asked client if he wanted to shop the food market today and he states "no we are good" client reports BS this am 195 which is down from 200s. He reports taking all of his medications as directed and trying to eat less sandwiches and baking meals in the oven more. Client has declined offer of the [employer] DM program through [facility] at this time. Will continue to offer client the opportunity for exercise. ? ?[name] RN ?Clara Gunn/[facility] ? ?

## 2021-08-18 ENCOUNTER — Ambulatory Visit: Payer: Self-pay | Admitting: Licensed Clinical Social Worker

## 2021-08-18 ENCOUNTER — Other Ambulatory Visit: Payer: Self-pay

## 2021-08-18 DIAGNOSIS — F32A Depression, unspecified: Secondary | ICD-10-CM

## 2021-08-18 NOTE — Progress Notes (Signed)
[facility] engaged patient in follow-up session. [facility] provided active listening and validation as patient shared about current stressors and interpersonal conflict within family. [facility] led patient in visualization breathing technique.  ?

## 2021-08-31 ENCOUNTER — Other Ambulatory Visit: Payer: Self-pay | Admitting: Physician Assistant

## 2021-08-31 DIAGNOSIS — Z125 Encounter for screening for malignant neoplasm of prostate: Secondary | ICD-10-CM

## 2021-08-31 DIAGNOSIS — E1165 Type 2 diabetes mellitus with hyperglycemia: Secondary | ICD-10-CM

## 2021-08-31 DIAGNOSIS — N189 Chronic kidney disease, unspecified: Secondary | ICD-10-CM

## 2021-09-01 ENCOUNTER — Ambulatory Visit: Payer: Self-pay | Admitting: Licensed Clinical Social Worker

## 2021-09-01 DIAGNOSIS — F32A Depression, unspecified: Secondary | ICD-10-CM

## 2021-09-01 NOTE — Progress Notes (Signed)
[facility] engaged patient in follow-up appointment. [facility] provided active listening and validation as patient shared about interpersonal conflict within family. [facility] led patient in 4-7-8 breathing exercise. Patient presented with passive SI. [facility] led patient in safety contracting and planning.  ?

## 2021-09-08 ENCOUNTER — Ambulatory Visit (INDEPENDENT_AMBULATORY_CARE_PROVIDER_SITE_OTHER): Payer: Self-pay | Admitting: "Endocrinology

## 2021-09-08 ENCOUNTER — Encounter: Payer: Self-pay | Admitting: "Endocrinology

## 2021-09-08 VITALS — BP 108/68 | HR 88 | Ht 67.0 in | Wt 152.6 lb

## 2021-09-08 DIAGNOSIS — E1165 Type 2 diabetes mellitus with hyperglycemia: Secondary | ICD-10-CM

## 2021-09-08 DIAGNOSIS — Z91199 Patient's noncompliance with other medical treatment and regimen due to unspecified reason: Secondary | ICD-10-CM

## 2021-09-08 DIAGNOSIS — F17200 Nicotine dependence, unspecified, uncomplicated: Secondary | ICD-10-CM

## 2021-09-08 DIAGNOSIS — E782 Mixed hyperlipidemia: Secondary | ICD-10-CM

## 2021-09-08 LAB — POCT GLYCOSYLATED HEMOGLOBIN (HGB A1C): HbA1c, POC (controlled diabetic range): 13.2 % — AB (ref 0.0–7.0)

## 2021-09-08 MED ORDER — NOVOLOG MIX 70/30 FLEXPEN (70-30) 100 UNIT/ML ~~LOC~~ SUPN: 50.0000 [IU] | PEN_INJECTOR | Freq: Two times a day (BID) | SUBCUTANEOUS | 2 refills | Status: DC

## 2021-09-08 NOTE — Patient Instructions (Signed)

## 2021-09-08 NOTE — Progress Notes (Signed)
? ?                                                      ?     09/08/2021, 4:00 PM ? ?Endocrinology follow-up note ? ? ?Subjective:  ? ? Patient ID: [name] [name], male    DOB: March 05, 1971.  ?Ova L Carley is being seen in follow-up after he was seen in consultation for management of currently uncontrolled symptomatic diabetes requested by  [name], PA-C. ? ? ?Past Medical History:  ?Diagnosis Date  ? Anxiety   ? Arthritis   ? Asthma   ? CAD (coronary artery disease)   ? Moderate LAD disease 2016 - Dr. Jacinto Halim  ? Chronic bronchitis (HCC)   ? Chronic upper back pain   ? COPD (chronic obstructive pulmonary disease) (HCC)   ? Depression   ? Diabetic peripheral neuropathy (HCC)   ? GERD (gastroesophageal reflux disease)   ? History of gout   ? Hyperlipemia   ? Hypertension   ? Migraine   ? Noncompliance   ? Ringing in the ears, bilateral   ? Sleep apnea 2016  ? "could not afford CPAP".  ? Type 2 diabetes mellitus (HCC)   ? ? ?Past Surgical History:  ?Procedure Laterality Date  ? ANKLE SURGERY Right 1982  ? "had extra bones in there; took them out"  ? APPENDECTOMY  1975  ? BIOPSY  12/26/2018  ? Procedure: BIOPSY;  Surgeon: Malissa Hippo, MD;  [facility];  Service: Endoscopy;;  duodenal biopsies  ? BIOPSY  03/23/2019  ? Procedure: BIOPSY;  Surgeon: Malissa Hippo, MD;  Location: [facility];  Service: Endoscopy;;  esophagus  ? CARDIAC CATHETERIZATION N/A 03/28/2015  ? Procedure: Left Heart Cath and Coronary Angiography;  Surgeon: Yates Decamp, MD;  Location: [facility] INVASIVE CV LAB;  Service: Cardiovascular;  Laterality: N/A;  ? CARDIAC CATHETERIZATION N/A 03/28/2015  ? Procedure: Intravascular Pressure Wire/FFR Study;  Surgeon: Yates Decamp, MD;  Location: [facility] INVASIVE CV LAB;  Service: Cardiovascular;  Laterality: N/A;  ? CARPAL TUNNEL RELEASE Left ~ 2008  ? COLONOSCOPY WITH ESOPHAGOGASTRODUODENOSCOPY (EGD)    ? COLONOSCOPY WITH PROPOFOL N/A 12/24/2019  ? Procedure: COLONOSCOPY WITH  PROPOFOL;  Surgeon: Malissa Hippo, MD;  Location: [facility];  Service: Endoscopy;  Laterality: N/A;  955  ? ELBOW FRACTURE SURGERY Left ~ 2008  ? ESOPHAGOGASTRODUODENOSCOPY N/A 12/26/2018  ? Procedure: ESOPHAGOGASTRODUODENOSCOPY (EGD);  Surgeon: Malissa Hippo, MD;  [facility]: [facility];  Service: Endoscopy;  Laterality: N/A;  ? ESOPHAGOGASTRODUODENOSCOPY (EGD) WITH PROPOFOL N/A 03/23/2019  ? Procedure: ESOPHAGOGASTRODUODENOSCOPY (EGD) WITH PROPOFOL;  Surgeon: Malissa Hippo, MD;  Location: [facility];  Service: Endoscopy;  Laterality: N/A;  7:30  ? FRACTURE SURGERY    ? ankle and elbow  ? PILONIDAL CYST EXCISION N/A 03/24/2017  ? Procedure: EXCISION CHRONIC  PILONIDAL ABSCESS;  Surgeon: [name] Miyamoto, MD;  Location: [facility];  Service: General;  Laterality: N/A;  ? SCROTAL EXPLORATION N/A 11/13/2020  ? Procedure: EXCISION OF SEBACEOUS CYSTS, SCROTUM;  Surgeon: Malen Gauze, MD;  Location: [facility];  Service: Urology;  Laterality: N/A;  ? TENDON REPAIR Left ~ 2004  ? "main tendon in my ankle"  ? ? ?Social History  ? ?Socioeconomic History  ? Marital status: Single  ?  Spouse name: Not on file  ? Number of children: 0  ? Years of education: 10th g  ? Highest education level: Not on file  ?Occupational History  ? 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 Smoking status: Every Day  ?  Packs/day: 0.50  ?  Years: 34.00  ?  Pack years: 17.00  ?  Types: Cigarettes  ? Smokeless tobacco: Never  ? Tobacco comments:  ?  1/2 pack a day  ?Vaping Use  ? Vaping Use: Never used  ?Substance and Sexual Activity  ? Alcohol use: Never  ?  Alcohol/week: 0.0 standard drinks  ? Drug use: Never  ? Sexual activity: Not Currently  ?Other Topics Concern  ? Not on file  ?Social History Narrative  ? His main caretaker, his mother has terminal metastatic cancer.  ? ?Social Determinants of Health  ? ?Financial Resource Strain: Not on file  ?Food Insecurity: Not on file  ?Transportation Needs: Not on file  ?Physical  Activity: Not on file  ?Stress: Not on file  ?Social Connections: Not on file  ? ? ?Family History  ?Problem Relation Age of Onset  ? Diabetes Mother   ? Hypertension Mother   ? Cancer Mother   ? Throat cancer Mother   ? Diabetes Father   ? Hypertension Father   ? Hyperlipidemia Father   ? Congestive Heart Failure Sister   ? Colon cancer Neg Hx   ? Pancreatic cancer Neg Hx   ? Liver disease Neg Hx   ? ? ?Outpatient Encounter Medications as of 09/08/2021  ?Medication Sig  ? albuterol (VENTOLIN HFA) 108 (90 Base) MCG/ACT inhaler INHALE 2 PUFFS BY MOUTH EVERY 6 HOURS AS NEEDED FOR COUGHING, WHEEZING, OR SHORTNESS OF BREATH  ? ARIPiprazole (ABILIFY) 10 MG tablet TAKE 1 Tablet BY MOUTH ONCE EVERY NIGHT AT BEDTIME DOSE CHANGE  ? ASPIRIN LOW DOSE 81 MG EC tablet Take 81 mg by mouth every morning.  ? atorvastatin (LIPITOR) 80 MG tablet TAKE 1 Tablet BY MOUTH ONCE EVERY DAY  ? famotidine (PEPCID) 40 MG tablet Take 1 tablet (40 mg total) by mouth 2 (two) times daily.  ? Fluticasone-Salmeterol (ADVAIR DISKUS IN) Inhale 1 puff into the lungs daily. Rinse mouth after use  ? gabapentin (NEURONTIN) 100 MG capsule TAKE 1 CAPSULE BY MOUTH THREE TIMES A DAY AS NEEDED  ? glipiZIDE (GLUCOTROL XL) 5 MG 24 hr tablet Take 1 tablet (5 mg total) by mouth daily with breakfast.  ? insulin aspart protamine - aspart (NOVOLOG MIX 70/30 FLEXPEN) (70-30) 100 UNIT/ML FlexPen Inject 50-60 Units into the skin 2 (two) times daily with a meal. Only with breakfast and supper when premeal glucose is above 90  ? metFORMIN (GLUCOPHAGE) 500 MG tablet TAKE 1 Tablet  BY MOUTH TWICE DAILY WITH A MEAL  ? metoCLOPramide (REGLAN) 5 MG tablet Take 1 tablet (5 mg total) by mouth 4 (four) times daily -  before meals and at bedtime.  ? metoprolol tartrate (LOPRESSOR) 25 MG tablet Take 0.5 tablets (12.5 mg total) by mouth 2 (two) times daily.  ? nitroGLYCERIN (NITROSTAT) 0.4 MG SL tablet Place 1 tablet (0.4 mg total) under the tongue every 5 (five) minutes as needed  for chest pain. (Patient not taking: Reported on 07/03/2021)  ? ondansetron (ZOFRAN-ODT) 4 MG disintegrating tablet Take 1 tablet (4 mg total) by mouth every 8 (eight) hours as needed for nausea or vomiting. (Patient not taking: Reported on 07/03/2021)  ? polyethylene glycol (MIRALAX /  GLYCOLAX) 17 g packet Take 17 g by mouth daily. (Patient not taking: Reported on 07/27/2021)  ? promethazine (PHENERGAN) 25 MG suppository Place 1 suppository (25 mg total) rectally every 8 (eight) hours as needed for refractory nausea / vomiting. (Patient not taking: Reported on 07/03/2021)  ? sertraline (ZOLOFT) 100 MG tablet Take 1 tablet by mouth daily.  ? traZODone (DESYREL) 50 MG tablet Take 50 mg by mouth at bedtime.  ? [DISCONTINUED] insulin aspart protamine - aspart (NOVOLOG MIX 70/30 FLEXPEN) (70-30) 100 UNIT/ML FlexPen Inject 50-55 Units into the skin 2 (two) times daily with a meal. Only with breakfast and supper when premeal glucose is above 90  ? ?No facility-administered encounter medications on file as of 09/08/2021.  ? ? ?ALLERGIES: ?Allergies  ?Allergen Reactions  ? Omeprazole Magnesium Swelling  ?  Face swells, no breathing impairment  ? Bee Venom   ? Esomeprazole Swelling  ?  Face swells, no breathing impairment  ? Morphine And Related   ?  Itching at IV site, dry heaving, burning sensation in throat.  ? ? ?VACCINATION STATUS: ?Immunization History  ?Administered Date(s) Administered  ? Influenza-Unspecified 03/13/2019  ? Moderna Sars-Covid-2 Vaccination 09/11/2019, 10/11/2019  ? Pneumococcal Polysaccharide-23 05/25/2017  ? ? ?Diabetes ?He presents for his follow-up diabetic visit. He has type 2 diabetes mellitus. Onset time: He was diagnosed at approximate age of 39 years. His disease course has been worsening. There are no hypoglycemic associated symptoms. Pertinent negatives for hypoglycemia include no confusion, headaches, pallor or seizures. Associated symptoms include polydipsia and polyuria. Pertinent negatives for  diabetes include no chest pain, no fatigue, no polyphagia and no weakness. There are no hypoglycemic complications. Symptoms are worsening. Diabetic complications include heart disease. Risk factors for cor

## 2021-09-09 ENCOUNTER — Other Ambulatory Visit: Payer: Self-pay | Admitting: Physician Assistant

## 2021-09-10 ENCOUNTER — Encounter (HOSPITAL_COMMUNITY): Payer: Self-pay | Admitting: Gastroenterology

## 2021-09-10 ENCOUNTER — Ambulatory Visit (HOSPITAL_COMMUNITY)
Admission: RE | Admit: 2021-09-10 | Discharge: 2021-09-10 | Disposition: A | Discharge status: Home / Self Care | Payer: Self-pay | Source: Ambulatory Visit | Attending: Physician Assistant | Admitting: Physician Assistant

## 2021-09-10 DIAGNOSIS — R0600 Dyspnea, unspecified: Secondary | ICD-10-CM | POA: Insufficient documentation

## 2021-09-10 DIAGNOSIS — F17200 Nicotine dependence, unspecified, uncomplicated: Secondary | ICD-10-CM | POA: Insufficient documentation

## 2021-09-10 DIAGNOSIS — R634 Abnormal weight loss: Secondary | ICD-10-CM | POA: Insufficient documentation

## 2021-09-15 ENCOUNTER — Ambulatory Visit: Payer: Self-pay | Admitting: Licensed Clinical Social Worker

## 2021-09-15 ENCOUNTER — Other Ambulatory Visit (HOSPITAL_COMMUNITY)
Admission: RE | Admit: 2021-09-15 | Discharge: 2021-09-15 | Disposition: A | Discharge status: Home / Self Care | Payer: Self-pay | Source: Ambulatory Visit | Attending: Physician Assistant | Admitting: Physician Assistant

## 2021-09-15 ENCOUNTER — Ambulatory Visit: Payer: Self-pay | Admitting: Physician Assistant

## 2021-09-15 ENCOUNTER — Encounter: Payer: Self-pay | Admitting: Physician Assistant

## 2021-09-15 VITALS — BP 146/95 | HR 98 | Temp 96.3°F | Wt 149.0 lb

## 2021-09-15 DIAGNOSIS — F32A Depression, unspecified: Secondary | ICD-10-CM

## 2021-09-15 DIAGNOSIS — F17200 Nicotine dependence, unspecified, uncomplicated: Secondary | ICD-10-CM

## 2021-09-15 DIAGNOSIS — Z125 Encounter for screening for malignant neoplasm of prostate: Secondary | ICD-10-CM | POA: Insufficient documentation

## 2021-09-15 DIAGNOSIS — N189 Chronic kidney disease, unspecified: Secondary | ICD-10-CM

## 2021-09-15 DIAGNOSIS — E1165 Type 2 diabetes mellitus with hyperglycemia: Secondary | ICD-10-CM

## 2021-09-15 DIAGNOSIS — T1490XA Injury, unspecified, initial encounter: Secondary | ICD-10-CM

## 2021-09-15 DIAGNOSIS — K2270 Barrett's esophagus without dysplasia: Secondary | ICD-10-CM

## 2021-09-15 LAB — COMPREHENSIVE METABOLIC PANEL
ALT: 14 U/L (ref 0–44)
AST: 15 U/L (ref 15–41)
Albumin: 3.6 g/dL (ref 3.5–5.0)
Alkaline Phosphatase: 84 U/L (ref 38–126)
Anion gap: 10 (ref 5–15)
BUN: 19 mg/dL (ref 6–20)
CO2: 26 mmol/L (ref 22–32)
Calcium: 8.9 mg/dL (ref 8.9–10.3)
Chloride: 99 mmol/L (ref 98–111)
Creatinine, Ser: 1.24 mg/dL (ref 0.61–1.24)
GFR, Estimated: >60 mL/min (ref >60)
Glucose, Bld: 403 mg/dL — ABNORMAL HIGH (ref 70–99)
Potassium: 4.8 mmol/L (ref 3.5–5.1)
Sodium: 135 mmol/L (ref 135–145)
Total Bilirubin: 0.9 mg/dL (ref 0.3–1.2)
Total Protein: 7.2 g/dL (ref 6.5–8.1)

## 2021-09-15 LAB — LIPID PANEL
Cholesterol: 259 mg/dL — ABNORMAL HIGH (ref 0–200)
HDL: 41 mg/dL (ref >40)
LDL Cholesterol: 181 mg/dL — ABNORMAL HIGH (ref 0–99)
Total CHOL/HDL Ratio: 6.3 RATIO
Triglycerides: 187 mg/dL — ABNORMAL HIGH (ref <150)
VLDL: 37 mg/dL (ref 0–40)

## 2021-09-15 LAB — PSA: Prostatic Specific Antigen: 0.52 ng/mL (ref 0.00–4.00)

## 2021-09-15 NOTE — Progress Notes (Signed)
? ?BP (!) 146/95   Pulse 98   Temp (!) 96.3 ?F (35.7 ?C)   Wt 149 lb (67.6 kg)   SpO2 98%   BMI 23.34 kg/m?   ? ?Subjective:  ? ? Patient ID: [name] [name], male    DOB: 08/25/1970, 51 y.o.   MRN: [id number] ? ?HPI: ?[name] [name] is a 51 y.o. male presenting on 09/15/2021 for No chief complaint on file. ? ? ?HPI ? ? ?Pt is 89yoM with uncontrolled diabetes, CKD, dyslipidemia, CAD, barretts esophagus, depression and weight loss who presents for follow up.   ?  ? ? ?He has esophageal Nature conservation officer. ? ?Pt says he is Feeling "not too bad, a little drained."  ? ?Pt did not get his labs drawn. ? ?He says he is eating. ? ?He says his cafa ran out.    He renewed it but hasn't heard if he got approved yet.   ? ?Pt says his Getting a bill is why he didn't get his labs done.   ? ? ? ? ?Relevant past medical, surgical, family and social history reviewed and updated as indicated. Interim medical history since our last visit reviewed. ?Allergies and medications reviewed and updated. ? ? ?Current Outpatient Medications:  ?  ASPIRIN LOW DOSE 81 MG EC tablet, Take 81 mg by mouth every morning., Disp: , Rfl:  ?  insulin aspart protamine - aspart (NOVOLOG MIX 70/30 FLEXPEN) (70-30) 100 UNIT/ML FlexPen, Inject 50-60 Units into the skin 2 (two) times daily with a meal. Only with breakfast and supper when premeal glucose is above 90, Disp: 30 mL, Rfl: 2 ?  metFORMIN (GLUCOPHAGE) 500 MG tablet, TAKE 1 Tablet  BY MOUTH TWICE DAILY WITH A MEAL, Disp: 180 tablet, Rfl: 0 ?  albuterol (VENTOLIN HFA) 108 (90 Base) MCG/ACT inhaler, INHALE 2 PUFFS BY MOUTH EVERY 6 HOURS AS NEEDED FOR COUGHING, WHEEZING, OR SHORTNESS OF BREATH, Disp: 20.1 g, Rfl: 0 ?  ARIPiprazole (ABILIFY) 10 MG tablet, TAKE 1 Tablet BY MOUTH ONCE EVERY NIGHT AT BEDTIME DOSE CHANGE, Disp: , Rfl:  ?  atorvastatin (LIPITOR) 80 MG tablet, TAKE 1 Tablet BY MOUTH ONCE EVERY DAY, Disp: 90 tablet, Rfl: 0 ?  famotidine (PEPCID) 40 MG tablet, Take 1 tablet (40 mg total) by  mouth 2 (two) times daily., Disp: 60 tablet, Rfl: 3 ?  Fluticasone-Salmeterol (ADVAIR DISKUS IN), Inhale 1 puff into the lungs daily. Rinse mouth after use, Disp: , Rfl:  ?  gabapentin (NEURONTIN) 100 MG capsule, TAKE 1 CAPSULE BY MOUTH THREE TIMES A DAY AS NEEDED, Disp: 90 capsule, Rfl: 0 ?  glipiZIDE (GLUCOTROL XL) 5 MG 24 hr tablet, Take 1 tablet (5 mg total) by mouth daily with breakfast., Disp: 30 tablet, Rfl: 3 ?  metoCLOPramide (REGLAN) 5 MG tablet, Take 1 tablet (5 mg total) by mouth 4 (four) times daily -  before meals and at bedtime., Disp: 120 tablet, Rfl: 1 ?  metoprolol tartrate (LOPRESSOR) 25 MG tablet, Take 0.5 tablets (12.5 mg total) by mouth 2 (two) times daily., Disp: 90 tablet, Rfl: 3 ?  nitroGLYCERIN (NITROSTAT) 0.4 MG SL tablet, Place 1 tablet (0.4 mg total) under the tongue every 5 (five) minutes as needed for chest pain. (Patient not taking: Reported on 07/03/2021), Disp: 25 tablet, Rfl: 3 ?  ondansetron (ZOFRAN-ODT) 4 MG disintegrating tablet, Take 1 tablet (4 mg total) by mouth every 8 (eight) hours as needed for nausea or vomiting. (Patient not taking: Reported on 07/03/2021), Disp: 10 tablet,  Rfl: 0 ?  polyethylene glycol (MIRALAX / GLYCOLAX) 17 g packet, Take 17 g by mouth daily. (Patient not taking: Reported on 07/27/2021), Disp: 14 each, Rfl: 0 ?  promethazine (PHENERGAN) 25 MG suppository, Place 1 suppository (25 mg total) rectally every 8 (eight) hours as needed for refractory nausea / vomiting. (Patient not taking: Reported on 07/03/2021), Disp: 12 each, Rfl: 0 ?  sertraline (ZOLOFT) 100 MG tablet, Take 1 tablet by mouth daily., Disp: , Rfl:  ?  traZODone (DESYREL) 50 MG tablet, Take 50 mg by mouth at bedtime., Disp: , Rfl:  ? ? ?Review of Systems ? ?Per HPI unless specifically indicated above ? ?   ?Objective:  ?  ?BP (!) 146/95   Pulse 98   Temp (!) 96.3 ?F (35.7 ?C)   Wt 149 lb (67.6 kg)   SpO2 98%   BMI 23.34 kg/m?   ?Wt Readings from Last 3 Encounters:  ?09/15/21 149 lb (67.6 kg)   ?09/08/21 152 lb 9.6 oz (69.2 kg)  ?07/27/21 150 lb (68 kg)  ?  ?Physical Exam ?Vitals reviewed.  ?Constitutional:   ?   General: He is not in acute distress. ?   Appearance: He is well-developed. He is not toxic-appearing.  ?HENT:  ?   Head: Normocephalic and atraumatic.  ?Cardiovascular:  ?   Rate and Rhythm: Normal rate and regular rhythm.  ?Pulmonary:  ?   Effort: Pulmonary effort is normal.  ?   Breath sounds: Normal breath sounds. No wheezing.  ?Abdominal:  ?   General: Bowel sounds are normal.  ?   Palpations: Abdomen is soft.  ?   Tenderness: There is no abdominal tenderness.  ?Musculoskeletal:  ?   Cervical back: Neck supple.  ?   Right lower leg: No edema.  ?   Left lower leg: No edema.  ?Lymphadenopathy:  ?   Cervical: No cervical adenopathy.  ?Skin: ?   General: Skin is warm and dry.  ?Neurological:  ?   Mental Status: He is alert and oriented to person, place, and time.  ?Psychiatric:     ?   Attention and Perception: Attention normal.     ?   Behavior: Behavior normal. Behavior is cooperative.  ? ? ? ? ? ? ?   ?Assessment & Plan:  ? ?Encounter Diagnoses  ?Name Primary?  ? Depression, unspecified depression type Yes  ? Uncontrolled type 2 diabetes mellitus with hyperglycemia ([place])   ? Chronic kidney disease, unspecified CKD stage   ? Tobacco use disorder   ? Barrett's esophagus without dysplasia   ? ? ? ? ?-reviewed results CT chest-  ?-Pt encouraged to get fasting labs drawn ?-esophageal manometry as scheduled ?-pt to continue with counseling ?-discussed with pt that weight loss likely due to uncontrolled DM and possibly depression.   Work-up for other causes has been unfruitful ?-follow up 3 month.  He is encouraged to eat, stop smoking, get fasting labs drawn.  He is to RTO sooner prn ? ? ?

## 2021-09-15 NOTE — Progress Notes (Signed)
[facility] engaged patient in follow-up session. [facility] provided active listening and validation as patient shared about frustrations and disappointments with family. Therapist assisted patient in processing thoughts and feelings related to past trauma. [facility] led patient in breathing and tapping exercises to help patient regulate his emotions during the trauma processing. ?

## 2021-09-16 ENCOUNTER — Encounter (HOSPITAL_COMMUNITY): Payer: Self-pay | Admitting: Gastroenterology

## 2021-09-16 ENCOUNTER — Encounter (HOSPITAL_COMMUNITY)
Admission: RE | Disposition: A | Discharge status: Home / Self Care | Payer: Self-pay | Source: Home / Self Care | Attending: Gastroenterology

## 2021-09-16 ENCOUNTER — Ambulatory Visit (HOSPITAL_COMMUNITY)
Admission: RE | Admit: 2021-09-16 | Discharge: 2021-09-16 | Disposition: A | Discharge status: Home / Self Care | Payer: Self-pay | Attending: Gastroenterology | Admitting: Gastroenterology

## 2021-09-16 DIAGNOSIS — R12 Heartburn: Secondary | ICD-10-CM

## 2021-09-16 DIAGNOSIS — K219 Gastro-esophageal reflux disease without esophagitis: Secondary | ICD-10-CM

## 2021-09-16 HISTORY — PX: ESOPHAGEAL MANOMETRY: SHX5429

## 2021-09-16 HISTORY — PX: PH IMPEDANCE STUDY: SHX5565

## 2021-09-16 SURGERY — MANOMETRY, ESOPHAGUS

## 2021-09-16 MED ORDER — LIDOCAINE VISCOUS HCL 2 % MT SOLN
OROMUCOSAL | Status: AC
  Filled 2021-09-16: qty 15

## 2021-09-16 SURGICAL SUPPLY — 2 items
FACESHIELD LNG OPTICON STERILE (SAFETY) IMPLANT
GLOVE BIO SURGEON STRL SZ8 (GLOVE) ×6 IMPLANT

## 2021-09-16 NOTE — Progress Notes (Signed)
Esophageal Manometry done per protocol. Pt tolerated well with out complication. Ph with impedance done per protocol. Pt tolerated well. Instructions given regarding the study and monitor. Pt verbalized understand and return demonstrated use of monitor. Pt will return tomorrow to have probe removed and monitor downloaded.  

## 2021-09-17 LAB — MICROALBUMIN, URINE: Microalb, Ur: 990.3 ug/mL — ABNORMAL HIGH

## 2021-09-25 DIAGNOSIS — R12 Heartburn: Secondary | ICD-10-CM

## 2021-09-29 ENCOUNTER — Ambulatory Visit: Payer: Self-pay | Admitting: Licensed Clinical Social Worker

## 2021-09-29 DIAGNOSIS — F32A Depression, unspecified: Secondary | ICD-10-CM

## 2021-09-29 NOTE — Progress Notes (Signed)
[facility] engaged patient in follow-up appointment. [facility] provided active listening and validation as patient shared about loneliness, interpersonal conflict, and past traumas. [facility] helped patient identify thoughts and feelings related to past traumatic experiences.  ?

## 2021-10-13 ENCOUNTER — Ambulatory Visit: Payer: Self-pay | Admitting: Licensed Clinical Social Worker

## 2021-10-13 DIAGNOSIS — T1490XA Injury, unspecified, initial encounter: Secondary | ICD-10-CM

## 2021-10-13 DIAGNOSIS — F32A Depression, unspecified: Secondary | ICD-10-CM

## 2021-10-13 NOTE — Progress Notes (Signed)
[facility] engaged patient in follow-up session. [facility] provided reflective listening and validation as patient shared about acceptance related to his sexuality and pain regarding past trauma. [facility] reviewed EFT tapping exercise with patient. ?

## 2021-10-20 ENCOUNTER — Telehealth: Payer: Self-pay

## 2021-10-20 NOTE — Telephone Encounter (Signed)
Client called inquiring about his [facility] Financial assistance application. Discussed with D. Leavy Cella. Application has been submitted last month however has not been reviewed and approved to date. Reminded client that it has been taking at least 30 days or more to be reviewed. Discussed letting those calling for bills know he is awaiting his application to be approved.  Client is picking up insulin today from [facility], he will come by [name] and we will supply a [employer] meter, 50 strips and lancets. Client states he has log sheets.  Will continue conversation with client when in office. He will also access [facility] [employer] today when he comes.  [name] RN [employer]

## 2021-10-22 ENCOUNTER — Other Ambulatory Visit: Payer: Self-pay

## 2021-10-22 ENCOUNTER — Emergency Department (HOSPITAL_COMMUNITY)
Admission: EM | Admit: 2021-10-22 | Discharge: 2021-10-22 | Disposition: A | Discharge status: Home / Self Care | Payer: Self-pay | Attending: Emergency Medicine | Admitting: Emergency Medicine

## 2021-10-22 ENCOUNTER — Encounter (HOSPITAL_COMMUNITY): Payer: Self-pay | Admitting: *Deleted

## 2021-10-22 DIAGNOSIS — R208 Other disturbances of skin sensation: Secondary | ICD-10-CM | POA: Insufficient documentation

## 2021-10-22 DIAGNOSIS — Z79899 Other long term (current) drug therapy: Secondary | ICD-10-CM | POA: Insufficient documentation

## 2021-10-22 DIAGNOSIS — M79671 Pain in right foot: Secondary | ICD-10-CM | POA: Insufficient documentation

## 2021-10-22 DIAGNOSIS — I739 Peripheral vascular disease, unspecified: Secondary | ICD-10-CM | POA: Insufficient documentation

## 2021-10-22 DIAGNOSIS — Z7982 Long term (current) use of aspirin: Secondary | ICD-10-CM | POA: Insufficient documentation

## 2021-10-22 DIAGNOSIS — F17210 Nicotine dependence, cigarettes, uncomplicated: Secondary | ICD-10-CM | POA: Insufficient documentation

## 2021-10-22 DIAGNOSIS — Z7984 Long term (current) use of oral hypoglycemic drugs: Secondary | ICD-10-CM | POA: Insufficient documentation

## 2021-10-22 MED ORDER — LIDOCAINE 5 % EX PTCH
1.0000 | MEDICATED_PATCH | CUTANEOUS | Status: DC
  Administered 2021-10-22: 1 via TRANSDERMAL
  Filled 2021-10-22: qty 1

## 2021-10-22 MED ORDER — LIDOCAINE 5 % EX PTCH: 1.0000 | MEDICATED_PATCH | CUTANEOUS | 0 refills | Status: AC

## 2021-10-22 MED ORDER — GABAPENTIN 300 MG PO CAPS: 300.0000 mg | ORAL_CAPSULE | Freq: Three times a day (TID) | ORAL | 0 refills | Status: DC | PRN

## 2021-10-22 MED ORDER — GABAPENTIN 300 MG PO CAPS
300.0000 mg | ORAL_CAPSULE | Freq: Once | ORAL | Status: AC
  Administered 2021-10-22: 300 mg via ORAL
  Filled 2021-10-22: qty 1

## 2021-10-22 NOTE — ED Provider Notes (Signed)
[facility] EMERGENCY DEPARTMENT Provider Note   CSN: [id number] Arrival date & time: 10/22/21  [id number]     History  Chief Complaint  Patient presents with   Foot Pain    [name] [name] is a 51 y.o. male presenting to the ED with right foot pain.  He has chronic pain in his hands and feet, particularly at night rates as his hands and feet feel very cold and cramping.  He reports that his right foot seem particularly hypersensitive and painful last night.  He says it helps him to put on socks and gloves to cover up his skin.  He takes gabapentin 100 mg twice a day.  He does smoke.  He has a family history of peripheral vascular disease but he himself has not been worked up for that.  HPI     Home Medications Prior to Admission medications   Medication Sig Start Date End Date Taking? Authorizing Provider  gabapentin (NEURONTIN) 300 MG capsule Take 1 capsule (300 mg total) by mouth every 8 (eight) hours as needed. 10/22/21 11/21/21 Yes Dashley Monts, Carola Rhine, MD  lidocaine (LIDODERM) 5 % Place 1 patch onto the skin daily for 15 doses. Remove & Discard patch within 12 hours or as directed by MD 10/22/21 11/06/21 Yes Caralynn Gelber, Carola Rhine, MD  albuterol (VENTOLIN HFA) 108 (90 Base) MCG/ACT inhaler INHALE 2 PUFFS BY MOUTH EVERY 6 HOURS AS NEEDED FOR COUGHING, WHEEZING, OR SHORTNESS OF BREATH 09/09/21   Soyla Dryer, PA-C  ARIPiprazole (ABILIFY) 10 MG tablet TAKE 1 Tablet BY MOUTH ONCE EVERY NIGHT AT BEDTIME DOSE CHANGE 11/10/20   [provider]  ASPIRIN LOW DOSE 81 MG EC tablet Take 81 mg by mouth every morning. 09/05/20   [provider]  atorvastatin (LIPITOR) 80 MG tablet TAKE 1 Tablet BY MOUTH ONCE EVERY DAY 09/09/21   Soyla Dryer, PA-C  famotidine (PEPCID) 40 MG tablet Take 1 tablet (40 mg total) by mouth 2 (two) times daily. 05/26/21   Soyla Dryer, PA-C  Fluticasone-Salmeterol (ADVAIR DISKUS IN) Inhale 1 puff into the lungs daily. Rinse mouth after use    [provider]  gabapentin (NEURONTIN) 100 MG capsule TAKE 1 CAPSULE BY MOUTH THREE TIMES A DAY AS NEEDED 07/28/21   Soyla Dryer, PA-C  glipiZIDE (GLUCOTROL XL) 5 MG 24 hr tablet Take 1 tablet (5 mg total) by mouth daily with breakfast. 11/10/20   Nida, Marella Chimes, MD  insulin aspart protamine - aspart (NOVOLOG MIX 70/30 FLEXPEN) (70-30) 100 UNIT/ML FlexPen Inject 50-60 Units into the skin 2 (two) times daily with a meal. Only with breakfast and supper when premeal glucose is above 90 09/08/21   Nida, Marella Chimes, MD  metFORMIN (GLUCOPHAGE) 500 MG tablet TAKE 1 Tablet  BY MOUTH TWICE DAILY WITH A MEAL 07/20/21   Nida, Marella Chimes, MD  metoCLOPramide (REGLAN) 5 MG tablet Take 1 tablet (5 mg total) by mouth 4 (four) times daily -  before meals and at bedtime. 11/22/19   Laurine Blazer B, PA-C  metoprolol tartrate (LOPRESSOR) 25 MG tablet Take 0.5 tablets (12.5 mg total) by mouth 2 (two) times daily. 02/16/21   Imogene Burn, PA-C  nitroGLYCERIN (NITROSTAT) 0.4 MG SL tablet Place 1 tablet (0.4 mg total) under the tongue every 5 (five) minutes as needed for chest pain. Patient not taking: Reported on 07/03/2021 10/05/17   Soyla Dryer, PA-C  ondansetron (ZOFRAN-ODT) 4 MG disintegrating tablet Take 1 tablet (4 mg total) by mouth every 8 (eight)  hours as needed for nausea or vomiting. Patient not taking: Reported on 07/03/2021 04/22/21   Montine Circle, PA-C  polyethylene glycol (MIRALAX / GLYCOLAX) 17 g packet Take 17 g by mouth daily. Patient not taking: Reported on 07/27/2021 11/11/20   Orpah Greek, MD  promethazine (PHENERGAN) 25 MG suppository Place 1 suppository (25 mg total) rectally every 8 (eight) hours as needed for refractory nausea / vomiting. Patient not taking: Reported on 07/03/2021 12/20/18   Barton Dubois, MD  sertraline (ZOLOFT) 100 MG tablet Take 1 tablet by mouth daily. 01/09/20   [provider]  traZODone (DESYREL) 50 MG tablet Take 50 mg by mouth at  bedtime. 10/03/20   [provider]      Allergies    Omeprazole magnesium, Bee venom, Esomeprazole, and Morphine and related    Review of Systems   Review of Systems  Physical Exam Updated Vital Signs BP 126/87   Pulse 99   Temp 98 F (36.7 C) (Oral)   Resp 17   Ht 5\' 8"  (1.727 m)   Wt 68 kg   SpO2 98%   BMI 22.81 kg/m  Physical Exam Constitutional:      General: He is not in acute distress. HENT:     Head: Normocephalic and atraumatic.  Eyes:     Conjunctiva/sclera: Conjunctivae normal.     Pupils: Pupils are equal, round, and reactive to light.  Cardiovascular:     Rate and Rhythm: Normal rate and regular rhythm.     Comments: +3 second cap refill, weak pedal pulses bilaterally Pulmonary:     Effort: Pulmonary effort is normal. No respiratory distress.  Abdominal:     General: There is no distension.     Tenderness: There is no abdominal tenderness.  Skin:    General: Skin is warm and dry.  Neurological:     General: No focal deficit present.     Mental Status: He is alert. Mental status is at baseline.     Comments: Hyperalgesia of right lateral foot    ED Results / Procedures / Treatments   Labs (all labs ordered are listed, but only abnormal results are displayed) Labs Reviewed - No data to display  EKG None  Radiology No results found.  Procedures Procedures    Medications Ordered in ED Medications  gabapentin (NEURONTIN) capsule 300 mg (300 mg Oral Given 10/22/21 0908)    ED Course/ Medical Decision Making/ A&P                           Medical Decision Making Risk Prescription drug management.   Patient presenting with right foot pain.  He has hyperalgesia on exam, highly sensitive skin.  I suspect most likely related to a peripheral nerve issue, we can increase his gabapentin dose at home.  I also explained the possibility that he is having symptomatic peripheral vascular disease particularly with his coldness in his fingers and  toes at night and cramping pain at night.  I recommended warm heating packs instead, advised that his PCP arrange for an ABI and then appropriate referral to vascular specialist as needed.  Encouraged him to quit smoking.  He verbalized understanding.  We can try a lidocaine patch here, but improved the symptoms I can prescribe this as well.  We will increase his gabapentin to 300 mg 3 times daily as needed.        Final Clinical Impression(s) / ED Diagnoses Final diagnoses:  Right foot pain    Rx / DC Orders ED Discharge Orders          Ordered    gabapentin (NEURONTIN) 300 MG capsule  Every 8 hours PRN        10/22/21 0905    lidocaine (LIDODERM) 5 %  Every 24 hours       Note to Pharmacy: If medication too expensive, please direct to over the counter topical lidocaine alternative   10/22/21 0905              Wyvonnia Dusky, MD 10/22/21 1626

## 2021-10-22 NOTE — ED Triage Notes (Signed)
Pt c/o right lateral foot pain that started last night when he got into bed. Pt took his Gabapentin and used an ice pack with no relief.

## 2021-10-22 NOTE — Discharge Instructions (Addendum)
The pain in your right foot may be related to nerve pain, and may also be a problem with bad blood flow in your legs, which can happen in people who are heavy smokers.  I strongly encourage you to try to quit smoking if possible.  I would advise you reach out to your primary care doctor's office, who can arrange to have an ABI test (an Ankle Brachial Index), which looks at the blood flow in your legs and feet.  Try to use heating packs on your feet instead.

## 2021-10-27 ENCOUNTER — Ambulatory Visit: Payer: Self-pay | Admitting: Licensed Clinical Social Worker

## 2021-10-27 ENCOUNTER — Other Ambulatory Visit: Payer: Self-pay | Admitting: Physician Assistant

## 2021-10-27 DIAGNOSIS — F17200 Nicotine dependence, unspecified, uncomplicated: Secondary | ICD-10-CM

## 2021-10-27 DIAGNOSIS — E1165 Type 2 diabetes mellitus with hyperglycemia: Secondary | ICD-10-CM

## 2021-10-27 DIAGNOSIS — R0989 Other specified symptoms and signs involving the circulatory and respiratory systems: Secondary | ICD-10-CM

## 2021-10-27 DIAGNOSIS — I739 Peripheral vascular disease, unspecified: Secondary | ICD-10-CM

## 2021-10-27 DIAGNOSIS — F32A Depression, unspecified: Secondary | ICD-10-CM

## 2021-10-27 NOTE — Progress Notes (Signed)
[facility] engaged patient in follow-up visit. [facility] provided reflective listening and validation as patient shared about frustration, depression, and anxiety related to health struggles and interpersonal conflict. [facility] led patient in psychosomatic "hand over heart" exercise.

## 2021-10-28 ENCOUNTER — Other Ambulatory Visit: Payer: Self-pay | Admitting: "Endocrinology

## 2021-10-28 ENCOUNTER — Other Ambulatory Visit: Payer: Self-pay | Admitting: Physician Assistant

## 2021-11-04 ENCOUNTER — Ambulatory Visit (HOSPITAL_COMMUNITY)
Admission: RE | Admit: 2021-11-04 | Discharge: 2021-11-04 | Disposition: A | Discharge status: Home / Self Care | Payer: Medicaid Other | Source: Ambulatory Visit | Attending: Physician Assistant | Admitting: Physician Assistant

## 2021-11-04 DIAGNOSIS — I739 Peripheral vascular disease, unspecified: Secondary | ICD-10-CM

## 2021-11-04 DIAGNOSIS — R0989 Other specified symptoms and signs involving the circulatory and respiratory systems: Secondary | ICD-10-CM

## 2021-11-04 DIAGNOSIS — E1165 Type 2 diabetes mellitus with hyperglycemia: Secondary | ICD-10-CM

## 2021-11-04 DIAGNOSIS — F17200 Nicotine dependence, unspecified, uncomplicated: Secondary | ICD-10-CM

## 2021-11-09 ENCOUNTER — Encounter (INDEPENDENT_AMBULATORY_CARE_PROVIDER_SITE_OTHER): Payer: Self-pay | Admitting: Gastroenterology

## 2021-11-09 ENCOUNTER — Ambulatory Visit (INDEPENDENT_AMBULATORY_CARE_PROVIDER_SITE_OTHER): Payer: Self-pay | Admitting: Gastroenterology

## 2021-11-09 VITALS — BP 136/87 | HR 118 | Temp 98.9°F | Ht 68.0 in | Wt 148.6 lb

## 2021-11-09 DIAGNOSIS — K2270 Barrett's esophagus without dysplasia: Secondary | ICD-10-CM

## 2021-11-09 DIAGNOSIS — K219 Gastro-esophageal reflux disease without esophagitis: Secondary | ICD-10-CM

## 2021-11-09 MED ORDER — BACLOFEN 10 MG PO TABS: 10.0000 mg | ORAL_TABLET | Freq: Three times a day (TID) | ORAL | 1 refills | Status: DC

## 2021-11-09 NOTE — Progress Notes (Unsigned)
[name] [name], M.D. Gastroenterology & Hepatology [facility] [address] [place], [place] [address]  Primary Care Physician: Soyla Dryer, PA-C [address] [place] [address]  I will communicate my assessment and recommendations to the referring MD via EMR.  Problems: Refractory GERD PPI intolerance Short segment Barrett's esophagus Esophageal dysmotility  History of Present Illness: [name] [name] is a 51 y.o. male with a history of CAD, COPD, depression, diabetes, tobacco use, diabetic neuropathy, GERD complicated by short segment Barrett's esophagus, esophageal dysmotility, hyperlipidemia, HTN, migraines, OSA, chronic bronchitis, gout, anxiety, who presents for follow up of GERD.  The patient was last seen on 10/16/2020. At that time, the patient was advised to increase famotidine to 40 mg twice a day and he was referred for evaluation by Dr Bryan Lemma.  Patient was seen by Dr. Bryan Lemma on 06/25/2021.  He was advised to continue Pepcid and scheduled to undergo repeat EGD.  He was also ordered to have an esophageal manometry and pH impedance testing on famotidine.  Patient underwent EGD on 07/03/2021 which showed presence of C0-M2 Barrett's esophagus, grade a esophagitis, a 1 cm hiatal hernia was present with Hill Grade II.  Single gastric polyp, normal duodenum.  He underwent an esophageal manometry on 09/16/2021 at [facility] which showed IRP of 9.1.  40% of the swallows had normal latency but with presence of low distal intractable contacting and 60% were failed esophageal contractions with absent peristalsis.  This was considered to be an ineffective esophageal motility with poor contractile reserve.  pH impedance testing showed increased acid exposure of 14.4% with a DeMeester score of 41.3 consistent with ongoing acid reflux episodes.  Patient has frequent heartburn episodes and heartburn despite taking famotidine 40 mg  BID. He reports that food regurgitates frequently. Also reports that he has to drink some water after swallowing solid food as he feels "food gets stuck". He is currently on famotidine twice a day as he developed side effects from PPIs.  He takes Miralax 1 capful every day to move his bowels but sometimes he has to swallow an OTC laxative. He has to strain significantly to move his bowels.  Does not feel Reglan is helping him at all.  The patient denies having any nausea, vomiting, fever, chills, hematochezia, melena, hematemesis, abdominal distention, abdominal pain, diarrhea, jaundice, pruritus . Has lost a couple of lb recently.  Smokes 1/2 pack a day.  Last EGD:as above Last Colonoscopy: 11/2019 - Decreased sphincter tone found on digital rectal exam. - The entire examined colon is normal. - External hemorrhoids.  Past Medical History: Past Medical History:  Diagnosis Date   Anxiety    Arthritis    Asthma    CAD (coronary artery disease)    Moderate LAD disease 2016 - Dr. 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   Type 2 diabetes mellitus ([place])     Past Surgical History: Past Surgical History:  Procedure Laterality Date   ANKLE SURGERY Right 1982   "had extra bones in there; took them out"  APPENDECTOMY  1975   BIOPSY  12/26/2018   Procedure: BIOPSY;  Surgeon: Rogene Houston, MD;  [facility];  Service: Endoscopy;;  duodenal biopsies   BIOPSY  03/23/2019   Procedure: BIOPSY;  Surgeon: Rogene Houston, MD;  [facility];  Service: Endoscopy;;  esophagus   CARDIAC CATHETERIZATION N/A 03/28/2015   Procedure: Left  Heart Cath and Coronary Angiography;  Surgeon: Adrian Prows, MD;  Location: [place] [facility];  Service: Cardiovascular;  Laterality: N/A;   CARDIAC CATHETERIZATION N/A 03/28/2015   Procedure: Intravascular Pressure Wire/FFR Study;  Surgeon: Adrian Prows, MD;  Location: [place] [facility];  Service: Cardiovascular;  Laterality: N/A;   CARPAL TUNNEL RELEASE Left ~ 2008   COLONOSCOPY WITH ESOPHAGOGASTRODUODENOSCOPY (EGD)     COLONOSCOPY WITH PROPOFOL N/A 12/24/2019   Procedure: COLONOSCOPY WITH PROPOFOL;  Surgeon: Rogene Houston, MD;  [facility];  Service: Endoscopy;  Laterality: N/A;  955   ELBOW FRACTURE SURGERY Left ~ 2008   ESOPHAGEAL MANOMETRY N/A 09/16/2021   Procedure: ESOPHAGEAL MANOMETRY (EM);  Surgeon: Lavena Bullion, DO;  Location: [facility];  Service: Endoscopy;  Laterality: N/A;   ESOPHAGOGASTRODUODENOSCOPY N/A 12/26/2018   Procedure: ESOPHAGOGASTRODUODENOSCOPY (EGD);  Surgeon: Rogene Houston, MD;  [facility];  Service: Endoscopy;  Laterality: N/A;   ESOPHAGOGASTRODUODENOSCOPY (EGD) WITH PROPOFOL N/A 03/23/2019   Procedure: ESOPHAGOGASTRODUODENOSCOPY (EGD) WITH PROPOFOL;  Surgeon: Rogene Houston, MD;  [facility];  Service: Endoscopy;  Laterality: N/A;  7:30   FRACTURE SURGERY     ankle and elbow   [place] IMPEDANCE STUDY N/A 09/16/2021   Procedure: [place] IMPEDANCE STUDY;  Surgeon: Lavena Bullion, DO;  Location: [facility];  Service: Endoscopy;  Laterality: N/A;   PILONIDAL CYST EXCISION N/A 03/24/2017   Procedure: EXCISION CHRONIC  PILONIDAL ABSCESS;  Surgeon: Coralie Keens, MD;  Location: [facility];  Service: General;  Laterality: N/A;   SCROTAL EXPLORATION N/A 11/13/2020   Procedure: EXCISION OF SEBACEOUS CYSTS, SCROTUM;  Surgeon: Cleon Gustin, MD;  Location: [facility];  Service: Urology;  Laterality: N/A;   TENDON REPAIR Left ~ 2004   "main tendon in my ankle"    Family History: Family History  Problem Relation Age of Onset    Diabetes Mother    Hypertension Mother    Cancer Mother    Throat cancer Mother    Diabetes Father    Hypertension Father    Hyperlipidemia Father    Congestive Heart Failure Sister    Colon cancer Neg Hx    Pancreatic cancer Neg Hx    Liver disease Neg Hx     Social History: Social History   Tobacco Use  Smoking Status Every Day   Packs/day: 0.50   Years: 34.00   Total pack years: 17.00   Types: Cigarettes  Smokeless Tobacco Never  Tobacco Comments   1/2 pack a day   Social History   Substance and Sexual Activity  Alcohol Use Never   Alcohol/week: 0.0 standard drinks of alcohol   Social History   Substance and Sexual Activity  Drug Use Never    Allergies: Allergies  Allergen Reactions   Omeprazole Magnesium Swelling    Face swells, no breathing impairment   Bee Venom    Esomeprazole Swelling    Face swells, no breathing impairment   Morphine And Related     Itching at IV site, dry heaving, burning sensation in throat.    Medications: Current Outpatient  Medications  Medication Sig Dispense Refill   albuterol (VENTOLIN HFA) 108 (90 Base) MCG/ACT inhaler INHALE 2 PUFFS BY MOUTH EVERY 6 HOURS AS NEEDED FOR COUGHING, WHEEZING, OR SHORTNESS OF BREATH 20.1 g 0   ARIPiprazole (ABILIFY) 10 MG tablet TAKE 1 Tablet BY MOUTH ONCE EVERY NIGHT AT BEDTIME DOSE CHANGE     ASPIRIN LOW DOSE 81 MG EC tablet Take 81 mg by mouth every morning.     atorvastatin (LIPITOR) 80 MG tablet TAKE 1 Tablet BY MOUTH ONCE EVERY DAY 90 tablet 0   famotidine (PEPCID) 40 MG tablet Take 1 tablet (40 mg total) by mouth 2 (two) times daily. 60 tablet 3   Fluticasone-Salmeterol (ADVAIR DISKUS IN) Inhale 1 puff into the lungs daily. Rinse mouth after use     gabapentin (NEURONTIN) 300 MG capsule Take 1 capsule (300 mg total) by mouth every 8 (eight) hours as needed. 30 capsule 0   glipiZIDE (GLUCOTROL XL) 5 MG 24 hr tablet Take 1 tablet (5 mg total) by mouth daily with breakfast. 30 tablet 3    insulin aspart protamine - aspart (NOVOLOG MIX 70/30 FLEXPEN) (70-30) 100 UNIT/ML FlexPen Inject 50-60 Units into the skin 2 (two) times daily with a meal. Only with breakfast and supper when premeal glucose is above 90 30 mL 2   metFORMIN (GLUCOPHAGE) 500 MG tablet TAKE 1 Tablet  BY MOUTH TWICE DAILY WITH A MEAL 180 tablet 0   metoCLOPramide (REGLAN) 5 MG tablet Take 1 tablet (5 mg total) by mouth 4 (four) times daily -  before meals and at bedtime. 120 tablet 1   nitroGLYCERIN (NITROSTAT) 0.4 MG SL tablet Place 1 tablet (0.4 mg total) under the tongue every 5 (five) minutes as needed for chest pain. 25 tablet 3   ondansetron (ZOFRAN-ODT) 4 MG disintegrating tablet Take 1 tablet (4 mg total) by mouth every 8 (eight) hours as needed for nausea or vomiting. 10 tablet 0   polyethylene glycol (MIRALAX / GLYCOLAX) 17 g packet Take 17 g by mouth daily. 14 each 0   promethazine (PHENERGAN) 25 MG suppository Place 1 suppository (25 mg total) rectally every 8 (eight) hours as needed for refractory nausea / vomiting. 12 each 0   sertraline (ZOLOFT) 100 MG tablet Take 1 tablet by mouth daily.     traZODone (DESYREL) 50 MG tablet Take 50 mg by mouth at bedtime.     metoprolol tartrate (LOPRESSOR) 25 MG tablet Take 0.5 tablets (12.5 mg total) by mouth 2 (two) times daily. 90 tablet 3   No current facility-administered medications for this visit.    Review of Systems: GENERAL: negative for malaise, night sweats HEENT: No changes in hearing or vision, no nose bleeds or other nasal problems. NECK: Negative for lumps, goiter, pain and significant neck swelling RESPIRATORY: Negative for cough, wheezing CARDIOVASCULAR: Negative for chest pain, leg swelling, palpitations, orthopnea GI: SEE HPI MUSCULOSKELETAL: Negative for joint pain or swelling, back pain, and muscle pain. SKIN: Negative for lesions, rash PSYCH: Negative for sleep disturbance, mood disorder and recent psychosocial stressors. HEMATOLOGY  Negative for prolonged bleeding, bruising easily, and swollen nodes. ENDOCRINE: Negative for cold or heat intolerance, polyuria, polydipsia and goiter. NEURO: negative for tremor, gait imbalance, syncope and seizures. The remainder of the review of systems is noncontributory.   Physical Exam: BP 136/87 (BP Location: Left Arm, Patient Position: Sitting, Cuff Size: Small)   Pulse (!) 118   Temp 98.9 F (37.2 C) (Oral)   Ht 5\' 8"  (1.727 m)  Wt 148 lb 9.6 oz (67.4 kg)   BMI 22.59 kg/m  GENERAL: The patient is AO x3, in no acute distress. HEENT: Head is normocephalic and atraumatic. EOMI are intact. Mouth is well hydrated and without lesions. NECK: Supple. No masses LUNGS: Clear to auscultation. No presence of rhonchi/wheezing/rales. Adequate chest expansion HEART: RRR, normal s1 and s2. ABDOMEN: Soft, nontender, no guarding, no peritoneal signs, and nondistended. BS +. No masses. EXTREMITIES: Without any cyanosis, clubbing, rash, lesions or edema. NEUROLOGIC: AOx3, no focal motor deficit. SKIN: no jaundice, no rashes  Imaging/Labs: as above  I personally reviewed and interpreted the available labs, imaging and endoscopic files.  Impression and Plan: [name] [name] is a 51 y.o. male with a history of CAD, COPD, depression, diabetes, tobacco use, diabetic neuropathy, GERD complicated by short segment Barrett's esophagus, esophageal dysmotility, hyperlipidemia, HTN, migraines, OSA, chronic bronchitis, gout, anxiety, who presents for follow up of GERD.  The patient has presented with persistent reflux symptoms despite taking famotidine twice a day compliantly.  Unfortunately, he has presented significant side effects with the use of PPIs and is not a candidate to be on this therapy.  He also underwent an extensive evaluation for possible TIF versus Nissen fundoplication, but he was not considered a candidate for either of these procedures as he had decrease peristaltic reserve in his  esophagus, which is a contraindication given the high risk of presenting worsening dysphagia postprocedure.  I discussed thoroughly with the patient the fact that there were very limited options for his current GERD and Barrett's esophagus.  I advised him to continue on famotidine twice a day and will use baclofen for now, as he has shown to improve symptoms in some patients with GERD in the past.  We will also refer him to the [facility] at [facility] for a second opinion regarding other medical options vs other surgical options (?bypass) for his GERD given his limited options.  Unfortunately, potassium competitive acid blockers are not available in the [place] market as of now.  He will need a repeat EGD in January 2025.  I also advised the patient to stop smoking as this may help with his GERD symptoms.  Finally, I will stop his Reglan as this has not provided any improvement of his symptoms and can lead to significant side effects.  - Continue famotidine 40 mg twice a day for now - Start baclofen 10 mg TID - Stop Reglan - Will refer to [facility] for second opinion regarding refractory GERD intolerant to PPI and with low distal esophageal contraction reserve - Patient was counseled regarding the importance of stopping cigarette smoking. The patient was informed about the long term effects of smoking, progression of current disease.  All questions were answered.      Harvel Quale, MD Gastroenterology and Hepatology [facility]

## 2021-11-09 NOTE — Patient Instructions (Addendum)
Continue famotidine 40 mg twice a day for now Start baclofen 10 mg TID Stop Reglan Will refer to [facility] for second opinion regarding refractory GERD intolerant to PPI and with low distal esophageal contraction reserve Patient was counseled regarding the importance of stopping cigarette smoking. The patient was informed about the long term effects of smoking, progression of current disease.

## 2021-11-11 ENCOUNTER — Ambulatory Visit: Payer: Self-pay | Admitting: Licensed Clinical Social Worker

## 2021-11-12 ENCOUNTER — Telehealth: Payer: Self-pay | Admitting: "Endocrinology

## 2021-11-12 NOTE — Telephone Encounter (Signed)
Received medical record request from [facility], requesting last 3 office note and medication list. Faxed at [phone] per patient and [facility] request.

## 2021-11-13 ENCOUNTER — Telehealth: Payer: Self-pay

## 2021-11-13 NOTE — Telephone Encounter (Signed)
Called client to follow up regarding Baclofen recently prescribed. No answer, left voicemail requesting return call today.  Previously discussed client alternatives with Baclofen 10 mg that was sent by his Gastro[facility]ology provider [facility] [place], client reports the cost would be over 100$. Checked GoodRx at [place] and found it to be a little over 34$ if he had it transferred to area [facility] and request GoodRx price for one month. Also discussed that I would check for alternatives. Also checked with [facility], they do carry medication and could transfer and fill for around 10$ and client would get that in the mail, However, need to discuss option with client before proceeding. Also noted that client states they is a medication they are going to "try me on" . Unsure if this would be a long term medication. Medication not available through [facility].    [name] RN Clara [facility]

## 2021-11-18 ENCOUNTER — Telehealth: Payer: Self-pay

## 2021-11-18 NOTE — Telephone Encounter (Signed)
Called client to follow up regarding the Baclofen prescribed. Had discussed at a previous visit [facility], cost is over 100$. If client tranfered prescription to [facility] cost is listed on GOODRX as a little over 34$.  Client states he has not called to have the presription transferred. Discussed another option available at [facility] pharmacy of 10$ per month however client would need to make arrangements how to pay for that. They would send it in the mail. That is he cheapest alternative I have found. He says he will talk to his Dad and let me know.  [name] RN [employer]

## 2021-11-28 ENCOUNTER — Encounter (HOSPITAL_COMMUNITY): Payer: Self-pay | Admitting: Emergency Medicine

## 2021-11-28 ENCOUNTER — Emergency Department (HOSPITAL_COMMUNITY)
Admission: EM | Admit: 2021-11-28 | Discharge: 2021-11-29 | Disposition: A | Discharge status: Home / Self Care | Payer: Self-pay | Attending: Emergency Medicine | Admitting: Emergency Medicine

## 2021-11-28 DIAGNOSIS — E119 Type 2 diabetes mellitus without complications: Secondary | ICD-10-CM | POA: Insufficient documentation

## 2021-11-28 DIAGNOSIS — E875 Hyperkalemia: Secondary | ICD-10-CM | POA: Insufficient documentation

## 2021-11-28 DIAGNOSIS — F17210 Nicotine dependence, cigarettes, uncomplicated: Secondary | ICD-10-CM | POA: Insufficient documentation

## 2021-11-28 DIAGNOSIS — I10 Essential (primary) hypertension: Secondary | ICD-10-CM | POA: Insufficient documentation

## 2021-11-28 DIAGNOSIS — J449 Chronic obstructive pulmonary disease, unspecified: Secondary | ICD-10-CM | POA: Insufficient documentation

## 2021-11-28 DIAGNOSIS — R197 Diarrhea, unspecified: Secondary | ICD-10-CM | POA: Insufficient documentation

## 2021-11-28 DIAGNOSIS — R112 Nausea with vomiting, unspecified: Secondary | ICD-10-CM | POA: Insufficient documentation

## 2021-11-28 DIAGNOSIS — E860 Dehydration: Secondary | ICD-10-CM | POA: Insufficient documentation

## 2021-11-28 DIAGNOSIS — J45909 Unspecified asthma, uncomplicated: Secondary | ICD-10-CM | POA: Insufficient documentation

## 2021-11-28 DIAGNOSIS — I2510 Atherosclerotic heart disease of native coronary artery without angina pectoris: Secondary | ICD-10-CM | POA: Insufficient documentation

## 2021-11-28 MED ORDER — ONDANSETRON HCL 4 MG/2ML IJ SOLN
4.0000 mg | Freq: Once | INTRAMUSCULAR | Status: AC
  Administered 2021-11-29: 4 mg via INTRAVENOUS
  Filled 2021-11-28: qty 2

## 2021-11-28 MED ORDER — LACTATED RINGERS IV BOLUS
1000.0000 mL | Freq: Once | INTRAVENOUS | Status: AC
  Administered 2021-11-29: 1000 mL via INTRAVENOUS

## 2021-11-28 MED ORDER — HYDROMORPHONE HCL 1 MG/ML IJ SOLN
1.0000 mg | Freq: Once | INTRAMUSCULAR | Status: AC
  Administered 2021-11-29: 1 mg via INTRAVENOUS
  Filled 2021-11-28: qty 1

## 2021-11-28 MED ORDER — LACTATED RINGERS IV BOLUS
1000.0000 mL | Freq: Once | INTRAVENOUS | Status: AC
Start: 2021-11-29 — End: 2021-11-29
  Administered 2021-11-29: 1000 mL via INTRAVENOUS

## 2021-11-28 MED ORDER — FAMOTIDINE IN NACL 20-0.9 MG/50ML-% IV SOLN
20.0000 mg | Freq: Once | INTRAVENOUS | Status: AC
  Administered 2021-11-29: 20 mg via INTRAVENOUS
  Filled 2021-11-28: qty 50

## 2021-11-28 NOTE — ED Triage Notes (Signed)
Pt arrives c/o N/V/D since yesterday. Pt states he is feeling weak and also having a sore throat from vomiting.

## 2021-11-29 ENCOUNTER — Emergency Department (HOSPITAL_COMMUNITY): Payer: Self-pay

## 2021-11-29 LAB — CBC
HCT: 50.6 % (ref 39.0–52.0)
Hemoglobin: 17.6 g/dL — ABNORMAL HIGH (ref 13.0–17.0)
MCH: 30.7 pg (ref 26.0–34.0)
MCHC: 34.8 g/dL (ref 30.0–36.0)
MCV: 88.2 fL (ref 80.0–100.0)
Platelets: 311 10*3/uL (ref 150–400)
RBC: 5.74 MIL/uL (ref 4.22–5.81)
RDW: 12.6 % (ref 11.5–15.5)
WBC: 16.5 10*3/uL — ABNORMAL HIGH (ref 4.0–10.5)
nRBC: 0 % (ref 0.0–0.2)

## 2021-11-29 LAB — COMPREHENSIVE METABOLIC PANEL
ALT: 13 U/L (ref 0–44)
AST: 13 U/L — ABNORMAL LOW (ref 15–41)
Albumin: 4 g/dL (ref 3.5–5.0)
Alkaline Phosphatase: 105 U/L (ref 38–126)
Anion gap: 16 — ABNORMAL HIGH (ref 5–15)
BUN: 38 mg/dL — ABNORMAL HIGH (ref 6–20)
CO2: 21 mmol/L — ABNORMAL LOW (ref 22–32)
Calcium: 9.4 mg/dL (ref 8.9–10.3)
Chloride: 97 mmol/L — ABNORMAL LOW (ref 98–111)
Creatinine, Ser: 1.9 mg/dL — ABNORMAL HIGH (ref 0.61–1.24)
GFR, Estimated: 42 mL/min — ABNORMAL LOW (ref >60)
Glucose, Bld: 459 mg/dL — ABNORMAL HIGH (ref 70–99)
Potassium: 5.8 mmol/L — ABNORMAL HIGH (ref 3.5–5.1)
Sodium: 134 mmol/L — ABNORMAL LOW (ref 135–145)
Total Bilirubin: 1.4 mg/dL — ABNORMAL HIGH (ref 0.3–1.2)
Total Protein: 8.1 g/dL (ref 6.5–8.1)

## 2021-11-29 LAB — BASIC METABOLIC PANEL
Anion gap: 5 (ref 5–15)
BUN: 29 mg/dL — ABNORMAL HIGH (ref 6–20)
CO2: 23 mmol/L (ref 22–32)
Calcium: 6.9 mg/dL — ABNORMAL LOW (ref 8.9–10.3)
Chloride: 111 mmol/L (ref 98–111)
Creatinine, Ser: 1.24 mg/dL (ref 0.61–1.24)
GFR, Estimated: >60 mL/min (ref >60)
Glucose, Bld: 285 mg/dL — ABNORMAL HIGH (ref 70–99)
Potassium: 4.2 mmol/L (ref 3.5–5.1)
Sodium: 139 mmol/L (ref 135–145)

## 2021-11-29 LAB — PROTIME-INR
INR: 1 (ref 0.8–1.2)
Prothrombin Time: 12.6 seconds (ref 11.4–15.2)

## 2021-11-29 LAB — TROPONIN I (HIGH SENSITIVITY)
Troponin I (High Sensitivity): 10 ng/L (ref <18)
Troponin I (High Sensitivity): 11 ng/L (ref <18)

## 2021-11-29 LAB — LIPASE, BLOOD: Lipase: 24 U/L (ref 11–51)

## 2021-11-29 MED ORDER — HYDROMORPHONE HCL 1 MG/ML IJ SOLN
1.0000 mg | Freq: Once | INTRAMUSCULAR | Status: AC
  Administered 2021-11-29: 1 mg via INTRAVENOUS
  Filled 2021-11-29: qty 1

## 2021-11-29 MED ORDER — SUCRALFATE 1 G PO TABS: 1.0000 g | ORAL_TABLET | Freq: Four times a day (QID) | ORAL | 0 refills | Status: DC | PRN

## 2021-11-29 MED ORDER — CALCIUM GLUCONATE-NACL 1-0.675 GM/50ML-% IV SOLN
1.0000 g | Freq: Once | INTRAVENOUS | Status: AC
  Administered 2021-11-29: 1000 mg via INTRAVENOUS
  Filled 2021-11-29: qty 50

## 2021-11-29 MED ORDER — LOPERAMIDE HCL 2 MG PO CAPS: 2.0000 mg | ORAL_CAPSULE | Freq: Four times a day (QID) | ORAL | 0 refills | Status: DC | PRN

## 2021-11-29 MED ORDER — SODIUM BICARBONATE 8.4 % IV SOLN
50.0000 meq | Freq: Once | INTRAVENOUS | Status: AC
  Administered 2021-11-29: 50 meq via INTRAVENOUS

## 2021-11-29 MED ORDER — ONDANSETRON HCL 4 MG/2ML IJ SOLN
4.0000 mg | Freq: Once | INTRAMUSCULAR | Status: AC
  Administered 2021-11-29: 4 mg via INTRAVENOUS
  Filled 2021-11-29: qty 2

## 2021-11-29 MED ORDER — ONDANSETRON 4 MG PO TBDP: 4.0000 mg | ORAL_TABLET | Freq: Three times a day (TID) | ORAL | 0 refills | Status: DC | PRN

## 2021-11-29 NOTE — ED Provider Notes (Signed)
[facility]-EMERGENCY DEPT [facility] Emergency Department Provider Note MRN:  [id number]  Arrival date & time: 11/29/21     Chief Complaint   Emesis   History of Present Illness   [name] [name] is a 51 y.o. year-old male with a history of CAD, GERD, diabetes presenting to the ED with chief complaint of emesis.  Patient is complaining of lower chest and epigastric abdominal pain, associated with nausea, vomiting, diarrhea for the past 2 days.  Feeling generally unwell.  Denies fever, no shortness of breath, no lower abdominal pain.  Denies use of alcohol.  Says this has happened before, happens from time to time.  Review of Systems  A thorough review of systems was obtained and all systems are negative except as noted in the HPI and PMH.   Patient's Health History    Past Medical History:  Diagnosis Date   Anxiety    Arthritis    Asthma    CAD (coronary artery disease)    Moderate LAD disease 2016 - Dr. 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   Type 2 diabetes mellitus (HCC)     Past Surgical History:  Procedure Laterality Date   ANKLE SURGERY Right 1982   "had extra bones in there; took them out"   APPENDECTOMY  1975   BIOPSY  12/26/2018   Procedure: BIOPSY;  Surgeon: Malissa Hippo, MD;  Location: [facility] ENDO SU[facility];  Service: Endoscopy;;  duodenal biopsies   BIOPSY  03/23/2019   Procedure: BIOPSY;  Surgeon: Malissa Hippo, MD;  Location: [facility];  Service: Endoscopy;;  esophagus   CARDIAC CATHETERIZATION N/A 03/28/2015   Procedure: Left Heart Cath and Coronary Angiography;  Surgeon: Yates Decamp, MD;  Location: [facility] INVASIVE CV LAB;   Service: Cardiovascular;  Laterality: N/A;   CARDIAC CATHETERIZATION N/A 03/28/2015   Procedure: Intravascular Pressure Wire/FFR Study;  Surgeon: Yates Decamp, MD;  Location: [facility] INVASIVE CV LAB;  Service: Cardiovascular;  Laterality: N/A;   CARPAL TUNNEL RELEASE Left ~ 2008   COLONOSCOPY WITH ESOPHAGOGASTRODUODENOSCOPY (EGD)     COLONOSCOPY WITH PROPOFOL N/A 12/24/2019   Procedure: COLONOSCOPY WITH PROPOFOL;  Surgeon: Malissa Hippo, MD;  Location: [facility] END[facility] SUITE;  Service: Endoscopy;  Laterality: N/A;  955   ELBOW FRACTURE SURGERY Left ~ 2008   ESOPHAGEAL MANOMETRY N/A 09/16/2021   Procedure: ESOPHAGEAL MANOMETRY (EM);  Surgeon: Shellia Cleverly, DO;  Location: [facility] ENDOSCOPY;  Service: Endoscopy;  Laterality: N/A;   ESOPHAGOGASTRODUODENOSCOPY N/A 12/26/2018   Procedure: ESOPHAGOGASTRODUODENOSCOPY (EGD);  Surgeon: Malissa Hippo, MD;  Location[facility];  Service: Endoscopy;  Laterality: N/A;   ESOPHAGOGASTRODUODENOSCOPY (EGD) WITH PROPOFOL N/A 03/23/2019   Procedure: ESOPHAGOGASTRODUODENOSCOPY (EGD) WITH PROPOFOL;  Surgeon: Malissa Hippo, MD;  Location[facility];  Service: Endoscopy;  Laterality: N/A;  7:30   FRACTURE SURGERY     ankle and elbow   PH IMPEDANCE STUDY N/A 09/16/2021   Procedure: PH IMPEDANCE STUDY;  Surgeon: Shellia Cleverly, DO;  Location: [facility];  Service: Endoscopy;  Laterality: N/A;   PILONIDAL  CYST EXCISION N/A 03/24/2017   Procedure: EXCISION CHRONIC  PILONIDAL ABSCESS;  Surgeon: [name] Miyamoto, MD;  Location: [facility] ORS;  Service: General;  Laterality: N/A;   SCROTAL EXPLORATION N/A 11/13/2020   Procedure: EXCISION OF SEBACEOUS CYSTS, SCROTUM;  Surgeon: Malen Gauze, MD;  Location: [facility] ORS;  Service: Urology;  Laterality: N/A;   TENDON REPAIR Left ~ 2004   "main tendon in my ankle"    Family History  Problem Relation Age of Onset   Diabetes Mother    Hypertension Mother    Cancer Mother    Throat cancer Mother    Diabetes Father     Hypertension Father    Hyperlipidemia Father    Congestive Heart Failure Sister    Colon cancer Neg Hx    Pancreatic cancer Neg Hx    Liver disease Neg Hx     Social History   Socioeconomic History   Marital status: Single    Spouse name: Not on file   Number of children: 0   Years of education: 10th g   Highest education level: Not on file  Occupational History   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   Social Determinants of Health   Financial Resource Strain: Not on file  Food Insecurity: Not on file  Transportation Needs: Not on file  Physical Activity: Not on file  Stress: Not on file  Social Connections: Not on file  Intimate Partner Violence: Not on file     Physical Exam   Vitals:   11/29/21 0330 11/29/21 0430  BP: 137/86 121/82  Pulse: (!) 107 100  Resp: 19 19  Temp:    SpO2: 99% 100%    CONSTITUTIONAL: Chronically ill-appearing, NAD NEURO/PSYCH:  Alert and oriented x 3, no focal deficits EYES:  eyes equal and reactive ENT/NECK:  no LAD, no JVD CARDIO: Regular rate, well-perfused, normal S1 and S2 PULM:  CTAB no wheezing or rhonchi GI/GU:  non-distended, non-tender MSK/SPINE:  No gross deformities, no edema SKIN:  no rash, atraumatic   *Additional and/or pertinent findings included in MDM below  Diagnostic and Interventional Summary    EKG Interpretation  Date/Time:  Sunday November 29 2021 01:34:36 EDT Ventricular Rate:  120 PR  Interval:  111 QRS Duration: 145 QT Interval:  376 QTC Calculation: 532 R Axis:   -88 Text Interpretation: Sinus tachycardia Atrial premature complex RBBB and LAFB Baseline wander in lead(s) V4 Confirmed by Kennis Carina [phone]) on 11/29/2021 1:38:00 AM       Labs Reviewed  CBC - Abnormal; Notable for the following components:      Result Value   WBC 16.5 (*)    Hemoglobin 17.6 (*)    All other components within normal limits  COMPREHENSIVE METABOLIC PANEL - Abnormal; Notable for the following components:   Sodium 134 (*)    Potassium 5.8 (*)  Chloride 97 (*)    CO2 21 (*)    Glucose, Bld 459 (*)    BUN 38 (*)    Creatinine, Ser 1.90 (*)    AST 13 (*)    Total Bilirubin 1.4 (*)    GFR, Estimated 42 (*)    Anion gap 16 (*)    All other components within normal limits  BASIC METABOLIC PANEL - Abnormal; Notable for the following components:   Glucose, Bld 285 (*)    BUN 29 (*)    Calcium 6.9 (*)    All other components within normal limits  LIPASE, BLOOD  PROTIME-INR  TROPONIN I (HIGH SENSITIVITY)  TROPONIN I (HIGH SENSITIVITY)    CT ABDOMEN PELVIS WO CONTRAST  Final Result    DG Chest Port 1 View  Final Result      Medications  lactated ringers bolus 1,000 mL (0 mLs Intravenous Stopped 11/29/21 0326)  lactated ringers bolus 1,000 mL (0 mLs Intravenous Stopped 11/29/21 0327)  ondansetron (ZOFRAN) injection 4 mg (4 mg Intravenous Given 11/29/21 0004)  HYDROmorphone (DILAUDID) injection 1 mg (1 mg Intravenous Given 11/29/21 0005)  famotidine (PEPCID) IVPB 20 mg premix (0 mg Intravenous Stopped 11/29/21 0326)  calcium gluconate 1 g/ 50 mL sodium chloride IVPB (0 mg Intravenous Stopped 11/29/21 0327)  sodium bicarbonate injection 50 mEq (50 mEq Intravenous Given 11/29/21 0301)  HYDROmorphone (DILAUDID) injection 1 mg (1 mg Intravenous Given 11/29/21 0145)  ondansetron (ZOFRAN) injection 4 mg (4 mg Intravenous Given 11/29/21 0145)     Procedures  /  Critical Care Procedures  ED  Course and Medical Decision Making  Initial Impression and Ddx Differential diagnosis includes pancreatitis, gastritis, ACS, gastroenteritis, less likely cholecystitis.  Complications of dehydration such as AKI, electrolyte disturbance also considered.  Awaiting labs, providing fluids, symptom control and will reassess.  Past medical/surgical history that increases complexity of ED encounter: None  Interpretation of Diagnostics I personally reviewed the EKG and my interpretation is as follows: Sinus tachycardia, potentially peaked T waves compared to prior  Labs reveal hemoconcentration suggestive of dehydration, with mild AKI, hyperkalemia.  Patient Reassessment and Ultimate Disposition/Management     Patient with continued pain and tachycardia and so CT obtained to exclude significant intra-abdominal process such as perforated viscus.  CT is reassuring, after fluids and medications listed above, patient is feeling much better, vital signs have normalized, repeat BMP showing improvement in electrolytes, kidney function.  He is now feeling well and would like to go home, strict return precautions.  Patient management required discussion with the following services or consulting groups:  None  Complexity of Problems Addressed Acute illness or injury that poses threat of life of bodily function  Additional Data Reviewed and Analyzed Further history obtained from: None  Additional Factors Impacting ED Encounter Risk Use of parenteral controlled substances and Consideration of hospitalization  Elmer Sow. Pilar Plate, MD [facility] Emergency Medicine [facility] [email or web address]  Final Clinical Impressions(s) / ED Diagnoses     ICD-10-CM   1. Nausea vomiting and diarrhea  R11.2    R19.7     2. Dehydration  E86.0       ED Discharge Orders          Ordered    loperamide (IMODIUM) 2 MG capsule  4 times daily PRN        11/29/21 0501    ondansetron (ZOFRAN-ODT) 4 MG  disintegrating tablet  Every 8 hours PRN        11/29/21  0501    sucralfate (CARAFATE) 1 g tablet  4 times daily PRN        11/29/21 0501             Discharge Instructions Discussed with and Provided to Patient:     Discharge Instructions      You were evaluated in the Emergency Department and after careful evaluation, we did not find any emergent condition requiring admission or further testing in the hospital.  Your exam/testing today is overall reassuring.  Symptoms seem to be due to a viral gastroenteritis with some dehydration.  Use the Zofran for nausea, use the Carafate for abdominal pain, use the Imodium for diarrhea.  Plenty of fluids and rest over the next few days.  Please return to the Emergency Department if you experience any worsening of your condition.   Thank you for allowing [place] to be a part of your care.       Sabas Sous, MD 11/29/21 [phone]

## 2021-11-29 NOTE — Discharge Instructions (Signed)
You were evaluated in the Emergency Department and after careful evaluation, we did not find any emergent condition requiring admission or further testing in the hospital.  Your exam/testing today is overall reassuring.  Symptoms seem to be due to a viral gastroenteritis with some dehydration.  Use the Zofran for nausea, use the Carafate for abdominal pain, use the Imodium for diarrhea.  Plenty of fluids and rest over the next few days.  Please return to the Emergency Department if you experience any worsening of your condition.   Thank you for allowing [place] to be a part of your care.

## 2021-12-01 ENCOUNTER — Emergency Department (HOSPITAL_COMMUNITY): Payer: Self-pay

## 2021-12-01 ENCOUNTER — Encounter (HOSPITAL_COMMUNITY): Payer: Self-pay

## 2021-12-01 ENCOUNTER — Other Ambulatory Visit: Payer: Self-pay

## 2021-12-01 ENCOUNTER — Emergency Department (HOSPITAL_COMMUNITY)
Admission: EM | Admit: 2021-12-01 | Discharge: 2021-12-01 | Disposition: A | Discharge status: Home / Self Care | Payer: Self-pay | Attending: Emergency Medicine | Admitting: Emergency Medicine

## 2021-12-01 DIAGNOSIS — Z794 Long term (current) use of insulin: Secondary | ICD-10-CM | POA: Insufficient documentation

## 2021-12-01 DIAGNOSIS — Z7984 Long term (current) use of oral hypoglycemic drugs: Secondary | ICD-10-CM | POA: Insufficient documentation

## 2021-12-01 DIAGNOSIS — R000 Tachycardia, unspecified: Secondary | ICD-10-CM | POA: Insufficient documentation

## 2021-12-01 DIAGNOSIS — F419 Anxiety disorder, unspecified: Secondary | ICD-10-CM | POA: Insufficient documentation

## 2021-12-01 DIAGNOSIS — I2510 Atherosclerotic heart disease of native coronary artery without angina pectoris: Secondary | ICD-10-CM | POA: Insufficient documentation

## 2021-12-01 DIAGNOSIS — E119 Type 2 diabetes mellitus without complications: Secondary | ICD-10-CM | POA: Insufficient documentation

## 2021-12-01 DIAGNOSIS — R42 Dizziness and giddiness: Secondary | ICD-10-CM

## 2021-12-01 DIAGNOSIS — R0602 Shortness of breath: Secondary | ICD-10-CM | POA: Insufficient documentation

## 2021-12-01 DIAGNOSIS — Z79899 Other long term (current) drug therapy: Secondary | ICD-10-CM | POA: Insufficient documentation

## 2021-12-01 DIAGNOSIS — J449 Chronic obstructive pulmonary disease, unspecified: Secondary | ICD-10-CM | POA: Insufficient documentation

## 2021-12-01 DIAGNOSIS — I10 Essential (primary) hypertension: Secondary | ICD-10-CM | POA: Insufficient documentation

## 2021-12-01 LAB — CBC WITH DIFFERENTIAL/PLATELET
Abs Immature Granulocytes: 0.03 10*3/uL (ref 0.00–0.07)
Basophils Absolute: 0.1 10*3/uL (ref 0.0–0.1)
Basophils Relative: 1 %
Eosinophils Absolute: 0.1 10*3/uL (ref 0.0–0.5)
Eosinophils Relative: 1 %
HCT: 44.7 % (ref 39.0–52.0)
Hemoglobin: 15.3 g/dL (ref 13.0–17.0)
Immature Granulocytes: 0 %
Lymphocytes Relative: 25 %
Lymphs Abs: 2.7 10*3/uL (ref 0.7–4.0)
MCH: 31 pg (ref 26.0–34.0)
MCHC: 34.2 g/dL (ref 30.0–36.0)
MCV: 90.5 fL (ref 80.0–100.0)
Monocytes Absolute: 0.6 10*3/uL (ref 0.1–1.0)
Monocytes Relative: 6 %
Neutro Abs: 7.4 10*3/uL (ref 1.7–7.7)
Neutrophils Relative %: 67 %
Platelets: 244 10*3/uL (ref 150–400)
RBC: 4.94 MIL/uL (ref 4.22–5.81)
RDW: 12.6 % (ref 11.5–15.5)
WBC: 10.8 10*3/uL — ABNORMAL HIGH (ref 4.0–10.5)
nRBC: 0 % (ref 0.0–0.2)

## 2021-12-01 LAB — COMPREHENSIVE METABOLIC PANEL
ALT: 11 U/L (ref 0–44)
AST: 12 U/L — ABNORMAL LOW (ref 15–41)
Albumin: 3.2 g/dL — ABNORMAL LOW (ref 3.5–5.0)
Alkaline Phosphatase: 87 U/L (ref 38–126)
Anion gap: 11 (ref 5–15)
BUN: 32 mg/dL — ABNORMAL HIGH (ref 6–20)
CO2: 23 mmol/L (ref 22–32)
Calcium: 8.4 mg/dL — ABNORMAL LOW (ref 8.9–10.3)
Chloride: 99 mmol/L (ref 98–111)
Creatinine, Ser: 1.68 mg/dL — ABNORMAL HIGH (ref 0.61–1.24)
GFR, Estimated: 49 mL/min — ABNORMAL LOW (ref >60)
Glucose, Bld: 459 mg/dL — ABNORMAL HIGH (ref 70–99)
Potassium: 4.8 mmol/L (ref 3.5–5.1)
Sodium: 133 mmol/L — ABNORMAL LOW (ref 135–145)
Total Bilirubin: 1.3 mg/dL — ABNORMAL HIGH (ref 0.3–1.2)
Total Protein: 6.3 g/dL — ABNORMAL LOW (ref 6.5–8.1)

## 2021-12-01 LAB — D-DIMER, QUANTITATIVE: D-Dimer, Quant: 0.79 ug/mL-FEU — ABNORMAL HIGH (ref 0.00–0.50)

## 2021-12-01 LAB — CBG MONITORING, ED: Glucose-Capillary: 234 mg/dL — ABNORMAL HIGH (ref 70–99)

## 2021-12-01 LAB — TROPONIN I (HIGH SENSITIVITY)
Troponin I (High Sensitivity): 8 ng/L (ref <18)
Troponin I (High Sensitivity): 8 ng/L (ref <18)

## 2021-12-01 LAB — BRAIN NATRIURETIC PEPTIDE: B Natriuretic Peptide: 158 pg/mL — ABNORMAL HIGH (ref 0.0–100.0)

## 2021-12-01 MED ORDER — ALUM & MAG HYDROXIDE-SIMETH 200-200-20 MG/5ML PO SUSP
30.0000 mL | Freq: Once | ORAL | Status: AC
  Administered 2021-12-01: 30 mL via ORAL
  Filled 2021-12-01: qty 30

## 2021-12-01 MED ORDER — LORAZEPAM 0.5 MG PO TABS
0.5000 mg | ORAL_TABLET | Freq: Once | ORAL | Status: AC
Start: 2021-12-01 — End: 2021-12-01
  Administered 2021-12-01: 0.5 mg via ORAL
  Filled 2021-12-01: qty 1

## 2021-12-01 MED ORDER — INSULIN ASPART 100 UNIT/ML IJ SOLN
12.0000 [IU] | Freq: Once | INTRAMUSCULAR | Status: AC
Start: 2021-12-01 — End: 2021-12-01
  Administered 2021-12-01: 12 [IU] via SUBCUTANEOUS
  Filled 2021-12-01: qty 1

## 2021-12-01 MED ORDER — IOHEXOL 350 MG/ML SOLN
100.0000 mL | Freq: Once | INTRAVENOUS | Status: AC | PRN
  Administered 2021-12-01: 75 mL via INTRAVENOUS

## 2021-12-01 MED ORDER — SODIUM CHLORIDE 0.9 % IV BOLUS
1000.0000 mL | Freq: Once | INTRAVENOUS | Status: AC
  Administered 2021-12-01: 1000 mL via INTRAVENOUS

## 2021-12-01 MED ORDER — LORAZEPAM 2 MG/ML IJ SOLN: 1.0000 mg | Freq: Once | INTRAMUSCULAR | Status: DC

## 2021-12-01 MED ORDER — FAMOTIDINE 20 MG PO TABS
20.0000 mg | ORAL_TABLET | Freq: Once | ORAL | Status: AC
  Administered 2021-12-01: 20 mg via ORAL
  Filled 2021-12-01: qty 1

## 2021-12-01 MED ORDER — ACETAMINOPHEN 500 MG PO TABS
1000.0000 mg | ORAL_TABLET | Freq: Once | ORAL | Status: AC
  Administered 2021-12-01: 1000 mg via ORAL
  Filled 2021-12-01: qty 2

## 2021-12-01 NOTE — ED Provider Notes (Signed)
[facility] EMERGENCY DEPARTMENT Provider Note   CSN: [id number] Arrival date & time: 12/01/21  1712     History Chief Complaint  Patient presents with   Shortness of Breath    JERI RAWLINS is a 51 y.o. male patient with history of coronary disease, COPD, diabetes, hyperlipidemia, hypertension who presents to the emergency department today for further evaluation of shortness of breath and lightheadedness/vertigo started 3 days ago.  Patient states he suffers from the symptoms from time to time.  He was seen evaluated in this emergency department on 7/1 for similar symptoms.  He states that he was not feeling better when he was discharged.  He states that anytime he exerts himself he gets very dizzy sometimes as a lightheaded sensation and other times with the room started spinning and he feels like he is going to pass out.  Denies any chest pain with these episodes.  He states that he has to take a lot of breaks when he is up moving around secondary to his shortness of breath.  Denies any abdominal pain, diarrhea, nausea, vomiting.   Shortness of Breath      Home Medications Prior to Admission medications   Medication Sig Start Date End Date Taking? Authorizing Provider  albuterol (VENTOLIN HFA) 108 (90 Base) MCG/ACT inhaler INHALE 2 PUFFS BY MOUTH EVERY 6 HOURS AS NEEDED FOR COUGHING, WHEEZING, OR SHORTNESS OF BREATH Patient taking differently: Inhale 2 puffs into the lungs every 6 (six) hours as needed for wheezing or shortness of breath. INHALE 2 PUFFS BY MOUTH EVERY 6 HOURS AS NEEDED FOR COUGHING, WHEEZING, OR SHORTNESS OF BREATH 09/09/21  Yes [name], PA-C  ASPIRIN LOW DOSE 81 MG EC tablet Take 81 mg by mouth every morning. 09/05/20  Yes [provider]  atorvastatin (LIPITOR) 80 MG tablet TAKE 1 Tablet BY MOUTH ONCE EVERY DAY Patient taking differently: Take 80 mg by mouth daily. 09/09/21  Yes [name], PA-C  baclofen (LIORESAL) 10 MG tablet Take 1 tablet  (10 mg total) by mouth 3 (three) times daily. 11/09/21  Yes Dolores Frame, MD  dexlansoprazole (DEXILANT) 60 MG capsule Take 1 capsule every day by oral route. 10/23/14  Yes [provider]  famotidine (PEPCID) 40 MG tablet Take 1 tablet (40 mg total) by mouth 2 (two) times daily. 05/26/21  Yes [name], PA-C  Fluticasone-Salmeterol (ADVAIR DISKUS IN) Inhale 1 puff into the lungs daily. Rinse mouth after use   Yes [provider]  gabapentin (NEURONTIN) 300 MG capsule Take 1 capsule (300 mg total) by mouth every 8 (eight) hours as needed. 10/22/21 12/01/21 Yes Trifan, Kermit Balo, MD  glipiZIDE (GLUCOTROL XL) 5 MG 24 hr tablet Take 1 tablet (5 mg total) by mouth daily with breakfast. 11/10/20  Yes Nida, Denman George, MD  insulin aspart (NOVOLOG) 100 UNIT/ML injection inject 4units before largest meal of day for blood sugar control 02/09/17  Yes [provider]  insulin aspart protamine - aspart (NOVOLOG MIX 70/30 FLEXPEN) (70-30) 100 UNIT/ML FlexPen Inject 50-60 Units into the skin 2 (two) times daily with a meal. Only with breakfast and supper when premeal glucose is above 90 Patient taking differently: Inject 50-60 Units into the skin 2 (two) times daily with a meal. Only with breakfast and supper when premeal glucose is above 90 60 units in the morning and 50 every evening 09/08/21  Yes Nida, Denman George, MD  loperamide (IMODIUM) 2 MG capsule Take 1 capsule (2 mg total) by mouth  4 (four) times daily as needed for diarrhea or loose stools. 11/29/21  Yes Sabas Sous, MD  metFORMIN (GLUCOPHAGE) 500 MG tablet TAKE 1 Tablet  BY MOUTH TWICE DAILY WITH A MEAL 10/28/21  Yes Nida, Denman George, MD  metoprolol tartrate (LOPRESSOR) 25 MG tablet Take 0.5 tablets (12.5 mg total) by mouth 2 (two) times daily. 02/16/21  Yes Dyann Kief, PA-C  nitroGLYCERIN (NITROSTAT) 0.4 MG SL tablet Place 1 tablet (0.4 mg total) under the tongue every 5 (five) minutes as needed  for chest pain. 10/05/17  Yes [name], PA-C  ondansetron (ZOFRAN-ODT) 4 MG disintegrating tablet Take 1 tablet (4 mg total) by mouth every 8 (eight) hours as needed for nausea or vomiting. 11/29/21  Yes Sabas Sous, MD  polyethylene glycol (MIRALAX / GLYCOLAX) 17 g packet Take 17 g by mouth daily. 11/11/20  Yes Pollina, Canary Brim, MD  sertraline (ZOLOFT) 100 MG tablet Take 1 tablet by mouth daily. 01/09/20  Yes [provider]  sucralfate (CARAFATE) 1 g tablet Take 1 tablet (1 g total) by mouth 4 (four) times daily as needed. 11/29/21  Yes Bero, Elmer Sow, MD  acetaminophen-codeine (TYLENOL #3) 300-30 MG tablet     [provider]  ALPRAZolam (XANAX) 1 MG tablet TAKE 1/2 TO 1 TABLET BY MOUTH TWICE A DAY AS NEEDED FOR ANXIETY    [provider]  amoxicillin-clavulanate (AUGMENTIN) 875-125 MG tablet     [provider]  ARIPiprazole (ABILIFY) 10 MG tablet Take 10 mg by mouth at bedtime. 11/10/20   [provider]  azelastine (OPTIVAR) 0.05 % ophthalmic solution INSTILL 1 DROP INTO AFFECTED EYE(S) TWICE A DAY    [provider]  buPROPion ER (WELLBUTRIN SR) 100 MG 12 hr tablet Take 1 tablet by mouth 2 (two) times daily.    [provider]  Empagliflozin-linaGLIPtin (GLYXAMBI) 10-5 MG TABS TAKE 1 TABLET BY MOUTH EVERY MORNING. BEFORE BREAKFAST    [provider]  Fenofibric Acid 105 MG TABS Take 1 tablet every day by oral route.    [provider]  glimepiride (AMARYL) 4 MG tablet TAKE 1 TABLET BY MOUTH EVERY DAY IN THE MORNING    [provider]  HYDROcodone-acetaminophen (NORCO/VICODIN) 5-325 MG tablet Take 1 tablet by mouth every 4 (four) hours as needed.    [provider]  isosorbide mononitrate (IMDUR) 30 MG 24 hr tablet Take 30 mg by mouth daily. 11/30/21   [provider]  metFORMIN (GLUCOPHAGE) 1000 MG tablet Take 1 tablet by mouth 2 (two) times daily with a meal.    [provider]  methocarbamol (ROBAXIN) 500 MG tablet Take 2 tablets 4 times a day by oral route as needed.    [provider]  metoprolol succinate (TOPROL-XL) 50 MG 24 hr tablet Take 1 tablet by mouth daily.    [provider]  nabumetone (RELAFEN) 750 MG tablet     [provider]  traZODone (DESYREL) 50 MG tablet Take 50 mg by mouth at bedtime. Patient not taking: Reported on 12/01/2021 10/03/20   [provider]      Allergies    Omeprazole magnesium, Bee venom, Esomeprazole, and Morphine and related    Review of Systems   Review of Systems  Respiratory:  Positive for shortness of breath.   All other systems reviewed and are negative.   Physical Exam Updated Vital Signs BP 111/77 (BP Location: Left Arm)   Pulse (!) 110   Temp  99.5 F (37.5 C) (Oral)   Resp (!) 37   Ht 5\' 8"  (1.727 m)   Wt 67.1 kg   SpO2 100%   BMI 22.50 kg/m  Physical Exam Vitals and nursing note reviewed.  Constitutional:      General: He is not in acute distress.    Appearance: Normal appearance.  HENT:     Head: Normocephalic and atraumatic.  Eyes:     General:        Right eye: No discharge.        Left eye: No discharge.  Cardiovascular:     Rate and Rhythm: Tachycardia present.     Comments: S1/S2 are distinct without any evidence of murmur, rubs, or gallops.  Radial pulses are 2+ bilaterally.  Dorsalis pedis pulses are 2+ bilaterally.  No evidence of pedal edema. Pulmonary:     Effort: Tachypnea present.     Comments: Clear to auscultation bilaterally. No respiratory distress.  No evidence of wheezes, rales, or rhonchi heard throughout. Abdominal:     General: Abdomen is flat. Bowel sounds are normal. There is no distension.     Tenderness: There is no abdominal tenderness. There is no guarding or rebound.  Musculoskeletal:        General: Normal range of motion.     Cervical back: Neck supple.  Skin:    General: Skin is warm and dry.     Findings: No  rash.  Neurological:     General: No focal deficit present.     Mental Status: He is alert.  Psychiatric:        Mood and Affect: Mood is anxious.        Behavior: Behavior normal.     ED Results / Procedures / Treatments   Labs (all labs ordered are listed, but only abnormal results are displayed) Labs Reviewed  COMPREHENSIVE METABOLIC PANEL  CBC WITH DIFFERENTIAL/PLATELET  D-DIMER, QUANTITATIVE  BRAIN NATRIURETIC PEPTIDE  TROPONIN I (HIGH SENSITIVITY)    EKG EKG Interpretation  Date/Time:  Tuesday December 01 2021 17:32:56 EDT Ventricular Rate:  111 PR Interval:  110 QRS Duration: 129 QT Interval:  346 QTC Calculation: 471 R Axis:   -87 Text Interpretation: Sinus tachycardia RBBB and LAFB No significant change since last tracing Confirmed by Lajean Saver [phone]) on 12/01/2021 5:41:46 PM  Radiology No results found.  Procedures Procedures    Medications Ordered in ED Medications  sodium chloride 0.9 % bolus 1,000 mL (1,000 mLs Intravenous Bolus 12/01/21 1829)    ED Course/ Medical Decision Making/ A&P Clinical Course as of 12/01/21 1834  Tue Dec 01, 2021  1829 Hx CAD, HLD, DM. Just seen on 7/1. Here for shortness of breath, lightheaded/vertigo with exertion. No chest pain. No n/v or abdominal pain. Anticipate discharge pending workup [GL]    Clinical Course User Index [GL] Loeffler, Adora Fridge, PA-C                           Medical Decision Making KINGSTAN VENTURA is a 51 y.o. male patient who presents to the emergency department today for further evaluation of shortness of breath.  Patient does appear anxious tachypneic and tachycardic.  I reviewed the labs, imaging, and notes from 7/1.  These are highlighted in the ED course.  We will be adding on D-dimer and in addition to getting some repeat labs and imaging.  I am also getting a CT head to evaluate for this dizziness.  We will also give the patient a liter of fluids.   Amount and/or Complexity of Data  Reviewed Labs: ordered. Radiology: ordered.  Risk Risk Details: Majority of his work-up is still pending.  Disposition to be made based off work-up.  Due to shift change, his care will be transferred to [facility] PA-C or ultimate disposition will be made.  Patient may need further CT angiography to rule out PE depending on results of D-dimer.   Final Clinical Impression(s) / ED Diagnoses Final diagnoses:  None    Rx / DC Orders ED Discharge Orders     None         Cherrie Gauze 12/01/21 Velta Addison    Lajean Saver, MD 12/01/21 1905

## 2021-12-01 NOTE — ED Notes (Signed)
Put pt in gown and on cardiac monitor 

## 2021-12-01 NOTE — ED Triage Notes (Signed)
Patient with complaints of shortness of breath and dizziness that started on 7/1. He stases that he was seen here on 7/1 for the same problem and has not gotten better.

## 2021-12-01 NOTE — Discharge Instructions (Signed)
We found no emergent causes to your symptoms today.  Please follow up with your PCP. I have also provided you with an ENT referral to have your vertigo worked up. You can call the number provided to schedule an appointment.

## 2021-12-01 NOTE — ED Provider Notes (Signed)
Accepted handoff at shift change from [place], [place]. Please see prior provider note for more detail.   Briefly: Patient is 51 y.o. male past medical history of CAD, COPD, diabetes, HLD, hypertension who presents to the ED today for evaluation of shortness of breath and lightheadedness that started 3 days ago.  "He states that anytime he exerts himself he gets very dizzy sometimes as a lightheaded sensation and other times with the room started spinning and he feels like he is going to pass out.  Denies any chest pain with these episodes.  He states that he has to take a lot of breaks when he is up moving around secondary to his shortness of breath.  Denies any abdominal pain, diarrhea, nausea, vomiting."  Patient is tachycardic and tachypneic, a D-dimer was ordered as well as shortness of breath labs.  CT head was also ordered.  IV fluids started.   Plan: Signout is pending labs, reassessment, and dispo planning   Physical Exam  BP 129/86   Pulse 98   Temp 99.5 F (37.5 C) (Oral)   Resp (!) 23   Ht 5\' 8"  (1.727 m)   Wt 67.1 kg   SpO2 96%   BMI 22.50 kg/m   Physical Exam Vitals and nursing note reviewed.  Constitutional:      General: He is not in acute distress.    Appearance: Normal appearance. He is well-developed. He is not ill-appearing, toxic-appearing or diaphoretic.  HENT:     Head: Normocephalic and atraumatic.     Nose: No nasal deformity.     Mouth/Throat:     Lips: Pink. No lesions.  Eyes:     General: Gaze aligned appropriately. No scleral icterus.       Right eye: No discharge.        Left eye: No discharge.     Conjunctiva/sclera: Conjunctivae normal.     Right eye: Right conjunctiva is not injected. No exudate or hemorrhage.    Left eye: Left conjunctiva is not injected. No exudate or hemorrhage. Pulmonary:     Effort: Pulmonary effort is normal. No respiratory distress.  Skin:    General: Skin is warm and dry.  Neurological:     Mental Status: He is alert  and oriented to person, place, and time.  Psychiatric:        Mood and Affect: Mood normal.        Speech: Speech normal.        Behavior: Behavior normal. Behavior is cooperative.     Procedures  Procedures  ED Course / MDM      Medical Decision Making Amount and/or Complexity of Data Reviewed Labs: ordered. Radiology: ordered.  Risk OTC drugs. Prescription drug management.   I reviewed all labs.  White blood cell count is 10.8 which is nonspecific.  Sodium 133.  Creatinine is 1.68 which is stable from prior.  Troponin is negative x2.  BNP is 158 which is nonspecific.  Chest x-ray shows no evidence of pulmonary edema, cardiomegaly, or any other acute abnormality making CHF less likely.  D-dimer is elevated to 0.79.  A CTA chest was obtained which did not show any evidence of pulmonary embolism or any other acute abnormality.  A CT head also returned with no acute abnormalities.  EKG reveals sinus tach.  He is found to have a glucose of 459 with no anion gap or acidosis.  We treated him with 12 units of insulin.  We repeated his blood glucose and it  was 234.  No further intervention needed for hyperglycemia.  I reviewed prior vital signs, and it looks like patient always runs a little bit tachycardic which is reassuring.  His vitals have remained stable here.  His symptoms have improved after IV fluids, GI cocktail.  I discussed results with patient.  I think dizziness symptoms seem consistent with peripheral vertigo.  I do not think that patient requires admission or has any other acute needs.  We will give him ENT follow-up.  Recommend PCP follow-up as well.  Return precautions provided.  Stable for discharge.       Therese Sarah 12/01/21 2301    Cathren Laine, MD 12/01/21 [phone]

## 2021-12-01 NOTE — ED Notes (Signed)
Went to room pt gone on hall pass to radiology

## 2021-12-06 ENCOUNTER — Encounter (HOSPITAL_COMMUNITY): Payer: Self-pay | Admitting: *Deleted

## 2021-12-06 ENCOUNTER — Other Ambulatory Visit: Payer: Self-pay

## 2021-12-06 ENCOUNTER — Emergency Department (HOSPITAL_COMMUNITY)
Admission: EM | Admit: 2021-12-06 | Discharge: 2021-12-06 | Disposition: A | Discharge status: Home / Self Care | Payer: Medicaid Other | Attending: Emergency Medicine | Admitting: Emergency Medicine

## 2021-12-06 DIAGNOSIS — Z79899 Other long term (current) drug therapy: Secondary | ICD-10-CM | POA: Insufficient documentation

## 2021-12-06 DIAGNOSIS — E860 Dehydration: Secondary | ICD-10-CM

## 2021-12-06 DIAGNOSIS — I2510 Atherosclerotic heart disease of native coronary artery without angina pectoris: Secondary | ICD-10-CM | POA: Insufficient documentation

## 2021-12-06 DIAGNOSIS — Z794 Long term (current) use of insulin: Secondary | ICD-10-CM | POA: Insufficient documentation

## 2021-12-06 DIAGNOSIS — J449 Chronic obstructive pulmonary disease, unspecified: Secondary | ICD-10-CM | POA: Insufficient documentation

## 2021-12-06 DIAGNOSIS — Z7984 Long term (current) use of oral hypoglycemic drugs: Secondary | ICD-10-CM | POA: Insufficient documentation

## 2021-12-06 DIAGNOSIS — I10 Essential (primary) hypertension: Secondary | ICD-10-CM | POA: Insufficient documentation

## 2021-12-06 DIAGNOSIS — E1165 Type 2 diabetes mellitus with hyperglycemia: Secondary | ICD-10-CM | POA: Insufficient documentation

## 2021-12-06 DIAGNOSIS — R739 Hyperglycemia, unspecified: Secondary | ICD-10-CM

## 2021-12-06 DIAGNOSIS — Z7982 Long term (current) use of aspirin: Secondary | ICD-10-CM | POA: Insufficient documentation

## 2021-12-06 LAB — BASIC METABOLIC PANEL
Anion gap: 9 (ref 5–15)
BUN: 24 mg/dL — ABNORMAL HIGH (ref 6–20)
CO2: 26 mmol/L (ref 22–32)
Calcium: 8.6 mg/dL — ABNORMAL LOW (ref 8.9–10.3)
Chloride: 99 mmol/L (ref 98–111)
Creatinine, Ser: 1.88 mg/dL — ABNORMAL HIGH (ref 0.61–1.24)
GFR, Estimated: 42 mL/min — ABNORMAL LOW (ref >60)
Glucose, Bld: 345 mg/dL — ABNORMAL HIGH (ref 70–99)
Potassium: 4.8 mmol/L (ref 3.5–5.1)
Sodium: 134 mmol/L — ABNORMAL LOW (ref 135–145)

## 2021-12-06 LAB — URINALYSIS, ROUTINE W REFLEX MICROSCOPIC
Bilirubin Urine: NEGATIVE
Glucose, UA: >=500 mg/dL — AB
Hgb urine dipstick: NEGATIVE
Ketones, ur: NEGATIVE mg/dL
Leukocytes,Ua: NEGATIVE
Nitrite: NEGATIVE
Protein, ur: 100 mg/dL — AB
Specific Gravity, Urine: 1.027 (ref 1.005–1.030)
pH: 6 (ref 5.0–8.0)

## 2021-12-06 LAB — CBC
HCT: 42.8 % (ref 39.0–52.0)
Hemoglobin: 14.7 g/dL (ref 13.0–17.0)
MCH: 31.3 pg (ref 26.0–34.0)
MCHC: 34.3 g/dL (ref 30.0–36.0)
MCV: 91.1 fL (ref 80.0–100.0)
Platelets: 255 10*3/uL (ref 150–400)
RBC: 4.7 MIL/uL (ref 4.22–5.81)
RDW: 12.7 % (ref 11.5–15.5)
WBC: 11.9 10*3/uL — ABNORMAL HIGH (ref 4.0–10.5)
nRBC: 0 % (ref 0.0–0.2)

## 2021-12-06 LAB — CBG MONITORING, ED
Glucose-Capillary: 458 mg/dL — ABNORMAL HIGH (ref 70–99)
Glucose-Capillary: 193 mg/dL — ABNORMAL HIGH (ref 70–99)

## 2021-12-06 MED ORDER — INSULIN ASPART 100 UNIT/ML IJ SOLN
15.0000 [IU] | Freq: Once | INTRAMUSCULAR | Status: AC
  Administered 2021-12-06: 15 [IU] via SUBCUTANEOUS
  Filled 2021-12-06: qty 1

## 2021-12-06 MED ORDER — SODIUM CHLORIDE 0.9 % IV BOLUS
1000.0000 mL | Freq: Once | INTRAVENOUS | Status: AC
Start: 2021-12-06 — End: 2021-12-06
  Administered 2021-12-06: 1000 mL via INTRAVENOUS

## 2021-12-06 NOTE — ED Provider Notes (Signed)
[facility] EMERGENCY DEPARTMENT Provider Note   CSN: [id number] Arrival date & time: 12/06/21  1934     History  Chief Complaint  Patient presents with   Hyperglycemia    OGLE HOEFFNER is a 51 y.o. male.  HPI    51 year old male with history of CAD, COPD, diabetes, hypertension who comes in with chief complaint of elevated blood sugar.  Patient states that his blood sugars were running high in the 300s in the morning.  He has not really eaten much today, but his blood sugar continued to rise and went over 500, prompting him to come to the ER.  Patient has history of labile diabetes and is on multiple medications.  He indicates that he has been taking his medication as prescribed.  Patient denies any recent URI like symptoms, cough, chest pain, shortness of breath, abdominal pain, vomiting, diarrhea, new rash or any UTI-like symptoms.  Home Medications Prior to Admission medications   Medication Sig Start Date End Date Taking? Authorizing Provider  acetaminophen-codeine (TYLENOL #3) 300-30 MG tablet     [provider]  albuterol (VENTOLIN HFA) 108 (90 Base) MCG/ACT inhaler INHALE 2 PUFFS BY MOUTH EVERY 6 HOURS AS NEEDED FOR COUGHING, WHEEZING, OR SHORTNESS OF BREATH Patient taking differently: Inhale 2 puffs into the lungs every 6 (six) hours as needed for wheezing or shortness of breath. INHALE 2 PUFFS BY MOUTH EVERY 6 HOURS AS NEEDED FOR COUGHING, WHEEZING, OR SHORTNESS OF BREATH 09/09/21   [name], PA-C  ALPRAZolam (XANAX) 1 MG tablet TAKE 1/2 TO 1 TABLET BY MOUTH TWICE A DAY AS NEEDED FOR ANXIETY    [provider]  amoxicillin-clavulanate (AUGMENTIN) 875-125 MG tablet     [provider]  ARIPiprazole (ABILIFY) 10 MG tablet Take 10 mg by mouth at bedtime. 11/10/20   [provider]  ASPIRIN LOW DOSE 81 MG EC tablet Take 81 mg by mouth every morning. 09/05/20   [provider]  atorvastatin (LIPITOR) 80 MG tablet TAKE 1  Tablet BY MOUTH ONCE EVERY DAY Patient taking differently: Take 80 mg by mouth daily. 09/09/21   [name], PA-C  azelastine (OPTIVAR) 0.05 % ophthalmic solution INSTILL 1 DROP INTO AFFECTED EYE(S) TWICE A DAY    [provider]  baclofen (LIORESAL) 10 MG tablet Take 1 tablet (10 mg total) by mouth 3 (three) times daily. 11/09/21   Dolores Frame, MD  buPROPion [facility] SR) 100 MG 12 hr tablet Take 1 tablet by mouth 2 (two) times daily.    [provider]  dexlansoprazole (DEXILANT) 60 MG capsule Take 1 capsule every day by oral route. 10/23/14   [provider]  Empagliflozin-linaGLIPtin (GLYXAMBI) 10-5 MG TABS TAKE 1 TABLET BY MOUTH EVERY MORNING. BEFORE BREAKFAST    [provider]  famotidine (PEPCID) 40 MG tablet Take 1 tablet (40 mg total) by mouth 2 (two) times daily. 05/26/21   [name], PA-C  Fenofibric Acid 105 MG TABS Take 1 tablet every day by oral route.    [provider]  Fluticasone-Salmeterol (ADVAIR DISKUS IN) Inhale 1 puff into the lungs daily. Rinse mouth after use    [provider]  gabapentin (NEURONTIN) 300 MG capsule Take 1 capsule (300 mg total) by mouth every 8 (eight) hours as needed. 10/22/21 12/01/21  Terald Sleeper, MD  glimepiride (AMARYL) 4 MG tablet TAKE 1 TABLET BY MOUTH EVERY DAY IN THE MORNING    [provider]  glipiZIDE (GLUCOTROL XL)  5 MG 24 hr tablet Take 1 tablet (5 mg total) by mouth daily with breakfast. 11/10/20   Nida, Denman George, MD  HYDROcodone-acetaminophen (NORCO/VICODIN) 5-325 MG tablet Take 1 tablet by mouth every 4 (four) hours as needed.    [provider]  insulin aspart (NOVOLOG) 100 UNIT/ML injection inject 4units before largest meal of day for blood sugar control 02/09/17   [provider]  insulin aspart protamine - aspart (NOVOLOG MIX 70/30 FLEXPEN) (70-30) 100 UNIT/ML FlexPen Inject 50-60 Units into the skin 2 (two) times daily  with a meal. Only with breakfast and supper when premeal glucose is above 90 Patient taking differently: Inject 50-60 Units into the skin 2 (two) times daily with a meal. Only with breakfast and supper when premeal glucose is above 90 60 units in the morning and 50 every evening 09/08/21   Roma Kayser, MD  isosorbide mononitrate (IMDUR) 30 MG 24 hr tablet Take 30 mg by mouth daily. 11/30/21   [provider]  loperamide (IMODIUM) 2 MG capsule Take 1 capsule (2 mg total) by mouth 4 (four) times daily as needed for diarrhea or loose stools. 11/29/21   Sabas Sous, MD  metFORMIN (GLUCOPHAGE) 1000 MG tablet Take 1 tablet by mouth 2 (two) times daily with a meal.    [provider]  metFORMIN (GLUCOPHAGE) 500 MG tablet TAKE 1 Tablet  BY MOUTH TWICE DAILY WITH A MEAL 10/28/21   Nida, Denman George, MD  methocarbamol (ROBAXIN) 500 MG tablet Take 2 tablets 4 times a day by oral route as needed.    [provider]  metoprolol succinate (TOPROL-XL) 50 MG 24 hr tablet Take 1 tablet by mouth daily.    [provider]  metoprolol tartrate (LOPRESSOR) 25 MG tablet Take 0.5 tablets (12.5 mg total) by mouth 2 (two) times daily. 02/16/21   Dyann Kief, PA-C  nabumetone (RELAFEN) 750 MG tablet     [provider]  nitroGLYCERIN (NITROSTAT) 0.4 MG SL tablet Place 1 tablet (0.4 mg total) under the tongue every 5 (five) minutes as needed for chest pain. 10/05/17   [name], PA-C  ondansetron (ZOFRAN-ODT) 4 MG disintegrating tablet Take 1 tablet (4 mg total) by mouth every 8 (eight) hours as needed for nausea or vomiting. 11/29/21   Sabas Sous, MD  polyethylene glycol (MIRALAX / GLYCOLAX) 17 g packet Take 17 g by mouth daily. 11/11/20   Gilda Crease, MD  sertraline (ZOLOFT) 100 MG tablet Take 1 tablet by mouth daily. 01/09/20   [provider]  sucralfate (CARAFATE) 1 g tablet Take 1 tablet (1 g total) by mouth 4 (four) times daily as  needed. 11/29/21   Sabas Sous, MD  traZODone (DESYREL) 50 MG tablet Take 50 mg by mouth at bedtime. Patient not taking: Reported on 12/01/2021 10/03/20   [provider]      Allergies    Omeprazole magnesium, Bee venom, Esomeprazole, and Morphine and related    Review of Systems   Review of Systems  All other systems reviewed and are negative.   Physical Exam Updated Vital Signs BP 122/71   Pulse 100   Temp 99.6 F (37.6 C) (Oral)   Resp (!) 22   SpO2 97%  Physical Exam Vitals and nursing note reviewed.  Constitutional:      Appearance: He is well-developed.  HENT:     Head: Atraumatic.     Mouth/Throat:     Mouth: Mucous membranes are  dry.  Eyes:     Extraocular Movements: Extraocular movements intact.     Pupils: Pupils are equal, round, and reactive to light.  Cardiovascular:     Rate and Rhythm: Normal rate.  Pulmonary:     Effort: Pulmonary effort is normal.  Musculoskeletal:     Cervical back: Neck supple.     Right lower leg: No edema.     Left lower leg: No edema.  Skin:    General: Skin is warm.  Neurological:     Mental Status: He is alert and oriented to person, place, and time.     ED Results / Procedures / Treatments   Labs (all labs ordered are listed, but only abnormal results are displayed) Labs Reviewed  BASIC METABOLIC PANEL - Abnormal; Notable for the following components:      Result Value   Sodium 134 (*)    Glucose, Bld 345 (*)    BUN 24 (*)    Creatinine, Ser 1.88 (*)    Calcium 8.6 (*)    GFR, Estimated 42 (*)    All other components within normal limits  CBC - Abnormal; Notable for the following components:   WBC 11.9 (*)    All other components within normal limits  URINALYSIS, ROUTINE W REFLEX MICROSCOPIC - Abnormal; Notable for the following components:   APPearance HAZY (*)    Glucose, UA >=500 (*)    Protein, ur 100 (*)    Bacteria, UA FEW (*)    All other components within normal limits  CBG MONITORING, ED -  Abnormal; Notable for the following components:   Glucose-Capillary 458 (*)    All other components within normal limits  CBG MONITORING, ED - Abnormal; Notable for the following components:   Glucose-Capillary 193 (*)    All other components within normal limits    EKG None  Radiology No results found.  Procedures Procedures    Medications Ordered in ED Medications  sodium chloride 0.9 % bolus 1,000 mL (1,000 mLs Intravenous New Bag/Given 12/06/21 2103)  insulin aspart (novoLOG) injection 15 Units (15 Units Subcutaneous Given 12/06/21 2102)    ED Course/ Medical Decision Making/ A&P                           Medical Decision Making Amount and/or Complexity of Data Reviewed Labs: ordered.  Risk Prescription drug management.   This patient presents to the ED with chief complaint(s) of elevated blood sugar with pertinent past medical history of labile diabetes with multiple antihyperglycemics on board which further complicates the presenting complaint. The complaint involves an extensive differential diagnosis and also carries with it a high risk of complications and morbidity.    The differential diagnosis includes : Diabetic ketoacidosis, hyperglycemia without ketosis, noncompliance with medication, labile diabetes, severe electrolyte abnormality, new renal failure, infection.  The history is really not indicated infection.  Patient does not have any cough, fevers, headaches, severe nausea or vomiting or UTI-like symptoms.  The initial plan is to get basic labs, also screening for infection by getting urine analysis.   Additional history obtained: Records reviewed Primary Care Documents -evaluated note from endocrinology and reviewed patient's diabetes management  Independent labs interpretation:  The following labs were independently interpreted: Patient's work-up is reassuring, he does not have severe renal failure, severe electrolyte abnormality and his anion gap is 9  with mild elevated blood sugar.  He has received subcu insulin.  Urine analysis does  not show any ketones or signs of infection. Patient has not had any emesis here.  After subcutaneous insulin, his blood sugar has dropped to 190.  He is stable for discharge with strict ER return precautions.  Advised him that he follows up with his endocrinologist and checks his blood sugar frequently.   Final Clinical Impression(s) / ED Diagnoses Final diagnoses:  Hyperglycemia  Dehydration    Rx / DC Orders ED Discharge Orders     None         Derwood Kaplan, MD 12/06/21 2215

## 2021-12-06 NOTE — Discharge Instructions (Addendum)
You are seen in the ER for elevated blood sugar. Our work-up is overall reassuring.  There is no evidence of any organ damage and the blood sugar was corrected with IV fluids and insulin.  We do not have any clear evidence of infection either.  Please continue to hydrate well.  Please take your diabetes medications as prescribed.  Make sure to check your blood sugar at least 3 times a day to ensure it is stable.  Return to the ER if your blood sugar is elevated despite taking your medications over 500 or you start having fevers, chills, severe nausea and vomiting.

## 2021-12-06 NOTE — ED Triage Notes (Signed)
Pt with elevated blood sugars at home, in 300's this morning and last at 596.  C/o HA for past hour.

## 2021-12-13 ENCOUNTER — Observation Stay (HOSPITAL_COMMUNITY)
Admission: EM | Admit: 2021-12-13 | Discharge: 2021-12-13 | Discharge status: Against Medical Advice | Payer: Medicaid Other | Attending: Internal Medicine | Admitting: Internal Medicine

## 2021-12-13 ENCOUNTER — Other Ambulatory Visit: Payer: Self-pay

## 2021-12-13 ENCOUNTER — Encounter (HOSPITAL_COMMUNITY): Payer: Self-pay

## 2021-12-13 ENCOUNTER — Emergency Department (HOSPITAL_COMMUNITY): Payer: Medicaid Other

## 2021-12-13 DIAGNOSIS — Z7984 Long term (current) use of oral hypoglycemic drugs: Secondary | ICD-10-CM | POA: Insufficient documentation

## 2021-12-13 DIAGNOSIS — Z7982 Long term (current) use of aspirin: Secondary | ICD-10-CM | POA: Insufficient documentation

## 2021-12-13 DIAGNOSIS — D72829 Elevated white blood cell count, unspecified: Secondary | ICD-10-CM | POA: Diagnosis present

## 2021-12-13 DIAGNOSIS — E1140 Type 2 diabetes mellitus with diabetic neuropathy, unspecified: Secondary | ICD-10-CM | POA: Insufficient documentation

## 2021-12-13 DIAGNOSIS — E162 Hypoglycemia, unspecified: Secondary | ICD-10-CM

## 2021-12-13 DIAGNOSIS — E11649 Type 2 diabetes mellitus with hypoglycemia without coma: Secondary | ICD-10-CM | POA: Insufficient documentation

## 2021-12-13 DIAGNOSIS — K219 Gastro-esophageal reflux disease without esophagitis: Secondary | ICD-10-CM | POA: Diagnosis present

## 2021-12-13 DIAGNOSIS — N289 Disorder of kidney and ureter, unspecified: Secondary | ICD-10-CM

## 2021-12-13 DIAGNOSIS — F419 Anxiety disorder, unspecified: Secondary | ICD-10-CM | POA: Diagnosis present

## 2021-12-13 DIAGNOSIS — F17210 Nicotine dependence, cigarettes, uncomplicated: Secondary | ICD-10-CM | POA: Insufficient documentation

## 2021-12-13 DIAGNOSIS — Z79899 Other long term (current) drug therapy: Secondary | ICD-10-CM | POA: Insufficient documentation

## 2021-12-13 DIAGNOSIS — J45909 Unspecified asthma, uncomplicated: Secondary | ICD-10-CM | POA: Insufficient documentation

## 2021-12-13 DIAGNOSIS — R6510 Systemic inflammatory response syndrome (SIRS) of non-infectious origin without acute organ dysfunction: Secondary | ICD-10-CM | POA: Diagnosis present

## 2021-12-13 DIAGNOSIS — R55 Syncope and collapse: Principal | ICD-10-CM | POA: Diagnosis present

## 2021-12-13 DIAGNOSIS — F17200 Nicotine dependence, unspecified, uncomplicated: Secondary | ICD-10-CM | POA: Diagnosis present

## 2021-12-13 DIAGNOSIS — I452 Bifascicular block: Secondary | ICD-10-CM | POA: Diagnosis present

## 2021-12-13 DIAGNOSIS — Z794 Long term (current) use of insulin: Secondary | ICD-10-CM

## 2021-12-13 DIAGNOSIS — J449 Chronic obstructive pulmonary disease, unspecified: Secondary | ICD-10-CM | POA: Insufficient documentation

## 2021-12-13 DIAGNOSIS — I10 Essential (primary) hypertension: Secondary | ICD-10-CM | POA: Insufficient documentation

## 2021-12-13 DIAGNOSIS — K224 Dyskinesia of esophagus: Secondary | ICD-10-CM | POA: Diagnosis present

## 2021-12-13 DIAGNOSIS — I2510 Atherosclerotic heart disease of native coronary artery without angina pectoris: Secondary | ICD-10-CM | POA: Insufficient documentation

## 2021-12-13 LAB — CBG MONITORING, ED
Glucose-Capillary: 49 mg/dL — ABNORMAL LOW (ref 70–99)
Glucose-Capillary: 122 mg/dL — ABNORMAL HIGH (ref 70–99)
Glucose-Capillary: 61 mg/dL — ABNORMAL LOW (ref 70–99)
Glucose-Capillary: 57 mg/dL — ABNORMAL LOW (ref 70–99)
Glucose-Capillary: 119 mg/dL — ABNORMAL HIGH (ref 70–99)

## 2021-12-13 LAB — CBC
HCT: 46.1 % (ref 39.0–52.0)
Hemoglobin: 15.8 g/dL (ref 13.0–17.0)
MCH: 31.2 pg (ref 26.0–34.0)
MCHC: 34.3 g/dL (ref 30.0–36.0)
MCV: 90.9 fL (ref 80.0–100.0)
Platelets: 364 10*3/uL (ref 150–400)
RBC: 5.07 MIL/uL (ref 4.22–5.81)
RDW: 13.1 % (ref 11.5–15.5)
WBC: 15.3 10*3/uL — ABNORMAL HIGH (ref 4.0–10.5)
nRBC: 0 % (ref 0.0–0.2)

## 2021-12-13 LAB — BASIC METABOLIC PANEL
Anion gap: 9 (ref 5–15)
BUN: 20 mg/dL (ref 6–20)
CO2: 23 mmol/L (ref 22–32)
Calcium: 8.6 mg/dL — ABNORMAL LOW (ref 8.9–10.3)
Chloride: 105 mmol/L (ref 98–111)
Creatinine, Ser: 1.48 mg/dL — ABNORMAL HIGH (ref 0.61–1.24)
GFR, Estimated: 57 mL/min — ABNORMAL LOW (ref >60)
Glucose, Bld: 59 mg/dL — ABNORMAL LOW (ref 70–99)
Potassium: 3.8 mmol/L (ref 3.5–5.1)
Sodium: 137 mmol/L (ref 135–145)

## 2021-12-13 LAB — MAGNESIUM: Magnesium: 1.9 mg/dL (ref 1.7–2.4)

## 2021-12-13 LAB — LACTIC ACID, PLASMA: Lactic Acid, Venous: 1.8 mmol/L (ref 0.5–1.9)

## 2021-12-13 LAB — TROPONIN I (HIGH SENSITIVITY)
Troponin I (High Sensitivity): 6 ng/L (ref <18)
Troponin I (High Sensitivity): 6 ng/L (ref <18)

## 2021-12-13 LAB — GLUCOSE, CAPILLARY: Glucose-Capillary: 119 mg/dL — ABNORMAL HIGH (ref 70–99)

## 2021-12-13 LAB — TSH: TSH: 3.482 u[IU]/mL (ref 0.350–4.500)

## 2021-12-13 MED ORDER — ONDANSETRON HCL 4 MG PO TABS: 4.0000 mg | ORAL_TABLET | Freq: Four times a day (QID) | ORAL | Status: DC | PRN

## 2021-12-13 MED ORDER — ALBUTEROL SULFATE (2.5 MG/3ML) 0.083% IN NEBU: 2.5000 mg | INHALATION_SOLUTION | Freq: Four times a day (QID) | RESPIRATORY_TRACT | Status: DC | PRN

## 2021-12-13 MED ORDER — DEXTROSE 50 % IV SOLN
50.0000 mL | Freq: Once | INTRAVENOUS | Status: AC
  Administered 2021-12-13: 50 mL via INTRAVENOUS

## 2021-12-13 MED ORDER — ACETAMINOPHEN 650 MG RE SUPP: 650.0000 mg | Freq: Four times a day (QID) | RECTAL | Status: DC | PRN

## 2021-12-13 MED ORDER — DEXTROSE 50 % IV SOLN: 50.0000 mL | Freq: Once | INTRAVENOUS | Status: DC

## 2021-12-13 MED ORDER — SODIUM CHLORIDE 0.9% FLUSH: 3.0000 mL | Freq: Two times a day (BID) | INTRAVENOUS | Status: DC

## 2021-12-13 MED ORDER — ONDANSETRON HCL 4 MG/2ML IJ SOLN: 4.0000 mg | Freq: Four times a day (QID) | INTRAMUSCULAR | Status: DC | PRN

## 2021-12-13 MED ORDER — ACETAMINOPHEN 325 MG PO TABS: 650.0000 mg | ORAL_TABLET | Freq: Four times a day (QID) | ORAL | Status: DC | PRN

## 2021-12-13 MED ORDER — SODIUM CHLORIDE 0.9 % IV BOLUS
500.0000 mL | Freq: Once | INTRAVENOUS | Status: AC
  Administered 2021-12-13: 500 mL via INTRAVENOUS

## 2021-12-13 MED ORDER — DEXTROSE 10 % IV SOLN: INTRAVENOUS | Status: DC

## 2021-12-13 MED ORDER — IOHEXOL 350 MG/ML SOLN
75.0000 mL | Freq: Once | INTRAVENOUS | Status: AC | PRN
Start: 2021-12-13 — End: 2021-12-13
  Administered 2021-12-13: 75 mL via INTRAVENOUS

## 2021-12-13 MED ORDER — DEXTROSE 50 % IV SOLN: 1.0000 | INTRAVENOUS | Status: DC | PRN

## 2021-12-13 MED ORDER — DEXTROSE 50 % IV SOLN
INTRAVENOUS | Status: AC
  Filled 2021-12-13: qty 50

## 2021-12-13 MED ORDER — ENOXAPARIN SODIUM 40 MG/0.4ML IJ SOSY
40.0000 mg | PREFILLED_SYRINGE | INTRAMUSCULAR | Status: DC
Start: 2021-12-13 — End: 2021-12-14

## 2021-12-13 NOTE — Progress Notes (Signed)
Report taken of pt. ED nurse stated she would transport pt to 300 unit following report.

## 2021-12-13 NOTE — ED Triage Notes (Signed)
Pt states he is light headed and SOB. Pt states he can't walk. Pt states when he tries to walk it feel like "something is weighing me down like I'm crashing." Also reports feeling dizzy. Started in the beginning of July and has continued since being hospitalized. Pt reports passing out yesterday.

## 2021-12-13 NOTE — ED Provider Notes (Signed)
[facility] EMERGENCY DEPARTMENT Provider Note   CSN: [id number] Arrival date & time: 12/13/21  1203     History  Chief Complaint  Patient presents with   Loss of Consciousness   Dizziness    [name] [name] is a 51 y.o. male.  [name] [name] is a 51 y.o. male with history of diabetes, hypertension, hyperlipidemia, COPD, CAD, who presents to the emergency department for evaluation of syncopal episode.  Patient reports that he was standing on his porch yesterday afternoon when he started to feel very lightheaded and he reports a pressure-like sensation in his head then started to feel dizzy and like he was going to pass out and then woke up on his porch.  He reports he was leaning over the railing and fell backwards and hit his head.  He reports he has been having frequent issues with feeling dizzy and lightheaded recently and in general has been feeling fatigued and unwell with some shortness of breath.  He reports an occasional chest tightness although denies chest tightness prior to syncopal episode yesterday.  He reports that he sometimes feels like the room is spinning and other times feels as though he is going to pass out and has passed out previously but this is the first time it is happened recently.  He has had some nausea but no focal abdominal pain and reports he has had very little appetite.  Reports his blood sugar has been up and down, most recent blood sugar check was 170 yesterday at home.  He reports that he did not come in yesterday after the syncopal episode because instead he just went to bed.  He has been seen 3 times already for somewhat similar symptoms since 7/1 with work-up including negative PE study, negative abdominal scan and negative noncontrasted head CT.  The history is provided by the patient and medical records.  Loss of Consciousness Associated symptoms: dizziness, headaches, nausea and shortness of breath   Associated symptoms: no chest pain, no fever, no  palpitations and no vomiting   Dizziness Associated symptoms: diarrhea, headaches, nausea, shortness of breath and syncope   Associated symptoms: no blood in stool, no chest pain, no palpitations and no vomiting        Home Medications Prior to Admission medications   Medication Sig Start Date End Date Taking? Authorizing Provider  acetaminophen-codeine (TYLENOL #3) 300-30 MG tablet     [provider]  albuterol (VENTOLIN HFA) 108 (90 Base) MCG/ACT inhaler INHALE 2 PUFFS BY MOUTH EVERY 6 HOURS AS NEEDED FOR COUGHING, WHEEZING, OR SHORTNESS OF BREATH Patient taking differently: Inhale 2 puffs into the lungs every 6 (six) hours as needed for wheezing or shortness of breath. INHALE 2 PUFFS BY MOUTH EVERY 6 HOURS AS NEEDED FOR COUGHING, WHEEZING, OR SHORTNESS OF BREATH 09/09/21   [name], PA-C  ALPRAZolam (XANAX) 1 MG tablet TAKE 1/2 TO 1 TABLET BY MOUTH TWICE A DAY AS NEEDED FOR ANXIETY    [provider]  amoxicillin-clavulanate (AUGMENTIN) 875-125 MG tablet     [provider]  ARIPiprazole (ABILIFY) 10 MG tablet Take 10 mg by mouth at bedtime. 11/10/20   [provider]  ASPIRIN LOW DOSE 81 MG EC tablet Take 81 mg by mouth every morning. 09/05/20   [provider]  atorvastatin (LIPITOR) 80 MG tablet TAKE 1 Tablet BY MOUTH ONCE EVERY DAY Patient taking differently: Take 80 mg by mouth daily. 09/09/21   [name], PA-C  azelastine (OPTIVAR) 0.05 %  ophthalmic solution INSTILL 1 DROP INTO AFFECTED EYE(S) TWICE A DAY    [provider]  baclofen (LIORESAL) 10 MG tablet Take 1 tablet (10 mg total) by mouth 3 (three) times daily. 11/09/21   Dolores Frameastaneda Mayorga, Daniel, MD  buPROPion [facility]WELLBUTRIN SR) 100 MG 12 hr tablet Take 1 tablet by mouth 2 (two) times daily.    [provider]  dexlansoprazole (DEXILANT) 60 MG capsule Take 1 capsule every day by oral route. 10/23/14   [provider]  Empagliflozin-linaGLIPtin  (GLYXAMBI) 10-5 MG TABS TAKE 1 TABLET BY MOUTH EVERY MORNING. BEFORE BREAKFAST    [provider]  famotidine (PEPCID) 40 MG tablet Take 1 tablet (40 mg total) by mouth 2 (two) times daily. 05/26/21   Jacquelin HawkingMcElroy, Shannon, PA-C  Fenofibric Acid 105 MG TABS Take 1 tablet every day by oral route.    [provider]  Fluticasone-Salmeterol (ADVAIR DISKUS IN) Inhale 1 puff into the lungs daily. Rinse mouth after use    [provider]  gabapentin (NEURONTIN) 300 MG capsule Take 1 capsule (300 mg total) by mouth every 8 (eight) hours as needed. 10/22/21 12/01/21  Terald Sleeperrifan, Matthew J, MD  glimepiride (AMARYL) 4 MG tablet TAKE 1 TABLET BY MOUTH EVERY DAY IN THE MORNING    [provider]  glipiZIDE (GLUCOTROL XL) 5 MG 24 hr tablet Take 1 tablet (5 mg total) by mouth daily with breakfast. 11/10/20   Nida, Denman GeorgeGebreselassie W, MD  HYDROcodone-acetaminophen (NORCO/VICODIN) 5-325 MG tablet Take 1 tablet by mouth every 4 (four) hours as needed.    [provider]  insulin aspart (NOVOLOG) 100 UNIT/ML injection inject 4units before largest meal of day for blood sugar control 02/09/17   [provider]  insulin aspart protamine - aspart (NOVOLOG MIX 70/30 FLEXPEN) (70-30) 100 UNIT/ML FlexPen Inject 50-60 Units into the skin 2 (two) times daily with a meal. Only with breakfast and supper when premeal glucose is above 90 Patient taking differently: Inject 50-60 Units into the skin 2 (two) times daily with a meal. Only with breakfast and supper when premeal glucose is above 90 60 units in the morning and 50 every evening 09/08/21   Roma KayserNida, Gebreselassie W, MD  isosorbide mononitrate (IMDUR) 30 MG 24 hr tablet Take 30 mg by mouth daily. 11/30/21   [provider]  loperamide (IMODIUM) 2 MG capsule Take 1 capsule (2 mg total) by mouth 4 (four) times daily as needed for diarrhea or loose stools. 11/29/21   Sabas SousBero, Michael M, MD  metFORMIN (GLUCOPHAGE) 1000 MG tablet Take 1 tablet by  mouth 2 (two) times daily with a meal.    [provider]  metFORMIN (GLUCOPHAGE) 500 MG tablet TAKE 1 Tablet  BY MOUTH TWICE DAILY WITH A MEAL 10/28/21   Nida, Denman GeorgeGebreselassie W, MD  methocarbamol (ROBAXIN) 500 MG tablet Take 2 tablets 4 times a day by oral route as needed.    [provider]  metoprolol succinate (TOPROL-XL) 50 MG 24 hr tablet Take 1 tablet by mouth daily.    [provider]  metoprolol tartrate (LOPRESSOR) 25 MG tablet Take 0.5 tablets (12.5 mg total) by mouth 2 (two) times daily. 02/16/21   Dyann KiefLenze, Michele M, PA-C  nabumetone (RELAFEN) 750 MG tablet     [provider]  nitroGLYCERIN (NITROSTAT) 0.4 MG SL tablet Place 1 tablet (0.4 mg total) under the tongue every 5 (five) minutes as needed for chest pain. 10/05/17   Jacquelin HawkingMcElroy, Shannon, PA-C  ondansetron (ZOFRAN-ODT) 4  MG disintegrating tablet Take 1 tablet (4 mg total) by mouth every 8 (eight) hours as needed for nausea or vomiting. 11/29/21   Sabas Sous, MD  polyethylene glycol (MIRALAX / GLYCOLAX) 17 g packet Take 17 g by mouth daily. 11/11/20   Gilda Crease, MD  sertraline (ZOLOFT) 100 MG tablet Take 1 tablet by mouth daily. 01/09/20   [provider]  sucralfate (CARAFATE) 1 g tablet Take 1 tablet (1 g total) by mouth 4 (four) times daily as needed. 11/29/21   Sabas Sous, MD  traZODone (DESYREL) 50 MG tablet Take 50 mg by mouth at bedtime. Patient not taking: Reported on 12/01/2021 10/03/20   [provider]      Allergies    Omeprazole magnesium, Bee venom, Esomeprazole, and Morphine and related    Review of Systems   Review of Systems  Constitutional:  Positive for fatigue. Negative for chills and fever.  Respiratory:  Positive for chest tightness and shortness of breath. Negative for cough.   Cardiovascular:  Positive for syncope. Negative for chest pain, palpitations and leg swelling.  Gastrointestinal:  Positive for diarrhea and nausea. Negative for abdominal  pain, blood in stool and vomiting.  Genitourinary:  Negative for dysuria and frequency.  Neurological:  Positive for dizziness, syncope, light-headedness and headaches.    Physical Exam Updated Vital Signs BP (!) 153/94   Pulse 98   Temp 98.8 F (37.1 C) (Oral)   Resp 17   Ht 5\' 8"  (1.727 m)   Wt 66.2 kg   SpO2 99%   BMI 22.20 kg/m   Physical Exam Vitals and nursing note reviewed.  Constitutional:      General: He is not in acute distress.    Appearance: Normal appearance. He is well-developed. He is not ill-appearing or diaphoretic.  HENT:     Head: Normocephalic and atraumatic.     Mouth/Throat:     Mouth: Mucous membranes are moist.     Pharynx: Oropharynx is clear.  Eyes:     General:        Right eye: No discharge.        Left eye: No discharge.     Extraocular Movements: Extraocular movements intact.     Pupils: Pupils are equal, round, and reactive to light.  Cardiovascular:     Rate and Rhythm: Regular rhythm. Tachycardia present.     Pulses: Normal pulses.     Heart sounds: Normal heart sounds.  Pulmonary:     Effort: Pulmonary effort is normal. No respiratory distress.     Breath sounds: Normal breath sounds. No wheezing or rales.     Comments: Respirations equal and unlabored, patient able to speak in full sentences, lungs clear to auscultation bilaterally  Abdominal:     General: Bowel sounds are normal. There is no distension.     Palpations: Abdomen is soft. There is no mass.     Tenderness: There is no abdominal tenderness. There is no guarding.     Comments: Abdomen soft, nondistended, nontender to palpation in all quadrants without guarding or peritoneal signs  Musculoskeletal:        General: No deformity.     Cervical back: Neck supple.  Skin:    General: Skin is warm and dry.     Capillary Refill: Capillary refill takes less than 2 seconds.  Neurological:     Mental Status: He is alert and oriented to person, place, and time.      Coordination: Coordination  normal.     Comments: Speech is clear, able to follow commands CN III-XII intact Normal strength in upper and lower extremities bilaterally including dorsiflexion and plantar flexion, strong and equal grip strength Sensation normal to light and sharp touch Moves extremities without ataxia, coordination intact  Psychiatric:        Mood and Affect: Mood normal.        Behavior: Behavior normal.     ED Results / Procedures / Treatments   Labs (all labs ordered are listed, but only abnormal results are displayed) Labs Reviewed  BASIC METABOLIC PANEL - Abnormal; Notable for the following components:      Result Value   Glucose, Bld 59 (*)    Creatinine, Ser 1.48 (*)    Calcium 8.6 (*)    GFR, Estimated 57 (*)    All other components within normal limits  CBC - Abnormal; Notable for the following components:   WBC 15.3 (*)    All other components within normal limits  HEMOGLOBIN A1C - Abnormal; Notable for the following components:   Hgb A1c MFr Bld 13.7 (*)    All other components within normal limits  GLUCOSE, CAPILLARY - Abnormal; Notable for the following components:   Glucose-Capillary 119 (*)    All other components within normal limits  CBG MONITORING, ED - Abnormal; Notable for the following components:   Glucose-Capillary 49 (*)    All other components within normal limits  CBG MONITORING, ED - Abnormal; Notable for the following components:   Glucose-Capillary 122 (*)    All other components within normal limits  CBG MONITORING, ED - Abnormal; Notable for the following components:   Glucose-Capillary 61 (*)    All other components within normal limits  CBG MONITORING, ED - Abnormal; Notable for the following components:   Glucose-Capillary 57 (*)    All other components within normal limits  CBG MONITORING, ED - Abnormal; Notable for the following components:   Glucose-Capillary 119 (*)    All other components within normal limits   CULTURE, BLOOD (SINGLE)  HIV ANTIBODY (ROUTINE TESTING W REFLEX)  MAGNESIUM  TSH  LACTIC ACID, PLASMA  TROPONIN I (HIGH SENSITIVITY)  TROPONIN I (HIGH SENSITIVITY)    EKG EKG Interpretation  Date/Time:  Sunday December 13 2021 13:29:16 EDT Ventricular Rate:  106 PR Interval:  122 QRS Duration: 132 QT Interval:  359 QTC Calculation: 477 R Axis:   -72 Text Interpretation: 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 Syncope/presyncope. Dizziness since beginning of July. EXAM: CT ANGIOGRAPHY HEAD AND NECK TECHNIQUE: Multidetector CT imaging of the head and neck was performed using the standard protocol during bolus administration of intravenous contrast. Multiplanar CT image reconstructions and MIPs were obtained to evaluate the vascular anatomy. Carotid stenosis measurements (when applicable) are obtained utilizing NASCET criteria, using the distal internal carotid diameter as the denominator. RADIATION DOSE REDUCTION: This exam was performed according to the departmental dose-optimization program which includes automated exposure control, adjustment of the mA and/or kV according to patient size and/or use of iterative reconstruction technique. CONTRAST:  36mL OMNIPAQUE IOHEXOL 350 MG/ML SOLN COMPARISON:  CT head without contrast 12/01/2021 FINDINGS: CT HEAD FINDINGS Brain: No acute infarct, hemorrhage, or mass lesion is present. No significant white matter lesions are present. The ventricles are of normal  size. No significant extraaxial fluid collection is present. The brainstem and cerebellum are within normal limits. Vascular: Faint calcifications are present within the cavernous internal carotid arteries bilaterally. No hyperdense vessel is present. Skull:  Calvarium is intact. No focal lytic or blastic lesions are present. No significant extracranial soft tissue lesion is present. Sinuses: The paranasal sinuses and mastoid air cells are clear. Orbits: The globes and orbits are within normal limits. Review of the MIP images confirms the above findings CTA NECK FINDINGS Aortic arch: Atherosclerotic calcifications are present at the great vessel origins. No significant stenosis is present. Right carotid system: The right common carotid artery is within normal limits. Atherosclerotic changes are present bifurcation. Lumen is narrowed to 2.5 mm. This compares with a distal right ICA 5.0 mm. The cervical right ICA is otherwise normal. Left carotid system: The left common carotid artery is within normal limits. Mild atherosclerotic changes are present at the bifurcation. No significant stenosis is present. The cervical left ICA is normal. Vertebral arteries: Left vertebral artery is the dominant vessel. Noncalcified plaque is present in the wall of the left subclavian artery extending into the origin with moderate stenosis of the proximal left vertebral artery right vertebral artery origin is within normal limits. No significant stenosis is present otherwise in either vertebral artery. Skeleton: Degenerative changes the cervical spine are most evident at C5-6 and C6-7. Right foraminal narrowing is present at C5-6. Left greater than right foraminal narrowing is present at C6-7. The patient is edentulous. No focal osseous lesions are present. Other neck: Soft tissues the neck are otherwise unremarkable. Salivary glands are within normal limits. Thyroid is normal. No significant adenopathy is present. No focal mucosal or submucosal lesions are present. Upper chest: The lung apices are clear. Thoracic inlet is within normal limits. Review of the MIP images confirms the above findings CTA HEAD FINDINGS Anterior circulation: Minimal atherosclerotic calcifications are present  within the cavernous internal carotid arteries bilaterally. No significant stenosis is present. ICA termini are within normal limits bilaterally. The A1 and M1 segments fill normally. The anterior communicating artery is patent. MCA bifurcations are normal. The ACA and MCA branch vessels are within normal limits. Posterior circulation: The left vertebral artery is the dominant vessel. PICA origins are visualized and normal. Vertebrobasilar junction and basilar artery is normal. Both posterior cerebral arteries originate from the basilar tip. PCA branch vessels are normal bilaterally. Venous sinuses: The dural sinuses are patent. The straight sinus deep cerebral veins are intact. Cortical veins are within normal limits. No significant vascular malformation is evident. Anatomic variants: None Review of the MIP images confirms the above findings IMPRESSION: 1. 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 2. Mild atherosclerotic changes at the left carotid bifurcation without significant stenosis. 3. 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 4. Normal variant CTA Circle of Willis without significant proximal stenosis, aneurysm, or branch vessel occlusion. 5. Degenerative changes of the cervical spine as described. 6. Aortic Atherosclerosis (ICD10-I70.0). Electronically Signed   By: Marin Roberts M.D.   On: 12/13/2021 15:51   DG Chest Port 1 View  Result Date: 12/13/2021 CLINICAL DATA:  Shortness of breath EXAM: PORTABLE CHEST 1 VIEW COMPARISON:  12/01/2021 and prior studies FINDINGS: The cardiomediastinal silhouette is unremarkable. There is no evidence of focal airspace disease, pulmonary edema, suspicious pulmonary nodule/mass, pleural effusion, or pneumothorax. No acute bony abnormalities are identified. IMPRESSION: No active disease.  Electronically Signed   By: Harmon Pier M.D.   On:  12/13/2021 15:23     Procedures Procedures    Medications Ordered in ED Medications  sodium chloride 0.9 % bolus 500 mL (has no administration in time range)    ED Course/ Medical Decision Making/ A&P                           Medical Decision Making Amount and/or Complexity of Data Reviewed Labs: ordered. Radiology: ordered.  Risk Prescription drug management. Decision regarding hospitalization.   RC AMISON is a 51 y.o. male presents to the ED for concern of syncope, this involves an extensive number of treatment options, and is a complaint that carries with it a high risk of complications and morbidity.  The differential diagnosis includes dehydration, arrhythmia, vasovagal syncope, electrolyte derangement, hypoglycemia, seizure, aneurysm, head injury, concussion, intracranial bleed   Additional history obtained:  Additional history obtained from chart review External records from outside source obtained and reviewed including multiple prior ED evaluations for similar symptoms   Lab Tests:  I Ordered, reviewed, and interpreted labs.  The pertinent results include: Mild leukocytosis of 15.3, normal hemoglobin, glucose of 59, D50 given, renal function at baseline, TSH normal, troponin normal, lactic acid normal, magnesium normal.   Imaging Studies ordered:  I ordered imaging studies including chest x-ray, CT angio head and neck I independently visualized and interpreted imaging which showed no active cardiopulmonary disease.  There is 50% stenosis in the proximal right internal carotid artery at the bifurcation, mild atherosclerotic changes noted in the left carotid moderate stenosis of the proximal left vertebral artery I agree with the radiologist interpretation   Cardiac Monitoring:  The patient was maintained on a cardiac monitor.  I personally viewed and interpreted the cardiac monitored which showed an underlying rhythm of: Sinus tachycardia, improved to  normal sinus rhythm with IV fluids   Medicines ordered and prescription drug management:  I ordered medication including D50 for hypoglycemia, IV fluid bolus Reevaluation of the patient after these medicines showed that the patient improved I have reviewed the patients home medicines and have made adjustments as needed   ED Course:  Patient presents for evaluation of syncope, has been seen multiple times for some dizziness and near syncopal symptoms as well as nausea vomiting, passed out and hit his head.  Reports some intermittent headache and dizziness associated with symptoms as well. Found to have hypoglycemia, given D50 and will continue to monitor blood sugars. CTA with some moderate stenosis, most severe in the right ICA but neurology feels this is not clinically significant enough to be causing patient's symptoms, no further emergent intervention needed. Patient also appears mildly dehydrated, given fluids with improvement in tachycardia.  Considered PE in the setting of tachycardia as well but patient recently had negative PE study with similar symptoms. Given ongoing symptoms with multiple prior evaluations now with syncopal episodes, recommend hospital admission. Patient also had recurrent episode of hypoglycemia despite D50   Consultations Obtained:  I requested consultation with neurology regarding vascular findings on CTA.  Discussed case with Dr. Otelia Limes who did not feel that these areas of stenosis were significant and likely to be causing his syncopal symptoms. I requested consultation with the hospitalist for admission,  and discussed lab and imaging findings as well as pertinent plan -Dr. Katrinka Blazing will see and admit patient for hypoglycemia and syncope    Dispostion:  After consideration of the  diagnostic results and the patients response to treatment feel that the patent would benefit from admission.          Final Clinical Impression(s) / ED Diagnoses Final  diagnoses:  Syncope, unspecified syncope type  Hypoglycemia    Rx / DC Orders ED Discharge Orders     None         Legrand Rams 01/05/22 0932    Bethann Berkshire, MD 01/11/22 [phone]

## 2021-12-13 NOTE — Progress Notes (Signed)
MD stated that patient was having low blood sugars so I checked his blood sugar and it was 119. Talked to patient about signs and symptoms of low blood sugar and he stated that he understood.

## 2021-12-13 NOTE — Discharge Summary (Addendum)
Physician Discharge Summary   Patient: [name] [name] MRN: [id number] DOB: 18-Jan-1971  Admit date:     12/13/2021  Discharge date: 12/13/21  Discharge Physician: Clydie Braun   PCP: [name], PA-C   Recommendations at discharge: Note patient left AGAINST MEDICAL ADVICE and had not been able to be reseen by myself prior to leaving the hospital.   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 *  Hospital Course: [name] is a 51 y.o. male with medical history significant of hypertension, hyperlipidemia, diabetes mellitus type 2 with neuropathy, diabetes mellitus type 2, COPD with chronic bronchitis, depression, tobacco abuse, esophageal dysmotility, GERD, and OSA presents with complaints of dizziness.  He describes it as a pressure in his head whenever getting up and moving around.  He has to grab on to something to [name] from falling feels like he could pass out.  In addition to this he describes a numbness feeling when this occurs that travels down his neck and arms and takes 2 to 3 minutes before self resolving.  Yesterday, while he was getting up out of the rocking chair on the front porch he got lightheaded and passed out.  He woke up on the floor of the porch, and he had hit his head.  He is not sure exactly how long he was out.  Patient reports that most of his blood sugars have been elevated.  Normally he checks his blood sugars twice a day and they are usually in the 200s.  Associated symptoms include some nausea, headaches, chills at night, palpitations, and pains  in his feet and knees.  Denies having any recent chest pain, fever, diarrhea, blood in stool, or abdominal pain.  Patient reports that he has been followed by Gastroenterology and recently had esophageal manometry in April which noted some esophageal dysmotility.  He had prescribed baclofen, but he was not able to afford this medicine.   Upon admission into the emergency department patient was noted to be afebrile with pulse 96-1 19, respirations 8-30, blood pressure 88/74-153/94, and O2 saturations noted to be maintained on room air. Labs significant for WBC 15.3, BUN 20, creatinine 1.48, glucose 59, calcium 8.6 and high-sensitivity troponin negative x2. Chest x-ray did not note any acute abnormality. CT angiogram of the head and neck noted 50% stenosis of the proximal right internal carotid artery at the bifurcation, and moderate stenosis of the proximal left vertebral artery at its origin secondary to noncalcified plaque in the subclavian artery.  Patient had been given 500 mL bolus of IV fluids and amp of dextrose.  Assessment and Plan:  Syncope and collapse Acute.  Patient presents after having syncopal episode yesterday.  Patient describes symptoms only occurring while trying to get up.  EKG revealed sinus tachycardia with a bifascicular block.  Multiple recent ED visits with similar symptoms.  Evaluated on 7/4 and was noted to have elevated D-dimer of 0.74, but CT angiogram of the chest negative for any signs of PE.  Based off his description symptoms appear to be secondary to orthostatic hypotension possibly in the setting of dehydration.  He has been accepted to a telemetry bed. Check TSH (3.482).  Orthostatic vital signs, echocardiogram, and physical therapy evaluations were not able to be performed prior to the patient leaving AGAINST MEDICAL ADVICE. - Diabetes mellitus type 2 with hypoglycemia On admission glucose as low as 49.  Last hemoglobin A1c 13.2 on 09/08/2021.  Insulin regimen includes  Lantus 68 units in a.m. and 50 units nightly.  Patient has been given amp of D50 with improvement in blood glucose temporarily, but patient required a second amp of D50 as blood sugar dropped down as low as 57.  Patient was ordered to be placed on a D10 IV fluid drip and home insulin regimen held.  Prior to the patient signing out AGAINST MEDICAL ADVICE blood sugar was checked and noted to be low at 19.  Leukocytosis SIRS Acute.  On admission patient was noted to be tachycardic and tachypnea with WBC elevated 15.3 meeting SIRS criteria.  X-ray did not note any acute abnormality.  Unclear cause of symptoms at this time.  Prior to the patient leaving AGAINST MEDICAL ADVICE urine blood culture have been obtained.  Bifascicular block EKG noted RBBB and LFAB. -Follow-up echocardiogram -Follow-up telemetry overnight   Renal insufficiency Acute.  Patient creatinine 1.48 with BUN 20.  Review of records note baseline creatinine appears to be 1-1.2.  Suspect the patient is likely dehydrated from what sounds like uncontrolled diabetes and/or issues with reflux from esophageal dysmotility.  D10 IV fluids have been started prior to the patient leaving.   Hyperlipidemia -Continue atorvastatin  Anxiety -Continue home regimen  Tobacco abuse Patient has a 38 smoking pack-year history of tobacco. -Nicotine patch offered, but patient declined.   GERD esophageal dysmotility disorder  Patient was noted to have esophageal dysmotility on manometry back in April of this year with gastroenterology.  He was prescribed baclofen but was unable to afford this medicine.  Previously recommended to continue outpatient follow-up with GI and Pepcid for      Consultants: None Procedures performed: None Disposition: Home Diet recommendation:  Cardiac and Carb modified diet DISCHARGE MEDICATION: Allergies as of 12/13/2021       Reactions   Omeprazole Magnesium Swelling   Face swells, no breathing impairment    Bee Venom    Esomeprazole Swelling   Face swells, no breathing impairment   Morphine And Related    Itching at IV site, dry heaving, burning sensation in throat.        Medication List     TAKE these medications    acetaminophen 325 MG tablet Commonly known as: TYLENOL Take 650 mg by mouth every 6 (six) hours as needed for moderate pain.   albuterol 108 (90 Base) MCG/ACT inhaler Commonly known as: VENTOLIN HFA INHALE 2 PUFFS BY MOUTH EVERY 6 HOURS AS NEEDED FOR COUGHING, WHEEZING, OR SHORTNESS OF BREATH What changed: See the new instructions.   ARIPiprazole 10 MG tablet Commonly known as: ABILIFY Take 10 mg by mouth daily.   Aspirin Low Dose 81 MG tablet Generic drug: aspirin EC Take 81 mg by mouth every morning.   atorvastatin 80 MG tablet Commonly known as: LIPITOR TAKE 1 Tablet BY MOUTH ONCE EVERY DAY What changed: See the new instructions.   famotidine 40 MG tablet Commonly known as: PEPCID Take 1 tablet (40 mg total) by mouth 2 (two) times daily.   gabapentin 300 MG capsule Commonly known as: Neurontin Take 1 capsule (300 mg total) by mouth every 8 (eight) hours as needed.  isosorbide mononitrate 30 MG 24 hr tablet Commonly known as: IMDUR Take 30 mg by mouth daily.   loperamide 2 MG capsule Commonly known as: IMODIUM Take 1 capsule (2 mg total) by mouth 4 (four) times daily as needed for diarrhea or loose stools.   metFORMIN 500 MG tablet Commonly known as: GLUCOPHAGE TAKE 1 Tablet  BY MOUTH TWICE DAILY WITH A MEAL   nitroGLYCERIN 0.4 MG SL tablet Commonly known as: NITROSTAT Place 1 tablet (0.4 mg total) under the tongue every 5 (five) minutes as needed for chest pain.   NovoLOG Mix 70/30 FlexPen (70-30) 100 UNIT/ML FlexPen Generic drug: insulin aspart protamine - aspart Inject 50-60 Units into the skin 2 (two) times daily with a meal. Only with breakfast and supper when premeal glucose is above 90 What changed: additional instructions    ondansetron 4 MG tablet Commonly known as: ZOFRAN   polyethylene glycol 17 g packet Commonly known as: MIRALAX / GLYCOLAX Take 17 g by mouth daily.   sertraline 100 MG tablet Commonly known as: ZOLOFT Take 1 tablet by mouth daily.   traZODone 50 MG tablet Commonly known as: DESYREL Take 50 mg by mouth at bedtime.        Discharge Exam: Filed Weights   12/13/21 1218 12/13/21 1926  Weight: 66.2 kg 65.4 kg   Unable as patient had left AGAINST MEDICAL ADVICE  Condition at discharge: Unclear as patient left AGAINST MEDICAL ADVICE but blood sugars were checked prior to being discharged and noted to be around 116  The results of significant diagnostics from this hospitalization (including imaging, microbiology, ancillary and laboratory) are listed below for reference.   Imaging Studies: CT ANGIO HEAD NECK W WO CM  Result Date: 12/13/2021 CLINICAL DATA:  Dizziness, persistent/recurrent, cardiac or vascular cause suspected. Syncope/presyncope. Dizziness since beginning of July. EXAM: CT ANGIOGRAPHY HEAD AND NECK TECHNIQUE: Multidetector CT imaging of the head and neck was performed using the standard protocol during bolus administration of intravenous contrast. Multiplanar CT image reconstructions and MIPs were obtained to evaluate the vascular anatomy. Carotid stenosis measurements (when applicable) are obtained utilizing NASCET criteria, using the distal internal carotid diameter as the denominator. RADIATION DOSE REDUCTION: This exam was performed according to the departmental dose-optimization program which includes automated exposure control, adjustment of the mA and/or kV according to patient size and/or use of iterative reconstruction technique. CONTRAST:  58mL OMNIPAQUE IOHEXOL 350 MG/ML SOLN COMPARISON:  CT head without contrast 12/01/2021 FINDINGS: CT HEAD FINDINGS Brain: No acute infarct, hemorrhage, or mass lesion is present. No significant white matter lesions are present. The  ventricles are of normal size. No significant extraaxial fluid collection is present. The brainstem and cerebellum are within normal limits. Vascular: Faint calcifications are present within the cavernous internal carotid arteries bilaterally. No hyperdense vessel is present. Skull: Calvarium is intact. No focal lytic or blastic lesions are present. No significant extracranial soft tissue lesion is present. Sinuses: The paranasal sinuses and mastoid air cells are clear. Orbits: The globes and orbits are within normal limits. Review of the MIP images confirms the above findings CTA NECK FINDINGS Aortic arch: Atherosclerotic calcifications are present at the great vessel origins. No significant stenosis is present. Right carotid system: The right common carotid artery is within normal limits. Atherosclerotic changes are present bifurcation. Lumen is narrowed to 2.5 mm. This compares with a distal right ICA 5.0 mm. The cervical right ICA is otherwise normal. Left carotid system: The left common carotid artery is within normal limits.  Mild atherosclerotic changes are present at the bifurcation. No significant stenosis is present. The cervical left ICA is normal. Vertebral arteries: Left vertebral artery is the dominant vessel. Noncalcified plaque is present in the wall of the left subclavian artery extending into the origin with moderate stenosis of the proximal left vertebral artery right vertebral artery origin is within normal limits. No significant stenosis is present otherwise in either vertebral artery. Skeleton: Degenerative changes the cervical spine are most evident at C5-6 and C6-7. Right foraminal narrowing is present at C5-6. Left greater than right foraminal narrowing is present at C6-7. The patient is edentulous. No focal osseous lesions are present. Other neck: Soft tissues the neck are otherwise unremarkable. Salivary glands are within normal limits. Thyroid is normal. No significant adenopathy is  present. No focal mucosal or submucosal lesions are present. Upper chest: The lung apices are clear. Thoracic inlet is within normal limits. Review of the MIP images confirms the above findings CTA HEAD FINDINGS Anterior circulation: Minimal atherosclerotic calcifications are present within the cavernous internal carotid arteries bilaterally. No significant stenosis is present. ICA termini are within normal limits bilaterally. The A1 and M1 segments fill normally. The anterior communicating artery is patent. MCA bifurcations are normal. The ACA and MCA branch vessels are within normal limits. Posterior circulation: The left vertebral artery is the dominant vessel. PICA origins are visualized and normal. Vertebrobasilar junction and basilar artery is normal. Both posterior cerebral arteries originate from the basilar tip. PCA branch vessels are normal bilaterally. Venous sinuses: The dural sinuses are patent. The straight sinus deep cerebral veins are intact. Cortical veins are within normal limits. No significant vascular malformation is evident. Anatomic variants: None Review of the MIP images confirms the above findings IMPRESSION: 1. 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 2. Mild atherosclerotic changes at the left carotid bifurcation without significant stenosis. 3. 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 4. Normal variant CTA Circle of Willis without significant proximal stenosis, aneurysm, or branch vessel occlusion. 5. Degenerative changes of the cervical spine as described. 6. Aortic Atherosclerosis (ICD10-I70.0). Electronically Signed   By: Marin Roberts M.D.   On: 12/13/2021 15:51   DG Chest Port 1 View  Result Date: 12/13/2021 CLINICAL DATA:  Shortness of breath EXAM: PORTABLE CHEST 1 VIEW COMPARISON:  12/01/2021 and prior studies FINDINGS: The cardiomediastinal  silhouette is unremarkable. There is no evidence of focal airspace disease, pulmonary edema, suspicious pulmonary nodule/mass, pleural effusion, or pneumothorax. No acute bony abnormalities are identified. IMPRESSION: No active disease. Electronically Signed   By: Harmon Pier M.D.   On: 12/13/2021 15:23   CT Angio Chest PE W/Cm &/Or Wo Cm  Result Date: 12/01/2021 CLINICAL DATA:  PE suspected. Shortness of breath and chest pressure. EXAM: CT ANGIOGRAPHY CHEST WITH CONTRAST TECHNIQUE: Multidetector CT imaging of the chest was performed using the standard protocol during bolus administration of intravenous contrast. Multiplanar CT image reconstructions and MIPs were obtained to evaluate the vascular anatomy. RADIATION DOSE REDUCTION: This exam was performed according to the departmental dose-optimization program which includes automated exposure control, adjustment of the mA and/or kV according to patient size and/or use of iterative reconstruction technique. CONTRAST:  75mL OMNIPAQUE IOHEXOL 350 MG/ML SOLN COMPARISON:  CT chest 09/10/2021. FINDINGS: Cardiovascular: Satisfactory opacification of the pulmonary arteries to the segmental level. No evidence of pulmonary embolism. Normal heart size. Unchanged small pericardial effusion. Mediastinum/Nodes: No enlarged  mediastinal, hilar, or axillary lymph nodes. Thyroid gland and trachea demonstrate no significant findings. Diffuse thickening of the distal third of the esophagus. Lungs/Pleura: Minimal dependent atelectasis. The lungs are otherwise clear. No pleural effusion or pneumothorax. Upper Abdomen: No acute abnormality. Musculoskeletal: No chest wall abnormality. No acute or significant osseous findings. Old healed lower right rib fractures. Review of the MIP images confirms the above findings. IMPRESSION: 1. No pulmonary embolism. 2. Lungs are clear. 3. Diffuse thickening of the distal third of the esophagus, which can be seen in the setting of GERD.  Electronically Signed   By: Sherron Ales M.D.   On: 12/01/2021 20:35   CT Head Wo Contrast  Result Date: 12/01/2021 CLINICAL DATA:  Nonspecific dizziness. Lightheaded and vertigo for 3 days. EXAM: CT HEAD WITHOUT CONTRAST TECHNIQUE: Contiguous axial images were obtained from the base of the skull through the vertex without intravenous contrast. RADIATION DOSE REDUCTION: This exam was performed according to the departmental dose-optimization program which includes automated exposure control, adjustment of the mA and/or kV according to patient size and/or use of iterative reconstruction technique. COMPARISON:  02/18/2021 FINDINGS: Brain: No intracranial hemorrhage, mass effect, or midline shift. Stable degree of atrophy. No hydrocephalus. The basilar cisterns are patent. No evidence of territorial infarct or acute ischemia. No extra-axial or intracranial fluid collection. Vascular: No hyperdense vessel or unexpected calcification. Skull: No fracture or focal lesion. Sinuses/Orbits: Paranasal sinuses and mastoid air cells are clear. The visualized orbits are unremarkable. Other: None. IMPRESSION: 1. No acute intracranial abnormality. 2. Stable atrophy. Electronically Signed   By: Narda Rutherford M.D.   On: 12/01/2021 18:57   DG Chest 2 View  Result Date: 12/01/2021 CLINICAL DATA:  Shortness of breath and dizziness. EXAM: CHEST - 2 VIEW COMPARISON:  11/29/2021 and prior radiographs FINDINGS: The cardiomediastinal silhouette is unremarkable. There is no evidence of focal airspace disease, pulmonary edema, suspicious pulmonary nodule/mass, pleural effusion, or pneumothorax. No acute bony abnormalities are identified. L1 compression fracture again noted. IMPRESSION: No active cardiopulmonary disease. Electronically Signed   By: Harmon Pier M.D.   On: 12/01/2021 18:55   CT ABDOMEN PELVIS WO CONTRAST  Result Date: 11/29/2021 CLINICAL DATA:  Acute abdominal pain and weakness EXAM: CT ABDOMEN AND PELVIS WITHOUT  CONTRAST TECHNIQUE: Multidetector CT imaging of the abdomen and pelvis was performed following the standard protocol without IV contrast. RADIATION DOSE REDUCTION: This exam was performed according to the departmental dose-optimization program which includes automated exposure control, adjustment of the mA and/or kV according to patient size and/or use of iterative reconstruction technique. COMPARISON:  02/25/2021 FINDINGS: Lower chest: No acute abnormality. Hepatobiliary: No focal liver abnormality is seen. No gallstones, gallbladder wall thickening, or biliary dilatation. Pancreas: Unremarkable. No pancreatic ductal dilatation or surrounding inflammatory changes. Spleen: Normal in size without focal abnormality. Adrenals/Urinary Tract: Adrenal glands are within normal limits. Kidneys show no renal calculi or obstructive changes. The ureters are within normal limits. Bladder is well distended. Stomach/Bowel: No obstructive or inflammatory changes of the colon are noted. The appendix is not visualized consistent with a prior surgical history. Small bowel and stomach are unremarkable. Distal esophagus demonstrates some concentric thickening which may be related to reflux. This is new from the prior exam. Vascular/Lymphatic: Aortic atherosclerosis. No enlarged abdominal or pelvic lymph nodes. Reproductive: Prostate is unremarkable. Other: No abdominal wall hernia or abnormality. No abdominopelvic ascites. Musculoskeletal: Chronic L1 compression deformity is noted. This has progressed somewhat in the interval from the prior exam. IMPRESSION: Mild distal esophageal  thickening which may be related to reflux. No other focal acute abnormality is noted. Chronic L1 compression deformity. Electronically Signed   By: Alcide CleverMark  Lukens M.D.   On: 11/29/2021 02:16   DG Chest Port 1 View  Result Date: 11/29/2021 CLINICAL DATA:  Chest pain nausea vomiting and weakness. EXAM: PORTABLE CHEST 1 VIEW COMPARISON:  PA chest 04/21/2021  FINDINGS: The heart size and mediastinal contours are within normal limits. Both lungs are clear. The visualized skeletal structures are unremarkable. IMPRESSION: No active disease.  Stable chest. Electronically Signed   By: Almira BarKeith  Chesser M.D.   On: 11/29/2021 00:22    Microbiology: Results for orders placed or performed during the hospital encounter of 12/13/21  Culture, blood (single) w Reflex to ID Panel     Status: None (Preliminary result)   Collection Time: 12/13/21  6:42 PM   Specimen: BLOOD LEFT FOREARM  Result Value Ref Range Status   Specimen Description   Final    BLOOD LEFT FOREARM BOTTLES DRAWN AEROBIC AND ANAEROBIC   Special Requests   Final    Blood Culture adequate volume Performed at [facility], [address] [place], [place] [address]    Culture PENDING  Incomplete   Report Status PENDING  Incomplete    Labs: CBC: Recent Labs  Lab 12/13/21 1244  WBC 15.3*  HGB 15.8  HCT 46.1  MCV 90.9  PLT 364   Basic Metabolic Panel: Recent Labs  Lab 12/13/21 1244 12/13/21 1730  NA 137  --   K 3.8  --   CL 105  --   CO2 23  --   GLUCOSE 59*  --   BUN 20  --   CREATININE 1.48*  --   CALCIUM 8.6*  --   MG  --  1.9   Liver Function Tests: No results for input(s): "AST", "ALT", "ALKPHOS", "BILITOT", "PROT", "ALBUMIN" in the last 168 hours. CBG: Recent Labs  Lab 12/13/21 1444 12/13/21 1728 12/13/21 1756 12/13/21 1831 12/13/21 2027  GLUCAP 122* 61* 57* 119* 119*    Signed: Clydie Braunondell A [address]t, MD [facility] 12/13/2021

## 2021-12-13 NOTE — ED Notes (Signed)
A&O x 4. Grahm crackers and milk given as a snack. No signs of distress. VSS.

## 2021-12-13 NOTE — ED Notes (Signed)
Eating crackers and drinking juice. No signs of distress.

## 2021-12-13 NOTE — H&P (Addendum)
History and Physical    Patient: [name] [name] SAY:[id number] DOB: 18-Jun-1970 DOA: 12/13/2021 DOS: the patient was seen and examined on 12/13/2021 PCP: [name], PA-C  Patient coming from: Home  Chief Complaint:  Chief Complaint  Patient presents with   Loss of Consciousness   Dizziness   HPI: [name] [name] is a 51 y.o. male with medical history significant of hypertension, hyperlipidemia, diabetes mellitus type 2 with neuropathy, diabetes mellitus type 2, COPD with chronic bronchitis, depression, tobacco abuse, esophageal dysmotility, GERD, and OSA presents with complaints of dizziness.  He describes it as a pressure in his head whenever getting up and moving around.  He has to grab on to something to keep from falling feels like he could pass out.  In addition to this he describes a numbness feeling when this occurs that travels down his neck and arms and takes 2 to 3 minutes before self resolving.  Yesterday, while he was getting up out of the rocking chair on the front porch he got lightheaded and passed out.  He woke up on the floor of the porch, and he had hit his head.  He is not sure exactly how long he was out.  Patient reports that most of his blood sugars have been elevated.  Normally he checks his blood sugars twice a day and they are usually in the 200s.  Associated symptoms include some nausea, headaches, chills at night, palpitations, and pains in his feet and knees.  Denies having any recent chest pain, fever, diarrhea, blood in stool, or abdominal pain.  Patient reports that he has been followed by Gastroenterology and recently had esophageal manometry in April which noted some esophageal dysmotility.  He had prescribed baclofen, but he was not able to afford this medicine.  Upon admission into the emergency department patient was noted to be afebrile with pulse 96-1 19, respirations 8-30, blood pressure 88/74-153/94, and O2 saturations noted to be maintained on room air.  Labs significant for WBC 15.3, BUN 20, creatinine 1.48, glucose 59, calcium 8.6 and high-sensitivity troponin negative x2. Chest x-ray did not note any acute abnormality. CT angiogram of the head and neck noted 50% stenosis of the proximal right internal carotid artery at the bifurcation, and moderate stenosis of the proximal left vertebral artery at its origin secondary to noncalcified plaque in the subclavian artery.  Patient had been given 500 mL bolus of IV fluids and amp of dextrose.  Review of Systems: As mentioned in the history of present illness. All other systems reviewed and are negative. Past Medical History:  Diagnosis Date   Anxiety    Arthritis    Asthma    CAD (coronary artery disease)    Moderate LAD disease 2016 - Dr. 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   Type 2 diabetes mellitus (HCC)    Past Surgical History:  Procedure Laterality Date   ANKLE SURGERY Right 1982   "had extra bones in there; took them out"   APPENDECTOMY  1975   BIOPSY  12/26/2018   Procedure:  BIOPSY;  Surgeon: Malissa Hippo, MD;  [facility];  Service: Endoscopy;;  duodenal biopsies   BIOPSY  03/23/2019   Procedure: BIOPSY;  Surgeon: Malissa Hippo, MD;  Location: [facility];  Service: Endoscopy;;  esophagus   CARDIAC CATHETERIZATION N/A 03/28/2015   Procedure: Left Heart Cath and Coronary Angiography;  Surgeon: Yates Decamp, MD;  Location: [facility] INVASIVE CV LAB;  Service: Cardiovascular;  Laterality: N/A;   CARDIAC CATHETERIZATION N/A 03/28/2015   Procedure: Intravascular Pressure Wire/FFR Study;  Surgeon: Yates Decamp, MD;  Location: [facility] INVASIVE CV  LAB;  Service: Cardiovascular;  Laterality: N/A;   CARPAL TUNNEL RELEASE Left ~ 2008   COLONOSCOPY WITH ESOPHAGOGASTRODUODENOSCOPY (EGD)     COLONOSCOPY WITH PROPOFOL N/A 12/24/2019   Procedure: COLONOSCOPY WITH PROPOFOL;  Surgeon: Malissa Hippo, MD;  [facility]: [facility];  Service: Endoscopy;  Laterality: N/A;  955   ELBOW FRACTURE SURGERY Left ~ 2008   ESOPHAGEAL MANOMETRY N/A 09/16/2021   Procedure: ESOPHAGEAL MANOMETRY (EM);  Surgeon: Shellia Cleverly, DO;  Location: [facility];  Service: Endoscopy;  Laterality: N/A;   ESOPHAGOGASTRODUODENOSCOPY N/A 12/26/2018   Procedure: ESOPHAGOGASTRODUODENOSCOPY (EGD);  Surgeon: Malissa Hippo, MD;  [facility];  Service: Endoscopy;  Laterality: N/A;   ESOPHAGOGASTRODUODENOSCOPY (EGD) WITH PROPOFOL N/A 03/23/2019   Procedure: ESOPHAGOGASTRODUODENOSCOPY (EGD) WITH PROPOFOL;  Surgeon: Malissa Hippo, MD;  [facility];  Service: Endoscopy;  Laterality: N/A;  7:30   FRACTURE SURGERY     ankle and elbow   PH IMPEDANCE STUDY N/A 09/16/2021   Procedure: PH IMPEDANCE STUDY;  Surgeon: Shellia Cleverly, DO;  Location: [facility];  Service: Endoscopy;  Laterality: N/A;   PILONIDAL CYST EXCISION N/A 03/24/2017   Procedure: EXCISION CHRONIC  PILONIDAL ABSCESS;  Surgeon: [name] Miyamoto, MD;  Location: [facility] ORS;  Service: General;  Laterality: N/A;   SCROTAL EXPLORATION N/A 11/13/2020   Procedure: EXCISION OF SEBACEOUS CYSTS, SCROTUM;  Surgeon: Malen Gauze, MD;  Location: [facility];  Service: Urology;  Laterality: N/A;   TENDON REPAIR Left ~ 2004   "main tendon in my ankle"   Social History:  reports that he has been smoking cigarettes. He has a 17.00 pack-year smoking history. He has never used smokeless tobacco. He reports that he does not drink alcohol and does not use drugs.  Allergies  Allergen Reactions   Omeprazole Magnesium Swelling    Face swells, no breathing impairment   Bee Venom    Esomeprazole Swelling     Face swells, no breathing impairment   Morphine And Related     Itching at IV site, dry heaving, burning sensation in throat.    Family History  Problem Relation Age of Onset   Diabetes Mother    Hypertension Mother    Cancer Mother    Throat cancer Mother    Diabetes Father    Hypertension Father    Hyperlipidemia Father    Congestive Heart Failure Sister    Colon cancer Neg Hx    Pancreatic cancer Neg Hx    Liver disease Neg Hx     Prior to Admission medications   Medication Sig Start Date End Date Taking? Authorizing Provider  acetaminophen (TYLENOL) 325 MG tablet Take 650 mg by mouth every 6 (six) hours as needed for moderate pain.   Yes [provider]  albuterol (VENTOLIN HFA) 108 (90 Base) MCG/ACT inhaler INHALE 2 PUFFS BY MOUTH EVERY 6 HOURS AS NEEDED FOR COUGHING, WHEEZING, OR  SHORTNESS OF BREATH Patient taking differently: Inhale 2 puffs into the lungs every 6 (six) hours as needed for wheezing or shortness of breath. INHALE 2 PUFFS BY MOUTH EVERY 6 HOURS AS NEEDED FOR COUGHING, WHEEZING, OR SHORTNESS OF BREATH 09/09/21  Yes [name], PA-C  ARIPiprazole (ABILIFY) 10 MG tablet Take 10 mg by mouth daily.   Yes [provider]  ASPIRIN LOW DOSE 81 MG EC tablet Take 81 mg by mouth every morning. 09/05/20  Yes [provider]  atorvastatin (LIPITOR) 80 MG tablet TAKE 1 Tablet BY MOUTH ONCE EVERY DAY Patient taking differently: Take 80 mg by mouth daily. 09/09/21  Yes [name], PA-C  famotidine (PEPCID) 40 MG tablet Take 1 tablet (40 mg total) by mouth 2 (two) times daily. 05/26/21  Yes [name], PA-C  gabapentin (NEURONTIN) 300 MG capsule Take 1 capsule (300 mg total) by mouth every 8 (eight) hours as needed. 10/22/21 12/13/21 Yes Trifan, Kermit Balo, MD  insulin aspart protamine - aspart (NOVOLOG MIX 70/30 FLEXPEN) (70-30) 100 UNIT/ML FlexPen Inject 50-60 Units into the skin 2 (two) times daily with a meal. Only with breakfast and  supper when premeal glucose is above 90 Patient taking differently: Inject 50-60 Units into the skin 2 (two) times daily with a meal. Only with breakfast and supper when premeal glucose is above 90 60 units in the morning and 50 every evening 09/08/21  Yes Nida, Denman George, MD  isosorbide mononitrate (IMDUR) 30 MG 24 hr tablet Take 30 mg by mouth daily. 11/30/21  Yes [provider]  loperamide (IMODIUM) 2 MG capsule Take 1 capsule (2 mg total) by mouth 4 (four) times daily as needed for diarrhea or loose stools. 11/29/21  Yes Sabas Sous, MD  metFORMIN (GLUCOPHAGE) 500 MG tablet TAKE 1 Tablet  BY MOUTH TWICE DAILY WITH A MEAL 10/28/21  Yes Nida, Denman George, MD  nitroGLYCERIN (NITROSTAT) 0.4 MG SL tablet Place 1 tablet (0.4 mg total) under the tongue every 5 (five) minutes as needed for chest pain. 10/05/17  Yes [name], PA-C  ondansetron (ZOFRAN) 4 MG tablet    Yes [provider]  polyethylene glycol (MIRALAX / GLYCOLAX) 17 g packet Take 17 g by mouth daily. 11/11/20  Yes Pollina, Canary Brim, MD  sertraline (ZOLOFT) 100 MG tablet Take 1 tablet by mouth daily. 01/09/20  Yes [provider]  traZODone (DESYREL) 50 MG tablet Take 50 mg by mouth at bedtime. 10/03/20  Yes [provider]    Physical Exam: Vitals:   12/13/21 1500 12/13/21 1515 12/13/21 1530 12/13/21 1700  BP: (!) 88/74  (!) 153/94 112/75  Pulse: 100 98 98 98  Resp: (!) 21 20 17 20   Temp:    98 F (36.7 C)  TempSrc:    Oral  SpO2: 99% 98% 99% 95%  Weight:      Height:       Constitutional: Middle-age male appears to be in no acute distress Eyes: PERRL, lids and conjunctivae normal ENMT: Mucous membranes are dry.    Neck: normal, supple, no masses,  Respiratory: clear to auscultation bilaterally, no wheezing, no crackles. Normal respiratory effort. No accessory muscle use.  Cardiovascular: Regular rate and rhythm, no murmurs / rubs / gallops. No extremity edema. 2+ pedal  pulses.   Abdomen: no tenderness, no masses palpated.  Bowel sounds positive.  Musculoskeletal: no clubbing / cyanosis. No joint deformity upper and lower extremities. Good ROM, no contractures. Normal muscle tone.  Skin: no  rashes, lesions, ulcers. No induration Neurologic: CN 2-12 grossly intact. Sensation intact, DTR normal. Strength 5/5 in all 4.  Psychiatric: Normal judgment and insight. Alert and oriented x 3. Normal mood.   Data Reviewed:  EKG revealed sinus tachycardia 121 bpm with RBBB and LAFB Assessment and Plan:  Syncope and collapse Acute.  Patient presents after having syncopal episode yesterday.  Patient describes symptoms only occurring while trying to get up.  EKG revealed sinus tachycardia with a bifascicular block.  Multiple recent ED visits with similar symptoms.  Evaluated on 7/4 and was noted to have elevated D-dimer of 0.74, but CT angiogram of the chest negative for any signs of PE.  Based off his description symptoms appear to be secondary to orthostatic hypotension possibly in the setting of dehydration. -Admit to a cardiac telemetry bed -Check orthostatic vital signs -Check TSH -Check echocardiogram -PT to evaluate and treat  Diabetes mellitus type 2 with hypoglycemia On admission glucose as low as 49.  Last hemoglobin A1c 13.2 on 09/08/2021.  Insulin regimen includes Lantus 68 units in a.m. and 50 units nightly. -Hypoglycemic protocols -Check hemoglobin A1c -Hold home insulin regimen -CBGs every 4 hours  -D10 IV fluids at 75 mL/h -Amp of D50 as needed for low blood sugars -Plan is to transition to adjusted regimen with blood sugars maintain greater than 180 for 2 separate checks 4 hours apart  Leukocytosis SIRS Acute.  On admission patient was noted to be tachycardic and tachypnea with WBC elevated 15.3 meeting SIRS criteria.  X-ray did not note any acute abnormality.  Unclear cause of symptoms at this time. -Add on lactic acid -Check urinalysis -Check  blood culture -Recheck CBC in a.m.  Bifascicular block EKG noted RBBB and LFAB. -Follow-up echocardiogram -Follow-up telemetry overnight  Renal insufficiency Acute.  Patient creatinine 1.48 with BUN 20.  Review of records note baseline creatinine appears to be 1-1.2.  Suspect the patient is likely dehydrated from what sounds like uncontrolled diabetes and/or issues with reflux from esophageal dysmotility. -IV fluids -Continue to monitor kidney function  Tobacco abuse Patient has a 38 smoking pack-year history of tobacco. -Nicotine patch offered, but patient declined.  Hyperlipidemia -Continue atorvastatin  Anxiety -Continue home regimen   GERD esophageal dysmotility disorder  Patient was noted to have esophageal dysmotility on manometry back in April of this year with gastroenterology.  He was prescribed baclofen but was unable to afford this medicine. -Continue Pepcid -Recommend continued outpatient follow-up with GI   Advance Care Planning:   Code Status: Full Code   Consults: None  Family Communication: Unable to update patient's brother over the phone  Severity of Illness: The appropriate patient status for this patient is OBSERVATION. Observation status is judged to be reasonable and necessary in order to provide the required intensity of service to ensure the patient's safety. The patient's presenting symptoms, physical exam findings, and initial radiographic and laboratory data in the context of their medical condition is felt to place them at decreased risk for further clinical deterioration. Furthermore, it is anticipated that the patient will be medically stable for discharge from the hospital within 2 midnights of admission.   Author: Clydie Braun, MD 12/13/2021 5:15 PM  For on call review [email or web address].

## 2021-12-13 NOTE — Progress Notes (Signed)
Went into patient's room to do admission and patient stated he wanted to leave. He stated his father was at [facility] and he wanted to be there with him. Notified MD.

## 2021-12-14 ENCOUNTER — Ambulatory Visit: Payer: Self-pay | Admitting: Physician Assistant

## 2021-12-14 LAB — HIV ANTIBODY (ROUTINE TESTING W REFLEX): HIV Screen 4th Generation wRfx: NONREACTIVE

## 2021-12-14 LAB — HEMOGLOBIN A1C
Hgb A1c MFr Bld: 13.7 % — ABNORMAL HIGH (ref 4.8–5.6)
Mean Plasma Glucose: 346.49 mg/dL

## 2021-12-15 ENCOUNTER — Ambulatory Visit: Payer: Self-pay | Admitting: "Endocrinology

## 2021-12-18 LAB — CULTURE, BLOOD (SINGLE)
Culture: NO GROWTH
Special Requests: ADEQUATE

## 2021-12-26 ENCOUNTER — Emergency Department (HOSPITAL_COMMUNITY)
Admission: EM | Admit: 2021-12-26 | Discharge: 2021-12-26 | Disposition: A | Discharge status: Home / Self Care | Payer: Medicaid Other | Attending: Emergency Medicine | Admitting: Emergency Medicine

## 2021-12-26 ENCOUNTER — Other Ambulatory Visit: Payer: Self-pay

## 2021-12-26 ENCOUNTER — Encounter (HOSPITAL_COMMUNITY): Payer: Self-pay

## 2021-12-26 DIAGNOSIS — R739 Hyperglycemia, unspecified: Secondary | ICD-10-CM

## 2021-12-26 DIAGNOSIS — R42 Dizziness and giddiness: Secondary | ICD-10-CM | POA: Insufficient documentation

## 2021-12-26 DIAGNOSIS — E1165 Type 2 diabetes mellitus with hyperglycemia: Secondary | ICD-10-CM | POA: Insufficient documentation

## 2021-12-26 DIAGNOSIS — Z7984 Long term (current) use of oral hypoglycemic drugs: Secondary | ICD-10-CM | POA: Insufficient documentation

## 2021-12-26 DIAGNOSIS — R000 Tachycardia, unspecified: Secondary | ICD-10-CM | POA: Insufficient documentation

## 2021-12-26 DIAGNOSIS — Z794 Long term (current) use of insulin: Secondary | ICD-10-CM | POA: Insufficient documentation

## 2021-12-26 DIAGNOSIS — R110 Nausea: Secondary | ICD-10-CM | POA: Insufficient documentation

## 2021-12-26 DIAGNOSIS — Z79899 Other long term (current) drug therapy: Secondary | ICD-10-CM | POA: Insufficient documentation

## 2021-12-26 DIAGNOSIS — R519 Headache, unspecified: Secondary | ICD-10-CM

## 2021-12-26 DIAGNOSIS — Z7982 Long term (current) use of aspirin: Secondary | ICD-10-CM | POA: Insufficient documentation

## 2021-12-26 LAB — CBG MONITORING, ED: Glucose-Capillary: 320 mg/dL — ABNORMAL HIGH (ref 70–99)

## 2021-12-26 LAB — CBC
HCT: 43.9 % (ref 39.0–52.0)
Hemoglobin: 15 g/dL (ref 13.0–17.0)
MCH: 31.6 pg (ref 26.0–34.0)
MCHC: 34.2 g/dL (ref 30.0–36.0)
MCV: 92.6 fL (ref 80.0–100.0)
Platelets: 245 10*3/uL (ref 150–400)
RBC: 4.74 MIL/uL (ref 4.22–5.81)
RDW: 13.1 % (ref 11.5–15.5)
WBC: 17.9 10*3/uL — ABNORMAL HIGH (ref 4.0–10.5)
nRBC: 0 % (ref 0.0–0.2)

## 2021-12-26 LAB — URINALYSIS, ROUTINE W REFLEX MICROSCOPIC
Bacteria, UA: NONE SEEN
Bilirubin Urine: NEGATIVE
Glucose, UA: >=500 mg/dL — AB
Hgb urine dipstick: NEGATIVE
Ketones, ur: NEGATIVE mg/dL
Leukocytes,Ua: NEGATIVE
Nitrite: NEGATIVE
Protein, ur: 100 mg/dL — AB
Specific Gravity, Urine: 1.022 (ref 1.005–1.030)
pH: 6 (ref 5.0–8.0)

## 2021-12-26 LAB — BASIC METABOLIC PANEL
Anion gap: 7 (ref 5–15)
BUN: 17 mg/dL (ref 6–20)
CO2: 22 mmol/L (ref 22–32)
Calcium: 8.5 mg/dL — ABNORMAL LOW (ref 8.9–10.3)
Chloride: 104 mmol/L (ref 98–111)
Creatinine, Ser: 1.1 mg/dL (ref 0.61–1.24)
GFR, Estimated: >60 mL/min (ref >60)
Glucose, Bld: 344 mg/dL — ABNORMAL HIGH (ref 70–99)
Potassium: 4.9 mmol/L (ref 3.5–5.1)
Sodium: 133 mmol/L — ABNORMAL LOW (ref 135–145)

## 2021-12-26 MED ORDER — KETOROLAC TROMETHAMINE 60 MG/2ML IM SOLN
60.0000 mg | Freq: Once | INTRAMUSCULAR | Status: AC
Start: 2021-12-26 — End: 2021-12-26
  Administered 2021-12-26: 60 mg via INTRAMUSCULAR
  Filled 2021-12-26: qty 2

## 2021-12-26 NOTE — ED Notes (Signed)
Pt d/c home per MD order, Discharge summary reviewed, pt verbalizes understanding. Ambulatory off unit. No s/s of acute distress noted at discharge. 

## 2021-12-26 NOTE — ED Provider Notes (Signed)
[facility] EMERGENCY DEPARTMENT Provider Note   CSN: [id number] Arrival date & time: 12/26/21  1103     History  Chief Complaint  Patient presents with   Hyperglycemia   Dizziness    PRICE LACHAPELLE is a 51 y.o. male.   Hyperglycemia Associated symptoms: dizziness   Dizziness   This patient is a 51 year old male, he is a diabetic, he is well-known to the emergency department for multiple visits and in fact he has been seen now 5 times this month for a variety of complaints.  The patient states that this morning he noticed that his blood sugar was close to 500, he was feeling a little bit dizzy with a headache a little bit nauseated and has been passing lots of gas from his bottom.  He reports that he took his morning insulin and though he feels a little bit better he still feels gassy.  The patient does not have any fevers or chills, he is not vomiting, he has no diarrhea, he has no swelling or rashes, he has no chest pain coughing or shortness of breath.  Home Medications Prior to Admission medications   Medication Sig Start Date End Date Taking? Authorizing Provider  acetaminophen (TYLENOL) 325 MG tablet Take 650 mg by mouth every 6 (six) hours as needed for moderate pain.    [provider]  albuterol (VENTOLIN HFA) 108 (90 Base) MCG/ACT inhaler INHALE 2 PUFFS BY MOUTH EVERY 6 HOURS AS NEEDED FOR COUGHING, WHEEZING, OR SHORTNESS OF BREATH Patient taking differently: Inhale 2 puffs into the lungs every 6 (six) hours as needed for wheezing or shortness of breath. INHALE 2 PUFFS BY MOUTH EVERY 6 HOURS AS NEEDED FOR COUGHING, WHEEZING, OR SHORTNESS OF BREATH 09/09/21   [name], PA-C  ARIPiprazole (ABILIFY) 10 MG tablet Take 10 mg by mouth daily.    [provider]  ASPIRIN LOW DOSE 81 MG EC tablet Take 81 mg by mouth every morning. 09/05/20   [provider]  atorvastatin (LIPITOR) 80 MG tablet TAKE 1 Tablet BY MOUTH ONCE EVERY DAY Patient taking  differently: Take 80 mg by mouth daily. 09/09/21   [name], PA-C  famotidine (PEPCID) 40 MG tablet Take 1 tablet (40 mg total) by mouth 2 (two) times daily. 05/26/21   [name], PA-C  gabapentin (NEURONTIN) 300 MG capsule Take 1 capsule (300 mg total) by mouth every 8 (eight) hours as needed. 10/22/21 12/13/21  Terald Sleeper, MD  insulin aspart protamine - aspart (NOVOLOG MIX 70/30 FLEXPEN) (70-30) 100 UNIT/ML FlexPen Inject 50-60 Units into the skin 2 (two) times daily with a meal. Only with breakfast and supper when premeal glucose is above 90 Patient taking differently: Inject 50-60 Units into the skin 2 (two) times daily with a meal. Only with breakfast and supper when premeal glucose is above 90 60 units in the morning and 50 every evening 09/08/21   Roma Kayser, MD  isosorbide mononitrate (IMDUR) 30 MG 24 hr tablet Take 30 mg by mouth daily. 11/30/21   [provider]  loperamide (IMODIUM) 2 MG capsule Take 1 capsule (2 mg total) by mouth 4 (four) times daily as needed for diarrhea or loose stools. 11/29/21   Sabas Sous, MD  metFORMIN (GLUCOPHAGE) 500 MG tablet TAKE 1 Tablet  BY MOUTH TWICE DAILY WITH A MEAL 10/28/21   Nida, Denman George, MD  nitroGLYCERIN (NITROSTAT) 0.4 MG SL tablet Place 1 tablet (0.4 mg total) under the tongue every 5 (  five) minutes as needed for chest pain. 10/05/17   [name], PA-C  ondansetron (ZOFRAN) 4 MG tablet     [provider]  polyethylene glycol (MIRALAX / GLYCOLAX) 17 g packet Take 17 g by mouth daily. 11/11/20   Gilda Crease, MD  sertraline (ZOLOFT) 100 MG tablet Take 1 tablet by mouth daily. 01/09/20   [provider]  traZODone (DESYREL) 50 MG tablet Take 50 mg by mouth at bedtime. 10/03/20   [provider]      Allergies    Omeprazole magnesium, Bee venom, Esomeprazole, and Morphine and related    Review of Systems   Review of Systems  Neurological:  Positive for dizziness.   All other systems reviewed and are negative.   Physical Exam Updated Vital Signs BP 105/70   Pulse (!) 101   Temp 98.9 F (37.2 C) (Oral)   Resp 18   Ht 1.727 m (5\' 8" )   Wt 65.3 kg   SpO2 100%   BMI 21.90 kg/m  Physical Exam Vitals and nursing note reviewed.  Constitutional:      General: He is not in acute distress.    Appearance: He is well-developed.  HENT:     Head: Normocephalic and atraumatic.     Mouth/Throat:     Pharynx: No oropharyngeal exudate.  Eyes:     General: No scleral icterus.       Right eye: No discharge.        Left eye: No discharge.     Conjunctiva/sclera: Conjunctivae normal.     Pupils: Pupils are equal, round, and reactive to light.  Neck:     Thyroid: No thyromegaly.     Vascular: No JVD.  Cardiovascular:     Rate and Rhythm: Regular rhythm. Tachycardia present.     Heart sounds: Normal heart sounds. No murmur heard.    No friction rub. No gallop.     Comments: Heart rate of 105 on my exam Pulmonary:     Effort: Pulmonary effort is normal. No respiratory distress.     Breath sounds: Normal breath sounds. No wheezing or rales.  Abdominal:     General: Bowel sounds are normal. There is no distension.     Palpations: Abdomen is soft. There is no mass.     Tenderness: There is no abdominal tenderness.  Musculoskeletal:        General: No tenderness. Normal range of motion.     Cervical back: Normal range of motion and neck supple.     Right lower leg: No edema.     Left lower leg: No edema.  Lymphadenopathy:     Cervical: No cervical adenopathy.  Skin:    General: Skin is warm and dry.     Findings: No erythema or rash.  Neurological:     Mental Status: He is alert.     Coordination: Coordination normal.  Psychiatric:        Behavior: Behavior normal.     ED Results / Procedures / Treatments   Labs (all labs ordered are listed, but only abnormal results are displayed) Labs Reviewed  BASIC METABOLIC PANEL - Abnormal; Notable  for the following components:      Result Value   Sodium 133 (*)    Glucose, Bld 344 (*)    Calcium 8.5 (*)    All other components within normal limits  CBC - Abnormal; Notable for the following components:   WBC 17.9 (*)    All  other components within normal limits  URINALYSIS, ROUTINE W REFLEX MICROSCOPIC - Abnormal; Notable for the following components:   Color, Urine STRAW (*)    Glucose, UA >=500 (*)    Protein, ur 100 (*)    All other components within normal limits  CBG MONITORING, ED - Abnormal; Notable for the following components:   Glucose-Capillary 320 (*)    All other components within normal limits    EKG None  Radiology No results found.  Procedures Procedures    Medications Ordered in ED Medications  ketorolac (TORADOL) injection 60 mg (has no administration in time range)    ED Course/ Medical Decision Making/ A&P                           Medical Decision Making Amount and/or Complexity of Data Reviewed Labs: ordered.  Risk Prescription drug management.   This patient presents to the ED for concern of headache, fatigue, nausea, hyperglycemia differential diagnosis includes medication noncompliance, dietary noncompliance, underlying infection.  The patient is actually in no distress and has an unremarkable exam, I suspect that his symptoms are related to becoming somewhat of a brittle diabetic as well as some dietary nondiscretions.  He has been evaluated multiple times over the last month and in fact was admitted to the hospital after a syncopal event.  He was admitted on the 16th and discharged on the same day.  He left AGAINST MEDICAL ADVICE, he had had some transient hypoglycemia.  He states that he missed his most recent visit to the diabetic specialist because he was in the hospital with his dad.  He does see endocrinology here in [place] with Dr. [name]    Additional history obtained:  Additional history obtained from medical  record External records from outside source obtained and reviewed including recent admission and leaving AMA   Lab Tests:  I Ordered, and personally interpreted labs.  The pertinent results include: CBC metabolic panel urinalysis, there is some hyperglycemia but no signs of DKA, no protein or ketones in the urine, CBC is rather unremarkable  No imaging was indicated  Medicines ordered and prescription drug management:  I ordered medication including ketorolac for headache Reevaluation of the patient after these medicines showed that the patient improved I have reviewed the patients home medicines and have made adjustments as needed   Problem List / ED Course:  Hyperglycemia, gradually improving, took his medicine this morning, no signs of DKA, no need for IV insulin or fluid resuscitation   Social Determinants of Health:  Chronic tobacco user, ongoing diabetic, seems to have poor control, needs referral back to his endocrinologist but stable for discharge           Final Clinical Impression(s) / ED Diagnoses Final diagnoses:  Hyperglycemia  Nonintractable headache, unspecified chronicity pattern, unspecified headache type    Rx / DC Orders ED Discharge Orders     None         Eber Hong, MD 12/26/21 1208

## 2021-12-26 NOTE — ED Triage Notes (Signed)
Pt reports BG 490 and he took 60units of insulin from his flexpen. BG in triage 320. Pt repors lightheaded and dizziness. Pt states "my mouth is watering and I can't stop it."

## 2021-12-26 NOTE — Discharge Instructions (Signed)
Your testing today has been reassuring, there is no signs of infection, you most certainly need to obtain a repeat exam with your endocrinologist Dr. [name], please call in the morning to make an appointment for this week.  I would like for you to return to the emergency department for severe or worsening symptoms but in the meantime drink plenty of clear liquids and make sure you are taking your insulin as prescribed following a close diabetic diet low in carbohydrates

## 2021-12-26 NOTE — ED Notes (Signed)
Specimen cup provided, pt encouraged to provide urine specimen per MD order. Urinal at bedside

## 2021-12-31 ENCOUNTER — Encounter: Payer: Self-pay | Admitting: Physician Assistant

## 2021-12-31 ENCOUNTER — Ambulatory Visit: Payer: Self-pay | Admitting: Physician Assistant

## 2021-12-31 VITALS — BP 93/63 | HR 123 | Temp 97.1°F | Wt 146.3 lb

## 2021-12-31 DIAGNOSIS — R000 Tachycardia, unspecified: Secondary | ICD-10-CM

## 2021-12-31 DIAGNOSIS — R197 Diarrhea, unspecified: Secondary | ICD-10-CM

## 2021-12-31 DIAGNOSIS — F17200 Nicotine dependence, unspecified, uncomplicated: Secondary | ICD-10-CM

## 2021-12-31 DIAGNOSIS — E1165 Type 2 diabetes mellitus with hyperglycemia: Secondary | ICD-10-CM

## 2021-12-31 DIAGNOSIS — F32A Depression, unspecified: Secondary | ICD-10-CM

## 2021-12-31 DIAGNOSIS — K5900 Constipation, unspecified: Secondary | ICD-10-CM

## 2021-12-31 LAB — GLUCOSE, POCT (MANUAL RESULT ENTRY): POC Glucose: 316 mg/dl — AB (ref 70–99)

## 2021-12-31 MED ORDER — ATORVASTATIN CALCIUM 80 MG PO TABS: 80.0000 mg | ORAL_TABLET | Freq: Every day | ORAL | 0 refills | Status: DC

## 2021-12-31 NOTE — Progress Notes (Signed)
BP 93/63   Pulse (!) 123   Temp (!) 97.1 F (36.2 C)   Wt 146 lb 5 oz (66.4 kg)   SpO2 99%   BMI 22.25 kg/m    Subjective:    Patient ID: [name] [name], male    DOB: Nov 19, 1970, 51 y.o.   MRN: [id number]  HPI: [name] [name] is a 51 y.o. male presenting on 12/31/2021 for No chief complaint on file.   HPI   Pt is in today for routine follow up.    He has had 5 visits to ER in July- was admitted once but left AMA.   He was Last seen by endocrinology in April- he cancelled his July appt   He is still going to [facility] for [facility] issues.  He has been having stress with his father who has been sick and his brother who "is a pain in the butt".  He helps take care of his father who recently got a pacemaker.  His father can cook for himself.     Pt reports this morning fbs 416.         Relevant past medical, surgical, family and social history reviewed and updated as indicated. Interim medical history since our last visit reviewed. Allergies and medications reviewed and updated.    Current Outpatient Medications:    acetaminophen (TYLENOL) 325 MG tablet, Take 650 mg by mouth every 6 (six) hours as needed for moderate pain., Disp: , Rfl:    albuterol (VENTOLIN HFA) 108 (90 Base) MCG/ACT inhaler, INHALE 2 PUFFS BY MOUTH EVERY 6 HOURS AS NEEDED FOR COUGHING, WHEEZING, OR SHORTNESS OF BREATH (Patient taking differently: Inhale 2 puffs into the lungs every 6 (six) hours as needed for wheezing or shortness of breath. INHALE 2 PUFFS BY MOUTH EVERY 6 HOURS AS NEEDED FOR COUGHING, WHEEZING, OR SHORTNESS OF BREATH), Disp: 20.1 g, Rfl: 0   ARIPiprazole (ABILIFY) 10 MG tablet, Take 10 mg by mouth daily., Disp: , Rfl:    ASPIRIN LOW DOSE 81 MG EC tablet, Take 81 mg by mouth every morning., Disp: , Rfl:    atorvastatin (LIPITOR) 80 MG tablet, TAKE 1 Tablet BY MOUTH ONCE EVERY DAY (Patient taking differently: Take 80 mg by mouth daily.), Disp: 90 tablet, Rfl: 0   famotidine (PEPCID) 40 MG tablet,  Take 1 tablet (40 mg total) by mouth 2 (two) times daily., Disp: 60 tablet, Rfl: 3   gabapentin (NEURONTIN) 300 MG capsule, Take 1 capsule (300 mg total) by mouth every 8 (eight) hours as needed., Disp: 30 capsule, Rfl: 0   insulin aspart protamine - aspart (NOVOLOG MIX 70/30 FLEXPEN) (70-30) 100 UNIT/ML FlexPen, Inject 50-60 Units into the skin 2 (two) times daily with a meal. Only with breakfast and supper when premeal glucose is above 90 (Patient taking differently: Inject 50-60 Units into the skin 2 (two) times daily with a meal. Only with breakfast and supper when premeal glucose is above 90 60 units in the morning and 50 every evening), Disp: 30 mL, Rfl: 2   isosorbide mononitrate (IMDUR) 30 MG 24 hr tablet, Take 30 mg by mouth daily., Disp: , Rfl:    loperamide (IMODIUM) 2 MG capsule, Take 1 capsule (2 mg total) by mouth 4 (four) times daily as needed for diarrhea or loose stools., Disp: 12 capsule, Rfl: 0   metFORMIN (GLUCOPHAGE) 500 MG tablet, TAKE 1 Tablet  BY MOUTH TWICE DAILY WITH A MEAL, Disp: 180 tablet, Rfl: 0   nitroGLYCERIN (NITROSTAT) 0.4  MG SL tablet, Place 1 tablet (0.4 mg total) under the tongue every 5 (five) minutes as needed for chest pain., Disp: 25 tablet, Rfl: 3   ondansetron (ZOFRAN) 4 MG tablet, , Disp: , Rfl:    polyethylene glycol (MIRALAX / GLYCOLAX) 17 g packet, Take 17 g by mouth daily., Disp: 14 each, Rfl: 0   sertraline (ZOLOFT) 100 MG tablet, Take 1 tablet by mouth daily., Disp: , Rfl:    traZODone (DESYREL) 50 MG tablet, Take 50 mg by mouth at bedtime., Disp: , Rfl:    Review of Systems  Per HPI unless specifically indicated above     Objective:    BP 93/63   Pulse (!) 123   Temp (!) 97.1 F (36.2 C)   Wt 146 lb 5 oz (66.4 kg)   SpO2 99%   BMI 22.25 kg/m   Wt Readings from Last 3 Encounters:  12/31/21 146 lb 5 oz (66.4 kg)  12/26/21 144 lb (65.3 kg)  12/13/21 144 lb 2.9 oz (65.4 kg)    Physical Exam Vitals reviewed.  Constitutional:       General: He is not in acute distress.    Appearance: He is not toxic-appearing.  HENT:     Head: Normocephalic and atraumatic.  Cardiovascular:     Rate and Rhythm: Regular rhythm. Tachycardia present.  Pulmonary:     Effort: Pulmonary effort is normal.     Breath sounds: Normal breath sounds. No wheezing.  Abdominal:     General: Bowel sounds are normal.     Palpations: Abdomen is soft.     Tenderness: There is no abdominal tenderness.  Musculoskeletal:     Cervical back: Neck supple.     Right lower leg: No edema.     Left lower leg: No edema.  Lymphadenopathy:     Cervical: No cervical adenopathy.  Skin:    General: Skin is warm and dry.  Neurological:     Mental Status: He is alert and oriented to person, place, and time.  Psychiatric:        Behavior: Behavior normal.          Random bs    316     Assessment & Plan:    Encounter Diagnoses  Name Primary?   Uncontrolled type 2 diabetes mellitus with hyperglycemia (HCC) Yes   Tachycardia    Constipation, unspecified constipation type    Diarrhea, unspecified type    Tobacco use disorder    Depression, unspecified depression type       GI issues: -Pt saw Dr Levon Hedger 11/09/21 who recommended evaluation at [facility].  -Per care connect, pt has [facility] financial assistance until  01/23/2023. -Will Check with GI to see if [facility] referral acceptable. -Pt is encouraged to contact his GI doctor to see if they have recommendations for his constipation and diarrhea -Discussed with pt that he may be making his sitiuation worse by taking PEG then imodium.     DM: -Pt urged to reschedule with endocrinologist.   -Discussed with pt the dangers of his significantly elevated bs including long- term organ damage and premature death. -he is encouraged to monitor his bs, follow DM diet and use his insulin as directed by his endocrinologist  Tachycardia -Likely due to dehydration secondary to GI issues and elevated bs.  Pt encouraged  to drink plenty of water and control blood sugars.     Pt is scheduled to RTO in one month.  He is to contact office sooner  prn

## 2021-12-31 NOTE — Patient Instructions (Addendum)
-Schedule appointment with endocrinologist- about DM -Contact gastroenterologist- about constipation and diarrhea   -Drink fluids ---------------------------------------------------  Dehydration, Adult Dehydration is condition in which there is not enough water or other fluids in the body. This happens when a person loses more fluids than he or she takes in. Important body parts cannot work right without the right amount of fluids. Any loss of fluids from the body can cause dehydration. Dehydration can be mild, worse, or very bad. It should be treated right away to keep it from getting very bad. What are the causes? This condition may be caused by: Conditions that cause loss of water or other fluids, such as: Watery poop (diarrhea). Vomiting. Sweating a lot. Peeing (urinating) a lot. Not drinking enough fluids, especially when you: Are ill. Are doing things that take a lot of energy to do. Other illnesses and conditions, such as fever or infection. Certain medicines, such as medicines that take extra fluid out of the body (diuretics). Lack of safe drinking water. Not being able to get enough water and food. What increases the risk? The following factors may make you more likely to develop this condition: Having a long-term (chronic) illness that has not been treated the right way, such as: Diabetes. Heart disease. Kidney disease. Being 3 years of age or older. Having a disability. Living in a place that is high above the ground or sea (high in altitude). The thinner, dried air causes more fluid loss. Doing exercises that put stress on your body for a long time. What are the signs or symptoms? Symptoms of dehydration depend on how bad it is. Mild or worse dehydration Thirst. Dry lips or dry mouth. Feeling dizzy or light-headed, especially when you stand up from sitting. Muscle cramps. Your body making: Dark pee (urine). Pee may be the color of tea. Less pee than  normal. Less tears than normal. Headache. Very bad dehydration Changes in skin. Skin may: Be cold to the touch (clammy). Be blotchy or pale. Not go back to normal right after you lightly pinch it and let it go. Little or no tears, pee, or sweat. Changes in vital signs, such as: Fast breathing. Low blood pressure. Weak pulse. Pulse that is more than 100 beats a minute when you are sitting still. Other changes, such as: Feeling very thirsty. Eyes that look hollow (sunken). Cold hands and feet. Being mixed up (confused). Being very tired (lethargic) or having trouble waking from sleep. Short-term weight loss. Loss of consciousness. How is this treated? Treatment for this condition depends on how bad it is. Treatment should start right away. Do not wait until your condition gets very bad. Very bad dehydration is an emergency. You will need to go to a hospital. Mild or worse dehydration can be treated at home. You may be asked to: Drink more fluids. Drink an oral rehydration solution (ORS). This drink helps get the right amounts of fluids and salts and minerals in the blood (electrolytes). Very bad dehydration can be treated: With fluids through an IV tube. By getting normal levels of salts and minerals in your blood. This is often done by giving salts and minerals through a tube. The tube is passed through your nose and into your stomach. By treating the root cause. Follow these instructions at home: Oral rehydration solution If told by your doctor, drink an ORS: Make an ORS. Use instructions on the package. Start by drinking small amounts, about  cup (120 mL) every 5-10 minutes. Slowly drink more  until you have had the amount that your doctor said to have. Eating and drinking        Drink enough clear fluid to keep your pee pale yellow. If you were told to drink an ORS, finish the ORS first. Then, start slowly drinking other clear fluids. Drink fluids such as: Water. Do not  drink only water. Doing that can make the salt (sodium) level in your body get too low. Water from ice chips you suck on. Fruit juice that you have added water to (diluted). Low-calorie sports drinks. Eat foods that have the right amounts of salts and minerals, such as: Bananas. Oranges. Potatoes. Tomatoes. Spinach. Do not drink alcohol. Avoid: Drinks that have a lot of sugar. These include: High-calorie sports drinks. Fruit juice that you did not add water to. Soda. Caffeine. Foods that are greasy or have a lot of fat or sugar. General instructions Take over-the-counter and prescription medicines only as told by your doctor. Do not take salt tablets. Doing that can make the salt level in your body get too high. Return to your normal activities as told by your doctor. Ask your doctor what activities are safe for you. Keep all follow-up visits as told by your doctor. This is important. Contact a doctor if: You have pain in your belly (abdomen) and the pain: Gets worse. Stays in one place. You have a rash. You have a stiff neck. You get angry or annoyed (irritable) more easily than normal. You are more tired or have a harder time waking than normal. You feel: Weak or dizzy. Very thirsty. Get help right away if you have: Any symptoms of very bad dehydration. Symptoms of vomiting, such as: You cannot eat or drink without vomiting. Your vomiting gets worse or does not go away. Your vomit has blood or green stuff in it. Symptoms that get worse with treatment. A fever. A very bad headache. Problems with peeing or pooping (having a bowel movement), such as: Watery poop that gets worse or does not go away. Blood in your poop (stool). This may cause poop to look black and tarry. Not peeing in 6-8 hours. Peeing only a small amount of very dark pee in 6-8 hours. Trouble breathing. These symptoms may be an emergency. Do not wait to see if the symptoms will go away. Get medical  help right away. Call your local emergency services (911 in the [place] Do not drive yourself to the hospital. Summary Dehydration is a condition in which there is not enough water or other fluids in the body. This happens when a person loses more fluids than he or she takes in. Treatment for this condition depends on how bad it is. Treatment should be started right away. Do not wait until your condition gets very bad. Drink enough clear fluid to keep your pee pale yellow. If you were told to drink an oral rehydration solution (ORS), finish the ORS first. Then, start slowly drinking other clear fluids. Take over-the-counter and prescription medicines only as told by your doctor. Get help right away if you have any symptoms of very bad dehydration. This information is not intended to replace advice given to you by your health care provider. Make sure you discuss any questions you have with your health care provider. Document Revised: 09/23/2021 Document Reviewed: 12/28/2018 Elsevier Patient Education  2023 [employer].

## 2022-01-09 ENCOUNTER — Emergency Department (HOSPITAL_COMMUNITY): Payer: Medicaid Other

## 2022-01-09 ENCOUNTER — Encounter (HOSPITAL_COMMUNITY): Payer: Self-pay | Admitting: *Deleted

## 2022-01-09 ENCOUNTER — Emergency Department (HOSPITAL_COMMUNITY)
Admission: EM | Admit: 2022-01-09 | Discharge: 2022-01-10 | Disposition: A | Discharge status: Home / Self Care | Payer: Medicaid Other | Attending: Emergency Medicine | Admitting: Emergency Medicine

## 2022-01-09 ENCOUNTER — Other Ambulatory Visit: Payer: Self-pay

## 2022-01-09 DIAGNOSIS — R7989 Other specified abnormal findings of blood chemistry: Secondary | ICD-10-CM | POA: Insufficient documentation

## 2022-01-09 DIAGNOSIS — I951 Orthostatic hypotension: Secondary | ICD-10-CM | POA: Insufficient documentation

## 2022-01-09 DIAGNOSIS — R42 Dizziness and giddiness: Secondary | ICD-10-CM | POA: Insufficient documentation

## 2022-01-09 DIAGNOSIS — R000 Tachycardia, unspecified: Secondary | ICD-10-CM | POA: Insufficient documentation

## 2022-01-09 DIAGNOSIS — I10 Essential (primary) hypertension: Secondary | ICD-10-CM | POA: Insufficient documentation

## 2022-01-09 DIAGNOSIS — Z7982 Long term (current) use of aspirin: Secondary | ICD-10-CM | POA: Insufficient documentation

## 2022-01-09 DIAGNOSIS — Z20822 Contact with and (suspected) exposure to covid-19: Secondary | ICD-10-CM | POA: Insufficient documentation

## 2022-01-09 DIAGNOSIS — E860 Dehydration: Secondary | ICD-10-CM

## 2022-01-09 DIAGNOSIS — D72829 Elevated white blood cell count, unspecified: Secondary | ICD-10-CM | POA: Insufficient documentation

## 2022-01-09 DIAGNOSIS — Z7951 Long term (current) use of inhaled steroids: Secondary | ICD-10-CM | POA: Insufficient documentation

## 2022-01-09 DIAGNOSIS — J449 Chronic obstructive pulmonary disease, unspecified: Secondary | ICD-10-CM | POA: Insufficient documentation

## 2022-01-09 DIAGNOSIS — Z7984 Long term (current) use of oral hypoglycemic drugs: Secondary | ICD-10-CM | POA: Insufficient documentation

## 2022-01-09 DIAGNOSIS — Z794 Long term (current) use of insulin: Secondary | ICD-10-CM | POA: Insufficient documentation

## 2022-01-09 DIAGNOSIS — I2510 Atherosclerotic heart disease of native coronary artery without angina pectoris: Secondary | ICD-10-CM | POA: Insufficient documentation

## 2022-01-09 LAB — CBC WITH DIFFERENTIAL/PLATELET
Abs Immature Granulocytes: 0.06 10*3/uL (ref 0.00–0.07)
Basophils Absolute: 0.1 10*3/uL (ref 0.0–0.1)
Basophils Relative: 1 %
Eosinophils Absolute: 0.2 10*3/uL (ref 0.0–0.5)
Eosinophils Relative: 1 %
HCT: 42.3 % (ref 39.0–52.0)
Hemoglobin: 14.6 g/dL (ref 13.0–17.0)
Immature Granulocytes: 0 %
Lymphocytes Relative: 25 %
Lymphs Abs: 3.9 10*3/uL (ref 0.7–4.0)
MCH: 31.2 pg (ref 26.0–34.0)
MCHC: 34.5 g/dL (ref 30.0–36.0)
MCV: 90.4 fL (ref 80.0–100.0)
Monocytes Absolute: 0.6 10*3/uL (ref 0.1–1.0)
Monocytes Relative: 4 %
Neutro Abs: 10.8 10*3/uL — ABNORMAL HIGH (ref 1.7–7.7)
Neutrophils Relative %: 69 %
Platelets: 282 10*3/uL (ref 150–400)
RBC: 4.68 MIL/uL (ref 4.22–5.81)
RDW: 13 % (ref 11.5–15.5)
WBC: 15.6 10*3/uL — ABNORMAL HIGH (ref 4.0–10.5)
nRBC: 0 % (ref 0.0–0.2)

## 2022-01-09 LAB — URINALYSIS, ROUTINE W REFLEX MICROSCOPIC
Bacteria, UA: NONE SEEN
Bilirubin Urine: NEGATIVE
Glucose, UA: >=500 mg/dL — AB
Hgb urine dipstick: NEGATIVE
Ketones, ur: 5 mg/dL — AB
Leukocytes,Ua: NEGATIVE
Nitrite: NEGATIVE
Protein, ur: 100 mg/dL — AB
Specific Gravity, Urine: 1.011 (ref 1.005–1.030)
pH: 6 (ref 5.0–8.0)

## 2022-01-09 LAB — APTT: aPTT: 26 seconds (ref 24–36)

## 2022-01-09 LAB — COMPREHENSIVE METABOLIC PANEL
ALT: 16 U/L (ref 0–44)
AST: 12 U/L — ABNORMAL LOW (ref 15–41)
Albumin: 3.2 g/dL — ABNORMAL LOW (ref 3.5–5.0)
Alkaline Phosphatase: 87 U/L (ref 38–126)
Anion gap: 8 (ref 5–15)
BUN: 31 mg/dL — ABNORMAL HIGH (ref 6–20)
CO2: 21 mmol/L — ABNORMAL LOW (ref 22–32)
Calcium: 8.4 mg/dL — ABNORMAL LOW (ref 8.9–10.3)
Chloride: 103 mmol/L (ref 98–111)
Creatinine, Ser: 1.33 mg/dL — ABNORMAL HIGH (ref 0.61–1.24)
GFR, Estimated: >60 mL/min (ref >60)
Glucose, Bld: 303 mg/dL — ABNORMAL HIGH (ref 70–99)
Potassium: 4.9 mmol/L (ref 3.5–5.1)
Sodium: 132 mmol/L — ABNORMAL LOW (ref 135–145)
Total Bilirubin: 0.9 mg/dL (ref 0.3–1.2)
Total Protein: 6.5 g/dL (ref 6.5–8.1)

## 2022-01-09 LAB — LACTIC ACID, PLASMA
Lactic Acid, Venous: 1.6 mmol/L (ref 0.5–1.9)
Lactic Acid, Venous: 1.2 mmol/L (ref 0.5–1.9)

## 2022-01-09 LAB — TROPONIN I (HIGH SENSITIVITY)
Troponin I (High Sensitivity): 7 ng/L (ref <18)
Troponin I (High Sensitivity): 7 ng/L (ref <18)

## 2022-01-09 LAB — RESP PANEL BY RT-PCR (FLU A&B, COVID) ARPGX2
Influenza A by PCR: NEGATIVE
Influenza B by PCR: NEGATIVE
SARS Coronavirus 2 by RT PCR: NEGATIVE

## 2022-01-09 LAB — PROTIME-INR
INR: 1 (ref 0.8–1.2)
Prothrombin Time: 12.9 seconds (ref 11.4–15.2)

## 2022-01-09 LAB — D-DIMER, QUANTITATIVE: D-Dimer, Quant: 0.32 ug/mL-FEU (ref 0.00–0.50)

## 2022-01-09 MED ORDER — SODIUM CHLORIDE 0.9 % IV BOLUS (SEPSIS)
1000.0000 mL | Freq: Once | INTRAVENOUS | Status: AC
  Administered 2022-01-09: 1000 mL via INTRAVENOUS

## 2022-01-09 MED ORDER — SODIUM CHLORIDE 0.9 % IV SOLN
2.0000 g | INTRAVENOUS | Status: DC
  Administered 2022-01-09: 2 g via INTRAVENOUS
  Filled 2022-01-09: qty 20

## 2022-01-09 MED ORDER — LACTATED RINGERS IV SOLN
INTRAVENOUS | Status: DC
Start: 2022-01-09 — End: 2022-01-10

## 2022-01-09 NOTE — Discharge Instructions (Signed)
Continue your current medications. Follow-up with your primary care doctor. Return as needed for worsening symptoms °

## 2022-01-09 NOTE — ED Provider Notes (Signed)
[facility] EMERGENCY DEPARTMENT Provider Note   CSN: [id number] Arrival date & time: 01/09/22  1906     History {Add pertinent medical, surgical, social history, OB history to HPI:1} Chief Complaint  Patient presents with   Hypotension    [name] [name] is a 51 y.o. male.  HPI   Patient has history of multiple medical problems including hypertension, hyperlipidemia, depression, anxiety, reflux, coronary artery disease, COPD, migraines.  Patient has had multiple episodes of dizziness with hyperglycemia and syncope.  Patient was last admitted to hospital on July 16 for syncopal episode.  Patient states he was at home today and when he went to get up he felt lightheaded and dizzy.  He fell to the ground and felt like he was going to pass out.  Family took his blood pressure and it was in the 60s.  Patient states he did not hit his head but he is having a headache now.  Patient denies any chest pain.  No shortness of breath.  He denies any vomiting or diarrhea.  Home Medications Prior to Admission medications   Medication Sig Start Date End Date Taking? Authorizing Provider  acetaminophen (TYLENOL) 325 MG tablet Take 650 mg by mouth every 6 (six) hours as needed for moderate pain.    [provider]  albuterol (VENTOLIN HFA) 108 (90 Base) MCG/ACT inhaler INHALE 2 PUFFS BY MOUTH EVERY 6 HOURS AS NEEDED FOR COUGHING, WHEEZING, OR SHORTNESS OF BREATH Patient taking differently: Inhale 2 puffs into the lungs every 6 (six) hours as needed for wheezing or shortness of breath. INHALE 2 PUFFS BY MOUTH EVERY 6 HOURS AS NEEDED FOR COUGHING, WHEEZING, OR SHORTNESS OF BREATH 09/09/21   [name], PA-C  ARIPiprazole (ABILIFY) 10 MG tablet Take 10 mg by mouth daily.    [provider]  ASPIRIN LOW DOSE 81 MG EC tablet Take 81 mg by mouth every morning. 09/05/20   [provider]  atorvastatin (LIPITOR) 80 MG tablet Take 1 tablet (80 mg total) by mouth daily. 12/31/21    [name], PA-C  famotidine (PEPCID) 40 MG tablet Take 1 tablet (40 mg total) by mouth 2 (two) times daily. 05/26/21   [name], PA-C  gabapentin (NEURONTIN) 300 MG capsule Take 1 capsule (300 mg total) by mouth every 8 (eight) hours as needed. 10/22/21 12/31/21  Terald Sleeper, MD  insulin aspart protamine - aspart (NOVOLOG MIX 70/30 FLEXPEN) (70-30) 100 UNIT/ML FlexPen Inject 50-60 Units into the skin 2 (two) times daily with a meal. Only with breakfast and supper when premeal glucose is above 90 Patient taking differently: Inject 50-60 Units into the skin 2 (two) times daily with a meal. Only with breakfast and supper when premeal glucose is above 90 60 units in the morning and 50 every evening 09/08/21   Roma Kayser, MD  isosorbide mononitrate (IMDUR) 30 MG 24 hr tablet Take 30 mg by mouth daily. 11/30/21   [provider]  loperamide (IMODIUM) 2 MG capsule Take 1 capsule (2 mg total) by mouth 4 (four) times daily as needed for diarrhea or loose stools. 11/29/21   Sabas Sous, MD  metFORMIN (GLUCOPHAGE) 500 MG tablet TAKE 1 Tablet  BY MOUTH TWICE DAILY WITH A MEAL 10/28/21   Nida, Denman George, MD  nitroGLYCERIN (NITROSTAT) 0.4 MG SL tablet Place 1 tablet (0.4 mg total) under the tongue every 5 (five) minutes as needed for chest pain. 10/05/17   [name], PA-C  ondansetron (ZOFRAN) 4 MG  tablet     [provider]  polyethylene glycol (MIRALAX / GLYCOLAX) 17 g packet Take 17 g by mouth daily. 11/11/20   Gilda Crease, MD  sertraline (ZOLOFT) 100 MG tablet Take 1 tablet by mouth daily. 01/09/20   [provider]  traZODone (DESYREL) 50 MG tablet Take 50 mg by mouth at bedtime. 10/03/20   [provider]      Allergies    Omeprazole magnesium, Bee venom, Esomeprazole, and Morphine and related    Review of Systems   Review of Systems  Physical Exam Updated Vital Signs BP (!) 88/55 (BP Location: Right Arm)   Pulse (!) 116    Temp 99.2 F (37.3 C) (Oral)   Resp 16   Ht 1.727 m (5\' 8" )   Wt 65.3 kg   SpO2 98%   BMI 21.90 kg/m  Physical Exam Vitals and nursing note reviewed.  Constitutional:      General: He is not in acute distress.    Appearance: He is well-developed.  HENT:     Head: Normocephalic and atraumatic.     Right Ear: External ear normal.     Left Ear: External ear normal.  Eyes:     General: No scleral icterus.       Right eye: No discharge.        Left eye: No discharge.     Conjunctiva/sclera: Conjunctivae normal.  Neck:     Trachea: No tracheal deviation.  Cardiovascular:     Rate and Rhythm: Regular rhythm. Tachycardia present.  Pulmonary:     Effort: Pulmonary effort is normal. No respiratory distress.     Breath sounds: Normal breath sounds. No stridor. No wheezing or rales.  Abdominal:     General: Bowel sounds are normal. There is no distension.     Palpations: Abdomen is soft.     Tenderness: There is no abdominal tenderness. There is no guarding or rebound.  Musculoskeletal:        General: No tenderness or deformity.     Cervical back: Neck supple.  Skin:    General: Skin is warm and dry.     Findings: No rash.  Neurological:     General: No focal deficit present.     Mental Status: He is alert.     Cranial Nerves: No cranial nerve deficit (no facial droop, extraocular movements intact, no slurred speech).     Sensory: No sensory deficit.     Motor: No abnormal muscle tone or seizure activity.     Coordination: Coordination normal.  Psychiatric:        Mood and Affect: Mood normal.     ED Results / Procedures / Treatments   Labs (all labs ordered are listed, but only abnormal results are displayed) Labs Reviewed - No data to display  EKG None  Radiology No results found.  Procedures Procedures  {Document cardiac monitor, telemetry assessment procedure when appropriate:1}  Medications Ordered in ED Medications  lactated ringers infusion (has no  administration in time range)  cefTRIAXone (ROCEPHIN) 2 g in sodium chloride 0.9 % 100 mL IVPB (has no administration in time range)  sodium chloride 0.9 % bolus 1,000 mL (has no administration in time range)    And  sodium chloride 0.9 % bolus 1,000 mL (has no administration in time range)    ED Course/ Medical Decision Making/ A&P  Medical Decision Making Amount and/or Complexity of Data Reviewed Labs: ordered. Radiology: ordered. ECG/medicine tests: ordered.  Risk Prescription drug management.   ***  {Document critical care time when appropriate:1} {Document review of labs and clinical decision tools ie heart score, Chads2Vasc2 etc:1}  {Document your independent review of radiology images, and any outside records:1} {Document your discussion with family members, caretakers, and with consultants:1} {Document social determinants of health affecting pt's care:1} {Document your decision making why or why not admission, treatments were needed:1} Final Clinical Impression(s) / ED Diagnoses Final diagnoses:  None    Rx / DC Orders ED Discharge Orders     None

## 2022-01-09 NOTE — ED Triage Notes (Signed)
Pt here for hypotension. C/o HA and body aches.

## 2022-01-09 NOTE — Progress Notes (Signed)
Pt being followed by [facility] for Sepsis protocol. 

## 2022-01-10 NOTE — ED Notes (Addendum)
Pt alert, oriented and ambulatory with steady gait upon DC. [name] [name] Gerrianne Scale

## 2022-01-11 ENCOUNTER — Emergency Department (HOSPITAL_COMMUNITY)
Admission: EM | Admit: 2022-01-11 | Discharge: 2022-01-11 | Disposition: A | Discharge status: Home / Self Care | Payer: Self-pay | Attending: Emergency Medicine | Admitting: Emergency Medicine

## 2022-01-11 ENCOUNTER — Other Ambulatory Visit: Payer: Self-pay

## 2022-01-11 ENCOUNTER — Encounter (HOSPITAL_COMMUNITY): Payer: Self-pay

## 2022-01-11 DIAGNOSIS — E875 Hyperkalemia: Secondary | ICD-10-CM | POA: Insufficient documentation

## 2022-01-11 DIAGNOSIS — E1165 Type 2 diabetes mellitus with hyperglycemia: Secondary | ICD-10-CM | POA: Insufficient documentation

## 2022-01-11 DIAGNOSIS — Z7984 Long term (current) use of oral hypoglycemic drugs: Secondary | ICD-10-CM | POA: Insufficient documentation

## 2022-01-11 DIAGNOSIS — Z794 Long term (current) use of insulin: Secondary | ICD-10-CM | POA: Insufficient documentation

## 2022-01-11 DIAGNOSIS — I2510 Atherosclerotic heart disease of native coronary artery without angina pectoris: Secondary | ICD-10-CM | POA: Insufficient documentation

## 2022-01-11 DIAGNOSIS — Z7982 Long term (current) use of aspirin: Secondary | ICD-10-CM | POA: Insufficient documentation

## 2022-01-11 DIAGNOSIS — R739 Hyperglycemia, unspecified: Secondary | ICD-10-CM

## 2022-01-11 DIAGNOSIS — I10 Essential (primary) hypertension: Secondary | ICD-10-CM | POA: Insufficient documentation

## 2022-01-11 LAB — BASIC METABOLIC PANEL
Anion gap: 8 (ref 5–15)
BUN: 19 mg/dL (ref 6–20)
CO2: 22 mmol/L (ref 22–32)
Calcium: 8.8 mg/dL — ABNORMAL LOW (ref 8.9–10.3)
Chloride: 101 mmol/L (ref 98–111)
Creatinine, Ser: 1.46 mg/dL — ABNORMAL HIGH (ref 0.61–1.24)
GFR, Estimated: 58 mL/min — ABNORMAL LOW (ref >60)
Glucose, Bld: 428 mg/dL — ABNORMAL HIGH (ref 70–99)
Potassium: 6.3 mmol/L (ref 3.5–5.1)
Sodium: 131 mmol/L — ABNORMAL LOW (ref 135–145)

## 2022-01-11 LAB — BLOOD GAS, VENOUS
Acid-Base Excess: 3.4 mmol/L — ABNORMAL HIGH (ref 0.0–2.0)
Bicarbonate: 26.8 mmol/L (ref 20.0–28.0)
Drawn by: 6892
O2 Saturation: 32.7 %
Patient temperature: 37.1
pCO2, Ven: 36 mmHg — ABNORMAL LOW (ref 44–60)
pH, Ven: 7.48 — ABNORMAL HIGH (ref 7.25–7.43)
pO2, Ven: <31 mmHg — CL (ref 32–45)

## 2022-01-11 LAB — CBC
HCT: 42.3 % (ref 39.0–52.0)
Hemoglobin: 14.9 g/dL (ref 13.0–17.0)
MCH: 31.8 pg (ref 26.0–34.0)
MCHC: 35.2 g/dL (ref 30.0–36.0)
MCV: 90.2 fL (ref 80.0–100.0)
Platelets: 279 10*3/uL (ref 150–400)
RBC: 4.69 MIL/uL (ref 4.22–5.81)
RDW: 13 % (ref 11.5–15.5)
WBC: 13.1 10*3/uL — ABNORMAL HIGH (ref 4.0–10.5)
nRBC: 0 % (ref 0.0–0.2)

## 2022-01-11 LAB — CBG MONITORING, ED
Glucose-Capillary: 451 mg/dL — ABNORMAL HIGH (ref 70–99)
Glucose-Capillary: 246 mg/dL — ABNORMAL HIGH (ref 70–99)

## 2022-01-11 LAB — BETA-HYDROXYBUTYRIC ACID: Beta-Hydroxybutyric Acid: 0.19 mmol/L (ref 0.05–0.27)

## 2022-01-11 LAB — POTASSIUM: Potassium: 4.1 mmol/L (ref 3.5–5.1)

## 2022-01-11 LAB — MAGNESIUM: Magnesium: 1.8 mg/dL (ref 1.7–2.4)

## 2022-01-11 MED ORDER — SODIUM CHLORIDE 0.9 % IV BOLUS
1000.0000 mL | Freq: Once | INTRAVENOUS | Status: AC
  Administered 2022-01-11: 1000 mL via INTRAVENOUS

## 2022-01-11 MED ORDER — SODIUM ZIRCONIUM CYCLOSILICATE 5 G PO PACK
10.0000 g | PACK | Freq: Once | ORAL | Status: AC
  Administered 2022-01-11: 10 g via ORAL
  Filled 2022-01-11: qty 2

## 2022-01-11 MED ORDER — LACTATED RINGERS IV BOLUS
1000.0000 mL | Freq: Once | INTRAVENOUS | Status: AC
  Administered 2022-01-11: 1000 mL via INTRAVENOUS

## 2022-01-11 MED ORDER — ALBUTEROL SULFATE (2.5 MG/3ML) 0.083% IN NEBU
5.0000 mg | INHALATION_SOLUTION | Freq: Once | RESPIRATORY_TRACT | Status: AC
Start: 2022-01-11 — End: 2022-01-11
  Administered 2022-01-11: 5 mg via RESPIRATORY_TRACT
  Filled 2022-01-11: qty 6

## 2022-01-11 MED ORDER — INSULIN ASPART 100 UNIT/ML IV SOLN
5.0000 [IU] | Freq: Once | INTRAVENOUS | Status: AC
Start: 2022-01-11 — End: 2022-01-11
  Administered 2022-01-11: 5 [IU] via INTRAVENOUS

## 2022-01-11 NOTE — ED Triage Notes (Signed)
Pt complaining of high blood sugar of 510 and low blood pressure today. Felt like he could not wait for his doctor tomorrow.

## 2022-01-11 NOTE — ED Provider Notes (Signed)
[facility] EMERGENCY DEPARTMENT Provider Note   CSN: [id number] Arrival date & time: 01/11/22  1913     History {Add pertinent medical, surgical, social history, OB history to HPI:1} Chief Complaint  Patient presents with   Hyperglycemia    [name] [name] is a 51 y.o. male.   Hyperglycemia Patient presents for concern of high blood sugar and low blood pressure.  Medical history includes T2DM, GERD, anxiety, HTN, HLD, depression, OSA, COPD, CAD, and arthritis.  He was seen in the ED 2 days ago for dizziness, hyperglycemia, and syncope.  His blood pressure improved with IV hydration.  Today he reports***.     Home Medications Prior to Admission medications   Medication Sig Start Date End Date Taking? Authorizing Provider  acetaminophen (TYLENOL) 325 MG tablet Take 650 mg by mouth every 6 (six) hours as needed for moderate pain.    [provider]  albuterol (VENTOLIN HFA) 108 (90 Base) MCG/ACT inhaler INHALE 2 PUFFS BY MOUTH EVERY 6 HOURS AS NEEDED FOR COUGHING, WHEEZING, OR SHORTNESS OF BREATH Patient taking differently: Inhale 2 puffs into the lungs every 6 (six) hours as needed for wheezing or shortness of breath. INHALE 2 PUFFS BY MOUTH EVERY 6 HOURS AS NEEDED FOR COUGHING, WHEEZING, OR SHORTNESS OF BREATH 09/09/21   [name], PA-C  ARIPiprazole (ABILIFY) 10 MG tablet Take 10 mg by mouth daily.    [provider]  ASPIRIN LOW DOSE 81 MG EC tablet Take 81 mg by mouth every morning. 09/05/20   [provider]  atorvastatin (LIPITOR) 80 MG tablet Take 1 tablet (80 mg total) by mouth daily. 12/31/21   [name], PA-C  famotidine (PEPCID) 40 MG tablet Take 1 tablet (40 mg total) by mouth 2 (two) times daily. 05/26/21   [name], PA-C  gabapentin (NEURONTIN) 300 MG capsule Take 1 capsule (300 mg total) by mouth every 8 (eight) hours as needed. 10/22/21 12/31/21  Terald Sleeper, MD  insulin aspart protamine - aspart (NOVOLOG MIX 70/30  FLEXPEN) (70-30) 100 UNIT/ML FlexPen Inject 50-60 Units into the skin 2 (two) times daily with a meal. Only with breakfast and supper when premeal glucose is above 90 Patient taking differently: Inject 50-60 Units into the skin 2 (two) times daily with a meal. Only with breakfast and supper when premeal glucose is above 90 60 units in the morning and 50 every evening 09/08/21   Roma Kayser, MD  isosorbide mononitrate (IMDUR) 30 MG 24 hr tablet Take 30 mg by mouth daily. 11/30/21   [provider]  loperamide (IMODIUM) 2 MG capsule Take 1 capsule (2 mg total) by mouth 4 (four) times daily as needed for diarrhea or loose stools. 11/29/21   Sabas Sous, MD  metFORMIN (GLUCOPHAGE) 500 MG tablet TAKE 1 Tablet  BY MOUTH TWICE DAILY WITH A MEAL 10/28/21   Nida, Denman George, MD  nitroGLYCERIN (NITROSTAT) 0.4 MG SL tablet Place 1 tablet (0.4 mg total) under the tongue every 5 (five) minutes as needed for chest pain. 10/05/17   [name], PA-C  ondansetron (ZOFRAN) 4 MG tablet     [provider]  polyethylene glycol (MIRALAX / GLYCOLAX) 17 g packet Take 17 g by mouth daily. 11/11/20   Gilda Crease, MD  sertraline (ZOLOFT) 100 MG tablet Take 1 tablet by mouth daily. 01/09/20   [provider]  traZODone (DESYREL) 50 MG tablet Take 50 mg by mouth at bedtime. 10/03/20   [provider]  Allergies    Omeprazole magnesium, Bee venom, Esomeprazole, and Morphine and related    Review of Systems   Review of Systems  Physical Exam Updated Vital Signs BP 112/75   Pulse (!) 108   Temp 99.5 F (37.5 C) (Oral)   Resp 18   Ht 5\' 8"  (1.727 m)   Wt 65.3 kg   SpO2 99%   BMI 21.90 kg/m  Physical Exam  ED Results / Procedures / Treatments   Labs (all labs ordered are listed, but only abnormal results are displayed) Labs Reviewed  CBG MONITORING, ED - Abnormal; Notable for the following components:      Result Value   Glucose-Capillary 451 (*)     All other components within normal limits  BASIC METABOLIC PANEL  CBC  URINALYSIS, ROUTINE W REFLEX MICROSCOPIC    EKG None  Radiology CT Head Wo Contrast  Result Date: 01/09/2022 CLINICAL DATA:  Severe headache; syncope/presyncope EXAM: CT HEAD WITHOUT CONTRAST TECHNIQUE: Contiguous axial images were obtained from the base of the skull through the vertex without intravenous contrast. RADIATION DOSE REDUCTION: This exam was performed according to the departmental dose-optimization program which includes automated exposure control, adjustment of the mA and/or kV according to patient size and/or use of iterative reconstruction technique. COMPARISON:  CT 12/13/2021 FINDINGS: Brain: No intracranial hemorrhage, mass effect, or evidence of acute infarct. No hydrocephalus. No extra-axial fluid collection. Vascular: No hyperdense vessel or unexpected calcification. Skull: No fracture or focal lesion. Sinuses/Orbits: No acute finding. Paranasal sinuses and mastoid air cells are well aerated. Other: None. IMPRESSION: No acute intracranial abnormality. Electronically Signed   By: 12/15/2021 M.D.   On: 01/09/2022 20:52   DG Chest Port 1 View  Result Date: 01/09/2022 CLINICAL DATA:  Possible sepsis EXAM: PORTABLE CHEST 1 VIEW COMPARISON:  12/13/2021 FINDINGS: The heart size and mediastinal contours are within normal limits. Both lungs are clear. The visualized skeletal structures are unremarkable. IMPRESSION: No active disease. Electronically Signed   By: 12/15/2021 M.D.   On: 01/09/2022 20:32    Procedures Procedures  {Document cardiac monitor, telemetry assessment procedure when appropriate:1}  Medications Ordered in ED Medications - No data to display  ED Course/ Medical Decision Making/ A&P                           Medical Decision Making  ***  {Document critical care time when appropriate:1} {Document review of labs and clinical decision tools ie heart score, Chads2Vasc2  etc:1}  {Document your independent review of radiology images, and any outside records:1} {Document your discussion with family members, caretakers, and with consultants:1} {Document social determinants of health affecting pt's care:1} {Document your decision making why or why not admission, treatments were needed:1} Final Clinical Impression(s) / ED Diagnoses Final diagnoses:  None    Rx / DC Orders ED Discharge Orders     None

## 2022-01-11 NOTE — ED Notes (Signed)
Critical Value- Potassium 6.3 reported to EDP Dixon at this time.

## 2022-01-11 NOTE — Discharge Instructions (Signed)
Keep your appointment with your primary care doctor for tomorrow.  When you see her, discuss changes to your home blood sugar management in order to maintain better sugar control.  Return to the emergency department at any time for any new or worsening symptoms of concern.

## 2022-01-11 NOTE — ED Notes (Signed)
Ambulatory to tx room  

## 2022-01-11 NOTE — ED Notes (Signed)
Gave urinal for sample  ?

## 2022-01-12 ENCOUNTER — Encounter: Payer: Self-pay | Admitting: Physician Assistant

## 2022-01-12 ENCOUNTER — Ambulatory Visit: Payer: Self-pay | Admitting: Physician Assistant

## 2022-01-12 VITALS — BP 90/60 | HR 99 | Temp 97.7°F | Wt 148.7 lb

## 2022-01-12 DIAGNOSIS — E860 Dehydration: Secondary | ICD-10-CM

## 2022-01-12 DIAGNOSIS — E1165 Type 2 diabetes mellitus with hyperglycemia: Secondary | ICD-10-CM

## 2022-01-12 LAB — GLUCOSE, POCT (MANUAL RESULT ENTRY): POC Glucose: 343 mg/dl — AB (ref 70–99)

## 2022-01-12 MED ORDER — NOVOLOG MIX 70/30 FLEXPEN (70-30) 100 UNIT/ML ~~LOC~~ SUPN: PEN_INJECTOR | SUBCUTANEOUS | 2 refills | Status: DC

## 2022-01-12 NOTE — Progress Notes (Unsigned)
BP 90/60   Pulse 99   Temp 97.7 F (36.5 C)   Wt 148 lb 11.2 oz (67.4 kg)   SpO2 98%   BMI 22.61 kg/m    Subjective:    Patient ID: [name] [name], male    DOB: 05-17-1971, 51 y.o.   MRN: [id number]  HPI: [name] [name] is a 51 y.o. male presenting on 01/12/2022 for Blood Pressure Check (Pt states he has been having problem with his bp being low. Pt states lowest bp reading he's gotten at home on his automatic bp monitor was 66/50)   HPI   Chief Complaint  Patient presents with   Blood Pressure Check    Pt states he has been having problem with his bp being low. Pt states lowest bp reading he's gotten at home on his automatic bp monitor was 66/50     pt has been to ER twice since he was in for office visit on 12/31/21.   Most recently was last night.     He says he is ready to feel better and agrees to do what he needs to do which he hasn't done thus far.      He is using novolog 70/30 insulin - 65u qam and 50u at night.   He denies any low blood sugar readings.    His bs readings are always high.   He doesn't have log or meter with him at present but readings at recent ER visits are illustrative.  He is going to [facility] regularly and it taking his MH meds  Pt reports feeling drained.   No pain or specific complaints.       Relevant past medical, surgical, family and social history reviewed and updated as indicated. Interim medical history since our last visit reviewed. Allergies and medications reviewed and updated.    Current Outpatient Medications:    acetaminophen (TYLENOL) 325 MG tablet, Take 650 mg by mouth every 6 (six) hours as needed for moderate pain., Disp: , Rfl:    albuterol (VENTOLIN HFA) 108 (90 Base) MCG/ACT inhaler, INHALE 2 PUFFS BY MOUTH EVERY 6 HOURS AS NEEDED FOR COUGHING, WHEEZING, OR SHORTNESS OF BREATH (Patient taking differently: Inhale 2 puffs into the lungs every 6 (six) hours as needed for wheezing or shortness of breath. INHALE 2 PUFFS BY  MOUTH EVERY 6 HOURS AS NEEDED FOR COUGHING, WHEEZING, OR SHORTNESS OF BREATH), Disp: 20.1 g, Rfl: 0   ARIPiprazole (ABILIFY) 10 MG tablet, Take 10 mg by mouth daily., Disp: , Rfl:    ASPIRIN LOW DOSE 81 MG EC tablet, Take 81 mg by mouth every morning., Disp: , Rfl:    atorvastatin (LIPITOR) 80 MG tablet, Take 1 tablet (80 mg total) by mouth daily., Disp: 90 tablet, Rfl: 0   famotidine (PEPCID) 40 MG tablet, Take 1 tablet (40 mg total) by mouth 2 (two) times daily., Disp: 60 tablet, Rfl: 3   gabapentin (NEURONTIN) 300 MG capsule, Take 1 capsule (300 mg total) by mouth every 8 (eight) hours as needed., Disp: 30 capsule, Rfl: 0   insulin aspart protamine - aspart (NOVOLOG MIX 70/30 FLEXPEN) (70-30) 100 UNIT/ML FlexPen, Inject 50-60 Units into the skin 2 (two) times daily with a meal. Only with breakfast and supper when premeal glucose is above 90 (Patient taking differently: Inject into the skin 2 (two) times daily with a meal. Only with breakfast and supper when premeal glucose is above 90 65 units in the morning and 50 every evening), Disp:  30 mL, Rfl: 2   isosorbide mononitrate (IMDUR) 30 MG 24 hr tablet, Take 30 mg by mouth daily., Disp: , Rfl:    metFORMIN (GLUCOPHAGE) 500 MG tablet, TAKE 1 Tablet  BY MOUTH TWICE DAILY WITH A MEAL, Disp: 180 tablet, Rfl: 0   nitroGLYCERIN (NITROSTAT) 0.4 MG SL tablet, Place 1 tablet (0.4 mg total) under the tongue every 5 (five) minutes as needed for chest pain., Disp: 25 tablet, Rfl: 3   ondansetron (ZOFRAN) 4 MG tablet, , Disp: , Rfl:    polyethylene glycol (MIRALAX / GLYCOLAX) 17 g packet, Take 17 g by mouth daily., Disp: 14 each, Rfl: 0   sertraline (ZOLOFT) 100 MG tablet, Take 1 tablet by mouth daily., Disp: , Rfl:    traZODone (DESYREL) 50 MG tablet, Take 50 mg by mouth at bedtime., Disp: , Rfl:    loperamide (IMODIUM) 2 MG capsule, Take 1 capsule (2 mg total) by mouth 4 (four) times daily as needed for diarrhea or loose stools. (Patient not taking: Reported on  01/12/2022), Disp: 12 capsule, Rfl: 0    Review of Systems  Per HPI unless specifically indicated above     Objective:    BP 90/60   Pulse 99   Temp 97.7 F (36.5 C)   Wt 148 lb 11.2 oz (67.4 kg)   SpO2 98%   BMI 22.61 kg/m   Wt Readings from Last 3 Encounters:  01/12/22 148 lb 11.2 oz (67.4 kg)  01/11/22 144 lb (65.3 kg)  01/09/22 144 lb (65.3 kg)     Orthostatic VS for the past 24 hrs:  BP- Lying Pulse- Lying BP- Sitting Pulse- Sitting BP- Standing at 0 minutes Pulse- Standing at 0 minutes  01/12/22 0813 143/90 100 122/79 102 (!) 89/60 109     Fasting bs  351      Physical Exam Constitutional:      General: He is not in acute distress.    Appearance: He is ill-appearing. He is not toxic-appearing.  HENT:     Head: Normocephalic and atraumatic.  Cardiovascular:     Rate and Rhythm: Regular rhythm. Tachycardia present.  Pulmonary:     Effort: Pulmonary effort is normal. No respiratory distress.     Breath sounds: Normal breath sounds.  Abdominal:     General: Bowel sounds are normal.     Palpations: Abdomen is soft. There is no mass.     Tenderness: There is no abdominal tenderness. There is no guarding or rebound.  Musculoskeletal:     Right lower leg: No edema.     Left lower leg: No edema.  Skin:    General: Skin is warm and dry.  Neurological:     Mental Status: He is alert and oriented to person, place, and time.  Psychiatric:        Behavior: Behavior normal.              Assessment & Plan:     Encounter Diagnoses  Name Primary?   Uncontrolled type 2 diabetes mellitus with hyperglycemia (HCC) Yes   Dehydration       -pt with obvious dehydration and hyperglycemia, both of which have been going on for some time -He likes to drink from a large [employer] cup that he has at home.  Www states cup holds 30oz.  Pt to increase water to at least 6 large [employer] cups/day of water -pt to increase Insulin to 70//60 units -he was scheduled  to go in to see  his endocrinologist and is given appointment information.   Emphasized the importance of this appointment with the patient.  He is reminded to take his bs log and his meter to his appointment  -pt is encouraged to eat small portions frequently.  He is encouraged to Avoid sweets and limit breads, pasta -long conversation with pt that until he gets his bs controlled, he will continue to feel poorly.  He says he understands and will go to his appointment with endocrinology next week. -he already has appointment to follow up here 01/27/22.  He is to contact office sooner prn

## 2022-01-14 LAB — CULTURE, BLOOD (ROUTINE X 2)
Culture: NO GROWTH
Culture: NO GROWTH
Special Requests: ADEQUATE
Special Requests: ADEQUATE

## 2022-01-15 ENCOUNTER — Telehealth: Payer: Self-pay

## 2022-01-15 NOTE — Telephone Encounter (Signed)
Attempted to call client of [facility] for follow up. No answer, left voicemail requesting return call.   Client has upcoming endocrine appointment for 01/18/22 and then [facility] 01/27/22.   [name] RN [employer]

## 2022-01-18 ENCOUNTER — Ambulatory Visit (INDEPENDENT_AMBULATORY_CARE_PROVIDER_SITE_OTHER): Payer: Self-pay | Admitting: "Endocrinology

## 2022-01-18 ENCOUNTER — Encounter: Payer: Self-pay | Admitting: "Endocrinology

## 2022-01-18 ENCOUNTER — Emergency Department (HOSPITAL_COMMUNITY)
Admission: EM | Admit: 2022-01-18 | Discharge: 2022-01-18 | Disposition: A | Discharge status: Home / Self Care | Payer: Self-pay | Attending: Emergency Medicine | Admitting: Emergency Medicine

## 2022-01-18 ENCOUNTER — Emergency Department (HOSPITAL_COMMUNITY): Payer: Self-pay

## 2022-01-18 ENCOUNTER — Encounter (HOSPITAL_COMMUNITY): Payer: Self-pay

## 2022-01-18 ENCOUNTER — Other Ambulatory Visit: Payer: Self-pay

## 2022-01-18 VITALS — BP 76/54 | HR 88 | Ht 68.0 in | Wt 147.4 lb

## 2022-01-18 DIAGNOSIS — E782 Mixed hyperlipidemia: Secondary | ICD-10-CM

## 2022-01-18 DIAGNOSIS — Z794 Long term (current) use of insulin: Secondary | ICD-10-CM | POA: Insufficient documentation

## 2022-01-18 DIAGNOSIS — F17200 Nicotine dependence, unspecified, uncomplicated: Secondary | ICD-10-CM

## 2022-01-18 DIAGNOSIS — I10 Essential (primary) hypertension: Secondary | ICD-10-CM

## 2022-01-18 DIAGNOSIS — E1142 Type 2 diabetes mellitus with diabetic polyneuropathy: Secondary | ICD-10-CM | POA: Insufficient documentation

## 2022-01-18 DIAGNOSIS — E860 Dehydration: Secondary | ICD-10-CM | POA: Insufficient documentation

## 2022-01-18 DIAGNOSIS — J45909 Unspecified asthma, uncomplicated: Secondary | ICD-10-CM | POA: Insufficient documentation

## 2022-01-18 DIAGNOSIS — Z20822 Contact with and (suspected) exposure to covid-19: Secondary | ICD-10-CM | POA: Insufficient documentation

## 2022-01-18 DIAGNOSIS — I2510 Atherosclerotic heart disease of native coronary artery without angina pectoris: Secondary | ICD-10-CM | POA: Insufficient documentation

## 2022-01-18 DIAGNOSIS — Z7984 Long term (current) use of oral hypoglycemic drugs: Secondary | ICD-10-CM | POA: Insufficient documentation

## 2022-01-18 DIAGNOSIS — E1165 Type 2 diabetes mellitus with hyperglycemia: Secondary | ICD-10-CM

## 2022-01-18 DIAGNOSIS — Z79899 Other long term (current) drug therapy: Secondary | ICD-10-CM | POA: Insufficient documentation

## 2022-01-18 DIAGNOSIS — I951 Orthostatic hypotension: Secondary | ICD-10-CM | POA: Insufficient documentation

## 2022-01-18 DIAGNOSIS — Z6822 Body mass index (BMI) 22.0-22.9, adult: Secondary | ICD-10-CM

## 2022-01-18 DIAGNOSIS — Z7982 Long term (current) use of aspirin: Secondary | ICD-10-CM | POA: Insufficient documentation

## 2022-01-18 DIAGNOSIS — Z91199 Patient's noncompliance with other medical treatment and regimen due to unspecified reason: Secondary | ICD-10-CM

## 2022-01-18 DIAGNOSIS — R42 Dizziness and giddiness: Secondary | ICD-10-CM | POA: Insufficient documentation

## 2022-01-18 DIAGNOSIS — J449 Chronic obstructive pulmonary disease, unspecified: Secondary | ICD-10-CM | POA: Insufficient documentation

## 2022-01-18 LAB — BASIC METABOLIC PANEL
Anion gap: 9 (ref 5–15)
BUN: 31 mg/dL — ABNORMAL HIGH (ref 6–20)
CO2: 24 mmol/L (ref 22–32)
Calcium: 8.6 mg/dL — ABNORMAL LOW (ref 8.9–10.3)
Chloride: 103 mmol/L (ref 98–111)
Creatinine, Ser: 1.16 mg/dL (ref 0.61–1.24)
GFR, Estimated: >60 mL/min (ref >60)
Glucose, Bld: 111 mg/dL — ABNORMAL HIGH (ref 70–99)
Potassium: 4.8 mmol/L (ref 3.5–5.1)
Sodium: 136 mmol/L (ref 135–145)

## 2022-01-18 LAB — RESP PANEL BY RT-PCR (FLU A&B, COVID) ARPGX2
Influenza A by PCR: NEGATIVE
Influenza B by PCR: NEGATIVE
SARS Coronavirus 2 by RT PCR: NEGATIVE

## 2022-01-18 LAB — CBC
HCT: 45.1 % (ref 39.0–52.0)
Hemoglobin: 15.5 g/dL (ref 13.0–17.0)
MCH: 31.6 pg (ref 26.0–34.0)
MCHC: 34.4 g/dL (ref 30.0–36.0)
MCV: 91.9 fL (ref 80.0–100.0)
Platelets: 340 10*3/uL (ref 150–400)
RBC: 4.91 MIL/uL (ref 4.22–5.81)
RDW: 12.9 % (ref 11.5–15.5)
WBC: 17.9 10*3/uL — ABNORMAL HIGH (ref 4.0–10.5)
nRBC: 0 % (ref 0.0–0.2)

## 2022-01-18 LAB — TROPONIN I (HIGH SENSITIVITY): Troponin I (High Sensitivity): 8 ng/L (ref <18)

## 2022-01-18 LAB — CBG MONITORING, ED: Glucose-Capillary: 109 mg/dL — ABNORMAL HIGH (ref 70–99)

## 2022-01-18 LAB — LACTIC ACID, PLASMA
Lactic Acid, Venous: 1.5 mmol/L (ref 0.5–1.9)
Lactic Acid, Venous: 1.7 mmol/L (ref 0.5–1.9)

## 2022-01-18 LAB — MAGNESIUM: Magnesium: 1.9 mg/dL (ref 1.7–2.4)

## 2022-01-18 MED ORDER — LACTATED RINGERS IV BOLUS
2000.0000 mL | Freq: Once | INTRAVENOUS | Status: AC
  Administered 2022-01-18: 2000 mL via INTRAVENOUS

## 2022-01-18 NOTE — Patient Instructions (Signed)

## 2022-01-18 NOTE — ED Provider Notes (Signed)
[facility] EMERGENCY DEPARTMENT Provider Note   CSN: [id number] Arrival date & time: 01/18/22  1205     History  Chief Complaint  Patient presents with   Hypotension    [name] [name] is a 51 y.o. male.  51 year old male with a history of COPD, tobacco use, IDDM, and HTN presents emergency department with low blood pressure from clinic.  Upon chart review patient has had low blood pressures recently.  On 8/16 he was seen at his primary care doctor's office reporting home blood pressures of 66 systolic.  Patient reports that he has been feeling lightheaded and dizzy recently.  Went to his endocrinology appointment today and was found to have a blood pressure of 76/54's while seated and 68/50 after standing.  Patient does report that this week he had 2 days where he had significant amount of nausea and vomiting and believes that he vomited over 10 times per day nonbloody nonbilious emesis.  Also with over 8 loose stools timeframe.  Says that he had 2 syncopal events since yesterday when he was standing to go up and do things.  Says that there were not any preceding symptoms and that he woke up on the ground.  Said a mild headache since then.  Also describes some mild chest tightness as well.  Denies any shortness of breath or cough.  Says that he has had urinary hesitancy.   On chart review has also had several ED presentations with CTs of the head as well as CTAs of the chest to evaluate for causes of his syncope and hypotension that have all been reassuring.  It has been felt that his symptoms are likely due to severe orthostasis and dehydration.  Denies substance use or heavy alcohol use.     Past Medical History:  Diagnosis Date   Anxiety    Arthritis    Asthma    CAD (coronary artery disease)    Moderate LAD disease 2016 - Dr. 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   Type 2 diabetes mellitus (HCC)       Home Medications Prior to Admission medications   Medication Sig Start Date End Date Taking? Authorizing Provider  aspirin EC 81 MG tablet Take 1 tablet by mouth daily with breakfast. 02/14/21  Yes [provider]  metoprolol tartrate (LOPRESSOR) 25 MG tablet Take 1 tablet by mouth 2 (two) times daily. 04/27/21  Yes [provider]  acetaminophen (TYLENOL) 325 MG tablet Take 650 mg by mouth every 6 (six) hours as needed for moderate pain.    [provider]  albuterol (VENTOLIN HFA) 108 (90 Base) MCG/ACT inhaler INHALE 2 PUFFS BY MOUTH EVERY 6 HOURS AS NEEDED FOR COUGHING, WHEEZING, OR SHORTNESS OF BREATH Patient taking differently: Inhale 2 puffs into the lungs every 6 (six) hours as needed for wheezing or shortness of breath. INHALE 2 PUFFS BY MOUTH EVERY 6 HOURS AS NEEDED FOR COUGHING, WHEEZING, OR SHORTNESS OF BREATH 09/09/21   [name], PA-C  ARIPiprazole (ABILIFY) 10 MG tablet Take 10 mg by mouth  daily.    [provider]  ASPIRIN LOW DOSE 81 MG EC tablet Take 81 mg by mouth every morning. 09/05/20   [provider]  atorvastatin (LIPITOR) 80 MG tablet Take 1 tablet (80 mg total) by mouth daily. 12/31/21   [name], PA-C  famotidine (PEPCID) 40 MG tablet Take 1 tablet (40 mg total) by mouth 2 (two) times daily. 05/26/21   [name], PA-C  gabapentin (NEURONTIN) 300 MG capsule Take 1 capsule (300 mg total) by mouth every 8 (eight) hours as needed. 10/22/21 01/12/22  Terald Sleeper, MD  gabapentin (NEURONTIN) 300 MG capsule one po tid    [provider]  insulin aspart protamine - aspart (NOVOLOG MIX 70/30 FLEXPEN) (70-30) 100 UNIT/ML FlexPen 70 units with  breakfast and 60 units with supper 01/12/22   [name], PA-C  isosorbide mononitrate (IMDUR) 30 MG 24 hr tablet Take 30 mg by mouth daily. 11/30/21   [provider]  loperamide (IMODIUM) 2 MG capsule Take 1 capsule (2 mg total) by mouth 4 (four) times daily as needed for diarrhea or loose stools. Patient not taking: Reported on 01/12/2022 11/29/21   Sabas Sous, MD  metFORMIN (GLUCOPHAGE) 500 MG tablet TAKE 1 Tablet  BY MOUTH TWICE DAILY WITH A MEAL 10/28/21   Roma Kayser, MD  nitroGLYCERIN (NITROSTAT) 0.4 MG SL tablet Place 1 tablet (0.4 mg total) under the tongue every 5 (five) minutes as needed for chest pain. 10/05/17   [name], PA-C  ondansetron (ZOFRAN) 4 MG tablet     [provider]  polyethylene glycol (MIRALAX / GLYCOLAX) 17 g packet Take 17 g by mouth daily. 11/11/20   Gilda Crease, MD  sertraline (ZOLOFT) 100 MG tablet Take 1 tablet by mouth daily. 01/09/20   [provider]  traZODone (DESYREL) 50 MG tablet Take 50 mg by mouth at bedtime. 10/03/20   [provider]      Allergies    Omeprazole magnesium, Bee venom, Esomeprazole, and Morphine and related    Review of Systems   Review of Systems  Physical Exam Updated Vital Signs BP 128/80   Pulse 100   Temp 98.7 F (37.1 C) (Oral)   Resp 16   Ht 5\' 8"  (1.727 m)   Wt 66.9 kg   SpO2 100%   BMI 22.41 kg/m  Physical Exam Vitals and nursing note reviewed.  Constitutional:      General: He is not in acute distress.    Appearance: He is well-developed.  HENT:     Head: Normocephalic and atraumatic.     Right Ear: External ear normal.     Left Ear: External ear normal.     Nose: Nose normal.  Eyes:     Extraocular Movements: Extraocular movements intact.     Conjunctiva/sclera: Conjunctivae normal.     Pupils: Pupils are equal, round, and reactive to light.  Cardiovascular:     Rate and Rhythm: Normal rate and regular rhythm.     Heart sounds: Normal  heart sounds.  Pulmonary:     Effort: Pulmonary effort is normal. No respiratory distress.     Breath sounds: Normal breath sounds.  Abdominal:     General: There is no distension.     Palpations: Abdomen is soft. There is no mass.     Tenderness: There is no abdominal tenderness. There is no right CVA tenderness, left CVA tenderness or guarding.  Musculoskeletal:  General: No swelling.     Cervical back: Normal range of motion and neck supple.     Right lower leg: No edema.     Left lower leg: No edema.  Skin:    General: Skin is warm and dry.     Capillary Refill: Capillary refill takes more than 3 seconds.  Neurological:     General: No focal deficit present.     Mental Status: He is alert and oriented to person, place, and time. Mental status is at baseline.     Sensory: No sensory deficit.     Motor: No weakness.  Psychiatric:        Mood and Affect: Mood normal.        Behavior: Behavior normal.     ED Results / Procedures / Treatments   Labs (all labs ordered are listed, but only abnormal results are displayed) Labs Reviewed  BASIC METABOLIC PANEL - Abnormal; Notable for the following components:      Result Value   Glucose, Bld 111 (*)    BUN 31 (*)    Calcium 8.6 (*)    All other components within normal limits  CBC - Abnormal; Notable for the following components:   WBC 17.9 (*)    All other components within normal limits  CBG MONITORING, ED - Abnormal; Notable for the following components:   Glucose-Capillary 109 (*)    All other components within normal limits  RESP PANEL BY RT-PCR (FLU A&B, COVID) ARPGX2  MAGNESIUM  LACTIC ACID, PLASMA  LACTIC ACID, PLASMA  URINALYSIS, ROUTINE W REFLEX MICROSCOPIC  CBG MONITORING, ED  TROPONIN I (HIGH SENSITIVITY)    EKG EKG Interpretation  Date/Time:  Monday January 18 2022 12:21:01 EDT Ventricular Rate:  120 PR Interval:  116 QRS Duration: 120 QT Interval:  348 QTC Calculation: 491 R Axis:   -57 Text  Interpretation: Sinus tachycardia Right bundle branch block Left anterior fascicular block  Bifascicular block Abnormal ECG When compared with ECG of 12/13/21 no significant change Confirmed by Vonita Moss [phone]) on 01/18/2022 12:46:45 PM  Radiology CT Head Wo Contrast  Result Date: 01/18/2022 CLINICAL DATA:  Head trauma, moderate-severe EXAM: CT HEAD WITHOUT CONTRAST TECHNIQUE: Contiguous axial images were obtained from the base of the skull through the vertex without intravenous contrast. RADIATION DOSE REDUCTION: This exam was performed according to the departmental dose-optimization program which includes automated exposure control, adjustment of the mA and/or kV according to patient size and/or use of iterative reconstruction technique. COMPARISON:  None Available. FINDINGS: Brain: No evidence of acute infarction, hemorrhage, hydrocephalus, extra-axial collection or mass lesion/mass effect. Vascular: No hyperdense vessel identified. Skull: No acute fracture. Sinuses/Orbits: Largely clear sinuses.  No acute orbital findings. Other: No mastoid effusions. IMPRESSION: No evidence of acute intracranial abnormality. Electronically Signed   By: Feliberto Harts M.D.   On: 01/18/2022 15:21   DG Chest 1 View  Result Date: 01/18/2022 CLINICAL DATA:  Syncopal episode EXAM: CHEST  1 VIEW COMPARISON:  01/09/2022 FINDINGS: The heart size and mediastinal contours are within normal limits. Both lungs are clear. The visualized skeletal structures are unremarkable. IMPRESSION: No active disease. Electronically Signed   By: Paulina Fusi M.D.   On: 01/18/2022 13:10    Procedures Procedures   Medications Ordered in ED Medications  lactated ringers bolus 2,000 mL (0 mLs Intravenous Stopped 01/18/22 1607)    ED Course/ Medical Decision Making/ A&P Clinical Course as of 01/18/22 2044  Mon Jan 18, 2022  1313 Attempted  to review patient's medications.  He is unsure of what medications that he takes but states  that last Monday he went to his primary doctor's appointment with all of his medications which they reviewed and ensured that he was not on any antihypertensives at this time. [RP]  1314 Chest x-ray reviewed and interpreted by me as showing no acute abnormality. [RP]    Clinical Course User Index [RP] Rondel Baton, MD                           Medical Decision Making Amount and/or Complexity of Data Reviewed Labs: ordered. Radiology: ordered.   51 year old male with a history of orthostatic hypotension, COPD, tobacco use, IDDM, and HTN presents emergency department with low blood pressure from clinic.  Initial DDx: Dehydration, orthostasis, autonomic dysregulation, shock, medication side effect, tbi Patient's had multiple presentations recently for similar complaints.  Ultimately has been found to be orthostatic.  Her antihypertensives that would be causing this and the patient states that he is gone over these with his primary doctor recently and has no medications that would cause that.  Reports trying to drink more water recently but does state that recently his p.o. intake has been down.  Did not have any infectious symptoms or chest pain or shortness of breath that be concerning for life-threatening causes of his hypotension.  Given his multiple reassuring evaluations do feel that this is likely due to orthostasis.  Plan:  Labs Lactate Troponin ECG Fluids CT head  ED Summary:  Patient underwent the above work-up which did not show evidence of TBI after his fall.  Labs were reassuring including his troponin and his EKG did not show any signs of arrhythmia.  Patient was given 2 L of fluids with improvement of his blood pressure.  He was noted to be persistently orthostatic but was able to ambulate safely while in the emergency department.  Given his reassuring evaluation today and on previous visits feel that symptoms are likely due to persistent orthostasis of unclear etiology.   Patient was instructed that he will need to discharge to stay well-hydrated and follow-up with cardiology as primary doctor regarding his symptoms.  Records reviewed previous admission documents and Care Everywhere/External Records  I independently visualized the following imaging with scope of interpretation limited to determining acute life threatening conditions related to emergency care: CT head, which revealed no acute abnormality  Final Clinical Impression(s) / ED Diagnoses Final diagnoses:  Orthostatic hypotension  Dehydration    Rx / DC Orders ED Discharge Orders          Ordered    Ambulatory referral to Cardiology       Comments: Severe recurrent orthostasis   01/18/22 1544              Rondel Baton, MD 01/18/22 2044

## 2022-01-18 NOTE — Progress Notes (Signed)
01/18/2022, 1:35 PM  Endocrinology follow-up note   Subjective:    Patient ID: [name] [name], male    DOB: December 13, 1970.  [name] [name] is being seen in follow-up after he was seen in consultation for management of currently uncontrolled symptomatic diabetes requested by  [name], PA-C.   Past Medical History:  Diagnosis Date   Anxiety    Arthritis    Asthma    CAD (coronary artery disease)    Moderate LAD disease 2016 - Dr. 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   Type 2 diabetes mellitus (HCC)     Past Surgical History:  Procedure Laterality Date   ANKLE SURGERY Right 1982   "had extra bones in there; took them out"   APPENDECTOMY  1975   BIOPSY  12/26/2018   Procedure: BIOPSY;  Surgeon: Malissa Hippo, MD;  [facility];  Service: Endoscopy;;  duodenal biopsies   BIOPSY  03/23/2019   Procedure: BIOPSY;  Surgeon: Malissa Hippo, MD;  [facility];  Service: Endoscopy;;  esophagus   CARDIAC CATHETERIZATION N/A 03/28/2015   Procedure: Left Heart Cath and Coronary Angiography;  Surgeon: Yates Decamp, MD;  Location: [facility] INVASIVE CV LAB;  Service: Cardiovascular;  Laterality: N/A;   CARDIAC CATHETERIZATION N/A 03/28/2015   Procedure: Intravascular Pressure Wire/FFR Study;  Surgeon: Yates Decamp, MD;  Location: [facility] INVASIVE CV LAB;  Service: Cardiovascular;  Laterality: N/A;   CARPAL TUNNEL RELEASE Left ~ 2008   COLONOSCOPY WITH ESOPHAGOGASTRODUODENOSCOPY (EGD)     COLONOSCOPY WITH PROPOFOL N/A 12/24/2019   Procedure: COLONOSCOPY WITH  PROPOFOL;  Surgeon: Malissa Hippo, MD;  [facility];  Service: Endoscopy;  Laterality: N/A;  955   ELBOW FRACTURE SURGERY Left ~ 2008   ESOPHAGEAL MANOMETRY N/A 09/16/2021   Procedure: ESOPHAGEAL MANOMETRY (EM);  Surgeon: Shellia Cleverly, DO;  Location: [facility];  Service: Endoscopy;  Laterality: N/A;   ESOPHAGOGASTRODUODENOSCOPY N/A 12/26/2018   Procedure: ESOPHAGOGASTRODUODENOSCOPY (EGD);  Surgeon: Malissa Hippo, MD;  [facility];  Service: Endoscopy;  Laterality: N/A;   ESOPHAGOGASTRODUODENOSCOPY (EGD) WITH PROPOFOL N/A 03/23/2019   Procedure: ESOPHAGOGASTRODUODENOSCOPY (EGD) WITH PROPOFOL;  Surgeon: Malissa Hippo, MD;  [facility];  Service: Endoscopy;  Laterality: N/A;  7:30   FRACTURE SURGERY     ankle and elbow   PH IMPEDANCE STUDY N/A 09/16/2021   Procedure: PH IMPEDANCE STUDY;  Surgeon: Shellia Cleverly, DO;  Location: [facility];  Service: Endoscopy;  Laterality: N/A;   PILONIDAL CYST EXCISION N/A 03/24/2017   Procedure: EXCISION CHRONIC  PILONIDAL ABSCESS;  Surgeon: [name] Miyamoto, MD;  Location: [facility];  Service: General;  Laterality: N/A;   SCROTAL  EXPLORATION N/A 11/13/2020   Procedure: EXCISION OF SEBACEOUS CYSTS, SCROTUM;  Surgeon: Malen Gauze, MD;  Location: [facility];  Service: Urology;  Laterality: N/A;   TENDON REPAIR Left ~ 2004   "main tendon in my ankle"    Social History   Socioeconomic History   Marital status: Single    Spouse name: Not on file   Number of children: 0   Years of education: 10th g   Highest education level: Not on file  Occupational History   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  Tobacco Use   Smoking status: Every Day    Packs/day: 0.50    Years: 34.00    Total pack years: 17.00    Types: Cigarettes   Smokeless tobacco: Never   Tobacco comments:    1/2 pack a day  Vaping Use   Vaping Use: Never used  Substance and Sexual Activity   Alcohol use: Never    Alcohol/week: 0.0  standard drinks of alcohol   Drug use: Never   Sexual activity: Not Currently  Other Topics Concern   Not on file  Social History Narrative   His main caretaker, his mother has terminal metastatic cancer.   Social Determinants of Health   Financial Resource Strain: Not on file  Food Insecurity: Not on file  Transportation Needs: Not on file  Physical Activity: Not on file  Stress: Not on file  Social Connections: Not on file    Family History  Problem Relation Age of Onset   Diabetes Mother    Hypertension Mother    Cancer Mother    Throat cancer Mother    Diabetes Father    Hypertension Father    Hyperlipidemia Father    Congestive Heart Failure Sister    Colon cancer Neg Hx    Pancreatic cancer Neg Hx    Liver disease Neg Hx     No facility-administered encounter medications on file as of 01/18/2022.   Outpatient Encounter Medications as of 01/18/2022  Medication Sig   acetaminophen (TYLENOL) 325 MG tablet Take 650 mg by mouth every 6 (six) hours as needed for moderate pain.   albuterol (VENTOLIN HFA) 108 (90 Base) MCG/ACT inhaler INHALE 2 PUFFS BY MOUTH EVERY 6 HOURS AS NEEDED FOR COUGHING, WHEEZING, OR SHORTNESS OF BREATH (Patient taking differently: Inhale 2 puffs into the lungs every 6 (six) hours as needed for wheezing or shortness of breath. INHALE 2 PUFFS BY MOUTH EVERY 6 HOURS AS NEEDED FOR COUGHING, WHEEZING, OR SHORTNESS OF BREATH)   ARIPiprazole (ABILIFY) 10 MG tablet Take 10 mg by mouth daily.   ASPIRIN LOW DOSE 81 MG EC tablet Take 81 mg by mouth every morning.   atorvastatin (LIPITOR) 80 MG tablet Take 1 tablet (80 mg total) by mouth daily.   famotidine (PEPCID) 40 MG tablet Take 1 tablet (40 mg total) by mouth 2 (two) times daily.   gabapentin (NEURONTIN) 300 MG capsule Take 1 capsule (300 mg total) by mouth every 8 (eight) hours as needed.   insulin aspart protamine - aspart (NOVOLOG MIX 70/30 FLEXPEN) (70-30) 100 UNIT/ML FlexPen 70 units with breakfast and  60 units with supper   isosorbide mononitrate (IMDUR) 30 MG 24 hr tablet Take 30 mg by mouth daily.   loperamide (IMODIUM) 2 MG capsule Take 1 capsule (2 mg total) by mouth 4 (four) times daily as needed for diarrhea or loose stools. (Patient not taking: Reported on 01/12/2022)   metFORMIN (GLUCOPHAGE) 500 MG tablet TAKE 1 Tablet  BY MOUTH TWICE DAILY WITH A MEAL   nitroGLYCERIN (NITROSTAT) 0.4 MG SL tablet Place 1 tablet (0.4 mg total) under the tongue every 5 (five) minutes as needed for chest pain.   ondansetron (ZOFRAN) 4 MG tablet    polyethylene glycol (MIRALAX / GLYCOLAX) 17 g packet Take 17 g by mouth daily.   sertraline (ZOLOFT) 100 MG tablet Take 1 tablet by mouth daily.   traZODone (DESYREL) 50 MG tablet Take 50 mg by mouth at bedtime.    ALLERGIES: Allergies  Allergen Reactions   Omeprazole Magnesium Swelling    Face swells, no breathing impairment   Bee Venom    Esomeprazole Swelling    Face swells, no breathing impairment   Morphine And Related     Itching at IV site, dry heaving, burning sensation in throat.    VACCINATION STATUS: Immunization History  Administered Date(s) Administered   Influenza-Unspecified 03/13/2019   Moderna Sars-Covid-2 Vaccination 09/11/2019, 10/11/2019   Pneumococcal Polysaccharide-23 05/25/2017    Diabetes He presents for his follow-up diabetic visit. He has type 2 diabetes mellitus. Onset time: He was diagnosed at approximate age of 39 years. His disease course has been worsening. There are no hypoglycemic associated symptoms. Pertinent negatives for hypoglycemia include no confusion, headaches, pallor or seizures. Associated symptoms include blurred vision, fatigue, polydipsia, polyuria and weakness. Pertinent negatives for diabetes include no chest pain and no polyphagia. There are no hypoglycemic complications. Symptoms are worsening. Diabetic complications include heart disease. Risk factors for coronary artery disease include diabetes  mellitus, dyslipidemia, male sex, hypertension, family history, tobacco exposure and sedentary lifestyle. Current diabetic treatment includes insulin injections. His weight is increasing steadily. He is following a generally unhealthy diet. When asked about meal planning, he reported none. He rarely participates in exercise. His home blood glucose trend is increasing steadily. His overall blood glucose range is >200 mg/dl. Dannielle Huh presents without any meter.  His most recent A1c was 13.7%, unchanged from his prior A1c of 13.2%.  He was supposed to be on NovoLog 70/30 60 units with breakfast and 50 units at supper, he insists that he has been taking this medication as prescribed.  His compliance is always questionable.  He was found to have presyncope with orthostatic hypotension today in the clinic. He did not document any hypoglycemia.) An ACE inhibitor/angiotensin II receptor blocker is being taken. He does not see a podiatrist.Eye exam is not current.  Hyperlipidemia This is a chronic problem. The current episode started more than 1 year ago. The problem is uncontrolled. Exacerbating diseases include diabetes. Pertinent negatives include no chest pain, myalgias or shortness of breath. Risk factors for coronary artery disease include dyslipidemia, diabetes mellitus, family history, male sex, hypertension and a sedentary lifestyle.  Hypertension This is a chronic problem. The current episode started more than 1 year ago. The problem is controlled. Associated symptoms include blurred vision. Pertinent negatives include no chest pain, headaches, neck pain, palpitations or shortness of breath. Risk factors for coronary artery disease include diabetes mellitus, dyslipidemia, family history, male gender, sedentary lifestyle and smoking/tobacco exposure. Past treatments include direct vasodilators. Hypertensive end-organ damage includes CAD/MI.     Review of Systems  Constitutional:  Positive for fatigue.  Negative for chills, fever and unexpected weight change.  HENT:  Negative for dental problem, mouth sores and trouble swallowing.   Eyes:  Positive for blurred vision. Negative for visual disturbance.  Respiratory:  Negative for cough, choking, chest tightness, shortness of breath and wheezing.   Cardiovascular:  Negative for chest pain, palpitations and leg swelling.  Gastrointestinal:  Negative for abdominal distention, abdominal pain, constipation, diarrhea, nausea and vomiting.  Endocrine: Positive for polydipsia and polyuria. Negative for polyphagia.  Genitourinary:  Negative for dysuria, flank pain, hematuria and urgency.  Musculoskeletal:  Negative for back pain, gait problem, myalgias and neck pain.  Skin:  Negative for pallor, rash and wound.  Neurological:  Positive for weakness. Negative for seizures, syncope, numbness and headaches.  Psychiatric/Behavioral:  Negative for confusion and dysphoric mood.     Objective:       01/18/2022   12:16 PM 01/18/2022   11:32 AM 01/12/2022    8:07 AM  Vitals with BMI  Height 5\' 8"  5\' 8"    Weight 147 lbs 6 oz 147 lbs 6 oz 148 lbs 11 oz  BMI 22.42 22.42 22.62  Systolic 102 76 90  Diastolic 70 54 60  Pulse 115 88 99    BP (!) 76/54 (BP Location: Left Arm, Patient Position: Sitting, Cuff Size: Normal) Comment: BP 68/50 L arm standing  Pulse 88   Ht 5\' 8"  (1.727 m)   Wt 147 lb 6.4 oz (66.9 kg)   BMI 22.41 kg/m   Wt Readings from Last 3 Encounters:  01/18/22 147 lb 6.4 oz (66.9 kg)  01/18/22 147 lb 6.4 oz (66.9 kg)  01/12/22 148 lb 11.2 oz (67.4 kg)    Reluctant affect   CMP     Component Value Date/Time   NA 136 01/18/2022 1231   K 4.8 01/18/2022 1231   CL 103 01/18/2022 1231   CO2 24 01/18/2022 1231   GLUCOSE 111 (H) 01/18/2022 1231   BUN 31 (H) 01/18/2022 1231   CREATININE 1.16 01/18/2022 1231   CREATININE 1.05 01/23/2019 0927   CALCIUM 8.6 (L) 01/18/2022 1231   PROT 6.5 01/09/2022 2000   ALBUMIN 3.2 (L) 01/09/2022  2000   AST 12 (L) 01/09/2022 2000   ALT 16 01/09/2022 2000   ALKPHOS 87 01/09/2022 2000   BILITOT 0.9 01/09/2022 2000   GFRNONAA >60 01/18/2022 1231   GFRNONAA 83 01/23/2019 0927   GFRAA >60 01/11/2020 0859   GFRAA 96 01/23/2019 0927     Diabetic Labs (most recent): Lab Results  Component Value Date   HGBA1C 13.7 (H) 12/13/2021   HGBA1C 13.2 (A) 09/08/2021   HGBA1C 12.5 (A) 06/10/2021   MICROALBUR 990.3 (H) 09/15/2021   MICROALBUR 811.7 (H) 04/07/2020   MICROALBUR 121.6 (H) 04/24/2019     Lipid Panel ( most recent) Lipid Panel     Component Value Date/Time   CHOL 259 (H) 09/15/2021 1013   TRIG 187 (H) 09/15/2021 1013   HDL 41 09/15/2021 1013   CHOLHDL 6.3 09/15/2021 1013   VLDL 37 09/15/2021 1013   LDLCALC 181 (H) 09/15/2021 1013   LDLDIRECT 181.3 (H) 11/03/2020 1139      Lab Results  Component Value Date   TSH 3.482 12/13/2021   TSH 1.772 05/24/2017   TSH 4.778 (H) 11/25/2015     Assessment & Plan:   Due to symptomatic hypotension, orthostatic hypotension patient was sent to ER directly from clinic with a note for better assessment and treatment.  1. Uncontrolled type 2 diabetes mellitus with hyperglycemia (HCC)   - Ezrael L Meneely has currently uncontrolled symptomatic type 2 DM since  51 years of age. Recent labs reviewed.  Tanmay presents without any meter.  His most recent A1c was 13.7%, unchanged from his prior A1c of 13.2%.  He was supposed  to be on NovoLog 70/30 60 units with breakfast and 50 units at supper, he insists that he has been taking this medication as prescribed.  His compliance is always questionable.  He was found to have presyncope with orthostatic hypotension today in the clinic. He did not document any hypoglycemia.   - I had a long discussion with him about the progressive nature of diabetes and the pathology behind its complications. -his diabetes is complicated by coronary artery disease and he remains at a high risk for more acute  and chronic complications which include CAD, CVA, CKD, retinopathy, and neuropathy. These are all discussed in detail with him.  - I have counseled him on diet  and weight management  by adopting a carbohydrate restricted/protein rich diet. Patient is encouraged to switch to  unprocessed or minimally processed     complex starch and increased protein intake (animal or plant source), fruits, and vegetables. -  he is advised to stick to a routine mealtimes to eat 3 meals  a day and avoid unnecessary snacks ( to snack only to correct hypoglycemia).    - he acknowledges that there is a room for improvement in his food and drink choices. - Suggestion is made for him to avoid simple carbohydrates  from his diet including Cakes, Sweet Desserts, Ice Cream, Soda (diet and regular), Sweet Tea, Candies, Chips, Cookies, Store Bought Juices, Alcohol , Artificial Sweeteners,  Coffee Creamer, and "Sugar-free" Products, Lemonade. This will help patient to have more stable blood glucose profile and potentially avoid unintended weight gain.    - he has been scheduled with Norm Salt, RDN, CDE for diabetes education.  - I have approached him with the following individualized plan to manage  his diabetes and patient agrees:   -He did not engage optimally with lifestyle medicine.  In light of his current and prevailing hyperglycemic burden, he will continue to need multiple daily injections of insulin in order for him to achieve and maintain control of diabetes to target.     In light of his current and prevailing glycemic burden, he will continue to require multiple daily injections of insulin in order for him to control diabetes to target.  He did not follow recommendations for intensive basal/bolus insulin regimen.  He did relatively better on premixed insulin.  He did not bring his meter for verification of his glycemic profile records.  He is advised to resume and continue NovoLog 70/30 70 units with breakfast  and 50 units with supper for Premeal blood glucose readings above 90 mg per DL.  He is encouraged to continue monitoring blood glucose continuously, call 4 times a day-for meals and at bedtime and return in 15 days with his meter for reevaluation.   - he is warned not to take insulin without proper monitoring per orders. - he is encouraged to call clinic for blood glucose levels less than 70 or above 300 mg /dl. -He is advised to continue metformin  500 mg p.o. twice daily-daily after breakfast and after supper. -He is advised to continue glipizide 5 mg XL p.o. daily at breakfast.    - he is not a candidate for incretin therapy due to his current heavy smoking, at risk for pancreatitis.    - Specific targets for  A1c;  LDL, HDL,  and Triglycerides were discussed with the patient.  2) Blood Pressure /Hypertension:  -His blood pressure is marginal at 76/54 sitting, 68/50 standing.  He is symptomatic of orthostatic hypotension.  He  is sent to ER for better evaluation and treatment.   After his stabilization, he will need work-up including lab work for adrenal sufficiency as well as cardiac function sufficiency.   3) Lipids/Hyperlipidemia:   Review of his recent lipid panel showed un controlled LDL 181.  He has not been consistent taking his statin.  He is advised to resume and continue his atorvastatin 80 mg p.o. nightly.      Side effects and precautions discussed with him.  4)  Weight/Diet:  Body mass index is 22.41 kg/m.  -  he is not a candidate for major weight loss. I discussed with him the fact that loss of 5 - 10% of his  current body weight will have the most impact on his diabetes management.  Exercise, and detailed carbohydrates information provided  -  detailed on discharge instructions.  5) Chronic Care/Health Maintenance:  -he  is on Statin medications and  is encouraged to initiate and continue to follow up with Ophthalmology, Dentist,  Podiatrist at least yearly or according to  recommendations, and advised to  quit smoking. I have recommended yearly flu vaccine and pneumonia vaccine at least every 5 years; moderate intensity exercise for up to 150 minutes weekly; and  sleep for at least 7 hours a day.   The patient was counseled on the dangers of tobacco use, and was advised to quit.  Reviewed strategies to maximize success, including removing cigarettes and smoking materials from environment.  He had normal ABI on May 08, 2020.  This study will be repeated in December 2026, or sooner if needed.  - he is  advised to maintain close follow up with Jacquelin HawkingMcElroy, Shannon, PA-C for primary care needs, as well as his other providers for optimal and coordinated care.   I spent 35 minutes in the care of the patient today including review of labs from CMP, Lipids, Thyroid Function, Hematology (current and previous including abstractions from other facilities); face-to-face time discussing  his blood glucose readings/logs, discussing hypoglycemia and hyperglycemia episodes and symptoms, medications doses, his options of short and long term treatment based on the latest standards of care / guidelines;  discussion about incorporating lifestyle medicine;  and documenting the encounter. Risk reduction counseling performed per USPSTF guidelines to reduce  and cardiovascular risk factors.     Please refer to Patient Instructions for Blood Glucose Monitoring and Insulin/Medications Dosing Guide"  in media tab for additional information. Please  also refer to " Patient Self Inventory" in the Media  tab for reviewed elements of pertinent patient history.  Westyn L Holzmann participated in the discussions, expressed understanding, and voiced agreement with the above plans.  All questions were answered to his satisfaction. he is encouraged to contact clinic should he have any questions or concerns prior to his return visit.     Follow up plan: Return in 7 days for reevaluation. Marquis LunchGebre Destenie Ingber,  MD [facility] [address] [place], [place] [address] Phone: [phone]  Fax: [phone]    01/18/2022, 1:35 PM  This note was partially dictated with voice recognition software. Similar sounding words can be transcribed inadequately or may not  be corrected upon review.

## 2022-01-18 NOTE — ED Triage Notes (Addendum)
Pt sent from [facility]. Pt was there for f/u for hyperglycemia- however, he had symptomatic hypotension and was sent here.   BP at Dr. Isidoro Donning office:  76/54 sitting 68/50 standing  Pt has felt like he is going to pass out- has not today, but had 2 syncopal episodes yesterday. Fell, hit head, no thinners. +LOC

## 2022-01-18 NOTE — Discharge Instructions (Addendum)
Today you were seen in the emergency department for your low blood pressure and dizziness.    In the emergency department you had lab work and EKG that was reassuring.  He also had a head CT that did not show evidence of head injury.   It is likely that your symptoms are from severe dehydration as well as orthostasis.  Please talk to your cardiologist about if this could be due to an autoregulatory disorder or other conditions such as POTS.  Discuss your medications with your primary doctor including trazodone and other medications that may cause orthostatic hypotension.  At home, please stay very well-hydrated.   Follow-up with your primary doctor in 2-3 days regarding your visit.  Cardiology will contact you about follow-up.  Return immediately to the emergency department if you experience any of the following: Recurrent dizziness, chest pain, fainting, difficulty breathing, or any other concerning symptoms.    Thank you for visiting our Emergency Department. It was a pleasure taking care of you today.

## 2022-01-19 NOTE — Congregational Nurse Program (Addendum)
  Dept: [phone]   Congregational Nurse Program Note  Date of Encounter: 01/19/2022  Past Medical History: Past Medical History:  Diagnosis Date   Anxiety    Arthritis    Asthma    CAD (coronary artery disease)    Moderate LAD disease 2016 - Dr. 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   Type 2 diabetes mellitus ([place])     Encounter Details:  CNP Questionnaire - 01/19/22 1531       Questionnaire   Do you give verbal consent to treat you today? Yes    Location Patient [place] or Organization    Patient Status Unknown    Insurance Uninsured ([employer]/Self-Pay)    Insurance Referral N/A    Medication N/A    Medical Provider Yes    Screening Referrals N/A    Medical Referral N/A    Medical Appointment Made N/A    Food Have Food Insecurities;Referred to Food Pantry    Transportation N/A    Housing/Utilities N/A    Chiropractor N/A    Intervention Support;Educate;Case Management;Navigate Healthcare System;Advocate    ED Visit Averted N/A    Life-Saving Intervention Made N/A            Client into [employer] today to shop at our [employer]. While in food market met with client. He states he went to his endocrine appointment yesterday and his blood pressure was systolic in 41D and they sent him to the Emergency room.  Client was treated for dehydration at [place] with orthostatic hypotension. Client is convinced he was diagnosed with POTS yesterday. Discussed that client would need more definitive specific testing. He states he has read up on it and he has  symptoms and he would need a cardiologist. Reinforced recommendations from his Primary care provider regarding staying hydrated and drinking at least 6 "[employer] cups" per day or more of water . I attempted to explain the role of uncontrolled high blood sugars and causing dehydration and how that can also contribute to symptoms of dizziness, lightheadedness. Client continued to discuss POTS is what I have.  I discussed that his consistent higher blood sugars are affecting his kidneys, his circulation, also contributing to his slower digestion which can cause the nausea and that better control would help him feel better.  Discussed with client to eat smaller meals to allow time for his stomach to empty properly and to avoid breads and pastas. Encouraged client to take his insulin and other medications as directed. Several times I tried to discuss the importance of proper hydration.  Client's appearance disheveled . He reports he is still going to [facility] for treatment of depression. He has had increased weight loss over the past several months.  Will continue to follow for support.   [employer]

## 2022-01-21 ENCOUNTER — Other Ambulatory Visit: Payer: Self-pay | Admitting: *Deleted

## 2022-01-21 ENCOUNTER — Ambulatory Visit (INDEPENDENT_AMBULATORY_CARE_PROVIDER_SITE_OTHER): Payer: Self-pay | Admitting: Cardiology

## 2022-01-21 ENCOUNTER — Encounter: Payer: Self-pay | Admitting: Cardiology

## 2022-01-21 VITALS — BP 125/84

## 2022-01-21 DIAGNOSIS — I951 Orthostatic hypotension: Secondary | ICD-10-CM

## 2022-01-21 DIAGNOSIS — I2510 Atherosclerotic heart disease of native coronary artery without angina pectoris: Secondary | ICD-10-CM

## 2022-01-21 MED ORDER — MIDODRINE HCL 5 MG PO TABS: 5.0000 mg | ORAL_TABLET | Freq: Two times a day (BID) | ORAL | 1 refills | Status: DC

## 2022-01-21 NOTE — Progress Notes (Signed)
Cardiology Office Note  Date: 01/21/2022   ID: [name] [name], DOB 1970/08/10, MRN [id number]  PCP:  [name], PA-C  Cardiologist:  Nona Dell, MD Electrophysiologist:  None   Chief Complaint  Patient presents with   Cardiac follow-up    History of Present Illness: [name] [name] is a 51 y.o. male last seen in September 2022 by Ms. Lilla Shook (I last saw him in 2020).  Record review finds recent ER encounter on August 21, sent from PCP office with hypotension and lightheadedness.  Reportedly, had been experiencing nausea and emesis during the week prior, also loose stools.  No acute finding by lab work from a cardiac perspective.  He was given 2 L of fluid, found to be orthostatic by measurements.  He is here today for follow-up.  States that he has had trouble with symptomatic orthostasis for quite some time.  Reports no longer taking either Lopressor or Imdur.  He has type 2 diabetes mellitus with peripheral neuropathy and most likely significant autonomic dysfunction.  He did show orthostatic blood pressure changes today on examination.  Past Medical History:  Diagnosis Date   Anxiety    Arthritis    Asthma    CAD (coronary artery disease)    Moderate LAD disease 2016 - Dr. 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, bilateral    Sleep apnea 2016   Type 2 diabetes mellitus (HCC)     Past Surgical History:  Procedure Laterality Date   ANKLE SURGERY Right 1982   "had extra bones in there; took them out"   APPENDECTOMY  1975   BIOPSY  12/26/2018   Procedure: BIOPSY;  Surgeon: Malissa Hippo, MD;  [facility];  Service: Endoscopy;;  duodenal biopsies   BIOPSY   03/23/2019   Procedure: BIOPSY;  Surgeon: Malissa Hippo, MD;  [facility];  Service: Endoscopy;;  esophagus   CARDIAC CATHETERIZATION N/A 03/28/2015   Procedure: Left Heart Cath and Coronary Angiography;  Surgeon: Yates Decamp, MD;  Location: [facility] INVASIVE CV LAB;  Service: Cardiovascular;  Laterality: N/A;   CARDIAC CATHETERIZATION N/A 03/28/2015   Procedure: Intravascular Pressure Wire/FFR Study;  Surgeon: Yates Decamp, MD;  Location: [facility] INVASIVE CV LAB;  Service: Cardiovascular;  Laterality: N/A;   CARPAL TUNNEL RELEASE Left ~ 2008   COLONOSCOPY WITH ESOPHAGOGASTRODUODENOSCOPY (EGD)     COLONOSCOPY WITH PROPOFOL N/A 12/24/2019   Procedure: COLONOSCOPY WITH PROPOFOL;  Surgeon: Malissa Hippo, MD;  [facility];  Service: Endoscopy;  Laterality: N/A;  955   ELBOW FRACTURE SURGERY Left ~ 2008   ESOPHAGEAL MANOMETRY N/A 09/16/2021   Procedure: ESOPHAGEAL MANOMETRY (EM);  Surgeon: Shellia Cleverly, DO;  Location: [facility];  Service: Endoscopy;  Laterality: N/A;   ESOPHAGOGASTRODUODENOSCOPY N/A 12/26/2018   Procedure: ESOPHAGOGASTRODUODENOSCOPY (EGD);  Surgeon: Malissa Hippo, MD;  [facility];  Service: Endoscopy;  Laterality: N/A;   ESOPHAGOGASTRODUODENOSCOPY (EGD) WITH PROPOFOL N/A 03/23/2019   Procedure: ESOPHAGOGASTRODUODENOSCOPY (EGD) WITH PROPOFOL;  Surgeon: Malissa Hippo, MD;  [facility];  Service: Endoscopy;  Laterality: N/A;  7:30  FRACTURE SURGERY     ankle and elbow   PH IMPEDANCE STUDY N/A 09/16/2021   Procedure: PH IMPEDANCE STUDY;  Surgeon: Shellia Cleverly, DO;  [facility];  Service: Endoscopy;  Laterality: N/A;   PILONIDAL CYST EXCISION N/A 03/24/2017   Procedure: EXCISION CHRONIC  PILONIDAL ABSCESS;  Surgeon: [name] Miyamoto, MD;  Location: [facility];  Service: General;  Laterality: N/A;   SCROTAL EXPLORATION N/A 11/13/2020   Procedure: EXCISION OF SEBACEOUS CYSTS, SCROTUM;  Surgeon: Malen Gauze, MD;   Location: [facility];  Service: Urology;  Laterality: N/A;   TENDON REPAIR Left ~ 2004   "main tendon in my ankle"    Current Outpatient Medications  Medication Sig Dispense Refill   acetaminophen (TYLENOL) 325 MG tablet Take 650 mg by mouth every 6 (six) hours as needed for moderate pain.     albuterol (VENTOLIN HFA) 108 (90 Base) MCG/ACT inhaler Inhale 2 puffs into the lungs every 6 (six) hours as needed for wheezing or shortness of breath.     ARIPiprazole (ABILIFY) 10 MG tablet Take 10 mg by mouth daily.     aspirin EC 81 MG tablet Take 1 tablet by mouth daily with breakfast.     atorvastatin (LIPITOR) 80 MG tablet Take 1 tablet (80 mg total) by mouth daily. 90 tablet 0   famotidine (PEPCID) 40 MG tablet Take 1 tablet (40 mg total) by mouth 2 (two) times daily. 60 tablet 3   gabapentin (NEURONTIN) 300 MG capsule Take 1 capsule (300 mg total) by mouth every 8 (eight) hours as needed. 30 capsule 0   insulin aspart protamine - aspart (NOVOLOG MIX 70/30 FLEXPEN) (70-30) 100 UNIT/ML FlexPen 70 units with breakfast and 60 units with supper 30 mL 2   metFORMIN (GLUCOPHAGE) 500 MG tablet TAKE 1 Tablet  BY MOUTH TWICE DAILY WITH A MEAL 180 tablet 0   midodrine (PROAMATINE) 5 MG tablet Take 1 tablet (5 mg total) by mouth 2 (two) times daily with a meal. 180 tablet 1   nitroGLYCERIN (NITROSTAT) 0.4 MG SL tablet Place 1 tablet (0.4 mg total) under the tongue every 5 (five) minutes as needed for chest pain. 25 tablet 3   ondansetron (ZOFRAN) 4 MG tablet Take 4 mg by mouth as needed.     sertraline (ZOLOFT) 100 MG tablet Take 1 tablet by mouth daily.     traZODone (DESYREL) 50 MG tablet Take 50 mg by mouth at bedtime.     polyethylene glycol (MIRALAX / GLYCOLAX) 17 g packet Take 17 g by mouth daily. (Patient taking differently: Take 17 g by mouth daily as needed.) 14 each 0   No current facility-administered medications for this visit.   Allergies:  Omeprazole magnesium, Bee venom, Esomeprazole, and  Morphine and related   ROS: No chest pain.  Physical Exam: VS:  BP 125/84 (BP Location: Left Arm, Cuff Size: Normal)   SpO2 97% , BMI There is no height or weight on file to calculate BMI.  Wt Readings from Last 3 Encounters:  01/18/22 147 lb 6.4 oz (66.9 kg)  01/18/22 147 lb 6.4 oz (66.9 kg)  01/12/22 148 lb 11.2 oz (67.4 kg)    General: Patient appears comfortable at rest. HEENT: Conjunctiva and lids normal. Lungs: Clear to auscultation, nonlabored breathing at rest. Cardiac: Regular rate and rhythm, no S3 or significant systolic murmur, no pericardial rub. Extremities: No pitting edema.  ECG:  An ECG dated 01/18/2022 was personally reviewed today and demonstrated:  Sinus tachycardia  with right bundle branch block and left anterior fascicular block.  Recent Labwork: 12/01/2021: B Natriuretic Peptide 158.0 12/13/2021: TSH 3.482 01/09/2022: ALT 16; AST 12 01/18/2022: BUN 31; Creatinine, Ser 1.16; Hemoglobin 15.5; Magnesium 1.9; Platelets 340; Potassium 4.8; Sodium 136     Component Value Date/Time   CHOL 259 (H) 09/15/2021 1013   TRIG 187 (H) 09/15/2021 1013   HDL 41 09/15/2021 1013   CHOLHDL 6.3 09/15/2021 1013   VLDL 37 09/15/2021 1013   LDLCALC 181 (H) 09/15/2021 1013   LDLDIRECT 181.3 (H) 11/03/2020 1139    Other Studies Reviewed Today:  Lexiscan Myoview 01/13/2021: No diagnostic ST segment changes to indicate ischemia. Small, mild intensity, apical inferior defect that is partially reversible and consistent with a mild ischemic territory. This is a low risk study. Nuclear stress EF: 57%.  Chest CTA 12/01/2021: IMPRESSION: 1. No pulmonary embolism. 2. Lungs are clear. 3. Diffuse thickening of the distal third of the esophagus, which can be seen in the setting of GERD.  Head CT 01/18/2022: IMPRESSION: No evidence of acute intracranial abnormality.  Assessment and Plan:  1.  Orthostatic hypotension, most likely related to autonomic dysfunction in the setting of  diabetic neuropathy.  Likely exacerbated also by relative dehydration at times.  We discussed maintaining an adequate hydration status.  Also trying to keep good control of his glucose with PCP.  We will start midodrine 5 mg twice daily and titrate as needed.  2.  History of moderate nonobstructive CAD as of 2016.  No active angina at this time.  No longer on Lopressor or Imdur.  He has continued aspirin and Lipitor.  Medication Adjustments/Labs and Tests Ordered: Current medicines are reviewed at length with the patient today.  Concerns regarding medicines are outlined above.   Tests Ordered: No orders of the defined types were placed in this encounter.   Medication Changes: Meds ordered this encounter  Medications   midodrine (PROAMATINE) 5 MG tablet    Sig: Take 1 tablet (5 mg total) by mouth 2 (two) times daily with a meal.    Dispense:  180 tablet    Refill:  1    01/21/2022 NEW    Disposition:  Follow up  3 months.  Signed, Jonelle Sidle, MD, [facility] 01/21/2022 2:26 PM    [facility] at [facility] [address] [place], [place], [place] [address] Phone: [phone]; Fax: [phone]

## 2022-01-21 NOTE — Patient Instructions (Addendum)
Medication Instructions:  Your physician has recommended you make the following change in your medication:  Start midodrine 5 mg twice daily Continue other medications the same  Labwork: none  Testing/Procedures: none  Follow-Up: Your physician recommends that you schedule a follow-up appointment in: 3 months at the [place] office.  Any Other Special Instructions Will Be Listed Below (If Applicable).  If you need a refill on your cardiac medications before your next appointment, please call your pharmacy.

## 2022-01-22 ENCOUNTER — Telehealth: Payer: Self-pay | Admitting: Cardiology

## 2022-01-22 NOTE — Telephone Encounter (Signed)
Patient stated that medication is going to cost him $99.42. Pt states that he has no insurance and is going to the [employer] [place] and cannot afford medication at this time.   Please advise.

## 2022-01-22 NOTE — Telephone Encounter (Signed)
Pt c/o medication issue:  1. Name of Medication: midodrine (PROAMATINE) 5 MG tablet  2. How are you currently taking this medication (dosage and times per day)? 1 tablet twice a day  3. Are you having a reaction (difficulty breathing--STAT)? no  4. What is your medication issue? Patient states the medication is too expensive.

## 2022-01-25 NOTE — Telephone Encounter (Signed)
Jonelle Sidle, MD:    Maybe you could reach out to the free clinic and see if there is some assistance that we could provide for him.  His degree of orthostatic hypotension is most likely going to require medications to make a difference.  Hydrating well is an option and even using compression socks, but I suspect he will need midodrine.  You could check on the cost for Florinef also, but this may not be as effective as midodrine.

## 2022-01-25 NOTE — Telephone Encounter (Signed)
Spoke to [place] who stated that she has a coupon for Midodrine at the office for $30/monthly. Will call pt and see if this is feasible.

## 2022-01-25 NOTE — Telephone Encounter (Signed)
Spoke to pt about coupon being held at the free clinic for him. Pt stated that it would help. Patient had no other questions or concerns at this time. Will fwd to provider as FYI.

## 2022-01-25 NOTE — Telephone Encounter (Signed)
Spoke to [place] at the [facility] who stated that she will look in to this matter to see if there was any way they can assist the pt in getting medication. Carollee Herter stated that she will give me a call back. Provided direct line for her to reach.

## 2022-01-27 ENCOUNTER — Encounter: Payer: Self-pay | Admitting: Physician Assistant

## 2022-01-27 ENCOUNTER — Ambulatory Visit: Payer: Self-pay | Admitting: Physician Assistant

## 2022-01-27 VITALS — BP 124/86 | HR 93 | Temp 97.3°F | Ht 68.0 in | Wt 146.0 lb

## 2022-01-27 DIAGNOSIS — E1165 Type 2 diabetes mellitus with hyperglycemia: Secondary | ICD-10-CM

## 2022-01-27 DIAGNOSIS — I959 Hypotension, unspecified: Secondary | ICD-10-CM

## 2022-01-27 LAB — GLUCOSE, POCT (MANUAL RESULT ENTRY): POC Glucose: 112 mg/dl — AB (ref 70–99)

## 2022-01-27 NOTE — Progress Notes (Signed)
BP 124/86   Pulse 93   Temp (!) 97.3 F (36.3 C)   Ht 5\' 8"  (1.727 m)   Wt 146 lb (66.2 kg)   SpO2 99%   BMI 22.20 kg/m    Subjective:    Patient ID: [name] [name], male    DOB: 26-Jan-1971, 51 y.o.   MRN: [id number]  HPI: [name] [name] is a 51 y.o. male presenting on 01/27/2022 for Dehydration (/) and Hypertension   HPI  He hasn't started the midodrine yet that was prescribed by cardiology at his appointment last week.   He was seen by endocrinologist last week as well and has appointment to follow up there next week.     Pt says FBS 124 this morning.      Relevant past medical, surgical, family and social history reviewed and updated as indicated. Interim medical history since our last visit reviewed. Allergies and medications reviewed and updated.    Current Outpatient Medications:    acetaminophen (TYLENOL) 325 MG tablet, Take 650 mg by mouth every 6 (six) hours as needed for moderate pain., Disp: , Rfl:    albuterol (VENTOLIN HFA) 108 (90 Base) MCG/ACT inhaler, Inhale 2 puffs into the lungs every 6 (six) hours as needed for wheezing or shortness of breath., Disp: , Rfl:    ARIPiprazole (ABILIFY) 10 MG tablet, Take 10 mg by mouth daily., Disp: , Rfl:    aspirin EC 81 MG tablet, Take 1 tablet by mouth daily with breakfast., Disp: , Rfl:    atorvastatin (LIPITOR) 80 MG tablet, Take 1 tablet (80 mg total) by mouth daily., Disp: 90 tablet, Rfl: 0   famotidine (PEPCID) 40 MG tablet, Take 1 tablet (40 mg total) by mouth 2 (two) times daily., Disp: 60 tablet, Rfl: 3   gabapentin (NEURONTIN) 300 MG capsule, Take 1 capsule (300 mg total) by mouth every 8 (eight) hours as needed., Disp: 30 capsule, Rfl: 0   insulin aspart protamine - aspart (NOVOLOG MIX 70/30 FLEXPEN) (70-30) 100 UNIT/ML FlexPen, 70 units with breakfast and 60 units with supper, Disp: 30 mL, Rfl: 2   metFORMIN (GLUCOPHAGE) 500 MG tablet, TAKE 1 Tablet  BY MOUTH TWICE DAILY WITH A MEAL, Disp: 180 tablet, Rfl:  0   nitroGLYCERIN (NITROSTAT) 0.4 MG SL tablet, Place 1 tablet (0.4 mg total) under the tongue every 5 (five) minutes as needed for chest pain., Disp: 25 tablet, Rfl: 3   ondansetron (ZOFRAN) 4 MG tablet, Take 4 mg by mouth as needed., Disp: , Rfl:    polyethylene glycol (MIRALAX / GLYCOLAX) 17 g packet, Take 17 g by mouth daily. (Patient taking differently: Take 17 g by mouth daily as needed.), Disp: 14 each, Rfl: 0   sertraline (ZOLOFT) 100 MG tablet, Take 1 tablet by mouth daily., Disp: , Rfl:    traZODone (DESYREL) 50 MG tablet, Take 50 mg by mouth at bedtime., Disp: , Rfl:    midodrine (PROAMATINE) 5 MG tablet, Take 1 tablet (5 mg total) by mouth 2 (two) times daily with a meal. (Patient not taking: Reported on 01/27/2022), Disp: 180 tablet, Rfl: 1   Review of Systems  Per HPI unless specifically indicated above     Objective:    BP 124/86   Pulse 93   Temp (!) 97.3 F (36.3 C)   Ht 5\' 8"  (1.727 m)   Wt 146 lb (66.2 kg)   SpO2 99%   BMI 22.20 kg/m   Wt Readings from Last 3 Encounters:  01/27/22 146 lb (66.2 kg)  01/18/22 147 lb 6.4 oz (66.9 kg)  01/18/22 147 lb 6.4 oz (66.9 kg)    Physical Exam Vitals reviewed.  Constitutional:      General: He is not in acute distress.    Appearance: He is not toxic-appearing.  HENT:     Head: Normocephalic and atraumatic.  Cardiovascular:     Rate and Rhythm: Normal rate and regular rhythm.  Pulmonary:     Effort: Pulmonary effort is normal.     Breath sounds: Normal breath sounds. No wheezing.  Abdominal:     General: Bowel sounds are normal.     Palpations: Abdomen is soft.     Tenderness: There is no abdominal tenderness.  Musculoskeletal:     Cervical back: Neck supple.  Lymphadenopathy:     Cervical: No cervical adenopathy.  Skin:    General: Skin is warm and dry.  Neurological:     Mental Status: He is alert and oriented to person, place, and time.  Psychiatric:        Behavior: Behavior normal.             Assessment & Plan:    Encounter Diagnoses  Name Primary?   Uncontrolled type 2 diabetes mellitus with hyperglycemia (HCC) Yes   Hypotension, unspecified hypotension type        -pt was Given coupon for Midodrine for [facility]- $27-$33 -pt to follow up with cardiology and endocrinology per their recommendations.  He is reminded to take his bs log and bs meter with him to endocrinology appointment.   He is encouraged to drink plenty of water -pt to follow up 3 months.  He is to RTO sooner prn

## 2022-02-02 ENCOUNTER — Encounter: Payer: Self-pay | Admitting: "Endocrinology

## 2022-02-02 ENCOUNTER — Telehealth: Payer: Self-pay | Admitting: Cardiology

## 2022-02-02 ENCOUNTER — Ambulatory Visit (INDEPENDENT_AMBULATORY_CARE_PROVIDER_SITE_OTHER): Payer: Self-pay | Admitting: "Endocrinology

## 2022-02-02 VITALS — BP 122/78 | HR 88 | Ht 68.0 in | Wt 147.8 lb

## 2022-02-02 DIAGNOSIS — E782 Mixed hyperlipidemia: Secondary | ICD-10-CM

## 2022-02-02 DIAGNOSIS — Z713 Dietary counseling and surveillance: Secondary | ICD-10-CM

## 2022-02-02 DIAGNOSIS — F17200 Nicotine dependence, unspecified, uncomplicated: Secondary | ICD-10-CM

## 2022-02-02 DIAGNOSIS — Z91199 Patient's noncompliance with other medical treatment and regimen due to unspecified reason: Secondary | ICD-10-CM

## 2022-02-02 DIAGNOSIS — E1165 Type 2 diabetes mellitus with hyperglycemia: Secondary | ICD-10-CM

## 2022-02-02 MED ORDER — MIDODRINE HCL 5 MG PO TABS: 5.0000 mg | ORAL_TABLET | Freq: Two times a day (BID) | ORAL | 1 refills | Status: DC

## 2022-02-02 MED ORDER — DEXCOM G7 SENSOR MISC: 2 refills | Status: DC

## 2022-02-02 MED ORDER — DEXCOM G7 RECEIVER DEVI: 0 refills | Status: DC

## 2022-02-02 MED ORDER — NOVOLOG MIX 70/30 FLEXPEN (70-30) 100 UNIT/ML ~~LOC~~ SUPN
PEN_INJECTOR | SUBCUTANEOUS | 2 refills | Status: DC
Start: 2022-02-02 — End: 2022-02-23

## 2022-02-02 NOTE — Telephone Encounter (Signed)
Patient has asked that prescription for Midodrine 60 tablets 5mg  be sent to [facility] instead. Originally it was sent to [facility].

## 2022-02-02 NOTE — Patient Instructions (Signed)

## 2022-02-02 NOTE — Progress Notes (Signed)
02/02/2022, 5:18 PM  Endocrinology follow-up note   Subjective:    Patient ID: [name] [name], male    DOB: 10/11/1970.  [name] [name] is being seen in follow-up after he was seen in consultation for management of currently uncontrolled symptomatic diabetes requested by  [name], PA-C.   Past Medical History:  Diagnosis Date   Anxiety    Arthritis    Asthma    CAD (coronary artery disease)    Moderate LAD disease 2016 - Dr. 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, bilateral    Sleep apnea 2016   Type 2 diabetes mellitus (HCC)     Past Surgical History:  Procedure Laterality Date   ANKLE SURGERY Right 1982   "had extra bones in there; took them out"   APPENDECTOMY  1975   BIOPSY  12/26/2018   Procedure: BIOPSY;  Surgeon: Malissa Hippo, MD;  [facility];  Service: Endoscopy;;  duodenal biopsies   BIOPSY  03/23/2019   Procedure: BIOPSY;  Surgeon: Malissa Hippo, MD;  [facility]: [facility];  Service: Endoscopy;;  esophagus   CARDIAC CATHETERIZATION N/A 03/28/2015   Procedure: Left Heart Cath and Coronary Angiography;  Surgeon: Yates Decamp, MD;  Location: [facility] INVASIVE CV LAB;  Service: Cardiovascular;  Laterality: N/A;   CARDIAC CATHETERIZATION N/A 03/28/2015   Procedure: Intravascular Pressure Wire/FFR Study;  Surgeon: Yates Decamp, MD;  Location: [facility] INVASIVE CV LAB;  Service: Cardiovascular;  Laterality: N/A;   CARPAL TUNNEL RELEASE Left ~ 2008   COLONOSCOPY WITH ESOPHAGOGASTRODUODENOSCOPY (EGD)     COLONOSCOPY WITH PROPOFOL N/A 12/24/2019   Procedure: COLONOSCOPY WITH PROPOFOL;  Surgeon: Malissa Hippo, MD;  [facility];  Service: Endoscopy;  Laterality: N/A;  955   ELBOW FRACTURE SURGERY Left ~ 2008   ESOPHAGEAL MANOMETRY N/A 09/16/2021   Procedure: ESOPHAGEAL MANOMETRY (EM);  Surgeon: Shellia Cleverly, DO;  Location: [facility];  Service: Endoscopy;  Laterality: N/A;   ESOPHAGOGASTRODUODENOSCOPY N/A 12/26/2018   Procedure: ESOPHAGOGASTRODUODENOSCOPY (EGD);  Surgeon: Malissa Hippo, MD;  [facility];  Service: Endoscopy;  Laterality: N/A;   ESOPHAGOGASTRODUODENOSCOPY (EGD) WITH PROPOFOL N/A 03/23/2019   Procedure: ESOPHAGOGASTRODUODENOSCOPY (EGD) WITH PROPOFOL;  Surgeon: Malissa Hippo, MD;  [facility];  Service: Endoscopy;  Laterality: N/A;  7:30   FRACTURE SURGERY     ankle and elbow   PH IMPEDANCE STUDY N/A 09/16/2021   Procedure: PH IMPEDANCE STUDY;  Surgeon: Shellia Cleverly, DO;  Location: [facility];  Service: Endoscopy;  Laterality: N/A;   PILONIDAL CYST EXCISION N/A 03/24/2017   Procedure: EXCISION CHRONIC  PILONIDAL ABSCESS;  Surgeon: [name] Miyamoto, MD;  Location: [facility];  Service: General;  Laterality: N/A;   SCROTAL EXPLORATION N/A 11/13/2020   Procedure:  EXCISION OF SEBACEOUS CYSTS, SCROTUM;  Surgeon: Malen Gauze, MD;  Location: [facility];  Service: Urology;  Laterality: N/A;   TENDON REPAIR Left ~ 2004   "main tendon in my ankle"    Social History   Socioeconomic History   Marital status: Single    Spouse name: Not on file   Number of children: 0   Years of education: 10th g   Highest education level: Not on file  Occupational History   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  Tobacco Use   Smoking status: Every Day    Packs/day: 0.50    Years: 34.00    Total pack years: 17.00    Types: Cigarettes    Passive exposure: Never   Smokeless tobacco: Never   Tobacco comments:    1/2 pack a day  Vaping Use   Vaping Use: Never used  Substance and Sexual Activity   Alcohol use: Never    Alcohol/week: 0.0  standard drinks of alcohol   Drug use: Never   Sexual activity: Not Currently  Other Topics Concern   Not on file  Social History Narrative   His main caretaker, his mother has terminal metastatic cancer.   Social Determinants of Health   Financial Resource Strain: Not on file  Food Insecurity: Not on file  Transportation Needs: Not on file  Physical Activity: Not on file  Stress: Not on file  Social Connections: Not on file    Family History  Problem Relation Age of Onset   Diabetes Mother    Hypertension Mother    Cancer Mother    Throat cancer Mother    Diabetes Father    Hypertension Father    Hyperlipidemia Father    Congestive Heart Failure Sister    Colon cancer Neg Hx    Pancreatic cancer Neg Hx    Liver disease Neg Hx     Outpatient Encounter Medications as of 02/02/2022  Medication Sig   Continuous Blood Gluc Receiver (DEXCOM G7 RECEIVER) DEVI Use to monitor BG continuously   Continuous Blood Gluc Sensor (DEXCOM G7 SENSOR) MISC Change sensor every 10 days   acetaminophen (TYLENOL) 325 MG tablet Take 650 mg by mouth every 6 (six) hours as needed for moderate pain.   albuterol (VENTOLIN HFA) 108 (90 Base) MCG/ACT inhaler Inhale 2 puffs into the lungs every 6 (six) hours as needed for wheezing or shortness of breath.   ARIPiprazole (ABILIFY) 10 MG tablet Take 10 mg by mouth daily.   aspirin EC 81 MG tablet Take 1 tablet by mouth daily with breakfast.   atorvastatin (LIPITOR) 80 MG tablet Take 1 tablet (80 mg total) by mouth daily.   famotidine (PEPCID) 40 MG tablet Take 1 tablet (40 mg total) by mouth 2 (two) times daily.   gabapentin (NEURONTIN) 300 MG capsule Take 1 capsule (300 mg total) by mouth every 8 (eight) hours as needed.   insulin aspart protamine - aspart (NOVOLOG MIX 70/30 FLEXPEN) (70-30) 100 UNIT/ML FlexPen 70 units with breakfast and 50 units with supper   metFORMIN (GLUCOPHAGE) 500 MG tablet TAKE 1 Tablet  BY MOUTH TWICE DAILY WITH A MEAL    nitroGLYCERIN (NITROSTAT) 0.4 MG SL tablet Place 1 tablet (0.4 mg total) under the tongue every 5 (five) minutes as needed for chest pain.   ondansetron (ZOFRAN) 4 MG tablet Take 4 mg by mouth as needed.   polyethylene glycol (MIRALAX / GLYCOLAX) 17 g packet Take 17 g by mouth daily. (Patient taking differently:  Take 17 g by mouth daily as needed.)   sertraline (ZOLOFT) 100 MG tablet Take 1 tablet by mouth daily.   traZODone (DESYREL) 50 MG tablet Take 50 mg by mouth at bedtime.   [DISCONTINUED] insulin aspart protamine - aspart (NOVOLOG MIX 70/30 FLEXPEN) (70-30) 100 UNIT/ML FlexPen 70 units with breakfast and 60 units with supper   [DISCONTINUED] midodrine (PROAMATINE) 5 MG tablet Take 1 tablet (5 mg total) by mouth 2 (two) times daily with a meal. (Patient not taking: Reported on 01/27/2022)   No facility-administered encounter medications on file as of 02/02/2022.    ALLERGIES: Allergies  Allergen Reactions   Omeprazole Magnesium Swelling    Face swells, no breathing impairment   Bee Venom    Esomeprazole Swelling    Face swells, no breathing impairment   Morphine And Related     Itching at IV site, dry heaving, burning sensation in throat.    VACCINATION STATUS: Immunization History  Administered Date(s) Administered   Influenza-Unspecified 03/13/2019   Moderna Sars-Covid-2 Vaccination 09/11/2019, 10/11/2019   Pneumococcal Polysaccharide-23 05/25/2017    Diabetes He presents for his follow-up diabetic visit. He has type 2 diabetes mellitus. Onset time: He was diagnosed at approximate age of 39 years. His disease course has been worsening. There are no hypoglycemic associated symptoms. Pertinent negatives for hypoglycemia include no confusion, headaches, pallor or seizures. Associated symptoms include blurred vision, fatigue, polydipsia, polyuria and weakness. Pertinent negatives for diabetes include no chest pain and no polyphagia. There are no hypoglycemic complications. Symptoms  are worsening. Diabetic complications include heart disease. Risk factors for coronary artery disease include diabetes mellitus, dyslipidemia, male sex, hypertension, family history, tobacco exposure and sedentary lifestyle. Current diabetic treatment includes insulin injections. His weight is increasing steadily. He is following a generally unhealthy diet. When asked about meal planning, he reported none. He rarely participates in exercise. His home blood glucose trend is increasing steadily. His overall blood glucose range is >200 mg/dl. Gosselin presents with his meter and only 6 readings in the last 15 days, averaging 329 for the last 7 days.  His most recent A1c was 13.7%.  He remains unengaged for proper monitoring of blood glucose properly.   He was supposed to be on NovoLog 70/30 70 units with breakfast and 50 units at supper.   His compliance is always questionable. He did not document any hypoglycemia.) An ACE inhibitor/angiotensin II receptor blocker is being taken. He does not see a podiatrist.Eye exam is not current.  Hyperlipidemia This is a chronic problem. The current episode started more than 1 year ago. The problem is uncontrolled. Exacerbating diseases include diabetes. Pertinent negatives include no chest pain, myalgias or shortness of breath. Risk factors for coronary artery disease include dyslipidemia, diabetes mellitus, family history, male sex, hypertension and a sedentary lifestyle.  Hypertension This is a chronic problem. The current episode started more than 1 year ago. The problem is controlled. Associated symptoms include blurred vision. Pertinent negatives include no chest pain, headaches, neck pain, palpitations or shortness of breath. Risk factors for coronary artery disease include diabetes mellitus, dyslipidemia, family history, male gender, sedentary lifestyle and smoking/tobacco exposure. Past treatments include direct vasodilators. Hypertensive end-organ damage includes  CAD/MI.     Review of Systems  Constitutional:  Positive for fatigue. Negative for chills, fever and unexpected weight change.  HENT:  Negative for dental problem, mouth sores and trouble swallowing.   Eyes:  Positive for blurred vision. Negative for visual disturbance.  Respiratory:  Negative for  cough, choking, chest tightness, shortness of breath and wheezing.   Cardiovascular:  Negative for chest pain, palpitations and leg swelling.  Gastrointestinal:  Negative for abdominal distention, abdominal pain, constipation, diarrhea, nausea and vomiting.  Endocrine: Positive for polydipsia and polyuria. Negative for polyphagia.  Genitourinary:  Negative for dysuria, flank pain, hematuria and urgency.  Musculoskeletal:  Negative for back pain, gait problem, myalgias and neck pain.  Skin:  Negative for pallor, rash and wound.  Neurological:  Positive for weakness. Negative for seizures, syncope, numbness and headaches.  Psychiatric/Behavioral:  Negative for confusion and dysphoric mood.     Objective:       02/02/2022   10:38 AM 01/27/2022    9:17 AM 01/21/2022    1:48 PM  Vitals with BMI  Height     Weight 147 lbs 13 oz 146 lbs   BMI 22.48 22.2   Systolic 122 124 782  Diastolic 78 86 84  Pulse 88 93     BP 122/78   Pulse 88   Ht  (1.727 m)   Wt 147 lb 12.8 oz (67 kg)   BMI 22.47 kg/m   Wt Readings from Last 3 Encounters:  02/02/22 147 lb 12.8 oz (67 kg)  01/27/22 146 lb (66.2 kg)  01/18/22 147 lb 6.4 oz (66.9 kg)    Reluctant affect   CMP     Component Value Date/Time   NA 136 01/18/2022 1231   K 4.8 01/18/2022 1231   CL 103 01/18/2022 1231   CO2 24 01/18/2022 1231   GLUCOSE 111 (H) 01/18/2022 1231   BUN 31 (H) 01/18/2022 1231   CREATININE 1.16 01/18/2022 1231   CREATININE 1.05 01/23/2019 0927   CALCIUM 8.6 (L) 01/18/2022 1231   PROT 6.5 01/09/2022 2000   ALBUMIN 3.2 (L) 01/09/2022 2000   AST 12 (L) 01/09/2022 2000   ALT 16 01/09/2022 2000    ALKPHOS 87 01/09/2022 2000   BILITOT 0.9 01/09/2022 2000   GFRNONAA >60 01/18/2022 1231   GFRNONAA 83 01/23/2019 0927   GFRAA >60 01/11/2020 0859   GFRAA 96 01/23/2019 0927     Diabetic Labs (most recent): Lab Results  Component Value Date   HGBA1C 13.7 (H) 12/13/2021   HGBA1C 13.2 (A) 09/08/2021   HGBA1C 12.5 (A) 06/10/2021   MICROALBUR 990.3 (H) 09/15/2021   MICROALBUR 811.7 (H) 04/07/2020   MICROALBUR 121.6 (H) 04/24/2019     Lipid Panel ( most recent) Lipid Panel     Component Value Date/Time   CHOL 259 (H) 09/15/2021 1013   TRIG 187 (H) 09/15/2021 1013   HDL 41 09/15/2021 1013   CHOLHDL 6.3 09/15/2021 1013   VLDL 37 09/15/2021 1013   LDLCALC 181 (H) 09/15/2021 1013   LDLDIRECT 181.3 (H) 11/03/2020 1139      Lab Results  Component Value Date   TSH 3.482 12/13/2021   TSH 1.772 05/24/2017   TSH 4.778 (H) 11/25/2015     Assessment & Plan:   1. Uncontrolled type 2 diabetes mellitus with hyperglycemia (HCC)   - Keiton L Doutt has currently uncontrolled symptomatic type 2 DM since  51 years of age. Recent labs reviewed.  Torrez presents with his meter and only 6 readings in the last 15 days.  His most recent A1c was 13.7%.  He remains unengaged for proper monitoring of blood glucose properly.   He was supposed to be on NovoLog 70/30 70 units with breakfast and 50 units at supper.   His compliance  is always questionable. He did not document any hypoglycemia.   - I had a long discussion with him about the progressive nature of diabetes and the pathology behind its complications. -his diabetes is complicated by coronary artery disease and he remains at a high risk for more acute and chronic complications which include CAD, CVA, CKD, retinopathy, and neuropathy. These are all discussed in detail with him.  - I have counseled him on diet  and weight management  by adopting a carbohydrate restricted/protein rich diet. Patient is encouraged to switch to  unprocessed or  minimally processed     complex starch and increased protein intake (animal or plant source), fruits, and vegetables. -  he is advised to stick to a routine mealtimes to eat 3 meals  a day and avoid unnecessary snacks ( to snack only to correct hypoglycemia).   - he acknowledges that there is a room for improvement in his food and drink choices. - Suggestion is made for him to avoid simple carbohydrates  from his diet including Cakes, Sweet Desserts, Ice Cream, Soda (diet and regular), Sweet Tea, Candies, Chips, Cookies, Store Bought Juices, Alcohol , Artificial Sweeteners,  Coffee Creamer, and "Sugar-free" Products, Lemonade. This will help patient to have more stable blood glucose profile and potentially avoid unintended weight gain.   - he has been scheduled with Norm Salt, RDN, CDE for diabetes education.  - I have approached him with the following individualized plan to manage  his diabetes and patient agrees:   -He remains unengaged for proper monitoring of blood glucose for optimal use of insulin.  In light of his current and prevailing glycemic burden, he will continue to require multiple daily injections of insulin in order for him to control diabetes to target.  He did not follow recommendations for intensive basal/bolus insulin regimen.   He will be continued on his premixed insulin NovoLog 70/30 70 units with breakfast and 50 units with supper only when his Premeal blood glucose readings above 90 mg per DL. He is approached again to monitor blood glucose 4 times a day-before meals and at bedtime. This patient would have benefited from a CGM, however unfortunately at this time he does not have health insurance.    - he is warned not to take insulin without proper monitoring per orders. - he is encouraged to call clinic for blood glucose levels less than 70 or above 300 mg /dl. -He is advised to continue metformin  500 mg p.o. twice daily-daily after breakfast and after  supper. -He is advised to continue glipizide 5 mg XL p.o. daily at breakfast.    - he is not a candidate for incretin therapy due to his current heavy smoking, at risk for pancreatitis.    - Specific targets for  A1c;  LDL, HDL,  and Triglycerides were discussed with the patient.  2) Blood Pressure /Hypertension:  -His blood pressure is controlled to target at 122/78 today.     3) Lipids/Hyperlipidemia:   Review of his recent lipid panel showed un controlled LDL 181.  He has not been consistent taking his statin.  He is advised to resume and continue his atorvastatin 80 mg p.o. nightly.      Side effects and precautions discussed with him.  Cold foods plant-based diet was discussed with him.  4)  Weight/Diet:  Body mass index is 22.47 kg/m.  -  he is not a candidate for major weight loss. I discussed with him the fact that loss  of 5 - 10% of his  current body weight will have the most impact on his diabetes management.  Exercise, and detailed carbohydrates information provided  -  detailed on discharge instructions.  5) Chronic Care/Health Maintenance:  -he  is on Statin medications and  is encouraged to initiate and continue to follow up with Ophthalmology, Dentist,  Podiatrist at least yearly or according to recommendations, and advised to  quit smoking. I have recommended yearly flu vaccine and pneumonia vaccine at least every 5 years; moderate intensity exercise for up to 150 minutes weekly; and  sleep for at least 7 hours a day.                                                                               Advice for Weight Management  -For most of [place] the best way to lose weight is by diet management. Generally speaking, diet management means consuming less calories intentionally which over time brings about progressive weight loss.  This can be achieved more effectively by avoiding ultra processed carbohydrates, processed meats, unhealthy fats.    It is critically important to know  your numbers: how much calorie you are consuming and how much calorie you need. More importantly, our carbohydrates sources should be unprocessed naturally occurring  complex starch food items.  It is always important to balance nutrition also by  appropriate intake of proteins (mainly plant-based), healthy fats/oils, plenty of fruits and vegetables.   -The American College of Lifestyle Medicine (ACL M) recommends nutrition derived mostly from Whole Food, Plant Predominant Sources example an apple instead of applesauce or apple pie. Eat Plenty of vegetables, Mushrooms, fruits, Legumes, Whole Grains, Nuts, seeds in lieu of processed meats, processed snacks/pastries red meat, poultry, eggs.  Use only water or unsweetened tea for hydration.  The College also recommends the need to stay away from risky substances including alcohol, smoking; obtaining 7-9 hours of restorative sleep, at least 150 minutes of moderate intensity exercise weekly, importance of healthy social connections, and being mindful of stress and seek help when it is overwhelming.    -Sticking to a routine mealtime to eat 3 meals a day and avoiding unnecessary snacks is shown to have a big role in weight control. Under normal circumstances, the only time we burn stored energy is when we are hungry, so allow  some hunger to take place- hunger means no food between appropriate meal times, only water.  It is not advisable to starve.   -It is better to avoid simple carbohydrates including: Cakes, Sweet Desserts, Ice Cream, Soda (diet and regular), Sweet Tea, Candies, Chips, Cookies, Store Bought Juices, Alcohol in Excess of  1-2 drinks a day, Lemonade,  Artificial Sweeteners, Doughnuts, Coffee Creamers, "Sugar-free" Products, etc, etc.  This is not a complete list...Marland Kitchen.    -Consulting with certified diabetes educators is proven to provide you with the most accurate and current information on diet.  Also, you may be  interested in discussing diet  options/exchanges , we can schedule a visit with Norm Salt, RDN, CDE for individualized nutrition education.  -Exercise: If you are able: 30 -60 minutes a day ,4 days a week, or 150 minutes of moderate intensity  exercise weekly.    The longer the better if tolerated.  Combine stretch, strength, and aerobic activities.  If you were told in the past that you have high risk for cardiovascular diseases, or if you are currently symptomatic, you may seek evaluation by your heart doctor prior to initiating moderate to intense exercise programs.                                  Additional Care Considerations for Diabetes/Prediabetes   -Diabetes  is a chronic disease.  The most important care consideration is regular follow-up with your diabetes care provider with the goal being avoiding or delaying its complications and to take advantage of advances in medications and technology.  If appropriate actions are taken early enough, type 2 diabetes can even be reversed.  Seek information from the right source.  - Whole Food, Plant Predominant Nutrition is highly recommended: Eat Plenty of vegetables, Mushrooms, fruits, Legumes, Whole Grains, Nuts, seeds in lieu of processed meats, processed snacks/pastries red meat, poultry, eggs as recommended by [employer] of  Lifestyle Medicine (ACLM).  -Type 2 diabetes is known to coexist with other important comorbidities such as high blood pressure and high cholesterol.  It is critical to control not only the diabetes but also the high blood pressure and high cholesterol to minimize and delay the risk of complications including coronary artery disease, stroke, amputations, blindness, etc.  The good news is that this diet recommendation for type 2 diabetes is also very helpful for managing high cholesterol and high blood blood pressure.  - Studies showed that people with diabetes will benefit from a class of medications known as ACE inhibitors and statins.  Unless  there are specific reasons not to be on these medications, the standard of care is to consider getting one from these groups of medications at an optimal doses.  These medications are generally considered safe and proven to help protect the heart and the kidneys.    - People with diabetes are encouraged to initiate and maintain regular follow-up with eye doctors, foot doctors, dentists , and if necessary heart and kidney doctors.     - It is highly recommended that people with diabetes quit smoking or stay away from smoking, and get yearly  flu vaccine and pneumonia vaccine at least every 5 years.  See above for additional recommendations on exercise, sleep, stress management , and healthy social connections.  The patient was counseled on the dangers of tobacco use, and was advised to quit.  Reviewed strategies to maximize success, including removing cigarettes and smoking materials from environment.    He had normal ABI on May 08, 2020.  This study will be repeated in December 2026, or sooner if needed.  - he is  advised to maintain close follow up with [name], PA-C for primary care needs, as well as his other providers for optimal and coordinated care.    I spent 32 minutes in the care of the patient today including review of labs from CMP, Lipids, Thyroid Function, Hematology (current and previous including abstractions from other facilities); face-to-face time discussing  his blood glucose readings/logs, discussing hypoglycemia and hyperglycemia episodes and symptoms, medications doses, his options of short and long term treatment based on the latest standards of care / guidelines;  discussion about incorporating lifestyle medicine;  and documenting the encounter. Risk reduction counseling performed per USPSTF guidelines to reduce  cardiovascular  risk factors.     Please refer to Patient Instructions for Blood Glucose Monitoring and Insulin/Medications Dosing Guide"  in media  tab for additional information. Please  also refer to " Patient Self Inventory" in the Media  tab for reviewed elements of pertinent patient history.  Daimion L Benko participated in the discussions, expressed understanding, and voiced agreement with the above plans.  All questions were answered to his satisfaction. he is encouraged to contact clinic should he have any questions or concerns prior to his return visit.      Follow up plan: Return in 7 days for reevaluation. Marquis LunchGebre Webb Weed, MD [facility] [address] [place], [place] [address] Phone: [phone]  Fax: [phone]    02/02/2022, 5:18 PM  This note was partially dictated with voice recognition software. Similar sounding words can be transcribed inadequately or may not  be corrected upon review.

## 2022-02-02 NOTE — Telephone Encounter (Signed)
Refill complete 

## 2022-02-04 ENCOUNTER — Ambulatory Visit: Payer: Self-pay | Admitting: Physician Assistant

## 2022-02-18 ENCOUNTER — Telehealth (INDEPENDENT_AMBULATORY_CARE_PROVIDER_SITE_OTHER): Payer: Self-pay | Admitting: *Deleted

## 2022-02-18 NOTE — Telephone Encounter (Signed)
Spoke to schedule regarding referral sent on 01/11/22 - they have left multiple message for patient (01/22/22, 01/27/22 & 02/18/22) to call and schedule office apt - scheduler asked if I would reach out to patient too and give him the number to call - I called patient's cell# and left detailed message with phone# to call, I called home# and attempted to leave voice  mail but phone cut off  # for patient to call @ [facility]: [phone] opt 1

## 2022-02-21 ENCOUNTER — Emergency Department (HOSPITAL_COMMUNITY)
Admission: EM | Admit: 2022-02-21 | Discharge: 2022-02-21 | Disposition: A | Discharge status: Home / Self Care | Payer: Medicaid Other | Attending: Emergency Medicine | Admitting: Emergency Medicine

## 2022-02-21 ENCOUNTER — Encounter (HOSPITAL_COMMUNITY): Payer: Self-pay | Admitting: Emergency Medicine

## 2022-02-21 ENCOUNTER — Other Ambulatory Visit: Payer: Self-pay

## 2022-02-21 DIAGNOSIS — E1165 Type 2 diabetes mellitus with hyperglycemia: Secondary | ICD-10-CM | POA: Insufficient documentation

## 2022-02-21 DIAGNOSIS — R000 Tachycardia, unspecified: Secondary | ICD-10-CM | POA: Insufficient documentation

## 2022-02-21 DIAGNOSIS — E875 Hyperkalemia: Secondary | ICD-10-CM | POA: Insufficient documentation

## 2022-02-21 DIAGNOSIS — R739 Hyperglycemia, unspecified: Secondary | ICD-10-CM

## 2022-02-21 DIAGNOSIS — Z7984 Long term (current) use of oral hypoglycemic drugs: Secondary | ICD-10-CM | POA: Insufficient documentation

## 2022-02-21 DIAGNOSIS — Z7982 Long term (current) use of aspirin: Secondary | ICD-10-CM | POA: Insufficient documentation

## 2022-02-21 DIAGNOSIS — Z794 Long term (current) use of insulin: Secondary | ICD-10-CM | POA: Insufficient documentation

## 2022-02-21 DIAGNOSIS — I951 Orthostatic hypotension: Secondary | ICD-10-CM

## 2022-02-21 LAB — COMPREHENSIVE METABOLIC PANEL
ALT: 12 U/L (ref 0–44)
AST: 13 U/L — ABNORMAL LOW (ref 15–41)
Albumin: 3.4 g/dL — ABNORMAL LOW (ref 3.5–5.0)
Alkaline Phosphatase: 89 U/L (ref 38–126)
Anion gap: 10 (ref 5–15)
BUN: 20 mg/dL (ref 6–20)
CO2: 22 mmol/L (ref 22–32)
Calcium: 8.5 mg/dL — ABNORMAL LOW (ref 8.9–10.3)
Chloride: 101 mmol/L (ref 98–111)
Creatinine, Ser: 1.22 mg/dL (ref 0.61–1.24)
GFR, Estimated: >60 mL/min (ref >60)
Glucose, Bld: 348 mg/dL — ABNORMAL HIGH (ref 70–99)
Potassium: 5.5 mmol/L — ABNORMAL HIGH (ref 3.5–5.1)
Sodium: 133 mmol/L — ABNORMAL LOW (ref 135–145)
Total Bilirubin: 0.6 mg/dL (ref 0.3–1.2)
Total Protein: 6.5 g/dL (ref 6.5–8.1)

## 2022-02-21 LAB — CBC WITH DIFFERENTIAL/PLATELET
Abs Immature Granulocytes: 0.04 10*3/uL (ref 0.00–0.07)
Basophils Absolute: 0.1 10*3/uL (ref 0.0–0.1)
Basophils Relative: 1 %
Eosinophils Absolute: 0.3 10*3/uL (ref 0.0–0.5)
Eosinophils Relative: 2 %
HCT: 44.5 % (ref 39.0–52.0)
Hemoglobin: 15.3 g/dL (ref 13.0–17.0)
Immature Granulocytes: 0 %
Lymphocytes Relative: 29 %
Lymphs Abs: 3 10*3/uL (ref 0.7–4.0)
MCH: 31.4 pg (ref 26.0–34.0)
MCHC: 34.4 g/dL (ref 30.0–36.0)
MCV: 91.4 fL (ref 80.0–100.0)
Monocytes Absolute: 0.5 10*3/uL (ref 0.1–1.0)
Monocytes Relative: 5 %
Neutro Abs: 6.6 10*3/uL (ref 1.7–7.7)
Neutrophils Relative %: 63 %
Platelets: 255 10*3/uL (ref 150–400)
RBC: 4.87 MIL/uL (ref 4.22–5.81)
RDW: 12.3 % (ref 11.5–15.5)
WBC: 10.4 10*3/uL (ref 4.0–10.5)
nRBC: 0 % (ref 0.0–0.2)

## 2022-02-21 LAB — CBG MONITORING, ED: Glucose-Capillary: 279 mg/dL — ABNORMAL HIGH (ref 70–99)

## 2022-02-21 MED ORDER — SODIUM CHLORIDE 0.9 % IV BOLUS
1000.0000 mL | Freq: Once | INTRAVENOUS | Status: AC
  Administered 2022-02-21: 1000 mL via INTRAVENOUS

## 2022-02-21 MED ORDER — ACETAMINOPHEN 325 MG PO TABS
650.0000 mg | ORAL_TABLET | Freq: Once | ORAL | Status: AC
  Administered 2022-02-21: 650 mg via ORAL
  Filled 2022-02-21: qty 2

## 2022-02-21 NOTE — ED Notes (Signed)
Patient verbalizes understanding of discharge instructions. Opportunity for questioning and answers were provided. Armband removed by staff, pt discharged from ED. Ambulated out to lobby  

## 2022-02-21 NOTE — ED Provider Notes (Signed)
[facility] Provider Note   CSN: [id number] Arrival date & time: 02/21/22  1649     History  Chief Complaint  Patient presents with   Hypotension    [name] [name] is a 51 y.o. male.  Patient with history of type 2 diabetes, orthostatic hypotension thought related to autonomic dysfunction from diabetes, on midodrine --presents to the emergency department today for evaluation of lightheadedness and dizziness.  No full syncope.  He states that he can only walk a short distance before having to stop because he feels like he is going to pass out.  This is obviously very frustrating to him.  Reports that he has to sit in a chair while taking a shower to prevent severe dizziness.  He was started on midodrine by cardiologist at the end of August.  States that he has been taking this, last dose this morning, and has not helped.  States that he is drinking plenty of fluids and denies any recent nausea, vomiting, diarrhea or decreased oral intake for any reason.  No new or changing headaches.  No fevers or URI symptoms.  No chest pain or shortness of breath.  Patient has been seen in the emergency department multiple times for this problem.  He states that he is usually treated with IV fluids but voices frustration that nothing has been done to help his problems that has worked.       Home Medications Prior to Admission medications   Medication Sig Start Date End Date Taking? Authorizing Provider  acetaminophen (TYLENOL) 325 MG tablet Take 650 mg by mouth every 6 (six) hours as needed for moderate pain.    [provider]  albuterol (VENTOLIN HFA) 108 (90 Base) MCG/ACT inhaler Inhale 2 puffs into the lungs every 6 (six) hours as needed for wheezing or shortness of breath.    [provider]  ARIPiprazole (ABILIFY) 10 MG tablet Take 10 mg by mouth daily.    [provider]  aspirin EC 81 MG tablet Take 1 tablet by mouth daily with breakfast. 02/14/21    [provider]  atorvastatin (LIPITOR) 80 MG tablet Take 1 tablet (80 mg total) by mouth daily. 12/31/21   Soyla Dryer, PA-C  Continuous Blood Gluc Receiver (DEXCOM G7 RECEIVER) DEVI Use to monitor BG continuously 02/02/22   Cassandria Anger, MD  Continuous Blood Gluc Sensor (DEXCOM G7 SENSOR) MISC Change sensor every 10 days 02/02/22   Cassandria Anger, MD  famotidine (PEPCID) 40 MG tablet Take 1 tablet (40 mg total) by mouth 2 (two) times daily. 05/26/21   Soyla Dryer, PA-C  gabapentin (NEURONTIN) 300 MG capsule Take 1 capsule (300 mg total) by mouth every 8 (eight) hours as needed. 10/22/21 01/22/23  Wyvonnia Dusky, MD  insulin aspart protamine - aspart (NOVOLOG MIX 70/30 FLEXPEN) (70-30) 100 UNIT/ML FlexPen 70 units with breakfast and 50 units with supper 02/02/22   Cassandria Anger, MD  metFORMIN (GLUCOPHAGE) 500 MG tablet TAKE 1 Tablet  BY MOUTH TWICE DAILY WITH A MEAL 10/28/21   Nida, Marella Chimes, MD  midodrine (PROAMATINE) 5 MG tablet Take 1 tablet (5 mg total) by mouth 2 (two) times daily with a meal. 02/02/22   Satira Sark, MD  nitroGLYCERIN (NITROSTAT) 0.4 MG SL tablet Place 1 tablet (0.4 mg total) under the tongue every 5 (five) minutes as needed for chest pain. 10/05/17   Soyla Dryer, PA-C  ondansetron (ZOFRAN) 4 MG tablet Take 4 mg by mouth  as needed.    [provider]  polyethylene glycol (MIRALAX / GLYCOLAX) 17 g packet Take 17 g by mouth daily. Patient taking differently: Take 17 g by mouth daily as needed. 11/11/20   Gilda Crease, MD  sertraline (ZOLOFT) 100 MG tablet Take 1 tablet by mouth daily. 01/09/20   [provider]  traZODone (DESYREL) 50 MG tablet Take 50 mg by mouth at bedtime. 10/03/20   [provider]      Allergies    Omeprazole magnesium, Bee venom, Esomeprazole, and Morphine and related    Review of Systems   Review of Systems  Physical Exam Updated Vital Signs BP 98/71 (BP Location:  Right Arm)   Pulse (!) 111   Temp 99.3 F (37.4 C) (Oral)   Resp 17   Ht 5\' 8"  (1.727 m)   Wt 67 kg   SpO2 100%   BMI 22.47 kg/m   Physical Exam Vitals and nursing note reviewed.  Constitutional:      Appearance: He is well-developed. He is not diaphoretic.  HENT:     Head: Normocephalic and atraumatic.     Mouth/Throat:     Mouth: Mucous membranes are not dry.  Eyes:     Conjunctiva/sclera: Conjunctivae normal.  Neck:     Vascular: Normal carotid pulses. No carotid bruit or JVD.     Trachea: Trachea normal. No tracheal deviation.  Cardiovascular:     Rate and Rhythm: Regular rhythm. Tachycardia present.     Pulses: No decreased pulses.          Radial pulses are 2+ on the right side and 2+ on the left side.     Heart sounds: Normal heart sounds, S1 normal and S2 normal. Heart sounds not distant. No murmur heard. Pulmonary:     Effort: Pulmonary effort is normal. No respiratory distress.     Breath sounds: Normal breath sounds. No wheezing.  Chest:     Chest wall: No tenderness.  Abdominal:     General: Bowel sounds are normal.     Palpations: Abdomen is soft.     Tenderness: There is no abdominal tenderness. There is no guarding or rebound.  Musculoskeletal:     Cervical back: Normal range of motion and neck supple. No muscular tenderness.     Right lower leg: No edema.     Left lower leg: No edema.  Skin:    General: Skin is warm and dry.     Coloration: Skin is not pale.  Neurological:     Mental Status: He is alert. Mental status is at baseline.  Psychiatric:        Mood and Affect: Mood normal.    ED Results / Procedures / Treatments   Labs (all labs ordered are listed, but only abnormal results are displayed) Labs Reviewed  COMPREHENSIVE METABOLIC PANEL - Abnormal; Notable for the following components:      Result Value   Sodium 133 (*)    Potassium 5.5 (*)    Glucose, Bld 348 (*)    Calcium 8.5 (*)    Albumin 3.4 (*)    AST 13 (*)    All other  components within normal limits  CBG MONITORING, ED - Abnormal; Notable for the following components:   Glucose-Capillary 279 (*)    All other components within normal limits  CBC WITH DIFFERENTIAL/PLATELET    ED ECG REPORT   Date: 02/21/2022  Rate: 100  Rhythm: normal sinus rhythm  QRS Axis:  left  Intervals: normal  ST/T Wave abnormalities: normal  Conduction Disutrbances:right bundle 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   Radiology No results found.  Procedures Procedures    Medications Ordered in ED Medications  sodium chloride 0.9 % bolus 1,000 mL (0 mLs Intravenous Stopped 02/21/22 1843)  acetaminophen (TYLENOL) tablet 650 mg (650 mg Oral Given 02/21/22 1757)  sodium chloride 0.9 % bolus 1,000 mL (0 mLs Intravenous Stopped 02/21/22 2002)    ED Course/ Medical Decision Making/ A&P    Patient seen and examined. History obtained directly from patient.   Labs/EKG: Ordered CBC, CMP, EKG.  Also orthostatic vital signs.  Imaging: None ordered  Medications/Fluids: IV fluid bolus.  Most recent vital signs reviewed and are as follows: BP 98/71 (BP Location: Right Arm)   Pulse (!) 111   Temp 99.3 F (37.4 C) (Oral)   Resp 17   Ht 5\' 8"  (1.727 m)   Wt 67 kg   SpO2 100%   BMI 22.47 kg/m   Initial impression: Orthostatic hypotension.  7:13 PM EKG personally reviewed and interpreted as above.  Orthostatics reviewed.  Orthostatic VS for the past 24 hrs:  BP- Lying Pulse- Lying BP- Sitting Pulse- Sitting BP- Standing at 0 minutes Pulse- Standing at 0 minutes  02/21/22 1833 (!) 161/102 100 (!) 149/106 100 120/85 107   9:12 PM Reassessment performed. Patient appears stable.  Blood pressures higher, now while lying.  Labs personally reviewed and interpreted including: CBC  unremarkable; CMP potassium 5.5, glucose 348, normal anion gap and CMP not consistent with DKA; glucose down to 279 after fluids.  Imaging personally visualized and interpreted including: None ordered  Reviewed pertinent lab work and imaging with patient at bedside. Questions answered.   Most current vital signs reviewed and are as follows: BP (!) 165/97   Pulse 99   Temp 99.3 F (37.4 C) (Oral)   Resp 16   Ht 5\' 8"  (1.727 m)   Wt 67 kg   SpO2 100%   BMI 22.47 kg/m   Plan: Discharge to home.   Prescriptions written for: None ordered  Other home care instructions discussed: Continue good hydration, he may increase midodrine to 10 mg if desired.  He states that he may increase this during one of the doses per day.  ED return instructions discussed: Return with syncope, chest pain, shortness of breath, new symptoms or other concerns.  Follow-up instructions discussed: Patient encouraged to follow-up with their PCP in 3 days.                            Medical Decision Making Amount and/or Complexity of Data Reviewed Labs: ordered.  Risk OTC drugs.    Patient with ongoing problems with orthostatic hypotension.  He has seen cardiology for this.  His blood sugars continue to be very poorly controlled, into the 300s today.  Suspect element of dehydration, despite polydipsia.  Patient improved with IV fluids.  Potassium was a bit elevated, but normal kidney function.  He EKG is stable without findings which could be contributed to hyperkalemia.  The patient's vital signs, pertinent lab work and imaging were reviewed and interpreted as discussed in the ED course. Hospitalization was considered for further testing, treatments, or serial exams/observation. However as patient is  well-appearing, has a stable exam, and reassuring studies today, I do not feel that they warrant admission at this time. This plan was discussed with the patient who verbalizes agreement and comfort with this  plan and seems reliable and able to return to the Emergency Department with worsening or changing symptoms.          Final Clinical Impression(s) / ED Diagnoses Final diagnoses:  Hyperglycemia  Orthostatic hypotension    Rx / DC Orders ED Discharge Orders     None         Renne Crigler, Cordelia Poche 02/21/22 2114    Maia Plan, MD 02/22/22 2010

## 2022-02-21 NOTE — Discharge Instructions (Addendum)
Please read and follow all provided instructions.  Your diagnoses today include:  1. Hyperglycemia   2. Orthostatic hypotension    Tests performed today include: Your blood cell counts today were okay Your blood sugar was elevated Vital signs. See below for your results today.   Medications prescribed:  None  Take any prescribed medications only as directed.  Home care instructions:  Follow any educational materials contained in this packet.  Continue to take the midodrine.  You may increase this to 10 mg, if you feel you are having more significant lightheadedness from low blood pressure.  Please make sure you are taking this at least 4 hours prior to bedtime.  Follow-up instructions: Please follow-up with your primary care provider in the next 3 days for further evaluation of your symptoms.   Return instructions:  Please return to the Emergency Department if you experience worsening symptoms.  Please return if you have any other emergent concerns.  Additional Information:  Your vital signs today were: BP (!) 165/97   Pulse 99   Temp 99.3 F (37.4 C) (Oral)   Resp 16   Ht 5\' 8"  (1.727 m)   Wt 67 kg   SpO2 100%   BMI 22.47 kg/m  If your blood pressure (BP) was elevated above 135/85 this visit, please have this repeated by your doctor within one month. --------------

## 2022-02-21 NOTE — ED Triage Notes (Signed)
Patient to he ED with complaints of orthostatic hypotension.

## 2022-02-23 ENCOUNTER — Ambulatory Visit (INDEPENDENT_AMBULATORY_CARE_PROVIDER_SITE_OTHER): Payer: Self-pay | Admitting: "Endocrinology

## 2022-02-23 ENCOUNTER — Encounter: Payer: Self-pay | Admitting: "Endocrinology

## 2022-02-23 VITALS — BP 108/62 | HR 108 | Ht 68.0 in | Wt 150.8 lb

## 2022-02-23 DIAGNOSIS — I10 Essential (primary) hypertension: Secondary | ICD-10-CM

## 2022-02-23 DIAGNOSIS — E1165 Type 2 diabetes mellitus with hyperglycemia: Secondary | ICD-10-CM

## 2022-02-23 DIAGNOSIS — E782 Mixed hyperlipidemia: Secondary | ICD-10-CM

## 2022-02-23 DIAGNOSIS — Z91199 Patient's noncompliance with other medical treatment and regimen due to unspecified reason: Secondary | ICD-10-CM

## 2022-02-23 DIAGNOSIS — F17200 Nicotine dependence, unspecified, uncomplicated: Secondary | ICD-10-CM

## 2022-02-23 DIAGNOSIS — Z6822 Body mass index (BMI) 22.0-22.9, adult: Secondary | ICD-10-CM

## 2022-02-23 MED ORDER — NOVOLOG MIX 70/30 FLEXPEN (70-30) 100 UNIT/ML ~~LOC~~ SUPN
PEN_INJECTOR | SUBCUTANEOUS | 2 refills | Status: DC
Start: 2022-02-23 — End: 2022-08-30

## 2022-02-23 NOTE — Progress Notes (Signed)
02/23/2022, 12:52 PM  Endocrinology follow-up note   Subjective:    Patient ID: [name] [name], male    DOB: 04/11/1971.  [name] [name] is being seen in follow-up after he was seen in consultation for management of currently uncontrolled symptomatic diabetes requested by  [name], PA-C.   Past Medical History:  Diagnosis Date   Anxiety    Arthritis    Asthma    CAD (coronary artery disease)    Moderate LAD disease 2016 - Dr. 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, bilateral    Sleep apnea 2016   Type 2 diabetes mellitus (HCC)     Past Surgical History:  Procedure Laterality Date   ANKLE SURGERY Right 1982   "had extra bones in there; took them out"   APPENDECTOMY  1975   BIOPSY  12/26/2018   Procedure: BIOPSY;  Surgeon: Malissa Hippo, MD;  [facility];  Service: Endoscopy;;  duodenal biopsies   BIOPSY  03/23/2019   Procedure: BIOPSY;  Surgeon: Malissa Hippo, MD;  [facility];  Service: Endoscopy;;  esophagus   CARDIAC CATHETERIZATION N/A 03/28/2015   Procedure: Left Heart Cath and Coronary Angiography;  Surgeon: Yates Decamp, MD;  [facility] INVAS[facility] CV LAB;  Service: Cardiovascular;  Laterality: N/A;   CARDIAC CATHETERIZATION N/A 03/28/2015   Procedure: Intravascular Pressure Wire/FFR Study;  Surgeon: Yates Decamp, MD;  Location: [facility] INVASIVE CV LAB;  Service: Cardiovascular;  Laterality: N/A;   CARPAL TUNNEL RELEASE Left ~ 2008   COLONOSCOPY WITH ESOPHAGOGASTRODUODENOSCOPY (EGD)     COLONOSCOPY WITH PROPOFOL N/A 12/24/2019   Procedure: COLONOSCOPY WITH PROPOFOL;  Surgeon: Malissa Hippo, MD;  [facility];  Service: Endoscopy;  Laterality: N/A;  955   ELBOW FRACTURE SURGERY Left ~ 2008   ESOPHAGEAL MANOMETRY N/A 09/16/2021   Procedure: ESOPHAGEAL MANOMETRY (EM);  Surgeon: Shellia Cleverly, DO;  Location: [facility];  Service: Endoscopy;  Laterality: N/A;   ESOPHAGOGASTRODUODENOSCOPY N/A 12/26/2018   Procedure: ESOPHAGOGASTRODUODENOSCOPY (EGD);  Surgeon: Malissa Hippo, MD;  [facility];  Service: Endoscopy;  Laterality: N/A;   ESOPHAGOGASTRODUODENOSCOPY (EGD) WITH PROPOFOL N/A 03/23/2019   Procedure: ESOPHAGOGASTRODUODENOSCOPY (EGD) WITH PROPOFOL;  Surgeon: Malissa Hippo, MD;  [facility];  Service: Endoscopy;  Laterality: N/A;  7:30   FRACTURE SURGERY     ankle and elbow   PH IMPEDANCE STUDY N/A 09/16/2021   Procedure: PH IMPEDANCE STUDY;  Surgeon: Shellia Cleverly, DO;  Location: [facility];  Service: Endoscopy;  Laterality: N/A;   PILONIDAL CYST EXCISION N/A 03/24/2017   Procedure: EXCISION CHRONIC  PILONIDAL ABSCESS;  Surgeon: [name] Miyamoto, MD;  Location: [facility];  Service: General;  Laterality: N/A;   SCROTAL EXPLORATION N/A 11/13/2020   Procedure:  EXCISION OF SEBACEOUS CYSTS, SCROTUM;  Surgeon: Malen GauzeMcKenzie, Patrick L, MD;  Location: [facility];  Service: Urology;  Laterality: N/A;   TENDON REPAIR Left ~ 2004   "main tendon in my ankle"    Social History   Socioeconomic History   Marital status: Single    Spouse name: Not on file   Number of children: 0   Years of education: 10th g   Highest education level: Not on file  Occupational History   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  Tobacco Use   Smoking status: Every Day    Packs/day: 0.50    Years: 34.00    Total pack years: 17.00    Types: Cigarettes    Passive exposure: Never   Smokeless tobacco: Never   Tobacco comments:    1/2 pack a day  Vaping Use   Vaping Use: Never used  Substance and Sexual Activity   Alcohol use: Never    Alcohol/week: 0.0  standard drinks of alcohol   Drug use: Never   Sexual activity: Not Currently  Other Topics Concern   Not on file  Social History Narrative   His main caretaker, his mother has terminal metastatic cancer.   Social Determinants of Health   Financial Resource Strain: Not on file  Food Insecurity: Not on file  Transportation Needs: Not on file  Physical Activity: Not on file  Stress: Not on file  Social Connections: Not on file    Family History  Problem Relation Age of Onset   Diabetes Mother    Hypertension Mother    Cancer Mother    Throat cancer Mother    Diabetes Father    Hypertension Father    Hyperlipidemia Father    Congestive Heart Failure Sister    Colon cancer Neg Hx    Pancreatic cancer Neg Hx    Liver disease Neg Hx     Outpatient Encounter Medications as of 02/23/2022  Medication Sig   acetaminophen (TYLENOL) 500 MG tablet Take 1,500 mg by mouth in the morning and at bedtime.   albuterol (VENTOLIN HFA) 108 (90 Base) MCG/ACT inhaler Inhale 2 puffs into the lungs every 6 (six) hours as needed for wheezing or shortness of breath.   ARIPiprazole (ABILIFY) 10 MG tablet Take 10 mg by mouth daily.   aspirin EC 81 MG tablet Take 1 tablet by mouth daily with breakfast.   atorvastatin (LIPITOR) 80 MG tablet Take 1 tablet (80 mg total) by mouth daily.   Continuous Blood Gluc Receiver (DEXCOM G7 RECEIVER) DEVI Use to monitor BG continuously   Continuous Blood Gluc Sensor (DEXCOM G7 SENSOR) MISC Change sensor every 10 days   famotidine (PEPCID) 40 MG tablet Take 1 tablet (40 mg total) by mouth 2 (two) times daily.   gabapentin (NEURONTIN) 300 MG capsule Take 1 capsule (300 mg total) by mouth every 8 (eight) hours as needed.   insulin aspart protamine - aspart (NOVOLOG MIX 70/30 FLEXPEN) (70-30) 100 UNIT/ML FlexPen Inject 80 units with breakfast and 60 units with supper when pre-meal glucose is above 90   metFORMIN (GLUCOPHAGE) 500 MG tablet TAKE 1 Tablet  BY MOUTH TWICE  DAILY WITH A MEAL   midodrine (PROAMATINE) 5 MG tablet Take 1 tablet (5 mg total) by mouth 2 (two) times daily with a meal.   nitroGLYCERIN (NITROSTAT) 0.4 MG SL tablet Place 1 tablet (0.4 mg total) under the tongue every 5 (five) minutes as needed for chest pain.   ondansetron (ZOFRAN) 4 MG tablet  Take 4 mg by mouth as needed.   polyethylene glycol (MIRALAX / GLYCOLAX) 17 g packet Take 17 g by mouth daily. (Patient taking differently: Take 17 g by mouth daily as needed for moderate constipation.)   promethazine (PHENERGAN) 12.5 MG suppository Place 12.5 mg rectally every 6 (six) hours as needed for nausea or vomiting.   promethazine (PHENERGAN) 12.5 MG tablet Take 12.5 mg by mouth every 6 (six) hours as needed for nausea or vomiting.   sertraline (ZOLOFT) 100 MG tablet Take 1 tablet by mouth daily.   traZODone (DESYREL) 50 MG tablet Take 50 mg by mouth at bedtime.   [DISCONTINUED] insulin aspart protamine - aspart (NOVOLOG MIX 70/30 FLEXPEN) (70-30) 100 UNIT/ML FlexPen 70 units with breakfast and 50 units with supper   No facility-administered encounter medications on file as of 02/23/2022.    ALLERGIES: Allergies  Allergen Reactions   Omeprazole Magnesium Swelling    Face swells, no breathing impairment   Bee Venom    Esomeprazole Swelling    Face swells, no breathing impairment   Morphine And Related     Itching at IV site, dry heaving, burning sensation in throat.    VACCINATION STATUS: Immunization History  Administered Date(s) Administered   Influenza-Unspecified 03/13/2019   Moderna Sars-Covid-2 Vaccination 09/11/2019, 10/11/2019   Pneumococcal Polysaccharide-23 05/25/2017    Diabetes He presents for his follow-up diabetic visit. He has type 2 diabetes mellitus. Onset time: He was diagnosed at approximate age of 16 years. His disease course has been worsening. There are no hypoglycemic associated symptoms. Pertinent negatives for hypoglycemia include no confusion, headaches,  pallor or seizures. Associated symptoms include blurred vision, fatigue, polydipsia, polyuria and weakness. Pertinent negatives for diabetes include no chest pain and no polyphagia. There are no hypoglycemic complications. Symptoms are worsening. Diabetic complications include heart disease. Risk factors for coronary artery disease include diabetes mellitus, dyslipidemia, male sex, hypertension, family history, tobacco exposure and sedentary lifestyle. Current diabetic treatment includes insulin injections. His weight is increasing steadily. He is following a generally unhealthy diet. When asked about meal planning, he reported none. He rarely participates in exercise. His home blood glucose trend is increasing steadily. His overall blood glucose range is >200 mg/dl. [name] [name] presents with his meter with inadequate monitoring.  His average blood glucose is still significantly above target.  His average blood glucose is 432 for the last 7 days, 421 for the last  14 days.  His most recent A1c was 13.7%.  He remains unengaged for proper monitoring of blood glucose properly.     His compliance is always questionable. He did not document any hypoglycemia.) An ACE inhibitor/angiotensin II receptor blocker is being taken. He does not see a podiatrist.Eye exam is not current.  Hyperlipidemia This is a chronic problem. The current episode started more than 1 year ago. The problem is uncontrolled. Exacerbating diseases include diabetes. Pertinent negatives include no chest pain, myalgias or shortness of breath. Risk factors for coronary artery disease include dyslipidemia, diabetes mellitus, family history, male sex, hypertension and a sedentary lifestyle.  Hypertension This is a chronic problem. The current episode started more than 1 year ago. The problem is controlled. Associated symptoms include blurred vision. Pertinent negatives include no chest pain, headaches, neck pain, palpitations or shortness of breath. Risk  factors for coronary artery disease include diabetes mellitus, dyslipidemia, family history, male gender, sedentary lifestyle and smoking/tobacco exposure. Past treatments include direct vasodilators. Hypertensive end-organ damage includes CAD/MI.     Review of Systems  Constitutional:  Positive for fatigue. Negative for chills, fever and unexpected weight change.  HENT:  Negative for dental problem, mouth sores and trouble swallowing.   Eyes:  Positive for blurred vision. Negative for visual disturbance.  Respiratory:  Negative for cough, choking, chest tightness, shortness of breath and wheezing.   Cardiovascular:  Negative for chest pain, palpitations and leg swelling.  Gastrointestinal:  Negative for abdominal distention, abdominal pain, constipation, diarrhea, nausea and vomiting.  Endocrine: Positive for polydipsia and polyuria. Negative for polyphagia.  Genitourinary:  Negative for dysuria, flank pain, hematuria and urgency.  Musculoskeletal:  Negative for back pain, gait problem, myalgias and neck pain.  Skin:  Negative for pallor, rash and wound.  Neurological:  Positive for weakness. Negative for seizures, syncope, numbness and headaches.  Psychiatric/Behavioral:  Negative for confusion and dysphoric mood.     Objective:       02/23/2022    9:21 AM 02/21/2022    8:00 PM 02/21/2022    7:30 PM  Vitals with BMI  Height 5\' 8"     Weight 150 lbs 13 oz    BMI 22.93    Systolic 108 165  Diastolic 62 97 96  Pulse 108 99 100    BP 108/62   Pulse (!) 108   Ht 5\' 8"  (1.727 m)   Wt 150 lb 12.8 oz (68.4 kg)   BMI 22.93 kg/m   Wt Readings from Last 3 Encounters:  02/23/22 150 lb 12.8 oz (68.4 kg)  02/21/22 147 lb 12.8 oz (67 kg)  02/02/22 147 lb 12.8 oz (67 kg)    Reluctant affect   CMP     Component Value Date/Time   NA 133 (L) 02/21/2022 1731   K 5.5 (H) 02/21/2022 1731   CL 101 02/21/2022 1731   CO2 22 02/21/2022 1731   GLUCOSE 348 (H) 02/21/2022 1731   BUN 20  02/21/2022 1731   CREATININE 1.22 02/21/2022 1731   CREATININE 1.05 01/23/2019 0927   CALCIUM 8.5 (L) 02/21/2022 1731   PROT 6.5 02/21/2022 1731   ALBUMIN 3.4 (L) 02/21/2022 1731   AST 13 (L) 02/21/2022 1731   ALT 12 02/21/2022 1731   ALKPHOS 89 02/21/2022 1731   BILITOT 0.6 02/21/2022 1731   GFRNONAA >60 02/21/2022 1731   GFRNONAA 83 01/23/2019 0927   GFRAA >60 01/11/2020 0859   GFRAA 96 01/23/2019 0927     Diabetic Labs (most recent): Lab Results  Component Value Date   HGBA1C 13.7 (H) 12/13/2021   HGBA1C 13.2 (A) 09/08/2021   HGBA1C 12.5 (A) 06/10/2021   MICROALBUR 990.3 (H) 09/15/2021   MICROALBUR 811.7 (H) 04/07/2020   MICROALBUR 121.6 (H) 04/24/2019     Lipid Panel ( most recent) Lipid Panel     Component Value Date/Time   CHOL 259 (H) 09/15/2021 1013   TRIG 187 (H) 09/15/2021 1013   HDL 41 09/15/2021 1013   CHOLHDL 6.3 09/15/2021 1013   VLDL 37 09/15/2021 1013   LDLCALC 181 (H) 09/15/2021 1013   LDLDIRECT 181.3 (H) 11/03/2020 1139      Lab Results  Component Value Date   TSH 3.482 12/13/2021   TSH 1.772 05/24/2017   TSH 4.778 (H) 11/25/2015     Assessment & Plan:   1. Uncontrolled type 2 diabetes mellitus with hyperglycemia (HCC)   - [name] [name] has currently uncontrolled symptomatic type 2 DM since  51 years of age. Recent labs reviewed.  [name] [name] presents with his meter with inadequate monitoring.  He  did not bring any logs.  His average blood glucose is still significantly above target.  His average blood glucose is 432 for the last 7 days, 421 for the last  14 days.  His most recent A1c was 13.7%.  He remains unengaged for proper monitoring of blood glucose properly.     His compliance is always questionable. He did not document any hypoglycemia.   - I had a long discussion with him about the progressive nature of diabetes and the pathology behind its complications. -his diabetes is complicated by coronary artery disease and he remains at a  high risk for more acute and chronic complications which include CAD, CVA, CKD, retinopathy, and neuropathy. These are all discussed in detail with him.  - I have counseled him on diet  and weight management  by adopting a carbohydrate restricted/protein rich diet. Patient is encouraged to switch to  unprocessed or minimally processed     complex starch and increased protein intake (animal or plant source), fruits, and vegetables. -  he is advised to stick to a routine mealtimes to eat 3 meals  a day and avoid unnecessary snacks ( to snack only to correct hypoglycemia).    - he acknowledges that there is a room for improvement in his food and drink choices. - Suggestion is made for him to avoid simple carbohydrates  from his diet including Cakes, Sweet Desserts, Ice Cream, Soda (diet and regular), Sweet Tea, Candies, Chips, Cookies, Store Bought Juices, Alcohol , Artificial Sweeteners,  Coffee Creamer, and "Sugar-free" Products, Lemonade. This will help patient to have more stable blood glucose profile and potentially avoid unintended weight gain.  - he has been scheduled with Norm Salt, RDN, CDE for diabetes education.  - I have approached him with the following individualized plan to manage  his diabetes and patient agrees:   -He remains unengaged for proper monitoring of blood glucose for optimal use of insulin.  In light of his current and prevailing glycemic burden, he will continue to require multiple daily injections of insulin in order for him to control diabetes to target.  He did not follow recommendations for intensive basal/bolus insulin regimen.   He will be continued on his premixed insulin NovoLog 70/30 .  He is advised to increase his insulin to 80 units with breakfast and 60 units with supper  only when his Premeal blood glucose readings above 90 mg per DL. He is approached again to monitor blood glucose 4 times a day-before meals and at bedtime. This patient would have  benefited from a CGM, however unfortunately at this time he does not have health insurance.    - he is warned not to take insulin without proper monitoring per orders. - he is encouraged to call clinic for blood glucose levels less than 70 or above 300 mg /dl. -He is advised to continue metformin  500 mg p.o. twice daily-daily after breakfast and after supper. -He is advised to continue glipizide 5 mg XL p.o. daily at breakfast.    - he is not a candidate for incretin therapy due to his current heavy smoking, at risk for pancreatitis.    - Specific targets for  A1c;  LDL, HDL,  and Triglycerides were discussed with the patient.  2) Blood Pressure /Hypertension:  -His blood pressure is controlled to target at 108/62.   3) Lipids/Hyperlipidemia:   Review of his recent lipid panel showed un controlled LDL 181.  He has not been consistent taking his statin.  He is advised to resume and continue his atorvastatin 80 mg p.o. nightly.      Side effects and precautions discussed with him.  Cold foods plant-based diet was discussed with him.  4)  Weight/Diet:  Body mass index is 22.93 kg/m.  -  he is not a candidate for major weight loss. I discussed with him the fact that loss of 5 - 10% of his  current body weight will have the most impact on his diabetes management.  Exercise, and detailed carbohydrates information provided  -  detailed on discharge instructions.  5) Chronic Care/Health Maintenance:  -he  is on Statin medications and  is encouraged to initiate and continue to follow up with Ophthalmology, Dentist,  Podiatrist at least yearly or according to recommendations, and advised to  quit smoking. I have recommended yearly flu vaccine and pneumonia vaccine at least every 5 years; moderate intensity exercise for up to 150 minutes weekly; and  sleep for at least 7 hours a day.  The patient was counseled on the dangers of tobacco use, and was advised to quit.  Reviewed strategies to maximize  success, including removing cigarettes and smoking materials from environment.                                          He had normal ABI on May 08, 2020.  This study will be repeated in December 2026, or sooner if needed.  - he is  advised to maintain close follow up with [name], PA-C for primary care needs, as well as his other providers for optimal and coordinated care.   I spent 31 minutes in the care of the patient today including review of labs from CMP, Lipids, Thyroid Function, Hematology (current and previous including abstractions from other facilities); face-to-face time discussing  his blood glucose readings/logs, discussing hypoglycemia and hyperglycemia episodes and symptoms, medications doses, his options of short and long term treatment based on the latest standards of care / guidelines;  discussion about incorporating lifestyle medicine;  and documenting the encounter. Risk reduction counseling performed per USPSTF guidelines to reduce  cardiovascular risk factors.     Please refer to Patient Instructions for Blood Glucose Monitoring and Insulin/Medications Dosing Guide"  in media tab for additional information. Please  also refer to " Patient Self Inventory" in the Media  tab for reviewed elements of pertinent patient history.  [name] [name] participated in the discussions, expressed understanding, and voiced agreement with the above plans.  All questions were answered to his satisfaction. he is encouraged to contact clinic should he have any questions or concerns prior to his return visit.   Follow up plan: Return in 7 days for reevaluation. [name] Lunch, MD [facility] [address] [place], [place] [address] Phone: [phone]  Fax: [phone]    02/23/2022, 12:52 PM  This note was partially dictated with voice recognition software. Similar sounding words can be transcribed inadequately or may not   be corrected upon review.

## 2022-02-23 NOTE — Patient Instructions (Signed)

## 2022-04-07 ENCOUNTER — Other Ambulatory Visit: Payer: Self-pay | Admitting: Physician Assistant

## 2022-04-07 ENCOUNTER — Other Ambulatory Visit: Payer: Self-pay | Admitting: "Endocrinology

## 2022-04-27 ENCOUNTER — Ambulatory Visit: Payer: Medicaid Other | Admitting: "Endocrinology

## 2022-04-27 NOTE — Progress Notes (Unsigned)
Cardiology Office Note  Date: 04/28/2022   ID: [name] [name], DOB [birth year], MRN [id number]  PCP:  Lindaann Pascal, PA-C  Cardiologist:  Nona Dell, MD Electrophysiologist:  None   Chief Complaint  Patient presents with   Cardiac follow-up    History of Present Illness: [name] [name] is a 51 y.o. male last seen in August.  He is here for a follow-up visit.  Still reports orthostatic symptoms although less intense since we started midodrine.  We discussed increasing dose to 5 mg 3 times a day and uptitrating from there if necessary.  He now has Medicaid which starts in December so hopefully this will be helpful with his prescriptions.  I reviewed the remainder of his medications which are outlined below.  Past Medical History:  Diagnosis Date   Anxiety    Arthritis    Asthma    CAD (coronary artery disease)    Moderate LAD disease 2016 - Dr. 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, bilateral    Sleep apnea 2016   Type 2 diabetes mellitus (HCC)     Past Surgical History:  Procedure Laterality Date   ANKLE SURGERY Right 1982   "had extra bones in there; took them out"   APPENDECTOMY  1975   BIOPSY  12/26/2018   Procedure: BIOPSY;  Surgeon: Malissa Hippo, MD;  [facility];  Service: Endoscopy;;  duodenal biopsies   BIOPSY  03/23/2019   Procedure: BIOPSY;  Surgeon: Malissa Hippo, MD;  [facility];  Service: Endoscopy;;  esophagus   CARDIAC CATHETERIZATION N/A 03/28/2015   Procedure: Left Heart Cath and Coronary Angiography;  Surgeon: Yates Decamp, MD;  Location: [facility] INVASIVE CV LAB;  Service: Cardiovascular;  Laterality: N/A;   CARDIAC CATHETERIZATION N/A 03/28/2015    Procedure: Intravascular Pressure Wire/FFR Study;  Surgeon: Yates Decamp, MD;  Location: [facility] INVASIVE CV LAB;  Service: Cardiovascular;  Laterality: N/A;   CARPAL TUNNEL RELEASE Left ~ 2008   COLONOSCOPY WITH ESOPHAGOGASTRODUODENOSCOPY (EGD)     COLONOSCOPY WITH PROPOFOL N/A 12/24/2019   Procedure: COLONOSCOPY WITH PROPOFOL;  Surgeon: Malissa Hippo, MD;  [facility];  Service: Endoscopy;  Laterality: N/A;  955   ELBOW FRACTURE SURGERY Left ~ 2008   ESOPHAGEAL MANOMETRY N/A 09/16/2021   Procedure: ESOPHAGEAL MANOMETRY (EM);  Surgeon: Shellia Cleverly, DO;  Location: [facility];  Service: Endoscopy;  Laterality: N/A;   ESOPHAGOGASTRODUODENOSCOPY N/A 12/26/2018   Procedure: ESOPHAGOGASTRODUODENOSCOPY (EGD);  Surgeon: Malissa Hippo, MD;  [facility];  Service: Endoscopy;  Laterality: N/A;   ESOPHAGOGASTRODUODENOSCOPY (EGD) WITH PROPOFOL N/A 03/23/2019   Procedure: ESOPHAGOGASTRODUODENOSCOPY (EGD) WITH PROPOFOL;  Surgeon: Malissa Hippo, MD;  [facility];  Service: Endoscopy;  Laterality: N/A;  7:30   FRACTURE SURGERY     ankle and elbow   PH IMPEDANCE STUDY N/A 09/16/2021   Procedure: PH IMPEDANCE STUDY;  Surgeon: Shellia Cleverly, DO;  Location: [facility];  Service: Endoscopy;  Laterality: N/A;   PILONIDAL CYST EXCISION N/A 03/24/2017   Procedure: EXCISION CHRONIC  PILONIDAL ABSCESS;  Surgeon: [name] Miyamoto, MD;  Location: [facility];  Service: General;  Laterality: N/A;   SCROTAL EXPLORATION N/A 11/13/2020   Procedure: EXCISION OF SEBACEOUS CYSTS, SCROTUM;  Surgeon: Malen Gauze, MD;  Location: [facility];  Service: Urology;  Laterality: N/A;   TENDON REPAIR Left ~ 2004   "main tendon in my ankle"    Current Outpatient Medications  Medication Sig Dispense Refill   acetaminophen (TYLENOL) 500 MG tablet Take 1,500 mg by mouth in the morning and at bedtime.     albuterol (VENTOLIN HFA) 108 (90 Base) MCG/ACT inhaler INHALE 2 PUFFS BY MOUTH EVERY  6 HOURS AS NEEDED FOR COUGHING, WHEEZING, OR SHORTNESS OF BREATH 20.1 g 0   ARIPiprazole (ABILIFY) 10 MG tablet Take 10 mg by mouth daily.     aspirin EC 81 MG tablet Take 1 tablet by mouth daily with breakfast.     atorvastatin (LIPITOR) 80 MG tablet TAKE 1 Tablet BY MOUTH ONCE EVERY DAY 90 tablet 0   Continuous Blood Gluc Receiver (DEXCOM G7 RECEIVER) DEVI Use to monitor BG continuously 1 each 0   Continuous Blood Gluc Sensor (DEXCOM G7 SENSOR) MISC Change sensor every 10 days 3 each 2   famotidine (PEPCID) 40 MG tablet Take 1 tablet (40 mg total) by mouth 2 (two) times daily. 60 tablet 3   gabapentin (NEURONTIN) 300 MG capsule Take 1 capsule (300 mg total) by mouth every 8 (eight) hours as needed. 30 capsule 0   insulin aspart protamine - aspart (NOVOLOG MIX 70/30 FLEXPEN) (70-30) 100 UNIT/ML FlexPen Inject 80 units with breakfast and 60 units with supper when pre-meal glucose is above 90 30 mL 2   metFORMIN (GLUCOPHAGE) 500 MG tablet TAKE 1 Tablet  BY MOUTH TWICE DAILY WITH A MEAL 180 tablet 0   midodrine (PROAMATINE) 5 MG tablet Take 1 tablet (5 mg total) by mouth 3 (three) times daily with meals. 90 tablet 6   nitroGLYCERIN (NITROSTAT) 0.4 MG SL tablet Place 1 tablet (0.4 mg total) under the tongue every 5 (five) minutes as needed for chest pain. 25 tablet 3   ondansetron (ZOFRAN) 4 MG tablet Take 4 mg by mouth as needed.     polyethylene glycol (MIRALAX / GLYCOLAX) 17 g packet Take 17 g by mouth daily. (Patient taking differently: Take 17 g by mouth daily as needed for moderate constipation.) 14 each 0   promethazine (PHENERGAN) 12.5 MG suppository Place 12.5 mg rectally every 6 (six) hours as needed for nausea or vomiting.     promethazine (PHENERGAN) 12.5 MG tablet Take 12.5 mg by mouth every 6 (six) hours as needed for nausea or vomiting.     sertraline (ZOLOFT) 100 MG tablet Take 1 tablet by mouth daily.     traZODone (DESYREL) 50 MG tablet Take 50 mg by mouth at bedtime.     No current  facility-administered medications for this visit.   Allergies:  Omeprazole magnesium, Bee venom, Esomeprazole, and Morphine and related   ROS: No orthopnea or PND.  Physical Exam: VS:  BP 136/70   Pulse (!) 101   Ht 5\' 8"  (1.727 m)   Wt 146 lb 9.6 oz (66.5 kg)   SpO2 98%   BMI 22.29 kg/m , BMI Body mass index is 22.29 kg/m.  Wt Readings from Last 3 Encounters:  04/28/22 146 lb 9.6 oz (66.5 kg)  02/23/22 150 lb 12.8 oz (68.4 kg)  02/21/22 147 lb 12.8 oz (67 kg)    General: Patient appears comfortable at rest. HEENT: Conjunctiva and  lids normal. Neck: Supple, no elevated JVP or carotid bruits. Lungs: Clear to auscultation, nonlabored breathing at rest. Cardiac: Regular rate and rhythm, no S3 or significant systolic murmur.  ECG:  An ECG dated 01/18/2022 was personally reviewed today and demonstrated:  Sinus tachycardia with right bundle branch block and left anterior fascicular block.  Recent Labwork: 12/01/2021: B Natriuretic Peptide 158.0 12/13/2021: TSH 3.482 01/18/2022: Magnesium 1.9 02/21/2022: ALT 12; AST 13; BUN 20; Creatinine, Ser 1.22; Hemoglobin 15.3; Platelets 255; Potassium 5.5; Sodium 133     Component Value Date/Time   CHOL 259 (H) 09/15/2021 1013   TRIG 187 (H) 09/15/2021 1013   HDL 41 09/15/2021 1013   CHOLHDL 6.3 09/15/2021 1013   VLDL 37 09/15/2021 1013   LDLCALC 181 (H) 09/15/2021 1013   LDLDIRECT 181.3 (H) 11/03/2020 1139    Other Studies Reviewed Today:  Lexiscan Myoview 01/13/2021: No diagnostic ST segment changes to indicate ischemia. Small, mild intensity, apical inferior defect that is partially reversible and consistent with a mild ischemic territory. This is a low risk study. Nuclear stress EF: 57%.   Chest CTA 12/01/2021: IMPRESSION: 1. No pulmonary embolism. 2. Lungs are clear. 3. Diffuse thickening of the distal third of the esophagus, which can be seen in the setting of GERD.   Head CT 01/18/2022: IMPRESSION: No evidence of acute  intracranial abnormality.  Assessment and Plan:  1.  Orthostatic hypotension in the setting of suspected autonomic dysfunction with diabetic neuropathy.  Discussed hydration status, also doing somewhat better on midodrine which we will increase to 5 mg 3 times a day.  This can be uptitrated to 10 mg 3 times a day if necessary.  Plan office follow-up with repeat orthostatics.  2.  Moderate nonobstructive CAD by previous work-up.  No obvious angina at this time.  Continue observation on aspirin and Lipitor.  Hoping that he can be more consistent with his medications now that he has Medicaid.  Medication Adjustments/Labs and Tests Ordered: Current medicines are reviewed at length with the patient today.  Concerns regarding medicines are outlined above.   Tests Ordered: No orders of the defined types were placed in this encounter.   Medication Changes: Meds ordered this encounter  Medications   midodrine (PROAMATINE) 5 MG tablet    Sig: Take 1 tablet (5 mg total) by mouth 3 (three) times daily with meals.    Dispense:  90 tablet    Refill:  6    04/28/22 increased from BID to TID    Disposition:  Follow up  3 months.  Signed, Jonelle Sidle, MD, [facility] 04/28/2022 10:51 AM    [facility] at [facility] [address] [place], [place] [address] Phone: [phone]; Fax: [phone]

## 2022-04-28 ENCOUNTER — Ambulatory Visit: Payer: Self-pay | Attending: Cardiology | Admitting: Cardiology

## 2022-04-28 ENCOUNTER — Encounter: Payer: Self-pay | Admitting: Cardiology

## 2022-04-28 VITALS — BP 136/70 | HR 101 | Ht 68.0 in | Wt 146.6 lb

## 2022-04-28 DIAGNOSIS — I951 Orthostatic hypotension: Secondary | ICD-10-CM

## 2022-04-28 DIAGNOSIS — I2510 Atherosclerotic heart disease of native coronary artery without angina pectoris: Secondary | ICD-10-CM

## 2022-04-28 MED ORDER — MIDODRINE HCL 5 MG PO TABS: 5.0000 mg | ORAL_TABLET | Freq: Three times a day (TID) | ORAL | 6 refills | Status: DC

## 2022-04-28 NOTE — Patient Instructions (Signed)
Medication Instructions:  INCREASE Midodrine to 5 mg THREE times a day  Labwork: None today  Testing/Procedures: None today  Follow-Up: 3 months  Any Other Special Instructions Will Be Listed Below (If Applicable).  If you need a refill on your cardiac medications before your next appointment, please call your pharmacy.

## 2022-04-30 ENCOUNTER — Telehealth: Payer: Self-pay

## 2022-04-30 NOTE — Telephone Encounter (Signed)
Called client to confirm he was aware of receiving Medicaid. He is aware and states he is trying to connect with his previous provider. [facility].   Will call next week to assure client is getting connected to  PCP as he is DM uncontrolled. He has his medications. He has all numbers to connect to Medicaid and does not need those at this time.  Will change status in fhases to reflect insurance.   [name] RN [facility]/Care connect

## 2022-05-04 ENCOUNTER — Ambulatory Visit: Payer: Medicaid Other | Admitting: Physician Assistant

## 2022-05-10 ENCOUNTER — Encounter (INDEPENDENT_AMBULATORY_CARE_PROVIDER_SITE_OTHER): Payer: Self-pay | Admitting: Gastroenterology

## 2022-05-10 ENCOUNTER — Ambulatory Visit (INDEPENDENT_AMBULATORY_CARE_PROVIDER_SITE_OTHER): Payer: Medicaid Other | Admitting: Gastroenterology

## 2022-06-01 ENCOUNTER — Other Ambulatory Visit (HOSPITAL_BASED_OUTPATIENT_CLINIC_OR_DEPARTMENT_OTHER): Payer: Self-pay

## 2022-06-01 DIAGNOSIS — R0681 Apnea, not elsewhere classified: Secondary | ICD-10-CM

## 2022-06-11 ENCOUNTER — Encounter: Payer: Self-pay | Admitting: Internal Medicine

## 2022-06-11 ENCOUNTER — Ambulatory Visit (INDEPENDENT_AMBULATORY_CARE_PROVIDER_SITE_OTHER): Payer: Medicaid Other | Admitting: Internal Medicine

## 2022-06-11 VITALS — BP 104/62 | HR 104 | Temp 98.0°F | Ht 68.0 in | Wt 144.6 lb

## 2022-06-11 DIAGNOSIS — F17210 Nicotine dependence, cigarettes, uncomplicated: Secondary | ICD-10-CM

## 2022-06-11 DIAGNOSIS — J449 Chronic obstructive pulmonary disease, unspecified: Secondary | ICD-10-CM | POA: Insufficient documentation

## 2022-06-11 DIAGNOSIS — F17200 Nicotine dependence, unspecified, uncomplicated: Secondary | ICD-10-CM

## 2022-06-11 LAB — CBC WITH DIFFERENTIAL/PLATELET
Basophils Absolute: 0.1 10*3/uL (ref 0.0–0.1)
Basophils Relative: 0.4 % (ref 0.0–3.0)
Eosinophils Absolute: 0.3 10*3/uL (ref 0.0–0.7)
Eosinophils Relative: 1.5 % (ref 0.0–5.0)
HCT: 46.9 % (ref 39.0–52.0)
Hemoglobin: 15.2 g/dL (ref 13.0–17.0)
Lymphocytes Relative: 19.2 % (ref 12.0–46.0)
Lymphs Abs: 3.4 10*3/uL (ref 0.7–4.0)
MCHC: 32.5 g/dL (ref 30.0–36.0)
MCV: 93.5 fl (ref 78.0–100.0)
Monocytes Absolute: 0.8 10*3/uL (ref 0.1–1.0)
Monocytes Relative: 4.7 % (ref 3.0–12.0)
Neutro Abs: 13.3 10*3/uL — ABNORMAL HIGH (ref 1.4–7.7)
Neutrophils Relative %: 74.2 % (ref 43.0–77.0)
Platelets: 272 10*3/uL (ref 150.0–400.0)
RBC: 5.01 Mil/uL (ref 4.22–5.81)
RDW: 13.4 % (ref 11.5–15.5)
WBC: 17.9 10*3/uL — ABNORMAL HIGH (ref 4.0–10.5)

## 2022-06-11 MED ORDER — BREZTRI AEROSPHERE 160-9-4.8 MCG/ACT IN AERO: 2.0000 | INHALATION_SPRAY | Freq: Two times a day (BID) | RESPIRATORY_TRACT | 0 refills | Status: DC

## 2022-06-11 MED ORDER — BUDESONIDE-FORMOTEROL FUMARATE 80-4.5 MCG/ACT IN AERO: INHALATION_SPRAY | RESPIRATORY_TRACT | 12 refills | Status: DC

## 2022-06-11 NOTE — Progress Notes (Unsigned)
[name] [name], male    DOB: [birth year]   MRN: [id number]   Brief patient profile:  59 yowm active smoker poor ex tol entire life  referred to pulmonary clinic 06/11/2022 by Shanon Rosser  for copd eval        History of Present Illness  06/11/2022  Pulmonary/ 1st office eval/Avyukt Cimo  Chief Complaint  Patient presents with   Consult    Ref for COPD.  SOB with exertion x several years.  Dyspnea:  2 aisles slow pace at [employer] = [facility] = can't walk 100 yards even at a slow pace at a flat grade s stopping due to sob   Cough: mostly dry/ worse at hs  Sleep: on back bed is flat/ 4 pillows  SABA use: not helping much   No obvious day to day or daytime pattern/variability or assoc excess/ purulent sputum or mucus plugs or hemoptysis or cp or chest tightness, subjective wheeze or overt sinus or hb symptoms.   Sleeping  without nocturnal  or early am exacerbation  of respiratory  c/o's or need for noct saba. Also denies any obvious fluctuation of symptoms with weather or environmental changes or other aggravating or alleviating factors except as outlined above   No unusual exposure hx or h/o childhood pna/ asthma or knowledge of premature birth.  Current Allergies, Complete Past Medical History, Past Surgical History, Family History, and Social History were reviewed in [employer] record.  ROS  The following are not active complaints unless bolded Hoarseness, sore throat, dysphagia, dental problems, itching, sneezing,  nasal congestion or discharge of excess mucus or purulent secretions, ear ache,   fever, chills, sweats, unintended wt loss or wt gain, classically pleuritic or exertional cp,  orthopnea pnd or arm/hand swelling  or leg swelling, presyncope, palpitations, abdominal pain, anorexia, nausea, vomiting, diarrhea  or change in bowel habits or change in bladder habits, change in stools or change in urine, dysuria, hematuria,  rash, arthralgias, visual complaints,  headache, numbness, weakness or ataxia or problems with walking or coordination,  change in mood or  memory.           Past Medical History:  Diagnosis Date   Anxiety    Arthritis    Asthma    CAD (coronary artery disease)    Moderate LAD disease 2016 - Dr. 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     albuterol (VENTOLIN HFA) 108 (90 Base) MCG/ACT inhaler INHALE 2 PUFFS BY MOUTH EVERY 6 HOURS AS NEEDED FOR COUGHING, WHEEZING, OR SHORTNESS OF BREATH 20.1 g 0   ALPRAZolam (XANAX) 0.5 MG tablet Take 0.25-0.5 mg by mouth 3 (three) times daily as needed.     ARIPiprazole (ABILIFY) 10 MG tablet Take 10 mg by mouth daily.     aspirin EC 81 MG tablet Take 1 tablet by mouth daily  with breakfast.     atorvastatin (LIPITOR) 80 MG tablet TAKE 1 Tablet BY MOUTH ONCE EVERY DAY 90 tablet 0   BD PEN NEEDLE MICRO U/F 32G X 6 MM MISC See admin instructions.     Continuous Blood Gluc Receiver (DEXCOM G7 RECEIVER) DEVI Use to monitor BG continuously 1 each 0   Continuous Blood Gluc Sensor (DEXCOM G7 SENSOR) MISC Change sensor every 10 days 3 each 2   famotidine (PEPCID) 40 MG tablet Take 1 tablet (40 mg total) by mouth 2 (two) times daily. 60 tablet 3   gabapentin (NEURONTIN) 300 MG capsule Take 1 capsule (300 mg total) by mouth every 8 (eight) hours as needed. 30 capsule 0   insulin aspart protamine - aspart (NOVOLOG MIX 70/30 FLEXPEN) (70-30) 100 UNIT/ML FlexPen Inject 80 units with breakfast and 60  units with supper when pre-meal glucose is above 90 30 mL 2   isosorbide mononitrate (IMDUR) 30 MG 24 hr tablet TAKE 1 Tablet BY MOUTH ONCE EVERY DAY     metFORMIN (GLUCOPHAGE) 500 MG tablet TAKE 1 Tablet  BY MOUTH TWICE DAILY WITH A MEAL 180 tablet 0   midodrine (PROAMATINE) 5 MG tablet Take 1 tablet (5 mg total) by mouth 3 (three) times daily with meals. 270 tablet 6   nitroGLYCERIN (NITROSTAT) 0.4 MG SL tablet Place 1 tablet (0.4 mg total) under the tongue every 5 (five) minutes as needed for chest pain. 25 tablet 3   ondansetron (ZOFRAN) 4 MG tablet Take 4 mg by mouth as needed.     polyethylene glycol (MIRALAX / GLYCOLAX) 17 g packet Take 17 g by mouth daily. (Patient taking differently: Take 17 g by mouth daily as needed for moderate constipation.) 14 each 0   promethazine (PHENERGAN) 12.5 MG suppository Place 12.5 mg rectally every 6 (six) hours as needed for nausea or vomiting.     promethazine (PHENERGAN) 12.5 MG tablet Take 12.5 mg by mouth every 6 (six) hours as needed for nausea or vomiting.     sertraline (ZOLOFT) 100 MG tablet Take 1 tablet by mouth daily.     traZODone (DESYREL) 50 MG tablet Take 50 mg by mouth at bedtime.     No facility-administered medications prior to visit.     Objective:     BP 104/62 (BP Location: Left Arm, Patient Position: Sitting, Cuff Size: Normal)   Pulse (!) 104   Temp 98 F (36.7 C) (Oral)   Ht 5\' 8"  (1.727 m)   Wt 144 lb 9.6 oz (65.6 kg)   SpO2 97%   BMI 21.99 kg/m   SpO2: 97 % amb wm easily confused with details of care    HEENT : Oropharynx  clear/ edentulous      NECK :  without  apparent JVD/ palpable Nodes/TM    LUNGS: no acc muscle use,  Min barrel  contour chest wall with bilateral  slightly decreased bs s audible wheeze and  without cough on insp or exp maneuvers and min  Hyperresonant  to  percussion bilaterally    CV:  RRR  no s3 or murmur or increase in P2, and no edema   ABD:  soft and nontender with pos end  insp  Hoover's  in the supine position.  No bruits or organomegaly appreciated   MS:  Nl gait/ ext warm without deformities Or obvious joint restrictions  calf tenderness, cyanosis or clubbing     SKIN: warm and dry without lesions    NEURO:  alert, approp, nl sensorium with  no motor or cerebellar deficits apparent.           I personally reviewed images and agree with radiology impression as follows:  CXR:   portable  01/18/22 The heart size and mediastinal contours are within normal limits. Both lungs are clear. The visualized skeletal structures are unremarkable.   Chest CTA 12/01/2021: IMPRESSION: 1. No pulmonary embolism. 2. Lungs are clear. 3. Diffuse thickening of the distal third of the esophagus, which can be seen in the setting of GERD.    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 No walking devices needed. Patient was only able to complete 250 ft due to chest and back pain "his usual" and sats still 99% RA  DDX of  difficult airways management almost all start with A and  include Adherence, Ace Inhibitors, Acid Reflux, Active Sinus Disease, Alpha 1 Antitripsin deficiency, Anxiety masquerading as Airways dz,  ABPA,  Allergy(esp in young), Aspiration (esp in elderly), Adverse effects of meds,  Active smoking or vaping, A bunch of PE's (a small clot burden can't cause this syndrome unless there is already severe underlying pulm or vascular dz with poor reserve) plus two Bs  = Bronchiectasis and Beta blocker use..and one C= CHF  Adherence is always the initial "prime suspect" and is a multilayered concern that requires a "trust but verify" approach in every patient - starting with knowing how to use medications, especially inhalers, correctly, keeping up  with refills and understanding the fundamental difference between maintenance and prns vs those medications only taken for a very short course and then stopped and not refilled.  - see hfa teaching - return with all meds in hand using a trust but verify approach to confirm accurate Medication  Reconciliation The principal here is that until we are certain that the  patients are doing what we've asked, it makes no sense to ask them to do more.   Active smoking at top of the rest of the list of usual suspects (see separate a/p)   ? Acid (or non-acid) GERD > always difficult to exclude as up to 75% of pts in some series report no assoc GI/ Heartburn symptoms> rec continue pepcid bid for now  ? Allergy /asthma > try low dose symbicort  plus approp saba pending return for pfts as insurance won't cover breztri, the best choice Re SABA :  I spent extra time with pt today reviewing appropriate use of albuterol for prn use on exertion with the following points: 1) saba is for relief of sob that does not improve by walking a slower pace or resting but rather if the pt does not improve after trying this first. 2) If the pt is convinced, as many are, that saba helps recover from activity faster then it's easy to tell if this is the case by re-challenging : ie stop, take the inhaler, then p 5 minutes try the exact same activity (intensity of workload) that just caused the symptoms and see if they are substantially diminished or not after saba 3) if there is an activity that reproducibly causes the symptoms, try the saba 15 min before the activity on alternate days   If  in fact the saba really does help, then fine to continue to use it prn but advised may need to look closer at the maintenance regimen (symbicort 80 for now given the upper airway component of his coughing)  being used to achieve better control of airways disease with exertion.   ? Anxiety/depression/ deconditioning  > usually at the bottom of this  list of usual suspects but should be much higher on this pt's based on H and P and note already on psychotropics and may interfere with adherence and also interpretation of response or lack thereof to symptom management which can be quite subjective.   ? 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 CHF / IHD > Domenic Polite sees with last bnp unimpressive and mild CAD per prior w/u   F/u in [place] with pfts   Each maintenance medication was reviewed in detail including emphasizing most importantly the difference between maintenance and prns and under what circumstances the prns are to be triggered using an action plan format where appropriate.  Total time for H and P, chart review, counseling, reviewing hfa device(s) , directly observing portions of ambulatory 02 saturation study/ and generating customized AVS unique to this office visit / same day charting > 30 min new pt eval                  Current smoker 4-5 min discussion re active cigarette smoking in addition to office E&M  Ask about tobacco use:   ongoing Advise quitting   I took an extended  opportunity with this patient to outline the consequences of continued cigarette use  in airway disorders based on all the data we have from the multiple national lung health studies (perfomed over decades at millions of dollars in cost)  indicating that smoking cessation, not choice of inhalers or physicians, is the most important aspect of his care.   Assess willingness:  Not committed at this point Assist in quit attempt:  Per PCP when ready Arrange follow up:   Follow up per Primary Care planned   Also discussed LDSCT but not due for another Chest ct until 11/2022 so will address next ov if can get him to agree to regular f/u.           Christinia Gully, MD 06/11/2022

## 2022-06-11 NOTE — Patient Instructions (Signed)
Plan A = Automatic = Always=    Symbicort (same device as breztri) 80 Take 2 puffs first thing in am and then another 2 puffs about 12 hours later(optional)    Work on inhaler technique:  relax and gently blow all the way out then take a nice smooth full deep breath back in, triggering the inhaler at same time you start breathing in.  Hold breath in for at least  5 seconds if you can. Blow out symbicort thru nose. Rinse and gargle with water when done.  If mouth or throat bother you at all,  try brushing teeth/gums/tongue with arm and hammer toothpaste/ make a slurry and gargle and spit out.      Plan B = Backup (to supplement plan A, not to replace it) Only use your albuterol inhaler as a rescue medication to be used if you can't catch your breath by resting or doing a relaxed purse lip breathing pattern.  - The less you use it, the better it will work when you need it. - Ok to use the inhaler up to 2 puffs  every 4 hours if you must but call for appointment if use goes up over your usual need - Don't leave home without it !!  (think of it like the spare tire for your car)   Also  Ok to try albuterol 15 min before an activity (on alternating days)  that you know would usually make you short of breath and see if it makes any difference and if makes none then don't take albuterol after activity unless you can't catch your breath as this means it's the resting that helps, not the albuterol.  The key is to stop smoking completely before smoking completely stops you!      Please schedule a follow up visit in 3 months but call sooner if needed IN [place] WITH PFTS same day- bring inhalers

## 2022-06-12 NOTE — Assessment & Plan Note (Signed)
4-5 min discussion re active cigarette smoking in addition to office E&M  Ask about tobacco use:   ongoing Advise quitting   I took an extended  opportunity with this patient to outline the consequences of continued cigarette use  in airway disorders based on all the data we have from the multiple national lung health studies (perfomed over decades at millions of dollars in cost)  indicating that smoking cessation, not choice of inhalers or physicians, is the most important aspect of his care.   Assess willingness:  Not committed at this point Assist in quit attempt:  Per PCP when ready Arrange follow up:   Follow up per Primary Care planned   Also discussed LDSCT but not due for another Chest ct until 11/2022 so will address next ov if can get him to agree to regular f/u.

## 2022-06-12 NOTE — Assessment & Plan Note (Addendum)
Active smoker  - Labs ordered 06/11/2022  :  Eos  0.3   alpha one AT phenotype   - 06/11/2022  After extensive coaching inhaler device,  effectiveness =    75% try brezti > symbicort 80 - 06/11/2022 Patient walked at slow to moderate pace. No walking devices needed. Patient was only able to complete 250 ft due to chest and back pain "his usual" and sats still 99% RA  DDX of  difficult airways management almost all start with A and  include Adherence, Ace Inhibitors, Acid Reflux, Active Sinus Disease, Alpha 1 Antitripsin deficiency, Anxiety masquerading as Airways dz,  ABPA,  Allergy(esp in young), Aspiration (esp in elderly), Adverse effects of meds,  Active smoking or vaping, A bunch of PE's (a small clot burden can't cause this syndrome unless there is already severe underlying pulm or vascular dz with poor reserve) plus two Bs  = Bronchiectasis and Beta blocker use..and one C= CHF  Adherence is always the initial "prime suspect" and is a multilayered concern that requires a "trust but verify" approach in every patient - starting with knowing how to use medications, especially inhalers, correctly, keeping up with refills and understanding the fundamental difference between maintenance and prns vs those medications only taken for a very short course and then stopped and not refilled.  - see hfa teaching - return with all meds in hand using a trust but verify approach to confirm accurate Medication  Reconciliation The principal here is that until we are certain that the  patients are doing what we've asked, it makes no sense to ask them to do more.   Active smoking at top of the rest of the list of usual suspects (see separate a/p)   ? Acid (or non-acid) GERD > always difficult to exclude as up to 75% of pts in some series report no assoc GI/ Heartburn symptoms> rec continue pepcid bid for now  ? Allergy /asthma > try low dose symbicort  plus approp saba pending return for pfts as insurance won't cover  breztri, the best choice Re SABA :  I spent extra time with pt today reviewing appropriate use of albuterol for prn use on exertion with the following points: 1) saba is for relief of sob that does not improve by walking a slower pace or resting but rather if the pt does not improve after trying this first. 2) If the pt is convinced, as many are, that saba helps recover from activity faster then it's easy to tell if this is the case by re-challenging : ie stop, take the inhaler, then p 5 minutes try the exact same activity (intensity of workload) that just caused the symptoms and see if they are substantially diminished or not after saba 3) if there is an activity that reproducibly causes the symptoms, try the saba 15 min before the activity on alternate days   If in fact the saba really does help, then fine to continue to use it prn but advised may need to look closer at the maintenance regimen (symbicort 80 for now given the upper airway component of his coughing)  being used to achieve better control of airways disease with exertion.   ? Anxiety/depression/ deconditioning  > usually at the bottom of this list of usual suspects but should be much higher on this pt's based on H and P and note already on psychotropics and may interfere with adherence and also interpretation of response or lack thereof to symptom management which  can be quite subjective.   ? 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 CHF / IHD > Domenic Polite sees with last bnp unimpressive and mild CAD per prior w/u   F/u in [place] with pfts   Each maintenance medication was reviewed in detail including emphasizing most importantly the difference between maintenance and prns and under what circumstances the prns are to be triggered using an action plan format where appropriate.  Total time for H and P, chart review, counseling, reviewing hfa device(s) , directly observing portions of ambulatory 02 saturation study/ and generating  customized AVS unique to this office visit / same day charting > 30 min new pt eval

## 2022-06-17 LAB — ALPHA-1-ANTITRYPSIN PHENOTYP: A-1 Antitrypsin: 147 mg/dL (ref 101–187)

## 2022-06-18 ENCOUNTER — Inpatient Hospital Stay: Payer: Medicaid Other | Attending: Hematology | Admitting: Hematology

## 2022-06-18 ENCOUNTER — Encounter: Payer: Self-pay | Admitting: Hematology

## 2022-06-18 ENCOUNTER — Other Ambulatory Visit: Payer: Self-pay

## 2022-06-18 ENCOUNTER — Inpatient Hospital Stay: Payer: Medicaid Other

## 2022-06-18 VITALS — BP 98/72 | HR 103 | Temp 98.3°F | Resp 16 | Ht 68.0 in | Wt 144.5 lb

## 2022-06-18 DIAGNOSIS — Z7951 Long term (current) use of inhaled steroids: Secondary | ICD-10-CM | POA: Insufficient documentation

## 2022-06-18 DIAGNOSIS — I10 Essential (primary) hypertension: Secondary | ICD-10-CM | POA: Insufficient documentation

## 2022-06-18 DIAGNOSIS — F17210 Nicotine dependence, cigarettes, uncomplicated: Secondary | ICD-10-CM | POA: Insufficient documentation

## 2022-06-18 DIAGNOSIS — Z79899 Other long term (current) drug therapy: Secondary | ICD-10-CM | POA: Insufficient documentation

## 2022-06-18 DIAGNOSIS — E1140 Type 2 diabetes mellitus with diabetic neuropathy, unspecified: Secondary | ICD-10-CM | POA: Insufficient documentation

## 2022-06-18 DIAGNOSIS — E119 Type 2 diabetes mellitus without complications: Secondary | ICD-10-CM | POA: Diagnosis not present

## 2022-06-18 DIAGNOSIS — K219 Gastro-esophageal reflux disease without esophagitis: Secondary | ICD-10-CM | POA: Insufficient documentation

## 2022-06-18 DIAGNOSIS — D72828 Other elevated white blood cell count: Secondary | ICD-10-CM | POA: Diagnosis not present

## 2022-06-18 DIAGNOSIS — Z7984 Long term (current) use of oral hypoglycemic drugs: Secondary | ICD-10-CM | POA: Insufficient documentation

## 2022-06-18 DIAGNOSIS — Z806 Family history of leukemia: Secondary | ICD-10-CM | POA: Insufficient documentation

## 2022-06-18 DIAGNOSIS — Z7982 Long term (current) use of aspirin: Secondary | ICD-10-CM | POA: Insufficient documentation

## 2022-06-18 DIAGNOSIS — E785 Hyperlipidemia, unspecified: Secondary | ICD-10-CM | POA: Insufficient documentation

## 2022-06-18 DIAGNOSIS — I2510 Atherosclerotic heart disease of native coronary artery without angina pectoris: Secondary | ICD-10-CM | POA: Diagnosis not present

## 2022-06-18 DIAGNOSIS — Z803 Family history of malignant neoplasm of breast: Secondary | ICD-10-CM | POA: Insufficient documentation

## 2022-06-18 DIAGNOSIS — G4730 Sleep apnea, unspecified: Secondary | ICD-10-CM | POA: Diagnosis not present

## 2022-06-18 DIAGNOSIS — J449 Chronic obstructive pulmonary disease, unspecified: Secondary | ICD-10-CM | POA: Diagnosis not present

## 2022-06-18 DIAGNOSIS — Z801 Family history of malignant neoplasm of trachea, bronchus and lung: Secondary | ICD-10-CM | POA: Insufficient documentation

## 2022-06-18 LAB — CBC WITH DIFFERENTIAL/PLATELET
Abs Immature Granulocytes: 0.04 10*3/uL (ref 0.00–0.07)
Basophils Absolute: 0.1 10*3/uL (ref 0.0–0.1)
Basophils Relative: 1 %
Eosinophils Absolute: 0.2 10*3/uL (ref 0.0–0.5)
Eosinophils Relative: 1 %
HCT: 45.5 % (ref 39.0–52.0)
Hemoglobin: 15.3 g/dL (ref 13.0–17.0)
Immature Granulocytes: 0 %
Lymphocytes Relative: 21 %
Lymphs Abs: 2.7 10*3/uL (ref 0.7–4.0)
MCH: 30.8 pg (ref 26.0–34.0)
MCHC: 33.6 g/dL (ref 30.0–36.0)
MCV: 91.5 fL (ref 80.0–100.0)
Monocytes Absolute: 0.6 10*3/uL (ref 0.1–1.0)
Monocytes Relative: 5 %
Neutro Abs: 9.4 10*3/uL — ABNORMAL HIGH (ref 1.7–7.7)
Neutrophils Relative %: 72 %
Platelets: 264 10*3/uL (ref 150–400)
RBC: 4.97 MIL/uL (ref 4.22–5.81)
RDW: 12.8 % (ref 11.5–15.5)
WBC: 13.1 10*3/uL — ABNORMAL HIGH (ref 4.0–10.5)
nRBC: 0 % (ref 0.0–0.2)

## 2022-06-18 LAB — C-REACTIVE PROTEIN: CRP: <0.5 mg/dL (ref <1.0)

## 2022-06-18 LAB — LACTATE DEHYDROGENASE: LDH: 111 U/L (ref 98–192)

## 2022-06-18 LAB — SEDIMENTATION RATE: Sed Rate: 20 mm/hr — ABNORMAL HIGH (ref 0–16)

## 2022-06-18 NOTE — Patient Instructions (Addendum)
[name] [name]  Discharge Instructions  You were seen and examined today by Dr. Delton Coombes. Dr. Delton Coombes is a hematologist, meaning that he specializes in blood abnormalities. Dr. Delton Coombes discussed your past medical history, family history of cancers/blood conditions and the events that led to you being here today.  You were referred to Dr. Delton Coombes due to elevated white blood cells. White blood cells are typically elevated in the presence of infection as they are the blood cell responsible for fighting off infection. Yours have been high since 2017.  Dr. Delton Coombes has recommended additional labs today for further evaluation.  Follow-up as scheduled.  Thank you for choosing [facility] to provide your oncology and hematology care.   To afford each patient quality time with our provider, please arrive at least 15 minutes before your scheduled appointment time. You may need to reschedule your appointment if you arrive late (10 or more minutes). Arriving late affects you and other patients whose appointments are after yours.  Also, if you miss three or more appointments without notifying the office, you may be dismissed from the clinic at the provider's discretion.    Again, thank you for choosing [facility].  Our hope is that these requests will decrease the amount of time that you wait before being seen by our physicians.   If you have a lab appointment with the [facility] please come in thru the Main Entrance and check in at the main information desk.           _____________________________________________________________  Should you have questions after your visit to [facility], please contact our office at [phone] and follow the prompts.  Our office hours are 8:00 a.m. to 4:30 p.m. Monday - Thursday and 8:00 a.m. to 2:30 p.m. Friday.  Please note that voicemails left after 4:00 p.m. may not be  returned until the following business day.  We are closed weekends and all major holidays.  You do have access to a nurse 24-7, just call the main number to the clinic [phone] and do not press any options, hold on the line and a nurse will answer the phone.    For prescription refill requests, have your pharmacy contact our office and allow 72 hours.    Masks are optional in the cancer centers. If you would like for your care team to wear a mask while they are taking care of you, please let them know. You may have one support person who is at least 54 years old accompany you for your appointments.

## 2022-06-18 NOTE — Progress Notes (Signed)
CONSULT NOTE  Patient Care Team: Lindaann Pascal, PA-C as PCP - General (Physician Assistant) Jonelle Sidle, MD as PCP - Cardiology (Cardiology) Yates Decamp, MD as Consulting Physician (Cardiology) Doreatha Massed, MD as Medical Oncologist (Hematology)  CHIEF COMPLAINTS/PURPOSE OF CONSULTATION:  Leukocytosis  HISTORY OF PRESENTING ILLNESS:  [name] [name] 52 y.o. male is seen in consultation today for further workup and management of leukocytosis.  CBC on 06/11/2022 showed white count 17.9, differential predominantly showing neutrophils.  Hemoglobin and platelet count were normal.  He denies any recent infections.  No use of systemic steroids.  He was started on Breztri recently and will start Symbicort in place of it soon.  No B symptoms reported.  Further review of labs showed white count elevated since March 2017, predominantly neutrophils.  No personal history of connective tissue disorders.  He lives at home with his father and is trying to get on disability.  He is a current active smoker, half pack per day for 38 years.  MEDICAL HISTORY:  Past Medical History:  Diagnosis Date   Anxiety    Arthritis    Asthma    CAD (coronary artery disease)    Moderate LAD disease 2016 - Dr. 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, bilateral    Sleep apnea 2016   Type 2 diabetes mellitus (HCC)     SURGICAL HISTORY: Past Surgical History:  Procedure Laterality Date   ANKLE SURGERY Right 1982   "had extra bones in there; took them out"   APPENDECTOMY  1975   BIOPSY  12/26/2018   Procedure: BIOPSY;  Surgeon: Malissa Hippo, MD;  [facility];  Service: Endoscopy;;  duodenal biopsies   BIOPSY   03/23/2019   Procedure: BIOPSY;  Surgeon: Malissa Hippo, MD;  [facility];  Service: Endoscopy;;  esophagus   CARDIAC CATHETERIZATION N/A 03/28/2015   Procedure: Left Heart Cath and Coronary Angiography;  Surgeon: Yates Decamp, MD;  [facility] INVASIVE CV LAB;  Service: Cardiovascular;  Laterality: N/A;   CARDIAC CATHETERIZATION N/A 03/28/2015   Procedure: Intravascular Pressure Wire/FFR Study;  Surgeon: Yates Decamp, MD;  Location: [facility] INVASIVE CV LAB;  Service: Cardiovascular;  Laterality: N/A;   CARPAL TUNNEL RELEASE Left ~ 2008   COLONOSCOPY WITH ESOPHAGOGASTRODUODENOSCOPY (EGD)     COLONOSCOPY WITH PROPOFOL N/A 12/24/2019   Procedure: COLONOSCOPY WITH PROPOFOL;  Surgeon: Malissa Hippo, MD;  [facility];  Service: Endoscopy;  Laterality: N/A;  955   ELBOW FRACTURE SURGERY Left ~ 2008   ESOPHAGEAL MANOMETRY N/A 09/16/2021   Procedure: ESOPHAGEAL MANOMETRY (EM);  Surgeon: Shellia Cleverly, DO;  Location: [facility];  Service: Endoscopy;  Laterality: N/A;   ESOPHAGOGASTRODUODENOSCOPY N/A 12/26/2018   Procedure: ESOPHAGOGASTRODUODENOSCOPY (EGD);  Surgeon: Malissa Hippo, MD;  [facility];  Service: Endoscopy;  Laterality: N/A;   ESOPHAGOGASTRODUODENOSCOPY (EGD) WITH PROPOFOL N/A 03/23/2019   Procedure: ESOPHAGOGASTRODUODENOSCOPY (EGD) WITH PROPOFOL;  Surgeon: Malissa Hippo, MD;  [facility];  Service: Endoscopy;  Laterality: N/A;  7:30   FRACTURE SURGERY     ankle and elbow   PH  IMPEDANCE STUDY N/A 09/16/2021   Procedure: [place] IMPEDANCE STUDY;  Surgeon: Lavena Bullion, DO;  Location: [facility];  Service: Endoscopy;  Laterality: N/A;   PILONIDAL CYST EXCISION N/A 03/24/2017   Procedure: EXCISION CHRONIC  PILONIDAL ABSCESS;  Surgeon: Coralie Keens, MD;  Location: [facility];  Service: General;  Laterality: N/A;   SCROTAL EXPLORATION N/A 11/13/2020   Procedure: EXCISION OF SEBACEOUS CYSTS, SCROTUM;  Surgeon: Cleon Gustin, MD;   Location: [facility];  Service: Urology;  Laterality: N/A;   TENDON REPAIR Left ~ 2004   "main tendon in my ankle"    SOCIAL HISTORY: Social History   Socioeconomic History   Marital status: Single    Spouse name: Not on file   Number of children: 0   Years of education: 10th g   Highest education level: Not on file  Occupational History   Occupation: [employer] farm manager  Tobacco Use   Smoking status: Every Day    Packs/day: 0.50    Years: 34.00    Total pack years: 17.00    Types: Cigarettes    Passive exposure: Never   Smokeless tobacco: Never   Tobacco comments:    1/2 pack a day  Vaping Use   Vaping Use: Never used  Substance and Sexual Activity   Alcohol use: Never    Alcohol/week: 0.0 standard drinks of alcohol   Drug use: Never   Sexual activity: Not Currently  Other Topics Concern   Not on file  Social History Narrative   His main caretaker, his mother has terminal metastatic cancer.   Social Determinants of Health   Financial Resource Strain: Not on file  Food Insecurity: Not on file  Transportation Needs: Not on file  Physical Activity: Not on file  Stress: Not on file  Social Connections: Not on file  Intimate Partner Violence: Not on file    FAMILY HISTORY: Family History  Problem Relation Age of Onset   Diabetes Mother    Hypertension Mother    Cancer Mother    Throat cancer Mother    Diabetes Father    Hypertension Father    Hyperlipidemia Father    Congestive Heart Failure Sister    Colon cancer Neg Hx    Pancreatic cancer Neg Hx    Liver disease Neg Hx     ALLERGIES:  is allergic to omeprazole magnesium, bee venom, esomeprazole, and morphine and related.  MEDICATIONS:  Current Outpatient Medications  Medication Sig Dispense Refill   acetaminophen (TYLENOL) 500 MG tablet Take 1,500 mg by mouth in the morning and at bedtime.     albuterol (VENTOLIN HFA) 108 (90 Base) MCG/ACT inhaler INHALE 2 PUFFS BY MOUTH EVERY 6 HOURS AS NEEDED FOR  COUGHING, WHEEZING, OR SHORTNESS OF BREATH 20.1 g 0   ALPRAZolam (XANAX) 0.5 MG tablet Take 0.25-0.5 mg by mouth 3 (three) times daily as needed.     ARIPiprazole (ABILIFY) 10 MG tablet Take 10 mg by mouth daily.     aspirin EC 81 MG tablet Take 1 tablet by mouth daily with breakfast.     atorvastatin (LIPITOR) 80 MG tablet TAKE 1 Tablet BY MOUTH ONCE EVERY DAY 90 tablet 0   BD PEN NEEDLE MICRO U/F 32G X 6 MM MISC See admin instructions.     budesonide-formoterol (SYMBICORT) 80-4.5 MCG/ACT inhaler Take 2 puffs first thing in am and then another 2 puffs about 12 hours later. 1 each 12   Continuous Blood Gluc Receiver ([place])  DEVI Use to monitor BG continuously 1 each 0   Continuous Blood Gluc Sensor (DEXCOM G7 SENSOR) MISC Change sensor every 10 days 3 each 2   famotidine (PEPCID) 40 MG tablet Take 1 tablet (40 mg total) by mouth 2 (two) times daily. 60 tablet 3   gabapentin (NEURONTIN) 300 MG capsule Take 1 capsule (300 mg total) by mouth every 8 (eight) hours as needed. 30 capsule 0   insulin aspart protamine - aspart (NOVOLOG MIX 70/30 FLEXPEN) (70-30) 100 UNIT/ML FlexPen Inject 80 units with breakfast and 60 units with supper when pre-meal glucose is above 90 30 mL 2   isosorbide mononitrate (IMDUR) 30 MG 24 hr tablet TAKE 1 Tablet BY MOUTH ONCE EVERY DAY     metFORMIN (GLUCOPHAGE) 500 MG tablet TAKE 1 Tablet  BY MOUTH TWICE DAILY WITH A MEAL 180 tablet 0   midodrine (PROAMATINE) 5 MG tablet Take 1 tablet (5 mg total) by mouth 3 (three) times daily with meals. 270 tablet 6   nitroGLYCERIN (NITROSTAT) 0.4 MG SL tablet Place 1 tablet (0.4 mg total) under the tongue every 5 (five) minutes as needed for chest pain. 25 tablet 3   ondansetron (ZOFRAN) 4 MG tablet Take 4 mg by mouth as needed.     polyethylene glycol (MIRALAX / GLYCOLAX) 17 g packet Take 17 g by mouth daily. (Patient taking differently: Take 17 g by mouth daily as needed for moderate constipation.) 14 each 0   promethazine  (PHENERGAN) 12.5 MG suppository Place 12.5 mg rectally every 6 (six) hours as needed for nausea or vomiting.     promethazine (PHENERGAN) 12.5 MG tablet Take 12.5 mg by mouth every 6 (six) hours as needed for nausea or vomiting.     sertraline (ZOLOFT) 100 MG tablet Take 1 tablet by mouth daily.     traZODone (DESYREL) 50 MG tablet Take 50 mg by mouth at bedtime.     No current facility-administered medications for this visit.    REVIEW OF SYSTEMS:   Constitutional: Denies fevers, chills or abnormal night sweats Eyes: Denies blurriness of vision, double vision or watery eyes Ears, nose, mouth, throat, and face: Denies mucositis or sore throat Respiratory: Denies cough, dyspnea or wheezes Cardiovascular: Denies palpitation, chest discomfort or lower extremity swelling Gastrointestinal: Positive for nausea, constipation and diarrhea. Skin: Denies abnormal skin rashes Lymphatics: Denies new lymphadenopathy or easy bruising Neurological:Denies numbness, tingling or new weaknesses Behavioral/Psych: Mood is stable, no new changes  All other systems were reviewed with the patient and are negative.  PHYSICAL EXAMINATION: ECOG PERFORMANCE STATUS: 1 - Symptomatic but completely ambulatory  Vitals:   06/18/22 0815  BP: 98/72  Pulse: (!) 103  Resp: 16  Temp: 98.3 F (36.8 C)  SpO2: 100%   Filed Weights   06/18/22 0815  Weight: 144 lb 8 oz (65.5 kg)    GENERAL:alert, no distress and comfortable SKIN: skin color, texture, turgor are normal, no rashes or significant lesions EYES: normal, conjunctiva are pink and non-injected, sclera clear OROPHARYNX:no exudate, no erythema and lips, buccal mucosa, and tongue normal  NECK: supple, thyroid normal size, non-tender, without nodularity LYMPH:  no palpable lymphadenopathy in the cervical, axillary or inguinal LUNGS: clear to auscultation and percussion with normal breathing effort HEART: regular rate & rhythm and no murmurs and no lower  extremity edema ABDOMEN:abdomen soft, non-tender and normal bowel sounds Musculoskeletal:no cyanosis of digits and no clubbing  PSYCH: alert & oriented x 3 with fluent speech NEURO: no focal motor/sensory deficits  LABORATORY DATA:  I have reviewed the data as listed Recent Results (from the past 2160 hour(s))  Alpha-1-Antitrypsin Phenotyp     Status: None   Collection Time: 06/11/22 11:28 AM  Result Value Ref Range   A-1 Antitrypsin 147 101 - 187 mg/dL   A-1 Antitrypsin Pheno MM     Comment:        Phenotype  Population     A-1-AT Concentration*                   Incidence %   % of MM (Typical Range)        MM            86.5%       100%     (96 - 189)        MS             8.0%        86%     (83 - 161)        MZ             3.9%        61%     (60 - 111)        FM             0.4%       100%     (93 - 191)        SZ             0.3%        41%     (42 -  75)        SS             0.1%        64%     (62 - 119)        ZZ             0.05%       19%     (16 -  38)        FS             0.05%       70%     (70 - 128)        FZ            Unknown      46%     (44 -  88)        FF            Unknown                Unknown *A-1-AT concentration in the homozygous MM phenotype  is taken as the reference normal. Percent deficiency  in each phenotype is reported relative to this  reference. Ranges used to confirm phenotype.   CBC with Differential/Platelet     Status: Abnormal   Collection Time: 06/11/22 11:28 AM  Result Value Ref Range   WBC 17.9 (H) 4.0 - 10.5 K/uL   RBC 5.01 4.22 - 5.81 Mil/uL   Hemoglobin 15.2 13.0 - 17.0 g/dL   HCT 63.0 16.0 - 10.9 %   MCV 93.5 78.0 - 100.0 fl   MCHC 32.5 30.0 - 36.0 g/dL   RDW 32.3 55.7 - 32.2 %   Platelets 272.0 150.0 - 400.0 K/uL   Neutrophils Relative % 74.2 43.0 - 77.0 %   Lymphocytes Relative 19.2 12.0 - 46.0 %   Monocytes Relative 4.7  3.0 - 12.0 %   Eosinophils Relative 1.5 0.0 - 5.0 %   Basophils Relative 0.4 0.0 - 3.0 %   Neutro  Abs 13.3 (H) 1.4 - 7.7 K/uL   Lymphs Abs 3.4 0.7 - 4.0 K/uL   Monocytes Absolute 0.8 0.1 - 1.0 K/uL   Eosinophils Absolute 0.3 0.0 - 0.7 K/uL   Basophils Absolute 0.1 0.0 - 0.1 K/uL    RADIOGRAPHIC STUDIES: I have personally reviewed the radiological images as listed and agreed with the findings in the report. No results found.  ASSESSMENT:  1.  Neutrophilic leukocytosis: - Patient seen at the request of Scott Long, PA-C - No B symptoms or recurrent infections.  No systemic steroids. - Recently started on Breztri, which will be switched to Symbicort. - Increase WBC count, predominantly neutrophils since 07/2015. - CTAP (11/29/2021): Spleen-normal. - No personal history of connective tissue disorders.  2.  Social/family history: - He lives at home with his father.  Trying to get disability.  He previously worked at [employer] at [employer] [place].  He is current active smoker, half pack per day for 38 years. - Mother had breast cancer and mantle cell lymphoma. - Maternal grandmother had breast cancer.  Maternal aunt had breast cancer.  PLAN:  1.  Neutrophilic leukocytosis: - We reviewed the differential diagnosis of reactive and clonal processes. - Recommend checking CBC today with LDH, ESR and CRP. - Will check for JAK2 V617F with reflex testing and BCR/ABL by FISH to evaluate for myelo proliferative disorders. - RTC 4 weeks for follow-up.    All questions were answered. The patient knows to call the clinic with any problems, questions or concerns.      Doreatha Massed, MD 06/18/22 8:24 AM

## 2022-06-21 ENCOUNTER — Ambulatory Visit (INDEPENDENT_AMBULATORY_CARE_PROVIDER_SITE_OTHER): Payer: Medicaid Other | Admitting: Gastroenterology

## 2022-06-21 ENCOUNTER — Other Ambulatory Visit: Payer: Self-pay | Admitting: Gastroenterology

## 2022-06-21 ENCOUNTER — Encounter: Payer: Self-pay | Admitting: Gastroenterology

## 2022-06-21 VITALS — BP 118/68 | HR 98 | Ht 68.0 in | Wt 147.0 lb

## 2022-06-21 DIAGNOSIS — K2100 Gastro-esophageal reflux disease with esophagitis, without bleeding: Secondary | ICD-10-CM

## 2022-06-21 DIAGNOSIS — K449 Diaphragmatic hernia without obstruction or gangrene: Secondary | ICD-10-CM | POA: Diagnosis not present

## 2022-06-21 DIAGNOSIS — K2270 Barrett's esophagus without dysplasia: Secondary | ICD-10-CM | POA: Diagnosis not present

## 2022-06-21 DIAGNOSIS — K5909 Other constipation: Secondary | ICD-10-CM | POA: Diagnosis not present

## 2022-06-21 MED ORDER — VONOPRAZAN FUMARATE 20 MG PO TABS: 20.0000 mg | ORAL_TABLET | Freq: Every day | ORAL | 0 refills | Status: DC

## 2022-06-21 MED ORDER — LINACLOTIDE 145 MCG PO CAPS: 145.0000 ug | ORAL_CAPSULE | Freq: Every day | ORAL | 3 refills | Status: DC

## 2022-06-21 NOTE — Patient Instructions (Addendum)
We have sent the following medications to your pharmacy for you to pick up at your convenience: Vonoprazon 20 MG, linzess  Please follow up in 6 months. Give [place] a call at [phone] to schedule an appointment.    _______________________________________________________  If your blood pressure at your visit was 140/90 or greater, please contact your primary care physician to follow up on this.  _______________________________________________________  If you are age 18 or older, your body mass index should be between 23-30. Your Body mass index is 22.35 kg/m. If this is out of the aforementioned range listed, please consider follow up with your Primary Care Provider.  If you are age 30 or younger, your body mass index should be between 19-25. Your Body mass index is 22.35 kg/m. If this is out of the aformentioned range listed, please consider follow up with your Primary Care Provider.   __________________________________________________________  The [facility] providers would like to encourage you to use [facility] to communicate with providers for non-urgent requests or questions.  Due to long hold times on the telephone, sending your provider a message by [facility] may be a faster and more efficient way to get a response.  Please allow 48 business hours for a response.  Please remember that this is for non-urgent requests.   Due to recent changes in healthcare laws, you may see the results of your imaging and laboratory studies on MyChart before your provider has had a chance to review them.  We understand that in some cases there may be results that are confusing or concerning to you. Not all laboratory results come back in the same time frame and the provider may be waiting for multiple results in order to interpret others.  Please give [place] 48 hours in order for your provider to thoroughly review all the results before contacting the office for clarification of your results.    Thank you for  choosing me and [facility].  Vito Cirigliano, D.O.

## 2022-06-21 NOTE — Progress Notes (Signed)
Chief Complaint:    GERD, constipation  GI History: 52 y.o. male with a history of CAD, COPD, depression, diabetes, tobacco use, diabetic neuropathy, GERD, hyperlipidemia, HTN, migraines, OSA, chronic bronchitis, gout, anxiety, depression, initially referred to me by Dr. Jenetta Downer in 12/2020 for evaluation of possible antireflux intervention with Transoral Incisionless Fundoplication (TIF).  However, due to ineffective esophageal motility as below along with concerns for poor wound healing with hemoglobin A1c 13%, I advised against TIF in favor of continued clinical management.   Was previously thought to have some degree of gastroparesis due to uncontrolled diabetes, managed with Reglan and PPI.  Allergic reaction to Nexium and omeprazole (shortness of breath, itching, facial swelling) and transition to H2 blockers.   Has otherwise had reflux symptoms for many years.       GERD history: -Index symptoms: Heartburn, regurgitation, dysphagia -Exacerbating features: Spicy food, supine -Medications trialed: Nexium, omeprazole.  Allergy to PPIs.  Pepcid, Reglan, baclofen -Current medications: Pepcid 40 mg/day -Complications: Barrett's Esophagus, hiatal hernia   GERD evaluation: -Last EGD: 07/2021 -Barium esophagram: None -Esophageal Manometry: 08/2021: Ineffective esophageal motility with poor contractile reserve. Only 40% of swallows were peristaltic.  60% failed swallows with absent peristalsis.  Poor bolus clearance and only 4/10 swallows cleared -pH/Impedance: 08/2021 (on Pepcid): 1.  Increased esophageal acid exposure with percent time pH <4 of 14.4%.  DeMeester score 41.3 ; 2.  Esophageal acid exposure is normal predominantly in upright position ; 3.  Overall reflux events were elevated (102), predominantly acid (27) and weekly acid (75) reflux episodes ; 4.  No symptoms reported during the study -Bravo: None -GES (11/06/2020): Normal   Endoscopic History: - EGD (06/2015): Small HH,1 tongue of  salmon-colored mucosa (path: Intestinal metaplasia), normal stomach/duodenum - EGD (11/2018): LA Grade D esophagitis, Barrett's esophagus, duodenitis - EGD (11/2018): 2 cm Short segment Barrett's esophagus, irregular Z-line, 2 cm HH, portal hypertensive gastropathy, normal duodenum - EGD (07/2021): SSND Barrett's Esophagus C0-M2, LA Grade A esophagitis, 1 cm sliding HH, Hill grade 2 valve.  Benign gastric hyperplastic polyp.  Repeat in 5 years for ongoing surveillance    GERD-HRQL Questionnaire Score (12/2020): 40/50   -CT abdomen/pelvis: 11/11/2020: Normal liver, pancreas, spleen.  Mild thickening of distal thoracic esophagus  HPI:     Patient is a 52 y.o. male presenting to the Gastroenterology Clinic for follow-up.  He was last seen by me on 06/25/2021 for reflux as above.  Since then, completed upper endoscopy on 07/03/2021 and Esophageal Manometry and pH/impedance testing in 08/2021 as outlined above.  Since his last appointment with me, he followed up with his primary Gastroenterologist, Dr. Jenetta Downer, on 11/09/2021.  Was having persistent reflux symptoms despite famotidine bid.  Intolerant to PPIs in the past.  Reglan was stopped (ineffective), and he had recommended continued famotidine, added baclofen (no improvement), smoking cessation advised, and placed referral to [facility] for second opinion, but wasn't network for him and referral placed to [facility].   Today, he states he has been having nocturnal reflux symptoms with regurgitation, belching, then occasionally emesis. Can have daytime sxs which are typically post prandial. Is prescribed Phenergan and Zofran for nausea. Takes every 2-3 days in the evening.  Otherwise still taking his Pepcid twice daily as prescribed.  Separately, has been having intermittent episodes of constipation described as solid stools, straining, and has occasionally had to do manual disimpaction. Has recently started Miralax 1 cap/day with improvement. Last  colonoscopy was 11/2019 by Dr. Dereck Leep and notable for  external hemorrhoids, otherwise normal with repeat in 10 years.    Review of systems:     No chest pain, no SOB, no fevers, no urinary sx   Past Medical History:  Diagnosis Date   Anxiety    Arthritis    Asthma    CAD (coronary artery disease)    Moderate LAD disease 2016 - Dr. 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     albuterol (VENTOLIN HFA) 108 (90 Base) MCG/ACT inhaler INHALE 2 PUFFS BY MOUTH EVERY 6 HOURS AS NEEDED FOR COUGHING, WHEEZING, OR SHORTNESS OF BREATH 20.1 g 0   ALPRAZolam (XANAX) 0.5 MG tablet Take 0.25-0.5 mg by mouth 3 (three) times daily as needed.     ARIPiprazole (ABILIFY) 10 MG tablet Take 10 mg by mouth daily.     aspirin EC 81 MG tablet Take 1 tablet by mouth daily with breakfast.     atorvastatin (LIPITOR) 80 MG tablet TAKE 1 Tablet BY MOUTH ONCE EVERY DAY 90 tablet 0   BD PEN NEEDLE MICRO U/F 32G X 6 MM MISC See admin instructions.     budesonide-formoterol (SYMBICORT) 80-4.5 MCG/ACT inhaler Take 2 puffs first thing in am and then another 2 puffs about 12 hours later. 1 each 12   Continuous Blood Gluc Receiver (DEXCOM G7 RECEIVER) DEVI Use to monitor BG continuously 1 each 0   Continuous Blood Gluc Sensor (DEXCOM G7 SENSOR) MISC Change sensor every 10 days 3 each 2   famotidine  (PEPCID) 40 MG tablet Take 1 tablet (40 mg total) by mouth 2 (two) times daily. 60 tablet 3   gabapentin (NEURONTIN) 300 MG capsule Take 1 capsule (300 mg total) by mouth every 8 (eight) hours as needed. 30 capsule 0   insulin aspart protamine - aspart (NOVOLOG MIX 70/30 FLEXPEN) (70-30) 100 UNIT/ML FlexPen Inject 80 units with breakfast and 60 units with supper when pre-meal glucose is above 90 30 mL 2   isosorbide mononitrate (IMDUR) 30 MG 24 hr tablet TAKE 1 Tablet BY MOUTH ONCE EVERY DAY     metFORMIN (GLUCOPHAGE) 500 MG tablet TAKE 1 Tablet  BY MOUTH TWICE DAILY WITH A MEAL 180 tablet 0   midodrine (PROAMATINE) 5 MG tablet Take 1 tablet (5 mg total) by mouth 3 (three) times daily with meals. 270 tablet 6   nitroGLYCERIN (NITROSTAT) 0.4 MG SL tablet Place 1 tablet (0.4 mg total) under the tongue every 5 (five) minutes as needed for chest pain. 25 tablet 3   ondansetron (ZOFRAN) 4 MG tablet Take 4 mg by mouth as needed.     polyethylene glycol (MIRALAX / GLYCOLAX) 17 g packet Take 17 g by mouth daily. (Patient taking differently: Take 17 g by mouth daily as needed for moderate constipation.) 14 each  0   promethazine (PHENERGAN) 12.5 MG suppository Place 12.5 mg rectally every 6 (six) hours as needed for nausea or vomiting.     promethazine (PHENERGAN) 12.5 MG tablet Take 12.5 mg by mouth every 6 (six) hours as needed for nausea or vomiting.     sertraline (ZOLOFT) 100 MG tablet Take 1 tablet by mouth daily.     traZODone (DESYREL) 50 MG tablet Take 50 mg by mouth at bedtime.     No current facility-administered medications for this visit.    Physical Exam:     Ht 5\' 8"  (1.727 m)   Wt 147 lb (66.7 kg)   BMI 22.35 kg/m   GENERAL:  Pleasant male in NAD PSYCH: : Cooperative, normal affect NEURO: Alert and oriented x 3, no focal neurologic deficits   IMPRESSION and PLAN:    1) GERD 2) Hiatal hernia 3) Regurgitation Continued reflux symptoms despite Pepcid 40 mg twice daily.   Intolerant to PPIs (allergy).  Not a candidate for TIF or cTIF due to esophageal dysmotility along with concerns for poor wound healing due to uncontrolled diabetes.  Previous GES was otherwise normal in 2022.  - Trial course of vonoprazan 20 mg daily x 8 weeks, then reduce to 10 mg daily for maintenance therapy.  Provided with samples of 20 mg dosing today - Resume Pepcid as prescribed - Continue antireflux lifestyle/dietary modifications - She is going to try to see if he can get a second opinion for surgical intervention at [facility] or [facility] now that he has Medicaid  4) Short segment, nondysplastic Barrett's Esophagus - Continuing acid control as above - Plan for repeat upper endoscopy in 2027 for ongoing surveillance  5) Constipation - Start Linzess 145 mcg/day - Recommend at least 64 ounces of water daily - Hold MiraLAX when starting Linzess.  If needed, can reintroduce MiraLAX after 7-10 days - If still no change in symptoms/response to therapy, plan for Sitz marker study then ARM if needed.  Elevated risk for colonic dysmotility due to uncontrolled diabetes and known esophageal dysmotility - Had negative colo in 2021 with Dr. Laural Golden  RTC in 6 months or sooner prn. Can f/u with me or Dr. Jenetta Downer, but I advise against following with both  I spent 35 minutes of time, including in depth chart review, independent review of results as outlined above, communicating results with the patient directly, face-to-face time with the patient, coordinating care, and ordering studies and medications as appropriate, and documentation.           Lavena Bullion ,DO, FACG 06/21/2022, 10:41 AM

## 2022-06-23 LAB — BCR-ABL1 FISH
Cells Analyzed: 200
Cells Counted: 200

## 2022-06-24 ENCOUNTER — Inpatient Hospital Stay: Payer: Medicaid Other | Admitting: Licensed Clinical Social Worker

## 2022-06-24 DIAGNOSIS — D72828 Other elevated white blood cell count: Secondary | ICD-10-CM

## 2022-06-24 NOTE — Progress Notes (Signed)
[facility]  Initial Assessment   [name] [name] is a 52 y.o. year old male contacted by phone. Clinical Social Work was referred by medical provider for assessment of psychosocial needs.   SDOH (Social Determinants of Health) assessments performed: Yes SDOH Interventions    Flowsheet Row Nutrition from 03/26/2020 in [facility] Interventions   Depression Interventions/Treatment  Currently on Treatment       SDOH Screenings   Food Insecurity: Food Insecurity Present (06/18/2022)  Housing: Low Risk  (06/18/2022)  Transportation Needs: No Transportation Needs (06/18/2022)  Utilities: Not At Risk (06/18/2022)  Depression (PHQ2-9): High Risk (06/18/2022)  Tobacco Use: High Risk (06/21/2022)     Distress Screen completed: No     No data to display            Family/Social Information:  Housing Arrangement: patient lives with his 32 year old father and his brother lives on the other side of the house.   Family members/support persons in your life? Pt has very limited social support and does not get a great deal of assistance from his father and brother.  Pt's father contributes what he can to assist w/ pt's medications but is on a fixed income and is responsible for the home bills.  Pt's brother is not working w/ health concerns as well.  Pt pt he has not worked since 2019.  Pt has applied for SSD, but has been denied.  Pt was finally approved for Medicaid, but is still awaiting a decision regarding SSI.  Prior to approval for Medicaid, pt was going to [facility] medical clinic and his medication was taken care of.  With Medicaid pt needs to pay a $4.00 co-pay which ends up being a significant amount given the number of medications he takes.  Pt encouraged to check with each clinic he is seeing to ensure they are aware of his financial concerns and are able to check if they may individually have programs to assist pt w/  the medications they are prescribing. Transportation concerns: no  Employment: Unemployed Pt has not worked since 2019 due to light headedness and pain issues.  Income source: No income at this time.  Pt states he was informed by his Medicaid case manager that in order to qualify for SSI he will have a spend down or he needs to spend what is in his 401K prior to approval which is what pt is trying to do now.  Financial concerns: Yes, current concerns Type of concern: Utilities and Medical bills Food access concerns: no Religious or spiritual practice: Yes-[employer] [facility] Currently in place:  none  Coping/ Adjustment to diagnosis: Patient understands treatment plan and what happens next? yes Concerns about diagnosis and/or treatment: Overwhelmed by information and How will I care for myself Patient reported stressors: Insurance, Publishing rights manager, and Physical issues Hopes and/or priorities: pt's priority is to secure some kind of income w/ the hope of being able to support himself Patient enjoys  not addressed Current coping skills/ strengths: Motivation for treatment/growth     SUMMARY: Current SDOH Barriers:  Financial constraints related to no income  Clinical Social Work Clinical Goal(s):  Freight forwarder options for unmet needs related to:  Financial Strain   Interventions: Discussed common feeling and emotions when being diagnosed with cancer, and the importance of support during treatment Informed patient of the support team roles and support services at [facility] Provided CSW contact information and encouraged  patient to call with any questions or concerns Referred patient to Department of Social Services to explore community programs and encouraged pt to discuss financial assistance available at individual medical clinics to assist w/ cost of medication.   Follow Up Plan: Patient will contact CSW with any support or resource needs Patient verbalizes understanding of plan:  Yes    Henriette Combs, LCSW   Patient is participating in a Managed Medicaid Plan:  Yes

## 2022-06-27 LAB — CALR +MPL + E12-E15  (REFLEX)

## 2022-06-27 LAB — JAK2 V617F RFX CALR/MPL/E12-15

## 2022-07-06 ENCOUNTER — Other Ambulatory Visit: Payer: Self-pay

## 2022-07-06 ENCOUNTER — Encounter (HOSPITAL_COMMUNITY): Payer: Self-pay

## 2022-07-06 ENCOUNTER — Emergency Department (HOSPITAL_COMMUNITY)
Admission: EM | Admit: 2022-07-06 | Discharge: 2022-07-07 | Disposition: A | Discharge status: Home / Self Care | Payer: Medicaid Other | Attending: Emergency Medicine | Admitting: Emergency Medicine

## 2022-07-06 DIAGNOSIS — R112 Nausea with vomiting, unspecified: Secondary | ICD-10-CM | POA: Insufficient documentation

## 2022-07-06 DIAGNOSIS — Z7982 Long term (current) use of aspirin: Secondary | ICD-10-CM | POA: Diagnosis not present

## 2022-07-06 DIAGNOSIS — J449 Chronic obstructive pulmonary disease, unspecified: Secondary | ICD-10-CM | POA: Diagnosis not present

## 2022-07-06 DIAGNOSIS — E119 Type 2 diabetes mellitus without complications: Secondary | ICD-10-CM | POA: Insufficient documentation

## 2022-07-06 DIAGNOSIS — I2510 Atherosclerotic heart disease of native coronary artery without angina pectoris: Secondary | ICD-10-CM | POA: Diagnosis not present

## 2022-07-06 DIAGNOSIS — N289 Disorder of kidney and ureter, unspecified: Secondary | ICD-10-CM | POA: Diagnosis not present

## 2022-07-06 DIAGNOSIS — Z7951 Long term (current) use of inhaled steroids: Secondary | ICD-10-CM | POA: Diagnosis not present

## 2022-07-06 DIAGNOSIS — I10 Essential (primary) hypertension: Secondary | ICD-10-CM | POA: Diagnosis not present

## 2022-07-06 DIAGNOSIS — Z1152 Encounter for screening for COVID-19: Secondary | ICD-10-CM | POA: Diagnosis not present

## 2022-07-06 DIAGNOSIS — J45909 Unspecified asthma, uncomplicated: Secondary | ICD-10-CM | POA: Insufficient documentation

## 2022-07-06 DIAGNOSIS — Z7984 Long term (current) use of oral hypoglycemic drugs: Secondary | ICD-10-CM | POA: Insufficient documentation

## 2022-07-06 DIAGNOSIS — Z79899 Other long term (current) drug therapy: Secondary | ICD-10-CM | POA: Insufficient documentation

## 2022-07-06 DIAGNOSIS — Z794 Long term (current) use of insulin: Secondary | ICD-10-CM | POA: Insufficient documentation

## 2022-07-06 DIAGNOSIS — R197 Diarrhea, unspecified: Secondary | ICD-10-CM | POA: Insufficient documentation

## 2022-07-06 LAB — COMPREHENSIVE METABOLIC PANEL
ALT: 20 U/L (ref 0–44)
AST: 22 U/L (ref 15–41)
Albumin: 4.1 g/dL (ref 3.5–5.0)
Alkaline Phosphatase: 147 U/L — ABNORMAL HIGH (ref 38–126)
Anion gap: 18 — ABNORMAL HIGH (ref 5–15)
BUN: 25 mg/dL — ABNORMAL HIGH (ref 6–20)
CO2: 22 mmol/L (ref 22–32)
Calcium: 9 mg/dL (ref 8.9–10.3)
Chloride: 93 mmol/L — ABNORMAL LOW (ref 98–111)
Creatinine, Ser: 1.46 mg/dL — ABNORMAL HIGH (ref 0.61–1.24)
GFR, Estimated: 58 mL/min — ABNORMAL LOW (ref >60)
Glucose, Bld: 483 mg/dL — ABNORMAL HIGH (ref 70–99)
Potassium: 4.9 mmol/L (ref 3.5–5.1)
Sodium: 133 mmol/L — ABNORMAL LOW (ref 135–145)
Total Bilirubin: 1.4 mg/dL — ABNORMAL HIGH (ref 0.3–1.2)
Total Protein: 8.4 g/dL — ABNORMAL HIGH (ref 6.5–8.1)

## 2022-07-06 LAB — LIPASE, BLOOD: Lipase: 32 U/L (ref 11–51)

## 2022-07-06 LAB — CBC
HCT: 51 % (ref 39.0–52.0)
Hemoglobin: 17.1 g/dL — ABNORMAL HIGH (ref 13.0–17.0)
MCH: 30.6 pg (ref 26.0–34.0)
MCHC: 33.5 g/dL (ref 30.0–36.0)
MCV: 91.2 fL (ref 80.0–100.0)
Platelets: 301 10*3/uL (ref 150–400)
RBC: 5.59 MIL/uL (ref 4.22–5.81)
RDW: 13 % (ref 11.5–15.5)
WBC: 15.9 10*3/uL — ABNORMAL HIGH (ref 4.0–10.5)
nRBC: 0 % (ref 0.0–0.2)

## 2022-07-06 MED ORDER — LACTATED RINGERS IV BOLUS
1000.0000 mL | Freq: Once | INTRAVENOUS | Status: AC
  Administered 2022-07-06: 1000 mL via INTRAVENOUS

## 2022-07-06 MED ORDER — ONDANSETRON HCL 4 MG/2ML IJ SOLN
4.0000 mg | Freq: Once | INTRAMUSCULAR | Status: AC
  Administered 2022-07-06: 4 mg via INTRAVENOUS
  Filled 2022-07-06: qty 2

## 2022-07-06 NOTE — ED Triage Notes (Signed)
Pt reports vomiting and diarrhea onset today.

## 2022-07-06 NOTE — ED Notes (Signed)
Pt unable to urinate at this time.  

## 2022-07-07 LAB — BASIC METABOLIC PANEL
Anion gap: 17 — ABNORMAL HIGH (ref 5–15)
BUN: 25 mg/dL — ABNORMAL HIGH (ref 6–20)
CO2: 23 mmol/L (ref 22–32)
Calcium: 8.6 mg/dL — ABNORMAL LOW (ref 8.9–10.3)
Chloride: 98 mmol/L (ref 98–111)
Creatinine, Ser: 1.43 mg/dL — ABNORMAL HIGH (ref 0.61–1.24)
GFR, Estimated: 59 mL/min — ABNORMAL LOW (ref >60)
Glucose, Bld: 434 mg/dL — ABNORMAL HIGH (ref 70–99)
Potassium: 3.8 mmol/L (ref 3.5–5.1)
Sodium: 138 mmol/L (ref 135–145)

## 2022-07-07 LAB — URINALYSIS, ROUTINE W REFLEX MICROSCOPIC
Bilirubin Urine: NEGATIVE
Glucose, UA: >=500 mg/dL — AB
Ketones, ur: 20 mg/dL — AB
Leukocytes,Ua: NEGATIVE
Nitrite: NEGATIVE
Protein, ur: >=300 mg/dL — AB
Specific Gravity, Urine: 1.026 (ref 1.005–1.030)
pH: 5 (ref 5.0–8.0)

## 2022-07-07 LAB — BLOOD GAS, VENOUS
Acid-Base Excess: 1 mmol/L (ref 0.0–2.0)
Bicarbonate: 26.6 mmol/L (ref 20.0–28.0)
Drawn by: 1528
O2 Saturation: 74.3 %
Patient temperature: 37.1
pCO2, Ven: 45 mmHg (ref 44–60)
pH, Ven: 7.38 (ref 7.25–7.43)
pO2, Ven: 44 mmHg (ref 32–45)

## 2022-07-07 LAB — CBG MONITORING, ED: Glucose-Capillary: 397 mg/dL — ABNORMAL HIGH (ref 70–99)

## 2022-07-07 MED ORDER — LOPERAMIDE HCL 2 MG PO CAPS
4.0000 mg | ORAL_CAPSULE | Freq: Once | ORAL | Status: AC
  Administered 2022-07-07: 4 mg via ORAL
  Filled 2022-07-07: qty 2

## 2022-07-07 MED ORDER — INSULIN ASPART 100 UNIT/ML IV SOLN
10.0000 [IU] | Freq: Once | INTRAVENOUS | Status: AC
  Administered 2022-07-07: 10 [IU] via INTRAVENOUS

## 2022-07-07 MED ORDER — PROCHLORPERAZINE EDISYLATE 10 MG/2ML IJ SOLN
10.0000 mg | Freq: Once | INTRAMUSCULAR | Status: AC
  Administered 2022-07-07: 10 mg via INTRAVENOUS
  Filled 2022-07-07: qty 2

## 2022-07-07 MED ORDER — LACTATED RINGERS IV BOLUS
1000.0000 mL | Freq: Once | INTRAVENOUS | Status: AC
  Administered 2022-07-07: 1000 mL via INTRAVENOUS

## 2022-07-07 MED ORDER — PROCHLORPERAZINE MALEATE 10 MG PO TABS: 10.0000 mg | ORAL_TABLET | Freq: Four times a day (QID) | ORAL | 0 refills | Status: DC | PRN

## 2022-07-07 NOTE — ED Provider Notes (Signed)
[facility] Provider Note   CSN: [id number] Arrival date & time: 07/06/22  2120     History  Chief Complaint  Patient presents with   Emesis   Diarrhea    [name] [name] is a 52 y.o. male.  The history is provided by the patient.  Emesis Associated symptoms: diarrhea   Diarrhea Associated symptoms: vomiting   He has history of hypertension, diabetes, hyperlipidemia, COPD, coronary artery disease, asthma and comes in because of vomiting and diarrhea which started this morning.  He cannot tell me how many times he has had emesis or diarrhea, but states that he feels a pale about halfway up.  He denies fever, chills, sweats.  He has had some abdominal soreness but no true abdominal pain.  He denies any sick contacts and denies any suspect food exposures.   Home Medications Prior to Admission medications   Medication Sig Start Date End Date Taking? Authorizing Provider  acetaminophen (TYLENOL) 500 MG tablet Take 1,500 mg by mouth in the morning and at bedtime.    [provider]  albuterol (VENTOLIN HFA) 108 (90 Base) MCG/ACT inhaler INHALE 2 PUFFS BY MOUTH EVERY 6 HOURS AS NEEDED FOR COUGHING, WHEEZING, OR SHORTNESS OF BREATH 04/07/22   Soyla Dryer, PA-C  ALPRAZolam Duanne Moron) 0.5 MG tablet Take 0.25-0.5 mg by mouth 3 (three) times daily as needed.    [provider]  ARIPiprazole (ABILIFY) 10 MG tablet Take 10 mg by mouth daily.    [provider]  aspirin EC 81 MG tablet Take 1 tablet by mouth daily with breakfast. 02/14/21   [provider]  atorvastatin (LIPITOR) 80 MG tablet TAKE 1 Tablet BY MOUTH ONCE EVERY DAY 04/07/22   Soyla Dryer, PA-C  BD PEN NEEDLE MICRO U/F 32G X 6 MM MISC See admin instructions. 05/05/22   [provider]  budesonide-formoterol (SYMBICORT) 80-4.5 MCG/ACT inhaler Take 2 puffs first thing in am and then another 2 puffs about 12 hours later. 06/11/22   Tanda Rockers, MD  Continuous Blood Gluc Receiver (DEXCOM G7 RECEIVER) DEVI Use to monitor BG continuously 02/02/22   Cassandria Anger, MD  Continuous Blood Gluc Sensor (DEXCOM G7 SENSOR) MISC Change sensor every 10 days 02/02/22   Cassandria Anger, MD  famotidine (PEPCID) 40 MG tablet Take 1 tablet (40 mg total) by mouth 2 (two) times daily. 05/26/21   Soyla Dryer, PA-C  gabapentin (NEURONTIN) 300 MG capsule Take 1 capsule (300 mg total) by mouth every 8 (eight) hours as needed. 10/22/21 01/22/23  Wyvonnia Dusky, MD  insulin aspart protamine - aspart (NOVOLOG MIX 70/30 FLEXPEN) (70-30) 100 UNIT/ML FlexPen Inject 80 units with breakfast and 60 units with supper when pre-meal glucose is above 90 02/23/22   Nida, Marella Chimes, MD  isosorbide mononitrate (IMDUR) 30 MG 24 hr tablet TAKE 1 Tablet BY MOUTH ONCE EVERY DAY    [provider]  linaclotide (LINZESS) 145 MCG CAPS capsule Take 1 capsule (145 mcg total) by mouth daily before breakfast. 06/21/22 06/16/23  Cirigliano, Vito V, DO  metFORMIN (GLUCOPHAGE) 500 MG tablet TAKE 1 Tablet  BY MOUTH TWICE DAILY WITH A MEAL 04/07/22   Nida, Marella Chimes, MD  midodrine (PROAMATINE) 5 MG tablet Take 1 tablet (5 mg total) by mouth 3 (three) times daily with meals. 04/28/22   Satira Sark, MD  nitroGLYCERIN (NITROSTAT) 0.4 MG SL tablet Place 1 tablet (0.4 mg total) under the tongue every  5 (five) minutes as needed for chest pain. 10/05/17   [name], PA-C  ondansetron (ZOFRAN) 4 MG tablet Take 4 mg by mouth as needed.    [provider]  polyethylene glycol (MIRALAX / GLYCOLAX) 17 g packet Take 17 g by mouth daily. Patient taking differently: Take 17 g by mouth daily as needed for moderate constipation. 11/11/20   Gilda Crease, MD  promethazine (PHENERGAN) 12.5 MG suppository Place 12.5 mg rectally every 6 (six) hours as needed for nausea or vomiting.    [provider]  promethazine (PHENERGAN) 12.5 MG tablet Take  12.5 mg by mouth every 6 (six) hours as needed for nausea or vomiting.    [provider]  sertraline (ZOLOFT) 100 MG tablet Take 1 tablet by mouth daily. 01/09/20   [provider]  traZODone (DESYREL) 50 MG tablet Take 50 mg by mouth at bedtime. 10/03/20   [provider]  Vonoprazan Fumarate 20 MG TABS Take 20 mg by mouth daily. Take 20 mg by mouth daily. For 8 weeks, then take 10 mg daily. 06/21/22   Cirigliano, Vito V, DO      Allergies    Omeprazole magnesium, Bee venom, Esomeprazole, and Morphine and related    Review of Systems   Review of Systems  Gastrointestinal:  Positive for diarrhea and vomiting.  All other systems reviewed and are negative.   Physical Exam Updated Vital Signs BP (!) 183/92   Pulse (!) 109   Temp 98.7 F (37.1 C) (Oral)   Resp 18   Ht 5\' 8"  (1.727 m)   Wt 66.2 kg   SpO2 95%   BMI 22.20 kg/m  Physical Exam Vitals and nursing note reviewed.   52 year old male, resting comfortably and in no acute distress. Vital signs are significant for elevated heart rate and blood pressure. Oxygen saturation is 95%, which is normal. Head is normocephalic and atraumatic. PERRLA, EOMI. Oropharynx is clear.  Mucous membranes are dry. Neck is nontender and supple without adenopathy or JVD. Back is nontender and there is no CVA tenderness. Lungs are clear without rales, wheezes, or rhonchi. Chest is nontender. Heart has regular rate and rhythm without murmur. Abdomen is soft, flat, nontender.  Peristalsis is hypoactive. Extremities have no cyanosis or edema, full range of motion is present. Skin is warm and dry without rash. Neurologic: Mental status is normal, cranial nerves are intact, moves all extremities equally.  ED Results / Procedures / Treatments   Labs (all labs ordered are listed, but only abnormal results are displayed) Labs Reviewed  COMPREHENSIVE METABOLIC PANEL - Abnormal; Notable for the following components:      Result  Value   Sodium 133 (*)    Chloride 93 (*)    Glucose, Bld 483 (*)    BUN 25 (*)    Creatinine, Ser 1.46 (*)    Total Protein 8.4 (*)    Alkaline Phosphatase 147 (*)    Total Bilirubin 1.4 (*)    GFR, Estimated 58 (*)    Anion gap 18 (*)    All other components within normal limits  CBC - Abnormal; Notable for the following components:   WBC 15.9 (*)    Hemoglobin 17.1 (*)    All other components within normal limits  URINALYSIS, ROUTINE W REFLEX MICROSCOPIC - Abnormal; Notable for the following components:   Color, Urine STRAW (*)    Glucose, UA >=500 (*)    Hgb urine dipstick MODERATE (*)  Ketones, ur 20 (*)    Protein, ur >=300 (*)    Bacteria, UA RARE (*)    All other components within normal limits  BASIC METABOLIC PANEL - Abnormal; Notable for the following components:   Glucose, Bld 434 (*)    BUN 25 (*)    Creatinine, Ser 1.43 (*)    Calcium 8.6 (*)    GFR, Estimated 59 (*)    Anion gap 17 (*)    All other components within normal limits  CBG MONITORING, ED - Abnormal; Notable for the following components:   Glucose-Capillary 397 (*)    All other components within normal limits  LIPASE, BLOOD  BLOOD GAS, VENOUS   Procedures Procedures    Medications Ordered in ED Medications  lactated ringers bolus 1,000 mL (has no administration in time range)  prochlorperazine (COMPAZINE) injection 10 mg (has no administration in time range)  loperamide (IMODIUM) capsule 4 mg (has no administration in time range)  lactated ringers bolus 1,000 mL (0 mLs Intravenous Stopped 07/07/22 0119)  ondansetron (ZOFRAN) injection 4 mg (4 mg Intravenous Given 07/06/22 2346)    ED Course/ Medical Decision Making/ A&P                             Medical Decision Making Amount and/or Complexity of Data Reviewed Labs: ordered.  Risk OTC drugs. Prescription drug management.   Nausea, vomiting, diarrhea.  Food poisoning versus viral gastroenteritis.  Doubt bowel obstruction,  diverticulitis, other serious intra-abdominal pathology.  Prior to my seeing him, he had received some IV fluids, ondansetron without any improvement.  I have ordered additional IV fluids, prochlorperazine, loperamide.  I have reviewed and interpreted his laboratory tests, and my interpretation is mild increase in BUN and creatinine compared with 02/21/2022, hyperglycemia with mild hyponatremia appropriate to degree of hyperglycemia, mild elevation of alkaline phosphatase of uncertain significance, mild elevation of bilirubin of uncertain significance.  WBC is mildly elevated, hemoglobin increased compared with 06/18/2022.  CO2 is normal, but anion gap is slightly elevated.  I have low index of suspicion for ketoacidosis, but will check VBG and beta hydroxybutyrate and give single dose of IV insulin.  Urine venous blood gas shows no evidence of acidosis.  Repeat basic metabolic panel showed improvement in anion gap and renal function.  Following insulin, CBG came down to 397.  He was tolerating oral fluids and was felt to be safe for discharge.  I am discharging him with a prescription for prochlorperazine and told him to take over-the-counter loperamide as needed for diarrhea.  Return precautions discussed.  CRITICAL CARE Performed by: Delora Fuel Total critical care time: 35 minutes Critical care time was exclusive of separately billable procedures and treating other patients. Critical care was necessary to treat or prevent imminent or life-threatening deterioration. Critical care was time spent personally by me on the following activities: development of treatment plan with patient and/or surrogate as well as nursing, discussions with consultants, evaluation of patient's response to treatment, examination of patient, obtaining history from patient or surrogate, ordering and performing treatments and interventions, ordering and review of laboratory studies, ordering and review of radiographic studies, pulse  oximetry and re-evaluation of patient's condition.  Final Clinical Impression(s) / ED Diagnoses Final diagnoses:  Nausea vomiting and diarrhea  Renal insufficiency    Rx / DC Orders ED Discharge Orders          Ordered    prochlorperazine (COMPAZINE) 10 MG  tablet  Every 6 hours PRN        07/07/22 5573              Delora Fuel, MD [birth year] [phone]

## 2022-07-07 NOTE — Discharge Instructions (Addendum)
Take loperamide (Imodium A-D) as needed for diarrhea.  Return if symptoms or not being adequately controlled at home.

## 2022-07-07 NOTE — ED Notes (Signed)
Pt tolerating water and ice chips.

## 2022-07-17 NOTE — Progress Notes (Unsigned)
[facility] [place], [place] [id number]   CLINIC:  Medical Oncology/Hematology  PCP:  Shanon Rosser, PA-C [address] [place] [id number] [phone]   REASON FOR VISIT:  Follow-up for leukocytosis  PRIOR THERAPY: None  CURRENT THERAPY: Under workup  INTERVAL HISTORY:   Mr. Dietl 52 y.o. male returns for routine follow-up of leukocytosis.  He was seen for initial consultation by Dr. Delton Coombes on 06/18/2022.  At today's visit, he reports feeling somewhat poorly due to his multiple chronic health conditions.  He denies any changes in his symptoms or baseline health status since his initial visit with Dr. Delton Coombes last month.  He continues to deny any recent infections.  No use of systemic steroids, but uses Symbicort (steroid containing) inhaler daily.  No B symptoms reported.  He denies any new lumps or bumps.  He has little to no energy and 60% appetite. He endorses that he is maintaining a stable weight.   ASSESSMENT & PLAN:  1.  Neutrophilic leukocytosis - Patient seen at the request of Scott Long, PA-C - Intermittent leukocytosis (neutrophil predominant) since 2017 - No personal history of connective tissue disorders. - Current everyday smoker (0.5 PPD) - Uses a Symbicort inhaler daily.   - No B symptoms or recurrent infections. - CTAP (11/29/2021): Spleen appears normal. - Hematology workup (06/18/2022):  Elevated ESR (20).  Normal LDH, CRP. Negative JAK2 with reflex to CALR, MPL, Exon 12-15 Negative BCR/ABL FISH - Most recent CBC (06/18/2022): WBC 13.1, ANC 9.4, otherwise normal CBC and differential - No lymphadenopathy or palpable splenomegaly on exam  - PLAN: MPN workup negative, no evidence of malignant leukocytosis at this time. - Differential diagnosis favors reactive leukocytosis in the setting of tobacco use, steroid containing inhaler, and chronic pro inflammatory state. - We will check labs (CBC/D) with office visit  in 6 months, followed by annual visit versus discharge to PCP.    2.  Other history - Other PMH includes coronary artery disease, COPD, type 2 diabetes mellitus, sleep apnea, hypertension, hyperlipidemia, arthritis, anxiety/depression - He lives at home with his father.  Trying to get disability.  He previously worked at [employer] at [employer] [place].  He is current active smoker, half pack per day for 38 years. - Mother had breast cancer and mantle cell lymphoma. - Maternal grandmother had breast cancer.  Maternal aunt had breast cancer.  PLAN SUMMARY: >> Labs in 6 months (CBC/D only) + same-day OFFICE visit     REVIEW OF SYSTEMS:   Review of Systems  Constitutional:  Positive for fatigue. Negative for appetite change, chills, diaphoresis, fever and unexpected weight change.  HENT:   Positive for trouble swallowing (difficulty chewing due to teeth). Negative for lump/mass and nosebleeds.   Eyes:  Negative for eye problems.  Respiratory:  Positive for cough and shortness of breath. Negative for hemoptysis.   Cardiovascular:  Negative for chest pain, leg swelling and palpitations.  Gastrointestinal:  Positive for diarrhea, nausea and vomiting. Negative for abdominal pain, blood in stool and constipation.  Genitourinary:  Positive for frequency. Negative for hematuria.   Skin: Negative.   Neurological:  Positive for headaches and numbness. Negative for dizziness and light-headedness.  Hematological:  Does not bruise/bleed easily.  Psychiatric/Behavioral:  Positive for depression and sleep disturbance. The patient is nervous/anxious.      PHYSICAL EXAM:  ECOG PERFORMANCE STATUS: 2 - Symptomatic, <50% confined to bed  There were no vitals filed for this visit. There were  no vitals filed for this visit. Physical Exam Constitutional:      Appearance: Normal appearance.  HENT:     Head: Normocephalic and atraumatic.     Mouth/Throat:     Mouth: Mucous membranes are moist.  Eyes:      Extraocular Movements: Extraocular movements intact.     Pupils: Pupils are equal, round, and reactive to light.  Cardiovascular:     Rate and Rhythm: Normal rate and regular rhythm.     Pulses: Normal pulses.     Heart sounds: Normal heart sounds.  Pulmonary:     Effort: Pulmonary effort is normal.     Breath sounds: Normal breath sounds.  Abdominal:     General: Bowel sounds are normal.     Palpations: Abdomen is soft.     Tenderness: There is no abdominal tenderness.  Musculoskeletal:        General: No swelling.     Right lower leg: No edema.     Left lower leg: No edema.  Lymphadenopathy:     Cervical: No cervical adenopathy.  Skin:    General: Skin is warm and dry.  Neurological:     General: No focal deficit present.     Mental Status: He is alert and oriented to person, place, and time.     Gait: Gait abnormal (gait instability noted).  Psychiatric:        Mood and Affect: Mood normal.        Behavior: Behavior normal.     PAST MEDICAL/SURGICAL HISTORY:  Past Medical History:  Diagnosis Date   Anxiety    Arthritis    Asthma    CAD (coronary artery disease)    Moderate LAD disease 2016 - Dr. 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, bilateral    Sleep apnea 2016   Type 2 diabetes mellitus ([place])    Past Surgical History:  Procedure Laterality Date   ANKLE SURGERY Right 1982   "had extra bones in there; took them out"   [place]  12/26/2018   Procedure: BIOPSY;  Surgeon: Rogene Houston, MD;  [facility];  Service: Endoscopy;;  duodenal biopsies   BIOPSY  03/23/2019   Procedure: BIOPSY;  Surgeon: Rogene Houston, MD;  [facility];   Service: Endoscopy;;  esophagus   CARDIAC CATHETERIZATION N/A 03/28/2015   Procedure: Left Heart Cath and Coronary Angiography;  Surgeon: Adrian Prows, MD;  Location: [place];  Service: Cardiovascular;  Laterality: N/A;   CARDIAC CATHETERIZATION N/A 03/28/2015   Procedure: Intravascular Pressure Wire/FFR Study;  Surgeon: Adrian Prows, MD;  Location: [place];  Service: Cardiovascular;  Laterality: N/A;   CARPAL TUNNEL RELEASE Left ~ 2008   COLONOSCOPY WITH ESOPHAGOGASTRODUODENOSCOPY (EGD)     COLONOSCOPY WITH PROPOFOL N/A 12/24/2019   Procedure: COLONOSCOPY WITH PROPOFOL;  Surgeon: Rogene Houston, MD;  [facility];  Service: Endoscopy;  Laterality: N/A;  955   ELBOW FRACTURE SURGERY Left ~ 2008   ESOPHAGEAL MANOMETRY N/A 09/16/2021   Procedure: ESOPHAGEAL MANOMETRY (EM);  Surgeon: Lavena Bullion, DO;  [facility];  Service: Endoscopy;  Laterality: N/A;   ESOPHAGOGASTRODUODENOSCOPY N/A 12/26/2018   Procedure: ESOPHAGOGASTRODUODENOSCOPY (EGD);  Surgeon: Rogene Houston, MD;  [facility];  Service: Endoscopy;  Laterality: N/A;   ESOPHAGOGASTRODUODENOSCOPY (EGD) WITH PROPOFOL N/A 03/23/2019   Procedure: ESOPHAGOGASTRODUODENOSCOPY (EGD) WITH PROPOFOL;  Surgeon: Rogene Houston, MD;  [facility];  Service: Endoscopy;  Laterality: N/A;  7:30   FRACTURE SURGERY     ankle and elbow   [place] IMPEDANCE STUDY N/A 09/16/2021   Procedure: [place] IMPEDANCE STUDY;  Surgeon: Lavena Bullion, DO;  [facility];  Service: Endoscopy;  Laterality: N/A;   PILONIDAL CYST EXCISION N/A 03/24/2017   Procedure: EXCISION CHRONIC  PILONIDAL ABSCESS;  Surgeon: Coralie Keens, MD;  Location[facility];  Service: General;  Laterality: N/A;   SCROTAL EXPLORATION N/A 11/13/2020   Procedure: EXCISION OF SEBACEOUS CYSTS, SCROTUM;  Surgeon: Cleon Gustin, MD;  Location: [facility];  Service: Urology;  Laterality: N/A;   TENDON REPAIR Left ~ 2004   "main  tendon in my ankle"    SOCIAL HISTORY:  Social History   Socioeconomic History   Marital status: Single    Spouse name: Not on file   Number of children: 0   Years of education: 10th g   Highest education level: Not on file  Occupational History   Occupation: [employer] farm manager  Tobacco Use   Smoking status: Every Day    Packs/day: 1.00    Years: 34.00    Total pack years: 34.00    Types: Cigarettes    Passive exposure: Never   Smokeless tobacco: Never   Tobacco comments:    1/2 pack a day  Vaping Use   Vaping Use: Never used  Substance and Sexual Activity   Alcohol use: Never    Alcohol/week: 0.0 standard drinks of alcohol   Drug use: Never   Sexual activity: Not Currently  Other Topics Concern   Not on file  Social History Narrative   His main caretaker, his mother has terminal metastatic cancer.   Social Determinants of Health   Financial Resource Strain: High Risk (06/24/2022)   Overall Financial Resource Strain (CARDIA)    Difficulty of Paying Living Expenses: Very hard  Food Insecurity: Food Insecurity Present (06/18/2022)   Hunger Vital Sign    Worried About Running Out of Food in the Last Year: Often true    Ran Out of Food in the Last Year: Often true  Transportation Needs: No Transportation Needs (06/18/2022)   PRAPARE - Hydrologist (Medical): No    Lack of Transportation (Non-Medical): No  Physical Activity: Not on file  Stress: Not on file  Social Connections: Not on file  Intimate Partner Violence: Not At Risk (06/18/2022)   Humiliation, Afraid, Rape, and Kick questionnaire    Fear of Current or Ex-Partner: No    Emotionally Abused: No    Physically Abused: No    Sexually Abused: No    FAMILY HISTORY:  Family History  Problem Relation Age of Onset   Diabetes Mother    Hypertension Mother    Cancer Mother    Throat cancer Mother    Diabetes Father    Hypertension Father    Hyperlipidemia Father    Congestive  Heart Failure Sister    Colon cancer Neg Hx    Pancreatic cancer Neg Hx    Liver disease Neg Hx     CURRENT MEDICATIONS:  Outpatient Encounter Medications as of 07/19/2022  Medication Sig   acetaminophen (TYLENOL) 500 MG tablet Take 1,500 mg by mouth in the morning and at bedtime.   albuterol (VENTOLIN HFA) 108 (90 Base) MCG/ACT inhaler INHALE 2 PUFFS BY MOUTH EVERY 6 HOURS AS NEEDED FOR COUGHING, WHEEZING, OR SHORTNESS OF BREATH   ALPRAZolam (XANAX) 0.5 MG tablet Take 0.25-0.5 mg by mouth 3 (three) times daily as needed.   ARIPiprazole (ABILIFY) 10 MG tablet Take 10 mg by mouth daily.   aspirin EC 81 MG tablet Take 1 tablet by mouth daily with breakfast.   atorvastatin (LIPITOR) 80 MG tablet TAKE 1 Tablet BY MOUTH ONCE EVERY DAY   BD PEN NEEDLE MICRO U/F 32G X 6 MM MISC See admin instructions.   budesonide-formoterol (SYMBICORT) 80-4.5 MCG/ACT inhaler Take 2 puffs first thing in am and then another 2 puffs about 12 hours later.   Continuous Blood Gluc Receiver (DEXCOM G7 RECEIVER) DEVI Use to monitor BG continuously   Continuous Blood Gluc Sensor (DEXCOM G7 SENSOR) MISC Change sensor every 10 days   famotidine (PEPCID) 40 MG tablet Take 1 tablet (40 mg total) by mouth 2 (two) times daily.   gabapentin (NEURONTIN) 300 MG capsule Take 1 capsule (300 mg total) by mouth every 8 (eight) hours as needed.   insulin aspart protamine - aspart (NOVOLOG MIX 70/30 FLEXPEN) (70-30) 100 UNIT/ML FlexPen Inject 80 units with breakfast and 60 units with supper when pre-meal glucose is above 90   isosorbide mononitrate (IMDUR) 30 MG 24 hr tablet TAKE 1 Tablet BY MOUTH ONCE EVERY DAY   linaclotide (LINZESS) 145 MCG CAPS capsule Take 1 capsule (145 mcg total) by mouth daily before breakfast.   metFORMIN (GLUCOPHAGE) 500 MG tablet TAKE 1 Tablet  BY MOUTH TWICE DAILY WITH A MEAL   midodrine (PROAMATINE) 5 MG tablet Take 1 tablet (5 mg total) by mouth 3 (three) times daily with meals.   nitroGLYCERIN (NITROSTAT)  0.4 MG SL tablet Place 1 tablet (0.4 mg total) under the tongue every 5 (five) minutes as needed for chest pain.   ondansetron (ZOFRAN) 4 MG tablet Take 4 mg by mouth as needed.   polyethylene glycol (MIRALAX / GLYCOLAX) 17 g packet Take 17 g by mouth daily. (Patient taking differently: Take 17 g by mouth daily as needed for moderate constipation.)   prochlorperazine (COMPAZINE) 10 MG tablet Take 1 tablet (10 mg total) by mouth every 6 (six) hours as needed for nausea or vomiting.   promethazine (PHENERGAN) 12.5 MG suppository Place 12.5 mg rectally every 6 (six) hours as needed for nausea or vomiting.   promethazine (PHENERGAN) 12.5 MG tablet Take 12.5 mg by mouth every 6 (six) hours as needed for nausea or vomiting.   sertraline (ZOLOFT) 100 MG tablet Take 1 tablet by mouth daily.   traZODone (DESYREL) 50 MG tablet Take 50 mg by mouth at bedtime.   Vonoprazan Fumarate 20 MG TABS Take 20 mg by mouth daily. Take 20 mg by mouth daily. For 8 weeks, then take 10 mg daily.   No facility-administered encounter medications on file as of 07/19/2022.    ALLERGIES:  Allergies  Allergen Reactions   Omeprazole Magnesium Swelling    Face swells, no breathing impairment   Bee Venom    Esomeprazole Swelling    Face swells, no breathing impairment   Morphine And Related     Itching at IV site, dry heaving, burning sensation in throat.    LABORATORY DATA:  I have  reviewed the labs as listed.  CBC    Component Value Date/Time   WBC 15.9 (H) 07/06/2022 2141   RBC 5.59 07/06/2022 2141   HGB 17.1 (H) 07/06/2022 2141   HCT 51.0 07/06/2022 2141   PLT 301 07/06/2022 2141   MCV 91.2 07/06/2022 2141   MCH 30.6 07/06/2022 2141   MCHC 33.5 07/06/2022 2141   RDW 13.0 07/06/2022 2141   LYMPHSABS 2.7 06/18/2022 0859   MONOABS 0.6 06/18/2022 0859   EOSABS 0.2 06/18/2022 0859   BASOSABS 0.1 06/18/2022 0859      Latest Ref Rng & Units 07/07/2022    2:10 AM 07/06/2022    9:41 PM 02/21/2022    5:31 PM  CMP   Glucose 70 - 99 mg/dL 434  483  348   BUN 6 - 20 mg/dL 25  25  20   $ Creatinine 0.61 - 1.24 mg/dL 1.43  1.46  1.22   Sodium 135 - 145 mmol/L 138  133  133   Potassium 3.5 - 5.1 mmol/L 3.8  4.9  5.5   Chloride 98 - 111 mmol/L 98  93  101   CO2 22 - 32 mmol/L 23  22  22   $ Calcium 8.9 - 10.3 mg/dL 8.6  9.0  8.5   Total Protein 6.5 - 8.1 g/dL  8.4  6.5   Total Bilirubin 0.3 - 1.2 mg/dL  1.4  0.6   Alkaline Phos 38 - 126 U/L  147  89   AST 15 - 41 U/L  22  13   ALT 0 - 44 U/L  20  12     DIAGNOSTIC IMAGING:  I have independently reviewed the relevant imaging and discussed with the patient.   WRAP UP:  All questions were answered. The patient knows to call the clinic with any problems, questions or concerns.  Medical decision making: Low  Time spent on visit: I spent 20 minutes counseling the patient face to face. The total time spent in the appointment was 30 minutes and more than 50% was on counseling.  Harriett Rush, PA-C  07/19/22 9:11 AM

## 2022-07-19 ENCOUNTER — Inpatient Hospital Stay: Payer: Medicaid Other | Attending: Hematology | Admitting: Physician Assistant

## 2022-07-19 ENCOUNTER — Other Ambulatory Visit: Payer: Self-pay

## 2022-07-19 VITALS — BP 119/81 | HR 97 | Temp 97.4°F | Resp 19 | Ht 68.0 in | Wt 144.0 lb

## 2022-07-19 DIAGNOSIS — K219 Gastro-esophageal reflux disease without esophagitis: Secondary | ICD-10-CM | POA: Diagnosis not present

## 2022-07-19 DIAGNOSIS — M1990 Unspecified osteoarthritis, unspecified site: Secondary | ICD-10-CM | POA: Diagnosis not present

## 2022-07-19 DIAGNOSIS — F419 Anxiety disorder, unspecified: Secondary | ICD-10-CM | POA: Diagnosis not present

## 2022-07-19 DIAGNOSIS — J4489 Other specified chronic obstructive pulmonary disease: Secondary | ICD-10-CM | POA: Diagnosis not present

## 2022-07-19 DIAGNOSIS — Z7982 Long term (current) use of aspirin: Secondary | ICD-10-CM | POA: Diagnosis not present

## 2022-07-19 DIAGNOSIS — D72828 Other elevated white blood cell count: Secondary | ICD-10-CM

## 2022-07-19 DIAGNOSIS — Z800 Family history of malignant neoplasm of digestive organs: Secondary | ICD-10-CM | POA: Diagnosis not present

## 2022-07-19 DIAGNOSIS — F17210 Nicotine dependence, cigarettes, uncomplicated: Secondary | ICD-10-CM | POA: Diagnosis not present

## 2022-07-19 DIAGNOSIS — Z7951 Long term (current) use of inhaled steroids: Secondary | ICD-10-CM | POA: Insufficient documentation

## 2022-07-19 DIAGNOSIS — G4730 Sleep apnea, unspecified: Secondary | ICD-10-CM | POA: Diagnosis not present

## 2022-07-19 DIAGNOSIS — Z803 Family history of malignant neoplasm of breast: Secondary | ICD-10-CM | POA: Insufficient documentation

## 2022-07-19 DIAGNOSIS — I10 Essential (primary) hypertension: Secondary | ICD-10-CM | POA: Diagnosis not present

## 2022-07-19 DIAGNOSIS — F32A Depression, unspecified: Secondary | ICD-10-CM | POA: Diagnosis not present

## 2022-07-19 DIAGNOSIS — R700 Elevated erythrocyte sedimentation rate: Secondary | ICD-10-CM | POA: Diagnosis not present

## 2022-07-19 DIAGNOSIS — I2510 Atherosclerotic heart disease of native coronary artery without angina pectoris: Secondary | ICD-10-CM | POA: Insufficient documentation

## 2022-07-19 DIAGNOSIS — E785 Hyperlipidemia, unspecified: Secondary | ICD-10-CM | POA: Diagnosis not present

## 2022-07-19 DIAGNOSIS — Z79899 Other long term (current) drug therapy: Secondary | ICD-10-CM | POA: Diagnosis not present

## 2022-07-19 DIAGNOSIS — E1142 Type 2 diabetes mellitus with diabetic polyneuropathy: Secondary | ICD-10-CM | POA: Diagnosis not present

## 2022-07-19 NOTE — Patient Instructions (Signed)
[facility] **   You were seen today by Tarri Abernethy PA-C for your elevated white blood cells.   Your labs did not NOT show any sign of white blood cell cancer or genetic mutation. Your white blood cells are most likely elevated due to inflammation from smoking, as well as in response to your Symbicort inhaler.  FOLLOW-UP APPOINTMENT: We will check labs and see you for an office visit in 2 months  ** Thank you for trusting me with your healthcare!  I strive to provide all of my patients with quality care at each visit.  If you receive a survey for this visit, I would be so grateful to you for taking the time to provide feedback.  Thank you in advance!  ~ Alson Mcpheeters                   Dr. Derek Jack   &   Tarri Abernethy, PA-C   - - - - - - - - - - - - - - - - - -    Thank you for choosing [facility] to provide your oncology and hematology care.  To afford each patient quality time with our provider, please arrive at least 15 minutes before your scheduled appointment time.   If you have a lab appointment with the [facility] please come in thru the Main Entrance and check in at the main information desk.  You need to re-schedule your appointment should you arrive 10 or more minutes late.  We strive to give you quality time with our providers, and arriving late affects you and other patients whose appointments are after yours.  Also, if you no show three or more times for appointments you may be dismissed from the clinic at the providers discretion.     Again, thank you for choosing [facility].  Our hope is that these requests will decrease the amount of time that you wait before being seen by our physicians.       _____________________________________________________________  Should you have questions after your visit to [facility],  please contact our office at [phone] and follow the prompts.  Our office hours are 8:00 a.m. and 4:30 p.m. Monday - Friday.  Please note that voicemails left after 4:00 p.m. may not be returned until the following business day.  We are closed weekends and major holidays.  You do have access to a nurse 24-7, just call the main number to the clinic [phone] and do not press any options, hold on the line and a nurse will answer the phone.    For prescription refill requests, have your pharmacy contact our office and allow 72 hours.

## 2022-07-20 ENCOUNTER — Ambulatory Visit: Payer: Medicaid Other | Attending: Physician Assistant | Admitting: Pulmonary Disease

## 2022-07-20 DIAGNOSIS — R0681 Apnea, not elsewhere classified: Secondary | ICD-10-CM | POA: Diagnosis not present

## 2022-07-20 DIAGNOSIS — R0683 Snoring: Secondary | ICD-10-CM | POA: Diagnosis not present

## 2022-07-26 ENCOUNTER — Emergency Department (HOSPITAL_COMMUNITY)
Admission: EM | Admit: 2022-07-26 | Discharge: 2022-07-26 | Disposition: A | Discharge status: Home / Self Care | Payer: Medicaid Other | Attending: Emergency Medicine | Admitting: Emergency Medicine

## 2022-07-26 ENCOUNTER — Encounter (HOSPITAL_COMMUNITY): Payer: Self-pay | Admitting: Emergency Medicine

## 2022-07-26 DIAGNOSIS — E1165 Type 2 diabetes mellitus with hyperglycemia: Secondary | ICD-10-CM | POA: Insufficient documentation

## 2022-07-26 DIAGNOSIS — J449 Chronic obstructive pulmonary disease, unspecified: Secondary | ICD-10-CM | POA: Insufficient documentation

## 2022-07-26 DIAGNOSIS — Z794 Long term (current) use of insulin: Secondary | ICD-10-CM | POA: Insufficient documentation

## 2022-07-26 DIAGNOSIS — Z7982 Long term (current) use of aspirin: Secondary | ICD-10-CM | POA: Insufficient documentation

## 2022-07-26 DIAGNOSIS — I10 Essential (primary) hypertension: Secondary | ICD-10-CM | POA: Diagnosis not present

## 2022-07-26 DIAGNOSIS — I2510 Atherosclerotic heart disease of native coronary artery without angina pectoris: Secondary | ICD-10-CM | POA: Insufficient documentation

## 2022-07-26 DIAGNOSIS — Z7951 Long term (current) use of inhaled steroids: Secondary | ICD-10-CM | POA: Diagnosis not present

## 2022-07-26 DIAGNOSIS — Z79899 Other long term (current) drug therapy: Secondary | ICD-10-CM | POA: Insufficient documentation

## 2022-07-26 DIAGNOSIS — Z7984 Long term (current) use of oral hypoglycemic drugs: Secondary | ICD-10-CM | POA: Insufficient documentation

## 2022-07-26 DIAGNOSIS — R739 Hyperglycemia, unspecified: Secondary | ICD-10-CM

## 2022-07-26 LAB — CBC WITH DIFFERENTIAL/PLATELET
Abs Immature Granulocytes: 0.02 10*3/uL (ref 0.00–0.07)
Basophils Absolute: 0.1 10*3/uL (ref 0.0–0.1)
Basophils Relative: 1 %
Eosinophils Absolute: 0.2 10*3/uL (ref 0.0–0.5)
Eosinophils Relative: 2 %
HCT: 41.9 % (ref 39.0–52.0)
Hemoglobin: 14.2 g/dL (ref 13.0–17.0)
Immature Granulocytes: 0 %
Lymphocytes Relative: 31 %
Lymphs Abs: 2.4 10*3/uL (ref 0.7–4.0)
MCH: 31.3 pg (ref 26.0–34.0)
MCHC: 33.9 g/dL (ref 30.0–36.0)
MCV: 92.3 fL (ref 80.0–100.0)
Monocytes Absolute: 0.4 10*3/uL (ref 0.1–1.0)
Monocytes Relative: 5 %
Neutro Abs: 4.7 10*3/uL (ref 1.7–7.7)
Neutrophils Relative %: 61 %
Platelets: 263 10*3/uL (ref 150–400)
RBC: 4.54 MIL/uL (ref 4.22–5.81)
RDW: 12.8 % (ref 11.5–15.5)
WBC: 7.7 10*3/uL (ref 4.0–10.5)
nRBC: 0 % (ref 0.0–0.2)

## 2022-07-26 LAB — URINALYSIS, ROUTINE W REFLEX MICROSCOPIC
Bacteria, UA: NONE SEEN
Bilirubin Urine: NEGATIVE
Glucose, UA: >=500 mg/dL — AB
Ketones, ur: NEGATIVE mg/dL
Leukocytes,Ua: NEGATIVE
Nitrite: NEGATIVE
Protein, ur: 30 mg/dL — AB
Specific Gravity, Urine: 1.025 (ref 1.005–1.030)
pH: 6 (ref 5.0–8.0)

## 2022-07-26 LAB — COMPREHENSIVE METABOLIC PANEL
ALT: 11 U/L (ref 0–44)
AST: 13 U/L — ABNORMAL LOW (ref 15–41)
Albumin: 2.8 g/dL — ABNORMAL LOW (ref 3.5–5.0)
Alkaline Phosphatase: 88 U/L (ref 38–126)
Anion gap: 10 (ref 5–15)
BUN: 16 mg/dL (ref 6–20)
CO2: 26 mmol/L (ref 22–32)
Calcium: 8 mg/dL — ABNORMAL LOW (ref 8.9–10.3)
Chloride: 95 mmol/L — ABNORMAL LOW (ref 98–111)
Creatinine, Ser: 1.12 mg/dL (ref 0.61–1.24)
GFR, Estimated: >60 mL/min (ref >60)
Glucose, Bld: 677 mg/dL (ref 70–99)
Potassium: 4.5 mmol/L (ref 3.5–5.1)
Sodium: 131 mmol/L — ABNORMAL LOW (ref 135–145)
Total Bilirubin: 0.9 mg/dL (ref 0.3–1.2)
Total Protein: 6 g/dL — ABNORMAL LOW (ref 6.5–8.1)

## 2022-07-26 LAB — BLOOD GAS, VENOUS
Acid-Base Excess: 4.5 mmol/L — ABNORMAL HIGH (ref 0.0–2.0)
Bicarbonate: 30.4 mmol/L — ABNORMAL HIGH (ref 20.0–28.0)
Drawn by: 4981
O2 Saturation: 56.8 %
Patient temperature: 36.9
pCO2, Ven: 49 mmHg (ref 44–60)
pH, Ven: 7.4 (ref 7.25–7.43)
pO2, Ven: <31 mmHg — CL (ref 32–45)

## 2022-07-26 LAB — CBG MONITORING, ED
Glucose-Capillary: >600 mg/dL (ref 70–99)
Glucose-Capillary: >600 mg/dL (ref 70–99)
Glucose-Capillary: 518 mg/dL (ref 70–99)
Glucose-Capillary: 147 mg/dL — ABNORMAL HIGH (ref 70–99)

## 2022-07-26 LAB — BETA-HYDROXYBUTYRIC ACID: Beta-Hydroxybutyric Acid: 0.09 mmol/L (ref 0.05–0.27)

## 2022-07-26 MED ORDER — LACTATED RINGERS IV SOLN: INTRAVENOUS | Status: DC

## 2022-07-26 MED ORDER — LACTATED RINGERS IV BOLUS
1000.0000 mL | Freq: Once | INTRAVENOUS | Status: AC
  Administered 2022-07-26: 1000 mL via INTRAVENOUS

## 2022-07-26 MED ORDER — LACTATED RINGERS IV BOLUS: 1000.0000 mL | Freq: Once | INTRAVENOUS | Status: DC

## 2022-07-26 MED ORDER — DEXTROSE 50 % IV SOLN: 0.0000 mL | INTRAVENOUS | Status: DC | PRN

## 2022-07-26 MED ORDER — LACTATED RINGERS IV BOLUS
1000.0000 mL | INTRAVENOUS | Status: AC
  Administered 2022-07-26 (×2): 1000 mL via INTRAVENOUS

## 2022-07-26 MED ORDER — INSULIN ASPART 100 UNIT/ML IV SOLN
10.0000 [IU] | Freq: Once | INTRAVENOUS | Status: AC
  Administered 2022-07-26: 10 [IU] via INTRAVENOUS

## 2022-07-26 MED ORDER — INSULIN REGULAR(HUMAN) IN NACL 100-0.9 UT/100ML-% IV SOLN
INTRAVENOUS | Status: DC
  Filled 2022-07-26: qty 100

## 2022-07-26 MED ORDER — DEXTROSE IN LACTATED RINGERS 5 % IV SOLN: INTRAVENOUS | Status: DC

## 2022-07-26 MED ORDER — POTASSIUM CHLORIDE 10 MEQ/100ML IV SOLN
10.0000 meq | INTRAVENOUS | Status: AC
  Administered 2022-07-26 (×2): 10 meq via INTRAVENOUS
  Filled 2022-07-26 (×2): qty 100

## 2022-07-26 NOTE — ED Provider Notes (Signed)
[facility] Provider Note   CSN: [id number] Arrival date & time: 07/26/22  1546     History  Chief Complaint  Patient presents with   Hyperglycemia    [name] [name] is a 52 y.o. male with a past medical history of type 2 diabetes, hypertension, anxiety, CAD, COPD, gout presenting today for evaluation of hyperglycemia.  Patient reports he checked his blood sugar at home this morning and was found to be in the 300s.  Patient reports being compliant with his hypoglycemic medications including NovoLog and metformin.  Patient reports he takes 80 units in the morning and 60 units in the evening.  Patient reports generalized weakness.  He denies fever, chest pain, shortness of breath, abdominal pain, nausea or vomiting.  Hyperglycemia   Past Medical History:  Diagnosis Date   Anxiety    Arthritis    Asthma    CAD (coronary artery disease)    Moderate LAD disease 2016 - Dr. 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, bilateral    Sleep apnea 2016   Type 2 diabetes mellitus ([place])    Past Surgical History:  Procedure Laterality Date   ANKLE SURGERY Right 1982   "had extra bones in there; took them out"   [place]  12/26/2018   Procedure: BIOPSY;  Surgeon: Rogene Houston, MD;  [facility];  Service: Endoscopy;;  duodenal biopsies   BIOPSY  03/23/2019   Procedure: BIOPSY;  Surgeon: Rogene Houston, MD;  [facility];  Service: Endoscopy;;  esophagus   CARDIAC CATHETERIZATION N/A 03/28/2015   Procedure: Left Heart Cath and Coronary Angiography;  Surgeon: Adrian Prows, MD;  Location: [facility];  Service:  Cardiovascular;  Laterality: N/A;   CARDIAC CATHETERIZATION N/A 03/28/2015   Procedure: Intravascular Pressure Wire/FFR Study;  Surgeon: Adrian Prows, MD;  Location: [facility];  Service: Cardiovascular;  Laterality: N/A;   CARPAL TUNNEL RELEASE Left ~ 2008   COLONOSCOPY WITH ESOPHAGOGASTRODUODENOSCOPY (EGD)     COLONOSCOPY WITH PROPOFOL N/A 12/24/2019   Procedure: COLONOSCOPY WITH PROPOFOL;  Surgeon: Rogene Houston, MD;  [facility];  Service: Endoscopy;  Laterality: N/A;  955   ELBOW FRACTURE SURGERY Left ~ 2008   ESOPHAGEAL MANOMETRY N/A 09/16/2021   Procedure: ESOPHAGEAL MANOMETRY (EM);  Surgeon: Lavena Bullion, DO;  [facility];  Service: Endoscopy;  Laterality: N/A;   ESOPHAGOGASTRODUODENOSCOPY N/A 12/26/2018   Procedure: ESOPHAGOGASTRODUODENOSCOPY (EGD);  Surgeon: Rogene Houston, MD;  [facility];  Service: Endoscopy;  Laterality: N/A;   ESOPHAGOGASTRODUODENOSCOPY (EGD) WITH PROPOFOL N/A 03/23/2019   Procedure: ESOPHAGOGASTRODUODENOSCOPY (EGD) WITH PROPOFOL;  Surgeon: Rogene Houston, MD;  [facility];  Service: Endoscopy;  Laterality: N/A;  7:30   FRACTURE SURGERY     ankle and elbow   [place] IMPEDANCE STUDY N/A 09/16/2021   Procedure: [place] IMPEDANCE STUDY;  Surgeon: Lavena Bullion, DO;  [facility];  Service: Endoscopy;  Laterality: N/A;   PILONIDAL CYST EXCISION N/A 03/24/2017   Procedure: EXCISION CHRONIC  PILONIDAL ABSCESS;  Surgeon: Coralie Keens, MD;  Location: [facility];  Service: General;  Laterality: N/A;   SCROTAL EXPLORATION N/A 11/13/2020   Procedure: EXCISION OF SEBACEOUS CYSTS, SCROTUM;  Surgeon: Cleon Gustin, MD;  Location: [facility];  Service: Urology;  Laterality: N/A;   TENDON REPAIR Left ~ 2004   "main tendon in my ankle"     Home Medications Prior to Admission medications   Medication Sig Start Date End Date Taking? Authorizing Provider  acetaminophen (TYLENOL) 500 MG tablet Take 1,500 mg  by mouth in the morning and at bedtime.   Yes [provider]  ALPRAZolam (XANAX) 0.5 MG tablet Take 0.25-0.5 mg by mouth 3 (three) times daily as needed for anxiety.   Yes [provider]  ARIPiprazole (ABILIFY) 10 MG tablet Take 10 mg by mouth daily.   Yes [provider]  aspirin EC 81 MG tablet Take 1 tablet by mouth daily with breakfast. 02/14/21  Yes [provider]  atorvastatin (LIPITOR) 80 MG tablet TAKE 1 Tablet BY MOUTH ONCE EVERY DAY Patient taking differently: Take 80 mg by mouth daily. 04/07/22  Yes Soyla Dryer, PA-C  budesonide-formoterol (SYMBICORT) 80-4.5 MCG/ACT inhaler Take 2 puffs first thing in am and then another 2 puffs about 12 hours later. Patient taking differently: Inhale 2 puffs into the lungs every 12 (twelve) hours. Take 2 puffs first thing in am and then another 2 puffs about 12 hours later. 06/11/22  Yes Tanda Rockers, MD  famotidine (PEPCID) 40 MG tablet Take 1 tablet (40 mg total) by mouth 2 (two) times daily. 05/26/21  Yes Soyla Dryer, PA-C  gabapentin (NEURONTIN) 300 MG capsule Take 1 capsule (300 mg total) by mouth every 8 (eight) hours as needed. Patient taking differently: Take 300 mg by mouth every 8 (eight) hours as needed (pain). 10/22/21 01/22/23 Yes Trifan, Carola Rhine, MD  insulin aspart protamine - aspart (NOVOLOG MIX 70/30 FLEXPEN) (70-30) 100 UNIT/ML FlexPen Inject 80 units with breakfast and 60 units with supper when pre-meal glucose is above 90 Patient taking differently: Inject 60-80 Units into the skin 2 (two) times daily with a meal. Inject 80 units with breakfast and 60 units with supper when pre-meal glucose is above 90 02/23/22  Yes Nida, Marella Chimes, MD  insulin degludec (TRESIBA FLEXTOUCH) 200 UNIT/ML FlexTouch Pen Inject 15 Units into the skin at bedtime.   Yes [provider]  isosorbide mononitrate (IMDUR) 30 MG 24 hr tablet Take 30 mg by mouth daily.   Yes [provider]   linaclotide Rolan Lipa) 145 MCG CAPS capsule Take 1 capsule (145 mcg total) by mouth daily before breakfast. 06/21/22 06/16/23 Yes Cirigliano, Vito V, DO  metFORMIN (GLUCOPHAGE) 500 MG tablet TAKE 1 Tablet  BY MOUTH TWICE DAILY WITH A MEAL Patient taking differently: Take 500 mg by mouth 2 (two) times daily with a meal. 04/07/22  Yes Nida, Marella Chimes, MD  midodrine (PROAMATINE) 5 MG tablet Take 1 tablet (5 mg total) by mouth 3 (three) times daily with meals. 04/28/22  Yes Satira Sark, MD  nitroGLYCERIN (NITROSTAT) 0.4 MG SL tablet Place 1 tablet (0.4 mg total) under the tongue every 5 (five) minutes as needed for chest pain. 10/05/17  Yes Soyla Dryer, PA-C  polyethylene glycol (MIRALAX / GLYCOLAX) 17 g packet Take 17 g by mouth daily. Patient taking differently: Take 17 g by mouth daily as needed for moderate constipation. 11/11/20  Yes Pollina, Gwenyth Allegra, MD  prochlorperazine (COMPAZINE) 10 MG tablet Take 1 tablet (  10 mg total) by mouth every 6 (six) hours as needed for nausea or vomiting. [id number]  Yes Delora Fuel, MD  sertraline (ZOLOFT) 100 MG tablet Take 1 tablet by mouth daily. 01/09/20  Yes [provider]  traZODone (DESYREL) 50 MG tablet Take 50 mg by mouth at bedtime. 10/03/20  Yes [provider]  Vonoprazan Fumarate 20 MG TABS Take 20 mg by mouth daily. Take 20 mg by mouth daily. For 8 weeks, then take 10 mg daily. 06/21/22  Yes Cirigliano, Vito V, DO  BD PEN NEEDLE MICRO U/F 32G X 6 MM MISC See admin instructions. 05/05/22   [provider]  Continuous Blood Gluc Receiver (DEXCOM G7 RECEIVER) DEVI Use to monitor BG continuously 02/02/22   Cassandria Anger, MD  Continuous Blood Gluc Sensor (DEXCOM G7 SENSOR) MISC Change sensor every 10 days 02/02/22   Cassandria Anger, MD      Allergies    Omeprazole magnesium, Bee venom, Esomeprazole, and Morphine and related    Review of Systems   Review of Systems Negative except as per HPI.  Physical  Exam Updated Vital Signs BP 118/79   Pulse 94   Temp 98.5 F (36.9 C) (Oral)   Resp 17   SpO2 98%  Physical Exam Vitals and nursing note reviewed.  Constitutional:      Appearance: Normal appearance. He is ill-appearing.  HENT:     Head: Normocephalic and atraumatic.     Mouth/Throat:     Mouth: Mucous membranes are moist.  Eyes:     General: No scleral icterus. Cardiovascular:     Rate and Rhythm: Normal rate and regular rhythm.     Pulses: Normal pulses.     Heart sounds: Normal heart sounds.  Pulmonary:     Effort: Pulmonary effort is normal.     Breath sounds: Normal breath sounds.  Abdominal:     General: Abdomen is flat.     Palpations: Abdomen is soft.     Tenderness: There is no abdominal tenderness.  Musculoskeletal:        General: No deformity.  Skin:    General: Skin is warm.     Findings: No rash.  Neurological:     General: No focal deficit present.     Mental Status: He is alert.  Psychiatric:        Mood and Affect: Mood normal.     ED Results / Procedures / Treatments   Labs (all labs ordered are listed, but only abnormal results are displayed) Labs Reviewed  COMPREHENSIVE METABOLIC PANEL - Abnormal; Notable for the following components:      Result Value   Sodium 131 (*)    Chloride 95 (*)    Glucose, Bld 677 (*)    Calcium 8.0 (*)    Total Protein 6.0 (*)    Albumin 2.8 (*)    AST 13 (*)    All other components within normal limits  URINALYSIS, ROUTINE W REFLEX MICROSCOPIC - Abnormal; Notable for the following components:   Color, Urine STRAW (*)    Glucose, UA >=500 (*)    Hgb urine dipstick SMALL (*)    Protein, ur 30 (*)    All other components within normal limits  BLOOD GAS, VENOUS - Abnormal; Notable for the following components:   pO2, Ven <31 (*)    Bicarbonate 30.4 (*)    Acid-Base Excess 4.5 (*)    All other components within normal limits  CBG MONITORING, ED -  Abnormal; Notable for the following components:    Glucose-Capillary >600 (*)    All other components within normal limits  CBG MONITORING, ED - Abnormal; Notable for the following components:   Glucose-Capillary >600 (*)    All other components within normal limits  CBG MONITORING, ED - Abnormal; Notable for the following components:   Glucose-Capillary 518 (*)    All other components within normal limits  CBG MONITORING, ED - Abnormal; Notable for the following components:   Glucose-Capillary 147 (*)    All other components within normal limits  CBC WITH DIFFERENTIAL/PLATELET  BETA-HYDROXYBUTYRIC ACID    EKG None  Radiology No results found.  Procedures Procedures    Medications Ordered in ED Medications  lactated ringers infusion (0 mLs Intravenous Hold 07/26/22 1838)  dextrose 5 % in lactated ringers infusion (0 mLs Intravenous Hold 07/26/22 1838)  dextrose 50 % solution 0-50 mL (has no administration in time range)  lactated ringers bolus 1,000 mL (1,000 mLs Intravenous Not Given 07/26/22 2117)  lactated ringers bolus 1,000 mL (0 mLs Intravenous Stopped 07/26/22 2134)  potassium chloride 10 mEq in 100 mL IVPB (0 mEq Intravenous Stopped 07/26/22 2056)  insulin aspart (novoLOG) injection 10 Units (10 Units Intravenous Given 07/26/22 1936)  lactated ringers bolus 1,000 mL (0 mLs Intravenous Stopped 07/26/22 2145)    ED Course/ Medical Decision Making/ A&P                             Medical Decision Making Amount and/or Complexity of Data Reviewed Labs: ordered.  Risk OTC drugs. Prescription drug management.   This patient presents to the ED for hyperglycemia, this involves an extensive number of treatment options, and is a complaint that carries with a high risk of complications and morbidity.  The differential diagnosis includes diabetes, HHS, DKA.  This is not an exhaustive list.  Lab tests: I ordered and personally interpreted labs.  The pertinent results include: WBC unremarkable. Hbg unremarkable. Platelets  unremarkable. Electrolytes with sodium 131.. BUN, creatinine unremarkable.  Bicarb 30.4.  Glucose 600.  Problem list/ ED course/ Critical interventions/ Medical management: HPI: See above Vital signs within normal range and stable throughout visit. Laboratory/imaging studies significant for: See above. On physical examination, patient is afebrile and appears in no acute distress. This patient presenting with apparent acute hyperglycemia. Considered DKA versus HHS, sepsis as possible etiologies of the patient's current presentation. However, given the current history & physical, including current lab values, the current presentation is consistent with acute, asymptomatic hyperglycemia with no signs of DKA or HHS.  Insulin and lactated Ringer's ordered.  Blood glucose decreased to 157.  Patient non toxic appearing with no signs of infection or ischemia.  He is able to tolerate p.o.  Patient advised to follow up with PCP for better blood sugar control. I have reviewed the patient home medicines and have made adjustments as needed.  Cardiac monitoring/EKG: The patient was maintained on a cardiac monitor.  I personally reviewed and interpreted the cardiac monitor which showed an underlying rhythm of: sinus rhythm.  Additional history obtained: External records from outside source obtained and reviewed including: Chart review including previous notes, labs, imaging.  Disposition Continued outpatient therapy. Follow-up with PCP recommended for reevaluation of symptoms. Treatment plan discussed with patient.  Pt acknowledged understanding was agreeable to the plan. Worrisome signs and symptoms were discussed with patient, and patient acknowledged understanding to return to the ED if they noticed  these signs and symptoms. Patient was stable upon discharge.   This chart was dictated using voice recognition software.  Despite best efforts to proofread,  errors can occur which can change the documentation  meaning.          Final Clinical Impression(s) / ED Diagnoses Final diagnoses:  Hyperglycemia    Rx / DC Orders ED Discharge Orders     None         Jeanelle Malling, [place] 07/26/22 2331    Eber Hong, MD 09/09/22 [phone]

## 2022-07-26 NOTE — ED Provider Triage Note (Signed)
Emergency Medicine Provider Triage Evaluation Note  [name] [name] , a 52 y.o. male  was evaluated in triage.  Pt complains of hypoglycemia.  Patient reports he checked his blood sugar at home and it was in the 300s.  Patient reports being compliant with his hypoglycemic medications including NovoLog and metformin.  States he is feeling fatigue.  Denies fever vomiting chest pain abdominal pain shortness of breath urinary symptoms.  Review of Systems  Positive: As above Negative: As above  Physical Exam  BP 101/74 (BP Location: Right Arm)   Pulse 98   Temp 98.5 F (36.9 C) (Oral)   Resp 19   SpO2 98%  Gen:   Awake, no distress   Resp:  Normal effort  MSK:   Moves extremities without difficulty  Other:    Medical Decision Making  Medically screening exam initiated at 5:19 PM.  Appropriate orders placed.  [name] [name] was informed that the remainder of the evaluation will be completed by another provider, this initial triage assessment does not replace that evaluation, and the importance of remaining in the ED until their evaluation is complete.    [name] [name], [place] 07/26/22 2336

## 2022-07-26 NOTE — ED Notes (Signed)
Glucose monitor reading HI--MD made aware

## 2022-07-26 NOTE — ED Triage Notes (Signed)
PT states his doctor told him to come over here due to glucose being high. PT states he checked it this morning and was 300 something. Blood work from [employer] office was taken Friday. RN asked why pcp sent for blood that was taken Friday. He states doc said he may be dehydrated and A1C high.  Rn asked if that doctor prescribes insulin and he states yes. Pt reports he takes 80 in morning and 60 in evening. Prescribed new insulin for nighttime he needs to go pick up.

## 2022-07-26 NOTE — Discharge Instructions (Addendum)
Please take your medications as prescribed. I recommend close follow-up with PCP and endocrinology and neurology for reevaluation.  Please do not hesitate to return to emergency department if worrisome signs symptoms we discussed become apparent.

## 2022-07-26 NOTE — ED Notes (Signed)
Date and time results received: 07/26/22 1758 (use smartphrase ".now" to insert current time)  Test: po2 Critical Value: less than 31  Name of Provider Notified: Dr Sabra Heck  Orders Received? Or Actions Taken?:  no orders at this time

## 2022-08-02 ENCOUNTER — Encounter: Payer: Self-pay | Admitting: Gastroenterology

## 2022-08-04 ENCOUNTER — Encounter: Payer: Self-pay | Admitting: Nurse Practitioner

## 2022-08-04 ENCOUNTER — Ambulatory Visit: Payer: Medicaid Other | Attending: Nurse Practitioner | Admitting: Nurse Practitioner

## 2022-08-04 VITALS — BP 132/84 | HR 101 | Ht 68.0 in | Wt 146.0 lb

## 2022-08-04 DIAGNOSIS — E782 Mixed hyperlipidemia: Secondary | ICD-10-CM | POA: Insufficient documentation

## 2022-08-04 DIAGNOSIS — R072 Precordial pain: Secondary | ICD-10-CM | POA: Diagnosis present

## 2022-08-04 DIAGNOSIS — I2510 Atherosclerotic heart disease of native coronary artery without angina pectoris: Secondary | ICD-10-CM | POA: Insufficient documentation

## 2022-08-04 DIAGNOSIS — Z720 Tobacco use: Secondary | ICD-10-CM | POA: Insufficient documentation

## 2022-08-04 DIAGNOSIS — I951 Orthostatic hypotension: Secondary | ICD-10-CM | POA: Diagnosis not present

## 2022-08-04 MED ORDER — MIDODRINE HCL 10 MG PO TABS: 10.0000 mg | ORAL_TABLET | Freq: Three times a day (TID) | ORAL | 3 refills | Status: DC

## 2022-08-04 NOTE — Patient Instructions (Signed)
Medication Instructions:  Your physician has recommended you make the following change in your medication:  -Increase Midodrine to 10 mg tablets three times daily  *If you need a refill on your cardiac medications before your next appointment, please call your pharmacy*   Lab Work: None If you have labs (blood work) drawn today and your tests are completely normal, you will receive your results only by: [place] (if you have MyChart) OR A paper copy in the mail If you have any lab test that is abnormal or we need to change your treatment, we will call you to review the results.   Testing/Procedures: None   Follow-Up: At [facility], you and your health needs are our priority.  As part of our continuing mission to provide you with exceptional heart care, we have created designated Provider Care Teams.  These Care Teams include your primary Cardiologist (physician) and Advanced Practice Providers (APPs -  Physician Assistants and Nurse Practitioners) who all work together to provide you with the care you need, when you need it.  We recommend signing up for the patient portal called "MyChart".  Sign up information is provided on this After Visit Summary.  MyChart is used to connect with patients for Virtual Visits (Telemedicine).  Patients are able to view lab/test results, encounter notes, upcoming appointments, etc.  Non-urgent messages can be sent to your provider as well.   To learn more about what you can do with MyChart, go to [email or web address].    Your next appointment:   3 month(s)  Provider:   You may see Rozann Lesches, MD or one of the following Advanced Practice Providers on your designated Care Team:   Bernerd Pho, PA-C  Ermalinda Barrios, [place]     Other Instructions

## 2022-08-04 NOTE — Progress Notes (Signed)
Office Visit    Patient Name: [name] [name] Date of Encounter: 08/04/2022  Primary Care Provider:  Shanon Rosser, PA-C Primary Cardiologist:  Rozann Lesches, MD  Chief Complaint    52 year old male with a history of nonobstructive CAD, hypertension, hyperlipidemia, diabetes, family history of premature CAD, tobacco abuse, and orthostasis, who presents for follow-up related to orthostatic lightheadedness, chest pain, and dyspnea.  Past Medical History    Past Medical History:  Diagnosis Date   Anxiety    Arthritis    Asthma    CAD (coronary artery disease)    a. 03/2015 Cath: LM nl, LAD mild diff dzs throughout w/ 50p/d, LCX diff dzs, 23m RCA diff dzs, 124m>Med rx; b. 12/2020 MV: EF 57%, small/mild apical inferior defect w/ partial rev/mild ischemia.   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, bilateral    Sleep apnea 2016   Type 2 diabetes mellitus (HCTyrone   Past Surgical History:  Procedure Laterality Date   ANKLE SURGERY Right 1982   "had extra bones in there; took them out"   [facility]   Procedure: BIOPSY;  Surgeon: ReRogene HoustonMD;  [facility];  Service: Endoscopy;;  duodenal biopsies   BIOPSY  03/23/2019   Procedure: BIOPSY;  Surgeon: ReRogene HoustonMD;  [facility];  Service: Endoscopy;;  esophagus   CARDIAC CATHETERIZATION N/A 03/28/2015   Procedure: Left Heart Cath and Coronary Angiography;  Surgeon: JaAdrian ProwsMD;  [facility];  Service: Cardiovascular;  Laterality: N/A;   CARDIAC CATHETERIZATION N/A 03/28/2015   Procedure: Intravascular Pressure Wire/FFR Study;  Surgeon: JaAdrian ProwsMD;  [facility];   Service: Cardiovascular;  Laterality: N/A;   CARPAL TUNNEL RELEASE Left ~ 2008   COLONOSCOPY WITH ESOPHAGOGASTRODUODENOSCOPY (EGD)     COLONOSCOPY WITH PROPOFOL N/A 12/24/2019   Procedure: COLONOSCOPY WITH PROPOFOL;  Surgeon: ReRogene HoustonMD;  [facility];  Service: Endoscopy;  Laterality: N/A;  955   ELBOW FRACTURE SURGERY Left ~ 2008   ESOPHAGEAL MANOMETRY N/A 09/16/2021   Procedure: ESOPHAGEAL MANOMETRY (EM);  Surgeon: CiLavena BullionDO;  [facility];  Service: Endoscopy;  Laterality: N/A;   ESOPHAGOGASTRODUODENOSCOPY N/A 12/26/2018   Procedure: ESOPHAGOGASTRODUODENOSCOPY (EGD);  Surgeon: ReRogene HoustonMD;  [facility];  Service: Endoscopy;  Laterality: N/A;   ESOPHAGOGASTRODUODENOSCOPY (EGD) WITH PROPOFOL N/A 03/23/2019   Procedure: ESOPHAGOGASTRODUODENOSCOPY (EGD) WITH PROPOFOL;  Surgeon: ReRogene HoustonMD;  [facility];  Service: Endoscopy;  Laterality: N/A;  7:30   FRACTURE SURGERY     ankle and elbow   [facility] N/A 09/16/2021   Procedure: [facility]EDANCE STUDY;  Surgeon: CiLavena BullionDO;  [facility];  Service: Endoscopy;  Laterality: N/A;   PILONIDAL CYST EXCISION N/A 03/24/2017   Procedure: EXCISION CHRONIC  PILONIDAL ABSCESS;  Surgeon: BlCoralie KeensMD;  Location: [facility];  Service: General;  Laterality: N/A;   SCROTAL EXPLORATION N/A 11/13/2020   Procedure: EXCISION OF SEBACEOUS CYSTS, SCROTUM;  Surgeon: McCleon GustinMD;  Location:  [facility];  Service: Urology;  Laterality: N/A;   TENDON REPAIR Left ~ 2004   "main tendon in my ankle"    Allergies  Allergies  Allergen Reactions   Omeprazole Magnesium Swelling    Face swells, no breathing impairment   Bee Venom    Esomeprazole Swelling    Face swells, no breathing impairment   Morphine And Related     Itching at IV site, dry heaving, burning sensation in throat.    History of Present Illness    52 year old male with a history  of nonobstructive CAD, hypertension, hyperlipidemia, diabetes, family history of premature CAD, tobacco abuse, and orthostasis.  He previously underwent diagnostic catheterization October 2016 showing mild diffuse, nonobstructive CAD.  In August 2022, he was seen in the office with complaints of sharp shooting chest pain.  He was placed on metoprolol and a Lexiscan Myoview was performed showing a small area of mild intensity apical inferior defect with partial reversibility.  EF was 57%.  Study was overall felt to be low risk.  He was medically managed and placed on Imdur, which resulted in orthostasis and was subsequently discontinued.  Due to ongoing orthostasis at follow-up appointments, beta-blocker was also discontinued, and he was placed on midodrine.  Mr. Ebsen was last seen in cardiology clinic in November 2023 at which time he reported doing somewhat better on midodrine 5 mg 3 times daily.  He was recently evaluated in the emergency department on February 26 with hyperglycemia.  Glucose was 677 on presentation.  He was managed in the ED and was able to be discharged home.  He notes that over the past 6 to 12 months, he has had stable and chronic dyspnea on exertion as well as exertional chest heaviness.  This occurs at least once a week and seems to be worse when he is agitated or anxious.  Symptoms might also occur at rest.  In discussing this with him, he says the symptoms date back several years and have not changed at least since his last stress test in 2022.  He has continued to have orthostatic lightheadedness 2-3 times per day despite taking midodrine 5 mg 3 times daily.  He would be interested in trying a higher dose.  He continues to smoke cigarettes-between a half a pack and 1 pack a day.  He recognizes that he needs to quit and has sometimes tried but then experiences anxiety and goes back to smoking.  He denies palpitations, PND, orthopnea, syncope, edema, or early satiety.  Home  Medications    Current Outpatient Medications  Medication Sig Dispense Refill   acetaminophen (TYLENOL) 500 MG tablet Take 1,500 mg by mouth in the morning and at bedtime.     ALPRAZolam (XANAX) 0.5 MG tablet Take 0.25-0.5 mg by mouth 3 (three) times daily as needed for anxiety.     ARIPiprazole (ABILIFY) 10 MG tablet Take 10 mg by mouth daily.     aspirin EC 81 MG tablet Take 1 tablet by mouth daily with breakfast.     atorvastatin (LIPITOR) 80 MG tablet TAKE 1 Tablet BY MOUTH ONCE EVERY DAY (Patient taking differently: Take 80 mg by mouth daily.) 90 tablet 0   BD PEN NEEDLE MICRO U/F 32G X 6 MM MISC See admin instructions.     budesonide-formoterol (SYMBICORT) 80-4.5 MCG/ACT inhaler Take 2 puffs first thing in am and then another 2 puffs about 12 hours later. (Patient taking differently: Inhale 2 puffs into the lungs every 12 (twelve) hours.  Take 2 puffs first thing in am and then another 2 puffs about 12 hours later.) 1 each 12   Continuous Blood Gluc Receiver (DEXCOM G7 RECEIVER) DEVI Use to monitor BG continuously 1 each 0   Continuous Blood Gluc Sensor (DEXCOM G7 SENSOR) MISC Change sensor every 10 days 3 each 2   famotidine (PEPCID) 40 MG tablet Take 1 tablet (40 mg total) by mouth 2 (two) times daily. 60 tablet 3   gabapentin (NEURONTIN) 300 MG capsule Take 1 capsule (300 mg total) by mouth every 8 (eight) hours as needed. (Patient taking differently: Take 300 mg by mouth every 8 (eight) hours as needed (pain).) 30 capsule 0   insulin aspart protamine - aspart (NOVOLOG MIX 70/30 FLEXPEN) (70-30) 100 UNIT/ML FlexPen Inject 80 units with breakfast and 60 units with supper when pre-meal glucose is above 90 (Patient taking differently: Inject 60-80 Units into the skin 2 (two) times daily with a meal. Inject 80 units with breakfast and 60 units with supper when pre-meal glucose is above 90) 30 mL 2   insulin degludec (TRESIBA FLEXTOUCH) 200 UNIT/ML FlexTouch Pen Inject 15 Units into the skin at  bedtime.     linaclotide (LINZESS) 145 MCG CAPS capsule Take 1 capsule (145 mcg total) by mouth daily before breakfast. 90 capsule 3   metFORMIN (GLUCOPHAGE) 500 MG tablet TAKE 1 Tablet  BY MOUTH TWICE DAILY WITH A MEAL (Patient taking differently: Take 500 mg by mouth 2 (two) times daily with a meal.) 180 tablet 0   midodrine (PROAMATINE) 10 MG tablet Take 1 tablet (10 mg total) by mouth 3 (three) times daily. 270 tablet 3   nitroGLYCERIN (NITROSTAT) 0.4 MG SL tablet Place 1 tablet (0.4 mg total) under the tongue every 5 (five) minutes as needed for chest pain. 25 tablet 3   polyethylene glycol (MIRALAX / GLYCOLAX) 17 g packet Take 17 g by mouth daily. (Patient taking differently: Take 17 g by mouth daily as needed for moderate constipation.) 14 each 0   prochlorperazine (COMPAZINE) 10 MG tablet Take 1 tablet (10 mg total) by mouth every 6 (six) hours as needed for nausea or vomiting. 20 tablet 0   sertraline (ZOLOFT) 100 MG tablet Take 1 tablet by mouth daily.     traZODone (DESYREL) 50 MG tablet Take 50 mg by mouth at bedtime.     Vonoprazan Fumarate 20 MG TABS Take 20 mg by mouth daily. Take 20 mg by mouth daily. For 8 weeks, then take 10 mg daily. 60 tablet 0   No current facility-administered medications for this visit.     Review of Systems    Intermittent rest and exertional chest pain and dyspnea as outlined above.  Ongoing orthostatic lightheadedness.  He denies palpitations, PND, orthopnea, syncope, edema, or early satiety.  All other systems reviewed and are otherwise negative except as noted above.    Physical Exam    VS:  BP 132/84   Pulse (!) 101   Ht '5\' 8"'$  (1.727 m)   Wt 146 lb (66.2 kg)   SpO2 98%   BMI 22.20 kg/m  , BMI Body mass index is 22.2 kg/m.     GEN: Well nourished, well developed, in no acute distress. HEENT: normal. Neck: Supple, no JVD, carotid bruits, or masses. Cardiac: RRR, no murmurs, rubs, or gallops. No clubbing, cyanosis, edema.  Radials 2+/PT 2+  and equal bilaterally.  Respiratory:  Respirations regular and unlabored, clear to auscultation bilaterally. GI: Soft, nontender, nondistended, BS +  x 4. MS: no deformity or atrophy. Skin: warm and dry, no rash. Neuro:  Strength and sensation are intact. Psych: Normal affect.  Accessory Clinical Findings     Lab Results  Component Value Date   WBC 7.7 07/26/2022   HGB 14.2 07/26/2022   HCT 41.9 07/26/2022   MCV 92.3 07/26/2022   PLT 263 07/26/2022   Lab Results  Component Value Date   CREATININE 1.12 07/26/2022   BUN 16 07/26/2022   NA 131 (L) 07/26/2022   K 4.5 07/26/2022   CL 95 (L) 07/26/2022   CO2 26 07/26/2022   Lab Results  Component Value Date   ALT 11 07/26/2022   AST 13 (L) 07/26/2022   ALKPHOS 88 07/26/2022   BILITOT 0.9 07/26/2022   Lab Results  Component Value Date   CHOL 259 (H) 09/15/2021   HDL 41 09/15/2021   LDLCALC 181 (H) 09/15/2021   LDLDIRECT 181.3 (H) 11/03/2020   TRIG 187 (H) 09/15/2021   CHOLHDL 6.3 09/15/2021    Lab Results  Component Value Date   HGBA1C 13.7 (H) 12/13/2021    Assessment & Plan    1.  Precordial chest pain/nonobstructive CAD: Patient with a long history of intermittent rest and exertional angina, particularly worse with anxiety and emotional upset.  Nonobstructive disease on catheterization 2016 with low risk stress test in 2022.  Patient notes chronic, stable symptoms ever since then occurring once a week or more.  We discussed potential repeat ischemic testing though he firmly stated that symptoms have been stable and therefore we deferred.  He remains on aspirin, statin.  Beta-blocker and nitrate previously discontinued in the setting of orthostasis.  2.  Orthostatic hypotension: Patient with ongoing orthostasis on midodrine 5 mg 3 times daily.  We will increase this to 10 mg 3 times daily.  Encouraged adequate hydration and salt liberalization.  3.  Hyperlipidemia: Complicated by intermittent noncompliance with statin  therapy.  LDL was 181 in April 2023.  He says he is currently taking atorvastatin 80 mg daily.  Not currently fasting.  We can arrange for fasting lipids at his next office visit.  4.  Tobacco abuse: Continues to smoke 1/2-1 full pack a day.  Not currently contemplating quitting.  Complete cessation advised.  5.  Disposition: Follow-up in 3 months or sooner if necessary.  Plan for fasting lipids at follow-up.  Murray Hodgkins, NP 08/04/2022, 5:54 PM

## 2022-08-18 NOTE — Procedures (Signed)
     Patient Name: [place], [place] Date: 07/20/2022 Gender: Male D.O.B: 12-23-1970 Age (years): 51 Referring Provider: Nicki Reaper Long PA-C Height (inches): 30 Interpreting Physician: Chesley Mires MD, ABSM Weight (lbs): 144 RPSGT: Rosebud Poles BMI: 22 MRN: [id number] Neck Size: 15.50  CLINICAL INFORMATION Sleep Study Type: HST  Indication for sleep study: OSA  Epworth Sleepiness Score: 2 Most recent polysomnogram dated 05/20/2017 revealed an AHI of 17.7/h and RDI of 24.6/h.  SLEEP STUDY TECHNIQUE A multi-channel overnight portable sleep study was performed. The channels recorded were: nasal airflow, thoracic respiratory movement, and oxygen saturation with a pulse oximetry. Snoring was also monitored.  MEDICATIONS Patient self administered medications include: N/A.  SLEEP ARCHITECTURE Patient was studied for 477.9 minutes. The sleep efficiency was 99.6 % and the patient was supine for 0%. The arousal index was 0.0 per hour.  RESPIRATORY PARAMETERS The overall AHI was 4.3 per hour, with a central apnea index of 0 per hour.  The oxygen nadir was 74% during sleep.  CARDIAC DATA Mean heart rate during sleep was 81.4 bpm.  IMPRESSIONS - No significant obstructive sleep apnea occurred during this study (AHI = 4.3/h). - Severe oxygen desaturation was noted during this study (Min O2 = 74%).  He spent 2.8 minutes of test time with an SpO2 < 88%. - Patient snored 3.6% during the sleep.  DIAGNOSIS - Snoring.  RECOMMENDATIONS - He can be scheduled for an in-lab nocturnal polysomnogram f there is still significant concern for obstructive sleep apnea. - Avoid alcohol, sedatives and other CNS depressants that may worsen sleep apnea and disrupt normal sleep architecture. - Sleep hygiene should be reviewed to assess factors that may improve sleep quality. - Weight management and regular exercise should be initiated or continued.  [Electronically signed] 08/18/2022 08:24  AM  Chesley Mires MD, ABSM Diplomate, American Board of Sleep Medicine NPI: [id number]  [place] PH: [phone]   FX: [phone] [place]

## 2022-08-27 ENCOUNTER — Emergency Department (HOSPITAL_COMMUNITY): Payer: Medicaid Other

## 2022-08-27 ENCOUNTER — Emergency Department (HOSPITAL_COMMUNITY)
Admission: EM | Admit: 2022-08-27 | Discharge: 2022-08-27 | Disposition: A | Discharge status: Home / Self Care | Payer: Medicaid Other | Source: Home / Self Care | Attending: Emergency Medicine | Admitting: Emergency Medicine

## 2022-08-27 ENCOUNTER — Inpatient Hospital Stay (HOSPITAL_COMMUNITY)
Admission: EM | Admit: 2022-08-27 | Discharge: 2022-08-30 | DRG: 637 | Disposition: A | Discharge status: Home / Self Care | Payer: Medicaid Other | Attending: Internal Medicine | Admitting: Internal Medicine

## 2022-08-27 DIAGNOSIS — Z885 Allergy status to narcotic agent status: Secondary | ICD-10-CM | POA: Diagnosis not present

## 2022-08-27 DIAGNOSIS — Z888 Allergy status to other drugs, medicaments and biological substances status: Secondary | ICD-10-CM | POA: Diagnosis not present

## 2022-08-27 DIAGNOSIS — G9341 Metabolic encephalopathy: Secondary | ICD-10-CM | POA: Diagnosis present

## 2022-08-27 DIAGNOSIS — M7989 Other specified soft tissue disorders: Secondary | ICD-10-CM | POA: Diagnosis not present

## 2022-08-27 DIAGNOSIS — J440 Chronic obstructive pulmonary disease with acute lower respiratory infection: Secondary | ICD-10-CM | POA: Diagnosis present

## 2022-08-27 DIAGNOSIS — J189 Pneumonia, unspecified organism: Secondary | ICD-10-CM | POA: Diagnosis present

## 2022-08-27 DIAGNOSIS — R4020 Unspecified coma: Secondary | ICD-10-CM

## 2022-08-27 DIAGNOSIS — R52 Pain, unspecified: Secondary | ICD-10-CM | POA: Diagnosis not present

## 2022-08-27 DIAGNOSIS — K224 Dyskinesia of esophagus: Secondary | ICD-10-CM | POA: Diagnosis present

## 2022-08-27 DIAGNOSIS — J9811 Atelectasis: Secondary | ICD-10-CM | POA: Diagnosis present

## 2022-08-27 DIAGNOSIS — E876 Hypokalemia: Secondary | ICD-10-CM | POA: Diagnosis present

## 2022-08-27 DIAGNOSIS — K219 Gastro-esophageal reflux disease without esophagitis: Secondary | ICD-10-CM | POA: Diagnosis present

## 2022-08-27 DIAGNOSIS — G4730 Sleep apnea, unspecified: Secondary | ICD-10-CM | POA: Diagnosis present

## 2022-08-27 DIAGNOSIS — E1110 Type 2 diabetes mellitus with ketoacidosis without coma: Secondary | ICD-10-CM

## 2022-08-27 DIAGNOSIS — Z7984 Long term (current) use of oral hypoglycemic drugs: Secondary | ICD-10-CM

## 2022-08-27 DIAGNOSIS — F419 Anxiety disorder, unspecified: Secondary | ICD-10-CM | POA: Diagnosis present

## 2022-08-27 DIAGNOSIS — Z79899 Other long term (current) drug therapy: Secondary | ICD-10-CM

## 2022-08-27 DIAGNOSIS — E785 Hyperlipidemia, unspecified: Secondary | ICD-10-CM | POA: Diagnosis present

## 2022-08-27 DIAGNOSIS — E1142 Type 2 diabetes mellitus with diabetic polyneuropathy: Secondary | ICD-10-CM | POA: Diagnosis present

## 2022-08-27 DIAGNOSIS — Z8249 Family history of ischemic heart disease and other diseases of the circulatory system: Secondary | ICD-10-CM

## 2022-08-27 DIAGNOSIS — R55 Syncope and collapse: Secondary | ICD-10-CM | POA: Diagnosis present

## 2022-08-27 DIAGNOSIS — M109 Gout, unspecified: Secondary | ICD-10-CM | POA: Diagnosis present

## 2022-08-27 DIAGNOSIS — E162 Hypoglycemia, unspecified: Secondary | ICD-10-CM

## 2022-08-27 DIAGNOSIS — R4182 Altered mental status, unspecified: Secondary | ICD-10-CM | POA: Diagnosis not present

## 2022-08-27 DIAGNOSIS — Z794 Long term (current) use of insulin: Secondary | ICD-10-CM

## 2022-08-27 DIAGNOSIS — I10 Essential (primary) hypertension: Secondary | ICD-10-CM | POA: Diagnosis present

## 2022-08-27 DIAGNOSIS — I82612 Acute embolism and thrombosis of superficial veins of left upper extremity: Secondary | ICD-10-CM | POA: Diagnosis present

## 2022-08-27 DIAGNOSIS — E11649 Type 2 diabetes mellitus with hypoglycemia without coma: Principal | ICD-10-CM | POA: Diagnosis present

## 2022-08-27 DIAGNOSIS — F17210 Nicotine dependence, cigarettes, uncomplicated: Secondary | ICD-10-CM | POA: Diagnosis present

## 2022-08-27 DIAGNOSIS — I2510 Atherosclerotic heart disease of native coronary artery without angina pectoris: Secondary | ICD-10-CM | POA: Diagnosis present

## 2022-08-27 DIAGNOSIS — E119 Type 2 diabetes mellitus without complications: Secondary | ICD-10-CM | POA: Diagnosis not present

## 2022-08-27 DIAGNOSIS — Z808 Family history of malignant neoplasm of other organs or systems: Secondary | ICD-10-CM

## 2022-08-27 DIAGNOSIS — Z91030 Bee allergy status: Secondary | ICD-10-CM | POA: Diagnosis not present

## 2022-08-27 DIAGNOSIS — J9601 Acute respiratory failure with hypoxia: Secondary | ICD-10-CM | POA: Diagnosis present

## 2022-08-27 DIAGNOSIS — Z7951 Long term (current) use of inhaled steroids: Secondary | ICD-10-CM

## 2022-08-27 DIAGNOSIS — Z7982 Long term (current) use of aspirin: Secondary | ICD-10-CM

## 2022-08-27 DIAGNOSIS — Z83438 Family history of other disorder of lipoprotein metabolism and other lipidemia: Secondary | ICD-10-CM

## 2022-08-27 DIAGNOSIS — Z833 Family history of diabetes mellitus: Secondary | ICD-10-CM

## 2022-08-27 LAB — BLOOD GAS, ARTERIAL
Acid-base deficit: 3.4 mmol/L — ABNORMAL HIGH (ref 0.0–2.0)
Bicarbonate: 21.4 mmol/L (ref 20.0–28.0)
Drawn by: 10555
O2 Saturation: 99.8 %
Patient temperature: 37
pCO2 arterial: 37 mmHg (ref 32–48)
pH, Arterial: 7.37 (ref 7.35–7.45)
pO2, Arterial: 142 mmHg — ABNORMAL HIGH (ref 83–108)

## 2022-08-27 LAB — LACTIC ACID, PLASMA
Lactic Acid, Venous: 1.5 mmol/L (ref 0.5–1.9)
Lactic Acid, Venous: 1.5 mmol/L (ref 0.5–1.9)

## 2022-08-27 LAB — RAPID URINE DRUG SCREEN, HOSP PERFORMED
Amphetamines: NOT DETECTED
Barbiturates: NOT DETECTED
Benzodiazepines: POSITIVE — AB
Cocaine: NOT DETECTED
Opiates: NOT DETECTED
Tetrahydrocannabinol: NOT DETECTED

## 2022-08-27 LAB — URINALYSIS, ROUTINE W REFLEX MICROSCOPIC
Bacteria, UA: NONE SEEN
Bilirubin Urine: NEGATIVE
Glucose, UA: 150 mg/dL — AB
Ketones, ur: NEGATIVE mg/dL
Leukocytes,Ua: NEGATIVE
Nitrite: NEGATIVE
Protein, ur: 100 mg/dL — AB
Specific Gravity, Urine: 1.006 (ref 1.005–1.030)
pH: 5 (ref 5.0–8.0)

## 2022-08-27 LAB — COMPREHENSIVE METABOLIC PANEL
ALT: 12 U/L (ref 0–44)
AST: 17 U/L (ref 15–41)
Albumin: 3.2 g/dL — ABNORMAL LOW (ref 3.5–5.0)
Alkaline Phosphatase: 83 U/L (ref 38–126)
Anion gap: 7 (ref 5–15)
BUN: 12 mg/dL (ref 6–20)
CO2: 24 mmol/L (ref 22–32)
Calcium: 8.1 mg/dL — ABNORMAL LOW (ref 8.9–10.3)
Chloride: 103 mmol/L (ref 98–111)
Creatinine, Ser: 0.96 mg/dL (ref 0.61–1.24)
GFR, Estimated: >60 mL/min (ref >60)
Glucose, Bld: 97 mg/dL (ref 70–99)
Potassium: 3.9 mmol/L (ref 3.5–5.1)
Sodium: 134 mmol/L — ABNORMAL LOW (ref 135–145)
Total Bilirubin: 0.7 mg/dL (ref 0.3–1.2)
Total Protein: 6.3 g/dL — ABNORMAL LOW (ref 6.5–8.1)

## 2022-08-27 LAB — CBG MONITORING, ED
Glucose-Capillary: 123 mg/dL — ABNORMAL HIGH (ref 70–99)
Glucose-Capillary: 124 mg/dL — ABNORMAL HIGH (ref 70–99)
Glucose-Capillary: 93 mg/dL (ref 70–99)
Glucose-Capillary: 64 mg/dL — ABNORMAL LOW (ref 70–99)
Glucose-Capillary: 141 mg/dL — ABNORMAL HIGH (ref 70–99)

## 2022-08-27 LAB — CBC WITH DIFFERENTIAL/PLATELET
Abs Immature Granulocytes: 0.03 10*3/uL (ref 0.00–0.07)
Basophils Absolute: 0 10*3/uL (ref 0.0–0.1)
Basophils Relative: 0 %
Eosinophils Absolute: 0 10*3/uL (ref 0.0–0.5)
Eosinophils Relative: 0 %
HCT: 44.3 % (ref 39.0–52.0)
Hemoglobin: 14.9 g/dL (ref 13.0–17.0)
Immature Granulocytes: 0 %
Lymphocytes Relative: 11 %
Lymphs Abs: 1.5 10*3/uL (ref 0.7–4.0)
MCH: 31.5 pg (ref 26.0–34.0)
MCHC: 33.6 g/dL (ref 30.0–36.0)
MCV: 93.7 fL (ref 80.0–100.0)
Monocytes Absolute: 0.5 10*3/uL (ref 0.1–1.0)
Monocytes Relative: 3 %
Neutro Abs: 12.2 10*3/uL — ABNORMAL HIGH (ref 1.7–7.7)
Neutrophils Relative %: 86 %
Platelets: 270 10*3/uL (ref 150–400)
RBC: 4.73 MIL/uL (ref 4.22–5.81)
RDW: 12.4 % (ref 11.5–15.5)
WBC: 14.3 10*3/uL — ABNORMAL HIGH (ref 4.0–10.5)
nRBC: 0 % (ref 0.0–0.2)

## 2022-08-27 LAB — TROPONIN I (HIGH SENSITIVITY)
Troponin I (High Sensitivity): 19 ng/L — ABNORMAL HIGH (ref <18)
Troponin I (High Sensitivity): 19 ng/L — ABNORMAL HIGH (ref <18)

## 2022-08-27 LAB — CK: Total CK: 60 U/L (ref 49–397)

## 2022-08-27 LAB — ETHANOL: Alcohol, Ethyl (B): <10 mg/dL (ref <10)

## 2022-08-27 MED ORDER — IOHEXOL 300 MG/ML  SOLN
75.0000 mL | Freq: Once | INTRAMUSCULAR | Status: AC | PRN
  Administered 2022-08-27: 75 mL via INTRAVENOUS

## 2022-08-27 MED ORDER — LORAZEPAM 2 MG/ML IJ SOLN
2.0000 mg | Freq: Once | INTRAMUSCULAR | Status: AC
  Administered 2022-08-27: 2 mg via INTRAVENOUS

## 2022-08-27 MED ORDER — PROPOFOL 1000 MG/100ML IV EMUL
INTRAVENOUS | Status: AC
  Filled 2022-08-27: qty 100

## 2022-08-27 MED ORDER — ETOMIDATE 2 MG/ML IV SOLN
INTRAVENOUS | Status: AC
  Filled 2022-08-27: qty 10

## 2022-08-27 MED ORDER — LORAZEPAM 2 MG/ML IJ SOLN
INTRAMUSCULAR | Status: AC
  Filled 2022-08-27: qty 1

## 2022-08-27 MED ORDER — SODIUM CHLORIDE 0.9 % IV BOLUS
1000.0000 mL | Freq: Once | INTRAVENOUS | Status: AC
  Administered 2022-08-27: 1000 mL via INTRAVENOUS

## 2022-08-27 MED ORDER — PROPOFOL 1000 MG/100ML IV EMUL
5.0000 ug/kg/min | INTRAVENOUS | Status: DC
  Administered 2022-08-27: 5 ug/kg/min via INTRAVENOUS

## 2022-08-27 MED ORDER — VANCOMYCIN HCL 1500 MG/300ML IV SOLN: 1500.0000 mg | Freq: Once | INTRAVENOUS | Status: DC

## 2022-08-27 MED ORDER — VANCOMYCIN HCL 1750 MG/350ML IV SOLN
1750.0000 mg | INTRAVENOUS | Status: DC
  Administered 2022-08-27: 1750 mg via INTRAVENOUS
  Filled 2022-08-27: qty 350

## 2022-08-27 MED ORDER — SODIUM CHLORIDE 0.9 % IV SOLN
500.0000 mg | Freq: Once | INTRAVENOUS | Status: AC
  Administered 2022-08-27: 500 mg via INTRAVENOUS
  Filled 2022-08-27: qty 5

## 2022-08-27 MED ORDER — ETOMIDATE 2 MG/ML IV SOLN
INTRAVENOUS | Status: AC | PRN
  Administered 2022-08-27: 20 mg via INTRAVENOUS

## 2022-08-27 MED ORDER — SUCCINYLCHOLINE CHLORIDE 200 MG/10ML IV SOSY
PREFILLED_SYRINGE | INTRAVENOUS | Status: AC
  Filled 2022-08-27: qty 10

## 2022-08-27 MED ORDER — DEXTROSE 50 % IV SOLN
1.0000 | Freq: Once | INTRAVENOUS | Status: AC
  Administered 2022-08-27: 50 mL via INTRAVENOUS
  Filled 2022-08-27: qty 50

## 2022-08-27 MED ORDER — SUCCINYLCHOLINE CHLORIDE 20 MG/ML IJ SOLN
INTRAMUSCULAR | Status: AC | PRN
  Administered 2022-08-27: 120 mg via INTRAVENOUS

## 2022-08-27 MED ORDER — SODIUM CHLORIDE 0.9 % IV SOLN
1.0000 g | Freq: Once | INTRAVENOUS | Status: AC
  Administered 2022-08-27: 1 g via INTRAVENOUS
  Filled 2022-08-27: qty 10

## 2022-08-27 MED ORDER — SODIUM CHLORIDE 0.9 % IV BOLUS: 1000.0000 mL | Freq: Once | INTRAVENOUS | Status: DC

## 2022-08-27 MED ORDER — DEXTROSE-NACL 5-0.9 % IV SOLN: INTRAVENOUS | Status: DC

## 2022-08-27 MED ORDER — PROPOFOL 1000 MG/100ML IV EMUL
5.0000 ug/kg/min | INTRAVENOUS | Status: DC
  Administered 2022-08-27: 5 ug/kg/min via INTRAVENOUS
  Administered 2022-08-28: 45 ug/kg/min via INTRAVENOUS
  Filled 2022-08-27 (×2): qty 100

## 2022-08-27 NOTE — ED Triage Notes (Signed)
Pt arrived via [facility] c/o hypogycemia , states fire dept checked cbg and it was 26. EMS gave 25g glucose d50 and cbg went up to 200s. Per EMS on arrival they checked cbg again and it was 141, This RN check cbg on arrival at bedsdie and it was 123. Pt is unresponsivE TO VOICE to voice

## 2022-08-27 NOTE — ED Notes (Addendum)
Patient upon intubation was hard to oxygenate, Placed on 100 and 650 cc Vt , rate of 30, after about 5 minutes saturation slowly increased to 100 Percent. Rt side lung fields extremely decreased , ETSX moderate amount clear secretions. Did not appear to have any food particles. After saturation became 100 slow weaned patient to 80 percent. Saturation stayed at 100 percent. X-ray showed good tube placement partial atelectasis on rt side. Patient decreased from 80 to 40 , vt drop to 550, Rate decreased to 24.  Blood gas drawn 30 minutes after on 40 percent setting.

## 2022-08-27 NOTE — ED Triage Notes (Signed)
Pt arrived via [facility] with c/o hypoglycemia, Per EMS, fire dept checked initial cbg and it was 26. EMS gave 25g glucose D50 and cbg was 250. On arrival to ED EMS stated they check cbg again and cbg was 141. This RN check cbg at bedside and it was 123. Hx of diabetes, Per EMS, they were told his cbg normally runs high, pt is still not responsive to voice

## 2022-08-27 NOTE — ED Notes (Signed)
Transported to ct and back 

## 2022-08-27 NOTE — ED Notes (Signed)
[name] [name] brother [phone] will be point of contact.

## 2022-08-27 NOTE — ED Notes (Signed)
LAST CBG 93. Verbal order to give narcan 4 mg, pt is still unresponsive at this time. Respiratory at bedside and MD at bedsdie

## 2022-08-27 NOTE — Progress Notes (Signed)
Pharmacy Antibiotic Note  [name] [name] is a 52 y.o. male admitted on 08/27/2022 with pneumonia.  Pharmacy has been consulted for vancomycin dosing.  Plan: Vancomycin 1750 mg IV every 24 hours (estAUC 502, SCr 0.96, using total body weight, and Vd 0.72) Monitor clinical progress, cultures/sensitivities, renal function, abx plan Vancomycin levels as indicated   Height: 5\' 8"  (172.7 cm) Weight: 66 kg (145 lb 8.1 oz) IBW/kg (Calculated) : 68.4  No data recorded.  Recent Labs  Lab 08/27/22 1854  WBC 14.3*  CREATININE 0.96  LATICACIDVEN 1.5    Estimated Creatinine Clearance: 83.1 mL/min (by C-G formula based on SCr of 0.96 mg/dL).    Allergies  Allergen Reactions   Omeprazole Magnesium Swelling    Face swells, no breathing impairment   Bee Venom    Esomeprazole Swelling    Face swells, no breathing impairment   Morphine And Related     Itching at IV site, dry heaving, burning sensation in throat.    Antimicrobials this admission: 3/29 vanc >>  3/29 ceftriaxone x 1 3/29 azithromycin x 1   Dose adjustments this admission:   Microbiology results: 3/29 BCx: sent    Thank you for allowing [place] to participate in this patients care. Jens Som, PharmD 08/27/2022 8:38 PM  **Pharmacist phone directory can be found on [email or web address] listed under [place]**

## 2022-08-27 NOTE — ED Notes (Signed)
OG remover per MD

## 2022-08-27 NOTE — ED Notes (Signed)
Pt to CT

## 2022-08-27 NOTE — ED Provider Notes (Signed)
[facility] Provider Note   CSN: [id number] Arrival date & time: 08/27/22  1842     History {Add pertinent medical, surgical, social history, OB history to HPI:1} Chief Complaint  Patient presents with   Hypoglycemia    [name] [name] is a 52 y.o. male.  Patient was found unresponsive by first responders his glucose was 26.  Patient has a history of COPD coronary artery disease and diabetes.  They gave him some glucose and when he arrived in the emergency department he was apneic and his sugar was normal.  Patient was given Narcan twice without help   Hypoglycemia      Home Medications Prior to Admission medications   Medication Sig Start Date End Date Taking? Authorizing Provider  acetaminophen (TYLENOL) 500 MG tablet Take 1,500 mg by mouth in the morning and at bedtime.    [provider]  ALPRAZolam Duanne Moron) 0.5 MG tablet Take 0.25-0.5 mg by mouth 3 (three) times daily as needed for anxiety.    [provider]  ARIPiprazole (ABILIFY) 10 MG tablet Take 10 mg by mouth daily.    [provider]  aspirin EC 81 MG tablet Take 1 tablet by mouth daily with breakfast. 02/14/21   [provider]  atorvastatin (LIPITOR) 80 MG tablet TAKE 1 Tablet BY MOUTH ONCE EVERY DAY Patient taking differently: Take 80 mg by mouth daily. 04/07/22   Soyla Dryer, PA-C  BD PEN NEEDLE MICRO U/F 32G X 6 MM MISC See admin instructions. 05/05/22   [provider]  budesonide-formoterol (SYMBICORT) 80-4.5 MCG/ACT inhaler Take 2 puffs first thing in am and then another 2 puffs about 12 hours later. Patient taking differently: Inhale 2 puffs into the lungs every 12 (twelve) hours. Take 2 puffs first thing in am and then another 2 puffs about 12 hours later. 06/11/22   Tanda Rockers, MD  Continuous Blood Gluc Receiver (DEXCOM G7 RECEIVER) DEVI Use to monitor BG continuously 02/02/22   Cassandria Anger, MD   Continuous Blood Gluc Sensor (DEXCOM G7 SENSOR) MISC Change sensor every 10 days 02/02/22   Cassandria Anger, MD  famotidine (PEPCID) 40 MG tablet Take 1 tablet (40 mg total) by mouth 2 (two) times daily. 05/26/21   Soyla Dryer, PA-C  gabapentin (NEURONTIN) 300 MG capsule Take 1 capsule (300 mg total) by mouth every 8 (eight) hours as needed. Patient taking differently: Take 300 mg by mouth every 8 (eight) hours as needed (pain). 10/22/21 01/22/23  Wyvonnia Dusky, MD  insulin aspart protamine - aspart (NOVOLOG MIX 70/30 FLEXPEN) (70-30) 100 UNIT/ML FlexPen Inject 80 units with breakfast and 60 units with supper when pre-meal glucose is above 90 Patient taking differently: Inject 60-80 Units into the skin 2 (two) times daily with a meal. Inject 80 units with breakfast and 60 units with supper when pre-meal glucose is above 90 02/23/22   Nida, Marella Chimes, MD  insulin degludec (TRESIBA FLEXTOUCH) 200 UNIT/ML FlexTouch Pen Inject 15 Units into the skin at bedtime.    [provider]  linaclotide Rolan Lipa) 145 MCG CAPS capsule Take 1 capsule (145 mcg total) by mouth daily before breakfast. 06/21/22 06/16/23  Cirigliano, Vito V, DO  metFORMIN (GLUCOPHAGE) 500 MG tablet TAKE 1 Tablet  BY MOUTH TWICE DAILY WITH A MEAL Patient taking differently: Take 500 mg by mouth 2 (two) times daily with a meal. 04/07/22   Nida, Marella Chimes, MD  midodrine (PROAMATINE) 10 MG tablet Take  1 tablet (10 mg total) by mouth 3 (three) times daily. 08/04/22   Theora Gianotti, NP  nitroGLYCERIN (NITROSTAT) 0.4 MG SL tablet Place 1 tablet (0.4 mg total) under the tongue every 5 (five) minutes as needed for chest pain. 10/05/17   Soyla Dryer, PA-C  polyethylene glycol (MIRALAX / GLYCOLAX) 17 g packet Take 17 g by mouth daily. Patient taking differently: Take 17 g by mouth daily as needed for moderate constipation. 11/11/20   Orpah Greek, MD  prochlorperazine (COMPAZINE) 10 MG tablet Take 1  tablet (10 mg total) by mouth every 6 (six) hours as needed for nausea or vomiting. [id number]   Delora Fuel, MD  sertraline (ZOLOFT) 100 MG tablet Take 1 tablet by mouth daily. 01/09/20   [provider]  traZODone (DESYREL) 50 MG tablet Take 50 mg by mouth at bedtime. 10/03/20   [provider]  Vonoprazan Fumarate 20 MG TABS Take 20 mg by mouth daily. Take 20 mg by mouth daily. For 8 weeks, then take 10 mg daily. 06/21/22   Cirigliano, Vito V, DO      Allergies    Omeprazole magnesium, Bee venom, Esomeprazole, and Morphine and related    Review of Systems   Review of Systems  Physical Exam Updated Vital Signs BP (!) 212/101   Pulse 82   Temp (!) 95.1 F (35.1 C) (Axillary)   Resp 18   Ht 5\' 8"  (1.727 m)   Wt 66 kg   SpO2 100%   BMI 22.12 kg/m  Physical Exam  ED Results / Procedures / Treatments   Labs (all labs ordered are listed, but only abnormal results are displayed) Labs Reviewed  CBC WITH DIFFERENTIAL/PLATELET - Abnormal; Notable for the following components:      Result Value   WBC 14.3 (*)    Neutro Abs 12.2 (*)    All other components within normal limits  COMPREHENSIVE METABOLIC PANEL - Abnormal; Notable for the following components:   Sodium 134 (*)    Calcium 8.1 (*)    Total Protein 6.3 (*)    Albumin 3.2 (*)    All other components within normal limits  URINALYSIS, ROUTINE W REFLEX MICROSCOPIC - Abnormal; Notable for the following components:   Color, Urine STRAW (*)    Glucose, UA 150 (*)    Hgb urine dipstick MODERATE (*)    Protein, ur 100 (*)    All other components within normal limits  RAPID URINE DRUG SCREEN, HOSP PERFORMED - Abnormal; Notable for the following components:   Benzodiazepines POSITIVE (*)    All other components within normal limits  BLOOD GAS, ARTERIAL - Abnormal; Notable for the following components:   pO2, Arterial 142 (*)    Acid-base deficit 3.4 (*)    All other components within normal limits  CBG  MONITORING, ED - Abnormal; Notable for the following components:   Glucose-Capillary 124 (*)    All other components within normal limits  CBG MONITORING, ED - Abnormal; Notable for the following components:   Glucose-Capillary 64 (*)    All other components within normal limits  CBG MONITORING, ED - Abnormal; Notable for the following components:   Glucose-Capillary 141 (*)    All other components within normal limits  CULTURE, BLOOD (ROUTINE X 2)  CULTURE, BLOOD (ROUTINE X 2)  ETHANOL  CK  LACTIC ACID, PLASMA  LACTIC ACID, PLASMA  CBG MONITORING, ED  TROPONIN I (HIGH SENSITIVITY)    EKG None  Radiology CT Chest  W Contrast  Result Date: 08/27/2022 CLINICAL DATA:  Abnormal x-ray EXAM: CT CHEST WITH CONTRAST TECHNIQUE: Multidetector CT imaging of the chest was performed during intravenous contrast administration. RADIATION DOSE REDUCTION: This exam was performed according to the departmental dose-optimization program which includes automated exposure control, adjustment of the mA and/or kV according to patient size and/or use of iterative reconstruction technique. CONTRAST:  81mL OMNIPAQUE IOHEXOL 300 MG/ML  SOLN COMPARISON:  Chest x-ray 08/27/2022 FINDINGS: Cardiovascular: Nonaneurysmal aorta. Mild coronary vascular calcification. Normal cardiac size. Trace pericardial effusion Mediastinum/Nodes: Endotracheal tube tip about 2.6 cm superior to the carina. Esophagus unremarkable. No suspicious lymph nodes. Negative thyroid Lungs/Pleura: Minimal atelectasis or scarring in the right apical lung. Mild bilateral bronchial wall thickening. No consolidation, pleural effusion or pneumothorax. Upper Abdomen: No acute abnormality. Musculoskeletal: No chest wall abnormality. No acute or significant osseous findings. IMPRESSION: 1. No CT evidence for acute intrathoracic abnormality. 2. Mild bilateral bronchial wall thickening. Minimal right upper lobe atelectasis. Electronically Signed   By: Donavan Foil M.D.   On: 08/27/2022 21:09   CT Head Wo Contrast  Result Date: 08/27/2022 CLINICAL DATA:  Memory loss EXAM: CT HEAD WITHOUT CONTRAST TECHNIQUE: Contiguous axial images were obtained from the base of the skull through the vertex without intravenous contrast. RADIATION DOSE REDUCTION: This exam was performed according to the departmental dose-optimization program which includes automated exposure control, adjustment of the mA and/or kV according to patient size and/or use of iterative reconstruction technique. COMPARISON:  CT 01/18/2022 FINDINGS: Brain: No acute territorial infarction, hemorrhage or intracranial mass. Stable ventricle size Vascular: No hyperdense vessels.  Carotid vascular calcification. Skull: Normal. Negative for fracture or focal lesion. Sinuses/Orbits: Mucosal thickening in the sinuses. Other: None IMPRESSION: Negative non contrasted CT appearance of the brain. Electronically Signed   By: Donavan Foil M.D.   On: 08/27/2022 21:03   DG Chest Port 1 View  Result Date: 08/27/2022 CLINICAL DATA:  Endotracheal tube placement EXAM: PORTABLE CHEST 1 VIEW COMPARISON:  Previous studies including the examination done earlier today FINDINGS: There is a decrease in volume in right lung. Right hemidiaphragm is elevated. There is possible air trapping in the left lung. Lateral aspect of right lung is not included limiting evaluation. There are possible patchy infiltrates in right lung. There is shift of mediastinum to the right. Tip of endotracheal tube is 4.3 cm above the carina. NG tube is noted traversing the esophagus. IMPRESSION: Tip of endotracheal tube is 4.3 cm above the carina. NG tube is noted traversing the esophagus. There is interval decrease in volume in right lung and possible air trapping in the left lung. Possibility of evolving partial atelectasis in right lung should be considered. Right lung is not included in its entirety limiting evaluation. Repeat AP and lateral views  including the entire right lung along with CT if warranted should be considered for further evaluation. Electronically Signed   By: Elmer Picker M.D.   On: 08/27/2022 19:51   DG Chest Port 1 View  Result Date: 08/27/2022 CLINICAL DATA:  Shortness of breath. EXAM: PORTABLE CHEST 1 VIEW COMPARISON:  Chest radiograph dated 01/18/2022. FINDINGS: Ill-defined opacity in the right apex may represent pneumonia. Repeat radiograph with better inspiration and PA and lateral views may provide better evaluation. There are bibasilar atelectasis. No pleural effusion or pneumothorax. The cardiac silhouette is within normal limits. No acute osseous pathology. IMPRESSION: Ill-defined opacity in the right apex may represent pneumonia. Further evaluation with two-view radiograph or CT or follow-up to  resolution recommended. Electronically Signed   By: Anner Crete M.D.   On: 08/27/2022 19:13    Procedures Procedures  {Document cardiac monitor, telemetry assessment procedure when appropriate:1}  Medications Ordered in ED Medications  propofol (DIPRIVAN) 1000 MG/100ML infusion (65 mcg/kg/min  66 kg (Adjusted) Intravenous Rate/Dose Change 08/27/22 2045)  azithromycin (ZITHROMAX) 500 mg in sodium chloride 0.9 % 250 mL IVPB (500 mg Intravenous New Bag/Given 08/27/22 2105)  LORazepam (ATIVAN) 2 MG/ML injection (has no administration in time range)  dextrose 5 %-0.9 % sodium chloride infusion ( Intravenous New Bag/Given 08/27/22 2112)  vancomycin (VANCOREADY) IVPB 1750 mg/350 mL (has no administration in time range)  sodium chloride 0.9 % bolus 1,000 mL (1,000 mLs Intravenous Bolus 08/27/22 1914)  etomidate (AMIDATE) injection ( Intravenous Canceled Entry 08/27/22 1930)  succinylcholine (ANECTINE) injection ( Intravenous Canceled Entry 08/27/22 1930)  cefTRIAXone (ROCEPHIN) 1 g in sodium chloride 0.9 % 100 mL IVPB (0 g Intravenous Stopped 08/27/22 2108)  LORazepam (ATIVAN) injection 2 mg (2 mg Intravenous Given  08/27/22 2006)  sodium chloride 0.9 % bolus 1,000 mL (1,000 mLs Intravenous New Bag/Given 08/27/22 2024)  dextrose 50 % solution 50 mL (50 mLs Intravenous Given 08/27/22 2016)  iohexol (OMNIPAQUE) 300 MG/ML solution 75 mL (75 mLs Intravenous Contrast Given 08/27/22 2056)    ED Course/ Medical Decision Making/ A&P  CRITICAL CARE Performed by: [name] Total critical care time: 40 minutes Critical care time was exclusive of separately billable procedures and treating other patients. Critical care was necessary to treat or prevent imminent or life-threatening deterioration. Critical care was time spent personally by me on the following activities: development of treatment plan with patient and/or surrogate as well as nursing, discussions with consultants, evaluation of patient's response to treatment, examination of patient, obtaining history from patient or surrogate, ordering and performing treatments and interventions, ordering and review of laboratory studies, ordering and review of radiographic studies, pulse oximetry and re-evaluation of patient's condition.  {   Click here for ABCD2, HEART and other calculatorsREFRESH Note before signing :1}                          Medical Decision Making Amount and/or Complexity of Data Reviewed Labs: ordered. Radiology: ordered. ECG/medicine tests: ordered.  Risk Prescription drug management.   Patient with hypoglycemia and respiratory failure.  He has been intubated and was excepted by Dr. Marchelle Gearing to go to the [facility], ICU  {Document critical care time when appropriate:1} {Document review of labs and clinical decision tools ie heart score, Chads2Vasc2 etc:1}  {Document your independent review of radiology images, and any outside records:1} {Document your discussion with family members, caretakers, and with consultants:1} {Document social determinants of health affecting pt's care:1} {Document your decision making why or why not  admission, treatments were needed:1} Final Clinical Impression(s) / ED Diagnoses Final diagnoses:  None    Rx / DC Orders ED Discharge Orders     None

## 2022-08-27 NOTE — H&P (Shared)
NAME:  [name] [name], MRN:  [id number], DOB:  05/17/1971, LOS: 0 ADMISSION DATE:  08/27/2022, CONSULTATION DATE:  08/27/22 REFERRING MD:  Roderic Palau, CHIEF COMPLAINT:  unresponsive    History of Present Illness:  [name] [name] is a 52 yo man with hx of COPD, CAD, DM, HL,  , found down and EMS called and taken to [facility] ED.  Initially blood sugar was 26, improved with D50 up to 200s.  Unresponsive on arrival to ED, no improvement with narcan. He was intubated on arrival to the ED and initially hypoxic.  EKG without clear STEMI or clear ischemia, CXR without infiltrate, CTH unremarkable, labs showed WBC 14k, normal creatinine and lactic acid 1.5, glucose improved on D5, however on arrival to [facility] was down to 57.   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   Significant Hospital Events: Including procedures, antibiotic start and stop dates in addition to other pertinent events   3/30 found hypoglycemic and unresponsive, intubated in the ED  Interim History / Subjective:  Arrived to [facility] hemodynamically stable  Objective   Blood pressure (!) 212/101, pulse 82, temperature (!) 95.1 F (35.1 C), temperature source Axillary, resp. rate 18, height 5\' 8"  (1.727 m), weight 66 kg, SpO2 100 %.    Vent Mode: PRVC FiO2 (%):  [40 %-80 %] 40 % Set Rate:  [24 bmp] 24 bmp Vt Set:  [550 mL] 550 mL PEEP:  [5 cmH20] 5 cmH20 Plateau  Pressure:  [14 cmH20] 14 cmH20   Intake/Output Summary (Last 24 hours) at 08/27/2022 2211 Last data filed at 08/27/2022 2121 Gross per 24 hour  Intake 100 ml  Output 2200 ml  Net -2100 ml   Filed Weights   08/27/22 1942  Weight: 66 kg   General:  well-nourished M, critically ill intubated and sedated HEENT: MM pink/dry, sclera anicteric, PERRLA Neuro: examined on propofol, RASS -4 CV: s1s2 rrr, no m/r/g PULM:  mechanically ventilated, equal chest rise and synchronous with vent  GI: soft, non-distended  Extremities: warm/dry, no edema  Skin: no rashes or lesions   Resolved Hospital Problem list     Assessment & Plan:   Acute encephalopathy, syncopal episode most likely secondary to severe hypoglycemia Similar presentation 6 months ago, left AMA Unclear cause, home med list includes 80 units novolog qam and 60 units with dinner along with 15 units tresiba at bedtime and metformin 500mg  bid, possible this needs adjusted.  May also be developing sepsis, consider adrenal insufficiency -continue D10 with q2hr glucose checks -check A1c, TSH and cortisol  -check echo -CT head negative -follow blood cultures  Acute Hypoxic Respiratory Failure CT chest without contrast with RUL atelectasis, but no clear infiltrate O2 sats improved after intubation, now on 35% -consider PE in the setting of syncope, but O2 requirement fairly low and not tachycardic or hypotensive -propofol and fentanyl for PAD protocol --Maintain full vent support with SAT/SBT as tolerated -titrate Vent setting to maintain SpO2 greater than or equal to 90%. -HOB elevated  30 degrees. -Plateau pressures less than 30 cm H20.  -Follow chest x-ray, ABG prn.   -Bronchial hygiene and RT/bronchodilator protocol. -continue ceftriaxone and azithromycin   CAD, HL -continue Asa -check echo -trop flat at 19   Type 2 DM -hold home medications and insulin while hypoglycemic -D10 infusion -a1c      Best Practice  (right click and "Reselect all SmartList Selections" daily)   Diet/type: NPO DVT prophylaxis: LMWH GI prophylaxis: H2B Lines: N/A Foley:  Yes, and it is still needed Code Status:  full code Last date of multidisciplinary goals of care discussion [pending]  Labs   CBC: Recent Labs  Lab 08/27/22 1854  WBC 14.3*  NEUTROABS 12.2*  HGB 14.9  HCT 44.3  MCV 93.7  PLT [id number]    Basic Metabolic Panel: Recent Labs  Lab 08/27/22 1854  NA 134*  K 3.9  CL 103  CO2 24  GLUCOSE 97  BUN 12  CREATININE 0.96  CALCIUM 8.1*   GFR: Estimated Creatinine Clearance: 83.1 mL/min (by C-G formula based on SCr of 0.96 mg/dL). Recent Labs  Lab 08/27/22 1854 08/27/22 2133  WBC 14.3*  --   LATICACIDVEN 1.5 1.5    Liver Function Tests: Recent Labs  Lab 08/27/22 1854  AST 17  ALT 12  ALKPHOS 83  BILITOT 0.7  PROT 6.3*  ALBUMIN 3.2*   No results for input(s): "LIPASE", "AMYLASE" in the last 168 hours. No results for input(s): "AMMONIA" in the last 168 hours.  ABG    Component Value Date/Time   PHART 7.37 08/27/2022 2015   PCO2ART 37 08/27/2022 2015   PO2ART 142 (H) 08/27/2022 2015   HCO3 21.4 08/27/2022 2015   TCO2 28 04/22/2021 0122   ACIDBASEDEF 3.4 (H) 08/27/2022 2015   O2SAT 99.8 08/27/2022 2015     Coagulation Profile: No results for input(s): "INR", "PROTIME" in the last 168 hours.  Cardiac Enzymes: Recent Labs  Lab 08/27/22 1854  CKTOTAL 60    HbA1C: HbA1c, POC (controlled diabetic range)  Date/Time Value Ref Range Status  09/08/2021 09:36 AM 13.2 (A) 0.0 - 7.0 % Final  06/10/2021 02:06 PM 12.5 (A) 0.0 - 7.0 % Final   Hgb A1c MFr Bld  Date/Time Value Ref Range Status  12/13/2021 05:33 PM 13.7 (H) 4.8 - 5.6 % Final    Comment:    (NOTE) Pre diabetes:          5.7%-6.4%  Diabetes:              >6.4%  Glycemic control for   <7.0% adults with diabetes     CBG: Recent Labs  Lab 08/27/22 1904 08/27/22 1921 08/27/22 2005 08/27/22 2114  GLUCAP 93  124* 64* 141*    Review of Systems:   Unable to obtain  Past Medical History:  He,  has a past medical history of Anxiety, Arthritis, Asthma, CAD (coronary artery disease), Chronic bronchitis ([place]), Chronic upper back pain, COPD (chronic obstructive pulmonary disease) ([place]), Depression, Diabetic peripheral neuropathy ([place]), GERD (gastroesophageal reflux disease), History of gout, Hyperlipemia, Hypertension, Migraine, Noncompliance, 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   Surgical History:   Past Surgical History:  Procedure Laterality Date   ANKLE SURGERY Right 1982   "had extra bones in there; took them out"   [place]  12/26/2018   Procedure: BIOPSY;  Surgeon: Rogene Houston, MD;  [facility];  Service: Endoscopy;;  duodenal  biopsies   BIOPSY  03/23/2019   Procedure: BIOPSY;  Surgeon: Rogene Houston, MD;  [facility];  Service: Endoscopy;;  esophagus   CARDIAC CATHETERIZATION N/A 03/28/2015   Procedure: Left Heart Cath and Coronary Angiography;  Surgeon: Adrian Prows, MD;  Location: [place] CV LAB;  Service: Cardiovascular;  Laterality: N/A;   CARDIAC CATHETERIZATION N/A 03/28/2015   Procedure: Intravascular Pressure Wire/FFR Study;  Surgeon: Adrian Prows, MD;  Location: [place] CV LAB;  Service: Cardiovascular;  Laterality: N/A;   CARPAL TUNNEL RELEASE Left ~ 2008   COLONOSCOPY WITH ESOPHAGOGASTRODUODENOSCOPY (EGD)     COLONOSCOPY WITH PROPOFOL N/A 12/24/2019   Procedure: COLONOSCOPY WITH PROPOFOL;  Surgeon: Rogene Houston, MD;  [facility];  Service: Endoscopy;  Laterality: N/A;  955   ELBOW FRACTURE SURGERY Left ~ 2008   ESOPHAGEAL MANOMETRY N/A 09/16/2021   Procedure: ESOPHAGEAL MANOMETRY (EM);  Surgeon: Lavena Bullion, DO;  [facility];  Service: Endoscopy;  Laterality: N/A;   ESOPHAGOGASTRODUODENOSCOPY N/A 12/26/2018   Procedure:  ESOPHAGOGASTRODUODENOSCOPY (EGD);  Surgeon: Rogene Houston, MD;  [facility];  Service: Endoscopy;  Laterality: N/A;   ESOPHAGOGASTRODUODENOSCOPY (EGD) WITH PROPOFOL N/A 03/23/2019   Procedure: ESOPHAGOGASTRODUODENOSCOPY (EGD) WITH PROPOFOL;  Surgeon: Rogene Houston, MD;  [facility];  Service: Endoscopy;  Laterality: N/A;  7:30   FRACTURE SURGERY     ankle and elbow   [place] IMPEDANCE STUDY N/A 09/16/2021   Procedure: [place] IMPEDANCE STUDY;  Surgeon: Lavena Bullion, DO;  [facility];  Service: Endoscopy;  Laterality: N/A;   PILONIDAL CYST EXCISION N/A 03/24/2017   Procedure: EXCISION CHRONIC  PILONIDAL ABSCESS;  Surgeon: Coralie Keens, MD;  Location: [facility];  Service: General;  Laterality: N/A;   SCROTAL EXPLORATION N/A 11/13/2020   Procedure: EXCISION OF SEBACEOUS CYSTS, SCROTUM;  Surgeon: Cleon Gustin, MD;  Location: [facility];  Service: Urology;  Laterality: N/A;   TENDON REPAIR Left ~ 2004   "main tendon in my ankle"     Social History:   reports that he has been smoking cigarettes. He has a 34.00 pack-year smoking history. He has never been exposed to tobacco smoke. He has never used smokeless tobacco. He reports that he does not drink alcohol and does not use drugs.   Family History:  His family history includes Cancer in his mother; Congestive Heart Failure in his sister; Diabetes in his father and mother; Hyperlipidemia in his father; Hypertension in his father and mother; Throat cancer in his mother. There is no history of Colon cancer, Pancreatic cancer, or Liver disease.   Allergies Allergies  Allergen Reactions   Omeprazole Magnesium Swelling    Face swells, no breathing impairment   Bee Venom    Esomeprazole Swelling    Face swells, no breathing impairment   Morphine And Related     Itching at IV site, dry heaving, burning sensation in throat.     Home Medications  Prior to Admission medications   Medication Sig Start Date End  Date Taking? Authorizing Provider  acetaminophen (TYLENOL) 500 MG tablet Take 1,500 mg by mouth in the morning and at bedtime.    [provider]  ALPRAZolam Duanne Moron) 0.5 MG tablet Take 0.25-0.5 mg by mouth 3 (three) times daily as needed for anxiety.    [provider]  ARIPiprazole (ABILIFY) 10 MG tablet Take 10 mg by mouth daily.    [provider]  aspirin EC 81 MG tablet  Take 1 tablet by mouth daily with breakfast. 02/14/21   [provider]  atorvastatin (LIPITOR) 80 MG tablet TAKE 1 Tablet BY MOUTH ONCE EVERY DAY Patient taking differently: Take 80 mg by mouth daily. 04/07/22   Soyla Dryer, PA-C  BD PEN NEEDLE MICRO U/F 32G X 6 MM MISC See admin instructions. 05/05/22   [provider]  budesonide-formoterol (SYMBICORT) 80-4.5 MCG/ACT inhaler Take 2 puffs first thing in am and then another 2 puffs about 12 hours later. Patient taking differently: Inhale 2 puffs into the lungs every 12 (twelve) hours. Take 2 puffs first thing in am and then another 2 puffs about 12 hours later. 06/11/22   Tanda Rockers, MD  Continuous Blood Gluc Receiver (DEXCOM G7 RECEIVER) DEVI Use to monitor BG continuously 02/02/22   Cassandria Anger, MD  Continuous Blood Gluc Sensor (DEXCOM G7 SENSOR) MISC Change sensor every 10 days 02/02/22   Cassandria Anger, MD  famotidine (PEPCID) 40 MG tablet Take 1 tablet (40 mg total) by mouth 2 (two) times daily. 05/26/21   Soyla Dryer, PA-C  gabapentin (NEURONTIN) 300 MG capsule Take 1 capsule (300 mg total) by mouth every 8 (eight) hours as needed. Patient taking differently: Take 300 mg by mouth every 8 (eight) hours as needed (pain). 10/22/21 01/22/23  Wyvonnia Dusky, MD  insulin aspart protamine - aspart (NOVOLOG MIX 70/30 FLEXPEN) (70-30) 100 UNIT/ML FlexPen Inject 80 units with breakfast and 60 units with supper when pre-meal glucose is above 90 Patient taking differently: Inject 60-80 Units into the skin 2 (two)  times daily with a meal. Inject 80 units with breakfast and 60 units with supper when pre-meal glucose is above 90 02/23/22   Nida, Marella Chimes, MD  insulin degludec (TRESIBA FLEXTOUCH) 200 UNIT/ML FlexTouch Pen Inject 15 Units into the skin at bedtime.    [provider]  linaclotide Rolan Lipa) 145 MCG CAPS capsule Take 1 capsule (145 mcg total) by mouth daily before breakfast. 06/21/22 06/16/23  Cirigliano, Vito V, DO  metFORMIN (GLUCOPHAGE) 500 MG tablet TAKE 1 Tablet  BY MOUTH TWICE DAILY WITH A MEAL Patient taking differently: Take 500 mg by mouth 2 (two) times daily with a meal. 04/07/22   Nida, Marella Chimes, MD  midodrine (PROAMATINE) 10 MG tablet Take 1 tablet (10 mg total) by mouth 3 (three) times daily. 08/04/22   Theora Gianotti, NP  nitroGLYCERIN (NITROSTAT) 0.4 MG SL tablet Place 1 tablet (0.4 mg total) under the tongue every 5 (five) minutes as needed for chest pain. 10/05/17   Soyla Dryer, PA-C  polyethylene glycol (MIRALAX / GLYCOLAX) 17 g packet Take 17 g by mouth daily. Patient taking differently: Take 17 g by mouth daily as needed for moderate constipation. 11/11/20   Orpah Greek, MD  prochlorperazine (COMPAZINE) 10 MG tablet Take 1 tablet (10 mg total) by mouth every 6 (six) hours as needed for nausea or vomiting. [id number]   Delora Fuel, MD  sertraline (ZOLOFT) 100 MG tablet Take 1 tablet by mouth daily. 01/09/20   [provider]  traZODone (DESYREL) 50 MG tablet Take 50 mg by mouth at bedtime. 10/03/20   [provider]  Vonoprazan Fumarate 20 MG TABS Take 20 mg by mouth daily. Take 20 mg by mouth daily. For 8 weeks, then take 10 mg daily. 06/21/22   Cirigliano, Dominic Pea, DO     Critical care time:  40 minutes     CRITICAL CARE Performed by: Otilio Carpen Tylar Merendino  Total critical care time: 40 minutes  Critical care time was exclusive of separately billable procedures and treating other patients.  Critical care was necessary to  treat or prevent imminent or life-threatening deterioration.  Critical care was time spent personally by me on the following activities: development of treatment plan with patient and/or surrogate as well as nursing, discussions with consultants, evaluation of patient's response to treatment, examination of patient, obtaining history from patient or surrogate, ordering and performing treatments and interventions, ordering and review of laboratory studies, ordering and review of radiographic studies, pulse oximetry and re-evaluation of patient's condition.   Otilio Carpen Teyla Skidgel, PA-C [facility] See Amion for pager If no response to pager , please call [phone] until 7pm After 7:00 pm call Elink  [phone]?[place]

## 2022-08-27 NOTE — ED Notes (Signed)
Cbg at bedside upon arrival was 81, MD made aware and MD at bedside

## 2022-08-28 ENCOUNTER — Inpatient Hospital Stay (HOSPITAL_COMMUNITY): Payer: Medicaid Other

## 2022-08-28 DIAGNOSIS — R55 Syncope and collapse: Secondary | ICD-10-CM

## 2022-08-28 DIAGNOSIS — R4182 Altered mental status, unspecified: Secondary | ICD-10-CM | POA: Diagnosis not present

## 2022-08-28 DIAGNOSIS — E162 Hypoglycemia, unspecified: Secondary | ICD-10-CM | POA: Diagnosis not present

## 2022-08-28 LAB — CORTISOL: Cortisol, Plasma: 9.2 ug/dL

## 2022-08-28 LAB — GLUCOSE, CAPILLARY
Glucose-Capillary: 57 mg/dL — ABNORMAL LOW (ref 70–99)
Glucose-Capillary: 111 mg/dL — ABNORMAL HIGH (ref 70–99)
Glucose-Capillary: 90 mg/dL (ref 70–99)
Glucose-Capillary: 75 mg/dL (ref 70–99)
Glucose-Capillary: 66 mg/dL — ABNORMAL LOW (ref 70–99)
Glucose-Capillary: 151 mg/dL — ABNORMAL HIGH (ref 70–99)
Glucose-Capillary: 67 mg/dL — ABNORMAL LOW (ref 70–99)
Glucose-Capillary: 58 mg/dL — ABNORMAL LOW (ref 70–99)
Glucose-Capillary: 58 mg/dL — ABNORMAL LOW (ref 70–99)
Glucose-Capillary: 56 mg/dL — ABNORMAL LOW (ref 70–99)
Glucose-Capillary: 86 mg/dL (ref 70–99)
Glucose-Capillary: 144 mg/dL — ABNORMAL HIGH (ref 70–99)
Glucose-Capillary: 166 mg/dL — ABNORMAL HIGH (ref 70–99)
Glucose-Capillary: 136 mg/dL — ABNORMAL HIGH (ref 70–99)
Glucose-Capillary: 161 mg/dL — ABNORMAL HIGH (ref 70–99)
Glucose-Capillary: 237 mg/dL — ABNORMAL HIGH (ref 70–99)

## 2022-08-28 LAB — ECHOCARDIOGRAM COMPLETE
AR max vel: 2.92 cm2
AV Area VTI: 2.99 cm2
AV Area mean vel: 2.86 cm2
AV Mean grad: 3 mmHg
AV Peak grad: 5.4 mmHg
Ao pk vel: 1.16 m/s
Area-P 1/2: 3.93 cm2
Height: 68 in
S' Lateral: 2.8 cm
Weight: 2578.5 oz

## 2022-08-28 LAB — CBC
HCT: 37.7 % — ABNORMAL LOW (ref 39.0–52.0)
Hemoglobin: 12.7 g/dL — ABNORMAL LOW (ref 13.0–17.0)
MCH: 31.4 pg (ref 26.0–34.0)
MCHC: 33.7 g/dL (ref 30.0–36.0)
MCV: 93.3 fL (ref 80.0–100.0)
Platelets: 228 10*3/uL (ref 150–400)
RBC: 4.04 MIL/uL — ABNORMAL LOW (ref 4.22–5.81)
RDW: 12.7 % (ref 11.5–15.5)
WBC: 10.7 10*3/uL — ABNORMAL HIGH (ref 4.0–10.5)
nRBC: 0 % (ref 0.0–0.2)

## 2022-08-28 LAB — BASIC METABOLIC PANEL
Anion gap: 10 (ref 5–15)
BUN: 8 mg/dL (ref 6–20)
CO2: 19 mmol/L — ABNORMAL LOW (ref 22–32)
Calcium: 7.4 mg/dL — ABNORMAL LOW (ref 8.9–10.3)
Chloride: 110 mmol/L (ref 98–111)
Creatinine, Ser: 0.89 mg/dL (ref 0.61–1.24)
GFR, Estimated: >60 mL/min (ref >60)
Glucose, Bld: 74 mg/dL (ref 70–99)
Potassium: 3.4 mmol/L — ABNORMAL LOW (ref 3.5–5.1)
Sodium: 139 mmol/L (ref 135–145)

## 2022-08-28 LAB — TSH: TSH: 1.378 u[IU]/mL (ref 0.350–4.500)

## 2022-08-28 LAB — MAGNESIUM: Magnesium: 1.5 mg/dL — ABNORMAL LOW (ref 1.7–2.4)

## 2022-08-28 LAB — MRSA NEXT GEN BY PCR, NASAL: MRSA by PCR Next Gen: NOT DETECTED

## 2022-08-28 LAB — CBG MONITORING, ED: Glucose-Capillary: 92 mg/dL (ref 70–99)

## 2022-08-28 MED ORDER — DEXTROSE 10 % IV SOLN: INTRAVENOUS | Status: AC

## 2022-08-28 MED ORDER — SODIUM CHLORIDE 0.9 % IV BOLUS
1000.0000 mL | Freq: Once | INTRAVENOUS | Status: AC
  Administered 2022-08-28: 1000 mL via INTRAVENOUS

## 2022-08-28 MED ORDER — SODIUM CHLORIDE 0.9 % IV SOLN
1.0000 g | INTRAVENOUS | Status: DC
  Administered 2022-08-28 – 2022-08-30 (×2): 1 g via INTRAVENOUS
  Filled 2022-08-28 (×3): qty 10

## 2022-08-28 MED ORDER — POLYETHYLENE GLYCOL 3350 17 G PO PACK: 17.0000 g | PACK | Freq: Every day | ORAL | Status: DC

## 2022-08-28 MED ORDER — SODIUM CHLORIDE 0.9 % IV SOLN
500.0000 mg | INTRAVENOUS | Status: DC
  Administered 2022-08-28 – 2022-08-30 (×2): 500 mg via INTRAVENOUS
  Filled 2022-08-28 (×4): qty 5

## 2022-08-28 MED ORDER — FAMOTIDINE 20 MG PO TABS: 20.0000 mg | ORAL_TABLET | Freq: Two times a day (BID) | ORAL | Status: DC

## 2022-08-28 MED ORDER — ENOXAPARIN SODIUM 40 MG/0.4ML IJ SOSY
40.0000 mg | PREFILLED_SYRINGE | INTRAMUSCULAR | Status: DC
  Administered 2022-08-28 – 2022-08-30 (×3): 40 mg via SUBCUTANEOUS
  Filled 2022-08-28 (×3): qty 0.4

## 2022-08-28 MED ORDER — ALPRAZOLAM 0.5 MG PO TABS: 0.5000 mg | ORAL_TABLET | Freq: Three times a day (TID) | ORAL | Status: DC | PRN

## 2022-08-28 MED ORDER — POTASSIUM CHLORIDE 20 MEQ PO PACK
40.0000 meq | PACK | Freq: Once | ORAL | Status: AC
  Administered 2022-08-28: 40 meq
  Filled 2022-08-28: qty 2

## 2022-08-28 MED ORDER — ORAL CARE MOUTH RINSE: 15.0000 mL | OROMUCOSAL | Status: DC

## 2022-08-28 MED ORDER — FENTANYL CITRATE PF 50 MCG/ML IJ SOSY: 50.0000 ug | PREFILLED_SYRINGE | INTRAMUSCULAR | Status: DC | PRN

## 2022-08-28 MED ORDER — CHLORHEXIDINE GLUCONATE CLOTH 2 % EX PADS
6.0000 | MEDICATED_PAD | Freq: Every day | CUTANEOUS | Status: DC
  Administered 2022-08-28: 6 via TOPICAL

## 2022-08-28 MED ORDER — LACTATED RINGERS IV BOLUS
1000.0000 mL | Freq: Once | INTRAVENOUS | Status: AC
  Administered 2022-08-28: 1000 mL via INTRAVENOUS

## 2022-08-28 MED ORDER — POLYETHYLENE GLYCOL 3350 17 G PO PACK
17.0000 g | PACK | Freq: Every day | ORAL | Status: DC
  Administered 2022-08-28 – 2022-08-29 (×2): 17 g via ORAL
  Filled 2022-08-28 (×3): qty 1

## 2022-08-28 MED ORDER — DEXTROSE 50 % IV SOLN
INTRAVENOUS | Status: AC
  Filled 2022-08-28: qty 50

## 2022-08-28 MED ORDER — DEXTROSE 50 % IV SOLN
12.5000 g | INTRAVENOUS | Status: AC
  Administered 2022-08-28: 12.5 g via INTRAVENOUS

## 2022-08-28 MED ORDER — DOCUSATE SODIUM 50 MG/5ML PO LIQD: 100.0000 mg | Freq: Two times a day (BID) | ORAL | Status: DC

## 2022-08-28 MED ORDER — DEXMEDETOMIDINE HCL IN NACL 400 MCG/100ML IV SOLN
0.0000 ug/kg/h | INTRAVENOUS | Status: DC
  Administered 2022-08-28: 0.4 ug/kg/h via INTRAVENOUS

## 2022-08-28 MED ORDER — ORAL CARE MOUTH RINSE: 15.0000 mL | OROMUCOSAL | Status: DC | PRN

## 2022-08-28 MED ORDER — MAGNESIUM SULFATE 2 GM/50ML IV SOLN
2.0000 g | Freq: Once | INTRAVENOUS | Status: AC
  Administered 2022-08-28: 2 g via INTRAVENOUS
  Filled 2022-08-28: qty 50

## 2022-08-28 MED ORDER — SODIUM CHLORIDE 0.9 % IV BOLUS: 1000.0000 mL | Freq: Once | INTRAVENOUS | Status: AC

## 2022-08-28 MED ORDER — DEXMEDETOMIDINE HCL IN NACL 400 MCG/100ML IV SOLN
INTRAVENOUS | Status: AC
  Filled 2022-08-28: qty 100

## 2022-08-28 MED ORDER — DOCUSATE SODIUM 50 MG PO CAPS: 50.0000 mg | ORAL_CAPSULE | Freq: Two times a day (BID) | ORAL | Status: DC

## 2022-08-28 MED ORDER — FAMOTIDINE 20 MG PO TABS
20.0000 mg | ORAL_TABLET | Freq: Two times a day (BID) | ORAL | Status: DC
  Administered 2022-08-28 – 2022-08-30 (×5): 20 mg via ORAL
  Filled 2022-08-28 (×5): qty 1

## 2022-08-28 MED ORDER — DOCUSATE SODIUM 50 MG PO CAPS
100.0000 mg | ORAL_CAPSULE | Freq: Two times a day (BID) | ORAL | Status: DC
  Filled 2022-08-28: qty 2

## 2022-08-28 MED ORDER — FENTANYL 2500MCG IN NS 250ML (10MCG/ML) PREMIX INFUSION: 0.0000 ug/h | INTRAVENOUS | Status: DC

## 2022-08-28 MED ORDER — DOCUSATE SODIUM 50 MG PO CAPS
50.0000 mg | ORAL_CAPSULE | Freq: Two times a day (BID) | ORAL | Status: DC
  Administered 2022-08-29 – 2022-08-30 (×3): 50 mg via ORAL
  Filled 2022-08-28 (×6): qty 1

## 2022-08-28 NOTE — Progress Notes (Addendum)
Patient states and begins to cry, "I don't want to stay in this hospital. This is the hospital where my mom died." Brother states, "No it isn't. That was [place] an idiot."

## 2022-08-28 NOTE — Evaluation (Addendum)
Physical Therapy Evaluation/ Discharge Patient Details Name: [name] [name] MRN: [id number] DOB: December 29, 1970 Today's Date: 08/28/2022  History of Present Illness  52 yo male admitted 3/29 after found unresponsive with hypoglycemia with maintained obtundation after improvement in CBG with intubation 3/29-3/30. PMHx: COPD, DM, CAD, HLD  Clinical Impression  Pt pleasant, in chair on arrival and reports living with brother and dad with RW and cane at home. Pt states no falls other than syncope at home and that he cares for himself and drives. Pt moving without physical assist this session and maintaining >95% on RA throughout. Pt at baseline functional level without further need for acute therapy intervention, will sign off.        Recommendations for follow up therapy are one component of a multi-disciplinary discharge planning process, led by the attending physician.  Recommendations may be updated based on patient status, additional functional criteria and insurance authorization.  Follow Up Recommendations       Assistance Recommended at Discharge PRN  Patient can return home with the following  Assistance with cooking/housework;Direct supervision/assist for financial management    Equipment Recommendations None recommended by PT  Recommendations for Other Services       Functional Status Assessment Patient has not had a recent decline in their functional status     Precautions / Restrictions Precautions Precautions: Fall      Mobility  Bed Mobility Overal bed mobility: Modified Independent                  Transfers Overall transfer level: Modified independent                      Ambulation/Gait Ambulation/Gait assistance: Supervision Gait Distance (Feet): 300 Feet Assistive device: 1 person hand held assist Gait Pattern/deviations: Step-through pattern, Shuffle, Wide base of support   Gait velocity interpretation: 1.31 - 2.62 ft/sec, indicative of  limited community ambulator   General Gait Details: pt with decreased stride and reports decreased step length, shuffling at baseline, unable to increase step length. HHA for pt comfort but not needed  Stairs            Wheelchair Mobility    Modified Rankin (Stroke Patients Only)       Balance Overall balance assessment: Mild deficits observed, not formally tested                                           Pertinent Vitals/Pain Pain Assessment Pain Assessment: No/denies pain    Home Living Family/patient expects to be discharged to:: Private residence Living Arrangements: Parent;Other relatives Available Help at Discharge: Family;Available 24 hours/day Type of Home: House Home Access: Level entry       Home Layout: One level Home Equipment: Conservation officer, nature (2 wheels);Cane - single point;Shower seat;Grab bars - tub/shower      Prior Function Prior Level of Function : Independent/Modified Independent;Driving                     Hand Dominance        Extremity/Trunk Assessment   Upper Extremity Assessment Upper Extremity Assessment: Overall WFL for tasks assessed    Lower Extremity Assessment Lower Extremity Assessment: Overall WFL for tasks assessed    Cervical / Trunk Assessment Cervical / Trunk Assessment: Normal  Communication   Communication: No difficulties  Cognition Arousal/Alertness: Awake/alert Behavior During  Therapy: WFL for tasks assessed/performed Overall Cognitive Status: History of cognitive impairments - at baseline                                          General Comments      Exercises     Assessment/Plan    PT Assessment Patient does not need any further PT services  PT Problem List         PT Treatment Interventions      PT Goals (Current goals can be found in the Care Plan section)  Acute Rehab PT Goals PT Goal Formulation: All assessment and education complete, DC  therapy    Frequency       Co-evaluation               AM-PAC PT "6 Clicks" Mobility  Outcome Measure Help needed turning from your back to your side while in a flat bed without using bedrails?: None Help needed moving from lying on your back to sitting on the side of a flat bed without using bedrails?: None Help needed moving to and from a bed to a chair (including a wheelchair)?: None Help needed standing up from a chair using your arms (e.g., wheelchair or bedside chair)?: None Help needed to walk in hospital room?: None Help needed climbing 3-5 steps with a railing? : A Little 6 Click Score: 23    End of Session   Activity Tolerance: Patient tolerated treatment well Patient left: in chair;with call bell/phone within reach Nurse Communication: Mobility status PT Visit Diagnosis: Other abnormalities of gait and mobility (R26.89)    Time: 1334-1350 PT Time Calculation (min) (ACUTE ONLY): 16 min   Charges:   PT Evaluation $PT Eval Low Complexity: 1 Low          Wynn Alldredge P, PT Acute Rehabilitation Services Office: [place] B Livy Ross 08/28/2022, 1:59 PM

## 2022-08-28 NOTE — Progress Notes (Signed)
eLink Physician-Brief Progress Note Patient Name: [name] [name] DOB: 1971/02/03 MRN: [id number]   Date of Service  08/28/2022  HPI/Events of Note  Patient admitted with altered mental status in the context of an extremely low blood sugar, patient ultimately required intubation and mechanical ventilation.   eICU Interventions  New Patient Evaluation.        Leasia Swann U Jaxtin Raimondo 08/28/2022, 1:21 AM

## 2022-08-28 NOTE — Procedures (Signed)
Extubation Procedure Note  Patient Details:   Name: COAST FRANCIA DOB: [birth year] MRN: [id number]   Airway Documentation:    Vent end date: 08/28/22 Vent end time: 0854   Evaluation  O2 sats: stable throughout Complications: No apparent complications Patient did tolerate procedure well. Bilateral Breath Sounds: Diminished   Extubated per MD order & placed on 4L [place]. Able to speak & cough post extubation. In no distress at this time.  Kathie Dike 08/28/2022, 8:56 AM

## 2022-08-28 NOTE — Progress Notes (Signed)
Pt BG 86 after all interventions to increase BG (orange juice, saltine crackers, and peanut butter given). Pt A&O x 4. VS stable. Denies dizziness or N/V.

## 2022-08-28 NOTE — Progress Notes (Signed)
RT at bedside for pt arrival from [facility].

## 2022-08-28 NOTE — Procedures (Signed)
Patient Name: [name] [name]  MRN: [id number]  Epilepsy Attending: Lora Havens  Referring Physician/Provider: Collier Bullock, MD  Date: 08/28/2022 Duration: 22.17 mins  Patient history: 52yo M with ams. EEG to evaluate for seizure  Level of alertness:  comatose  AEDs during EEG study: Propofol  Technical aspects: This EEG study was done with scalp electrodes positioned according to the 10-20 International system of electrode placement. Electrical activity was reviewed with band pass filter of 1-70Hz , sensitivity of 7 uV/mm, display speed of 76mm/sec with a 60Hz  notched filter applied as appropriate. EEG data were recorded continuously and digitally stored.  Video monitoring was available and reviewed as appropriate.  Description: EEG showed continuous generalized 3 to 6 Hz theta-delta slowing admixed with an excessive amount of 15 to 18 Hz beta activity distributed symmetrically and diffusely. Hyperventilation and photic stimulation were not performed.     ABNORMALITY - Continuous slow, generalized  IMPRESSION: This study is suggestive of severe diffuse encephalopathy most likely related to sedation. No seizures or epileptiform discharges were seen throughout the recording.  [name] [name]

## 2022-08-28 NOTE — Progress Notes (Signed)
Brother Lanny Hurst at bedside. Brother explaining to the patient that he should not leave hospital.

## 2022-08-28 NOTE — Progress Notes (Signed)
Patient is attempting to leave AMA. Dr. Loanne Drilling notified. Pt has been explained the risks of leaving the hospital at this time. Pt denied leaving AMA during previous hospitalization in February for syncope and hypoglycemia, although documentation says otherwise.

## 2022-08-28 NOTE — Progress Notes (Signed)
Brother refusing to take patient home. Patient and brother in room arguing over patient's desire to leave AMA.

## 2022-08-28 NOTE — Progress Notes (Signed)
Pt BG 56. 8 oz of orange juice and peanut butter given to patient. Pt A&O x 4. VS stable. Denies dizziness or N/V.

## 2022-08-28 NOTE — Progress Notes (Signed)
Patient has agreed to stay in the hospital overnight.

## 2022-08-28 NOTE — Progress Notes (Signed)
Pt BG 67. 4 oz of orange juice given. Pt A&O x 4. VS stable. Denies dizziness or N/V.

## 2022-08-28 NOTE — ED Notes (Signed)
Family updated with pt status and transfer

## 2022-08-28 NOTE — Progress Notes (Signed)
NAME:  [name] [name], MRN:  [id number], DOB:  08-06-1970, LOS: 1 ADMISSION DATE:  08/27/2022, CONSULTATION DATE:  08/27/22 REFERRING MD:  Roderic Palau, CHIEF COMPLAINT:  unresponsive    History of Present Illness:  [name] is a 52 yo man with hx of COPD, CAD, DM, HL,  , found down and EMS called and taken to [facility] ED.  Initially blood sugar was 26, improved with D50 up to 200s.  Unresponsive on arrival to ED, no improvement with narcan. He was intubated on arrival to the ED and initially hypoxic.  EKG without clear STEMI or clear ischemia, CXR without infiltrate, CTH unremarkable, labs showed WBC 14k, normal creatinine and lactic acid 1.5, glucose improved on D5, however on arrival to [facility] was down to 57.   It appears pt had an admission for syncope 6 months ago and had significant hypoglycemia at that time, he left AMA before getting an echo or PT evaluation  Pertinent  Medical History   has a past medical history of Anxiety, Arthritis, Asthma, CAD (coronary artery disease), Chronic bronchitis ([place]), Chronic upper back pain, COPD (chronic obstructive pulmonary disease) ([place]), Depression, Diabetic peripheral neuropathy ([place]), GERD (gastroesophageal reflux disease), History of gout, Hyperlipemia, Hypertension, Migraine, Noncompliance, Ringing in the ears, bilateral, Sleep apnea (2016), and Type 2 diabetes mellitus ([place]).   Significant Hospital Events: Including procedures, antibiotic start and stop dates in addition to other pertinent events   3/30 found hypoglycemic and unresponsive, intubated in the ED  Interim History / Subjective:  Transferred from [facility] overnight on MV EEG neg D5 increased D10 for hypoglycemia  Objective   Blood pressure (!) 154/90, pulse 88, temperature 97.9 F (36.6 C), temperature source Bladder, resp. rate 17, height 5\' 8"  (1.727 m), weight 73.1 kg, SpO2 100 %.    Vent Mode: CPAP;PSV FiO2 (%):  [35 %-80 %] 35 % Set Rate:  [14 bmp-24 bmp] 14 bmp Vt Set:  [500  mL-550 mL] 500 mL PEEP:  [5 cmH20] 5 cmH20 Pressure Support:  [5 cmH20] 5 cmH20 Plateau Pressure:  [12 cmH20-14 cmH20] 12 cmH20   Intake/Output Summary (Last 24 hours) at 08/28/2022 0813 Last data filed at 08/28/2022 0745 Gross per 24 hour  Intake 4437.75 ml  Output 2450 ml  Net 1987.75 ml   Filed Weights   08/27/22 1942 08/28/22 0500  Weight: 66 kg 73.1 kg   Physical Exam: General: Well-appearing, no acute distress HENT: [place], AT, ETT in place Eyes: EOMI, no scleral icterus Respiratory: Clear to auscultation bilaterally.  No crackles, wheezing or rales Cardiovascular: RRR, -M/R/G, no JVD GI: BS+, soft, nontender Extremities:-Edema,-tenderness Neuro: Drowsy with intermittent agitation, follows commands, moves extremities x 4  K 3.4 CO2 19 WBC improved 10.7  CT Chest 3/29 RUL atelectasis CT head 3/29 negative   Resolved Hospital Problem list     Assessment & Plan:   Acute encephalopathy, syncopal episode most likely secondary to severe hypoglycemia - resolving Similar presentation 6 months ago, left AMA CT head nag. Unclear cause, home med list includes 80 units novolog qam and 60 units with dinner along with 15 units tresiba at bedtime and metformin 500mg  bid, possible this needs adjusted.  May also be developing sepsis, consider adrenal insufficiency. TSH, cortisol normal -continue D10 with q2hr glucose checks -check echo  Acute Hypoxic Respiratory Failure CT chest without contrast with RUL atelectasis, but no clear infiltrate O2 sats improved after intubation, now on 35% -consider PE in the setting of syncope, but O2 requirement fairly low  and not tachycardic or hypotensive -Weaned off propofol and fentanyl. Transition to precedex to facilitate vent weaning -Full vent support -LTVV, 4-8cc/kg IBW with goal Pplat<30 and DP<15 -SBT/WUA. Likely plan to extubate this morning -CAP coverage. De-escalate pending culture data  CAD, HL -continue Asa -check echo -trop  flat at 19  Type 2 DM -hold home medications and insulin while hypoglycemic -D10 infusion -a1c  Hypokalemia -Repleted    Best Practice (right click and "Reselect all SmartList Selections" daily)   Diet/type: NPO DVT prophylaxis: LMWH GI prophylaxis: H2B Lines: N/A Foley:  Yes, and it is still needed Code Status:  full code Last date of multidisciplinary goals of care discussion [pending]   Critical care time:  46 minutes    The patient is critically ill with multiple organ systems failure and requires high complexity decision making for assessment and support, frequent evaluation and titration of therapies, application of advanced monitoring technologies and extensive interpretation of multiple databases.  Independent Critical Care Time: 56 Minutes.   Rodman Pickle, M.D. [facility] Pulmonary/Critical Care Medicine 08/28/2022 8:13 AM   Please see Amion for pager number to reach on-call Pulmonary and Critical Care Team.

## 2022-08-28 NOTE — Progress Notes (Addendum)
eLink Physician-Brief Progress Note Patient Name: [name] [name] DOB: 08-26-1970 MRN: [id number]   Date of Service  08/28/2022  HPI/Events of Note  Patient with hypoglycemia despite D 5 % water gtt at 125 ml / hour. SBP 80's. Na+ 134.  eICU Interventions  D 5 % water gtt discontinued and D 10 % water  gtt substituted. NS 1 liter iv bolus.     Intervention Category Evaluation Type: New Patient Evaluation  Frederik Pear 08/28/2022, 1:29 AM

## 2022-08-28 NOTE — Progress Notes (Signed)
Pt BG 58. 4 oz of orange juice given. Pt A&O x 4. VS stable. Denies dizziness or N/V.

## 2022-08-28 NOTE — Progress Notes (Signed)
EEG complete - results pending 

## 2022-08-29 ENCOUNTER — Inpatient Hospital Stay (HOSPITAL_COMMUNITY): Payer: Medicaid Other

## 2022-08-29 DIAGNOSIS — R52 Pain, unspecified: Secondary | ICD-10-CM | POA: Diagnosis not present

## 2022-08-29 DIAGNOSIS — M7989 Other specified soft tissue disorders: Secondary | ICD-10-CM

## 2022-08-29 DIAGNOSIS — E119 Type 2 diabetes mellitus without complications: Secondary | ICD-10-CM

## 2022-08-29 DIAGNOSIS — E162 Hypoglycemia, unspecified: Secondary | ICD-10-CM | POA: Diagnosis not present

## 2022-08-29 LAB — CBC
HCT: 38.8 % — ABNORMAL LOW (ref 39.0–52.0)
Hemoglobin: 13.6 g/dL (ref 13.0–17.0)
MCH: 31.6 pg (ref 26.0–34.0)
MCHC: 35.1 g/dL (ref 30.0–36.0)
MCV: 90.2 fL (ref 80.0–100.0)
Platelets: 226 10*3/uL (ref 150–400)
RBC: 4.3 MIL/uL (ref 4.22–5.81)
RDW: 12.9 % (ref 11.5–15.5)
WBC: 11 10*3/uL — ABNORMAL HIGH (ref 4.0–10.5)
nRBC: 0 % (ref 0.0–0.2)

## 2022-08-29 LAB — BASIC METABOLIC PANEL
Anion gap: 5 (ref 5–15)
BUN: 8 mg/dL (ref 6–20)
CO2: 23 mmol/L (ref 22–32)
Calcium: 7.9 mg/dL — ABNORMAL LOW (ref 8.9–10.3)
Chloride: 109 mmol/L (ref 98–111)
Creatinine, Ser: 0.97 mg/dL (ref 0.61–1.24)
GFR, Estimated: >60 mL/min (ref >60)
Glucose, Bld: 179 mg/dL — ABNORMAL HIGH (ref 70–99)
Potassium: 4.4 mmol/L (ref 3.5–5.1)
Sodium: 137 mmol/L (ref 135–145)

## 2022-08-29 LAB — GLUCOSE, CAPILLARY
Glucose-Capillary: 134 mg/dL — ABNORMAL HIGH (ref 70–99)
Glucose-Capillary: 140 mg/dL — ABNORMAL HIGH (ref 70–99)
Glucose-Capillary: 144 mg/dL — ABNORMAL HIGH (ref 70–99)
Glucose-Capillary: 154 mg/dL — ABNORMAL HIGH (ref 70–99)
Glucose-Capillary: 167 mg/dL — ABNORMAL HIGH (ref 70–99)
Glucose-Capillary: 167 mg/dL — ABNORMAL HIGH (ref 70–99)
Glucose-Capillary: 171 mg/dL — ABNORMAL HIGH (ref 70–99)
Glucose-Capillary: 176 mg/dL — ABNORMAL HIGH (ref 70–99)
Glucose-Capillary: 186 mg/dL — ABNORMAL HIGH (ref 70–99)
Glucose-Capillary: 207 mg/dL — ABNORMAL HIGH (ref 70–99)

## 2022-08-29 LAB — MAGNESIUM: Magnesium: 2 mg/dL (ref 1.7–2.4)

## 2022-08-29 MED ORDER — INSULIN ASPART 100 UNIT/ML IJ SOLN
0.0000 [IU] | Freq: Three times a day (TID) | INTRAMUSCULAR | Status: DC
  Administered 2022-08-29: 3 [IU] via SUBCUTANEOUS
  Administered 2022-08-30: 2 [IU] via SUBCUTANEOUS
  Administered 2022-08-30: 1 [IU] via SUBCUTANEOUS

## 2022-08-29 MED ORDER — ACETAMINOPHEN 325 MG PO TABS
650.0000 mg | ORAL_TABLET | Freq: Four times a day (QID) | ORAL | Status: DC | PRN
  Administered 2022-08-30: 650 mg via ORAL
  Filled 2022-08-29: qty 2

## 2022-08-29 NOTE — Progress Notes (Signed)
patient refused 12pm vital signs

## 2022-08-29 NOTE — Plan of Care (Signed)
  Problem: Education: Goal: Knowledge of General Education information will improve Description: Including pain rating scale, medication(s)/side effects and non-pharmacologic comfort measures Outcome: Progressing   Problem: Health Behavior/Discharge Planning: Goal: Ability to manage health-related needs will improve Outcome: Progressing   Problem: Clinical Measurements: Goal: Will remain free from infection Outcome: Progressing   Problem: Activity: Goal: Risk for activity intolerance will decrease Outcome: Progressing   Problem: Safety: Goal: Ability to remain free from injury will improve Outcome: Progressing   Problem: Clinical Measurements: Goal: Ability to maintain clinical measurements within normal limits will improve Outcome: Adequate for Discharge Goal: Diagnostic test results will improve Outcome: Adequate for Discharge Goal: Respiratory complications will improve Outcome: Adequate for Discharge Goal: Cardiovascular complication will be avoided Outcome: Adequate for Discharge   Problem: Nutrition: Goal: Adequate nutrition will be maintained Outcome: Adequate for Discharge   Problem: Pain Managment: Goal: General experience of comfort will improve Outcome: Adequate for Discharge

## 2022-08-29 NOTE — Progress Notes (Addendum)
[facility]  PROGRESS NOTE  [name] [name] [id number] DOB: January 20, 1971 DOA: 08/27/2022 PCP: Shanon Rosser, PA-C   Brief HPI:   52 year old male with history of COPD, CAD, diabetes mellitus type 2, hyperlipidemia was brought to the ED by EMS as he was found down.  Blood glucose was low at 26, improved with D50 up to 200s.  Patient was given Narcan in the ED but there was no improvement in mental status, he was intubated in the ED and transferred to ICU.  CT head was unremarkable.  Chest x-ray was clear.  He was extubated on 3/30 and transferred to medical floor. [facility] assumed  care on 3/31    Subjective   Patient seen and examined, complains of swelling in the left forearm.   Assessment/Plan:     Metabolic encephalopathy -Likely in setting of severe hypoglycemia -Similar presentation 6 months ago when he was admitted for syncope -Home meds include 80 units of NovoLog mix 70/30 every morning, 60 units every afternoon along with 15 units of Tresiba at bedtime, metformin 500 twice daily -Initially required D10; now he is off IV fluids -TSH, cortisol within normal limits -Echocardiogram obtained shows EF 55 to 123456, grade 1 diastolic dysfunction  Left arm swelling -Venous duplex of left upper extremity obtained no evidence of DVT in the upper extremity, showed superficial vein thrombosis involving right basilic vein  Diabetes mellitus type 2 Patient takes NovoLog mix, 70/30 80 units in morning, 60 units in the afternoon and 15 units of Tresiba  at bedtime -Last hemoglobin A1c from 7/23 was 13.7, will repeat A1c today -Currently home insulin regimen is on hold due to hypoglycemia -D10W was started and is currently stopped -Will continue to monitor patient CBG today -He will need adjustment of NovoLog Mix 70/30 and discontinue Tresiba at discharge.  Patient told me that he was taking NovoLog 70/30 80 units in the morning and 60 in the afternoon for a long time.  Tresiba 15 units  subcu at bedtime was added by PCP few weeks ago.  He also has variable intake due to esophageal dysmotility disorder and edentulous status.  Hemoglobin A1c ordered, sent out test to [facility].  Lab results should be back by tomorrow morning.  Likely discharge in a.m.  Will also consult diabetes coordinator.  Acute hypoxemic respiratory failure -Required intubation in the ED -CT chest without contrast with the right upper lobe atelectasis but no infiltrate -Successfully extubated -Started empirically on coverage for community-acquired pneumonia -Blood cultures x 2 showed no growth till date -Continue antibiotics for total 5 days treatment  Coronary artery disease -Continue aspirin, troponin was 19 -Echocardiogram showed no wall motion abnormality -Showed EF 55 to 123456, grade 1 diastolic dysfunction  Hypokalemia -Replete    Medications     Chlorhexidine Gluconate Cloth  6 each Topical Q0600   docusate sodium  50 mg Oral BID   enoxaparin (LOVENOX) injection  40 mg Subcutaneous Q24H   famotidine  20 mg Oral BID   polyethylene glycol  17 g Oral Daily     Data Reviewed:   CBG:  Recent Labs  Lab 08/29/22 0030 08/29/22 0231 08/29/22 0445 08/29/22 0619 08/29/22 0804  GLUCAP 176* 186* 167* 144* 140*    SpO2: 96 % O2 Flow Rate (L/min): 4 L/min FiO2 (%): 35 %    Vitals:   08/28/22 2237 08/29/22 0233 08/29/22 0450 08/29/22 0805  BP: (!) 156/74 (!) 156/79 (!) 158/86 (!) 154/85  Pulse: 97 96 94 94  Resp: Marland Kitchen)  21 18 (!) 22 18  Temp: 99.1 F (37.3 C) 99 F (37.2 C) 99.5 F (37.5 C) 99.3 F (37.4 C)  TempSrc: Oral Oral Oral Oral  SpO2: 94% 96% 96% 96%  Weight:      Height:          Data Reviewed:  Basic Metabolic Panel: Recent Labs  Lab 08/27/22 1854 08/28/22 0613 08/29/22 0014  NA 134* 139 137  K 3.9 3.4* 4.4  CL 103 110 109  CO2 24 19* 23  GLUCOSE 97 74 179*  BUN 12 8 8   CREATININE 0.96 0.89 0.97  CALCIUM 8.1* 7.4* 7.9*  MG  --  1.5* 2.0     CBC: Recent Labs  Lab 08/27/22 1854 08/28/22 0613 08/29/22 0014  WBC 14.3* 10.7* 11.0*  NEUTROABS 12.2*  --   --   HGB 14.9 12.7* 13.6  HCT 44.3 37.7* 38.8*  MCV 93.7 93.3 90.2  PLT 270 228 226    LFT Recent Labs  Lab 08/27/22 1854  AST 17  ALT 12  ALKPHOS 83  BILITOT 0.7  PROT 6.3*  ALBUMIN 3.2*     Antibiotics: Anti-infectives (From admission, onward)    Start     Dose/Rate Route Frequency Ordered Stop   08/28/22 0315  cefTRIAXone (ROCEPHIN) 1 g in sodium chloride 0.9 % 100 mL IVPB        1 g 200 mL/hr over 30 Minutes Intravenous Every 24 hours 08/28/22 0223     08/28/22 0315  azithromycin (ZITHROMAX) 500 mg in sodium chloride 0.9 % 250 mL IVPB        500 mg 250 mL/hr over 60 Minutes Intravenous Every 24 hours 08/28/22 0223     08/27/22 2030  vancomycin (VANCOREADY) IVPB 1750 mg/350 mL  Status:  Discontinued        1,750 mg 175 mL/hr over 120 Minutes Intravenous Every 24 hours 08/27/22 2020 08/28/22 0223   08/27/22 2015  cefTRIAXone (ROCEPHIN) 1 g in sodium chloride 0.9 % 100 mL IVPB        1 g 200 mL/hr over 30 Minutes Intravenous  Once 08/27/22 2002 08/27/22 2108   08/27/22 2015  azithromycin (ZITHROMAX) 500 mg in sodium chloride 0.9 % 250 mL IVPB        500 mg 250 mL/hr over 60 Minutes Intravenous  Once 08/27/22 2002 08/27/22 2237   08/27/22 2015  vancomycin (VANCOREADY) IVPB 1500 mg/300 mL  Status:  Discontinued        1,500 mg 150 mL/hr over 120 Minutes Intravenous  Once 08/27/22 2011 08/27/22 2020        DVT prophylaxis: Lovenox  Code Status: Full code  Family Communication:    CONSULTS PCCM   Objective    Physical Examination:   General: Appears in no acute distress Cardiovascular: S1-S2, regular, no murmur auscultated Respiratory: Lungs are clear to auscultation bilaterally Abdomen: Abdomen is soft, nontender, no organomegaly Extremities: No edema in the left upper extremity, no warmth noted, no erythema Neurologic: Alert,  oriented x 3, intact insight and judgment, no focal deficit noted   Status is: Inpatient:             [place]   [facility] If 7PM-7AM, please contact night-coverage at [email or web address], Office  [phone]   08/29/2022, 8:17 AM  LOS: 2 days

## 2022-08-29 NOTE — OR Nursing (Signed)
Patient accidentally pulled his IV. New site inserted and infiltrated 3 hours later while asleep. IV team consult placed for new start, Patient refusing to get a new IV because he will leave in the morning. Provided education on the importance to complete two intravenous antibiotics tonight prior to discharging but refuses teaching. IV Team Nurse present at the bedside unable to get a new site.

## 2022-08-29 NOTE — Progress Notes (Signed)
Pt. Refused for PIV access, RN called to bedside and explained the importance of  having his IV meds given once PIV is established. Pt. Still declined.a/ox4.

## 2022-08-29 NOTE — Progress Notes (Signed)
LUE venous duplex has been completed.  Preliminary findings given to Jeanette Caprice, Therapist, sports.   Results can be found under chart review under CV PROC. 08/29/2022 11:03 AM Michaelann Gunnoe RVT, RDMS

## 2022-08-29 NOTE — Progress Notes (Signed)
Mobility Specialist Progress Note    08/29/22 1115  Mobility  Activity Ambulated independently in hallway  Level of Assistance Standby assist, set-up cues, supervision of patient - no hands on  Assistive Device None  Distance Ambulated (ft) 420 ft  Activity Response Tolerated well  $Mobility charge 1 Mobility   Pt received in bed and agreeable. No complaints on walk. Returned to bed with call bell in reach.   Hildred Alamin Mobility Specialist  Please Psychologist, sport and exercise or Rehab Office at [phone]

## 2022-08-30 ENCOUNTER — Other Ambulatory Visit (HOSPITAL_COMMUNITY): Payer: Self-pay

## 2022-08-30 ENCOUNTER — Telehealth (HOSPITAL_COMMUNITY): Payer: Self-pay | Admitting: Pharmacy Technician

## 2022-08-30 DIAGNOSIS — E162 Hypoglycemia, unspecified: Secondary | ICD-10-CM | POA: Diagnosis not present

## 2022-08-30 LAB — GLUCOSE, CAPILLARY
Glucose-Capillary: 145 mg/dL — ABNORMAL HIGH (ref 70–99)
Glucose-Capillary: 150 mg/dL — ABNORMAL HIGH (ref 70–99)
Glucose-Capillary: 185 mg/dL — ABNORMAL HIGH (ref 70–99)

## 2022-08-30 LAB — HEMOGLOBIN A1C
Hgb A1c MFr Bld: 12.9 % — ABNORMAL HIGH (ref 4.8–5.6)
Hgb A1c MFr Bld: 13.2 % — ABNORMAL HIGH (ref 4.8–5.6)
Mean Plasma Glucose: 324 mg/dL
Mean Plasma Glucose: 332 mg/dL

## 2022-08-30 MED ORDER — INSULIN ASPART PROT & ASPART (70-30 MIX) 100 UNIT/ML ~~LOC~~ SUSP
3.0000 [IU] | Freq: Two times a day (BID) | SUBCUTANEOUS | Status: DC
  Administered 2022-08-30 (×2): 3 [IU] via SUBCUTANEOUS
  Filled 2022-08-30: qty 10

## 2022-08-30 MED ORDER — FREESTYLE LIBRE 3 SENSOR MISC: 1.0000 | 1 refills | Status: AC

## 2022-08-30 MED ORDER — FREESTYLE LIBRE 3 READER DEVI: 1.0000 | Freq: Once | 0 refills | Status: AC

## 2022-08-30 MED ORDER — AZITHROMYCIN 500 MG PO TABS: 500.0000 mg | ORAL_TABLET | Freq: Every day | ORAL | 0 refills | Status: AC

## 2022-08-30 MED ORDER — NOVOLOG MIX 70/30 FLEXPEN (70-30) 100 UNIT/ML ~~LOC~~ SUPN: PEN_INJECTOR | SUBCUTANEOUS | 0 refills | Status: DC

## 2022-08-30 MED ORDER — SERTRALINE HCL 100 MG PO TABS: 200.0000 mg | ORAL_TABLET | Freq: Every day | ORAL | Status: AC

## 2022-08-30 MED ORDER — CEFADROXIL 500 MG PO CAPS: 500.0000 mg | ORAL_CAPSULE | Freq: Two times a day (BID) | ORAL | 0 refills | Status: AC

## 2022-08-30 NOTE — Hospital Course (Addendum)
52 year old male with history of COPD, CAD, diabetes mellitus type 2, hyperlipidemia was brought to the ED by EMS as he was found down.  Blood glucose was low at 26, improved with D50 up to 200s.  Patient was given Narcan in the ED but there was no improvement in mental status, he was intubated in the ED and transferred to ICU.  CT head was unremarkable.  CXR-clear.  He was extubated on 3/30 and transferred to medical floor.TRH assumed  care on 3/31.Cbg stayed stable, DM Coordinator was consulted.

## 2022-08-30 NOTE — Progress Notes (Signed)
  Transition of Care (TOC) Screening Note   Patient Details  Name: [name] [name] Date of Birth: 01/31/1971   Transition of Care [facility]) CM/SW Contact:    Cyndi Bender, RN Phone Number: 08/30/2022, 9:03 AM    Transition of Care Department [facility]) has reviewed patient and no TOC needs have been identified at this time. We will continue to monitor patient advancement through interdisciplinary progression rounds. If new patient transition needs arise, please place a TOC consult.

## 2022-08-30 NOTE — Telephone Encounter (Signed)
Patient Advocate Encounter  Prior Authorization for [employer] has been approved.    PA# [id number] Confirmation:  [id number] W Insurance [place] Medicaid Effective dates: 08/30/2022 through 02/26/2023  Patients co-pay is $0.00.     Lyndel Safe, [place] Patient Advocate Specialist [place] Patient Advocate Team Direct Number: [phone]  Fax: [phone]

## 2022-08-30 NOTE — Discharge Summary (Signed)
Physician Discharge Summary  [name] [name] [id number] DOB: September 07, 1970 DOA: 08/27/2022  PCP: Shanon Rosser, PA-C  Admit date: 08/27/2022 Discharge date: 08/30/2022 Recommendations for Outpatient Follow-up:  Follow up with PCP in 1 weeks-call for appointment Please obtain BMP/CBC in one week  Discharge Dispo: Home Discharge Condition: Stable Code Status:   Code Status: Prior Diet recommendation:  Diet Order             Diet Carb Modified           Diet regular Room service appropriate? Yes; Fluid consistency: Thin  Diet effective now                    Brief/Interim Summary: 52 year old male with history of COPD, CAD, diabetes mellitus type 2, hyperlipidemia was brought to the ED by EMS as he was found down.  Blood glucose was low at 26, improved with D50 up to 200s.  Patient was given Narcan in the ED but there was no improvement in mental status, he was intubated in the ED and transferred to ICU.  CT head was unremarkable.  CXR-clear.  He was extubated on 3/30 and transferred to medical floor.TRH assumed  care on 3/31.Cbg stayed stable, DM Coordinator was consulted.  Patient was monitored blood sugar has been optimized placed back on mix insulin with instruction to slowly go up at home Instruction was discussed with patient's brother in detail-specifically advised to monitor for hypoglycemia, of note we were able to get approval for continuous insulin monitor/also device  Discharge Diagnoses:  Principal Problem:   Hypoglycemia  Acute metabolic encephalopathy likely due to severe hypoglycemia, similar presentation 6 months ago, was admitted for syncope.  Mental status improved now at baseline. TSH, cortisol within normal limits.Echocardiogram obtained shows EF 55 to 123456, grade 1 diastolic dysfunction   Left arm swelling:Venous duplex of left upper extremity obtained no evidence of DVT in the upper extremity, showed superficial vein thrombosis involving right basilic  vein-continue supportive care   Diabetes mellitus type 2 with hypoglycemia: PTA on NovoLog mix, 70/30 80 units in am, 60 units in afternoon  and 15 units of Tresiba  at bedtime. Last hemoglobin A1c from 7/23 was 13.7,> repeat A1c at 13.Insulin has been held due to hypoglycemia, hypoglycemia resolved off D10.  Coordinator consulted to adjust home meds.  It is unclear why he was placed on Tresiba 15 units at bedtime by PCP few wks ago, he reports he has also been taking NovoLog 70/30 80 u/ 60 u .  Placed back on insulin 70/30 at low dose- without hypoglycemia, discussed in detail about the need to slowly go up on insulin regimen and avoid long-acting for now and follow-up with PCP and monitor blood sugar closely at home.  I had cautiously advised and discussed with patient and his brother his blood sugar may continue to go up on low dose of mix insulin- so insulin dose needs to be adjusted accordingly. Blood sugar at the time of discharge was 185  Acute hypoxemic respiratory failure, needing intubation in the-due to metabolic encephalopathy..CT chest without contrast with the right upper lobe atelectasis but no infiltrate.  Extubated to room air blood culture negative current antibiotic for empiric community-acquired pneumonia complete course x 5 days  CAD: Cont aspirin, troponin was 19.Echocardiogram showed no wall motion abnormality, LVEF 55 to 123456, grade 1 diastolic dysfunction Hypokalemia:Repleted.  Consults: PCCM Subjective: Alert oriented x 3 patient has been eager to go home eating well, his brother was at  the bedside  Discharge Exam: Vitals:   08/30/22 0745 08/30/22 1127  BP: (!) 163/106 (!) 142/95  Pulse: 98 93  Resp: 16 16  Temp: 98.5 F (36.9 C) 98.1 F (36.7 C)  SpO2: 97% 99%   General: Pt is alert, awake, not in acute distress Cardiovascular: RRR, S1/S2 +, no rubs, no gallops Respiratory: CTA bilaterally, no wheezing, no rhonchi Abdominal: Soft, NT, ND, bowel sounds  + Extremities: no edema, no cyanosis  Discharge Instructions  Discharge Instructions     Ambulatory Referral for Lung Cancer Scre   Complete by: As directed    Diet Carb Modified   Complete by: As directed    Discharge instructions   Complete by: As directed    Please call call MD or return to ER for similar or worsening recurring problem that brought you to hospital or if any fever,nausea/vomiting,abdominal pain, uncontrolled pain, chest pain,  shortness of breath or any other alarming symptoms.  Please follow-up your doctor as instructed in a week time and call the office for appointment.  Please avoid alcohol, smoking, or any other illicit substance and maintain healthy habits including taking your regular medications as prescribed.  You were cared for by a hospitalist during your hospital stay. If you have any questions about your discharge medications or the care you received while you were in the hospital after you are discharged, you can call the unit and ask to speak with the hospitalist on call if the hospitalist that took care of you is not available.  Once you are discharged, your primary care physician will handle any further medical issues. Please note that NO REFILLS for any discharge medications will be authorized once you are discharged, as it is imperative that you return to your primary care physician (or establish a relationship with a primary care physician if you do not have one) for your aftercare needs so that they can reassess your need for medications and monitor your lab values     Your home dose of 80 units with breakfast and 60 units with supper is changed and slowly go up on the dose  Check blood sugar 3 times a day and bedtime at home. If blood sugar running above 200 less than 70 please call your MD to adjust insulin. If blood sugars running less 100 do not use insulin and call MD. If you noticed signs and symptoms of hypoglycemia or low blood sugar like  jitteriness, confusion, thirst, tremor, sweating- Check blood sugar, drink sugary drink/biscuits/sweets to increase sugar level and call MD or return to ER.   Increase activity slowly   Complete by: As directed       Allergies as of 08/30/2022       Reactions   Prilosec Otc [omeprazole Magnesium] Swelling   Face swells, no breathing impairment   Bee Venom Swelling   Nexium [esomeprazole] Swelling   Face swells, no breathing impairment   Morphine And Related Nausea And Vomiting, Rash   Itching at IV site, dry heaving, burning sensation in throat.        Medication List     STOP taking these medications    Semglee (yfgn) 100 UNIT/ML Pen Generic drug: insulin glargine-yfgn       TAKE these medications    acetaminophen 500 MG tablet Commonly known as: TYLENOL Take 1,000 mg by mouth 2 (two) times daily as needed for moderate pain, fever or headache.   albuterol 108 (90 Base) MCG/ACT inhaler Commonly known as: VENTOLIN  HFA Inhale 2 puffs into the lungs every 6 (six) hours as needed for wheezing or shortness of breath.   ALPRAZolam 0.5 MG tablet Commonly known as: XANAX Take 0.25-0.5 mg by mouth See admin instructions. 0.5 mg at bedtime. 0.25-0.5 mg twice daily as needed for anxiety.   ARIPiprazole 10 MG tablet Commonly known as: ABILIFY Take 10 mg by mouth daily.   aspirin EC 81 MG tablet Take 1 tablet by mouth daily with breakfast.   atorvastatin 80 MG tablet Commonly known as: LIPITOR TAKE 1 Tablet BY MOUTH ONCE EVERY DAY What changed: See the new instructions.   azithromycin 500 MG tablet Commonly known as: Zithromax Take 1 tablet (500 mg total) by mouth daily for 2 days. Take 1 tablet daily for 3 days.   budesonide-formoterol 80-4.5 MCG/ACT inhaler Commonly known as: Symbicort Take 2 puffs first thing in am and then another 2 puffs about 12 hours later. What changed:  how much to take how to take this when to take this reasons to take this additional  instructions   cefadroxil 500 MG capsule Commonly known as: DURICEF Take 1 capsule (500 mg total) by mouth 2 (two) times daily for 2 days.   Dexcom G7 Receiver Devi Use to monitor BG continuously What changed: Another medication with the same name was added. Make sure you understand how and when to take each.   FreeStyle Libre 3 Reader Devi 1 each by Does not apply route once for 1 dose. What changed: You were already taking a medication with the same name, and this prescription was added. Make sure you understand how and when to take each.   Dexcom G7 Sensor Misc Change sensor every 10 days What changed: Another medication with the same name was added. Make sure you understand how and when to take each.   FreeStyle Libre 3 Sensor Misc 1 each by Does not apply route every 14 (fourteen) days. Place 1 sensor on the skin every 14 days. Use to check glucose continuously What changed: You were already taking a medication with the same name, and this prescription was added. Make sure you understand how and when to take each.   famotidine 40 MG tablet Commonly known as: PEPCID Take 1 tablet (40 mg total) by mouth 2 (two) times daily. What changed: when to take this   gabapentin 300 MG capsule Commonly known as: Neurontin Take 1 capsule (300 mg total) by mouth every 8 (eight) hours as needed. What changed: reasons to take this   linaclotide 145 MCG Caps capsule Commonly known as: Linzess Take 1 capsule (145 mcg total) by mouth daily before breakfast.   metFORMIN 500 MG tablet Commonly known as: GLUCOPHAGE TAKE 1 Tablet  BY MOUTH TWICE DAILY WITH A MEAL   midodrine 10 MG tablet Commonly known as: PROAMATINE Take 1 tablet (10 mg total) by mouth 3 (three) times daily.   nitroGLYCERIN 0.4 MG SL tablet Commonly known as: NITROSTAT Place 1 tablet (0.4 mg total) under the tongue every 5 (five) minutes as needed for chest pain.   NovoLOG Mix 70/30 FlexPen (70-30) 100 UNIT/ML  FlexPen Generic drug: insulin aspart protamine - aspart Take 10 u BID with meal and slwoly go up to home dose as your oral intake picks up- follow up with your Primary Care doctor. What changed: additional instructions   polyethylene glycol 17 g packet Commonly known as: MIRALAX / GLYCOLAX Take 17 g by mouth daily. What changed:  when to take this reasons to  take this   prochlorperazine 10 MG tablet Commonly known as: COMPAZINE Take 1 tablet (10 mg total) by mouth every 6 (six) hours as needed for nausea or vomiting.   promethazine 25 MG tablet Commonly known as: PHENERGAN Take 25 mg by mouth daily as needed for nausea or vomiting.   sertraline 100 MG tablet Commonly known as: ZOLOFT Take 2 tablets (200 mg total) by mouth at bedtime. What changed: how much to take   traZODone 50 MG tablet Commonly known as: DESYREL Take 50 mg by mouth at bedtime.   Vonoprazan Fumarate 20 MG Tabs Take 20 mg by mouth daily. Take 20 mg by mouth daily. For 8 weeks, then take 10 mg daily.        Allergies  Allergen Reactions   Prilosec Otc [Omeprazole Magnesium] Swelling    Face swells, no breathing impairment   Bee Venom Swelling   Nexium [Esomeprazole] Swelling    Face swells, no breathing impairment   Morphine And Related Nausea And Vomiting and Rash    Itching at IV site, dry heaving, burning sensation in throat.    The results of significant diagnostics from this hospitalization (including imaging, microbiology, ancillary and laboratory) are listed below for reference.    Microbiology: Recent Results (from the past 240 hour(s))  Blood culture (routine x 2)     Status: None (Preliminary result)   Collection Time: 08/27/22  7:51 PM   Specimen: BLOOD  Result Value Ref Range Status   Specimen Description BLOOD BLOOD LEFT HAND  Final   Special Requests NONE  Final   Culture   Final    NO GROWTH 3 DAYS Performed at [facility], [address] [place], [place] [address]     Report Status PENDING  Incomplete  Blood culture (routine x 2)     Status: None (Preliminary result)   Collection Time: 08/27/22  7:57 PM   Specimen: BLOOD  Result Value Ref Range Status   Specimen Description BLOOD BLOOD RIGHT HAND  Final   Special Requests NONE  Final   Culture   Final    NO GROWTH 3 DAYS Performed at [facility], [address] [place], [place] [address]    Report Status PENDING  Incomplete  MRSA Next Gen by PCR, Nasal     Status: None   Collection Time: 08/28/22  1:08 AM   Specimen: Nasal Mucosa; Nasal Swab  Result Value Ref Range Status   MRSA by PCR Next Gen NOT DETECTED NOT DETECTED Final    Comment: (NOTE) The GeneXpert MRSA Assay (FDA approved for NASAL specimens only), is one component of a comprehensive MRSA colonization surveillance program. It is not intended to diagnose MRSA infection nor to guide or monitor treatment for MRSA infections. Test performance is not FDA approved in patients less than 41 years old. Performed at [facility], [place] [address] [place], [place] [address]     Procedures/Studies: VAS [place] UPPER EXTREMITY VENOUS DUPLEX  Result Date: 08/30/2022 UPPER VENOUS STUDY  Patient Name:  ANGELL GAINOUS  Date of Exam:   08/29/2022 Medical Rec #: [id number]        Accession #:    [id number] Date of Birth: [birth year]        Patient Gender: M Patient Age:   52 years Exam Location:  [facility] Procedure:      VAS [place] UPPER EXTREMITY VENOUS DUPLEX Referring Phys: Frederich Chick LAMA --------------------------------------------------------------------------------  Indications: Pain, and Swelling Other Indications: Recent LUE  IV. Comparison Study: No previous exams Performing Technologist: Jody Hill RVT, RDMS  Examination Guidelines: A complete evaluation includes B-mode imaging, spectral Doppler, color Doppler, and power Doppler as needed of all accessible portions of each vessel. Bilateral testing is considered an integral part of a complete  examination. Limited examinations for reoccurring indications may be performed as noted.  Right Findings: +----------+------------+---------+-----------+----------+-------+ RIGHT     CompressiblePhasicitySpontaneousPropertiesSummary +----------+------------+---------+-----------+----------+-------+ Subclavian    Full       Yes       Yes                      +----------+------------+---------+-----------+----------+-------+  Left Findings: +----------+------------+---------+-----------+----------+-------+ LEFT      CompressiblePhasicitySpontaneousPropertiesSummary +----------+------------+---------+-----------+----------+-------+ IJV           Full       Yes       Yes                      +----------+------------+---------+-----------+----------+-------+ Subclavian    Full       Yes       Yes                      +----------+------------+---------+-----------+----------+-------+ Axillary      Full       Yes       Yes                      +----------+------------+---------+-----------+----------+-------+ Brachial      Full       Yes       Yes                      +----------+------------+---------+-----------+----------+-------+ Radial        Full                                          +----------+------------+---------+-----------+----------+-------+ Ulnar         Full                                          +----------+------------+---------+-----------+----------+-------+ Cephalic      Full                                          +----------+------------+---------+-----------+----------+-------+ Basilic       None       No        No                Acute  +----------+------------+---------+-----------+----------+-------+  Summary:  Left: No evidence of thrombosis in the subclavian. Findings consistent with acute superficial vein thrombosis involving the left basilic vein. Subcutaneous edema extending from mid upper arm into forearm.   Right: No evidence of deep vein thrombosis in the upper extremity.  *See table(s) above for measurements and observations.  Diagnosing physician: Monica Martinez MD Electronically signed by Monica Martinez MD on 08/30/2022 at 10:52:15 AM.    Final    ECHOCARDIOGRAM COMPLETE  Result Date: 08/28/2022    ECHOCARDIOGRAM REPORT   Patient Name:   RUSTAM SQUILLACE Date of Exam: 08/28/2022 Medical Rec #:  [id number]  Height:       68.0 in Accession #:    [id number]      Weight:       161.2 lb Date of Birth:  [birth year]       BSA:          1.865 m Patient Age:    71 years        BP:           154/90 mmHg Patient Gender: M               HR:           73 bpm. Exam Location:  Inpatient Procedure: 2D Echo, Cardiac Doppler and Color Doppler Indications:     Syncope R55  History:         Patient has no prior history of Echocardiogram examinations.                  CAD, COPD; Risk Factors:Current Smoker, Hypertension, Diabetes                  and Dyslipidemia.  Sonographer:     Wilkie Aye RVT RCS Referring Phys:  [id number] Francesca Jewett Diagnosing Phys: Gwyndolyn Kaufman MD IMPRESSIONS  1. Left ventricular ejection fraction, by estimation, is 55 to 60%. The left ventricle has normal function. The left ventricle has no regional wall motion abnormalities. There is mild concentric left ventricular hypertrophy. Left ventricular diastolic parameters are consistent with Grade I diastolic dysfunction (impaired relaxation).  2. Right ventricular systolic function is normal. The right ventricular size is normal. Tricuspid regurgitation signal is inadequate for assessing PA pressure.  3. The mitral valve leaflets appear mildly thickened for age. . The mitral valve is abnormal. No evidence of mitral valve regurgitation. No evidence of mitral stenosis.  4. The aortic valve is tricuspid. Aortic valve regurgitation is not visualized. Aortic valve sclerosis is present, with no evidence of aortic valve stenosis.  5. The inferior vena  cava is dilated in size with <50% respiratory variability, suggesting right atrial pressure of 15 mmHg. Comparison(s): No prior Echocardiogram. FINDINGS  Left Ventricle: Left ventricular ejection fraction, by estimation, is 55 to 60%. The left ventricle has normal function. The left ventricle has no regional wall motion abnormalities. The left ventricular internal cavity size was normal in size. There is  mild concentric left ventricular hypertrophy. Left ventricular diastolic parameters are consistent with Grade I diastolic dysfunction (impaired relaxation). Right Ventricle: The right ventricular size is normal. No increase in right ventricular wall thickness. Right ventricular systolic function is normal. Tricuspid regurgitation signal is inadequate for assessing PA pressure. Left Atrium: Left atrial size was normal in size. Right Atrium: Right atrial size was normal in size. Pericardium: There is no evidence of pericardial effusion. Mitral Valve: The mitral valve leaflets appear mildly thickened for age. The mitral valve is abnormal. There is mild thickening of the mitral valve leaflet(s). No evidence of mitral valve regurgitation. No evidence of mitral valve stenosis. Tricuspid Valve: The tricuspid valve is normal in structure. Tricuspid valve regurgitation is trivial. Aortic Valve: The aortic valve is tricuspid. Aortic valve regurgitation is not visualized. Aortic valve sclerosis is present, with no evidence of aortic valve stenosis. Aortic valve mean gradient measures 3.0 mmHg. Aortic valve peak gradient measures 5.4  mmHg. Aortic valve area, by VTI measures 2.99 cm. Pulmonic Valve: The pulmonic valve was normal in structure. Pulmonic valve regurgitation is trivial. Aorta: The aortic root and ascending aorta are structurally normal,  with no evidence of dilitation. Venous: The inferior vena cava is dilated in size with less than 50% respiratory variability, suggesting right atrial pressure of 15 mmHg.  IAS/Shunts: The atrial septum is grossly normal.  LEFT VENTRICLE PLAX 2D LVIDd:         4.10 cm   Diastology LVIDs:         2.80 cm   LV e' medial:    6.69 cm/s LV PW:         1.60 cm   LV E/e' medial:  12.1 LV IVS:        0.90 cm   LV e' lateral:   8.11 cm/s LVOT diam:     2.20 cm   LV E/e' lateral: 10.0 LV SV:         65 LV SV Index:   35 LVOT Area:     3.80 cm  RIGHT VENTRICLE            IVC RV Basal diam:  3.00 cm    IVC diam: 2.20 cm RV Mid diam:    2.40 cm RV S prime:     7.38 cm/s TAPSE (M-mode): 1.6 cm LEFT ATRIUM             Index        RIGHT ATRIUM           Index LA diam:        3.80 cm 2.04 cm/m   RA Area:     13.30 cm LA Vol (A2C):   33.5 ml 17.97 ml/m  RA Volume:   33.10 ml  17.75 ml/m LA Vol (A4C):   36.3 ml 19.47 ml/m LA Biplane Vol: 35.4 ml 18.99 ml/m  AORTIC VALVE                    PULMONIC VALVE AV Area (Vmax):    2.92 cm     PV Vmax:       0.82 m/s AV Area (Vmean):   2.86 cm     PV Peak grad:  2.7 mmHg AV Area (VTI):     2.99 cm AV Vmax:           116.00 cm/s AV Vmean:          76.700 cm/s AV VTI:            0.219 m AV Peak Grad:      5.4 mmHg AV Mean Grad:      3.0 mmHg LVOT Vmax:         89.10 cm/s LVOT Vmean:        57.800 cm/s LVOT VTI:          0.172 m LVOT/AV VTI ratio: 0.79  AORTA Ao Root diam: 3.00 cm Ao Asc diam:  3.20 cm Ao Arch diam: 2.8 cm MITRAL VALVE MV Area (PHT): 3.93 cm    SHUNTS MV Decel Time: 193 msec    Systemic VTI:  0.17 m MV E velocity: 80.90 cm/s  Systemic Diam: 2.20 cm MV A velocity: 89.10 cm/s MV E/A ratio:  0.91 Gwyndolyn Kaufman MD Electronically signed by Gwyndolyn Kaufman MD Signature Date/Time: 08/28/2022/11:55:48 AM    Final (Updated)    DG Abd 1 View  Result Date: 08/28/2022 CLINICAL DATA:  52 year old male status post orogastric tube placement. EXAM: ABDOMEN - 1 VIEW COMPARISON:  Acute abdominal series 04/21/2021. FINDINGS: Enteric tube with tip in the right upper quadrant of the abdomen, either in the pre-pyloric antrum or  the proximal duodenum.  Visualized bowel gas pattern is nonobstructive. IMPRESSION: 1. Enteric tube in position, as above. Retraction of the tube approximately 8 cm for more optimal placement ensuring gastric positioning is suggested. Electronically Signed   By: Vinnie Langton M.D.   On: 08/28/2022 08:00   EEG adult  Result Date: 08/28/2022 Lora Havens, MD     08/28/2022  7:45 AM Patient Name: ADRITH ACKERMAN MRN: [id number] Epilepsy Attending: Lora Havens Referring Physician/Provider: Collier Bullock, MD Date: 08/28/2022 Duration: 22.17 mins Patient history: 52yo M with ams. EEG to evaluate for seizure Level of alertness:  comatose AEDs during EEG study: Propofol Technical aspects: This EEG study was done with scalp electrodes positioned according to the 10-20 International system of electrode placement. Electrical activity was reviewed with band pass filter of 1-70Hz , sensitivity of 7 uV/mm, display speed of 6mm/sec with a 60Hz  notched filter applied as appropriate. EEG data were recorded continuously and digitally stored.  Video monitoring was available and reviewed as appropriate. Description: EEG showed continuous generalized 3 to 6 Hz theta-delta slowing admixed with an excessive amount of 15 to 18 Hz beta activity distributed symmetrically and diffusely. Hyperventilation and photic stimulation were not performed.   ABNORMALITY - Continuous slow, generalized IMPRESSION: This study is suggestive of severe diffuse encephalopathy most likely related to sedation. No seizures or epileptiform discharges were seen throughout the recording. Lora Havens   CT Chest W Contrast  Result Date: 08/27/2022 CLINICAL DATA:  Abnormal x-ray EXAM: CT CHEST WITH CONTRAST TECHNIQUE: Multidetector CT imaging of the chest was performed during intravenous contrast administration. RADIATION DOSE REDUCTION: This exam was performed according to the departmental dose-optimization program which includes automated exposure control,  adjustment of the mA and/or kV according to patient size and/or use of iterative reconstruction technique. CONTRAST:  16mL OMNIPAQUE IOHEXOL 300 MG/ML  SOLN COMPARISON:  Chest x-ray 08/27/2022 FINDINGS: Cardiovascular: Nonaneurysmal aorta. Mild coronary vascular calcification. Normal cardiac size. Trace pericardial effusion Mediastinum/Nodes: Endotracheal tube tip about 2.6 cm superior to the carina. Esophagus unremarkable. No suspicious lymph nodes. Negative thyroid Lungs/Pleura: Minimal atelectasis or scarring in the right apical lung. Mild bilateral bronchial wall thickening. No consolidation, pleural effusion or pneumothorax. Upper Abdomen: No acute abnormality. Musculoskeletal: No chest wall abnormality. No acute or significant osseous findings. IMPRESSION: 1. No CT evidence for acute intrathoracic abnormality. 2. Mild bilateral bronchial wall thickening. Minimal right upper lobe atelectasis. Electronically Signed   By: Donavan Foil M.D.   On: 08/27/2022 21:09   CT Head Wo Contrast  Result Date: 08/27/2022 CLINICAL DATA:  Memory loss EXAM: CT HEAD WITHOUT CONTRAST TECHNIQUE: Contiguous axial images were obtained from the base of the skull through the vertex without intravenous contrast. RADIATION DOSE REDUCTION: This exam was performed according to the departmental dose-optimization program which includes automated exposure control, adjustment of the mA and/or kV according to patient size and/or use of iterative reconstruction technique. COMPARISON:  CT 01/18/2022 FINDINGS: Brain: No acute territorial infarction, hemorrhage or intracranial mass. Stable ventricle size Vascular: No hyperdense vessels.  Carotid vascular calcification. Skull: Normal. Negative for fracture or focal lesion. Sinuses/Orbits: Mucosal thickening in the sinuses. Other: None IMPRESSION: Negative non contrasted CT appearance of the brain. Electronically Signed   By: Donavan Foil M.D.   On: 08/27/2022 21:03   DG Chest Port 1  View  Result Date: 08/27/2022 CLINICAL DATA:  Endotracheal tube placement EXAM: PORTABLE CHEST 1 VIEW COMPARISON:  Previous studies including the examination done earlier today FINDINGS: There is  a decrease in volume in right lung. Right hemidiaphragm is elevated. There is possible air trapping in the left lung. Lateral aspect of right lung is not included limiting evaluation. There are possible patchy infiltrates in right lung. There is shift of mediastinum to the right. Tip of endotracheal tube is 4.3 cm above the carina. NG tube is noted traversing the esophagus. IMPRESSION: Tip of endotracheal tube is 4.3 cm above the carina. NG tube is noted traversing the esophagus. There is interval decrease in volume in right lung and possible air trapping in the left lung. Possibility of evolving partial atelectasis in right lung should be considered. Right lung is not included in its entirety limiting evaluation. Repeat AP and lateral views including the entire right lung along with CT if warranted should be considered for further evaluation. Electronically Signed   By: Elmer Picker M.D.   On: 08/27/2022 19:51   DG Chest Port 1 View  Result Date: 08/27/2022 CLINICAL DATA:  Shortness of breath. EXAM: PORTABLE CHEST 1 VIEW COMPARISON:  Chest radiograph dated 01/18/2022. FINDINGS: Ill-defined opacity in the right apex may represent pneumonia. Repeat radiograph with better inspiration and PA and lateral views may provide better evaluation. There are bibasilar atelectasis. No pleural effusion or pneumothorax. The cardiac silhouette is within normal limits. No acute osseous pathology. IMPRESSION: Ill-defined opacity in the right apex may represent pneumonia. Further evaluation with two-view radiograph or CT or follow-up to resolution recommended. Electronically Signed   By: Anner Crete M.D.   On: 08/27/2022 19:13   DG Chest Port 1 View  Result Date: 08/27/2022 CLINICAL DATA:  Hypoglycemia EXAM: PORTABLE  CHEST 1 VIEW COMPARISON:  Chest radiograph dated 08/15/2010 FINDINGS: Patient is rotated to the right. Asymmetrically lower right lung volumes with hazy right apical opacity. Hazy rounded left lower lung opacity. No pleural effusion or pneumothorax. The heart size and mediastinal contours are within normal limits. The visualized skeletal structures are unremarkable. IMPRESSION: 1. Asymmetrically lower right lung volumes with hazy right apical opacity, which may be due to patient rotation and atelectasis. 2. Hazy rounded left lower lung opacity, which may be artifactual related to patient positioning. Attention on follow-up. Electronically Signed   By: Darrin Nipper M.D.   On: 08/27/2022 18:51    Labs: BNP (last 3 results) Recent Labs    12/01/21 1822  BNP [id number]*   Basic Metabolic Panel: Recent Labs  Lab 08/27/22 1854 08/28/22 0613 08/29/22 0014  NA 134* 139 137  K 3.9 3.4* 4.4  CL 103 110 109  CO2 24 19* 23  GLUCOSE 97 74 179*  BUN 12 8 8   CREATININE 0.96 0.89 0.97  CALCIUM 8.1* 7.4* 7.9*  MG  --  1.5* 2.0   Liver Function Tests: Recent Labs  Lab 08/27/22 1854  AST 17  ALT 12  ALKPHOS 83  BILITOT 0.7  PROT 6.3*  ALBUMIN 3.2*   No results for input(s): "LIPASE", "AMYLASE" in the last 168 hours. No results for input(s): "AMMONIA" in the last 168 hours. CBC: Recent Labs  Lab 08/27/22 1854 08/28/22 0613 08/29/22 0014  WBC 14.3* 10.7* 11.0*  NEUTROABS 12.2*  --   --   HGB 14.9 12.7* 13.6  HCT 44.3 37.7* 38.8*  MCV 93.7 93.3 90.2  PLT 270 228 226   Cardiac Enzymes: Recent Labs  Lab 08/27/22 1854  CKTOTAL 60   BNP: Invalid input(s): "POCBNP" CBG: Recent Labs  Lab 08/29/22 1407 08/29/22 1602 08/29/22 2119 08/30/22 0626 08/30/22 1039  GLUCAP 167*  207* 171* 145* 150*   D-Dimer No results for input(s): "DDIMER" in the last 72 hours. Hgb A1c Recent Labs    08/28/22 0613 08/29/22 0013  HGBA1C 12.9* 13.2*   Lipid Profile No results for input(s): "CHOL",  "HDL", "LDLCALC", "TRIG", "CHOLHDL", "LDLDIRECT" in the last 72 hours. Thyroid function studies Recent Labs    08/28/22 0613  TSH 1.378   Anemia work up No results for input(s): "VITAMINB12", "FOLATE", "FERRITIN", "TIBC", "IRON", "RETICCTPCT" in the last 72 hours. Urinalysis    Component Value Date/Time   COLORURINE STRAW (A) 08/27/2022 2005   APPEARANCEUR CLEAR 08/27/2022 2005   APPEARANCEUR Clear 01/23/2021 0904   LABSPEC 1.006 08/27/2022 2005   PHURINE 5.0 08/27/2022 2005   GLUCOSEU 150 (A) 08/27/2022 2005   HGBUR MODERATE (A) 08/27/2022 2005   BILIRUBINUR NEGATIVE 08/27/2022 2005   BILIRUBINUR negative 02/25/2021 1121   BILIRUBINUR Negative 01/23/2021 0904   KETONESUR NEGATIVE 08/27/2022 2005   PROTEINUR 100 (A) 08/27/2022 2005   UROBILINOGEN 0.2 02/25/2021 1121   NITRITE NEGATIVE 08/27/2022 2005   LEUKOCYTESUR NEGATIVE 08/27/2022 2005   Sepsis Labs Recent Labs  Lab 08/27/22 1854 08/28/22 0613 08/29/22 0014  WBC 14.3* 10.7* 11.0*   Microbiology Recent Results (from the past 240 hour(s))  Blood culture (routine x 2)     Status: None (Preliminary result)   Collection Time: 08/27/22  7:51 PM   Specimen: BLOOD  Result Value Ref Range Status   Specimen Description BLOOD BLOOD LEFT HAND  Final   Special Requests NONE  Final   Culture   Final    NO GROWTH 3 DAYS Performed at [facility], [address], [place], [place] [address]    Report Status PENDING  Incomplete  Blood culture (routine x 2)     Status: None (Preliminary result)   Collection Time: 08/27/22  7:57 PM   Specimen: BLOOD  Result Value Ref Range Status   Specimen Description BLOOD BLOOD RIGHT HAND  Final   Special Requests NONE  Final   Culture   Final    NO GROWTH 3 DAYS Performed at [facility], [address], [place], [place] [address]    Report Status PENDING  Incomplete  MRSA Next Gen by PCR, Nasal     Status: None   Collection Time: 08/28/22  1:08 AM   Specimen: Nasal Mucosa; Nasal Swab   Result Value Ref Range Status   MRSA by PCR Next Gen NOT DETECTED NOT DETECTED Final    Comment: (NOTE) The GeneXpert MRSA Assay (FDA approved for NASAL specimens only), is one component of a comprehensive MRSA colonization surveillance program. It is not intended to diagnose MRSA infection nor to guide or monitor treatment for MRSA infections. Test performance is not FDA approved in patients less than 68 years old. Performed at [facility], [place] [address] [place], [place] [address]   Time coordinating discharge: 25 minutes  SIGNED: Antonieta Pert, MD  [facility] 08/30/2022, 3:29 PM  If 7PM-7AM, please contact night-coverage [email or web address]

## 2022-08-30 NOTE — Inpatient Diabetes Management (Signed)
Inpatient Diabetes Program Recommendations  AACE/ADA: New Consensus Statement on Inpatient Glycemic Control (2015)  Target Ranges:  Prepandial:   less than 140 mg/dL      Peak postprandial:   less than 180 mg/dL (1-2 hours)      Critically ill patients:  140 - 180 mg/dL   Lab Results  Component Value Date   GLUCAP 150 (H) 08/30/2022   HGBA1C 13.2 (H) 08/29/2022    Review of Glycemic Control  Diabetes history: DM 2 Outpatient Diabetes medications: 70/30 60 units qam, 60 units qpm, Tresiba 15 units  A1c 13.2% on 3/31  Spoke with pt at bedside regarding A1c level and glucose control at home. Pt reports not taking insulin as prescribed pt also reported not checking glucose levels at home. Discussed importance of glucose control at home. Encouraged glucose monitoring and taking medication consistently to dose medication more accurately. Pt concerned over hypoglycemia at home. Discussed CGM for home use. Showed pt how to apply sensor. Pt to follow up with Shanon Rosser, PA  outpt for diabetes management.Pt has sen endocrinology (Dr. Dorris Fetch last visit on 02/20/22). Reviewed lifestyle modifications.   Thanks,  Tama Headings RN, MSN, BC-ADM Inpatient Diabetes Coordinator Team Pager [phone] (8a-5p)

## 2022-08-30 NOTE — Plan of Care (Signed)

## 2022-09-01 LAB — CULTURE, BLOOD (ROUTINE X 2)
Culture: NO GROWTH
Culture: NO GROWTH

## 2022-09-08 IMAGING — US US SCROTUM
1 series · 14 of 25 positions shown · non-contrast
Comparison: CT 11/07/2018

CLINICAL DATA: Testicular mass, abscess posterior scrotum

EXAM:
ULTRASOUND OF SCROTUM
TECHNIQUE: Complete ultrasound examination of the testicles, epididymis, and
other scrotal structures was performed.

[Series 1: us scrotum · 88 acquisitions, 14 frames shown]
[im 1/88]
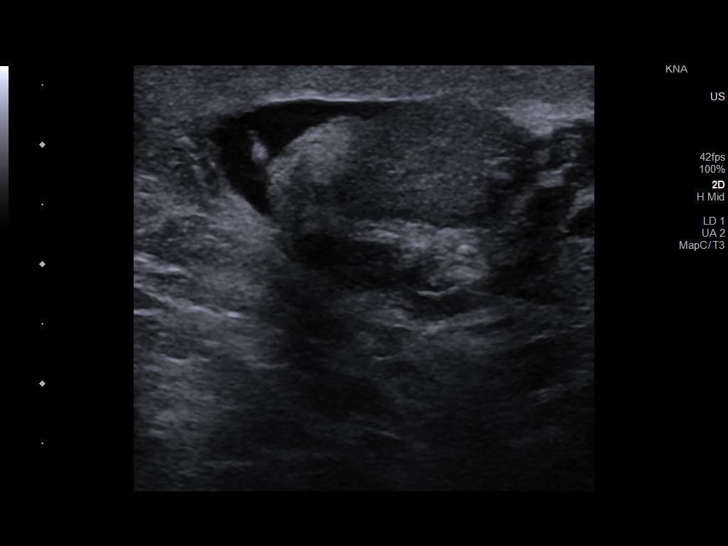
[im 8/88]
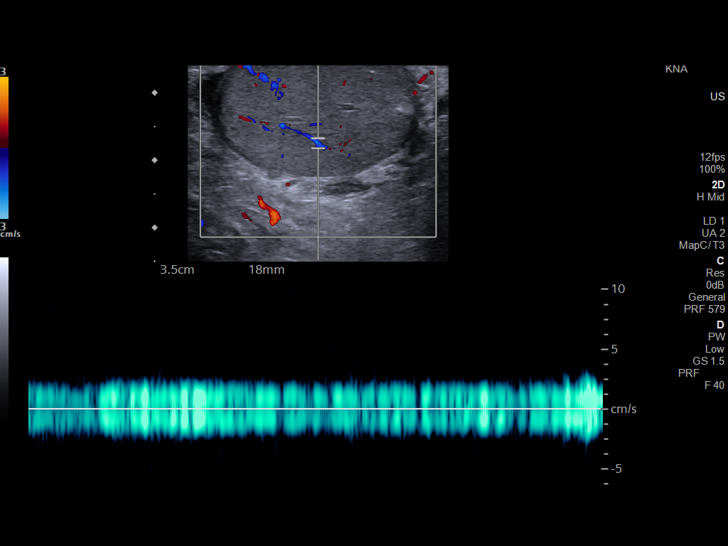
[im 15/88]
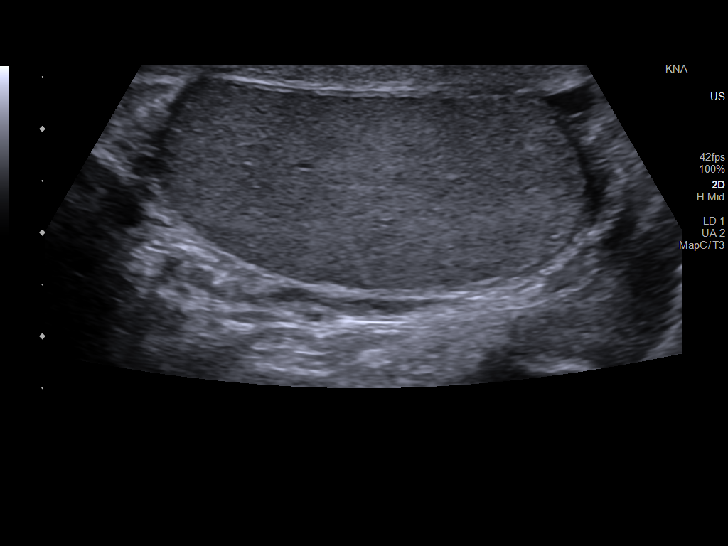
[im 22/88]
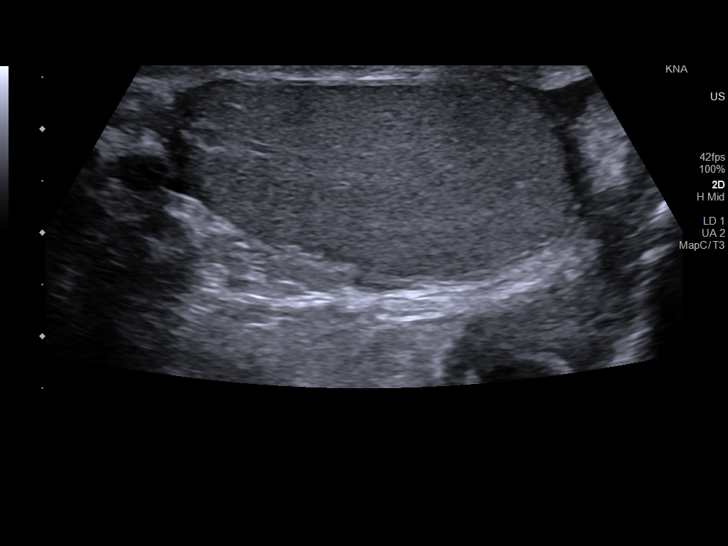
[im 30/88]
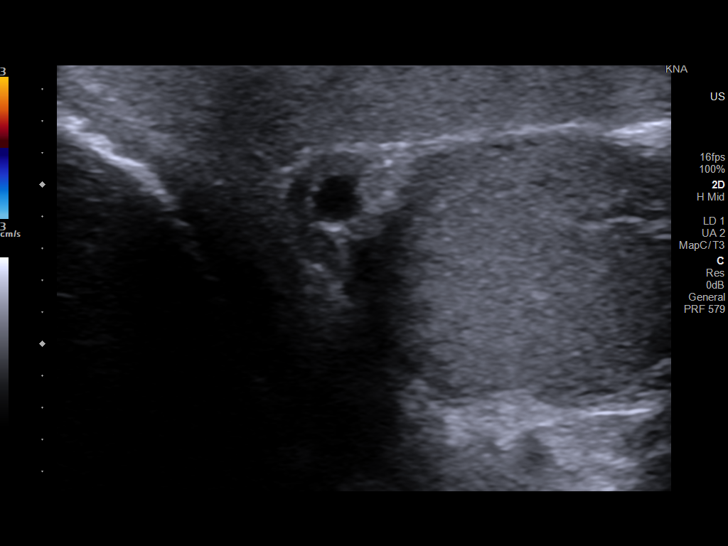
[im 33/88]
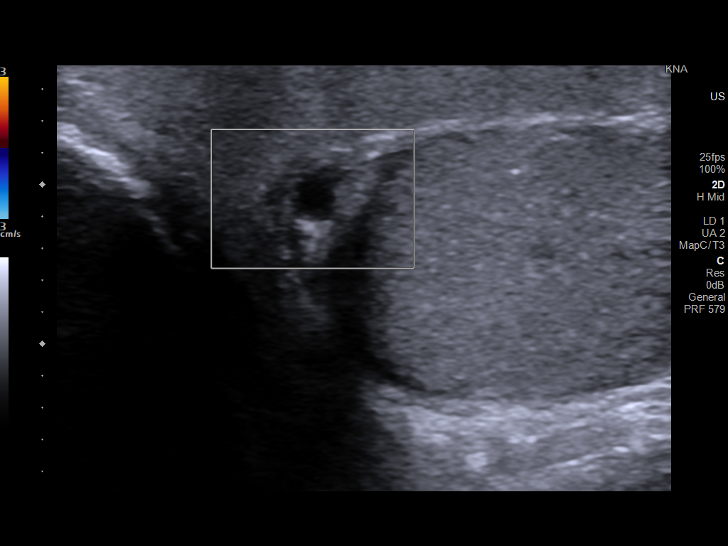
[im 40/88]
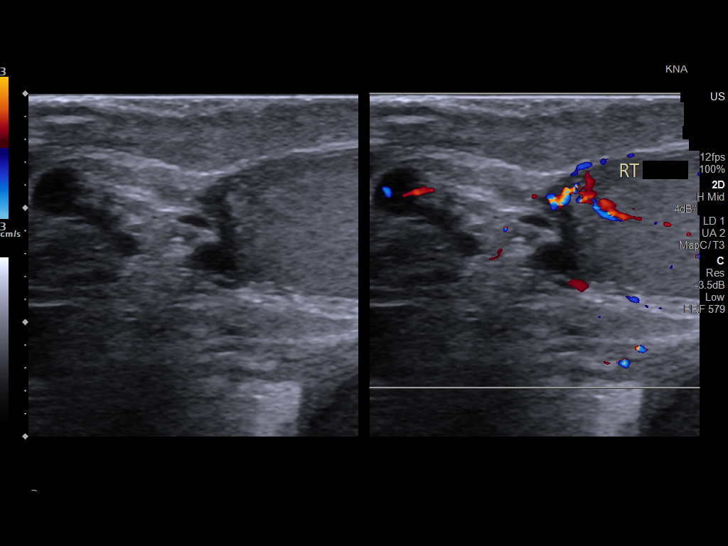
[im 48/88]
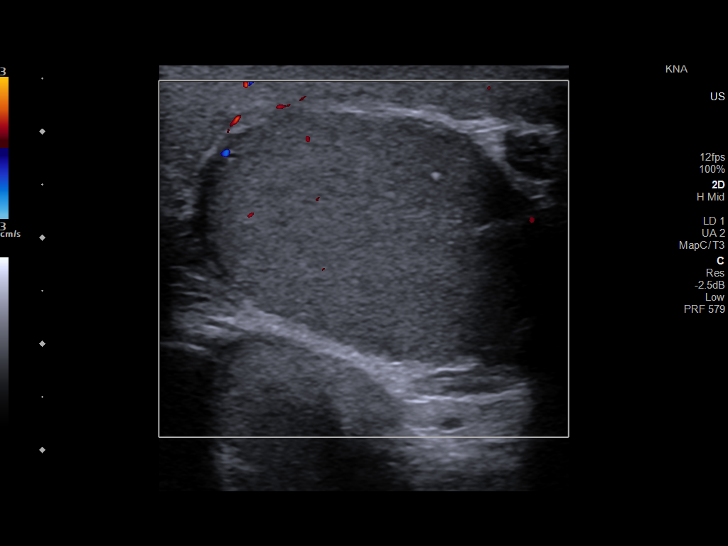
[im 55/88]
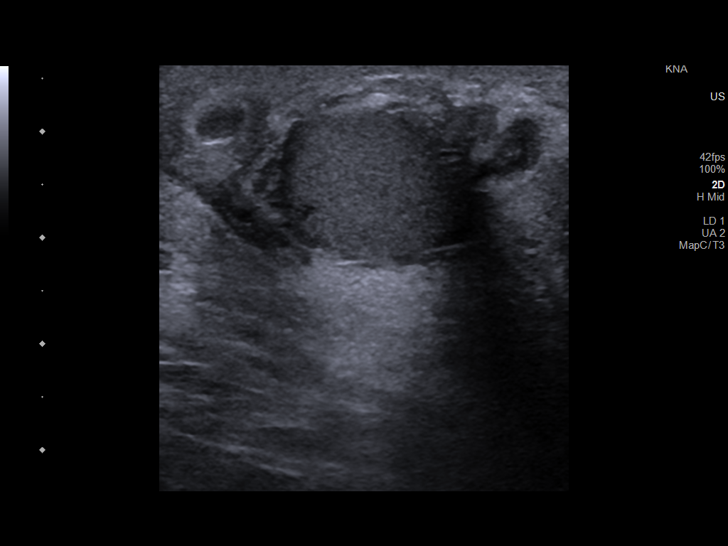
[im 59/88]
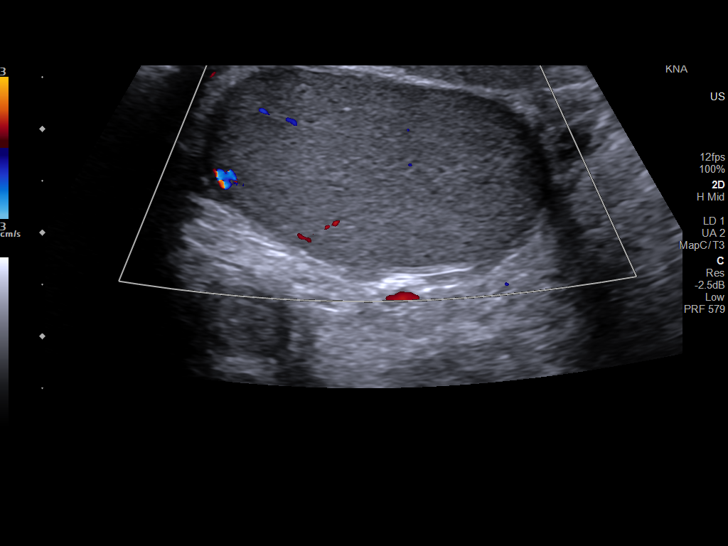
[im 66/88]
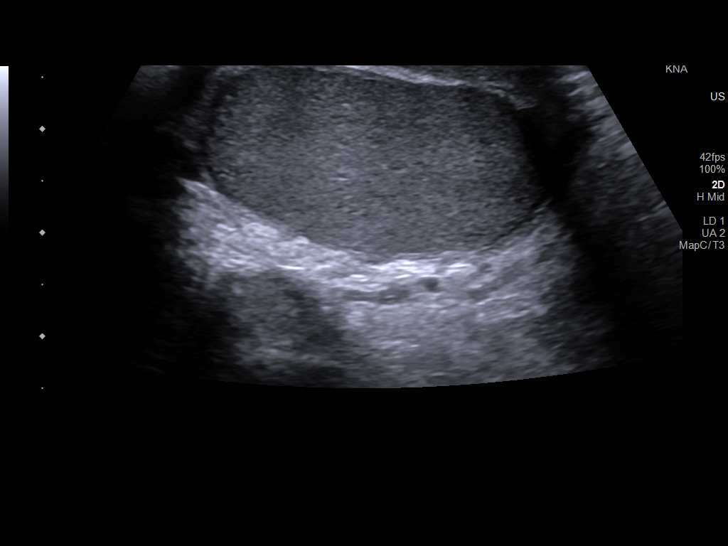
[im 73/88]
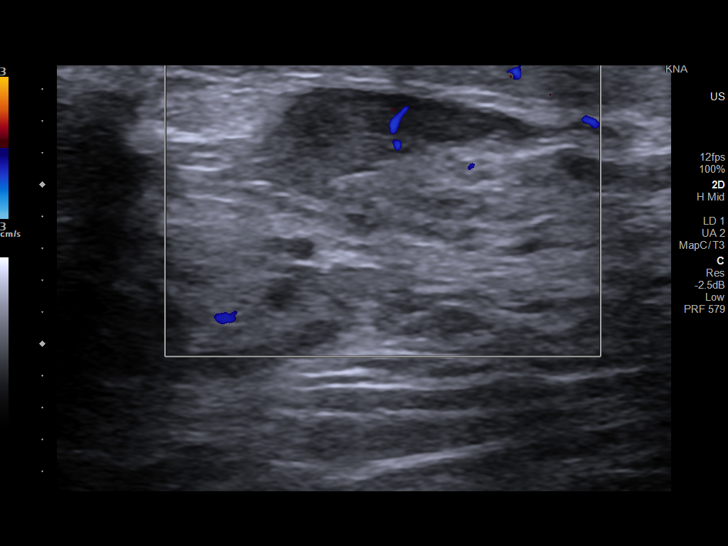
[im 80/88]
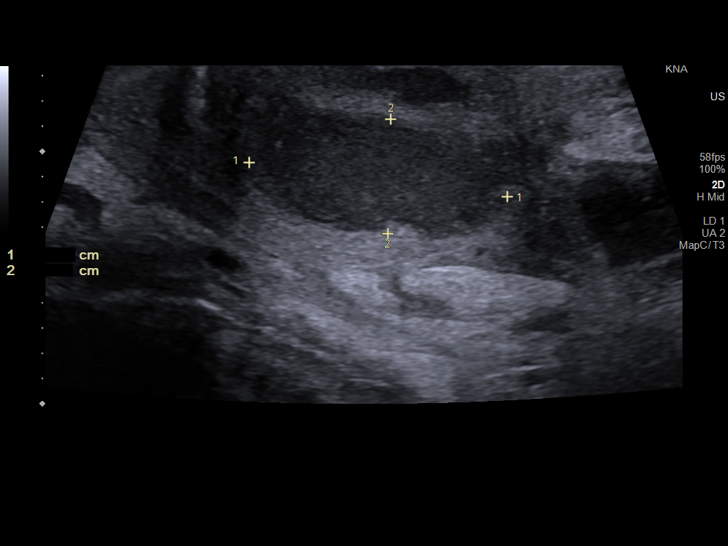
[im 88/88]
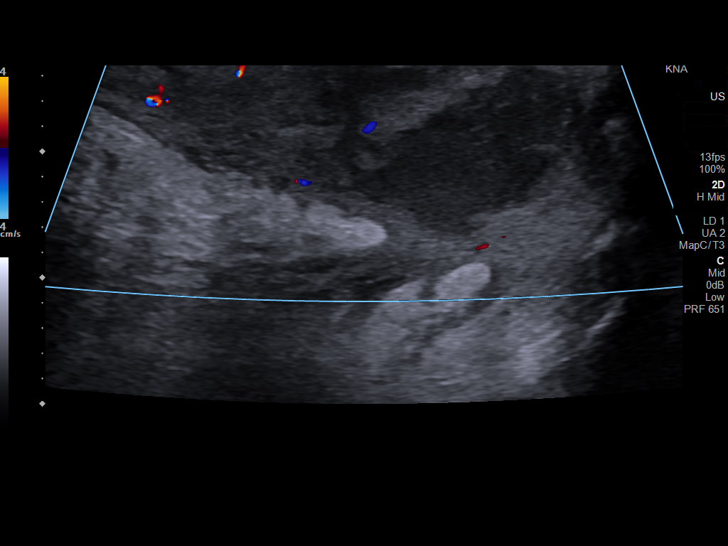

[14 of 25 positions shown; findings below may reference images not displayed]

FINDINGS: Right testicle

Measurements: 4.2 x 1.9 x 3.2 cm. No mass or microlithiasis
visualized.

Left testicle

Measurements: 3.4 x 1.8 x 3.4 cm. No mass. Scattered
microcalcifications.

Right epididymis:  4 mm epididymal head cyst.  Otherwise normal.

Left epididymis:  Normal in size and appearance.

Hydrocele:  Small right hydrocele

Varicocele:  None visualized.

Other: Complex fluid collection seen within the posterolateral left
scrotum. Area measures approximately 5.8 x 3.8 x 2.5 cm.
IMPRESSION: No testicular abnormality or evidence of torsion.

Complex fluid collection within the left posterior and lateral
scrotal wall measuring up to 5.8 cm which may reflect abscess.

## 2022-09-16 DIAGNOSIS — I358 Other nonrheumatic aortic valve disorders: Secondary | ICD-10-CM | POA: Insufficient documentation

## 2022-09-27 NOTE — Progress Notes (Deleted)
[name] [name], male    DOB: Oct 28, 1970   MRN: [id number]   Brief patient profile:  35 yowm active smoker poor ex tol entire life  referred to pulmonary clinic 06/11/2022 by [name] [name]  for copd eval      History of Present Illness  06/11/2022  Pulmonary/ 1st office eval/[name] [name]  Chief Complaint  Patient presents with   Consult    Ref for COPD.  SOB with exertion x several years.  Dyspnea:  2 aisles slow pace at [employer] = [facility] = can't walk 100 yards even at a slow pace at a flat grade s stopping due to sob   Cough: mostly dry/ worse at hs  Sleep: on back bed is flat/ 4 pillows  SABA use: not helping much  Rec Plan A = Automatic = Always=    Symbicort (same device as breztri) 80 Take 2 puffs first thing in am and then another 2 puffs about 12 hours later(optional)   Work on inhaler technique: Plan B = Backup (to supplement plan A, not to replace it) Only use your albuterol inhaler as a rescue medication Also  Ok to try albuterol 15 min before an activity (on alternating days)  that you know would usually make you short of breath  The key is to stop smoking completely before smoking completely stops you!     Please schedule a follow up visit in 3 months but call sooner if needed IN [place] WITH PFTS same day- bring inhalers    09/28/2022  f/u ov/[name] [name] re: ***   maint on *** did *** birng inhalers  No chief complaint on file.   Dyspnea:  *** Cough: *** Sleeping: *** SABA use: *** 02: *** Covid status:   *** Lung cancer screening :  ***    No obvious day to day or daytime variability or assoc excess/ purulent sputum or mucus plugs or hemoptysis or cp or chest tightness, subjective wheeze or overt sinus or hb symptoms.   *** without nocturnal  or early am exacerbation  of respiratory  c/o's or need for noct saba. Also denies any obvious fluctuation of symptoms with weather or environmental changes or other aggravating or alleviating factors except as outlined above   No  unusual exposure hx or h/o childhood pna/ asthma or knowledge of premature birth.  Current Allergies, Complete Past Medical History, Past Surgical History, Family History, and Social History were reviewed in [employer] record.  ROS  The following are not active complaints unless bolded Hoarseness, sore throat, dysphagia, dental problems, itching, sneezing,  nasal congestion or discharge of excess mucus or purulent secretions, ear ache,   fever, chills, sweats, unintended wt loss or wt gain, classically pleuritic or exertional cp,  orthopnea pnd or arm/hand swelling  or leg swelling, presyncope, palpitations, abdominal pain, anorexia, nausea, vomiting, diarrhea  or change in bowel habits or change in bladder habits, change in stools or change in urine, dysuria, hematuria,  rash, arthralgias, visual complaints, headache, numbness, weakness or ataxia or problems with walking or coordination,  change in mood or  memory.        No outpatient medications have been marked as taking for the 09/28/22 encounter (Appointment) with Nyoka Cowden, MD.            Past Medical History:  Diagnosis Date   Anxiety    Arthritis    Asthma    CAD (coronary artery disease)    Moderate LAD disease 2016 -  Dr. 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 No pulmonary embolism. 2. Lungs are clear. 3. Diffuse thickening of the distal third of the esophagus, which can be seen in the setting of GERD.    Assessment

## 2022-09-28 ENCOUNTER — Ambulatory Visit: Payer: Medicaid Other | Admitting: Internal Medicine

## 2022-10-15 ENCOUNTER — Encounter: Payer: Self-pay | Admitting: Internal Medicine

## 2022-10-27 NOTE — Progress Notes (Signed)
Cardiology Office Note:    Date:  11/08/2022   ID:  [name] [name], DOB 04-25-1971, MRN [id number]  PCP:  Lindaann Pascal, PA-C  [facility] Providers Cardiologist:  Nona Dell, MD     Referring MD: Lindaann Pascal, PA-C   Chief Complaint:  Fatigue     History of Present Illness:   [name] [name] is a 52 y.o. male with  a history of nonobstructive CAD, hypertension, hyperlipidemia, diabetes, family history of premature CAD, tobacco abuse, and orthostasis.  He previously underwent diagnostic catheterization October 2016 showing mild diffuse, nonobstructive CAD.  In August 2022, he was seen in the office with complaints of sharp shooting chest pain.  He was placed on metoprolol and a Lexiscan Myoview was performed showing a small area of mild intensity apical inferior defect with partial reversibility.  EF was 57%.  Study was overall felt to be low risk.  He was medically managed and placed on Imdur, which resulted in orthostasis and was subsequently discontinued.  Due to ongoing orthostasis at follow-up appointments, beta-blocker was also discontinued, and he was placed on midodrine.       Patient last seen by Mr. Brion Aliment, NP 08/04/22 with orthostatic dizziness 2-3 times/day and chronic DOE and some exertional chest heaviness.  Midodrine increased to 10 mg tid and hydration and salt liberalization recommended. He didn't want further ischemic testing.   Discharged 08/30/22 with acute metabolic encephalopathy due to severe hypoglycemia. Echo EF 55-60% grade 1DD  Patient comes in for f/u. Complains of weakness and no energy. Says he's staying hydrated- drinks 4 large bottles of water. He can't exercise much because he gets lightheaded and has falls. A1C 13 07/2022 but he says it's now doing better. Smoking 1/2 ppd or more.  Has a lot of problems with panic attacks.      Past Medical History:  Diagnosis Date   Anxiety    Arthritis    Asthma    CAD (coronary artery disease)    a. 03/2015  Cath: LM nl, LAD mild diff dzs throughout w/ 50p/d, LCX diff dzs, 75m, RCA diff dzs, 35m-->Med rx; b. 12/2020 MV: EF 57%, small/mild apical inferior defect w/ partial rev/mild ischemia.   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   albuterol (VENTOLIN HFA) 108 (90 Base) MCG/ACT inhaler Inhale 2 puffs into the lungs every 6 (six) hours as needed for wheezing or shortness of breath.   ARIPiprazole (ABILIFY) 10 MG tablet Take 10 mg by mouth daily.   aspirin EC 81 MG tablet Take 1 tablet by mouth daily with breakfast.   atorvastatin (LIPITOR) 80 MG tablet TAKE 1 Tablet BY MOUTH ONCE EVERY DAY (Patient taking differently: Take 80 mg by mouth daily.)   budesonide-formoterol (SYMBICORT) 80-4.5 MCG/ACT inhaler Take 2 puffs first thing in am and then another 2 puffs about 12 hours later. (Patient taking differently:  Inhale 2 puffs into the lungs 2 (two) times daily as needed (wheeze, shortness of breath).)   Continuous Blood Gluc Receiver (DEXCOM G7 RECEIVER) DEVI Use to monitor BG continuously   Continuous Blood Gluc Sensor (DEXCOM G7 SENSOR) MISC Change sensor every 10 days   famotidine (PEPCID) 40 MG tablet Take 1 tablet (40 mg total) by mouth 2 (two) times daily. (Patient taking differently: Take 40 mg by mouth daily.)   gabapentin (NEURONTIN) 300 MG capsule Take 1 capsule (300 mg total) by mouth every 8 (eight) hours as needed. (Patient taking differently: Take 300 mg by mouth every 8 (eight)  hours as needed (neuropathy).)   insulin aspart protamine - aspart (NOVOLOG MIX 70/30 FLEXPEN) (70-30) 100 UNIT/ML FlexPen Take 10 u BID with meal and slwoly go up to home dose as your oral intake picks up- follow up with your Primary Care doctor.   linaclotide (LINZESS) 145 MCG CAPS capsule Take 1 capsule (145 mcg total) by mouth daily before breakfast.   metFORMIN (GLUCOPHAGE) 500 MG tablet TAKE 1 Tablet  BY MOUTH TWICE DAILY WITH A MEAL (Patient taking differently: Take 500 mg by mouth 2 (two) times daily with a meal.)   midodrine (PROAMATINE) 10 MG tablet Take 1 tablet (10 mg total) by mouth 3 (three) times daily.   nitroGLYCERIN (NITROSTAT) 0.4 MG SL tablet Place 1 tablet (0.4 mg total) under the tongue every 5 (five) minutes as needed for chest pain.   polyethylene glycol (MIRALAX / GLYCOLAX) 17 g packet Take 17 g by mouth daily. (Patient taking differently: Take 17 g by mouth daily as needed for moderate constipation.)   prochlorperazine (COMPAZINE) 10 MG tablet Take 1 tablet (10 mg total) by mouth every 6 (six) hours as needed for nausea or vomiting.   promethazine (PHENERGAN) 25 MG tablet Take 25 mg by mouth daily as needed for nausea or vomiting.   sertraline (ZOLOFT) 100 MG tablet Take 2 tablets (200 mg total) by mouth at bedtime.   traZODone (DESYREL) 50 MG tablet Take 50 mg by mouth at bedtime.    Allergies:   Prilosec otc [omeprazole magnesium], Bee venom, Nexium [esomeprazole], and Morphine and codeine   Social History   Tobacco Use   Smoking status: Every Day    Packs/day: 1.00    Years: 34.00    Additional pack years: 0.00    Total pack years: 34.00    Types: Cigarettes    Passive exposure: Never   Smokeless tobacco: Never   Tobacco comments:    1/2 pack a day  Vaping Use   Vaping Use: Never used  Substance Use Topics   Alcohol use: Never    Alcohol/week: 0.0 standard drinks of alcohol   Drug use: Never    Family Hx: The patient's family history includes Cancer in  his mother; Congestive Heart Failure in his sister; Diabetes in his father and mother; Hyperlipidemia in his father; Hypertension in his father and mother; Throat cancer in his mother. There is no history of Colon cancer, Pancreatic cancer, or Liver disease.  ROS     Physical Exam:    VS:  BP 104/68   Pulse 100   Ht 5\' 8"  (1.727 m)   Wt 148 lb (67.1 kg)   SpO2 98%   BMI 22.50 kg/m     Wt Readings from Last 3 Encounters:  11/08/22 148 lb (67.1 kg)  08/28/22 161 lb 2.5 oz (73.1 kg)  08/04/22 146 lb (66.2 kg)    Physical Exam  ZOX:WRUE, in no acute distress  Neck: no JVD, carotid bruits, or masses Cardiac:RRR; no murmurs, rubs, or gallops  Respiratory:  decreased breath sounds some wheezing.  GI: soft, nontender, nondistended, + BS Ext: without cyanosis, clubbing, or edema, Good distal pulses bilaterally Neuro:  Alert and Oriented x 3,   Psych: euthymic mood, full affect        EKGs/Labs/Other Test Reviewed:    EKG:  EKG is not ordered today.     Recent Labs: 12/01/2021: B Natriuretic Peptide 158.0 08/27/2022: ALT 12 08/28/2022: TSH 1.378 08/29/2022: BUN 8; Creatinine, Ser 0.97; Hemoglobin 13.6; Magnesium 2.0; Platelets 226; Potassium 4.4; Sodium 137   Recent Lipid Panel No results for input(s): "CHOL", "TRIG", "HDL", "VLDL", "LDLCALC", "LDLDIRECT" in the last 8760 hours.   Prior CV Studies: ECHO COMPLETE WO IMAGING ENHANCING AGENT 08/28/2022  Narrative ECHOCARDIOGRAM REPORT    Patient Name:   DALLAN LATZ Date of Exam: 08/28/2022 Medical Rec #:  [id number]       Height:       68.0 in Accession #:    [id number]      Weight:       161.2 lb Date of Birth:  03-Apr-1971       BSA:          1.865 m Patient Age:    52 years        BP:           154/90 mmHg Patient Gender: M               HR:           73 bpm. Exam Location:  Inpatient  Procedure: 2D Echo, Cardiac Doppler and Color Doppler  Indications:     Syncope R55  History:         Patient has no prior history  of Echocardiogram examinations. CAD, COPD; Risk Factors:Current Smoker, Hypertension, Diabetes and Dyslipidemia.  Sonographer:     Dondra Prader RVT RCS Referring Phys:  [id number] Aliene Beams Diagnosing Phys: Laurance Flatten MD  IMPRESSIONS   1. Left ventricular ejection fraction, by estimation, is 55 to 60%. The left ventricle has normal function. The left ventricle has no regional wall motion abnormalities. There is mild concentric left ventricular hypertrophy. Left ventricular diastolic parameters are consistent with Grade I diastolic dysfunction (impaired relaxation). 2. Right ventricular systolic function is normal. The right ventricular size is normal. Tricuspid regurgitation signal is inadequate for assessing PA pressure. 3. The mitral valve leaflets appear mildly thickened for age. . The mitral valve is abnormal. No evidence of mitral valve regurgitation. No evidence of mitral stenosis. 4. The aortic valve is tricuspid. Aortic valve regurgitation is not visualized. Aortic valve sclerosis is present, with no evidence of aortic valve stenosis. 5. The inferior vena cava is dilated in size with <50% respiratory variability, suggesting right atrial pressure of 15 mmHg.  Comparison(s): No prior Echocardiogram.  FINDINGS Left Ventricle: Left ventricular ejection fraction, by estimation, is 55 to 60%. The left ventricle has normal function. The left ventricle has no regional wall motion abnormalities. The left ventricular internal cavity size was normal in size. There is mild concentric left ventricular hypertrophy. Left ventricular diastolic parameters are consistent with Grade I diastolic dysfunction (impaired relaxation).  Right Ventricle: The right ventricular size is normal. No increase in right ventricular wall thickness. Right ventricular systolic function is normal. Tricuspid regurgitation signal is inadequate for assessing PA pressure.  Left Atrium: Left atrial size was normal in  size.  Right Atrium: Right atrial size was normal in size.  Pericardium: There is no evidence of pericardial effusion.  Mitral Valve: The mitral valve leaflets appear mildly thickened for age. The mitral valve is abnormal. There is mild thickening of the mitral valve leaflet(s). No evidence of mitral valve regurgitation. No evidence of mitral valve stenosis.  Tricuspid Valve: The tricuspid valve is normal in structure. Tricuspid valve regurgitation is trivial.  Aortic Valve: The aortic valve is tricuspid. Aortic valve regurgitation is not visualized. Aortic valve sclerosis is present, with no evidence of aortic valve stenosis. Aortic valve mean gradient measures 3.0 mmHg. Aortic valve peak gradient measures 5.4 mmHg. Aortic valve area, by VTI measures 2.99 cm.  Pulmonic Valve: The pulmonic valve was normal in structure. Pulmonic valve regurgitation is trivial.  Aorta: The aortic root and ascending aorta are structurally normal, with no evidence of dilitation.  Venous: The inferior vena cava is dilated in size with less than 50% respiratory variability, suggesting right atrial pressure of 15 mmHg.  IAS/Shunts: The atrial septum is grossly normal.   LEFT VENTRICLE PLAX 2D LVIDd:         4.10 cm   Diastology LVIDs:         2.80 cm   LV e' medial:    6.69 cm/s LV PW:         1.60 cm   LV E/e' medial:  12.1 LV IVS:        0.90 cm   LV e' lateral:   8.11 cm/s LVOT diam:     2.20 cm   LV E/e' lateral: 10.0 LV SV:         65 LV SV Index:   35 LVOT Area:     3.80 cm   RIGHT VENTRICLE            IVC RV Basal diam:  3.00 cm    IVC diam: 2.20 cm RV Mid diam:    2.40 cm RV S prime:     7.38 cm/s TAPSE (M-mode): 1.6 cm  LEFT ATRIUM             Index        RIGHT ATRIUM           Index LA diam:        3.80 cm 2.04 cm/m   RA Area:     13.30 cm LA Vol (A2C):   33.5 ml 17.97 ml/m  RA Volume:   33.10 ml  17.75 ml/m LA Vol (A4C):   36.3 ml 19.47 ml/m LA Biplane Vol: 35.4 ml 18.99  ml/m AORTIC VALVE                    PULMONIC VALVE AV Area (Vmax):    2.92 cm     PV Vmax:       0.82 m/s AV Area (Vmean):   2.86 cm     PV Peak grad:  2.7 mmHg AV Area (VTI):     2.99 cm AV Vmax:           116.00 cm/s AV Vmean:          76.700 cm/s AV VTI:            0.219 m AV Peak Grad:      5.4 mmHg AV Mean Grad:      3.0 mmHg LVOT Vmax:         89.10 cm/s LVOT Vmean:  57.800 cm/s LVOT VTI:          0.172 m LVOT/AV VTI ratio: 0.79  AORTA Ao Root diam: 3.00 cm Ao Asc diam:  3.20 cm Ao Arch diam: 2.8 cm  MITRAL VALVE MV Area (PHT): 3.93 cm    SHUNTS MV Decel Time: 193 msec    Systemic VTI:  0.17 m MV E velocity: 80.90 cm/s  Systemic Diam: 2.20 cm MV A velocity: 89.10 cm/s MV E/A ratio:  0.91  Laurance Flatten MD Electronically signed by Laurance Flatten MD Signature Date/Time: 08/28/2022/11:55:48 AM    Final (Updated)        Risk Assessment/Calculations/Metrics:              ASSESSMENT & PLAN:   No problem-specific Assessment & Plan notes found for this encounter.    Precordial chest pain/nonobstructive CAD: Patient with a long history of intermittent rest and exertional angina, particularly worse with anxiety and emotional upset.  Nonobstructive disease on catheterization 2016 with low risk stress test in 2022.  No recent symptoms with chest pain.  He remains on aspirin, statin.  Beta-blocker and nitrate previously discontinued in the setting of orthostasis.    Orthostatic hypotension: Patient doing better on midodrine 10 mg 3 times daily.  Encouraged adequate hydration and salt liberalization.   Hyperlipidemia: Complicated by intermittent noncompliance with statin therapy.  LDL was 181 in April 2023.  He says he is currently taking atorvastatin 80 mg daily.  Check FLP    Tobacco abuse: Continues to smoke 1/2-1 full pack a day.  Not currently contemplating quitting.  Complete cessation advised.                 Dispo:  No follow-ups on file.    Medication Adjustments/Labs and Tests Ordered: Current medicines are reviewed at length with the patient today.  Concerns regarding medicines are outlined above.  Tests Ordered: Orders Placed This Encounter  Procedures   Lipid panel   Medication Changes: No orders of the defined types were placed in this encounter.  Elson Clan, PA-C  11/08/2022 11:29 AM    [facility] [address] [place], [place], [place]  [address] Phone: [phone]; Fax: [phone]

## 2022-11-08 ENCOUNTER — Ambulatory Visit: Payer: Medicaid Other | Attending: Physician Assistant | Admitting: Physician Assistant

## 2022-11-08 ENCOUNTER — Encounter: Payer: Self-pay | Admitting: Physician Assistant

## 2022-11-08 VITALS — BP 104/68 | HR 100 | Ht 68.0 in | Wt 148.0 lb

## 2022-11-08 DIAGNOSIS — Z87898 Personal history of other specified conditions: Secondary | ICD-10-CM | POA: Insufficient documentation

## 2022-11-08 DIAGNOSIS — Z720 Tobacco use: Secondary | ICD-10-CM | POA: Insufficient documentation

## 2022-11-08 DIAGNOSIS — E782 Mixed hyperlipidemia: Secondary | ICD-10-CM | POA: Diagnosis present

## 2022-11-08 DIAGNOSIS — I951 Orthostatic hypotension: Secondary | ICD-10-CM | POA: Insufficient documentation

## 2022-11-08 NOTE — Patient Instructions (Addendum)
Medication Instructions:  Your physician recommends that you continue on your current medications as directed. Please refer to the Current Medication list given to you today.  *If you need a refill on your cardiac medications before your next appointment, please call your pharmacy*   Lab Work: Your physician recommends that you return for lab work in: Fasting ( Lipid)    If you have labs (blood work) drawn today and your tests are completely normal, you will receive your results only by: MyChart Message (if you have MyChart) OR A paper copy in the mail If you have any lab test that is abnormal or we need to change your treatment, we will call you to review the results.   Testing/Procedures: NONE     Follow-Up: At [facility], you and your health needs are our priority.  As part of our continuing mission to provide you with exceptional heart care, we have created designated Provider Care Teams.  These Care Teams include your primary Cardiologist (physician) and Advanced Practice Providers (APPs -  Physician Assistants and Nurse Practitioners) who all work together to provide you with the care you need, when you need it.  We recommend signing up for the patient portal called "MyChart".  Sign up information is provided on this After Visit Summary.  MyChart is used to connect with patients for Virtual Visits (Telemedicine).  Patients are able to view lab/test results, encounter notes, upcoming appointments, etc.  Non-urgent messages can be sent to your provider as well.   To learn more about what you can do with MyChart, go to [email or web address].    Your next appointment:   6 month(s)  Provider:   Nona Dell, MD    Other Instructions Thank you for choosing [facility]!  Managing the Challenge of Quitting Smoking Quitting smoking is a physical and mental challenge. You may have cravings, withdrawal symptoms, and temptation to smoke. Before quitting, work  with your health care provider to make a plan that can help you manage quitting. Making a plan before you quit may keep you from smoking when you have the urge to smoke while trying to quit. How to manage lifestyle changes Managing stress Stress can make you want to smoke, and wanting to smoke may cause stress. It is important to find ways to manage your stress. You could try some of the following: Practice relaxation techniques. Breathe slowly and deeply, in through your nose and out through your mouth. Listen to music. Soak in a bath or take a shower. Imagine a peaceful place or vacation. Get some support. Talk with family or friends about your stress. Join a support group. Talk with a counselor or therapist. Get some physical activity. Go for a walk, run, or bike ride. Play a favorite sport. Practice yoga.  Medicines Talk with your health care provider about medicines that might help you deal with cravings and make quitting easier for you. Relationships Social situations can be difficult when you are quitting smoking. To manage this, you can: Avoid parties and other social situations where people might be smoking. Avoid alcohol. Leave right away if you have the urge to smoke. Explain to your family and friends that you are quitting smoking. Ask for support and let them know you might be a bit grumpy. Plan activities where smoking is not an option. General instructions Be aware that many people gain weight after they quit smoking. However, not everyone does. To keep from gaining weight, have a plan in place  before you quit, and stick to the plan after you quit. Your plan should include: Eating healthy snacks. When you have a craving, it may help to: Eat popcorn, or try carrots, celery, or other cut vegetables. Chew sugar-free gum. Changing how you eat. Eat small portion sizes at meals. Eat 4-6 small meals throughout the day instead of 1-2 large meals a day. Be mindful when you  eat. You should avoid watching television or doing other things that might distract you as you eat. Exercising regularly. Make time to exercise each day. If you do not have time for a long workout, do short bouts of exercise for 5-10 minutes several times a day. Do some form of strengthening exercise, such as weight lifting. Do some exercise that gets your heart beating and causes you to breathe deeply, such as walking fast, running, swimming, or biking. This is very important. Drinking plenty of water or other low-calorie or no-calorie drinks. Drink enough fluid to keep your urine pale yellow.  How to recognize withdrawal symptoms Your body and mind may experience discomfort as you try to get used to not having nicotine in your system. These effects are called withdrawal symptoms. They may include: Feeling hungrier than normal. Having trouble concentrating. Feeling irritable or restless. Having trouble sleeping. Feeling depressed. Craving a cigarette. These symptoms may surprise you, but they are normal to have when quitting smoking. To manage withdrawal symptoms: Avoid places, people, and activities that trigger your cravings. Remember why you want to quit. Get plenty of sleep. Avoid coffee and other drinks that contain caffeine. These may worsen some of your symptoms. How to manage cravings Come up with a plan for how to deal with your cravings. The plan should include the following: A definition of the specific situation you want to deal with. An activity or action you will take to replace smoking. A clear idea for how this action will help. The name of someone who could help you with this. Cravings usually last for 5-10 minutes. Consider taking the following actions to help you with your plan to deal with cravings: Keep your mouth busy. Chew sugar-free gum. Suck on hard candies or a straw. Brush your teeth. Keep your hands and body busy. Change to a different activity right  away. Squeeze or play with a ball. Do an activity or a hobby, such as making bead jewelry, practicing needlepoint, or working with wood. Mix up your normal routine. Take a short exercise break. Go for a quick walk, or run up and down stairs. Focus on doing something kind or helpful for someone else. Call a friend or family member to talk during a craving. Join a support group. Contact a quitline. Where to find support To get help or find a support group: Call the National Cancer Institute's Smoking Quitline: 1-800-QUIT-NOW (820)828-9850) Text QUIT to SmokefreeTXT: 295621 Where to find more information Visit these websites to find more information on quitting smoking: U.S. Department of Health and Human Services: www.smokefree.gov American Lung Association: www.freedomfromsmoking.org Centers for Disease Control and Prevention (CDC): FootballExhibition.com.br American Heart Association: www.heart.org Contact a health care provider if: You want to change your plan for quitting. The medicines you are taking are not helping. Your eating feels out of control or you cannot sleep. You feel depressed or become very anxious. Summary Quitting smoking is a physical and mental challenge. You will face cravings, withdrawal symptoms, and temptation to smoke again. Preparation can help you as you go through these challenges. Try different techniques  to manage stress, handle social situations, and prevent weight gain. You can deal with cravings by keeping your mouth busy (such as by chewing gum), keeping your hands and body busy, calling family or friends, or contacting a quitline for people who want to quit smoking. You can deal with withdrawal symptoms by avoiding places where people smoke, getting plenty of rest, and avoiding drinks that contain caffeine. This information is not intended to replace advice given to you by your health care provider. Make sure you discuss any questions you have with your health care  provider. Document Revised: 05/08/2021 Document Reviewed: 05/08/2021 Elsevier Patient Education  2024 [employer].

## 2022-11-09 ENCOUNTER — Other Ambulatory Visit: Payer: Self-pay

## 2022-11-09 ENCOUNTER — Emergency Department (HOSPITAL_COMMUNITY)
Admission: EM | Admit: 2022-11-09 | Discharge: 2022-11-09 | Disposition: A | Discharge status: Home / Self Care | Payer: Medicaid Other | Attending: Emergency Medicine | Admitting: Emergency Medicine

## 2022-11-09 ENCOUNTER — Other Ambulatory Visit (HOSPITAL_COMMUNITY)
Admission: RE | Admit: 2022-11-09 | Discharge: 2022-11-09 | Disposition: A | Discharge status: Home / Self Care | Payer: Medicaid Other | Source: Ambulatory Visit | Attending: Physician Assistant | Admitting: Physician Assistant

## 2022-11-09 ENCOUNTER — Encounter (HOSPITAL_COMMUNITY): Payer: Self-pay

## 2022-11-09 DIAGNOSIS — Z7982 Long term (current) use of aspirin: Secondary | ICD-10-CM | POA: Insufficient documentation

## 2022-11-09 DIAGNOSIS — E782 Mixed hyperlipidemia: Secondary | ICD-10-CM | POA: Insufficient documentation

## 2022-11-09 DIAGNOSIS — J449 Chronic obstructive pulmonary disease, unspecified: Secondary | ICD-10-CM | POA: Insufficient documentation

## 2022-11-09 DIAGNOSIS — Z7984 Long term (current) use of oral hypoglycemic drugs: Secondary | ICD-10-CM | POA: Insufficient documentation

## 2022-11-09 DIAGNOSIS — E11649 Type 2 diabetes mellitus with hypoglycemia without coma: Secondary | ICD-10-CM | POA: Diagnosis present

## 2022-11-09 DIAGNOSIS — E162 Hypoglycemia, unspecified: Secondary | ICD-10-CM

## 2022-11-09 LAB — LIPID PANEL
Cholesterol: 173 mg/dL (ref 0–200)
HDL: 34 mg/dL — ABNORMAL LOW (ref >40)
LDL Cholesterol: 117 mg/dL — ABNORMAL HIGH (ref 0–99)
Total CHOL/HDL Ratio: 5.1 RATIO
Triglycerides: 110 mg/dL (ref <150)
VLDL: 22 mg/dL (ref 0–40)

## 2022-11-09 LAB — CBG MONITORING, ED: Glucose-Capillary: 170 mg/dL — ABNORMAL HIGH (ref 70–99)

## 2022-11-09 NOTE — ED Triage Notes (Signed)
Patient from home for reports of low blood sugar. Reports it was 58 at home; reports he ate some candy to try to bring it up. Upon arrival to ER, patient is alert and oriented, ambu; reports HA. BS on arrival 170

## 2022-11-09 NOTE — Discharge Instructions (Signed)
Continue to monitor your sugars and follow-up with your primary doctor.

## 2022-11-09 NOTE — ED Provider Notes (Signed)
[place] EMERGENCY DEPARTMENT AT [facility] Provider Note   CSN: [id number] Arrival date & time: 11/09/22  0037     History  Chief Complaint  Patient presents with   Hypoglycemia    [name] [name] is a 52 y.o. male.  Patient is a 52 year old male with past medical history of diabetes, hyperlipidemia, COPD.  Patient presenting today with complaints of low blood sugar.  He was at his brother's house this evening when he began to feel tingly, then checked his blood sugar and got a reading of 58.  He reports eating several small candy bars along with an orange soda, then sugar began to improve.  He is now feeling better.  He denies any change in his insulin dose or dietary change.  The history is provided by the patient.       Home Medications Prior to Admission medications   Medication Sig Start Date End Date Taking? Authorizing Provider  acetaminophen (TYLENOL) 500 MG tablet Take 1,000 mg by mouth 2 (two) times daily as needed for moderate pain, fever or headache.    [provider]  albuterol (VENTOLIN HFA) 108 (90 Base) MCG/ACT inhaler Inhale 2 puffs into the lungs every 6 (six) hours as needed for wheezing or shortness of breath.    [provider]  ALPRAZolam Prudy Feeler) 0.5 MG tablet Take 0.25-0.5 mg by mouth See admin instructions. 0.5 mg at bedtime. 0.25-0.5 mg twice daily as needed for anxiety. Patient not taking: Reported on 11/08/2022    [provider]  ARIPiprazole (ABILIFY) 10 MG tablet Take 10 mg by mouth daily.    [provider]  aspirin EC 81 MG tablet Take 1 tablet by mouth daily with breakfast. 02/14/21   [provider]  atorvastatin (LIPITOR) 80 MG tablet TAKE 1 Tablet BY MOUTH ONCE EVERY DAY Patient taking differently: Take 80 mg by mouth daily. 04/07/22   [name], PA-C  budesonide-formoterol (SYMBICORT) 80-4.5 MCG/ACT inhaler Take 2 puffs first thing in am and then another 2 puffs about 12 hours  later. Patient taking differently: Inhale 2 puffs into the lungs 2 (two) times daily as needed (wheeze, shortness of breath). 06/11/22   Nyoka Cowden, MD  Continuous Blood Gluc Receiver (DEXCOM G7 RECEIVER) DEVI Use to monitor BG continuously 02/02/22   Roma Kayser, MD  Continuous Blood Gluc Sensor (DEXCOM G7 SENSOR) MISC Change sensor every 10 days 02/02/22   Roma Kayser, MD  famotidine (PEPCID) 40 MG tablet Take 1 tablet (40 mg total) by mouth 2 (two) times daily. Patient taking differently: Take 40 mg by mouth daily. 05/26/21   [name], PA-C  gabapentin (NEURONTIN) 300 MG capsule Take 1 capsule (300 mg total) by mouth every 8 (eight) hours as needed. Patient taking differently: Take 300 mg by mouth every 8 (eight) hours as needed (neuropathy). 10/22/21 01/22/23  Terald Sleeper, MD  insulin aspart protamine - aspart (NOVOLOG MIX 70/30 FLEXPEN) (70-30) 100 UNIT/ML FlexPen Take 10 u BID with meal and slwoly go up to home dose as your oral intake picks up- follow up with your Primary Care doctor. 08/30/22   Lanae Boast, MD  linaclotide Karlene Einstein) 145 MCG CAPS capsule Take 1 capsule (145 mcg total) by mouth daily before breakfast. 06/21/22 06/16/23  Cirigliano, Vito V, DO  metFORMIN (GLUCOPHAGE) 500 MG tablet TAKE 1 Tablet  BY MOUTH TWICE DAILY WITH A MEAL Patient taking differently: Take 500 mg by mouth 2 (two) times daily with a meal.  04/07/22   Roma Kayser, MD  midodrine (PROAMATINE) 10 MG tablet Take 1 tablet (10 mg total) by mouth 3 (three) times daily. 08/04/22   Creig Hines, NP  nitroGLYCERIN (NITROSTAT) 0.4 MG SL tablet Place 1 tablet (0.4 mg total) under the tongue every 5 (five) minutes as needed for chest pain. 10/05/17   [name], PA-C  polyethylene glycol (MIRALAX / GLYCOLAX) 17 g packet Take 17 g by mouth daily. Patient taking differently: Take 17 g by mouth daily as needed for moderate constipation. 11/11/20   Gilda Crease, MD   prochlorperazine (COMPAZINE) 10 MG tablet Take 1 tablet (10 mg total) by mouth every 6 (six) hours as needed for nausea or vomiting. 07/07/22   Dione Booze, MD  promethazine (PHENERGAN) 25 MG tablet Take 25 mg by mouth daily as needed for nausea or vomiting.    [provider]  sertraline (ZOLOFT) 100 MG tablet Take 2 tablets (200 mg total) by mouth at bedtime. 08/30/22   Lanae Boast, MD  traZODone (DESYREL) 50 MG tablet Take 50 mg by mouth at bedtime. 10/03/20   [provider]  Vonoprazan Fumarate 20 MG TABS Take 20 mg by mouth daily. Take 20 mg by mouth daily. For 8 weeks, then take 10 mg daily. Patient not taking: Reported on 08/30/2022 06/21/22   Cirigliano, Verlin Dike, DO      Allergies    Prilosec otc [omeprazole magnesium], Bee venom, Nexium [esomeprazole], and Morphine and codeine    Review of Systems   Review of Systems  All other systems reviewed and are negative.   Physical Exam Updated Vital Signs BP (!) 143/81 (BP Location: Left Arm)   Pulse 98   Temp 97.8 F (36.6 C) (Oral)   Resp 18   Ht 5\' 8"  (1.727 m)   Wt 67.1 kg   SpO2 100%   BMI 22.50 kg/m  Physical Exam Vitals and nursing note reviewed.  Constitutional:      General: He is not in acute distress.    Appearance: He is well-developed. He is not diaphoretic.  HENT:     Head: Normocephalic and atraumatic.  Cardiovascular:     Rate and Rhythm: Normal rate and regular rhythm.     Heart sounds: No murmur heard.    No friction rub.  Pulmonary:     Effort: Pulmonary effort is normal. No respiratory distress.     Breath sounds: Normal breath sounds. No wheezing or rales.  Abdominal:     General: Bowel sounds are normal. There is no distension.     Palpations: Abdomen is soft.     Tenderness: There is no abdominal tenderness.  Musculoskeletal:        General: Normal range of motion.     Cervical back: Normal range of motion and neck supple.  Skin:    General: Skin is warm and dry.  Neurological:      Mental Status: He is alert and oriented to person, place, and time.     Coordination: Coordination normal.     ED Results / Procedures / Treatments   Labs (all labs ordered are listed, but only abnormal results are displayed) Labs Reviewed  CBG MONITORING, ED - Abnormal; Notable for the following components:      Result Value   Glucose-Capillary 170 (*)    All other components within normal limits    EKG None  Radiology No results found.  Procedures Procedures    Medications Ordered in ED Medications -  No data to display  ED Course/ Medical Decision Making/ A&P  Patient presenting after experiencing an episode of low blood sugar at his brother's house.  He reports a reading of 58, but has been over 200 since he has arrived here.  He did eat candy bars and orange soda prior to coming here.  At this point, it appears as though his blood sugars are maintaining and that he can safely be discharged.  Final Clinical Impression(s) / ED Diagnoses Final diagnoses:  None    Rx / DC Orders ED Discharge Orders     None         Geoffery Lyons, MD 11/09/22 [phone]

## 2022-11-11 ENCOUNTER — Telehealth: Payer: Self-pay | Admitting: Physician Assistant

## 2022-11-11 DIAGNOSIS — Z79899 Other long term (current) drug therapy: Secondary | ICD-10-CM

## 2022-11-11 DIAGNOSIS — E782 Mixed hyperlipidemia: Secondary | ICD-10-CM

## 2022-11-11 MED ORDER — EZETIMIBE 10 MG PO TABS: 10.0000 mg | ORAL_TABLET | Freq: Every day | ORAL | 3 refills | Status: DC

## 2022-11-11 NOTE — Telephone Encounter (Signed)
Dyann Kief, PA-C 11/09/2022  2:03 PM EDT     LDL down to 117 but still elevated. Please add zetia 10 mg once daily and repeat FLP in 3 months no other changes thanks    Patient Communication     Results discussed with patient, zetia 10 mg qd added,lab ordered placed,due mid September,copied pcp

## 2022-11-11 NOTE — Telephone Encounter (Signed)
Follow Up:     Patient is returning a call from 11-09-22, concerning his lab resul.ts.

## 2022-12-07 ENCOUNTER — Encounter: Payer: Self-pay | Admitting: Gastroenterology

## 2022-12-09 ENCOUNTER — Encounter: Payer: Self-pay | Admitting: *Deleted

## 2023-01-17 NOTE — Progress Notes (Unsigned)
[facility] [address] [place], [place] [address]   CLINIC:  Medical Oncology/Hematology  PCP:  Lindaann Pascal, PA-C [address] [place] [address] [phone]   REASON FOR VISIT:  Follow-up for leukocytosis  PRIOR THERAPY: None  CURRENT THERAPY: Surveillance  INTERVAL HISTORY:   [name] [name] 52 y.o. male returns for routine follow-up of leukocytosis.  He was last seen by Rojelio Brenner PA-C/19/24.  Since his last visit, he was hospitalized from 08/27/2022 through 08/30/2022 for acute metabolic encephalopathy related to hypoglycemia.  He required intubation due to acute hypoxic respiratory failure secondary to metabolic encephalopathy.  He was also found to have superficial venous thrombosis of left upper extremity during that hospitalization.  At today's visit, he reports feeling somewhat poorly due to his multiple chronic health conditions.***  ***He continues to deny any recent infections. *** No use of systemic steroids, but uses Symbicort (steroid containing) inhaler daily.***  No B symptoms reported.  ***He denies any new lumps or bumps. *** He continues to smoke 0.5 PPD.  He has little to no*** energy and 60***% appetite. He endorses that he is maintaining a stable weight.   ASSESSMENT & PLAN:  1.  Neutrophilic leukocytosis - Patient seen at the request of Scott Long, PA-C - Intermittent leukocytosis (neutrophil predominant) since 2017 - No personal history of connective tissue disorders. - Current everyday smoker (0.5 PPD)*** - Uses a Symbicort inhaler daily.  *** - No B symptoms or recurrent infections.*** - CTAP (11/29/2021): Spleen appears normal. - Hematology workup (06/18/2022):  Elevated ESR (20).  Normal LDH, CRP. Negative JAK2 with reflex to CALR, MPL, Exon 12-15 Negative BCR/ABL FISH - Most recent CBC (01/18/2023): *** - No lymphadenopathy or palpable splenomegaly on exam *** - PLAN: MPN workup negative, no evidence of malignant  leukocytosis at this time.*** - Differential diagnosis favors reactive leukocytosis in the setting of tobacco use, steroid containing inhaler, and chronic pro inflammatory state.*** - We will check labs (CBC/D) with office visit in 6 months, followed by annual visit versus discharge to PCP.  ***  2.  Other history - Other PMH includes coronary artery disease, COPD, type 2 diabetes mellitus, sleep apnea, hypertension, hyperlipidemia, arthritis, anxiety/depression - He lives at home with his father.  Trying to get disability.  He previously worked at [employer] at [employer].  He is current active smoker, half pack per day for 38 years. - Mother had breast cancer and mantle cell lymphoma. - Maternal grandmother had breast cancer.  Maternal aunt had breast cancer.  PLAN SUMMARY:*** Not reviewed *** >> Labs in 6 months (CBC/D only) + same-day OFFICE visit     REVIEW OF SYSTEMS: ***  Review of Systems  Constitutional:  Positive for fatigue. Negative for appetite change, chills, diaphoresis, fever and unexpected weight change.  HENT:   Positive for trouble swallowing (difficulty chewing due to teeth). Negative for lump/mass and nosebleeds.   Eyes:  Negative for eye problems.  Respiratory:  Positive for cough and shortness of breath. Negative for hemoptysis.   Cardiovascular:  Negative for chest pain, leg swelling and palpitations.  Gastrointestinal:  Positive for diarrhea, nausea and vomiting. Negative for abdominal pain, blood in stool and constipation.  Genitourinary:  Positive for frequency. Negative for hematuria.   Skin: Negative.   Neurological:  Positive for headaches and numbness. Negative for dizziness and light-headedness.  Hematological:  Does not bruise/bleed easily.  Psychiatric/Behavioral:  Positive for depression and sleep disturbance. The patient is nervous/anxious.  PHYSICAL EXAM:***  ECOG PERFORMANCE STATUS: 2 - Symptomatic, <50% confined to bed  There were no  vitals filed for this visit. There were no vitals filed for this visit. Physical Exam Constitutional:      Appearance: Normal appearance.  HENT:     Head: Normocephalic and atraumatic.     Mouth/Throat:     Mouth: Mucous membranes are moist.  Eyes:     Extraocular Movements: Extraocular movements intact.     Pupils: Pupils are equal, round, and reactive to light.  Cardiovascular:     Rate and Rhythm: Normal rate and regular rhythm.     Pulses: Normal pulses.     Heart sounds: Normal heart sounds.  Pulmonary:     Effort: Pulmonary effort is normal.     Breath sounds: Normal breath sounds.  Abdominal:     General: Bowel sounds are normal.     Palpations: Abdomen is soft.     Tenderness: There is no abdominal tenderness.  Musculoskeletal:        General: No swelling.     Right lower leg: No edema.     Left lower leg: No edema.  Lymphadenopathy:     Cervical: No cervical adenopathy.  Skin:    General: Skin is warm and dry.  Neurological:     General: No focal deficit present.     Mental Status: He is alert and oriented to person, place, and time.     Gait: Gait abnormal (gait instability noted).  Psychiatric:        Mood and Affect: Mood normal.        Behavior: Behavior normal.     PAST MEDICAL/SURGICAL HISTORY:  Past Medical History:  Diagnosis Date   Anxiety    Arthritis    Asthma    CAD (coronary artery disease)    a. 03/2015 Cath: LM nl, LAD mild diff dzs throughout w/ 50p/d, LCX diff dzs, 68m, RCA diff dzs, 33m-->Med rx; b. 12/2020 MV: EF 57%, small/mild apical inferior defect w/ partial rev/mild ischemia.   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, bilateral    Sleep apnea 2016   Type 2  diabetes mellitus (HCC)    Past Surgical History:  Procedure Laterality Date   ANKLE SURGERY Right 1982   "had extra bones in there; took them out"   APPENDECTOMY  1975   BIOPSY  12/26/2018   Procedure: BIOPSY;  Surgeon: Malissa Hippo, MD;  Location: [facility];  Service: Endoscopy;;  duodenal biopsies   BIOPSY  03/23/2019   Procedure: BIOPSY;  Surgeon: Malissa Hippo, MD;  Location: [facility];  Service: Endoscopy;;  esophagus   CARDIAC CATHETERIZATION N/A 03/28/2015   Procedure: Left Heart Cath and Coronary Angiography;  Surgeon: Yates Decamp, MD;  Location: [facility] INVASIVE CV LAB;  Service: Cardiovascular;  Laterality: N/A;   CARDIAC CATHETERIZATION N/A 03/28/2015   Procedure: Intravascular Pressure Wire/FFR Study;  Surgeon: Yates Decamp, MD;  Location: [facility] INVASIVE CV LAB;  Service: Cardiovascular;  Laterality: N/A;   CARPAL TUNNEL RELEASE Left ~ 2008   COLONOSCOPY WITH ESOPHAGOGASTRODUODENOSCOPY (EGD)     COLONOSCOPY WITH PROPOFOL N/A 12/24/2019  Procedure: COLONOSCOPY WITH PROPOFOL;  Surgeon: Malissa Hippo, MD;  Location: [facility];  Service: Endoscopy;  Laterality: N/A;  955   ELBOW FRACTURE SURGERY Left ~ 2008   ESOPHAGEAL MANOMETRY N/A 09/16/2021   Procedure: ESOPHAGEAL MANOMETRY (EM);  Surgeon: Shellia Cleverly, DO;  Location: [facility];  Service: Endoscopy;  Laterality: N/A;   ESOPHAGOGASTRODUODENOSCOPY N/A 12/26/2018   Procedure: ESOPHAGOGASTRODUODENOSCOPY (EGD);  Surgeon: Malissa Hippo, MD;  Location: [facility];  Service: Endoscopy;  Laterality: N/A;   ESOPHAGOGASTRODUODENOSCOPY (EGD) WITH PROPOFOL N/A 03/23/2019   Procedure: ESOPHAGOGASTRODUODENOSCOPY (EGD) WITH PROPOFOL;  Surgeon: Malissa Hippo, MD;  Location: [facility];  Service: Endoscopy;  Laterality: N/A;  7:30   FRACTURE SURGERY     ankle and elbow   PH IMPEDANCE STUDY N/A 09/16/2021   Procedure: PH IMPEDANCE STUDY;  Surgeon: Shellia Cleverly, DO;  Location: [facility];  Service: Endoscopy;   Laterality: N/A;   PILONIDAL CYST EXCISION N/A 03/24/2017   Procedure: EXCISION CHRONIC  PILONIDAL ABSCESS;  Surgeon: [name] Miyamoto, MD;  Location: [facility];  Service: General;  Laterality: N/A;   SCROTAL EXPLORATION N/A 11/13/2020   Procedure: EXCISION OF SEBACEOUS CYSTS, SCROTUM;  Surgeon: Malen Gauze, MD;  Location: [facility];  Service: Urology;  Laterality: N/A;   TENDON REPAIR Left ~ 2004   "main tendon in my ankle"    SOCIAL HISTORY:  Social History   Socioeconomic History   Marital status: Single    Spouse name: Not on file   Number of children: 0   Years of education: 10th g   Highest education level: Not on file  Occupational History   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   Social Determinants of Health   Financial Resource Strain: High Risk (06/24/2022)   Overall Financial Resource Strain (CARDIA)    Difficulty of Paying Living Expenses: Very hard  Food Insecurity: Food Insecurity Present (06/18/2022)   Hunger Vital Sign    Worried About Running Out of Food in the Last Year: Often true    Ran Out of Food in the Last Year: Often true  Transportation Needs: No Transportation Needs (06/18/2022)   PRAPARE - Administrator, Civil Service (Medical): No    Lack of Transportation (Non-Medical): No  Physical Activity: Not on file  Stress: Not on file  Social Connections: Unknown (05/25/2022)    [facility]   Social Network    Social Network: Not on file  Intimate Partner Violence: Not At Risk (06/18/2022)   Humiliation, Afraid, Rape, and Kick questionnaire    Fear of Current or Ex-Partner: No    Emotionally Abused: No    Physically Abused: No    Sexually Abused: No    FAMILY HISTORY:  Family History  Problem Relation Age of Onset  Diabetes Mother    Hypertension Mother    Cancer Mother    Throat cancer Mother    Diabetes Father    Hypertension Father    Hyperlipidemia Father    Congestive Heart Failure Sister    Colon cancer Neg Hx    Pancreatic cancer Neg Hx    Liver disease Neg Hx     CURRENT MEDICATIONS:  Outpatient Encounter Medications as of 01/18/2023  Medication Sig   acetaminophen (TYLENOL) 500 MG tablet Take 1,000 mg by mouth 2 (two) times daily as needed for moderate pain, fever or headache.   albuterol (VENTOLIN HFA) 108 (90 Base) MCG/ACT inhaler Inhale 2 puffs into the lungs every 6 (six) hours as needed for wheezing or shortness of breath.   ALPRAZolam (XANAX) 0.5 MG tablet Take 0.25-0.5 mg by mouth See admin instructions. 0.5 mg at bedtime. 0.25-0.5 mg twice daily as needed for anxiety. (Patient not taking: Reported on 11/08/2022)   ARIPiprazole (ABILIFY) 10 MG tablet Take 10 mg by mouth daily.   aspirin EC 81 MG tablet Take 1 tablet by mouth daily with breakfast.   atorvastatin (LIPITOR) 80 MG tablet TAKE 1 Tablet BY MOUTH ONCE EVERY DAY (Patient taking differently: Take 80 mg by mouth daily.)   budesonide-formoterol (SYMBICORT) 80-4.5 MCG/ACT inhaler Take 2 puffs first thing in am and then another 2 puffs about 12 hours later. (Patient taking differently: Inhale 2 puffs into the lungs 2 (two) times daily as needed (wheeze, shortness of breath).)   Continuous Blood Gluc Receiver (DEXCOM G7 RECEIVER) DEVI Use to monitor BG continuously   Continuous Blood Gluc Sensor (DEXCOM G7 SENSOR) MISC Change sensor every 10 days   ezetimibe (ZETIA) 10  MG tablet Take 1 tablet (10 mg total) by mouth daily.   famotidine (PEPCID) 40 MG tablet Take 1 tablet (40 mg total) by mouth 2 (two) times daily. (Patient taking differently: Take 40 mg by mouth daily.)   gabapentin (NEURONTIN) 300 MG capsule Take 1 capsule (300 mg total) by mouth every 8 (eight) hours as needed. (Patient taking differently: Take 300 mg by mouth every 8 (eight) hours as needed (neuropathy).)   insulin aspart protamine - aspart (NOVOLOG MIX 70/30 FLEXPEN) (70-30) 100 UNIT/ML FlexPen Take 10 u BID with meal and slwoly go up to home dose as your oral intake picks up- follow up with your Primary Care doctor.   linaclotide (LINZESS) 145 MCG CAPS capsule Take 1 capsule (145 mcg total) by mouth daily before breakfast.   metFORMIN (GLUCOPHAGE) 500 MG tablet TAKE 1 Tablet  BY MOUTH TWICE DAILY WITH A MEAL (Patient taking differently: Take 500 mg by mouth 2 (two) times daily with a meal.)   midodrine (PROAMATINE) 10 MG tablet Take 1 tablet (10 mg total) by mouth 3 (three) times daily.   nitroGLYCERIN (NITROSTAT) 0.4 MG SL tablet Place 1 tablet (0.4 mg total) under the tongue every 5 (five) minutes as needed for chest pain.   polyethylene glycol (MIRALAX / GLYCOLAX) 17 g packet Take 17 g by mouth daily. (Patient taking differently: Take 17 g by mouth daily as needed for moderate constipation.)   prochlorperazine (COMPAZINE) 10 MG tablet Take 1 tablet (10 mg total) by mouth every 6 (six) hours as needed for nausea or vomiting.   promethazine (PHENERGAN) 25 MG tablet Take 25 mg by mouth daily as needed for nausea or vomiting.   sertraline (ZOLOFT) 100 MG tablet Take 2 tablets (200 mg total) by mouth at bedtime.   traZODone (DESYREL)  50 MG tablet Take 50 mg by mouth at bedtime.   Vonoprazan Fumarate 20 MG TABS Take 20 mg by mouth daily. Take 20 mg by mouth daily. For 8 weeks, then take 10 mg daily. (Patient not taking: Reported on 08/30/2022)   No facility-administered encounter medications on file  as of 01/18/2023.    ALLERGIES:  Allergies  Allergen Reactions   Prilosec Otc [Omeprazole Magnesium] Swelling    Face swells, no breathing impairment   Bee Venom Swelling   Nexium [Esomeprazole] Swelling    Face swells, no breathing impairment   Morphine And Codeine Nausea And Vomiting and Rash    Itching at IV site, dry heaving, burning sensation in throat.    LABORATORY DATA:  I have reviewed the labs as listed.  CBC    Component Value Date/Time   WBC 11.0 (H) 08/29/2022 0014   RBC 4.30 08/29/2022 0014   HGB 13.6 08/29/2022 0014   HCT 38.8 (L) 08/29/2022 0014   PLT 226 08/29/2022 0014   MCV 90.2 08/29/2022 0014   MCH 31.6 08/29/2022 0014   MCHC 35.1 08/29/2022 0014   RDW 12.9 08/29/2022 0014   LYMPHSABS 1.5 08/27/2022 1854   MONOABS 0.5 08/27/2022 1854   EOSABS 0.0 08/27/2022 1854   BASOSABS 0.0 08/27/2022 1854      Latest Ref Rng & Units 08/29/2022   12:14 AM 08/28/2022    6:13 AM 08/27/2022    6:54 PM  CMP  Glucose 70 - 99 mg/dL 409  74  97   BUN 6 - 20 mg/dL 8  8  12    Creatinine 0.61 - 1.24 mg/dL 8.11  9.14  7.82   Sodium 135 - 145 mmol/L 137  139  134   Potassium 3.5 - 5.1 mmol/L 4.4  3.4  3.9   Chloride 98 - 111 mmol/L 109  110  103   CO2 22 - 32 mmol/L 23  19  24    Calcium 8.9 - 10.3 mg/dL 7.9  7.4  8.1   Total Protein 6.5 - 8.1 g/dL   6.3   Total Bilirubin 0.3 - 1.2 mg/dL   0.7   Alkaline Phos 38 - 126 U/L   83   AST 15 - 41 U/L   17   ALT 0 - 44 U/L   12     DIAGNOSTIC IMAGING:  I have independently reviewed the relevant imaging and discussed with the patient.   WRAP UP:  All questions were answered. The patient knows to call the clinic with any problems, questions or concerns.  Medical decision making: Low***  Time spent on visit: I spent *** minutes counseling the patient face to face. The total time spent in the appointment was *** minutes and more than 50% was on counseling.  Carnella Guadalajara, PA-C  ***

## 2023-01-18 ENCOUNTER — Inpatient Hospital Stay (HOSPITAL_BASED_OUTPATIENT_CLINIC_OR_DEPARTMENT_OTHER): Payer: MEDICAID | Admitting: Physician Assistant

## 2023-01-18 ENCOUNTER — Inpatient Hospital Stay: Payer: MEDICAID | Attending: Hematology

## 2023-01-18 ENCOUNTER — Encounter: Payer: Self-pay | Admitting: Physician Assistant

## 2023-01-18 ENCOUNTER — Ambulatory Visit: Payer: Medicaid Other | Admitting: Physician Assistant

## 2023-01-18 ENCOUNTER — Other Ambulatory Visit: Payer: Medicaid Other

## 2023-01-18 VITALS — BP 116/78 | HR 101 | Temp 98.6°F | Resp 18 | Wt 150.0 lb

## 2023-01-18 DIAGNOSIS — Z7984 Long term (current) use of oral hypoglycemic drugs: Secondary | ICD-10-CM | POA: Diagnosis not present

## 2023-01-18 DIAGNOSIS — K219 Gastro-esophageal reflux disease without esophagitis: Secondary | ICD-10-CM | POA: Insufficient documentation

## 2023-01-18 DIAGNOSIS — D72828 Other elevated white blood cell count: Secondary | ICD-10-CM | POA: Diagnosis not present

## 2023-01-18 DIAGNOSIS — E785 Hyperlipidemia, unspecified: Secondary | ICD-10-CM | POA: Diagnosis not present

## 2023-01-18 DIAGNOSIS — J45909 Unspecified asthma, uncomplicated: Secondary | ICD-10-CM | POA: Insufficient documentation

## 2023-01-18 DIAGNOSIS — G9341 Metabolic encephalopathy: Secondary | ICD-10-CM | POA: Diagnosis not present

## 2023-01-18 DIAGNOSIS — Z7951 Long term (current) use of inhaled steroids: Secondary | ICD-10-CM | POA: Insufficient documentation

## 2023-01-18 DIAGNOSIS — I2510 Atherosclerotic heart disease of native coronary artery without angina pectoris: Secondary | ICD-10-CM | POA: Insufficient documentation

## 2023-01-18 DIAGNOSIS — R700 Elevated erythrocyte sedimentation rate: Secondary | ICD-10-CM | POA: Diagnosis not present

## 2023-01-18 DIAGNOSIS — Z7982 Long term (current) use of aspirin: Secondary | ICD-10-CM | POA: Diagnosis not present

## 2023-01-18 DIAGNOSIS — I10 Essential (primary) hypertension: Secondary | ICD-10-CM | POA: Diagnosis not present

## 2023-01-18 DIAGNOSIS — E11621 Type 2 diabetes mellitus with foot ulcer: Secondary | ICD-10-CM | POA: Insufficient documentation

## 2023-01-18 DIAGNOSIS — E1142 Type 2 diabetes mellitus with diabetic polyneuropathy: Secondary | ICD-10-CM | POA: Diagnosis not present

## 2023-01-18 DIAGNOSIS — Z803 Family history of malignant neoplasm of breast: Secondary | ICD-10-CM | POA: Diagnosis not present

## 2023-01-18 DIAGNOSIS — G4730 Sleep apnea, unspecified: Secondary | ICD-10-CM | POA: Insufficient documentation

## 2023-01-18 DIAGNOSIS — D72829 Elevated white blood cell count, unspecified: Secondary | ICD-10-CM | POA: Diagnosis present

## 2023-01-18 DIAGNOSIS — Z79899 Other long term (current) drug therapy: Secondary | ICD-10-CM | POA: Diagnosis not present

## 2023-01-18 DIAGNOSIS — J4489 Other specified chronic obstructive pulmonary disease: Secondary | ICD-10-CM | POA: Diagnosis not present

## 2023-01-18 DIAGNOSIS — F17210 Nicotine dependence, cigarettes, uncomplicated: Secondary | ICD-10-CM | POA: Insufficient documentation

## 2023-01-18 LAB — CBC WITH DIFFERENTIAL/PLATELET
Abs Immature Granulocytes: 0.03 10*3/uL (ref 0.00–0.07)
Basophils Absolute: 0.1 10*3/uL (ref 0.0–0.1)
Basophils Relative: 1 %
Eosinophils Absolute: 0.3 10*3/uL (ref 0.0–0.5)
Eosinophils Relative: 2 %
HCT: 43 % (ref 39.0–52.0)
Hemoglobin: 14.2 g/dL (ref 13.0–17.0)
Immature Granulocytes: 0 %
Lymphocytes Relative: 24 %
Lymphs Abs: 2.8 10*3/uL (ref 0.7–4.0)
MCH: 31 pg (ref 26.0–34.0)
MCHC: 33 g/dL (ref 30.0–36.0)
MCV: 93.9 fL (ref 80.0–100.0)
Monocytes Absolute: 0.7 10*3/uL (ref 0.1–1.0)
Monocytes Relative: 6 %
Neutro Abs: 8 10*3/uL — ABNORMAL HIGH (ref 1.7–7.7)
Neutrophils Relative %: 67 %
Platelets: 234 10*3/uL (ref 150–400)
RBC: 4.58 MIL/uL (ref 4.22–5.81)
RDW: 13 % (ref 11.5–15.5)
WBC: 11.8 10*3/uL — ABNORMAL HIGH (ref 4.0–10.5)
nRBC: 0 % (ref 0.0–0.2)

## 2023-01-18 NOTE — Patient Instructions (Signed)
[facility] at [facility] **VISIT SUMMARY & IMPORTANT INSTRUCTIONS **   You were seen today by Rojelio Brenner PA-C for your elevated white blood cells.   Your labs did not NOT show any sign of white blood cell cancer or genetic mutation. Your white blood cells are most likely elevated due to inflammation from smoking, as well as in response to your Symbicort inhaler.  FOLLOW-UP APPOINTMENT: We will check labs and see you for an office visit in 1 year  ** Thank you for trusting me with your healthcare!  I strive to provide all of my patients with quality care at each visit.  If you receive a survey for this visit, I would be so grateful to you for taking the time to provide feedback.  Thank you in advance!  ~ Iolani Twilley                   Dr. Doreatha Massed   &   Rojelio Brenner, PA-C   - - - - - - - - - - - - - - - - - -    Thank you for choosing [facility] at [facility] to provide your oncology and hematology care.  To afford each patient quality time with our provider, please arrive at least 15 minutes before your scheduled appointment time.   If you have a lab appointment with the Cancer Center please come in thru the Main Entrance and check in at the main information desk.  You need to re-schedule your appointment should you arrive 10 or more minutes late.  We strive to give you quality time with our providers, and arriving late affects you and other patients whose appointments are after yours.  Also, if you no show three or more times for appointments you may be dismissed from the clinic at the providers discretion.     Again, thank you for choosing [facility].  Our hope is that these requests will decrease the amount of time that you wait before being seen by our physicians.       _____________________________________________________________  Should you have questions after your visit to [facility],  please contact our office at [phone] and follow the prompts.  Our office hours are 8:00 a.m. and 4:30 p.m. Monday - Friday.  Please note that voicemails left after 4:00 p.m. may not be returned until the following business day.  We are closed weekends and major holidays.  You do have access to a nurse 24-7, just call the main number to the clinic [phone] and do not press any options, hold on the line and a nurse will answer the phone.    For prescription refill requests, have your pharmacy contact our office and allow 72 hours.

## 2023-02-08 ENCOUNTER — Emergency Department (HOSPITAL_COMMUNITY)
Admission: EM | Admit: 2023-02-08 | Discharge: 2023-02-08 | Disposition: A | Discharge status: Home / Self Care | Payer: MEDICAID | Attending: Emergency Medicine | Admitting: Emergency Medicine

## 2023-02-08 ENCOUNTER — Emergency Department (HOSPITAL_COMMUNITY): Payer: MEDICAID

## 2023-02-08 ENCOUNTER — Ambulatory Visit (HOSPITAL_COMMUNITY)
Admission: RE | Admit: 2023-02-08 | Discharge: 2023-02-08 | Disposition: A | Discharge status: Home / Self Care | Payer: MEDICAID | Source: Ambulatory Visit | Attending: Internal Medicine | Admitting: Internal Medicine

## 2023-02-08 ENCOUNTER — Other Ambulatory Visit: Payer: Self-pay

## 2023-02-08 DIAGNOSIS — J449 Chronic obstructive pulmonary disease, unspecified: Secondary | ICD-10-CM | POA: Insufficient documentation

## 2023-02-08 DIAGNOSIS — I10 Essential (primary) hypertension: Secondary | ICD-10-CM | POA: Insufficient documentation

## 2023-02-08 DIAGNOSIS — Z794 Long term (current) use of insulin: Secondary | ICD-10-CM | POA: Insufficient documentation

## 2023-02-08 DIAGNOSIS — E860 Dehydration: Secondary | ICD-10-CM | POA: Insufficient documentation

## 2023-02-08 DIAGNOSIS — Z7982 Long term (current) use of aspirin: Secondary | ICD-10-CM | POA: Insufficient documentation

## 2023-02-08 DIAGNOSIS — R519 Headache, unspecified: Secondary | ICD-10-CM | POA: Insufficient documentation

## 2023-02-08 DIAGNOSIS — R42 Dizziness and giddiness: Secondary | ICD-10-CM

## 2023-02-08 DIAGNOSIS — I2510 Atherosclerotic heart disease of native coronary artery without angina pectoris: Secondary | ICD-10-CM | POA: Insufficient documentation

## 2023-02-08 LAB — BASIC METABOLIC PANEL
Anion gap: 9 (ref 5–15)
BUN: 29 mg/dL — ABNORMAL HIGH (ref 6–20)
CO2: 24 mmol/L (ref 22–32)
Calcium: 8.3 mg/dL — ABNORMAL LOW (ref 8.9–10.3)
Chloride: 104 mmol/L (ref 98–111)
Creatinine, Ser: 1.51 mg/dL — ABNORMAL HIGH (ref 0.61–1.24)
GFR, Estimated: 55 mL/min — ABNORMAL LOW (ref >60)
Glucose, Bld: 146 mg/dL — ABNORMAL HIGH (ref 70–99)
Potassium: 4.8 mmol/L (ref 3.5–5.1)
Sodium: 137 mmol/L (ref 135–145)

## 2023-02-08 LAB — PULMONARY FUNCTION TEST
DL/VA % pred: 64 %
DL/VA: 2.82 ml/min/mmHg/L
DLCO unc % pred: 63 %
DLCO unc: 17.13 ml/min/mmHg
FEF 25-75 Post: 1.39 L/s
FEF 25-75 Pre: 2.37 L/s
FEF2575-%Change-Post: -41 %
FEF2575-%Pred-Post: 44 %
FEF2575-%Pred-Pre: 75 %
FEV1-%Change-Post: -22 %
FEV1-%Pred-Post: 65 %
FEV1-%Pred-Pre: 85 %
FEV1-Post: 2.36 L
FEV1-Pre: 3.07 L
FEV1FVC-%Change-Post: -9 %
FEV1FVC-%Pred-Pre: 90 %
FEV6-%Change-Post: -13 %
FEV6-%Pred-Post: 84 %
FEV6-%Pred-Pre: 97 %
FEV6-Post: 3.75 L
FEV6-Pre: 4.33 L
FEV6FVC-%Change-Post: 1 %
FEV6FVC-%Pred-Post: 103 %
FEV6FVC-%Pred-Pre: 102 %
FVC-%Change-Post: -14 %
FVC-%Pred-Post: 81 %
FVC-%Pred-Pre: 95 %
FVC-Post: 3.77 L
FVC-Pre: 4.41 L
Post FEV1/FVC ratio: 63 %
Post FEV6/FVC ratio: 99 %
Pre FEV1/FVC ratio: 69 %
Pre FEV6/FVC Ratio: 98 %

## 2023-02-08 LAB — CBC
HCT: 44.1 % (ref 39.0–52.0)
Hemoglobin: 14.6 g/dL (ref 13.0–17.0)
MCH: 31 pg (ref 26.0–34.0)
MCHC: 33.1 g/dL (ref 30.0–36.0)
MCV: 93.6 fL (ref 80.0–100.0)
Platelets: 262 10*3/uL (ref 150–400)
RBC: 4.71 MIL/uL (ref 4.22–5.81)
RDW: 12.4 % (ref 11.5–15.5)
WBC: 12.1 10*3/uL — ABNORMAL HIGH (ref 4.0–10.5)
nRBC: 0 % (ref 0.0–0.2)

## 2023-02-08 LAB — MAGNESIUM: Magnesium: 1.8 mg/dL (ref 1.7–2.4)

## 2023-02-08 MED ORDER — SODIUM CHLORIDE 0.9 % IV BOLUS
1000.0000 mL | Freq: Once | INTRAVENOUS | Status: AC
  Administered 2023-02-08: 1000 mL via INTRAVENOUS

## 2023-02-08 MED ORDER — ALBUTEROL SULFATE (2.5 MG/3ML) 0.083% IN NEBU
2.5000 mg | INHALATION_SOLUTION | Freq: Once | RESPIRATORY_TRACT | Status: AC
  Administered 2023-02-08: 2.5 mg via RESPIRATORY_TRACT

## 2023-02-08 NOTE — ED Notes (Signed)
Pt refused CBG stick due to he had his own monitor.  His monitor says "142"

## 2023-02-08 NOTE — ED Triage Notes (Signed)
Pt in hospital taking a pulmonary exam and was unable to finish exam due to dizziness. Respiratory therapist brought pt to ED for eval after neb tx was not helpful.Pt is on RA. No signs of distress at this time.

## 2023-02-08 NOTE — ED Provider Notes (Signed)
[place] EMERGENCY DEPARTMENT AT [facility] Provider Note   CSN: [id number] Arrival date & time: 02/08/23  1243     History  Chief Complaint  Patient presents with   Dizziness    [name] [name] is a 52 y.o. male.   Dizziness Associated symptoms: headaches   Associated symptoms: no chest pain, no nausea, no shortness of breath, no vomiting and no weakness        [name] [name] is a 52 y.o. male past medical history of hypertension, anxiety, coronary artery disease, chronic bronchitis and COPD who presents to the Emergency Department for complaint of dizziness.  Sudden onset, again while doing a pulmonary function exam.  States that he developed dizziness, posterior headache and felt faint during the exam.  He was given a breathing treatment without improvement.  Describes throbbing sensation to the base of his skull.  States he feels dizzy, mostly with position change.  Denies any vertiginous type symptoms.  No nausea vomiting, syncope, chest pain or feeling short of breath.     Home Medications Prior to Admission medications   Medication Sig Start Date End Date Taking? Authorizing Provider  acetaminophen (TYLENOL) 500 MG tablet Take 1,000 mg by mouth 2 (two) times daily as needed for moderate pain, fever or headache.    [provider]  albuterol (VENTOLIN HFA) 108 (90 Base) MCG/ACT inhaler Inhale 2 puffs into the lungs every 6 (six) hours as needed for wheezing or shortness of breath.    [provider]  ARIPiprazole (ABILIFY) 10 MG tablet Take 10 mg by mouth daily.    [provider]  aspirin EC 81 MG tablet Take 1 tablet by mouth daily with breakfast. 02/14/21   [provider]  atorvastatin (LIPITOR) 80 MG tablet TAKE 1 Tablet BY MOUTH ONCE EVERY DAY Patient taking differently: Take 80 mg by mouth daily. 04/07/22   [name], PA-C  budesonide-formoterol (SYMBICORT) 80-4.5 MCG/ACT inhaler Take 2 puffs first thing in am  and then another 2 puffs about 12 hours later. Patient taking differently: Inhale 2 puffs into the lungs 2 (two) times daily as needed (wheeze, shortness of breath). 06/11/22   Nyoka Cowden, MD  Continuous Blood Gluc Receiver (DEXCOM G7 RECEIVER) DEVI Use to monitor BG continuously 02/02/22   Roma Kayser, MD  Continuous Blood Gluc Sensor (DEXCOM G7 SENSOR) MISC Change sensor every 10 days 02/02/22   Roma Kayser, MD  ezetimibe (ZETIA) 10 MG tablet Take 1 tablet (10 mg total) by mouth daily. 11/11/22 02/09/23  Dyann Kief, PA-C  famotidine (PEPCID) 40 MG tablet Take 1 tablet (40 mg total) by mouth 2 (two) times daily. Patient taking differently: Take 40 mg by mouth daily. 05/26/21   [name], PA-C  gabapentin (NEURONTIN) 300 MG capsule Take 1 capsule (300 mg total) by mouth every 8 (eight) hours as needed. Patient taking differently: Take 300 mg by mouth every 8 (eight) hours as needed (neuropathy). 10/22/21 01/22/23  Terald Sleeper, MD  insulin aspart protamine - aspart (NOVOLOG MIX 70/30 FLEXPEN) (70-30) 100 UNIT/ML FlexPen Take 10 u BID with meal and slwoly go up to home dose as your oral intake picks up- follow up with your Primary Care doctor. 08/30/22   Lanae Boast, MD  linaclotide Karlene Einstein) 145 MCG CAPS capsule Take 1 capsule (145 mcg total) by mouth daily before breakfast. 06/21/22 06/16/23  Cirigliano, Vito V, DO  metFORMIN (GLUCOPHAGE) 500 MG tablet TAKE 1 Tablet  BY MOUTH  TWICE DAILY WITH A MEAL Patient taking differently: Take 500 mg by mouth 2 (two) times daily with a meal. 04/07/22   Nida, Denman George, MD  midodrine (PROAMATINE) 10 MG tablet Take 1 tablet (10 mg total) by mouth 3 (three) times daily. 08/04/22   Creig Hines, NP  nitroGLYCERIN (NITROSTAT) 0.4 MG SL tablet Place 1 tablet (0.4 mg total) under the tongue every 5 (five) minutes as needed for chest pain. 10/05/17   [name], PA-C  polyethylene glycol (MIRALAX / GLYCOLAX) 17 g packet  Take 17 g by mouth daily. Patient taking differently: Take 17 g by mouth daily as needed for moderate constipation. 11/11/20   Gilda Crease, MD  prochlorperazine (COMPAZINE) 10 MG tablet Take 1 tablet (10 mg total) by mouth every 6 (six) hours as needed for nausea or vomiting. 07/07/22   Dione Booze, MD  promethazine (PHENERGAN) 25 MG tablet Take 25 mg by mouth daily as needed for nausea or vomiting.    [provider]  sertraline (ZOLOFT) 100 MG tablet Take 2 tablets (200 mg total) by mouth at bedtime. 08/30/22   Lanae Boast, MD  traZODone (DESYREL) 50 MG tablet Take 50 mg by mouth at bedtime. 10/03/20   [provider]      Allergies    Prilosec otc [omeprazole magnesium], Bee venom, Nexium [esomeprazole], and Morphine and codeine    Review of Systems   Review of Systems  Constitutional:  Positive for fever. Negative for appetite change and chills.  Respiratory:  Negative for shortness of breath.   Cardiovascular:  Negative for chest pain.  Gastrointestinal:  Negative for abdominal pain, nausea and vomiting.  Genitourinary:  Negative for dysuria.  Neurological:  Positive for dizziness, light-headedness and headaches. Negative for seizures, syncope, speech difficulty and weakness.    Physical Exam Updated Vital Signs BP 116/79   Pulse 100   Temp 98.5 F (36.9 C)   Resp (!) 21   Ht 5\' 8"  (1.727 m)   Wt 65.8 kg   SpO2 100%   BMI 22.05 kg/m  Physical Exam Vitals and nursing note reviewed.  Constitutional:      General: He is not in acute distress.    Appearance: Normal appearance. He is not ill-appearing or toxic-appearing.  HENT:     Mouth/Throat:     Mouth: Mucous membranes are moist.  Eyes:     Extraocular Movements: Extraocular movements intact.     Conjunctiva/sclera: Conjunctivae normal.     Pupils: Pupils are equal, round, and reactive to light.  Cardiovascular:     Rate and Rhythm: Normal rate and regular rhythm.     Pulses: Normal pulses.   Pulmonary:     Effort: Pulmonary effort is normal. No respiratory distress.     Breath sounds: No wheezing or rales.  Abdominal:     Palpations: Abdomen is soft.     Tenderness: There is no abdominal tenderness.  Musculoskeletal:        General: Normal range of motion.     Cervical back: Normal range of motion.     Right lower leg: No edema.     Left lower leg: No edema.  Skin:    General: Skin is warm.     Capillary Refill: Capillary refill takes less than 2 seconds.  Neurological:     General: No focal deficit present.     Mental Status: He is alert.     GCS: GCS eye subscore is 4. GCS verbal subscore is  5. GCS motor subscore is 6.     Sensory: Sensation is intact. No sensory deficit.     Motor: Motor function is intact. No weakness.     Comments: CN II through XII intact.  Speech clear.  No pronator drift, no facial droop     ED Results / Procedures / Treatments   Labs (all labs ordered are listed, but only abnormal results are displayed) Labs Reviewed  BASIC METABOLIC PANEL - Abnormal; Notable for the following components:      Result Value   Glucose, Bld 146 (*)    BUN 29 (*)    Creatinine, Ser 1.51 (*)    Calcium 8.3 (*)    GFR, Estimated 55 (*)    All other components within normal limits  CBC - Abnormal; Notable for the following components:   WBC 12.1 (*)    All other components within normal limits  MAGNESIUM  URINALYSIS, ROUTINE W REFLEX MICROSCOPIC  CBG MONITORING, ED    EKG EKG Interpretation Date/Time:  Tuesday February 08 2023 14:37:09 EDT Ventricular Rate:  96 PR Interval:  128 QRS Duration:  126 QT Interval:  372 QTC Calculation: 469 R Axis:   -58  Text Interpretation: Normal sinus rhythm Right bundle branch block Left anterior fascicular block Confirmed by Gloris Manchester ([id number]) on 02/08/2023 3:51:01 PM  Radiology CT Head Wo Contrast  Result Date: 02/08/2023 CLINICAL DATA:  Headache dizziness EXAM: CT HEAD WITHOUT CONTRAST TECHNIQUE:  Contiguous axial images were obtained from the base of the skull through the vertex without intravenous contrast. RADIATION DOSE REDUCTION: This exam was performed according to the departmental dose-optimization program which includes automated exposure control, adjustment of the mA and/or kV according to patient size and/or use of iterative reconstruction technique. COMPARISON:  CT brain 08/27/2022 FINDINGS: Brain: No acute territorial infarction, hemorrhage or intracranial mass. Mild atrophy. Nonenlarged ventricles Vascular: No hyperdense vessels.  Carotid vascular calcification Skull: Normal. Negative for fracture or focal lesion. Sinuses/Orbits: No acute finding. Other: None IMPRESSION: No CT evidence for acute intracranial abnormality. Mild atrophy. Electronically Signed   By: Jasmine Pang M.D.   On: 02/08/2023 15:16    Procedures Procedures    Medications Ordered in ED Medications  sodium chloride 0.9 % bolus 1,000 mL (0 mLs Intravenous Stopped 02/08/23 1700)  sodium chloride 0.9 % bolus 1,000 mL (0 mLs Intravenous Stopped 02/08/23 1836)    ED Course/ Medical Decision Making/ A&P                                 Medical Decision Making Patient here from another department of the hospital was having pulmonary function testing and he suddenly became dizzy and was brought to the emergency department for further evaluation.  Describes sensation of movement, headache pain to the back of his head.  Denies any symptoms prior to the start of his pulmonary testing.  I do not appreciate any focal neurodeficits on his exam  Symptoms possibly related to change in blood sugar, hypoxia, electrolyte imbalance TIA  Amount and/or Complexity of Data Reviewed Labs: ordered.    Details: Labs interpreted by me no significant leukocytosis, hemoglobin unremarkable, magnesium reassuring.  Chemistries show blood sugar of 146, creatinine elevated at 1.51.  BUN also elevated at 29.  Creatinine 1.51 today,  0.97 five  month ago Radiology: ordered.    Details: CT head without acute intracranial finding ECG/medicine tests: ordered.    Details: EKG  shows normal sinus rhythm, right bundle branch block, left anterior fascicular block.  No previous EKG available Discussion of management or test interpretation with external provider(s): Patient here with AKI, IV fluids given.  Orthostatic, additional IVF given.  After receiving fluids, patient reports feeling better and wants to go home.  I have offered hospital admission for his AKI, he declined stating that his symptoms have now resolved and he wants to go home.  He states he has follow-up with [facility] on the 19th of this month.  He is ambulated in the department with steady gait.  No further dizziness or headache. Strict return precautions given           Final Clinical Impression(s) / ED Diagnoses Final diagnoses:  Dizziness  Dehydration    Rx / DC Orders ED Discharge Orders     None         Rosey Bath 02/09/23 1217    Gloris Manchester, MD 02/12/23 1250

## 2023-02-08 NOTE — ED Notes (Signed)
Pt ambulated without help or any issues.

## 2023-02-08 NOTE — Discharge Instructions (Signed)
Drink plenty of water over the next few days.  Closely monitor your blood sugars.  Follow-up with your primary care provider this week for recheck.  Keep your upcoming appointment with nephrology.  Return to the emergency department for any new or worsening symptoms.

## 2023-02-18 ENCOUNTER — Telehealth: Payer: Self-pay | Admitting: Internal Medicine

## 2023-02-18 NOTE — Telephone Encounter (Signed)
Patient would like to know the result of his PFT. Please call and advise [phone]

## 2023-02-21 NOTE — Telephone Encounter (Signed)
Pt results are in the computer , pt is wants to know results please advise

## 2023-02-21 NOTE — Telephone Encounter (Signed)
Called and lvm for patient

## 2023-02-21 NOTE — Telephone Encounter (Signed)
Result note done but not sent   He has very mild copd and on appropriate rx but needs to stop all smoking to keep from getting worse and Be sure patient has/keeps f/u ov so we can go over all the details of this study and get a plan together moving forward - ok to move up f/u if not feeling better and wants to be seen sooner

## 2023-02-24 NOTE — Telephone Encounter (Signed)
Called and spoke notified of his results of PFT , pt said that he didn't have a follow up appt. Made a appt for patient to come in to discuss his results further.

## 2023-03-01 ENCOUNTER — Telehealth: Payer: Self-pay | Admitting: Neurology

## 2023-03-01 NOTE — Telephone Encounter (Signed)
Pt said you trying CPAP machine now that has medicaid. Requesting a copy of medical records to proof that I had a prescription.   Informed pt would not be able to use the prescription and we would need a referral from your physician due to you are a considered an un-established patient. Pt verbalized understand, will call physician for a referral.

## 2023-03-07 ENCOUNTER — Other Ambulatory Visit: Payer: Self-pay | Admitting: Nephrology

## 2023-03-07 DIAGNOSIS — N1832 Chronic kidney disease, stage 3b: Secondary | ICD-10-CM

## 2023-03-10 ENCOUNTER — Inpatient Hospital Stay
Admission: RE | Admit: 2023-03-10 | Discharge: 2023-03-10 | Discharge status: Home / Self Care | Payer: MEDICAID | Source: Ambulatory Visit | Attending: Nephrology | Admitting: Nephrology

## 2023-03-10 DIAGNOSIS — N1832 Chronic kidney disease, stage 3b: Secondary | ICD-10-CM

## 2023-03-13 NOTE — Progress Notes (Signed)
[name] STROHSCHEIN, male    DOB: 1970/12/11   MRN: [id number]   Brief patient profile:  31 yowm active smoker poor ex tol entire life  referred to pulmonary clinic 06/11/2022 by Lindaann Pascal  for copd eval        History of Present Illness  06/11/2022  Pulmonary/ 1st office eval/Theophil Thivierge  Chief Complaint  Patient presents with   Consult    Ref for COPD.  SOB with exertion x several years.  Dyspnea:  2 aisles slow pace at [employer] = [facility] = can't walk 100 yards even at a slow pace at a flat grade s stopping due to sob   Cough: mostly dry/ worse at hs  Sleep: on back bed is flat/ 4 pillows  SABA use: not helping much  Rec Plan A = Automatic = Always=    Symbicort (same device as breztri) 80 Take 2 puffs first thing in am and then another 2 puffs about 12 hours later(optional)   Work on inhaler technique:  Plan B = Backup (to supplement plan A, not to replace it) Only use your albuterol inhaler as a rescue medication)  Also  Ok to try albuterol 15 min before an activity (on alternating days)  that you know would usually make you short of breath  The key is to stop smoking completely before smoking completely stops you!      Please schedule a follow up visit in 3 months but call sooner if needed IN [place]  - bring inhalers   03/14/2023  f/u ov/ office/Najla Aughenbaugh re: GOLD 1/AB maint on ???   did not bring inhalers / really confused about the details of his care  Chief Complaint  Patient presents with   COPD  Dyspnea:  still able to walk at [employer] Cough: white mucus Sleeping: flat bed / 4 pillows with main concern nasal congestion and "just can't get comfortable" due to aches and pains  SABA use: does not have one  02: none   Lung cancer screening see Ct with contrast 08/27/22      No obvious day to day or daytime variability or assoc excess/ purulent sputum or mucus plugs or hemoptysis or cp or chest tightness, subjective wheeze or overt sinus or hb symptoms.    Also denies  any obvious fluctuation of symptoms with weather or environmental changes or other aggravating or alleviating factors except as outlined above   No unusual exposure hx or h/o childhood pna/ asthma or knowledge of premature birth.  Current Allergies, Complete Past Medical History, Past Surgical History, Family History, and Social History were reviewed in [employer] record.  ROS  The following are not active complaints unless bolded Hoarseness, sore throat, dysphagia, dental problems, itching, sneezing,  nasal congestion or discharge of excess mucus or purulent secretions, ear ache,   fever, chills, sweats, unintended wt loss or wt gain, classically pleuritic or exertional cp,  orthopnea pnd or arm/hand swelling  or leg swelling, presyncope, palpitations, abdominal pain, anorexia, nausea, vomiting, diarrhea  or change in bowel habits or change in bladder habits, change in stools or change in urine, dysuria, hematuria,  rash, arthralgias, visual complaints, headache, numbness, weakness or ataxia or problems with walking or coordination,  change in mood or  memory.        Current Meds  - - NOTE:   Unable to verify as accurately reflecting what pt takes    Medication Sig   acetaminophen (TYLENOL) 500 MG tablet  Take 1,000 mg by mouth 2 (two) times daily as needed for moderate pain, fever or headache.   albuterol (VENTOLIN HFA) 108 (90 Base) MCG/ACT inhaler Inhale 2 puffs into the lungs every 6 (six) hours as needed for wheezing or shortness of breath.   ALPRAZolam (XANAX) 0.5 MG tablet Take 0.5 mg by mouth at bedtime as needed.   amLODipine (NORVASC) 5 MG tablet Take 5 mg by mouth daily.   ARIPiprazole (ABILIFY) 10 MG tablet Take 10 mg by mouth daily.   aspirin EC 81 MG tablet Take 1 tablet by mouth daily with breakfast.   atorvastatin (LIPITOR) 80 MG tablet TAKE 1 Tablet BY MOUTH ONCE EVERY DAY (Patient taking differently: Take 80 mg by mouth daily.)   budesonide-formoterol  (SYMBICORT) 80-4.5 MCG/ACT inhaler Take 2 puffs first thing in am and then another 2 puffs about 12 hours later. (Patient taking differently: Inhale 2 puffs into the lungs 2 (two) times daily as needed (wheeze, shortness of breath).)   Continuous Blood Gluc Receiver (DEXCOM G7 RECEIVER) DEVI Use to monitor BG continuously   Continuous Blood Gluc Sensor (DEXCOM G7 SENSOR) MISC Change sensor every 10 days   famotidine (PEPCID) 40 MG tablet Take 1 tablet (40 mg total) by mouth 2 (two) times daily. (Patient taking differently: Take 40 mg by mouth daily.)   GVOKE HYPOPEN 2-PACK 1 MG/0.2ML SOAJ Inject into the skin.   insulin aspart protamine - aspart (NOVOLOG MIX 70/30 FLEXPEN) (70-30) 100 UNIT/ML FlexPen Take 10 u BID with meal and slwoly go up to home dose as your oral intake picks up- follow up with your Primary Care doctor.   linaclotide (LINZESS) 145 MCG CAPS capsule Take 1 capsule (145 mcg total) by mouth daily before breakfast.   metFORMIN (GLUCOPHAGE) 500 MG tablet TAKE 1 Tablet  BY MOUTH TWICE DAILY WITH A MEAL (Patient taking differently: Take 500 mg by mouth 2 (two) times daily with a meal.)   midodrine (PROAMATINE) 10 MG tablet Take 1 tablet (10 mg total) by mouth 3 (three) times daily.   nitroGLYCERIN (NITROSTAT) 0.4 MG SL tablet Place 1 tablet (0.4 mg total) under the tongue every 5 (five) minutes as needed for chest pain.   polyethylene glycol (MIRALAX / GLYCOLAX) 17 g packet Take 17 g by mouth daily. (Patient taking differently: Take 17 g by mouth daily as needed for moderate constipation.)   prochlorperazine (COMPAZINE) 10 MG tablet Take 1 tablet (10 mg total) by mouth every 6 (six) hours as needed for nausea or vomiting.   promethazine (PHENERGAN) 25 MG tablet Take 25 mg by mouth daily as needed for nausea or vomiting.   sertraline (ZOLOFT) 100 MG tablet Take 2 tablets (200 mg total) by mouth at bedtime.   traZODone (DESYREL) 50 MG tablet Take 50 mg by mouth at bedtime.                    Past Medical History:  Diagnosis Date   Anxiety    Arthritis    Asthma    CAD (coronary artery disease)    Moderate LAD disease 2016 - Dr. 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, bilateral    Sleep apnea 2016   Type 2 diabetes mellitus (HCC)        Objective:     Vital signs reviewed  03/14/2023  - Note at rest 02 sats  97% on RA   General appearance:    disheveled amb wm nad >>> stated age      HEENT : Oropharynx  clear      NECK :  without  apparent JVD/ palpable Nodes/TM    LUNGS: no acc muscle use,  Min barrel  contour chest wall with bilateral  slightly decreased bs s audible wheeze and  without cough on insp or exp maneuvers and min  Hyperresonant  to  percussion bilaterally    CV:  RRR  no s3 or murmur or increase in P2, and no edema   ABD:  soft and nontender    MS:  Nl gait/ ext warm without deformities Or obvious joint restrictions  calf tenderness, cyanosis or clubbing     SKIN: warm and dry without lesions    NEURO:  alert, approp, nl sensorium with  no motor or cerebellar deficits apparent.           Ct with contrast 08/27/22  1. No CT evidence for acute intrathoracic abnormality. 2. Mild bilateral bronchial wall thickening. Minimal right upper lobe atelectasis.    Assessment

## 2023-03-14 ENCOUNTER — Ambulatory Visit (INDEPENDENT_AMBULATORY_CARE_PROVIDER_SITE_OTHER): Payer: MEDICAID | Admitting: Internal Medicine

## 2023-03-14 ENCOUNTER — Encounter: Payer: Self-pay | Admitting: Internal Medicine

## 2023-03-14 VITALS — BP 149/85 | HR 104 | Ht 68.0 in | Wt 154.0 lb

## 2023-03-14 DIAGNOSIS — J449 Chronic obstructive pulmonary disease, unspecified: Secondary | ICD-10-CM

## 2023-03-14 DIAGNOSIS — F17200 Nicotine dependence, unspecified, uncomplicated: Secondary | ICD-10-CM | POA: Diagnosis not present

## 2023-03-14 DIAGNOSIS — F4310 Post-traumatic stress disorder, unspecified: Secondary | ICD-10-CM | POA: Insufficient documentation

## 2023-03-14 NOTE — Assessment & Plan Note (Signed)
4-5 min discussion re active cigarette smoking in addition to office E&M  Ask about tobacco use:   active  Advise quitting  I took an extended  opportunity with this patient to outline the consequences of continued cigarette use  in airway disorders based on all the data we have from the multiple national lung health studies (perfomed over decades at millions of dollars in cost)  indicating that smoking cessation, not choice of inhalers or pulmonary physicians, is the most important aspect of his care.   Assess willingness:  Not committed at this point Assist in quit attempt:  Per PCP when ready Arrange follow up:   Follow up per Primary Care planned

## 2023-03-14 NOTE — Assessment & Plan Note (Addendum)
Active smoker/MM  - Labs ordered 06/11/2022  :  Eos  0.3   alpha one AT phenotype  MM level 147  - 06/11/2022  After extensive coaching inhaler device,  effectiveness =    75% try brezti > symbicort 80  - 06/11/2022 Patient walked at slow to moderate pace. No walking devices needed. Patient was only able to complete 250 ft due to chest and back pain "his usual" and sats still 99% RA -PFT's 02/08/23  FEV1 3.07 (85 % ) ratio 0.69  p 0 % improvement from saba p symbicort prior to study with DLCO  17.13 (63%)   and FV curve nl     He barely has copd at all and yet severely incapacitated chronically and it's difficult to put this together and understand how he uses his pulmonary or other meds   Rec No change in rx from pulm perspective F/u in 3 m with all meds in hand using a trust but verify approach to confirm accurate Medication  Reconciliation and separate out maint from prns so understand how he's using them.  The principal here is that until we are certain that the  patients are doing what we've asked, it makes no sense to ask them to do more.         Each maintenance medication was reviewed in detail including emphasizing most importantly the difference between maintenance and prns and under what circumstances the prns are to be triggered using an action plan format where appropriate.  Total time for H and P, chart review, counseling, reviewing hfa device(s) and generating customized AVS unique to this office visit / same day charting = 25 min

## 2023-03-14 NOTE — Patient Instructions (Signed)
No change in your medications   The key is to stop smoking completely before smoking completely stops you!  Please schedule a follow up office visit in 3 months , call sooner if needed with all medications /inhalers/ solutions in hand so we can verify exactly what you are taking. This includes all medications from all doctors and over the counters - PLEASE separate them into two bags:  the ones you take automatically, no matter what, vs the ones you take just when you feel you need them "BAG #2 is UP TO YOU"  - this will really help [place] help you take your medications more effectively.

## 2023-04-11 ENCOUNTER — Telehealth: Payer: Self-pay | Admitting: Internal Medicine

## 2023-04-11 ENCOUNTER — Other Ambulatory Visit: Payer: Self-pay | Admitting: Internal Medicine

## 2023-04-11 ENCOUNTER — Ambulatory Visit: Payer: MEDICAID | Admitting: Internal Medicine

## 2023-04-11 ENCOUNTER — Encounter: Payer: Self-pay | Admitting: Internal Medicine

## 2023-04-11 VITALS — BP 134/88 | HR 84 | Ht 68.0 in | Wt 156.0 lb

## 2023-04-11 DIAGNOSIS — E1165 Type 2 diabetes mellitus with hyperglycemia: Secondary | ICD-10-CM | POA: Insufficient documentation

## 2023-04-11 DIAGNOSIS — E1142 Type 2 diabetes mellitus with diabetic polyneuropathy: Secondary | ICD-10-CM

## 2023-04-11 DIAGNOSIS — E1159 Type 2 diabetes mellitus with other circulatory complications: Secondary | ICD-10-CM | POA: Diagnosis not present

## 2023-04-11 DIAGNOSIS — Z794 Long term (current) use of insulin: Secondary | ICD-10-CM | POA: Insufficient documentation

## 2023-04-11 DIAGNOSIS — E119 Type 2 diabetes mellitus without complications: Secondary | ICD-10-CM | POA: Insufficient documentation

## 2023-04-11 LAB — POCT GLYCOSYLATED HEMOGLOBIN (HGB A1C): Hemoglobin A1C: 9 % — AB (ref 4.0–5.6)

## 2023-04-11 LAB — POCT GLUCOSE (DEVICE FOR HOME USE): Glucose Fasting, POC: 374 mg/dL — AB (ref 70–99)

## 2023-04-11 MED ORDER — GVOKE HYPOPEN 2-PACK 1 MG/0.2ML ~~LOC~~ SOAJ: 1.0000 mg | Freq: Once | SUBCUTANEOUS | 1 refills | Status: AC

## 2023-04-11 MED ORDER — TRUE METRIX BLOOD GLUCOSE TEST VI STRP: ORAL_STRIP | 12 refills | Status: DC

## 2023-04-11 MED ORDER — FREESTYLE LIBRE 2 SENSOR MISC: 3 refills | Status: DC

## 2023-04-11 MED ORDER — NOVOLOG FLEXPEN 100 UNIT/ML ~~LOC~~ SOPN: PEN_INJECTOR | SUBCUTANEOUS | 3 refills | Status: DC

## 2023-04-11 MED ORDER — FREESTYLE LIBRE 3 PLUS SENSOR MISC: 1.0000 | 3 refills | Status: DC

## 2023-04-11 MED ORDER — METFORMIN HCL ER 500 MG PO TB24: 500.0000 mg | ORAL_TABLET | Freq: Two times a day (BID) | ORAL | 3 refills | Status: DC

## 2023-04-11 MED ORDER — INSULIN PEN NEEDLE 32G X 4 MM MISC: 1.0000 | Freq: Four times a day (QID) | 3 refills | Status: DC

## 2023-04-11 MED ORDER — LANTUS SOLOSTAR 100 UNIT/ML ~~LOC~~ SOPN: 14.0000 [IU] | PEN_INJECTOR | Freq: Every day | SUBCUTANEOUS | 11 refills | Status: DC

## 2023-04-11 NOTE — Telephone Encounter (Signed)
Done

## 2023-04-11 NOTE — Patient Instructions (Addendum)
STOP Novolog 70/30 Continue metformin 500 mg, 1 tablet twice daily Start Lantus 14 units once daily Novolog correctional insulin: Use the scale below to help guide you BEFORE each meal ( Breakfast, Lunch and Supper)   Blood sugar before meal Number of units to inject  Less than 175 0 unit  176 -  220 1 units  221 -  265 2 units  266 -  310 3 units  311 -  355 4 units  356 -  400 5 units  401 -  445 6 units  446 -  490 7 units   HOW TO TREAT LOW BLOOD SUGARS (Blood sugar LESS THAN 70 MG/DL) Please follow the RULE OF 15 for the treatment of hypoglycemia treatment (when your (blood sugars are less than 70 mg/dL)   STEP 1: Take 15 grams of carbohydrates when your blood sugar is low, which includes:  3-4 GLUCOSE TABS  OR 3-4 OZ OF JUICE OR REGULAR SODA OR ONE TUBE OF GLUCOSE GEL    STEP 2: RECHECK blood sugar in 15 MINUTES STEP 3: If your blood sugar is still low at the 15 minute recheck --> then, go back to STEP 1 and treat AGAIN with another 15 grams of carbohydrates.

## 2023-04-11 NOTE — Progress Notes (Signed)
Name: [name] [name]  MRN/ DOB: [id number], November 30, 1970   Age/ Sex: 52 y.o., male    PCP: Patient, No Pcp Per   Reason for Endocrinology Evaluation: Type 2 Diabetes Mellitus     Date of Initial Endocrinology Visit: 04/11/2023     PATIENT IDENTIFIER: [name] [name] is a 52 y.o. male with a past medical history of DM, HTN, CAD, COPD, OSA. The patient presented for initial endocrinology clinic visit on 04/11/2023 for consultative assistance with his diabetes management.    HPI: Mr. Settle    Diagnosed with DM 2017 Prior Medications tried/Intolerance: as listed  Currently checking blood sugars 2 x / day Hypoglycemia episodes : yes               Symptoms: yes                 Hemoglobin A1c has ranged from 7.8% in 2017, peaking at 15.5% in 2022.  Patient with history of DKA  The pt eats 3 meals a day , snacks occasionally. Drinks occasional Sugar-sweetened beverages   NO pancreatis   He took his insulin this am but didn't eat yet  Denies vomiting and has chronic nausea  Denies  diarrhea but has chronic constipation   HOME DIABETES REGIMEN: Metformin 500 mg twice daily NovoLog mix 10 units QAM and 5 units QPM      Statin: yes ACE-I/ARB: no   METER DOWNLOAD SUMMARY: n/a     DIABETIC COMPLICATIONS: Microvascular complications:  Neuropathy Denies: CKD  Last eye exam: Completed 2 yrs   Macrovascular complications:  CAD Denies:  PVD, CVA   PAST HISTORY: Past Medical History:  Past Medical History:  Diagnosis Date   Anxiety    Arthritis    Asthma    CAD (coronary artery disease)    a. 03/2015 Cath: LM nl, LAD mild diff dzs throughout w/ 50p/d, LCX diff dzs, 55m, RCA diff dzs, 104m-->Med rx; b. 12/2020 MV: EF 57%, small/mild apical inferior defect w/ partial rev/mild ischemia.   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, bilateral    Sleep apnea 2016   Type 2 diabetes mellitus (HCC)    Past Surgical History:  Past Surgical History:  Procedure Laterality Date   ANKLE SURGERY Right 1982   "had extra bones in there; took them out"   APPENDECTOMY  1975   BIOPSY  12/26/2018   Procedure: BIOPSY;  Surgeon: Malissa Hippo, MD;  [facility];  Service: Endoscopy;;  duodenal biopsies   BIOPSY  03/23/2019   Procedure: BIOPSY;  Surgeon: Malissa Hippo, MD;  [facility];  Service: Endoscopy;;  esophagus   CARDIAC CATHETERIZATION N/A 03/28/2015   Procedure: Left Heart Cath and Coronary Angiography;  Surgeon: Yates Decamp, MD;  Location: [facility] INVASIVE CV LAB;  Service: Cardiovascular;  Laterality: N/A;   CARDIAC CATHETERIZATION N/A 03/28/2015   Procedure: Intravascular Pressure Wire/FFR Study;  Surgeon: Yates Decamp, MD;  Location: [facility] INVASIVE CV LAB;  Service: Cardiovascular;  Laterality: N/A;   CARPAL TUNNEL RELEASE Left ~ 2008   COLONOSCOPY WITH ESOPHAGOGASTRODUODENOSCOPY (EGD)     COLONOSCOPY WITH PROPOFOL N/A  12/24/2019   Procedure: COLONOSCOPY WITH PROPOFOL;  Surgeon: Malissa Hippo, MD;  [facility]: [facility];  Service: Endoscopy;  Laterality: N/A;  955   ELBOW FRACTURE SURGERY Left ~ 2008   ESOPHAGEAL MANOMETRY N/A 09/16/2021   Procedure: ESOPHAGEAL MANOMETRY (EM);  Surgeon: Shellia Cleverly, DO;  Location: [facility];  Service: Endoscopy;  Laterality: N/A;   ESOPHAGOGASTRODUODENOSCOPY N/A 12/26/2018   Procedure: ESOPHAGOGASTRODUODENOSCOPY (EGD);  Surgeon: Malissa Hippo, MD;  [facility];  Service: Endoscopy;  Laterality: N/A;   ESOPHAGOGASTRODUODENOSCOPY (EGD) WITH PROPOFOL N/A 03/23/2019   Procedure: ESOPHAGOGASTRODUODENOSCOPY (EGD) WITH PROPOFOL;  Surgeon: Malissa Hippo, MD;  [facility];  Service: Endoscopy;   Laterality: N/A;  7:30   FRACTURE SURGERY     ankle and elbow   PH IMPEDANCE STUDY N/A 09/16/2021   Procedure: PH IMPEDANCE STUDY;  Surgeon: Shellia Cleverly, DO;  Location: [facility];  Service: Endoscopy;  Laterality: N/A;   PILONIDAL CYST EXCISION N/A 03/24/2017   Procedure: EXCISION CHRONIC  PILONIDAL ABSCESS;  Surgeon: [name] Miyamoto, MD;  Location: [facility];  Service: General;  Laterality: N/A;   SCROTAL EXPLORATION N/A 11/13/2020   Procedure: EXCISION OF SEBACEOUS CYSTS, SCROTUM;  Surgeon: Malen Gauze, MD;  Location: [facility];  Service: Urology;  Laterality: N/A;   TENDON REPAIR Left ~ 2004   "main tendon in my ankle"    Social History:  reports that he has been smoking cigarettes. He has a 34 pack-year smoking history. He has never been exposed to tobacco smoke. He has never used smokeless tobacco. He reports that he does not drink alcohol and does not use drugs. Family History:  Family History  Problem Relation Age of Onset   Diabetes Mother    Hypertension Mother    Cancer Mother    Throat cancer Mother    Diabetes Father    Hypertension Father    Hyperlipidemia Father    Congestive Heart Failure Sister    Colon cancer Neg Hx    Pancreatic cancer Neg Hx    Liver disease Neg Hx      HOME MEDICATIONS: Allergies as of 04/11/2023       Reactions   Prilosec Otc [omeprazole Magnesium] Swelling   Face swells, no breathing impairment   Bee Venom Swelling   Nexium [esomeprazole] Swelling   Face swells, no breathing impairment   Morphine And Codeine Nausea And Vomiting, Rash   Itching at IV site, dry heaving, burning sensation in throat.        Medication List        Accurate as of April 11, 2023  9:47 AM. If you have any questions, ask your nurse or doctor.          acetaminophen 500 MG tablet Commonly known as: TYLENOL Take 1,000 mg by mouth 2 (two) times daily as needed for moderate pain, fever or headache.   albuterol 108 (90 Base) MCG/ACT  inhaler Commonly known as: VENTOLIN HFA Inhale 2 puffs into the lungs every 6 (six) hours as needed for wheezing or shortness of breath.   ALPRAZolam 0.5 MG tablet Commonly known as: XANAX Take 0.5 mg by mouth at bedtime as needed.   amLODipine 5 MG tablet Commonly known as: NORVASC Take 5 mg by mouth daily.   ARIPiprazole 10 MG tablet Commonly known as: ABILIFY Take 10 mg by mouth daily.   aspirin EC 81 MG tablet Take 1 tablet by mouth daily with breakfast.   atorvastatin 80  MG tablet Commonly known as: LIPITOR TAKE 1 Tablet BY MOUTH ONCE EVERY DAY What changed: See the new instructions.   budesonide-formoterol 80-4.5 MCG/ACT inhaler Commonly known as: Symbicort Take 2 puffs first thing in am and then another 2 puffs about 12 hours later. What changed:  how much to take how to take this when to take this reasons to take this additional instructions   Dexcom G7 Receiver Devi Use to monitor BG continuously   ezetimibe 10 MG tablet Commonly known as: ZETIA Take 1 tablet (10 mg total) by mouth daily.   famotidine 40 MG tablet Commonly known as: PEPCID Take 1 tablet (40 mg total) by mouth 2 (two) times daily. What changed: when to take this   FreeStyle Libre 3 Plus Sensor Misc 1 Device by Other route every 14 (fourteen) days. Change sensor every 15 days. What changed:  how much to take how to take this when to take this additional instructions Changed by: Johnney Ou Cameren Earnest   gabapentin 300 MG capsule Commonly known as: Neurontin Take 1 capsule (300 mg total) by mouth every 8 (eight) hours as needed. What changed: reasons to take this   Gvoke HypoPen 2-Pack 1 MG/0.2ML Soaj Generic drug: Glucagon Inject into the skin.   linaclotide 145 MCG Caps capsule Commonly known as: Linzess Take 1 capsule (145 mcg total) by mouth daily before breakfast.   metFORMIN 500 MG tablet Commonly known as: GLUCOPHAGE TAKE 1 Tablet  BY MOUTH TWICE DAILY WITH A MEAL    midodrine 10 MG tablet Commonly known as: PROAMATINE Take 1 tablet (10 mg total) by mouth 3 (three) times daily.   nitroGLYCERIN 0.4 MG SL tablet Commonly known as: NITROSTAT Place 1 tablet (0.4 mg total) under the tongue every 5 (five) minutes as needed for chest pain.   NovoLOG Mix 70/30 FlexPen (70-30) 100 UNIT/ML FlexPen Generic drug: insulin aspart protamine - aspart Take 10 u BID with meal and slwoly go up to home dose as your oral intake picks up- follow up with your Primary Care doctor. What changed: additional instructions   polyethylene glycol 17 g packet Commonly known as: MIRALAX / GLYCOLAX Take 17 g by mouth daily. What changed:  when to take this reasons to take this   prochlorperazine 10 MG tablet Commonly known as: COMPAZINE Take 1 tablet (10 mg total) by mouth every 6 (six) hours as needed for nausea or vomiting.   promethazine 25 MG tablet Commonly known as: PHENERGAN Take 25 mg by mouth daily as needed for nausea or vomiting.   sertraline 100 MG tablet Commonly known as: ZOLOFT Take 2 tablets (200 mg total) by mouth at bedtime.   traZODone 50 MG tablet Commonly known as: DESYREL Take 50 mg by mouth at bedtime.         ALLERGIES: Allergies  Allergen Reactions   Prilosec Otc [Omeprazole Magnesium] Swelling    Face swells, no breathing impairment   Bee Venom Swelling   Nexium [Esomeprazole] Swelling    Face swells, no breathing impairment   Morphine And Codeine Nausea And Vomiting and Rash    Itching at IV site, dry heaving, burning sensation in throat.     REVIEW OF SYSTEMS: A comprehensive ROS was conducted with the patient and is negative except as per HPI     OBJECTIVE:   VITAL SIGNS: BP 134/88 (BP Location: Left Arm, Patient Position: Sitting, Cuff Size: Small)   Pulse 84   Ht 5\' 8"  (1.727 m)   Wt 156  lb (70.8 kg)   SpO2 99%   BMI 23.72 kg/m    PHYSICAL EXAM:  General: Pt appears well and is in NAD  Lungs: Clear with good BS  bilat   Heart: RRR   Abdomen:  soft, but tender   Extremities:  Lower extremities - No pretibial edema.   Neuro: MS is good with appropriate affect, pt is alert and Ox3    DM foot exam: 04/11/2023  The skin of the feet is intact without sores or ulcerations. The pedal pulses are undetectable The sensation is absent to a screening 5.07, 10 gram monofilament bilaterally   DATA REVIEWED:  Lab Results  Component Value Date   HGBA1C 9.0 (A) 04/11/2023   HGBA1C 13.2 (H) 08/29/2022   HGBA1C 12.9 (H) 08/28/2022    Latest Reference Range & Units 02/08/23 13:06  Sodium 135 - 145 mmol/L 137  Potassium 3.5 - 5.1 mmol/L 4.8  Chloride 98 - 111 mmol/L 104  CO2 22 - 32 mmol/L 24  Glucose 70 - 99 mg/dL 657 (H)  BUN 6 - 20 mg/dL 29 (H)  Creatinine 8.46 - 1.24 mg/dL 9.62 (H)  Calcium 8.9 - 10.3 mg/dL 8.3 (L)  Anion gap 5 - 15  9  Magnesium 1.7 - 2.4 mg/dL 1.8  GFR, Estimated >95 mL/min 55 (L)    In office BG 374 Mg/DL  ASSESSMENT / PLAN / RECOMMENDATIONS:   1) Type 2 Diabetes Mellitus, Poorly controlled, With neuropathic and macrovascular complications - Most recent A1c of 9.0 %. Goal A1c < 7.0 %.    - I have discussed with the patient the pathophysiology of diabetes. We went over the natural progression of the disease. We stressed the importance of lifestyle changes. I explained the complications associated with diabetes including retinopathy, nephropathy, neuropathy as well as increased risk of cardiovascular disease. We went over the benefit seen with glycemic control.   - I explained to the patient that diabetic patients are at higher than normal risk for amputations.   - Patient with history of DKA, not a candidate for SGLT2 inhibitors -Patient would like to avoid further weight loss, so will avoid GLP-1 agonist at this time -I have recommended switching insulin mix to basal/prandial as below -Freestyle libre 3+ has been sent to the pharmacy  MEDICATIONS: Stop NovoLog  Mix Continue metformin 500 mg XR, 1 tab twice daily Start Lantus 14 units once daily Start CF: NovoLog (BG-130/45) TIDQAC   EDUCATION / INSTRUCTIONS: BG monitoring instructions: Patient is instructed to check his blood sugars 3 times a day. Call [facility] [place] [facility] if: BG persistently < 70  I reviewed the Rule of 15 for the treatment of hypoglycemia in detail with the patient. Literature supplied.   2) Diabetic complications:  Eye: Does not have known diabetic retinopathy.  Neuro/ Feet: Does  have known diabetic peripheral neuropathy. Renal: Patient does not have known baseline CKD. He is not on an ACEI/ARB at present.    Follow-up in 3 months  Signed electronically by: Lyndle Herrlich, MD  [facility]  [facility] [address] [place], [place] [address] Phone: [phone] FAX: [phone]   CC: Patient, No Pcp Per No address on file Phone: None  Fax: None    Return to Endocrinology clinic as below: Future Appointments  Date Time Provider Department Center  05/10/2023  8:40 AM Jonelle Sidle, MD [facility] [place] [facility]  07/11/2023  8:15 AM [name], MD [facility] None  01/17/2024  1:30 PM [facility] None  01/17/2024  2:30 PM Pennington, Rebekah M, PA-C [facility] None

## 2023-04-11 NOTE — Telephone Encounter (Signed)
Patient was seen this morning. Pharmacy can not fill Free Style 3 Plus sensors, because Medicaid will only pay for Free Style 2 sensors. He is also asking for True Metric test strips to be sent to his pharmacy. Patient is out of both sensors and test strips and has no way to test blood sugar.

## 2023-04-12 ENCOUNTER — Other Ambulatory Visit: Payer: Self-pay

## 2023-04-12 MED ORDER — ACCU-CHEK FASTCLIX LANCETS MISC: 11 refills | Status: DC

## 2023-04-12 MED ORDER — ACCU-CHEK GUIDE VI STRP: ORAL_STRIP | 12 refills | Status: DC

## 2023-04-12 MED ORDER — NOVOLOG FLEXPEN 100 UNIT/ML ~~LOC~~ SOPN: PEN_INJECTOR | SUBCUTANEOUS | 3 refills | Status: DC

## 2023-04-12 MED ORDER — ACCU-CHEK GUIDE W/DEVICE KIT: PACK | 0 refills | Status: DC

## 2023-05-10 ENCOUNTER — Ambulatory Visit: Payer: MEDICAID | Attending: Cardiology | Admitting: Cardiology

## 2023-05-10 ENCOUNTER — Encounter: Payer: Self-pay | Admitting: Cardiology

## 2023-05-10 VITALS — BP 138/90 | HR 91 | Ht 68.0 in | Wt 163.6 lb

## 2023-05-10 DIAGNOSIS — E782 Mixed hyperlipidemia: Secondary | ICD-10-CM | POA: Diagnosis not present

## 2023-05-10 DIAGNOSIS — I2510 Atherosclerotic heart disease of native coronary artery without angina pectoris: Secondary | ICD-10-CM

## 2023-05-10 DIAGNOSIS — I951 Orthostatic hypotension: Secondary | ICD-10-CM

## 2023-05-10 DIAGNOSIS — Z720 Tobacco use: Secondary | ICD-10-CM

## 2023-05-10 NOTE — Patient Instructions (Signed)
Medication Instructions:  Your physician recommends that you continue on your current medications as directed. Please refer to the Current Medication list given to you today.   Labwork: None today  Testing/Procedures: None today  Follow-Up: 6 months  Any Other Special Instructions Will Be Listed Below (If Applicable).  If you need a refill on your cardiac medications before your next appointment, please call your pharmacy.  

## 2023-05-10 NOTE — Progress Notes (Signed)
Cardiology Office Note  Date: 05/10/2023   ID: [name] [name], DOB 07-27-1970, MRN [id number]  History of Present Illness: [name] [name] is a 52 y.o. male last seen in June by Ms. Lilla Shook, I reviewed the note.  He is here for a routine visit.  Reports no angina or nitroglycerin use in the interim, no increasing dyspnea on exertion.  Orthostatic dizziness has been stable on midodrine, no frank syncope.  Still working on glucose control with his endocrinologist.  Insulin was adjusted most recently.  I reviewed his medications.  Current regimen includes aspirin, Norvasc, Lipitor, Zetia, ProAmatine, and as needed nitroglycerin.  He reports compliance with Lipitor and tolerating last addition of Zetia.  His LDL has come down from 181-98 so far.  I rechecked his blood pressure today at 138/90.  Physical Exam: VS:  BP (!) 138/90   Pulse 91   Ht 5\' 8"  (1.727 m)   Wt 163 lb 9.6 oz (74.2 kg)   SpO2 98%   BMI 24.88 kg/m , BMI Body mass index is 24.88 kg/m.  Wt Readings from Last 3 Encounters:  05/10/23 163 lb 9.6 oz (74.2 kg)  04/11/23 156 lb (70.8 kg)  03/14/23 154 lb (69.9 kg)    General: Patient appears comfortable at rest. HEENT: Conjunctiva and lids normal. Neck: Supple, no elevated JVP or carotid bruits. Lungs: Clear to auscultation, nonlabored breathing at rest. Cardiac: Regular rate and rhythm, no S3 or significant systolic murmur. Extremities: No pitting edema.  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  Labwork: 08/27/2022: ALT 12; AST 17 08/28/2022: TSH 1.378 02/08/2023: BUN 29; Creatinine, Ser 1.51; Hemoglobin 14.6; Magnesium 1.8; Platelets 262; Potassium 4.8; Sodium 137     Component Value Date/Time   CHOL 173 11/09/2022 1036   TRIG 110 11/09/2022 1036   HDL 34 (L) 11/09/2022 1036   CHOLHDL 5.1 11/09/2022 1036   VLDL 22 11/09/2022 1036   LDLCALC  117 (H) 11/09/2022 1036   LDLDIRECT 181.3 (H) 11/03/2020 1139  October 2024: Hemoglobin 14.3, platelets 239, BUN 20, creatinine 1.15, potassium 5.1, AST 14, ALT 11, cholesterol 162, triglycerides 130, HDL 41, LDL 98, hemoglobin A1c 9.5%, TSH 1.68  Other Studies Reviewed Today:  Echocardiogram 08/28/2022:  1. Left ventricular ejection fraction, by estimation, is 55 to 60%. The  left ventricle has normal function. The left ventricle has no regional  wall motion abnormalities. There is mild concentric left ventricular  hypertrophy. Left ventricular diastolic  parameters are consistent with Grade I diastolic dysfunction (impaired  relaxation).   2. Right ventricular systolic function is normal. The right ventricular  size is normal. Tricuspid regurgitation signal is inadequate for assessing  PA pressure.   3. The mitral valve leaflets appear mildly thickened for age. . The  mitral valve is abnormal. No evidence of mitral valve regurgitation. No  evidence of mitral stenosis.   4. The aortic valve is tricuspid. Aortic valve regurgitation is not  visualized. Aortic valve sclerosis is present, with no evidence of aortic  valve stenosis.   5. The inferior vena cava is dilated in size with <50% respiratory  variability, suggesting right atrial pressure of 15 mmHg.   Assessment and Plan:  1.  CAD, moderate nonobstructive LAD stenosis documented in 2016.  Follow-up Myoview in 2022 suggested mild inferior apical ischemic territory which was managed medically.  Echocardiogram in March of this year revealed  LVEF 55 to 60%.  He does not describe any angina or interval nitroglycerin use.  Continue aspirin, Lipitor, and Zetia.  2.  Orthostatic hypotension.  Tolerating midodrine with improved symptoms overall.  3.  Mixed hyperlipidemia.  Continue Lipitor and Zetia, LDL has come down from 181-98 so far.  4.  Tobacco abuse.  Smoking cessation discussed over time.  Disposition:  Follow up  6  months.  Signed, Jonelle Sidle, M.D., F.A.C.C. [facility]

## 2023-05-16 ENCOUNTER — Telehealth: Payer: Self-pay | Admitting: Neurology

## 2023-05-16 NOTE — Telephone Encounter (Signed)
LVM and sent mychart msg informing pt of need to reschedule 07/11/23 appt - MD out

## 2023-06-13 ENCOUNTER — Other Ambulatory Visit: Payer: Self-pay

## 2023-06-13 ENCOUNTER — Encounter (HOSPITAL_COMMUNITY): Payer: Self-pay

## 2023-06-13 ENCOUNTER — Emergency Department (HOSPITAL_COMMUNITY): Payer: MEDICAID

## 2023-06-13 ENCOUNTER — Encounter (HOSPITAL_BASED_OUTPATIENT_CLINIC_OR_DEPARTMENT_OTHER): Payer: MEDICAID | Attending: Internal Medicine | Admitting: Internal Medicine

## 2023-06-13 ENCOUNTER — Emergency Department (HOSPITAL_COMMUNITY)
Admission: EM | Admit: 2023-06-13 | Discharge: 2023-06-13 | Disposition: A | Discharge status: Home / Self Care | Payer: MEDICAID | Attending: Emergency Medicine | Admitting: Emergency Medicine

## 2023-06-13 DIAGNOSIS — Z7982 Long term (current) use of aspirin: Secondary | ICD-10-CM | POA: Diagnosis not present

## 2023-06-13 DIAGNOSIS — L97528 Non-pressure chronic ulcer of other part of left foot with other specified severity: Secondary | ICD-10-CM

## 2023-06-13 DIAGNOSIS — I771 Stricture of artery: Secondary | ICD-10-CM | POA: Insufficient documentation

## 2023-06-13 DIAGNOSIS — I70245 Atherosclerosis of native arteries of left leg with ulceration of other part of foot: Secondary | ICD-10-CM | POA: Diagnosis not present

## 2023-06-13 DIAGNOSIS — L97522 Non-pressure chronic ulcer of other part of left foot with fat layer exposed: Secondary | ICD-10-CM

## 2023-06-13 DIAGNOSIS — I2510 Atherosclerotic heart disease of native coronary artery without angina pectoris: Secondary | ICD-10-CM | POA: Insufficient documentation

## 2023-06-13 DIAGNOSIS — Z7984 Long term (current) use of oral hypoglycemic drugs: Secondary | ICD-10-CM | POA: Insufficient documentation

## 2023-06-13 DIAGNOSIS — E11621 Type 2 diabetes mellitus with foot ulcer: Secondary | ICD-10-CM

## 2023-06-13 DIAGNOSIS — Z79899 Other long term (current) drug therapy: Secondary | ICD-10-CM | POA: Diagnosis not present

## 2023-06-13 DIAGNOSIS — I10 Essential (primary) hypertension: Secondary | ICD-10-CM | POA: Insufficient documentation

## 2023-06-13 DIAGNOSIS — D72829 Elevated white blood cell count, unspecified: Secondary | ICD-10-CM | POA: Diagnosis not present

## 2023-06-13 DIAGNOSIS — E119 Type 2 diabetes mellitus without complications: Secondary | ICD-10-CM | POA: Insufficient documentation

## 2023-06-13 DIAGNOSIS — Z794 Long term (current) use of insulin: Secondary | ICD-10-CM | POA: Diagnosis not present

## 2023-06-13 DIAGNOSIS — J449 Chronic obstructive pulmonary disease, unspecified: Secondary | ICD-10-CM | POA: Insufficient documentation

## 2023-06-13 DIAGNOSIS — E1151 Type 2 diabetes mellitus with diabetic peripheral angiopathy without gangrene: Secondary | ICD-10-CM | POA: Diagnosis not present

## 2023-06-13 DIAGNOSIS — F17210 Nicotine dependence, cigarettes, uncomplicated: Secondary | ICD-10-CM | POA: Insufficient documentation

## 2023-06-13 DIAGNOSIS — M79672 Pain in left foot: Secondary | ICD-10-CM | POA: Diagnosis present

## 2023-06-13 DIAGNOSIS — I96 Gangrene, not elsewhere classified: Secondary | ICD-10-CM | POA: Diagnosis not present

## 2023-06-13 LAB — I-STAT CG4 LACTIC ACID, ED: Lactic Acid, Venous: 1.2 mmol/L (ref 0.5–1.9)

## 2023-06-13 LAB — CBC WITH DIFFERENTIAL/PLATELET
Abs Immature Granulocytes: 0.05 10*3/uL (ref 0.00–0.07)
Basophils Absolute: 0.1 10*3/uL (ref 0.0–0.1)
Basophils Relative: 1 %
Eosinophils Absolute: 0.3 10*3/uL (ref 0.0–0.5)
Eosinophils Relative: 2 %
HCT: 43.5 % (ref 39.0–52.0)
Hemoglobin: 14.2 g/dL (ref 13.0–17.0)
Immature Granulocytes: 0 %
Lymphocytes Relative: 18 %
Lymphs Abs: 2.4 10*3/uL (ref 0.7–4.0)
MCH: 30.9 pg (ref 26.0–34.0)
MCHC: 32.6 g/dL (ref 30.0–36.0)
MCV: 94.8 fL (ref 80.0–100.0)
Monocytes Absolute: 0.7 10*3/uL (ref 0.1–1.0)
Monocytes Relative: 5 %
Neutro Abs: 9.7 10*3/uL — ABNORMAL HIGH (ref 1.7–7.7)
Neutrophils Relative %: 74 %
Platelets: 251 10*3/uL (ref 150–400)
RBC: 4.59 MIL/uL (ref 4.22–5.81)
RDW: 13 % (ref 11.5–15.5)
WBC: 13.2 10*3/uL — ABNORMAL HIGH (ref 4.0–10.5)
nRBC: 0 % (ref 0.0–0.2)

## 2023-06-13 LAB — BASIC METABOLIC PANEL
Anion gap: 9 (ref 5–15)
BUN: 21 mg/dL — ABNORMAL HIGH (ref 6–20)
CO2: 24 mmol/L (ref 22–32)
Calcium: 8.7 mg/dL — ABNORMAL LOW (ref 8.9–10.3)
Chloride: 106 mmol/L (ref 98–111)
Creatinine, Ser: 1.32 mg/dL — ABNORMAL HIGH (ref 0.61–1.24)
GFR, Estimated: >60 mL/min (ref >60)
Glucose, Bld: 106 mg/dL — ABNORMAL HIGH (ref 70–99)
Potassium: 4.4 mmol/L (ref 3.5–5.1)
Sodium: 139 mmol/L (ref 135–145)

## 2023-06-13 MED ORDER — DOXYCYCLINE HYCLATE 100 MG PO CAPS
100.0000 mg | ORAL_CAPSULE | Freq: Two times a day (BID) | ORAL | 0 refills | Status: DC
Start: 2023-06-13 — End: 2023-07-12

## 2023-06-13 MED ORDER — IOHEXOL 350 MG/ML SOLN: 100.0000 mL | Freq: Once | INTRAVENOUS | Status: DC | PRN

## 2023-06-13 NOTE — ED Notes (Signed)
 Pt refusing labs

## 2023-06-13 NOTE — Consult Note (Signed)
 Hospital Consult    Reason for Consult: Left foot wound with abnormal ABIs Referring Physician: [facility] MRN #:  [id number]  History of Present Illness: This is a 53 y.o. male with history of tobacco abuse, diabetes, hypertension, hyperlipidemia, COPD that vascular surgery was consulted for left foot wound with abnormal ABIs.  Reportedly has had a wound on the top of his left foot since September 2024.  Now over the last week has developed a left fifth toe dry gangrene.  Left foot is motor and sensory intact.  Was seen in the wound clinic today and reportedly had an ABI of 0.3 and then sent to the ED.  Past Medical History:  Diagnosis Date   Anxiety    Arthritis    Asthma    CAD (coronary artery disease)    a. 03/2015 Cath: LM nl, LAD mild diff dzs throughout w/ 50p/d, LCX diff dzs, 24m, RCA diff dzs, 75m-->Med rx; b. 12/2020 MV: EF 57%, small/mild apical inferior defect w/ partial rev/mild ischemia.   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, bilateral    Sleep apnea 2016   Type 2 diabetes mellitus (HCC)     Past Surgical History:  Procedure Laterality Date   ANKLE SURGERY Right 1982   "had extra bones in there; took them out"   APPENDECTOMY  1975   BIOPSY  12/26/2018   Procedure: BIOPSY;  Surgeon: Malissa Hippo, MD;  [facility];  Service: Endoscopy;;  duodenal biopsies   BIOPSY  03/23/2019   Procedure: BIOPSY;  Surgeon: Malissa Hippo, MD;  [facility];  Service: Endoscopy;;  esophagus   CARDIAC CATHETERIZATION N/A 03/28/2015   Procedure: Left Heart Cath and Coronary Angiography;  Surgeon: Yates Decamp, MD;  Location: [facility] INVASIVE CV LAB;  Service: Cardiovascular;  Laterality: N/A;    CARDIAC CATHETERIZATION N/A 03/28/2015   Procedure: Intravascular Pressure Wire/FFR Study;  Surgeon: Yates Decamp, MD;  Location: [facility] INVASIVE CV LAB;  Service: Cardiovascular;  Laterality: N/A;   CARPAL TUNNEL RELEASE Left ~ 2008   COLONOSCOPY WITH ESOPHAGOGASTRODUODENOSCOPY (EGD)     COLONOSCOPY WITH PROPOFOL N/A 12/24/2019   Procedure: COLONOSCOPY WITH PROPOFOL;  Surgeon: Malissa Hippo, MD;  [facility]: [facility];  Service: Endoscopy;  Laterality: N/A;  955   ELBOW FRACTURE SURGERY Left ~ 2008   ESOPHAGEAL MANOMETRY N/A 09/16/2021   Procedure: ESOPHAGEAL MANOMETRY (EM);  Surgeon: Shellia Cleverly, DO;  Location: [facility];  Service: Endoscopy;  Laterality: N/A;   ESOPHAGOGASTRODUODENOSCOPY N/A 12/26/2018   Procedure: ESOPHAGOGASTRODUODENOSCOPY (EGD);  Surgeon: Malissa Hippo, MD;  [facility];  Service: Endoscopy;  Laterality: N/A;   ESOPHAGOGASTRODUODENOSCOPY (EGD) WITH PROPOFOL N/A 03/23/2019   Procedure: ESOPHAGOGASTRODUODENOSCOPY (EGD) WITH PROPOFOL;  Surgeon: Malissa Hippo, MD;  [facility];  Service: Endoscopy;  Laterality: N/A;  7:30   FRACTURE SURGERY     ankle and elbow   PH IMPEDANCE STUDY N/A 09/16/2021   Procedure: PH IMPEDANCE STUDY;  Surgeon: Shellia Cleverly, DO;  Location: [facility];  Service: Endoscopy;  Laterality: N/A;   PILONIDAL CYST EXCISION N/A 03/24/2017   Procedure: EXCISION  CHRONIC  PILONIDAL ABSCESS;  Surgeon: [name] Miyamoto, MD;  Location: [facility];  Service: General;  Laterality: N/A;   SCROTAL EXPLORATION N/A 11/13/2020   Procedure: EXCISION OF SEBACEOUS CYSTS, SCROTUM;  Surgeon: Malen Gauze, MD;  Location: [facility];  Service: Urology;  Laterality: N/A;   TENDON REPAIR Left ~ 2004   "main tendon in my ankle"    Allergies  Allergen Reactions   Prilosec Otc [Omeprazole Magnesium] Swelling    Face swells, no breathing impairment   Bee Venom Swelling   Nexium [Esomeprazole] Swelling    Face swells, no breathing  impairment   Morphine And Codeine Nausea And Vomiting and Rash    Itching at IV site, dry heaving, burning sensation in throat.    Prior to Admission medications   Medication Sig Start Date End Date Taking? Authorizing Provider  doxycycline (VIBRAMYCIN) 100 MG capsule Take 1 capsule (100 mg total) by mouth 2 (two) times daily. 06/13/23  Yes Lunette Stands, PA-C  Accu-Chek FastClix Lancets MISC Check blood sugar 3 times daily 04/12/23   Shamleffer, Konrad Dolores, MD  acetaminophen (TYLENOL) 500 MG tablet Take 1,000 mg by mouth 2 (two) times daily as needed for moderate pain, fever or headache.    [provider]  albuterol (VENTOLIN HFA) 108 (90 Base) MCG/ACT inhaler Inhale 2 puffs into the lungs every 6 (six) hours as needed for wheezing or shortness of breath.    [provider]  ALPRAZolam Prudy Feeler) 0.5 MG tablet Take 0.5 mg by mouth at bedtime as needed. 03/11/23   [provider]  amLODipine (NORVASC) 5 MG tablet Take 5 mg by mouth daily. 02/07/23   [provider]  ARIPiprazole (ABILIFY) 10 MG tablet Take 10 mg by mouth daily.    [provider]  aspirin EC 81 MG tablet Take 1 tablet by mouth daily with breakfast. 02/14/21   [provider]  atorvastatin (LIPITOR) 80 MG tablet TAKE 1 Tablet BY MOUTH ONCE EVERY DAY Patient taking differently: Take 80 mg by mouth daily. 04/07/22   [name], PA-C  Blood Glucose Monitoring Suppl (ACCU-CHEK GUIDE) w/Device KIT Use to check blood sugar 3 times daily 04/12/23   Shamleffer, Konrad Dolores, MD  budesonide-formoterol (SYMBICORT) 80-4.5 MCG/ACT inhaler Take 2 puffs first thing in am and then another 2 puffs about 12 hours later. Patient taking differently: Inhale 2 puffs into the lungs 2 (two) times daily as needed (wheeze, shortness of breath). 06/11/22   Nyoka Cowden, MD  Continuous Glucose Sensor (FREESTYLE LIBRE 2 SENSOR) MISC Change sensors every 14 days 04/11/23   Shamleffer, Konrad Dolores, MD  Continuous Glucose Sensor (FREESTYLE LIBRE 3 PLUS SENSOR) MISC 1 Device by Other route every 14 (fourteen) days. Change sensor every 15 days. 04/11/23   Shamleffer, Konrad Dolores, MD  ezetimibe (ZETIA) 10 MG tablet Take 1 tablet (10 mg total) by mouth daily. 11/11/22   Dyann Kief, PA-C  famotidine (PEPCID) 40 MG tablet Take 1 tablet (40 mg total) by mouth 2 (two) times daily. Patient taking differently: Take 40 mg by mouth daily. 05/26/21   [name], PA-C  gabapentin (NEURONTIN) 300 MG capsule Take 1 capsule (300 mg total) by mouth every 8 (eight) hours as needed. Patient taking differently: Take 300 mg by mouth every 8 (eight) hours as needed (neuropathy). 10/22/21   Terald Sleeper, MD  glucose blood (ACCU-CHEK GUIDE) test strip Check blood sugar 3 times daily 04/12/23   Shamleffer, Konrad Dolores, MD  glucose  blood (TRUE METRIX BLOOD GLUCOSE TEST) test strip Check blood sugar 3 times daily 04/11/23   Shamleffer, Konrad Dolores, MD  insulin aspart (NOVOLOG FLEXPEN) 100 UNIT/ML FlexPen Start CF: NovoLog (BG-130/45) TIDQAC  Max daily 30 units 04/12/23   Shamleffer, Konrad Dolores, MD  insulin glargine (LANTUS SOLOSTAR) 100 UNIT/ML Solostar Pen Inject 14 Units into the skin daily. 04/11/23   Shamleffer, Konrad Dolores, MD  Insulin Pen Needle 32G X 4 MM MISC 1 Device by Does not apply route in the morning, at noon, in the evening, and at bedtime. 04/11/23   Shamleffer, Konrad Dolores, MD  linaclotide [facility]) 145 MCG CAPS capsule Take 1 capsule (145 mcg total) by mouth daily before breakfast. 06/21/22 06/16/23  Cirigliano, Vito V, DO  metFORMIN (GLUCOPHAGE-XR) 500 MG 24 hr tablet Take 1 tablet (500 mg total) by mouth 2 (two) times daily with a meal. 04/11/23   Shamleffer, Konrad Dolores, MD  midodrine (PROAMATINE) 10 MG tablet Take 1 tablet (10 mg total) by mouth 3 (three) times daily. 08/04/22   Creig Hines, NP  nitroGLYCERIN (NITROSTAT) 0.4 MG SL tablet  Place 1 tablet (0.4 mg total) under the tongue every 5 (five) minutes as needed for chest pain. 10/05/17   [name], PA-C  polyethylene glycol (MIRALAX / GLYCOLAX) 17 g packet Take 17 g by mouth daily. Patient taking differently: Take 17 g by mouth daily as needed for moderate constipation. 11/11/20   Gilda Crease, MD  prochlorperazine (COMPAZINE) 10 MG tablet Take 1 tablet (10 mg total) by mouth every 6 (six) hours as needed for nausea or vomiting. 07/07/22   Dione Booze, MD  promethazine (PHENERGAN) 25 MG tablet Take 25 mg by mouth daily as needed for nausea or vomiting.    [provider]  sertraline (ZOLOFT) 100 MG tablet Take 2 tablets (200 mg total) by mouth at bedtime. 08/30/22   Lanae Boast, MD  traZODone (DESYREL) 50 MG tablet Take 50 mg by mouth at bedtime. 10/03/20   [provider]    Social History   Socioeconomic History   Marital status: Single    Spouse name: Not on file   Number of children: 0   Years of education: 10th g   Highest education level: Not on file  Occupational History   Occupation: [employer]  Tobacco Use   Smoking status: Every Day    Current packs/day: 1.00    Average packs/day: 1 pack/day for 34.0 years (34.0 ttl pk-yrs)    Types: Cigarettes    Passive exposure: Never   Smokeless tobacco: Never   Tobacco comments:    1/2 pack a day  Vaping Use   Vaping status: Never Used  Substance and Sexual Activity   Alcohol use: Never    Alcohol/week: 0.0 standard drinks of alcohol   Drug use: Never   Sexual activity: Not Currently  Other Topics Concern   Not on file  Social History Narrative   His main caretaker, his mother has terminal metastatic cancer.   Social Drivers of Health   Financial Resource Strain: High Risk (06/24/2022)   Overall Financial Resource Strain (CARDIA)    Difficulty of Paying Living Expenses: Very hard  Food Insecurity: Food Insecurity Present (06/18/2022)   Hunger Vital Sign    Worried  About Running Out of Food in the Last Year: Often true    Ran Out of Food in the Last Year: Often true  Transportation Needs: No Transportation Needs (06/18/2022)   PRAPARE -  Administrator, Civil Service (Medical): No    Lack of Transportation (Non-Medical): No  Physical Activity: Not on file  Stress: Not on file  Social Connections: Unknown (05/25/2022)   Received from [facility], [facility]   Social Network    Social Network: Not on file  Intimate Partner Violence: Not At Risk (06/18/2022)   Humiliation, Afraid, Rape, and Kick questionnaire    Fear of Current or Ex-Partner: No    Emotionally Abused: No    Physically Abused: No    Sexually Abused: No     Family History  Problem Relation Age of Onset   Diabetes Mother    Hypertension Mother    Cancer Mother    Throat cancer Mother    Diabetes Father    Hypertension Father    Hyperlipidemia Father    Congestive Heart Failure Sister    Colon cancer Neg Hx    Pancreatic cancer Neg Hx    Liver disease Neg Hx     ROS: [x]  Positive   [ ]  Negative   [ ]  All sytems reviewed and are negative  Cardiovascular: []  chest pain/pressure []  palpitations []  SOB lying flat []  DOE []  pain in legs while walking []  pain in legs at rest []  pain in legs at night []  non-healing ulcers []  hx of DVT []  swelling in legs  Pulmonary: []  productive cough []  asthma/wheezing []  home O2  Neurologic: []  weakness in []  arms []  legs []  numbness in []  arms []  legs []  hx of CVA []  mini stroke [] difficulty speaking or slurred speech []  temporary loss of vision in one eye []  dizziness  Hematologic: []  hx of cancer []  bleeding problems []  problems with blood clotting easily  Endocrine:   []  diabetes []  thyroid disease  GI []  vomiting blood []  blood in stool  GU: []  CKD/renal failure []  HD--[]  M/W/F or []  T/T/S []  burning with urination []  blood in urine  Psychiatric: []  anxiety []   depression  Musculoskeletal: []  arthritis []  joint pain  Integumentary: []  rashes []  ulcers  Constitutional: []  fever []  chills   Physical Examination  Vitals:   06/13/23 1404 06/13/23 1830  BP: 134/86 (!) 149/87  Pulse: 99 93  Resp: 16 18  Temp: 97.9 F (36.6 C) 98.9 F (37.2 C)  SpO2: 100% 100%   Body mass index is 24.81 kg/m.  General:  WDWN in NAD Gait: Not observed HENT: WNL, normocephalic Pulmonary: normal non-labored breathing Cardiac: regular, without  Murmurs, rubs or gallops Abdomen:  soft, NT/ND Vascular Exam/Pulses: Bilateral common femoral pulses palpable No palpable pedal pulses Left leg tissue loss as pictured on the dorsum and left 5th toe Musculoskeletal: no muscle wasting or atrophy  Neurologic: A&O X 3; Appropriate Affect ; SENSATION: normal; MOTOR FUNCTION:  moving all extremities equally. Speech is fluent/normal       CBC    Component Value Date/Time   WBC 13.2 (H) 06/13/2023 1805   RBC 4.59 06/13/2023 1805   HGB 14.2 06/13/2023 1805   HCT 43.5 06/13/2023 1805   PLT 251 06/13/2023 1805   MCV 94.8 06/13/2023 1805   MCH 30.9 06/13/2023 1805   MCHC 32.6 06/13/2023 1805   RDW 13.0 06/13/2023 1805   LYMPHSABS 2.4 06/13/2023 1805   MONOABS 0.7 06/13/2023 1805   EOSABS 0.3 06/13/2023 1805   BASOSABS 0.1 06/13/2023 1805    BMET    Component Value Date/Time   NA 139 06/13/2023 1805   K 4.4 06/13/2023 1805  CL 106 06/13/2023 1805   CO2 24 06/13/2023 1805   GLUCOSE 106 (H) 06/13/2023 1805   BUN 21 (H) 06/13/2023 1805   CREATININE 1.32 (H) 06/13/2023 1805   CREATININE 1.05 01/23/2019 0927   CALCIUM 8.7 (L) 06/13/2023 1805   GFRNONAA >60 06/13/2023 1805   GFRNONAA 83 01/23/2019 0927   GFRAA >60 01/11/2020 0859   GFRAA 96 01/23/2019 0927    COAGS: Lab Results  Component Value Date   INR 1.0 01/09/2022   INR 1.0 11/29/2021   INR 1.0 09/03/2020     Non-Invasive Vascular Imaging:    ABIs from the wound center were  reportedly 0.3 although I do not have these records  ASSESSMENT/PLAN: This is a 53 y.o. male  with history of tobacco abuse, diabetes, hypertension, hyperlipidemia, COPD that vascular surgery was consulted for left foot wound with abnormal ABIs.  Reportedly has had a wound on the top of his left foot since September 2024.  Now over the last week has developed a left fifth toe dry gangrene. Was seen in the wound clinic today and reportedly had an ABI of 0.3.  I have offered him transfer to [facility] for angiogram tomorrow - currently in [place] ED.  Patient does not want transfer to [facility] and states he would like to be discharged.  He would like to arrange to have this procedure electively when he has family present.  He is tearful in the ED.  I discussed he is at risk of limb loss for any delay of intervention.  Ultimately after discussing options we will plan discharge on oral antibiotics.  Gangrene on his toe is dry at this time.  I will arrange elective angiogram in the next week.  Risk benefits discussed and discussed transfemoral access to evaluate left lower extremity runoff and if no endovascular options may require surgical bypass.  All questions answered.  My office will contact patient to arrange.  Discussed if he has any worsening symptoms to come back to the ED.  Cephus Shelling, MD [facility] Office: [phone]  Cephus Shelling

## 2023-06-13 NOTE — H&P (View-Only) (Signed)
Hospital Consult    Reason for Consult: Left foot wound with abnormal ABIs Referring Physician: [facility] MRN #:  [id number]  History of Present Illness: This is a 53 y.o. male with history of tobacco abuse, diabetes, hypertension, hyperlipidemia, COPD that vascular surgery was consulted for left foot wound with abnormal ABIs.  Reportedly has had a wound on the top of his left foot since September 2024.  Now over the last week has developed a left fifth toe dry gangrene.  Left foot is motor and sensory intact.  Was seen in the wound clinic today and reportedly had an ABI of 0.3 and then sent to the ED.  Past Medical History:  Diagnosis Date   Anxiety    Arthritis    Asthma    CAD (coronary artery disease)    a. 03/2015 Cath: LM nl, LAD mild diff dzs throughout w/ 50p/d, LCX diff dzs, 24m, RCA diff dzs, 75m-->Med rx; b. 12/2020 MV: EF 57%, small/mild apical inferior defect w/ partial rev/mild ischemia.   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, bilateral    Sleep apnea 2016   Type 2 diabetes mellitus (HCC)     Past Surgical History:  Procedure Laterality Date   ANKLE SURGERY Right 1982   "had extra bones in there; took them out"   APPENDECTOMY  1975   BIOPSY  12/26/2018   Procedure: BIOPSY;  Surgeon: Malissa Hippo, MD;  [facility];  Service: Endoscopy;;  duodenal biopsies   BIOPSY  03/23/2019   Procedure: BIOPSY;  Surgeon: Malissa Hippo, MD;  [facility];  Service: Endoscopy;;  esophagus   CARDIAC CATHETERIZATION N/A 03/28/2015   Procedure: Left Heart Cath and Coronary Angiography;  Surgeon: Yates Decamp, MD;  Location: [facility] INVASIVE CV LAB;  Service: Cardiovascular;  Laterality: N/A;    CARDIAC CATHETERIZATION N/A 03/28/2015   Procedure: Intravascular Pressure Wire/FFR Study;  Surgeon: Yates Decamp, MD;  Location: [facility] INVASIVE CV LAB;  Service: Cardiovascular;  Laterality: N/A;   CARPAL TUNNEL RELEASE Left ~ 2008   COLONOSCOPY WITH ESOPHAGOGASTRODUODENOSCOPY (EGD)     COLONOSCOPY WITH PROPOFOL N/A 12/24/2019   Procedure: COLONOSCOPY WITH PROPOFOL;  Surgeon: Malissa Hippo, MD;  [facility];  Service: Endoscopy;  Laterality: N/A;  955   ELBOW FRACTURE SURGERY Left ~ 2008   ESOPHAGEAL MANOMETRY N/A 09/16/2021   Procedure: ESOPHAGEAL MANOMETRY (EM);  Surgeon: Shellia Cleverly, DO;  Location: [facility];  Service: Endoscopy;  Laterality: N/A;   ESOPHAGOGASTRODUODENOSCOPY N/A 12/26/2018   Procedure: ESOPHAGOGASTRODUODENOSCOPY (EGD);  Surgeon: Malissa Hippo, MD;  [facility];  Service: Endoscopy;  Laterality: N/A;   ESOPHAGOGASTRODUODENOSCOPY (EGD) WITH PROPOFOL N/A 03/23/2019   Procedure: ESOPHAGOGASTRODUODENOSCOPY (EGD) WITH PROPOFOL;  Surgeon: Malissa Hippo, MD;  [facility];  Service: Endoscopy;  Laterality: N/A;  7:30   FRACTURE SURGERY     ankle and elbow   PH IMPEDANCE STUDY N/A 09/16/2021   Procedure: PH IMPEDANCE STUDY;  Surgeon: Shellia Cleverly, DO;  Location: [facility];  Service: Endoscopy;  Laterality: N/A;   PILONIDAL CYST EXCISION N/A 03/24/2017   Procedure: EXCISION  CHRONIC  PILONIDAL ABSCESS;  Surgeon: [name] Miyamoto, MD;  Location: [facility];  Service: General;  Laterality: N/A;   SCROTAL EXPLORATION N/A 11/13/2020   Procedure: EXCISION OF SEBACEOUS CYSTS, SCROTUM;  Surgeon: Malen Gauze, MD;  Location: [facility];  Service: Urology;  Laterality: N/A;   TENDON REPAIR Left ~ 2004   "main tendon in my ankle"    Allergies  Allergen Reactions   Prilosec Otc [Omeprazole Magnesium] Swelling    Face swells, no breathing impairment   Bee Venom Swelling   Nexium [Esomeprazole] Swelling    Face swells, no breathing  impairment   Morphine And Codeine Nausea And Vomiting and Rash    Itching at IV site, dry heaving, burning sensation in throat.    Prior to Admission medications   Medication Sig Start Date End Date Taking? Authorizing Provider  doxycycline (VIBRAMYCIN) 100 MG capsule Take 1 capsule (100 mg total) by mouth 2 (two) times daily. 06/13/23  Yes Lunette Stands, PA-C  Accu-Chek FastClix Lancets MISC Check blood sugar 3 times daily 04/12/23   Shamleffer, Konrad Dolores, MD  acetaminophen (TYLENOL) 500 MG tablet Take 1,000 mg by mouth 2 (two) times daily as needed for moderate pain, fever or headache.    [provider]  albuterol (VENTOLIN HFA) 108 (90 Base) MCG/ACT inhaler Inhale 2 puffs into the lungs every 6 (six) hours as needed for wheezing or shortness of breath.    [provider]  ALPRAZolam Prudy Feeler) 0.5 MG tablet Take 0.5 mg by mouth at bedtime as needed. 03/11/23   [provider]  amLODipine (NORVASC) 5 MG tablet Take 5 mg by mouth daily. 02/07/23   [provider]  ARIPiprazole (ABILIFY) 10 MG tablet Take 10 mg by mouth daily.    [provider]  aspirin EC 81 MG tablet Take 1 tablet by mouth daily with breakfast. 02/14/21   [provider]  atorvastatin (LIPITOR) 80 MG tablet TAKE 1 Tablet BY MOUTH ONCE EVERY DAY Patient taking differently: Take 80 mg by mouth daily. 04/07/22   [name], PA-C  Blood Glucose Monitoring Suppl (ACCU-CHEK GUIDE) w/Device KIT Use to check blood sugar 3 times daily 04/12/23   Shamleffer, Konrad Dolores, MD  budesonide-formoterol (SYMBICORT) 80-4.5 MCG/ACT inhaler Take 2 puffs first thing in am and then another 2 puffs about 12 hours later. Patient taking differently: Inhale 2 puffs into the lungs 2 (two) times daily as needed (wheeze, shortness of breath). 06/11/22   Nyoka Cowden, MD  Continuous Glucose Sensor (FREESTYLE LIBRE 2 SENSOR) MISC Change sensors every 14 days 04/11/23   Shamleffer, Konrad Dolores, MD  Continuous Glucose Sensor (FREESTYLE LIBRE 3 PLUS SENSOR) MISC 1 Device by Other route every 14 (fourteen) days. Change sensor every 15 days. 04/11/23   Shamleffer, Konrad Dolores, MD  ezetimibe (ZETIA) 10 MG tablet Take 1 tablet (10 mg total) by mouth daily. 11/11/22   Dyann Kief, PA-C  famotidine (PEPCID) 40 MG tablet Take 1 tablet (40 mg total) by mouth 2 (two) times daily. Patient taking differently: Take 40 mg by mouth daily. 05/26/21   [name], PA-C  gabapentin (NEURONTIN) 300 MG capsule Take 1 capsule (300 mg total) by mouth every 8 (eight) hours as needed. Patient taking differently: Take 300 mg by mouth every 8 (eight) hours as needed (neuropathy). 10/22/21   Terald Sleeper, MD  glucose blood (ACCU-CHEK GUIDE) test strip Check blood sugar 3 times daily 04/12/23   Shamleffer, Konrad Dolores, MD  glucose  blood (TRUE METRIX BLOOD GLUCOSE TEST) test strip Check blood sugar 3 times daily 04/11/23   Shamleffer, Konrad Dolores, MD  insulin aspart (NOVOLOG FLEXPEN) 100 UNIT/ML FlexPen Start CF: NovoLog (BG-130/45) TIDQAC  Max daily 30 units 04/12/23   Shamleffer, Konrad Dolores, MD  insulin glargine (LANTUS SOLOSTAR) 100 UNIT/ML Solostar Pen Inject 14 Units into the skin daily. 04/11/23   Shamleffer, Konrad Dolores, MD  Insulin Pen Needle 32G X 4 MM MISC 1 Device by Does not apply route in the morning, at noon, in the evening, and at bedtime. 04/11/23   Shamleffer, Konrad Dolores, MD  linaclotide [facility]) 145 MCG CAPS capsule Take 1 capsule (145 mcg total) by mouth daily before breakfast. 06/21/22 06/16/23  Cirigliano, Vito V, DO  metFORMIN (GLUCOPHAGE-XR) 500 MG 24 hr tablet Take 1 tablet (500 mg total) by mouth 2 (two) times daily with a meal. 04/11/23   Shamleffer, Konrad Dolores, MD  midodrine (PROAMATINE) 10 MG tablet Take 1 tablet (10 mg total) by mouth 3 (three) times daily. 08/04/22   Creig Hines, NP  nitroGLYCERIN (NITROSTAT) 0.4 MG SL tablet  Place 1 tablet (0.4 mg total) under the tongue every 5 (five) minutes as needed for chest pain. 10/05/17   [name], PA-C  polyethylene glycol (MIRALAX / GLYCOLAX) 17 g packet Take 17 g by mouth daily. Patient taking differently: Take 17 g by mouth daily as needed for moderate constipation. 11/11/20   Gilda Crease, MD  prochlorperazine (COMPAZINE) 10 MG tablet Take 1 tablet (10 mg total) by mouth every 6 (six) hours as needed for nausea or vomiting. 07/07/22   Dione Booze, MD  promethazine (PHENERGAN) 25 MG tablet Take 25 mg by mouth daily as needed for nausea or vomiting.    [provider]  sertraline (ZOLOFT) 100 MG tablet Take 2 tablets (200 mg total) by mouth at bedtime. 08/30/22   Lanae Boast, MD  traZODone (DESYREL) 50 MG tablet Take 50 mg by mouth at bedtime. 10/03/20   [provider]    Social History   Socioeconomic History   Marital status: Single    Spouse name: Not on file   Number of children: 0   Years of education: 10th g   Highest education level: Not on file  Occupational History   Occupation: [employer]  Tobacco Use   Smoking status: Every Day    Current packs/day: 1.00    Average packs/day: 1 pack/day for 34.0 years (34.0 ttl pk-yrs)    Types: Cigarettes    Passive exposure: Never   Smokeless tobacco: Never   Tobacco comments:    1/2 pack a day  Vaping Use   Vaping status: Never Used  Substance and Sexual Activity   Alcohol use: Never    Alcohol/week: 0.0 standard drinks of alcohol   Drug use: Never   Sexual activity: Not Currently  Other Topics Concern   Not on file  Social History Narrative   His main caretaker, his mother has terminal metastatic cancer.   Social Drivers of Health   Financial Resource Strain: High Risk (06/24/2022)   Overall Financial Resource Strain (CARDIA)    Difficulty of Paying Living Expenses: Very hard  Food Insecurity: Food Insecurity Present (06/18/2022)   Hunger Vital Sign    Worried  About Running Out of Food in the Last Year: Often true    Ran Out of Food in the Last Year: Often true  Transportation Needs: No Transportation Needs (06/18/2022)   PRAPARE -  Administrator, Civil Service (Medical): No    Lack of Transportation (Non-Medical): No  Physical Activity: Not on file  Stress: Not on file  Social Connections: Unknown (05/25/2022)   Received from [facility], [facility]   Social Network    Social Network: Not on file  Intimate Partner Violence: Not At Risk (06/18/2022)   Humiliation, Afraid, Rape, and Kick questionnaire    Fear of Current or Ex-Partner: No    Emotionally Abused: No    Physically Abused: No    Sexually Abused: No     Family History  Problem Relation Age of Onset   Diabetes Mother    Hypertension Mother    Cancer Mother    Throat cancer Mother    Diabetes Father    Hypertension Father    Hyperlipidemia Father    Congestive Heart Failure Sister    Colon cancer Neg Hx    Pancreatic cancer Neg Hx    Liver disease Neg Hx     ROS: [x]  Positive   [ ]  Negative   [ ]  All sytems reviewed and are negative  Cardiovascular: []  chest pain/pressure []  palpitations []  SOB lying flat []  DOE []  pain in legs while walking []  pain in legs at rest []  pain in legs at night []  non-healing ulcers []  hx of DVT []  swelling in legs  Pulmonary: []  productive cough []  asthma/wheezing []  home O2  Neurologic: []  weakness in []  arms []  legs []  numbness in []  arms []  legs []  hx of CVA []  mini stroke [] difficulty speaking or slurred speech []  temporary loss of vision in one eye []  dizziness  Hematologic: []  hx of cancer []  bleeding problems []  problems with blood clotting easily  Endocrine:   []  diabetes []  thyroid disease  GI []  vomiting blood []  blood in stool  GU: []  CKD/renal failure []  HD--[]  M/W/F or []  T/T/S []  burning with urination []  blood in urine  Psychiatric: []  anxiety []   depression  Musculoskeletal: []  arthritis []  joint pain  Integumentary: []  rashes []  ulcers  Constitutional: []  fever []  chills   Physical Examination  Vitals:   06/13/23 1404 06/13/23 1830  BP: 134/86 (!) 149/87  Pulse: 99 93  Resp: 16 18  Temp: 97.9 F (36.6 C) 98.9 F (37.2 C)  SpO2: 100% 100%   Body mass index is 24.81 kg/m.  General:  WDWN in NAD Gait: Not observed HENT: WNL, normocephalic Pulmonary: normal non-labored breathing Cardiac: regular, without  Murmurs, rubs or gallops Abdomen:  soft, NT/ND Vascular Exam/Pulses: Bilateral common femoral pulses palpable No palpable pedal pulses Left leg tissue loss as pictured on the dorsum and left 5th toe Musculoskeletal: no muscle wasting or atrophy  Neurologic: A&O X 3; Appropriate Affect ; SENSATION: normal; MOTOR FUNCTION:  moving all extremities equally. Speech is fluent/normal       CBC    Component Value Date/Time   WBC 13.2 (H) 06/13/2023 1805   RBC 4.59 06/13/2023 1805   HGB 14.2 06/13/2023 1805   HCT 43.5 06/13/2023 1805   PLT 251 06/13/2023 1805   MCV 94.8 06/13/2023 1805   MCH 30.9 06/13/2023 1805   MCHC 32.6 06/13/2023 1805   RDW 13.0 06/13/2023 1805   LYMPHSABS 2.4 06/13/2023 1805   MONOABS 0.7 06/13/2023 1805   EOSABS 0.3 06/13/2023 1805   BASOSABS 0.1 06/13/2023 1805    BMET    Component Value Date/Time   NA 139 06/13/2023 1805   K 4.4 06/13/2023 1805  CL 106 06/13/2023 1805   CO2 24 06/13/2023 1805   GLUCOSE 106 (H) 06/13/2023 1805   BUN 21 (H) 06/13/2023 1805   CREATININE 1.32 (H) 06/13/2023 1805   CREATININE 1.05 01/23/2019 0927   CALCIUM 8.7 (L) 06/13/2023 1805   GFRNONAA >60 06/13/2023 1805   GFRNONAA 83 01/23/2019 0927   GFRAA >60 01/11/2020 0859   GFRAA 96 01/23/2019 0927    COAGS: Lab Results  Component Value Date   INR 1.0 01/09/2022   INR 1.0 11/29/2021   INR 1.0 09/03/2020     Non-Invasive Vascular Imaging:    ABIs from the wound center were  reportedly 0.3 although I do not have these records  ASSESSMENT/PLAN: This is a 53 y.o. male  with history of tobacco abuse, diabetes, hypertension, hyperlipidemia, COPD that vascular surgery was consulted for left foot wound with abnormal ABIs.  Reportedly has had a wound on the top of his left foot since September 2024.  Now over the last week has developed a left fifth toe dry gangrene. Was seen in the wound clinic today and reportedly had an ABI of 0.3.  I have offered him transfer to [facility] for angiogram tomorrow - currently in [place] ED.  Patient does not want transfer to [facility] and states he would like to be discharged.  He would like to arrange to have this procedure electively when he has family present.  He is tearful in the ED.  I discussed he is at risk of limb loss for any delay of intervention.  Ultimately after discussing options we will plan discharge on oral antibiotics.  Gangrene on his toe is dry at this time.  I will arrange elective angiogram in the next week.  Risk benefits discussed and discussed transfemoral access to evaluate left lower extremity runoff and if no endovascular options may require surgical bypass.  All questions answered.  My office will contact patient to arrange.  Discussed if he has any worsening symptoms to come back to the ED.  Cephus Shelling, MD [facility] Office: [phone]  Cephus Shelling

## 2023-06-13 NOTE — Discharge Instructions (Addendum)
 You were seen today for gangrene of left foot fifth toe.  You are also seen by vascular surgery today.  They have scheduled an appointment with you.  Be sure to call their office to confirm appointment.  I have also prescribed you doxycycline .  This is the antibiotic, this may cause stomach upset.  To help prevent this be sure to take this with food.  To stop smoking, contact 1 800 QUIT NOW.  They will be able to give you free resources as well as over-the-counter medications for treatment.  Ensure that you follow-up with vascular surgery.  If you begin to have fever, chest pain, shortness of breath, worsening limb pain, cold leg, or new or worsening symptoms to be sure to return to the ER for evaluation.

## 2023-06-13 NOTE — ED Notes (Signed)
 Refusing labs

## 2023-06-13 NOTE — ED Provider Triage Note (Signed)
 Emergency Medicine Provider Triage Evaluation Note  CHANG TIGGS , a 53 y.o. male  was evaluated in triage.  Pt complains of complaint of2 days ago noticed new black spot on Left  5th toe. Referred by wound clinic today.  Has had a previous wound on foot since 01/2023. Previous Hx of DM. Able to ambulate but has to stop due to dyspnea and leg pain, chronic COPD. Pulses present on doppler by wound clinic, and confirmed. No foot drop. Foot is warm to the touch and erythematous.   Review of Systems  Positive: Bilateral numbness, tingling, LE coldness, increased erythema/ warmth to L leg.  Negative: Fevers, chest pain, n/v  Physical Exam  BP 134/86 (BP Location: Left Arm)   Pulse 99   Temp 97.9 F (36.6 C) (Oral)   Resp 16   Ht 5' 8 (1.727 m)   Wt 74 kg   SpO2 100%   BMI 24.81 kg/m  Gen:   Awake, no distress   Resp:  Normal effort  MSK:   Moves extremities without difficulty  Other:    Medical Decision Making  Medically screening exam initiated at 2:26 PM.  Appropriate orders placed.  Syncere L Quinter was informed that the remainder of the evaluation will be completed by another provider, this initial triage assessment does not replace that evaluation, and the importance of remaining in the ED until their evaluation is complete.     Beola Terrall RAMAN, [place] 06/13/23 1444

## 2023-06-13 NOTE — ED Triage Notes (Signed)
 Patient referred by wound clinic for necrotic left 5th toe x2 days.  Erythremia and warmth noted to left leg.  Coolness noted to toe and pulses noted on doppler.   Hx DM

## 2023-06-13 NOTE — ED Provider Notes (Signed)
 [place] EMERGENCY DEPARTMENT AT [facility] Provider Note   CSN: [id number] Arrival date & time: 06/13/23  1359     History  Chief Complaint  Patient presents with   Foot Pain    [name] [name] is a 53 y.o. male.   Foot Pain  Patient returns the ED today for a new black lesion on his fifth toe on the left foot.  Previous medical history of type 2 diabetes, diabetic polyneuropathy, COPD, was seen by wound clinic today for a chronic wound to the left foot since September 2024 but was sent over by them due to edema, erythema, warmth, new black lesion.  Pulses were noted on Doppler by wound clinic case but it looks like and also confirmed here in the ER.  Denies fever, chest pain, nausea, vomiting     Home Medications Prior to Admission medications   Medication Sig Start Date End Date Taking? Authorizing Provider  doxycycline  (VIBRAMYCIN ) 100 MG capsule Take 1 capsule (100 mg total) by mouth 2 (two) times daily. 06/13/23  Yes Beola Terrall RAMAN, PA-C  Accu-Chek FastClix Lancets MISC Check blood sugar 3 times daily 04/12/23   Shamleffer, Ibtehal Jaralla, MD  acetaminophen  (TYLENOL ) 500 MG tablet Take 1,000 mg by mouth 2 (two) times daily as needed for moderate pain, fever or headache.    [provider]  albuterol  (VENTOLIN  HFA) 108 (90 Base) MCG/ACT inhaler Inhale 2 puffs into the lungs every 6 (six) hours as needed for wheezing or shortness of breath.    [provider]  ALPRAZolam  (XANAX ) 0.5 MG tablet Take 0.5 mg by mouth at bedtime as needed. 03/11/23   [provider]  amLODipine  (NORVASC ) 5 MG tablet Take 5 mg by mouth daily. 02/07/23   [provider]  ARIPiprazole  (ABILIFY ) 10 MG tablet Take 10 mg by mouth daily.    [provider]  aspirin  EC 81 MG tablet Take 1 tablet by mouth daily with breakfast. 02/14/21   [provider]  atorvastatin  (LIPITOR ) 80 MG tablet TAKE 1 Tablet BY MOUTH ONCE EVERY DAY Patient  taking differently: Take 80 mg by mouth daily. 04/07/22   Comer Kirsch, PA-C  Blood Glucose Monitoring Suppl (ACCU-CHEK GUIDE) w/Device KIT Use to check blood sugar 3 times daily 04/12/23   Shamleffer, Ibtehal Jaralla, MD  budesonide -formoterol  (SYMBICORT ) 80-4.5 MCG/ACT inhaler Take 2 puffs first thing in am and then another 2 puffs about 12 hours later. Patient taking differently: Inhale 2 puffs into the lungs 2 (two) times daily as needed (wheeze, shortness of breath). 06/11/22   Darlean Ozell NOVAK, MD  Continuous Glucose Sensor (FREESTYLE LIBRE 2 SENSOR) MISC Change sensors every 14 days 04/11/23   Shamleffer, Ibtehal Jaralla, MD  Continuous Glucose Sensor (FREESTYLE LIBRE 3 PLUS SENSOR) MISC 1 Device by Other route every 14 (fourteen) days. Change sensor every 15 days. 04/11/23   Shamleffer, Ibtehal Jaralla, MD  ezetimibe  (ZETIA ) 10 MG tablet Take 1 tablet (10 mg total) by mouth daily. 11/11/22   Parthenia Olivia HERO, PA-C  famotidine  (PEPCID ) 40 MG tablet Take 1 tablet (40 mg total) by mouth 2 (two) times daily. Patient taking differently: Take 40 mg by mouth daily. 05/26/21   Comer Kirsch, PA-C  gabapentin  (NEURONTIN ) 300 MG capsule Take 1 capsule (300 mg total) by mouth every 8 (eight) hours as needed. Patient taking differently: Take 300 mg by mouth every 8 (eight) hours as needed (neuropathy). 10/22/21   Cottie Donnice PARAS, MD  glucose blood (ACCU-CHEK GUIDE)  test strip Check blood sugar 3 times daily 04/12/23   Shamleffer, Ibtehal Jaralla, MD  glucose blood (TRUE METRIX BLOOD GLUCOSE TEST) test strip Check blood sugar 3 times daily 04/11/23   Shamleffer, Ibtehal Jaralla, MD  insulin  aspart (NOVOLOG  FLEXPEN) 100 UNIT/ML FlexPen Start CF: NovoLog  (BG-130/45) TIDQAC  Max daily 30 units 04/12/23   Shamleffer, Ibtehal Jaralla, MD  insulin  glargine (LANTUS  SOLOSTAR) 100 UNIT/ML Solostar Pen Inject 14 Units into the skin daily. 04/11/23   Shamleffer, Ibtehal Jaralla, MD  Insulin  Pen Needle 32G X 4 MM  MISC 1 Device by Does not apply route in the morning, at noon, in the evening, and at bedtime. 04/11/23   Shamleffer, Ibtehal Jaralla, MD  linaclotide  (LINZESS ) 145 MCG CAPS capsule Take 1 capsule (145 mcg total) by mouth daily before breakfast. 06/21/22 06/16/23  Cirigliano, Vito V, DO  metFORMIN  (GLUCOPHAGE -XR) 500 MG 24 hr tablet Take 1 tablet (500 mg total) by mouth 2 (two) times daily with a meal. 04/11/23   Shamleffer, Donell Cardinal, MD  midodrine  (PROAMATINE ) 10 MG tablet Take 1 tablet (10 mg total) by mouth 3 (three) times daily. 08/04/22   Vivienne Lonni Ingle, NP  nitroGLYCERIN  (NITROSTAT ) 0.4 MG SL tablet Place 1 tablet (0.4 mg total) under the tongue every 5 (five) minutes as needed for chest pain. 10/05/17   Comer Kirsch, PA-C  polyethylene glycol (MIRALAX  / GLYCOLAX ) 17 g packet Take 17 g by mouth daily. Patient taking differently: Take 17 g by mouth daily as needed for moderate constipation. 11/11/20   Haze Lonni PARAS, MD  prochlorperazine  (COMPAZINE ) 10 MG tablet Take 1 tablet (10 mg total) by mouth every 6 (six) hours as needed for nausea or vomiting. 07/07/22   Raford Lenis, MD  promethazine  (PHENERGAN ) 25 MG tablet Take 25 mg by mouth daily as needed for nausea or vomiting.    [provider]  sertraline  (ZOLOFT ) 100 MG tablet Take 2 tablets (200 mg total) by mouth at bedtime. 08/30/22   Christobal Guadalajara, MD  traZODone (DESYREL) 50 MG tablet Take 50 mg by mouth at bedtime. 10/03/20   [provider]      Allergies    Prilosec otc [omeprazole magnesium ], Bee venom, Nexium [esomeprazole], and Morphine  and codeine    Review of Systems   Review of Systems  Musculoskeletal:  Positive for myalgias.  Skin:  Positive for color change and wound.  All other systems reviewed and are negative.   Physical Exam Updated Vital Signs BP (!) 149/87 (BP Location: Left Arm)   Pulse 93   Temp 98.9 F (37.2 C) (Oral)   Resp 18   Ht 5' 8 (1.727 m)   Wt 74 kg   SpO2 100%    BMI 24.81 kg/m  Physical Exam Vitals and nursing note reviewed.  Constitutional:      Appearance: Normal appearance.  HENT:     Head: Normocephalic and atraumatic.  Eyes:     Extraocular Movements: Extraocular movements intact.     Conjunctiva/sclera: Conjunctivae normal.  Cardiovascular:     Rate and Rhythm: Normal rate and regular rhythm.     Pulses: Normal pulses.     Heart sounds: Normal heart sounds.  Pulmonary:     Effort: Pulmonary effort is normal. No respiratory distress.     Breath sounds: Normal breath sounds.  Abdominal:     General: Abdomen is flat.     Palpations: Abdomen is soft.     Tenderness: There is no abdominal tenderness.  Musculoskeletal:  General: Swelling and tenderness present.     Left lower leg: Edema present.  Skin:    General: Skin is warm and dry.     Findings: Lesion present.  Neurological:     General: No focal deficit present.     Mental Status: He is alert. Mental status is at baseline.  Psychiatric:        Mood and Affect: Mood normal.             ED Results / Procedures / Treatments   Labs (all labs ordered are listed, but only abnormal results are displayed) Labs Reviewed  CBC WITH DIFFERENTIAL/PLATELET - Abnormal; Notable for the following components:      Result Value   WBC 13.2 (*)    Neutro Abs 9.7 (*)    All other components within normal limits  BASIC METABOLIC PANEL - Abnormal; Notable for the following components:   Glucose, Bld 106 (*)    BUN 21 (*)    Creatinine, Ser 1.32 (*)    Calcium  8.7 (*)    All other components within normal limits  CULTURE, BLOOD (ROUTINE X 2)  CULTURE, BLOOD (ROUTINE X 2)  I-STAT CG4 LACTIC ACID, ED    EKG None  Radiology DG Foot 2 Views Left Result Date: 06/13/2023 CLINICAL DATA:  Foot pain in the presence of gangrene. EXAM: LEFT FOOT - 2 VIEW COMPARISON:  Left ankle radiographs 05/21/2024 FINDINGS: Mild chronic enthesopathic change at the Achilles insertion on the  calcaneus. There is irregularity and lucency within the dorsal midfoot, just dorsal to the proximal metatarsals, best seen on lateral view. No definite cortical erosion is seen within the adjacent bones. No acute fracture or dislocation. Mild atherosclerotic calcifications. Mild diffuse ankle and foot soft tissue swelling. IMPRESSION: 1. Irregularity and lucency within the dorsal midfoot, just dorsal to the proximal metatarsals, best seen on lateral view. This may represent the site of reported gangrene. No definite cortical erosion is seen within the adjacent bones. 2. Mild diffuse ankle and foot soft tissue swelling. Electronically Signed   By: Tanda Lyons M.D.   On: 06/13/2023 16:33    Procedures Procedures    Medications Ordered in ED Medications  iohexol  (OMNIPAQUE ) 350 MG/ML injection 100 mL (has no administration in time range)    ED Course/ Medical Decision Making/ A&P Clinical Course as of 06/13/23 2133  Mon Jun 13, 2023  8171 This is a 53 year old male with a history of diabetes, peripheral arterial disease, significant neuropathy, presenting to ED with concern for discolored left fifth toe.  Patient reports he checks his legs on a nightly basis and noted 2 nights ago that there was sudden black discoloration of his left fifth toe.  He does not have sensation in his feet, but does report he has had paresthesia burning pain chronically for years, involving the top of his feet and his lower legs.  He is on gabapentin  for this.  He also has an ulceration on the top of his left foot which is chronic, and for which she says he completed a course of antibiotics 2 days ago, although he cannot recall the name of it.  He went to see his wound care specialist today, who was concerned about potential ischemia of the toe.  He provided a written note from the specialist which I reviewed, noting an ABI of 0.33.  On exam the patient is afebrile, vital signs are unremarkable.  White blood cell count is  elevated 13.2.  Lactate is  within normal limits.  He does have a necrotic appearing base to his distal left fifth toe.  There is also weeping ulceration to the top or dorsum of his left foot.  He has audible tibial pulse, I am not able to pick up an audible Doppler pedal pulse but the patient has brisk cap refills in his remaining toes and the foot feels warm.  I do not believe he has an ischemic foot.  I do suspect that this may be a thromboembolic event given the rapidity of the symptom onset.  We will discuss the case with the vascular surgeon regarding potential anticoagulation or imaging, and I would anticipate admission for possible toe amputation. [MT]  1857 Spoke to vascular surgery, they said that he is not needing any anticoagulation or CT runoff at this time.  Says that he should be moved over to [facility] where they will see him in the morning. [CB]    Clinical Course User Index [CB] Yaneliz Radebaugh S, PA-C [MT] Cottie Donnice PARAS, MD   {    Medical Decision Making Amount and/or Complexity of Data Reviewed Labs: ordered. Radiology: ordered.  Risk Prescription drug management.   This patient is a 53 year old male who presents to the ED for concern of arterial insufficiency and left gangrenous fifth toe.   Differential diagnoses prior to evaluation: The emergent differential diagnosis includes, but is not limited to, dry gangrene versus wet gangrene, ischemic limb, DVT, PAD. This is not an exhaustive differential.   Past Medical History / Co-morbidities / Social History: type 2 diabetes, diabetic polyneuropathy, COPD,  Additional history: Chart reviewed. Pertinent results include: Following cardiology for coronary artery disease.  Had seen the wound clinic today.  Still has an ABI of 0.33 according to clinic.    Lab Tests/Imaging studies: I personally interpreted labs/imaging and the pertinent results include:   Blood cultures pending lactic acid 1.2 BMP baseline for  patient  CBC noted for leukocytosis X-ray of foot shows irregularity and lucency within the dorsal midfoot, just dorsal to the proximal metatarsals, best seen on lateral view. This may represent the site of reported gangrene. No definite cortical erosion is seen within the adjacent bones. Mild diffuse ankle and foot soft tissue swelling. I agree with the radiologist interpretation.   Medications: I ordered medication including doxycycline .  I have reviewed the patients home medicines and have made adjustments as needed.   ED Course:  Patient is a 53 year old male who presents to the ER today with concern of a left gangrenous fifth toe.  This had an acute onset of 2 days.  Was seen by wound clinic today while he was being followed for a chronic nonhealing wounds in September.  They had done ABIs on him and was shown to be 0.33.  They were concerned about ischemic limb and sent him to the ER.  Patient does have previous medical history of arterial sufficiency and nonhealing wounds.  Physical exam was remarkable for a dry gangrenous left fifth toe.  DP and PT pulses were not palpable.  However they were able to be found by Doppler ultrasound.  Consulted vascular surgery, they agreed to come and see the patient.  Dr. Gretta then evaluated the patient and said that he could follow-up with him outpatient did not need to be admitted.  Also told to send him home with some antibiotics.  No imaging needed at this time due to palpable femoral pulse.  Labs are within patient's baseline outside of an elevated  leukocytosis on CBC.  Blood cultures are still pending at this time.  Patient vital signs been stable throughout his stay here.  I believe patient is safe to be discharged and follow-up outpatient with vascular surgery.  Patient expressed agreement understanding of plan.  Disposition: After consideration of the diagnostic results and the patients response to treatment, I feel that discharge and treatment as  above.   emergency department workup does not suggest an emergent condition requiring admission or immediate intervention beyond what has been performed at this time. The plan is: Doxycycline , follow-up with vascular surgery, return for new or worsening symptoms, wound care. The patient is safe for discharge and has been instructed to return immediately for worsening symptoms, change in symptoms or any other concerns.    Final Clinical Impression(s) / ED Diagnoses Final diagnoses:  Gangrene of toe (HCC)  Arterial insufficiency (HCC)    Rx / DC Orders ED Discharge Orders          Ordered    doxycycline  (VIBRAMYCIN ) 100 MG capsule  2 times daily        06/13/23 2022              Beola Terrall GORMAN DEVONNA 06/13/23 2133    Cottie Donnice PARAS, MD 06/14/23 [phone]

## 2023-06-15 ENCOUNTER — Other Ambulatory Visit: Payer: Self-pay

## 2023-06-15 DIAGNOSIS — I70222 Atherosclerosis of native arteries of extremities with rest pain, left leg: Secondary | ICD-10-CM

## 2023-06-15 NOTE — Progress Notes (Signed)
 Purdy, Alecsander L ([id number]) [phone] [id number] Page 1 of 4 Visit Report for 06/13/2023 Abuse Risk Screen Details Patient Name: Date of Service: PA LUELLA LITTIE ARLINE ROLAND FREDRIK 06/13/2023 12:45 PM Medical Record Number: [id number] Patient Account Number: [id number] Date of Birth/Sex: Treating RN: 08-Feb-1971 (53 y.o. NETTY Drury Nestle Primary Care Chez Bulnes: PA SANDRA, NO Other Clinician: Referring Akili Cuda: Treating Darcee Dekker/Extender: Rosan Harlene Duos in Treatment: 0 Abuse Risk Screen Items Answer ABUSE RISK SCREEN: Has anyone close to you tried to hurt or harm you recentlyo No Do you feel uncomfortable with anyone in your familyo No Has anyone forced you do things that you didnt want to doo No Electronic Signature(s) Signed: 06/13/2023 4:48:15 PM By: Drury Nestle RN, BSN Entered By: Drury Nestle on 06/13/2023 12:54:07 -------------------------------------------------------------------------------- Activities of Daily Living Details Patient Name: Date of Service: PA [name], DA NNY L. 06/13/2023 12:45 PM Medical Record Number: [id number] Patient Account Number: [id number] Date of Birth/Sex: Treating RN: 09/12/1970 (53 y.o. NETTY Drury Nestle Primary Care Cheresa Siers: PA SANDRA, NO Other Clinician: Referring Yachet Mattson: Treating Yaremi Stahlman/Extender: Rosan Harlene Duos in Treatment: 0 Activities of Daily Living Items Answer Activities of Daily Living (Please select one for each item) Drive Automobile Completely Able T Medications ake Completely Able Use T elephone Completely Able Care for Appearance Completely Able Use T oilet Completely Able Bath / Shower Completely Able Dress Self Completely Able Feed Self Completely Able Walk Completely Able Get In / Out Bed Completely Able Housework Completely Able Prepare Meals Completely Able Handle Money Completely Able Shop for Self Completely Able Electronic Signature(s) Signed: 06/13/2023 4:48:15 PM By: Drury Nestle  RN, BSN Entered By: Drury Nestle on 06/13/2023 12:54:22 Chichester, SALOMON LITTIE ([id number]) [id number] Page 2 of 4 -------------------------------------------------------------------------------- Education Screening Details Patient Name: Date of Service: PA [name], [place] NNY L. 06/13/2023 12:45 PM Medical Record Number: [id number] Patient Account Number: [id number] Date of Birth/Sex: Treating RN: [birth year] (53 y.o. NETTY Drury Nestle Primary Care Raelan Burgoon: PA SANDRA, NO Other Clinician: Referring Armanda Forand: Treating Devaeh Amadi/Extender: Rosan Harlene Duos in Treatment: 0 Primary Learner Assessed: Patient Learning Preferences/Education Level/Primary Language Learning Preference: Explanation, Demonstration, Printed Material Highest Education Level: Grade School Preferred Language: English Cognitive Barrier Language Barrier: No Memory Deficit: No Emotional Barrier: No Cultural/Religious Beliefs Affecting Medical Care: No Physical Barrier Impaired Vision: Yes Glasses Impaired Hearing: No Decreased Hand dexterity: No Knowledge/Comprehension Knowledge Level: High Comprehension Level: High Ability to understand written instructions: High Ability to understand verbal instructions: High Motivation Anxiety Level: Calm Cooperation: Cooperative Education Importance: Acknowledges Need Interest in Health Problems: Asks Questions Perception: Coherent Willingness to Engage in Self-Management High Activities: Readiness to Engage in Self-Management High Activities: Electronic Signature(s) Signed: 06/13/2023 4:48:15 PM By: Drury Nestle RN, BSN Entered By: Drury Nestle on 06/13/2023 12:54:40 -------------------------------------------------------------------------------- Fall Risk Assessment Details Patient Name: Date of Service: PA [name], DA NNY L. 06/13/2023 12:45 PM Medical Record Number: [id number] Patient Account Number: [id number] Date of Birth/Sex: Treating  RN: 09/17/1970 (53 y.o. NETTY Drury Nestle Primary Care Danashia Landers: PA TIENT, NO Other Clinician: Referring Conley Pawling: Treating Jamarea Selner/Extender: Rosan Harlene Duos in Treatment: 0 Fall Risk Assessment Items Have you had 2 or more falls in the last 12 monthso 0 No Have you had any fall that resulted in injury in the last 12 monthso 0 Yes Wadas, Kairee L ([id number]) [id number] Page 3 of 4 FALLS RISK SCREEN History of falling - immediate or within 3 months 0 No Secondary diagnosis (Do you have 2 or more medical diagnoseso) 0 No Ambulatory  aid None/bed rest/wheelchair/nurse 0 Yes Crutches/cane/walker 0 No Furniture 0 No Intravenous therapy Access/Saline/Heparin  Lock 0 No Gait/Transferring Normal/ bed rest/ wheelchair 0 Yes Weak (short steps with or without shuffle, stooped but able to lift head while walking, may seek 0 No support from furniture) Impaired (short steps with shuffle, may have difficulty arising from chair, head down, impaired 0 No balance) Mental Status Oriented to own ability 0 Yes Electronic Signature(s) Signed: 06/13/2023 4:48:15 PM By: Drury Nestle RN, BSN Entered By: Drury Nestle on 06/13/2023 12:54:49 -------------------------------------------------------------------------------- Foot Assessment Details Patient Name: Date of Service: PA [name], DA NNY L. 06/13/2023 12:45 PM Medical Record Number: [id number] Patient Account Number: [id number] Date of Birth/Sex: Treating RN: 02/14/1971 (53 y.o. NETTY Drury Nestle Primary Care Josilynn Losh: PA TIENT, NO Other Clinician: Referring Kamalani Mastro: Treating Jesse Nosbisch/Extender: Rosan Harlene Duos in Treatment: 0 Foot Assessment Items Site Locations + = Sensation present, - = Sensation absent, C = Callus, U = Ulcer R = Redness, W = Warmth, M = Maceration, PU = Pre-ulcerative lesion F = Fissure, S = Swelling, D = Dryness Assessment Right: Left: Other Deformity: No No Prior Foot Ulcer:  No No Prior Amputation: No No Charcot Joint: No No Ambulatory Status: Ambulatory Without Help GaitSidi Dzikowski, Ever L ([id number]) [id number] Page 4 of 4 Electronic Signature(s) Signed: 06/13/2023 4:48:15 PM By: Drury Nestle RN, BSN Entered By: Drury Nestle on 06/13/2023 13:00:21 -------------------------------------------------------------------------------- Nutrition Risk Screening Details Patient Name: Date of Service: PA [name], DA NNY L. 06/13/2023 12:45 PM Medical Record Number: [id number] Patient Account Number: [id number] Date of Birth/Sex: Treating RN: [birth year] (53 y.o. NETTY Drury Nestle Primary Care Byanca Kasper: PA TIENT, NO Other Clinician: Referring Alzada Brazee: Treating Husna Krone/Extender: Rosan Harlene Duos in Treatment: 0 Height (in): 68 Weight (lbs): 164 Body Mass Index (BMI): 24.9 Nutrition Risk Screening Items Score Screening NUTRITION RISK SCREEN: I have an illness or condition that made me change the kind and/or amount of food I eat 2 Yes I eat fewer than two meals per day 0 No I eat few fruits and vegetables, or milk products 0 No I have three or more drinks of beer, liquor or wine almost every day 0 No I have tooth or mouth problems that make it hard for me to eat 0 No I don't always have enough money to buy the food I need 0 No I eat alone most of the time 0 No I take three or more different prescribed or over-the-counter drugs a day 1 Yes Without wanting to, I have lost or gained 10 pounds in the last six months 0 No I am not always physically able to shop, cook and/or feed myself 0 No Nutrition Protocols Good Risk Protocol Provide education on elevated blood Moderate Risk Protocol 0 sugars and impact on wound healing, as applicable High Risk Proctocol Risk Level: Moderate Risk Score: 3 Electronic Signature(s) Signed: 06/13/2023 4:48:15 PM By: Drury Nestle RN, BSN Entered By: Drury Nestle on 06/13/2023  12:54:58

## 2023-06-15 NOTE — Progress Notes (Addendum)
[name] [name], [name] [name] ([id number]) [id number] Page 1 of 10 Visit Report for 06/13/2023 Allergy List Details Patient Name: Date of Service: [name] 06/13/2023 12:45 PM Medical Record Number: [id number] Patient Account Number: [id number] Date of Birth/Sex: Treating RN: 07-28-1970 (53 y.o. [name] [name] Primary Care Tamarick Kovalcik: PA Zenovia Jordan, NO Other Clinician: Referring Hoang Pettingill: Treating Breyson Kelm/Extender: Tilda Franco in Treatment: 0 Allergies Active Allergies Prilosec Severity: Moderate bee venom protein (honey bee) Severity: Severe Nexium Severity: Severe morphine Severity: Severe Allergy Notes Electronic Signature(s) Signed: 06/13/2023 4:48:15 PM By: Shawn Stall RN, BSN Entered By: Shawn Stall on 06/13/2023 12:52:29 -------------------------------------------------------------------------------- Arrival Information Details Patient Name: Date of Service: PA [name], DA NNY L. 06/13/2023 12:45 PM Medical Record Number: [id number] Patient Account Number: [id number] Date of Birth/Sex: Treating RN: May 03, 1971 (53 y.o. [name] [name] Primary Care Mohamadou Maciver: PA Zenovia Jordan, NO Other Clinician: Referring Zianna Dercole: Treating Johnnathan Hagemeister/Extender: Tilda Franco in Treatment: 0 Visit Information Patient Arrived: Ambulatory Arrival Time: 12:45 Accompanied By: self Transfer Assistance: None Patient Identification Verified: Yes Secondary Verification Process Completed: Yes Patient Requires Transmission-Based Precautions: No Patient Has Alerts: No Electronic Signature(s) Signed: 06/13/2023 4:48:15 PM By: Shawn Stall RN, BSN Entered By: Shawn Stall on 06/13/2023 12:51:39 [name] [name] ([id number]) [id number] Page 2 of 10 -------------------------------------------------------------------------------- Clinic Level of Care Assessment Details Patient Name: Date of Service: [name] 06/13/2023 12:45  PM Medical Record Number: [id number] Patient Account Number: [id number] Date of Birth/Sex: Treating RN: [birth year] (53 y.o. [name] [name] Primary Care Antavion Bartoszek: PA Zenovia Jordan, NO Other Clinician: Referring Maleak Brazzel: Treating Josette Shimabukuro/Extender: Tilda Franco in Treatment: 0 Clinic Level of Care Assessment Items TOOL 2 Quantity Score X- 1 0 Use when only an EandM is performed on the INITIAL visit ASSESSMENTS - Nursing Assessment / Reassessment X- 1 20 General Physical Exam (combine w/ comprehensive assessment (listed just below) when performed on new pt. evals) X- 1 25 Comprehensive Assessment (HX, ROS, Risk Assessments, Wounds Hx, etc.) ASSESSMENTS - Wound and Skin A ssessment / Reassessment []  - 0 Simple Wound Assessment / Reassessment - one wound X- 2 5 Complex Wound Assessment / Reassessment - multiple wounds []  - 0 Dermatologic / Skin Assessment (not related to wound area) ASSESSMENTS - Ostomy and/or Continence Assessment and Care []  - 0 Incontinence Assessment and Management []  - 0 Ostomy Care Assessment and Management (repouching, etc.) PROCESS - Coordination of Care []  - 0 Simple Patient / Family Education for ongoing care X- 1 20 Complex (extensive) Patient / Family Education for ongoing care X- 1 10 Staff obtains Chiropractor, Records, T Results / Process Orders est []  - 0 Staff telephones HHA, Nursing Homes / Clarify orders / etc []  - 0 Routine Transfer to another Facility (non-emergent condition) []  - 0 Routine Hospital Admission (non-emergent condition) X- 1 15 New Admissions / Manufacturing engineer / Ordering NPWT Apligraf, etc. , X- 1 20 Emergency Hospital Admission (emergent condition) []  - 0 Simple Discharge Coordination X- 1 15 Complex (extensive) Discharge Coordination PROCESS - Special Needs []  - 0 Pediatric / Minor Patient Management []  - 0 Isolation Patient Management []  - 0 Hearing / Language / Visual special needs []  - 0 Assessment  of Community assistance (transportation, D/C planning, etc.) []  - 0 Additional assistance / Altered mentation []  - 0 Support Surface(s) Assessment (bed, cushion, seat, etc.) INTERVENTIONS - Wound Cleansing / Measurement X- 1 5 Wound Imaging (photographs - any number of wounds) []  - 0 Wound Tracing (instead of photographs) []  -  0 Simple Wound Measurement - one wound X- 2 5 Complex Wound Measurement - multiple wounds []  - 0 Simple Wound Cleansing - one wound [name], [name] ([id number]) [id number] Page 3 of 10 X- 2 5 Complex Wound Cleansing - multiple wounds INTERVENTIONS - Wound Dressings X - Small Wound Dressing one or multiple wounds 2 10 []  - 0 Medium Wound Dressing one or multiple wounds []  - 0 Large Wound Dressing one or multiple wounds []  - 0 Application of Medications - injection INTERVENTIONS - Miscellaneous []  - 0 External ear exam []  - 0 Specimen Collection (cultures, biopsies, blood, body fluids, etc.) []  - 0 Specimen(s) / Culture(s) sent or taken to Lab for analysis []  - 0 Patient Transfer (multiple staff / Michiel Sites Lift / Similar devices) []  - 0 Simple Staple / Suture removal (25 or less) []  - 0 Complex Staple / Suture removal (26 or more) []  - 0 Hypo / Hyperglycemic Management (close monitor of Blood Glucose) X- 1 15 Ankle / Brachial Index (ABI) - do not check if billed separately Has the patient been seen at the hospital within the last three years: Yes Total Score: 195 Level Of Care: New/Established - Level 5 Electronic Signature(s) Signed: 06/13/2023 4:48:15 PM By: Shawn Stall RN, BSN Entered By: Shawn Stall on 06/13/2023 13:35:41 -------------------------------------------------------------------------------- Encounter Discharge Information Details Patient Name: Date of Service: PA [name], DA NNY L. 06/13/2023 12:45 PM Medical Record Number: [id number] Patient Account Number: [id number] Date of Birth/Sex: Treating  RN: 08-15-1970 (53 y.o. [name] [name] Primary Care Cherrill Scrima: PA Zenovia Jordan, NO Other Clinician: Referring Netanya Yazdani: Treating Nashley Cordoba/Extender: Tilda Franco in Treatment: 0 Encounter Discharge Information Items Discharge Condition: Stable Ambulatory Status: Ambulatory Discharge Destination: Home Transportation: Private Auto Accompanied By: self Schedule Follow-up Appointment: Yes Clinical Summary of Care: Electronic Signature(s) Signed: 06/13/2023 4:48:15 PM By: Shawn Stall RN, BSN Entered By: Shawn Stall on 06/13/2023 13:36:07 -------------------------------------------------------------------------------- Lower Extremity Assessment Details Patient Name: Date of Service: PA [name], DA NNY L. 06/13/2023 12:45 PM Nieman, [name] [name] ([id number]) [id number] Page 4 of 10 Medical Record Number: [id number] Patient Account Number: [id number] Date of Birth/Sex: Treating RN: March 29, 1971 (53 y.o. [name] [name] Primary Care Scarlette Hogston: PA TIENT, NO Other Clinician: Referring Adahlia Stembridge: Treating [name]/Extender: Tilda Franco in Treatment: 0 Edema Assessment Assessed: [Left: Yes] [Right: No] Edema: [Left: Ye] [Right: s] Calf Left: Right: Point of Measurement: 31.5 cm From Medial Instep 32 cm Ankle Left: Right: Point of Measurement: 10 cm From Medial Instep 21.5 cm Knee To Floor Left: Right: From Medial Instep 42 cm Vascular Assessment Pulses: Dorsalis Pedis Palpable: [Left:Yes] Doppler Audible: [Left:Yes] Posterior Tibial Palpable: [Left:Yes] Doppler Audible: [Left:Yes] Extremity colors, hair growth, and conditions: Extremity Color: [Left:Normal] Hair Growth on Extremity: [Left:Yes] Temperature of Extremity: [Left:Hot] Capillary Refill: [Left:< 3 seconds] Dependent Rubor: [Left:No] Blanched when Elevated: [Left:No] Lipodermatosclerosis: [Left:No] Blood Pressure: Brachial: [Left:152] Ankle: [Left:Dorsalis Pedis: 50 0.33] Toe  Nail Assessment Left: Right: Thick: No Discolored: No Deformed: No Improper Length and Hygiene: No Electronic Signature(s) Signed: 06/13/2023 4:48:15 PM By: Shawn Stall RN, BSN Entered By: Shawn Stall on 06/13/2023 13:16:11 -------------------------------------------------------------------------------- Multi Wound Chart Details Patient Name: Date of Service: PA [name], DA NNY L. 06/13/2023 12:45 PM Medical Record Number: [id number] Patient Account Number: [id number] Date of Birth/Sex: Treating RN: 06/10/1970 (53 y.o. M) Primary Care Irie Dowson: PA Fidela Juneau Other Clinician: Referring Shwanda Soltis: Treating Zona Pedro/Extender: Tilda Franco in TreatmentIzora Gala Spina, [name] [name] ([id number]) [id number] Page 5 of 10 Vital Signs Height(in): 68 Capillary Blood  Glucose(mg/dl): 865 Weight(lbs): 784 Pulse(bpm): 96 Body Mass Index(BMI): 24.9 Blood Pressure(mmHg): 151/84 Temperature(F): 97.8 Respiratory Rate(breaths/min): 20 [1:Photos:] [N/A:N/A] Left, Dorsal Foot Left T Fifth oe N/A Wound Location: Blister Not Known N/A Wounding Event: Diabetic Wound/Ulcer of the Lower Diabetic Wound/Ulcer of the Lower N/A Primary Etiology: Extremity Extremity N/A Arterial Insufficiency Ulcer N/A Secondary Etiology: Chronic Obstructive Pulmonary Chronic Obstructive Pulmonary N/A Comorbid History: Disease (COPD), Coronary Artery Disease (COPD), Coronary Artery Disease, Hypertension, Peripheral Disease, Hypertension, Peripheral Arterial Disease, Type II Diabetes Arterial Disease, Type II Diabetes 01/31/2023 06/10/2023 N/A Date Acquired: 0 0 N/A Weeks of Treatment: Open Open N/A Wound Status: No No N/A Wound Recurrence: No Yes N/A Pending A mputation on Presentation: 1.5x1.5x0.2 2x3x0.1 N/A Measurements L x W x D (cm) 1.767 4.712 N/A A (cm) : rea 0.353 0.471 N/A Volume (cm) : Grade 1 Unable to visualize wound bed N/A Classification: Medium None Present  N/A Exudate A mount: Serosanguineous N/A N/A Exudate Type: red, brown N/A N/A Exudate Color: Distinct, outline attached Distinct, outline attached N/A Wound Margin: Small (1-33%) N/A N/A Granulation A mount: Red, Pink N/A N/A Granulation Quality: Large (67-100%) N/A N/A Necrotic A mount: Eschar, Adherent Slough Eschar N/A Necrotic Tissue: Fat Layer (Subcutaneous Tissue): Yes Fascia: No N/A Exposed Structures: Fascia: No Fat Layer (Subcutaneous Tissue): No Tendon: No Tendon: No Muscle: No Muscle: No Joint: No Joint: No Bone: No Bone: No Small (1-33%) None N/A Epithelialization: Excoriation: No Excoriation: No N/A Periwound Skin Texture: Induration: No Induration: No Callus: No Callus: No Crepitus: No Crepitus: No Rash: No Rash: No Scarring: No Scarring: No Dry/Scaly: Yes Maceration: No N/A Periwound Skin Moisture: Maceration: No Dry/Scaly: No Erythema: Yes Erythema: Yes N/A Periwound Skin Color: Atrophie Blanche: No Atrophie Blanche: No Cyanosis: No Cyanosis: No Ecchymosis: No Ecchymosis: No Hemosiderin Staining: No Hemosiderin Staining: No Mottled: No Mottled: No Pallor: No Pallor: No Rubor: No Rubor: No Circumferential Circumferential N/A Erythema Location: Hot Hot N/A Temperature: Treatment Notes Electronic Signature(s) Signed: 06/13/2023 5:12:23 PM By: Geralyn Corwin DO Entered By: Geralyn Corwin on 06/13/2023 13:30:27 Burek, [name] [name] ([id number]) [id number] Page 6 of 10 -------------------------------------------------------------------------------- Multi-Disciplinary Care Plan Details Patient Name: Date of Service: [name] 06/13/2023 12:45 PM Medical Record Number: [id number] Patient Account Number: [id number] Date of Birth/Sex: Treating RN: [birth year] (53 y.o. [name] [name] Primary Care Deannah Rossi: PA Fidela Juneau Other Clinician: Referring Helem Reesor: Treating Narada Uzzle/Extender: Tilda Franco in Treatment: 0 Active Inactive Electronic Signature(s) Signed: 06/20/2023 5:42:27 PM By: Shawn Stall RN, BSN Previous Signature: 06/13/2023 4:48:15 PM Version By: Shawn Stall RN, BSN Entered By: Shawn Stall on 06/20/2023 17:42:26 -------------------------------------------------------------------------------- Pain Assessment Details Patient Name: Date of Service: PA [name], DA NNY L. 06/13/2023 12:45 PM Medical Record Number: [id number] Patient Account Number: [id number] Date of Birth/Sex: Treating RN: 11-Jul-1970 (53 y.o. [name] [name] Primary Care An Schnabel: PA Zenovia Jordan, NO Other Clinician: Referring Kayshawn Ozburn: Treating Walsie Smeltz/Extender: Tilda Franco in Treatment: 0 Active Problems Location of Pain Severity and Description of Pain Patient Has Paino Yes Site Locations Pain Location: Pain in Ulcers Rate the pain. Current Pain Level: 6 Pain Management and Medication Current Pain Management: Medication: No Cold Application: No Rest: No Massage: No Activity: No T.E.N.S.: No Heat Application: No Leg drop or elevation: No Is the Current Pain Management Adequate: Adequate How does your wound impact your activities of daily livingo Sleep: No Bathing: No Appetite: No Relationship With Others: No Szymanowski, Harlen L ([id number]) [id number] Page 7 of 10 Bladder Continence: No  Emotions: No Bowel Continence: No Work: No Toileting: No Drive: No Dressing: No Hobbies: No Psychologist, prison and probation services) Signed: 06/13/2023 4:48:15 PM By: Shawn Stall RN, BSN Entered By: Shawn Stall on 06/13/2023 12:55:16 -------------------------------------------------------------------------------- Patient/Caregiver Education Details Patient Name: Date of Service: [name] 1/13/2025andnbsp12:45 PM Medical Record Number: [id number] Patient Account Number: [id number] Date of Birth/Gender: Treating RN: November 30, 1970 (53 y.o. [name] [name] Primary Care Physician: PA Zenovia Jordan, NO Other Clinician: Referring Physician: Treating Physician/Extender: Tilda Franco in Treatment: 0 Education Assessment Education Provided To: Patient Education Topics Provided Pain: Handouts: A Guide to Pain Control Methods: Explain/Verbal Responses: Reinforcements needed Smoking and Wound Healing: Handouts: Smoking and Wound Healing Methods: Explain/Verbal Responses: Reinforcements needed Welcome [facility] Packet: o Handouts: Welcome [facility] o Methods: Explain/Verbal Responses: Reinforcements needed Wound/Skin Impairment: Handouts: Caring for Your Ulcer Methods: Explain/Verbal Responses: Reinforcements needed Electronic Signature(s) Signed: 06/13/2023 4:48:15 PM By: Shawn Stall RN, BSN Entered By: Shawn Stall on 06/13/2023 13:15:31 -------------------------------------------------------------------------------- Wound Assessment Details Patient Name: Date of Service: PA [name], DA NNY L. 06/13/2023 12:45 PM Medical Record Number: [id number] Patient Account Number: [id number] Date of Birth/Sex: Treating RN: 03/15/1971 (53 y.o. [name] [name] Primary Care Nafis Farnan: PA Fidela Juneau Other Clinician: Referring Jesstin Studstill: Treating Prim Morace/Extender: Decorian, [place], [name] [name] ([id number]) [id number] Page 8 of 10 Weeks in Treatment: 0 Wound Status Wound Number: 1 Primary Diabetic Wound/Ulcer of the Lower Extremity Etiology: Wound Location: Left, Dorsal Foot Wound Open Wounding Event: Blister Status: Date Acquired: 01/31/2023 Comorbid Chronic Obstructive Pulmonary Disease (COPD), Coronary Artery Weeks Of Treatment: 0 History: Disease, Hypertension, Peripheral Arterial Disease, Type II Clustered Wound: No Diabetes Photos Wound Measurements Length: (cm) 1.5 Width: (cm) 1.5 Depth: (cm) 0.2 Area: (cm) 1.767 Volume: (cm) 0.353 % Reduction in  Area: % Reduction in Volume: Epithelialization: Small (1-33%) Tunneling: No Undermining: No Wound Description Classification: Grade 1 Wound Margin: Distinct, outline attached Exudate Amount: Medium Exudate Type: Serosanguineous Exudate Color: red, brown Foul Odor After Cleansing: No Slough/Fibrino Yes Wound Bed Granulation Amount: Small (1-33%) Exposed Structure Granulation Quality: Red, Pink Fascia Exposed: No Necrotic Amount: Large (67-100%) Fat Layer (Subcutaneous Tissue) Exposed: Yes Necrotic Quality: Eschar, Adherent Slough Tendon Exposed: No Muscle Exposed: No Joint Exposed: No Bone Exposed: No Periwound Skin Texture Texture Color No Abnormalities Noted: No No Abnormalities Noted: No Callus: No Atrophie Blanche: No Crepitus: No Cyanosis: No Excoriation: No Ecchymosis: No Induration: No Erythema: Yes Rash: No Erythema Location: Circumferential Scarring: No Hemosiderin Staining: No Mottled: No Moisture Pallor: No No Abnormalities Noted: No Rubor: No Dry / Scaly: Yes Maceration: No Temperature / Pain Temperature: Hot Electronic Signature(s) Signed: 06/13/2023 4:48:15 PM By: Shawn Stall RN, BSN Signed: 06/13/2023 5:23:50 PM By: Thayer Dallas Entered By: Thayer Dallas on 06/13/2023 12:57:36 Hardie, [name] [name] ([id number]) [id number] Page 9 of 10 -------------------------------------------------------------------------------- Wound Assessment Details Patient Name: Date of Service: [name] 06/13/2023 12:45 PM Medical Record Number: [id number] Patient Account Number: [id number] Date of Birth/Sex: Treating RN: 09/05/1970 (53 y.o. [name] [name] Primary Care Kendre Jacinto: PA Zenovia Jordan, NO Other Clinician: Referring Veretta Sabourin: Treating Edilia Ghuman/Extender: Tilda Franco in Treatment: 0 Wound Status Wound Number: 2 Primary Diabetic Wound/Ulcer of the Lower Extremity Etiology: Wound Location: Left T Fifth oe Secondary  Arterial Insufficiency Ulcer Wounding Event: Not Known Etiology: Date Acquired: 06/10/2023 Wound Open Weeks Of Treatment: 0 Status: Clustered Wound: No Comorbid Chronic Obstructive Pulmonary Disease (COPD), Coronary Artery Pending Amputation On Presentation History: Disease,  Hypertension, Peripheral Arterial Disease, Type II Diabetes Photos Wound Measurements Length: (cm) 2 Width: (cm) 3 Depth: (cm) 0.1 Area: (cm) 4.712 Volume: (cm) 0.471 % Reduction in Area: % Reduction in Volume: Epithelialization: None Tunneling: No Undermining: No Wound Description Classification: Grade 3 Wagner Verification: Other Wound Margin: Distinct, outline attached Method: Physician observation Rationale: dry gangrene Exudate Amount: None Present Foul Odor After Cleansing: No Slough/Fibrino No Wound Bed Necrotic Amount: Large (67-100%) Exposed Structure Necrotic Quality: Eschar Fascia Exposed: No Fat Layer (Subcutaneous Tissue) Exposed: No Tendon Exposed: No Muscle Exposed: No Joint Exposed: No Bone Exposed: No Periwound Skin Texture Texture Color No Abnormalities Noted: No No Abnormalities Noted: No Callus: No Atrophie Blanche: No Crepitus: No Cyanosis: No Excoriation: No Ecchymosis: No Induration: No Erythema: Yes Rash: No Erythema Location: Circumferential Scarring: No Hemosiderin Staining: No Mottled: No Moisture Pallor: No No Abnormalities Noted: No Pore, Burnette L ([id number]) [id number] Page 10 of 10 Rubor: No Dry / Scaly: No Maceration: No Temperature / Pain Temperature: Hot Electronic Signature(s) Signed: 06/13/2023 4:48:15 PM By: Shawn Stall RN, BSN Entered By: Shawn Stall on 06/13/2023 14:41:10 -------------------------------------------------------------------------------- Vitals Details Patient Name: Date of Service: PA [name], DA NNY L. 06/13/2023 12:45 PM Medical Record Number: [id number] Patient Account Number:  [id number] Date of Birth/Sex: Treating RN: July 21, 1970 (53 y.o. [name] [name] Primary Care Eusebia Grulke: PA TIENT, NO Other Clinician: Referring Ayjah Show: Treating Messiah Rovira/Extender: Tilda Franco in Treatment: 0 Vital Signs Time Taken: 12:51 Temperature (F): 97.8 Height (in): 68 Pulse (bpm): 96 Source: Stated Respiratory Rate (breaths/min): 20 Weight (lbs): 164 Blood Pressure (mmHg): 151/84 Source: Stated Capillary Blood Glucose (mg/dl): 564 Body Mass Index (BMI): 24.9 Reference Range: 80 - 120 mg / dl Electronic Signature(s) Signed: 06/13/2023 4:48:15 PM By: Shawn Stall RN, BSN Entered By: Shawn Stall on 06/13/2023 12:52:18

## 2023-06-15 NOTE — Progress Notes (Signed)
 Neubert, [name] [name] ([id number]) [id number] Page 1 of 7 Visit Report for 06/13/2023 Chief Complaint Document Details Patient Name: Date of Service: PA SCHA [name], DA NNY [name]. 06/13/2023 12:45 PM Medical Record Number: [id number] Patient Account Number: [id number] Date of Birth/Sex: Treating RN: 08-Jun-1970 (53 y.o. M) Primary Care Provider: PA SANDRA, NO Other Clinician: Referring Provider: Treating Provider/Extender: Rosan Harlene Duos in Treatment: 0 Information Obtained from: Patient Chief Complaint 06/13/2023; left foot wounds Electronic Signature(s) Signed: 06/13/2023 5:12:23 PM By: Rosan Harlene DO Entered By: Rosan Harlene on 06/13/2023 13:30:43 -------------------------------------------------------------------------------- HPI Details Patient Name: Date of Service: PA SCHA [name], DA NNY [name]. 06/13/2023 12:45 PM Medical Record Number: [id number] Patient Account Number: [id number] Date of Birth/Sex: Treating RN: [birth year] (53 y.o. M) Primary Care Provider: PA SANDRA, NO Other Clinician: Referring Provider: Treating Provider/Extender: Rosan Harlene Duos in Treatment: 0 History of Present Illness HPI Description: 06/13/2023 [name] is a 53 year old male with a past medical history of 40-pack-year smoking history currently smoking 1 pack a day, coronary artery disease with last Myoview  in 2022 suggesting mild inferior apical ischemic territory, uncontrolled type 2 diabetes with last hemoglobin A1c of 9 that presents to the clinic for a 42-month history of wound to the dorsal aspect of the left foot. He has been treating this with mupirocin  ointment. He states it started out as a blister but has had a hard time healing. Unfortunately 2 days ago he developed a necrotic area to the left fifth toe. He is now having increased pain with increasing erythema and warmth to the left leg. ABIs in office were 0.33. This is the first time he has had wounds to his  toes. He currently denies fever/chills, nausea/vomiting. Electronic Signature(s) Signed: 06/13/2023 5:12:23 PM By: Rosan Harlene DO Entered By: Rosan Harlene on 06/13/2023 13:35:42 -------------------------------------------------------------------------------- Physical Exam Details Patient Name: Date of Service: PA SCHA [name], DA NNY [name]. 06/13/2023 12:45 PM Medical Record Number: [id number] Patient Account Number: [id number] Date of Birth/Sex: Treating RN: 06-14-1970 (53 y.o. M) Primary Care Provider: PA SANDRA JOHNS Other Clinician: Broady, Phil [name] ([id number]) [id number] Page 2 of 7 Referring Provider: Treating Provider/Extender: Rosan Harlene Duos in Treatment: 0 Constitutional respirations regular, non-labored and within target range for patient.SABRA Psychiatric pleasant and cooperative. Notes T the left foot dorsal aspect there is an open wound with granulation tissue and nonviable tissue. T the fifth left toe the plantar aspect is necrotic. No o o drainage noted. T the foot and leg there is an increase in warmth and erythema. Patient reports chronic pain although worsening over the past couple weeks to o the leg. Left foot is mostly warm With some areas of coolness at the toes. Cannot palpate dorsalis or tibial pulses however can hear both on Doppler. Electronic Signature(s) Signed: 06/13/2023 5:12:23 PM By: Rosan Harlene DO Entered By: Rosan Harlene on 06/13/2023 13:37:15 -------------------------------------------------------------------------------- Physician Orders Details Patient Name: Date of Service: PA SCHA [name], DA NNY [name]. 06/13/2023 12:45 PM Medical Record Number: [id number] Patient Account Number: [id number] Date of Birth/Sex: Treating RN: Dec 27, 1970 (53 y.o. NETTY Drury Nestle Primary Care Provider: PA SANDRA, NO Other Clinician: Referring Provider: Treating Provider/Extender: Rosan Harlene Duos in Treatment: 0 The following  information was scribed by: Drury Nestle The information was scribed for: Rosan Harlene Verbal / Phone Orders: No Diagnosis Coding Follow-up Appointments ppointment in 1 week. - Dr. Rosan (front office to schedule) Return A Other: - Go to the emergency department due to left fith toe-necrotic Anesthetic (In clinic) Topical Lidocaine  4% applied  to wound bed Wound Treatment Wound #1 - Foot Wound Laterality: Dorsal, Left Cleanser: Soap and Water  1 x Per Day/30 Days Discharge Instructions: May shower and wash wound with dial antibacterial soap and water  prior to dressing change. Cleanser: Vashe 5.8 (oz) 1 x Per Day/30 Days Discharge Instructions: Cleanse the wound with Vashe prior to applying a clean dressing using gauze sponges, not tissue or cotton balls. Prim Dressing: MediHoney Gel, tube 1.5 (oz) 1 x Per Day/30 Days ary Discharge Instructions: Apply to wound bed as instructed Secondary Dressing: Zetuvit Plus Silicone Border Dressing 3x3 (in/in) 1 x Per Day/30 Days Discharge Instructions: Apply silicone border over primary dressing as directed. Wound #2 - T Fifth oe Wound Laterality: Left Cleanser: Soap and Water  1 x Per Day/30 Days Discharge Instructions: May shower and wash wound with dial antibacterial soap and water  prior to dressing change. Topical: betadine 1 x Per Day/30 Days Discharge Instructions: apply directly to wound bed. Secondary Dressing: Woven Gauze Sponges 2x2 in 1 x Per Day/30 Days Discharge Instructions: Apply over primary dressing as directed. Secured With: 65M Medipore H Soft Cloth Surgical T ape, 4 x 10 (in/yd) 1 x Per Day/30 Days Discharge Instructions: Secure with tape as directed. Rissmiller, Glyndon [name] ([id number]) [id number] Page 3 of 7 Patient Medications llergies: bee venom protein (honey bee), Nexium, morphine , Prilosec A Notifications Medication Indication Start End applied only in clinic. 06/13/2023 lidocaine  DOSE topical  4 % cream - cream topical Electronic Signature(s) Signed: 06/13/2023 5:12:23 PM By: Rosan Raisin DO Entered By: Rosan Raisin on 06/13/2023 13:37:21 -------------------------------------------------------------------------------- Problem List Details Patient Name: Date of Service: PA SCHA [name], DA NNY [name]. 06/13/2023 12:45 PM Medical Record Number: [id number] Patient Account Number: [id number] Date of Birth/Sex: Treating RN: 12-14-1970 (53 y.o. M) Primary Care Provider: PA SANDRA, NO Other Clinician: Referring Provider: Treating Provider/Extender: Rosan Raisin Duos in Treatment: 0 Active Problems ICD-10 Encounter Code Description Active Date MDM Diagnosis E11.621 Type 2 diabetes mellitus with foot ulcer 06/13/2023 No Yes I70.245 Atherosclerosis of native arteries of left leg with ulceration of other part of 06/13/2023 No Yes foot I25.10 Atherosclerotic heart disease of native coronary artery without angina pectoris 06/13/2023 No Yes L97.522 Non-pressure chronic ulcer of other part of left foot with fat layer exposed 06/13/2023 No Yes L97.528 Non-pressure chronic ulcer of other part of left foot with other specified 06/13/2023 No Yes severity J44.9 Chronic obstructive pulmonary disease, unspecified 06/13/2023 No Yes Inactive Problems Resolved Problems Electronic Signature(s) Signed: 06/13/2023 5:12:23 PM By: Rosan Raisin DO Entered By: Rosan Raisin on 06/13/2023 13:30:21 Bednarz, SALOMON CROME ([id number]) [id number] Page 4 of 7 -------------------------------------------------------------------------------- Progress Note Details Patient Name: Date of Service: PA SCHA [name], [place] NNY [name]. 06/13/2023 12:45 PM Medical Record Number: [id number] Patient Account Number: [id number] Date of Birth/Sex: Treating RN: [birth year] (52 y.o. M) Primary Care Provider: PA SANDRA, NO Other Clinician: Referring Provider: Treating Provider/Extender: Rosan Raisin Duos in  Treatment: 0 Subjective Chief Complaint Information obtained from Patient 06/13/2023; left foot wounds History of Present Illness (HPI) 06/13/2023 [name] is a 53 year old male with a past medical history of 40-pack-year smoking history currently smoking 1 pack a day, coronary artery disease with last Myoview  in 2022 suggesting mild inferior apical ischemic territory, uncontrolled type 2 diabetes with last hemoglobin A1c of 9 that presents to the clinic for a 60-month history of wound to the dorsal aspect of the left foot. He has been treating this with mupirocin  ointment. He states it started out as a blister but has  had a hard time healing. Unfortunately 2 days ago he developed a necrotic area to the left fifth toe. He is now having increased pain with increasing erythema and warmth to the left leg. ABIs in office were 0.33. This is the first time he has had wounds to his toes. He currently denies fever/chills, nausea/vomiting. Patient History Information obtained from Chart. Allergies Prilosec (Severity: Moderate), bee venom protein (honey bee) (Severity: Severe), Nexium (Severity: Severe), morphine  (Severity: Severe) Family History Unknown History. Social History Current every day smoker - 1 ppd, Marital Status - Single, Alcohol Use - Never, Drug Use - No History, Caffeine  Use - Never. Medical History Respiratory Patient has history of Chronic Obstructive Pulmonary Disease (COPD) Cardiovascular Patient has history of Coronary Artery Disease, Hypertension, Peripheral Arterial Disease Endocrine Patient has history of Type II Diabetes - Hem A 1C 9% ( 04/11/23) Patient is treated with Insulin . Medical A Surgical History Notes nd Psychiatric Hx: PTSD;Major Depression Objective Constitutional respirations regular, non-labored and within target range for patient.. Vitals Time Taken: 12:51 PM, Height: 68 in, Source: Stated, Weight: 164 lbs, Source: Stated, BMI: 24.9,  Temperature: 97.8 F, Pulse: 96 bpm, Respiratory Rate: 20 breaths/min, Blood Pressure: 151/84 mmHg, Capillary Blood Glucose: 165 mg/dl. Psychiatric pleasant and cooperative. General Notes: T the left foot dorsal aspect there is an open wound with granulation tissue and nonviable tissue. T the fifth left toe the plantar aspect is o o necrotic. No drainage noted. T the foot and leg there is an increase in warmth and erythema. Patient reports chronic pain although worsening over the past o couple weeks to the leg. Left foot is mostly warm With some areas of coolness at the toes. Cannot palpate dorsalis or tibial pulses however can hear both on Doppler. Dinneen, Sharbel [name] ([id number]) [id number] Page 5 of 7 Integumentary (Hair, Skin) Wound #1 status is Open. Original cause of wound was Blister. The date acquired was: 01/31/2023. The wound is located on the Left,Dorsal Foot. The wound measures 1.5cm length x 1.5cm width x 0.2cm depth; 1.767cm^2 area and 0.353cm^3 volume. There is Fat Layer (Subcutaneous Tissue) exposed. There is no tunneling or undermining noted. There is a medium amount of serosanguineous drainage noted. The wound margin is distinct with the outline attached to the wound base. There is small (1-33%) red, pink granulation within the wound bed. There is a large (67-100%) amount of necrotic tissue within the wound bed including Eschar and Adherent Slough. The periwound skin appearance exhibited: Dry/Scaly, Erythema. The periwound skin appearance did not exhibit: Callus, Crepitus, Excoriation, Induration, Rash, Scarring, Maceration, Atrophie Blanche, Cyanosis, Ecchymosis, Hemosiderin Staining, Mottled, Pallor, Rubor. The surrounding wound skin color is noted with erythema which is circumferential. Periwound temperature was noted as Hot. Wound #2 status is Open. Original cause of wound was Not Known. The date acquired was: 06/10/2023. The wound is located on the Left  T Fifth. The wound oe measures 2cm length x 3cm width x 0.1cm depth; 4.712cm^2 area and 0.471cm^3 volume. There is no tunneling or undermining noted. There is a none present amount of drainage noted. The wound margin is distinct with the outline attached to the wound base. There is a large (67-100%) amount of necrotic tissue within the wound bed including Eschar. The periwound skin appearance exhibited: Erythema. The periwound skin appearance did not exhibit: Callus, Crepitus, Excoriation, Induration, Rash, Scarring, Dry/Scaly, Maceration, Atrophie Blanche, Cyanosis, Ecchymosis, Hemosiderin Staining, Mottled, Pallor, Rubor. The surrounding wound skin color is noted with erythema which is circumferential. Periwound temperature was  noted as Hot. 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 The wound itself appears stable. I recommended Medihoney here. Unfortunately he has developed a necrotic area to the left fifth toe that occurred over the past 2 days spontaneously. He is now having increased redness and warmth to the left leg. His ABIs in office were 0.33. I was unable to palpate pedal pulses however was able to hear them on Doppler including dorsalis and anterior tibialis. Due to the acute nature of symptoms patient was advised to go to the ED due to critical limb ischemia and emergent imaging. He expressed understanding and  stated he will go after this visit to the [facility] emergency department. Plan Follow-up Appointments: Return Appointment in 1 week. - Dr. Rosan (front office to schedule) Other: - Go to the emergency department due to left fith toe-necrotic Anesthetic: (In clinic) Topical Lidocaine  4% applied to wound bed The following medication(s) was prescribed: lidocaine  topical 4 % cream cream topical for applied only in clinic. was prescribed at facility WOUND #1: - Foot Wound Laterality: Dorsal, Left Cleanser: Soap and Water  1 x Per Day/30 Days Discharge Instructions: May shower and wash wound with dial antibacterial soap and water  prior to dressing change. Cleanser: Vashe 5.8 (oz) 1 x Per Day/30 Days Discharge Instructions: Cleanse the wound with Vashe prior to applying a clean dressing using gauze sponges, not tissue or cotton balls. Prim Dressing: MediHoney Gel, tube 1.5 (oz) 1 x Per Day/30 Days ary Discharge Instructions: Apply to wound bed as instructed Secondary Dressing: Zetuvit Plus Silicone Border Dressing 3x3 (in/in) 1 x Per Day/30 Days Discharge Instructions: Apply silicone border over primary dressing as directed. WOUND #2: - T Fifth Wound Laterality: Left oe Cleanser: Soap and Water  1 x Per Day/30 Days Discharge Instructions: May shower and wash wound with dial antibacterial soap and water  prior to dressing change. Topical: betadine 1 x Per Day/30 Days Discharge Instructions: apply directly to wound bed. Secondary Dressing: Woven Gauze Sponges 2x2 in 1 x Per Day/30 Days Discharge Instructions: Apply over primary dressing as directed. Secured With: 48M Medipore H Soft Cloth Surgical T ape, 4 x 10 (in/yd) 1 x Per Day/30 Days Discharge Instructions: Secure with tape as directed. 1. Advised to go to the ED for critical limb ischemia requiring further imaging for acute symptoms of necrotic left fifth toe, increased warmth and erythema to the left leg 2. Medihoney to the dorsal left  foot wound 3. Follow-up in our clinic in 1 week, Pending treatment plan from the ED Electronic Signature(s) Signed: 06/13/2023 4:48:15 PM By: Drury Nestle RN, BSN Signed: 06/13/2023 5:12:23 PM By: Rosan Raisin DO Entered By: Drury Nestle on 06/13/2023 14:41:20 Reddish, SALOMON CROME ([id number]) [id number] Page 6 of 7 -------------------------------------------------------------------------------- HxROS Details Patient Name: Date of Service: PA SCHA [name], DA NNY [name]. 06/13/2023 12:45 PM Medical Record Number: [id number] Patient Account Number: [id number] Date of Birth/Sex: Treating RN: Oct 06, 1970 (53 y.o. NETTY Claven Pollen Primary Care Provider: PA SANDRA, NO Other Clinician: Referring  Provider: Treating Provider/Extender: Rosan Harlene Duos in Treatment: 0 Information Obtained From Chart Respiratory Medical History: Positive for: Chronic Obstructive Pulmonary Disease (COPD) Cardiovascular Medical History: Positive for: Coronary Artery Disease; Hypertension; Peripheral Arterial Disease Endocrine Medical History: Positive for: Type II Diabetes - Hem A 1C 9% ( 04/11/23) Treated with: Insulin  Psychiatric Medical History: Past Medical History Notes: Hx: PTSD;Major Depression Immunizations Pneumococcal Vaccine: Received Pneumococcal Vaccination: No Implantable Devices No devices added Family and Social History Unknown History: Yes; Current every day smoker - 1 ppd; Marital Status - Single; Alcohol Use: Never; Drug Use: No History; Caffeine  Use: Never Social Determinants of Health (SDOH) 1. In the past 2 months, did you or others you live with eat smaller meals or skip meals because you didn't have money for foodo : No 2. Are you homeless or worried that you might be in the futureo : No 3. Do you have trouble paying for your utilities (gas, electricity, phone)o : No 4. Do you have trouble finding or paying for a rideo : No 5. Do you need daycare, or  better daycare, for your kidso : No 6. Are you unemployed or without regular incomeo : No 7. Do you need help finding a better jobo : No 8. Do you need help getting more educationo : No 9. Are you concerned about someone in your home using drugs or alcoholo : No 10. Do you feel unsafe in your daily lifeo : No 11. Is anyone in your home threatening or abusing youo : No 12. Do you lack quality relationships that make you feel valued and supportedo : No 13. Do you need help getting cultural information in a language you understando : No 14. Do you need help getting internet accesso : No Advanced Directives and Instructions Spiritual or Cultural beliefs preclude asking about Advance Care Planning: No Advanced Directives: No Patient wants information on Advanced Directives: No Do not resuscitate: No Living Will: No Medical Power of Attorney: No Surrogate Decision Maker: No Zeitler, Krue [name] ([id number]) [id number] Page 7 of 7 Electronic Signature(s) Signed: 06/13/2023 4:48:15 PM By: Drury Nestle RN, BSN Signed: 06/13/2023 5:12:23 PM By: Rosan Harlene DO Signed: 06/14/2023 11:23:18 AM By: Claven Pollen RN Entered By: Drury Nestle on 06/13/2023 12:53:58 -------------------------------------------------------------------------------- SuperBill Details Patient Name: Date of Service: PA SCHA [name], DA NNY [name]. 06/13/2023 Medical Record Number: [id number] Patient Account Number: [id number] Date of Birth/Sex: Treating RN: Sep 26, 1970 (53 y.o. NETTY Drury Nestle Primary Care Provider: PA SANDRA, NO Other Clinician: Referring Provider: Treating Provider/Extender: Rosan Harlene Duos in Treatment: 0 Diagnosis Coding ICD-10 Codes Code Description E11.621 Type 2 diabetes mellitus with foot ulcer I70.245 Atherosclerosis of native arteries of left leg with ulceration of other part of foot I25.10 Atherosclerotic heart disease of native coronary artery without angina  pectoris L97.522 Non-pressure chronic ulcer of other part of left foot with fat layer exposed L97.528 Non-pressure chronic ulcer of other part of left foot with other specified severity J44.9 Chronic obstructive pulmonary disease, unspecified Facility Procedures : CPT4 Code: [id number] Description: 00784 - WOUND CARE VISIT-LEV 5 EST PT Modifier: Quantity: 1 Physician Procedures : CPT4 Code Description Modifier [id number] - WC PHYS LEVEL 5 - NEW PT ICD-10 Diagnosis Description E11.621 Type 2 diabetes mellitus with foot ulcer I70.245 Atherosclerosis of native arteries of left leg with ulceration of other part of foot L97.522  Non-pressure chronic ulcer of other part of left foot with fat layer exposed L97.528 Non-pressure chronic ulcer of other part of left foot with other  specified severity Quantity: 1 Electronic Signature(s) Signed: 06/13/2023 5:12:23 PM By: Rosan Raisin DO Entered By: Rosan Raisin on 06/13/2023 13:41:06

## 2023-06-16 ENCOUNTER — Ambulatory Visit (INDEPENDENT_AMBULATORY_CARE_PROVIDER_SITE_OTHER): Payer: MEDICAID | Admitting: Orthopedic Surgery

## 2023-06-16 DIAGNOSIS — I96 Gangrene, not elsewhere classified: Secondary | ICD-10-CM | POA: Diagnosis not present

## 2023-06-17 ENCOUNTER — Telehealth: Payer: Self-pay | Admitting: Orthopedic Surgery

## 2023-06-17 NOTE — Telephone Encounter (Signed)
I called the pt to advise that Dr. Lajoyce Corners is not back in the office until Tuesday. But I would let Dr. Lajoyce Corners know that he was having his procedure on Monday.

## 2023-06-17 NOTE — Telephone Encounter (Signed)
Patient called advised he is having a stent put in his left leg on Monday 06/20/2023. Patient asked if Dr. Lajoyce Corners would amputate his 5 toe on his left foot on Monday? Patient said he will be at [facility] Monday morning at 5:00am.  The number to contact patient is [phone]

## 2023-06-18 LAB — CULTURE, BLOOD (ROUTINE X 2): Culture: NO GROWTH

## 2023-06-20 ENCOUNTER — Other Ambulatory Visit: Payer: Self-pay

## 2023-06-20 ENCOUNTER — Ambulatory Visit (HOSPITAL_COMMUNITY)
Admission: RE | Admit: 2023-06-20 | Discharge: 2023-06-20 | Disposition: A | Discharge status: Home / Self Care | Payer: MEDICAID | Attending: Vascular Surgery | Admitting: Vascular Surgery

## 2023-06-20 ENCOUNTER — Encounter (HOSPITAL_COMMUNITY)
Admission: RE | Disposition: A | Discharge status: Home / Self Care | Payer: Self-pay | Source: Home / Self Care | Attending: Vascular Surgery

## 2023-06-20 ENCOUNTER — Encounter (HOSPITAL_BASED_OUTPATIENT_CLINIC_OR_DEPARTMENT_OTHER): Payer: MEDICAID | Admitting: Internal Medicine

## 2023-06-20 DIAGNOSIS — Z5986 Financial insecurity: Secondary | ICD-10-CM | POA: Insufficient documentation

## 2023-06-20 DIAGNOSIS — I7092 Chronic total occlusion of artery of the extremities: Secondary | ICD-10-CM | POA: Diagnosis not present

## 2023-06-20 DIAGNOSIS — E1152 Type 2 diabetes mellitus with diabetic peripheral angiopathy with gangrene: Secondary | ICD-10-CM | POA: Diagnosis present

## 2023-06-20 DIAGNOSIS — L97529 Non-pressure chronic ulcer of other part of left foot with unspecified severity: Secondary | ICD-10-CM | POA: Diagnosis not present

## 2023-06-20 DIAGNOSIS — Z833 Family history of diabetes mellitus: Secondary | ICD-10-CM | POA: Diagnosis not present

## 2023-06-20 DIAGNOSIS — F17210 Nicotine dependence, cigarettes, uncomplicated: Secondary | ICD-10-CM | POA: Insufficient documentation

## 2023-06-20 DIAGNOSIS — Z5941 Food insecurity: Secondary | ICD-10-CM | POA: Insufficient documentation

## 2023-06-20 DIAGNOSIS — Z8249 Family history of ischemic heart disease and other diseases of the circulatory system: Secondary | ICD-10-CM | POA: Insufficient documentation

## 2023-06-20 DIAGNOSIS — E11621 Type 2 diabetes mellitus with foot ulcer: Secondary | ICD-10-CM | POA: Diagnosis not present

## 2023-06-20 DIAGNOSIS — I70222 Atherosclerosis of native arteries of extremities with rest pain, left leg: Secondary | ICD-10-CM

## 2023-06-20 DIAGNOSIS — I70245 Atherosclerosis of native arteries of left leg with ulceration of other part of foot: Secondary | ICD-10-CM | POA: Diagnosis not present

## 2023-06-20 DIAGNOSIS — Z7984 Long term (current) use of oral hypoglycemic drugs: Secondary | ICD-10-CM | POA: Insufficient documentation

## 2023-06-20 DIAGNOSIS — Z794 Long term (current) use of insulin: Secondary | ICD-10-CM | POA: Insufficient documentation

## 2023-06-20 HISTORY — PX: PERIPHERAL VASCULAR BALLOON ANGIOPLASTY: CATH118281

## 2023-06-20 HISTORY — PX: ABDOMINAL AORTOGRAM W/LOWER EXTREMITY: CATH118223

## 2023-06-20 LAB — GLUCOSE, CAPILLARY: Glucose-Capillary: 141 mg/dL — ABNORMAL HIGH (ref 70–99)

## 2023-06-20 LAB — POCT I-STAT, CHEM 8
BUN: 26 mg/dL — ABNORMAL HIGH (ref 6–20)
Calcium, Ion: 1.16 mmol/L (ref 1.15–1.40)
Chloride: 102 mmol/L (ref 98–111)
Creatinine, Ser: 1.6 mg/dL — ABNORMAL HIGH (ref 0.61–1.24)
Glucose, Bld: 158 mg/dL — ABNORMAL HIGH (ref 70–99)
HCT: 40 % (ref 39.0–52.0)
Hemoglobin: 13.6 g/dL (ref 13.0–17.0)
Potassium: 5.1 mmol/L (ref 3.5–5.1)
Sodium: 140 mmol/L (ref 135–145)
TCO2: 31 mmol/L (ref 22–32)

## 2023-06-20 SURGERY — ABDOMINAL AORTOGRAM W/LOWER EXTREMITY
Anesthesia: LOCAL

## 2023-06-20 MED ORDER — SODIUM CHLORIDE 0.9 % WEIGHT BASED INFUSION: 1.0000 mL/kg/h | INTRAVENOUS | Status: DC

## 2023-06-20 MED ORDER — HEPARIN (PORCINE) IN NACL 1000-0.9 UT/500ML-% IV SOLN
INTRAVENOUS | Status: DC | PRN
  Administered 2023-06-20 (×2): 500 mL

## 2023-06-20 MED ORDER — ASPIRIN 81 MG PO CHEW
CHEWABLE_TABLET | ORAL | Status: DC | PRN
  Administered 2023-06-20: 81 mg via ORAL

## 2023-06-20 MED ORDER — SODIUM CHLORIDE 0.9% FLUSH: 3.0000 mL | INTRAVENOUS | Status: DC | PRN

## 2023-06-20 MED ORDER — HEPARIN SODIUM (PORCINE) 1000 UNIT/ML IJ SOLN
INTRAMUSCULAR | Status: AC
  Filled 2023-06-20: qty 10

## 2023-06-20 MED ORDER — HYDRALAZINE HCL 20 MG/ML IJ SOLN: 5.0000 mg | INTRAMUSCULAR | Status: DC | PRN

## 2023-06-20 MED ORDER — IODIXANOL 320 MG/ML IV SOLN
INTRAVENOUS | Status: DC | PRN
  Administered 2023-06-20: 100 mL

## 2023-06-20 MED ORDER — ASPIRIN 81 MG PO CHEW
CHEWABLE_TABLET | ORAL | Status: AC
  Filled 2023-06-20: qty 1

## 2023-06-20 MED ORDER — CLOPIDOGREL BISULFATE 300 MG PO TABS
ORAL_TABLET | ORAL | Status: AC
Start: 2023-06-20 — End: ?
  Filled 2023-06-20: qty 1

## 2023-06-20 MED ORDER — LABETALOL HCL 5 MG/ML IV SOLN: 10.0000 mg | INTRAVENOUS | Status: DC | PRN

## 2023-06-20 MED ORDER — LIDOCAINE HCL (PF) 1 % IJ SOLN
INTRAMUSCULAR | Status: AC
  Filled 2023-06-20: qty 30

## 2023-06-20 MED ORDER — CLOPIDOGREL BISULFATE 300 MG PO TABS
ORAL_TABLET | ORAL | Status: DC | PRN
  Administered 2023-06-20: 300 mg via ORAL

## 2023-06-20 MED ORDER — HEPARIN SODIUM (PORCINE) 1000 UNIT/ML IJ SOLN
INTRAMUSCULAR | Status: DC | PRN
  Administered 2023-06-20: 5000 [IU] via INTRAVENOUS

## 2023-06-20 MED ORDER — NICOTINE 14 MG/24HR TD PT24: 14.0000 mg | MEDICATED_PATCH | Freq: Every day | TRANSDERMAL | 0 refills | Status: AC

## 2023-06-20 MED ORDER — LABETALOL HCL 5 MG/ML IV SOLN
INTRAVENOUS | Status: AC
  Filled 2023-06-20: qty 4

## 2023-06-20 MED ORDER — ACETAMINOPHEN 325 MG PO TABS: 650.0000 mg | ORAL_TABLET | ORAL | Status: DC | PRN

## 2023-06-20 MED ORDER — SODIUM CHLORIDE 0.9% FLUSH: 3.0000 mL | Freq: Two times a day (BID) | INTRAVENOUS | Status: DC

## 2023-06-20 MED ORDER — MIDAZOLAM HCL 2 MG/2ML IJ SOLN
INTRAMUSCULAR | Status: AC
  Filled 2023-06-20: qty 2

## 2023-06-20 MED ORDER — SODIUM CHLORIDE 0.9 % IV SOLN
250.0000 mL | INTRAVENOUS | Status: DC | PRN
Start: 2023-06-20 — End: 2023-06-20

## 2023-06-20 MED ORDER — FENTANYL CITRATE (PF) 100 MCG/2ML IJ SOLN
INTRAMUSCULAR | Status: AC
  Filled 2023-06-20: qty 2

## 2023-06-20 MED ORDER — SODIUM CHLORIDE 0.9 % IV SOLN: INTRAVENOUS | Status: DC

## 2023-06-20 MED ORDER — ONDANSETRON HCL 4 MG/2ML IJ SOLN: 4.0000 mg | Freq: Four times a day (QID) | INTRAMUSCULAR | Status: DC | PRN

## 2023-06-20 MED ORDER — LIDOCAINE HCL (PF) 1 % IJ SOLN
INTRAMUSCULAR | Status: DC | PRN
  Administered 2023-06-20: 12 mL

## 2023-06-20 MED ORDER — FENTANYL CITRATE (PF) 100 MCG/2ML IJ SOLN
INTRAMUSCULAR | Status: DC | PRN
  Administered 2023-06-20: 50 ug via INTRAVENOUS

## 2023-06-20 MED ORDER — LABETALOL HCL 5 MG/ML IV SOLN
INTRAVENOUS | Status: DC | PRN
  Administered 2023-06-20: 20 mg via INTRAVENOUS

## 2023-06-20 MED ORDER — CLOPIDOGREL BISULFATE 75 MG PO TABS: 75.0000 mg | ORAL_TABLET | Freq: Every day | ORAL | 11 refills | Status: DC

## 2023-06-20 MED ORDER — MIDAZOLAM HCL 2 MG/2ML IJ SOLN
INTRAMUSCULAR | Status: DC | PRN
  Administered 2023-06-20: 1 mg via INTRAVENOUS

## 2023-06-20 SURGICAL SUPPLY — 18 items
BAG SNAP BAND KOVER 36X36 (MISCELLANEOUS) IMPLANT
BALLN IN.PACT DCB 6X120 (BALLOONS) ×2
CATH OMNI FLUSH 5F 65CM (CATHETERS) IMPLANT
CATH QUICKCROSS SUPP .035X90CM (MICROCATHETER) IMPLANT
CLOSURE MYNX CONTROL 6F/7F (Vascular Products) IMPLANT
COVER DOME SNAP 22 D (MISCELLANEOUS) IMPLANT
DCB IN.PACT 6X120 (BALLOONS) IMPLANT
GLIDEWIRE ADV .035X260CM (WIRE) IMPLANT
KIT ENCORE 26 ADVANTAGE (KITS) IMPLANT
KIT MICROPUNCTURE NIT STIFF (SHEATH) IMPLANT
KIT PV (KITS) ×3 IMPLANT
KIT SYRINGE INJ CVI SPIKEX1 (MISCELLANEOUS) IMPLANT
SET ATX-X65L (MISCELLANEOUS) IMPLANT
SHEATH CATAPULT 6F 45 MP (SHEATH) IMPLANT
SHEATH PINNACLE 5F 10CM (SHEATH) IMPLANT
SHEATH PINNACLE 6F 10CM (SHEATH) IMPLANT
SHEATH PROBE COVER 6X72 (BAG) IMPLANT
WIRE BENTSON .035X145CM (WIRE) IMPLANT

## 2023-06-20 NOTE — Interval H&P Note (Signed)
History and Physical Interval Note:  06/20/2023 7:05 AM  [name] [name]  has presented today for surgery, with the diagnosis of ischemia - cli with tissue loss left fot.  The various methods of treatment have been discussed with the patient and family. After consideration of risks, benefits and other options for treatment, the patient has consented to  Procedure(s): ABDOMINAL AORTOGRAM W/LOWER EXTREMITY (N/A) as a surgical intervention.  The patient's history has been reviewed, patient examined, no change in status, stable for surgery.  I have reviewed the patient's chart and labs.  Questions were answered to the patient's satisfaction.     Daria Pastures

## 2023-06-20 NOTE — Op Note (Signed)
Patient name: [name] [name] MRN: [id number] DOB: 19-Jun-1970 Sex: male  06/20/2023 Pre-operative Diagnosis: CL TI of left leg with toe wound Post-operative diagnosis:  Same Surgeon:  Daria Pastures, MD Procedure Performed:  Ultrasound-guided access right common femoral artery Aortogram and bilateral lower extremity angiogram Third order cannulation of left popliteal artery DCB angioplasty of left popliteal artery, 6 x 120 Impact Closure of right common femoral artery with Mynx closure device 38 minutes of moderate sedation   Indications: Patient is a 53 year old male with a history of tobacco abuse, diabetes, hypertension, hyperlipidemia and COPD who was found to have a left dorsal foot wound and a left fifth toe wound with dry gangrene.  He is being seen in the wound care clinic and was noted to have an abnormal ABI.  Aortogram with bilateral angiogram was offered, risk and benefits were reviewed, patient expressed understanding and elected to proceed.  Findings:  Widely patent aorta, and bilateral renal arteries.  Widely patent bilateral iliac systems  Left common femoral artery, profunda and SFA are patent.  There is a total occlusion of the left above-knee popliteal artery with reconstitution of the popliteal artery behind the knee.  There is two-vessel runoff via the AT and peroneal.  The PT is diminutive  Right common femoral artery, profunda and SFA are patent.  The popliteal artery is widely patent.  There is a short segment severe stenosis in the proximal AT but reconstitutes quickly.  The PT and peroneal are widely patent to the foot.   Procedure:  The patient was identified in the holding area and taken to the cath lab  The patient was then placed supine on the table and prepped and draped in the usual sterile fashion.  A time out was called.  Ultrasound was used to evaluate the right common femoral artery.  It was patent .  A digital ultrasound image was acquired.  A  micropuncture needle was used to access the right common femoral artery under ultrasound guidance.  An 018 wire was advanced without resistance and a micropuncture sheath was placed.  The 018 wire was removed and a benson wire was placed.  The micropuncture sheath was exchanged for a 5 french sheath.  An omniflush catheter was advanced over the wire to the level of L-1.  An abdominal angiogram was obtained.  Next, using the omniflush catheter and a Bentson wire, the aortic bifurcation was crossed and the catheter was placed into theleft external iliac artery and left runoff was obtained. This demonstrated the above findings.  The patient was then systemically heparinized.  A glide advantage wire was placed through the catheter and into the SFA.  The short 5 French sheath was then exchanged for a 6 [place] by 45 cm catapult sheath.  Using a glide advantage wire and a quick cross catheter the popliteal CTO was crossed and this was treated with a 6 x 120 Impact DCB.  Completion angiography demonstrated a focal dissection at the treated segment although this was not flow-limiting and there was brisk flow through the popliteal artery and into the preserve runoff of his AT and peroneal.  The long 6 French sheath was then exchanged for a short 6 French sheath. right runoff was performed via retrograde sheath injections which demonstrated the above findings.  The right common femoral artery was then closed with a Mynx closure device.  Contrast: 100cc  Sedation: 38 minutes  Impression: Adequate response to drug-coated balloon angioplasty of the left popliteal artery  occlusion.  Inline flow via the AT and peroneal on the left  Inline flow to the foot on the right via the peroneal and PT.   Daria Pastures MD [facility] Office: [phone]

## 2023-06-21 ENCOUNTER — Encounter (HOSPITAL_COMMUNITY): Payer: Self-pay | Admitting: Vascular Surgery

## 2023-06-21 NOTE — Progress Notes (Signed)
Office Visit Note   Patient: [name] [name]           Date of Birth: 06-02-1970           MRN: [id number] Visit Date: 06/16/2023              Requested by: No referring provider defined for this encounter. PCP: Patient, No Pcp Per  Chief Complaint  Patient presents with   Left Foot - Wound Check      HPI: Patient is a 53 year old gentleman who is seen for initial evaluation for gangrenous ulcer left little toe.  Patient states he went to the emergency room on January 13 radiographs were obtained.  Patient was referred to the emergency room by the wound center patient states he left AGAINST MEDICAL ADVICE.  Patient has endovascular evaluation scheduled for January 23.  Blood cultures showed no growth for 2 days.  Most recent hemoglobin A1c was 9.0.  Assessment & Plan: Visit Diagnoses:  1. Gangrene of toe of left foot (HCC)     Plan: Patient is currently on doxycycline.  Will reevaluate after his endovascular procedure and evaluate for possible ray amputation.  Patient will continue with his postoperative shoe.  Follow-Up Instructions: Return in about 4 weeks (around 07/14/2023).   Ortho Exam  Patient is alert, oriented, no adenopathy, well-dressed, normal affect, normal respiratory effort. Examination patient has gangrene of the left little toe there is no ascending cellulitis.  Patient also has a Wagner grade 1 ulcer base of the first metatarsal.  Patient is currently on doxycycline.  Imaging: No results found. No images are attached to the encounter.  Labs: Lab Results  Component Value Date   HGBA1C 9.0 (A) 04/11/2023   HGBA1C 13.2 (H) 08/29/2022   HGBA1C 12.9 (H) 08/28/2022   ESRSEDRATE 20 (H) 06/18/2022   ESRSEDRATE 1 08/20/2015   CRP <0.5 06/18/2022   CRP 2.5 01/23/2019   CRP <0.5 08/20/2015   REPTSTATUS 06/18/2023 FINAL 06/13/2023   CULT  06/13/2023    NO GROWTH 5 DAYS Performed at [facility], [address], [place], [place] [address]      Lab  Results  Component Value Date   ALBUMIN 3.2 (L) 08/27/2022   ALBUMIN 2.8 (L) 07/26/2022   ALBUMIN 4.1 07/06/2022    Lab Results  Component Value Date   MG 1.8 02/08/2023   MG 2.0 08/29/2022   MG 1.5 (L) 08/28/2022   No results found for: "VD25OH"  No results found for: "PREALBUMIN"    Latest Ref Rng & Units 06/20/2023    5:32 AM 06/13/2023    6:05 PM 02/08/2023    1:06 PM  CBC EXTENDED  WBC 4.0 - 10.5 K/uL  13.2  12.1   RBC 4.22 - 5.81 MIL/uL  4.59  4.71   Hemoglobin 13.0 - 17.0 g/dL 19.1  47.8  29.5   HCT 39.0 - 52.0 % 40.0  43.5  44.1   Platelets 150 - 400 K/uL  251  262   NEUT# 1.7 - 7.7 K/uL  9.7    Lymph# 0.7 - 4.0 K/uL  2.4       There is no height or weight on file to calculate BMI.  Orders:  No orders of the defined types were placed in this encounter.  No orders of the defined types were placed in this encounter.    Procedures: No procedures performed  Clinical Data: No additional findings.  ROS:  All other systems negative, except as  noted in the HPI. Review of Systems  Objective: Vital Signs: There were no vitals taken for this visit.  Specialty Comments:  No specialty comments available.  PMFS History: Patient Active Problem List   Diagnosis Date Noted   Type 2 diabetes mellitus with diabetic polyneuropathy, with long-term current use of insulin (HCC) 04/11/2023   Type 2 diabetes mellitus with hyperglycemia, with long-term current use of insulin (HCC) 04/11/2023   Diabetes mellitus (HCC) 04/11/2023   PTSD (post-traumatic stress disorder) 03/14/2023   Aortic valve sclerosis 09/16/2022   Hypoglycemia 08/27/2022   COPD GOLD 1/ AB 06/11/2022   Syncope and collapse 12/13/2021   Bifascicular block 12/13/2021   SIRS (systemic inflammatory response syndrome) (HCC) 12/13/2021   Renal insufficiency 12/13/2021   Esophageal motility disorder 12/13/2021   Heartburn    Scrotal abscess 11/19/2020   Severe sepsis (HCC) 09/03/2020   Non-adherence to  medical treatment 05/27/2020   Barrett's esophagus 01/23/2019   Diarrhea 01/23/2019   MDD (major depressive disorder), severe (HCC) 12/17/2018   DKA, type 2 (HCC) 11/07/2018   High anion gap metabolic acidosis 03/22/2018   Epigastric abdominal pain 03/22/2018   Nausea vomiting and diarrhea 03/22/2018   AKI (acute kidney injury) (HCC) 03/22/2018   Uncontrolled type 2 diabetes mellitus with hyperglycemia (HCC) 03/22/2018   ARF (acute renal failure) (HCC) 05/30/2017   Mixed hyperlipidemia 03/31/2017   Diabetic polyneuropathy (HCC) 03/31/2017   Vitamin B12 deficiency 03/31/2017   Vitamin D deficiency 03/31/2017   Right bundle branch block 02/14/2017   DKA (diabetic ketoacidosis) (HCC) 10/15/2016   Gastroesophageal reflux disease 10/15/2016   Benign hypertension 10/15/2016   Leukocytosis 11/25/2015   Intractable nausea and vomiting 11/25/2015   Uncontrolled type 2 diabetes mellitus with hypoglycemia, with long-term current use of insulin (HCC) 11/25/2015   Dental abscess 11/25/2015   Nausea and vomiting 11/25/2015   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 03/2015 Cath: LM nl, LAD mild diff dzs throughout w/ 50p/d, LCX diff dzs, 10m, RCA diff dzs, 23m-->Med rx; b. 12/2020 MV: EF 57%, small/mild apical inferior defect w/ partial rev/mild ischemia.   Chronic bronchitis (HCC)    Chronic upper back pain    COPD (chronic obstructive pulmonary disease) (HCC)    Depression    Diabetic peripheral neuropathy (HCC)    GERD (gastroesophageal reflux disease)    History of gout    Hyperlipemia    Hypertension    Migraine    Noncompliance    Ringing in the  ears, bilateral    Sleep apnea 2016   Type 2 diabetes mellitus (HCC)     Family History  Problem Relation Age of Onset   Diabetes Mother    Hypertension Mother    Cancer Mother    Throat cancer Mother    Diabetes Father    Hypertension Father    Hyperlipidemia Father    Congestive Heart Failure Sister    Colon cancer Neg Hx    Pancreatic cancer Neg Hx    Liver disease Neg Hx     Past Surgical History:  Procedure Laterality Date   ABDOMINAL AORTOGRAM W/LOWER EXTREMITY N/A 06/20/2023  Procedure: ABDOMINAL AORTOGRAM W/LOWER EXTREMITY;  Surgeon: Daria Pastures, MD;  Location: [facility] INVASIVE CV LAB;  Service: Cardiovascular;  Laterality: N/A;   ANKLE SURGERY Right 1982   "had extra bones in there; took them out"   APPENDECTOMY  1975   BIOPSY  12/26/2018   Procedure: BIOPSY;  Surgeon: Malissa Hippo, MD;  Location: [facility];  Service: Endoscopy;;  duodenal biopsies   BIOPSY  03/23/2019   Procedure: BIOPSY;  Surgeon: Malissa Hippo, MD;  Location: [facility];  Service: Endoscopy;;  esophagus   CARDIAC CATHETERIZATION N/A 03/28/2015   Procedure: Left Heart Cath and Coronary Angiography;  Surgeon: Yates Decamp, MD;  Location: [facility] INVASIVE CV LAB;  Service: Cardiovascular;  Laterality: N/A;   CARDIAC CATHETERIZATION N/A 03/28/2015   Procedure: Intravascular Pressure Wire/FFR Study;  Surgeon: Yates Decamp, MD;  Location: [facility] INVASIVE CV LAB;  Service: Cardiovascular;  Laterality: N/A;   CARPAL TUNNEL RELEASE Left ~ 2008   COLONOSCOPY WITH ESOPHAGOGASTRODUODENOSCOPY (EGD)     COLONOSCOPY WITH PROPOFOL N/A 12/24/2019   Procedure: COLONOSCOPY WITH PROPOFOL;  Surgeon: Malissa Hippo, MD;  Location: [facility];  Service: Endoscopy;  Laterality: N/A;  955   ELBOW FRACTURE SURGERY Left ~ 2008   ESOPHAGEAL MANOMETRY N/A 09/16/2021   Procedure: ESOPHAGEAL MANOMETRY (EM);  Surgeon: Shellia Cleverly, DO;  Location: [facility];  Service: Endoscopy;  Laterality: N/A;    ESOPHAGOGASTRODUODENOSCOPY N/A 12/26/2018   Procedure: ESOPHAGOGASTRODUODENOSCOPY (EGD);  Surgeon: Malissa Hippo, MD;  [facility]: [facility];  Service: Endoscopy;  Laterality: N/A;   ESOPHAGOGASTRODUODENOSCOPY (EGD) WITH PROPOFOL N/A 03/23/2019   Procedure: ESOPHAGOGASTRODUODENOSCOPY (EGD) WITH PROPOFOL;  Surgeon: Malissa Hippo, MD;  [facility];  Service: Endoscopy;  Laterality: N/A;  7:30   FRACTURE SURGERY     ankle and elbow   PERIPHERAL VASCULAR BALLOON ANGIOPLASTY  06/20/2023   Procedure: PERIPHERAL VASCULAR BALLOON ANGIOPLASTY;  Surgeon: Daria Pastures, MD;  Location: [facility] INVASIVE CV LAB;  Service: Cardiovascular;;   PH IMPEDANCE STUDY N/A 09/16/2021   Procedure: PH IMPEDANCE STUDY;  Surgeon: Shellia Cleverly, DO;  Location: [facility];  Service: Endoscopy;  Laterality: N/A;   PILONIDAL CYST EXCISION N/A 03/24/2017   Procedure: EXCISION CHRONIC  PILONIDAL ABSCESS;  Surgeon: [name] Miyamoto, MD;  Location: [facility];  Service: General;  Laterality: N/A;   SCROTAL EXPLORATION N/A 11/13/2020   Procedure: EXCISION OF SEBACEOUS CYSTS, SCROTUM;  Surgeon: Malen Gauze, MD;  Location: [facility];  Service: Urology;  Laterality: N/A;   TENDON REPAIR Left ~ 2004   "main tendon in my ankle"   Social History   Occupational History   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  Tobacco Use   Smoking status: Every Day    Current packs/day: 1.00    Average packs/day: 1 pack/day for 34.0 years (34.0 ttl pk-yrs)    Types: Cigarettes    Passive exposure: Never   Smokeless tobacco: Never   Tobacco comments:    1/2 pack a day  Vaping Use   Vaping status: Never Used  Substance and Sexual Activity   Alcohol use: Never    Alcohol/week: 0.0 standard drinks of alcohol   Drug use: Never   Sexual activity: Not Currently

## 2023-06-21 NOTE — Telephone Encounter (Signed)
Sw pt. Appt sch for Thursday at 8:45

## 2023-06-23 ENCOUNTER — Ambulatory Visit: Payer: MEDICAID | Admitting: Orthopedic Surgery

## 2023-06-23 ENCOUNTER — Other Ambulatory Visit: Payer: Self-pay

## 2023-06-23 ENCOUNTER — Encounter (HOSPITAL_COMMUNITY): Payer: Self-pay | Admitting: Orthopedic Surgery

## 2023-06-23 ENCOUNTER — Encounter: Payer: Self-pay | Admitting: Orthopedic Surgery

## 2023-06-23 DIAGNOSIS — I96 Gangrene, not elsewhere classified: Secondary | ICD-10-CM

## 2023-06-23 NOTE — Progress Notes (Signed)
Office Visit Note   Patient: [name] [name]           Date of Birth: 1970-06-29           MRN: [id number] Visit Date: 06/23/2023              Requested by: No referring provider defined for this encounter. PCP: Patient, No Pcp Per  Chief Complaint  Patient presents with   Left Foot - Wound Check      HPI: Patient is a 53 year old gentleman who is seen in follow-up for gangrene of the dorsum of the left foot and gangrene of the little toe.  Patient is status post endovascular revascularization.  Endovascular surgery January 20.  Assessment & Plan: Visit Diagnoses:  1. Gangrene of toe of left foot (HCC)     Plan: Will plan for a left foot fifth ray amputation.  Risk and benefits were discussed including risk of the wound not healing need for additional surgery.  Patient states he understands wished to proceed at this time plan for outpatient surgery tomorrow.  Anticipate we could proceed with MAC anesthetic and a local block.  Follow-Up Instructions: Return in about 1 week (around 06/30/2023).   Ortho Exam  Patient is alert, oriented, no adenopathy, well-dressed, normal affect, normal respiratory effort. Examination patient has an excellent dorsalis pedis and posterior tibial pulse with excellent revascularization.  The ischemic ulcer of the dorsum of the foot has healthy granulation tissue at this time and is 1 cm in diameter.  The dry gangrene of the left little toe is stable there is no ascending cellulitis no drainage.  Imaging: No results found.    Labs: Lab Results  Component Value Date   HGBA1C 9.0 (A) 04/11/2023   HGBA1C 13.2 (H) 08/29/2022   HGBA1C 12.9 (H) 08/28/2022   ESRSEDRATE 20 (H) 06/18/2022   ESRSEDRATE 1 08/20/2015   CRP <0.5 06/18/2022   CRP 2.5 01/23/2019   CRP <0.5 08/20/2015   REPTSTATUS 06/18/2023 FINAL 06/13/2023   CULT  06/13/2023    NO GROWTH 5 DAYS Performed at [facility], [address] [place], [place] [address]      Lab  Results  Component Value Date   ALBUMIN 3.2 (L) 08/27/2022   ALBUMIN 2.8 (L) 07/26/2022   ALBUMIN 4.1 07/06/2022    Lab Results  Component Value Date   MG 1.8 02/08/2023   MG 2.0 08/29/2022   MG 1.5 (L) 08/28/2022   No results found for: "VD25OH"  No results found for: "PREALBUMIN"    Latest Ref Rng & Units 06/20/2023    5:32 AM 06/13/2023    6:05 PM 02/08/2023    1:06 PM  CBC EXTENDED  WBC 4.0 - 10.5 K/uL  13.2  12.1   RBC 4.22 - 5.81 MIL/uL  4.59  4.71   Hemoglobin 13.0 - 17.0 g/dL 19.1  47.8  29.5   HCT 39.0 - 52.0 % 40.0  43.5  44.1   Platelets 150 - 400 K/uL  251  262   NEUT# 1.7 - 7.7 K/uL  9.7    Lymph# 0.7 - 4.0 K/uL  2.4       There is no height or weight on file to calculate BMI.  Orders:  No orders of the defined types were placed in this encounter.  No orders of the defined types were placed in this encounter.    Procedures: No procedures performed  Clinical Data: No additional findings.  ROS:  All  other systems negative, except as noted in the HPI. Review of Systems  Objective: Vital Signs: There were no vitals taken for this visit.  Specialty Comments:  No specialty comments available.  PMFS History: Patient Active Problem List   Diagnosis Date Noted   Type 2 diabetes mellitus with diabetic polyneuropathy, with long-term current use of insulin (HCC) 04/11/2023   Type 2 diabetes mellitus with hyperglycemia, with long-term current use of insulin (HCC) 04/11/2023   Diabetes mellitus (HCC) 04/11/2023   PTSD (post-traumatic stress disorder) 03/14/2023   Aortic valve sclerosis 09/16/2022   Hypoglycemia 08/27/2022   COPD GOLD 1/ AB 06/11/2022   Syncope and collapse 12/13/2021   Bifascicular block 12/13/2021   SIRS (systemic inflammatory response syndrome) (HCC) 12/13/2021   Renal insufficiency 12/13/2021   Esophageal motility disorder 12/13/2021   Heartburn    Scrotal abscess 11/19/2020   Severe sepsis (HCC) 09/03/2020   Non-adherence to  medical treatment 05/27/2020   Barrett's esophagus 01/23/2019   Diarrhea 01/23/2019   MDD (major depressive disorder), severe (HCC) 12/17/2018   DKA, type 2 (HCC) 11/07/2018   High anion gap metabolic acidosis 03/22/2018   Epigastric abdominal pain 03/22/2018   Nausea vomiting and diarrhea 03/22/2018   AKI (acute kidney injury) (HCC) 03/22/2018   Uncontrolled type 2 diabetes mellitus with hyperglycemia (HCC) 03/22/2018   ARF (acute renal failure) (HCC) 05/30/2017   Mixed hyperlipidemia 03/31/2017   Diabetic polyneuropathy (HCC) 03/31/2017   Vitamin B12 deficiency 03/31/2017   Vitamin D deficiency 03/31/2017   Right bundle branch block 02/14/2017   DKA (diabetic ketoacidosis) (HCC) 10/15/2016   Gastroesophageal reflux disease 10/15/2016   Benign hypertension 10/15/2016   Leukocytosis 11/25/2015   Intractable nausea and vomiting 11/25/2015   Uncontrolled type 2 diabetes mellitus with hypoglycemia, with long-term current use of insulin (HCC) 11/25/2015   Dental abscess 11/25/2015   Nausea and vomiting 11/25/2015   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 03/2015 Cath: LM nl, LAD mild diff dzs throughout w/ 50p/d, LCX diff dzs, 1m, RCA diff dzs, 38m-->Med rx; b. 12/2020 MV: EF 57%, small/mild apical inferior defect w/ partial rev/mild ischemia.   Chronic bronchitis (HCC)    Chronic upper back pain    COPD (chronic obstructive pulmonary disease) (HCC)    Depression    Diabetic peripheral neuropathy (HCC)    GERD (gastroesophageal reflux disease)    History of gout    Hyperlipemia    Hypertension    Migraine    Noncompliance    Ringing in the  ears, bilateral    Sleep apnea 2016   Type 2 diabetes mellitus (HCC)     Family History  Problem Relation Age of Onset   Diabetes Mother    Hypertension Mother    Cancer Mother    Throat cancer Mother    Diabetes Father    Hypertension Father    Hyperlipidemia Father    Congestive Heart Failure Sister    Colon cancer Neg Hx    Pancreatic cancer Neg Hx    Liver disease Neg Hx     Past Surgical History:  Procedure Laterality Date   ABDOMINAL AORTOGRAM  W/LOWER EXTREMITY N/A 06/20/2023   Procedure: ABDOMINAL AORTOGRAM W/LOWER EXTREMITY;  Surgeon: Daria Pastures, MD;  Location: [facility] INVASIVE CV LAB;  Service: Cardiovascular;  Laterality: N/A;   ANKLE SURGERY Right 1982   "had extra bones in there; took them out"   APPENDECTOMY  1975   BIOPSY  12/26/2018   Procedure: BIOPSY;  Surgeon: Malissa Hippo, MD;  Location: [facility];  Service: Endoscopy;;  duodenal biopsies   BIOPSY  03/23/2019   Procedure: BIOPSY;  Surgeon: Malissa Hippo, MD;  [facility]: [facility];  Service: Endoscopy;;  esophagus   CARDIAC CATHETERIZATION N/A 03/28/2015   Procedure: Left Heart Cath and Coronary Angiography;  Surgeon: Yates Decamp, MD;  Location: [facility] INVASIVE CV LAB;  Service: Cardiovascular;  Laterality: N/A;   CARDIAC CATHETERIZATION N/A 03/28/2015   Procedure: Intravascular Pressure Wire/FFR Study;  Surgeon: Yates Decamp, MD;  Location: [facility] INVASIVE CV LAB;  Service: Cardiovascular;  Laterality: N/A;   CARPAL TUNNEL RELEASE Left ~ 2008   COLONOSCOPY WITH ESOPHAGOGASTRODUODENOSCOPY (EGD)     COLONOSCOPY WITH PROPOFOL N/A 12/24/2019   Procedure: COLONOSCOPY WITH PROPOFOL;  Surgeon: Malissa Hippo, MD;  Location: [facility];  Service: Endoscopy;  Laterality: N/A;  955   ELBOW FRACTURE SURGERY Left ~ 2008   ESOPHAGEAL MANOMETRY N/A 09/16/2021   Procedure: ESOPHAGEAL MANOMETRY (EM);  Surgeon: Shellia Cleverly, DO;  Location: [facility];  Service: Endoscopy;  Laterality: N/A;    ESOPHAGOGASTRODUODENOSCOPY N/A 12/26/2018   Procedure: ESOPHAGOGASTRODUODENOSCOPY (EGD);  Surgeon: Malissa Hippo, MD;  Location: [facility];  Service: Endoscopy;  Laterality: N/A;   ESOPHAGOGASTRODUODENOSCOPY (EGD) WITH PROPOFOL N/A 03/23/2019   Procedure: ESOPHAGOGASTRODUODENOSCOPY (EGD) WITH PROPOFOL;  Surgeon: Malissa Hippo, MD;  Location: [facility];  Service: Endoscopy;  Laterality: N/A;  7:30   FRACTURE SURGERY     ankle and elbow   PERIPHERAL VASCULAR BALLOON ANGIOPLASTY  06/20/2023   Procedure: PERIPHERAL VASCULAR BALLOON ANGIOPLASTY;  Surgeon: Daria Pastures, MD;  Location: [facility] INVASIVE CV LAB;  Service: Cardiovascular;;   PH IMPEDANCE STUDY N/A 09/16/2021   Procedure: PH IMPEDANCE STUDY;  Surgeon: Shellia Cleverly, DO;  Location: [facility];  Service: Endoscopy;  Laterality: N/A;   PILONIDAL CYST EXCISION N/A 03/24/2017   Procedure: EXCISION CHRONIC  PILONIDAL ABSCESS;  Surgeon: [name] Miyamoto, MD;  Location: [facility];  Service: General;  Laterality: N/A;   SCROTAL EXPLORATION N/A 11/13/2020   Procedure: EXCISION OF SEBACEOUS CYSTS, SCROTUM;  Surgeon: Malen Gauze, MD;  Location: [facility];  Service: Urology;  Laterality: N/A;   TENDON REPAIR Left ~ 2004   "main tendon in my ankle"   Social History   Occupational History   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  Tobacco Use   Smoking status: Every Day    Current packs/day: 1.00    Average packs/day: 1 pack/day for 34.0 years (34.0 ttl pk-yrs)    Types: Cigarettes    Passive exposure: Never   Smokeless tobacco: Never   Tobacco comments:    1/2 pack a day  Vaping Use   Vaping status: Never Used  Substance and Sexual Activity   Alcohol use: Never    Alcohol/week: 0.0 standard drinks of alcohol   Drug use: Never   Sexual activity: Not Currently

## 2023-06-23 NOTE — Progress Notes (Signed)
Anesthesia Chart Review: Same day workup  53 year old male follows with cardiology for history of orthostatic hypotension on midodrine, HLD, CAD (moderate nonobstructive LAD stenosis 2016, follow-up Myoview 2022 suggested mild inferior apical ischemic territory which was managed medically).  Most recent echo 07/2022 showed EF 55 to 60%.  Last seen by Dr. Diona Browner 05/10/2023.  Patient reported no angina at that time and no nitroglycerin use.  No changes made to management, follow-up in 6 months.  Follows with vascular surgery for history of PAD.  Recently underwent drug-coated balloon angioplasty of left popliteal artery occlusion on 06/20/2023.  Patient subsequent seen by Dr. 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  Left foot fifth ray amputation was recommended.  Other pertinent history includes current smoker associated COPD (maintained on Symbicort), OSA not on CPAP, GERD, IDDM 2 (A1c 9.0 on 04/11/2023., renal insufficiency by labs.  I-STAT from 06/20/2023 reviewed, creatinine moderately elevated at 1.60 (appears to be near recent baseline), otherwise unremarkable.  Patient will need day of surgery labs and evaluation.  EKG 02/08/23:  Normal sinus rhythm. Rate 96. Right bundle branch block. Left anterior fascicular block  TTE 08/28/22: 1. Left ventricular ejection fraction, by estimation, is 55 to 60%. The  left ventricle has normal function. The left ventricle has no regional  wall motion abnormalities. There is mild concentric left ventricular  hypertrophy. Left ventricular diastolic  parameters are consistent with Grade I diastolic dysfunction (impaired  relaxation).   2. Right ventricular systolic function is normal. The right ventricular  size is normal. Tricuspid regurgitation signal is inadequate for assessing  PA pressure.   3. The mitral valve leaflets appear mildly thickened for age. . The  mitral valve is abnormal. No evidence  of mitral valve regurgitation. No  evidence of mitral stenosis.   4. The aortic valve is tricuspid. Aortic valve regurgitation is not  visualized. Aortic valve sclerosis is present, with no evidence of aortic  valve stenosis.   5. The inferior vena cava is dilated in size with <50% respiratory  variability, suggesting right atrial pressure of 15 mmHg.   Nuclear stress 01/13/21: No diagnostic ST segment changes to indicate ischemia. Small, mild intensity, apical inferior defect that is partially reversible and consistent with a mild ischemic territory. This is a low risk study. Nuclear stress EF: 57%   Zannie Cove [facility] Short Stay Center/Anesthesiology Phone [phone] 06/23/2023 10:45 AM

## 2023-06-23 NOTE — Progress Notes (Signed)
SDW CALL  Patient was given pre-op instructions over the phone. The opportunity was given for the patient to ask questions. No further questions asked. Patient verbalized understanding of instructions given.   PCP - [facility] Cardiologist - Nona Dell  PPM/ICD - denies Device Orders - n/a Rep Notified - n/a  Chest x-ray - denies EKG - 02/09/23 Stress Test - 01/13/21 ECHO - 08/28/22 Cardiac Cath - 2016  Sleep Study - OSA+ CPAP - does not wear a cpap  Fasting Blood Sugar - 120-125 Checks Blood Sugar - wears a Freestyle Libre and checks 4 times a day  Last dose of GLP1 agonist-  n/a GLP1 instructions: n/a  Blood Thinner Instructions:  last dose of Plavix 1/23 Aspirin Instructions: last dose of Aspirin 1/23  ERAS Protcol - clears until 0625 PRE-SURGERY Ensure or G2- n/a  COVID TEST- n/a   Anesthesia review: yes  Patient denies shortness of breath, fever, cough and chest pain over the phone call   All instructions explained to the patient, with a verbal understanding of the material. Patient agrees to go over the instructions while at home for a better understanding.

## 2023-06-23 NOTE — Progress Notes (Signed)
Surgical Instructions   Your procedure is scheduled on Friday, January 24th, 2025. Report to [facility] Main Entrance "A" at 6:55 A.M., then check in with the Admitting office. Any questions or running late day of surgery: call [phone]  Questions prior to your surgery date: call [phone], Monday-Friday, 8am-4pm. If you experience any cold or flu symptoms such as cough, fever, chills, shortness of breath, etc. between now and your scheduled surgery, please notify us at the above number.     Remember:  Do not eat after midnight the night before your surgery   You may drink clear liquids until 6:25 the morning of your surgery.   Clear liquids allowed are: Water, Non-Citrus Juices (without pulp), Carbonated Beverages, Clear Tea (no milk, honey, etc.), Black Coffee Only (NO MILK, CREAM OR POWDERED CREAMER of any kind), and Gatorade.    Take these medicines the morning of surgery with A SIP OF WATER: Amlodipine (Norvasc) Aripiprazole (Abilify) Atorvastatin (Lipitor) Symbicort Inhaler Doxycycline  Famotidine (Pepcid) Ezetimibe (Zetia) Midodrine (Promatine)     May take these medicines IF NEEDED: Acetaminophen (Tylenol) Albuterol Inhaler (please bring with you) Gabapentin (Neurontin) Prochlorperazine (Compazine) Promethazine (Phenergan)   One week prior to surgery, STOP taking any Aspirin (unless otherwise instructed by your surgeon) Aleve, Naproxen, Ibuprofen, Motrin, Advil, Goody's, BC's, all herbal medications, fish oil, and non-prescription vitamins.   WHAT DO I DO ABOUT MY DIABETES MEDICATION?   Do not take oral diabetes medicines (pills) the morning of surgery.  Do NOT take Metformin on the day of surgery.   THE MORNING OF SURGERY, take __7__ units of _Lantus__insulin.   If your CBG is greater than 220 mg/dL, you may take  of your sliding scale (correction) dose of insulin.   HOW TO MANAGE YOUR DIABETES BEFORE AND AFTER SURGERY  Why is it important to  control my blood sugar before and after surgery? Improving blood sugar levels before and after surgery helps healing and can limit problems. A way of improving blood sugar control is eating a healthy diet by:  Eating less sugar and carbohydrates  Increasing activity/exercise  Talking with your doctor about reaching your blood sugar goals High blood sugars (greater than 180 mg/dL) can raise your risk of infections and slow your recovery, so you will need to focus on controlling your diabetes during the weeks before surgery. Make sure that the doctor who takes care of your diabetes knows about your planned surgery including the date and location.  How do I manage my blood sugar before surgery? Check your blood sugar at least 4 times a day, starting 2 days before surgery, to make sure that the level is not too high or low.  Check your blood sugar the morning of your surgery when you wake up and every 2 hours until you get to the Short Stay unit.  If your blood sugar is less than 70 mg/dL, you will need to treat for low blood sugar: Do not take insulin. Treat a low blood sugar (less than 70 mg/dL) with  cup of clear juice (cranberry or apple), 4 glucose tablets, OR glucose gel. Recheck blood sugar in 15 minutes after treatment (to make sure it is greater than 70 mg/dL). If your blood sugar is not greater than 70 mg/dL on recheck, call [phone] for further instructions. Report your blood sugar to the short stay nurse when you get to Short Stay.  If you are admitted to the hospital after surgery: Your blood sugar will be checked by the  staff and you will probably be given insulin after surgery (instead of oral diabetes medicines) to make sure you have good blood sugar levels. The goal for blood sugar control after surgery is 80-180 mg/dL.                      Do NOT Smoke (Tobacco/Vaping) for 24 hours prior to your procedure.  If you use a CPAP at night, you may bring your mask/headgear  for your overnight stay.   You will be asked to remove any contacts, glasses, piercing's, hearing aid's, dentures/partials prior to surgery. Please bring cases for these items if needed.    Patients discharged the day of surgery will not be allowed to drive home, and someone needs to stay with them for 24 hours.  SURGICAL WAITING ROOM VISITATION Patients may have no more than 2 support people in the waiting area - these visitors may rotate.   Pre-op nurse will coordinate an appropriate time for 1 ADULT support person, who may not rotate, to accompany patient in pre-op.  Children under the age of 22 must have an adult with them who is not the patient and must remain in the main waiting area with an adult.  If the patient needs to stay at the hospital during part of their recovery, the visitor guidelines for inpatient rooms apply.  Please refer to the [facility] website for the visitor guidelines for any additional information.   If you received a COVID test during your pre-op visit  it is requested that you wear a mask when out in public, stay away from anyone that may not be feeling well and notify your surgeon if you develop symptoms. If you have been in contact with anyone that has tested positive in the last 10 days please notify you surgeon.      Additional instructions for the day of surgery: DO NOT APPLY any lotions, deodorants, cologne, or perfumes.   Do not wear jewelry or makeup Do not wear nail polish, gel polish, artificial nails, or any other type of covering on natural nails (fingers and toes) Do not bring valuables to the hospital. [facility] is not responsible for valuables/personal belongings. Put on clean/comfortable clothes.  Please brush your teeth.  Ask your nurse before applying any prescription medications to the skin.

## 2023-06-23 NOTE — Anesthesia Preprocedure Evaluation (Addendum)
Anesthesia Evaluation  Patient identified by MRN, date of birth, ID band Patient awake    Reviewed: Allergy & Precautions, NPO status , Patient's Chart, lab work & pertinent test results  Airway Mallampati: II  TM Distance: >3 FB Neck ROM: Full    Dental  (+) Dental Advisory Given   Pulmonary asthma , sleep apnea , COPD, Current Smoker   breath sounds clear to auscultation       Cardiovascular hypertension, Pt. on medications + angina  + CAD  + dysrhythmias  Rhythm:Regular Rate:Normal     Neuro/Psych  Headaches  Neuromuscular disease    GI/Hepatic Neg liver ROS,GERD  ,,  Endo/Other  diabetes, Type 2    Renal/GU CRFRenal disease     Musculoskeletal  (+) Arthritis ,    Abdominal   Peds  Hematology negative hematology ROS (+)   Anesthesia Other Findings   Reproductive/Obstetrics                             Anesthesia Physical Anesthesia Plan  ASA: 3  Anesthesia Plan: MAC   Post-op Pain Management: Tylenol PO (pre-op)*   Induction:   PONV Risk Score and Plan: 0 and Propofol infusion, Midazolam and Ondansetron  Airway Management Planned: Natural Airway and Simple Face Mask  Additional Equipment: None  Intra-op Plan:   Post-operative Plan:   Informed Consent: I have reviewed the patients History and Physical, chart, labs and discussed the procedure including the risks, benefits and alternatives for the proposed anesthesia with the patient or authorized representative who has indicated his/her understanding and acceptance.     Dental advisory given  Plan Discussed with:   Anesthesia Plan Comments: (PAT note by Antionette Poles, PA-C:" 53 year old male follows with cardiology for history of orthostatic hypotension on midodrine, HLD, CAD (moderate nonobstructive LAD stenosis 2016, follow-up Myoview 2022 suggested mild inferior apical ischemic territory which was managed medically).   Most recent echo 07/2022 showed EF 55 to 60%.  Last seen by Dr. Diona Browner 05/10/2023.  Patient reported no angina at that time and no nitroglycerin use.  No changes made to management, follow-up in 6 months.  Follows with vascular surgery for history of PAD.  Recently underwent drug-coated balloon angioplasty of left popliteal artery occlusion on 06/20/2023.  Patient subsequent seen by Dr. 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  Left foot fifth ray amputation was recommended.  Other pertinent history includes current smoker associated COPD (maintained on Symbicort), OSA not on CPAP, GERD, IDDM 2 (A1c 9.0 on 04/11/2023., renal insufficiency by labs.  I-STAT from 06/20/2023 reviewed, creatinine moderately elevated at 1.60 (appears to be near recent baseline), otherwise unremarkable.  Patient will need day of surgery labs and evaluation.  EKG 02/08/23:  Normal sinus rhythm. Rate 96. Right bundle branch block. Left anterior fascicular block  TTE 08/28/22: 1. Left ventricular ejection fraction, by estimation, is 55 to 60%. The  left ventricle has normal function. The left ventricle has no regional  wall motion abnormalities. There is mild concentric left ventricular  hypertrophy. Left ventricular diastolic  parameters are consistent with Grade I diastolic dysfunction (impaired  relaxation).  2. Right ventricular systolic function is normal. The right ventricular  size is normal. Tricuspid regurgitation signal is inadequate for assessing  PA pressure.  3. The mitral valve leaflets appear mildly thickened for age. . The  mitral valve is abnormal. No evidence of  mitral valve regurgitation. No  evidence of mitral stenosis.  4. The aortic valve is tricuspid. Aortic valve regurgitation is not  visualized. Aortic valve sclerosis is present, with no evidence of aortic  valve stenosis.  5. The inferior vena cava is dilated in size with <50%  respiratory  variability, suggesting right atrial pressure of 15 mmHg.   Nuclear stress 01/13/21:  No diagnostic ST segment changes to indicate ischemia.  Small, mild intensity, apical inferior defect that is partially reversible and consistent with a mild ischemic territory.  This is a low risk study.  Nuclear stress EF: 57%   )        Anesthesia Quick Evaluation

## 2023-06-24 ENCOUNTER — Encounter (HOSPITAL_COMMUNITY)
Admission: RE | Disposition: A | Discharge status: Home / Self Care | Payer: Self-pay | Source: Home / Self Care | Attending: Orthopedic Surgery

## 2023-06-24 ENCOUNTER — Ambulatory Visit (HOSPITAL_COMMUNITY): Payer: MEDICAID | Admitting: Physician Assistant

## 2023-06-24 ENCOUNTER — Encounter (HOSPITAL_COMMUNITY): Payer: Self-pay | Admitting: Orthopedic Surgery

## 2023-06-24 ENCOUNTER — Other Ambulatory Visit: Payer: Self-pay

## 2023-06-24 ENCOUNTER — Ambulatory Visit (HOSPITAL_COMMUNITY)
Admission: RE | Admit: 2023-06-24 | Discharge: 2023-06-24 | Disposition: A | Discharge status: Home / Self Care | Payer: MEDICAID | Attending: Orthopedic Surgery | Admitting: Orthopedic Surgery

## 2023-06-24 DIAGNOSIS — J45909 Unspecified asthma, uncomplicated: Secondary | ICD-10-CM

## 2023-06-24 DIAGNOSIS — Z7984 Long term (current) use of oral hypoglycemic drugs: Secondary | ICD-10-CM | POA: Insufficient documentation

## 2023-06-24 DIAGNOSIS — I96 Gangrene, not elsewhere classified: Secondary | ICD-10-CM | POA: Diagnosis not present

## 2023-06-24 DIAGNOSIS — F17210 Nicotine dependence, cigarettes, uncomplicated: Secondary | ICD-10-CM | POA: Insufficient documentation

## 2023-06-24 DIAGNOSIS — I10 Essential (primary) hypertension: Secondary | ICD-10-CM | POA: Insufficient documentation

## 2023-06-24 DIAGNOSIS — I25119 Atherosclerotic heart disease of native coronary artery with unspecified angina pectoris: Secondary | ICD-10-CM | POA: Diagnosis not present

## 2023-06-24 DIAGNOSIS — G4730 Sleep apnea, unspecified: Secondary | ICD-10-CM | POA: Diagnosis not present

## 2023-06-24 DIAGNOSIS — E1152 Type 2 diabetes mellitus with diabetic peripheral angiopathy with gangrene: Secondary | ICD-10-CM | POA: Insufficient documentation

## 2023-06-24 DIAGNOSIS — Z794 Long term (current) use of insulin: Secondary | ICD-10-CM | POA: Diagnosis not present

## 2023-06-24 DIAGNOSIS — I2510 Atherosclerotic heart disease of native coronary artery without angina pectoris: Secondary | ICD-10-CM

## 2023-06-24 DIAGNOSIS — J4489 Other specified chronic obstructive pulmonary disease: Secondary | ICD-10-CM | POA: Diagnosis not present

## 2023-06-24 DIAGNOSIS — K219 Gastro-esophageal reflux disease without esophagitis: Secondary | ICD-10-CM | POA: Diagnosis not present

## 2023-06-24 HISTORY — PX: AMPUTATION: SHX166

## 2023-06-24 LAB — SURGICAL PCR SCREEN
MRSA, PCR: NEGATIVE
Staphylococcus aureus: NEGATIVE

## 2023-06-24 LAB — GLUCOSE, CAPILLARY
Glucose-Capillary: 219 mg/dL — ABNORMAL HIGH (ref 70–99)
Glucose-Capillary: 115 mg/dL — ABNORMAL HIGH (ref 70–99)
Glucose-Capillary: 75 mg/dL (ref 70–99)

## 2023-06-24 SURGERY — AMPUTATION DIGIT
Anesthesia: Monitor Anesthesia Care | Laterality: Left

## 2023-06-24 MED ORDER — LACTATED RINGERS IV SOLN
INTRAVENOUS | Status: DC
Start: 2023-06-24 — End: 2023-06-24

## 2023-06-24 MED ORDER — PHENYLEPHRINE HCL-NACL 20-0.9 MG/250ML-% IV SOLN
INTRAVENOUS | Status: AC
  Filled 2023-06-24: qty 250

## 2023-06-24 MED ORDER — MIDAZOLAM HCL 2 MG/2ML IJ SOLN
INTRAMUSCULAR | Status: AC
  Filled 2023-06-24: qty 2

## 2023-06-24 MED ORDER — CEFAZOLIN SODIUM-DEXTROSE 2-4 GM/100ML-% IV SOLN
2.0000 g | INTRAVENOUS | Status: AC
Start: 2023-06-24 — End: 2023-06-24
  Administered 2023-06-24: 2 g via INTRAVENOUS
  Filled 2023-06-24: qty 100

## 2023-06-24 MED ORDER — INSULIN ASPART 100 UNIT/ML IJ SOLN
0.0000 [IU] | INTRAMUSCULAR | Status: DC | PRN
  Administered 2023-06-24: 4 [IU] via SUBCUTANEOUS
  Filled 2023-06-24: qty 1

## 2023-06-24 MED ORDER — LIDOCAINE HCL (PF) 1 % IJ SOLN
INTRAMUSCULAR | Status: DC | PRN
  Administered 2023-06-24: 30 mL

## 2023-06-24 MED ORDER — LIDOCAINE HCL (PF) 1 % IJ SOLN
INTRAMUSCULAR | Status: AC
  Filled 2023-06-24: qty 30

## 2023-06-24 MED ORDER — FENTANYL CITRATE (PF) 250 MCG/5ML IJ SOLN
INTRAMUSCULAR | Status: AC
  Filled 2023-06-24: qty 5

## 2023-06-24 MED ORDER — CHLORHEXIDINE GLUCONATE 0.12 % MT SOLN
15.0000 mL | Freq: Once | OROMUCOSAL | Status: AC
  Administered 2023-06-24: 15 mL via OROMUCOSAL
  Filled 2023-06-24: qty 15

## 2023-06-24 MED ORDER — ORAL CARE MOUTH RINSE
15.0000 mL | Freq: Once | OROMUCOSAL | Status: AC
Start: 2023-06-24 — End: 2023-06-24

## 2023-06-24 MED ORDER — MIDAZOLAM HCL 2 MG/2ML IJ SOLN
INTRAMUSCULAR | Status: DC | PRN
  Administered 2023-06-24: 2 mg via INTRAVENOUS

## 2023-06-24 MED ORDER — FENTANYL CITRATE (PF) 250 MCG/5ML IJ SOLN
INTRAMUSCULAR | Status: DC | PRN
  Administered 2023-06-24: 50 ug via INTRAVENOUS

## 2023-06-24 MED ORDER — PROPOFOL 1000 MG/100ML IV EMUL
INTRAVENOUS | Status: AC
  Filled 2023-06-24: qty 200

## 2023-06-24 MED ORDER — HYDROCODONE-ACETAMINOPHEN 5-325 MG PO TABS: 1.0000 | ORAL_TABLET | ORAL | 0 refills | Status: DC | PRN

## 2023-06-24 MED ORDER — PROPOFOL 500 MG/50ML IV EMUL
INTRAVENOUS | Status: DC | PRN
  Administered 2023-06-24: 125 ug/kg/min via INTRAVENOUS

## 2023-06-24 SURGICAL SUPPLY — 28 items
BAG COUNTER SPONGE SURGICOUNT (BAG) ×2 IMPLANT
BLADE SURG 21 STRL SS (BLADE) ×2 IMPLANT
BNDG COHESIVE 4X5 TAN STRL (GAUZE/BANDAGES/DRESSINGS) ×2 IMPLANT
BNDG COHESIVE 4X5 TAN STRL LF (GAUZE/BANDAGES/DRESSINGS) IMPLANT
BNDG ESMARK 4X9 LF (GAUZE/BANDAGES/DRESSINGS) IMPLANT
BNDG GAUZE DERMACEA FLUFF 4 (GAUZE/BANDAGES/DRESSINGS) ×2 IMPLANT
COVER SURGICAL LIGHT HANDLE (MISCELLANEOUS) ×4 IMPLANT
DRAPE U-SHAPE 47X51 STRL (DRAPES) ×2 IMPLANT
DRSG ADAPTIC 3X8 NADH LF (GAUZE/BANDAGES/DRESSINGS) IMPLANT
DURAPREP 26ML APPLICATOR (WOUND CARE) ×2 IMPLANT
ELECT REM PT RETURN 9FT ADLT (ELECTROSURGICAL) ×1
ELECTRODE REM PT RTRN 9FT ADLT (ELECTROSURGICAL) ×2 IMPLANT
GAUZE PAD ABD 8X10 STRL (GAUZE/BANDAGES/DRESSINGS) ×2 IMPLANT
GAUZE SPONGE 4X4 12PLY STRL (GAUZE/BANDAGES/DRESSINGS) IMPLANT
GLOVE BIOGEL PI IND STRL 9 (GLOVE) ×2 IMPLANT
GLOVE SURG ORTHO 9.0 STRL STRW (GLOVE) ×2 IMPLANT
GOWN STRL REUS W/ TWL XL LVL3 (GOWN DISPOSABLE) ×4 IMPLANT
KIT BASIN OR (CUSTOM PROCEDURE TRAY) ×2 IMPLANT
KIT TURNOVER KIT B (KITS) ×2 IMPLANT
MANIFOLD NEPTUNE II (INSTRUMENTS) ×2 IMPLANT
NDL 22X1.5 STRL (OR ONLY) (MISCELLANEOUS) IMPLANT
NEEDLE 22X1.5 STRL (OR ONLY) (MISCELLANEOUS) ×1
NS IRRIG 1000ML POUR BTL (IV SOLUTION) ×2 IMPLANT
PACK ORTHO EXTREMITY (CUSTOM PROCEDURE TRAY) ×2 IMPLANT
PAD ARMBOARD 7.5X6 YLW CONV (MISCELLANEOUS) ×4 IMPLANT
SUT ETHILON 2 0 PSLX (SUTURE) ×2 IMPLANT
SYR CONTROL 10ML LL (SYRINGE) IMPLANT
TOWEL GREEN STERILE (TOWEL DISPOSABLE) ×2 IMPLANT

## 2023-06-24 NOTE — Progress Notes (Signed)
Orthopedic Tech Progress Note Patient Details:  [name] [name] May 14, 1971 [id number]  Ortho Devices Type of Ortho Device: Crutches Ortho Device/Splint Interventions: Ordered, Application, Adjustment  Left at bedside with RN. Pt. Stated he was comfortable with walking with crutches. Post Interventions Patient Tolerated: Other (comment) Instructions Provided: Adjustment of device, Other (comment)  [name] 06/24/2023, 10:25 AM

## 2023-06-24 NOTE — Op Note (Signed)
06/24/2023  9:50 AM  PATIENT:  [name] [name]    PRE-OPERATIVE DIAGNOSIS:  Gangrene Left 5th Toe  POST-OPERATIVE DIAGNOSIS:  Same  PROCEDURE:  LEFT 5TH RAY AMPUTATION  SURGEON:  Nadara Mustard, MD  PHYSICIAN ASSISTANT:None ANESTHESIA:   General  PREOPERATIVE INDICATIONS:  [name] [name] is a  53 y.o. male with a diagnosis of Gangrene Left 5th Toe who failed conservative measures and elected for surgical management.    The risks benefits and alternatives were discussed with the patient preoperatively including but not limited to the risks of infection, bleeding, nerve injury, cardiopulmonary complications, the need for revision surgery, among others, and the patient was willing to proceed.  OPERATIVE IMPLANTS:   * No implants in log *  @ENCIMAGES @  OPERATIVE FINDINGS: Patient had good petechial bleeding.  Tissue margins were clear.  OPERATIVE PROCEDURE: Patient brought the operating room and underwent a MAC anesthetic.  The left lower extremity was then prepped using DuraPrep draped into a sterile field a timeout was called.  Patient underwent a digital block with 10 cc of 1% lidocaine plain.  After adequate levels anesthesia were obtained elliptical incision was made proximal to the gangrenous necrotic skin the toe was amputated through the MTP joint.  Electrocautery was used for hemostasis.  There was insufficient soft tissue for wound closure secondary to the margin of the gangrenous skin and the amputation was then carried down through the metatarsal neck.  This allowed for adequate soft tissue for closure.  Wound was irrigated with normal saline.  Incision closed using 2-0 nylon a sterile dressing was applied patient was taken the PACU in stable condition.   DISCHARGE PLANNING:  Antibiotic duration: Preoperative antibiotics  Weightbearing: Touchdown weightbearing on the left  Pain medication: Prescription for Vicodin  Dressing care/ Wound VAC: Dry dressing  Ambulatory  devices: Crutches and postoperative shoe  Discharge to: Home.  Follow-up: In the office 1 week post operative.

## 2023-06-24 NOTE — Transfer of Care (Signed)
Immediate Anesthesia Transfer of Care Note  Patient: [name] [name]  Procedure(s) Performed: LEFT 5TH TOE AMPUTATION (Left)  Patient Location: PACU  Anesthesia Type:MAC  Level of Consciousness: awake, alert , and oriented  Airway & Oxygen Therapy: Patient Spontanous Breathing and Patient connected to face mask oxygen  Post-op Assessment: Report given to RN and Post -op Vital signs reviewed and stable  Post vital signs: Reviewed and stable  Last Vitals:  Vitals Value Taken Time  BP 97/64 06/24/23 0958  Temp    Pulse 79 06/24/23 1001  Resp 30 06/24/23 1001  SpO2 93 % 06/24/23 1001  Vitals shown include unfiled device data.  Last Pain:  Vitals:   06/24/23 0708  TempSrc:   PainSc: 5       Patients Stated Pain Goal: 4 (06/24/23 0708)  Complications: No notable events documented.

## 2023-06-24 NOTE — H&P (Signed)
[name] [name] is an 53 y.o. male.   Chief Complaint: Gangrenous ulcer left foot fifth toe HPI: Patient is a 53 year old gentleman who is seen in follow-up for gangrene of the dorsum of the left foot and gangrene of the little toe. Patient is status post endovascular revascularization. Endovascular surgery January 20.   Past Medical History:  Diagnosis Date   Anxiety    Arthritis    Asthma    CAD (coronary artery disease)    a. 03/2015 Cath: LM nl, LAD mild diff dzs throughout w/ 50p/d, LCX diff dzs, 38m, RCA diff dzs, 53m-->Med rx; b. 12/2020 MV: EF 57%, small/mild apical inferior defect w/ partial rev/mild ischemia.   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, bilateral    Sleep apnea 2016   Type 2 diabetes mellitus (HCC)     Past Surgical History:  Procedure Laterality Date   ABDOMINAL AORTOGRAM W/LOWER EXTREMITY N/A 06/20/2023   Procedure: ABDOMINAL AORTOGRAM W/LOWER EXTREMITY;  Surgeon: Daria Pastures, MD;  Location: [facility];  Service: Cardiovascular;  Laterality: N/A;   ANKLE SURGERY Right 1982   "had extra bones in there; took them out"   APPENDECTOMY  1975   BIOPSY  12/26/2018   Procedure: BIOPSY;  Surgeon: Malissa Hippo, MD;  Location: [facility];  Service: Endoscopy;;  duodenal biopsies   BIOPSY  03/23/2019   Procedure: BIOPSY;  Surgeon: Malissa Hippo, MD;  [facility]: [facility];  Service: Endoscopy;;  esophagus   CARDIAC CATHETERIZATION N/A 03/28/2015   Procedure: Left Heart Cath and Coronary Angiography;  Surgeon: Yates Decamp, MD;  Location: [facility] CV [facility];  Service: Cardiovascular;  Laterality: N/A;   CARDIAC CATHETERIZATION N/A 03/28/2015   Procedure: Intravascular Pressure  Wire/FFR Study;  Surgeon: Yates Decamp, MD;  Location: [facility] INVASIVE CV LAB;  Service: Cardiovascular;  Laterality: N/A;   CARPAL TUNNEL RELEASE Left ~ 2008   COLONOSCOPY WITH ESOPHAGOGASTRODUODENOSCOPY (EGD)     COLONOSCOPY WITH PROPOFOL N/A 12/24/2019   Procedure: COLONOSCOPY WITH PROPOFOL;  Surgeon: Malissa Hippo, MD;  [facility]: [facility];  Service: Endoscopy;  Laterality: N/A;  955   ELBOW FRACTURE SURGERY Left ~ 2008   ESOPHAGEAL MANOMETRY N/A 09/16/2021   Procedure: ESOPHAGEAL MANOMETRY (EM);  Surgeon: Shellia Cleverly, DO;  Location: [facility];  Service: Endoscopy;  Laterality: N/A;   ESOPHAGOGASTRODUODENOSCOPY N/A 12/26/2018   Procedure: ESOPHAGOGASTRODUODENOSCOPY (EGD);  Surgeon: Malissa Hippo, MD;  [facility];  Service: Endoscopy;  Laterality: N/A;   ESOPHAGOGASTRODUODENOSCOPY (EGD) WITH PROPOFOL N/A 03/23/2019   Procedure: ESOPHAGOGASTRODUODENOSCOPY (EGD) WITH PROPOFOL;  Surgeon: Malissa Hippo, MD;  [facility];  Service: Endoscopy;  Laterality: N/A;  7:30   FRACTURE SURGERY     ankle and elbow   PERIPHERAL VASCULAR BALLOON ANGIOPLASTY  06/20/2023   Procedure: PERIPHERAL VASCULAR BALLOON ANGIOPLASTY;  Surgeon: Daria Pastures, MD;  Location: [facility];  Service: Cardiovascular;;   PH IMPEDANCE STUDY N/A 09/16/2021   Procedure: PH IMPEDANCE STUDY;  Surgeon: Shellia Cleverly, DO;  Location: [facility];  Service: Endoscopy;  Laterality: N/A;   PILONIDAL CYST EXCISION N/A 03/24/2017  Procedure: EXCISION CHRONIC  PILONIDAL ABSCESS;  Surgeon: [name] Miyamoto, MD;  Location: [facility];  Service: General;  Laterality: N/A;   SCROTAL EXPLORATION N/A 11/13/2020   Procedure: EXCISION OF SEBACEOUS CYSTS, SCROTUM;  Surgeon: Malen Gauze, MD;  Location: [facility];  Service: Urology;  Laterality: N/A;   TENDON REPAIR Left ~ 2004   "main tendon in my ankle"    Family History  Problem Relation Age of Onset   Diabetes Mother    Hypertension  Mother    Cancer Mother    Throat cancer Mother    Diabetes Father    Hypertension Father    Hyperlipidemia Father    Congestive Heart Failure Sister    Colon cancer Neg Hx    Pancreatic cancer Neg Hx    Liver disease Neg Hx    Social History:  reports that he has been smoking cigarettes. He has a 34 pack-year smoking history. He has never been exposed to tobacco smoke. He has never used smokeless tobacco. He reports that he does not drink alcohol and does not use drugs.  Allergies:  Allergies  Allergen Reactions   Prilosec Otc [Omeprazole Magnesium] Swelling    Face swells, no breathing impairment   Bee Venom Swelling   Morphine     Itching at IV site,  N+V   Nexium [Esomeprazole] Swelling    Face swells, no breathing impairment    Medications Prior to Admission  Medication Sig Dispense Refill   acetaminophen (TYLENOL) 500 MG tablet Take 1,000 mg by mouth 2 (two) times daily as needed for moderate pain, fever or headache.     albuterol (VENTOLIN HFA) 108 (90 Base) MCG/ACT inhaler Inhale 2 puffs into the lungs every 6 (six) hours as needed for wheezing or shortness of breath.     ALPRAZolam (XANAX) 1 MG tablet Take 1 mg by mouth at bedtime as needed for anxiety or sleep.     amLODipine (NORVASC) 5 MG tablet Take 5 mg by mouth every evening.     ARIPiprazole (ABILIFY) 10 MG tablet Take 10 mg by mouth daily.     aspirin EC 81 MG tablet Take 81 mg by mouth daily with breakfast.     atorvastatin (LIPITOR) 80 MG tablet TAKE 1 Tablet BY MOUTH ONCE EVERY DAY (Patient taking differently: Take 80 mg by mouth daily.) 90 tablet 0   budesonide-formoterol (SYMBICORT) 80-4.5 MCG/ACT inhaler Take 2 puffs first thing in am and then another 2 puffs about 12 hours later. (Patient taking differently: Inhale 2 puffs into the lungs 2 (two) times daily as needed (wheeze, shortness of breath).) 1 each 12   clopidogrel (PLAVIX) 75 MG tablet Take 1 tablet (75 mg total) by mouth daily. 30 tablet 11    doxycycline (VIBRAMYCIN) 100 MG capsule Take 1 capsule (100 mg total) by mouth 2 (two) times daily. 20 capsule 0   ezetimibe (ZETIA) 10 MG tablet Take 1 tablet (10 mg total) by mouth daily. (Patient taking differently: Take 10 mg by mouth every evening.) 90 tablet 3   famotidine (PEPCID) 40 MG tablet Take 1 tablet (40 mg total) by mouth 2 (two) times daily. (Patient taking differently: Take 40 mg by mouth 2 (two) times daily as needed for indigestion or heartburn.) 60 tablet 3   gabapentin (NEURONTIN) 300 MG capsule Take 1 capsule (300 mg total) by mouth every 8 (eight) hours as needed. (Patient taking differently: Take 300 mg by mouth See admin instructions. Take 1 capsule (300 mg) by mouth  in the morning & take 2 capsules (600 mg) by mouth at night.) 30 capsule 0   insulin aspart (NOVOLOG FLEXPEN) 100 UNIT/ML FlexPen Start CF: NovoLog (BG-130/45) TIDQAC  Max daily 30 units (Patient taking differently: Inject 0-7 Units into the skin 3 (three) times daily before meals. Start CF: NovoLog (BG-130/45) TIDQAC  Max daily 30 units) 30 mL 3   insulin glargine (LANTUS SOLOSTAR) 100 UNIT/ML Solostar Pen Inject 14 Units into the skin daily. 15 mL 11   linaclotide (LINZESS) 145 MCG CAPS capsule Take 1 capsule (145 mcg total) by mouth daily before breakfast. 90 capsule 3   midodrine (PROAMATINE) 10 MG tablet Take 1 tablet (10 mg total) by mouth 3 (three) times daily. 270 tablet 3   nitroGLYCERIN (NITROSTAT) 0.4 MG SL tablet Place 1 tablet (0.4 mg total) under the tongue every 5 (five) minutes as needed for chest pain. 25 tablet 3   polyethylene glycol (MIRALAX / GLYCOLAX) 17 g packet Take 17 g by mouth daily. 14 each 0   prochlorperazine (COMPAZINE) 10 MG tablet Take 1 tablet (10 mg total) by mouth every 6 (six) hours as needed for nausea or vomiting. 20 tablet 0   promethazine (PHENERGAN) 25 MG tablet Take 25 mg by mouth daily as needed for nausea or vomiting.     sertraline (ZOLOFT) 100 MG tablet Take 2 tablets  (200 mg total) by mouth at bedtime.     traZODone (DESYREL) 50 MG tablet Take 50 mg by mouth at bedtime.     Accu-Chek FastClix Lancets MISC Check blood sugar 3 times daily 100 each 11   Blood Glucose Monitoring Suppl (ACCU-CHEK GUIDE) w/Device KIT Use to check blood sugar 3 times daily 1 kit 0   Continuous Glucose Sensor (FREESTYLE LIBRE 2 SENSOR) MISC Change sensors every 14 days 6 each 3   Continuous Glucose Sensor (FREESTYLE LIBRE 3 PLUS SENSOR) MISC 1 Device by Other route every 14 (fourteen) days. Change sensor every 15 days. 6 each 3   glucose blood (ACCU-CHEK GUIDE) test strip Check blood sugar 3 times daily 100 each 12   glucose blood (TRUE METRIX BLOOD GLUCOSE TEST) test strip Check blood sugar 3 times daily 100 each 12   Insulin Pen Needle 32G X 4 MM MISC 1 Device by Does not apply route in the morning, at noon, in the evening, and at bedtime. 400 each 3   metFORMIN (GLUCOPHAGE-XR) 500 MG 24 hr tablet Take 1 tablet (500 mg total) by mouth 2 (two) times daily with a meal. 180 tablet 3   nicotine (NICODERM CQ - DOSED IN MG/24 HOURS) 14 mg/24hr patch Place 1 patch (14 mg total) onto the skin daily. 30 patch 0    No results found for this or any previous visit (from the past 48 hours). No results found.  Review of Systems  All other systems reviewed and are negative.   There were no vitals taken for this visit. Physical Exam  Patient is alert, oriented, no adenopathy, well-dressed, normal affect, normal respiratory effort. Examination patient has an excellent dorsalis pedis and posterior tibial pulse with excellent revascularization.  The ischemic ulcer of the dorsum of the foot has healthy granulation tissue at this time and is 1 cm in diameter.  The dry gangrene of the left little toe is stable there is no ascending cellulitis no drainage. Assessment/Plan 1. Gangrene of toe of left foot (HCC)       Plan: Will plan for a left foot fifth ray amputation.  Risk and benefits were  discussed including risk of the wound not healing need for additional surgery.  Patient states he understands wished to proceed at this time plan for outpatient surgery tomorrow.  Anticipate we could proceed with MAC anesthetic and a local block.  Nadara Mustard, MD 06/24/2023, 6:46 AM

## 2023-06-24 NOTE — Interval H&P Note (Signed)
History and Physical Interval Note:  06/24/2023 7:53 AM  [name] [name]  has presented today for surgery, with the diagnosis of Gangrene Left 5th Toe.  The various methods of treatment have been discussed with the patient and family. After consideration of risks, benefits and other options for treatment, the patient has consented to  Procedure(s): LEFT 5TH TOE AMPUTATION (Left) as a surgical intervention.  The patient's history has been reviewed, patient examined, no change in status, stable for surgery.  I have reviewed the patient's chart and labs.  Questions were answered to the patient's satisfaction.     Nadara Mustard

## 2023-06-25 ENCOUNTER — Encounter (HOSPITAL_COMMUNITY): Payer: Self-pay | Admitting: Orthopedic Surgery

## 2023-06-27 NOTE — Anesthesia Postprocedure Evaluation (Signed)
Anesthesia Post Note  Patient: Haldon L Linch  Procedure(s) Performed: LEFT 5TH TOE AMPUTATION (Left)     Patient location during evaluation: PACU Anesthesia Type: MAC Level of consciousness: awake and alert Pain management: pain level controlled Vital Signs Assessment: post-procedure vital signs reviewed and stable Respiratory status: spontaneous breathing, nonlabored ventilation, respiratory function stable and patient connected to nasal cannula oxygen Cardiovascular status: stable and blood pressure returned to baseline Postop Assessment: no apparent nausea or vomiting Anesthetic complications: no   No notable events documented.  Last Vitals:  Vitals:   06/24/23 0959 06/24/23 1015  BP: 97/64 111/69  Pulse: 81 81  Resp: 20 20  Temp: 36.9 C 36.9 C  SpO2: 98% 92%    Last Pain:  Vitals:   06/24/23 1015  TempSrc:   PainSc: 0-No pain                 Kennieth Rad

## 2023-06-29 ENCOUNTER — Ambulatory Visit (INDEPENDENT_AMBULATORY_CARE_PROVIDER_SITE_OTHER): Payer: MEDICAID | Admitting: Family

## 2023-06-29 ENCOUNTER — Encounter: Payer: Self-pay | Admitting: Family

## 2023-06-29 DIAGNOSIS — I96 Gangrene, not elsewhere classified: Secondary | ICD-10-CM

## 2023-06-29 MED ORDER — HYDROCODONE-ACETAMINOPHEN 5-325 MG PO TABS: 1.0000 | ORAL_TABLET | Freq: Four times a day (QID) | ORAL | 0 refills | Status: DC | PRN

## 2023-06-29 NOTE — Progress Notes (Signed)
Post-Op Visit Note   Patient: [name] [name]           Date of Birth: 03-27-1971           MRN: [id number] Visit Date: 06/29/2023 PCP: Patient, No Pcp Per  Chief Complaint:  Chief Complaint  Patient presents with   Left Foot - Routine Post Op    06/24/23 left 5th toe amputation    HPI:  HPI The patient is a 53 year old gentleman seen status post left fifth toe amputation January 24.  He has been touchdown weightbearing Ortho Exam On examination left foot the fifth toe amputation site is approximated sutures there is moderate dried blood no active drainage no erythema Visit Diagnoses: No diagnosis found.  Plan: Begin daily dose of cleansing.  Dry dressings.  Follow-up in 2 weeks for suture removal.  Follow-Up Instructions: Return in about 2 weeks (around 07/13/2023).   Imaging: No results found.  Orders:  No orders of the defined types were placed in this encounter.  No orders of the defined types were placed in this encounter.    PMFS History: Patient Active Problem List   Diagnosis Date Noted   Gangrene of toe of left foot (HCC) 06/24/2023   Type 2 diabetes mellitus with diabetic polyneuropathy, with long-term current use of insulin (HCC) 04/11/2023   Type 2 diabetes mellitus with hyperglycemia, with long-term current use of insulin (HCC) 04/11/2023   Diabetes mellitus (HCC) 04/11/2023   PTSD (post-traumatic stress disorder) 03/14/2023   Aortic valve sclerosis 09/16/2022   Hypoglycemia 08/27/2022   COPD GOLD 1/ AB 06/11/2022   Syncope and collapse 12/13/2021   Bifascicular block 12/13/2021   SIRS (systemic inflammatory response syndrome) (HCC) 12/13/2021   Renal insufficiency 12/13/2021   Esophageal motility disorder 12/13/2021   Heartburn    Scrotal abscess 11/19/2020   Severe sepsis (HCC) 09/03/2020   Non-adherence to medical treatment 05/27/2020   Barrett's esophagus 01/23/2019   Diarrhea 01/23/2019   MDD (major depressive disorder), severe (HCC)  12/17/2018   DKA, type 2 (HCC) 11/07/2018   High anion gap metabolic acidosis 03/22/2018   Epigastric abdominal pain 03/22/2018   Nausea vomiting and diarrhea 03/22/2018   AKI (acute kidney injury) (HCC) 03/22/2018   Uncontrolled type 2 diabetes mellitus with hyperglycemia (HCC) 03/22/2018   ARF (acute renal failure) (HCC) 05/30/2017   Mixed hyperlipidemia 03/31/2017   Diabetic polyneuropathy (HCC) 03/31/2017   Vitamin B12 deficiency 03/31/2017   Vitamin D deficiency 03/31/2017   Right bundle branch block 02/14/2017   DKA (diabetic ketoacidosis) (HCC) 10/15/2016   Gastroesophageal reflux disease 10/15/2016   Benign hypertension 10/15/2016   Leukocytosis 11/25/2015   Intractable nausea and vomiting 11/25/2015   Uncontrolled type 2 diabetes mellitus with hypoglycemia, with long-term current use of insulin (HCC) 11/25/2015   Dental abscess 11/25/2015   Nausea and vomiting 11/25/2015   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 03/2015 Cath: LM nl, LAD mild diff dzs throughout w/ 50p/d, LCX diff dzs, 84m, RCA diff dzs, 17m-->Med rx; b. 12/2020 MV: EF 57%, small/mild apical inferior defect w/ partial rev/mild ischemia.   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, bilateral    Sleep apnea 2016   Type 2 diabetes mellitus (HCC)     Family History  Problem Relation Age of Onset    Diabetes Mother    Hypertension Mother    Cancer Mother    Throat cancer Mother    Diabetes Father    Hypertension Father    Hyperlipidemia Father    Congestive Heart Failure Sister    Colon cancer Neg Hx    Pancreatic cancer Neg Hx    Liver disease Neg Hx     Past Surgical History:  Procedure Laterality Date   ABDOMINAL AORTOGRAM W/LOWER EXTREMITY N/A 06/20/2023   Procedure: ABDOMINAL AORTOGRAM W/LOWER EXTREMITY;  Surgeon: Daria Pastures, MD;  Location: [facility];  Service: Cardiovascular;  Laterality: N/A;   AMPUTATION Left 06/24/2023   Procedure: LEFT 5TH TOE AMPUTATION;  Surgeon: Nadara Mustard, MD;  Location: [facility] OR;  Service: Orthopedics;  Laterality: Left;   ANKLE SURGERY Right 1982   "had extra bones in there; took them out"   APPENDECTOMY  1975   BIOPSY  12/26/2018   Procedure: BIOPSY;  Surgeon: Malissa Hippo, MD;  [facility];  Service: Endoscopy;;  duodenal biopsies   BIOPSY  03/23/2019   Procedure: BIOPSY;  Surgeon: Malissa Hippo, MD;  [facility];  Service: Endoscopy;;  esophagus   CARDIAC CATHETERIZATION N/A 03/28/2015   Procedure: Left Heart Cath and Coronary Angiography;  Surgeon: Yates Decamp, MD;  Location: [facility] INVASIVE CV LAB;  Service: Cardiovascular;  Laterality: N/A;   CARDIAC CATHETERIZATION N/A 03/28/2015   Procedure: Intravascular Pressure Wire/FFR Study;  Surgeon: Yates Decamp, MD;  Location: [facility] INVASIVE CV LAB;  Service: Cardiovascular;  Laterality: N/A;   CARPAL TUNNEL RELEASE Left ~ 2008   COLONOSCOPY WITH ESOPHAGOGASTRODUODENOSCOPY (EGD)     COLONOSCOPY WITH PROPOFOL N/A 12/24/2019   Procedure: COLONOSCOPY WITH PROPOFOL;  Surgeon: Malissa Hippo, MD;  [facility];  Service: Endoscopy;  Laterality: N/A;  955   ELBOW FRACTURE SURGERY Left ~ 2008   ESOPHAGEAL MANOMETRY N/A 09/16/2021   Procedure: ESOPHAGEAL MANOMETRY (EM);  Surgeon: Shellia Cleverly, DO;  Location: [facility];  Service: Endoscopy;  Laterality:  N/A;   ESOPHAGOGASTRODUODENOSCOPY N/A 12/26/2018   Procedure: ESOPHAGOGASTRODUODENOSCOPY (EGD);  Surgeon: Malissa Hippo, MD;  [facility];  Service: Endoscopy;  Laterality: N/A;   ESOPHAGOGASTRODUODENOSCOPY (EGD) WITH PROPOFOL N/A 03/23/2019   Procedure: ESOPHAGOGASTRODUODENOSCOPY (EGD) WITH PROPOFOL;  Surgeon: Malissa Hippo, MD;  [facility];  Service: Endoscopy;  Laterality: N/A;  7:30   FRACTURE SURGERY     ankle and elbow   PERIPHERAL VASCULAR BALLOON ANGIOPLASTY  06/20/2023   Procedure: PERIPHERAL VASCULAR BALLOON ANGIOPLASTY;  Surgeon: Daria Pastures, MD;  Location: [facility];  Service: Cardiovascular;;   PH IMPEDANCE STUDY N/A 09/16/2021   Procedure: PH IMPEDANCE STUDY;  Surgeon: Shellia Cleverly, DO;  Location: [facility];  Service: Endoscopy;  Laterality: N/A;   PILONIDAL CYST EXCISION N/A 03/24/2017   Procedure: EXCISION CHRONIC  PILONIDAL ABSCESS;  Surgeon: [name] Miyamoto, MD;  Location: [facility];  Service: General;  Laterality: N/A;   SCROTAL EXPLORATION N/A 11/13/2020   Procedure: EXCISION OF SEBACEOUS CYSTS, SCROTUM;  Surgeon: Malen Gauze, MD;  Location: [facility];  Service: Urology;  Laterality: N/A;   TENDON REPAIR Left ~ 2004   "main tendon in my ankle"   Social History   Occupational History   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  Tobacco Use   Smoking status: Every Day    Current packs/day: 1.00    Average packs/day: 1 pack/day for 34.0 years (34.0 ttl pk-yrs)    Types: Cigarettes    Passive exposure: Never   Smokeless tobacco: Never   Tobacco comments:    1/2 pack a day  Vaping Use   Vaping status: Never Used  Substance and Sexual Activity   Alcohol use: Never    Alcohol/week: 0.0 standard drinks of alcohol   Drug use: Never   Sexual activity: Not Currently

## 2023-07-11 ENCOUNTER — Ambulatory Visit: Payer: MEDICAID | Admitting: Neurology

## 2023-07-12 ENCOUNTER — Other Ambulatory Visit: Payer: Self-pay | Admitting: Internal Medicine

## 2023-07-12 ENCOUNTER — Encounter: Payer: Self-pay | Admitting: Internal Medicine

## 2023-07-12 ENCOUNTER — Ambulatory Visit (INDEPENDENT_AMBULATORY_CARE_PROVIDER_SITE_OTHER): Payer: MEDICAID | Admitting: Family

## 2023-07-12 ENCOUNTER — Telehealth: Payer: Self-pay | Admitting: Family

## 2023-07-12 ENCOUNTER — Ambulatory Visit: Payer: MEDICAID | Admitting: Internal Medicine

## 2023-07-12 ENCOUNTER — Encounter: Payer: Self-pay | Admitting: Family

## 2023-07-12 VITALS — Wt 151.0 lb

## 2023-07-12 DIAGNOSIS — Z794 Long term (current) use of insulin: Secondary | ICD-10-CM

## 2023-07-12 DIAGNOSIS — E1159 Type 2 diabetes mellitus with other circulatory complications: Secondary | ICD-10-CM

## 2023-07-12 DIAGNOSIS — E1142 Type 2 diabetes mellitus with diabetic polyneuropathy: Secondary | ICD-10-CM

## 2023-07-12 DIAGNOSIS — S98132A Complete traumatic amputation of one left lesser toe, initial encounter: Secondary | ICD-10-CM | POA: Insufficient documentation

## 2023-07-12 DIAGNOSIS — E1165 Type 2 diabetes mellitus with hyperglycemia: Secondary | ICD-10-CM | POA: Diagnosis not present

## 2023-07-12 DIAGNOSIS — I96 Gangrene, not elsewhere classified: Secondary | ICD-10-CM

## 2023-07-12 LAB — POCT GLYCOSYLATED HEMOGLOBIN (HGB A1C): Hemoglobin A1C: 8.7 % — AB (ref 4.0–5.6)

## 2023-07-12 MED ORDER — OMNIPOD 5 LIBRE2 PLUS G6 PODS MISC: 1.0000 | 3 refills | Status: DC

## 2023-07-12 MED ORDER — SULFAMETHOXAZOLE-TRIMETHOPRIM 800-160 MG PO TABS: 1.0000 | ORAL_TABLET | Freq: Two times a day (BID) | ORAL | 0 refills | Status: DC

## 2023-07-12 MED ORDER — HYDROCODONE-ACETAMINOPHEN 5-325 MG PO TABS
1.0000 | ORAL_TABLET | Freq: Three times a day (TID) | ORAL | 0 refills | Status: DC | PRN
Start: 2023-07-12 — End: 2023-07-20

## 2023-07-12 MED ORDER — FREESTYLE LIBRE 2 PLUS SENSOR MISC: 1.0000 | 3 refills | Status: DC

## 2023-07-12 MED ORDER — OMNIPOD 5 LIBRE2 PLUS G6 KIT: 1.0000 | PACK | 0 refills | Status: DC

## 2023-07-12 NOTE — Progress Notes (Signed)
Post-Op Visit Note   Patient: [name] [name]           Date of Birth: [birth year]           MRN: [id number] Visit Date: 07/12/2023 PCP: Patient, No Pcp Per  Chief Complaint:  Chief Complaint  Patient presents with   Left Foot - Routine Post Op    06/24/23 left 5th toe amputation    HPI:  HPI The patient is a 53 year old gentleman seen status post left fifth ray amputation Ortho Exam On examination left foot his incision is approximated sutures there is necrotic tissue in the wound bed scant serosanguineous drainage there is mild erythema and edema of the forefoot.  He  There is a new fissure to the posterior heel there is hyperkeratotic tissue and buildup of dry skin which is peeling.  Visit Diagnoses: No diagnosis found.  Plan: Will place on a course of Bactrim.  Encouraged him to use moisturizer on the heel and plantar aspect of his foot dry dressings to the incision follow-up in 2 weeks  Follow-Up Instructions: No follow-ups on file.   Imaging: No results found.  Orders:  No orders of the defined types were placed in this encounter.  No orders of the defined types were placed in this encounter.    PMFS History: Patient Active Problem List   Diagnosis Date Noted   Gangrene of toe of left foot (HCC) 06/24/2023   Type 2 diabetes mellitus with diabetic polyneuropathy, with long-term current use of insulin (HCC) 04/11/2023   Type 2 diabetes mellitus with hyperglycemia, with long-term current use of insulin (HCC) 04/11/2023   Diabetes mellitus (HCC) 04/11/2023   PTSD (post-traumatic stress disorder) 03/14/2023   Aortic valve sclerosis 09/16/2022   Hypoglycemia 08/27/2022   COPD GOLD 1/ AB 06/11/2022   Syncope and collapse 12/13/2021   Bifascicular block 12/13/2021   SIRS (systemic inflammatory response syndrome) (HCC) 12/13/2021   Renal insufficiency 12/13/2021   Esophageal motility disorder 12/13/2021   Heartburn    Scrotal abscess 11/19/2020   Severe sepsis  (HCC) 09/03/2020   Non-adherence to medical treatment 05/27/2020   Barrett's esophagus 01/23/2019   Diarrhea 01/23/2019   MDD (major depressive disorder), severe (HCC) 12/17/2018   DKA, type 2 (HCC) 11/07/2018   High anion gap metabolic acidosis 03/22/2018   Epigastric abdominal pain 03/22/2018   Nausea vomiting and diarrhea 03/22/2018   AKI (acute kidney injury) (HCC) 03/22/2018   Uncontrolled type 2 diabetes mellitus with hyperglycemia (HCC) 03/22/2018   ARF (acute renal failure) (HCC) 05/30/2017   Mixed hyperlipidemia 03/31/2017   Diabetic polyneuropathy (HCC) 03/31/2017   Vitamin B12 deficiency 03/31/2017   Vitamin D deficiency 03/31/2017   Right bundle branch block 02/14/2017   DKA (diabetic ketoacidosis) (HCC) 10/15/2016   Gastroesophageal reflux disease 10/15/2016   Benign hypertension 10/15/2016   Leukocytosis 11/25/2015   Intractable nausea and vomiting 11/25/2015   Uncontrolled type 2 diabetes mellitus with hypoglycemia, with long-term current use of insulin (HCC) 11/25/2015   Dental abscess 11/25/2015   Nausea and vomiting 11/25/2015   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 03/2015 Cath: LM nl,  LAD mild diff dzs throughout w/ 50p/d, LCX diff dzs, 40m, RCA diff dzs, 51m-->Med rx; b. 12/2020 MV: EF 57%, small/mild apical inferior defect w/ partial rev/mild ischemia.   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, bilateral    Sleep apnea 2016   Type 2 diabetes mellitus (HCC)     Family History  Problem Relation Age of Onset   Diabetes Mother    Hypertension Mother    Cancer Mother    Throat cancer Mother    Diabetes Father    Hypertension Father    Hyperlipidemia Father    Congestive Heart Failure Sister    Colon cancer Neg Hx    Pancreatic cancer Neg Hx    Liver disease Neg Hx     Past Surgical History:  Procedure Laterality Date   ABDOMINAL AORTOGRAM W/LOWER EXTREMITY N/A 06/20/2023   Procedure: ABDOMINAL AORTOGRAM W/LOWER EXTREMITY;  Surgeon: Daria Pastures, MD;  [facility];  Service: Cardiovascular;  Laterality: N/A;   AMPUTATION Left 06/24/2023   Procedure: LEFT 5TH TOE AMPUTATION;  Surgeon: Nadara Mustard, MD;  Location: [facility] OR;  Service: Orthopedics;  Laterality: Left;   ANKLE SURGERY Right 1982   "had extra bones in there; took them out"   APPENDECTOMY  1975   BIOPSY  12/26/2018   Procedure: BIOPSY;  Surgeon: Malissa Hippo, MD;  [facility];  Service: Endoscopy;;  duodenal biopsies   BIOPSY  03/23/2019   Procedure: BIOPSY;  Surgeon: Malissa Hippo, MD;  [facility];  Service: Endoscopy;;  esophagus   CARDIAC CATHETERIZATION N/A 03/28/2015   Procedure: Left Heart Cath and Coronary Angiography;  Surgeon: Yates Decamp, MD;  Location[facility] INVASIVE CV LAB;  Service: Cardiovascular;  Laterality: N/A;   CARDIAC CATHETERIZATION N/A 03/28/2015   Procedure: Intravascular Pressure Wire/FFR Study;  Surgeon: Yates Decamp, MD;  Location: [facility] INVASIVE CV LAB;  Service: Cardiovascular;  Laterality: N/A;   CARPAL TUNNEL RELEASE Left ~ 2008   COLONOSCOPY WITH ESOPHAGOGASTRODUODENOSCOPY (EGD)     COLONOSCOPY WITH PROPOFOL N/A 12/24/2019   Procedure: COLONOSCOPY WITH PROPOFOL;  Surgeon: Malissa Hippo, MD;  [facility];  Service: Endoscopy;  Laterality: N/A;  955   ELBOW FRACTURE SURGERY Left ~ 2008    ESOPHAGEAL MANOMETRY N/A 09/16/2021   Procedure: ESOPHAGEAL MANOMETRY (EM);  Surgeon: Shellia Cleverly, DO;  [facility];  Service: Endoscopy;  Laterality: N/A;   ESOPHAGOGASTRODUODENOSCOPY N/A 12/26/2018   Procedure: ESOPHAGOGASTRODUODENOSCOPY (EGD);  Surgeon: Malissa Hippo, MD;  [facility];  Service: Endoscopy;  Laterality: N/A;   ESOPHAGOGASTRODUODENOSCOPY (EGD) WITH PROPOFOL N/A 03/23/2019   Procedure: ESOPHAGOGASTRODUODENOSCOPY (EGD) WITH PROPOFOL;  Surgeon: Malissa Hippo, MD;  [facility];  Service: Endoscopy;  Laterality: N/A;  7:30   FRACTURE SURGERY     ankle and elbow   PERIPHERAL VASCULAR BALLOON ANGIOPLASTY  06/20/2023   Procedure: PERIPHERAL VASCULAR BALLOON ANGIOPLASTY;  Surgeon: Daria Pastures, MD;  [facility];  Service: Cardiovascular;;   PH IMPEDANCE STUDY N/A 09/16/2021   Procedure: PH IMPEDANCE STUDY;  Surgeon: Shellia Cleverly, DO;  [facility];  Service: Endoscopy;  Laterality: N/A;   PILONIDAL CYST EXCISION N/A 03/24/2017   Procedure: EXCISION CHRONIC  PILONIDAL ABSCESS;  Surgeon: [name] Miyamoto, MD;  Location: [facility];  Service: General;  Laterality: N/A;   SCROTAL EXPLORATION N/A 11/13/2020   Procedure: EXCISION OF SEBACEOUS CYSTS, SCROTUM;  Surgeon: Malen Gauze, MD;  Location: [facility];  Service: Urology;  Laterality: N/A;   TENDON REPAIR Left ~ 2004   "main tendon in my ankle"   Social History   Occupational History   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  Tobacco Use   Smoking status: Every Day    Current packs/day: 1.00    Average packs/day: 1 pack/day for 34.0 years (34.0 ttl pk-yrs)    Types: Cigarettes    Passive exposure: Never   Smokeless tobacco: Never   Tobacco comments:    1/2 pack a day  Vaping Use   Vaping status: Never Used  Substance and Sexual Activity   Alcohol use: Never    Alcohol/week: 0.0 standard drinks of alcohol   Drug use: Never   Sexual activity: Not  Currently

## 2023-07-12 NOTE — Telephone Encounter (Signed)
Patient called. Wanted to come today to see Denny Peon instead of tomorrow. Cb# [phone]

## 2023-07-12 NOTE — Patient Instructions (Addendum)
Continue  Lantus 14 units once daily Take Novolog 4 units with each meal PLUS extra if needed based on the scale below  Novolog correctional insulin: Use the scale below to help guide you   Blood sugar before meal Number of units to inject  Less than 175 0 unit  176 -  220 1 units  221 -  265 2 units  266 -  310 3 units  311 -  355 4 units  356 -  400 5 units  401 -  445 6 units  446 -  490 7 units   HOW TO TREAT LOW BLOOD SUGARS (Blood sugar LESS THAN 70 MG/DL) Please follow the RULE OF 15 for the treatment of hypoglycemia treatment (when your (blood sugars are less than 70 mg/dL)   STEP 1: Take 15 grams of carbohydrates when your blood sugar is low, which includes:  3-4 GLUCOSE TABS  OR 3-4 OZ OF JUICE OR REGULAR SODA OR ONE TUBE OF GLUCOSE GEL    STEP 2: RECHECK blood sugar in 15 MINUTES STEP 3: If your blood sugar is still low at the 15 minute recheck --> then, go back to STEP 1 and treat AGAIN with another 15 grams of carbohydrates.

## 2023-07-12 NOTE — Progress Notes (Signed)
Name: [name] [name]  MRN/ DOB: [id number], 1971-02-21   Age/ Sex: 53 y.o., male    PCP: Patient, No Pcp Per   Reason for Endocrinology Evaluation: Type 2 Diabetes Mellitus     Date of Initial Endocrinology Visit: 04/11/2023    PATIENT IDENTIFIER: [name] is a 53 y.o. male with a past medical history of DM, HTN, CAD, COPD, OSA. The patient presented for initial endocrinology clinic visit on 04/11/2023 for consultative assistance with his diabetes management.    HPI: [name] [name]    Diagnosed with DM 2017                Hemoglobin A1c has ranged from 7.8% in 2017, peaking at 15.5% in 2022.  Patient with history of DKA   On his initial visit to our clinic he had an A1c of 9.0%, patient was on NovoLog Mix, metformin, and I switched him to Lantus/NovoLog  Metformin discontinued during hospitalization 06/2023    SUBJECTIVE:   During the last visit (04/11/2023): A1c 9.0%  Today (07/12/23): [name] [name] is here for follow-up on diabetes management.  He checks his blood sugars both times daily. The patient has not had hypoglycemic episodes since the last clinic visit.   Has chronic constipation  Denies nausea or vomiting   Since his last visit here, he is S/P Left 5toe amputation 06/2023 Patient follows with psych  HOME DIABETES REGIMEN: Metformin 500 mg twice daily- not taking  Lantus 14 units daily CF: NovoLog (BG -130/45) TIDQAC   Statin: yes ACE-I/ARB: no   CONTINUOUS GLUCOSE MONITORING RECORD INTERPRETATION    Dates of Recording: 1/29-2/03/2024  Sensor description:freestyle libre 3  Results statistics:   CGM use % of time 93  Average and SD 209/29.3  Time in range     35   %  % Time Above 180 44  % Time above 250 21  % Time Below target 0   Glycemic patterns summary: BG's trend down overnight and fluctuate during the day  Hyperglycemic episodes postprandial  Hypoglycemic episodes occurred N/A  Overnight periods: Trends  down     DIABETIC COMPLICATIONS: Microvascular complications:  Neuropathy, s/p left fifth toe amputation Denies: CKD  Last eye exam: Completed 2 yrs   Macrovascular complications:  CAD Denies:  PVD, CVA   PAST HISTORY: Past Medical History:  Past Medical History:  Diagnosis Date   Anxiety    Arthritis    Asthma    CAD (coronary artery disease)    a. 03/2015 Cath: LM nl, LAD mild diff dzs throughout w/ 50p/d, LCX diff dzs, 85m, RCA diff dzs, 2m-->Med rx; b. 12/2020 MV: EF 57%, small/mild apical inferior defect w/ partial rev/mild ischemia.   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, bilateral    Sleep apnea 2016   Type 2 diabetes mellitus (HCC)    Past Surgical History:  Past Surgical History:  Procedure Laterality Date   ABDOMINAL AORTOGRAM W/LOWER EXTREMITY N/A 06/20/2023   Procedure: ABDOMINAL AORTOGRAM W/LOWER EXTREMITY;  Surgeon: Daria Pastures, MD;  [facility];  Service: Cardiovascular;  Laterality: N/A;   AMPUTATION Left 06/24/2023  Procedure: LEFT 5TH TOE AMPUTATION;  Surgeon: Nadara Mustard, MD;  Location: [facility] OR;  Service: Orthopedics;  Laterality: Left;   ANKLE SURGERY Right 1982   "had extra bones in there; took them out"   APPENDECTOMY  1975   BIOPSY  12/26/2018   Procedure: BIOPSY;  Surgeon: Malissa Hippo, MD;  [facility];  Service: Endoscopy;;  duodenal biopsies   BIOPSY  03/23/2019   Procedure: BIOPSY;  Surgeon: Malissa Hippo, MD;  [facility];  Service: Endoscopy;;  esophagus   CARDIAC CATHETERIZATION N/A 03/28/2015   Procedure: Left Heart Cath and Coronary Angiography;  Surgeon: Yates Decamp, MD;  Location: [facility] INVASIVE CV LAB;  Service:  Cardiovascular;  Laterality: N/A;   CARDIAC CATHETERIZATION N/A 03/28/2015   Procedure: Intravascular Pressure Wire/FFR Study;  Surgeon: Yates Decamp, MD;  Location: [facility] INVASIVE CV LAB;  Service: Cardiovascular;  Laterality: N/A;   CARPAL TUNNEL RELEASE Left ~ 2008   COLONOSCOPY WITH ESOPHAGOGASTRODUODENOSCOPY (EGD)     COLONOSCOPY WITH PROPOFOL N/A 12/24/2019   Procedure: COLONOSCOPY WITH PROPOFOL;  Surgeon: Malissa Hippo, MD;  [facility];  Service: Endoscopy;  Laterality: N/A;  955   ELBOW FRACTURE SURGERY Left ~ 2008   ESOPHAGEAL MANOMETRY N/A 09/16/2021   Procedure: ESOPHAGEAL MANOMETRY (EM);  Surgeon: Shellia Cleverly, DO;  [facility];  Service: Endoscopy;  Laterality: N/A;   ESOPHAGOGASTRODUODENOSCOPY N/A 12/26/2018   Procedure: ESOPHAGOGASTRODUODENOSCOPY (EGD);  Surgeon: Malissa Hippo, MD;  [facility];  Service: Endoscopy;  Laterality: N/A;   ESOPHAGOGASTRODUODENOSCOPY (EGD) WITH PROPOFOL N/A 03/23/2019   Procedure: ESOPHAGOGASTRODUODENOSCOPY (EGD) WITH PROPOFOL;  Surgeon: Malissa Hippo, MD;  [facility];  Service: Endoscopy;  Laterality: N/A;  7:30   FRACTURE SURGERY     ankle and elbow   PERIPHERAL VASCULAR BALLOON ANGIOPLASTY  06/20/2023   Procedure: PERIPHERAL VASCULAR BALLOON ANGIOPLASTY;  Surgeon: Daria Pastures, MD;  [facility];  Service: Cardiovascular;;   PH IMPEDANCE STUDY N/A 09/16/2021   Procedure: PH IMPEDANCE STUDY;  Surgeon: Shellia Cleverly, DO;  [facility];  Service: Endoscopy;  Laterality: N/A;   PILONIDAL CYST EXCISION N/A 03/24/2017   Procedure: EXCISION CHRONIC  PILONIDAL ABSCESS;  Surgeon: [name] Miyamoto, MD;  Location: [facility];  Service: General;  Laterality: N/A;   SCROTAL EXPLORATION N/A 11/13/2020   Procedure: EXCISION OF SEBACEOUS CYSTS, SCROTUM;  Surgeon: Malen Gauze, MD;  Location: [facility];  Service: Urology;  Laterality: N/A;   TENDON REPAIR Left ~ 2004   "main  tendon in my ankle"    Social History:  reports that he has been smoking cigarettes. He has a 34 pack-year smoking history. He has never been exposed to tobacco smoke. He has never used smokeless tobacco. He reports that he does not drink alcohol and does not use drugs. Family History:  Family History  Problem Relation Age of Onset   Diabetes Mother    Hypertension Mother    Cancer Mother    Throat cancer Mother    Diabetes Father    Hypertension Father    Hyperlipidemia Father    Congestive Heart Failure Sister    Colon cancer Neg Hx    Pancreatic cancer Neg Hx    Liver disease Neg Hx      HOME MEDICATIONS: Allergies as of 07/12/2023       Reactions   Prilosec Otc [omeprazole Magnesium] Swelling   Face swells, no breathing impairment  Bee Venom Swelling   Morphine    Itching at IV site,  N+V   Nexium [esomeprazole] Swelling   Face swells, no breathing impairment        Medication List        Accurate as of July 12, 2023 11:26 AM. If you have any questions, ask your nurse or doctor.          STOP taking these medications    doxycycline 100 MG capsule Commonly known as: VIBRAMYCIN Stopped by: Adonis Huguenin       TAKE these medications    Accu-Chek FastClix Lancets Misc Check blood sugar 3 times daily   Accu-Chek Guide w/Device Kit Use to check blood sugar 3 times daily   acetaminophen 500 MG tablet Commonly known as: TYLENOL Take 1,000 mg by mouth 2 (two) times daily as needed for moderate pain, fever or headache.   albuterol 108 (90 Base) MCG/ACT inhaler Commonly known as: VENTOLIN HFA Inhale 2 puffs into the lungs every 6 (six) hours as needed for wheezing or shortness of breath.   ALPRAZolam 1 MG tablet Commonly known as: XANAX Take 1 mg by mouth at bedtime as needed for anxiety or sleep.   amLODipine 5 MG tablet Commonly known as: NORVASC Take 5 mg by mouth every evening.   ARIPiprazole 10 MG tablet Commonly known as: ABILIFY Take  10 mg by mouth daily.   aspirin EC 81 MG tablet Take 81 mg by mouth daily with breakfast.   atorvastatin 80 MG tablet Commonly known as: LIPITOR TAKE 1 Tablet BY MOUTH ONCE EVERY DAY What changed: See the new instructions.   budesonide-formoterol 80-4.5 MCG/ACT inhaler Commonly known as: Symbicort Take 2 puffs first thing in am and then another 2 puffs about 12 hours later. What changed:  how much to take how to take this when to take this reasons to take this additional instructions   clopidogrel 75 MG tablet Commonly known as: Plavix Take 1 tablet (75 mg total) by mouth daily.   ezetimibe 10 MG tablet Commonly known as: ZETIA Take 1 tablet (10 mg total) by mouth daily. What changed: when to take this   famotidine 40 MG tablet Commonly known as: PEPCID Take 1 tablet (40 mg total) by mouth 2 (two) times daily. What changed:  when to take this reasons to take this   FreeStyle Libre 3 Plus Sensor Misc 1 Device by Other route every 14 (fourteen) days. Change sensor every 15 days.   FreeStyle Libre 2 Sensor Misc Change sensors every 14 days   gabapentin 300 MG capsule Commonly known as: Neurontin Take 1 capsule (300 mg total) by mouth every 8 (eight) hours as needed.   HYDROcodone-acetaminophen 5-325 MG tablet Commonly known as: NORCO/VICODIN Take 1 tablet by mouth every 8 (eight) hours as needed. What changed: when to take this Changed by: Adonis Huguenin   Insulin Pen Needle 32G X 4 MM Misc 1 Device by Does not apply route in the morning, at noon, in the evening, and at bedtime.   Lantus SoloStar 100 UNIT/ML Solostar Pen Generic drug: insulin glargine Inject 14 Units into the skin daily.   linaclotide 145 MCG Caps capsule Commonly known as: Linzess Take 1 capsule (145 mcg total) by mouth daily before breakfast.   metFORMIN 500 MG 24 hr tablet Commonly known as: GLUCOPHAGE-XR Take 1 tablet (500 mg total) by mouth 2 (two) times daily with a meal.    midodrine 10 MG tablet Commonly known as:  PROAMATINE Take 1 tablet (10 mg total) by mouth 3 (three) times daily.   mupirocin ointment 2 % Commonly known as: BACTROBAN Apply topically 2 (two) times daily.   nicotine 14 mg/24hr patch Commonly known as: NICODERM CQ - dosed in mg/24 hours Place 1 patch (14 mg total) onto the skin daily.   nitroGLYCERIN 0.4 MG SL tablet Commonly known as: NITROSTAT Place 1 tablet (0.4 mg total) under the tongue every 5 (five) minutes as needed for chest pain.   NovoLOG FlexPen 100 UNIT/ML FlexPen Generic drug: insulin aspart Start CF: NovoLog (BG-130/45) TIDQAC  Max daily 30 units What changed:  how much to take how to take this when to take this   polyethylene glycol 17 g packet Commonly known as: MIRALAX / GLYCOLAX Take 17 g by mouth daily.   prochlorperazine 10 MG tablet Commonly known as: COMPAZINE Take 1 tablet (10 mg total) by mouth every 6 (six) hours as needed for nausea or vomiting.   promethazine 25 MG tablet Commonly known as: PHENERGAN Take 25 mg by mouth daily as needed for nausea or vomiting.   sertraline 100 MG tablet Commonly known as: ZOLOFT Take 2 tablets (200 mg total) by mouth at bedtime.   sulfamethoxazole-trimethoprim 800-160 MG tablet Commonly known as: BACTRIM DS Take 1 tablet by mouth 2 (two) times daily. Started by: Adonis Huguenin   traZODone 50 MG tablet Commonly known as: DESYREL Take 50 mg by mouth at bedtime.   True Metrix Blood Glucose Test test strip Generic drug: glucose blood Check blood sugar 3 times daily   Accu-Chek Guide test strip Generic drug: glucose blood Check blood sugar 3 times daily         ALLERGIES: Allergies  Allergen Reactions   Prilosec Otc [Omeprazole Magnesium] Swelling    Face swells, no breathing impairment   Bee Venom Swelling   Morphine     Itching at IV site,  N+V   Nexium [Esomeprazole] Swelling    Face swells, no breathing impairment     REVIEW OF  SYSTEMS: A comprehensive ROS was conducted with the patient and is negative except as per HPI     OBJECTIVE:   VITAL SIGNS: Wt 151 lb (68.5 kg) Comment: patient reported  BMI 22.96 kg/m    PHYSICAL EXAM:  General: Pt appears well and is in NAD  Lungs: Clear with good BS bilat   Heart: RRR   Abdomen:  soft, but tender   Extremities:  Lower extremities - No pretibial edema.   Neuro: MS is good with appropriate affect, pt is alert and Ox3    DM foot exam: 04/11/2023  The skin of the feet is intact without sores or ulcerations. The pedal pulses are undetectable The sensation is absent to a screening 5.07, 10 gram monofilament bilaterally   DATA REVIEWED:  Lab Results  Component Value Date   HGBA1C 8.7 (A) 07/12/2023   HGBA1C 9.0 (A) 04/11/2023   HGBA1C 13.2 (H) 08/29/2022    Latest Reference Range & Units 02/08/23 13:06  Sodium 135 - 145 mmol/L 137  Potassium 3.5 - 5.1 mmol/L 4.8  Chloride 98 - 111 mmol/L 104  CO2 22 - 32 mmol/L 24  Glucose 70 - 99 mg/dL 098 (H)  BUN 6 - 20 mg/dL 29 (H)  Creatinine 1.19 - 1.24 mg/dL 1.47 (H)  Calcium 8.9 - 10.3 mg/dL 8.3 (L)  Anion gap 5 - 15  9  Magnesium 1.7 - 2.4 mg/dL 1.8  GFR, Estimated >82 mL/min  55 (L)      ASSESSMENT / PLAN / RECOMMENDATIONS:   1) Type 2 Diabetes Mellitus, Poorly controlled, With neuropathic and macrovascular complications - Most recent A1c of 8.7 %. Goal A1c < 7.0 %.    -A1c trending down but continues to be above goal - Patient with history of DKA, not a candidate for SGLT2 inhibitors -Patient would like to avoid further weight loss, so will avoid GLP-1 agonist at this time -Metformin was discontinued during hospitalization 06/2023 -Patient has been no postprandial hyperglycemia, I will start him on a standing dose of prandial insulin as below, he was encouraged to use correction scale as needed in addition to the 4 units of NovoLog with each meal -Patient interested in insulin pump technology, a  prescription for OmniPod 5, freestyle libre 2+ and FL to our CDE has been placed  MEDICATIONS: Continue Lantus 14 units once daily Start NovoLog 4 units 3 times daily before every meal Continue CF: NovoLog (BG-130/45) TIDQAC   EDUCATION / INSTRUCTIONS: BG monitoring instructions: Patient is instructed to check his blood sugars 3 times a day. Call [facility] if: BG persistently < 70  I reviewed the Rule of 15 for the treatment of hypoglycemia in detail with the patient. Literature supplied.   2) Diabetic complications:  Eye: Does not have known diabetic retinopathy.  Neuro/ Feet: Does  have known diabetic peripheral neuropathy. Renal: Patient does not have known baseline CKD. He is not on an ACEI/ARB at present.    Follow-up in 4months  Signed electronically by: Lyndle Herrlich, MD  [facility] End[facility]  [facility] [address] [place], [place] [address] Phone: [phone] FAX: [phone]   CC: Patient, No Pcp Per No address on file Phone: None  Fax: None    Return to Endocrinology clinic as below: Future Appointments  Date Time Provider Department Center  07/12/2023  1:40 PM Ashawnti Tangen, Konrad Dolores, MD [facility] None  07/26/2023 10:15 AM Adonis Huguenin, NP [facility] None  07/29/2023  9:30 AM [facility] 4 [facility]  07/29/2023 10:00 AM [facility] 4 [facility]  07/29/2023 10:45 AM [facility] PA-2 [facility]  08/22/2023  8:15 AM [name], MD [facility] None  11/14/2023  2:20 PM Jonelle Sidle, MD [facility]  01/17/2024  1:30 PM [facility] None  01/17/2024  2:30 PM Carnella Gu[place]aj[place], PA-C [facility] None

## 2023-07-12 NOTE — Telephone Encounter (Signed)
SW pt , he is seeing Denny Peon today at 96.

## 2023-07-13 ENCOUNTER — Encounter: Payer: MEDICAID | Admitting: Family

## 2023-07-14 ENCOUNTER — Ambulatory Visit: Payer: MEDICAID | Admitting: Orthopaedic Surgery

## 2023-07-19 ENCOUNTER — Ambulatory Visit (INDEPENDENT_AMBULATORY_CARE_PROVIDER_SITE_OTHER): Payer: MEDICAID | Admitting: Orthopedic Surgery

## 2023-07-19 ENCOUNTER — Encounter: Payer: Self-pay | Admitting: Orthopedic Surgery

## 2023-07-19 ENCOUNTER — Encounter (HOSPITAL_COMMUNITY): Payer: Self-pay | Admitting: Orthopedic Surgery

## 2023-07-19 ENCOUNTER — Other Ambulatory Visit: Payer: Self-pay

## 2023-07-19 DIAGNOSIS — I96 Gangrene, not elsewhere classified: Secondary | ICD-10-CM

## 2023-07-19 NOTE — Progress Notes (Addendum)
PCP - [facility]  Cardiologist -  Dr Nona Dell Endocrinology - Dr Brent Bulla [facility]  Chest x-ray - n/a EKG - 02/09/23 Stress Test - 01/13/21 ECHO - 08/28/22 Cardiac Cath - 2016  ICD Pacemaker/Loop - n/a  Sleep Study -  Yes CPAP - does not wear CPAP  Diabetes Type 2  FreeStyle Libre 3 Plus Sensor on left upper arm.    Do not take Metformin on the morning of surgery.  THE MORNING OF SURGERY, take 7 units of Insulin Glargine.  No Novolog Insulin on DOS unless CBG > 220.  If CBG > 220, then 1/2 Novolog Insulin dose.  If your blood sugar is less than 70 mg/dL, you will need to treat for low blood sugar: Treat a low blood sugar (less than 70 mg/dL) with  cup of clear juice (cranberry or apple), 4 glucose tablets, OR glucose gel. Recheck blood sugar in 15 minutes after treatment (to make sure it is greater than 70 mg/dL). If your blood sugar is not greater than 70 mg/dL on recheck, call [phone] for further instructions.  Blood Thinner Instructions:  Follow your surgeon's instructions on when to stop prior Plavix to surgery.  Last dose on 07/19/23  Aspirin Instructions: Follow your surgeon's instructions on when to stop aspirin prior to surgery,  Last dose ASA was in January 2025.   ERAS - Clear liquids til 6:40 AM DOS.  Anesthesia review: Yes.  Fayrene Fearing, [place]  STOP now taking any Aspirin (unless otherwise instructed by your surgeon), Aleve, Naproxen, Ibuprofen, Motrin, Advil, Goody's, BC's, all herbal medications, fish oil, and all vitamins.   Coronavirus Screening Do you have any of the following symptoms:  Cough yes/no: No Fever (>100.66F)  yes/no: No Runny nose yes/no: No Sore throat yes/no: No Difficulty breathing/shortness of breath  yes/no: No  Have you traveled in the last 14 days and where? yes/no: No  Patient verbalized understanding of instructions that were given via phone.

## 2023-07-19 NOTE — Progress Notes (Signed)
Office Visit Note   Patient: [name] [name]           Date of Birth: May 17, 1971           MRN: [id number] Visit Date: 07/19/2023              Requested by: No referring provider defined for this encounter. PCP: Patient, No Pcp Per  Chief Complaint  Patient presents with   Left Foot - Routine Post Op    /24/25 left 5th toe amputation      HPI: Patient is a 53 year old gentleman who is status post revascularization to the left lower extremity January 20.  Patient underwent fifth toe amputation on January 24.  Patient states he recently started smelling an odor.  Assessment & Plan: Visit Diagnoses:  1. Gangrene of toe of left foot (HCC)     Plan: Will plan for fourth and fifth ray amputation.  Risks and benefits were discussed including risk of the wound not healing need for additional surgery.  Patient states he understands wished to proceed at this time.  Recommended minimizing weightbearing and using crutches.  Follow-Up Instructions: Return in about 1 week (around 07/26/2023).   Ortho Exam  Patient is alert, oriented, no adenopathy, well-dressed, normal affect, normal respiratory effort.  Patient is full weightbearing in a postoperative shoe he states he has crutches at home.  Examination patient has a palpable dorsalis pedis pulse and still has good circulation status post revascularization.  Examination of the wound from the fifth toe amputation shows necrotic tissue with exposed fourth metatarsal head.  Despite patient's macro circulation improvement he has microcirculatory disease.  Imaging: No results found. No images are attached to the encounter.  Labs: Lab Results  Component Value Date   HGBA1C 8.7 (A) 07/12/2023   HGBA1C 9.0 (A) 04/11/2023   HGBA1C 13.2 (H) 08/29/2022   ESRSEDRATE 20 (H) 06/18/2022   ESRSEDRATE 1 08/20/2015   CRP <0.5 06/18/2022   CRP 2.5 01/23/2019   CRP <0.5 08/20/2015   REPTSTATUS 06/18/2023 FINAL 06/13/2023   CULT  06/13/2023     NO GROWTH 5 DAYS Performed at [facility], [address], [place], [place] [address]      Lab Results  Component Value Date   ALBUMIN 3.2 (L) 08/27/2022   ALBUMIN 2.8 (L) 07/26/2022   ALBUMIN 4.1 07/06/2022    Lab Results  Component Value Date   MG 1.8 02/08/2023   MG 2.0 08/29/2022   MG 1.5 (L) 08/28/2022   No results found for: "VD25OH"  No results found for: "PREALBUMIN"    Latest Ref Rng & Units 06/20/2023    5:32 AM 06/13/2023    6:05 PM 02/08/2023    1:06 PM  CBC EXTENDED  WBC 4.0 - 10.5 K/uL  13.2  12.1   RBC 4.22 - 5.81 MIL/uL  4.59  4.71   Hemoglobin 13.0 - 17.0 g/dL 47.4  25.9  56.3   HCT 39.0 - 52.0 % 40.0  43.5  44.1   Platelets 150 - 400 K/uL  251  262   NEUT# 1.7 - 7.7 K/uL  9.7    Lymph# 0.7 - 4.0 K/uL  2.4       There is no height or weight on file to calculate BMI.  Orders:  No orders of the defined types were placed in this encounter.  No orders of the defined types were placed in this encounter.    Procedures: No procedures performed  Clinical Data:  No additional findings.  ROS:  All other systems negative, except as noted in the HPI. Review of Systems  Objective: Vital Signs: There were no vitals taken for this visit.  Specialty Comments:  No specialty comments available.  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) 07/12/2023   Gangrene of toe of left foot (HCC) 06/24/2023   Type 2 diabetes mellitus with diabetic polyneuropathy, with long-term current use of insulin (HCC) 04/11/2023   Type 2 diabetes mellitus with hyperglycemia, with long-term current use of insulin (HCC) 04/11/2023   Diabetes mellitus (HCC) 04/11/2023   PTSD (post-traumatic stress disorder) 03/14/2023   Aortic valve sclerosis 09/16/2022   Hypoglycemia 08/27/2022   COPD GOLD 1/ AB 06/11/2022   Syncope and collapse 12/13/2021   Bifascicular block 12/13/2021   SIRS (systemic  inflammatory response syndrome) (HCC) 12/13/2021   Renal insufficiency 12/13/2021   Esophageal motility disorder 12/13/2021   Heartburn    Scrotal abscess 11/19/2020   Severe sepsis (HCC) 09/03/2020   Non-adherence to medical treatment 05/27/2020   Barrett's esophagus 01/23/2019   Diarrhea 01/23/2019   MDD (major depressive disorder), severe (HCC) 12/17/2018   DKA, type 2 (HCC) 11/07/2018   High anion gap metabolic acidosis 03/22/2018   Epigastric abdominal pain 03/22/2018   Nausea vomiting and diarrhea 03/22/2018   AKI (acute kidney injury) (HCC) 03/22/2018   Uncontrolled type 2 diabetes mellitus with hyperglycemia (HCC) 03/22/2018   ARF (acute renal failure) (HCC) 05/30/2017   Mixed hyperlipidemia 03/31/2017   Diabetic polyneuropathy (HCC) 03/31/2017   Vitamin B12 deficiency 03/31/2017   Vitamin D deficiency 03/31/2017   Right bundle branch block 02/14/2017   DKA (diabetic ketoacidosis) (HCC) 10/15/2016   Gastroesophageal reflux disease 10/15/2016   Benign hypertension 10/15/2016   Leukocytosis 11/25/2015   Intractable nausea and vomiting 11/25/2015   Uncontrolled type 2 diabetes mellitus with hypoglycemia, with long-term current use of insulin (HCC) 11/25/2015   Dental abscess 11/25/2015   Nausea and vomiting 11/25/2015   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 03/2015 Cath: LM nl, LAD mild diff dzs throughout w/ 50p/d, LCX diff dzs, 53m, RCA diff dzs, 106m-->Med rx; b. 12/2020 MV: EF 57%, small/mild apical inferior defect w/ partial rev/mild ischemia.   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, bilateral    Sleep apnea 2016   Type 2 diabetes mellitus (HCC)     Family History  Problem Relation Age of Onset   Diabetes Mother    Hypertension Mother    Cancer Mother    Throat cancer Mother    Diabetes Father    Hypertension Father    Hyperlipidemia Father    Congestive Heart Failure Sister    Colon cancer Neg Hx  Pancreatic cancer Neg Hx    Liver disease Neg Hx     Past Surgical History:  Procedure Laterality Date   ABDOMINAL AORTOGRAM W/LOWER EXTREMITY N/A 06/20/2023   Procedure: ABDOMINAL AORTOGRAM W/LOWER EXTREMITY;  Surgeon: Daria Pastures, MD;  Location: [facility] INVASIVE CV LAB;  Service: Cardiovascular;  Laterality: N/A;   AMPUTATION Left 06/24/2023   Procedure: LEFT 5TH TOE AMPUTATION;  Surgeon: Nadara Mustard, MD;  Location: [facility] OR;  Service: Orthopedics;  Laterality: Left;   ANKLE SURGERY Right 1982   "had extra bones in there; took them out"   APPENDECTOMY  1975   BIOPSY  12/26/2018   Procedure: BIOPSY;  Surgeon: Malissa Hippo, MD;  Location: [facility];  Service: Endoscopy;;  duodenal biopsies   BIOPSY  03/23/2019   Procedure: BIOPSY;  Surgeon: Malissa Hippo, MD;  Location: [facility];  Service: Endoscopy;;  esophagus   CARDIAC CATHETERIZATION N/A 03/28/2015   Procedure: Left Heart Cath and Coronary Angiography;  Surgeon: Yates Decamp, MD;  Location[facility]VASIVE CV LAB;  Service: Cardiovascular;  Laterality: N/A;   CARDIAC CATHETERIZATION N/A 03/28/2015   Procedure: Intravascular Pressure Wire/FFR Study;  Surgeon: Yates Decamp, MD;  Location: [facility] INVASIVE CV LAB;  Service: Cardiovascular;  Laterality: N/A;   CARPAL TUNNEL RELEASE Left ~ 2008   COLONOSCOPY WITH ESOPHAGOGASTRODUODENOSCOPY (EGD)     COLONOSCOPY WITH PROPOFOL N/A  12/24/2019   Procedure: COLONOSCOPY WITH PROPOFOL;  Surgeon: Malissa Hippo, MD;  Location: [facility];  Service: Endoscopy;  Laterality: N/A;  955   ELBOW FRACTURE SURGERY Left ~ 2008   ESOPHAGEAL MANOMETRY N/A 09/16/2021   Procedure: ESOPHAGEAL MANOMETRY (EM);  Surgeon: Shellia Cleverly, DO;  Location: [facility];  Service: Endoscopy;  Laterality: N/A;   ESOPHAGOGASTRODUODENOSCOPY N/A 12/26/2018   Procedure: ESOPHAGOGASTRODUODENOSCOPY (EGD);  Surgeon: Malissa Hippo, MD;  Location: [facility];  Service: Endoscopy;  Laterality: N/A;   ESOPHAGOGASTRODUODENOSCOPY (EGD) WITH PROPOFOL N/A 03/23/2019   Procedure: ESOPHAGOGASTRODUODENOSCOPY (EGD) WITH PROPOFOL;  Surgeon: Malissa Hippo, MD;  Location: [facility];  Service: Endoscopy;  Laterality: N/A;  7:30   FRACTURE SURGERY     ankle and elbow   PERIPHERAL VASCULAR BALLOON ANGIOPLASTY  06/20/2023   Procedure: PERIPHERAL VASCULAR BALLOON ANGIOPLASTY;  Surgeon: Daria Pastures, MD;  Location: [facility] INVASIVE CV LAB;  Service: Cardiovascular;;   PH IMPEDANCE STUDY N/A 09/16/2021   Procedure: PH IMPEDANCE STUDY;  Surgeon: Shellia Cleverly, DO;  Location: [facility];  Service: Endoscopy;  Laterality: N/A;   PILONIDAL CYST EXCISION N/A 03/24/2017   Procedure: EXCISION CHRONIC  PILONIDAL ABSCESS;  Surgeon: [name] Miyamoto, MD;  Location: [facility];  Service: General;  Laterality: N/A;   SCROTAL EXPLORATION N/A 11/13/2020   Procedure: EXCISION OF SEBACEOUS CYSTS, SCROTUM;  Surgeon: Malen Gauze, MD;  Location: [facility];  Service: Urology;  Laterality: N/A;   TENDON REPAIR Left ~ 2004   "main tendon in my ankle"   Social History   Occupational History   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  Tobacco Use   Smoking status: Every Day    Current packs/day: 1.00    Average packs/day: 1 pack/day for 34.0 years (34.0 ttl pk-yrs)    Types: Cigarettes    Passive exposure: Never   Smokeless tobacco: Never   Tobacco comments:    1/2 pack a  day  Vaping Use   Vaping status: Never Used  Substance and Sexual Activity   Alcohol use: Never  Alcohol/week: 0.0 standard drinks of alcohol   Drug use: Never   Sexual activity: Not Currently

## 2023-07-20 ENCOUNTER — Encounter (HOSPITAL_COMMUNITY)
Admission: RE | Disposition: A | Discharge status: Home / Self Care | Payer: Self-pay | Source: Home / Self Care | Attending: Orthopedic Surgery

## 2023-07-20 ENCOUNTER — Ambulatory Visit (HOSPITAL_COMMUNITY): Payer: MEDICAID | Admitting: Physician Assistant

## 2023-07-20 ENCOUNTER — Ambulatory Visit (HOSPITAL_BASED_OUTPATIENT_CLINIC_OR_DEPARTMENT_OTHER): Payer: MEDICAID | Admitting: Physician Assistant

## 2023-07-20 ENCOUNTER — Other Ambulatory Visit: Payer: Self-pay

## 2023-07-20 ENCOUNTER — Encounter (HOSPITAL_COMMUNITY): Payer: Self-pay | Admitting: Orthopedic Surgery

## 2023-07-20 ENCOUNTER — Ambulatory Visit (HOSPITAL_COMMUNITY)
Admission: RE | Admit: 2023-07-20 | Discharge: 2023-07-20 | Disposition: A | Discharge status: Home / Self Care | Payer: MEDICAID | Attending: Orthopedic Surgery | Admitting: Orthopedic Surgery

## 2023-07-20 DIAGNOSIS — Z7984 Long term (current) use of oral hypoglycemic drugs: Secondary | ICD-10-CM | POA: Insufficient documentation

## 2023-07-20 DIAGNOSIS — J449 Chronic obstructive pulmonary disease, unspecified: Secondary | ICD-10-CM | POA: Diagnosis not present

## 2023-07-20 DIAGNOSIS — Z79899 Other long term (current) drug therapy: Secondary | ICD-10-CM | POA: Diagnosis not present

## 2023-07-20 DIAGNOSIS — I96 Gangrene, not elsewhere classified: Secondary | ICD-10-CM

## 2023-07-20 DIAGNOSIS — F17210 Nicotine dependence, cigarettes, uncomplicated: Secondary | ICD-10-CM | POA: Insufficient documentation

## 2023-07-20 DIAGNOSIS — T8781 Dehiscence of amputation stump: Secondary | ICD-10-CM

## 2023-07-20 DIAGNOSIS — G4733 Obstructive sleep apnea (adult) (pediatric): Secondary | ICD-10-CM | POA: Insufficient documentation

## 2023-07-20 DIAGNOSIS — I10 Essential (primary) hypertension: Secondary | ICD-10-CM

## 2023-07-20 DIAGNOSIS — Z7902 Long term (current) use of antithrombotics/antiplatelets: Secondary | ICD-10-CM | POA: Diagnosis not present

## 2023-07-20 DIAGNOSIS — E1142 Type 2 diabetes mellitus with diabetic polyneuropathy: Secondary | ICD-10-CM | POA: Insufficient documentation

## 2023-07-20 DIAGNOSIS — E1122 Type 2 diabetes mellitus with diabetic chronic kidney disease: Secondary | ICD-10-CM | POA: Diagnosis not present

## 2023-07-20 DIAGNOSIS — L97529 Non-pressure chronic ulcer of other part of left foot with unspecified severity: Secondary | ICD-10-CM | POA: Insufficient documentation

## 2023-07-20 DIAGNOSIS — I2510 Atherosclerotic heart disease of native coronary artery without angina pectoris: Secondary | ICD-10-CM

## 2023-07-20 DIAGNOSIS — K219 Gastro-esophageal reflux disease without esophagitis: Secondary | ICD-10-CM | POA: Diagnosis not present

## 2023-07-20 DIAGNOSIS — N189 Chronic kidney disease, unspecified: Secondary | ICD-10-CM | POA: Diagnosis not present

## 2023-07-20 DIAGNOSIS — I129 Hypertensive chronic kidney disease with stage 1 through stage 4 chronic kidney disease, or unspecified chronic kidney disease: Secondary | ICD-10-CM | POA: Insufficient documentation

## 2023-07-20 DIAGNOSIS — E1152 Type 2 diabetes mellitus with diabetic peripheral angiopathy with gangrene: Secondary | ICD-10-CM | POA: Insufficient documentation

## 2023-07-20 DIAGNOSIS — E11621 Type 2 diabetes mellitus with foot ulcer: Secondary | ICD-10-CM | POA: Insufficient documentation

## 2023-07-20 DIAGNOSIS — Y835 Amputation of limb(s) as the cause of abnormal reaction of the patient, or of later complication, without mention of misadventure at the time of the procedure: Secondary | ICD-10-CM | POA: Diagnosis not present

## 2023-07-20 DIAGNOSIS — G4730 Sleep apnea, unspecified: Secondary | ICD-10-CM | POA: Insufficient documentation

## 2023-07-20 HISTORY — DX: Nausea with vomiting, unspecified: Z98.890

## 2023-07-20 HISTORY — DX: Nausea with vomiting, unspecified: R11.2

## 2023-07-20 HISTORY — PX: AMPUTATION: SHX166

## 2023-07-20 LAB — CBC
HCT: 37.1 % — ABNORMAL LOW (ref 39.0–52.0)
Hemoglobin: 12.4 g/dL — ABNORMAL LOW (ref 13.0–17.0)
MCH: 30.4 pg (ref 26.0–34.0)
MCHC: 33.4 g/dL (ref 30.0–36.0)
MCV: 90.9 fL (ref 80.0–100.0)
Platelets: 297 10*3/uL (ref 150–400)
RBC: 4.08 MIL/uL — ABNORMAL LOW (ref 4.22–5.81)
RDW: 12.8 % (ref 11.5–15.5)
WBC: 14.1 10*3/uL — ABNORMAL HIGH (ref 4.0–10.5)
nRBC: 0 % (ref 0.0–0.2)

## 2023-07-20 LAB — BASIC METABOLIC PANEL
Anion gap: 10 (ref 5–15)
BUN: 26 mg/dL — ABNORMAL HIGH (ref 6–20)
CO2: 24 mmol/L (ref 22–32)
Calcium: 8.1 mg/dL — ABNORMAL LOW (ref 8.9–10.3)
Chloride: 99 mmol/L (ref 98–111)
Creatinine, Ser: 2.14 mg/dL — ABNORMAL HIGH (ref 0.61–1.24)
GFR, Estimated: 36 mL/min — ABNORMAL LOW (ref >60)
Glucose, Bld: 163 mg/dL — ABNORMAL HIGH (ref 70–99)
Potassium: 4.3 mmol/L (ref 3.5–5.1)
Sodium: 133 mmol/L — ABNORMAL LOW (ref 135–145)

## 2023-07-20 LAB — GLUCOSE, CAPILLARY
Glucose-Capillary: 161 mg/dL — ABNORMAL HIGH (ref 70–99)
Glucose-Capillary: 159 mg/dL — ABNORMAL HIGH (ref 70–99)

## 2023-07-20 SURGERY — AMPUTATION, FOOT, RAY
Anesthesia: General | Site: Foot | Laterality: Left

## 2023-07-20 MED ORDER — ONDANSETRON HCL 4 MG/2ML IJ SOLN
INTRAMUSCULAR | Status: DC | PRN
  Administered 2023-07-20: 4 mg via INTRAVENOUS

## 2023-07-20 MED ORDER — LIDOCAINE 2% (20 MG/ML) 5 ML SYRINGE
INTRAMUSCULAR | Status: DC | PRN
  Administered 2023-07-20: 40 mg via INTRAVENOUS

## 2023-07-20 MED ORDER — FENTANYL CITRATE (PF) 250 MCG/5ML IJ SOLN
INTRAMUSCULAR | Status: AC
Start: 2023-07-20 — End: ?
  Filled 2023-07-20: qty 5

## 2023-07-20 MED ORDER — PHENYLEPHRINE 80 MCG/ML (10ML) SYRINGE FOR IV PUSH (FOR BLOOD PRESSURE SUPPORT)
PREFILLED_SYRINGE | INTRAVENOUS | Status: AC
  Filled 2023-07-20: qty 10

## 2023-07-20 MED ORDER — PROPOFOL 10 MG/ML IV BOLUS
INTRAVENOUS | Status: AC
  Filled 2023-07-20: qty 20

## 2023-07-20 MED ORDER — ORAL CARE MOUTH RINSE: 15.0000 mL | Freq: Once | OROMUCOSAL | Status: AC

## 2023-07-20 MED ORDER — ONDANSETRON HCL 4 MG/2ML IJ SOLN
INTRAMUSCULAR | Status: AC
  Filled 2023-07-20: qty 2

## 2023-07-20 MED ORDER — MIDAZOLAM HCL 2 MG/2ML IJ SOLN
INTRAMUSCULAR | Status: DC | PRN
  Administered 2023-07-20: 1 mg via INTRAVENOUS

## 2023-07-20 MED ORDER — INSULIN ASPART 100 UNIT/ML IJ SOLN
0.0000 [IU] | INTRAMUSCULAR | Status: DC | PRN
  Filled 2023-07-20: qty 1

## 2023-07-20 MED ORDER — OXYCODONE HCL 5 MG PO TABS
ORAL_TABLET | ORAL | Status: AC
  Administered 2023-07-20: 5 mg via ORAL
  Filled 2023-07-20: qty 1

## 2023-07-20 MED ORDER — PROPOFOL 10 MG/ML IV BOLUS
INTRAVENOUS | Status: DC | PRN
  Administered 2023-07-20: 140 mg via INTRAVENOUS

## 2023-07-20 MED ORDER — VASHE WOUND IRRIGATION OPTIME
TOPICAL | Status: DC | PRN
  Administered 2023-07-20: 34 [oz_av]

## 2023-07-20 MED ORDER — DROPERIDOL 2.5 MG/ML IJ SOLN: 0.6250 mg | Freq: Once | INTRAMUSCULAR | Status: DC | PRN

## 2023-07-20 MED ORDER — CEFAZOLIN SODIUM-DEXTROSE 2-4 GM/100ML-% IV SOLN
2.0000 g | INTRAVENOUS | Status: AC
  Administered 2023-07-20: 2 g via INTRAVENOUS
  Filled 2023-07-20: qty 100

## 2023-07-20 MED ORDER — VANCOMYCIN HCL 1000 MG IV SOLR
INTRAVENOUS | Status: AC
  Filled 2023-07-20: qty 20

## 2023-07-20 MED ORDER — LIDOCAINE 2% (20 MG/ML) 5 ML SYRINGE
INTRAMUSCULAR | Status: AC
  Filled 2023-07-20: qty 5

## 2023-07-20 MED ORDER — FENTANYL CITRATE (PF) 250 MCG/5ML IJ SOLN
INTRAMUSCULAR | Status: DC | PRN
Start: 2023-07-20 — End: 2023-07-20
  Administered 2023-07-20 (×2): 25 ug via INTRAVENOUS

## 2023-07-20 MED ORDER — 0.9 % SODIUM CHLORIDE (POUR BTL) OPTIME
TOPICAL | Status: DC | PRN
  Administered 2023-07-20: 1000 mL

## 2023-07-20 MED ORDER — CHLORHEXIDINE GLUCONATE 0.12 % MT SOLN
15.0000 mL | Freq: Once | OROMUCOSAL | Status: AC
  Administered 2023-07-20: 15 mL via OROMUCOSAL
  Filled 2023-07-20: qty 15

## 2023-07-20 MED ORDER — OXYCODONE HCL 5 MG PO TABS: 5.0000 mg | ORAL_TABLET | Freq: Once | ORAL | Status: AC | PRN

## 2023-07-20 MED ORDER — HYDROCODONE-ACETAMINOPHEN 5-325 MG PO TABS: 1.0000 | ORAL_TABLET | ORAL | 0 refills | Status: DC | PRN

## 2023-07-20 MED ORDER — VANCOMYCIN HCL 1000 MG IV SOLR
INTRAVENOUS | Status: DC | PRN
  Administered 2023-07-20: 1000 mg via TOPICAL

## 2023-07-20 MED ORDER — ONDANSETRON HCL 4 MG/2ML IJ SOLN: 4.0000 mg | Freq: Once | INTRAMUSCULAR | Status: DC | PRN

## 2023-07-20 MED ORDER — LACTATED RINGERS IV SOLN: INTRAVENOUS | Status: DC

## 2023-07-20 MED ORDER — HYDROMORPHONE HCL 1 MG/ML IJ SOLN: 0.2500 mg | INTRAMUSCULAR | Status: DC | PRN

## 2023-07-20 MED ORDER — OXYCODONE HCL 5 MG/5ML PO SOLN: 5.0000 mg | Freq: Once | ORAL | Status: AC | PRN

## 2023-07-20 MED ORDER — MIDAZOLAM HCL 2 MG/2ML IJ SOLN
INTRAMUSCULAR | Status: AC
  Filled 2023-07-20: qty 2

## 2023-07-20 SURGICAL SUPPLY — 26 items
BAG COUNTER SPONGE SURGICOUNT (BAG) ×2 IMPLANT
BLADE SAW SGTL MED 73X18.5 STR (BLADE) IMPLANT
BLADE SURG 21 STRL SS (BLADE) ×2 IMPLANT
BNDG COHESIVE 6X5 TAN NS LF (GAUZE/BANDAGES/DRESSINGS) IMPLANT
BNDG GAUZE DERMACEA FLUFF 4 (GAUZE/BANDAGES/DRESSINGS) ×2 IMPLANT
CLEANSER WND VASHE INSTL 34OZ (WOUND CARE) IMPLANT
COVER SURGICAL LIGHT HANDLE (MISCELLANEOUS) ×4 IMPLANT
DRAPE U-SHAPE 47X51 STRL (DRAPES) ×4 IMPLANT
DRSG ADAPTIC 3X8 NADH LF (GAUZE/BANDAGES/DRESSINGS) ×2 IMPLANT
DURAPREP 26ML APPLICATOR (WOUND CARE) ×2 IMPLANT
ELECT REM PT RETURN 9FT ADLT (ELECTROSURGICAL) ×1
ELECTRODE REM PT RTRN 9FT ADLT (ELECTROSURGICAL) ×2 IMPLANT
GAUZE PAD ABD 8X10 STRL (GAUZE/BANDAGES/DRESSINGS) ×4 IMPLANT
GAUZE SPONGE 4X4 12PLY STRL (GAUZE/BANDAGES/DRESSINGS) ×2 IMPLANT
GLOVE BIOGEL PI IND STRL 9 (GLOVE) ×2 IMPLANT
GLOVE SURG ORTHO 9.0 STRL STRW (GLOVE) ×2 IMPLANT
GOWN STRL REUS W/ TWL XL LVL3 (GOWN DISPOSABLE) ×4 IMPLANT
KIT BASIN OR (CUSTOM PROCEDURE TRAY) ×2 IMPLANT
KIT TURNOVER KIT B (KITS) ×2 IMPLANT
NS IRRIG 1000ML POUR BTL (IV SOLUTION) ×2 IMPLANT
PACK ORTHO EXTREMITY (CUSTOM PROCEDURE TRAY) ×2 IMPLANT
PAD ARMBOARD 7.5X6 YLW CONV (MISCELLANEOUS) ×4 IMPLANT
SUT ETHILON 2 0 PSLX (SUTURE) ×2 IMPLANT
TOWEL GREEN STERILE (TOWEL DISPOSABLE) ×2 IMPLANT
TUBE CONNECTING 12X1/4 (SUCTIONS) ×2 IMPLANT
YANKAUER SUCT BULB TIP NO VENT (SUCTIONS) ×2 IMPLANT

## 2023-07-20 NOTE — Anesthesia Preprocedure Evaluation (Addendum)
Anesthesia Evaluation  Patient identified by MRN, date of birth, ID band Patient awake    Reviewed: Allergy & Precautions, NPO status , Patient's Chart, lab work & pertinent test results, reviewed documented beta blocker date and time   History of Anesthesia Complications (+) PONV and history of anesthetic complications  Airway Mallampati: II  TM Distance: >3 FB Neck ROM: Full    Dental  (+) Edentulous Upper, Edentulous Lower   Pulmonary asthma , sleep apnea , COPD,  COPD inhaler, Current Smoker and Patient abstained from smoking. Unable to use CPAP   Pulmonary exam normal breath sounds clear to auscultation       Cardiovascular hypertension, Pt. on medications + angina  + CAD  Normal cardiovascular exam+ dysrhythmias  Rhythm:Regular Rate:Normal     Neuro/Psych  Headaches PSYCHIATRIC DISORDERS Anxiety Depression    Diabetic peripheral neuropathy Bilateral ringing in ears  Neuromuscular disease    GI/Hepatic Neg liver ROS,GERD  Medicated,,  Endo/Other  diabetes, Well Controlled, Type 2, Oral Hypoglycemic Agents  Hyperlipidemia  Renal/GU CRFRenal disease  negative genitourinary   Musculoskeletal  (+) Arthritis , Osteoarthritis,  Dehiscence left 5th toe amputation   Abdominal   Peds  Hematology negative hematology ROS (+) Plavix therapy- last dose   Anesthesia Other Findings   Reproductive/Obstetrics                             Anesthesia Physical Anesthesia Plan  ASA: 3  Anesthesia Plan: General   Post-op Pain Management: Tylenol PO (pre-op)*   Induction: Intravenous  PONV Risk Score and Plan: 0 and 3 and Midazolam, Ondansetron, Treatment may vary due to age or medical condition and TIVA  Airway Management Planned: LMA  Additional Equipment: None  Intra-op Plan:   Post-operative Plan: Extubation in OR  Informed Consent: I have reviewed the patients History and Physical,  chart, labs and discussed the procedure including the risks, benefits and alternatives for the proposed anesthesia with the patient or authorized representative who has indicated his/her understanding and acceptance.     Dental advisory given  Plan Discussed with: CRNA and Anesthesiologist  Anesthesia Plan Comments: (PAT note by Antionette Poles, PA-C:" 53 year old male follows with cardiology for history of orthostatic hypotension on midodrine, HLD, CAD (moderate nonobstructive LAD stenosis 2016, follow-up Myoview 2022 suggested mild inferior apical ischemic territory which was managed medically).  Most recent echo 07/2022 showed EF 55 to 60%.  Last seen by Dr. Diona Browner 05/10/2023.  Patient reported no angina at that time and no nitroglycerin use.  No changes made to management, follow-up in 6 months.  Follows with vascular surgery for history of PAD.  Recently underwent drug-coated balloon angioplasty of left popliteal artery occlusion on 06/20/2023.  Patient subsequent seen by Dr. 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  Left foot fifth ray amputation was recommended.  Other pertinent history includes current smoker associated COPD (maintained on Symbicort), OSA not on CPAP, GERD, IDDM 2 (A1c 9.0 on 04/11/2023., renal insufficiency by labs.  I-STAT from 06/20/2023 reviewed, creatinine moderately elevated at 1.60 (appears to be near recent baseline), otherwise unremarkable.  Patient will need day of surgery labs and evaluation.  EKG 02/08/23:  Normal sinus rhythm. Rate 96. Right bundle branch block. Left anterior fascicular block  TTE 08/28/22: 1. Left ventricular ejection fraction, by estimation, is 55 to 60%. The  left ventricle has normal function. The left  ventricle has no regional  wall motion abnormalities. There is mild concentric left ventricular  hypertrophy. Left ventricular diastolic  parameters are consistent with Grade I diastolic  dysfunction (impaired  relaxation).  2. Right ventricular systolic function is normal. The right ventricular  size is normal. Tricuspid regurgitation signal is inadequate for assessing  PA pressure.  3. The mitral valve leaflets appear mildly thickened for age. . The  mitral valve is abnormal. No evidence of mitral valve regurgitation. No  evidence of mitral stenosis.  4. The aortic valve is tricuspid. Aortic valve regurgitation is not  visualized. Aortic valve sclerosis is present, with no evidence of aortic  valve stenosis.  5. The inferior vena cava is dilated in size with <50% respiratory  variability, suggesting right atrial pressure of 15 mmHg.   Nuclear stress 01/13/21:  No diagnostic ST segment changes to indicate ischemia.  Small, mild intensity, apical inferior defect that is partially reversible and consistent with a mild ischemic territory.  This is a low risk study.  Nuclear stress EF: 57%   )        Anesthesia Quick Evaluation

## 2023-07-20 NOTE — Transfer of Care (Signed)
Immediate Anesthesia Transfer of Care Note  Patient: [name] [name]  Procedure(s) Performed: LEFT FOOT 4TH AND 5TH RAY AMPUTATION (Left: Foot)  Patient Location: PACU  Anesthesia Type:General  Level of Consciousness: awake, drowsy, patient cooperative, and responds to stimulation  Airway & Oxygen Therapy: Patient Spontanous Breathing  Post-op Assessment: Report given to RN, Post -op Vital signs reviewed and stable, and Patient moving all extremities X 4  Post vital signs: Reviewed and stable  Last Vitals:  Vitals Value Taken Time  BP    Temp    Pulse 87 07/20/23 0953  Resp 12 07/20/23 0953  SpO2 98 % 07/20/23 0953  Vitals shown include unfiled device data.  Last Pain:  Vitals:   07/20/23 0809  PainSc: 0-No pain         Complications: No notable events documented.

## 2023-07-20 NOTE — H&P (Signed)
[name] [name] is an 53 y.o. male.   Chief Complaint: Acute necrotic ulceration left foot fifth toe amputation. HPI: Patient is a 53 year old gentleman who is status post revascularization to the left lower extremity January 20. Patient underwent fifth toe amputation on January 24. Patient states he recently started smelling an odor.  Patient presents with necrotic dehiscence fifth toe amputation left foot.  Past Medical History:  Diagnosis Date   Anxiety    Arthritis    Asthma    CAD (coronary artery disease)    a. 03/2015 Cath: LM nl, LAD mild diff dzs throughout w/ 50p/d, LCX diff dzs, 39m, RCA diff dzs, 47m-->Med rx; b. 12/2020 MV: EF 57%, small/mild apical inferior defect w/ partial rev/mild ischemia.   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, bilateral    Sleep apnea 2016   does not use CPAP   Type 2 diabetes mellitus (HCC)     Past Surgical History:  Procedure Laterality Date   ABDOMINAL AORTOGRAM W/LOWER EXTREMITY N/A 06/20/2023   Procedure: ABDOMINAL AORTOGRAM W/LOWER EXTREMITY;  Surgeon: Daria Pastures, MD;  Location: [facility] INVASIVE CV LAB;  Service: Cardiovascular;  Laterality: N/A;   AMPUTATION Left 06/24/2023   Procedure: LEFT 5TH TOE AMPUTATION;  Surgeon: Nadara Mustard, MD;  Location: [facility] OR;  Service: Orthopedics;  Laterality: Left;   ANKLE SURGERY Right 1982   "had extra bones in there; took them out"   APPENDECTOMY  1975   BIOPSY  12/26/2018   Procedure: BIOPSY;  Surgeon: Malissa Hippo, MD;  Location: [facility];  Service: Endoscopy;;  duodenal biopsies   BIOPSY  03/23/2019   Procedure: BIOPSY;  Surgeon: Malissa Hippo, MD;  [facility];   Service: Endoscopy;;  esophagus   CARDIAC CATHETERIZATION N/A 03/28/2015   Procedure: Left Heart Cath and Coronary Angiography;  Surgeon: Yates Decamp, MD;  Location: [facility] INVASIVE CV LAB;  Service: Cardiovascular;  Laterality: N/A;   CARDIAC CATHETERIZATION N/A 03/28/2015   Procedure: Intravascular Pressure Wire/FFR Study;  Surgeon: Yates Decamp, MD;  Location: [facility] INVASIVE CV LAB;  Service: Cardiovascular;  Laterality: N/A;   CARPAL TUNNEL RELEASE Left ~ 2008   COLONOSCOPY WITH ESOPHAGOGASTRODUODENOSCOPY (EGD)     COLONOSCOPY WITH PROPOFOL N/A 12/24/2019   Procedure: COLONOSCOPY WITH PROPOFOL;  Surgeon: Malissa Hippo, MD;  Location: [facility];  Service: Endoscopy;  Laterality: N/A;  955   ELBOW FRACTURE SURGERY Left ~ 2008   ESOPHAGEAL MANOMETRY N/A 09/16/2021   Procedure: ESOPHAGEAL MANOMETRY (EM);  Surgeon: Shellia Cleverly, DO;  Location: [facility];  Service: Endoscopy;  Laterality: N/A;   ESOPHAGOGASTRODUODENOSCOPY N/A 12/26/2018   Procedure: ESOPHAGOGASTRODUODENOSCOPY (EGD);  Surgeon: Malissa Hippo, MD;  Location: [facility];  Service: Endoscopy;  Laterality: N/A;   ESOPHAGOGASTRODUODENOSCOPY (EGD) WITH PROPOFOL N/A 03/23/2019   Procedure: ESOPHAGOGASTRODUODENOSCOPY (EGD) WITH PROPOFOL;  Surgeon: Malissa Hippo, MD;  Location: [facility];  Service: Endoscopy;  Laterality: N/A;  7:30   FRACTURE SURGERY     ankle and elbow   PERIPHERAL VASCULAR BALLOON ANGIOPLASTY  06/20/2023   Procedure: PERIPHERAL VASCULAR BALLOON  ANGIOPLASTY;  Surgeon: Daria Pastures, MD;  Location: [facility] INVASIVE CV LAB;  Service: Cardiovascular;;   PH IMPEDANCE STUDY N/A 09/16/2021   Procedure: PH IMPEDANCE STUDY;  Surgeon: Shellia Cleverly, DO;  Location: [facility];  Service: Endoscopy;  Laterality: N/A;   PILONIDAL CYST EXCISION N/A 03/24/2017   Procedure: EXCISION CHRONIC  PILONIDAL ABSCESS;  Surgeon: [name] Miyamoto, MD;  Location: [facility];  Service: General;  Laterality: N/A;   SCROTAL EXPLORATION  N/A 11/13/2020   Procedure: EXCISION OF SEBACEOUS CYSTS, SCROTUM;  Surgeon: Malen Gauze, MD;  Location: [facility];  Service: Urology;  Laterality: N/A;   TENDON REPAIR Left ~ 2004   "main tendon in my ankle"    Family History  Problem Relation Age of Onset   Diabetes Mother    Hypertension Mother    Cancer Mother    Throat cancer Mother    Diabetes Father    Hypertension Father    Hyperlipidemia Father    Congestive Heart Failure Sister    Colon cancer Neg Hx    Pancreatic cancer Neg Hx    Liver disease Neg Hx    Social History:  reports that he has been smoking cigarettes. He has a 34 pack-year smoking history. He has never been exposed to tobacco smoke. He has never used smokeless tobacco. He reports that he does not drink alcohol and does not use drugs.  Allergies:  Allergies  Allergen Reactions   Prilosec Otc [Omeprazole Magnesium] Swelling    Face swells, no breathing impairment   Bee Venom Swelling   Morphine     Itching at IV site,  N+V   Nexium [Esomeprazole] Swelling    Face swells, no breathing impairment    No medications prior to admission.    No results found for this or any previous visit (from the past 48 hours). No results found.  Review of Systems  All other systems reviewed and are negative.   Height 5\' 8"  (1.727 m), weight 68.5 kg. Physical Exam  Patient is alert, oriented, no adenopathy, well-dressed, normal affect, normal respiratory effort.   Patient is full weightbearing in a postoperative shoe he states he has crutches at home.   Examination patient has a palpable dorsalis pedis pulse and still has good circulation status post revascularization.  Examination of the wound from the fifth toe amputation shows necrotic tissue with exposed fourth metatarsal head.  Despite patient's macro circulation improvement he has microcirculatory disease. Assessment/Plan 1. Gangrene of toe of left foot (HCC)       Plan: Will plan for fourth and fifth  ray amputation.  Risks and benefits were discussed including risk of the wound not healing need for additional surgery.  Patient states he understands wished to proceed at this time.  Recommended minimizing weightbearing and using crutches.  Nadara Mustard, MD 07/20/2023, 6:40 AM

## 2023-07-20 NOTE — Op Note (Signed)
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 Application of vancomycin powder 1 g.  SURGEON:  Nadara Mustard, MD  PHYSICIAN ASSISTANT:None ANESTHESIA:   General  PREOPERATIVE INDICATIONS:  [name] [name] is a  53 y.o. 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    The risks benefits and alternatives were discussed with the patient preoperatively including but not limited to the risks of infection, bleeding, nerve injury, cardiopulmonary complications, the need for revision surgery, among others, and the patient was willing to proceed.  OPERATIVE IMPLANTS:   * No implants in log *  @ENCIMAGES @  OPERATIVE FINDINGS: Patient had petechial bleeding along the wound edges.  No deep abscess wound margins were clear.  OPERATIVE PROCEDURE: Patient was brought the operating room and underwent a general anesthetic.  After adequate levels anesthesia were obtained patient's left lower extremity was prepped using DuraPrep draped into a sterile field a timeout was called.  A elliptical incision was made around the ulcerative tissue to resect both the fourth and fifth rays.  This left a wound that was 10 x 4 cm.  Tissue margins were undermined for allow for tissue advancement.  A oscillating saw was used to resect the fourth and fifth metatarsals through the base.  The necrotic tissue and rays were resected in 1 block of tissue.  A 21 blade knife was then used to further debride the nonviable soft tissue back to healthy viable tissue.  Wound margins had petechial bleeding.  Wound was irrigated with Vashe irrigation electrocautery was used hemostasis.  The wound bed was filled with 1 g vancomycin powder.  Local tissue  transfer for wound closure was used to close the wound 10 x 4 cm with 2-0 nylon after undermining the tissue margins.  Sterile dressing was applied patient was extubated taken the PACU in stable condition.   DISCHARGE PLANNING:  Antibiotic duration: Preoperative antibiotics plus topical vancomycin  Weightbearing: Touchdown weightbearing on the left.  Recommend patient obtain a wheelchair.  Pain medication: Prescription for Vicodin  Dressing care/ Wound VAC: Dry dressing reinforce as needed  Ambulatory devices: Patient has crutches recommended a wheelchair.  Discharge to: Home.  Follow-up: In the office 1 week post operative.

## 2023-07-20 NOTE — Progress Notes (Signed)
Orthopedic Tech Progress Note Patient Details:  [name] [name] 1970-10-12 [id number]  Ortho Devices Type of Ortho Device: Postop shoe/boot Ortho Device/Splint Location: LLE Ortho Device/Splint Interventions: Application   Post Interventions Patient Tolerated: Well  [name] [name] 07/20/2023, 10:53 AM

## 2023-07-20 NOTE — Anesthesia Postprocedure Evaluation (Signed)
Anesthesia Post Note  Patient: Justinn L Wargo  Procedure(s) Performed: LEFT FOOT 4TH AND 5TH RAY AMPUTATION (Left: Foot)     Patient location during evaluation: PACU Anesthesia Type: General Level of consciousness: awake and alert and oriented Pain management: pain level controlled Vital Signs Assessment: post-procedure vital signs reviewed and stable Respiratory status: spontaneous breathing, nonlabored ventilation and respiratory function stable Cardiovascular status: blood pressure returned to baseline and stable Postop Assessment: no apparent nausea or vomiting Anesthetic complications: no   No notable events documented.  Last Vitals:  Vitals:   07/20/23 1015 07/20/23 1020  BP: (!) 168/94 (!) 174/92  Pulse: 95 95  Resp: 15 16  Temp:  36.5 C  SpO2: 100% 99%    Last Pain:  Vitals:   07/20/23 1020  PainSc: 3                  Shreyan Hinz A.

## 2023-07-20 NOTE — Anesthesia Procedure Notes (Signed)
Procedure Name: LMA Insertion Date/Time: 07/20/2023 9:26 AM  Performed by: Ayesha Rumpf, CRNAPre-anesthesia Checklist: Patient identified, Emergency Drugs available, Suction available and Patient being monitored Patient Re-evaluated:Patient Re-evaluated prior to induction Oxygen Delivery Method: Circle System Utilized Preoxygenation: Pre-oxygenation with 100% oxygen Induction Type: IV induction Ventilation: Mask ventilation without difficulty LMA: LMA inserted LMA Size: 4.0 Number of attempts: 1 Airway Equipment and Method: Bite block Placement Confirmation: positive ETCO2 Tube secured with: Tape Dental Injury: Teeth and Oropharynx as per pre-operative assessment

## 2023-07-20 NOTE — Progress Notes (Signed)
Pt's CBG 161. Pt refuses insulin at this time. States : "I was told to take insulin if my blood sugar is more than 174"

## 2023-07-21 ENCOUNTER — Encounter (HOSPITAL_COMMUNITY): Payer: Self-pay | Admitting: Orthopedic Surgery

## 2023-07-22 ENCOUNTER — Other Ambulatory Visit: Payer: Self-pay

## 2023-07-22 DIAGNOSIS — I70222 Atherosclerosis of native arteries of extremities with rest pain, left leg: Secondary | ICD-10-CM

## 2023-07-26 ENCOUNTER — Ambulatory Visit (INDEPENDENT_AMBULATORY_CARE_PROVIDER_SITE_OTHER): Payer: MEDICAID | Admitting: Family

## 2023-07-26 DIAGNOSIS — S98132A Complete traumatic amputation of one left lesser toe, initial encounter: Secondary | ICD-10-CM

## 2023-07-26 DIAGNOSIS — I96 Gangrene, not elsewhere classified: Secondary | ICD-10-CM

## 2023-07-26 DIAGNOSIS — Z89422 Acquired absence of other left toe(s): Secondary | ICD-10-CM

## 2023-07-26 MED ORDER — HYDROCODONE-ACETAMINOPHEN 5-325 MG PO TABS: 1.0000 | ORAL_TABLET | Freq: Four times a day (QID) | ORAL | 0 refills | Status: DC | PRN

## 2023-07-27 ENCOUNTER — Encounter: Payer: Self-pay | Admitting: Family

## 2023-07-27 NOTE — Progress Notes (Signed)
 Post-Op Visit Note   Patient: [name] [name]           Date of Birth: May 28, 1971           MRN: [id number] Visit Date: 07/26/2023 PCP: Patient, No Pcp Per  Chief Complaint:  Chief Complaint  Patient presents with   Left Foot - Routine Post Op    06/24/23 left 5th toe amputation 2.19/25 left 4th and 5th ray amputatation    HPI:  HPI The patient is a 53 year old gentleman who is seen status post left fourth and fifth ray amputations February 19 after necrosis of his initial amputation site, fifth toe amputation. Ortho Exam On examination left fourth fifth ray amputation this is approximated with sutures there is some surrounding maceration there is no gaping or purulence no erythema  Visit Diagnoses: No diagnosis found.  Plan: Begin daily dose of cleansing.  Dry dressings.  Elevate for swelling.  Minimize weightbearing.  Follow-Up Instructions: No follow-ups on file.   Imaging: No results found.  Orders:  No orders of the defined types were placed in this encounter.  Meds ordered this encounter  Medications   HYDROcodone-acetaminophen (NORCO/VICODIN) 5-325 MG tablet    Sig: Take 1 tablet by mouth every 6 (six) hours as needed.    Dispense:  30 tablet    Refill:  0     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) 07/12/2023   Gangrene of toe of left foot (HCC) 06/24/2023   Type 2 diabetes mellitus with diabetic polyneuropathy, with long-term current use of insulin (HCC) 04/11/2023   Type 2 diabetes mellitus with hyperglycemia, with long-term current use of insulin (HCC) 04/11/2023   Diabetes mellitus (HCC) 04/11/2023   PTSD (post-traumatic stress disorder) 03/14/2023   Aortic valve sclerosis 09/16/2022   Hypoglycemia 08/27/2022   COPD GOLD 1/ AB 06/11/2022   Syncope and collapse 12/13/2021   Bifascicular block 12/13/2021   SIRS (systemic inflammatory response syndrome) (HCC) 12/13/2021   Renal  insufficiency 12/13/2021   Esophageal motility disorder 12/13/2021   Heartburn    Scrotal abscess 11/19/2020   Severe sepsis (HCC) 09/03/2020   Non-adherence to medical treatment 05/27/2020   Barrett's esophagus 01/23/2019   Diarrhea 01/23/2019   MDD (major depressive disorder), severe (HCC) 12/17/2018   DKA, type 2 (HCC) 11/07/2018   High anion gap metabolic acidosis 03/22/2018   Epigastric abdominal pain 03/22/2018   Nausea vomiting and diarrhea 03/22/2018   AKI (acute kidney injury) (HCC) 03/22/2018   Uncontrolled type 2 diabetes mellitus with hyperglycemia (HCC) 03/22/2018   ARF (acute renal failure) (HCC) 05/30/2017   Mixed hyperlipidemia 03/31/2017   Diabetic polyneuropathy (HCC) 03/31/2017   Vitamin B12 deficiency 03/31/2017   Vitamin D deficiency 03/31/2017   Right bundle branch block 02/14/2017   DKA (diabetic ketoacidosis) (HCC) 10/15/2016   Gastroesophageal reflux disease 10/15/2016   Benign hypertension 10/15/2016   Leukocytosis 11/25/2015   Intractable nausea and vomiting 11/25/2015   Uncontrolled type 2 diabetes mellitus with hypoglycemia, with long-term current use of insulin (HCC) 11/25/2015   Dental abscess 11/25/2015   Nausea and vomiting 11/25/2015   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 03/2015 Cath: LM nl, LAD mild diff dzs throughout w/ 50p/d, LCX diff dzs, 34m, RCA diff dzs, 56m-->Med rx; b. 12/2020 MV: EF 57%, small/mild apical inferior defect w/ partial rev/mild ischemia.   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, bilateral    Sleep apnea 2016   does not use CPAP   Type 2 diabetes mellitus (HCC)     Family History  Problem Relation Age of Onset   Diabetes Mother    Hypertension Mother    Cancer Mother    Throat cancer Mother    Diabetes Father    Hypertension Father    Hyperlipidemia Father    Congestive Heart Failure Sister    Colon cancer Neg Hx    Pancreatic cancer Neg Hx    Liver disease Neg Hx     Past Surgical History:  Procedure Laterality Date   ABDOMINAL AORTOGRAM W/LOWER EXTREMITY N/A 06/20/2023   Procedure: ABDOMINAL AORTOGRAM W/LOWER EXTREMITY;  Surgeon: Daria Pastures, MD;  Location: [facility];  Service: Cardiovascular;  Laterality: N/A;   AMPUTATION Left 06/24/2023   Procedure: LEFT 5TH TOE AMPUTATION;  Surgeon: Nadara Mustard, MD;  Location: [facility] OR;  Service: Orthopedics;  Laterality: Left;   AMPUTATION Left 07/20/2023   Procedure: LEFT FOOT 4TH AND 5TH RAY AMPUTATION;  Surgeon: Nadara Mustard, MD;  Location: [facility] OR;  Service: Orthopedics;  Laterality: Left;   ANKLE SURGERY Right 1982   "had extra bones in there; took them out"   APPENDECTOMY  1975   BIOPSY  12/26/2018   Procedure: BIOPSY;  Surgeon: Malissa Hippo, MD;  Location: [facility];  Service: Endoscopy;;  duodenal biopsies   BIOPSY  03/23/2019   Procedure: BIOPSY;  Surgeon: Malissa Hippo, MD;  Location: [facility];  Service: Endoscopy;;  esophagus   CARDIAC CATHETERIZATION N/A 03/28/2015   Procedure: Left Heart Cath and Coronary Angiography;  Surgeon: Yates Decamp, MD;  Location: [facility] INVASIVE CV LAB;  Service: Cardiovascular;  Laterality: N/A;   CARDIAC CATHETERIZATION N/A 03/28/2015   Procedure: Intravascular Pressure Wire/FFR Study;  Surgeon: Yates Decamp, MD;  Location: [facility] INVASIVE  CV LAB;  Service: Cardiovascular;  Laterality: N/A;   CARPAL TUNNEL RELEASE Left ~ 2008   COLONOSCOPY WITH ESOPHAGOGASTRODUODENOSCOPY (EGD)     COLONOSCOPY WITH PROPOFOL N/A 12/24/2019   Procedure: COLONOSCOPY WITH PROPOFOL;  Surgeon: Malissa Hippo, MD;  Location: [facility];  Service: Endoscopy;  Laterality: N/A;  955   ELBOW FRACTURE SURGERY Left ~ 2008   ESOPHAGEAL MANOMETRY N/A 09/16/2021   Procedure: ESOPHAGEAL MANOMETRY (EM);  Surgeon: Shellia Cleverly, DO;  Location: [facility];  Service: Endoscopy;  Laterality: N/A;   ESOPHAGOGASTRODUODENOSCOPY N/A 12/26/2018   Procedure: ESOPHAGOGASTRODUODENOSCOPY (EGD);  Surgeon: Malissa Hippo, MD;  Location: [facility];  Service: Endoscopy;  Laterality: N/A;   ESOPHAGOGASTRODUODENOSCOPY (EGD) WITH PROPOFOL N/A 03/23/2019   Procedure: ESOPHAGOGASTRODUODENOSCOPY (EGD) WITH PROPOFOL;  Surgeon: Malissa Hippo, MD;  Location: [facility];  Service: Endoscopy;  Laterality: N/A;  7:30   FRACTURE SURGERY     ankle and elbow   PERIPHERAL VASCULAR BALLOON ANGIOPLASTY  06/20/2023   Procedure: PERIPHERAL VASCULAR BALLOON ANGIOPLASTY;  Surgeon: Daria Pastures, MD;  Location: [facility];  Service: Cardiovascular;;   PH IMPEDANCE STUDY N/A 09/16/2021   Procedure: PH IMPEDANCE STUDY;  Surgeon: Shellia Cleverly, DO;  Location: [facility];  Service: Endoscopy;  Laterality: N/A;   PILONIDAL CYST EXCISION N/A 03/24/2017   Procedure: EXCISION CHRONIC  PILONIDAL ABSCESS;  Surgeon: [name] Miyamoto, MD;  Location: [facility] ORS;  Service: General;  Laterality: N/A;   SCROTAL EXPLORATION N/A 11/13/2020   Procedure: EXCISION OF SEBACEOUS CYSTS, SCROTUM;  Surgeon: Malen Gauze, MD;  Location: [facility];  Service: Urology;  Laterality: N/A;   TENDON REPAIR Left ~ 2004   "main tendon in my ankle"   Social History   Occupational History   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  Tobacco Use   Smoking status: Every Day    Current packs/day: 1.00     Average packs/day: 1 pack/day for 34.0 years (34.0 ttl pk-yrs)    Types: Cigarettes    Passive exposure: Never   Smokeless tobacco: Never   Tobacco comments:    1/2 ppd -1 ppd - patient states "trying to cut back" as of 06/18/23 km  Vaping Use   Vaping status: Never Used  Substance and Sexual Activity   Alcohol use: Never    Alcohol/week: 0.0 standard drinks of alcohol   Drug use: Never   Sexual activity: Not Currently

## 2023-07-29 ENCOUNTER — Ambulatory Visit (INDEPENDENT_AMBULATORY_CARE_PROVIDER_SITE_OTHER): Payer: MEDICAID | Admitting: Physician Assistant

## 2023-07-29 ENCOUNTER — Ambulatory Visit (HOSPITAL_COMMUNITY)
Admission: RE | Admit: 2023-07-29 | Discharge: 2023-07-29 | Disposition: A | Discharge status: Home / Self Care | Payer: MEDICAID | Source: Ambulatory Visit | Attending: Vascular Surgery | Admitting: Vascular Surgery

## 2023-07-29 ENCOUNTER — Ambulatory Visit (INDEPENDENT_AMBULATORY_CARE_PROVIDER_SITE_OTHER)
Admit: 2023-07-29 | Discharge: 2023-07-29 | Disposition: A | Discharge status: Home / Self Care | Payer: MEDICAID | Attending: Vascular Surgery | Admitting: Vascular Surgery

## 2023-07-29 VITALS — BP 166/112 | HR 85 | Temp 98.1°F | Resp 20 | Ht 68.0 in | Wt 153.2 lb

## 2023-07-29 DIAGNOSIS — I70222 Atherosclerosis of native arteries of extremities with rest pain, left leg: Secondary | ICD-10-CM

## 2023-07-29 LAB — VAS US ABI WITH/WO TBI
Left ABI: 0.91
Right ABI: 0.99

## 2023-07-29 NOTE — Progress Notes (Signed)
 Office Note   History of Present Illness   [name] [name] is a 53 y.o. (24-Nov-1970) male who presents for post angio follow-up.  On 06/28/2023 he underwent left lower extremity angiogram with drug-coated balloon angioplasty of the left popliteal artery by Dr. Hetty Blend.  This was done for critical limb ischemia with left fifth toe gangrene.  He subsequently underwent left fifth toe amputation on 06/24/2023 by Dr. Lajoyce Corners.  His amputation site got infected and he required revision with amputation of his left fourth and fifth toes on 07/20/2023.  He returns today for angio follow-up.  He says that he recently had an incision check with orthopedics.  He was told that his amputation site is healing appropriately with no signs of infection.  He will be seeing their office again in 3 weeks for suture removal.  He endorses some aching at his incision site.  He denies any other left foot pain.  He has been keeping his amputation site clean, dry, and bandage.  He denies any fevers or puslike drainage.   Current Outpatient Medications  Medication Sig Dispense Refill   Accu-Chek FastClix Lancets MISC Check blood sugar 3 times daily 100 each 11   acetaminophen (TYLENOL) 500 MG tablet Take 1,000 mg by mouth 2 (two) times daily as needed for moderate pain, fever or headache.     albuterol (VENTOLIN HFA) 108 (90 Base) MCG/ACT inhaler Inhale 2 puffs into the lungs every 6 (six) hours as needed for wheezing or shortness of breath.     ALPRAZolam (XANAX) 1 MG tablet Take 1 mg by mouth at bedtime as needed for anxiety or sleep.     amLODipine (NORVASC) 5 MG tablet Take 5 mg by mouth every evening.     ARIPiprazole (ABILIFY) 10 MG tablet Take 10 mg by mouth in the morning.     aspirin EC 81 MG tablet Take 81 mg by mouth daily with breakfast.     atorvastatin (LIPITOR) 80 MG tablet TAKE 1 Tablet BY MOUTH ONCE EVERY DAY (Patient taking differently: Take 80 mg by mouth in the morning.) 90 tablet 0   Blood Glucose  Monitoring Suppl (ACCU-CHEK GUIDE) w/Device KIT Use to check blood sugar 3 times daily 1 kit 0   budesonide-formoterol (SYMBICORT) 80-4.5 MCG/ACT inhaler Take 2 puffs first thing in am and then another 2 puffs about 12 hours later. (Patient taking differently: Inhale 2 puffs into the lungs 2 (two) times daily as needed (wheeze, shortness of breath).) 1 each 12   clopidogrel (PLAVIX) 75 MG tablet Take 1 tablet (75 mg total) by mouth daily. 30 tablet 11   Continuous Glucose Sensor (FREESTYLE LIBRE 2 PLUS SENSOR) MISC 1 Device by Does not apply route every 14 (fourteen) days. 6 each 3   Continuous Glucose Sensor (FREESTYLE LIBRE 2 SENSOR) MISC Change sensors every 14 days 6 each 3   Continuous Glucose Sensor (FREESTYLE LIBRE 3 PLUS SENSOR) MISC 1 Device by Other route every 14 (fourteen) days. Change sensor every 15 days. 6 each 3   ezetimibe (ZETIA) 10 MG tablet Take 1 tablet (10 mg total) by mouth daily. (Patient taking differently: Take 10 mg by mouth every evening.) 90 tablet 3   famotidine (PEPCID) 40 MG tablet Take 1 tablet (40 mg total) by mouth 2 (two) times daily. (Patient taking differently: Take 40 mg by mouth 2 (two) times daily as needed for indigestion or heartburn.) 60 tablet 3   glucose blood (ACCU-CHEK GUIDE) test strip Check blood sugar  3 times daily 100 each 12   glucose blood (TRUE METRIX BLOOD GLUCOSE TEST) test strip Check blood sugar 3 times daily 100 each 12   HYDROcodone-acetaminophen (NORCO/VICODIN) 5-325 MG tablet Take 1 tablet by mouth every 6 (six) hours as needed. 30 tablet 0   insulin aspart (NOVOLOG FLEXPEN) 100 UNIT/ML FlexPen Start CF: NovoLog (BG-130/45) TIDQAC  Max daily 30 units (Patient taking differently: Inject 0-7 Units into the skin 3 (three) times daily before meals. Start CF: NovoLog (BG-130/45) TIDQAC  Max daily 30 units) 30 mL 3   Insulin Disposable Pump (OMNIPOD 5 LIBRE2 PLUS G6 PODS) MISC 1 DEVICE BY DOES NOT APPLY ROUTE EVERY 3 (THREE) DAYS. 30 each 3    Insulin Disposable Pump (OMNIPOD 5 LIBRE2 PLUS G6) KIT 1 Device by Does not apply route every 3 (three) days. 1 kit 0   insulin glargine (LANTUS SOLOSTAR) 100 UNIT/ML Solostar Pen Inject 14 Units into the skin daily. 15 mL 11   Insulin Pen Needle 32G X 4 MM MISC 1 Device by Does not apply route in the morning, at noon, in the evening, and at bedtime. 400 each 3   linaclotide (LINZESS) 145 MCG CAPS capsule Take 1 capsule (145 mcg total) by mouth daily before breakfast. 90 capsule 3   midodrine (PROAMATINE) 10 MG tablet Take 1 tablet (10 mg total) by mouth 3 (three) times daily. 270 tablet 3   mupirocin ointment (BACTROBAN) 2 % Apply topically 2 (two) times daily.     nitroGLYCERIN (NITROSTAT) 0.4 MG SL tablet Place 1 tablet (0.4 mg total) under the tongue every 5 (five) minutes as needed for chest pain. 25 tablet 3   polyethylene glycol (MIRALAX / GLYCOLAX) 17 g packet Take 17 g by mouth daily. 14 each 0   prochlorperazine (COMPAZINE) 10 MG tablet Take 1 tablet (10 mg total) by mouth every 6 (six) hours as needed for nausea or vomiting. 20 tablet 0   promethazine (PHENERGAN) 25 MG tablet Take 25 mg by mouth daily as needed for nausea or vomiting.     sertraline (ZOLOFT) 100 MG tablet Take 2 tablets (200 mg total) by mouth at bedtime.     sulfamethoxazole-trimethoprim (BACTRIM DS) 800-160 MG tablet Take 1 tablet by mouth 2 (two) times daily. 20 tablet 0   traZODone (DESYREL) 50 MG tablet Take 50 mg by mouth at bedtime.     gabapentin (NEURONTIN) 300 MG capsule Take 1 capsule (300 mg total) by mouth every 8 (eight) hours as needed. (Patient not taking: Reported on 07/12/2023) 30 capsule 0   metFORMIN (GLUCOPHAGE-XR) 500 MG 24 hr tablet Take 1 tablet (500 mg total) by mouth 2 (two) times daily with a meal. (Patient not taking: Reported on 07/12/2023) 180 tablet 3   No current facility-administered medications for this visit.    REVIEW OF SYSTEMS (negative unless checked):   Cardiac:  []  Chest pain or  chest pressure? []  Shortness of breath upon activity? []  Shortness of breath when lying flat? []  Irregular heart rhythm?  Vascular:  []  Pain in calf, thigh, or hip brought on by walking? []  Pain in feet at night that wakes you up from your sleep? []  Blood clot in your veins? []  Leg swelling?  Pulmonary:  []  Oxygen at home? []  Productive cough? []  Wheezing?  Neurologic:  []  Sudden weakness in arms or legs? []  Sudden numbness in arms or legs? []  Sudden onset of difficult speaking or slurred speech? []  Temporary loss of vision in one eye? []   Problems with dizziness?  Gastrointestinal:  []  Blood in stool? []  Vomited blood?  Genitourinary:  []  Burning when urinating? []  Blood in urine?  Psychiatric:  []  Major depression  Hematologic:  []  Bleeding problems? []  Problems with blood clotting?  Dermatologic:  []  Rashes or ulcers?  Constitutional:  []  Fever or chills?  Ear/Nose/Throat:  []  Change in hearing? []  Nose bleeds? []  Sore throat?  Musculoskeletal:  []  Back pain? []  Joint pain? []  Muscle pain?   Physical Examination   Vitals:   07/29/23 1004  BP: (!) 166/112  Pulse: 85  Resp: 20  Temp: 98.1 F (36.7 C)  TempSrc: Temporal  SpO2: 94%  Weight: 153 lb 3.2 oz (69.5 kg)  Height: 5\' 8"  (1.727 m)   Body mass index is 23.29 kg/m.  General:  WDWN in NAD; vital signs documented above Gait: Not observed HENT: WNL, normocephalic Pulmonary: normal non-labored breathing , without rales, rhonchi,  wheezing Cardiac: regular Abdomen: soft, NT, no masses Skin: without rashes Vascular Exam/Pulses: 2+ left DP pulse Extremities: healed left dorsal foot wound. Left 4-5th toe amp healing appropriately  Musculoskeletal: no muscle wasting or atrophy  Neurologic: A&O X 3;  No focal weakness or paresthesias are detected Psychiatric:  The pt has Normal affect.    Non-Invasive Vascular imaging   ABI (07/29/2023) R:  ABI: 0.99 (NA),  PT: mono DP: bi TBI:  0.65 L:  ABI: 0.91 (N/A),  PT: bi DP: mono TBI: 0.65   LLE Arterial Duplex (07/29/2023) ----+-----------------+  LEFT      PSV cm/sRatioStenosis       Waveform  Comments           +-----------+--------+-----+---------------+----------+-----------------+  CFA Prox   172                         triphasic                    +-----------+--------+-----+---------------+----------+-----------------+  CFA Distal 142                         triphasic                    +-----------+--------+-----+---------------+----------+-----------------+  DFA       194          30-49% stenosistriphasic homogenous plaque  +-----------+--------+-----+---------------+----------+-----------------+  SFA Prox   272          50-74% stenosismonophasichomogenous plaque  +-----------+--------+-----+---------------+----------+-----------------+  SFA Mid    161                         monophasic                   +-----------+--------+-----+---------------+----------+-----------------+  SFA Distal 187          30-49% stenosismonophasic                   +-----------+--------+-----+---------------+----------+-----------------+  POP Prox   463          75-99% stenosismonophasic                   +-----------+--------+-----+---------------+----------+-----------------+  POP Mid    270          50-74% stenosisbiphasic                     +-----------+--------+-----+---------------+----------+-----------------+  POP Distal 288  50-74% stenosismonophasic                   +-----------+--------+-----+---------------+----------+-----------------+  ATA Distal 88                          monophasic                   +-----------+--------+-----+---------------+----------+-----------------+  PTA Distal 60                          monophasic                   +-----------+--------+-----+---------------+----------+-----------------+  PERO  Distal37                          monophasicdampened           +-----------+--------+-----+---------------+----------+-----------------+    Medical Decision Making   [name] [name] is a 53 y.o. male who presents for angio follow up  The patient recently underwent left lower extremity angiogram with drug-coated balloon angioplasty of the popliteal artery.  This was done for critical limb ischemia of the left lower extremity.  He subsequently has required left fourth and fifth toe amputations. Based on the patient's vascular studies, his ABIs on the right are 0.99.  His ABIs on the left are 0.91 Arterial duplex demonstrates patent arterial flow in the left lower extremity.  There appears to be greater than 75% stenosis in the proximal popliteal artery.  There is also nonflow limiting stenosis in the distal SFA and mid/distal popliteal artery On exam he has a 2+ left DP pulse.  His left fourth/fifth toe amputation site is healing appropriately without signs of infection.  The wound on the dorsal portion of his left foot is healed Although the patient has greater than 75% stenosis in the proximal popliteal artery, we will continue to monitor his studies rather than repeat intervention at this time.  His amputation site is healing well with his current vascular status.  Dr. Hetty Blend explained to the patient that this stenosis may be due to inflammation after revascularization.  We have also explained to the patient that he may require repeat intervention if his amputation site does not heal He should continue wound care per orthopedics.  He can follow-up with our office in 3 months with repeat ABIs and left lower extremity arterial duplex   Loel Dubonnet PA-C [facility] Office: [phone]  Clinic MD: Hetty Blend

## 2023-08-08 ENCOUNTER — Ambulatory Visit (INDEPENDENT_AMBULATORY_CARE_PROVIDER_SITE_OTHER): Payer: MEDICAID | Admitting: Orthopedic Surgery

## 2023-08-08 ENCOUNTER — Encounter: Payer: Self-pay | Admitting: Orthopedic Surgery

## 2023-08-08 DIAGNOSIS — S98132A Complete traumatic amputation of one left lesser toe, initial encounter: Secondary | ICD-10-CM

## 2023-08-08 DIAGNOSIS — I96 Gangrene, not elsewhere classified: Secondary | ICD-10-CM

## 2023-08-08 DIAGNOSIS — Z89422 Acquired absence of other left toe(s): Secondary | ICD-10-CM

## 2023-08-08 MED ORDER — DOXYCYCLINE HYCLATE 100 MG PO TABS
100.0000 mg | ORAL_TABLET | Freq: Two times a day (BID) | ORAL | 0 refills | Status: DC
Start: 2023-08-08 — End: 2023-09-06

## 2023-08-08 NOTE — Progress Notes (Signed)
 Office Visit Note   Patient: [name] [name]           Date of Birth: 03/19/1971           MRN: [id number] Visit Date: 08/08/2023              Requested by: No referring provider defined for this encounter. PCP: Patient, No Pcp Per  Chief Complaint  Patient presents with   Left Foot - Routine Post Op    06/24/23 left 5th toe amputation 2.19/25 left 4th and 5th ray amputatation      HPI: Patient is a 53 year old gentleman status post fourth and fifth ray amputation February 19.  Patient presented with concerns for infection.  Assessment & Plan: Visit Diagnoses:  1. Gangrene of toe of left foot (HCC)   2. 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)     Plan: Recommended decreasing weightbearing prescription provided for doxycycline.  Dial soap cleansing and dry dressing changes daily.  Follow-Up Instructions: Return in about 2 weeks (around 08/22/2023).   Ortho Exam  Patient is alert, oriented, no adenopathy, well-dressed, normal affect, normal respiratory effort. Examination patient has a strong dorsalis pedis pulse.  The lateral foot wound is healing well except for the center of the wound has some mild ischemic changes with a small open wound of clear drainage.  No cellulitis no purulence.  Imaging: No results found. No images are attached to the encounter.  Labs: Lab Results  Component Value Date   HGBA1C 8.7 (A) 07/12/2023   HGBA1C 9.0 (A) 04/11/2023   HGBA1C 13.2 (H) 08/29/2022   ESRSEDRATE 20 (H) 06/18/2022   ESRSEDRATE 1 08/20/2015   CRP <0.5 06/18/2022   CRP 2.5 01/23/2019   CRP <0.5 08/20/2015   REPTSTATUS 06/18/2023 FINAL 06/13/2023   CULT  06/13/2023    NO GROWTH 5 DAYS Performed at [facility], [address], [place], [place] [address]      Lab Results  Component Value Date   ALBUMIN 3.2 (L) 08/27/2022   ALBUMIN 2.8 (L) 07/26/2022   ALBUMIN 4.1 07/06/2022    Lab Results  Component Value Date   MG 1.8 02/08/2023   MG 2.0  08/29/2022   MG 1.5 (L) 08/28/2022   No results found for: "VD25OH"  No results found for: "PREALBUMIN"    Latest Ref Rng & Units 07/20/2023    7:58 AM 06/20/2023    5:32 AM 06/13/2023    6:05 PM  CBC EXTENDED  WBC 4.0 - 10.5 K/uL 14.1   13.2   RBC 4.22 - 5.81 MIL/uL 4.08   4.59   Hemoglobin 13.0 - 17.0 g/dL 19.1  47.8  29.5   HCT 39.0 - 52.0 % 37.1  40.0  43.5   Platelets 150 - 400 K/uL 297   251   NEUT# 1.7 - 7.7 K/uL   9.7   Lymph# 0.7 - 4.0 K/uL   2.4      There is no height or weight on file to calculate BMI.  Orders:  No orders of the defined types were placed in this encounter.  Meds ordered this encounter  Medications   doxycycline (VIBRA-TABS) 100 MG tablet    Sig: Take 1 tablet (100 mg total) by mouth 2 (two) times daily.    Dispense:  20 tablet    Refill:  0     Procedures: No procedures performed  Clinical Data: No additional findings.  ROS:  All other systems negative, except  as noted in the HPI. Review of Systems  Objective: Vital Signs: There were no vitals taken for this visit.  Specialty Comments:  No specialty comments available.  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) 07/12/2023   Gangrene of toe of left foot (HCC) 06/24/2023   Type 2 diabetes mellitus with diabetic polyneuropathy, with long-term current use of insulin (HCC) 04/11/2023   Type 2 diabetes mellitus with hyperglycemia, with long-term current use of insulin (HCC) 04/11/2023   Diabetes mellitus (HCC) 04/11/2023   PTSD (post-traumatic stress disorder) 03/14/2023   Aortic valve sclerosis 09/16/2022   Hypoglycemia 08/27/2022   COPD GOLD 1/ AB 06/11/2022   Syncope and collapse 12/13/2021   Bifascicular block 12/13/2021   SIRS (systemic inflammatory response syndrome) (HCC) 12/13/2021   Renal insufficiency 12/13/2021   Esophageal motility disorder 12/13/2021   Heartburn    Scrotal abscess 11/19/2020    Severe sepsis (HCC) 09/03/2020   Non-adherence to medical treatment 05/27/2020   Barrett's esophagus 01/23/2019   Diarrhea 01/23/2019   MDD (major depressive disorder), severe (HCC) 12/17/2018   DKA, type 2 (HCC) 11/07/2018   High anion gap metabolic acidosis 03/22/2018   Epigastric abdominal pain 03/22/2018   Nausea vomiting and diarrhea 03/22/2018   AKI (acute kidney injury) (HCC) 03/22/2018   Uncontrolled type 2 diabetes mellitus with hyperglycemia (HCC) 03/22/2018   ARF (acute renal failure) (HCC) 05/30/2017   Mixed hyperlipidemia 03/31/2017   Diabetic polyneuropathy (HCC) 03/31/2017   Vitamin B12 deficiency 03/31/2017   Vitamin D deficiency 03/31/2017   Right bundle branch block 02/14/2017   DKA (diabetic ketoacidosis) (HCC) 10/15/2016   Gastroesophageal reflux disease 10/15/2016   Benign hypertension 10/15/2016   Leukocytosis 11/25/2015   Intractable nausea and vomiting 11/25/2015   Uncontrolled type 2 diabetes mellitus with hypoglycemia, with long-term current use of insulin (HCC) 11/25/2015   Dental abscess 11/25/2015   Nausea and vomiting 11/25/2015   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 03/2015 Cath: LM nl, LAD mild diff dzs throughout w/ 50p/d, LCX diff dzs, 53m, RCA diff dzs, 80m-->Med rx; b. 12/2020 MV: EF 57%, small/mild apical inferior defect w/ partial rev/mild ischemia.   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, bilateral    Sleep apnea 2016   does not use CPAP   Type 2 diabetes mellitus (HCC)     Family History  Problem Relation Age of Onset   Diabetes Mother    Hypertension Mother    Cancer Mother    Throat cancer Mother    Diabetes Father    Hypertension Father    Hyperlipidemia Father    Congestive Heart  Failure Sister    Colon cancer Neg Hx    Pancreatic cancer Neg Hx    Liver disease Neg Hx     Past Surgical History:  Procedure Laterality Date   ABDOMINAL AORTOGRAM W/LOWER EXTREMITY N/A 06/20/2023   Procedure: ABDOMINAL AORTOGRAM W/LOWER EXTREMITY;  Surgeon: Daria Pastures, MD;  [facility] INVASIVE CV LAB;  Service: Cardiovascular;  Laterality: N/A;   AMPUTATION Left 06/24/2023   Procedure: LEFT 5TH TOE AMPUTATION;  Surgeon: Nadara Mustard, MD;  Location: [facility] OR;  Service: Orthopedics;  Laterality: Left;   AMPUTATION Left 07/20/2023   Procedure: LEFT FOOT 4TH AND 5TH RAY AMPUTATION;  Surgeon: Nadara Mustard, MD;  Location: [name] OR;  Service: Orthopedics;  Laterality: Left;   ANKLE SURGERY Right 1982   "had extra bones in there; took them out"   APPENDECTOMY  1975   BIOPSY  12/26/2018   Procedure: BIOPSY;  Surgeon: Malissa Hippo, MD;  Location: [facility] ENDO SUITE;  Service: Endoscopy;;  duodenal biopsies   BIOPSY  03/23/2019   Procedure: BIOPSY;  Surgeon: Malissa Hippo, MD;  Location: [facility];  Service: Endoscopy;;  esophagus   CARDIAC CATHETERIZATION N/A 03/28/2015   Procedure: Left Heart Cath and Coronary Angiography;  Surgeon: Yates Decamp, MD;  Location: [facility] INVASIVE CV LAB;  Service: Cardiovascular;  Laterality: N/A;   CARDIAC CATHETERIZATION N/A 03/28/2015   Procedure: Intravascular Pressure Wire/FFR Study;  Surgeon: Yates Decamp, MD;  Location: [facility] INVASIVE CV LAB;  Service: Cardiovascular;  Laterality: N/A;   CARPAL TUNNEL  RELEASE Left ~ 2008   COLONOSCOPY WITH ESOPHAGOGASTRODUODENOSCOPY (EGD)     COLONOSCOPY WITH PROPOFOL N/A 12/24/2019   Procedure: COLONOSCOPY WITH PROPOFOL;  Surgeon: Malissa Hippo, MD;  Location: [facility];  Service: Endoscopy;  Laterality: N/A;  955   ELBOW FRACTURE SURGERY Left ~ 2008   ESOPHAGEAL MANOMETRY N/A 09/16/2021   Procedure: ESOPHAGEAL MANOMETRY (EM);  Surgeon: Shellia Cleverly, DO;  Location: [facility] ENDOSCOPY;  Service: Endoscopy;  Laterality: N/A;   ESOPHAGOGASTRODUODENOSCOPY N/A 12/26/2018   Procedure: ESOPHAGOGASTRODUODENOSCOPY (EGD);  Surgeon: Malissa Hippo, MD;  Location: [facility];  Service: Endoscopy;  Laterality: N/A;   ESOPHAGOGASTRODUODENOSCOPY (EGD) WITH PROPOFOL N/A 03/23/2019   Procedure: ESOPHAGOGASTRODUODENOSCOPY (EGD) WITH PROPOFOL;  Surgeon: Malissa Hippo, MD;  Location: [facility];  Service: Endoscopy;  Laterality: N/A;  7:30   FRACTURE SURGERY     ankle and elbow   PERIPHERAL VASCULAR BALLOON ANGIOPLASTY  06/20/2023   Procedure: PERIPHERAL VASCULAR BALLOON ANGIOPLASTY;  Surgeon: Daria Pastures, MD;  Location: [facility] INVASIVE CV LAB;  Service: Cardiovascular;;   PH IMPEDANCE STUDY N/A 09/16/2021   Procedure: PH IMPEDANCE STUDY;  Surgeon: Shellia Cleverly, DO;  Location: [facility] ENDOSCOPY;  Service: Endoscopy;  Laterality: N/A;   PILONIDAL CYST EXCISION N/A 03/24/2017   Procedure: EXCISION CHRONIC  PILONIDAL ABSCESS;  Surgeon: [name] Miyamoto, MD;  Location: [facility] ORS;  Service: General;  Laterality: N/A;   SCROTAL EXPLORATION N/A 11/13/2020   Procedure: EXCISION OF SEBACEOUS CYSTS, SCROTUM;  Surgeon: Malen Gauze, MD;  Location: [facility] ORS;  Service: Urology;  Laterality: N/A;   TENDON REPAIR Left ~ 2004   "main tendon in my ankle"   Social History   Occupational History   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  Tobacco Use   Smoking status: Every Day    Current packs/day: 1.00    Average packs/day: 1 pack/day for 34.0 years (34.0 ttl pk-yrs)     Types: Cigarettes  Passive exposure: Never   Smokeless tobacco: Never   Tobacco comments:    1/2 ppd -1 ppd - patient states "trying to cut back" as of 06/18/23 km  Vaping Use   Vaping status: Never Used  Substance and Sexual Activity   Alcohol use: Never    Alcohol/week: 0.0 standard drinks of alcohol   Drug use: Never   Sexual activity: Not Currently

## 2023-08-11 ENCOUNTER — Other Ambulatory Visit: Payer: Self-pay | Admitting: *Deleted

## 2023-08-11 DIAGNOSIS — I70222 Atherosclerosis of native arteries of extremities with rest pain, left leg: Secondary | ICD-10-CM

## 2023-08-16 ENCOUNTER — Encounter: Payer: MEDICAID | Admitting: Family

## 2023-08-16 ENCOUNTER — Telehealth: Payer: Self-pay | Admitting: Family

## 2023-08-16 ENCOUNTER — Other Ambulatory Visit: Payer: Self-pay | Admitting: Orthopedic Surgery

## 2023-08-16 MED ORDER — HYDROCODONE-ACETAMINOPHEN 5-325 MG PO TABS: 1.0000 | ORAL_TABLET | Freq: Four times a day (QID) | ORAL | 0 refills | Status: DC | PRN

## 2023-08-16 NOTE — Telephone Encounter (Signed)
 Patient called would like a refill on hydrocodone. [phone]

## 2023-08-17 NOTE — Telephone Encounter (Signed)
 Pt informed

## 2023-08-22 ENCOUNTER — Encounter: Payer: Self-pay | Admitting: Neurology

## 2023-08-22 ENCOUNTER — Ambulatory Visit: Payer: MEDICAID | Admitting: Neurology

## 2023-08-22 VITALS — BP 100/68 | HR 100 | Ht 68.0 in | Wt 149.8 lb

## 2023-08-22 DIAGNOSIS — I951 Orthostatic hypotension: Secondary | ICD-10-CM

## 2023-08-22 DIAGNOSIS — Z9181 History of falling: Secondary | ICD-10-CM | POA: Diagnosis not present

## 2023-08-22 DIAGNOSIS — R2689 Other abnormalities of gait and mobility: Secondary | ICD-10-CM | POA: Diagnosis not present

## 2023-08-22 DIAGNOSIS — R42 Dizziness and giddiness: Secondary | ICD-10-CM

## 2023-08-22 NOTE — Progress Notes (Signed)
 Subjective:    Patient ID: [name] [name] is a 53 y.o. male.  HPI    [name], MD, PhD [facility] [address] [address] [place] [facility] [place], [place] [address]  Dear Dr. Signe Colt,   I saw your patient, [name] [name], upon your kind request in my neurologic clinic today for initial consultation of his dizziness and orthostatic hypotension.  The patient is unaccompanied today. 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  He feels off balance.  He has fallen.  He typically uses a cane.  He has his left foot in a boot.  Of note, he is on multiple medications which can cause sedation and balance issues including Abilify, Xanax, and hydrocodone currently.  He reports that he no longer takes gabapentin or trazodone.  He does not sleep well.  He has lost quite a bit of weight over the past few years, in the realm of nearly 50 pounds.  He has a history of obstructive sleep apnea but is not on PAP therapy.  He report orts tightness in his chest and difficulty breathing at times.  He has not seen his pulmonologist.  He reports intermittent blurry vision and a problem with the blood vessels in his left eye but has not seen his eye doctor in over 2 years.  He has gone to [employer] in [place].  Of note, his initial triage blood pressure was highly elevated.  He did have an over 60 point  drop in his systolic blood pressure and over 30 point drop in his diastolic blood pressure from lying to standing BP check. He also had associated symptoms of lightheadedness.  He is on ProAmatine 3 times a day.   I reviewed your office note from 02/16/2023.  He reported feeling lightheaded at the time.  Of note, he is followed by multiple specialties including pulmonology, orthopedics, cardiology, behavioral health, hematology, and vascular surgery.  He drinks about 2 to 3 L of water per day.  No alcohol.  He reports smoking slightly less than a pack per day, he has been smoking for over 41 years.  He does not drink caffeine daily.  I had evaluated him for dizziness and concern for neurogenic claudication several years ago.  He was also evaluated several years ago for OSA concern.  He had a brain MRI wo contrast on 01/05/2017 and I reviewed the results: Impression: Normal brain MRI (without).   In addition, I personally and independently reviewed images through the PACS system. Previously (copied from previous notes for reference):    12/28/2016: 53 year old right-handed gentleman with an underlying medical history of type 2 diabetes with suboptimal control (A1c 10.9 in May 2018), hypertension, reflux disease, smoking, hyperlipidemia, depression, history of chest pain, low back pain, recent admission to the hospital in May 2018 for diabetic ketoacidosis and overweight state, who reports intermittent dizziness and reports that this is the reason why he was referred but is not completely sure. He feels, that dizzy spells when  suddenly changing position, like bending to standing or turning. He had more spells after his D/C and also fell in the kitchen, hit his head, believes he lost consciousness for some seconds, hit the R side of his face. His BP may have been low.  He has intermittent muffled hearing both sides and tinnitus on the right.Has not seen ENT for this. Drinks soda and tea, regular, not  diet. Lives with parents. Smokes 1/2 ppd, no EtOH. He endorses intermittent low back pain. He has not seen a spine specialist for this. He tries to drink enough water. He does endorse numbness in his legs.      I have seen him previously once before on 09/03/2014 for sleep apnea evaluation, at which time he was referred by his cardiologist, Dr. Jacinto Halim. He had sleep study testing at the time including a baseline sleep study on 09/10/2014 which showed mild obstructive sleep apnea, severe during REM sleep, moderate during supine sleep. He then return for a CPAP titration study on 09/30/2014 which showed good results with CPAP of 7 cm. He declined CPAP therapy and also did not return for a follow-up appointment at the time.  He had a lumbar spine MRI without contrast on 09/02/2016, this was done at [facility], which I reviewed: IMPRESSION: Tiny focus of stress marrow edema in the left superior endplate of L4. No disc herniation. Otherwise normal MRI of the lumbar spine.     09/03/2014: [name] [name] is a 53 year old right-handed gentleman with an underlying medical history of hypertension, smoking, hyperlipidemia, poorly controlled type 2 diabetes, depression and atypical chest pain, as well as overweight state, who reports snoring and excessive daytime somnolence. She had a pulse oximetry test overnight on 07/11/2014 which I reviewed: Average oxygen saturation was 92.5%, lowest was 81%, time below 88% saturation was 6.1 minutes. Time below 89% saturation was 15.4 minutes. Total test time was 10 hours and 43 minutes.  I reviewed his recent blood work and test results from your office: on 07/05/14: LDL was elevated at 150, total cholesterol was 220.  He had a Lexiscan stress test on 07/12/14, which I reviewed: NSR, RBBB, negative for ischemia.  SPECT imaging showed normal EF. HbA1c on 06/05/14: 14.3%.  Echocardiogram on 07/09/14: LV normal in size, mild concentric LVH, normal global wall motion. Calculated EF:  58%. Trace MR, trace TR.    His typical bedtime is reported to be around 10 PM and usual wake time is around 5:30 AM. Sleep onset typically occurs within 30-40 minutes. He reports feeling poorly rested upon awakening. He wakes up on an average 3 to 4 times in the middle of the night and has to go to the bathroom 1 times on a typical night. He reports occasional morning headaches.  He reports excessive daytime somnolence (EDS) and His Epworth Sleepiness Score (ESS) is 5/24 today. He has not fallen asleep while driving. The patient has not been taking a planned nap.  He has been known to snore for the past few years. Snoring is reportedly mild to moderate, and it is unclear if it is associated with choking sounds or witnessed apneas. He is single, lives with his mother and sleeps alone, except for the dog in the bed. The patient admits to a sense of choking or strangling feeling occasionally. There some report of nighttime reflux, with no nighttime cough experienced. The patient has noted some RLS symptoms and is a restless sleeper, admits to tossing and turning, having nightmares and occasional sleep walking  and sleep talking. There is no family history of RLS but sister had OSA on CPAP and died at age 35 from CHF.  He denies cataplexy, sleep paralysis, hypnagogic or hypnopompic hallucinations, or sleep attacks. He does report vivid dreams, nightmares, no dream enactments, but has occasional sleep talking and sleep walking. The patient has not had a sleep study or a home sleep test.  He consumes 4 caffeinated beverages per day, usually in the form of soda: [facility].   His bedroom is usually dark and cool. There is a TV in the bedroom and usually it is not on at night.      His Past Medical History Is Significant For: Past Medical History:  Diagnosis Date   Anxiety    Arthritis    Asthma    CAD (coronary artery disease)    a. 03/2015 Cath: LM nl, LAD mild diff dzs throughout w/ 50p/d, LCX diff  dzs, 76m, RCA diff dzs, 25m-->Med rx; b. 12/2020 MV: EF 57%, small/mild apical inferior defect w/ partial rev/mild ischemia.   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, bilateral    Sleep apnea 2016   does not use CPAP   Type 2 diabetes mellitus (HCC)     His Past Surgical History Is Significant For: Past Surgical History:  Procedure Laterality Date   ABDOMINAL AORTOGRAM W/LOWER EXTREMITY N/A 06/20/2023   Procedure: ABDOMINAL AORTOGRAM W/LOWER EXTREMITY;  Surgeon: Daria Pastures, MD;  Location: [facility] INVASIVE CV LAB;  Service: Cardiovascular;  Laterality: N/A;   AMPUTATION Left 06/24/2023   Procedure: LEFT 5TH TOE AMPUTATION;  Surgeon: Nadara Mustard, MD;  Location: [facility] OR;  Service: Orthopedics;  Laterality: Left;   AMPUTATION Left 07/20/2023   Procedure: LEFT FOOT 4TH AND 5TH RAY AMPUTATION;  Surgeon: Nadara Mustard, MD;  Location: [facility] OR;  Service: Orthopedics;  Laterality: Left;   ANKLE SURGERY Right 1982   "had extra bones in there; took them out"   APPENDECTOMY  1975   BIOPSY  12/26/2018   Procedure: BIOPSY;  Surgeon: Malissa Hippo, MD;  Location: [facility];  Service: Endoscopy;;  duodenal biopsies   BIOPSY  03/23/2019   Procedure: BIOPSY;  Surgeon: Malissa Hippo, MD;  Location: [facility];  Service: Endoscopy;;  esophagus   CARDIAC CATHETERIZATION N/A 03/28/2015   Procedure: Left Heart Cath and Coronary Angiography;  Surgeon: Yates Decamp, MD;  Location: [facility] INVASIVE CV LAB;  Service: Cardiovascular;  Laterality: N/A;   CARDIAC CATHETERIZATION N/A 03/28/2015   Procedure: Intravascular Pressure Wire/FFR Study;  Surgeon: Yates Decamp,  MD;  Location: [facility] INVASIVE CV LAB;  Service: Cardiovascular;  Laterality: N/A;   CARPAL TUNNEL RELEASE Left ~ 2008   COLONOSCOPY WITH ESOPHAGOGASTRODUODENOSCOPY (EGD)     COLONOSCOPY WITH PROPOFOL N/A 12/24/2019   Procedure: COLONOSCOPY WITH PROPOFOL;  Surgeon: Malissa Hippo, MD;  Location: [facility];  Service: Endoscopy;  Laterality: N/A;  955   ELBOW FRACTURE SURGERY Left ~ 2008   ESOPHAGEAL MANOMETRY N/A 09/16/2021   Procedure: ESOPHAGEAL MANOMETRY (EM);  Surgeon: Doristine Locks  V, DO;  Location: [facility]OS[facility];  Service: Endoscopy;  Laterality: N/A;   ESOPHAGOGASTRODUODENOSCOPY N/A 12/26/2018   Procedure: ESOPHAGOGASTRODUODENOSCOPY (EGD);  Surgeon: Malissa Hippo, MD;  [facility];  Service: Endoscopy;  Laterality: N/A;   ESOPHAGOGASTRODUODENOSCOPY (EGD) WITH PROPOFOL N/A 03/23/2019   Procedure: ESOPHAGOGASTRODUODENOSCOPY (EGD) WITH PROPOFOL;  Surgeon: Malissa Hippo, MD;  [facility];  Service: Endoscopy;  Laterality: N/A;  7:30   FRACTURE SURGERY     ankle and elbow   PERIPHERAL VASCULAR BALLOON ANGIOPLASTY  06/20/2023   Procedure: PERIPHERAL VASCULAR BALLOON ANGIOPLASTY;  Surgeon: Daria Pastures, MD;  Location: [facility];  Service: Cardiovascular;;   PH IMPEDANCE STUDY N/A 09/16/2021   Procedure: PH IMPEDANCE STUDY;  Surgeon: Shellia Cleverly, DO;  Location: [facility]OS[facility];  Service: Endoscopy;  Laterality: N/A;   PILONIDAL CYST EXCISION N/A 03/24/2017   Procedure: EXCISION CHRONIC  PILONIDAL ABSCESS;  Surgeon: [name] Miyamoto, MD;  Location: [facility] ORS;  Service: General;  Laterality: N/A;   SCROTAL EXPLORATION N/A 11/13/2020   Procedure: EXCISION OF SEBACEOUS CYSTS, SCROTUM;  Surgeon: Malen Gauze, MD;  Location: [facility] OR[facility];  Service: Urology;  Laterality: N/A;   TENDON REPAIR Left ~ 2004   "main tendon in my ankle"    His Family History Is Significant For: Family History  Problem Relation Age of Onset   Diabetes Mother    Hypertension  Mother    Cancer Mother    Throat cancer Mother    Diabetes Father    Hypertension Father    Hyperlipidemia Father    Congestive Heart Failure Sister    Colon cancer Neg Hx    Pancreatic cancer Neg Hx    Liver disease Neg Hx     His Social History Is Significant For: Social History   Socioeconomic History   Marital status: Single    Spouse name: Not on file   Number of children: 0   Years of education: 10th g   Highest education level: Not on file  Occupational History   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  Other Topics Concern   Not on file  Social History Narrative   His main caretaker, his mother has terminal metastatic cancer.   Social Drivers of Health   Financial Resource Strain: High Risk (06/24/2022)   Overall Financial Resource Strain (CARDIA)    Difficulty of Paying Living Expenses: Very hard  Food Insecurity: Food Insecurity Present (06/18/2022)   Hunger Vital Sign    Worried About Running Out of Food in the Last Year: Often true    Ran Out of Food in the Last Year: Often true  Transportation Needs: No Transportation Needs (06/18/2022)   PRAPARE - Administrator, Civil Service (Medical): No    Lack of Transportation (Non-Medical): No  Physical Activity: Not on file  Stress: Not on file  Social Connections:  Unknown (05/25/2022)   Received from [facility]   Social Network    Social Network: Not on file    His Allergies Are:  Allergies   Allergen Reactions   Prilosec Otc [Omeprazole Magnesium] Swelling    Face swells, no breathing impairment   Bee Venom Swelling   Morphine     Itching at IV site,  N+V   Nexium [Esomeprazole] Swelling    Face swells, no breathing impairment  :   His Current Medications Are:  Outpatient Encounter Medications as of 08/22/2023  Medication Sig   Accu-Chek FastClix Lancets MISC Check blood sugar 3 times daily   acetaminophen (TYLENOL) 500 MG tablet Take 1,000 mg by mouth 2 (two) times daily as needed for moderate pain, fever or headache.   albuterol (VENTOLIN HFA) 108 (90 Base) MCG/ACT inhaler Inhale 2 puffs into the lungs every 6 (six) hours as needed for wheezing or shortness of breath.   ALPRAZolam (XANAX) 1 MG tablet Take 1 mg by mouth at bedtime as needed for anxiety or sleep.   amLODipine (NORVASC) 5 MG tablet Take 5 mg by mouth every evening.   ARIPiprazole (ABILIFY) 10 MG tablet Take 10 mg by mouth in the morning.   aspirin EC 81 MG tablet Take 81 mg by mouth daily with breakfast.   atorvastatin (LIPITOR) 80 MG tablet TAKE 1 Tablet BY MOUTH ONCE EVERY DAY (Patient taking differently: Take 80 mg by mouth in the morning.)   Blood Glucose Monitoring Suppl (ACCU-CHEK GUIDE) w/Device KIT Use to check blood sugar 3 times daily   budesonide-formoterol (SYMBICORT) 80-4.5 MCG/ACT inhaler Take 2 puffs first thing in am and then another 2 puffs about 12 hours later. (Patient taking differently: Inhale 2 puffs into the lungs 2 (two) times daily as needed (wheeze, shortness of breath).)   clopidogrel (PLAVIX) 75 MG tablet Take 1 tablet (75 mg total) by mouth daily.   doxycycline (VIBRA-TABS) 100 MG tablet Take 1 tablet (100 mg total) by mouth 2 (two) times daily.   ezetimibe (ZETIA) 10 MG tablet Take 1 tablet (10 mg total) by mouth daily. (Patient taking differently: Take 10 mg by mouth every evening.)   famotidine (PEPCID) 40 MG tablet Take 1 tablet (40 mg total) by mouth 2 (two) times daily.  (Patient taking differently: Take 40 mg by mouth 2 (two) times daily as needed for indigestion or heartburn.)   gabapentin (NEURONTIN) 300 MG capsule Take 1 capsule (300 mg total) by mouth every 8 (eight) hours as needed.   glucose blood (ACCU-CHEK GUIDE) test strip Check blood sugar 3 times daily   glucose blood (TRUE METRIX BLOOD GLUCOSE TEST) test strip Check blood sugar 3 times daily   HYDROcodone-acetaminophen (NORCO/VICODIN) 5-325 MG tablet Take 1 tablet by mouth every 6 (six) hours as needed.   insulin aspart (NOVOLOG FLEXPEN) 100 UNIT/ML FlexPen Start CF: NovoLog (BG-130/45) TIDQAC  Max daily 30 units (Patient taking differently: Inject 0-7 Units into the skin 3 (three) times daily before meals. Start CF: NovoLog (BG-130/45) TIDQAC  Max daily 30 units)   insulin glargine (LANTUS SOLOSTAR) 100 UNIT/ML Solostar Pen Inject 14 Units into the skin daily.   Insulin Pen Needle 32G X 4 MM MISC 1 Device by Does not apply route in the morning, at noon, in the evening, and at bedtime.   linaclotide (LINZESS) 145 MCG CAPS capsule Take 1 capsule (145 mcg total) by mouth daily before breakfast.   metFORMIN (GLUCOPHAGE-XR) 500 MG 24 hr tablet Take 1  tablet (500 mg total) by mouth 2 (two) times daily with a meal.   midodrine (PROAMATINE) 10 MG tablet Take 1 tablet (10 mg total) by mouth 3 (three) times daily.   mupirocin ointment (BACTROBAN) 2 % Apply topically 2 (two) times daily.   nitroGLYCERIN (NITROSTAT) 0.4 MG SL tablet Place 1 tablet (0.4 mg total) under the tongue every 5 (five) minutes as needed for chest pain.   polyethylene glycol (MIRALAX / GLYCOLAX) 17 g packet Take 17 g by mouth daily.   prochlorperazine (COMPAZINE) 10 MG tablet Take 1 tablet (10 mg total) by mouth every 6 (six) hours as needed for nausea or vomiting.   promethazine (PHENERGAN) 25 MG tablet Take 25 mg by mouth daily as needed for nausea or vomiting.   sertraline (ZOLOFT) 100 MG tablet Take 2 tablets (200 mg total) by mouth at  bedtime.   sulfamethoxazole-trimethoprim (BACTRIM DS) 800-160 MG tablet Take 1 tablet by mouth 2 (two) times daily.   traZODone (DESYREL) 50 MG tablet Take 50 mg by mouth at bedtime.   Continuous Glucose Sensor (FREESTYLE LIBRE 2 PLUS SENSOR) MISC 1 Device by Does not apply route every 14 (fourteen) days. (Patient not taking: Reported on 08/22/2023)   Continuous Glucose Sensor (FREESTYLE LIBRE 2 SENSOR) MISC Change sensors every 14 days (Patient not taking: Reported on 08/22/2023)   Continuous Glucose Sensor (FREESTYLE LIBRE 3 PLUS SENSOR) MISC 1 Device by Other route every 14 (fourteen) days. Change sensor every 15 days. (Patient not taking: Reported on 08/22/2023)   Insulin Disposable Pump (OMNIPOD 5 LIBRE2 PLUS G6 PODS) MISC 1 DEVICE BY DOES NOT APPLY ROUTE EVERY 3 (THREE) DAYS. (Patient not taking: Reported on 08/22/2023)   Insulin Disposable Pump (OMNIPOD 5 LIBRE2 PLUS G6) KIT 1 Device by Does not apply route every 3 (three) days. (Patient not taking: Reported on 08/22/2023)   No facility-administered encounter medications on file as of 08/22/2023.  :   Review of Systems:  Out of a complete 14 point review of systems, all are reviewed and negative with the exception of these symptoms as listed below:  Review of Systems  Neurological:        Pt is here Alone. Pt states that when he would stand up he would get light headed. Pt states that when he would walk to the kitchen he would pass out. Pt states that he feels like someone is pressing on his neck.  Pt states that his dizziness last 15-46mins.     Objective:  Neurological Exam  Physical Exam Physical Examination:   Vitals:   08/22/23 0803 08/22/23 0811  BP: (!) 161/105 100/68  Pulse: 95 100    General Examination: The patient is a very pleasant 53 y.o. male in no acute distress. He appears chronically ill and deconditioned.  He is adequately groomed.  HEENT: Normocephalic, atraumatic, pupils are equal, round and reactive to light,  extraocular tracking is good without limitation to gaze excursion or nystagmus noted.  Corrective eyeglasses in place, bifocals.  Funduscopic exam is difficult.  Hearing is grossly intact. Face is symmetric with normal facial animation. Speech is clear with no dysarthria noted. There is no hypophonia. There is no lip, neck/head, jaw or voice tremor. Neck is supple with full range of passive and active motion. There are no carotid bruits on auscultation. Oropharynx exam reveals: moderate mouth dryness, edentulous state, moderate airway crowding.  Tongue protrudes centrally and palate elevates symmetrically.   Chest: Clear to auscultation without wheezing, rhonchi or crackles noted.  Heart: S1+S2+0, regular and normal without murmurs, rubs or gallops noted.   Abdomen: Soft, non-tender and non-distended.  Extremities: There is no pitting edema in the distal lower extremities bilaterally.  Left foot bandaged and in a boot.   Skin: Warm and dry without trophic changes noted.   Musculoskeletal: exam reveals no obvious joint deformities.   Neurologically:  Mental status: The patient is awake, alert and oriented in all 4 spheres. His immediate and remote memory, attention, language skills and fund of knowledge are appropriate. There is no evidence of aphasia, agnosia, apraxia or anomia. Speech is clear with normal prosody and enunciation. Thought process is linear. Mood is normal and affect is blunted.  Cranial nerves II - XII are as described above under HEENT exam.  Motor exam: Thin bulk, global strength of 4+ out of 5, no lateralization.  Left foot not tested due to bandage and boot.   Reflexes trace in the upper extremities and the left knee, absent in the right knee and both ankles.   No obvious action or resting tremor.  No postural tremor.  Fine motor skills and coordination: grossly intact.  Cerebellar testing: No dysmetria or intention tremor. There is no truncal or gait ataxia.  Sensory exam:  intact to light touch in the upper and lower extremities.  Gait, station and balance: He stands with difficulty.  Posture is age-appropriate.  He walks slowly with a limp due to the boot, no walking aid.    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  Examination shows no focal neurologic finding.  He has significant orthostatic hypotension with a 60+ point drop in the systolic and 38+ point drop in the diastolic blood pressure.  I had a long discussion with the patient regarding blood pressure fluctuation, dizziness and balance issues.  He is probably having symptoms secondary to more than 1 factor including from medication effects which can cause dizziness and balance issues including his Abilify, Xanax and hydrocodone currently.  He reports that he will be stopping the hydrocodone most likely.  He is no longer taking gabapentin and trazodone.   Below is a summary of my recommendations and our discussion points based on today's visit.  This was an extended visit of over 60 minutes due to copious record review involved including paper chart review and electronic record review.  <<Unfortunately, dizziness is a very common complaint but is often not due to a primary neurological reason or single underlying medical problem. Often, there a combination of factors, that result in  dizziness. This includes blood pressure fluctuations, poorly controlled diabetes, medication side effects (from Abilify, Xanax and hydrocodone, prior to that also the gabapentin and trazodone), blood sugar fluctuations, stress, vertigo, poor sleep with sleep deprivation, dehydration, and electrolyte disturbance or other metabolic and endocrinological reasons, meaning hormone related problems such as thyroid dysfunction. We will investigate things further with a brain MRI. We will call you with the test results. Please make an appointment with  your eye doctor.  Please make an appointment with your lung doctor.  Avoid any caffeine or any sugary drinks.  Follow up with your other providers. >>  Thank you very much for allowing me to participate in the care of this nice patient. If I can be of any further assistance to you please do not hesitate to call me at [phone].  Sincerely,   [name], MD, PhD

## 2023-08-22 NOTE — Patient Instructions (Signed)
 Unfortunately, dizziness is a very common complaint but is often not due to a primary neurological reason or single underlying medical problem. Often, there a combination of factors, that result in dizziness. This includes blood pressure fluctuations, poorly controlled diabetes, medication side effects (from Abilify, Xanax and hydrocodone, prior to that also the gabapentin and trazodone), blood sugar fluctuations, stress, vertigo, poor sleep with sleep deprivation, dehydration, and electrolyte disturbance or other metabolic and endocrinological reasons, meaning hormone related problems such as thyroid dysfunction. We will investigate things further with a brain MRI. We will call you with the test results. Please make an appointment with your eye doctor.  Please make an appointment with your lung doctor.  Avoid any caffeine or any sugary drinks.  Follow up with your other providers.

## 2023-08-24 ENCOUNTER — Ambulatory Visit (INDEPENDENT_AMBULATORY_CARE_PROVIDER_SITE_OTHER): Payer: MEDICAID | Admitting: Family

## 2023-08-24 DIAGNOSIS — Z89422 Acquired absence of other left toe(s): Secondary | ICD-10-CM

## 2023-08-24 DIAGNOSIS — S98132A Complete traumatic amputation of one left lesser toe, initial encounter: Secondary | ICD-10-CM

## 2023-08-24 MED ORDER — NITROGLYCERIN 0.2 MG/HR TD PT24: 0.2000 mg | MEDICATED_PATCH | Freq: Every day | TRANSDERMAL | 12 refills | Status: DC

## 2023-08-24 NOTE — Progress Notes (Signed)
 Post-Op Visit Note   Patient: [name] [name]           Date of Birth: 1970/09/12           MRN: [id number] Visit Date: 08/24/2023 PCP: Patient, No Pcp Per  Chief Complaint:  Chief Complaint  Patient presents with   Left Foot - Follow-up, Routine Post Op     06/24/23 left 5th toe amputation 07/20/23 left 4th and 5th ray amputatation      HPI:  HPI Patient is a 53 year old gentleman seen status post fourth ray amputation February 19 on the left foot he continues to have some shooting pains he is full weightbearing with a crutch and a postop shoe on the left.  He completed his doxycycline a couple days ago he feels he noticed improvement in the wound while taking this course.  He continues to have some clear drainage 2 sutures are in place Ortho Exam On examination left foot there is an area of dehiscence distally this is 3 cm x3 mm in width 2 mm in depth there is no active drainage no surrounding erythema  Visit Diagnoses: No diagnosis found.  Plan: Pack open with with silver cell.  Minimize weightbearing.  Will start nitroglycerin patches Follow-Up Instructions: No follow-ups on file.   Imaging: No results found.  Orders:  No orders of the defined types were placed in this encounter.  Meds ordered this encounter  Medications   nitroGLYCERIN (NITRODUR - DOSED IN MG/24 HR) 0.2 mg/hr patch    Sig: Place 1 patch (0.2 mg total) onto the skin daily.    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) 07/12/2023   Gangrene of toe of left foot (HCC) 06/24/2023   Type 2 diabetes mellitus with diabetic polyneuropathy, with long-term current use of insulin (HCC) 04/11/2023   Type 2 diabetes mellitus with hyperglycemia, with long-term current use of insulin (HCC) 04/11/2023   Diabetes mellitus (HCC) 04/11/2023   PTSD (post-traumatic stress disorder) 03/14/2023   Aortic valve  sclerosis 09/16/2022   Hypoglycemia 08/27/2022   COPD GOLD 1/ AB 06/11/2022   Syncope and collapse 12/13/2021   Bifascicular block 12/13/2021   SIRS (systemic inflammatory response syndrome) (HCC) 12/13/2021   Renal insufficiency 12/13/2021   Esophageal motility disorder 12/13/2021   Heartburn    Scrotal abscess 11/19/2020   Severe sepsis (HCC) 09/03/2020   Non-adherence to medical treatment 05/27/2020   Barrett's esophagus 01/23/2019   Diarrhea 01/23/2019   MDD (major depressive disorder), severe (HCC) 12/17/2018   DKA, type 2 (HCC) 11/07/2018   High anion gap metabolic acidosis 03/22/2018   Epigastric abdominal pain 03/22/2018   Nausea vomiting and diarrhea 03/22/2018   AKI (acute kidney injury) (HCC) 03/22/2018   Uncontrolled type 2 diabetes mellitus with hyperglycemia (HCC) 03/22/2018   ARF (acute renal failure) (HCC) 05/30/2017   Mixed hyperlipidemia 03/31/2017   Diabetic polyneuropathy (HCC) 03/31/2017   Vitamin B12 deficiency 03/31/2017   Vitamin D deficiency 03/31/2017   Right bundle branch block 02/14/2017   DKA (diabetic ketoacidosis) (HCC) 10/15/2016   Gastroesophageal reflux disease 10/15/2016   Benign hypertension 10/15/2016   Leukocytosis 11/25/2015   Intractable nausea and vomiting 11/25/2015   Uncontrolled type 2 diabetes mellitus with hypoglycemia, with long-term current use of insulin (HCC) 11/25/2015   Dental abscess 11/25/2015   Nausea and vomiting 11/25/2015  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 03/2015 Cath: LM nl, LAD mild diff dzs throughout w/ 50p/d, LCX diff dzs, 42m, RCA diff dzs, 46m-->Med rx; b. 12/2020  MV: EF 57%, small/mild apical inferior defect w/ partial rev/mild ischemia.   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, bilateral    Sleep apnea 2016   does not use CPAP   Type 2 diabetes mellitus (HCC)     Family History  Problem Relation Age of Onset   Diabetes Mother    Hypertension Mother    Cancer Mother    Throat cancer Mother    Diabetes Father    Hypertension Father    Hyperlipidemia Father    Congestive Heart Failure Sister    Colon cancer Neg Hx    Pancreatic cancer Neg Hx    Liver disease Neg Hx     Past Surgical History:  Procedure Laterality Date   ABDOMINAL AORTOGRAM W/LOWER EXTREMITY N/A 06/20/2023   Procedure: ABDOMINAL AORTOGRAM W/LOWER EXTREMITY;  Surgeon: Daria Pastures, MD;  Location: [facility];  Service: Cardiovascular;  Laterality: N/A;   AMPUTATION Left 06/24/2023   Procedure: LEFT 5TH TOE AMPUTATION;  Surgeon: Nadara Mustard, MD;  Location: [facility] OR;  Service: Orthopedics;  Laterality: Left;   AMPUTATION Left 07/20/2023   Procedure: LEFT FOOT 4TH AND 5TH RAY AMPUTATION;  Surgeon: Nadara Mustard, MD;  Location: [facility] OR;  Service: Orthopedics;  Laterality: Left;   ANKLE SURGERY Right 1982   "had extra bones in there; took them out"   APPENDECTOMY  1975   BIOPSY  12/26/2018   Procedure: BIOPSY;  Surgeon: Malissa Hippo, MD;  Location: [facility];  Service: Endoscopy;;  duodenal biopsies   BIOPSY  03/23/2019   Procedure: BIOPSY;  Surgeon: Malissa Hippo, MD;  Location: [facility];  Service: Endoscopy;;  esophagus   CARDIAC CATHETERIZATION N/A 03/28/2015   Procedure: Left Heart Cath and Coronary  Angiography;  Surgeon: Yates Decamp, MD;  [facility] INVASIVE CV LAB;  Service: Cardiovascular;  Laterality: N/A;   CARDIAC CATHETERIZATION N/A 03/28/2015   Procedure: Intravascular Pressure Wire/FFR Study;  Surgeon: Yates Decamp, MD;  Location: [facility] INVASIVE CV LAB;  Service: Cardiovascular;  Laterality: N/A;   CARPAL TUNNEL RELEASE Left ~ 2008   COLONOSCOPY WITH ESOPHAGOGASTRODUODENOSCOPY (EGD)     COLONOSCOPY WITH PROPOFOL N/A 12/24/2019   Procedure: COLONOSCOPY WITH PROPOFOL;  Surgeon: Malissa Hippo, MD;  [facility]: [facility];  Service: Endoscopy;  Laterality: N/A;  955   ELBOW FRACTURE SURGERY Left ~ 2008   ESOPHAGEAL MANOMETRY N/A 09/16/2021   Procedure: ESOPHAGEAL MANOMETRY (EM);  Surgeon: Shellia Cleverly, DO;  Location: [facility];  Service: Endoscopy;  Laterality: N/A;   ESOPHAGOGASTRODUODENOSCOPY N/A 12/26/2018   Procedure: ESOPHAGOGASTRODUODENOSCOPY (EGD);  Surgeon: Malissa Hippo, MD;  [facility];  Service: Endoscopy;  Laterality: N/A;   ESOPHAGOGASTRODUODENOSCOPY (EGD) WITH PROPOFOL N/A 03/23/2019   Procedure: ESOPHAGOGASTRODUODENOSCOPY (EGD) WITH PROPOFOL;  Surgeon: Malissa Hippo, MD;  [facility];  Service: Endoscopy;  Laterality: N/A;  7:30   FRACTURE SURGERY     ankle and elbow   PERIPHERAL VASCULAR BALLOON ANGIOPLASTY  06/20/2023   Procedure: PERIPHERAL VASCULAR BALLOON ANGIOPLASTY;  Surgeon: Daria Pastures, MD;  Location: [facility];  Service: Cardiovascular;;   PH IMPEDANCE STUDY N/A 09/16/2021   Procedure: PH IMPEDANCE STUDY;  Surgeon: Shellia Cleverly, DO;  Location: [facility];  Service: Endoscopy;  Laterality: N/A;   PILONIDAL CYST EXCISION N/A 03/24/2017   Procedure: EXCISION CHRONIC  PILONIDAL ABSCESS;  Surgeon: [name] Miyamoto, MD;  Location: [facility] ORS;  Service: General;  Laterality: N/A;   SCROTAL EXPLORATION N/A 11/13/2020   Procedure: EXCISION OF SEBACEOUS CYSTS, SCROTUM;  Surgeon: Malen Gauze, MD;  Location: [facility];  Service: Urology;  Laterality: N/A;   TENDON REPAIR Left ~ 2004   "main tendon in my ankle"   Social History   Occupational History   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

## 2023-08-29 ENCOUNTER — Telehealth: Payer: Self-pay | Admitting: Neurology

## 2023-08-29 NOTE — Telephone Encounter (Signed)
 [name] [name]: [id number] exp. 09/28/2023 sent to GI [phone]

## 2023-09-06 ENCOUNTER — Ambulatory Visit
Admission: RE | Admit: 2023-09-06 | Discharge: 2023-09-06 | Disposition: A | Discharge status: Home / Self Care | Payer: MEDICAID | Source: Ambulatory Visit | Attending: Neurology | Admitting: Neurology

## 2023-09-06 ENCOUNTER — Other Ambulatory Visit: Payer: Self-pay | Admitting: Family

## 2023-09-06 ENCOUNTER — Ambulatory Visit: Payer: MEDICAID | Admitting: Family

## 2023-09-06 ENCOUNTER — Encounter: Payer: Self-pay | Admitting: Neurology

## 2023-09-06 DIAGNOSIS — Z9181 History of falling: Secondary | ICD-10-CM

## 2023-09-06 DIAGNOSIS — R42 Dizziness and giddiness: Secondary | ICD-10-CM | POA: Diagnosis not present

## 2023-09-06 DIAGNOSIS — R2689 Other abnormalities of gait and mobility: Secondary | ICD-10-CM

## 2023-09-06 DIAGNOSIS — I951 Orthostatic hypotension: Secondary | ICD-10-CM

## 2023-09-06 DIAGNOSIS — Z89422 Acquired absence of other left toe(s): Secondary | ICD-10-CM

## 2023-09-06 DIAGNOSIS — S98132A Complete traumatic amputation of one left lesser toe, initial encounter: Secondary | ICD-10-CM

## 2023-09-06 MED ORDER — DOXYCYCLINE HYCLATE 100 MG PO TABS
100.0000 mg | ORAL_TABLET | Freq: Two times a day (BID) | ORAL | 0 refills | Status: DC
Start: 2023-09-06 — End: 2023-09-06

## 2023-09-06 NOTE — Progress Notes (Signed)
 Post-Op Visit Note   Patient: [name] [name]           Date of Birth: 27-Sep-1970           MRN: [id number] Visit Date: 09/06/2023 PCP: Patient, No Pcp Per  Chief Complaint:  Chief Complaint  Patient presents with   Left Foot - Routine Post Op    07/20/23 left 4th and 5th ray amputatation    HPI:  HPI Patient is a 53 year old gentleman seen with fourth and fifth ray amputations February 19 of the left foot has been using silver Telfa dressings has not been packing.  He has been using nitroglycerin patches along.  Shoulders and chest  Endorses chills, completed a course of doxycycline 2 weeks ago  The patient is still smoking.  Unfortunately last week most of the week he was without his insulin or blue blood glucose monitor Ortho Exam On examination left foot the distal one half of his incision is gaped about 3 mm wide there is epiboly this has scant serosanguineous drainage and a foul odor there is no exposed bone today no surrounding erythema  Visit Diagnoses: No diagnosis found.  Plan: Seen together with Dr. Lajoyce Corners.  Recommended continuing his doxycycline.  Incision debrided by Dr. Lajoyce Corners he will pack open with Vashe soaked gauze minimize weightbearing discussed concern for further limb salvage surgery with the patient BKA versus transmet.  Follow-Up Instructions: No follow-ups on file.   Imaging: MR BRAIN WO CONTRAST Result Date: 09/06/2023  [facility] [address] [place], [place] [address] [phone] NEUROIMAGING REPORT STUDY DATE: 09/06/2023 PATIENT NAME: [name] [name] DOB: 05-21-1971 MRN: [id number] ORDERING CLINICIAN: Dr. Frances Furbish CLINICAL HISTORY: 53 year old patient being evaluated for dizziness COMPARISON FILMS: CT head without contrast 02/07/2023 EXAM: MRI brain without contrast TECHNIQUE: Sagittal T1, axial T1, T2, FLAIR, DWI, ADC map, SWI, coronal T2 images were obtained through the brain CONTRAST: None IMAGING SITE: [place] [facility] FINDINGS: The  brain parenchyma shows mild changes of chronic small vessel disease and supratentorial cortical atrophy.  No structural lesion, tumor or infarct is noted.  Diffusion-weighted imaging negative for acute ischemia.  SWI images do not show significant microhemorrhages.  Subarachnoid spaces and ventricular system appear normal.  Extra-axial brain structures appear normal.  Calvarium shows no abnormalities.  Orbits appear unremarkable paranasal sinuses show mild chronic mucosal inflammatory changes.  The pituitary gland and cerebellar tonsils appear normal.The visualized portion of the cervical spine shows minor disc degenerative changes.  Flow-voids of large vessel intracranial circulation appear to be patent.      MRI scan of the brain without contrast showing only minor chronic small vessel disease changes and supratentorial cortical atrophy. INTERPRETING PHYSICIAN: Delia Heady, MD Certified in  Neuroimaging by American Society of Neuroimaging and [employer]   Orders:  No orders of the defined types were placed in this encounter.  Meds ordered this encounter  Medications   doxycycline (VIBRA-TABS) 100 MG tablet    Sig: Take 1 tablet (100 mg total) by mouth 2 (two) times daily.    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) 07/12/2023   Gangrene of toe of left foot (HCC) 06/24/2023   Type 2 diabetes mellitus with diabetic polyneuropathy, with long-term current use of insulin (HCC) 04/11/2023   Type 2 diabetes mellitus with hyperglycemia,  with long-term current use of insulin (HCC) 04/11/2023   Diabetes mellitus (HCC) 04/11/2023   PTSD (post-traumatic stress disorder) 03/14/2023   Aortic valve sclerosis 09/16/2022   Hypoglycemia 08/27/2022   COPD GOLD 1/ AB 06/11/2022   Syncope and collapse 12/13/2021   Bifascicular block 12/13/2021   SIRS (systemic  inflammatory response syndrome) (HCC) 12/13/2021   Renal insufficiency 12/13/2021   Esophageal motility disorder 12/13/2021   Heartburn    Scrotal abscess 11/19/2020   Severe sepsis (HCC) 09/03/2020   Non-adherence to medical treatment 05/27/2020   Barrett's esophagus 01/23/2019   Diarrhea 01/23/2019   MDD (major depressive disorder), severe (HCC) 12/17/2018   DKA, type 2 (HCC) 11/07/2018   High anion gap metabolic acidosis 03/22/2018   Epigastric abdominal pain 03/22/2018   Nausea vomiting and diarrhea 03/22/2018   AKI (acute kidney injury) (HCC) 03/22/2018   Uncontrolled type 2 diabetes mellitus with hyperglycemia (HCC) 03/22/2018   ARF (acute renal failure) (HCC) 05/30/2017   Mixed hyperlipidemia 03/31/2017   Diabetic polyneuropathy (HCC) 03/31/2017   Vitamin B12 deficiency 03/31/2017   Vitamin D deficiency 03/31/2017   Right bundle branch block 02/14/2017   DKA (diabetic ketoacidosis) (HCC) 10/15/2016   Gastroesophageal reflux disease 10/15/2016   Benign hypertension 10/15/2016   Leukocytosis 11/25/2015   Intractable nausea and vomiting 11/25/2015   Uncontrolled type 2 diabetes mellitus with hypoglycemia, with long-term current use of insulin (HCC) 11/25/2015   Dental abscess 11/25/2015   Nausea and vomiting 11/25/2015   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 03/2015 Cath: LM nl, LAD mild diff dzs throughout w/ 50p/d, LCX diff dzs, 55m, RCA diff dzs, 2m-->Med rx; b. 12/2020 MV: EF 57%, small/mild apical inferior defect w/ partial rev/mild ischemia.   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, bilateral    Sleep apnea 2016   does not use CPAP   Type 2 diabetes mellitus (HCC)     Family History  Problem Relation Age of Onset   Diabetes Mother    Hypertension Mother    Cancer Mother    Throat cancer Mother    Diabetes Father    Hypertension Father    Hyperlipidemia Father    Congestive Heart Failure Sister    Colon cancer Neg Hx    Pancreatic cancer Neg Hx    Liver disease Neg Hx     Past Surgical History:  Procedure Laterality Date   ABDOMINAL AORTOGRAM W/LOWER EXTREMITY N/A 06/20/2023   Procedure: ABDOMINAL AORTOGRAM W/LOWER EXTREMITY;  Surgeon: Daria Pastures, MD;  Location: [facility];  Service: Cardiovascular;  Laterality: N/A;   AMPUTATION Left 06/24/2023   Procedure: LEFT 5TH TOE AMPUTATION;  Surgeon: Nadara Mustard, MD;  Location: [facility] OR;  Service: Orthopedics;  Laterality: Left;   AMPUTATION Left 07/20/2023   Procedure: LEFT FOOT 4TH AND 5TH RAY AMPUTATION;  Surgeon: Nadara Mustard, MD;  Location: [facility] OR;  Service: Orthopedics;  Laterality: Left;   ANKLE SURGERY Right 1982   "had extra bones in there; took them out"   APPENDECTOMY  1975   BIOPSY  12/26/2018   Procedure: BIOPSY;  Surgeon: Malissa Hippo, MD;  [facility]: [facility];  Service: Endoscopy;;  duodenal biopsies   BIOPSY  03/23/2019   Procedure: BIOPSY;  Surgeon: Malissa Hippo, MD;  [facility]: [facility];  Service: Endoscopy;;  esophagus   CARDIAC CATHETERIZATION N/A 03/28/2015   Procedure: Left Heart Cath and Coronary Angiography;  Surgeon: Yates Decamp, MD;  Location: [facility] INVASIVE CV LAB;  Service: Cardiovascular;  Laterality: N/A;   CARDIAC CATHETERIZATION N/A 03/28/2015    Procedure: Intravascular Pressure Wire/FFR Study;  Surgeon: Yates Decamp, MD;  Location: [facility] INVASIVE CV LAB;  Service: Cardiovascular;  Laterality: N/A;   CARPAL TUNNEL RELEASE Left ~ 2008   COLONOSCOPY WITH ESOPHAGOGASTRODUODENOSCOPY (EGD)     COLONOSCOPY WITH PROPOFOL N/A 12/24/2019   Procedure: COLONOSCOPY WITH PROPOFOL;  Surgeon: Malissa Hippo, MD;  [facility]: [facility];  Service: Endoscopy;  Laterality: N/A;  955   ELBOW FRACTURE SURGERY Left ~ 2008   ESOPHAGEAL MANOMETRY N/A 09/16/2021   Procedure: ESOPHAGEAL MANOMETRY (EM);  Surgeon: Shellia Cleverly, DO;  Location: [facility];  Service: Endoscopy;  Laterality: N/A;   ESOPHAGOGASTRODUODENOSCOPY N/A 12/26/2018   Procedure: ESOPHAGOGASTRODUODENOSCOPY (EGD);  Surgeon: Malissa Hippo, MD;  Location: [facility];  Service: Endoscopy;  Laterality: N/A;   ESOPHAGOGASTRODUODENOSCOPY (EGD) WITH PROPOFOL N/A 03/23/2019   Procedure: ESOPHAGOGASTRODUODENOSCOPY (EGD) WITH PROPOFOL;  Surgeon: Malissa Hippo, MD;  [facility]: [facility];  Service: Endoscopy;  Laterality: N/A;  7:30   FRACTURE SURGERY     ankle and elbow   PERIPHERAL VASCULAR BALLOON ANGIOPLASTY  06/20/2023   Procedure: PERIPHERAL VASCULAR BALLOON ANGIOPLASTY;  Surgeon: Daria Pastures, MD;  Location: [facility];  Service: Cardiovascular;;   PH IMPEDANCE STUDY N/A 09/16/2021   Procedure: PH IMPEDANCE STUDY;  Surgeon: Shellia Cleverly, DO;  Location: [facility];  Service: Endoscopy;  Laterality: N/A;   PILONIDAL CYST EXCISION N/A 03/24/2017   Procedure: EXCISION CHRONIC  PILONIDAL ABSCESS;  Surgeon: [name] Miyamoto, MD;  Location: [facility];  Service: General;  Laterality: N/A;   SCROTAL EXPLORATION N/A 11/13/2020   Procedure: EXCISION OF SEBACEOUS CYSTS, SCROTUM;  Surgeon: Malen Gauze, MD;  Location: [facility];  Service: Urology;  Laterality: N/A;   TENDON REPAIR Left ~ 2004   "main tendon in my ankle"   Social History   Occupational History   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

## 2023-09-07 ENCOUNTER — Encounter: Payer: MEDICAID | Admitting: Family

## 2023-09-13 ENCOUNTER — Ambulatory Visit: Payer: MEDICAID | Admitting: Orthopedic Surgery

## 2023-09-13 DIAGNOSIS — Z89422 Acquired absence of other left toe(s): Secondary | ICD-10-CM

## 2023-09-13 DIAGNOSIS — S98132A Complete traumatic amputation of one left lesser toe, initial encounter: Secondary | ICD-10-CM

## 2023-09-14 ENCOUNTER — Encounter: Payer: Self-pay | Admitting: Orthopedic Surgery

## 2023-09-14 NOTE — Progress Notes (Signed)
 Office Visit Note   Patient: [name] [name]           Date of Birth: 1970-07-03           MRN: [id number] Visit Date: 09/13/2023              Requested by: No referring provider defined for this encounter. PCP: Patient, No Pcp Per  Chief Complaint  Patient presents with   Left Foot - Wound Check      HPI: Patient is a 53 year old gentleman who is 2 months status post left foot 4th and 5th ray amputation.  Assessment & Plan: Visit Diagnoses:  1. 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  Follow-Up Instructions: Return in about 2 weeks (around 09/27/2023).   Ortho Exam  Patient is alert, oriented, no adenopathy, well-dressed, normal affect, normal respiratory effort. Examination the wound measures 3 x 1 x 1 cm.  There is 75% healthy granulation tissue there is no exposed bone or tendon there is no cellulitis.  Imaging: No results found. No images are attached to the encounter.  Labs: Lab Results  Component Value Date   HGBA1C 8.7 (A) 07/12/2023   HGBA1C 9.0 (A) 04/11/2023   HGBA1C 13.2 (H) 08/29/2022   ESRSEDRATE 20 (H) 06/18/2022   ESRSEDRATE 1 08/20/2015   CRP <0.5 06/18/2022   CRP 2.5 01/23/2019   CRP <0.5 08/20/2015   REPTSTATUS 06/18/2023 FINAL 06/13/2023   CULT  06/13/2023    NO GROWTH 5 DAYS Performed at [facility], [address] [place], [place] [address]      Lab Results  Component Value Date   ALBUMIN 3.2 (L) 08/27/2022   ALBUMIN 2.8 (L) 07/26/2022   ALBUMIN 4.1 07/06/2022    Lab Results  Component Value Date   MG 1.8 02/08/2023   MG 2.0 08/29/2022   MG 1.5 (L) 08/28/2022   No results found for: "VD25OH"  No results found for: "PREALBUMIN"    Latest Ref Rng & Units 07/20/2023    7:58 AM 06/20/2023    5:32 AM 06/13/2023    6:05 PM  CBC EXTENDED  WBC 4.0 - 10.5 K/uL 14.1   13.2   RBC 4.22 - 5.81 MIL/uL 4.08   4.59   Hemoglobin 13.0 - 17.0 g/dL 19.1  47.8  29.5   HCT 39.0 -  52.0 % 37.1  40.0  43.5   Platelets 150 - 400 K/uL 297   251   NEUT# 1.7 - 7.7 K/uL   9.7   Lymph# 0.7 - 4.0 K/uL   2.4      There is no height or weight on file to calculate BMI.  Orders:  No orders of the defined types were placed in this encounter.  No orders of the defined types were placed in this encounter.    Procedures: No procedures performed  Clinical Data: No additional findings.  ROS:  All other systems negative, except as noted in the HPI. Review of Systems  Objective: Vital Signs: There were no vitals taken for this visit.  Specialty Comments:  No specialty comments available.  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) 07/12/2023   Gangrene of toe of left foot (HCC) 06/24/2023   Type 2 diabetes mellitus with diabetic polyneuropathy, with long-term current use of insulin (HCC) 04/11/2023   Type 2 diabetes mellitus with  hyperglycemia, with long-term current use of insulin (HCC) 04/11/2023   Diabetes mellitus (HCC) 04/11/2023   PTSD (post-traumatic stress disorder) 03/14/2023   Aortic valve sclerosis 09/16/2022   Hypoglycemia 08/27/2022   COPD GOLD 1/ AB 06/11/2022   Syncope and collapse 12/13/2021   Bifascicular block 12/13/2021   SIRS (systemic inflammatory response syndrome) (HCC) 12/13/2021   Renal insufficiency 12/13/2021   Esophageal motility disorder 12/13/2021   Heartburn    Scrotal abscess 11/19/2020   Severe sepsis (HCC) 09/03/2020   Non-adherence to medical treatment 05/27/2020   Barrett's esophagus 01/23/2019   Diarrhea 01/23/2019   MDD (major depressive disorder), severe (HCC) 12/17/2018   DKA, type 2 (HCC) 11/07/2018   High anion gap metabolic acidosis 03/22/2018   Epigastric abdominal pain 03/22/2018   Nausea vomiting and diarrhea 03/22/2018   AKI (acute kidney injury) (HCC) 03/22/2018   Uncontrolled type 2 diabetes mellitus with hyperglycemia (HCC) 03/22/2018    ARF (acute renal failure) (HCC) 05/30/2017   Mixed hyperlipidemia 03/31/2017   Diabetic polyneuropathy (HCC) 03/31/2017   Vitamin B12 deficiency 03/31/2017   Vitamin D deficiency 03/31/2017   Right bundle branch block 02/14/2017   DKA (diabetic ketoacidosis) (HCC) 10/15/2016   Gastroesophageal reflux disease 10/15/2016   Benign hypertension 10/15/2016   Leukocytosis 11/25/2015   Intractable nausea and vomiting 11/25/2015   Uncontrolled type 2 diabetes mellitus with hypoglycemia, with long-term current use of insulin (HCC) 11/25/2015   Dental abscess 11/25/2015   Nausea and vomiting 11/25/2015   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 03/2015 Cath: LM nl, LAD mild diff dzs throughout w/ 50p/d, LCX diff dzs, 63m, RCA diff dzs, 38m-->Med rx; b. 12/2020 MV: EF 57%, small/mild apical inferior defect w/ partial rev/mild ischemia.   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, bilateral    Sleep apnea 2016   does not use CPAP   Type 2 diabetes mellitus (HCC)     Family History  Problem Relation Age of Onset   Diabetes Mother    Hypertension Mother    Cancer Mother    Throat cancer Mother    Diabetes Father    Hypertension Father    Hyperlipidemia Father    Congestive Heart Failure Sister    Colon  cancer Neg Hx    Pancreatic cancer Neg Hx    Liver disease Neg Hx     Past Surgical History:  Procedure Laterality Date   ABDOMINAL AORTOGRAM W/LOWER EXTREMITY N/A 06/20/2023   Procedure: ABDOMINAL AORTOGRAM W/LOWER EXTREMITY;  Surgeon: Daria Pastures, MD;  Location: [facility];  Service: Cardiovascular;  Laterality: N/A;   AMPUTATION Left 06/24/2023   Procedure: LEFT 5TH TOE AMPUTATION;  Surgeon: Nadara Mustard, MD;  Location: [facility] OR;  Service: Orthopedics;  Laterality: Left;   AMPUTATION Left 07/20/2023   Procedure: LEFT FOOT 4TH AND 5TH RAY AMPUTATION;  Surgeon: Nadara Mustard, MD;  Location: [facility] OR;  Service: Orthopedics;  Laterality: Left;   ANKLE SURGERY Right 1982   "had extra bones in there; took them out"   APPENDECTOMY  1975   BIOPSY  12/26/2018   Procedure: BIOPSY;  Surgeon: Malissa Hippo, MD;  Location: [facility];  Service: Endoscopy;;  duodenal biopsies   BIOPSY  03/23/2019   Procedure: BIOPSY;  Surgeon: Malissa Hippo, MD;  [facility]: [facility];  Service: Endoscopy;;  esophagus   CARDIAC CATHETERIZATION N/A 03/28/2015   Procedure: Left Heart Cath and Coronary Angiography;  Surgeon: Yates Decamp, MD;  Location: [facility] LAB;  Service: Cardiovascular;  Laterality: N/A;   CARDIAC CATHETERIZATION N/A 03/28/2015   Procedure: Intravascular Pressure Wire/FFR Study;  Surgeon: Yates Decamp, MD;  Location: [facility] INVASIVE CV LAB;  Service: Cardiovascular;  Laterality: N/A;   CARPAL TUNNEL RELEASE Left ~ 2008   COLONOSCOPY WITH ESOPHAGOGASTRODUODENOSCOPY (EGD)     COLONOSCOPY WITH PROPOFOL N/A 12/24/2019   Procedure: COLONOSCOPY WITH PROPOFOL;  Surgeon: Malissa Hippo, MD;  Location: [facility];  Service: Endoscopy;  Laterality: N/A;  955   ELBOW FRACTURE SURGERY Left ~ 2008   ESOPHAGEAL MANOMETRY N/A 09/16/2021   Procedure: ESOPHAGEAL MANOMETRY (EM);  Surgeon: Shellia Cleverly, DO;  Location: [facility];  Service: Endoscopy;  Laterality: N/A;    ESOPHAGOGASTRODUODENOSCOPY N/A 12/26/2018   Procedure: ESOPHAGOGASTRODUODENOSCOPY (EGD);  Surgeon: Malissa Hippo, MD;  [facility]: [facility];  Service: Endoscopy;  Laterality: N/A;   ESOPHAGOGASTRODUODENOSCOPY (EGD) WITH PROPOFOL N/A 03/23/2019   Procedure: ESOPHAGOGASTRODUODENOSCOPY (EGD) WITH PROPOFOL;  Surgeon: Malissa Hippo, MD;  Location: [facility];  Service: Endoscopy;  Laterality: N/A;  7:30   FRACTURE SURGERY     ankle and elbow   PERIPHERAL VASCULAR BALLOON ANGIOPLASTY  06/20/2023   Procedure: PERIPHERAL VASCULAR BALLOON ANGIOPLASTY;  Surgeon: Daria Pastures, MD;  Location: [facility];  Service: Cardiovascular;;   PH IMPEDANCE STUDY N/A 09/16/2021   Procedure: PH IMPEDANCE STUDY;  Surgeon: Shellia Cleverly, DO;  Location: [facility];  Service: Endoscopy;  Laterality: N/A;   PILONIDAL CYST EXCISION N/A 03/24/2017   Procedure: EXCISION CHRONIC  PILONIDAL ABSCESS;  Surgeon: [name] Miyamoto, MD;  Location: [facility];  Service: General;  Laterality: N/A;   SCROTAL EXPLORATION N/A 11/13/2020   Procedure: EXCISION OF SEBACEOUS CYSTS, SCROTUM;  Surgeon: Malen Gauze, MD;  Location: [facility];  Service: Urology;  Laterality: N/A;   TENDON REPAIR Left ~ 2004   "main tendon in my ankle"   Social History   Occupational History   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

## 2023-09-15 ENCOUNTER — Encounter: Payer: MEDICAID | Admitting: Orthopedic Surgery

## 2023-09-21 ENCOUNTER — Encounter: Payer: Self-pay | Admitting: Neurology

## 2023-09-27 ENCOUNTER — Ambulatory Visit (INDEPENDENT_AMBULATORY_CARE_PROVIDER_SITE_OTHER): Payer: MEDICAID | Admitting: Orthopedic Surgery

## 2023-09-27 DIAGNOSIS — S98132A Complete traumatic amputation of one left lesser toe, initial encounter: Secondary | ICD-10-CM

## 2023-09-27 DIAGNOSIS — Z89422 Acquired absence of other left toe(s): Secondary | ICD-10-CM

## 2023-10-02 ENCOUNTER — Encounter: Payer: Self-pay | Admitting: Orthopedic Surgery

## 2023-10-02 NOTE — Progress Notes (Signed)
 Office Visit Note   Patient: [name] [name]           Date of Birth: 04-Nov-1970           MRN: [id number] Visit Date: 09/27/2023              Requested by: No referring provider defined for this encounter. PCP: Patient, No Pcp Per  Chief Complaint  Patient presents with   Left Foot - Routine Post Op    07/20/23 left 4th and 5th ray amputation      HPI: Patient is a 53 year old gentleman who is status post left foot 4th and 5th ray amputation on February 19.  Patient is using Vashe dressing changes and a nitroglycerin  patch.  Assessment & Plan: Visit Diagnoses:  1. 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  Recommended Neosporin to the wounds.  Follow-Up Instructions: Return in about 4 weeks (around 10/25/2023).   Ortho Exam  Patient is alert, oriented, no adenopathy, well-dressed, normal affect, normal respiratory effort. Examination patient is 2 months out from surgery.  He has an area of 1 x 2 cm with healthy granulation tissue.  There is no exposed bone or tendon.  Imaging: No results found. No images are attached to the encounter.  Labs: Lab Results  Component Value Date   HGBA1C 8.7 (A) 07/12/2023   HGBA1C 9.0 (A) 04/11/2023   HGBA1C 13.2 (H) 08/29/2022   ESRSEDRATE 20 (H) 06/18/2022   ESRSEDRATE 1 08/20/2015   CRP <0.5 06/18/2022   CRP 2.5 01/23/2019   CRP <0.5 08/20/2015   REPTSTATUS 06/18/2023 FINAL 06/13/2023   CULT  06/13/2023    NO GROWTH 5 DAYS Performed at [facility], [address] [place], [place] [address]      Lab Results  Component Value Date   ALBUMIN 3.2 (L) 08/27/2022   ALBUMIN 2.8 (L) 07/26/2022   ALBUMIN 4.1 07/06/2022    Lab Results  Component Value Date   MG 1.8 02/08/2023   MG 2.0 08/29/2022   MG 1.5 (L) 08/28/2022   No results found for: "VD25OH"  No results found for: "PREALBUMIN"    Latest Ref Rng & Units 07/20/2023    7:58 AM 06/20/2023     5:32 AM 06/13/2023    6:05 PM  CBC EXTENDED  WBC 4.0 - 10.5 K/uL 14.1   13.2   RBC 4.22 - 5.81 MIL/uL 4.08   4.59   Hemoglobin 13.0 - 17.0 g/dL 82.9  56.2  13.0   HCT 39.0 - 52.0 % 37.1  40.0  43.5   Platelets 150 - 400 K/uL 297   251   NEUT# 1.7 - 7.7 K/uL   9.7   Lymph# 0.7 - 4.0 K/uL   2.4      There is no height or weight on file to calculate BMI.  Orders:  No orders of the defined types were placed in this encounter.  No orders of the defined types were placed in this encounter.    Procedures: No procedures performed  Clinical Data: No additional findings.  ROS:  All other systems negative, except as noted in the HPI. Review of Systems  Objective: Vital Signs: There were no vitals taken for this visit.  Specialty Comments:  No specialty comments available.  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) 07/12/2023  Gangrene of toe of left foot (HCC) 06/24/2023   Type 2 diabetes mellitus with diabetic polyneuropathy, with long-term current use of insulin  (HCC) 04/11/2023   Type 2 diabetes mellitus with hyperglycemia, with long-term current use of insulin  (HCC) 04/11/2023   Diabetes mellitus (HCC) 04/11/2023   PTSD (post-traumatic stress disorder) 03/14/2023   Aortic valve sclerosis 09/16/2022   Hypoglycemia 08/27/2022   COPD GOLD 1/ AB 06/11/2022   Syncope and collapse 12/13/2021   Bifascicular block 12/13/2021   SIRS (systemic inflammatory response syndrome) (HCC) 12/13/2021   Renal insufficiency 12/13/2021   Esophageal motility disorder 12/13/2021   Heartburn    Scrotal abscess 11/19/2020   Severe sepsis (HCC) 09/03/2020   Non-adherence to medical treatment 05/27/2020   Barrett's esophagus 01/23/2019   Diarrhea 01/23/2019   MDD (major depressive disorder), severe (HCC) 12/17/2018   DKA, type 2 (HCC) 11/07/2018   High anion gap metabolic acidosis 03/22/2018   Epigastric abdominal pain  03/22/2018   Nausea vomiting and diarrhea 03/22/2018   AKI (acute kidney injury) (HCC) 03/22/2018   Uncontrolled type 2 diabetes mellitus with hyperglycemia (HCC) 03/22/2018   ARF (acute renal failure) (HCC) 05/30/2017   Mixed hyperlipidemia 03/31/2017   Diabetic polyneuropathy (HCC) 03/31/2017   Vitamin B12 deficiency 03/31/2017   Vitamin D  deficiency 03/31/2017   Right bundle branch block 02/14/2017   DKA (diabetic ketoacidosis) (HCC) 10/15/2016   Gastroesophageal reflux disease 10/15/2016   Benign hypertension 10/15/2016   Leukocytosis 11/25/2015   Intractable nausea and vomiting 11/25/2015   Uncontrolled type 2 diabetes mellitus with hypoglycemia, with long-term current use of insulin  (HCC) 11/25/2015   Dental abscess 11/25/2015   Nausea and vomiting 11/25/2015   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 03/2015 Cath: LM nl, LAD mild diff dzs throughout w/ 50p/d, LCX diff dzs, 77m, RCA diff dzs, 48m-->Med rx; b. 12/2020 MV: EF 57%, small/mild apical inferior defect w/ partial rev/mild ischemia.   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, bilateral    Sleep apnea 2016   does not use CPAP   Type 2 diabetes mellitus (HCC)     Family History  Problem Relation Age of Onset   Diabetes Mother     Hypertension Mother    Cancer Mother    Throat cancer Mother    Diabetes Father    Hypertension Father    Hyperlipidemia Father    Congestive Heart Failure Sister    Colon cancer Neg Hx    Pancreatic cancer Neg Hx    Liver disease Neg Hx     Past Surgical History:  Procedure Laterality Date   ABDOMINAL AORTOGRAM W/LOWER EXTREMITY N/A 06/20/2023   Procedure: ABDOMINAL AORTOGRAM W/LOWER EXTREMITY;  Surgeon: Delaney Fearing  S, MD;  Location: [facility];  Service: Cardiovascular;  Laterality: N/A;   AMPUTATION Left 06/24/2023   Procedure: LEFT 5TH TOE AMPUTATION;  Surgeon: Timothy Ford, MD;  Location: [facility] OR;  Service: Orthopedics;  Laterality: Left;   AMPUTATION Left 07/20/2023   Procedure: LEFT FOOT 4TH AND 5TH RAY AMPUTATION;  Surgeon: Timothy Ford, MD;  Location: [facility] OR;  Service: Orthopedics;  Laterality: Left;   ANKLE SURGERY Right 1982   "had extra bones in there; took them out"   APPENDECTOMY  1975   BIOPSY  12/26/2018   Procedure: BIOPSY;  Surgeon: Ruby Corporal, MD;  [facility]: [facility];  Service: Endoscopy;;  duodenal biopsies   BIOPSY  03/23/2019   Procedure: BIOPSY;  Surgeon: Ruby Corporal, MD;  [facility]: [facility];  Service: Endoscopy;;  esophagus   CARDIAC CATHETERIZATION N/A 03/28/2015   Procedure: Left Heart Cath and Coronary Angiography;  Surgeon: Knox Perl, MD;  Location: [facility] IN[facility] LAB;  Service: Cardiovascular;  Laterality: N/A;   CARDIAC CATHETERIZATION N/A 03/28/2015   Procedure: Intravascular Pressure Wire/FFR Study;  Surgeon: Knox Perl, MD;  Location: [facility] IN[facility] LAB;  Service: Cardiovascular;  Laterality: N/A;   CARPAL TUNNEL RELEASE Left ~ 2008   COLONOSCOPY WITH ESOPHAGOGASTRODUODENOSCOPY (EGD)     COLONOSCOPY WITH PROPOFOL  N/A 12/24/2019   Procedure: COLONOSCOPY WITH PROPOFOL ;  Surgeon: Ruby Corporal, MD;  Location: [facility];  Service: Endoscopy;  Laterality: N/A;  955   ELBOW FRACTURE SURGERY Left ~ 2008   ESOPHAGEAL  MANOMETRY N/A 09/16/2021   Procedure: ESOPHAGEAL MANOMETRY (EM);  Surgeon: Annis Kinder, DO;  Location: [facility];  Service: Endoscopy;  Laterality: N/A;   ESOPHAGOGASTRODUODENOSCOPY N/A 12/26/2018   Procedure: ESOPHAGOGASTRODUODENOSCOPY (EGD);  Surgeon: Ruby Corporal, MD;  Location: [facility];  Service: Endoscopy;  Laterality: N/A;   ESOPHAGOGASTRODUODENOSCOPY (EGD) WITH PROPOFOL  N/A 03/23/2019   Procedure: ESOPHAGOGASTRODUODENOSCOPY (EGD) WITH PROPOFOL ;  Surgeon: Ruby Corporal, MD;  Location: [facility];  Service: Endoscopy;  Laterality: N/A;  7:30   FRACTURE SURGERY     ankle and elbow   PERIPHERAL VASCULAR BALLOON ANGIOPLASTY  06/20/2023   Procedure: PERIPHERAL VASCULAR BALLOON ANGIOPLASTY;  Surgeon: Philipp Brawn, MD;  Location: [facility];  Service: Cardiovascular;;   PH IMPEDANCE STUDY N/A 09/16/2021   Procedure: PH IMPEDANCE STUDY;  Surgeon: Annis Kinder, DO;  Location: [facility];  Service: Endoscopy;  Laterality: N/A;   PILONIDAL CYST EXCISION N/A 03/24/2017   Procedure: EXCISION CHRONIC  PILONIDAL ABSCESS;  Surgeon: Oza Blumenthal, MD;  Location: [facility];  Service: General;  Laterality: N/A;   SCROTAL EXPLORATION N/A 11/13/2020   Procedure: EXCISION OF SEBACEOUS CYSTS, SCROTUM;  Surgeon: Marco Severs, MD;  Location: [facility];  Service: Urology;  Laterality: N/A;   TENDON REPAIR Left ~ 2004   "main tendon in my ankle"   Social History   Occupational History   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  Tobacco Use   Smoking status: Every Day    Current packs/day: 1.00    Average packs/day: 1 pack/day for 34.0 years (34.0 ttl pk-yrs)    Types: Cigarettes    Passive exposure: Never   Smokeless tobacco: Never   Tobacco comments:    1/2 ppd -1 ppd - patient states "trying to cut back" as of 06/18/23 km  Vaping Use   Vaping status: Never Used  Substance and Sexual Activity   Alcohol use: Never  Alcohol/week: 0.0 standard drinks of alcohol    Drug use: Never   Sexual activity: Not Currently

## 2023-10-27 ENCOUNTER — Ambulatory Visit (INDEPENDENT_AMBULATORY_CARE_PROVIDER_SITE_OTHER): Payer: MEDICAID | Admitting: Orthopedic Surgery

## 2023-10-27 ENCOUNTER — Encounter: Payer: Self-pay | Admitting: Internal Medicine

## 2023-10-27 ENCOUNTER — Ambulatory Visit: Payer: MEDICAID | Admitting: Internal Medicine

## 2023-10-27 VITALS — BP 124/78 | HR 105 | Ht 68.0 in | Wt 153.0 lb

## 2023-10-27 DIAGNOSIS — E1122 Type 2 diabetes mellitus with diabetic chronic kidney disease: Secondary | ICD-10-CM | POA: Diagnosis not present

## 2023-10-27 DIAGNOSIS — E1142 Type 2 diabetes mellitus with diabetic polyneuropathy: Secondary | ICD-10-CM | POA: Diagnosis not present

## 2023-10-27 DIAGNOSIS — Z89422 Acquired absence of other left toe(s): Secondary | ICD-10-CM

## 2023-10-27 DIAGNOSIS — E1165 Type 2 diabetes mellitus with hyperglycemia: Secondary | ICD-10-CM | POA: Diagnosis not present

## 2023-10-27 DIAGNOSIS — Z794 Long term (current) use of insulin: Secondary | ICD-10-CM | POA: Diagnosis not present

## 2023-10-27 DIAGNOSIS — E1159 Type 2 diabetes mellitus with other circulatory complications: Secondary | ICD-10-CM | POA: Diagnosis not present

## 2023-10-27 DIAGNOSIS — S98132A Complete traumatic amputation of one left lesser toe, initial encounter: Secondary | ICD-10-CM

## 2023-10-27 DIAGNOSIS — N1832 Chronic kidney disease, stage 3b: Secondary | ICD-10-CM

## 2023-10-27 LAB — POCT GLYCOSYLATED HEMOGLOBIN (HGB A1C): Hemoglobin A1C: 7.3 % — AB (ref 4.0–5.6)

## 2023-10-27 MED ORDER — LANTUS SOLOSTAR 100 UNIT/ML ~~LOC~~ SOPN: 12.0000 [IU] | PEN_INJECTOR | Freq: Every day | SUBCUTANEOUS | 11 refills | Status: DC

## 2023-10-27 NOTE — Progress Notes (Signed)
 Name: [name] [name]  MRN/ DOB: [id number], 18-Mar-1971   Age/ Sex: 53 y.o., male    PCP: Patient, No Pcp Per   Reason for Endocrinology Evaluation: Type 2 Diabetes Mellitus     Date of Initial Endocrinology Visit: 04/11/2023    PATIENT IDENTIFIER: [name] [name] is a 53 y.o. male with a past medical history of DM, HTN, CAD, COPD, OSA. The patient presented for initial endocrinology clinic visit on 04/11/2023 for consultative assistance with his diabetes management.    HPI: [name] [name]    Diagnosed with DM 2017                Hemoglobin A1c has ranged from 7.8% in 2017, peaking at 15.5% in 2022.  Patient with history of DKA   On his initial visit to our clinic he had an A1c of 9.0%, patient was on NovoLog  Mix, metformin , and I switched him to Lantus /NovoLog   Metformin  discontinued during hospitalization 06/2023  I had prescribed OmniPod 07/2023 and referred him to our CDE for training, but per patient this would not be covered by his insurance  SUBJECTIVE:   During the last visit (07/12/2023): A1c 8.7%  Today (10/27/23): [name] [name] is here for follow-up on diabetes management.  He checks his blood sugars both times daily. The patient has had hypoglycemic episodes since the last clinic visit. He is symptomatic .     Patient continues to follow-up with Dr. Julio Ohm for history of left fifth toe amputation that occurred 06/2023, he is s/p left fourth toe amputation 07/2023  He was evaluated by neurology for dizziness, found to have orthostatic hypotension, that was attributed to medications such as Abilify , Xanax , hydrocodone   Continues with chronic constipation  He did have an episode of food poisoning on 5/1st lasting ~4 days   Patient follows with psych  HOME DIABETES REGIMEN: Lantus  14 units daily Novolog  4 units TIDQAC CF: NovoLog  (BG -130/45) TIDQAC   Statin: yes ACE-I/ARB: no   CONTINUOUS GLUCOSE MONITORING RECORD INTERPRETATION    Dates of Recording:  5/16-5/29/2025  Sensor description:freestyle libre 3  Results statistics:   CGM use % of time 96  Average and SD 190/36  Time in range 47  %  % Time Above 180 32  % Time above 250 20  % Time Below target 1   Glycemic patterns summary: BG's trend down overnight and increase during the day  Hyperglycemic episodes postprandial  Hypoglycemic episodes occurred overnight  Overnight periods: Trends down     DIABETIC COMPLICATIONS: Microvascular complications:  Neuropathy, s/p left fifth toe amputation Denies: CKD  Last eye exam: Completed 2 yrs   Macrovascular complications:  CAD Denies:  PVD, CVA   PAST HISTORY: Past Medical History:  Past Medical History:  Diagnosis Date   Anxiety    Arthritis    Asthma    CAD (coronary artery disease)    a. 03/2015 Cath: LM nl, LAD mild diff dzs throughout w/ 50p/d, LCX diff dzs, 43m, RCA diff dzs, 75m-->Med rx; b. 12/2020 MV: EF 57%, small/mild apical inferior defect w/ partial rev/mild ischemia.   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, bilateral    Sleep apnea 2016   does not use CPAP   Type 2 diabetes mellitus (HCC)    Past Surgical History:  Past Surgical History:  Procedure Laterality Date   ABDOMINAL AORTOGRAM W/LOWER EXTREMITY N/A 06/20/2023   Procedure: ABDOMINAL AORTOGRAM W/LOWER EXTREMITY;  Surgeon: Philipp Brawn, MD;  Location: [facility] INVASIVE CV LAB;  Service: Cardiovascular;  Laterality: N/A;   AMPUTATION Left 06/24/2023   Procedure: LEFT 5TH TOE AMPUTATION;  Surgeon: Timothy Ford, MD;  Location: [facility] OR;  Service: Orthopedics;  Laterality: Left;   AMPUTATION Left  07/20/2023   Procedure: LEFT FOOT 4TH AND 5TH RAY AMPUTATION;  Surgeon: Timothy Ford, MD;  Location: [facility] OR;  Service: Orthopedics;  Laterality: Left;   ANKLE SURGERY Right 1982   "had extra bones in there; took them out"   APPENDECTOMY  1975   BIOPSY  12/26/2018   Procedure: BIOPSY;  Surgeon: Ruby Corporal, MD;  Location: [facility];  Service: Endoscopy;;  duodenal biopsies   BIOPSY  03/23/2019   Procedure: BIOPSY;  Surgeon: Ruby Corporal, MD;  Location: [facility];  Service: Endoscopy;;  esophagus   CARDIAC CATHETERIZATION N/A 03/28/2015   Procedure: Left Heart Cath and Coronary Angiography;  Surgeon: Knox Perl, MD;  Location[facility] INVASIVE CV LAB;  Service: Cardiovascular;  Laterality: N/A;   CARDIAC CATHETERIZATION N/A 03/28/2015   Procedure: Intravascular Pressure Wire/FFR Study;  Surgeon: Knox Perl, MD;  Location: [facility] INVASIVE CV LAB;  Service: Cardiovascular;  Laterality: N/A;   CARPAL TUNNEL RELEASE Left ~ 2008   COLONOSCOPY WITH ESOPHAGOGASTRODUODENOSCOPY (EGD)     COLONOSCOPY WITH PROPOFOL  N/A 12/24/2019   Procedure: COLONOSCOPY WITH PROPOFOL ;  Surgeon: Ruby Corporal, MD;  [facility]: [facility];  Service: Endoscopy;  Laterality: N/A;  955   ELBOW FRACTURE SURGERY Left ~ 2008   ESOPHAGEAL MANOMETRY N/A 09/16/2021   Procedure: ESOPHAGEAL MANOMETRY (EM);  Surgeon: Annis Kinder, DO;  Location: [facility] ENDOSCOPY;  Service: Endoscopy;  Laterality: N/A;   ESOPHAGOGASTRODUODENOSCOPY N/A 12/26/2018   Procedure: ESOPHAGOGASTRODUODENOSCOPY (EGD);  Surgeon: Ruby Corporal, MD;  [facility];  Service: Endoscopy;  Laterality: N/A;   ESOPHAGOGASTRODUODENOSCOPY (EGD) WITH PROPOFOL  N/A 03/23/2019   Procedure: ESOPHAGOGASTRODUODENOSCOPY (EGD) WITH PROPOFOL ;  Surgeon: Ruby Corporal, MD;  [facility];  Service: Endoscopy;  Laterality: N/A;  7:30   FRACTURE SURGERY     ankle and elbow   PERIPHERAL VASCULAR BALLOON ANGIOPLASTY  06/20/2023   Procedure: PERIPHERAL  VASCULAR BALLOON ANGIOPLASTY;  Surgeon: Philipp Brawn, MD;  Location: [facility] INVASIVE CV LAB;  Service: Cardiovascular;;   PH IMPEDANCE STUDY N/A 09/16/2021   Procedure: PH IMPEDANCE STUDY;  Surgeon: Annis Kinder, DO;  Location: [facility]OSCOPY;  Service: Endoscopy;  Laterality: N/A;   PILONIDAL CYST EXCISION N/A 03/24/2017   Procedure: EXCISION CHRONIC  PILONIDAL ABSCESS;  Surgeon: Oza Blumenthal, MD;  Location: [facility] ORS;  Service: General;  Laterality: N/A;   SCROTAL EXPLORATION N/A 11/13/2020   Procedure: EXCISION OF SEBACEOUS CYSTS, SCROTUM;  Surgeon: Marco Severs, MD;  Location: [facility] ORS;  Service: Urology;  Laterality: N/A;   TENDON REPAIR Left ~ 2004   "main tendon in my ankle"    Social History:  reports that he has been smoking cigarettes. He has a 34 pack-year smoking history. He has never been exposed to tobacco smoke. He has never used smokeless tobacco. He reports that  he does not drink alcohol and does not use drugs. Family History:  Family History  Problem Relation Age of Onset   Diabetes Mother    Hypertension Mother    Cancer Mother    Throat cancer Mother    Diabetes Father    Hypertension Father    Hyperlipidemia Father    Congestive Heart Failure Sister    Colon cancer Neg Hx    Pancreatic cancer Neg Hx    Liver disease Neg Hx      HOME MEDICATIONS: Allergies as of 10/27/2023       Reactions   Prilosec Otc [omeprazole Magnesium ] Swelling   Face swells, no breathing impairment   Bee Venom Swelling   Morphine     Itching at IV site,  N+V   Nexium [esomeprazole] Swelling   Face swells, no breathing impairment        Medication List        Accurate as of Oct 27, 2023  9:42 AM. If you have any questions, ask your nurse or doctor.          Accu-Chek FastClix Lancets Misc Check blood sugar 3 times daily   Accu-Chek Guide w/Device Kit Use to check blood sugar 3 times daily   acetaminophen  500 MG tablet Commonly known as: TYLENOL  Take 1,000  mg by mouth 2 (two) times daily as needed for moderate pain, fever or headache.   albuterol  108 (90 Base) MCG/ACT inhaler Commonly known as: VENTOLIN  HFA Inhale 2 puffs into the lungs every 6 (six) hours as needed for wheezing or shortness of breath.   ALPRAZolam  1 MG tablet Commonly known as: XANAX  Take 1 mg by mouth at bedtime as needed for anxiety or sleep.   amLODipine 5 MG tablet Commonly known as: NORVASC Take 5 mg by mouth every evening.   ARIPiprazole  10 MG tablet Commonly known as: ABILIFY  Take 10 mg by mouth in the morning.   aspirin  EC 81 MG tablet Take 81 mg by mouth daily with breakfast.   atorvastatin  80 MG tablet Commonly known as: LIPITOR  TAKE 1 Tablet BY MOUTH ONCE EVERY DAY What changed: See the new instructions.   budesonide -formoterol  80-4.5 MCG/ACT inhaler Commonly known as: Symbicort  Take 2 puffs first thing in am and then another 2 puffs about 12 hours later. What changed:  how much to take how to take this when to take this reasons to take this additional instructions   clopidogrel  75 MG tablet Commonly known as: Plavix  Take 1 tablet (75 mg total) by mouth daily.   doxycycline  100 MG tablet Commonly known as: VIBRA -TABS TAKE 1 TABLET BY MOUTH TWICE A DAY   ezetimibe  10 MG tablet Commonly known as: ZETIA  Take 1 tablet (10 mg total) by mouth daily. What changed: when to take this   famotidine  40 MG tablet Commonly known as: PEPCID  Take 1 tablet (40 mg total) by mouth 2 (two) times daily. What changed:  when to take this reasons to take this   FreeStyle Libre 3 Plus Sensor Misc 1 Device by Other route every 14 (fourteen) days. Change sensor every 15 days.   FreeStyle Libre 2 Sensor Misc Change sensors every 14 days   FreeStyle Libre 2 Plus Sensor Misc 1 Device by Does not apply route every 14 (fourteen) days.   gabapentin  300 MG capsule Commonly known as: Neurontin  Take 1 capsule (300 mg total) by mouth every 8 (eight) hours as  needed.   HYDROcodone -acetaminophen  5-325 MG tablet Commonly known as: NORCO/VICODIN Take  1 tablet by mouth every 6 (six) hours as needed.   Insulin  Pen Needle 32G X 4 MM Misc 1 Device by Does not apply route in the morning, at noon, in the evening, and at bedtime.   Lantus  SoloStar 100 UNIT/ML Solostar Pen Generic drug: insulin  glargine Inject 14 Units into the skin daily.   linaclotide  145 MCG Caps capsule Commonly known as: Linzess  Take 1 capsule (145 mcg total) by mouth daily before breakfast.   metFORMIN  500 MG 24 hr tablet Commonly known as: GLUCOPHAGE -XR Take 1 tablet (500 mg total) by mouth 2 (two) times daily with a meal.   midodrine  10 MG tablet Commonly known as: PROAMATINE  Take 1 tablet (10 mg total) by mouth 3 (three) times daily.   mupirocin  ointment 2 % Commonly known as: BACTROBAN  Apply topically 2 (two) times daily.   nitroGLYCERIN  0.4 MG SL tablet Commonly known as: NITROSTAT  Place 1 tablet (0.4 mg total) under the tongue every 5 (five) minutes as needed for chest pain.   nitroGLYCERIN  0.2 mg/hr patch Commonly known as: NITRODUR - Dosed in mg/24 hr Place 1 patch (0.2 mg total) onto the skin daily.   NovoLOG  FlexPen 100 UNIT/ML FlexPen Generic drug: insulin  aspart Start CF: NovoLog  (BG-130/45) TIDQAC  Max daily 30 units What changed:  how much to take how to take this when to take this   Omnipod 5 Libre2 Plus G6 Kit 1 Device by Does not apply route every 3 (three) days.   Omnipod 5 Libre2 Plus G6 Pods Misc 1 DEVICE BY DOES NOT APPLY ROUTE EVERY 3 (THREE) DAYS.   polyethylene glycol 17 g packet Commonly known as: MIRALAX  / GLYCOLAX  Take 17 g by mouth daily.   prochlorperazine  10 MG tablet Commonly known as: COMPAZINE  Take 1 tablet (10 mg total) by mouth every 6 (six) hours as needed for nausea or vomiting.   promethazine  25 MG tablet Commonly known as: PHENERGAN  Take 25 mg by mouth daily as needed for nausea or vomiting.   sertraline  100  MG tablet Commonly known as: ZOLOFT  Take 2 tablets (200 mg total) by mouth at bedtime.   traZODone 50 MG tablet Commonly known as: DESYREL Take 50 mg by mouth at bedtime.   True Metrix Blood Glucose Test test strip Generic drug: glucose blood Check blood sugar 3 times daily   Accu-Chek Guide test strip Generic drug: glucose blood Check blood sugar 3 times daily         ALLERGIES: Allergies  Allergen Reactions   Prilosec Otc [Omeprazole Magnesium ] Swelling    Face swells, no breathing impairment   Bee Venom Swelling   Morphine      Itching at IV site,  N+V   Nexium [Esomeprazole] Swelling    Face swells, no breathing impairment     REVIEW OF SYSTEMS: A comprehensive ROS was conducted with the patient and is negative except as per HPI     OBJECTIVE:   VITAL SIGNS: There were no vitals taken for this visit.   PHYSICAL EXAM:  General: Pt appears well and is in NAD  Lungs: Clear with good BS bilat   Heart: RRR   Abdomen:  soft, but tender   Extremities:  Lower extremities - No pretibial edema.  Left 4th and 5th toe amputations were noted  Neuro: MS is good with appropriate affect, pt is alert and Ox3    DATA REVIEWED:  Lab Results  Component Value Date   HGBA1C 8.7 (A) 07/12/2023   HGBA1C 9.0 (A) 04/11/2023  HGBA1C 13.2 (H) 08/29/2022    Latest Reference Range & Units 07/20/23 07:58  Sodium 135 - 145 mmol/L 133 (L)  Potassium 3.5 - 5.1 mmol/L 4.3  Chloride 98 - 111 mmol/L 99  CO2 22 - 32 mmol/L 24  Glucose 70 - 99 mg/dL 161 (H)  BUN 6 - 20 mg/dL 26 (H)  Creatinine 0.96 - 1.24 mg/dL 0.45 (H)  Calcium  8.9 - 10.3 mg/dL 8.1 (L)  Anion gap 5 - 15  10  GFR, Estimated >60 mL/min 36 (L)     ASSESSMENT / PLAN / RECOMMENDATIONS:   1) Type 2 Diabetes Mellitus, Optimally  controlled, With CKD III, neuropathic and macrovascular complications - Most recent A1c of 7.3%. Goal A1c < 7.0 %.    -A1c has trended down from 8.7% to 7.3% -Forms for diabetic shoes  were signed - Patient with history of DKA, not a candidate for SGLT2 inhibitors -Patient would like to avoid further weight loss, so will avoid GLP-1 agonist at this time -Metformin  was discontinued due to low GFR -Insulin  pump was not covered by his insurance - Patient has been noted with postprandial hyperglycemia as well as hypoglycemia, I will decrease Lantus  as below - Patient tends to forget to take NovoLog  before meals at times, he ends up taking it postmeal which results in hypoglycemia - I will also adjust his sensitivity factor from 45 to 50  MEDICATIONS: Decrease Lantus  12 units once daily Increase NovoLog  6 units 3 times daily before every meal Change CF: NovoLog  (BG-130/50) TIDQAC   EDUCATION / INSTRUCTIONS: BG monitoring instructions: Patient is instructed to check his blood sugars 3 times a day. Call [place] [facility] if: BG persistently < 70  I reviewed the Rule of 15 for the treatment of hypoglycemia in detail with the patient. Literature supplied.   2) Diabetic complications:  Eye: Does not have known diabetic retinopathy.  Neuro/ Feet: Does  have known diabetic peripheral neuropathy. Renal: Patient does not have known baseline CKD. He is not on an ACEI/ARB at present.    Follow-up in 4months  Signed electronically by: Natale Bail, MD  [facility] Endocrinology  [facility] [address] [place], [place] [address] Phone: [phone] FAX: [phone]   CC: Patient, No Pcp Per No address on file Phone: None  Fax: None    Return to Endocrinology clinic as below: Future Appointments  Date Time Provider Department Center  10/27/2023 10:10 AM Akshaya Toepfer, Julian Obey, MD [facility] None  10/28/2023  9:30 AM [facility] H&V  10/28/2023 10:00 AM [facility] H&V  10/28/2023 11:00 AM [facility] PA [facility] H&V  11/14/2023 11:15 AM Lind Repine, MD [facility]  11/14/2023  2:20 PM Gerard Knight, MD [facility]  12/29/2023 10:15 AM Timothy Ford, MD [facility] None  01/17/2024  1:30 PM [facility] None  01/17/2024  2:30 PM Sonnie Dusky, PA-C [facility] None

## 2023-10-27 NOTE — Patient Instructions (Addendum)
 Decrease Lantus  12 units once daily Take Novolog  6 units with each meal PLUS extra if needed based on the scale below  Novolog  correctional insulin : Use the scale below to help guide you   Blood sugar before meal Number of units to inject  Less than 180 0 unit  181 - 230 1 units  231 - 280 2 units  281 - 330 3 units  331 - 380 4 units  381 - 430 5 units  431 - 480 6 units  481 - 530 7 units   HOW TO TREAT LOW BLOOD SUGARS (Blood sugar LESS THAN 70 MG/DL) Please follow the RULE OF 15 for the treatment of hypoglycemia treatment (when your (blood sugars are less than 70 mg/dL)   STEP 1: Take 15 grams of carbohydrates when your blood sugar is low, which includes:  3-4 GLUCOSE TABS  OR 3-4 OZ OF JUICE OR REGULAR SODA OR ONE TUBE OF GLUCOSE GEL    STEP 2: RECHECK blood sugar in 15 MINUTES STEP 3: If your blood sugar is still low at the 15 minute recheck --> then, go back to STEP 1 and treat AGAIN with another 15 grams of carbohydrates.

## 2023-10-27 NOTE — Progress Notes (Unsigned)
 HISTORY AND PHYSICAL     CC:  follow up. Requesting Provider:  No ref. provider found  HPI: This is a 53 y.o. male who is here today for follow up for PAD.  Pt has hx of angiogram LLE with drug-coated balloon angioplasty of the left popliteal artery by Dr. Susi Eric on 06/28/2023.  This was done for critical limb ischemia with left fifth toe gangrene.  He subsequently underwent left fifth toe amputation on 06/24/2023 by Dr. Julio Ohm.  His amputation site got infected and he required revision with amputation of his left fourth and fifth toes on 07/20/2023.   Pt was last seen 07/29/2023 and at that time, he did have a greater than 75% stenosis in the proximal popliteal artery but he had a palpable left DP pulse and his amputation site was healing.  Dr. Susi Eric explained to the patient that this stenosis may be due to inflammation after revascularization.  He discussed with the pt that if his amputation site did not heal, he may need further intervention.  He was scheduled for short follow up in 3 months.   The pt returns today for follow up.  ***  The pt is on a statin for cholesterol management.    The pt is on an aspirin .    Other AC:  Plavix  The pt is on CCB for hypertension.  The pt is  on medication for diabetes. Tobacco hx:  current  Pt does *** have family hx of AAA.  Past Medical History:  Diagnosis Date   Anxiety    Arthritis    Asthma    CAD (coronary artery disease)    a. 03/2015 Cath: LM nl, LAD mild diff dzs throughout w/ 50p/d, LCX diff dzs, 9m, RCA diff dzs, 33m-->Med rx; b. 12/2020 MV: EF 57%, small/mild apical inferior defect w/ partial rev/mild ischemia.   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, bilateral    Sleep apnea 2016   does not use CPAP   Type 2 diabetes mellitus (HCC)     Past Surgical History:  Procedure Laterality Date   ABDOMINAL AORTOGRAM W/LOWER EXTREMITY N/A 06/20/2023   Procedure: ABDOMINAL AORTOGRAM W/LOWER EXTREMITY;  Surgeon: Philipp Brawn, MD;  Location: [facility];  Service: Cardiovascular;  Laterality: N/A;   AMPUTATION Left 06/24/2023   Procedure: LEFT 5TH TOE AMPUTATION;  Surgeon: Timothy Ford, MD;  Location: [facility] OR;  Service: Orthopedics;  Laterality: Left;   AMPUTATION Left 07/20/2023   Procedure: LEFT FOOT 4TH AND 5TH RAY AMPUTATION;  Surgeon: Timothy Ford, MD;  Location: [facility] OR;  Service: Orthopedics;  Laterality: Left;   ANKLE SURGERY Right 1982   "had extra bones in there; took them out"   APPENDECTOMY  1975   BIOPSY  12/26/2018   Procedure: BIOPSY;  Surgeon: Ruby Corporal, MD;  Location: [facility];  Service: Endoscopy;;  duodenal biopsies   BIOPSY  03/23/2019   Procedure: BIOPSY;  Surgeon: Ruby Corporal, MD;  Location: [facility];  Service: Endoscopy;;  esophagus   CARDIAC CATHETERIZATION N/A  03/28/2015   Procedure: Left Heart Cath and Coronary Angiography;  Surgeon: Knox Perl, MD;  Location: [facility] CV [facility];  Service: Cardiovascular;  Laterality: N/A;   CARDIAC CATHETERIZATION N/A 03/28/2015   Procedure: Intravascular Pressure Wire/FFR Study;  Surgeon: Knox Perl, MD;  Location: [facility] IN[facility] CV LAB;  Service: Cardiovascular;  Laterality: N/A;   CARPAL TUNNEL RELEASE Left ~ 2008   COLONOSCOPY WITH ESOPHAGOGASTRODUODENOSCOPY (EGD)     COLONOSCOPY WITH PROPOFOL  N/A 12/24/2019   Procedure: COLONOSCOPY WITH PROPOFOL ;  Surgeon: Ruby Corporal, MD;  Location: [facility];  Service: Endoscopy;  Laterality: N/A;  955   ELBOW FRACTURE SURGERY Left ~ 2008   ESOPHAGEAL MANOMETRY N/A 09/16/2021   Procedure: ESOPHAGEAL MANOMETRY (EM);  Surgeon: Annis Kinder,  DO;  Location: [facility];  Service: Endoscopy;  Laterality: N/A;   ESOPHAGOGASTRODUODENOSCOPY N/A 12/26/2018   Procedure: ESOPHAGOGASTRODUODENOSCOPY (EGD);  Surgeon: Ruby Corporal, MD;  Location: [facility];  Service: Endoscopy;  Laterality: N/A;   ESOPHAGOGASTRODUODENOSCOPY (EGD) WITH PROPOFOL  N/A 03/23/2019   Procedure: ESOPHAGOGASTRODUODENOSCOPY (EGD) WITH PROPOFOL ;  Surgeon: Ruby Corporal, MD;  Location: [facility];  Service: Endoscopy;  Laterality: N/A;  7:30   FRACTURE SURGERY     ankle and elbow   PERIPHERAL VASCULAR BALLOON ANGIOPLASTY  06/20/2023   Procedure: PERIPHERAL VASCULAR BALLOON ANGIOPLASTY;  Surgeon: Philipp Brawn, MD;  Location: [facility];  Service: Cardiovascular;;   PH IMPEDANCE STUDY N/A 09/16/2021   Procedure: PH IMPEDANCE STUDY;  Surgeon: Annis Kinder, DO;  Location: [facility];  Service: Endoscopy;  Laterality: N/A;   PILONIDAL CYST EXCISION N/A 03/24/2017   Procedure: EXCISION CHRONIC  PILONIDAL ABSCESS;  Surgeon: Oza Blumenthal, MD;  Location: [facility];  Service: General;  Laterality: N/A;   SCROTAL EXPLORATION N/A 11/13/2020   Procedure: EXCISION OF SEBACEOUS CYSTS, SCROTUM;  Surgeon: Marco Severs, MD;  Location: [facility];  Service: Urology;  Laterality: N/A;   TENDON REPAIR Left ~ 2004   "main tendon in my ankle"    Allergies  Allergen Reactions   Prilosec Otc [Omeprazole Magnesium ] Swelling    Face swells, no breathing impairment   Bee Venom Swelling   Morphine      Itching at IV site,  N+V   Nexium [Esomeprazole] Swelling    Face swells, no breathing impairment    Current Outpatient Medications  Medication Sig Dispense Refill   Accu-Chek FastClix Lancets MISC Check blood sugar 3 times daily 100 each 11   acetaminophen  (TYLENOL ) 500 MG tablet Take 1,000 mg by mouth 2 (two) times daily as needed for moderate pain, fever or headache.     albuterol  (VENTOLIN  HFA) 108 (90 Base) MCG/ACT inhaler Inhale 2 puffs into the  lungs every 6 (six) hours as needed for wheezing or shortness of breath.     ALPRAZolam  (XANAX ) 1 MG tablet Take 1 mg by mouth at bedtime as needed for anxiety or sleep.     amLODipine (NORVASC) 5 MG tablet Take 5 mg by mouth every evening.     ARIPiprazole  (ABILIFY ) 10 MG tablet Take 10 mg by mouth in the morning.     aspirin  EC 81 MG tablet Take 81 mg by mouth daily with breakfast.     atorvastatin  (LIPITOR ) 80 MG tablet TAKE 1 Tablet BY MOUTH ONCE EVERY DAY (Patient taking differently: Take 80 mg by mouth in the morning.) 90 tablet 0   Blood Glucose Monitoring Suppl (ACCU-CHEK GUIDE) w/Device KIT Use to check blood sugar 3  times daily 1 kit 0   budesonide -formoterol  (SYMBICORT ) 80-4.5 MCG/ACT inhaler Take 2 puffs first thing in am and then another 2 puffs about 12 hours later. (Patient taking differently: Inhale 2 puffs into the lungs 2 (two) times daily as needed (wheeze, shortness of breath).) 1 each 12   clopidogrel  (PLAVIX ) 75 MG tablet Take 1 tablet (75 mg total) by mouth daily. 30 tablet 11   Continuous Glucose Sensor (FREESTYLE LIBRE 2 PLUS SENSOR) MISC 1 Device by Does not apply route every 14 (fourteen) days. 6 each 3   Continuous Glucose Sensor (FREESTYLE LIBRE 2 SENSOR) MISC Change sensors every 14 days 6 each 3   Continuous Glucose Sensor (FREESTYLE LIBRE 3 PLUS SENSOR) MISC 1 Device by Other route every 14 (fourteen) days. Change sensor every 15 days. 6 each 3   doxycycline  (VIBRA -TABS) 100 MG tablet TAKE 1 TABLET BY MOUTH TWICE A DAY 20 tablet 0   ezetimibe  (ZETIA ) 10 MG tablet Take 1 tablet (10 mg total) by mouth daily. (Patient taking differently: Take 10 mg by mouth every evening.) 90 tablet 3   famotidine  (PEPCID ) 40 MG tablet Take 1 tablet (40 mg total) by mouth 2 (two) times daily. (Patient taking differently: Take 40 mg by mouth 2 (two) times daily as needed for indigestion or heartburn.) 60 tablet 3   gabapentin  (NEURONTIN ) 300 MG capsule Take 1 capsule (300 mg total) by mouth  every 8 (eight) hours as needed. 30 capsule 0   glucose blood (ACCU-CHEK GUIDE) test strip Check blood sugar 3 times daily 100 each 12   glucose blood (TRUE METRIX BLOOD GLUCOSE TEST) test strip Check blood sugar 3 times daily 100 each 12   HYDROcodone -acetaminophen  (NORCO/VICODIN) 5-325 MG tablet Take 1 tablet by mouth every 6 (six) hours as needed. 30 tablet 0   insulin  aspart (NOVOLOG  FLEXPEN) 100 UNIT/ML FlexPen Start CF: NovoLog  (BG-130/45) TIDQAC  Max daily 30 units (Patient taking differently: Inject 0-7 Units into the skin 3 (three) times daily before meals. Start CF: NovoLog  (BG-130/45) TIDQAC  Max daily 30 units) 30 mL 3   insulin  glargine (LANTUS  SOLOSTAR) 100 UNIT/ML Solostar Pen Inject 12 Units into the skin daily. 15 mL 11   Insulin  Pen Needle 32G X 4 MM MISC 1 Device by Does not apply route in the morning, at noon, in the evening, and at bedtime. 400 each 3   linaclotide  (LINZESS ) 145 MCG CAPS capsule Take 1 capsule (145 mcg total) by mouth daily before breakfast. 90 capsule 3   midodrine  (PROAMATINE ) 10 MG tablet Take 1 tablet (10 mg total) by mouth 3 (three) times daily. 270 tablet 3   mupirocin  ointment (BACTROBAN ) 2 % Apply topically 2 (two) times daily.     nitroGLYCERIN  (NITRODUR - DOSED IN MG/24 HR) 0.2 mg/hr patch Place 1 patch (0.2 mg total) onto the skin daily. 30 patch 12   nitroGLYCERIN  (NITROSTAT ) 0.4 MG SL tablet Place 1 tablet (0.4 mg total) under the tongue every 5 (five) minutes as needed for chest pain. 25 tablet 3   polyethylene glycol (MIRALAX  / GLYCOLAX ) 17 g packet Take 17 g by mouth daily. 14 each 0   prochlorperazine  (COMPAZINE ) 10 MG tablet Take 1 tablet (10 mg total) by mouth every 6 (six) hours as needed for nausea or vomiting. 20 tablet 0   promethazine  (PHENERGAN ) 25 MG tablet Take 25 mg by mouth daily as needed for nausea or vomiting.     sertraline  (ZOLOFT ) 100 MG tablet Take 2 tablets (200  mg total) by mouth at bedtime.     traZODone (DESYREL) 50 MG  tablet Take 50 mg by mouth at bedtime.     No current facility-administered medications for this visit.    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  Other Topics Concern   Not on file  Social History Narrative   His main caretaker, his mother has terminal metastatic cancer.   Social Drivers of Health   Financial Resource Strain: High Risk (06/24/2022)   Overall Financial Resource Strain (CARDIA)    Difficulty of Paying Living Expenses: Very hard  Food Insecurity: Food Insecurity Present (06/18/2022)   Hunger Vital Sign    Worried About Running Out of Food in the Last Year: Often true    Ran Out of Food in the Last Year: Often true  Transportation Needs: No Transportation Needs (06/18/2022)   PRAPARE - Administrator, Civil Service (Medical): No    Lack of Transportation (Non-Medical): No  Physical Activity: Not on file  Stress: Not on file  Social Connections:  Unknown (05/25/2022)   [facility]   Social Network    Social Network: Not on file  Intimate Partner Violence: Not At Risk (06/18/2022)   Humiliation, Afraid, Rape, and Kick questionnaire    Fear of Current or Ex-Partner: No    Emotionally Abused: No    Physically Abused: No    Sexually Abused: No     REVIEW OF SYSTEMS:  *** [X]  denotes positive finding, [ ]  denotes negative finding Cardiac  Comments:  Chest pain or chest pressure:    Shortness of breath upon exertion:    Short of breath when lying flat:    Irregular heart rhythm:        Vascular    Pain in calf, thigh, or hip brought on by ambulation:    Pain in feet at night that wakes you up from your sleep:     Blood clot in your veins:    Leg swelling:         Pulmonary    Oxygen  at home:    Productive cough:     Wheezing:  Neurologic    Sudden weakness in arms or legs:     Sudden numbness in arms or legs:     Sudden onset of difficulty speaking or slurred speech:    Temporary loss of vision in one eye:     Problems with dizziness:         Gastrointestinal    Blood in stool:     Vomited blood:         Genitourinary    Burning when urinating:     Blood in urine:        Psychiatric    Major depression:         Hematologic    Bleeding problems:    Problems with blood clotting too easily:        Skin    Rashes or ulcers:        Constitutional    Fever or chills:      PHYSICAL EXAMINATION:  ***  General:  WDWN in NAD; vital signs documented above Gait: Not observed HENT: WNL, normocephalic Pulmonary: normal non-labored breathing , without wheezing Cardiac: {Desc; regular/irreg:14544} HR, {With/Without:20273} carotid bruit*** Abdomen: soft, NT; aortic pulse is *** palpable Skin: {With/Without:20273} rashes Vascular Exam/Pulses:  Right Left  Radial {Exam; arterial pulse strength 0-4:30167} {Exam; arterial pulse strength 0-4:30167}  Femoral {Exam; arterial  pulse strength 0-4:30167} {Exam; arterial pulse strength 0-4:30167}  Popliteal {Exam; arterial pulse strength 0-4:30167} {Exam; arterial pulse strength 0-4:30167}  DP {Exam; arterial pulse strength 0-4:30167} {Exam; arterial pulse strength 0-4:30167}  PT {Exam; arterial pulse strength 0-4:30167} {Exam; arterial pulse strength 0-4:30167}  Peroneal *** ***   Extremities: {With/Without:20273} ischemic changes, {With/Without:20273} Gangrene , {With/Without:20273} cellulitis; {With/Without:20273} open wounds Musculoskeletal: no muscle wasting or atrophy  Neurologic: A&O X 3 Psychiatric:  The pt has {Desc; normal/abnormal:11317::"Normal"} affect.   Non-Invasive Vascular Imaging:   ABI's/TBI's on 10/28/2023: Right:  *** - Great toe pressure: *** Left:  *** - Great toe pressure: ***  Arterial duplex on 10/28/2023: ***  Previous ABI's/TBI's on 07/29/2023: Right:  0.99/0.65 - Great toe pressure: 111 Left:  0.91/0.65 - Great toe pressure:  111  Previous arterial duplex on 07/29/2023: +-----------+--------+-----+---------------+----------+-----------------+  LEFT      PSV cm/sRatioStenosis       Waveform  Comments           +-----------+--------+-----+---------------+----------+-----------------+  CFA Prox   172                         triphasic                    +-----------+--------+-----+---------------+----------+-----------------+  CFA Distal 142                         triphasic                    +-----------+--------+-----+---------------+----------+-----------------+  DFA       194          30-49% stenosistriphasic homogenous plaque  +-----------+--------+-----+---------------+----------+-----------------+  SFA Prox   272          50-74% stenosismonophasichomogenous plaque  +-----------+--------+-----+---------------+----------+-----------------+  SFA Mid    161                         monophasic                    +-----------+--------+-----+---------------+----------+-----------------+  SFA Distal 187  30-49% stenosismonophasic                   +-----------+--------+-----+---------------+----------+-----------------+  POP Prox   463          75-99% stenosismonophasic                   +-----------+--------+-----+---------------+----------+-----------------+  POP Mid    270          50-74% stenosisbiphasic                     +-----------+--------+-----+---------------+----------+-----------------+  POP Distal 288          50-74% stenosismonophasic                   +-----------+--------+-----+---------------+----------+-----------------+  ATA Distal 88                          monophasic                   +-----------+--------+-----+---------------+----------+-----------------+  PTA Distal 60                          monophasic                   +-----------+--------+-----+---------------+----------+-----------------+  PERO Distal37                          monophasicdampened           +-----------+--------+-----+---------------+----------+-----------------+   Summary:  Left: 30-49% stenosis noted in the deep femoral artery. 50-74% stenosis noted in the superficial femoral artery. 75-99% stenosis noted in the popliteal artery.     ASSESSMENT/PLAN:: 53 y.o. male here for follow up for PAD with hx of angiogram LLE with drug-coated balloon angioplasty of the left popliteal artery by Dr. Susi Eric on 06/28/2023.  This was done for critical limb ischemia with left fifth toe gangrene.  He subsequently underwent left fifth toe amputation on 06/24/2023 by Dr. Julio Ohm.  His amputation site got infected and he required revision with amputation of his left fourth and fifth toes on 07/20/2023.    -*** -continue asa/statin/plavix  -pt will f/u in *** with ***.   Maryanna Smart, [facility] [phone]  Clinic MD:   Susi Eric

## 2023-10-28 ENCOUNTER — Encounter: Payer: Self-pay | Admitting: Physician Assistant

## 2023-10-28 ENCOUNTER — Ambulatory Visit: Payer: MEDICAID

## 2023-10-28 ENCOUNTER — Ambulatory Visit (HOSPITAL_COMMUNITY)
Admission: RE | Admit: 2023-10-28 | Discharge: 2023-10-28 | Disposition: A | Discharge status: Home / Self Care | Payer: MEDICAID | Source: Ambulatory Visit | Attending: Physician Assistant | Admitting: Physician Assistant

## 2023-10-28 VITALS — BP 162/89 | HR 93 | Temp 98.0°F | Ht 68.0 in | Wt 153.0 lb

## 2023-10-28 DIAGNOSIS — I70222 Atherosclerosis of native arteries of extremities with rest pain, left leg: Secondary | ICD-10-CM

## 2023-10-28 DIAGNOSIS — F17200 Nicotine dependence, unspecified, uncomplicated: Secondary | ICD-10-CM | POA: Diagnosis not present

## 2023-10-28 LAB — VAS US ABI WITH/WO TBI
Left ABI: 1.07
Right ABI: 1.06

## 2023-10-31 ENCOUNTER — Other Ambulatory Visit: Payer: Self-pay | Admitting: *Deleted

## 2023-10-31 DIAGNOSIS — I70222 Atherosclerosis of native arteries of extremities with rest pain, left leg: Secondary | ICD-10-CM

## 2023-11-01 ENCOUNTER — Encounter: Payer: Self-pay | Admitting: Internal Medicine

## 2023-11-01 ENCOUNTER — Ambulatory Visit (INDEPENDENT_AMBULATORY_CARE_PROVIDER_SITE_OTHER): Payer: MEDICAID | Admitting: Internal Medicine

## 2023-11-01 VITALS — BP 118/72 | HR 100 | Ht 68.0 in | Wt 153.0 lb

## 2023-11-01 DIAGNOSIS — E1142 Type 2 diabetes mellitus with diabetic polyneuropathy: Secondary | ICD-10-CM

## 2023-11-01 DIAGNOSIS — E1165 Type 2 diabetes mellitus with hyperglycemia: Secondary | ICD-10-CM

## 2023-11-01 DIAGNOSIS — E1159 Type 2 diabetes mellitus with other circulatory complications: Secondary | ICD-10-CM | POA: Diagnosis not present

## 2023-11-01 DIAGNOSIS — Z794 Long term (current) use of insulin: Secondary | ICD-10-CM

## 2023-11-01 NOTE — Progress Notes (Signed)
 Name: [name] [name]  MRN/ DOB: [id number], [birth year]   Age/ Sex: 53 y.o., male    PCP: Victor Grapes, PA-C   Reason for Endocrinology Evaluation: Type 2 Diabetes Mellitus     Date of Initial Endocrinology Visit: 04/11/2023    PATIENT IDENTIFIER: [name] [name] is a 53 y.o. male with a past medical history of DM, HTN, CAD, COPD, OSA. The patient presented for initial endocrinology clinic visit on 04/11/2023 for consultative assistance with his diabetes management.    HPI: [name] [name]    Diagnosed with DM 2017                Hemoglobin A1c has ranged from 7.8% in 2017, peaking at 15.5% in 2022.  Patient with history of DKA   On his initial visit to our clinic he had an A1c of 9.0%, patient was on NovoLog  Mix, metformin , and I switched him to Lantus /NovoLog   Metformin  discontinued during hospitalization 06/2023  I had prescribed OmniPod 07/2023 and referred him to our CDE for training, but per patient this would not be covered by his insurance  SUBJECTIVE:   During the last visit (10/27/2023): A1c 8.7%  Today (11/01/23): [name] [name] is here for follow-up on diabetes management and require an updated foot exam to obtain diabetic shoes.  He checks his blood sugars both times daily. The patient has not had hypoglycemic episodes since the last clinic visit.    Patient continues to follow-up with Dr. Julio Ohm for history of left fifth toe amputation that occurred 06/2023, he is s/p left fourth toe amputation 07/2023  Historically he has had chronic constipation, no other GI symptoms   HOME DIABETES REGIMEN: Lantus  12 units daily Novolog  6 units TIDQAC CF: NovoLog  (BG -130/45) TIDQAC   Statin: yes ACE-I/ARB: no   CONTINUOUS GLUCOSE MONITORING RECORD INTERPRETATION    Dates of Recording: 5/21-11/01/2023  Sensor description:freestyle libre 3  Results statistics:   CGM use % of time 98  Average and SD 196/35.4  Time in range 47 %  % Time Above 180 32  % Time above  250 20  % Time Below target 1   Glycemic patterns summary: BGs are optimal overnight and increased throughout the day  Hyperglycemic episodes postprandial  Hypoglycemic episodes occurred N/A in the past 2 weeks  Overnight periods:  optimal   DIABETIC COMPLICATIONS: Microvascular complications:  Neuropathy, s/p left fifth toe amputation Denies: CKD  Last eye exam: Completed 2 yrs   Macrovascular complications:  CAD Denies:  PVD, CVA   PAST HISTORY: Past Medical History:  Past Medical History:  Diagnosis Date   Anxiety    Arthritis    Asthma    CAD (coronary artery disease)    a. 03/2015 Cath: LM nl, LAD mild diff dzs throughout w/ 50p/d, LCX diff dzs, 85m, RCA diff dzs, 3m-->Med rx; b. 12/2020 MV: EF 57%, small/mild apical inferior defect w/ partial rev/mild ischemia.   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, bilateral    Sleep apnea 2016   does not use CPAP  Type 2 diabetes mellitus (HCC)    Past Surgical History:  Past Surgical History:  Procedure Laterality Date   ABDOMINAL AORTOGRAM W/LOWER EXTREMITY N/A 06/20/2023   Procedure: ABDOMINAL AORTOGRAM W/LOWER EXTREMITY;  Surgeon: Philipp Brawn, MD;  Location: [facility] INVASIVE CV LAB;  Service: Cardiovascular;  Laterality: N/A;   AMPUTATION Left 06/24/2023   Procedure: LEFT 5TH TOE AMPUTATION;  Surgeon: Timothy Ford, MD;  Location: [facility] OR;  Service: Orthopedics;  Laterality: Left;   AMPUTATION Left 07/20/2023   Procedure: LEFT FOOT 4TH AND 5TH RAY AMPUTATION;  Surgeon: Timothy Ford, MD;  Location: [facility] OR;  Service: Orthopedics;  Laterality: Left;   ANKLE SURGERY Right  1982   "had extra bones in there; took them out"   APPENDECTOMY  1975   BIOPSY  12/26/2018   Procedure: BIOPSY;  Surgeon: Ruby Corporal, MD;  [facility];  Service: Endoscopy;;  duodenal biopsies   BIOPSY  03/23/2019   Procedure: BIOPSY;  Surgeon: Ruby Corporal, MD;  Location: [facility];  Service: Endoscopy;;  esophagus   CARDIAC CATHETERIZATION N/A 03/28/2015   Procedure: Left Heart Cath and Coronary Angiography;  Surgeon: Knox Perl, MD;  Location: [facility] INVASIVE CV LAB;  Service: Cardiovascular;  Laterality: N/A;   CARDIAC CATHETERIZATION N/A 03/28/2015   Procedure: Intravascular Pressure Wire/FFR Study;  Surgeon: Knox Perl, MD;  Location: [facility] INVASIVE CV LAB;  Service: Cardiovascular;  Laterality: N/A;   CARPAL TUNNEL RELEASE Left ~ 2008   COLONOSCOPY WITH ESOPHAGOGASTRODUODENOSCOPY (EGD)     COLONOSCOPY WITH PROPOFOL  N/A 12/24/2019   Procedure: COLONOSCOPY WITH PROPOFOL ;  Surgeon: Ruby Corporal, MD;  Location: [facility];  Service: Endoscopy;  Laterality: N/A;  955   ELBOW FRACTURE SURGERY Left ~ 2008   ESOPHAGEAL MANOMETRY N/A 09/16/2021   Procedure: ESOPHAGEAL MANOMETRY (EM);  Surgeon: Annis Kinder, DO;  Location: [facility];  Service: Endoscopy;  Laterality: N/A;   ESOPHAGOGASTRODUODENOSCOPY N/A 12/26/2018   Procedure: ESOPHAGOGASTRODUODENOSCOPY (EGD);  Surgeon: Ruby Corporal, MD;  [facility];  Service: Endoscopy;  Laterality: N/A;   ESOPHAGOGASTRODUODENOSCOPY (EGD) WITH PROPOFOL  N/A 03/23/2019   Procedure: ESOPHAGOGASTRODUODENOSCOPY (EGD) WITH PROPOFOL ;  Surgeon: Ruby Corporal, MD;  [facility];  Service: Endoscopy;  Laterality: N/A;  7:30   FRACTURE SURGERY     ankle and elbow   PERIPHERAL VASCULAR BALLOON ANGIOPLASTY  06/20/2023   Procedure: PERIPHERAL VASCULAR BALLOON ANGIOPLASTY;  Surgeon: Philipp Brawn, MD;  Location: [facility] INVASIVE CV LAB;  Service: Cardiovascular;;   PH IMPEDANCE STUDY N/A 09/16/2021   Procedure: PH  IMPEDANCE STUDY;  Surgeon: Annis Kinder, DO;  Location: [facility];  Service: Endoscopy;  Laterality: N/A;   PILONIDAL CYST EXCISION N/A 03/24/2017   Procedure: EXCISION CHRONIC  PILONIDAL ABSCESS;  Surgeon: Oza Blumenthal, MD;  Location: [facility] ORS;  Service: General;  Laterality: N/A;   SCROTAL EXPLORATION N/A 11/13/2020   Procedure: EXCISION OF SEBACEOUS CYSTS, SCROTUM;  Surgeon: Marco Severs, MD;  Location: [facility];  Service: Urology;  Laterality: N/A;   TENDON REPAIR Left ~ 2004   "main tendon in my ankle"    Social History:  reports that he has been smoking cigarettes. He has a 34 pack-year smoking history. He has never been exposed to tobacco smoke. He has never used smokeless tobacco. He reports that he does not drink alcohol and does not use drugs. Family History:  Family History  Problem Relation Age of Onset   Diabetes Mother  Hypertension Mother    Cancer Mother    Throat cancer Mother    Diabetes Father    Hypertension Father    Hyperlipidemia Father    Congestive Heart Failure Sister    Colon cancer Neg Hx    Pancreatic cancer Neg Hx    Liver disease Neg Hx      HOME MEDICATIONS: Allergies as of 11/01/2023       Reactions   Prilosec Otc [omeprazole Magnesium ] Swelling   Face swells, no breathing impairment   Bee Venom Swelling   Morphine     Itching at IV site,  N+V   Nexium [esomeprazole] Swelling   Face swells, no breathing impairment        Medication List        Accurate as of November 01, 2023 12:24 PM. If you have any questions, ask your nurse or doctor.          Accu-Chek FastClix Lancets Misc Check blood sugar 3 times daily   Accu-Chek Guide w/Device Kit Use to check blood sugar 3 times daily   acetaminophen  500 MG tablet Commonly known as: TYLENOL  Take 1,000 mg by mouth 2 (two) times daily as needed for moderate pain, fever or headache.   albuterol  108 (90 Base) MCG/ACT inhaler Commonly known as: VENTOLIN  HFA Inhale 2 puffs  into the lungs every 6 (six) hours as needed for wheezing or shortness of breath.   ALPRAZolam  1 MG tablet Commonly known as: XANAX  Take 1 mg by mouth at bedtime as needed for anxiety or sleep.   amLODipine 5 MG tablet Commonly known as: NORVASC Take 5 mg by mouth every evening.   ARIPiprazole  10 MG tablet Commonly known as: ABILIFY  Take 10 mg by mouth in the morning.   aspirin  EC 81 MG tablet Take 81 mg by mouth daily with breakfast.   atorvastatin  80 MG tablet Commonly known as: LIPITOR  TAKE 1 Tablet BY MOUTH ONCE EVERY DAY What changed: See the new instructions.   budesonide -formoterol  80-4.5 MCG/ACT inhaler Commonly known as: Symbicort  Take 2 puffs first thing in am and then another 2 puffs about 12 hours later. What changed:  how much to take how to take this when to take this reasons to take this additional instructions   clopidogrel  75 MG tablet Commonly known as: Plavix  Take 1 tablet (75 mg total) by mouth daily.   doxycycline  100 MG tablet Commonly known as: VIBRA -TABS TAKE 1 TABLET BY MOUTH TWICE A DAY   ezetimibe  10 MG tablet Commonly known as: ZETIA  Take 1 tablet (10 mg total) by mouth daily. What changed: when to take this   famotidine  40 MG tablet Commonly known as: PEPCID  Take 1 tablet (40 mg total) by mouth 2 (two) times daily. What changed:  when to take this reasons to take this   FreeStyle Libre 3 Plus Sensor Misc 1 Device by Other route every 14 (fourteen) days. Change sensor every 15 days.   FreeStyle Libre 2 Sensor Misc Change sensors every 14 days   FreeStyle Libre 2 Plus Sensor Misc 1 Device by Does not apply route every 14 (fourteen) days.   gabapentin  300 MG capsule Commonly known as: Neurontin  Take 1 capsule (300 mg total) by mouth every 8 (eight) hours as needed.   HYDROcodone -acetaminophen  5-325 MG tablet Commonly known as: NORCO/VICODIN Take 1 tablet by mouth every 6 (six) hours as needed.   Insulin  Pen Needle 32G X 4  MM Misc 1 Device by Does not apply route in the  morning, at noon, in the evening, and at bedtime.   Lantus  SoloStar 100 UNIT/ML Solostar Pen Generic drug: insulin  glargine Inject 12 Units into the skin daily.   linaclotide  145 MCG Caps capsule Commonly known as: Linzess  Take 1 capsule (145 mcg total) by mouth daily before breakfast.   midodrine  10 MG tablet Commonly known as: PROAMATINE  Take 1 tablet (10 mg total) by mouth 3 (three) times daily.   mupirocin  ointment 2 % Commonly known as: BACTROBAN  Apply topically 2 (two) times daily.   nitroGLYCERIN  0.4 MG SL tablet Commonly known as: NITROSTAT  Place 1 tablet (0.4 mg total) under the tongue every 5 (five) minutes as needed for chest pain.   nitroGLYCERIN  0.2 mg/hr patch Commonly known as: NITRODUR - Dosed in mg/24 hr Place 1 patch (0.2 mg total) onto the skin daily.   NovoLOG  FlexPen 100 UNIT/ML FlexPen Generic drug: insulin  aspart Start CF: NovoLog  (BG-130/45) TIDQAC  Max daily 30 units What changed:  how much to take how to take this when to take this   polyethylene glycol 17 g packet Commonly known as: MIRALAX  / GLYCOLAX  Take 17 g by mouth daily.   prochlorperazine  10 MG tablet Commonly known as: COMPAZINE  Take 1 tablet (10 mg total) by mouth every 6 (six) hours as needed for nausea or vomiting.   promethazine  25 MG tablet Commonly known as: PHENERGAN  Take 25 mg by mouth daily as needed for nausea or vomiting.   sertraline  100 MG tablet Commonly known as: ZOLOFT  Take 2 tablets (200 mg total) by mouth at bedtime.   traZODone 50 MG tablet Commonly known as: DESYREL Take 50 mg by mouth at bedtime.   True Metrix Blood Glucose Test test strip Generic drug: glucose blood Check blood sugar 3 times daily   Accu-Chek Guide test strip Generic drug: glucose blood Check blood sugar 3 times daily         ALLERGIES: Allergies  Allergen Reactions   Prilosec Otc [Omeprazole Magnesium ] Swelling    Face  swells, no breathing impairment   Bee Venom Swelling   Morphine      Itching at IV site,  N+V   Nexium [Esomeprazole] Swelling    Face swells, no breathing impairment     REVIEW OF SYSTEMS: A comprehensive ROS was conducted with the patient and is negative except as per HPI     OBJECTIVE:   VITAL SIGNS: BP 118/72 (BP Location: Right Arm, Patient Position: Sitting, Cuff Size: Normal)   Pulse 100   Ht 5\' 8"  (1.727 m)   Wt 153 lb (69.4 kg) Comment: patient reported  SpO2 98%   BMI 23.26 kg/m    PHYSICAL EXAM:  General: Pt appears well and is in NAD  Lungs: Clear with good BS bilat   Heart: RRR   Extremities:  Lower extremities - No pretibial edema.  Neuro: MS is good with appropriate affect, pt is alert and Ox3   DM Foot exam 11/01/2023  External examination of the feet shows left 4th and 5th toe amputations The pedal pulses are 1+ on right and 1+ on left. The sensation is absent on the left and decreased on the right to a screening 5.07, 10 gram monofilament    DATA REVIEWED:  Lab Results  Component Value Date   HGBA1C 7.3 (A) 10/27/2023   HGBA1C 8.7 (A) 07/12/2023   HGBA1C 9.0 (A) 04/11/2023    Latest Reference Range & Units 07/20/23 07:58  Sodium 135 - 145 mmol/L 133 (L)  Potassium 3.5 -  5.1 mmol/L 4.3  Chloride 98 - 111 mmol/L 99  CO2 22 - 32 mmol/L 24  Glucose 70 - 99 mg/dL 098 (H)  BUN 6 - 20 mg/dL 26 (H)  Creatinine 1.19 - 1.24 mg/dL 1.47 (H)  Calcium  8.9 - 10.3 mg/dL 8.1 (L)  Anion gap 5 - 15  10  GFR, Estimated >60 mL/min 36 (L)     ASSESSMENT / PLAN / RECOMMENDATIONS:   1) Type 2 Diabetes Mellitus, Optimally  controlled, With CKD III, neuropathic and macrovascular complications - Most recent A1c of 7.3%. Goal A1c < 7.0 %.    -I have reviewed his freestyle libre since his last visit here, historically he has had hypoglycemia overnight, but he has not had any hypoglycemic episodes since his last visit 10/27/2023 -He continues with postprandial  hyperglycemia, will adjust NovoLog  as below -Forms for diabetic shoes were re-signed again - Patient with history of DKA, not a candidate for SGLT2 inhibitors -Patient would like to avoid further weight loss, so will avoid GLP-1 agonist at this time -Metformin  was discontinued due to low GFR -Insulin  pump was not covered by his insurance  MEDICATIONS: Continue Lantus  12 units once daily Increase NovoLog  8 units 3 times daily before every meal Continue CF: NovoLog  (BG-130/50) TIDQAC   EDUCATION / INSTRUCTIONS: BG monitoring instructions: Patient is instructed to check his blood sugars 3 times a day. Call [facility] if: BG persistently < 70  I reviewed the Rule of 15 for the treatment of hypoglycemia in detail with the patient. Literature supplied.   2) Diabetic complications:  Eye: Does not have known diabetic retinopathy.  Neuro/ Feet: Does  have known diabetic peripheral neuropathy. Renal: Patient does not have known baseline CKD. He is not on an ACEI/ARB at present.   Signed electronically by: Natale Bail, MD  [facility]  [facility] [address] [place], [place] [address] Phone: [phone] FAX: [phone]   CC: Marie Shone [address] [place] [address] Phone: [phone]  Fax: [phone]    Return to Endocrinology clinic as below: Future Appointments  Date Time Provider Department Center  11/14/2023 11:15 AM Lind Repine, MD [facility]  11/14/2023  2:20 PM Gerard Knight, MD [facility] [place] [facility]  12/29/2023 10:15 AM Timothy Ford, MD [facility] None  01/17/2024  1:30 PM [facility] None  01/17/2024  2:30 PM Sonnie Dusky, PA-C [facility] None  01/27/2024  2:00 PM [facility]  01/27/2024  2:30 PM [facility]  01/27/2024  3:20 PM Philipp Brawn, MD [facility] H[facility]V  02/27/2024 10:10 AM Jaque Dacy, Julian Obey, MD [facility] None

## 2023-11-01 NOTE — Patient Instructions (Signed)
 Continue Lantus  12 units once daily Take Novolog  8 units with each meal PLUS extra if needed based on the scale below  Novolog  correctional insulin : Use the scale below to help guide you   Blood sugar before meal Number of units to inject  Less than 180 0 unit  181 - 230 1 units  231 - 280 2 units  281 - 330 3 units  331 - 380 4 units  381 - 430 5 units  431 - 480 6 units  481 - 530 7 units   HOW TO TREAT LOW BLOOD SUGARS (Blood sugar LESS THAN 70 MG/DL) Please follow the RULE OF 15 for the treatment of hypoglycemia treatment (when your (blood sugars are less than 70 mg/dL)   STEP 1: Take 15 grams of carbohydrates when your blood sugar is low, which includes:  3-4 GLUCOSE TABS  OR 3-4 OZ OF JUICE OR REGULAR SODA OR ONE TUBE OF GLUCOSE GEL    STEP 2: RECHECK blood sugar in 15 MINUTES STEP 3: If your blood sugar is still low at the 15 minute recheck --> then, go back to STEP 1 and treat AGAIN with another 15 grams of carbohydrates.

## 2023-11-02 ENCOUNTER — Ambulatory Visit: Payer: MEDICAID | Admitting: Internal Medicine

## 2023-11-07 ENCOUNTER — Encounter: Payer: Self-pay | Admitting: Orthopedic Surgery

## 2023-11-07 NOTE — Progress Notes (Signed)
 Office Visit Note   Patient: [name] [name]           Date of Birth: 1970/08/19           MRN: [id number] Visit Date: 10/27/2023              Requested by: No referring provider defined for this encounter. PCP: Victor Grapes, PA-C  Chief Complaint  Patient presents with   Left Foot - Routine Post Op    07/20/23 left 4th and 5th ray amputation      HPI: Patient is a 53 year old gentleman who is over the 70-month status post left foot 4th and 5th ray amputation.  Patient is using a nitroglycerin  patch to improve microcirculation.  Antibiotic ointment to the wound.  Assessment & Plan: Visit Diagnoses:  1. 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  Patient will stop the nitroglycerin  patch a prescription was provided for [employer] for shoe spacer insert and carbon fiber plate.  Follow-Up Instructions: Return in about 2 months (around 12/27/2023).   Ortho Exam  Patient is alert, oriented, no adenopathy, well-dressed, normal affect, normal respiratory effort. Examination the ulcer has healed the 4th and 5th ray amputation has good epithelization there is no cellulitis no drainage.    Imaging: No results found. No images are attached to the encounter.  Labs: Lab Results  Component Value Date   HGBA1C 7.3 (A) 10/27/2023   HGBA1C 8.7 (A) 07/12/2023   HGBA1C 9.0 (A) 04/11/2023   ESRSEDRATE 20 (H) 06/18/2022   ESRSEDRATE 1 08/20/2015   CRP <0.5 06/18/2022   CRP 2.5 01/23/2019   CRP <0.5 08/20/2015   REPTSTATUS 06/18/2023 FINAL 06/13/2023   CULT  06/13/2023    NO GROWTH 5 DAYS Performed at [facility], [address], [place], [place] [address]      Lab Results  Component Value Date   ALBUMIN 3.2 (L) 08/27/2022   ALBUMIN 2.8 (L) 07/26/2022   ALBUMIN 4.1 07/06/2022    Lab Results  Component Value Date   MG 1.8 02/08/2023   MG 2.0 08/29/2022   MG 1.5 (L) 08/28/2022   No results found for: "VD25OH"  No results found for:  "PREALBUMIN"    Latest Ref Rng & Units 07/20/2023    7:58 AM 06/20/2023    5:32 AM 06/13/2023    6:05 PM  CBC EXTENDED  WBC 4.0 - 10.5 K/uL 14.1   13.2   RBC 4.22 - 5.81 MIL/uL 4.08   4.59   Hemoglobin 13.0 - 17.0 g/dL 19.1  47.8  29.5   HCT 39.0 - 52.0 % 37.1  40.0  43.5   Platelets 150 - 400 K/uL 297   251   NEUT# 1.7 - 7.7 K/uL   9.7   Lymph# 0.7 - 4.0 K/uL   2.4      There is no height or weight on file to calculate BMI.  Orders:  No orders of the defined types were placed in this encounter.  No orders of the defined types were placed in this encounter.    Procedures: No procedures performed  Clinical Data: No additional findings.  ROS:  All other systems negative, except as noted in the HPI. Review of Systems  Objective: Vital Signs: There were no vitals taken for this visit.  Specialty Comments:  No specialty comments available.  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) 07/12/2023   Gangrene of toe of left foot (HCC) 06/24/2023   Type 2 diabetes mellitus with diabetic polyneuropathy, with long-term current use of insulin  (HCC) 04/11/2023   Type 2 diabetes mellitus with hyperglycemia, with long-term current use of insulin  (HCC) 04/11/2023   Diabetes mellitus (HCC) 04/11/2023   PTSD (post-traumatic stress disorder) 03/14/2023   Aortic valve sclerosis 09/16/2022   Hypoglycemia 08/27/2022   COPD GOLD 1/ AB 06/11/2022   Syncope and collapse 12/13/2021   Bifascicular block 12/13/2021   SIRS (systemic inflammatory response syndrome) (HCC) 12/13/2021   Renal insufficiency 12/13/2021   Esophageal motility disorder 12/13/2021   Heartburn    Scrotal abscess 11/19/2020   Severe sepsis (HCC) 09/03/2020   Non-adherence to medical treatment 05/27/2020   Barrett's esophagus 01/23/2019   Diarrhea 01/23/2019   MDD (major depressive disorder), severe (HCC) 12/17/2018   DKA, type 2 (HCC) 11/07/2018    High anion gap metabolic acidosis 03/22/2018   Epigastric abdominal pain 03/22/2018   Nausea vomiting and diarrhea 03/22/2018   AKI (acute kidney injury) (HCC) 03/22/2018   Uncontrolled type 2 diabetes mellitus with hyperglycemia (HCC) 03/22/2018   ARF (acute renal failure) (HCC) 05/30/2017   Mixed hyperlipidemia 03/31/2017   Diabetic polyneuropathy (HCC) 03/31/2017   Vitamin B12 deficiency 03/31/2017   Vitamin D  deficiency 03/31/2017   Right bundle branch block 02/14/2017   DKA (diabetic ketoacidosis) (HCC) 10/15/2016   Gastroesophageal reflux disease 10/15/2016   Benign hypertension 10/15/2016   Leukocytosis 11/25/2015   Intractable nausea and vomiting 11/25/2015   Uncontrolled type 2 diabetes mellitus with hypoglycemia, with long-term current use of insulin  (HCC) 11/25/2015   Dental abscess 11/25/2015   Nausea and vomiting 11/25/2015   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 03/2015 Cath: LM nl, LAD mild diff dzs throughout w/ 50p/d, LCX diff dzs, 82m, RCA diff dzs, 66m-->Med rx; b. 12/2020 MV: EF 57%, small/mild apical inferior defect w/ partial rev/mild ischemia.   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, bilateral    Sleep apnea 2016   does not use CPAP   Type 2 diabetes mellitus (HCC)      Family History  Problem Relation Age of Onset   Diabetes Mother    Hypertension Mother    Cancer Mother    Throat cancer Mother    Diabetes Father    Hypertension Father    Hyperlipidemia Father    Congestive Heart Failure Sister    Colon cancer Neg Hx    Pancreatic cancer Neg Hx    Liver disease Neg Hx     Past Surgical History:  Procedure Laterality Date   ABDOMINAL AORTOGRAM W/LOWER EXTREMITY N/A 06/20/2023  Procedure: ABDOMINAL AORTOGRAM W/LOWER EXTREMITY;  Surgeon: Philipp Brawn, MD;  Location: [facility] INVASIVE CV LAB;  Service: Cardiovascular;  Laterality: N/A;   AMPUTATION Left 06/24/2023   Procedure: LEFT 5TH TOE AMPUTATION;  Surgeon: Timothy Ford, MD;  Location: [facility] OR;  Service: Orthopedics;  Laterality: Left;   AMPUTATION Left 07/20/2023   Procedure: LEFT FOOT 4TH AND 5TH RAY AMPUTATION;  Surgeon: Timothy Ford, MD;  Location: [facility] OR;  Service: Orthopedics;  Laterality: Left;   ANKLE SURGERY Right 1982   "had extra bones in there; took them out"   APPENDECTOMY  1975   BIOPSY  12/26/2018   Procedure: BIOPSY;  Surgeon: Ruby Corporal, MD;  Location: [facility];  Service: Endoscopy;;  duodenal biopsies   BIOPSY  03/23/2019   Procedure: BIOPSY;  Surgeon: Ruby Corporal, MD;  Location: [facility];  Service: Endoscopy;;  esophagus   CARDIAC CATHETERIZATION N/A 03/28/2015   Procedure: Left Heart Cath and Coronary Angiography;  Surgeon: Knox Perl, MD;  Location: [facility] INVASIVE CV LAB;  Service: Cardiovascular;  Laterality: N/A;   CARDIAC CATHETERIZATION N/A 03/28/2015   Procedure: Intravascular Pressure Wire/FFR Study;  Surgeon: Knox Perl, MD;  Location: [facility] INVASIVE CV LAB;  Service: Cardiovascular;  Laterality: N/A;   CARPAL TUNNEL RELEASE Left ~ 2008   COLONOSCOPY WITH ESOPHAGOGASTRODUODENOSCOPY (EGD)     COLONOSCOPY WITH PROPOFOL  N/A 12/24/2019   Procedure: COLONOSCOPY WITH PROPOFOL ;  Surgeon: Ruby Corporal, MD;  Location: [facility];  Service: Endoscopy;   Laterality: N/A;  955   ELBOW FRACTURE SURGERY Left ~ 2008   ESOPHAGEAL MANOMETRY N/A 09/16/2021   Procedure: ESOPHAGEAL MANOMETRY (EM);  Surgeon: Annis Kinder, DO;  Location: [facility];  Service: Endoscopy;  Laterality: N/A;   ESOPHAGOGASTRODUODENOSCOPY N/A 12/26/2018   Procedure: ESOPHAGOGASTRODUODENOSCOPY (EGD);  Surgeon: Ruby Corporal, MD;  Location: [facility];  Service: Endoscopy;  Laterality: N/A;   ESOPHAGOGASTRODUODENOSCOPY (EGD) WITH PROPOFOL  N/A 03/23/2019   Procedure: ESOPHAGOGASTRODUODENOSCOPY (EGD) WITH PROPOFOL ;  Surgeon: Ruby Corporal, MD;  Location: [facility];  Service: Endoscopy;  Laterality: N/A;  7:30   FRACTURE SURGERY     ankle and elbow   PERIPHERAL VASCULAR BALLOON ANGIOPLASTY  06/20/2023   Procedure: PERIPHERAL VASCULAR BALLOON ANGIOPLASTY;  Surgeon: Philipp Brawn, MD;  Location: [facility] INVASIVE CV LAB;  Service: Cardiovascular;;   PH IMPEDANCE STUDY N/A 09/16/2021   Procedure: PH IMPEDANCE STUDY;  Surgeon: Annis Kinder, DO;  Location: [facility];  Service: Endoscopy;  Laterality: N/A;   PILONIDAL CYST EXCISION N/A 03/24/2017   Procedure: EXCISION CHRONIC  PILONIDAL ABSCESS;  Surgeon: Oza Blumenthal, MD;  Location: [facility] ORS;  Service: General;  Laterality: N/A;   SCROTAL EXPLORATION N/A 11/13/2020   Procedure: EXCISION OF SEBACEOUS CYSTS, SCROTUM;  Surgeon: Marco Severs, MD;  Location: [facility];  Service: Urology;  Laterality: N/A;   TENDON REPAIR Left ~ 2004   "main tendon in my ankle"   Social History   Occupational History   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  Tobacco Use   Smoking status: Every Day    Current packs/day: 1.00    Average packs/day: 1 pack/day for 34.0 years (34.0 ttl pk-yrs)    Types: Cigarettes    Passive exposure: Never   Smokeless tobacco: Never   Tobacco comments:    1/2 ppd -1 ppd - patient states "trying to cut back" as of 06/18/23 km  Vaping Use   Vaping status: Never Used  Substance and Sexual  Activity    Alcohol use: Never    Alcohol/week: 0.0 standard drinks of alcohol   Drug use: Never   Sexual activity: Not Currently

## 2023-11-09 ENCOUNTER — Ambulatory Visit: Payer: MEDICAID | Admitting: Internal Medicine

## 2023-11-14 ENCOUNTER — Encounter (HOSPITAL_BASED_OUTPATIENT_CLINIC_OR_DEPARTMENT_OTHER): Payer: Self-pay | Admitting: Pulmonary Disease

## 2023-11-14 ENCOUNTER — Ambulatory Visit: Payer: MEDICAID | Attending: Cardiology | Admitting: Cardiology

## 2023-11-14 ENCOUNTER — Ambulatory Visit (HOSPITAL_BASED_OUTPATIENT_CLINIC_OR_DEPARTMENT_OTHER): Payer: MEDICAID | Admitting: Pulmonary Disease

## 2023-11-14 ENCOUNTER — Encounter: Payer: Self-pay | Admitting: Cardiology

## 2023-11-14 VITALS — BP 120/72 | HR 92 | Ht 68.0 in | Wt 154.0 lb

## 2023-11-14 VITALS — BP 160/85 | HR 98 | Ht 68.0 in | Wt 154.0 lb

## 2023-11-14 DIAGNOSIS — Z720 Tobacco use: Secondary | ICD-10-CM

## 2023-11-14 DIAGNOSIS — I25119 Atherosclerotic heart disease of native coronary artery with unspecified angina pectoris: Secondary | ICD-10-CM | POA: Diagnosis present

## 2023-11-14 DIAGNOSIS — I951 Orthostatic hypotension: Secondary | ICD-10-CM | POA: Insufficient documentation

## 2023-11-14 DIAGNOSIS — E782 Mixed hyperlipidemia: Secondary | ICD-10-CM | POA: Insufficient documentation

## 2023-11-14 DIAGNOSIS — F17210 Nicotine dependence, cigarettes, uncomplicated: Secondary | ICD-10-CM

## 2023-11-14 DIAGNOSIS — R0609 Other forms of dyspnea: Secondary | ICD-10-CM | POA: Diagnosis not present

## 2023-11-14 DIAGNOSIS — I739 Peripheral vascular disease, unspecified: Secondary | ICD-10-CM | POA: Diagnosis present

## 2023-11-14 DIAGNOSIS — I2510 Atherosclerotic heart disease of native coronary artery without angina pectoris: Secondary | ICD-10-CM | POA: Insufficient documentation

## 2023-11-14 NOTE — Patient Instructions (Signed)
 Medication Instructions:  Your physician recommends that you continue on your current medications as directed. Please refer to the Current Medication list given to you today.   Labwork: Fasting lipids  Testing/Procedures: none  Follow-Up: 6 months  Any Other Special Instructions Will Be Listed Below (If Applicable).  If you need a refill on your cardiac medications before your next appointment, please call your pharmacy.

## 2023-11-14 NOTE — Progress Notes (Signed)
   Subjective:    Patient ID: [name] [name], male    DOB: [birth year], 53 y.o.   MRN: [id number]  HPI   63 yowm active smoker transferring care to me from MW for COPD  MW comment - 'He barely has copd at all and yet severely incapacitated chronically and it's difficult to put this together '  PMH : DM-2 on insulin  PAD - s/p angioplasty of the left popliteal artery 06/2023, restensosis, gangrene of toes s/p amputation    He experiences shortness of breath and dizziness during physical activities such as walking or grocery shopping, feeling 'worn out' and needing to stop frequently. He also feels pressure in his back and hips during these episodes. He uses Symbicort  and albuterol  as needed, especially during a recent panic attack.  He is a heavy smoker and has attempted to quit by staying indoors, as he does not smoke inside his house, but struggles when outside. He has been denied insurance coverage for smoking cessation aids.  He reports sleep disturbances, including a history of sleep apnea for which he was prescribed a CPAP machine. However, he was unable to obtain the CPAP due to insurance and cost issues. He mentions stopping breathing at night and gasping during sleep.  He has previously undergone pulmonary function tests and recalls feeling lightheaded and nearly fainting during a breathing test, which required emergency room evaluation.  Significant tests/ events reviewed  06/11/2022 : Eos 0.3 alpha one AT phenotype MM level 147   -PFT's 02/08/23  FEV1 3.07 (85 % ) ratio 0.69  p 0 % improvement from saba p symbicort  prior to study with DLCO  17.13 (63%)     07/2022 CT chest wo con >> minimal RUL atx  07/2022 HST neg   Review of Systems neg for any significant sore throat, dysphagia, itching, sneezing, nasal congestion or excess/ purulent secretions, fever, chills, sweats, unintended wt loss, pleuritic or exertional cp, hempoptysis, orthopnea pnd or change in chronic leg  swelling. Also denies presyncope, palpitations, heartburn, abdominal pain, nausea, vomiting, diarrhea or change in bowel or urinary habits, dysuria,hematuria, rash, arthralgias, visual complaints, headache, numbness weakness or ataxia.     Objective:   Physical Exam  Gen. Pleasant, well-nourished, in no distress ENT - no thrush, no pallor/icterus,no post nasal drip Neck: No JVD, no thyromegaly, no carotid bruits Lungs: no use of accessory muscles, no dullness to percussion, clear without rales or rhonchi  Cardiovascular: Rhythm regular, heart sounds  normal, no murmurs or gallops, no peripheral edema Musculoskeletal: Lt toe amputations, no cyanosis or clubbing        Assessment & Plan:   Nicotine  dependence/ Tobacco abuse Chronic nicotine  dependence exacerbating PAD and circulation issues. Previous smoking cessation attempts failed due to insurance denial of cessation aids. Continued smoking risks further vascular complications, including potential limb loss. - Encourage smoking cessation as a primary intervention to prevent further vascular complications.  DOE/ treat as Asthma Asthma with exertional shortness of breath and dizziness. Maintained on Symbicort  for symptom control. Albuterol  used as needed for acute symptoms. No significant COPD on pulmonary function tests. Previous PFTs caused dizziness requiring emergency evaluation. - Prescribe Symbicort  for daily asthma management. - Prescribe albuterol  for acute asthma symptoms as needed. - Recommend a conditioning program to enhance physical condition.

## 2023-11-14 NOTE — Patient Instructions (Signed)
 Symbicort  - 2 puffs twice daily  Albuterol  as needed  You really have to QUIT cmoking!!

## 2023-11-14 NOTE — Progress Notes (Signed)
    Cardiology Office Note  Date: 11/14/2023   ID: DECLIN RAJAN, DOB 06/19/1970, MRN [id number]  History of Present Illness: [name] [name] is a 53 y.o. male last seen in December 2024.  He is here for a follow-up visit.  I reviewed interval records, now being followed by [facility] for management of PAD as discussed below.  He does not describe any angina with typical activity, stable NYHA class II dyspnea.  No palpitations or syncope.  We went over his medications.  He reports compliance with therapy.  His last LDL in October 2024 had come down to 98.  We discussed getting a follow-up lipid panel.  I reviewed his interval lab work.  Physical Exam: VS:  BP 120/72   Pulse 92   Ht 5' 8 (1.727 m)   Wt 154 lb (69.9 kg)   SpO2 98%   BMI 23.42 kg/m , BMI Body mass index is 23.42 kg/m.  Wt Readings from Last 3 Encounters:  11/14/23 154 lb (69.9 kg)  11/14/23 154 lb (69.9 kg)  11/01/23 153 lb (69.4 kg)    General: Patient appears comfortable at rest. HEENT: Conjunctiva and lids normal. Neck: Supple, no elevated JVP. Lungs: Clear to auscultation, nonlabored breathing at rest. Cardiac: Regular rate and rhythm, no S3 or significant systolic murmur. Extremities: No pitting edema, status post toe amputations on the left.  ECG:  An ECG dated 02/08/2023 was personally reviewed today and demonstrated:  Sinus rhythm with right bundle branch block, left anterior fascicular block.  Labwork: 02/08/2023: Magnesium  1.8 07/20/2023: BUN 26; Creatinine, Ser 2.14; Hemoglobin 12.4; Platelets 297; Potassium 4.3; Sodium 133     Component Value Date/Time   CHOL 173 11/09/2022 1036   TRIG 110 11/09/2022 1036   HDL 34 (L) 11/09/2022 1036   CHOLHDL 5.1 11/09/2022 1036   VLDL 22 11/09/2022 1036   LDLCALC 117 (H) 11/09/2022 1036   LDLDIRECT 181.3 (H) 11/03/2020 1139  October 2024: Cholesterol 162, triglycerides 130, HDL 41, LDL 98  Other Studies Reviewed Today:  No interval cardiac testing for review  today.  Assessment and Plan:  1.  CAD, moderate nonobstructive LAD stenosis documented in 2016.  Follow-up Myoview  in 2022 suggested mild inferior apical ischemic territory which was managed medically.  Echocardiogram in March 2024 revealed LVEF 55 to 60%.  He has not had any angina or interval nitroglycerin  use.  Continues on aspirin  81 mg daily, Lipitor  80 mg daily, Zetia  10 mg daily, and as needed nitroglycerin .   2.  Orthostatic hypotension.  Symptomatically stable on midodrine  10 mg 3 times a day.   3.  Mixed hyperlipidemia.  Recheck FLP.  Currently on Lipitor  80 mg daily and Zetia  10 mg daily.   4.  Tobacco abuse.  Discussed importance of tobacco cessation.  He states that he is trying to quit.  5.  PAD followed by [facility].  He has history of drug-coated balloon angioplasty of the left popliteal artery in January due to critical limb ischemia.  Did have to undergo left fifth toe amputation requiring subsequent revision and also amputation of his left 4th and 5th toes.  Disposition:  Follow up 6 months.  Signed, Gerard Knight, M.D., F.A.C.C. [facility]

## 2023-11-15 ENCOUNTER — Other Ambulatory Visit (HOSPITAL_COMMUNITY)
Admission: RE | Admit: 2023-11-15 | Discharge: 2023-11-15 | Disposition: A | Discharge status: Home / Self Care | Payer: MEDICAID | Source: Ambulatory Visit | Attending: Cardiology | Admitting: Cardiology

## 2023-11-15 ENCOUNTER — Ambulatory Visit: Payer: Self-pay | Admitting: Cardiology

## 2023-11-15 DIAGNOSIS — I2510 Atherosclerotic heart disease of native coronary artery without angina pectoris: Secondary | ICD-10-CM | POA: Diagnosis present

## 2023-11-15 LAB — LIPID PANEL
Cholesterol: 227 mg/dL — ABNORMAL HIGH (ref 0–200)
HDL: 43 mg/dL (ref >40)
LDL Cholesterol: 161 mg/dL — ABNORMAL HIGH (ref 0–99)
Total CHOL/HDL Ratio: 5.3 ratio
Triglycerides: 115 mg/dL (ref <150)
VLDL: 23 mg/dL (ref 0–40)

## 2023-12-01 MED ORDER — EZETIMIBE 10 MG PO TABS: 10.0000 mg | ORAL_TABLET | Freq: Every day | ORAL | 3 refills | Status: AC

## 2023-12-01 MED ORDER — ATORVASTATIN CALCIUM 80 MG PO TABS: 80.0000 mg | ORAL_TABLET | Freq: Every day | ORAL | 3 refills | Status: AC

## 2023-12-01 NOTE — Telephone Encounter (Signed)
 Patient is returning call to discuss lab results. Please call [phone].

## 2023-12-01 NOTE — Telephone Encounter (Signed)
  [name] KANDICE Sierras, MD 11/15/2023 10:31 AM EDT     Results reviewed.  Please clarify compliance with Lipitor  80 mg daily and Zetia  10 mg daily.  His lipids are still not well-controlled, cholesterol 227 and LDL 161.  If he has been taking medications as directed, we may need to consider adding Leqvio if he is willing, could potentially refer him to the infusion center in [place] to have them do the pre-certification and see if this is covered by his insurance.      I spoke with patient and discussed his lab results. He states he has not been taking his medication daily as prescribed and he often misses days. He agrees to take his medication daily now. I have sent 90 day refills of atorvastatin  and zetia  to [facility]

## 2023-12-13 ENCOUNTER — Telehealth: Payer: Self-pay

## 2023-12-28 ENCOUNTER — Ambulatory Visit: Payer: MEDICAID | Admitting: Internal Medicine

## 2023-12-29 ENCOUNTER — Encounter: Payer: Self-pay | Admitting: Orthopedic Surgery

## 2023-12-29 ENCOUNTER — Ambulatory Visit: Payer: MEDICAID | Admitting: Orthopedic Surgery

## 2023-12-29 DIAGNOSIS — M6702 Short Achilles tendon (acquired), left ankle: Secondary | ICD-10-CM | POA: Diagnosis not present

## 2023-12-29 DIAGNOSIS — Z89421 Acquired absence of other right toe(s): Secondary | ICD-10-CM

## 2023-12-29 DIAGNOSIS — S98132A Complete traumatic amputation of one left lesser toe, initial encounter: Secondary | ICD-10-CM

## 2023-12-29 NOTE — Progress Notes (Signed)
 Office Visit Note   Patient: [name] [name]           Date of Birth: 02/02/1971           MRN: [id number] Visit Date: 12/29/2023              Requested by: No referring provider defined for this encounter. 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  Patient does have custom orthotics.  He was concerned that we had to write the prescription and his primary care physician had to approve it.  Patient is 5 and half months status post surgery.  Assessment & Plan: Visit Diagnoses: No diagnosis found.  Plan: Recommend continuing with Achilles stretching to unload forefoot pressure.  Follow-Up Instructions: No follow-ups on file.   Ortho Exam  Patient is alert, oriented, no adenopathy, well-dressed, normal affect, normal respiratory effort. Examination with the knee extended patient has dorsiflexion just past neutral.  Patient was given instructions and recommendations for Achilles stretching.  The 4th and 5th rays are well-healed he has good fitting custom orthotics there is no callus or ulcers no cellulitis.    Imaging: No results found. No images are attached to the encounter.  Labs: Lab Results  Component Value Date   HGBA1C 7.3 (A) 10/27/2023   HGBA1C 8.7 (A) 07/12/2023   HGBA1C 9.0 (A) 04/11/2023   ESRSEDRATE 20 (H) 06/18/2022   ESRSEDRATE 1 08/20/2015   CRP <0.5 06/18/2022   CRP 2.5 01/23/2019   CRP <0.5 08/20/2015   REPTSTATUS 06/18/2023 FINAL 06/13/2023   CULT  06/13/2023    NO GROWTH 5 DAYS Performed at [facility], [address], [place], [place] [address]      Lab Results  Component Value Date   ALBUMIN 3.2 (L) 08/27/2022   ALBUMIN 2.8 (L) 07/26/2022   ALBUMIN 4.1 07/06/2022    Lab Results  Component Value Date   MG 1.8 02/08/2023   MG 2.0 08/29/2022   MG 1.5 (L) 08/28/2022    No results found for: VD25OH  No results found for: PREALBUMIN    Latest Ref Rng & Units 07/20/2023    7:58 AM 06/20/2023    5:32 AM 06/13/2023    6:05 PM  CBC EXTENDED  WBC 4.0 - 10.5 K/uL 14.1   13.2   RBC 4.22 - 5.81 MIL/uL 4.08   4.59   Hemoglobin 13.0 - 17.0 g/dL 87.5  86.3  85.7   HCT 39.0 - 52.0 % 37.1  40.0  43.5   Platelets 150 - 400 K/uL 297   251   NEUT# 1.7 - 7.7 K/uL   9.7   Lymph# 0.7 - 4.0 K/uL   2.4      There is no height or weight on file to calculate BMI.  Orders:  No orders of the defined types were placed in this encounter.  No orders of the defined types were placed in this encounter.    Procedures: No procedures performed  Clinical Data: No additional findings.  ROS:  All other systems negative, except as noted in the HPI. Review of Systems  Objective: Vital Signs: There were no vitals taken for this visit.  Specialty Comments:  No specialty comments available.  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) 07/12/2023   Gangrene of toe of left foot (HCC) 06/24/2023   Type 2 diabetes mellitus with diabetic polyneuropathy, with long-term current use of insulin  (HCC) 04/11/2023   Type 2 diabetes mellitus with hyperglycemia, with long-term current use of insulin  (HCC) 04/11/2023   Diabetes mellitus (HCC) 04/11/2023   PTSD (post-traumatic stress disorder) 03/14/2023   Aortic valve sclerosis 09/16/2022   Hypoglycemia 08/27/2022   COPD GOLD 1/ AB 06/11/2022   Syncope and collapse 12/13/2021   Bifascicular block 12/13/2021   SIRS (systemic inflammatory response syndrome) (HCC) 12/13/2021   Renal insufficiency 12/13/2021   Esophageal motility disorder 12/13/2021   Heartburn    Scrotal abscess 11/19/2020   Severe sepsis (HCC) 09/03/2020   Non-adherence to medical treatment 05/27/2020   Barrett's esophagus 01/23/2019   Diarrhea 01/23/2019   MDD (major depressive  disorder), severe (HCC) 12/17/2018   DKA, type 2 (HCC) 11/07/2018   High anion gap metabolic acidosis 03/22/2018   Epigastric abdominal pain 03/22/2018   Nausea vomiting and diarrhea 03/22/2018   AKI (acute kidney injury) (HCC) 03/22/2018   Uncontrolled type 2 diabetes mellitus with hyperglycemia (HCC) 03/22/2018   ARF (acute renal failure) (HCC) 05/30/2017   Mixed hyperlipidemia 03/31/2017   Diabetic polyneuropathy (HCC) 03/31/2017   Vitamin B12 deficiency 03/31/2017   Vitamin D  deficiency 03/31/2017   Right bundle branch block 02/14/2017   DKA (diabetic ketoacidosis) (HCC) 10/15/2016   Gastroesophageal reflux disease 10/15/2016   Benign hypertension 10/15/2016   Leukocytosis 11/25/2015   Intractable nausea and vomiting 11/25/2015   Uncontrolled type 2 diabetes mellitus with hypoglycemia, with long-term current use of insulin  (HCC) 11/25/2015   Dental abscess 11/25/2015   Nausea and vomiting 11/25/2015   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 03/2015 Cath: LM nl, LAD mild diff dzs throughout w/ 50p/d, LCX diff dzs, 79m, RCA diff dzs, 67m-->Med rx; b. 12/2020 MV: EF 57%, small/mild apical inferior defect w/ partial rev/mild ischemia.   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, bilateral    Sleep  apnea 2016   does not use CPAP   Type 2 diabetes mellitus (HCC)     Family History  Problem Relation Age of Onset   Diabetes Mother    Hypertension Mother    Cancer Mother    Throat cancer Mother    Diabetes Father    Hypertension Father    Hyperlipidemia Father    Congestive Heart Failure Sister    Colon cancer Neg Hx    Pancreatic cancer Neg Hx    Liver disease Neg Hx     Past Surgical History:  Procedure Laterality Date   ABDOMINAL AORTOGRAM W/LOWER EXTREMITY N/A 06/20/2023  Procedure: ABDOMINAL AORTOGRAM W/LOWER EXTREMITY;  Surgeon: Pearline Norman RAMAN, MD;  Location: [facility] INVASIVE CV LAB;  Service: Cardiovascular;  Laterality: N/A;   AMPUTATION Left 06/24/2023   Procedure: LEFT 5TH TOE AMPUTATION;  Surgeon: Harden Jerona GAILS, MD;  Location: [facility] OR;  Service: Orthopedics;  Laterality: Left;   AMPUTATION Left 07/20/2023   Procedure: LEFT FOOT 4TH AND 5TH RAY AMPUTATION;  Surgeon: Harden Jerona GAILS, MD;  Location: [facility] OR;  Service: Orthopedics;  Laterality: Left;   ANKLE SURGERY Right 1982   had extra bones in there; took them out   APPENDECTOMY  1975   BIOPSY  12/26/2018   Procedure: BIOPSY;  Surgeon: Golda Claudis PENNER, MD;  Location: [facility] SU[facility];  Service: Endoscopy;;  duodenal biopsies   BIOPSY  03/23/2019   Procedure: BIOPSY;  Surgeon: Golda Claudis PENNER, MD;  Location: [facility];  Service: Endoscopy;;  esophagus   CARDIAC CATHETERIZATION N/A 03/28/2015   Procedure: Left Heart Cath and Coronary Angiography;  Surgeon: Gordy Bergamo, MD;  Location: [facility] INVASIVE CV LAB;  Service: Cardiovascular;  Laterality: N/A;   CARDIAC CATHETERIZATION N/A 03/28/2015   Procedure: Intravascular Pressure Wire/FFR Study;  Surgeon: Gordy Bergamo, MD;  Location: [facility] INVASIVE CV LAB;  Service: Cardiovascular;  Laterality: N/A;   CARPAL TUNNEL RELEASE Left ~ 2008   COLONOSCOPY WITH ESOPHAGOGASTRODUODENOSCOPY (EGD)     COLONOSCOPY WITH PROPOFOL  N/A 12/24/2019   Procedure: COLONOSCOPY WITH PROPOFOL ;  Surgeon: Golda Claudis PENNER, MD;  Location: [facility];  Service: Endoscopy;  Laterality: N/A;  955   ELBOW FRACTURE SURGERY Left ~ 2008   ESOPHAGEAL MANOMETRY N/A 09/16/2021   Procedure: ESOPHAGEAL MANOMETRY (EM);  Surgeon: San Sandor GAILS, DO;  Location: [facility];  Service: Endoscopy;  Laterality: N/A;   ESOPHAGOGASTRODUODENOSCOPY N/A 12/26/2018   Procedure: ESOPHAGOGASTRODUODENOSCOPY (EGD);  Surgeon: Golda Claudis PENNER, MD;  Location: [facility];  Service: Endoscopy;  Laterality: N/A;   ESOPHAGOGASTRODUODENOSCOPY (EGD) WITH PROPOFOL  N/A 03/23/2019   Procedure: ESOPHAGOGASTRODUODENOSCOPY (EGD) WITH PROPOFOL ;  Surgeon: Golda Claudis PENNER, MD;  Location: [facility];  Service: Endoscopy;  Laterality: N/A;  7:30   FRACTURE SURGERY     ankle and elbow   PERIPHERAL VASCULAR BALLOON ANGIOPLASTY  06/20/2023   Procedure: PERIPHERAL VASCULAR BALLOON ANGIOPLASTY;  Surgeon: Pearline Norman RAMAN, MD;  Location: [facility] INVASIVE CV LAB;  Service: Cardiovascular;;   PH IMPEDANCE STUDY N/A 09/16/2021   Procedure: PH IMPEDANCE STUDY;  Surgeon: San Sandor GAILS, DO;  Location: [facility]OS[facility];  Service: Endoscopy;  Laterality: N/A;   PILONIDAL CYST EXCISION N/A 03/24/2017   Procedure: EXCISION CHRONIC  PILONIDAL ABSCESS;  Surgeon: Vernetta Berg, MD;  Location: [facility] ORS;  Service: General;  Laterality: N/A;   SCROTAL EXPLORATION N/A 11/13/2020   Procedure: EXCISION OF SEBACEOUS CYSTS, SCROTUM;  Surgeon: Sherrilee Belvie CROME, MD;  Location: [facility] OR[facility];  Service: Urology;  Laterality: N/A;   TENDON REPAIR Left ~ 2004   main tendon in my ankle   Social History   Occupational History   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  Tobacco Use   Smoking status: Every Day    Current packs/day: 1.00    Average packs/day: 1 pack/day for 34.0 years (34.0 ttl pk-yrs)    Types: Cigarettes    Passive exposure: Never   Smokeless tobacco: Never   Tobacco comments:    1/2 ppd -1 ppd - patient states trying to cut back as of 06/18/23 km  Vaping  Use   Vaping status: Never Used  Substance and  Sexual Activity   Alcohol use: Never    Alcohol/week: 0.0 standard drinks of alcohol   Drug use: Never   Sexual activity: Not Currently

## 2023-12-29 NOTE — Telephone Encounter (Signed)
 Done

## 2024-01-16 NOTE — Progress Notes (Unsigned)
 [facility] [address] [place], [place] [address]   CLINIC:  Medical Oncology/Hematology  PCP:  [name], PA-C [address] [place] [address] [phone]   REASON FOR VISIT:  Follow-up for leukocytosis  PRIOR THERAPY: None  CURRENT THERAPY: Surveillance  INTERVAL HISTORY:   [name] [name] 53 y.o. male returns for routine follow-up of leukocytosis.  He was last seen by Pleasant Barefoot PA-C on 01/18/2023.    At today's visit, he reports feeling somewhat poorly due to his multiple chronic health conditions. *** ***He took antibiotics about a month ago for an infected ulcer on his foot.   ***No use of systemic steroids, but uses Symbicort  (steroid containing) inhaler daily.   ***No B symptoms reported.   ***He denies any new lumps or bumps.   ***He continues to smoke 0.5 PPD.  He has little to no*** energy and 100***% appetite. He endorses that he is maintaining a stable weight.  ASSESSMENT & PLAN:  1.  Neutrophilic leukocytosis - Patient seen at the request of Scott Long, PA-C - Intermittent leukocytosis (neutrophil predominant) since 2017 - No personal history of connective tissue disorders. - Current everyday smoker (0.5 PPD)*** - Uses a Symbicort  inhaler daily.*** - Received antibiotics for diabetic foot ulcer in July 2024. *** No frequent or recurrent infections. - No B symptoms.*** - CTAP (11/29/2021): Spleen appears normal. - Hematology workup (06/18/2022):  Elevated ESR (20).  Normal LDH, CRP. Negative JAK2 with reflex to CALR, MPL, Exon 12-15 Negative BCR/ABL FISH - Most recent CBC (***): WBC ***/ANC ***, more or less at baseline - No lymphadenopathy or palpable splenomegaly on exam *** - PLAN: *** Discharge to PCP?  *** MPN workup negative, no evidence of malignant leukocytosis at this time. - Differential diagnosis favors reactive leukocytosis in the setting of tobacco use, steroid containing inhaler, and chronic pro inflammatory  state. - We will check labs (CBC/D) with office visit in 1 year, and consider possible discharge to PCP if stable  2.  Other history - Other PMH includes coronary artery disease, COPD, type 2 diabetes mellitus, sleep apnea, hypertension, hyperlipidemia, arthritis, anxiety/depression - He lives at home with his father.  Trying to get disability.  He previously worked at [employer] at [employer].  He is current active smoker, half pack per day for 38 years. - Mother had breast cancer and mantle cell lymphoma. - Maternal grandmother had breast cancer.  Maternal aunt had breast cancer.  PLAN SUMMARY: *** TBD *** >> Labs in 1 year (CBC/D only) + same-day OFFICE visit     REVIEW OF SYSTEMS: ***  Review of Systems  Constitutional:  Positive for fatigue. Negative for appetite change, chills, diaphoresis, fever and unexpected weight change.  HENT:   Negative for lump/mass, nosebleeds and trouble swallowing (difficulty chewing due to teeth).   Eyes:  Negative for eye problems.  Respiratory:  Negative for cough, hemoptysis and shortness of breath.   Cardiovascular:  Negative for chest pain, leg swelling and palpitations.  Gastrointestinal:  Positive for nausea and vomiting. Negative for abdominal pain, blood in stool, constipation and diarrhea.  Genitourinary:  Positive for frequency. Negative for hematuria.   Musculoskeletal:  Positive for back pain.  Skin: Negative.   Neurological:  Positive for dizziness and numbness. Negative for headaches and light-headedness.  Hematological:  Does not bruise/bleed easily.  Psychiatric/Behavioral:  Positive for depression and sleep disturbance. The patient is nervous/anxious.      PHYSICAL EXAM:***  ECOG PERFORMANCE STATUS: 2 -  Symptomatic, <50% confined to bed  There were no vitals filed for this visit. There were no vitals filed for this visit. Physical Exam Constitutional:      Appearance: Normal appearance. He is normal weight.   Cardiovascular:     Heart sounds: Normal heart sounds.  Pulmonary:     Breath sounds: Normal breath sounds.  Neurological:     General: No focal deficit present.     Mental Status: Mental status is at baseline.  Psychiatric:        Behavior: Behavior normal. Behavior is cooperative.     PAST MEDICAL/SURGICAL HISTORY:  Past Medical History:  Diagnosis Date   Anxiety    Arthritis    Asthma    CAD (coronary artery disease)    a. 03/2015 Cath: LM nl, LAD mild diff dzs throughout w/ 50p/d, LCX diff dzs, 36m, RCA diff dzs, 43m-->Med rx; b. 12/2020 MV: EF 57%, small/mild apical inferior defect w/ partial rev/mild ischemia.   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, bilateral    Sleep apnea 2016   does not use CPAP   Type 2 diabetes mellitus (HCC)    Past Surgical History:  Procedure Laterality Date   ABDOMINAL AORTOGRAM W/LOWER EXTREMITY N/A 06/20/2023   Procedure: ABDOMINAL AORTOGRAM W/LOWER EXTREMITY;  Surgeon: Pearline Norman RAMAN, MD;  [facility]: [facility];  Service: Cardiovascular;  Laterality: N/A;   AMPUTATION Left 06/24/2023   Procedure: LEFT 5TH TOE AMPUTATION;  Surgeon: Harden Jerona GAILS, MD;  Location: [facility] OR;  Service: Orthopedics;  Laterality: Left;   AMPUTATION Left 07/20/2023   Procedure: LEFT FOOT 4TH AND 5TH RAY AMPUTATION;  Surgeon: Harden Jerona GAILS, MD;  Location: [facility] OR;  Service: Orthopedics;  Laterality: Left;   ANKLE SURGERY Right 1982   had extra bones in there; took them out   APPENDECTOMY  1975   BIOPSY  12/26/2018   Procedure: BIOPSY;  Surgeon: Golda Claudis PENNER, MD;  Location: [facility];  Service:  Endoscopy;;  duodenal biopsies   BIOPSY  03/23/2019   Procedure: BIOPSY;  Surgeon: Golda Claudis PENNER, MD;  [facility]: [facility];  Service: Endoscopy;;  esophagus   CARDIAC CATHETERIZATION N/A 03/28/2015   Procedure: Left Heart Cath and Coronary Angiography;  Surgeon: Gordy Bergamo, MD;  Location[facility]VASIVE CV LAB;  Service: Cardiovascular;  Laterality: N/A;   CARDIAC CATHETERIZATION N/A 03/28/2015   Procedure: Intravascular Pressure Wire/FFR Study;  Surgeon: Gordy Bergamo, MD;  Location: [facility] INVASIVE CV LAB;  Service: Cardiovascular;  Laterality: N/A;   CARPAL TUNNEL RELEASE Left ~ 2008   COLONOSCOPY WITH ESOPHAGOGASTRODUODENOSCOPY (EGD)     COLONOSCOPY WITH PROPOFOL  N/A 12/24/2019   Procedure: COLONOSCOPY WITH PROPOFOL ;  Surgeon: Golda Claudis PENNER, MD;  [facility];  Service: Endoscopy;  Laterality: N/A;  955   ELBOW FRACTURE SURGERY Left ~ 2008   ESOPHAGEAL MANOMETRY N/A 09/16/2021   Procedure: ESOPHAGEAL MANOMETRY (EM);  Surgeon: San Sandor GAILS, DO;  Location: [facility] ENDOSCOPY;  Service: Endoscopy;  Laterality: N/A;   ESOPHAGOGASTRODUODENOSCOPY N/A 12/26/2018   Procedure: ESOPHAGOGASTRODUODENOSCOPY (EGD);  Surgeon: Golda Claudis PENNER, MD;  [facility];  Service: Endoscopy;  Laterality: N/A;   ESOPHAGOGASTRODUODENOSCOPY (EGD) WITH PROPOFOL  N/A 03/23/2019   Procedure: ESOPHAGOGASTRODUODENOSCOPY (EGD) WITH PROPOFOL ;  Surgeon: Golda Claudis PENNER, MD;  [facility];  Service: Endoscopy;  Laterality: N/A;  7:30   FRACTURE SURGERY     ankle and elbow   PERIPHERAL VASCULAR BALLOON ANGIOPLASTY  06/20/2023   Procedure: PERIPHERAL VASCULAR BALLOON ANGIOPLASTY;  Surgeon: Pearline Norman RAMAN, MD;  Location: [facility];  Service: Cardiovascular;;   PH IMPEDANCE STUDY N/A 09/16/2021   Procedure: PH IMPEDANCE STUDY;  Surgeon: [place] GAILS, DO;  Location: [facility]OS[facility];  Service: Endoscopy;  Laterality: N/A;   PILONIDAL CYST EXCISION N/A 03/24/2017   Procedure: EXCISION  CHRONIC  PILONIDAL ABSCESS;  Surgeon: Vernetta Berg, MD;  Location: [facility] ORS;  Service: General;  Laterality: N/A;   SCROTAL EXPLORATION N/A 11/13/2020   Procedure: EXCISION OF SEBACEOUS CYSTS, SCROTUM;  Surgeon: Sherrilee Belvie CROME, MD;  Location: [facility] ORS;  Service: Urology;  Laterality: N/A;   TENDON REPAIR Left ~ 2004   main tendon in my ankle    SOCIAL HISTORY:  Social History   Socioeconomic History   Marital status: Single    Spouse name: Not on file   Number of children: 0   Years of education: 10th g   Highest education level: Not on file  Occupational History   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  Other Topics Concern   Not on file  Social History Narrative   His main caretaker, his mother has terminal metastatic cancer.   Social Drivers of Health   Financial Resource Strain: High Risk (06/24/2022)   Overall Financial Resource Strain (CARDIA)    Difficulty of Paying Living Expenses: Very hard  Food Insecurity: Food Insecurity Present (06/18/2022)   Hunger Vital Sign    Worried About Running Out of Food in the Last Year: Often true    Ran Out of Food in the Last Year: Often true  Transportation Needs: No Transportation Needs (06/18/2022)   PRAPARE - Administrator, Civil Service (Medical): No    Lack of Transportation (Non-Medical): No  Physical Activity: Not on file  Stress: Not on file  Social Connections: Unknown (05/25/2022)   Received from [facility]    Social Network    Social Network: Not on file  Intimate Partner Violence: Not At Risk (06/18/2022)   Humiliation, Afraid, Rape, and Kick questionnaire    Fear of Current or Ex-Partner: No    Emotionally Abused: No    Physically Abused: No    Sexually Abused: No    FAMILY HISTORY:  Family History  Problem Relation Age of Onset   Diabetes Mother    Hypertension Mother    Cancer Mother    Throat cancer Mother    Diabetes Father    Hypertension  Father    Hyperlipidemia Father    Congestive Heart Failure Sister    Colon cancer Neg Hx    Pancreatic cancer Neg Hx    Liver disease Neg Hx     CURRENT MEDICATIONS:  Outpatient Encounter Medications as of 01/17/2024  Medication Sig Note   Accu-Chek FastClix Lancets MISC Check blood sugar 3 times daily    acetaminophen  (TYLENOL ) 500 MG tablet Take 1,000 mg by mouth 2 (two) times daily as needed for moderate pain, fever or headache.    albuterol  (VENTOLIN  HFA) 108 (90 Base) MCG/ACT inhaler Inhale 2 puffs into the lungs every 6 (six) hours as needed for wheezing or shortness of breath.    ALPRAZolam  (XANAX ) 1 MG tablet Take 1 mg by mouth at bedtime as needed for anxiety or sleep.    amLODipine (NORVASC) 5 MG tablet Take 5 mg by mouth every evening.    ARIPiprazole  (ABILIFY ) 10 MG tablet Take 10 mg by mouth in the morning.    aspirin  EC 81 MG tablet Take 81 mg by mouth daily with breakfast. 07/19/2023: Last dose of ASA was in January 2025.   atorvastatin  (LIPITOR ) 80 MG tablet Take 1 tablet (80 mg total) by mouth at bedtime.    Blood Glucose Monitoring Suppl (ACCU-CHEK GUIDE) w/Device KIT Use to check blood sugar 3 times daily    budesonide -formoterol  (SYMBICORT ) 80-4.5 MCG/ACT inhaler Take 2 puffs first thing in am and then another 2 puffs about 12 hours later. (Patient taking differently: Inhale 2 puffs into the lungs 2 (two) times daily as needed (wheeze, shortness of breath).)    clopidogrel  (PLAVIX ) 75 MG tablet Take 1 tablet (75 mg total)  by mouth daily.    Continuous Glucose Sensor (FREESTYLE LIBRE 2 PLUS SENSOR) MISC 1 Device by Does not apply route every 14 (fourteen) days. (Patient not taking: Reported on 11/14/2023)    Continuous Glucose Sensor (FREESTYLE LIBRE 2 SENSOR) MISC Change sensors every 14 days (Patient not taking: Reported on 11/14/2023)    Continuous Glucose Sensor (FREESTYLE LIBRE 3 PLUS SENSOR) MISC 1 Device by Other route every 14 (fourteen) days. Change sensor every 15 days. (Patient not taking: Reported on 11/14/2023)    doxycycline  (VIBRA -TABS) 100 MG tablet TAKE 1 TABLET BY MOUTH TWICE A DAY    ezetimibe  (ZETIA ) 10 MG tablet Take 1 tablet (10 mg total) by mouth daily.    famotidine  (PEPCID ) 40 MG tablet Take 1 tablet (40 mg total) by mouth 2 (two) times daily.    gabapentin  (NEURONTIN ) 300 MG capsule Take 1 capsule (300 mg total) by mouth every 8 (eight) hours as needed.    glucose blood (ACCU-CHEK GUIDE) test strip Check blood sugar 3 times daily    glucose blood (TRUE METRIX BLOOD GLUCOSE TEST) test strip Check blood sugar 3 times daily    HYDROcodone -acetaminophen  (NORCO/VICODIN) 5-325 MG tablet Take 1 tablet by mouth every 6 (six) hours as needed. (Patient not taking: Reported on 11/14/2023)    insulin  aspart (NOVOLOG  FLEXPEN) 100 UNIT/ML FlexPen Start CF: NovoLog  (BG-130/45) TIDQAC  Max daily 30 units (Patient taking differently: Inject 0-7 Units into the skin 3 (three) times daily before meals. Start CF: NovoLog  (BG-130/45) TIDQAC  Max daily 30 units) 07/20/2023: 2 units on 02/18; evening   insulin  glargine (LANTUS  SOLOSTAR) 100 UNIT/ML Solostar Pen Inject 12 Units into the skin daily.    Insulin  Pen Needle 32G X 4 MM MISC 1 Device by Does not apply route in the morning, at noon, in the  evening, and at bedtime.    linaclotide  (LINZESS ) 145 MCG CAPS capsule Take 1 capsule (145 mcg total) by mouth daily before breakfast.    midodrine  (PROAMATINE ) 10 MG tablet Take 1 tablet (10 mg total) by mouth 3 (three) times  daily.    mupirocin  ointment (BACTROBAN ) 2 % Apply topically 2 (two) times daily.    nitroGLYCERIN  (NITRODUR - DOSED IN MG/24 HR) 0.2 mg/hr patch Place 1 patch (0.2 mg total) onto the skin daily. (Patient not taking: Reported on 11/14/2023)    nitroGLYCERIN  (NITROSTAT ) 0.4 MG SL tablet Place 1 tablet (0.4 mg total) under the tongue every 5 (five) minutes as needed for chest pain.    polyethylene glycol (MIRALAX  / GLYCOLAX ) 17 g packet Take 17 g by mouth daily.    prochlorperazine  (COMPAZINE ) 10 MG tablet Take 1 tablet (10 mg total) by mouth every 6 (six) hours as needed for nausea or vomiting. 07/19/2023: Out of this medication, needs refill.   promethazine  (PHENERGAN ) 25 MG tablet Take 25 mg by mouth daily as needed for nausea or vomiting.    sertraline  (ZOLOFT ) 100 MG tablet Take 2 tablets (200 mg total) by mouth at bedtime.    traZODone (DESYREL) 50 MG tablet Take 50 mg by mouth at bedtime.    No facility-administered encounter medications on file as of 01/17/2024.    ALLERGIES:  Allergies  Allergen Reactions   Prilosec Otc [Omeprazole Magnesium ] Swelling    Face swells, no breathing impairment   Bee Venom Swelling   Morphine      Itching at IV site,  N+V   Nexium [Esomeprazole] Swelling    Face swells, no breathing impairment    LABORATORY DATA:  I have reviewed the labs as listed.  CBC    Component Value Date/Time   WBC 14.1 (H) 07/20/2023 0758   RBC 4.08 (L) 07/20/2023 0758   HGB 12.4 (L) 07/20/2023 0758   HCT 37.1 (L) 07/20/2023 0758   PLT 297 07/20/2023 0758   MCV 90.9 07/20/2023 0758   MCH 30.4 07/20/2023 0758   MCHC 33.4 07/20/2023 0758   RDW 12.8 07/20/2023 0758   LYMPHSABS 2.4 06/13/2023 1805   MONOABS 0.7 06/13/2023 1805   EOSABS 0.3 06/13/2023 1805   BASOSABS 0.1 06/13/2023 1805      Latest Ref Rng & Units 07/20/2023    7:58 AM 06/20/2023    5:32 AM 06/13/2023    6:05 PM  CMP  Glucose 70 - 99 mg/dL 836  841  893   BUN 6 - 20 mg/dL 26  26  21    Creatinine  0.61 - 1.24 mg/dL 7.85  8.39  8.67   Sodium 135 - 145 mmol/L 133  140  139   Potassium 3.5 - 5.1 mmol/L 4.3  5.1  4.4   Chloride 98 - 111 mmol/L 99  102  106   CO2 22 - 32 mmol/L 24   24   Calcium  8.9 - 10.3 mg/dL 8.1   8.7     DIAGNOSTIC IMAGING:  I have independently reviewed the relevant imaging and discussed with the patient.   WRAP UP:  All questions were answered. The patient knows to call the clinic with any problems, questions or concerns.  Medical decision making: Low***  Time spent on visit: I spent 15 minutes counseling the patient face to face. The total time spent in the appointment was 22 minutes and more than 50% was on counseling.  Pleasant CHRISTELLA Barefoot, PA-C  ***

## 2024-01-17 ENCOUNTER — Inpatient Hospital Stay: Payer: MEDICAID

## 2024-01-17 ENCOUNTER — Inpatient Hospital Stay: Payer: MEDICAID | Attending: Physician Assistant | Admitting: Physician Assistant

## 2024-01-17 VITALS — BP 134/80 | HR 87 | Temp 97.3°F | Resp 18 | Ht 68.0 in | Wt 154.0 lb

## 2024-01-17 DIAGNOSIS — Z794 Long term (current) use of insulin: Secondary | ICD-10-CM | POA: Insufficient documentation

## 2024-01-17 DIAGNOSIS — G4730 Sleep apnea, unspecified: Secondary | ICD-10-CM | POA: Diagnosis not present

## 2024-01-17 DIAGNOSIS — Z89422 Acquired absence of other left toe(s): Secondary | ICD-10-CM | POA: Diagnosis not present

## 2024-01-17 DIAGNOSIS — Z7902 Long term (current) use of antithrombotics/antiplatelets: Secondary | ICD-10-CM | POA: Insufficient documentation

## 2024-01-17 DIAGNOSIS — J4489 Other specified chronic obstructive pulmonary disease: Secondary | ICD-10-CM | POA: Diagnosis not present

## 2024-01-17 DIAGNOSIS — E1151 Type 2 diabetes mellitus with diabetic peripheral angiopathy without gangrene: Secondary | ICD-10-CM | POA: Insufficient documentation

## 2024-01-17 DIAGNOSIS — R700 Elevated erythrocyte sedimentation rate: Secondary | ICD-10-CM | POA: Insufficient documentation

## 2024-01-17 DIAGNOSIS — I10 Essential (primary) hypertension: Secondary | ICD-10-CM | POA: Insufficient documentation

## 2024-01-17 DIAGNOSIS — Z79899 Other long term (current) drug therapy: Secondary | ICD-10-CM | POA: Diagnosis not present

## 2024-01-17 DIAGNOSIS — E785 Hyperlipidemia, unspecified: Secondary | ICD-10-CM | POA: Insufficient documentation

## 2024-01-17 DIAGNOSIS — I2510 Atherosclerotic heart disease of native coronary artery without angina pectoris: Secondary | ICD-10-CM | POA: Insufficient documentation

## 2024-01-17 DIAGNOSIS — F17210 Nicotine dependence, cigarettes, uncomplicated: Secondary | ICD-10-CM | POA: Diagnosis not present

## 2024-01-17 DIAGNOSIS — D72828 Other elevated white blood cell count: Secondary | ICD-10-CM

## 2024-01-17 DIAGNOSIS — E1142 Type 2 diabetes mellitus with diabetic polyneuropathy: Secondary | ICD-10-CM | POA: Diagnosis not present

## 2024-01-17 DIAGNOSIS — M1990 Unspecified osteoarthritis, unspecified site: Secondary | ICD-10-CM | POA: Diagnosis not present

## 2024-01-17 DIAGNOSIS — Z803 Family history of malignant neoplasm of breast: Secondary | ICD-10-CM | POA: Diagnosis not present

## 2024-01-17 LAB — CBC WITH DIFFERENTIAL/PLATELET
Abs Immature Granulocytes: 0.04 K/uL (ref 0.00–0.07)
Basophils Absolute: 0.1 K/uL (ref 0.0–0.1)
Basophils Relative: 1 %
Eosinophils Absolute: 0.3 K/uL (ref 0.0–0.5)
Eosinophils Relative: 2 %
HCT: 37.4 % — ABNORMAL LOW (ref 39.0–52.0)
Hemoglobin: 12.3 g/dL — ABNORMAL LOW (ref 13.0–17.0)
Immature Granulocytes: 0 %
Lymphocytes Relative: 13 %
Lymphs Abs: 1.9 K/uL (ref 0.7–4.0)
MCH: 31.3 pg (ref 26.0–34.0)
MCHC: 32.9 g/dL (ref 30.0–36.0)
MCV: 95.2 fL (ref 80.0–100.0)
Monocytes Absolute: 0.6 K/uL (ref 0.1–1.0)
Monocytes Relative: 4 %
Neutro Abs: 11.7 K/uL — ABNORMAL HIGH (ref 1.7–7.7)
Neutrophils Relative %: 80 %
Platelets: 249 K/uL (ref 150–400)
RBC: 3.93 MIL/uL — ABNORMAL LOW (ref 4.22–5.81)
RDW: 13.3 % (ref 11.5–15.5)
WBC: 14.7 K/uL — ABNORMAL HIGH (ref 4.0–10.5)
nRBC: 0 % (ref 0.0–0.2)

## 2024-01-17 NOTE — Patient Instructions (Signed)
 [facility] at [facility] **VISIT SUMMARY & IMPORTANT INSTRUCTIONS **   You were seen today by Pleasant Barefoot PA-C for your elevated white blood cells.    Based on the labs that we checked, your white blood cell count does not show any signs of blood cancer.    We suspect that your white blood cells are elevated due to inflammation from smoking, diabetes, and chronic disease, as well as related to your Symbicort  inhaler.    You do not need any further testing or appointments at the hematology clinic [facility]).  You should continue to follow-up with your primary care doctor and should have your blood counts checked at least once per year.  Your primary care doctor can refer you back to see us  in the future if needed if you have persistently elevated white blood cells > 20 (other than during active infections), or if you have any elevation in blood cells with unexplained fever, chills, night sweats, or weight loss.   - - - - - - - - - - - - - - - - - -    Thank you for choosing [facility] at [facility] to provide your oncology and hematology care.  To afford each patient quality time with our provider, please arrive at least 15 minutes before your scheduled appointment time.   If you have a lab appointment with the Cancer Center please come in thru the Main Entrance and check in at the main information desk.  You need to re-schedule your appointment should you arrive 10 or more minutes late.  We strive to give you quality time with our providers, and arriving late affects you and other patients whose appointments are after yours.  Also, if you no show three or more times for appointments you may be dismissed from the clinic at the providers discretion.     Again, thank you for choosing [facility].  Our hope is that these requests will decrease the amount of time that you wait before being seen by our physicians.        _____________________________________________________________  Should you have questions after your visit to [facility], please contact our office at [phone] and follow the prompts.  Our office hours are 8:00 a.m. and 4:30 p.m. Monday - Friday.  Please note that voicemails left after 4:00 p.m. may not be returned until the following business day.  We are closed weekends and major holidays.  You do have access to a nurse 24-7, just call the main number to the clinic [phone] and do not press any options, hold on the line and a nurse will answer the phone.    For prescription refill requests, have your pharmacy contact our office and allow 72 hours.

## 2024-01-27 ENCOUNTER — Encounter: Payer: Self-pay | Admitting: Vascular Surgery

## 2024-01-27 ENCOUNTER — Ambulatory Visit (HOSPITAL_COMMUNITY)
Admission: RE | Admit: 2024-01-27 | Discharge: 2024-01-27 | Disposition: A | Discharge status: Home / Self Care | Payer: MEDICAID | Source: Ambulatory Visit | Attending: Vascular Surgery | Admitting: Vascular Surgery

## 2024-01-27 ENCOUNTER — Ambulatory Visit (HOSPITAL_BASED_OUTPATIENT_CLINIC_OR_DEPARTMENT_OTHER)
Admission: RE | Admit: 2024-01-27 | Discharge: 2024-01-27 | Disposition: A | Discharge status: Home / Self Care | Payer: MEDICAID | Source: Ambulatory Visit | Attending: Vascular Surgery | Admitting: Vascular Surgery

## 2024-01-27 ENCOUNTER — Ambulatory Visit: Payer: MEDICAID | Admitting: Vascular Surgery

## 2024-01-27 VITALS — BP 123/85 | HR 92 | Temp 97.8°F | Ht 68.0 in | Wt 137.6 lb

## 2024-01-27 DIAGNOSIS — I70222 Atherosclerosis of native arteries of extremities with rest pain, left leg: Secondary | ICD-10-CM | POA: Insufficient documentation

## 2024-01-27 DIAGNOSIS — I739 Peripheral vascular disease, unspecified: Secondary | ICD-10-CM | POA: Insufficient documentation

## 2024-01-27 NOTE — Progress Notes (Signed)
 Patient ID: [name] [name], male   DOB: 10-16-1970, 53 y.o.   MRN: [id number]  Reason for Consult: No chief complaint on file.   Referred by [name], PA-C  Subjective:     HPI [name] [name] is a 53 y.o. male presenting for follow-up of PAD.  He underwent DCB of left popliteal artery on 06/28/2023.  He had left fifth toe gangrene and then underwent left fourth and fifth toe amputation by Dr. Harden. He was last seen in May when he was noted to have normal ABIs and toe pressures with an approximate 50 to 74% residual stenosis in the popliteal.  Today he reports he has neuropathy with numbness in the foot although he is walking without any pain in the calf or toes.  He denies rest pain or nonhealing wounds.  His toe amputation sites of healed well.  Past Medical History:  Diagnosis Date   Anxiety    Arthritis    Asthma    CAD (coronary artery disease)    a. 03/2015 Cath: LM nl, LAD mild diff dzs throughout w/ 50p/d, LCX diff dzs, 29m, RCA diff dzs, 44m-->Med rx; b. 12/2020 MV: EF 57%, small/mild apical inferior defect w/ partial rev/mild ischemia.   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, bilateral    Sleep apnea 2016   does not use CPAP   Type 2 diabetes mellitus (HCC)    Family History  Problem Relation Age of Onset   Diabetes Mother    Hypertension Mother    Cancer Mother    Throat cancer Mother    Diabetes Father    Hypertension Father    Hyperlipidemia Father    Congestive Heart Failure Sister    Colon cancer Neg Hx    Pancreatic cancer Neg Hx    Liver disease Neg Hx    Past Surgical History:  Procedure Laterality  Date   ABDOMINAL AORTOGRAM W/LOWER EXTREMITY N/A 06/20/2023   Procedure: ABDOMINAL AORTOGRAM W/LOWER EXTREMITY;  Surgeon: Pearline Norman RAMAN, MD;  Location: [facility];  Service: Cardiovascular;  Laterality: N/A;   AMPUTATION Left 06/24/2023   Procedure: LEFT 5TH TOE AMPUTATION;  Surgeon: Harden Jerona GAILS, MD;  Location: [facility] OR;  Service: Orthopedics;  Laterality: Left;   AMPUTATION Left 07/20/2023   Procedure: LEFT FOOT 4TH AND 5TH RAY AMPUTATION;  Surgeon: Harden Jerona GAILS, MD;  Location: [facility] OR;  Service: Orthopedics;  Laterality: Left;   ANKLE SURGERY Right 1982   had extra bones in there; took them out   APPENDECTOMY  1975   BIOPSY  12/26/2018   Procedure: BIOPSY;  Surgeon: Golda Claudis PENNER, MD;  Location: [facility];  Service: Endoscopy;;  duodenal biopsies   BIOPSY  03/23/2019   Procedure: BIOPSY;  Surgeon: Golda Claudis PENNER, MD;  Location: [facility];  Service: Endoscopy;;  esophagus   CARDIAC CATHETERIZATION N/A 03/28/2015   Procedure: Left Heart Cath and Coronary Angiography;  Surgeon: Gordy Bergamo, MD;  Location: [facility] INVASIVE CV LAB;  Service: Cardiovascular;  Laterality: N/A;   CARDIAC CATHETERIZATION  N/A 03/28/2015   Procedure: Intravascular Pressure Wire/FFR Study;  Surgeon: Gordy Bergamo, MD;  Location: [facility] INVASIVE CV LAB;  Service: Cardiovascular;  Laterality: N/A;   CARPAL TUNNEL RELEASE Left ~ 2008   COLONOSCOPY WITH ESOPHAGOGASTRODUODENOSCOPY (EGD)     COLONOSCOPY WITH PROPOFOL  N/A 12/24/2019   Procedure: COLONOSCOPY WITH PROPOFOL ;  Surgeon: Golda Claudis PENNER, MD;  Location: [facility];  Service: Endoscopy;  Laterality: N/A;  955   ELBOW FRACTURE SURGERY Left ~ 2008   ESOPHAGEAL MANOMETRY N/A 09/16/2021   Procedure: ESOPHAGEAL MANOMETRY (EM);  Surgeon: San Sandor GAILS, DO;  Location: [facility];  Service: Endoscopy;  Laterality: N/A;   ESOPHAGOGASTRODUODENOSCOPY N/A 12/26/2018   Procedure: ESOPHAGOGASTRODUODENOSCOPY (EGD);  Surgeon: Golda Claudis PENNER, MD;  Location: [facility];  Service: Endoscopy;  Laterality: N/A;   ESOPHAGOGASTRODUODENOSCOPY (EGD) WITH PROPOFOL  N/A 03/23/2019   Procedure: ESOPHAGOGASTRODUODENOSCOPY (EGD) WITH PROPOFOL ;  Surgeon: Golda Claudis PENNER, MD;  Location: [facility];  Service: Endoscopy;  Laterality: N/A;  7:30   FRACTURE SURGERY     ankle and elbow   PERIPHERAL VASCULAR BALLOON ANGIOPLASTY  06/20/2023   Procedure: PERIPHERAL VASCULAR BALLOON ANGIOPLASTY;  Surgeon: Pearline Norman RAMAN, MD;  Location: [facility];  Service: Cardiovascular;;   PH IMPEDANCE STUDY N/A 09/16/2021   Procedure: PH IMPEDANCE STUDY;  Surgeon: San Sandor GAILS, DO;  Location: [facility];  Service: Endoscopy;  Laterality: N/A;   PILONIDAL CYST EXCISION N/A 03/24/2017   Procedure: EXCISION CHRONIC  PILONIDAL ABSCESS;  Surgeon: Vernetta Berg, MD;  Location: [facility] ORS;  Service: General;  Laterality: N/A;   SCROTAL EXPLORATION N/A 11/13/2020   Procedure: EXCISION OF SEBACEOUS CYSTS, SCROTUM;  Surgeon: Sherrilee Belvie CROME, MD;  Location: [facility];  Service: Urology;  Laterality: N/A;   TENDON REPAIR Left ~ 2004   main tendon in my ankle    Short Social History:  Social History   Tobacco Use   Smoking status: Every Day    Current packs/day: 1.00    Average packs/day: 1 pack/day for 34.0 years (34.0 ttl pk-yrs)    Types: Cigarettes    Passive exposure: Never   Smokeless tobacco: Never   Tobacco comments:    1/2 ppd -1 ppd - patient states trying to cut back as of 06/18/23 km  Substance Use Topics   Alcohol use: Never    Alcohol/week: 0.0 standard drinks of alcohol    Allergies  Allergen Reactions   Prilosec Otc [Omeprazole Magnesium ] Swelling    Face swells, no breathing impairment   Bee Venom Swelling   Morphine      Itching at IV site,  N+V   Nexium [Esomeprazole] Swelling    Face swells, no breathing impairment    Current Outpatient Medications  Medication Sig Dispense Refill   Accu-Chek FastClix Lancets MISC Check blood sugar 3  times daily 100 each 11   acetaminophen  (TYLENOL ) 500 MG tablet Take 1,000 mg by mouth 2 (two) times daily as needed for moderate pain, fever or headache.     albuterol  (VENTOLIN  HFA) 108 (90 Base) MCG/ACT inhaler Inhale 2 puffs into the lungs every 6 (six) hours as needed for wheezing or shortness of breath.     ALPRAZolam  (XANAX ) 1 MG tablet Take 1 mg by mouth at bedtime as needed for anxiety or sleep.     amLODipine (NORVASC) 5 MG tablet Take 5 mg by mouth every evening.     ARIPiprazole  (ABILIFY ) 10 MG tablet Take 10 mg by mouth in the morning.  aspirin  EC 81 MG tablet Take 81 mg by mouth daily with breakfast.     atorvastatin  (LIPITOR ) 80 MG tablet Take 1 tablet (80 mg total) by mouth at bedtime. 90 tablet 3   Blood Glucose Monitoring Suppl (ACCU-CHEK GUIDE) w/Device KIT Use to check blood sugar 3 times daily 1 kit 0   budesonide -formoterol  (SYMBICORT ) 80-4.5 MCG/ACT inhaler Take 2 puffs first thing in am and then another 2 puffs about 12 hours later. (Patient taking differently: Inhale 2 puffs into the lungs 2 (two) times daily as needed (wheeze, shortness of breath).) 1 each 12   clopidogrel  (PLAVIX ) 75 MG tablet Take 1 tablet (75 mg total) by mouth daily. 30 tablet 11   Continuous Glucose Sensor (FREESTYLE LIBRE 2 PLUS SENSOR) MISC 1 Device by Does not apply route every 14 (fourteen) days. 6 each 3   Continuous Glucose Sensor (FREESTYLE LIBRE 2 SENSOR) MISC Change sensors every 14 days 6 each 3   Continuous Glucose Sensor (FREESTYLE LIBRE 3 PLUS SENSOR) MISC 1 Device by Other route every 14 (fourteen) days. Change sensor every 15 days. 6 each 3   doxycycline  (VIBRA -TABS) 100 MG tablet TAKE 1 TABLET BY MOUTH TWICE A DAY 20 tablet 0   ezetimibe  (ZETIA ) 10 MG tablet Take 1 tablet (10 mg total) by mouth daily. 90 tablet 3   famotidine  (PEPCID ) 40 MG tablet Take 1 tablet (40 mg total) by mouth 2 (two) times daily. 60 tablet 3   gabapentin  (NEURONTIN ) 300 MG capsule Take 1 capsule (300 mg  total) by mouth every 8 (eight) hours as needed. 30 capsule 0   glucose blood (ACCU-CHEK GUIDE) test strip Check blood sugar 3 times daily 100 each 12   glucose blood (TRUE METRIX BLOOD GLUCOSE TEST) test strip Check blood sugar 3 times daily 100 each 12   HYDROcodone -acetaminophen  (NORCO/VICODIN) 5-325 MG tablet Take 1 tablet by mouth every 6 (six) hours as needed. 30 tablet 0   insulin  aspart (NOVOLOG  FLEXPEN) 100 UNIT/ML FlexPen Start CF: NovoLog  (BG-130/45) TIDQAC  Max daily 30 units (Patient taking differently: Inject 0-7 Units into the skin 3 (three) times daily before meals. Start CF: NovoLog  (BG-130/45) TIDQAC  Max daily 30 units) 30 mL 3   insulin  glargine (LANTUS  SOLOSTAR) 100 UNIT/ML Solostar Pen Inject 12 Units into the skin daily. 15 mL 11   Insulin  Pen Needle 32G X 4 MM MISC 1 Device by Does not apply route in the morning, at noon, in the evening, and at bedtime. 400 each 3   linaclotide  (LINZESS ) 145 MCG CAPS capsule Take 1 capsule (145 mcg total) by mouth daily before breakfast. 90 capsule 3   midodrine  (PROAMATINE ) 10 MG tablet Take 1 tablet (10 mg total) by mouth 3 (three) times daily. 270 tablet 3   mupirocin  ointment (BACTROBAN ) 2 % Apply topically 2 (two) times daily.     nitroGLYCERIN  (NITRODUR - DOSED IN MG/24 HR) 0.2 mg/hr patch Place 1 patch (0.2 mg total) onto the skin daily. 30 patch 12   nitroGLYCERIN  (NITROSTAT ) 0.4 MG SL tablet Place 1 tablet (0.4 mg total) under the tongue every 5 (five) minutes as needed for chest pain. 25 tablet 3   polyethylene glycol (MIRALAX  / GLYCOLAX ) 17 g packet Take 17 g by mouth daily. 14 each 0   prochlorperazine  (COMPAZINE ) 10 MG tablet Take 1 tablet (10 mg total) by mouth every 6 (six) hours as needed for nausea or vomiting. 20 tablet 0   promethazine  (PHENERGAN ) 25 MG tablet Take 25  mg by mouth daily as needed for nausea or vomiting.     sertraline  (ZOLOFT ) 100 MG tablet Take 2 tablets (200 mg total) by mouth at bedtime.     traZODone  (DESYREL) 50 MG tablet Take 50 mg by mouth at bedtime.     No current facility-administered medications for this visit.    REVIEW OF SYSTEMS  All other systems were reviewed and are negative     Objective:  Objective   There were no vitals filed for this visit. There is no height or weight on file to calculate BMI.  Physical Exam General: no acute distress Cardiac: hemodynamically stable Pulm: normal work of breathing Neuro: alert, no focal deficit Extremities: Well-healed to left 4th and 5th toe amputation sites Vascular:   Right: Palpable PT  Left: Palpable DP  Data: ABI +---------+------------------+-----+----------+--------+  Right   Rt Pressure (mmHg)IndexWaveform  Comment   +---------+------------------+-----+----------+--------+  Brachial 140                                        +---------+------------------+-----+----------+--------+  PTA     155               1.10 monophasicbrisk     +---------+------------------+-----+----------+--------+  DP      154               1.09 biphasic            +---------+------------------+-----+----------+--------+  Great Toe115               0.82 Normal              +---------+------------------+-----+----------+--------+   +---------+------------------+-----+----------+-------+  Left    Lt Pressure (mmHg)IndexWaveform  Comment  +---------+------------------+-----+----------+-------+  Brachial 141                                       +---------+------------------+-----+----------+-------+  PTA     159               1.13 monophasicbrisk    +---------+------------------+-----+----------+-------+  DP      165               1.17 biphasic           +---------+------------------+-----+----------+-------+  Great Toe113               0.80 Normal             +---------+------------------+-----+----------+-------+    Duplex +-----------+--------+-----+---------------+----------+-----------------+  LEFT      PSV cm/sRatioStenosis       Waveform  Comments           +-----------+--------+-----+---------------+----------+-----------------+  CFA Prox   103                         triphasic                    +-----------+--------+-----+---------------+----------+-----------------+  CFA Distal 98                          triphasic                    +-----------+--------+-----+---------------+----------+-----------------+  DFA       138  biphasic                     +-----------+--------+-----+---------------+----------+-----------------+  SFA Prox   201          50-74% stenosisbiphasic  homogenous plaque  +-----------+--------+-----+---------------+----------+-----------------+  SFA Mid    126                         biphasic                     +-----------+--------+-----+---------------+----------+-----------------+  SFA Distal 52                          biphasic                     +-----------+--------+-----+---------------+----------+-----------------+  POP Prox   150                         biphasic                     +-----------+--------+-----+---------------+----------+-----------------+  POP Mid    123                         biphasic                     +-----------+--------+-----+---------------+----------+-----------------+  POP Distal 119                         biphasic                     +-----------+--------+-----+---------------+----------+-----------------+  TP Trunk   94                          biphasic                     +-----------+--------+-----+---------------+----------+-----------------+  ATA Distal 46                          biphasic                     +-----------+--------+-----+---------------+----------+-----------------+  PTA Distal 18                           monophasic                   +-----------+--------+-----+---------------+----------+-----------------+  PERO Distal40                          biphasic                     +-----------+--------+-----+---------------+----------+-----------------+   BMP reviewed, creatinine 2.1     Assessment/Plan:   AMAURIE WANDEL is a 53 y.o. male with PAD and CL TI who underwent left popliteal drug-coated balloon angioplasty on 06/28/2023 and then underwent 4th and 5th toe amputation by Dr. Harden. Today's studies look great and he has palpable pedal pulses.  He has ABI of 1.1 bilaterally with toe pressures greater than 100. We discussed continuing to walk is much as possible and plan to continue with aspirin , Plavix  and statin. Follow-up in 12 months with ABI  and left leg duplex  Recommendations to optimize cardiovascular risk: Abstinence from all tobacco products. Blood glucose control with goal A1c < 7%. Blood pressure control with goal blood pressure < 140/90 mmHg. Lipid reduction therapy with goal LDL-C <100 mg/dL  Aspirin  81mg  PO QD.  Atorvastatin  40-80mg  PO QD (or other high intensity statin therapy).   Norman GORMAN Serve MD [facility]

## 2024-01-28 LAB — VAS US ABI WITH/WO TBI
Left ABI: 1.17
Right ABI: 1.1

## 2024-02-04 ENCOUNTER — Inpatient Hospital Stay (HOSPITAL_COMMUNITY): Payer: MEDICAID

## 2024-02-04 ENCOUNTER — Encounter (HOSPITAL_COMMUNITY): Payer: Self-pay | Admitting: Family Medicine

## 2024-02-04 ENCOUNTER — Observation Stay (HOSPITAL_COMMUNITY)
Admission: EM | Admit: 2024-02-04 | Discharge: 2024-02-06 | Disposition: A | Discharge status: Home / Self Care | Payer: MEDICAID | Attending: Family Medicine | Admitting: Family Medicine

## 2024-02-04 ENCOUNTER — Emergency Department (HOSPITAL_COMMUNITY): Payer: MEDICAID

## 2024-02-04 ENCOUNTER — Other Ambulatory Visit: Payer: Self-pay

## 2024-02-04 DIAGNOSIS — S42211A Unspecified displaced fracture of surgical neck of right humerus, initial encounter for closed fracture: Secondary | ICD-10-CM | POA: Diagnosis not present

## 2024-02-04 DIAGNOSIS — N179 Acute kidney failure, unspecified: Secondary | ICD-10-CM | POA: Diagnosis not present

## 2024-02-04 DIAGNOSIS — F17210 Nicotine dependence, cigarettes, uncomplicated: Secondary | ICD-10-CM | POA: Diagnosis not present

## 2024-02-04 DIAGNOSIS — J449 Chronic obstructive pulmonary disease, unspecified: Secondary | ICD-10-CM | POA: Diagnosis not present

## 2024-02-04 DIAGNOSIS — I739 Peripheral vascular disease, unspecified: Secondary | ICD-10-CM | POA: Diagnosis not present

## 2024-02-04 DIAGNOSIS — K219 Gastro-esophageal reflux disease without esophagitis: Secondary | ICD-10-CM | POA: Diagnosis not present

## 2024-02-04 DIAGNOSIS — I25118 Atherosclerotic heart disease of native coronary artery with other forms of angina pectoris: Secondary | ICD-10-CM | POA: Diagnosis not present

## 2024-02-04 DIAGNOSIS — E782 Mixed hyperlipidemia: Secondary | ICD-10-CM | POA: Diagnosis not present

## 2024-02-04 DIAGNOSIS — R55 Syncope and collapse: Secondary | ICD-10-CM | POA: Diagnosis not present

## 2024-02-04 DIAGNOSIS — E1142 Type 2 diabetes mellitus with diabetic polyneuropathy: Secondary | ICD-10-CM | POA: Diagnosis not present

## 2024-02-04 DIAGNOSIS — I10 Essential (primary) hypertension: Secondary | ICD-10-CM | POA: Diagnosis not present

## 2024-02-04 DIAGNOSIS — Z794 Long term (current) use of insulin: Secondary | ICD-10-CM | POA: Diagnosis not present

## 2024-02-04 DIAGNOSIS — Z7982 Long term (current) use of aspirin: Secondary | ICD-10-CM | POA: Diagnosis not present

## 2024-02-04 DIAGNOSIS — N289 Disorder of kidney and ureter, unspecified: Secondary | ICD-10-CM | POA: Insufficient documentation

## 2024-02-04 DIAGNOSIS — Z7901 Long term (current) use of anticoagulants: Secondary | ICD-10-CM | POA: Diagnosis not present

## 2024-02-04 DIAGNOSIS — Z79899 Other long term (current) drug therapy: Secondary | ICD-10-CM | POA: Diagnosis not present

## 2024-02-04 DIAGNOSIS — F419 Anxiety disorder, unspecified: Secondary | ICD-10-CM | POA: Diagnosis not present

## 2024-02-04 DIAGNOSIS — D72829 Elevated white blood cell count, unspecified: Secondary | ICD-10-CM | POA: Diagnosis present

## 2024-02-04 DIAGNOSIS — W228XXA Striking against or struck by other objects, initial encounter: Secondary | ICD-10-CM | POA: Insufficient documentation

## 2024-02-04 DIAGNOSIS — S42294A Other nondisplaced fracture of upper end of right humerus, initial encounter for closed fracture: Secondary | ICD-10-CM

## 2024-02-04 DIAGNOSIS — S42213A Unspecified displaced fracture of surgical neck of unspecified humerus, initial encounter for closed fracture: Secondary | ICD-10-CM | POA: Diagnosis present

## 2024-02-04 DIAGNOSIS — I4510 Unspecified right bundle-branch block: Secondary | ICD-10-CM | POA: Diagnosis not present

## 2024-02-04 DIAGNOSIS — E119 Type 2 diabetes mellitus without complications: Secondary | ICD-10-CM

## 2024-02-04 LAB — URINALYSIS, ROUTINE W REFLEX MICROSCOPIC
Bacteria, UA: NONE SEEN
Bilirubin Urine: NEGATIVE
Glucose, UA: 150 mg/dL — AB
Ketones, ur: NEGATIVE mg/dL
Leukocytes,Ua: NEGATIVE
Nitrite: NEGATIVE
Protein, ur: >=300 mg/dL — AB
Specific Gravity, Urine: 1.016 (ref 1.005–1.030)
pH: 5 (ref 5.0–8.0)

## 2024-02-04 LAB — COMPREHENSIVE METABOLIC PANEL WITH GFR
ALT: 14 U/L (ref 0–44)
AST: 15 U/L (ref 15–41)
Albumin: 3 g/dL — ABNORMAL LOW (ref 3.5–5.0)
Alkaline Phosphatase: 97 U/L (ref 38–126)
Anion gap: 13 (ref 5–15)
BUN: 32 mg/dL — ABNORMAL HIGH (ref 6–20)
CO2: 23 mmol/L (ref 22–32)
Calcium: 8.4 mg/dL — ABNORMAL LOW (ref 8.9–10.3)
Chloride: 99 mmol/L (ref 98–111)
Creatinine, Ser: 2 mg/dL — ABNORMAL HIGH (ref 0.61–1.24)
GFR, Estimated: 39 mL/min — ABNORMAL LOW (ref >60)
Glucose, Bld: 274 mg/dL — ABNORMAL HIGH (ref 70–99)
Potassium: 4.7 mmol/L (ref 3.5–5.1)
Sodium: 135 mmol/L (ref 135–145)
Total Bilirubin: 1.5 mg/dL — ABNORMAL HIGH (ref 0.0–1.2)
Total Protein: 6.2 g/dL — ABNORMAL LOW (ref 6.5–8.1)

## 2024-02-04 LAB — GLUCOSE, CAPILLARY
Glucose-Capillary: 165 mg/dL — ABNORMAL HIGH (ref 70–99)
Glucose-Capillary: 131 mg/dL — ABNORMAL HIGH (ref 70–99)

## 2024-02-04 LAB — CBC
HCT: 37 % — ABNORMAL LOW (ref 39.0–52.0)
Hemoglobin: 12.4 g/dL — ABNORMAL LOW (ref 13.0–17.0)
MCH: 31.4 pg (ref 26.0–34.0)
MCHC: 33.5 g/dL (ref 30.0–36.0)
MCV: 93.7 fL (ref 80.0–100.0)
Platelets: 232 K/uL (ref 150–400)
RBC: 3.95 MIL/uL — ABNORMAL LOW (ref 4.22–5.81)
RDW: 12.9 % (ref 11.5–15.5)
WBC: 19.7 K/uL — ABNORMAL HIGH (ref 4.0–10.5)
nRBC: 0 % (ref 0.0–0.2)

## 2024-02-04 LAB — TROPONIN I (HIGH SENSITIVITY)
Troponin I (High Sensitivity): 18 ng/L — ABNORMAL HIGH (ref <18)
Troponin I (High Sensitivity): 17 ng/L (ref <18)

## 2024-02-04 LAB — HEMOGLOBIN A1C
Hgb A1c MFr Bld: 8.6 % — ABNORMAL HIGH (ref 4.8–5.6)
Mean Plasma Glucose: 200.12 mg/dL

## 2024-02-04 LAB — HIV ANTIBODY (ROUTINE TESTING W REFLEX): HIV Screen 4th Generation wRfx: NONREACTIVE

## 2024-02-04 LAB — LACTIC ACID, PLASMA: Lactic Acid, Venous: 1.4 mmol/L (ref 0.5–1.9)

## 2024-02-04 MED ORDER — ACETAMINOPHEN 650 MG RE SUPP: 650.0000 mg | Freq: Four times a day (QID) | RECTAL | Status: DC | PRN

## 2024-02-04 MED ORDER — HYDROMORPHONE HCL 1 MG/ML IJ SOLN
1.0000 mg | Freq: Once | INTRAMUSCULAR | Status: AC
  Administered 2024-02-04: 1 mg via INTRAVENOUS
  Filled 2024-02-04: qty 1

## 2024-02-04 MED ORDER — TECHNETIUM TO 99M ALBUMIN AGGREGATED
4.6000 | Freq: Once | INTRAVENOUS | Status: AC | PRN
  Administered 2024-02-04: 4.4 via INTRAVENOUS

## 2024-02-04 MED ORDER — SODIUM CHLORIDE 0.9 % IV BOLUS
1000.0000 mL | Freq: Once | INTRAVENOUS | Status: AC
  Administered 2024-02-04: 1000 mL via INTRAVENOUS

## 2024-02-04 MED ORDER — SODIUM CHLORIDE 0.9% FLUSH
3.0000 mL | Freq: Two times a day (BID) | INTRAVENOUS | Status: DC
  Administered 2024-02-04 – 2024-02-06 (×2): 3 mL via INTRAVENOUS

## 2024-02-04 MED ORDER — ASPIRIN 81 MG PO TBEC
81.0000 mg | DELAYED_RELEASE_TABLET | Freq: Every day | ORAL | Status: DC
  Administered 2024-02-05 – 2024-02-06 (×2): 81 mg via ORAL
  Filled 2024-02-04 (×2): qty 1

## 2024-02-04 MED ORDER — IPRATROPIUM BROMIDE 0.02 % IN SOLN: 0.5000 mg | Freq: Four times a day (QID) | RESPIRATORY_TRACT | Status: DC | PRN

## 2024-02-04 MED ORDER — HYDRALAZINE HCL 20 MG/ML IJ SOLN: 10.0000 mg | INTRAMUSCULAR | Status: DC | PRN

## 2024-02-04 MED ORDER — ATORVASTATIN CALCIUM 40 MG PO TABS
80.0000 mg | ORAL_TABLET | Freq: Every day | ORAL | Status: DC
  Administered 2024-02-04 – 2024-02-05 (×2): 80 mg via ORAL
  Filled 2024-02-04 (×2): qty 2

## 2024-02-04 MED ORDER — SENNOSIDES-DOCUSATE SODIUM 8.6-50 MG PO TABS: 1.0000 | ORAL_TABLET | Freq: Every evening | ORAL | Status: DC | PRN

## 2024-02-04 MED ORDER — INSULIN GLARGINE-YFGN 100 UNIT/ML ~~LOC~~ SOLN
12.0000 [IU] | Freq: Every day | SUBCUTANEOUS | Status: DC
  Administered 2024-02-04 – 2024-02-05 (×2): 12 [IU] via SUBCUTANEOUS
  Filled 2024-02-04 (×3): qty 0.12

## 2024-02-04 MED ORDER — HYDROMORPHONE HCL 1 MG/ML IJ SOLN
0.5000 mg | INTRAMUSCULAR | Status: DC | PRN
  Administered 2024-02-04 (×2): 1 mg via INTRAVENOUS
  Filled 2024-02-04 (×2): qty 1

## 2024-02-04 MED ORDER — SODIUM CHLORIDE 0.9 % IV SOLN: INTRAVENOUS | Status: DC

## 2024-02-04 MED ORDER — ARIPIPRAZOLE 10 MG PO TABS
10.0000 mg | ORAL_TABLET | Freq: Every morning | ORAL | Status: DC
  Administered 2024-02-05 – 2024-02-06 (×2): 10 mg via ORAL
  Filled 2024-02-04 (×2): qty 1

## 2024-02-04 MED ORDER — SODIUM CHLORIDE 0.9% FLUSH
3.0000 mL | Freq: Two times a day (BID) | INTRAVENOUS | Status: DC
  Administered 2024-02-04 – 2024-02-06 (×3): 3 mL via INTRAVENOUS

## 2024-02-04 MED ORDER — FLEET ENEMA RE ENEM
1.0000 | ENEMA | Freq: Once | RECTAL | Status: DC | PRN
Start: 2024-02-04 — End: 2024-02-07

## 2024-02-04 MED ORDER — BISACODYL 5 MG PO TBEC: 5.0000 mg | DELAYED_RELEASE_TABLET | Freq: Every day | ORAL | Status: DC | PRN

## 2024-02-04 MED ORDER — ONDANSETRON HCL 4 MG/2ML IJ SOLN
4.0000 mg | Freq: Once | INTRAMUSCULAR | Status: AC
  Administered 2024-02-04: 4 mg via INTRAVENOUS
  Filled 2024-02-04: qty 2

## 2024-02-04 MED ORDER — HEPARIN SODIUM (PORCINE) 5000 UNIT/ML IJ SOLN
5000.0000 [IU] | Freq: Three times a day (TID) | INTRAMUSCULAR | Status: DC
  Administered 2024-02-04 – 2024-02-06 (×6): 5000 [IU] via SUBCUTANEOUS
  Filled 2024-02-04 (×6): qty 1

## 2024-02-04 MED ORDER — ZOLPIDEM TARTRATE 5 MG PO TABS
5.0000 mg | ORAL_TABLET | Freq: Every evening | ORAL | Status: DC | PRN
  Administered 2024-02-05: 5 mg via ORAL
  Filled 2024-02-04: qty 1

## 2024-02-04 MED ORDER — ACETAMINOPHEN 325 MG PO TABS
650.0000 mg | ORAL_TABLET | Freq: Four times a day (QID) | ORAL | Status: DC | PRN
  Administered 2024-02-06: 650 mg via ORAL
  Filled 2024-02-04: qty 2

## 2024-02-04 MED ORDER — SODIUM CHLORIDE 0.9 % IV BOLUS
500.0000 mL | Freq: Once | INTRAVENOUS | Status: AC
  Administered 2024-02-04: 500 mL via INTRAVENOUS

## 2024-02-04 MED ORDER — INSULIN ASPART 100 UNIT/ML IJ SOLN
0.0000 [IU] | Freq: Three times a day (TID) | INTRAMUSCULAR | Status: DC
  Administered 2024-02-04 – 2024-02-05 (×2): 3 [IU] via SUBCUTANEOUS
  Administered 2024-02-06 (×2): 2 [IU] via SUBCUTANEOUS

## 2024-02-04 MED ORDER — GABAPENTIN 300 MG PO CAPS
300.0000 mg | ORAL_CAPSULE | Freq: Three times a day (TID) | ORAL | Status: DC
  Administered 2024-02-04 – 2024-02-06 (×6): 300 mg via ORAL
  Filled 2024-02-04 (×7): qty 1

## 2024-02-04 MED ORDER — ONDANSETRON HCL 4 MG PO TABS: 4.0000 mg | ORAL_TABLET | Freq: Four times a day (QID) | ORAL | Status: DC | PRN

## 2024-02-04 MED ORDER — OXYCODONE HCL 5 MG PO TABS
5.0000 mg | ORAL_TABLET | ORAL | Status: DC | PRN
  Administered 2024-02-04 – 2024-02-06 (×5): 5 mg via ORAL
  Filled 2024-02-04 (×5): qty 1

## 2024-02-04 MED ORDER — FAMOTIDINE 20 MG PO TABS
40.0000 mg | ORAL_TABLET | Freq: Every day | ORAL | Status: DC
  Administered 2024-02-04 – 2024-02-05 (×2): 40 mg via ORAL
  Filled 2024-02-04 (×3): qty 2

## 2024-02-04 MED ORDER — ONDANSETRON HCL 4 MG/2ML IJ SOLN: 4.0000 mg | Freq: Four times a day (QID) | INTRAMUSCULAR | Status: DC | PRN

## 2024-02-04 MED ORDER — CLOPIDOGREL BISULFATE 75 MG PO TABS
75.0000 mg | ORAL_TABLET | Freq: Every day | ORAL | Status: DC
  Administered 2024-02-04 – 2024-02-06 (×3): 75 mg via ORAL
  Filled 2024-02-04 (×3): qty 1

## 2024-02-04 MED ORDER — INSULIN ASPART 100 UNIT/ML IJ SOLN: 0.0000 [IU] | Freq: Every day | INTRAMUSCULAR | Status: DC

## 2024-02-04 MED ORDER — DOXYCYCLINE HYCLATE 100 MG PO TABS
100.0000 mg | ORAL_TABLET | Freq: Two times a day (BID) | ORAL | Status: DC
  Administered 2024-02-04 – 2024-02-06 (×4): 100 mg via ORAL
  Filled 2024-02-04 (×5): qty 1

## 2024-02-04 MED ORDER — LEVALBUTEROL HCL 0.63 MG/3ML IN NEBU: 0.6300 mg | INHALATION_SOLUTION | Freq: Four times a day (QID) | RESPIRATORY_TRACT | Status: DC | PRN

## 2024-02-04 NOTE — Assessment & Plan Note (Signed)
-   Afebrile, normotensive,  - Possible reactive -Ruling out infection, obtaining lactic acid, procalcitonin level - No source of infection-UA negative imaging within normal limits -Withholding antibiotics at this point

## 2024-02-04 NOTE — Assessment & Plan Note (Signed)
-   Elevated BUN/creatinine-close to baseline (1.6-2.0) -Ruling out CKD possibly stage II and a Lab Results  Component Value Date   CREATININE 2.00 (H) 02/04/2024   CREATININE 2.14 (H) 07/20/2023   CREATININE 1.60 (H) 06/20/2023

## 2024-02-04 NOTE — Assessment & Plan Note (Signed)
-   Not O2 dependent no signs of exacerbation continue as needed DuoNeb bronchodilators, home inhalers -

## 2024-02-04 NOTE — ED Triage Notes (Signed)
 Pt arrived POV with c/o right shoulder pain from a fall . Pt stated he got up to go to the bathroom and had a syncope episode. Pt stated he was out for about 1 minute. Pt stated he checked his sugar this morning and it was 238

## 2024-02-04 NOTE — Hospital Course (Signed)
 DAMIN SALIDO is a 53 year old male with extensive history of DM2, HTN, HLD, depression, anxiety, GERD, CAD, chronic bronchitis, COPD diabetic neuropathy, PVD,s/p amputation of fourth, fifth toe... Presented to the ED with chief complaint of right shoulder pain after having a syncopal episode.  Patient reports earlier this morning got up to go to the bathroom had a spontaneous passing out spell. Struck his head during the fall, but denies any neck or back pain.  Reports he might been out for maybe about a minute.  Checked his sugar was 238  Denies any recent illnesses, fever or chills, denies of having any asymmetric weakness.  Only on the right shoulder due to pain.  Denies any chest pain or shortness of breath or palpitation. Has no recollection of the event, states he did not notice loss of bowel or bladder function.   ED evaluation: Blood pressure (!) 143/85, pulse 85, temperature 97.9 F (36.6 C), RR 16, SpO2 99%.   Labs; WBC 19.7, hemoglobin 12.4, BUN 32, creatinine 2.00, troponin 18, 17, glucose 274 UA hazy, small hemoglobin-nitrites or ketones no bacteria, WBC 0-5 Imaging: CT head/cervical spine; no traumatic fractures, no abnormalities-chronic small vessel disease Right shoulder x-ray: Minimally impacted fracture of the right humeral surgical neck.

## 2024-02-04 NOTE — Assessment & Plan Note (Signed)
-   With history of toe amputations -Continuing aspirin , Plavix , statins, optimizing glycemic control

## 2024-02-04 NOTE — ED Provider Notes (Signed)
 [place] EMERGENCY DEPARTMENT AT [facility] Provider Note   CSN: [id number] Arrival date & time: 02/04/24  9149     Patient presents with: Fall and Loss of Consciousness   [name] [name] is a 53 y.o. male.   Patient is a 53 year old male who presents to the emergency department with a chief complaint of pain to the right shoulder following a syncopal event which occurred this morning with fall.  Patient is unsure if he struck his head during the fall but denies any active pain to his head.  He denies any pain to neck or back at this point.  He denies any pain to left upper extremity or bilateral lower extremities.  He denies any active numbness, paresthesias or weakness.  He does note that the pain is worse with any attempted movement at the right shoulder.  He denies any associated chest pain, shortness of breath, palpitations.  There is been no abdominal pain, nausea, vomiting, diarrhea.  He does note that he does have a history of syncopal events.   Fall  Loss of Consciousness      Prior to Admission medications   Medication Sig Start Date End Date Taking? Authorizing Provider  Accu-Chek FastClix Lancets MISC Check blood sugar 3 times daily 04/12/23   Shamleffer, Donell Cardinal, MD  acetaminophen  (TYLENOL ) 500 MG tablet Take 1,000 mg by mouth 2 (two) times daily as needed for moderate pain, fever or headache.    [provider]  albuterol  (VENTOLIN  HFA) 108 (90 Base) MCG/ACT inhaler Inhale 2 puffs into the lungs every 6 (six) hours as needed for wheezing or shortness of breath.    [provider]  ALPRAZolam  (XANAX ) 1 MG tablet Take 1 mg by mouth at bedtime as needed for anxiety or sleep. 03/11/23   [provider]  amLODipine  (NORVASC ) 5 MG tablet Take 5 mg by mouth every evening. 02/07/23   [provider]  ARIPiprazole  (ABILIFY ) 10 MG tablet Take 10 mg by mouth in the morning.    [provider]  aspirin  EC 81 MG tablet  Take 81 mg by mouth daily with breakfast. 02/14/21   [provider]  atorvastatin  (LIPITOR ) 80 MG tablet Take 1 tablet (80 mg total) by mouth at bedtime. 12/01/23   Debera Jayson MATSU, MD  Blood Glucose Monitoring Suppl (ACCU-CHEK GUIDE) w/Device KIT Use to check blood sugar 3 times daily 04/12/23   Shamleffer, Ibtehal Jaralla, MD  budesonide -formoterol  (SYMBICORT ) 80-4.5 MCG/ACT inhaler Take 2 puffs first thing in am and then another 2 puffs about 12 hours later. Patient taking differently: Inhale 2 puffs into the lungs 2 (two) times daily as needed (wheeze, shortness of breath). 06/11/22   Darlean Ozell NOVAK, MD  clopidogrel  (PLAVIX ) 75 MG tablet Take 1 tablet (75 mg total) by mouth daily. 06/20/23 06/19/24  Pearline Norman RAMAN, MD  Continuous Glucose Sensor (FREESTYLE LIBRE 2 PLUS SENSOR) MISC 1 Device by Does not apply route every 14 (fourteen) days. 07/12/23   Shamleffer, Ibtehal Jaralla, MD  Continuous Glucose Sensor (FREESTYLE LIBRE 2 SENSOR) MISC Change sensors every 14 days 04/11/23   Shamleffer, Ibtehal Jaralla, MD  Continuous Glucose Sensor (FREESTYLE LIBRE 3 PLUS SENSOR) MISC 1 Device by Other route every 14 (fourteen) days. Change sensor every 15 days. 04/11/23   Shamleffer, Ibtehal Jaralla, MD  doxycycline  (VIBRA -TABS) 100 MG tablet TAKE 1 TABLET BY MOUTH TWICE A DAY 09/06/23   Valdemar Rocky SAUNDERS, NP  ezetimibe  (ZETIA ) 10 MG tablet Take 1 tablet (  10 mg total) by mouth daily. 12/01/23 02/29/24  Debera Jayson MATSU, MD  famotidine  (PEPCID ) 40 MG tablet Take 1 tablet (40 mg total) by mouth 2 (two) times daily. 05/26/21   Comer Kirsch, PA-C  gabapentin  (NEURONTIN ) 300 MG capsule Take 1 capsule (300 mg total) by mouth every 8 (eight) hours as needed. 10/22/21   Cottie Donnice PARAS, MD  glucose blood (ACCU-CHEK GUIDE) test strip Check blood sugar 3 times daily 04/12/23   Shamleffer, Ibtehal Jaralla, MD  glucose blood (TRUE METRIX BLOOD GLUCOSE TEST) test strip Check blood sugar 3 times daily 04/11/23    Shamleffer, Ibtehal Jaralla, MD  HYDROcodone -acetaminophen  (NORCO/VICODIN) 5-325 MG tablet Take 1 tablet by mouth every 6 (six) hours as needed. 08/16/23   Harden Jerona GAILS, MD  insulin  aspart (NOVOLOG  FLEXPEN) 100 UNIT/ML FlexPen Start CF: NovoLog  (BG-130/45) TIDQAC  Max daily 30 units Patient taking differently: Inject 0-7 Units into the skin 3 (three) times daily before meals. Start CF: NovoLog  (BG-130/45) TIDQAC  Max daily 30 units 04/12/23   Shamleffer, Ibtehal Jaralla, MD  insulin  glargine (LANTUS  SOLOSTAR) 100 UNIT/ML Solostar Pen Inject 12 Units into the skin daily. 10/27/23   Shamleffer, Ibtehal Jaralla, MD  Insulin  Pen Needle 32G X 4 MM MISC 1 Device by Does not apply route in the morning, at noon, in the evening, and at bedtime. 04/11/23   Shamleffer, Ibtehal Jaralla, MD  linaclotide  (LINZESS ) 145 MCG CAPS capsule Take 1 capsule (145 mcg total) by mouth daily before breakfast. 06/21/22 06/22/26  Cirigliano, Vito V, DO  midodrine  (PROAMATINE ) 10 MG tablet Take 1 tablet (10 mg total) by mouth 3 (three) times daily. 08/04/22   Vivienne Lonni Ingle, NP  mupirocin  ointment (BACTROBAN ) 2 % Apply topically 2 (two) times daily. 05/11/23   [provider]  nitroGLYCERIN  (NITRODUR - DOSED IN MG/24 HR) 0.2 mg/hr patch Place 1 patch (0.2 mg total) onto the skin daily. 08/24/23   Valdemar Rocky SAUNDERS, NP  nitroGLYCERIN  (NITROSTAT ) 0.4 MG SL tablet Place 1 tablet (0.4 mg total) under the tongue every 5 (five) minutes as needed for chest pain. 10/05/17   Comer Kirsch, PA-C  polyethylene glycol (MIRALAX  / GLYCOLAX ) 17 g packet Take 17 g by mouth daily. 11/11/20   Haze Lonni PARAS, MD  prochlorperazine  (COMPAZINE ) 10 MG tablet Take 1 tablet (10 mg total) by mouth every 6 (six) hours as needed for nausea or vomiting. 07/07/22   Raford Lenis, MD  promethazine  (PHENERGAN ) 25 MG tablet Take 25 mg by mouth daily as needed for nausea or vomiting.    [provider]  sertraline  (ZOLOFT ) 100 MG tablet  Take 2 tablets (200 mg total) by mouth at bedtime. 08/30/22   Christobal Guadalajara, MD  traZODone (DESYREL) 50 MG tablet Take 50 mg by mouth at bedtime. 10/03/20   [provider]    Allergies: Prilosec otc [omeprazole magnesium ], Bee venom, Morphine , and Nexium [esomeprazole]    Review of Systems  Cardiovascular:  Positive for syncope.  Musculoskeletal:        Right shoulder pain  All other systems reviewed and are negative.   Updated Vital Signs BP (!) 144/94   Pulse 94   Temp 97.9 F (36.6 C) (Oral)   Ht 5' 8 (1.727 m)   Wt 62.1 kg   SpO2 98%   BMI 20.83 kg/m   Physical Exam Vitals and nursing note reviewed.  Constitutional:      General: He is not in acute distress.    Appearance: Normal appearance.  He is not ill-appearing.  HENT:     Head: Normocephalic and atraumatic.     Nose: Nose normal.     Mouth/Throat:     Mouth: Mucous membranes are moist.  Eyes:     Extraocular Movements: Extraocular movements intact.     Conjunctiva/sclera: Conjunctivae normal.     Pupils: Pupils are equal, round, and reactive to light.  Cardiovascular:     Rate and Rhythm: Normal rate and regular rhythm.     Pulses: Normal pulses.     Heart sounds: Normal heart sounds. No murmur heard.    No gallop.  Pulmonary:     Effort: Pulmonary effort is normal. No respiratory distress.     Breath sounds: Normal breath sounds. No stridor. No wheezing, rhonchi or rales.  Abdominal:     General: Abdomen is flat. Bowel sounds are normal. There is no distension.     Palpations: Abdomen is soft.     Tenderness: There is no abdominal tenderness. There is no guarding.  Musculoskeletal:        General: Normal range of motion.     Cervical back: Normal range of motion and neck supple. No rigidity or tenderness.     Comments: Tender to palpation noted over right shoulder with overlying edema, tender to palpation noted over right elbow and wrist, nontender palpation of the left upper extremity diffusely,  radial pulse 2+ distally, radial, ulnar, median, axillary nerve function intact distally, nontender palpation over bilateral lower extremities, pelvis stable to AP and lateral compression, peripheral pulses in lower extremities intact, sensation intact throughout, full range of motion noted to lower extremities, limited active and passive range of motion of right shoulder secondary to pain and swelling, nontender palpation over thoracic or lumbar spine, no step-off or deformity  Skin:    General: Skin is warm and dry.     Findings: No bruising or rash.  Neurological:     General: No focal deficit present.     Mental Status: He is alert and oriented to person, place, and time. Mental status is at baseline.     Cranial Nerves: No cranial nerve deficit.     Sensory: No sensory deficit.     Motor: No weakness.     Coordination: Coordination normal.     Gait: Gait normal.  Psychiatric:        Mood and Affect: Mood normal.        Behavior: Behavior normal.        Thought Content: Thought content normal.        Judgment: Judgment normal.     (all labs ordered are listed, but only abnormal results are displayed) Labs Reviewed  COMPREHENSIVE METABOLIC PANEL WITH GFR  CBC  URINALYSIS, ROUTINE W REFLEX MICROSCOPIC  CBG MONITORING, ED  TROPONIN I (HIGH SENSITIVITY)    EKG: None  Radiology: No results found.   Procedures   Medications Ordered in the ED  HYDROmorphone  (DILAUDID ) injection 1 mg (has no administration in time range)  ondansetron  (ZOFRAN ) injection 4 mg (has no administration in time range)  sodium chloride  0.9 % bolus 500 mL (has no administration in time range)                                    Medical Decision Making Amount and/or Complexity of Data Reviewed Labs: ordered. Radiology: ordered.  Risk Prescription drug management. Decision regarding hospitalization.   This  patient presents to the ED for concern of syncope, right shoulder pain, this involves  an extensive number of treatment options, and is a complaint that carries with it a high risk of complications and morbidity.  The differential diagnosis includes long bone or joint fracture, intracranial hemorrhage, CVA, TIA, orthostatic hypotension, renal failure, electrolyte derangement, sepsis, pneumonia   Co morbidities that complicate the patient evaluation  CAD, peripheral arterial disease, CKD   Additional history obtained:  Additional history obtained from medical records External records from outside source obtained and reviewed including records   Lab Tests:  I Ordered, and personally interpreted labs.  The pertinent results include: Leukocytosis, anemia, elevated creatinine but at baseline, normal liver function, normal electrolytes, negative serial troponins, unremarkable urinalysis   Imaging Studies ordered:  I ordered imaging studies including CT scan head, cervical spine, x-ray of right wrist, elbow, shoulder I independently visualized and interpreted imaging which showed no acute intracranial process, no cervical spine fracture, no acute osseous injury of the right wrist or elbow, right proximal humerus fracture I agree with the radiologist interpretation   Cardiac Monitoring: / EKG:  The patient was maintained on a cardiac monitor.  I personally viewed and interpreted the cardiac monitored which showed an underlying rhythm of: Normal sinus rhythm, no ST/T wave changes, no ischemic changes, no STEMI   Consultations Obtained:  I requested consultation with the hospitalist,  and discussed lab and imaging findings as well as pertinent plan - they recommend: Admission   Problem List / ED Course / Critical interventions / Medication management  Patient is doing well at this time and does remain stable.  Discussed with patient we will plan for admission to the hospital service given his unexplained syncope.  He was noted to be orthostatic on initial presentation and  has been given IV fluids but does continue to have some lightheadedness with standing.  Creatinine is at baseline.  He does have a chronic leukocytosis but it is slightly worsened from previous.  Anemia is at baseline.  No obvious infectious source at this point.  CT scan of head and cervical spine was unremarkable.  He does have a right-sided humeral fracture and he was placed in a sling for this.  He was neurovascularly intact distally from the injury.  He had no other secondary sites of injury or pain on exam.  He was nontender to palpation over thoracic or lumbar spine.  There was no tenderness over chest wall and abdomen.  He had no preceding chest pain, shortness of breath, palpitations.  Did discuss patient case with Dr. Willette with the hospitalist service who has excepted for admission. I ordered medication including Dilaudid , Zofran , IV fluids for syncope, right humeral fracture Reevaluation of the patient after these medicines showed that the patient improved I have reviewed the patients home medicines and have made adjustments as needed   Social Determinants of Health:  None   Test / Admission - Considered:  Admission     Final diagnoses:  None    ED Discharge Orders     None          [name] Lonni BIRCH, PA-C 02/04/24 1414    Suzette Pac, MD 02/04/24 1648

## 2024-02-04 NOTE — Assessment & Plan Note (Signed)
-   On right shoulder x-ray, minimal impacted right humeral neck fracture -Patient has been evaluated in ED, sling has been placed -Continue to monitor closely -Follows in outpatient with PCP and Ortho as needed -As needed analgesics -PT OT

## 2024-02-04 NOTE — Assessment & Plan Note (Signed)
 Continue Neurontin

## 2024-02-04 NOTE — Assessment & Plan Note (Signed)
-   Continuing aspirin , Plavix , statins

## 2024-02-04 NOTE — Assessment & Plan Note (Signed)
-   History significant for hypertension, also hypotension - Checking orthostatic BPs, -Med list indicates midodrine  -pending final verification -Checking blood pressure closely including orthostatic BPs

## 2024-02-04 NOTE — Plan of Care (Signed)
  Problem: Clinical Measurements: Goal: Ability to maintain clinical measurements within normal limits will improve Outcome: Progressing Goal: Cardiovascular complication will be avoided Outcome: Progressing   Problem: Activity: Goal: Risk for activity intolerance will decrease Outcome: Progressing   Problem: Nutrition: Goal: Adequate nutrition will be maintained Outcome: Progressing   Problem: Coping: Goal: Level of anxiety will decrease Outcome: Progressing   Problem: Elimination: Goal: Will not experience complications related to urinary retention Outcome: Progressing   Problem: Pain Managment: Goal: General experience of comfort will improve and/or be controlled Outcome: Progressing

## 2024-02-04 NOTE — H&P (Addendum)
 History and Physical   Patient: [name] [name]                            PCP: [name], PA-C                    DOB: 1971-01-16            DOA: 02/04/2024 FMW:[id number]             DOS: 02/04/2024, 2:18 PM  Long, Scott, PA-C  Patient coming from:   HOME  I have personally reviewed patient's medical records, in electronic medical records, including:  [facility], and care everywhere.    Chief Complaint:   Chief Complaint  Patient presents with   Fall   Loss of Consciousness    History of present illness:    [name] [name] is a 53 year old male with extensive history of DM2, HTN, HLD, depression, anxiety, GERD, CAD, chronic bronchitis, COPD diabetic neuropathy, PVD,s/p amputation of fourth, fifth toe... Presented to the ED with chief complaint of right shoulder pain after having a syncopal episode.  Patient reports earlier this morning got up to go to the bathroom had a spontaneous passing out spell. Struck his head during the fall, but denies any neck or back pain.  Reports he might been out for maybe about a minute.  Checked his sugar was 238  Denies any recent illnesses, fever or chills, denies of having any asymmetric weakness.  Only on the right shoulder due to pain.  Denies any chest pain or shortness of breath or palpitation. Has no recollection of the event, states he did not notice loss of bowel or bladder function.   ED evaluation: Blood pressure (!) 143/85, pulse 85, temperature 97.9 F (36.6 C), RR 16, SpO2 99%.   Labs; WBC 19.7, hemoglobin 12.4, BUN 32, creatinine 2.00, troponin 18, 17, glucose 274 UA hazy, small hemoglobin-nitrites or ketones no bacteria, WBC 0-5 Imaging: CT head/cervical spine; no traumatic fractures, no abnormalities-chronic small vessel disease Right shoulder x-ray: Minimally impacted fracture of the right humeral surgical neck.      Patient Denies having: Fever, Chills, Cough, SOB, Chest Pain, Abd pain, N/V/D, headache, dizziness,  lightheadedness,  Dysuria,  rash, open wounds     Review of Systems: As per HPI, otherwise 10 point review of systems were negative.   ----------------------------------------------------------------------------------------------------------------------  Allergies  Allergen Reactions   Bee Venom Swelling   Morphine  Itching and Nausea And Vomiting    IV site   Nexium [Esomeprazole] Swelling    Face swells, no breathing impairment   Prilosec Otc [Omeprazole Magnesium ] Swelling    Face swells, no breathing impairment    Home MEDs:  Prior to Admission medications   Medication Sig Start Date End Date Taking? Authorizing Provider  Accu-Chek FastClix Lancets MISC Check blood sugar 3 times daily 04/12/23   Shamleffer, Donell Cardinal, MD  acetaminophen  (TYLENOL ) 500 MG tablet Take 1,000 mg by mouth 2 (two) times daily as needed for moderate pain, fever or headache.    [provider]  albuterol  (VENTOLIN  HFA) 108 (90 Base) MCG/ACT inhaler Inhale 2 puffs into the lungs every 6 (six) hours as needed for wheezing or shortness of breath.    [provider]  ALPRAZolam  (XANAX ) 1 MG tablet Take 1 mg by mouth at bedtime as needed for anxiety or sleep. 03/11/23   [provider]  ARIPiprazole  (ABILIFY ) 10 MG tablet Take 10  mg by mouth in the morning.    [provider]  aspirin  EC 81 MG tablet Take 81 mg by mouth daily with breakfast. 02/14/21   [provider]  atorvastatin  (LIPITOR ) 80 MG tablet Take 1 tablet (80 mg total) by mouth at bedtime. 12/01/23   Debera Jayson MATSU, MD  Blood Glucose Monitoring Suppl (ACCU-CHEK GUIDE) w/Device KIT Use to check blood sugar 3 times daily 04/12/23   Shamleffer, Ibtehal Jaralla, MD  budesonide -formoterol  (SYMBICORT ) 80-4.5 MCG/ACT inhaler Take 2 puffs first thing in am and then another 2 puffs about 12 hours later. Patient taking differently: Inhale 2 puffs into the lungs 2 (two) times daily as needed (wheeze, shortness  of breath). 06/11/22   Darlean Ozell NOVAK, MD  clopidogrel  (PLAVIX ) 75 MG tablet Take 1 tablet (75 mg total) by mouth daily. 06/20/23 06/19/24  Pearline Norman RAMAN, MD  Continuous Glucose Sensor (FREESTYLE LIBRE 2 PLUS SENSOR) MISC 1 Device by Does not apply route every 14 (fourteen) days. 07/12/23   Shamleffer, Ibtehal Jaralla, MD  Continuous Glucose Sensor (FREESTYLE LIBRE 2 SENSOR) MISC Change sensors every 14 days 04/11/23   Shamleffer, Ibtehal Jaralla, MD  Continuous Glucose Sensor (FREESTYLE LIBRE 3 PLUS SENSOR) MISC 1 Device by Other route every 14 (fourteen) days. Change sensor every 15 days. 04/11/23   Shamleffer, Ibtehal Jaralla, MD  doxycycline  (VIBRA -TABS) 100 MG tablet TAKE 1 TABLET BY MOUTH TWICE A DAY 09/06/23   Valdemar Rocky SAUNDERS, NP  ezetimibe  (ZETIA ) 10 MG tablet Take 1 tablet (10 mg total) by mouth daily. 12/01/23 02/29/24  Debera Jayson MATSU, MD  famotidine  (PEPCID ) 40 MG tablet Take 1 tablet (40 mg total) by mouth 2 (two) times daily. 05/26/21   Comer Kirsch, PA-C  gabapentin  (NEURONTIN ) 300 MG capsule Take 1 capsule (300 mg total) by mouth every 8 (eight) hours as needed. 10/22/21   Cottie Donnice PARAS, MD  glucose blood (ACCU-CHEK GUIDE) test strip Check blood sugar 3 times daily 04/12/23   Shamleffer, Ibtehal Jaralla, MD  glucose blood (TRUE METRIX BLOOD GLUCOSE TEST) test strip Check blood sugar 3 times daily 04/11/23   Shamleffer, Ibtehal Jaralla, MD  HYDROcodone -acetaminophen  (NORCO/VICODIN) 5-325 MG tablet Take 1 tablet by mouth every 6 (six) hours as needed. 08/16/23   Harden Jerona GAILS, MD  insulin  aspart (NOVOLOG  FLEXPEN) 100 UNIT/ML FlexPen Start CF: NovoLog  (BG-130/45) TIDQAC  Max daily 30 units Patient taking differently: Inject 0-7 Units into the skin 3 (three) times daily before meals. Start CF: NovoLog  (BG-130/45) TIDQAC  Max daily 30 units 04/12/23   Shamleffer, Ibtehal Jaralla, MD  insulin  glargine (LANTUS  SOLOSTAR) 100 UNIT/ML Solostar Pen Inject 12 Units into the skin daily. 10/27/23    Shamleffer, Ibtehal Jaralla, MD  Insulin  Pen Needle 32G X 4 MM MISC 1 Device by Does not apply route in the morning, at noon, in the evening, and at bedtime. 04/11/23   Shamleffer, Ibtehal Jaralla, MD  linaclotide  (LINZESS ) 145 MCG CAPS capsule Take 1 capsule (145 mcg total) by mouth daily before breakfast. 06/21/22 06/22/26  Cirigliano, Vito V, DO  midodrine  (PROAMATINE ) 10 MG tablet Take 1 tablet (10 mg total) by mouth 3 (three) times daily. 08/04/22   Vivienne Lonni Ingle, NP  mupirocin  ointment (BACTROBAN ) 2 % Apply topically 2 (two) times daily. 05/11/23   [provider]  nitroGLYCERIN  (NITRODUR - DOSED IN MG/24 HR) 0.2 mg/hr patch Place 1 patch (0.2 mg total) onto the skin daily. 08/24/23   Zamora, Erin R, NP  nitroGLYCERIN  (NITROSTAT ) 0.4 MG  SL tablet Place 1 tablet (0.4 mg total) under the tongue every 5 (five) minutes as needed for chest pain. 10/05/17   Comer Kirsch, PA-C  polyethylene glycol (MIRALAX  / GLYCOLAX ) 17 g packet Take 17 g by mouth daily. 11/11/20   Haze Lonni PARAS, MD  sertraline  (ZOLOFT ) 100 MG tablet Take 2 tablets (200 mg total) by mouth at bedtime. 08/30/22   Christobal Guadalajara, MD    PRN MEDs: acetaminophen  **OR** acetaminophen , bisacodyl , hydrALAZINE , HYDROmorphone  (DILAUDID ) injection, ipratropium, levalbuterol , ondansetron  **OR** ondansetron  (ZOFRAN ) IV, oxyCODONE , senna-docusate, sodium phosphate , zolpidem   Past Medical History:  Diagnosis Date   Anxiety    Arthritis    Asthma    CAD (coronary artery disease)    a. 03/2015 Cath: LM nl, LAD mild diff dzs throughout w/ 50p/d, LCX diff dzs, 13m, RCA diff dzs, 30m-->Med rx; b. 12/2020 MV: EF 57%, small/mild apical inferior defect w/ partial rev/mild ischemia.   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, bilateral    Sleep apnea 2016   does not use CPAP   Type 2 diabetes mellitus (HCC)     Past Surgical History:  Procedure Laterality Date   ABDOMINAL AORTOGRAM W/LOWER EXTREMITY N/A 06/20/2023   Procedure: ABDOMINAL AORTOGRAM W/LOWER EXTREMITY;  Surgeon: Pearline Norman RAMAN, MD;  Location: [facility];  Service: Cardiovascular;  Laterality: N/A;   AMPUTATION Left 06/24/2023   Procedure: LEFT 5TH TOE AMPUTATION;  Surgeon: Harden Jerona GAILS, MD;  Location: [facility] OR;  Service: Orthopedics;  Laterality: Left;   AMPUTATION Left 07/20/2023   Procedure: LEFT FOOT 4TH AND 5TH RAY AMPUTATION;  Surgeon: Harden Jerona GAILS, MD;  Location: [facility] OR;  Service: Orthopedics;  Laterality: Left;   ANKLE SURGERY Right 1982   had extra bones in there; took them out   APPENDECTOMY  1975   BIOPSY  12/26/2018   Procedure: BIOPSY;  Surgeon: Golda Claudis PENNER, MD;  Location: [facility];  Service: Endoscopy;;  duodenal biopsies   BIOPSY  03/23/2019   Procedure: BIOPSY;  Surgeon: Golda Claudis PENNER, MD;  Location: [facility];  Service: Endoscopy;;  esophagus   CARDIAC CATHETERIZATION N/A 03/28/2015   Procedure: Left Heart Cath and Coronary Angiography;  Surgeon: Gordy Bergamo, MD;  Location[facility] INVASIVE CV LAB;  Service: Cardiovascular;  Laterality: N/A;   CARDIAC CATHETERIZATION N/A 03/28/2015   Procedure: Intravascular Pressure Wire/FFR Study;  Surgeon: Gordy Bergamo, MD;  Location: [facility] INVASIVE CV LAB;  Service: Cardiovascular;  Laterality: N/A;   CARPAL TUNNEL RELEASE Left ~ 2008   COLONOSCOPY WITH ESOPHAGOGASTRODUODENOSCOPY (EGD)     COLONOSCOPY WITH PROPOFOL  N/A 12/24/2019   Procedure: COLONOSCOPY WITH PROPOFOL ;  Surgeon: Golda Claudis PENNER, MD;  Location: [facility];  Service: Endoscopy;  Laterality: N/A;  955   ELBOW FRACTURE SURGERY Left ~ 2008   ESOPHAGEAL MANOMETRY N/A  09/16/2021   Procedure: ESOPHAGEAL MANOMETRY (EM);  Surgeon: San Sandor GAILS, DO;  Location: [facility]OSCOPY;  Service: Endoscopy;  Laterality: N/A;   ESOPHAGOGASTRODUODENOSCOPY N/A 12/26/2018   Procedure: ESOPHAGOGASTRODUODENOSCOPY (EGD);  Surgeon: Golda Claudis PENNER, MD;  Location: [facility];  Service: Endoscopy;  Laterality: N/A;   ESOPHAGOGASTRODUODENOSCOPY (EGD) WITH PROPOFOL  N/A 03/23/2019   Procedure: ESOPHAGOGASTRODUODENOSCOPY (EGD) WITH PROPOFOL ;  Surgeon: Golda Claudis PENNER, MD;  Location: [facility];  Service: Endoscopy;  Laterality: N/A;  7:30   FRACTURE SURGERY     ankle and elbow   PERIPHERAL VASCULAR BALLOON ANGIOPLASTY  06/20/2023   Procedure: PERIPHERAL VASCULAR BALLOON ANGIOPLASTY;  Surgeon: Pearline Norman RAMAN, MD;  Location: [facility];  Service: Cardiovascular;;   PH IMPEDANCE STUDY N/A 09/16/2021   Procedure: PH IMPEDANCE STUDY;  Surgeon: San Sandor GAILS, DO;  Location: [facility];  Service: Endoscopy;  Laterality: N/A;   PILONIDAL CYST EXCISION N/A 03/24/2017   Procedure: EXCISION CHRONIC  PILONIDAL ABSCESS;  Surgeon: Vernetta Berg, MD;  Location: [facility] ORS;  Service: General;  Laterality: N/A;   SCROTAL EXPLORATION N/A 11/13/2020   Procedure: EXCISION OF SEBACEOUS CYSTS, SCROTUM;  Surgeon: Sherrilee Belvie CROME, MD;  Location: [facility] ORS;  Service: Urology;  Laterality: N/A;   TENDON REPAIR Left ~ 2004   main tendon in my ankle     reports that he has been smoking cigarettes. He has a 34 pack-year smoking history. He has never been exposed to tobacco smoke. He has never used smokeless tobacco. He reports that he does not drink alcohol and does not use drugs.   Family History  Problem Relation Age of Onset   Diabetes Mother    Hypertension Mother    Cancer Mother    Throat cancer Mother    Diabetes Father    Hypertension Father    Hyperlipidemia Father    Congestive Heart Failure Sister    Colon cancer Neg Hx    Pancreatic cancer Neg Hx    Liver disease  Neg Hx     Physical Exam:   Vitals:   02/04/24 1141 02/04/24 1200 02/04/24 1230 02/04/24 1330  BP:  (!) 175/97 (!) 156/98 (!) 143/85  Pulse:  84 88 85  Resp:  13 18 16   Temp:    98 F (36.7 C)  TempSrc:    Oral  SpO2: 98% 99% 99% 99%  Weight:      Height:       Constitutional: NAD, calm, comfortable Eyes: PERRL, lids and conjunctivae normal ENMT: Mucous membranes are moist. Posterior pharynx clear of any exudate or lesions.Normal dentition.  Neck: normal, supple, no masses, no thyromegaly Respiratory: clear to auscultation bilaterally, no wheezing, no crackles. Normal respiratory effort. No accessory muscle use.  Cardiovascular: Regular rate and rhythm, no murmurs / rubs / gallops. No extremity edema. 2+ pedal pulses. No carotid bruits.  Abdomen: no tenderness, no masses palpated. No hepatosplenomegaly. Bowel sounds positive.  Musculoskeletal: Left foot ambulate toes Right upper extremity limited range of motion due to pain no clubbing / cyanosis. No joint deformity upper and lower extremities. Good ROM, no contractures. Normal muscle tone.  Neurologic: CN II-XII grossly intact. Sensation intact, DTR normal. Strength 5/5 in all 4.  Psychiatric: Normal judgment and insight. Alert and oriented x 3. Normal mood.  Skin: no rashes, lesions, ulcers. No induration No visible open wounds, erythema or edema    Labs on admission:    I have personally reviewed following labs and imaging studies  CBC: Recent Labs  Lab 02/04/24 1008  WBC 19.7*  HGB 12.4*  HCT 37.0*  MCV 93.7  PLT 232   Basic Metabolic Panel: Recent Labs  Lab 02/04/24 1008  NA 135  K 4.7  CL  99  CO2 23  GLUCOSE 274*  BUN 32*  CREATININE 2.00*  CALCIUM  8.4*   GFR: Estimated Creatinine Clearance: 37.1 mL/min (A) (by C-G formula based on SCr of 2 mg/dL (H)). Liver Function Tests: Recent Labs  Lab 02/04/24 1008  AST 15  ALT 14  ALKPHOS 97  BILITOT 1.5*  PROT 6.2*  ALBUMIN  3.0*    Urine  analysis:    Component Value Date/Time   COLORURINE YELLOW 02/04/2024 0922   APPEARANCEUR HAZY (A) 02/04/2024 0922   APPEARANCEUR Clear 01/23/2021 0904   LABSPEC 1.016 02/04/2024 0922   PHURINE 5.0 02/04/2024 0922   GLUCOSEU 150 (A) 02/04/2024 0922   HGBUR SMALL (A) 02/04/2024 0922   BILIRUBINUR NEGATIVE 02/04/2024 0922   BILIRUBINUR negative 02/25/2021 1121   BILIRUBINUR Negative 01/23/2021 0904   KETONESUR NEGATIVE 02/04/2024 0922   PROTEINUR >=300 (A) 02/04/2024 0922   UROBILINOGEN 0.2 02/25/2021 1121   NITRITE NEGATIVE 02/04/2024 0922   LEUKOCYTESUR NEGATIVE 02/04/2024 0922    Last A1C:  Lab Results  Component Value Date   HGBA1C 7.3 (A) 10/27/2023     Radiologic Exams on Admission:   CT Head Wo Contrast Result Date: 02/04/2024 CLINICAL DATA:  Syncope/presyncope, cerebrovascular cause suspected; Ataxia, cervical trauma. Fall. Right shoulder pain. EXAM: CT HEAD WITHOUT CONTRAST CT CERVICAL SPINE WITHOUT CONTRAST TECHNIQUE: Multidetector CT imaging of the head and cervical spine was performed following the standard protocol without intravenous contrast. Multiplanar CT image reconstructions of the cervical spine were also generated. RADIATION DOSE REDUCTION: This exam was performed according to the departmental dose-optimization program which includes automated exposure control, adjustment of the mA and/or kV according to patient size and/or use of iterative reconstruction technique. COMPARISON:  Head CT 02/08/2023 and MRI 09/06/2023 FINDINGS: CT HEAD FINDINGS Brain: There is no evidence of an acute infarct, intracranial hemorrhage, mass, midline shift, or extra-axial fluid collection. There is mild cerebral atrophy. The ventricles are normal in size. Cerebral white matter hypodensities are nonspecific but compatible with mild chronic small vessel ischemic disease. Vascular: Calcified atherosclerosis at the skull base. No hyperdense vessel. Skull: No fracture or suspicious lesion.  Sinuses/Orbits: Visualized paranasal sinuses and mastoid air cells are clear. Unremarkable orbits. Other: None. CT CERVICAL SPINE FINDINGS Alignment: Reversal of the normal cervical lordosis. No significant listhesis. Skull base and vertebrae: No acute fracture or suspicious lesion. Soft tissues and spinal canal: No prevertebral fluid or swelling. No visible canal hematoma. Disc levels: Focally advanced disc degeneration at C5-6 and C6-7 with severe disc space narrowing, degenerative endplate sclerosis, and spurring. Mild spinal stenosis at both levels. Asymmetrically severe left neural foraminal stenosis at C6-7. Upper chest: Clear lung apices. Other: Mild atherosclerotic calcification at the right greater than left carotid bifurcations. IMPRESSION: 1. No evidence of acute intracranial abnormality or cervical spine fracture. 2. Mild chronic small vessel ischemic disease. Electronically Signed   By: Dasie Hamburg M.D.   On: 02/04/2024 11:07   CT Cervical Spine Wo Contrast Result Date: 02/04/2024 CLINICAL DATA:  Syncope/presyncope, cerebrovascular cause suspected; Ataxia, cervical trauma. Fall. Right shoulder pain. EXAM: CT HEAD WITHOUT CONTRAST CT CERVICAL SPINE WITHOUT CONTRAST TECHNIQUE: Multidetector CT imaging of the head and cervical spine was performed following the standard protocol without intravenous contrast. Multiplanar CT image reconstructions of the cervical spine were also generated. RADIATION DOSE REDUCTION: This exam was performed according to the departmental dose-optimization program which includes automated exposure control, adjustment of the mA and/or kV according to patient size and/or use of iterative reconstruction technique. COMPARISON:  Head CT 02/08/2023 and MRI 09/06/2023 FINDINGS: CT HEAD FINDINGS Brain: There is no evidence of an acute infarct, intracranial hemorrhage, mass, midline shift, or extra-axial fluid collection. There is mild cerebral atrophy. The ventricles are normal in  size. Cerebral white matter hypodensities are nonspecific but compatible with mild chronic small vessel ischemic disease. Vascular: Calcified atherosclerosis at the skull base. No hyperdense vessel. Skull: No fracture or suspicious lesion. Sinuses/Orbits: Visualized paranasal sinuses and mastoid air cells are clear. Unremarkable orbits. Other: None. CT CERVICAL SPINE FINDINGS Alignment: Reversal of the normal cervical lordosis. No significant listhesis. Skull base and vertebrae: No acute fracture or suspicious lesion. Soft tissues and spinal canal: No prevertebral fluid or swelling. No visible canal hematoma. Disc levels: Focally advanced disc degeneration at C5-6 and C6-7 with severe disc space narrowing, degenerative endplate sclerosis, and spurring. Mild spinal stenosis at both levels. Asymmetrically severe left neural foraminal stenosis at C6-7. Upper chest: Clear lung apices. Other: Mild atherosclerotic calcification at the right greater than left carotid bifurcations. IMPRESSION: 1. No evidence of acute intracranial abnormality or cervical spine fracture. 2. Mild chronic small vessel ischemic disease. Electronically Signed   By: Dasie Hamburg M.D.   On: 02/04/2024 11:07   DG Wrist Complete Right Result Date: 02/04/2024 CLINICAL DATA:  Right wrist pain after fall. EXAM: RIGHT WRIST - COMPLETE 3+ VIEW COMPARISON:  None Available. FINDINGS: There is no evidence of fracture or dislocation. There is no evidence of arthropathy or other focal bone abnormality. Soft tissues are unremarkable. IMPRESSION: Negative. Electronically Signed   By: Lynwood Landy Raddle M.D.   On: 02/04/2024 10:20   DG Elbow Complete Right Result Date: 02/04/2024 CLINICAL DATA:  Right elbow pain after fall. EXAM: RIGHT ELBOW - COMPLETE 3+ VIEW COMPARISON:  None Available. FINDINGS: There is no evidence of fracture, dislocation, or joint effusion. There is no evidence of arthropathy or other focal bone abnormality. Soft tissues are unremarkable.  IMPRESSION: Negative. Electronically Signed   By: Lynwood Landy Raddle M.D.   On: 02/04/2024 10:17   DG Shoulder Right Result Date: 02/04/2024 CLINICAL DATA:  Status post fall with right shoulder pain EXAM: RIGHT SHOULDER - 3 VIEW COMPARISON:  None Available. FINDINGS: Minimally impacted fracture of the right humeral surgical neck. Small displaced fracture fragment projects along the medial aspect of the neck. No acute dislocation of the glenohumeral joint. Diffuse soft tissue swelling surrounding the right shoulder. IMPRESSION: Minimally impacted fracture of the right humeral surgical neck. Electronically Signed   By: Limin  Xu M.D.   On: 02/04/2024 10:16    EKG:   Independently reviewed.  Orders placed or performed during the hospital encounter of 02/04/24   EKG 12-Lead   EKG 12-Lead   ED EKG   ED EKG   EKG 12-Lead   ---------------------------------------------------------------------------------------------------------------------------------------    Assessment / Plan:   Principal Problem:   Syncope and collapse Active Problems:   COPD GOLD 1/ AB   Humeral surgical neck fracture   Leukocytosis   Gastroesophageal reflux disease   Renal insufficiency   Diabetes mellitus (HCC)   Diabetic polyneuropathy (HCC)   Right bundle branch block   Anxiety disorder   Benign hypertension   Mixed hyperlipidemia   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)   PVD (peripheral vascular disease) (HCC)   Assessment and Plan: * Syncope and collapse - Will continue to monitor on a monitored bed, - Ruling out dysrhythmias -Obtaining 2D echocardiogram -EKGs as needed -Following labs -Elevated creatinine unable  to obtain CTA, will get a VQ scan (As rate of syncope is high with acute PE) - Obtain D-dimer -Orthostatic BP checks -Neurochecks  Humeral surgical neck fracture - On right shoulder x-ray, minimal impacted right humeral neck fracture -Patient has  been evaluated in ED, sling has been placed -Continue to monitor closely -Follows in outpatient with PCP and Ortho as needed -As needed analgesics -PT OT  COPD GOLD 1/ AB - Not O2 dependent no signs of exacerbation continue as needed DuoNeb bronchodilators, home inhalers -    Diabetes mellitus (HCC) - Reviewing home regimen, continue long-acting insulin  - Checking CBG q. ACHS, SSI coverage     Renal insufficiency - Elevated BUN/creatinine-close to baseline (1.6-2.0) -Ruling out CKD possibly stage II and a Lab Results  Component Value Date   CREATININE 2.00 (H) 02/04/2024   CREATININE 2.14 (H) 07/20/2023   CREATININE 1.60 (H) 06/20/2023     Gastroesophageal reflux disease - Continue PPI  Leukocytosis - Afebrile, normotensive,  - Possible reactive -Ruling out infection, obtaining lactic acid, procalcitonin level - No source of infection-UA negative imaging within normal limits -Withholding antibiotics at this point  Right bundle branch block - Monitoring closely-on monitored bed -EKG as needed  Diabetic polyneuropathy (HCC) Continue Neurontin   PVD (peripheral vascular disease) (HCC) - With history of toe amputations -Continuing aspirin , Plavix , statins, optimizing glycemic control  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) - Continuing aspirin , Plavix , statins  Mixed hyperlipidemia - Continue statins  Benign hypertension - History significant for hypertension, also hypotension - Checking orthostatic BPs, -Med list indicates midodrine  -pending final verification -Checking blood pressure closely including orthostatic BPs  Anxiety disorder - Minimizing use of benzodiazepine home medication indicative of as needed Xanax      Consults called:  None -------------------------------------------------------------------------------------------------------------------------------------------- DVT prophylaxis:  heparin   injection 5,000 Units Start: 02/04/24 2200 SCDs Start: 02/04/24 1345   Code Status:   Code Status: Full Code   Admission status: Patient will be admitted as Inpatient, with a greater than 2 midnight length of stay. Level of care: Telemetry   Family Communication:  none at bedside  (The above findings and plan of care has been discussed with patient in detail, the patient expressed understanding and agreement of above plan)  --------------------------------------------------------------------------------------------------------------------------------------------------  Disposition Plan:  Anticipated 1-2 days Status is: Inpatient Remains inpatient appropriate because: Needing evaluation for recurrent syncope, humerus fracture, ----------------------------------------------------------------------------------------------------------------------------------------------------  Time spent:  13  Min.  Was spent seeing and evaluating the patient, reviewing all medical records, drawn plan of care.  SIGNED: Adriana DELENA Grams, MD, FHM. FAAFP. [place] [facility], Pager  (Please use [email or web address] to page/ or secure chat through epic) If 7PM-7AM, please contact night-coverage [email or web address],  02/04/2024, 2:18 PM

## 2024-02-04 NOTE — Assessment & Plan Note (Signed)
-   Minimizing use of benzodiazepine home medication indicative of as needed Xanax

## 2024-02-04 NOTE — Assessment & Plan Note (Signed)
 Continue PPI.

## 2024-02-04 NOTE — Assessment & Plan Note (Addendum)
-   Monitoring closely-on monitored bed -EKG as needed

## 2024-02-04 NOTE — Assessment & Plan Note (Signed)
-   Reviewing home regimen, continue long-acting insulin  - Checking CBG q. ACHS, SSI coverage

## 2024-02-04 NOTE — Assessment & Plan Note (Signed)
-   Will continue to monitor on a monitored bed, - Ruling out dysrhythmias -Obtaining 2D echocardiogram -EKGs as needed -Following labs -Elevated creatinine unable to obtain CTA, will get a VQ scan (As rate of syncope is high with acute PE) - Obtain D-dimer -Orthostatic BP checks -Neurochecks

## 2024-02-04 NOTE — ED Notes (Signed)
 Orthostatic VS: Laying: BP 157/96 MAP 113 HR 88 O2 99% RA   Sitting: BP 158/94 MAP 113 HR 90 O2 99% RA    Standing: BP 138/78 MAP 89 HR 87 O2 96% RA

## 2024-02-04 NOTE — Plan of Care (Signed)
  Problem: Education: Goal: Knowledge of General Education information will improve Description: Including pain rating scale, medication(s)/side effects and non-pharmacologic comfort measures Outcome: Progressing   Problem: Clinical Measurements: Goal: Diagnostic test results will improve Outcome: Progressing   Problem: Coping: Goal: Level of anxiety will decrease Outcome: Progressing   Problem: Pain Managment: Goal: General experience of comfort will improve and/or be controlled Outcome: Progressing

## 2024-02-04 NOTE — Assessment & Plan Note (Signed)
 Continue statins

## 2024-02-05 ENCOUNTER — Inpatient Hospital Stay (HOSPITAL_COMMUNITY): Payer: MEDICAID

## 2024-02-05 ENCOUNTER — Inpatient Hospital Stay (HOSPITAL_BASED_OUTPATIENT_CLINIC_OR_DEPARTMENT_OTHER): Payer: MEDICAID

## 2024-02-05 DIAGNOSIS — R55 Syncope and collapse: Secondary | ICD-10-CM

## 2024-02-05 LAB — RAPID URINE DRUG SCREEN, HOSP PERFORMED
Amphetamines: NOT DETECTED
Barbiturates: NOT DETECTED
Benzodiazepines: POSITIVE — AB
Cocaine: NOT DETECTED
Opiates: NOT DETECTED
Tetrahydrocannabinol: NOT DETECTED

## 2024-02-05 LAB — CBC
HCT: 31 % — ABNORMAL LOW (ref 39.0–52.0)
Hemoglobin: 10.1 g/dL — ABNORMAL LOW (ref 13.0–17.0)
MCH: 30.8 pg (ref 26.0–34.0)
MCHC: 32.6 g/dL (ref 30.0–36.0)
MCV: 94.5 fL (ref 80.0–100.0)
Platelets: 174 K/uL (ref 150–400)
RBC: 3.28 MIL/uL — ABNORMAL LOW (ref 4.22–5.81)
RDW: 13 % (ref 11.5–15.5)
WBC: 9.4 K/uL (ref 4.0–10.5)
nRBC: 0 % (ref 0.0–0.2)

## 2024-02-05 LAB — BASIC METABOLIC PANEL WITH GFR
Anion gap: 5 (ref 5–15)
BUN: 31 mg/dL — ABNORMAL HIGH (ref 6–20)
CO2: 21 mmol/L — ABNORMAL LOW (ref 22–32)
Calcium: 7.6 mg/dL — ABNORMAL LOW (ref 8.9–10.3)
Chloride: 107 mmol/L (ref 98–111)
Creatinine, Ser: 1.8 mg/dL — ABNORMAL HIGH (ref 0.61–1.24)
GFR, Estimated: 44 mL/min — ABNORMAL LOW (ref >60)
Glucose, Bld: 112 mg/dL — ABNORMAL HIGH (ref 70–99)
Potassium: 4.4 mmol/L (ref 3.5–5.1)
Sodium: 133 mmol/L — ABNORMAL LOW (ref 135–145)

## 2024-02-05 LAB — ECHOCARDIOGRAM COMPLETE
Area-P 1/2: 3.27 cm2
Height: 68 in
S' Lateral: 3.3 cm
Weight: 2310.42 [oz_av]

## 2024-02-05 LAB — GLUCOSE, CAPILLARY
Glucose-Capillary: 96 mg/dL (ref 70–99)
Glucose-Capillary: 89 mg/dL (ref 70–99)
Glucose-Capillary: 167 mg/dL — ABNORMAL HIGH (ref 70–99)
Glucose-Capillary: 154 mg/dL — ABNORMAL HIGH (ref 70–99)

## 2024-02-05 LAB — APTT: aPTT: 32 s (ref 24–36)

## 2024-02-05 NOTE — Evaluation (Signed)
 Physical Therapy Evaluation Patient Details Name: HERSHEL CORKERY MRN: [id number] DOB: 1970/12/08 Today's Date: 02/05/2024  History of Present Illness  FORTINO HAAG is a 53 year old male with extensive history of DM2, HTN, HLD, depression, anxiety, GERD, CAD, chronic bronchitis, COPD diabetic neuropathy, PVD,s/p amputation of fourth, fifth toe... Presented to the ED with chief complaint of right shoulder pain after having a syncopal episode.  Patient reports earlier this morning got up to go to the bathroom had a spontaneous passing out spell. Struck his head during the fall, but denies any neck or back pain.  Reports he might been out for maybe about a minute.  Checked his sugar was 238     Denies any recent illnesses, fever or chills, denies of having any asymmetric weakness.  Only on the right shoulder due to pain.  Denies any chest pain or shortness of breath or palpitation.  Has no recollection of the event, states he did not notice loss of bowel or bladder function.    Clinical Impression  On therapist arrival; patient lying in bed. Does not have his sling on his right shoulder stating it hurts more with it on while in bed.  Patient able to perform supine to sit with Supervision and demonstrates good balance sitting on the side of the bed.  Sit to stand with hand held assist from the therapist for safety; needs CGA to ambulate out in the hallway x 30 ft; noted slow gait speed and slightly unsteady due to amputated 3rd and 4th toes left foot.  Patient states he is started to feel light headed so we returned to the room.  Patient left in chair with chair alarm set with call button in reach,  and nursing notified of mobility status.  Patient will benefit from continued skilled therapy services during the remainder of his hospital stay and at the next recommended venue of care to address deficits and promote return to optimal function.           If plan is discharge home, recommend the  following: A little help with walking and/or transfers;A little help with bathing/dressing/bathroom   Can travel by private vehicle        Equipment Recommendations Cane  Recommendations for Other Services       Functional Status Assessment Patient has had a recent decline in their functional status and demonstrates the ability to make significant improvements in function in a reasonable and predictable amount of time.     Precautions / Restrictions Precautions Precautions: Fall Recall of Precautions/Restrictions: Intact Required Braces or Orthoses: Sling Restrictions Weight Bearing Restrictions Per Provider Order: Yes Other Position/Activity Restrictions: fx right shoulder      Mobility  Bed Mobility Overal bed mobility: Independent               Patient Response: Anxious  Transfers Overall transfer level: Needs assistance Equipment used: 1 person hand held assist Transfers: Sit to/from Stand Sit to Stand: Contact guard assist           General transfer comment: CGA for safety    Ambulation/Gait Ambulation/Gait assistance: Contact guard assist Gait Distance (Feet): 30 Feet Assistive device: 1 person hand held assist         General Gait Details: would benefit from cane  Stairs            Wheelchair Mobility     Tilt Bed Tilt Bed Patient Response: Anxious  Modified Rankin (Stroke Patients Only)  Balance Overall balance assessment: Needs assistance Sitting-balance support: Bilateral upper extremity supported Sitting balance-Leahy Scale: Good Sitting balance - Comments: good sitting balance     Standing balance-Leahy Scale: Fair Standing balance comment: fair standing balance with PT hand held assist                             Pertinent Vitals/Pain Pain Assessment Pain Assessment: 0-10 Pain Score: 8  Pain Location: right arm Pain Intervention(s): Limited activity within patient's tolerance, Monitored during  session    Home Living Family/patient expects to be discharged to:: Private residence Living Arrangements: Parent Available Help at Discharge: Family;Available 24 hours/day Type of Home: House Home Access: Level entry       Home Layout: One level Home Equipment: Pharmacist, hospital (2 wheels) Additional Comments: lives with his dad; states he often does a wash up; dad does the grocery shopping per his report    Prior Function Prior Level of Function : History of Falls (last six months)               ADLs Comments: states his dad drives mostly; expresses fear of driving and getting into the shower     Extremity/Trunk Assessment   Upper Extremity Assessment Upper Extremity Assessment: Defer to OT evaluation;Right hand dominant;RUE deficits/detail RUE Deficits / Details: fx right humerus    Lower Extremity Assessment Lower Extremity Assessment: Generalized weakness;LLE deficits/detail LLE Deficits / Details: amputated 4th and 5th toes    Cervical / Trunk Assessment Cervical / Trunk Assessment: Normal  Communication   Communication Communication: No apparent difficulties    Cognition Arousal: Alert Behavior During Therapy: WFL for tasks assessed/performed   PT - Cognitive impairments: No apparent impairments                         Following commands: Intact       Cueing       General Comments      Exercises     Assessment/Plan    PT Assessment Patient needs continued PT services  PT Problem List Decreased strength;Decreased activity tolerance;Decreased balance;Decreased mobility;Pain       PT Treatment Interventions DME instruction;Gait training;Functional mobility training;Therapeutic activities;Therapeutic exercise;Balance training;Patient/family education    PT Goals (Current goals can be found in the Care Plan section)  Acute Rehab PT Goals Patient Stated Goal: return home PT Goal Formulation: With patient Time For Goal  Achievement: 02/19/24 Potential to Achieve Goals: Good    Frequency Min 2X/week     Co-evaluation               AM-PAC PT 6 Clicks Mobility  Outcome Measure Help needed turning from your back to your side while in a flat bed without using bedrails?: None Help needed moving from lying on your back to sitting on the side of a flat bed without using bedrails?: None Help needed moving to and from a bed to a chair (including a wheelchair)?: A Little Help needed standing up from a chair using your arms (e.g., wheelchair or bedside chair)?: A Little Help needed to walk in hospital room?: A Little Help needed climbing 3-5 steps with a railing? : A Lot 6 Click Score: 19    End of Session   Activity Tolerance: Patient tolerated treatment well Patient left: in chair;with call bell/phone within reach;with chair alarm set Nurse Communication: Mobility status PT Visit Diagnosis: Unsteadiness on feet (R26.81);Other  abnormalities of gait and mobility (R26.89);History of falling (Z91.81);Pain Pain - Right/Left: Right Pain - part of body: Shoulder    Time: 8661-8593 PT Time Calculation (min) (ACUTE ONLY): 28 min   Charges:   PT Evaluation $PT Eval Moderate Complexity: 1 Mod   PT General Charges $$ ACUTE PT VISIT: 1 Visit         3:17 PM, 02/05/24 Torence Palmeri Small Sueann Brownley MPT [facility] [place] [phone] Ph:[phone]

## 2024-02-05 NOTE — Progress Notes (Signed)
  Echocardiogram 2D Echocardiogram has been performed.  Koleen KANDICE Popper, RDCS 02/05/2024, 12:07 PM

## 2024-02-05 NOTE — Progress Notes (Signed)
   02/05/24 1622  TOC Brief Assessment  Insurance and Status Reviewed  Patient has primary care physician Yes  Home environment has been reviewed From home  Prior level of function: Independent  Prior/Current Home Services No current home services  Social Drivers of Health Review SDOH reviewed interventions complete (smoking cessation added to AVS)  Readmission risk has been reviewed Yes  Transition of care needs no transition of care needs at this time   Transition of Care Department [facility]) has reviewed patient and no TOC needs have been identified at this time. We will continue to monitor patient advancement through interdisciplinary progression rounds. If new patient transition needs arise, please place a TOC consult.

## 2024-02-05 NOTE — Progress Notes (Signed)
 PROGRESS NOTE    Patient: [name] [name]                            PCP: [name], PA-C                    DOB: [birth year]            DOA: 02/04/2024 FMW:[id number]             DOS: 02/05/2024, 11:35 AM   LOS: 1 day   Date of Service: The patient was seen and examined on 02/05/2024  Subjective:   The patient was seen and examined this morning. Hemodynamically stable.  No issues overnight   Brief Narrative:   [name] [name] is a 53 year old male with extensive history of DM2, HTN, HLD, depression, anxiety, GERD, CAD, chronic bronchitis, COPD diabetic neuropathy, PVD,s/p amputation of fourth, fifth toe... Presented to the ED with chief complaint of right shoulder pain after having a syncopal episode.  Patient reports earlier this morning got up to go to the bathroom had a spontaneous passing out spell. Struck his head during the fall, but denies any neck or back pain.  Reports he might been out for maybe about a minute.  Checked his sugar was 238  Denies any recent illnesses, fever or chills, denies of having any asymmetric weakness.  Only on the right shoulder due to pain.  Denies any chest pain or shortness of breath or palpitation. Has no recollection of the event, states he did not notice loss of bowel or bladder function.   ED evaluation: Blood pressure (!) 143/85, pulse 85, temperature 97.9 F (36.6 C), RR 16, SpO2 99%.   Labs; WBC 19.7, hemoglobin 12.4, BUN 32, creatinine 2.00, troponin 18, 17, glucose 274 UA hazy, small hemoglobin-nitrites or ketones no bacteria, WBC 0-5 Imaging: CT head/cervical spine; no traumatic fractures, no abnormalities-chronic small vessel disease Right shoulder x-ray: Minimally impacted fracture of the right humeral surgical neck.      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)   PVD (peripheral vascular disease) (HCC)     Assessment and Plan: * Syncope and collapse - Monitor reviewed, negative for any dysrhythmias -Will continue to monitor - 2D echocardiogram:    -EKGs as needed - Bilateral carotid ultrasound :   -Elevated creatinine unable to obtain CTA, will get a VQ scan (As rate of syncope is high with acute PE) -VQ scan-negative for acute   -Orthostatic BP checks -Neurochecks -no signs of reported focal neurological findings, discontinue neurochecks  Humeral surgical neck fracture - On right shoulder x-ray, minimal impacted right humeral neck fracture -Patient has been evaluated in ED, sling has been placed -Continue to monitor closely -Follows in outpatient with PCP and Ortho as needed -As needed analgesics -PT OT  COPD GOLD 1/ AB - Not O2 dependent no signs of exacerbation continue as needed DuoNeb bronchodilators, home inhalers   Diabetes mellitus (HCC) - Reviewing home regimen, continue long-acting insulin  - Checking CBG q. ACHS, SSI coverage - A1c:  8.6     Renal insufficiency - Elevated BUN/creatinine-close to baseline (1.6-2.0) -Ruling out CKD possibly stage Iia  Lab Results  Component Value Date   CREATININE 1.80 (H) 02/05/2024   CREATININE 2.00 (H) 02/04/2024   CREATININE 2.14 (H) 07/20/2023    Gastroesophageal reflux disease  - Continue PPI  Leukocytosis  Improved-likely reactive, no signs of infection - Afebrile, normotensive,  - Lactic acid 1.4 - No source of infection-UA negative imaging within normal limits - No antibiotics  Right bundle branch block - Monitoring closely-on monitored bed -EKG as needed  Diabetic polyneuropathy (HCC) Continue Neurontin   PVD (peripheral  vascular disease) (HCC) - With history of toe amputations -Continuing aspirin , Plavix , statins, optimizing glycemic control  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  To his symptoms  Anxiety disorder - Minimizing use of benzodiazepine home medication indicative of as needed Xanax     --------------------------------------------------------------------------------------------------------------------------- Nutritional status:  The patient's BMI is: Body mass index is 21.96 kg/m. I agree with the assessment and plan as outlined  Nutrition Status:       ----------------------------------------------------------------------------------------------------------------------------  DVT prophylaxis:  heparin  injection 5,000 Units Start: 02/04/24 2200 SCDs Start: 02/04/24 1345   Code Status:   Code Status: Full Code  Family Communication: No family member present at bedside-  -Advance care planning has been discussed.   Admission status:   Status is: Observation The patient remains OBS appropriate and will d/c before 2 midnights.   Disposition: From  - home             Planning for discharge in 1-2 days   Procedures:   No admission procedures for hospital encounter.   Antimicrobials:  Anti-infectives (From admission, onward)    Start     Dose/Rate Route Frequency Ordered Stop   02/04/24 2200  doxycycline  (VIBRA -TABS) tablet 100 mg         100 mg Oral 2 times daily 02/04/24 1521          Medication:   ARIPiprazole   10 mg Oral q AM   aspirin  EC  81 mg Oral Q breakfast   atorvastatin   80 mg Oral QHS   clopidogrel   75 mg Oral Daily   doxycycline   100 mg Oral BID   famotidine   40 mg Oral QHS   gabapentin   300 mg Oral TID   heparin   5,000 Units Subcutaneous Q8H   insulin  aspart  0-15 Units Subcutaneous TID WC   insulin  aspart  0-5 Units Subcutaneous QHS   insulin  glargine-yfgn  12 Units Subcutaneous QHS   sodium chloride  flush  3 mL Intravenous Q12H   sodium chloride  flush  3 mL Intravenous Q12H    acetaminophen  **OR** acetaminophen , bisacodyl , hydrALAZINE , HYDROmorphone  (DILAUDID ) injection, ipratropium, levalbuterol , ondansetron  **OR** ondansetron  (ZOFRAN ) IV, oxyCODONE , senna-docusate, sodium phosphate , zolpidem    Objective:   Vitals:   02/04/24 2052 02/05/24 0029 02/05/24 0500 02/05/24 0558  BP: (!) 154/96 (!) 142/81  (!) 143/77  Pulse: 76 82  81  Resp: 18 18    Temp: 97.8 F (36.6 C) 98.4 F (36.9 C)  98.5 F (36.9  C)  TempSrc: Oral Oral  Oral  SpO2: 96% 96%  96%  Weight:   65.5 kg   Height:        Intake/Output Summary (Last 24 hours) at 02/05/2024 1135 Last data filed at 02/04/2024 2258 Gross per 24 hour  Intake 831.64 ml  Output --  Net 831.64 ml   Filed Weights   02/04/24 0917 02/04/24 1600 02/05/24 0500  Weight: 62.1 kg 63.8 kg 65.5 kg     Physical examination:   General:  AAO x 3,  cooperative, no distress;   HEENT:  Normocephalic, PERRL, otherwise with in Normal limits   Neuro:  CNII-XII intact. , normal motor and sensation, reflexes intact   Lungs:   Clear to auscultation BL, Respirations unlabored,  No wheezes / crackles  Cardio:    S1/S2, RRR, No murmure, No Rubs or Gallops   Abdomen:  Soft, non-tender, bowel sounds active all four quadrants, no guarding or peritoneal signs.  Muscular  skeletal:   Limited exam -global generalized weaknesses - in bed, able to move all 4  extremities,   2+ pulses,  symmetric, No pitting edema  Skin:  Dry, warm to touch, negative for any Rashes,  Wounds: Please see nursing documentation       ------------------------------------------------------------------------------------------------------------------------------------------    LABs:     Latest Ref Rng & Units 02/05/2024    4:24 AM 02/04/2024   10:08 AM 01/17/2024    1:18 PM  CBC  WBC 4.0 - 10.5 K/uL 9.4  19.7  14.7   Hemoglobin 13.0 - 17.0 g/dL 89.8  87.5  87.6   Hematocrit 39.0 - 52.0 % 31.0  37.0  37.4   Platelets 150 - 400 K/uL 174  232  249       Latest Ref Rng & Units 02/05/2024    4:24 AM 02/04/2024   10:08 AM 07/20/2023    7:58 AM  CMP  Glucose 70 - 99 mg/dL 887  725  836   BUN 6 - 20 mg/dL 31  32  26   Creatinine 0.61 - 1.24 mg/dL 8.19  7.99  7.85   Sodium 135 - 145 mmol/L 133  135  133   Potassium 3.5 - 5.1 mmol/L 4.4  4.7  4.3   Chloride 98 - 111 mmol/L 107  99  99   CO2 22 - 32 mmol/L 21  23  24    Calcium  8.9 - 10.3 mg/dL 7.6  8.4  8.1   Total Protein 6.5 - 8.1 g/dL  6.2    Total Bilirubin 0.0 - 1.2 mg/dL  1.5    Alkaline Phos 38 - 126 U/L  97    AST 15 - 41 U/L  15    ALT 0 - 44 U/L  14         Micro Results No results found for this or any previous visit (from the past 240 hours).  Radiology Reports DG CHEST PORT 1 VIEW Result Date: 02/04/2024 CLINICAL DATA:  Shortness of breath EXAM: PORTABLE CHEST 1 VIEW COMPARISON:  08/27/2022 FINDINGS: The heart size and mediastinal contours are within normal limits. Both lungs are clear. The visualized skeletal structures are unremarkable. IMPRESSION: No active disease. Electronically Signed   By: Franky Crease M.D.   On: 02/04/2024 18:31   NM Pulmonary Perfusion Result Date: 02/04/2024 CLINICAL DATA:  Syncope/presyncope, cerebrovascular cause suspected EXAM: NUCLEAR MEDICINE PERFUSION LUNG SCAN TECHNIQUE: Perfusion images were obtained in multiple projections after intravenous injection of  radiopharmaceutical. Ventilation scans  intentionally deferred if perfusion scan and chest x-ray adequate for interpretation during COVID 19 epidemic. RADIOPHARMACEUTICALS:  4.4 mCi Tc-74m MAA IV COMPARISON:  None Available. FINDINGS: No perfusion defects to suggest pulmonary embolus. IMPRESSION: No evidence of pulmonary embolus. Electronically Signed   By: Franky Crease M.D.   On: 02/04/2024 17:21    SIGNED: Adriana DELENA Grams, MD, FHM. FAAFP. [place] [facility] Time spent - 55 min.  In seeing, evaluating and examining the patient. Reviewing medical records, labs, drawn plan of care. [facility],  Pager (please use [email or web address] to page/ text) Please use Epic Secure Chat for non-urgent communication (7AM-7PM)  If 7PM-7AM, please contact night-coverage [email or web address], 02/05/2024, 11:35 AM

## 2024-02-05 NOTE — Plan of Care (Signed)
  Problem: Acute Rehab PT Goals(only PT should resolve) Goal: Pt will Roll Supine to Side Outcome: Progressing Flowsheets (Taken 02/05/2024 1519) Pt will Roll Supine to Side: Independently Goal: Patient Will Transfer Sit To/From Stand Outcome: Progressing Flowsheets (Taken 02/05/2024 1519) Patient will transfer sit to/from stand: with supervision Goal: Pt Will Transfer Bed To Chair/Chair To Bed Outcome: Progressing Flowsheets (Taken 02/05/2024 1519) Pt will Transfer Bed to Chair/Chair to Bed: with supervision Goal: Pt Will Ambulate Outcome: Progressing Flowsheets (Taken 02/05/2024 1519) Pt will Ambulate:  100 feet  with supervision  with cane

## 2024-02-06 DIAGNOSIS — I951 Orthostatic hypotension: Secondary | ICD-10-CM

## 2024-02-06 DIAGNOSIS — I739 Peripheral vascular disease, unspecified: Secondary | ICD-10-CM

## 2024-02-06 DIAGNOSIS — I10 Essential (primary) hypertension: Secondary | ICD-10-CM

## 2024-02-06 DIAGNOSIS — R55 Syncope and collapse: Secondary | ICD-10-CM | POA: Diagnosis not present

## 2024-02-06 DIAGNOSIS — I2510 Atherosclerotic heart disease of native coronary artery without angina pectoris: Secondary | ICD-10-CM

## 2024-02-06 DIAGNOSIS — E785 Hyperlipidemia, unspecified: Secondary | ICD-10-CM

## 2024-02-06 LAB — BASIC METABOLIC PANEL WITH GFR
Anion gap: 3 — ABNORMAL LOW (ref 5–15)
BUN: 25 mg/dL — ABNORMAL HIGH (ref 6–20)
CO2: 22 mmol/L (ref 22–32)
Calcium: 7.9 mg/dL — ABNORMAL LOW (ref 8.9–10.3)
Chloride: 112 mmol/L — ABNORMAL HIGH (ref 98–111)
Creatinine, Ser: 1.65 mg/dL — ABNORMAL HIGH (ref 0.61–1.24)
GFR, Estimated: 49 mL/min — ABNORMAL LOW (ref >60)
Glucose, Bld: 142 mg/dL — ABNORMAL HIGH (ref 70–99)
Potassium: 4.5 mmol/L (ref 3.5–5.1)
Sodium: 137 mmol/L (ref 135–145)

## 2024-02-06 LAB — CBC
HCT: 31.5 % — ABNORMAL LOW (ref 39.0–52.0)
Hemoglobin: 10.3 g/dL — ABNORMAL LOW (ref 13.0–17.0)
MCH: 30.6 pg (ref 26.0–34.0)
MCHC: 32.7 g/dL (ref 30.0–36.0)
MCV: 93.5 fL (ref 80.0–100.0)
Platelets: 178 K/uL (ref 150–400)
RBC: 3.37 MIL/uL — ABNORMAL LOW (ref 4.22–5.81)
RDW: 12.9 % (ref 11.5–15.5)
WBC: 9.2 K/uL (ref 4.0–10.5)
nRBC: 0 % (ref 0.0–0.2)

## 2024-02-06 LAB — GLUCOSE, CAPILLARY
Glucose-Capillary: 138 mg/dL — ABNORMAL HIGH (ref 70–99)
Glucose-Capillary: 125 mg/dL — ABNORMAL HIGH (ref 70–99)
Glucose-Capillary: 84 mg/dL (ref 70–99)

## 2024-02-06 MED ORDER — MIDODRINE HCL 5 MG PO TABS
5.0000 mg | ORAL_TABLET | Freq: Three times a day (TID) | ORAL | Status: DC
  Administered 2024-02-06: 5 mg via ORAL
  Filled 2024-02-06: qty 1

## 2024-02-06 MED ORDER — EZETIMIBE 10 MG PO TABS
10.0000 mg | ORAL_TABLET | Freq: Every day | ORAL | Status: DC
  Administered 2024-02-06: 10 mg via ORAL
  Filled 2024-02-06: qty 1

## 2024-02-06 MED ORDER — SERTRALINE HCL 50 MG PO TABS: 200.0000 mg | ORAL_TABLET | Freq: Every day | ORAL | Status: DC

## 2024-02-06 MED ORDER — MIDODRINE HCL 5 MG PO TABS: 5.0000 mg | ORAL_TABLET | Freq: Three times a day (TID) | ORAL | 0 refills | Status: AC

## 2024-02-06 MED ORDER — DOXYCYCLINE HYCLATE 100 MG PO TABS: 100.0000 mg | ORAL_TABLET | Freq: Two times a day (BID) | ORAL | 0 refills | Status: AC

## 2024-02-06 MED ORDER — AMLODIPINE BESYLATE 10 MG PO TABS: 10.0000 mg | ORAL_TABLET | Freq: Every day | ORAL | 1 refills | Status: AC

## 2024-02-06 MED ORDER — LACTATED RINGERS IV BOLUS
250.0000 mL | Freq: Once | INTRAVENOUS | Status: AC
  Administered 2024-02-06: 250 mL via INTRAVENOUS

## 2024-02-06 MED ORDER — AMLODIPINE BESYLATE 10 MG PO TABS: 10.0000 mg | ORAL_TABLET | Freq: Every day | ORAL | 1 refills | Status: DC

## 2024-02-06 MED ORDER — AMLODIPINE BESYLATE 5 MG PO TABS
10.0000 mg | ORAL_TABLET | Freq: Every day | ORAL | Status: DC
  Administered 2024-02-06: 10 mg via ORAL
  Filled 2024-02-06: qty 2

## 2024-02-06 NOTE — Discharge Summary (Addendum)
 Physician Discharge Summary   Patient: [name] [name] MRN: [id number] DOB: 1970-10-26  Admit date:     02/04/2024  Discharge date: 02/06/24  Discharge Physician: Adriana DELENA Grams   PCP: [name], PA-C   Recommendations at discharge:   Follow-up with the PCP in 1-2 weeks CMP in 1 week, continue monitoring kidney function with PCP Follow-up with cardiology, Holter monitor--- May need further workup for syncope Follow-up with orthopedic surgery in 1-2 weeks Continue wearing the sling per recommendation  All workup on this admissions were negative (including head imaging, carotid studies, ruled out infection, orthostatic BP improved) Continue taking current medication-current meds subject to change by PCP Monitor your blood pressure daily, keep a record report to PCP for adjustment of meds Monitor your blood sugar closely, keep record report to PCP for adjustment of meds  Discharge Diagnoses: Principal Problem:   Syncope and collapse Active Problems:   COPD GOLD 1/ AB   Humeral surgical neck fracture   Leukocytosis   Gastroesophageal reflux disease   Renal insufficiency   Diabetes mellitus (HCC)   Diabetic polyneuropathy (HCC)   Right bundle branch block   Anxiety disorder   Benign hypertension   Mixed hyperlipidemia   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 Presented to the ED with chief complaint of right shoulder pain after having a syncopal episode.  Patient reports earlier this morning got up to go to the bathroom had a  spontaneous passing out spell. Struck his head during the fall, but denies any neck or back pain.  Reports he might been out for maybe about a minute.  Checked his sugar was 238  Denies any recent illnesses, fever or chills, denies of having any asymmetric weakness.  Only on the right shoulder due to pain.  Denies any chest pain or shortness of breath or palpitation. Has no recollection of the event, states he did not notice loss of bowel or bladder function.   ED evaluation: Blood pressure (!) 143/85, pulse 85, temperature 97.9 F (36.6 C), RR 16, SpO2 99%.   Labs; WBC 19.7, hemoglobin 12.4, BUN 32, creatinine 2.00, troponin 18, 17, glucose 274 UA hazy, small hemoglobin-nitrites or ketones no bacteria, WBC 0-5 Imaging: CT head/cervical spine; no traumatic fractures, no abnormalities-chronic small vessel disease Right shoulder x-ray: Minimally impacted fracture of the right humeral surgical neck.    Hospital course:  * Syncope and collapse - Monitor reviewed, negative for any dysrhythmias   - 2D echocardiogram: Reviewed EF 55-60%, severe LVH, grade 1 diastolic dysfunction-no acute structural abnormalities  - Patient is to follow-up with cardiology - Curbside cardiology, recommended wearing Holter monitor     - Bilateral carotid ultrasound: Reviewed: Right carotid artery system: 50-69% stenosis secondary to moderate multifocal atherosclerotic plaque/ Left less than 50% stenosis   -Elevated creatinine unable to obtain CTA, will get a VQ scan (As rate of syncope is high with acute PE) -VQ scan-negative for acute     - Improved orthostatic hypotension -Neurochecks -no signs of reported focal neurological findings, discontinue neurochecks   Humeral surgical neck fracture - On right shoulder x-ray,  minimal impacted right humeral neck fracture -Patient has been evaluated in ED, sling has been placed -Follows in outpatient with PCP and Ortho -As needed analgesics -PT OT   COPD  GOLD 1/ AB - Not O2 dependent no signs of exacerbation continue as needed DuoNeb bronchodilators, home inhalers     Diabetes mellitus (HCC) -Continue home regimen, carb modified diet - A1c: 8.6     Renal insufficiency - Elevated BUN/creatinine-close to baseline (1.6-2.0) -Ruling out CKD possibly stage Iia Lab Results  Component Value Date   CREATININE 1.65 (H) 02/06/2024   CREATININE 1.80 (H) 02/05/2024   CREATININE 2.00 (H) 02/04/2024      Gastroesophageal reflux disease  - Continue PPI   Leukocytosis  -WBC 19.7 >>>> 9.2  - Afebrile, normotensive,  - Lactic acid 1.4 - No obvious source of infection-UA negative imaging within normal limits - Continue empiric Abx Doxycycline    Right bundle branch block - Monitoring closely-on monitored bed -EKG as needed   Diabetic polyneuropathy (HCC) -Discontinue Neurontin  for now   PVD (peripheral vascular disease) (HCC) - With history of toe amputations -Continuing aspirin , Plavix , statins, optimizing glycemic control   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) - Continuing aspirin , Plavix , statins   Mixed hyperlipidemia - Continue statins   Benign hypertension -history of hypotension - Discontinued midodrine , resume amlodipine    Polypharmacy  - Discussed with patient, recommended follow-up with the PCP and psychiatrist to modify and reduce current needed medications which will be contributors to his symptoms   Anxiety disorder - Minimizing use of benzodiazepine home medication indicative of as needed Xanax      ---------------------------------------------------------------------------------------------------------------- Nutritional status:  The patient's BMI is: Body mass index is 21.96 kg/m. I agree with the assessment and plan as outlined           Pain control - [place]  Controlled Substance Reporting System database was reviewed. and patient was instructed, not to  drive, operate heavy machinery, perform activities at heights, swimming or participation in water  activities or provide baby-sitting services while on Pain, Sleep and Anxiety Medications; until their outpatient Physician has advised to do so again. Also recommended to not to take more than prescribed Pain, Sleep and Anxiety Medications.  Consultants: Cardiology curbside consult  Disposition: Home Diet recommendation:  Discharge Diet Orders (From admission, onward)     Start     Ordered   02/06/24 0000  Diet - low sodium heart healthy        02/06/24 1045           Cardiac and Carb modified diet DISCHARGE MEDICATION: Allergies as of 02/06/2024       Reactions   Bee Venom Swelling   Morphine  Itching, Nausea And Vomiting   IV site   Nexium [esomeprazole] Swelling   Face swells, no breathing impairment   Prilosec Otc [omeprazole Magnesium ] Swelling   Face swells, no breathing impairment        Medication List     TAKE these medications    amLODipine  10 MG tablet Commonly known as: NORVASC  Take 1 tablet (10 mg total) by mouth at bedtime.   ARIPiprazole  10 MG tablet Commonly known as: ABILIFY  Take 10 mg by mouth in the morning.   aspirin  EC 81 MG tablet Take 81 mg by mouth daily with breakfast.   atorvastatin  80 MG tablet Commonly known as: LIPITOR  Take 1 tablet (80 mg total) by mouth at bedtime.   clopidogrel  75 MG tablet Commonly known as: Plavix  Take  1 tablet (75 mg total) by mouth daily.   doxycycline  100 MG tablet Commonly known as: VIBRA -TABS Take 1 tablet (100 mg total) by mouth 2 (two) times daily for 7 days.   ezetimibe  10 MG tablet Commonly known as: ZETIA  Take 1 tablet (10 mg total) by mouth daily.   famotidine  40 MG tablet Commonly known as: PEPCID  Take 1 tablet (40 mg total) by mouth 2 (two) times daily.   Lantus  SoloStar 100 UNIT/ML Solostar Pen Generic drug: insulin  glargine Inject 12 Units into the skin daily.   midodrine  5 MG  tablet Commonly known as: PROAMATINE  Take 1 tablet (5 mg total) by mouth 3 (three) times daily with meals.   sertraline  100 MG tablet Commonly known as: ZOLOFT  Take 2 tablets (200 mg total) by mouth at bedtime.        Discharge Exam: Filed Weights   02/04/24 1600 02/05/24 0500 02/06/24 0453  Weight: 63.8 kg 65.5 kg 65.6 kg        General:  AAO x 3,  cooperative, no distress;   HEENT:  Normocephalic, PERRL, otherwise with in Normal limits   Neuro:  CNII-XII intact. , normal motor and sensation, reflexes intact   Lungs:   Clear to auscultation BL, Respirations unlabored,  No wheezes / crackles  Cardio:    S1/S2, RRR, No murmure, No Rubs or Gallops   Abdomen:  Soft, non-tender, bowel sounds active all four quadrants, no guarding or peritoneal signs.  Muscular  skeletal:  Right humerus fracture therefore limited range of motion Limited exam -global generalized weaknesses - in bed, able to move all 4 extremities,   2+ pulses,  symmetric, No pitting edema  Skin:  Dry, warm to touch, negative for any Rashes,  Wounds: Please see nursing documentation         Condition at discharge: good  The results of significant diagnostics from this hospitalization (including imaging, microbiology, ancillary and laboratory) are listed below for reference.   Imaging Studies: US  Carotid Bilateral Result Date: 02/05/2024 CLINICAL DATA:  53 year old male with history of syncope and collapse. EXAM: BILATERAL CAROTID DUPLEX ULTRASOUND TECHNIQUE: Elnor scale imaging, color Doppler and duplex ultrasound were performed of bilateral carotid and vertebral arteries in the neck. COMPARISON:  None Available. FINDINGS: Criteria: Quantification of carotid stenosis is based on velocity parameters that correlate the residual internal carotid diameter with NASCET-based stenosis levels, using the diameter of the distal internal carotid lumen as the denominator for stenosis measurement. The following velocity  measurements were obtained: RIGHT ICA: Peak systolic velocity 157 cm/sec, End diastolic velocity 40 cm/sec CCA: Peak systolic velocity 97 cm/sec SYSTOLIC ICA/CCA RATIO:  1.6 ECA: Peak systolic velocity 154 cm/sec LEFT ICA: Peak systolic velocity 128 cm/sec, End diastolic velocity 27 cm/sec CCA: 98 cm/sec SYSTOLIC ICA/CCA RATIO:  1.3 ECA: 109 cm/sec RIGHT CAROTID ARTERY: Moderate multifocal atherosclerotic plaque formation, most prominent about the carotid bulb and bifurcation. No significant tortuosity. Normal low resistance waveforms. RIGHT VERTEBRAL ARTERY:  Antegrade flow. LEFT CAROTID ARTERY: Mild multifocal atherosclerotic plaque formation, most prominent about the carotid bulb and bifurcation. No significant tortuosity. Normal low resistance waveforms. LEFT VERTEBRAL ARTERY:  Antegrade flow. Upper extremity non-invasive blood pressures: Not obtained. IMPRESSION: 1. Right carotid artery system: 50-69% stenosis secondary to moderate multifocal atherosclerotic plaque formation most prominent about the carotid bulb and bifurcation. 2. Left carotid artery system: Less than 50% stenosis secondary to mild multifocal atherosclerotic plaque formation. 3.  Vertebral artery system: Patent with antegrade flow bilaterally. Ester Sides, MD [facility] Electronically Signed   By: Ester Sides M.D.   On: 02/05/2024 14:39   ECHOCARDIOGRAM COMPLETE Result Date: 02/05/2024    ECHOCARDIOGRAM REPORT   Patient Name:   JESUSMANUEL ERBES Date of Exam: 02/05/2024 Medical Rec #:  [id number]       Height:       68.0 in Accession #:    [id number]      Weight:       144.4 lb Date of Birth:  Aug 01, 1970       BSA:          1.780 m Patient Age:    53 years        BP:           143/77 mmHg Patient Gender: M               HR:           78 bpm. Exam Location:  [facility] Procedure: 2D Echo, Color Doppler and Cardiac Doppler (Both Spectral and Color            Flow Doppler were utilized  during procedure). Indications:    Syncope R55  History:        Patient has prior history of Echocardiogram examinations, most                 recent 08/28/2022. CAD, COPD; Risk Factors:Hypertension,                 Dyslipidemia, Diabetes, Sleep Apnea and Current Smoker.  Sonographer:    Koleen Popper RDCS Referring Phys: [phone] Kimaria Struthers A Mariachristina Holle IMPRESSIONS  1. Left ventricular ejection fraction, by estimation, is 55 to 60%. The left ventricle has normal function. Left ventricular endocardial border not optimally defined to evaluate regional wall motion. There is severe asymmetric left ventricular hypertrophy of the lateral segment. Left ventricular diastolic parameters are consistent with Grade I diastolic dysfunction (impaired relaxation). Elevated left ventricular end-diastolic pressure.  2. Right ventricular systolic function was not well visualized. The right ventricular size is normal. Tricuspid regurgitation signal is inadequate for assessing PA pressure.  3. The mitral valve is grossly normal. No evidence of mitral valve regurgitation. No evidence of mitral stenosis.  4. The aortic valve was not well visualized. Aortic valve regurgitation is not visualized. No aortic stenosis is present. Comparison(s): No significant change from prior study. FINDINGS  Left Ventricle: Left ventricular ejection fraction, by estimation, is 55 to 60%. The left ventricle has normal function. Left ventricular endocardial border not optimally defined to evaluate regional wall motion. Strain was performed and the global longitudinal strain is indeterminate. The left ventricular internal cavity size was normal in size. There is severe asymmetric left ventricular hypertrophy of the lateral segment. Left ventricular diastolic parameters are consistent with Grade I diastolic dysfunction (impaired relaxation). Elevated left ventricular end-diastolic pressure. Right Ventricle: The right ventricular size is normal. No increase in right  ventricular wall thickness. Right ventricular systolic function was not well visualized. Tricuspid regurgitation signal is inadequate for assessing PA pressure. Left Atrium: Left atrial size was normal in size. Right Atrium: Right atrial size was normal in size. Pericardium: There is no evidence of pericardial effusion. Mitral Valve: The mitral valve is grossly normal. No evidence of mitral valve regurgitation. No evidence of mitral valve stenosis. Tricuspid Valve: The tricuspid valve is not well visualized. Tricuspid valve regurgitation is not demonstrated. No evidence of tricuspid stenosis. Aortic Valve: The aortic valve was not  well visualized. Aortic valve regurgitation is not visualized. No aortic stenosis is present. Pulmonic Valve: The pulmonic valve was not well visualized. Pulmonic valve regurgitation is not visualized. No evidence of pulmonic stenosis. Aorta: The aortic root is normal in size and structure. Venous: The inferior vena cava was not well visualized. IAS/Shunts: No atrial level shunt detected by color flow Doppler. Additional Comments: 3D was performed not requiring image post processing on an independent workstation and was indeterminate.  LEFT VENTRICLE PLAX 2D LVIDd:         4.10 cm   Diastology LVIDs:         3.30 cm   LV e' medial:    4.56 cm/s LV PW:         1.60 cm   LV E/e' medial:  16.8 LV IVS:        1.20 cm   LV e' lateral:   7.06 cm/s LVOT diam:     1.80 cm   LV E/e' lateral: 10.8 LV SV:         34 LV SV Index:   19 LVOT Area:     2.54 cm  RIGHT VENTRICLE            IVC RV S prime:     9.53 cm/s  IVC diam: 1.70 cm TAPSE (M-mode): 1.4 cm LEFT ATRIUM             Index        RIGHT ATRIUM           Index LA diam:        3.20 cm 1.80 cm/m   RA Area:     11.00 cm LA Vol (A2C):   24.7 ml 13.88 ml/m  RA Volume:   24.10 ml  13.54 ml/m LA Vol (A4C):   32.4 ml 18.21 ml/m LA Biplane Vol: 30.7 ml 17.25 ml/m  AORTIC VALVE LVOT Vmax:   73.30 cm/s LVOT Vmean:  46.800 cm/s LVOT VTI:    0.134  m  AORTA Ao Root diam: 2.90 cm MITRAL VALVE MV Area (PHT): 3.27 cm    SHUNTS MV Decel Time: 232 msec    Systemic VTI:  0.13 m MV E velocity: 76.60 cm/s  Systemic Diam: 1.80 cm MV A velocity: 89.90 cm/s MV E/A ratio:  0.85 Vishnu Priya Mallipeddi Electronically signed by Diannah Late Mallipeddi Signature Date/Time: 02/05/2024/2:12:38 PM    Final    DG CHEST PORT 1 VIEW Result Date: 02/04/2024 CLINICAL DATA:  Shortness of breath EXAM: PORTABLE CHEST 1 VIEW COMPARISON:  08/27/2022 FINDINGS: The heart size and mediastinal contours are within normal limits. Both lungs are clear. The visualized skeletal structures are unremarkable. IMPRESSION: No active disease. Electronically Signed   By: Franky Crease M.D.   On: 02/04/2024 18:31   NM Pulmonary Perfusion Result Date: 02/04/2024 CLINICAL DATA:  Syncope/presyncope, cerebrovascular cause suspected EXAM: NUCLEAR MEDICINE PERFUSION LUNG SCAN TECHNIQUE: Perfusion images were obtained in multiple projections after intravenous injection of radiopharmaceutical. Ventilation scans intentionally deferred if perfusion scan and chest x-ray adequate for interpretation during COVID 19 epidemic. RADIOPHARMACEUTICALS:  4.4 mCi Tc-67m MAA IV COMPARISON:  None Available. FINDINGS: No perfusion defects to suggest pulmonary embolus. IMPRESSION: No evidence of pulmonary embolus. Electronically Signed   By: Franky Crease M.D.   On: 02/04/2024 17:21   CT Head Wo Contrast Result Date: 02/04/2024 CLINICAL DATA:  Syncope/presyncope, cerebrovascular cause suspected; Ataxia, cervical trauma. Fall. Right shoulder pain. EXAM: CT HEAD WITHOUT CONTRAST CT CERVICAL SPINE WITHOUT CONTRAST  TECHNIQUE: Multidetector CT imaging of the head and cervical spine was performed following the standard protocol without intravenous contrast. Multiplanar CT image reconstructions of the cervical spine were also generated. RADIATION DOSE REDUCTION: This exam was performed according to the departmental dose-optimization  program which includes automated exposure control, adjustment of the mA and/or kV according to patient size and/or use of iterative reconstruction technique. COMPARISON:  Head CT 02/08/2023 and MRI 09/06/2023 FINDINGS: CT HEAD FINDINGS Brain: There is no evidence of an acute infarct, intracranial hemorrhage, mass, midline shift, or extra-axial fluid collection. There is mild cerebral atrophy. The ventricles are normal in size. Cerebral white matter hypodensities are nonspecific but compatible with mild chronic small vessel ischemic disease. Vascular: Calcified atherosclerosis at the skull base. No hyperdense vessel. Skull: No fracture or suspicious lesion. Sinuses/Orbits: Visualized paranasal sinuses and mastoid air cells are clear. Unremarkable orbits. Other: None. CT CERVICAL SPINE FINDINGS Alignment: Reversal of the normal cervical lordosis. No significant listhesis. Skull base and vertebrae: No acute fracture or suspicious lesion. Soft tissues and spinal canal: No prevertebral fluid or swelling. No visible canal hematoma. Disc levels: Focally advanced disc degeneration at C5-6 and C6-7 with severe disc space narrowing, degenerative endplate sclerosis, and spurring. Mild spinal stenosis at both levels. Asymmetrically severe left neural foraminal stenosis at C6-7. Upper chest: Clear lung apices. Other: Mild atherosclerotic calcification at the right greater than left carotid bifurcations. IMPRESSION: 1. No evidence of acute intracranial abnormality or cervical spine fracture. 2. Mild chronic small vessel ischemic disease. Electronically Signed   By: Dasie Hamburg M.D.   On: 02/04/2024 11:07   CT Cervical Spine Wo Contrast Result Date: 02/04/2024 CLINICAL DATA:  Syncope/presyncope, cerebrovascular cause suspected; Ataxia, cervical trauma. Fall. Right shoulder pain. EXAM: CT HEAD WITHOUT CONTRAST CT CERVICAL SPINE WITHOUT CONTRAST TECHNIQUE: Multidetector CT imaging of the head and cervical spine was performed  following the standard protocol without intravenous contrast. Multiplanar CT image reconstructions of the cervical spine were also generated. RADIATION DOSE REDUCTION: This exam was performed according to the departmental dose-optimization program which includes automated exposure control, adjustment of the mA and/or kV according to patient size and/or use of iterative reconstruction technique. COMPARISON:  Head CT 02/08/2023 and MRI 09/06/2023 FINDINGS: CT HEAD FINDINGS Brain: There is no evidence of an acute infarct, intracranial hemorrhage, mass, midline shift, or extra-axial fluid collection. There is mild cerebral atrophy. The ventricles are normal in size. Cerebral white matter hypodensities are nonspecific but compatible with mild chronic small vessel ischemic disease. Vascular: Calcified atherosclerosis at the skull base. No hyperdense vessel. Skull: No fracture or suspicious lesion. Sinuses/Orbits: Visualized paranasal sinuses and mastoid air cells are clear. Unremarkable orbits. Other: None. CT CERVICAL SPINE FINDINGS Alignment: Reversal of the normal cervical lordosis. No significant listhesis. Skull base and vertebrae: No acute fracture or suspicious lesion. Soft tissues and spinal canal: No prevertebral fluid or swelling. No visible canal hematoma. Disc levels: Focally advanced disc degeneration at C5-6 and C6-7 with severe disc space narrowing, degenerative endplate sclerosis, and spurring. Mild spinal stenosis at both levels. Asymmetrically severe left neural foraminal stenosis at C6-7. Upper chest: Clear lung apices. Other: Mild atherosclerotic calcification at the right greater than left carotid bifurcations. IMPRESSION: 1. No evidence of acute intracranial abnormality or cervical spine fracture. 2. Mild chronic small vessel ischemic disease. Electronically Signed   By: Dasie Hamburg M.D.   On: 02/04/2024 11:07   DG Wrist Complete Right Result Date: 02/04/2024 CLINICAL DATA:  Right wrist pain after  fall. EXAM: RIGHT  WRIST - COMPLETE 3+ VIEW COMPARISON:  None Available. FINDINGS: There is no evidence of fracture or dislocation. There is no evidence of arthropathy or other focal bone abnormality. Soft tissues are unremarkable. IMPRESSION: Negative. Electronically Signed   By: Lynwood Landy Raddle M.D.   On: 02/04/2024 10:20   DG Elbow Complete Right Result Date: 02/04/2024 CLINICAL DATA:  Right elbow pain after fall. EXAM: RIGHT ELBOW - COMPLETE 3+ VIEW COMPARISON:  None Available. FINDINGS: There is no evidence of fracture, dislocation, or joint effusion. There is no evidence of arthropathy or other focal bone abnormality. Soft tissues are unremarkable. IMPRESSION: Negative. Electronically Signed   By: Lynwood Landy Raddle M.D.   On: 02/04/2024 10:17   DG Shoulder Right Result Date: 02/04/2024 CLINICAL DATA:  Status post fall with right shoulder pain EXAM: RIGHT SHOULDER - 3 VIEW COMPARISON:  None Available. FINDINGS: Minimally impacted fracture of the right humeral surgical neck. Small displaced fracture fragment projects along the medial aspect of the neck. No acute dislocation of the glenohumeral joint. Diffuse soft tissue swelling surrounding the right shoulder. IMPRESSION: Minimally impacted fracture of the right humeral surgical neck. Electronically Signed   By: Limin  Xu M.D.   On: 02/04/2024 10:16   VAS US  ABI WITH/WO TBI Result Date: 01/28/2024  LOWER EXTREMITY DOPPLER STUDY Patient Name:  GREYSON PEAVY  Date of Exam:   01/27/2024 Medical Rec #: [id number]        Accession #:    [id number] Date of Birth: 1971/01/03        Patient Gender: M Patient Age:   77 years Exam Location:  [address] Procedure:      VAS US  ABI WITH/WO TBI Referring Phys: --------------------------------------------------------------------------------  Indications: Claudication, and peripheral artery disease. PA note 07/29/23:              Although the patient has greater than 75% stenosis in the proximal              popliteal  artery, we will continue to monitor his studies rather              than repeat intervention at this time. His amputation site is              healing well with his current vascular status. Dr. Pearline              explained to the patient that this stenosis may be due to              inflammation after revascularization. We have also explained to the              patient that he may require repeat intervention if his amputation              site does not heal. He can follow-up with our office in 3 months              with repeat ABIs and left lower extremity arterial duplex High Risk Factors: Hypertension, hyperlipidemia, Diabetes, current smoker,                    coronary artery disease.  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 Performing Technologist: Devere Dark RVT  Examination Guidelines: A complete evaluation includes at minimum, Doppler waveform  signals and systolic blood pressure reading at the level of bilateral brachial, anterior tibial, and posterior tibial arteries, when vessel segments are accessible. Bilateral testing is considered an integral part of a complete examination. Photoelectric Plethysmograph (PPG) waveforms and toe systolic pressure readings are included as required and additional duplex testing as needed. Limited examinations for reoccurring indications may be performed as noted.  ABI Findings: +---------+------------------+-----+----------+--------+ Right    Rt Pressure (mmHg)IndexWaveform  Comment  +---------+------------------+-----+----------+--------+ Brachial 140                                       +---------+------------------+-----+----------+--------+ PTA      155               1.10 monophasicbrisk    +---------+------------------+-----+----------+--------+ DP       154               1.09 biphasic            +---------+------------------+-----+----------+--------+ Great Toe115               0.82 Normal             +---------+------------------+-----+----------+--------+ +---------+------------------+-----+----------+-------+ Left     Lt Pressure (mmHg)IndexWaveform  Comment +---------+------------------+-----+----------+-------+ Brachial 141                                      +---------+------------------+-----+----------+-------+ PTA      159               1.13 monophasicbrisk   +---------+------------------+-----+----------+-------+ DP       165               1.17 biphasic          +---------+------------------+-----+----------+-------+ Great Toe113               0.80 Normal            +---------+------------------+-----+----------+-------+ +-------+-----------+-----------+------------+------------+ ABI/TBIToday's ABIToday's TBIPrevious ABIPrevious TBI +-------+-----------+-----------+------------+------------+ Right  1.10       0.82       1.06        0.7          +-------+-----------+-----------+------------+------------+ Left   1.17       0.80       1.07        0.7          +-------+-----------+-----------+------------+------------+  Bilateral ABIs and TBIs appear essentially unchanged.  Summary: Right: Resting right ankle-brachial index is within normal range. The right toe-brachial index is normal.  Left: Resting left ankle-brachial index is within normal range. The left toe-brachial index is normal.  *See table(s) above for measurements and observations.  Electronically signed by Debby Robertson on 01/28/2024 at 12:21:39 PM.    Final    VAS US  LOWER EXTREMITY ARTERIAL DUPLEX Result Date: 01/28/2024 LOWER EXTREMITY ARTERIAL DUPLEX STUDY Patient Name:  KYRIN GRATZ  Date of Exam:   01/27/2024 Medical Rec #: [id number]        Accession #:    [id number] Date of Birth: Apr 27, 1971        Patient Gender: M Patient Age:   18 years Exam Location:  [address]  Procedure:      VAS US  LOWER EXTREMITY ARTERIAL DUPLEX Referring Phys: NORMAN SERVE --------------------------------------------------------------------------------  Indications: Claudication, peripheral artery disease, and PA note 07/29/23:  Although the patient has greater than 75% stenosis in the proximal              popliteal artery, we will continue to monitor his studies rather              than repeat intervention at this time. His amputation site is              healing well with his current vascular status. Dr. Pearline              explained to the patient that this stenosis may be due to              inflammation after revascularization. We have also explained to the              patient that he may require repeat intervention if his amputation              site does not heal. He can follow-up with our office in 3 months              with repeat ABIs and left lower extremity arterial duplex. High Risk Factors: Hypertension, hyperlipidemia, Diabetes, current smoker,                    coronary artery disease.  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 Current ABI:            Right ABI 1.10 Left 1.17 Performing Technologist: Devere Dark RVT  Examination Guidelines: A complete evaluation includes B-mode imaging, spectral Doppler, color Doppler, and power Doppler as needed of all accessible portions of each vessel. Bilateral testing is considered an integral part of a complete examination. Limited examinations for reoccurring indications may be performed as noted.   +-----------+--------+-----+---------------+----------+-----------------+ LEFT       PSV cm/sRatioStenosis       Waveform  Comments          +-----------+--------+-----+---------------+----------+-----------------+ CFA Prox   103                         triphasic                    +-----------+--------+-----+---------------+----------+-----------------+ CFA Distal 98                          triphasic                   +-----------+--------+-----+---------------+----------+-----------------+ DFA        138                         biphasic                    +-----------+--------+-----+---------------+----------+-----------------+ SFA Prox   201          50-74% stenosisbiphasic  homogenous plaque +-----------+--------+-----+---------------+----------+-----------------+ SFA Mid    126                         biphasic                    +-----------+--------+-----+---------------+----------+-----------------+ SFA Distal 52  biphasic                    +-----------+--------+-----+---------------+----------+-----------------+ POP Prox   150                         biphasic                    +-----------+--------+-----+---------------+----------+-----------------+ POP Mid    123                         biphasic                    +-----------+--------+-----+---------------+----------+-----------------+ POP Distal 119                         biphasic                    +-----------+--------+-----+---------------+----------+-----------------+ TP Trunk   94                          biphasic                    +-----------+--------+-----+---------------+----------+-----------------+ ATA Distal 46                          biphasic                    +-----------+--------+-----+---------------+----------+-----------------+ PTA Distal 18                          monophasic                  +-----------+--------+-----+---------------+----------+-----------------+ PERO Distal40                          biphasic                    +-----------+--------+-----+---------------+----------+-----------------+  Summary: Left: 50-74% stenosis (lower end of range) noted in the superficial femoral artery.  Unable to obtain increased velocity throughout the popliteal artery as on prior exam 07/29/2023  See table(s) above for measurements and observations. Electronically signed by Debby Robertson on 01/28/2024 at 12:20:51 PM.    Final     Microbiology: Results for orders placed or performed during the hospital encounter of 06/24/23  Surgical pcr screen     Status: None   Collection Time: 06/24/23  6:43 AM   Specimen: Nasal Mucosa; Nasal Swab  Result Value Ref Range Status   MRSA, PCR NEGATIVE NEGATIVE Final   Staphylococcus aureus NEGATIVE NEGATIVE Final    Comment: (NOTE) The Xpert SA Assay (FDA approved for NASAL specimens in patients 9 years of age and older), is one component of a comprehensive surveillance program. It is not intended to diagnose infection nor to guide or monitor treatment. Performed at [facility], [address], [place], [place] [address]     Labs: CBC: Recent Labs  Lab 02/04/24 1008 02/05/24 0424 02/06/24 0501  WBC 19.7* 9.4 9.2  HGB 12.4* 10.1* 10.3*  HCT 37.0* 31.0* 31.5*  MCV 93.7 94.5 93.5  PLT 232 174 178   Basic Metabolic Panel: Recent Labs  Lab 02/04/24 1008 02/05/24 0424 02/06/24 0501  NA 135 133* 137  K 4.7 4.4  4.5  CL 99 107 112*  CO2 23 21* 22  GLUCOSE 274* 112* 142*  BUN 32* 31* 25*  CREATININE 2.00* 1.80* 1.65*  CALCIUM  8.4* 7.6* 7.9*   Liver Function Tests: Recent Labs  Lab 02/04/24 1008  AST 15  ALT 14  ALKPHOS 97  BILITOT 1.5*  PROT 6.2*  ALBUMIN  3.0*   CBG: Recent Labs  Lab 02/05/24 1108 02/05/24 1626 02/05/24 2053 02/06/24 0729 02/06/24 1125  GLUCAP 89 167* 154* 138* 125*    Discharge time spent: greater than 30 minutes.  Signed: Adriana DELENA Grams, MD [facility] 02/06/2024

## 2024-02-06 NOTE — Progress Notes (Signed)
 Mobility Specialist Progress Note:    02/06/24 1445  Mobility  Activity Ambulated with assistance  Level of Assistance Contact guard assist, steadying assist  Assistive Device Front wheel walker  Distance Ambulated (ft) 100 ft  Range of Motion/Exercises Active;All extremities  RUE Weight Bearing Per Provider Order WBAT  Activity Response Tolerated well  Mobility Referral Yes  Mobility visit 1 Mobility  Mobility Specialist Start Time (ACUTE ONLY) 1445  Mobility Specialist Stop Time (ACUTE ONLY) 1505  Mobility Specialist Time Calculation (min) (ACUTE ONLY) 20 min   Pt received in bed, agreeable to mobility. Required CGA to stand and ambulate with no AD. Tolerated well, became lightheaded/ dizzy and nurse had to bring wheelchair to return pt to room. Returned supine, all needs met.  Drisana Schweickert Mobility Specialist Please contact via Special educational needs teacher or  Rehab office at [phone]

## 2024-02-06 NOTE — Evaluation (Signed)
 Occupational Therapy Evaluation Patient Details Name: [name] [name] MRN: [id number] DOB: Oct 26, 1970 Today's Date: 02/06/2024   History of Present Illness   [name] [name] is a 53 year old male with extensive history of DM2, HTN, HLD, depression, anxiety, GERD, CAD, chronic bronchitis, COPD diabetic neuropathy, PVD,s/p amputation of fourth, fifth toe... Presented to the ED with chief complaint of right shoulder pain after having a syncopal episode.  Patient reports earlier this morning got up to go to the bathroom had a spontaneous passing out spell. Struck his head during the fall, but denies any neck or back pain.  Reports he might been out for maybe about a minute.  Checked his sugar was 238 (per MD)     Clinical Impressions Pt agreeable to OT evaluation. Pt assisted to don sling on R UE. L UE generally weak. Pt able to don sock without assist using L UE. Pt able to ambulate to the toilet with supervision assist. Pt reports 2 falls in the past 6 months. Pt will need outpatient OT follow up since pt reports home health is unlikely to be allowed by his father who he lives with. Pt was left in the chair with chair alarm set. Pt will benefit from continued OT in the hospital and recommended venue below to increase strength, balance, and endurance for safe ADL's.        If plan is discharge home, recommend the following:   A little help with walking and/or transfers     Functional Status Assessment   Patient has had a recent decline in their functional status and demonstrates the ability to make significant improvements in function in a reasonable and predictable amount of time.     Equipment Recommendations   None recommended by OT             Precautions/Restrictions   Precautions Precautions: Fall Recall of Precautions/Restrictions: Intact Restrictions Weight Bearing Restrictions Per Provider Order: Yes RUE Weight Bearing Per Provider Order: Non weight bearing Other  Position/Activity Restrictions: fx right shoulder; R shoulder sling.     Mobility Bed Mobility Overal bed mobility: Independent                  Transfers Overall transfer level: Needs assistance   Transfers: Sit to/from Stand Sit to Stand: Supervision           General transfer comment: Supervision sit to stand; ambualted to toilet; somewhat unsteady.      Balance Overall balance assessment: Needs assistance Sitting-balance support: No upper extremity supported, Feet supported Sitting balance-Leahy Scale: Good Sitting balance - Comments: good sitting balance   Standing balance support: No upper extremity supported, During functional activity Standing balance-Leahy Scale: Fair Standing balance comment: without AD                           ADL either performed or assessed with clinical judgement   ADL Overall ADL's : Needs assistance/impaired     Grooming: Modified independent;Sitting   Upper Body Bathing: Modified independent;Sitting   Lower Body Bathing: Modified independent;Sitting/lateral leans   Upper Body Dressing : Modified independent;Sitting   Lower Body Dressing: Modified independent;Sitting/lateral leans   Toilet Transfer: Supervision/safety;Ambulation Toilet Transfer Details (indicate cue type and reason): EOB to toilet ambulation without AD; mildly unsteady         Functional mobility during ADLs: Supervision/safety       Vision Baseline Vision/History: 1 Wears glasses Ability to See in Adequate Light: 1  Impaired Patient Visual Report: No change from baseline Vision Assessment?: No apparent visual deficits     Perception Perception: Not tested       Praxis Praxis: Not tested       Pertinent Vitals/Pain Pain Assessment Pain Assessment: Faces Faces Pain Scale: Hurts little more Pain Location: All over Pain Descriptors / Indicators: Discomfort Pain Intervention(s): Monitored during session, Repositioned      Extremity/Trunk Assessment Upper Extremity Assessment Upper Extremity Assessment: LUE deficits/detail;RUE deficits/detail RUE Deficits / Details: fx right humerus; assist to don sling; verbal education on sling positioning. 3+/5 grip strength. LUE Deficits / Details: Generally weak.   Lower Extremity Assessment Lower Extremity Assessment: Defer to PT evaluation   Cervical / Trunk Assessment Cervical / Trunk Assessment: Normal   Communication Communication Communication: No apparent difficulties   Cognition Arousal: Alert Behavior During Therapy: WFL for tasks assessed/performed Cognition: No apparent impairments                               Following commands: Intact       Cueing  General Comments   Cueing Techniques: Verbal cues;Tactile cues                 Home Living Family/patient expects to be discharged to:: Private residence Living Arrangements: Parent Available Help at Discharge: Family Type of Home: House Home Access: Level entry     Home Layout: One level     Bathroom Shower/Tub: Chief Strategy Officer: Standard Bathroom Accessibility: Yes   Home Equipment: Pharmacist, hospital (2 wheels)   Additional Comments: Pt reports he helps his father. Reports his father cannot physically assist him.      Prior Functioning/Environment Prior Level of Function : History of Falls (last six months)             Mobility Comments: PRN use of walking stick; reports dizziness when walking in the grocery store. ADLs Comments: Independent    OT Problem List: Decreased strength;Decreased range of motion;Impaired balance (sitting and/or standing)   OT Treatment/Interventions: Self-care/ADL training;Therapeutic exercise;Therapeutic activities;Patient/family education;Balance training      OT Goals(Current goals can be found in the care plan section)   Acute Rehab OT Goals Patient Stated Goal: return home OT Goal  Formulation: With patient Time For Goal Achievement: 02/20/24 Potential to Achieve Goals: Good   OT Frequency:  Min 1X/week                  AM-PAC OT 6 Clicks Daily Activity     Outcome Measure Help from another person eating meals?: None Help from another person taking care of personal grooming?: None Help from another person toileting, which includes using toliet, bedpan, or urinal?: None Help from another person bathing (including washing, rinsing, drying)?: None Help from another person to put on and taking off regular upper body clothing?: None Help from another person to put on and taking off regular lower body clothing?: None 6 Click Score: 24   End of Session    Activity Tolerance: Patient tolerated treatment well Patient left: in chair;with call bell/phone within reach;with chair alarm set  OT Visit Diagnosis: Other abnormalities of gait and mobility (R26.89);Unsteadiness on feet (R26.81);Muscle weakness (generalized) (M62.81)                Time: 8963-8947 OT Time Calculation (min): 16 min Charges:  OT General Charges $OT Visit: 1 Visit OT Evaluation $OT Eval  Low Complexity: 1 Low  Brissia Delisa OT, MOT  Jayson Person 02/06/2024, 11:07 AM

## 2024-02-06 NOTE — Plan of Care (Signed)
  Problem: Education: Goal: Knowledge of General Education information will improve Description: Including pain rating scale, medication(s)/side effects and non-pharmacologic comfort measures Outcome: Progressing   Problem: Health Behavior/Discharge Planning: Goal: Ability to manage health-related needs will improve Outcome: Progressing   Problem: Clinical Measurements: Goal: Ability to maintain clinical measurements within normal limits will improve Outcome: Progressing Goal: Will remain free from infection Outcome: Progressing Goal: Diagnostic test results will improve Outcome: Progressing Goal: Cardiovascular complication will be avoided Outcome: Progressing   Problem: Activity: Goal: Risk for activity intolerance will decrease Outcome: Progressing   Problem: Nutrition: Goal: Adequate nutrition will be maintained Outcome: Progressing   Problem: Coping: Goal: Level of anxiety will decrease Outcome: Progressing   Problem: Elimination: Goal: Will not experience complications related to urinary retention Outcome: Progressing   Problem: Pain Managment: Goal: General experience of comfort will improve and/or be controlled Outcome: Progressing   Problem: Safety: Goal: Ability to remain free from injury will improve Outcome: Progressing   Problem: Skin Integrity: Goal: Risk for impaired skin integrity will decrease Outcome: Progressing

## 2024-02-06 NOTE — Consult Note (Signed)
 Cardiology Consultation   Patient ID: [name] [name] MRN: [id number]; DOB: 1970/08/17  Admit date: 02/04/2024 Date of Consult: 02/06/2024  PCP:  [name], PA-C   [facility] Providers Cardiologist:  Jayson Sierras, MD      Patient Profile: [name] [name] is a 53 y.o. male with a hx of CAD (moderate nonobstructive LAD stenosis in 2016 cath; Myoview  in 2022 suggested mild inferior apical ischemic territory that was medically managed), DM2, HTN, HLD, depression, anxiety, GERD, chronic bronchitis, COPD diabetic neuropathy, PAD (s/p amputation of fourth, fifth toe, followed by [facility]) who is being seen 02/06/2024 for the evaluation of syncope at the request of Dr. Willette.  History of Present Illness: [name] [name] was last seen in heart care OV 11/14/2023 by Dr. Sierras for follow-up.  Doing well from cardiac standpoint without any complaints.  Continued on ASA 81 mg daily, Lipitor  80 mg daily, Zetia  10 mg daily, NTG as needed, midodrine  10 mg 3 times daily.   Presented to [facility] ED 9/6 with complaints of right shoulder pain after having a syncopal episode. EKG: NSR, HR 96 with RBBB and LAFB with no acute ischemic changes. Abnormal labs include CR 2 now 1.65, albumin  3, CBC with Hgb 12.4 now 10.3, HCT 37 now 31.5, RBC 3.95 now 3.37.  Normal labs including K 4.7 now 4.5, TN 18 >17, lactic acid 1.4, D-dimer 0.32 CT head/cervical spine with chronic small vessel disease and no acute findings.  Shoulder x-ray with minimally impacted fracture of the right humeral surgical neck. Wrist/elbow right x-ray was negative.  Pulmonary perfusion test negative for PE. CXR negative for active disease. ECHO 02/05/2024 with EF 55 to 60%, severe asymmetrical LVH, G1 DD, elevated LVEDP US  carotid 02/05/2024 showed moderate 50 to 69% stenosis in right carotid artery and mild less than 50% stenosis in left carotid artery.  On interview, patient reports unwitnessed episode of syncope on Saturday morning after  getting out of bed and walking to the bathroom prior to episode patient felt lightheaded, flushed, vomited and SOB.  Suspects being unconscious for maybe 2 minutes.  After regaining consciousness, patient reported metallic taste in mouth, incontinence and confusion.  Patient reports a history of syncope for 15 years that has previously been related to orthostasis, blood sugar and unknown cause.  Noted last month he passed out twice with similar episodes as above.  Endorses adequate daily hydration.  Reports medication compliance.  Denies any medication changes.  Denies any history of seizures. Reports family history of unknown sudden death. Smokes 1 pack/day, denies EtOH or drugs.     Past Medical History:  Diagnosis Date   Anxiety    Arthritis    Asthma    CAD (coronary artery disease)    a. 03/2015 Cath: LM nl, LAD mild diff dzs throughout w/ 50p/d, LCX diff dzs, 107m, RCA diff dzs, 37m-->Med rx; b. 12/2020 MV: EF 57%, small/mild apical inferior defect w/ partial rev/mild ischemia.   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, bilateral    Sleep apnea 2016   does not use  CPAP   Type 2 diabetes mellitus (HCC)     Past Surgical History:  Procedure Laterality Date   ABDOMINAL AORTOGRAM W/LOWER EXTREMITY N/A 06/20/2023   Procedure: ABDOMINAL AORTOGRAM W/LOWER EXTREMITY;  Surgeon: Pearline Norman RAMAN, MD;  Location: [facility] INVASIVE CV LAB;  Service: Cardiovascular;  Laterality: N/A;   AMPUTATION Left 06/24/2023   Procedure: LEFT 5TH TOE AMPUTATION;  Surgeon: Harden Jerona GAILS, MD;  Location: [facility] OR;  Service: Orthopedics;  Laterality: Left;   AMPUTATION Left  07/20/2023   Procedure: LEFT FOOT 4TH AND 5TH RAY AMPUTATION;  Surgeon: Harden Jerona GAILS, MD;  Location: [facility] OR;  Service: Orthopedics;  Laterality: Left;   ANKLE SURGERY Right 1982   had extra bones in there; took them out   APPENDECTOMY  1975   BIOPSY  12/26/2018   Procedure: BIOPSY;  Surgeon: Golda Claudis PENNER, MD;  Location: [facility] ENDO SUITE;  Service: Endoscopy;;  duodenal biopsies   BIOPSY  03/23/2019   Procedure: BIOPSY;  Surgeon: Golda Claudis PENNER, MD;  Location: [facility] END[facility] SUITE;  Service: Endoscopy;;  esophagus   CARDIAC CATHETERIZATION N/A 03/28/2015   Procedure: Left Heart Cath and Coronary Angiography;  Surgeon: Gordy Bergamo, MD;  Location: [facility] INVASIVE CV LAB;  Service: Cardiovascular;  Laterality: N/A;   CARDIAC CATHETERIZATION N/A 03/28/2015   Procedure: Intravascular Pressure Wire/FFR Study;  Surgeon: Gordy Bergamo, MD;  Location: [facility] INVASIVE CV LAB;  Service: Cardiovascular;  Laterality: N/A;   CARPAL TUNNEL RELEASE Left ~ 2008   COLONOSCOPY WITH ESOPHAGOGASTRODUODENOSCOPY (EGD)     COLONOSCOPY WITH PROPOFOL  N/A 12/24/2019   Procedure: COLONOSCOPY WITH PROPOFOL ;  Surgeon: Golda Claudis PENNER, MD;  Location: [facility];  Service: Endoscopy;  Laterality: N/A;  955   ELBOW FRACTURE SURGERY Left ~ 2008   ESOPHAGEAL MANOMETRY N/A 09/16/2021   Procedure: ESOPHAGEAL MANOMETRY (EM);  Surgeon: San Sandor GAILS, DO;  Location: [facility] ENDOSCOPY;  Service: Endoscopy;  Laterality: N/A;   ESOPHAGOGASTRODUODENOSCOPY N/A 12/26/2018   Procedure: ESOPHAGOGASTRODUODENOSCOPY (EGD);  Surgeon: Golda Claudis PENNER, MD;  Location: [facility];  Service: Endoscopy;  Laterality: N/A;   ESOPHAGOGASTRODUODENOSCOPY (EGD) WITH PROPOFOL  N/A 03/23/2019   Procedure: ESOPHAGOGASTRODUODENOSCOPY (EGD) WITH PROPOFOL ;  Surgeon: Golda Claudis PENNER, MD;  Location: [facility];  Service: Endoscopy;  Laterality: N/A;  7:30   FRACTURE SURGERY     ankle and elbow   PERIPHERAL VASCULAR BALLOON ANGIOPLASTY  06/20/2023   Procedure: PERIPHERAL  VASCULAR BALLOON ANGIOPLASTY;  Surgeon: Pearline Norman RAMAN, MD;  Location: [facility] INVASIVE CV LAB;  Service: Cardiovascular;;   PH IMPEDANCE STUDY N/A 09/16/2021   Procedure: PH IMPEDANCE STUDY;  Surgeon: San Sandor GAILS, DO;  Location: [facility] ENDOSCOPY;  Service: Endoscopy;  Laterality: N/A;   PILONIDAL CYST EXCISION N/A 03/24/2017   Procedure: EXCISION CHRONIC  PILONIDAL ABSCESS;  Surgeon: Vernetta Berg, MD;  Location: [facility] ORS;  Service: General;  Laterality: N/A;   SCROTAL EXPLORATION N/A 11/13/2020   Procedure: EXCISION OF SEBACEOUS CYSTS, SCROTUM;  Surgeon: Sherrilee Belvie CROME, MD;  Location: [facility] ORS;  Service: Urology;  Laterality: N/A;   TENDON REPAIR Left ~ 2004   main tendon in my ankle     Home Medications:  Prior to Admission medications   Medication Sig Start Date End Date Taking? Authorizing Provider  ARIPiprazole  (ABILIFY ) 10 MG tablet Take 10 mg by mouth in the morning.   Yes [provider]  aspirin  EC 81 MG tablet Take 81 mg by mouth daily with breakfast. 02/14/21  Yes [provider]  atorvastatin  (  LIPITOR ) 80 MG tablet Take 1 tablet (80 mg total) by mouth at bedtime. 12/01/23  Yes Debera Jayson MATSU, MD  clopidogrel  (PLAVIX ) 75 MG tablet Take 1 tablet (75 mg total) by mouth daily. 06/20/23 06/19/24 Yes Pearline Norman RAMAN, MD  ezetimibe  (ZETIA ) 10 MG tablet Take 1 tablet (10 mg total) by mouth daily. 12/01/23 02/29/24 Yes Debera Jayson MATSU, MD  famotidine  (PEPCID ) 40 MG tablet Take 1 tablet (40 mg total) by mouth 2 (two) times daily. 05/26/21  Yes Comer Kirsch, PA-C  insulin  glargine (LANTUS  SOLOSTAR) 100 UNIT/ML Solostar Pen Inject 12 Units into the skin daily. 10/27/23  Yes Shamleffer, Ibtehal Jaralla, MD  sertraline  (ZOLOFT ) 100 MG tablet Take 2 tablets (200 mg total) by mouth at bedtime. 08/30/22  Yes Christobal Guadalajara, MD    Scheduled Meds:  amLODipine   10 mg Oral Daily   ARIPiprazole   10 mg Oral q AM   aspirin  EC  81 mg Oral Q breakfast   atorvastatin   80 mg Oral QHS    clopidogrel   75 mg Oral Daily   doxycycline   100 mg Oral BID   famotidine   40 mg Oral QHS   gabapentin   300 mg Oral TID   heparin   5,000 Units Subcutaneous Q8H   insulin  aspart  0-15 Units Subcutaneous TID WC   insulin  aspart  0-5 Units Subcutaneous QHS   insulin  glargine-yfgn  12 Units Subcutaneous QHS   sertraline   200 mg Oral QHS   sodium chloride  flush  3 mL Intravenous Q12H   sodium chloride  flush  3 mL Intravenous Q12H   Continuous Infusions:  PRN Meds: acetaminophen  **OR** acetaminophen , hydrALAZINE , HYDROmorphone  (DILAUDID ) injection, ipratropium, levalbuterol , ondansetron  **OR** ondansetron  (ZOFRAN ) IV, oxyCODONE , senna-docusate, sodium phosphate , zolpidem   Allergies:    Allergies  Allergen Reactions   Bee Venom Swelling   Morphine  Itching and Nausea And Vomiting    IV site   Nexium [Esomeprazole] Swelling    Face swells, no breathing impairment   Prilosec Otc [Omeprazole Magnesium ] Swelling    Face swells, no breathing impairment    Social History:   Social History   Socioeconomic History   Marital status: Single    Spouse name: Not on file   Number of children: 0   Years of education: 10th grade   Highest education level: Not on file  Occupational History   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  Other Topics Concern   Not on file  Social History Narrative   His main caretaker, his mother has terminal metastatic cancer.   Family History:   Family History   Problem Relation Age of Onset   Diabetes Mother    Hypertension Mother    Cancer Mother    Throat cancer Mother    Diabetes Father    Hypertension Father    Hyperlipidemia Father    Congestive Heart Failure Sister    Colon cancer Neg  Hx    Pancreatic cancer Neg Hx    Liver disease Neg Hx      ROS:  Please see the history of present illness.  All other ROS reviewed and negative.     Physical Exam/Data: Vitals:   02/05/24 0500 02/05/24 0558 02/05/24 2041 02/06/24 0453  BP:  (!) 143/77 (!) 157/82 (!) 192/93  Pulse:  81 85 93  Resp:   20 16  Temp:  98.5 F (36.9 C) 99 F (37.2 C) 98.4 F (36.9 C)  TempSrc:  Oral Oral Oral  SpO2:  96% 96% 94%  Weight: 65.5 kg   65.6 kg  Height:        Intake/Output Summary (Last 24 hours) at 02/06/2024 0906 Last data filed at 02/05/2024 2144 Gross per 24 hour  Intake 480 ml  Output 400 ml  Net 80 ml      02/06/2024    4:53 AM 02/05/2024    5:00 AM 02/04/2024    4:00 PM  Last 3 Weights  Weight (lbs) 144 lb 11.2 oz 144 lb 6.4 oz 140 lb 11.2 oz  Weight (kg) 65.635 kg 65.5 kg 63.821 kg     Body mass index is 22 kg/m.  General:  Well nourished, well developed, in no acute distress HEENT: normal Neck: no JVD Vascular: No carotid bruits; Distal pulses 2+ bilaterally Cardiac:  normal S1, S2; RRR; no murmur  Lungs:  clear to auscultation bilaterally, no wheezing, rhonchi or rales  Abd: soft, nontender, no hepatomegaly  Ext: no edema Musculoskeletal:  No deformities, BUE and BLE strength normal and equal Skin: warm and dry  Neuro:  CNs 2-12 intact, no focal abnormalities noted Psych:  Normal affect   EKG:  The EKG was personally reviewed and demonstrates:  NSR, HR 96 with RBBB and LAFB with no acute ischemic changes. Telemetry:  Telemetry was personally reviewed and demonstrates:  NSR w/ PVC & PAC, HT 70-90's  Relevant CV Studies:  ECHO IMPRESSIONS 02/05/2024  1. Left ventricular ejection fraction, by estimation, is 55 to 60%. The  left  ventricle has normal function. Left ventricular endocardial border  not optimally defined to evaluate regional wall motion. There is severe  asymmetric left ventricular  hypertrophy of the lateral segment. Left ventricular diastolic parameters  are consistent with Grade I diastolic dysfunction (impaired relaxation).  Elevated left ventricular end-diastolic pressure.   2. Right ventricular systolic function was not well visualized. The right  ventricular size is normal. Tricuspid regurgitation signal is inadequate  for assessing PA pressure.   3. The mitral valve is grossly normal. No evidence of mitral valve  regurgitation. No evidence of mitral stenosis.   4. The aortic valve was not well visualized. Aortic valve regurgitation  is not visualized. No aortic stenosis is present.   Comparison(s): No significant change from prior study.   US  carotid 02/05/2024 IMPRESSION: 1. Right carotid artery system: 50-69% stenosis secondary to moderate multifocal atherosclerotic plaque formation most prominent about the carotid bulb and bifurcation.   2. Left carotid artery system: Less than 50% stenosis secondary to mild multifocal atherosclerotic plaque formation.   3.  Vertebral artery system: Patent with antegrade flow bilaterally.  Lexiscan  12/2020 Narrative & Impression  No diagnostic ST segment changes to indicate ischemia. Small, mild intensity, apical inferior defect that is partially reversible and consistent with a mild ischemic territory. This is a low risk study. Nuclear stress EF: 57%.   Cath 2016 1. Normal LV systolic function, EF 55-60%. 2. Moderate coronary  artery disease, proximal to midsegment of the LAD showing a diffuse 50% stenosis, there is rapid tapering of the LAD from the proximal segment to the midsegment and followed by a hazy 50% stenosis. Mid to distal LAD also had a 50% stenosis. FFR to this region was negative for hemodynamic significance. There is minimal disease in  the circumflex and RCA. Codominant system.   Laboratory Data: High Sensitivity Troponin:   Recent Labs  Lab 02/04/24 1008 02/04/24 1116  TROPONINIHS 18* 17     Chemistry Recent Labs  Lab 02/04/24 1008 02/05/24 0424 02/06/24 0501  NA 135 133* 137  K 4.7 4.4 4.5  CL 99 107 112*  CO2 23 21* 22  GLUCOSE 274* 112* 142*  BUN 32* 31* 25*  CREATININE 2.00* 1.80* 1.65*  CALCIUM  8.4* 7.6* 7.9*  GFRNONAA 39* 44* 49*  ANIONGAP 13 5 3*    Recent Labs  Lab 02/04/24 1008  PROT 6.2*  ALBUMIN  3.0*  AST 15  ALT 14  ALKPHOS 97  BILITOT 1.5*   Lipids No results for input(s): CHOL, TRIG, HDL, LABVLDL, LDLCALC, CHOLHDL in the last 168 hours.  Hematology Recent Labs  Lab 02/04/24 1008 02/05/24 0424 02/06/24 0501  WBC 19.7* 9.4 9.2  RBC 3.95* 3.28* 3.37*  HGB 12.4* 10.1* 10.3*  HCT 37.0* 31.0* 31.5*  MCV 93.7 94.5 93.5  MCH 31.4 30.8 30.6  MCHC 33.5 32.6 32.7  RDW 12.9 13.0 12.9  PLT 232 174 178   Thyroid  No results for input(s): TSH, FREET4 in the last 168 hours.  BNPNo results for input(s): BNP, PROBNP in the last 168 hours.  DDimer No results for input(s): DDIMER in the last 168 hours.  Radiology/Studies:  US  Carotid Bilateral Result Date: 02/05/2024 CLINICAL DATA:  53 year old male with history of syncope and collapse. EXAM: BILATERAL CAROTID DUPLEX ULTRASOUND TECHNIQUE: Elnor scale imaging, color Doppler and duplex ultrasound were performed of bilateral carotid and vertebral arteries in the neck. COMPARISON:  None Available. FINDINGS: Criteria: Quantification of carotid stenosis is based on velocity parameters that correlate the residual internal carotid diameter with NASCET-based stenosis levels, using the diameter of the distal internal carotid lumen as the denominator for stenosis measurement. The following velocity measurements were obtained: RIGHT ICA: Peak systolic velocity 157 cm/sec, End diastolic velocity 40 cm/sec CCA: Peak systolic velocity 97  cm/sec SYSTOLIC ICA/CCA RATIO:  1.6 ECA: Peak systolic velocity 154 cm/sec LEFT ICA: Peak systolic velocity 128 cm/sec, End diastolic velocity 27 cm/sec CCA: 98 cm/sec SYSTOLIC ICA/CCA RATIO:  1.3 ECA: 109 cm/sec RIGHT CAROTID ARTERY: Moderate multifocal atherosclerotic plaque formation, most prominent about the carotid bulb and bifurcation. No significant tortuosity. Normal low resistance waveforms. RIGHT VERTEBRAL ARTERY:  Antegrade flow. LEFT CAROTID ARTERY: Mild multifocal atherosclerotic plaque formation, most prominent about the carotid bulb and bifurcation. No significant tortuosity. Normal low resistance waveforms. LEFT VERTEBRAL ARTERY:  Antegrade flow. Upper extremity non-invasive blood pressures: Not obtained. IMPRESSION: 1. Right carotid artery system: 50-69% stenosis secondary to moderate multifocal atherosclerotic plaque formation most prominent about the carotid bulb and bifurcation. 2. Left carotid artery system: Less than 50% stenosis secondary to mild multifocal atherosclerotic plaque formation. 3.  Vertebral artery system: Patent with antegrade flow bilaterally. Ester Sides, MD [facility] Radiology Electronically Signed   By: Ester Sides M.D.   On: 02/05/2024 14:39   DG CHEST PORT 1 VIEW Result Date: 02/04/2024 CLINICAL DATA:  Shortness of breath EXAM: PORTABLE CHEST 1 VIEW COMPARISON:  08/27/2022 FINDINGS: The heart  size and mediastinal contours are within normal limits. Both lungs are clear. The visualized skeletal structures are unremarkable. IMPRESSION: No active disease. Electronically Signed   By: Franky Crease M.D.   On: 02/04/2024 18:31   NM Pulmonary Perfusion Result Date: 02/04/2024 CLINICAL DATA:  Syncope/presyncope, cerebrovascular cause suspected EXAM: NUCLEAR MEDICINE PERFUSION LUNG SCAN TECHNIQUE: Perfusion images were obtained in multiple projections after intravenous injection of radiopharmaceutical. Ventilation scans  intentionally deferred if perfusion scan and chest x-ray adequate for interpretation during COVID 19 epidemic. RADIOPHARMACEUTICALS:  4.4 mCi Tc-31m MAA IV COMPARISON:  None Available. FINDINGS: No perfusion defects to suggest pulmonary embolus. IMPRESSION: No evidence of pulmonary embolus. Electronically Signed   By: Franky Crease M.D.   On: 02/04/2024 17:21   CT Head Wo Contrast Result Date: 02/04/2024 CLINICAL DATA:  Syncope/presyncope, cerebrovascular cause suspected; Ataxia, cervical trauma. Fall. Right shoulder pain. EXAM: CT HEAD WITHOUT CONTRAST CT CERVICAL SPINE WITHOUT CONTRAST TECHNIQUE: Multidetector CT imaging of the head and cervical spine was performed following the standard protocol without intravenous contrast. Multiplanar CT image reconstructions of the cervical spine were also generated. RADIATION DOSE REDUCTION: This exam was performed according to the departmental dose-optimization program which includes automated exposure control, adjustment of the mA and/or kV according to patient size and/or use of iterative reconstruction technique. COMPARISON:  Head CT 02/08/2023 and MRI 09/06/2023 FINDINGS: CT HEAD FINDINGS Brain: There is no evidence of an acute infarct, intracranial hemorrhage, mass, midline shift, or extra-axial fluid collection. There is mild cerebral atrophy. The ventricles are normal in size. Cerebral white matter hypodensities are nonspecific but compatible with mild chronic small vessel ischemic disease. Vascular: Calcified atherosclerosis at the skull base. No hyperdense vessel. Skull: No fracture or suspicious lesion. Sinuses/Orbits: Visualized paranasal sinuses and mastoid air cells are clear. Unremarkable orbits. Other: None. CT CERVICAL SPINE FINDINGS Alignment: Reversal of the normal cervical lordosis. No significant listhesis. Skull base and vertebrae: No acute fracture or suspicious lesion. Soft tissues and spinal canal: No prevertebral fluid or swelling. No visible canal  hematoma. Disc levels: Focally advanced disc degeneration at C5-6 and C6-7 with severe disc space narrowing, degenerative endplate sclerosis, and spurring. Mild spinal stenosis at both levels. Asymmetrically severe left neural foraminal stenosis at C6-7. Upper chest: Clear lung apices. Other: Mild atherosclerotic calcification at the right greater than left carotid bifurcations. IMPRESSION: 1. No evidence of acute intracranial abnormality or cervical spine fracture. 2. Mild chronic small vessel ischemic disease. Electronically Signed   By: Dasie Hamburg M.D.   On: 02/04/2024 11:07   CT Cervical Spine Wo Contrast Result Date: 02/04/2024 CLINICAL DATA:  Syncope/presyncope, cerebrovascular cause suspected; Ataxia, cervical trauma. Fall. Right shoulder pain. EXAM: CT HEAD WITHOUT CONTRAST CT CERVICAL SPINE WITHOUT CONTRAST TECHNIQUE: Multidetector CT imaging of the head and cervical spine was performed following the standard protocol without intravenous contrast. Multiplanar CT image reconstructions of the cervical spine were also generated. RADIATION DOSE REDUCTION: This exam was performed according to the departmental dose-optimization program which includes automated exposure control, adjustment of the mA and/or kV according to patient size and/or use of iterative reconstruction technique. COMPARISON:  Head CT 02/08/2023 and MRI 09/06/2023 FINDINGS: CT HEAD FINDINGS Brain: There is no evidence of an acute infarct, intracranial hemorrhage, mass, midline shift, or extra-axial fluid collection. There is mild cerebral atrophy. The ventricles are normal in size. Cerebral white matter hypodensities are nonspecific but compatible with mild chronic small vessel ischemic disease. Vascular: Calcified atherosclerosis at the skull base. No hyperdense vessel. Skull: No fracture  or suspicious lesion. Sinuses/Orbits: Visualized paranasal sinuses and mastoid air cells are clear. Unremarkable orbits. Other: None. CT CERVICAL SPINE  FINDINGS Alignment: Reversal of the normal cervical lordosis. No significant listhesis. Skull base and vertebrae: No acute fracture or suspicious lesion. Soft tissues and spinal canal: No prevertebral fluid or swelling. No visible canal hematoma. Disc levels: Focally advanced disc degeneration at C5-6 and C6-7 with severe disc space narrowing, degenerative endplate sclerosis, and spurring. Mild spinal stenosis at both levels. Asymmetrically severe left neural foraminal stenosis at C6-7. Upper chest: Clear lung apices. Other: Mild atherosclerotic calcification at the right greater than left carotid bifurcations. IMPRESSION: 1. No evidence of acute intracranial abnormality or cervical spine fracture. 2. Mild chronic small vessel ischemic disease. Electronically Signed   By: Dasie Hamburg M.D.   On: 02/04/2024 11:07   DG Wrist Complete Right Result Date: 02/04/2024 CLINICAL DATA:  Right wrist pain after fall. EXAM: RIGHT WRIST - COMPLETE 3+ VIEW COMPARISON:  None Available. FINDINGS: There is no evidence of fracture or dislocation. There is no evidence of arthropathy or other focal bone abnormality. Soft tissues are unremarkable. IMPRESSION: Negative. Electronically Signed   By: Lynwood Landy Raddle M.D.   On: 02/04/2024 10:20   DG Elbow Complete Right Result Date: 02/04/2024 CLINICAL DATA:  Right elbow pain after fall. EXAM: RIGHT ELBOW - COMPLETE 3+ VIEW COMPARISON:  None Available. FINDINGS: There is no evidence of fracture, dislocation, or joint effusion. There is no evidence of arthropathy or other focal bone abnormality. Soft tissues are unremarkable. IMPRESSION: Negative. Electronically Signed   By: Lynwood Landy Raddle M.D.   On: 02/04/2024 10:17   DG Shoulder Right Result Date: 02/04/2024 CLINICAL DATA:  Status post fall with right shoulder pain EXAM: RIGHT SHOULDER - 3 VIEW COMPARISON:  None Available. FINDINGS: Minimally impacted fracture of the right humeral surgical neck. Small displaced fracture fragment  projects along the medial aspect of the neck. No acute dislocation of the glenohumeral joint. Diffuse soft tissue swelling surrounding the right shoulder. IMPRESSION: Minimally impacted fracture of the right humeral surgical neck. Electronically Signed   By: Limin  Xu M.D.   On: 02/04/2024 10:16     Assessment and Plan:  Syncope Carotid Stenosis  Lexiscan  12/2020 showed low risk study with small mild intensity apical partially reversible inferior defect C/W mild ischemic territory and EF 57% EKG: NSR, HR 96 with RBBB and LAFB with no acute ischemic changes. Tele: NSR w/ PVC & PAC, HT 70-90's Negative VQ scan and D-dimer WNL.  ECHO 02/05/2024 with EF 55 to 60%, severe asymmetrical LVH, G1DD, elevated LVEDP US  carotid 02/05/2024 showed moderate 50 to 69% stenosis in right carotid artery and mild less than 50% stenosis in left carotid artery.  No bruits on exam.  Less likely contributing factor. No acute findings on CT head.  Based on presentation, could be secondary to vasovagal, orthostatic hypotension, or polypharmacy. Less concern for cardiac cause, however would recommend live ZIO 2-week monitor.  Since episodes occur so infrequently may need to consider loop recorder if no findings on ZIO.  Will discuss with MD.  HTN Orthostatic hypotension Discontinued midodrine  due to elevated BP. BP severely elevated 192/93, however this was prior to medications being given.  Will message nurse to recheck BP. Continue on amlodipine  10 mg If BP remains elevated would consider adding ACE inhibitor or ARB based on history of CAD.  Would consider adding lisinopril 5 mg daily.  Will discuss with MD  CAD HLD ECHO 02/05/2024 with  EF 55 to 60%, severe asymmetrical LVH, G1 DD, elevated LVEDP In the setting of EKG without ischemia, no chest pain and TN 18 >17, no need for ischemic evaluation at this time Order home Zetia  10 mg daily.  Continue on ASA 81 mg daily, Lipitor  80 mg daily, NTG as  needed   PAD Continue on ASA 81 mg, Plavix  75 mg daily, Lipitor  80 mg daily    For questions or updates, please contact [facility] Please consult [email or web address] for contact info under    Signed, Lorette CINDERELLA Kapur, PA-C  02/06/2024 9:06 AM

## 2024-02-06 NOTE — Plan of Care (Signed)
  Problem: Acute Rehab OT Goals (only OT should resolve) Goal: Pt. Will Transfer To Toilet Flowsheets (Taken 02/06/2024 1109) Pt Will Transfer to Toilet:  with modified independence  ambulating Goal: Pt/Caregiver Will Perform Home Exercise Program Flowsheets (Taken 02/06/2024 1109) Pt/caregiver will Perform Home Exercise Program:  Increased strength  Left upper extremity  Independently  Gaelle Adriance OT, MOT

## 2024-02-06 NOTE — TOC Transition Note (Signed)
 Transition of Care [facility]) - Discharge Note   Patient Details  Name: [name] [name] MRN: [id number] Date of Birth: Aug 10, 1970  Transition of [facility] CM/SW Contact:  Sharlyne Stabs, RN Phone Number: 02/06/2024, 10:54 AM   Clinical Narrative:   Patient discharging home. He walked 30 feet with PT.  He insurance will not cover home health, patient drives himself to appointments. CM at the bedside to offer outpatient PT. He denied at this time. He states he uses a cane, drives to get groceries for he and his father. He goes to [facility] for several appointment. He has follow up appointment on AVS. He is ready to discharge home.    Final next level of care: Home/Self Care Barriers to Discharge: Barriers Resolved  Patient Goals and CMS Choice Patient states their goals for this hospitalization and ongoing recovery are:: to get better CMS Medicare.gov Compare Post Acute Care list provided to:: Patient Choice offered to / list presented to : Patient [place] ownership interest in [facility].provided to:: Patient    Discharge Placement          Patient and family notified of of transfer: 02/06/24  Discharge Plan and Services Additional resources added to the After Visit Summary for        Social Drivers of Health (SDOH) Interventions SDOH Screenings   Food Insecurity: No Food Insecurity (02/04/2024)  Housing: Low Risk  (02/04/2024)  Transportation Needs: No Transportation Needs (02/04/2024)  Utilities: Not At Risk (02/04/2024)  Depression (PHQ2-9): High Risk (01/17/2024)  Financial Resource Strain: High Risk (06/24/2022)  Social Connections: Unknown (05/25/2022)   Received from [facility]  Tobacco Use: High Risk (02/04/2024)     Readmission Risk Interventions    02/05/2024    4:21 PM  Readmission Risk Prevention Plan  Transportation Screening Complete  PCP or Specialist Appt within 5-7 Days Complete  Home Care Screening Complete  Medication Review (RN CM) Complete

## 2024-02-06 NOTE — Plan of Care (Signed)
   Problem: Education: Goal: Knowledge of General Education information will improve Description Including pain rating scale, medication(s)/side effects and non-pharmacologic comfort measures Outcome: Progressing   Problem: Health Behavior/Discharge Planning: Goal: Ability to manage health-related needs will improve Outcome: Progressing

## 2024-02-08 ENCOUNTER — Encounter: Payer: Self-pay | Admitting: Family

## 2024-02-08 ENCOUNTER — Ambulatory Visit (INDEPENDENT_AMBULATORY_CARE_PROVIDER_SITE_OTHER): Payer: MEDICAID | Admitting: Family

## 2024-02-08 DIAGNOSIS — S42211A Unspecified displaced fracture of surgical neck of right humerus, initial encounter for closed fracture: Secondary | ICD-10-CM

## 2024-02-08 MED ORDER — HYDROCODONE-ACETAMINOPHEN 5-325 MG PO TABS: 1.0000 | ORAL_TABLET | Freq: Four times a day (QID) | ORAL | 0 refills | Status: DC | PRN

## 2024-02-08 NOTE — Progress Notes (Signed)
 Office Visit Note   Patient: [name] [name]           Date of Birth: 08-24-1970           MRN: [id number] Visit Date: 02/08/2024              Requested by: [name], PA-C [address] [place],  [place] [address] PCP: [name], PA-C  Chief Complaint  Patient presents with   Right Elbow - Fracture      HPI: The patient is a 53 year old gentleman who is seen today in evaluation for right humeral neck fracture  He initially passed out and collapsed with direct impact on his right flank he is right-hand dominant.  Radiographs were obtained and the [facility] on 02/04/24.  Was placed in a sling.  He had difficulty with his sling and discontinued use  Assessment & Plan: Visit Diagnoses: No diagnosis found.  Plan: He will resume his wear of the sling provided a new one today.  He will remove this several times a day to work on pendulum swings  Follow-Up Instructions: No follow-ups on file.   Right Hand Exam   Muscle Strength  Grip: 5/5   Other  Pulse: present   Right Shoulder Exam   Other  Sensation: normal Pulse: present      Patient is alert, oriented, no adenopathy, well-dressed, normal affect, normal respiratory effort. Ecchymosis throughout the upper arm and forearm    Imaging: No results found. No images are attached to the encounter.  Labs: Lab Results  Component Value Date   HGBA1C 8.6 (H) 02/04/2024   HGBA1C 7.3 (A) 10/27/2023   HGBA1C 8.7 (A) 07/12/2023   ESRSEDRATE 20 (H) 06/18/2022   ESRSEDRATE 1 08/20/2015   CRP <0.5 06/18/2022   CRP 2.5 01/23/2019   CRP <0.5 08/20/2015   REPTSTATUS 06/18/2023 FINAL 06/13/2023   CULT  06/13/2023    NO GROWTH 5 DAYS Performed at [facility], [address], [place], [place] [address]      Lab Results  Component Value Date   ALBUMIN  3.0 (L) 02/04/2024   ALBUMIN  3.2 (L) 08/27/2022   ALBUMIN  2.8 (L) 07/26/2022    Lab Results  Component Value Date   MG 1.8 02/08/2023   MG 2.0  08/29/2022   MG 1.5 (L) 08/28/2022   No results found for: VD25OH  No results found for: PREALBUMIN    Latest Ref Rng & Units 02/06/2024    5:01 AM 02/05/2024    4:24 AM 02/04/2024   10:08 AM  CBC EXTENDED  WBC 4.0 - 10.5 K/uL 9.2  9.4  19.7   RBC 4.22 - 5.81 MIL/uL 3.37  3.28  3.95   Hemoglobin 13.0 - 17.0 g/dL 89.6  89.8  87.5   HCT 39.0 - 52.0 % 31.5  31.0  37.0   Platelets 150 - 400 K/uL 178  174  232      There is no height or weight on file to calculate BMI.  Orders:  No orders of the defined types were placed in this encounter.  No orders of the defined types were placed in this encounter.    Procedures: No procedures performed  Clinical Data: No additional findings.  ROS:  All other systems negative, except as noted in the HPI. Review of Systems  Objective: Vital Signs: There were no vitals taken for this visit.  Specialty Comments:  No specialty comments available.  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) 07/12/2023   Gangrene of toe of left foot (HCC) 06/24/2023   Diabetes mellitus (HCC) 04/11/2023   PTSD (post-traumatic stress disorder) 03/14/2023   Aortic valve sclerosis 09/16/2022   COPD GOLD 1/ AB 06/11/2022   Syncope and collapse 12/13/2021   Bifascicular block 12/13/2021   Renal insufficiency 12/13/2021   Esophageal motility disorder 12/13/2021   Scrotal abscess 11/19/2020   Barrett's esophagus 01/23/2019   MDD (major depressive disorder), severe (HCC) 12/17/2018   Uncontrolled type 2 diabetes mellitus with hyperglycemia (HCC) 03/22/2018   ARF (acute renal failure) (HCC) 05/30/2017   Mixed hyperlipidemia 03/31/2017   Diabetic polyneuropathy (HCC) 03/31/2017   Vitamin B12 deficiency 03/31/2017   Vitamin D  deficiency 03/31/2017   Right bundle branch block 02/14/2017    Gastroesophageal reflux disease 10/15/2016   Benign hypertension 10/15/2016   Leukocytosis 11/25/2015   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) 05/02/2015   Coronary arteriosclerosis 03/28/2015   Anxiety disorder 02/27/2015   Current smoker 02/27/2015   Obesity 02/27/2015   Obstructive sleep apnea 02/27/2015   Past Medical History:  Diagnosis Date   Anxiety    Arthritis    Asthma    CAD (coronary artery disease)    a. 03/2015 Cath: LM nl, LAD mild diff dzs throughout w/ 50p/d, LCX diff dzs, 15m, RCA diff dzs, 37m-->Med rx; b. 12/2020 MV: EF 57%, small/mild apical inferior defect w/ partial rev/mild ischemia.   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, bilateral    Sleep apnea 2016   does not use CPAP   Type 2 diabetes mellitus (HCC)     Family History  Problem Relation Age of Onset   Diabetes Mother    Hypertension Mother    Cancer Mother    Throat cancer Mother    Diabetes Father    Hypertension Father    Hyperlipidemia Father    Congestive Heart Failure Sister    Colon cancer Neg Hx    Pancreatic cancer Neg Hx    Liver disease Neg Hx     Past Surgical History:  Procedure Laterality Date   ABDOMINAL AORTOGRAM W/LOWER EXTREMITY N/A 06/20/2023   Procedure: ABDOMINAL AORTOGRAM W/LOWER EXTREMITY;  Surgeon: Pearline Norman RAMAN, MD;  Location: [facility];  Service: Cardiovascular;  Laterality: N/A;   AMPUTATION Left 06/24/2023   Procedure: LEFT 5TH TOE AMPUTATION;  Surgeon: Harden Jerona GAILS, MD;  Location: [facility] OR;  Service: Orthopedics;  Laterality: Left;   AMPUTATION Left 07/20/2023    Procedure: LEFT FOOT 4TH AND 5TH RAY AMPUTATION;  Surgeon: Harden Jerona GAILS, MD;  Location: [facility] OR;  Service: Orthopedics;  Laterality: Left;   ANKLE SURGERY Right 1982   had extra bones in there; took them out   APPENDECTOMY  1975   BIOPSY  12/26/2018   Procedure: BIOPSY;  Surgeon: Golda Claudis PENNER, MD;  Location: [facility];  Service: Endoscopy;;  duodenal  biopsies   BIOPSY  03/23/2019   Procedure: BIOPSY;  Surgeon: Golda Claudis PENNER, MD;  Location: [facility];  Service: Endoscopy;;  esophagus   CARDIAC CATHETERIZATION N/A 03/28/2015   Procedure: Left Heart Cath and Coronary Angiography;  Surgeon: Gordy Bergamo, MD;  Location: [facility] INVASIVE CV LAB;  Service: Cardiovascular;  Laterality: N/A;   CARDIAC CATHETERIZATION N/A 03/28/2015   Procedure: Intravascular Pressure Wire/FFR Study;  Surgeon: Gordy Bergamo, MD;  Location: [facility] INVASIVE CV LAB;  Service: Cardiovascular;  Laterality: N/A;   CARPAL TUNNEL RELEASE Left ~ 2008   COLONOSCOPY WITH ESOPHAGOGASTRODUODENOSCOPY (EGD)     COLONOSCOPY WITH PROPOFOL  N/A 12/24/2019   Procedure: COLONOSCOPY WITH PROPOFOL ;  Surgeon: Golda Claudis PENNER, MD;  Location: [facility];  Service: Endoscopy;  Laterality: N/A;  955   ELBOW FRACTURE SURGERY Left ~ 2008   ESOPHAGEAL MANOMETRY N/A 09/16/2021   Procedure: ESOPHAGEAL MANOMETRY (EM);  Surgeon: San Sandor GAILS, DO;  Location: [facility];  Service: Endoscopy;  Laterality: N/A;   ESOPHAGOGASTRODUODENOSCOPY N/A 12/26/2018   Procedure: ESOPHAGOGASTRODUODENOSCOPY (EGD);  Surgeon: Golda Claudis PENNER, MD;  Location: [facility];  Service: Endoscopy;  Laterality: N/A;   ESOPHAGOGASTRODUODENOSCOPY (EGD) WITH PROPOFOL  N/A 03/23/2019   Procedure: ESOPHAGOGASTRODUODENOSCOPY (EGD) WITH PROPOFOL ;  Surgeon: Golda Claudis PENNER, MD;  Location: [facility];  Service: Endoscopy;  Laterality: N/A;  7:30   FRACTURE SURGERY     ankle and elbow   PERIPHERAL VASCULAR BALLOON ANGIOPLASTY  06/20/2023   Procedure: PERIPHERAL VASCULAR  BALLOON ANGIOPLASTY;  Surgeon: Pearline Norman RAMAN, MD;  Location: [facility];  Service: Cardiovascular;;   PH IMPEDANCE STUDY N/A 09/16/2021   Procedure: PH IMPEDANCE STUDY;  Surgeon: San Sandor GAILS, DO;  Location: [facility];  Service: Endoscopy;  Laterality: N/A;   PILONIDAL CYST EXCISION N/A 03/24/2017   Procedure: EXCISION CHRONIC  PILONIDAL ABSCESS;  Surgeon: Vernetta Berg, MD;  Location: [facility] ORS;  Service: General;  Laterality: N/A;   SCROTAL EXPLORATION N/A 11/13/2020   Procedure: EXCISION OF SEBACEOUS CYSTS, SCROTUM;  Surgeon: Sherrilee Belvie CROME, MD;  Location: [facility];  Service: Urology;  Laterality: N/A;   TENDON REPAIR Left ~ 2004   main tendon in my ankle   Social History   Occupational History   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

## 2024-02-16 ENCOUNTER — Emergency Department (HOSPITAL_COMMUNITY): Payer: MEDICAID

## 2024-02-16 ENCOUNTER — Other Ambulatory Visit: Payer: Self-pay

## 2024-02-16 ENCOUNTER — Emergency Department (HOSPITAL_COMMUNITY)
Admission: EM | Admit: 2024-02-16 | Discharge: 2024-02-16 | Disposition: A | Discharge status: Home / Self Care | Payer: MEDICAID | Attending: Emergency Medicine | Admitting: Emergency Medicine

## 2024-02-16 ENCOUNTER — Encounter (HOSPITAL_COMMUNITY): Payer: Self-pay

## 2024-02-16 DIAGNOSIS — I2510 Atherosclerotic heart disease of native coronary artery without angina pectoris: Secondary | ICD-10-CM | POA: Diagnosis not present

## 2024-02-16 DIAGNOSIS — F17210 Nicotine dependence, cigarettes, uncomplicated: Secondary | ICD-10-CM | POA: Insufficient documentation

## 2024-02-16 DIAGNOSIS — Z7982 Long term (current) use of aspirin: Secondary | ICD-10-CM | POA: Diagnosis not present

## 2024-02-16 DIAGNOSIS — I10 Essential (primary) hypertension: Secondary | ICD-10-CM | POA: Insufficient documentation

## 2024-02-16 DIAGNOSIS — Z7902 Long term (current) use of antithrombotics/antiplatelets: Secondary | ICD-10-CM | POA: Insufficient documentation

## 2024-02-16 DIAGNOSIS — R42 Dizziness and giddiness: Secondary | ICD-10-CM | POA: Insufficient documentation

## 2024-02-16 DIAGNOSIS — Z794 Long term (current) use of insulin: Secondary | ICD-10-CM | POA: Diagnosis not present

## 2024-02-16 DIAGNOSIS — M25511 Pain in right shoulder: Secondary | ICD-10-CM | POA: Insufficient documentation

## 2024-02-16 DIAGNOSIS — J4489 Other specified chronic obstructive pulmonary disease: Secondary | ICD-10-CM | POA: Insufficient documentation

## 2024-02-16 DIAGNOSIS — Z79899 Other long term (current) drug therapy: Secondary | ICD-10-CM | POA: Insufficient documentation

## 2024-02-16 DIAGNOSIS — E1142 Type 2 diabetes mellitus with diabetic polyneuropathy: Secondary | ICD-10-CM | POA: Diagnosis not present

## 2024-02-16 LAB — COMPREHENSIVE METABOLIC PANEL WITH GFR
ALT: 10 U/L (ref 0–44)
AST: 11 U/L — ABNORMAL LOW (ref 15–41)
Albumin: 2.6 g/dL — ABNORMAL LOW (ref 3.5–5.0)
Alkaline Phosphatase: 88 U/L (ref 38–126)
Anion gap: 13 (ref 5–15)
BUN: 22 mg/dL — ABNORMAL HIGH (ref 6–20)
CO2: 23 mmol/L (ref 22–32)
Calcium: 8.2 mg/dL — ABNORMAL LOW (ref 8.9–10.3)
Chloride: 105 mmol/L (ref 98–111)
Creatinine, Ser: 1.62 mg/dL — ABNORMAL HIGH (ref 0.61–1.24)
GFR, Estimated: 50 mL/min — ABNORMAL LOW (ref >60)
Glucose, Bld: 140 mg/dL — ABNORMAL HIGH (ref 70–99)
Potassium: 4.9 mmol/L (ref 3.5–5.1)
Sodium: 141 mmol/L (ref 135–145)
Total Bilirubin: 1.2 mg/dL (ref 0.0–1.2)
Total Protein: 5.9 g/dL — ABNORMAL LOW (ref 6.5–8.1)

## 2024-02-16 LAB — LIPASE, BLOOD: Lipase: 21 U/L (ref 11–51)

## 2024-02-16 LAB — CBC WITH DIFFERENTIAL/PLATELET
Abs Immature Granulocytes: 0.04 K/uL (ref 0.00–0.07)
Basophils Absolute: 0.1 K/uL (ref 0.0–0.1)
Basophils Relative: 1 %
Eosinophils Absolute: 0.2 K/uL (ref 0.0–0.5)
Eosinophils Relative: 2 %
HCT: 33.1 % — ABNORMAL LOW (ref 39.0–52.0)
Hemoglobin: 11 g/dL — ABNORMAL LOW (ref 13.0–17.0)
Immature Granulocytes: 0 %
Lymphocytes Relative: 14 %
Lymphs Abs: 1.5 K/uL (ref 0.7–4.0)
MCH: 31.3 pg (ref 26.0–34.0)
MCHC: 33.2 g/dL (ref 30.0–36.0)
MCV: 94 fL (ref 80.0–100.0)
Monocytes Absolute: 0.5 K/uL (ref 0.1–1.0)
Monocytes Relative: 5 %
Neutro Abs: 8.7 K/uL — ABNORMAL HIGH (ref 1.7–7.7)
Neutrophils Relative %: 78 %
Platelets: 265 K/uL (ref 150–400)
RBC: 3.52 MIL/uL — ABNORMAL LOW (ref 4.22–5.81)
RDW: 13.4 % (ref 11.5–15.5)
WBC: 11 K/uL — ABNORMAL HIGH (ref 4.0–10.5)
nRBC: 0 % (ref 0.0–0.2)

## 2024-02-16 LAB — TROPONIN I (HIGH SENSITIVITY)
Troponin I (High Sensitivity): 15 ng/L (ref <18)
Troponin I (High Sensitivity): 16 ng/L (ref <18)

## 2024-02-16 MED ORDER — ONDANSETRON HCL 4 MG/2ML IJ SOLN
4.0000 mg | Freq: Once | INTRAMUSCULAR | Status: AC
  Administered 2024-02-16: 4 mg via INTRAVENOUS
  Filled 2024-02-16: qty 2

## 2024-02-16 MED ORDER — MIDODRINE HCL 5 MG PO TABS
10.0000 mg | ORAL_TABLET | Freq: Once | ORAL | Status: AC
  Administered 2024-02-16: 10 mg via ORAL
  Filled 2024-02-16: qty 2

## 2024-02-16 MED ORDER — KETOROLAC TROMETHAMINE 15 MG/ML IJ SOLN
15.0000 mg | Freq: Once | INTRAMUSCULAR | Status: AC
  Administered 2024-02-16: 15 mg via INTRAVENOUS
  Filled 2024-02-16: qty 1

## 2024-02-16 MED ORDER — HYDROMORPHONE HCL 1 MG/ML IJ SOLN
1.0000 mg | Freq: Once | INTRAMUSCULAR | Status: AC
  Administered 2024-02-16: 1 mg via INTRAVENOUS
  Filled 2024-02-16: qty 1

## 2024-02-16 MED ORDER — HYDROCODONE-ACETAMINOPHEN 5-325 MG PO TABS: 1.0000 | ORAL_TABLET | Freq: Four times a day (QID) | ORAL | 0 refills | Status: DC | PRN

## 2024-02-16 MED ORDER — SODIUM CHLORIDE 0.9 % IV BOLUS
1000.0000 mL | Freq: Once | INTRAVENOUS | Status: AC
  Administered 2024-02-16: 1000 mL via INTRAVENOUS

## 2024-02-16 NOTE — ED Provider Notes (Signed)
 [place] EMERGENCY DEPARTMENT AT [facility] Provider Note  CSN: [id number] Arrival date & time: 02/16/24 1435  Chief Complaint(s) Arm Pain  HPI [name] [name] is a 53 y.o. male with past medical history as below, significant for CAD, COPD, GERD, HLD, HTN, DM, orthostatic hypotension who presents to the ED with complaint of arm pain, weakness  Pt was admitted earlier this month w/ syncope and humerus fx, he was discharged in stable condition but reports progressive weakness since then, difficulty getting out of bed, deconditioning. Still is ambulatory and able to perform his ADL's, just reports he feels weak overall since hospitalized. Worsening pain to right shoulder  today, reports ran out of pain medicine today. He reports dizziness when he moves from lying to standing position, gets lightheaded. Known hx orthostatic hypotension, variable compliance w/ midodrine . Variable compliance with hydration. Denies repeat injuries, denies and cp, dib, abd pain, nausea, diarrhea, change to bowel/bladder fxn. No HA    Past Medical History Past Medical History:  Diagnosis Date   Anxiety    Arthritis    Asthma    CAD (coronary artery disease)    a. 03/2015 Cath: LM nl, LAD mild diff dzs throughout w/ 50p/d, LCX diff dzs, 26m, RCA diff dzs, 2m-->Med rx; b. 12/2020 MV: EF 57%, small/mild apical inferior defect w/ partial rev/mild ischemia.   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, bilateral    Sleep apnea 2016   does not use CPAP   Type 2 diabetes mellitus [facility])    Patient Active Problem List   Diagnosis Date Noted   Humeral surgical  neck fracture 02/04/2024   PVD (peripheral vascular disease) (HCC) 02/04/2024   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) 07/12/2023   Gangrene of toe of left foot (HCC) 06/24/2023   Diabetes mellitus (HCC) 04/11/2023   PTSD (post-traumatic stress disorder) 03/14/2023   Aortic valve sclerosis 09/16/2022   COPD GOLD 1/ AB 06/11/2022   Syncope and collapse 12/13/2021   Bifascicular block 12/13/2021   Renal insufficiency 12/13/2021   Esophageal motility disorder 12/13/2021   Scrotal abscess 11/19/2020   Barrett's esophagus 01/23/2019   MDD (major depressive disorder), severe (HCC) 12/17/2018   Uncontrolled type 2 diabetes mellitus with hyperglycemia (HCC) 03/22/2018   ARF (acute renal failure) (HCC) 05/30/2017   Mixed hyperlipidemia 03/31/2017   Diabetic polyneuropathy (HCC) 03/31/2017   Vitamin B12 deficiency 03/31/2017   Vitamin D  deficiency 03/31/2017   Right bundle branch block 02/14/2017   Gastroesophageal reflux disease 10/15/2016   Benign hypertension 10/15/2016   Leukocytosis 11/25/2015   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) 05/02/2015   Coronary arteriosclerosis 03/28/2015   Anxiety disorder 02/27/2015   Current smoker 02/27/2015   Obesity 02/27/2015   Obstructive sleep apnea 02/27/2015   Home Medication(s) Prior to Admission medications   Medication Sig Start Date End Date Taking? Authorizing Provider  acetaminophen  (TYLENOL ) 500 MG tablet Take 1,000 mg by mouth every 6 (six) hours as needed for mild pain (pain score 1-3).   Yes [provider]  amLODipine  (NORVASC ) 10 MG tablet Take 1 tablet (10 mg total) by mouth at bedtime. 02/06/24 03/07/24 Yes Shahmehdi, Seyed A, MD  ARIPiprazole  (ABILIFY ) 10 MG tablet Take 10 mg by mouth in the morning.   Yes [provider]  aspirin  EC 81 MG tablet Take 81 mg by mouth daily with breakfast. 02/14/21  Yes [provider]  atorvastatin  (LIPITOR ) 80 MG tablet Take 1 tablet  (80 mg total) by mouth at bedtime. 12/01/23  Yes Debera Jayson MATSU, MD  clopidogrel  (PLAVIX ) 75 MG tablet Take 1 tablet (75 mg total) by mouth daily. 06/20/23 06/19/24 Yes Pearline Norman RAMAN, MD  ezetimibe  (ZETIA ) 10 MG tablet Take 1 tablet (10 mg total) by mouth daily. 12/01/23 02/29/24 Yes Debera Jayson MATSU, MD  famotidine  (PEPCID ) 40 MG tablet Take 1 tablet (40 mg total) by mouth 2 (two) times daily. 05/26/21  Yes Comer Kirsch, PA-C  Insulin  Aspart FlexPen (NOVOLOG ) 100 UNIT/ML Inject 0-5 Units into the skin 3 (three) times daily as needed (high blood sugar). Sliding scale   Yes [provider]  insulin  glargine (LANTUS  SOLOSTAR) 100 UNIT/ML Solostar Pen Inject 12 Units into the skin daily. 10/27/23  Yes Shamleffer, Ibtehal Jaralla, MD  midodrine  (PROAMATINE ) 5 MG tablet Take 1 tablet (5 mg total) by mouth 3 (three) times daily with meals. 02/06/24 03/07/24 Yes Shahmehdi, Adriana LABOR, MD  sertraline  (ZOLOFT ) 100 MG tablet Take 2 tablets (200 mg total) by mouth at bedtime. 08/30/22  Yes Christobal Guadalajara, MD  HYDROcodone -acetaminophen  (NORCO/VICODIN) 5-325 MG tablet Take 1 tablet by mouth every 6 (six) hours as needed for moderate pain (pain score 4-6). 02/16/24   Elnor Jayson LABOR, DO                                                                                                                                    Past Surgical History Past Surgical History:  Procedure Laterality Date   ABDOMINAL AORTOGRAM W/LOWER EXTREMITY N/A 06/20/2023   Procedure: ABDOMINAL AORTOGRAM W/LOWER EXTREMITY;  Surgeon: Pearline Norman RAMAN, MD;  Location: [facility] INVASIVE CV LAB;  Service: Cardiovascular;  Laterality: N/A;   AMPUTATION Left 06/24/2023   Procedure: LEFT 5TH TOE AMPUTATION;  Surgeon: Harden Jerona GAILS, MD;  Location: [facility] OR;  Service: Orthopedics;  Laterality: Left;   AMPUTATION Left 07/20/2023   Procedure: LEFT FOOT 4TH AND 5TH RAY AMPUTATION;  Surgeon: Harden Jerona GAILS, MD;  Location: [facility] OR;  Service: Orthopedics;  Laterality:  Left;   ANKLE SURGERY Right 1982   had extra bones in there; took them out   APPENDECTOMY  1975   BIOPSY  12/26/2018   Procedure: BIOPSY;  Surgeon: Golda Claudis PENNER, MD;  Location: [facility] ENDO SUITE;  Service: Endoscopy;;  duodenal biopsies   BIOPSY  03/23/2019   Procedure: BIOPSY;  Surgeon: Golda Claudis PENNER, MD;  Location: [facility] ENDO SUITE;  Service: Endoscopy;;  esophagus   CARDIAC CATHETERIZATION N/A 03/28/2015  Procedure: Left Heart Cath and Coronary Angiography;  Surgeon: Gordy Bergamo, MD;  Location: [facility] INVASIVE CV LAB;  Service: Cardiovascular;  Laterality: N/A;   CARDIAC CATHETERIZATION N/A 03/28/2015   Procedure: Intravascular Pressure Wire/FFR Study;  Surgeon: Gordy Bergamo, MD;  Location: [facility] INVASIVE CV LAB;  Service: Cardiovascular;  Laterality: N/A;   CARPAL TUNNEL RELEASE Left ~ 2008   COLONOSCOPY WITH ESOPHAGOGASTRODUODENOSCOPY (EGD)     COLONOSCOPY WITH PROPOFOL  N/A 12/24/2019   Procedure: COLONOSCOPY WITH PROPOFOL ;  Surgeon: Golda Claudis PENNER, MD;  Location: [facility];  Service: Endoscopy;  Laterality: N/A;  955   ELBOW FRACTURE SURGERY Left ~ 2008   ESOPHAGEAL MANOMETRY N/A 09/16/2021   Procedure: ESOPHAGEAL MANOMETRY (EM);  Surgeon: San Sandor GAILS, DO;  Location: [facility] ENDOSCOPY;  Service: Endoscopy;  Laterality: N/A;   ESOPHAGOGASTRODUODENOSCOPY N/A 12/26/2018   Procedure: ESOPHAGOGASTRODUODENOSCOPY (EGD);  Surgeon: Golda Claudis PENNER, MD;  Location: [facility];  Service: Endoscopy;  Laterality: N/A;   ESOPHAGOGASTRODUODENOSCOPY (EGD) WITH PROPOFOL  N/A 03/23/2019   Procedure: ESOPHAGOGASTRODUODENOSCOPY (EGD) WITH PROPOFOL ;  Surgeon: Golda Claudis PENNER, MD;  Location: [facility] SUITE;  Service: Endoscopy;  Laterality: N/A;  7:30   FRACTURE SURGERY     ankle and elbow   PERIPHERAL VASCULAR BALLOON ANGIOPLASTY  06/20/2023   Procedure: PERIPHERAL VASCULAR BALLOON ANGIOPLASTY;  Surgeon: Pearline Norman RAMAN, MD;  Location: [facility] INVASIVE CV LAB;  Service: Cardiovascular;;   PH IMPEDANCE STUDY N/A  09/16/2021   Procedure: PH IMPEDANCE STUDY;  Surgeon: San Sandor GAILS, DO;  Location: [facility] ENDOSCOPY;  Service: Endoscopy;  Laterality: N/A;   PILONIDAL CYST EXCISION N/A 03/24/2017   Procedure: EXCISION CHRONIC  PILONIDAL ABSCESS;  Surgeon: Vernetta Berg, MD;  Location: [facility] ORS;  Service: General;  Laterality: N/A;   SCROTAL EXPLORATION N/A 11/13/2020   Procedure: EXCISION OF SEBACEOUS CYSTS, SCROTUM;  Surgeon: Sherrilee Belvie CROME, MD;  Location: [facility] ORS;  Service: Urology;  Laterality: N/A;   TENDON REPAIR Left ~ 2004   main tendon in my ankle   Family History Family History  Problem Relation Age of Onset   Diabetes Mother    Hypertension Mother    Cancer Mother    Throat cancer Mother    Diabetes Father    Hypertension Father    Hyperlipidemia Father    Congestive Heart Failure Sister    Colon cancer Neg Hx    Pancreatic cancer Neg Hx    Liver disease Neg Hx     Social History Social History   Tobacco Use   Smoking status: Every Day    Current packs/day: 1.00    Average packs/day: 1 pack/day for 34.0 years (34.0 ttl pk-yrs)    Types: Cigarettes    Passive exposure: Never   Smokeless tobacco: Never   Tobacco comments:    1/2 ppd -1 ppd - patient states trying to cut back as of 06/18/23 km  Vaping Use   Vaping status: Never Used  Substance Use Topics   Alcohol use: Never    Alcohol/week: 0.0 standard drinks of alcohol   Drug use: Never   Allergies Bee venom, Morphine , Nexium [esomeprazole], and Prilosec otc [omeprazole magnesium ]  Review of Systems A thorough review of systems was obtained and all systems are negative except as noted in the HPI and PMH.   Physical Exam Vital Signs  I have reviewed the triage vital signs BP (!) 140/89   Pulse 78   Temp 98.4 F (36.9 C) (Oral)   Resp 12   Ht 5'  8 (1.727 m)   Wt 64.9 kg   SpO2 95%   BMI 21.74 kg/m  Physical Exam Vitals and nursing note reviewed.  Constitutional:      General: He is not in acute  distress.    Appearance: Normal appearance. He is well-developed. He is not ill-appearing.  HENT:     Head: Normocephalic and atraumatic.     Right Ear: External ear normal.     Left Ear: External ear normal.     Nose: Nose normal.     Mouth/Throat:     Mouth: Mucous membranes are moist.  Eyes:     General: No scleral icterus.       Right eye: No discharge.        Left eye: No discharge.  Cardiovascular:     Rate and Rhythm: Normal rate.  Pulmonary:     Effort: Pulmonary effort is normal. No respiratory distress.     Breath sounds: No stridor.  Abdominal:     General: Abdomen is flat. There is no distension.     Tenderness: There is no guarding.  Musculoskeletal:        General: No deformity.       Arms:     Cervical back: No rigidity.     Comments: Upper EXTREMITIES NVI bilateral  Skin:    General: Skin is warm and dry.     Coloration: Skin is not cyanotic, jaundiced or pale.      Neurological:     Mental Status: He is alert.  Psychiatric:        Speech: Speech normal.        Behavior: Behavior normal. Behavior is cooperative.     ED Results and Treatments Labs (all labs ordered are listed, but only abnormal results are displayed) Labs Reviewed  CBC WITH DIFFERENTIAL/PLATELET - Abnormal; Notable for the following components:      Result Value   WBC 11.0 (*)    RBC 3.52 (*)    Hemoglobin 11.0 (*)    HCT 33.1 (*)    Neutro Abs 8.7 (*)    All other components within normal limits  COMPREHENSIVE METABOLIC PANEL WITH GFR - Abnormal; Notable for the following components:   Glucose, Bld 140 (*)    BUN 22 (*)    Creatinine, Ser 1.62 (*)    Calcium  8.2 (*)    Total Protein 5.9 (*)    Albumin  2.6 (*)    AST 11 (*)    GFR, Estimated 50 (*)    All other components within normal limits  LIPASE, BLOOD  TROPONIN I (HIGH SENSITIVITY)  TROPONIN I (HIGH SENSITIVITY)                                                                                                                           Radiology DG Shoulder Right Result Date: 02/16/2024 CLINICAL DATA:  Recent right shoulder fracture, increasing pain, weakness EXAM: RIGHT SHOULDER - 2+ VIEW COMPARISON:  02/04/2024 FINDINGS: Internal and external view radiographs of the right shoulder are obtained. Right humeral neck fracture is again identified, with increased impaction and varus angulation since prior study. No dislocation. Soft tissue swelling surrounding the right shoulder. The right chest is clear. IMPRESSION: 1. Right humeral neck fracture again identified, with increased impaction and varus angulation since prior study. Electronically Signed   By: Ozell Daring M.D.   On: 02/16/2024 16:27   DG Chest Portable 1 View Result Date: 02/16/2024 CLINICAL DATA:  Recent fall, worsening right arm pain, weakness EXAM: PORTABLE CHEST 1 VIEW COMPARISON:  02/04/2024 FINDINGS: Single frontal view of the chest demonstrates an unremarkable cardiac silhouette. No airspace disease, effusion, or pneumothorax. Right humeral neck fracture is again identified, with increased impaction since prior study. IMPRESSION: 1. Right humeral neck fracture as above. 2. Otherwise no acute intrathoracic process. Electronically Signed   By: Ozell Daring M.D.   On: 02/16/2024 16:26    Pertinent labs & imaging results that were available during my care of the patient were reviewed by me and considered in my medical decision making (see MDM for details).  Medications Ordered in ED Medications  sodium chloride  0.9 % bolus 1,000 mL (1,000 mLs Intravenous New Bag/Given 02/16/24 1549)  ketorolac  (TORADOL ) 15 MG/ML injection 15 mg (15 mg Intravenous Given 02/16/24 1623)  midodrine  (PROAMATINE ) tablet 10 mg (10 mg Oral Given 02/16/24 1625)  ondansetron  (ZOFRAN ) injection 4 mg (4 mg Intravenous Given 02/16/24 2048)  HYDROmorphone  (DILAUDID ) injection 1 mg (1 mg Intravenous Given 02/16/24 2048)                                                                                                                                      Procedures Procedures  (including critical care time)  Medical Decision Making / ED Course    Medical Decision Making:    HASTON CASEBOLT is a 53 y.o. male with past medical history as below, significant for CAD, COPD, GERD, HLD, HTN, DM, orthostatic hypotension who presents to the ED with complaint of arm pain, weakness. The complaint involves an extensive differential diagnosis and also carries with it a high risk of complications and morbidity.  Serious etiology was considered. Ddx includes but is not limited to: Orthostatic hypotension, medication noncompliance, ongoing pain from recent injury, dehydration, metabolic derangement, infection, etc  Complete initial physical exam performed, notably the patient was in no acute distress, initially hypotensive but did not take his prescribed midodrine .    Reviewed and confirmed nursing documentation for past medical history, family history, social history.  Vital signs reviewed.    Right arm pain Postural dizziness vs orthostatic hypotension> - Patient recent admission following syncope and right humerus fracture.  Reports he ran out of his medication for his pain.  Pain is getting worse.  Patient also felt lightheaded, dizzy earlier today similar to prior episodes orthostatic hypotension. -Labs reviewed, these are stable, his  symptoms are greatly improved following fluids and analgesia.  He is ambulatory without assistance, tolerant p.o. that difficulty.  Blood pressure stable - X-ray of humerus and chest was obtained, does show persistent humerus fracture; labs unremarkable imaging -He has no chest pain, troponin is negative, EKG is nonischemic, ACS is unlikely at this time - Patient's dizziness/orthostatic hypotension is an ongoing concern, seems to be improving.  Patient is tolerant p.o. w/o difficulty.  He was encouraged to increase his p.o. water  intake.  Also encouraged  to take his midodrine  as prescribed.  Will provide further analgesia for right humerus fx as pt ran out today, he has sling at home that he did not bring.  He has follow-up orthopedics next week.   Clinical Course as of 02/16/24 2131  Thu Feb 16, 2024  1730 Creatinine(!): 1.62 Similar to prior  [SG]    Clinical Course User Index [SG] Elnor Jayson LABOR, DO     9:31 PM: Patient feeling better, back to baseline, amatory that assistance, HDS, pain greatly improved.  I have discussed the diagnosis/risks/treatment options with the patient.  Evaluation and diagnostic testing in the emergency department does not suggest an emergent condition requiring admission or immediate intervention beyond what has been performed at this time.  They will follow up with PCP and orthopedics. We also discussed returning to the ED immediately if new or worsening sx occur. We discussed the sx which are most concerning (e.g., sudden worsening pain, fever, inability to tolerate by mouth, syncope, chest pain) that necessitate immediate return.    The patient appears reasonably screen and/or stabilized for discharge and I doubt any other medical condition or other [facility] requiring further screening, evaluation, or treatment in the ED at this time prior to discharge.                 Additional history obtained: -Additional history obtained from na -External records from outside source obtained and reviewed including: Chart review including previous notes, labs, imaging, consultation notes including  Prior admission, PDMP, home medications   Lab Tests: -I ordered, reviewed, and interpreted labs.   The pertinent results include:   Labs Reviewed  CBC WITH DIFFERENTIAL/PLATELET - Abnormal; Notable for the following components:      Result Value   WBC 11.0 (*)    RBC 3.52 (*)    Hemoglobin 11.0 (*)    HCT 33.1 (*)    Neutro Abs 8.7 (*)    All other components within normal limits  COMPREHENSIVE METABOLIC  PANEL WITH GFR - Abnormal; Notable for the following components:   Glucose, Bld 140 (*)    BUN 22 (*)    Creatinine, Ser 1.62 (*)    Calcium  8.2 (*)    Total Protein 5.9 (*)    Albumin  2.6 (*)    AST 11 (*)    GFR, Estimated 50 (*)    All other components within normal limits  LIPASE, BLOOD  TROPONIN I (HIGH SENSITIVITY)  TROPONIN I (HIGH SENSITIVITY)    Notable for labs are stable  EKG   EKG Interpretation Date/Time:    Ventricular Rate:    PR Interval:    QRS Duration:    QT Interval:    QTC Calculation:   R Axis:      Text Interpretation:           Imaging Studies ordered: I ordered imaging studies including chest x-ray shoulder x-ray I independently visualized the following imaging with scope of interpretation limited to determining  acute life threatening conditions related to emergency care; findings noted above I agree with the radiologist interpretation If any imaging was obtained with contrast I closely monitored patient for any possible adverse reaction a/w contrast administration in the emergency department   Medicines ordered and prescription drug management: Meds ordered this encounter  Medications   sodium chloride  0.9 % bolus 1,000 mL   ketorolac  (TORADOL ) 15 MG/ML injection 15 mg   midodrine  (PROAMATINE ) tablet 10 mg   ondansetron  (ZOFRAN ) injection 4 mg   HYDROmorphone  (DILAUDID ) injection 1 mg   HYDROcodone -acetaminophen  (NORCO/VICODIN) 5-325 MG tablet    Sig: Take 1 tablet by mouth every 6 (six) hours as needed for moderate pain (pain score 4-6).    Dispense:  10 tablet    Refill:  0    -I have reviewed the patients home medicines and have made adjustments as needed   Consultations Obtained: na   Cardiac Monitoring: The patient was maintained on a cardiac monitor.  I personally viewed and interpreted the cardiac monitored which showed an underlying rhythm of: nsr Continuous pulse oximetry interpreted by myself, 98% on RA.    Social  Determinants of Health:  Diagnosis or treatment significantly limited by social determinants of health: current smoker   Reevaluation: After the interventions noted above, I reevaluated the patient and found that they have resolved  Co morbidities that complicate the patient evaluation  Past Medical History:  Diagnosis Date   Anxiety    Arthritis    Asthma    CAD (coronary artery disease)    a. 03/2015 Cath: LM nl, LAD mild diff dzs throughout w/ 50p/d, LCX diff dzs, 8m, RCA diff dzs, 54m-->Med rx; b. 12/2020 MV: EF 57%, small/mild apical inferior defect w/ partial rev/mild ischemia.   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, bilateral    Sleep apnea 2016   does not use CPAP   Type 2 diabetes mellitus (HCC)       Dispostion: Disposition decision including need for hospitalization was considered, and patient discharged from emergency department.    Final Clinical Impression(s) / ED Diagnoses Final diagnoses:  Right shoulder pain, unspecified chronicity  Postural dizziness        Elnor Jayson LABOR, DO 02/16/24 2131

## 2024-02-16 NOTE — Discharge Instructions (Signed)
 It was a pleasure caring for you today in the emergency department.  Be sure to drink plenty of clear liquids, stay hydrated.  Please wear your sling to help reduce shoulder discomfort as recommended by prior treatment team.  Please follow-up orthopedics at your next appointment, also please follow-up with your PCP.  I have sent a refill of your pain medication to your pharmacy.  Please return to the emergency department for any worsening or worrisome symptoms.

## 2024-02-16 NOTE — ED Triage Notes (Signed)
 Pt arrived via POV from home c/o right arm pain that radiates to his neck. Pt reports he has a current fracture in his arm form a fall earlier this month. Pt reports he has been bed ridden and can barely walk now due to his injury.

## 2024-02-16 NOTE — ED Notes (Signed)
 Pt requests ED Staff call his Brother if the Pt requires a ride home.

## 2024-02-16 NOTE — ED Notes (Signed)
 Pt states he takes Midodrine , but did not take this morning

## 2024-02-20 ENCOUNTER — Telehealth: Payer: Self-pay | Admitting: Radiology

## 2024-02-20 NOTE — Telephone Encounter (Signed)
 Pt is s/p a right humeral neck fracture 02/04/2024 ER and then in office 02/08/2024. Pt returned to the ER 02/16/2024 with increased pain. Repeat xray show more impaction and angulation per Dr. Harden ok to continue to wear sling. No pushing up or use or arm. Pt requesting refill on pain medication last fill  was 02/08/2024 #30 please advise.

## 2024-02-20 NOTE — Telephone Encounter (Signed)
 Patient requests refill of pain medication. [facility] [address]

## 2024-02-21 ENCOUNTER — Ambulatory Visit: Payer: MEDICAID | Admitting: Family

## 2024-02-21 MED ORDER — HYDROCODONE-ACETAMINOPHEN 5-325 MG PO TABS: 1.0000 | ORAL_TABLET | Freq: Four times a day (QID) | ORAL | 0 refills | Status: DC | PRN

## 2024-02-23 ENCOUNTER — Emergency Department (HOSPITAL_COMMUNITY)
Admission: EM | Admit: 2024-02-23 | Discharge: 2024-02-23 | Disposition: A | Discharge status: Home / Self Care | Payer: MEDICAID | Attending: Emergency Medicine | Admitting: Emergency Medicine

## 2024-02-23 ENCOUNTER — Emergency Department (HOSPITAL_COMMUNITY): Payer: MEDICAID

## 2024-02-23 ENCOUNTER — Encounter (HOSPITAL_COMMUNITY): Payer: Self-pay | Admitting: Emergency Medicine

## 2024-02-23 ENCOUNTER — Other Ambulatory Visit: Payer: Self-pay

## 2024-02-23 DIAGNOSIS — J449 Chronic obstructive pulmonary disease, unspecified: Secondary | ICD-10-CM | POA: Diagnosis not present

## 2024-02-23 DIAGNOSIS — Z79899 Other long term (current) drug therapy: Secondary | ICD-10-CM | POA: Diagnosis not present

## 2024-02-23 DIAGNOSIS — S42221D 2-part displaced fracture of surgical neck of right humerus, subsequent encounter for fracture with routine healing: Secondary | ICD-10-CM | POA: Diagnosis not present

## 2024-02-23 DIAGNOSIS — E1151 Type 2 diabetes mellitus with diabetic peripheral angiopathy without gangrene: Secondary | ICD-10-CM | POA: Insufficient documentation

## 2024-02-23 DIAGNOSIS — I2510 Atherosclerotic heart disease of native coronary artery without angina pectoris: Secondary | ICD-10-CM | POA: Insufficient documentation

## 2024-02-23 DIAGNOSIS — X58XXXD Exposure to other specified factors, subsequent encounter: Secondary | ICD-10-CM | POA: Diagnosis not present

## 2024-02-23 DIAGNOSIS — F333 Major depressive disorder, recurrent, severe with psychotic symptoms: Secondary | ICD-10-CM | POA: Diagnosis present

## 2024-02-23 DIAGNOSIS — M7989 Other specified soft tissue disorders: Secondary | ICD-10-CM | POA: Insufficient documentation

## 2024-02-23 DIAGNOSIS — I10 Essential (primary) hypertension: Secondary | ICD-10-CM | POA: Insufficient documentation

## 2024-02-23 DIAGNOSIS — R45851 Suicidal ideations: Secondary | ICD-10-CM | POA: Diagnosis not present

## 2024-02-23 DIAGNOSIS — F32A Depression, unspecified: Secondary | ICD-10-CM | POA: Insufficient documentation

## 2024-02-23 DIAGNOSIS — D72829 Elevated white blood cell count, unspecified: Secondary | ICD-10-CM | POA: Diagnosis not present

## 2024-02-23 DIAGNOSIS — F29 Unspecified psychosis not due to a substance or known physiological condition: Secondary | ICD-10-CM | POA: Insufficient documentation

## 2024-02-23 DIAGNOSIS — R531 Weakness: Secondary | ICD-10-CM | POA: Diagnosis not present

## 2024-02-23 DIAGNOSIS — Z794 Long term (current) use of insulin: Secondary | ICD-10-CM | POA: Insufficient documentation

## 2024-02-23 DIAGNOSIS — S4991XD Unspecified injury of right shoulder and upper arm, subsequent encounter: Secondary | ICD-10-CM | POA: Diagnosis present

## 2024-02-23 LAB — URINALYSIS, ROUTINE W REFLEX MICROSCOPIC
Bacteria, UA: NONE SEEN
Bilirubin Urine: NEGATIVE
Glucose, UA: 150 mg/dL — AB
Ketones, ur: NEGATIVE mg/dL
Leukocytes,Ua: NEGATIVE
Nitrite: NEGATIVE
Protein, ur: >=300 mg/dL — AB
Specific Gravity, Urine: 1.016 (ref 1.005–1.030)
pH: 5 (ref 5.0–8.0)

## 2024-02-23 LAB — RAPID URINE DRUG SCREEN, HOSP PERFORMED
Amphetamines: NOT DETECTED
Barbiturates: NOT DETECTED
Benzodiazepines: NOT DETECTED
Cocaine: NOT DETECTED
Opiates: POSITIVE — AB
Tetrahydrocannabinol: NOT DETECTED

## 2024-02-23 LAB — COMPREHENSIVE METABOLIC PANEL WITH GFR
ALT: 8 U/L (ref 0–44)
AST: 10 U/L — ABNORMAL LOW (ref 15–41)
Albumin: 2.8 g/dL — ABNORMAL LOW (ref 3.5–5.0)
Alkaline Phosphatase: 100 U/L (ref 38–126)
Anion gap: 14 (ref 5–15)
BUN: 39 mg/dL — ABNORMAL HIGH (ref 6–20)
CO2: 24 mmol/L (ref 22–32)
Calcium: 8 mg/dL — ABNORMAL LOW (ref 8.9–10.3)
Chloride: 99 mmol/L (ref 98–111)
Creatinine, Ser: 1.74 mg/dL — ABNORMAL HIGH (ref 0.61–1.24)
GFR, Estimated: 46 mL/min — ABNORMAL LOW (ref >60)
Glucose, Bld: 226 mg/dL — ABNORMAL HIGH (ref 70–99)
Potassium: 4.1 mmol/L (ref 3.5–5.1)
Sodium: 137 mmol/L (ref 135–145)
Total Bilirubin: 0.9 mg/dL (ref 0.0–1.2)
Total Protein: 5.7 g/dL — ABNORMAL LOW (ref 6.5–8.1)

## 2024-02-23 LAB — CBC
HCT: 33.5 % — ABNORMAL LOW (ref 39.0–52.0)
Hemoglobin: 11.1 g/dL — ABNORMAL LOW (ref 13.0–17.0)
MCH: 30.9 pg (ref 26.0–34.0)
MCHC: 33.1 g/dL (ref 30.0–36.0)
MCV: 93.3 fL (ref 80.0–100.0)
Platelets: 295 K/uL (ref 150–400)
RBC: 3.59 MIL/uL — ABNORMAL LOW (ref 4.22–5.81)
RDW: 13.4 % (ref 11.5–15.5)
WBC: 12 K/uL — ABNORMAL HIGH (ref 4.0–10.5)
nRBC: 0 % (ref 0.0–0.2)

## 2024-02-23 LAB — CBG MONITORING, ED
Glucose-Capillary: 219 mg/dL — ABNORMAL HIGH (ref 70–99)
Glucose-Capillary: 209 mg/dL — ABNORMAL HIGH (ref 70–99)

## 2024-02-23 LAB — ETHANOL: Alcohol, Ethyl (B): <15 mg/dL (ref <15)

## 2024-02-23 MED ORDER — ATORVASTATIN CALCIUM 40 MG PO TABS
80.0000 mg | ORAL_TABLET | Freq: Every day | ORAL | Status: DC
  Administered 2024-02-23: 80 mg via ORAL
  Filled 2024-02-23: qty 2

## 2024-02-23 MED ORDER — ONDANSETRON HCL 4 MG PO TABS: 4.0000 mg | ORAL_TABLET | Freq: Three times a day (TID) | ORAL | Status: DC | PRN

## 2024-02-23 MED ORDER — INSULIN GLARGINE 100 UNIT/ML ~~LOC~~ SOLN
12.0000 [IU] | Freq: Every day | SUBCUTANEOUS | Status: DC
  Administered 2024-02-23: 12 [IU] via SUBCUTANEOUS
  Filled 2024-02-23: qty 0.12

## 2024-02-23 MED ORDER — ACETAMINOPHEN 325 MG PO TABS
650.0000 mg | ORAL_TABLET | ORAL | Status: DC | PRN
  Administered 2024-02-23: 650 mg via ORAL
  Filled 2024-02-23: qty 2

## 2024-02-23 MED ORDER — ARIPIPRAZOLE 5 MG PO TABS: 10.0000 mg | ORAL_TABLET | Freq: Every morning | ORAL | Status: DC

## 2024-02-23 MED ORDER — ALUM & MAG HYDROXIDE-SIMETH 200-200-20 MG/5ML PO SUSP: 30.0000 mL | Freq: Four times a day (QID) | ORAL | Status: DC | PRN

## 2024-02-23 MED ORDER — INSULIN ASPART FLEXPEN 100 UNIT/ML ~~LOC~~ SOPN: 0.0000 [IU] | PEN_INJECTOR | Freq: Three times a day (TID) | SUBCUTANEOUS | Status: DC | PRN

## 2024-02-23 MED ORDER — OLANZAPINE 5 MG PO TBDP
10.0000 mg | ORAL_TABLET | Freq: Once | ORAL | Status: AC
  Administered 2024-02-23: 10 mg via ORAL
  Filled 2024-02-23: qty 2

## 2024-02-23 MED ORDER — OXYCODONE HCL 5 MG PO TABS: 5.0000 mg | ORAL_TABLET | Freq: Four times a day (QID) | ORAL | Status: DC | PRN

## 2024-02-23 MED ORDER — SERTRALINE HCL 50 MG PO TABS
200.0000 mg | ORAL_TABLET | Freq: Every day | ORAL | Status: DC
  Administered 2024-02-23: 200 mg via ORAL
  Filled 2024-02-23: qty 4

## 2024-02-23 MED ORDER — OLANZAPINE 10 MG PO TABS: 10.0000 mg | ORAL_TABLET | Freq: Every day | ORAL | 0 refills | Status: AC

## 2024-02-23 MED ORDER — EZETIMIBE 10 MG PO TABS
10.0000 mg | ORAL_TABLET | Freq: Every day | ORAL | Status: DC
  Administered 2024-02-23: 10 mg via ORAL
  Filled 2024-02-23: qty 1

## 2024-02-23 MED ORDER — INSULIN ASPART 100 UNIT/ML IJ SOLN: 0.0000 [IU] | Freq: Three times a day (TID) | INTRAMUSCULAR | Status: DC

## 2024-02-23 NOTE — ED Notes (Signed)
 Particia, brothers girlfriend for a ride if needed [phone]

## 2024-02-23 NOTE — ED Notes (Addendum)
 Received report from [facility]. Pt resting. A/o. Pt admits to seeing things like animals that are not there. Has been going on for about a year. States other people dont see them so I just shake my head and go on. Pt polite and cooperative. Bruising and swelling noted to right upper arm. Sling/swath applied.pt denies si/hi but states he was feeling si earlier. States he feels better now.

## 2024-02-23 NOTE — ED Provider Notes (Signed)
 Pt has been seen by telepsych (Dr. Camellia).  She recommends outpatient f/u with his psychiatrist.  She also recommends discontinuing the abilify  and starting olanzapine .    Dean Clarity, MD 02/23/24 2207

## 2024-02-23 NOTE — Progress Notes (Signed)
 [facility] Consult Note  Patient Name: [name] [name] MRN: [id number] DOB: [birth year] DATE OF Consult: 02/23/2024  PRIMARY PSYCHIATRIC DIAGNOSES  1.  Major depressive disorder, severe, recurrent, with psychotic features   RECOMMENDATIONS  Recommendations: Medication recommendations: Discontinue Abilify . Start Olanzapine  10 mg at bedtime, continue Sertraline  200 mg daily.  Non-Medication/therapeutic recommendations: Follow up with outpatient psychiatrist  Is inpatient psychiatric hospitalization recommended for this patient? No (Explain why): not at risk to self or others. [place] safe discharging Follow-Up Telepsychiatry C/L services: We will sign off for now. Please re-consult our service if needed for any concerning changes in the patient's condition, discharge planning, or questions.  Thank you for involving us  in the care of this patient. If you have any additional questions or concerns, please call [phone] and ask for me or the provider on-call.  TELEPSYCHIATRY ATTESTATION & CONSENT  As the provider for this telehealth consult, I attest that I verified the patient's identity using two separate identifiers, introduced myself to the patient, provided my credentials, disclosed my location, and performed this encounter via a HIPAA-compliant, real-time, face-to-face, two-way, interactive audio and video platform and with the full consent and agreement of the patient (or guardian as applicable.)  Patient physical location: [facility] at [facility] . Telehealth provider physical location: home office in state of [place] .  Video start time: 7:45 pm  (Central Time) Video end time: 8:15 pm  (Central Time)  IDENTIFYING DATA  [name] SCHLIEP is a 53 y.o. year-old male for whom a psychiatric consultation has been ordered by the primary provider. The patient was identified using two separate identifiers.  CHIEF COMPLAINT/REASON FOR CONSULT  Hallucinations    HISTORY OF PRESENT ILLNESS (HPI)  The patient is a 53 year old male, who presents to the emergency department with weakness and complaining of visual and auditory hallucinations.  When I see the patient, he is calm and cooperative.  He tells me he came to the emergency department today because he has been having hallucinations.  He says they are about that, he is scared of death and cannot deal with that.  He says he keeps trying to ignore the hallucinations but it is getting to him.  He says that his sister left him, she was the youngest and she has not gotten over that.  Sister died in 03/25/2010 and that mom died in 2021-03-25 of COVID-pneumonia.  They were both very caring with him.  He currently lives with his 42 year old dad.  He currently sees a psychiatrist and a therapist.  Says he takes what ever medications are prescribed.  He psychiatrist does not know that he has been having hallucinations as he keeps trying to ignore it.  He has a coming appointment with his therapist soon and says that he will let her know about it.  We discussed suicidal ideations, he says he always has them but will never act on it.  Those are chronic. I suggested medication adjustment, discontinuing Abilify  and starting olanzapine  targeting hallucinations and he is agreeable.  He feels safe discharging home and following up with his outpatient psychiatrist.  PAST PSYCHIATRIC HISTORY   Otherwise as per HPI above.  PAST MEDICAL HISTORY  Past Medical History:  Diagnosis Date   Anxiety    Arthritis    Asthma    CAD (coronary artery disease)    a. 03/2015 Cath: LM nl, LAD mild diff dzs throughout w/ 50p/d, LCX diff dzs, 24m, RCA diff dzs, 51m-->Med rx; b. 12/2020 MV: EF  57%, small/mild apical inferior defect w/ partial rev/mild ischemia.   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, bilateral    Sleep apnea 2016   does not use CPAP   Type 2 diabetes mellitus (HCC)      HOME MEDICATIONS  Facility Ordered Medications  Medication   acetaminophen  (TYLENOL ) tablet 650 mg   ondansetron  (ZOFRAN ) tablet 4 mg   alum & mag hydroxide-simeth (MAALOX/MYLANTA) 200-200-20 MG/5ML suspension 30 mL   oxyCODONE  (Oxy IR/ROXICODONE ) immediate release tablet 5 mg   atorvastatin  (LIPITOR ) tablet 80 mg   [START ON 02/24/2024] ARIPiprazole  (ABILIFY ) tablet 10 mg   ezetimibe  (ZETIA ) tablet 10 mg   insulin  glargine (LANTUS ) injection 12 Units   sertraline  (ZOLOFT ) tablet 200 mg   [START ON 02/24/2024] insulin  aspart (novoLOG ) injection 0-15 Units   PTA Medications  Medication Sig   ARIPiprazole  (ABILIFY ) 10 MG tablet Take 10 mg by mouth in the morning.   sertraline  (ZOLOFT ) 100 MG tablet Take 2 tablets (200 mg total) by mouth at bedtime.   insulin  glargine (LANTUS  SOLOSTAR) 100 UNIT/ML Solostar Pen Inject 12 Units into the skin daily.   atorvastatin  (LIPITOR ) 80 MG tablet Take 1 tablet (80 mg total) by mouth at bedtime.   ezetimibe  (ZETIA ) 10 MG tablet Take 1 tablet (10 mg total) by mouth daily.   amLODipine  (NORVASC ) 10 MG tablet Take 1 tablet (10 mg total) by mouth at bedtime.   midodrine  (PROAMATINE ) 5 MG tablet Take 1 tablet (5 mg total) by mouth 3 (three) times daily with meals.   Insulin  Aspart FlexPen (NOVOLOG ) 100 UNIT/ML Inject 0-5 Units into the skin 3 (three) times daily as needed (high blood sugar). Sliding scale   acetaminophen  (TYLENOL ) 500 MG tablet Take 1,000 mg by mouth every 6 (six) hours as needed for headache.   HYDROcodone -acetaminophen  (NORCO/VICODIN) 5-325 MG tablet Take 1 tablet by mouth every 6 (six) hours as needed for moderate pain (pain score 4-6).     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  Tobacco Use   Smoking status: Every Day    Current packs/day: 1.00    Average packs/day: 1 pack/day for 34.0 years (34.0 ttl pk-yrs)    Types: Cigarettes    Passive exposure: Never   Smokeless tobacco: Never   Tobacco comments:    1/2 ppd -1 ppd - patient states trying to cut back as of 06/18/23 km  Vaping Use   Vaping status: Never Used  Substance  and Sexual Activity   Alcohol use: Never    Alcohol/week: 0.0 standard drinks of alcohol   Drug use: Never   Sexual activity: Not Currently  Other Topics Concern   Not on file  Social History Narrative   His main caretaker, his mother has terminal metastatic cancer.   Social Drivers of Health   Financial Resource Strain: High Risk (06/24/2022)   Overall Financial Resource Strain (CARDIA)    Difficulty of Paying Living Expenses: Very hard  Food Insecurity: Low Risk  (02/10/2024)   Received from [facility]   Hunger Vital Sign    Within the past 12 months, you worried that your food would run out before you got money to buy more: Never true    Within the past 12 months, the food you bought just didn't last and you didn't have money to get more. : Never true  Transportation Needs: No Transportation Needs (02/10/2024)   Received from [facility]    In the past 12 months, has lack of reliable transportation kept you from medical appointments, meetings, work or from  getting things needed for daily living? : No  Physical Activity: Not on file  Stress: Not on file  Social Connections: Unknown (05/25/2022)   Received from [facility]   Social Network    Social Network: Not on file   Social History   Tobacco Use  Smoking Status Every Day   Current packs/day: 1.00   Average packs/day: 1 pack/day for 34.0 years (34.0 ttl pk-yrs)   Types: Cigarettes   Passive exposure: Never  Smokeless Tobacco Never  Tobacco Comments   1/2 ppd -1 ppd - patient states trying to cut back as of 06/18/23 km   Social History   Substance and Sexual Activity  Alcohol Use Never   Alcohol/week: 0.0 standard drinks of alcohol   Social History   Substance and Sexual Activity  Drug Use Never    Additional pertinent information .  FAMILY HISTORY  Family History  Problem Relation Age of Onset   Diabetes Mother    Hypertension Mother    Cancer Mother    Throat cancer Mother    Diabetes Father    Hypertension Father    Hyperlipidemia Father    Congestive Heart Failure Sister    Colon cancer Neg Hx    Pancreatic cancer Neg Hx    Liver disease Neg Hx     MENTAL STATUS EXAM (MSE)  Mental Status Exam: General Appearance: Casual  Orientation:  Full (Time, Place, and Person)  Memory:  Immediate;   Fair  Concentration:  Concentration: Fair  Recall:  Fair  Attention  Fair  Eye Contact:  Good  Speech:  Normal Rate  Language:  Good  Volume:  Normal  Mood: depressed  Affect:  Tearful  Thought Process:  Linear  Thought Content:  Hallucinations: Auditory Visual  Suicidal Thoughts:  Yes.  without intent/plan  Homicidal Thoughts:  No  Judgement:  Fair  Insight:  Fair  Psychomotor Activity:  Normal  Akathisia:  No  Fund of Knowledge:  Fair    Assets:  Desire for Improvement Housing  Cognition:  WNL  ADL's:  Intact  AIMS (if indicated):       VITALS  Blood pressure (!) 165/92, pulse 89, temperature 98.5 F (36.9 C), temperature source Oral, resp. rate  16, height 5' 8 (1.727 m), weight 65 kg, SpO2 98%.  LABS  Admission on 02/23/2024  Component Date Value Ref Range Status  Sodium 02/23/2024 137  135 - 145 mmol/L Final   Potassium 02/23/2024 4.1  3.5 - 5.1 mmol/L Final   Chloride 02/23/2024 99  98 - 111 mmol/L Final   CO2 02/23/2024 24  22 - 32 mmol/L Final   Glucose, Bld 02/23/2024 226 (H)  70 - 99 mg/dL Final   Glucose reference range applies only to samples taken after fasting for at least 8 hours.   BUN 02/23/2024 39 (H)  6 - 20 mg/dL Final   Creatinine, Ser 02/23/2024 1.74 (H)  0.61 - 1.24 mg/dL Final   Calcium  02/23/2024 8.0 (L)  8.9 - 10.3 mg/dL Final   Total Protein 90/74/7974 5.7 (L)  6.5 - 8.1 g/dL Final   Albumin  02/23/2024 2.8 (L)  3.5 - 5.0 g/dL Final   AST 90/74/7974 10 (L)  15 - 41 U/L Final   ALT 02/23/2024 8  0 - 44 U/L Final   Alkaline Phosphatase 02/23/2024 100  38 - 126 U/L Final   Total Bilirubin 02/23/2024 0.9  0.0 - 1.2 mg/dL Final   GFR, Estimated 02/23/2024 46 (L)  >60 mL/min Final   Comment: (NOTE) Calculated using the CKD-EPI Creatinine Equation (2021)    Anion gap 02/23/2024 14  5 - 15 Final   Performed at [facility], [address], [place], [place] [address]   WBC 02/23/2024 12.0 (H)  4.0 - 10.5 K/uL Final   RBC 02/23/2024 3.59 (L)  4.22 - 5.81 MIL/uL Final   Hemoglobin 02/23/2024 11.1 (L)  13.0 - 17.0 g/dL Final   HCT 90/74/7974 33.5 (L)  39.0 - 52.0 % Final   MCV 02/23/2024 93.3  80.0 - 100.0 fL Final   MCH 02/23/2024 30.9  26.0 - 34.0 pg Final   MCHC 02/23/2024 33.1  30.0 - 36.0 g/dL Final   RDW 90/74/7974 13.4  11.5 - 15.5 % Final   Platelets 02/23/2024 295  150 - 400 K/uL Final   nRBC 02/23/2024 0.0  0.0 - 0.2 % Final   Performed at [facility], [address], [place], [place] [address]   Glucose-Capillary 02/23/2024 219 (H)  70 - 99 mg/dL Final   Glucose reference range applies only to samples taken after fasting for at least 8 hours.   Color, Urine 02/23/2024 YELLOW  YELLOW Final    APPearance 02/23/2024 CLEAR  CLEAR Final   Specific Gravity, Urine 02/23/2024 1.016  1.005 - 1.030 Final   pH 02/23/2024 5.0  5.0 - 8.0 Final   Glucose, UA 02/23/2024 150 (A)  NEGATIVE mg/dL Final   Hgb urine dipstick 02/23/2024 SMALL (A)  NEGATIVE Final   Bilirubin Urine 02/23/2024 NEGATIVE  NEGATIVE Final   Ketones, ur 02/23/2024 NEGATIVE  NEGATIVE mg/dL Final   Protein, ur 90/74/7974 >=300 (A)  NEGATIVE mg/dL Final   Nitrite 90/74/7974 NEGATIVE  NEGATIVE Final   Leukocytes,Ua 02/23/2024 NEGATIVE  NEGATIVE Final   RBC / HPF 02/23/2024 6-10  0 - 5 RBC/hpf Final   WBC, UA 02/23/2024 0-5  0 - 5 WBC/hpf Final   Bacteria, UA 02/23/2024 NONE SEEN  NONE SEEN Final   Squamous Epithelial / HPF 02/23/2024 0-5  0 - 5 /HPF Final   Hyaline Casts, UA 02/23/2024 PRESENT   Final   Performed at [facility], [address], [place], [place] [address]   Opiates 02/23/2024 POSITIVE (A)  NONE DETECTED Final   Cocaine 02/23/2024 NONE DETECTED  NONE DETECTED Final   Benzodiazepines 02/23/2024 NONE DETECTED  NONE DETECTED Final   Amphetamines 02/23/2024 NONE  DETECTED  NONE DETECTED Final   Tetrahydrocannabinol 02/23/2024 NONE DETECTED  NONE DETECTED Final   Barbiturates 02/23/2024 NONE DETECTED  NONE DETECTED Final   Comment: (NOTE) DRUG SCREEN FOR MEDICAL PURPOSES ONLY.  IF CONFIRMATION IS NEEDED FOR ANY PURPOSE, NOTIFY LAB WITHIN 5 DAYS.  LOWEST DETECTABLE LIMITS FOR URINE DRUG SCREEN Drug Class                     Cutoff (ng/mL) Amphetamine and metabolites    1000 Barbiturate and metabolites    200 Benzodiazepine                 200 Opiates and metabolites        300 Cocaine and metabolites        300 THC                            50 Performed at [facility], [address] [place], [place] [address]    Alcohol, Ethyl (B) 02/23/2024 <15  <15 mg/dL Final   Comment: (NOTE) For medical purposes only. Performed at [facility], [address], [place], [place] [address]     PSYCHIATRIC REVIEW  OF SYSTEMS (ROS)  ROS: Notable for the following relevant positive findings: ROS  Additional findings:      Musculoskeletal: No abnormal movements observed      Gait & Station: Laying/Sitting      Pain Screening: Denies    RISK FORMULATION/ASSESSMENT  Is the patient experiencing any suicidal or homicidal ideations: Yes       Explain if yes: Chronic. No plan or intent  Protective factors considered for safety management: help seeking   Risk factors/concerns considered for safety management:  Depression Male gender Unmarried  Is there a safety management plan with the patient and treatment team to minimize risk factors and promote protective factors: Yes           Explain: medication adjustment  Is crisis care placement or psychiatric hospitalization recommended: No     Based on my current evaluation and risk assessment, patient is determined at this time to be at:  Low risk  *RISK ASSESSMENT Risk assessment is a dynamic process; it is possible that this patient's condition, and risk level, may change. This should be re-evaluated and managed over time as appropriate. Please re-consult psychiatric consult services if additional assistance is needed in terms of risk assessment and management. If your team decides to discharge this patient, please advise the patient how to best access emergency psychiatric services, or to call 911, if their condition worsens or they feel unsafe in any way.   Berle JAYSON Gibney, MD Telepsychiatry Consult ServicesPatient ID: Salomon LITTIE Cliche, male   DOB: 10-03-1970, 53 y.o.   MRN: [id number]

## 2024-02-23 NOTE — BH Assessment (Signed)
 TTS consult has been deferred to [facility] the coordinator will schedule a time for the assessment

## 2024-02-23 NOTE — ED Provider Notes (Cosign Needed Addendum)
 [place] EMERGENCY DEPARTMENT AT [facility] Provider Note   CSN: [id number] Arrival date & time: 02/23/24  1220     Patient presents with: Weakness   [name] [name] is a 53 y.o. male with a history including type 2 diabetes, COPD, hypertension peripheral vascular disease with history of left toe amputations, history of depression, CAD with current active right humeral neck fracture presenting with multiple complaints. He endorses generalized weakness and fatigue, also with complaint of difficulty sleeping,  increased depression and suicidal thoughts but no active plan.  He denies homicidal ideation. He has had episodes of visual and auditory hallucinations as well,  for example, called his brother several nights ago and was concerned about their sister who had been raped.  Brother stated to him their sister has been deceased for many years.  Pt replied our other sister, but brother confirms they only had one sister. With another event,  pt called him panicked that there were police and doctors outside his window looking in.  Brother lives on the other side of duplex from brother and pt lives with father and confirms he has not had problems with hallucinations in the past.  Pt denies etoh or drug use.    He has complaint also of persistent pain in the right arm and is distraught that ortho will not do surgery. Explained to pt this injury needs to heal by wearing the sling at all times which he has not been compliant with (and presents without it).     The history is provided by the patient and a relative (Brother Dorise Hockman per phone, with pt permission, and confirmed pt and brother relationship for confidentiality).       Prior to Admission medications   Medication Sig Start Date End Date Taking? Authorizing Provider  acetaminophen  (TYLENOL ) 500 MG tablet Take 1,000 mg by mouth every 6 (six) hours as needed for headache.   Yes [provider]  amLODipine  (NORVASC )  10 MG tablet Take 1 tablet (10 mg total) by mouth at bedtime. 02/06/24 03/07/24 Yes Shahmehdi, Adriana LABOR, MD  ARIPiprazole  (ABILIFY ) 10 MG tablet Take 10 mg by mouth in the morning.   Yes [provider]  atorvastatin  (LIPITOR ) 80 MG tablet Take 1 tablet (80 mg total) by mouth at bedtime. 12/01/23  Yes Debera Jayson MATSU, MD  ezetimibe  (ZETIA ) 10 MG tablet Take 1 tablet (10 mg total) by mouth daily. 12/01/23 02/29/24 Yes Debera Jayson MATSU, MD  HYDROcodone -acetaminophen  (NORCO/VICODIN) 5-325 MG tablet Take 1 tablet by mouth every 6 (six) hours as needed for moderate pain (pain score 4-6). 02/21/24  Yes Zamora, Erin R, NP  Insulin  Aspart FlexPen (NOVOLOG ) 100 UNIT/ML Inject 0-5 Units into the skin 3 (three) times daily as needed (high blood sugar). Sliding scale   Yes [provider]  insulin  glargine (LANTUS  SOLOSTAR) 100 UNIT/ML Solostar Pen Inject 12 Units into the skin daily. 10/27/23  Yes Shamleffer, Ibtehal Jaralla, MD  midodrine  (PROAMATINE ) 5 MG tablet Take 1 tablet (5 mg total) by mouth 3 (three) times daily with meals. 02/06/24 03/07/24 Yes Shahmehdi, Adriana LABOR, MD  sertraline  (ZOLOFT ) 100 MG tablet Take 2 tablets (200 mg total) by mouth at bedtime. 08/30/22  Yes Christobal Guadalajara, MD    Allergies: Bee venom, Morphine , Nexium [esomeprazole], and Prilosec otc [omeprazole magnesium ]    Review of Systems  Constitutional:  Negative for fever.  HENT:  Negative for congestion.   Eyes: Negative.   Respiratory:  Negative for chest tightness  and shortness of breath.   Cardiovascular:  Negative for chest pain.  Gastrointestinal:  Negative for abdominal pain and nausea.  Genitourinary: Negative.   Musculoskeletal:  Positive for arthralgias. Negative for joint swelling and neck pain.  Skin: Negative.  Negative for rash and wound.  Neurological:  Negative for dizziness, weakness, light-headedness, numbness and headaches.  Psychiatric/Behavioral:  Positive for confusion, dysphoric mood, hallucinations,  sleep disturbance and suicidal ideas.     Updated Vital Signs BP (!) 165/92   Pulse 89   Temp 98.5 F (36.9 C) (Oral)   Resp 16   Ht 5' 8 (1.727 m)   Wt 65 kg   SpO2 98%   BMI 21.79 kg/m   Physical Exam Vitals and nursing note reviewed.  Constitutional:      Appearance: He is well-developed.  HENT:     Head: Normocephalic and atraumatic.  Eyes:     Conjunctiva/sclera: Conjunctivae normal.  Cardiovascular:     Rate and Rhythm: Normal rate and regular rhythm.     Pulses:          Radial pulses are 2+ on the right side and 2+ on the left side.     Heart sounds: Normal heart sounds.  Pulmonary:     Effort: Pulmonary effort is normal.     Breath sounds: Normal breath sounds. No wheezing.  Abdominal:     General: Bowel sounds are normal.     Palpations: Abdomen is soft.     Tenderness: There is no abdominal tenderness.  Musculoskeletal:        General: Normal range of motion.     Cervical back: Normal range of motion.  Skin:    General: Skin is warm and dry.  Neurological:     Mental Status: He is alert and oriented to person, place, and time.     Cranial Nerves: No cranial nerve deficit.     Sensory: No sensory deficit.     Motor: Weakness present.     Comments: 4/5 grip strength right,  5/5 left  Psychiatric:        Mood and Affect: Mood is depressed.        Speech: Speech normal.        Behavior: Behavior is cooperative.        Thought Content: Thought content includes suicidal ideation. Thought content does not include homicidal ideation. Thought content does not include suicidal plan.     Comments: Tearful at times during eval.  No auditory or visual hallucinations during presentation here.      (all labs ordered are listed, but only abnormal results are displayed) Labs Reviewed  COMPREHENSIVE METABOLIC PANEL WITH GFR - Abnormal; Notable for the following components:      Result Value   Glucose, Bld 226 (*)    BUN 39 (*)    Creatinine, Ser 1.74 (*)     Calcium  8.0 (*)    Total Protein 5.7 (*)    Albumin  2.8 (*)    AST 10 (*)    GFR, Estimated 46 (*)    All other components within normal limits  CBC - Abnormal; Notable for the following components:   WBC 12.0 (*)    RBC 3.59 (*)    Hemoglobin 11.1 (*)    HCT 33.5 (*)    All other components within normal limits  URINALYSIS, ROUTINE W REFLEX MICROSCOPIC - Abnormal; Notable for the following components:   Glucose, UA 150 (*)    Hgb urine dipstick SMALL (*)  Protein, ur >=300 (*)    All other components within normal limits  RAPID URINE DRUG SCREEN, HOSP PERFORMED - Abnormal; Notable for the following components:   Opiates POSITIVE (*)    All other components within normal limits  CBG MONITORING, ED - Abnormal; Notable for the following components:   Glucose-Capillary 219 (*)    All other components within normal limits  ETHANOL    EKG: None  Radiology: DG Shoulder Right Result Date: 02/23/2024 CLINICAL DATA:  Worsening pain and numbness to the right hand with known humeral neck fracture. Patient fell. EXAM: RIGHT SHOULDER - 2+ VIEW COMPARISON:  02/16/2024 FINDINGS: Acute appearing right humeral neck fracture is again demonstrated similar to prior study. There is lateral displacement of the distal fracture fragment resulting in varus angulation of the humeral head and neck. Varus angulation is increased since the prior study. No glenohumeral dislocation. Coracoclavicular and acromioclavicular spaces are normal. Soft tissues are unremarkable. IMPRESSION: Fracture of the right humeral neck with varus angulation again identified. Since the previous study, there is more prominent lateral displacement of the distal fracture fragment and varus angulation. Electronically Signed   By: Elsie Gravely M.D.   On: 02/23/2024 16:16   CT HEAD WO CONTRAST Result Date: 02/23/2024 CLINICAL DATA:  Headache with weakness 2-3 days. Visual and auditory hallucinations. EXAM: CT HEAD WITHOUT CONTRAST  TECHNIQUE: Contiguous axial images were obtained from the base of the skull through the vertex without intravenous contrast. RADIATION DOSE REDUCTION: This exam was performed according to the departmental dose-optimization program which includes automated exposure control, adjustment of the mA and/or kV according to patient size and/or use of iterative reconstruction technique. COMPARISON:  02/04/2024 FINDINGS: Brain: Ventricles, cisterns and other CSF spaces are normal. No mass, mass effect, shift of midline structures or acute hemorrhage. No evidence of acute infarction. Vascular: No hyperdense vessel or unexpected calcification. Skull: Normal. Negative for fracture or focal lesion. Sinuses/Orbits: No acute finding. Other: Bone island over the right mandibular condyle. IMPRESSION: No acute findings. Electronically Signed   By: Toribio Agreste M.D.   On: 02/23/2024 15:40     Procedures   Medications Ordered in the ED  acetaminophen  (TYLENOL ) tablet 650 mg (has no administration in time range)  ondansetron  (ZOFRAN ) tablet 4 mg (has no administration in time range)  alum & mag hydroxide-simeth (MAALOX/MYLANTA) 200-200-20 MG/5ML suspension 30 mL (has no administration in time range)  oxyCODONE  (Oxy IR/ROXICODONE ) immediate release tablet 5 mg (has no administration in time range)                                    Medical Decision Making Patient presenting with generalized fatigue along with increasing depression and suicidal ideation without plan or action.  Having new hallucinations as well with unclear source.  From medical standpoint he is cleared and is currently pending TTS evaluation.  Patient was placed in a sling and a swath to better protect his humeral neck fracture.  Psych holding orders placed  Amount and/or Complexity of Data Reviewed Labs: ordered.    Details: Labs reviewed including urinalysis, c-Met revealing a glucose of 226, there is no anion gap and he has a normal CO2.  His  BUN is 39, he has a creatinine of 1.74 which is relatively stable in comparison to prior BUN and creatinines.  His UDS is positive for opiates however he is prescribed hydrocodone  with his current fracture.  His  CBC shows a mild leukocytosis of 12.0, his hemoglobin is 11.1 which is also stable. Radiology: ordered.    Details: CT head negative for acute findings, right shoulder was reimaged, there is a more prominent lateral displacement, varus angulation is similar to initial imaging.  Was placed back in his sling and swath, strongly encouraged him to be compliant with his treatment.  Patient is neurovascularly intact.  Risk Diagnosis or treatment significantly limited by social determinants of health.        Final diagnoses:  Depression with suicidal ideation  Closed 2-part displaced fracture of surgical neck of right humerus with routine healing, subsequent encounter    ED Discharge Orders     None          Birdena Mliss RIGGERS 02/23/24 1849    Birdena Mliss, PA-C 02/23/24 JACKEY Patsey Lot, MD 02/24/24 [phone]

## 2024-02-23 NOTE — ED Notes (Signed)
 Safety sitter is present at bedside

## 2024-02-23 NOTE — ED Notes (Signed)
 Meal given. Pt states he did not get a dinner tray. Water  given

## 2024-02-23 NOTE — ED Triage Notes (Signed)
 Pt bib rcems for weakness x2-3 days & also not taking his home medications for the same amount of time. Pt endorses visual and auditory hallucinations. Also endorses SI. Pt is A/O x4.

## 2024-02-23 NOTE — ED Notes (Signed)
 Patient transported to CT

## 2024-02-27 ENCOUNTER — Ambulatory Visit: Payer: MEDICAID | Admitting: Internal Medicine

## 2024-02-27 NOTE — Progress Notes (Deleted)
 Name: [name] [name]  MRN/ DOB: [id number], 1970-07-23   Age/ Sex: 53 y.o., male    PCP: [name], PA-C   Reason for Endocrinology Evaluation: Type 2 Diabetes Mellitus     Date of Initial Endocrinology Visit: 04/11/2023    PATIENT IDENTIFIER: [name] is a 53 y.o. male with a past medical history of DM, HTN, CAD, COPD, OSA. The patient presented for initial endocrinology clinic visit on 04/11/2023 for consultative assistance with his diabetes management.    HPI: [name] [name]    Diagnosed with DM 2017                Hemoglobin A1c has ranged from 7.8% in 2017, peaking at 15.5% in 2022.  Patient with history of DKA   On his initial visit to our clinic he had an A1c of 9.0%, patient was on NovoLog  Mix, metformin , and I switched him to Lantus /NovoLog   Metformin  discontinued during hospitalization 06/2023  I had prescribed OmniPod 07/2023 and referred him to our CDE for training, but per patient this would not be covered by his insurance  SUBJECTIVE:   During the last visit (11/01/2023): A1c 7.3%  Today (02/27/24): [name] [name] is here for follow-up on diabetes management. He checks his blood sugars both times daily. The patient has not had hypoglycemic episodes since the last clinic visit.    Patient continues to follow-up with Dr. Harden for history of left fifth toe amputation that occurred 06/2023, he is s/p left fourth toe amputation 07/2023  Patient follows with pulmonary for COPD He did present to the ED with syncope and right shoulder pain, workup unrevealing, and he was advised to follow-up with cardiology.  He was also noted with right shoulder humeral neck fracture in September, 2025  Patient presented to the ED again with depression and suicidal ideation on 02/23/2024  Historically he has had chronic constipation, no other GI symptoms   HOME DIABETES REGIMEN: Lantus  12 units daily Novolog  8 units TIDQAC CF: NovoLog  (BG -130/45) TIDQAC   Statin:  yes ACE-I/ARB: no   CONTINUOUS GLUCOSE MONITORING RECORD INTERPRETATION    Dates of Recording: 5/21-11/01/2023  Sensor description:freestyle libre 3  Results statistics:   CGM use % of time 98  Average and SD 196/35.4  Time in range 47 %  % Time Above 180 32  % Time above 250 20  % Time Below target 1   Glycemic patterns summary: BGs are optimal overnight and increased throughout the day  Hyperglycemic episodes postprandial  Hypoglycemic episodes occurred N/A in the past 2 weeks  Overnight periods:  optimal   DIABETIC COMPLICATIONS: Microvascular complications:  Neuropathy, s/p left fifth toe amputation Denies: CKD  Last eye exam: Completed 2 yrs   Macrovascular complications:  CAD Denies:  PVD, CVA   PAST HISTORY: Past Medical History:  Past Medical History:  Diagnosis Date   Anxiety    Arthritis    Asthma    CAD (coronary artery disease)    a. 03/2015 Cath: LM nl, LAD mild diff dzs throughout w/ 50p/d, LCX diff dzs, 58m, RCA diff dzs, 51m-->Med rx; b. 12/2020 MV: EF 57%, small/mild apical inferior defect w/ partial rev/mild ischemia.   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, bilateral    Sleep apnea 2016   does not use CPAP   Type 2 diabetes mellitus (HCC)    Past Surgical History:  Past Surgical History:  Procedure Laterality Date   ABDOMINAL AORTOGRAM W/LOWER EXTREMITY N/A 06/20/2023   Procedure: ABDOMINAL AORTOGRAM W/LOWER EXTREMITY;  Surgeon: Pearline Norman RAMAN, MD;  Location: [facility] INVASIVE CV LAB;  Service: Cardiovascular;  Laterality: N/A;   AMPUTATION Left 06/24/2023   Procedure: LEFT 5TH TOE AMPUTATION;   Surgeon: Harden Jerona GAILS, MD;  Location: [facility] OR;  Service: Orthopedics;  Laterality: Left;   AMPUTATION Left 07/20/2023   Procedure: LEFT FOOT 4TH AND 5TH RAY AMPUTATION;  Surgeon: Harden Jerona GAILS, MD;  Location: [facility] OR;  Service: Orthopedics;  Laterality: Left;   ANKLE SURGERY Right 1982   had extra bones in there; took them out   APPENDECTOMY  1975   BIOPSY  12/26/2018   Procedure: BIOPSY;  Surgeon: Golda Claudis PENNER, MD;  Location: [facility] ENDO SUITE;  Service: Endoscopy;;  duodenal biopsies   BIOPSY  03/23/2019   Procedure: BIOPSY;  Surgeon: Golda Claudis PENNER, MD;  Location: [facility] SU[facility];  Service: Endoscopy;;  esophagus   CARDIAC CATHETERIZATION N/A 03/28/2015   Procedure: Left Heart Cath and Coronary Angiography;  Surgeon: Gordy Bergamo, MD;  Location: [facility] INVASIVE CV LAB;  Service: Cardiovascular;  Laterality: N/A;   CARDIAC CATHETERIZATION N/A 03/28/2015   Procedure: Intravascular Pressure Wire/FFR Study;  Surgeon: Gordy Bergamo, MD;  Location: [facility] INVASIVE CV LAB;  Service: Cardiovascular;  Laterality: N/A;   CARPAL TUNNEL RELEASE Left ~ 2008   COLONOSCOPY WITH ESOPHAGOGASTRODUODENOSCOPY (EGD)     COLONOSCOPY WITH PROPOFOL  N/A 12/24/2019   Procedure: COLONOSCOPY WITH PROPOFOL ;  Surgeon: Golda Claudis PENNER, MD;  Location: [facility];  Service: Endoscopy;  Laterality: N/A;  955   ELBOW FRACTURE SURGERY Left ~ 2008   ESOPHAGEAL MANOMETRY N/A 09/16/2021   Procedure: ESOPHAGEAL MANOMETRY (EM);  Surgeon: San Sandor GAILS, DO;  Location: [facility] ENDOSCOPY;  Service: Endoscopy;  Laterality: N/A;   ESOPHAGOGASTRODUODENOSCOPY N/A 12/26/2018   Procedure: ESOPHAGOGASTRODUODENOSCOPY (EGD);  Surgeon: Golda Claudis PENNER, MD;  Location: [facility];  Service: Endoscopy;  Laterality: N/A;   ESOPHAGOGASTRODUODENOSCOPY (EGD) WITH PROPOFOL  N/A 03/23/2019   Procedure: ESOPHAGOGASTRODUODENOSCOPY (EGD) WITH PROPOFOL ;  Surgeon: Golda Claudis PENNER, MD;  Location: [facility];  Service: Endoscopy;  Laterality: N/A;  7:30   FRACTURE  SURGERY     ankle and elbow   PERIPHERAL VASCULAR BALLOON ANGIOPLASTY  06/20/2023   Procedure: PERIPHERAL VASCULAR BALLOON ANGIOPLASTY;  Surgeon: Pearline Norman RAMAN, MD;  Location: [facility] INVASIVE CV LAB;  Service: Cardiovascular;;   PH IMPEDANCE STUDY N/A 09/16/2021   Procedure: PH IMPEDANCE STUDY;  Surgeon: San Sandor GAILS, DO;  Location: [facility] ENDOSCOPY;  Service: Endoscopy;  Laterality: N/A;   PILONIDAL CYST EXCISION N/A 03/24/2017   Procedure: EXCISION CHRONIC  PILONIDAL ABSCESS;  Surgeon: Vernetta Berg, MD;  Location: [facility] ORS;  Service: General;  Laterality: N/A;   SCROTAL EXPLORATION N/A 11/13/2020   Procedure: EXCISION OF SEBACEOUS CYSTS, SCROTUM;  Surgeon: Sherrilee Belvie CROME, MD;  Location: [facility] ORS;  Service: Urology;  Laterality: N/A;   TENDON REPAIR Left ~ 2004   main tendon in my ankle    Social History:  reports that he has been smoking cigarettes. He has a 34 pack-year smoking  history. He has never been exposed to tobacco smoke. He has never used smokeless tobacco. He reports that he does not drink alcohol and does not use drugs. Family History:  Family History  Problem Relation Age of Onset   Diabetes Mother    Hypertension Mother    Cancer Mother    Throat cancer Mother    Diabetes Father    Hypertension Father    Hyperlipidemia Father    Congestive Heart Failure Sister    Colon cancer Neg Hx    Pancreatic cancer Neg Hx    Liver disease Neg Hx      HOME MEDICATIONS: Allergies as of 02/27/2024       Reactions   Bee Venom Swelling   Morphine  Itching, Nausea And Vomiting   IV site   Nexium [esomeprazole] Swelling   Face swells, no breathing impairment   Prilosec Otc [omeprazole Magnesium ] Swelling   Face swells, no breathing impairment        Medication List        Accurate as of February 27, 2024  7:14 AM. If you have any questions, ask your nurse or doctor.          acetaminophen  500 MG tablet Commonly known as: TYLENOL  Take 1,000 mg by mouth every  6 (six) hours as needed for headache.   amLODipine  10 MG tablet Commonly known as: NORVASC  Take 1 tablet (10 mg total) by mouth at bedtime.   atorvastatin  80 MG tablet Commonly known as: LIPITOR  Take 1 tablet (80 mg total) by mouth at bedtime.   ezetimibe  10 MG tablet Commonly known as: ZETIA  Take 1 tablet (10 mg total) by mouth daily.   HYDROcodone -acetaminophen  5-325 MG tablet Commonly known as: NORCO/VICODIN Take 1 tablet by mouth every 6 (six) hours as needed for moderate pain (pain score 4-6).   Insulin  Aspart FlexPen 100 UNIT/ML Commonly known as: NOVOLOG  Inject 0-5 Units into the skin 3 (three) times daily as needed (high blood sugar). Sliding scale   Lantus  SoloStar 100 UNIT/ML Solostar Pen Generic drug: insulin  glargine Inject 12 Units into the skin daily.   midodrine  5 MG tablet Commonly known as: PROAMATINE  Take 1 tablet (5 mg total) by mouth 3 (three) times daily with meals.   OLANZapine  10 MG tablet Commonly known as: ZyPREXA  Take 1 tablet (10 mg total) by mouth at bedtime.   sertraline  100 MG tablet Commonly known as: ZOLOFT  Take 2 tablets (200 mg total) by mouth at bedtime.         ALLERGIES: Allergies  Allergen Reactions   Bee Venom Swelling   Morphine  Itching and Nausea And Vomiting    IV site   Nexium [Esomeprazole] Swelling    Face swells, no breathing impairment   Prilosec Otc [Omeprazole Magnesium ] Swelling    Face swells, no breathing impairment     REVIEW OF SYSTEMS: A comprehensive ROS was conducted with the patient and is negative except as per HPI     OBJECTIVE:   VITAL SIGNS: There were no vitals taken for this visit.   PHYSICAL EXAM:  General: Pt appears well and is in NAD  Lungs: Clear with good BS bilat   Heart: RRR   Extremities:  Lower extremities - No pretibial edema.  Neuro: MS is good with appropriate affect, pt is alert and Ox3   DM Foot exam 11/01/2023  External examination of the feet shows left 4th and 5th  toe amputations The pedal pulses are 1+ on right and 1+ on  left. The sensation is absent on the left and decreased on the right to a screening 5.07, 10 gram monofilament    DATA REVIEWED:  Lab Results  Component Value Date   HGBA1C 8.6 (H) 02/04/2024   HGBA1C 7.3 (A) 10/27/2023   HGBA1C 8.7 (A) 07/12/2023    Latest Reference Range & Units 02/23/24 14:51  Sodium 135 - 145 mmol/L 137  Potassium 3.5 - 5.1 mmol/L 4.1  Chloride 98 - 111 mmol/L 99  CO2 22 - 32 mmol/L 24  Glucose 70 - 99 mg/dL 773 (H)  BUN 6 - 20 mg/dL 39 (H)  Creatinine 9.38 - 1.24 mg/dL 8.25 (H)  Calcium  8.9 - 10.3 mg/dL 8.0 (L)  Anion gap 5 - 15  14  Alkaline Phosphatase 38 - 126 U/L 100  Albumin  3.5 - 5.0 g/dL 2.8 (L)  AST 15 - 41 U/L 10 (L)  ALT 0 - 44 U/L 8  Total Protein 6.5 - 8.1 g/dL 5.7 (L)  Total Bilirubin 0.0 - 1.2 mg/dL 0.9  GFR, Estimated >39 mL/min 46 (L)     ASSESSMENT / PLAN / RECOMMENDATIONS:   1) Type 2 Diabetes Mellitus, Optimally  controlled, With CKD III, neuropathic and macrovascular complications - Most recent A1c of 7.3%. Goal A1c < 7.0 %.    -I have reviewed his freestyle libre since his last visit here, historically he has had hypoglycemia overnight, but he has not had any hypoglycemic episodes since his last visit 10/27/2023 -He continues with postprandial hyperglycemia, will adjust NovoLog  as below -Forms for diabetic shoes were re-signed again - Patient with history of DKA, not a candidate for SGLT2 inhibitors -Patient would like to avoid further weight loss, so will avoid GLP-1 agonist at this time -Metformin  was discontinued due to low GFR -Insulin  pump was not covered by his insurance  MEDICATIONS: Continue Lantus  12 units once daily Increase NovoLog  8 units 3 times daily before every meal Continue CF: NovoLog  (BG-130/50) TIDQAC   EDUCATION / INSTRUCTIONS: BG monitoring instructions: Patient is instructed to check his blood sugars 3 times a day. Call [facility] if: BG persistently < 70  I reviewed the Rule of 15 for the treatment of hypoglycemia in detail with the patient. Literature supplied.   2) Diabetic complications:  Eye: Does not have known diabetic retinopathy.  Neuro/ Feet: Does  have known diabetic peripheral neuropathy. Renal: Patient does not have known baseline CKD. He is not on an ACEI/ARB at present.   Signed electronically by: Stefano Redgie Butts, MD  [facility]  [facility] [address] [place], [place] [address] Phone: [phone] FAX: [phone]   CC: Darra Glendia RIGGERS [address] [place] [address] Phone: [phone]  Fax: [phone]    Return to Endocrinology clinic as below: Future Appointments  Date Time Provider Department Center  02/27/2024 10:10 AM Almira [place], Donell Redgie, MD [facility] None  03/29/2024  8:30 AM Harden Jerona GAILS, MD [facility] None

## 2024-03-08 ENCOUNTER — Ambulatory Visit: Payer: MEDICAID | Admitting: Internal Medicine

## 2024-03-08 ENCOUNTER — Encounter: Payer: Self-pay | Admitting: Internal Medicine

## 2024-03-08 ENCOUNTER — Other Ambulatory Visit: Payer: MEDICAID

## 2024-03-08 VITALS — BP 138/80 | HR 107 | Ht 68.0 in | Wt 136.4 lb

## 2024-03-08 DIAGNOSIS — E1142 Type 2 diabetes mellitus with diabetic polyneuropathy: Secondary | ICD-10-CM | POA: Diagnosis not present

## 2024-03-08 DIAGNOSIS — E1165 Type 2 diabetes mellitus with hyperglycemia: Secondary | ICD-10-CM

## 2024-03-08 DIAGNOSIS — E1122 Type 2 diabetes mellitus with diabetic chronic kidney disease: Secondary | ICD-10-CM | POA: Diagnosis not present

## 2024-03-08 DIAGNOSIS — Z794 Long term (current) use of insulin: Secondary | ICD-10-CM | POA: Diagnosis not present

## 2024-03-08 DIAGNOSIS — E1159 Type 2 diabetes mellitus with other circulatory complications: Secondary | ICD-10-CM | POA: Diagnosis not present

## 2024-03-08 DIAGNOSIS — N1832 Chronic kidney disease, stage 3b: Secondary | ICD-10-CM

## 2024-03-08 NOTE — Progress Notes (Unsigned)
 Name: [name] [name]  MRN/ DOB: [id number], December 09, 1970   Age/ Sex: 53 y.o., male    PCP: [name], PA-C   Reason for Endocrinology Evaluation: Type 2 Diabetes Mellitus     Date of Initial Endocrinology Visit: 04/11/2023    PATIENT IDENTIFIER: [name] [name] is a 53 y.o. male with a past medical history of DM, HTN, CAD, COPD, OSA. The patient presented for initial endocrinology clinic visit on 04/11/2023 for consultative assistance with his diabetes management.    HPI: [name] [name]    Diagnosed with DM 2017                Hemoglobin A1c has ranged from 7.8% in 2017, peaking at 15.5% in 2022.  Patient with history of DKA   On his initial visit to our clinic he had an A1c of 9.0%, patient was on NovoLog  Mix, metformin , and I switched him to Lantus /NovoLog   Metformin  discontinued during hospitalization 06/2023  I had prescribed OmniPod 07/2023 and referred him to our CDE for training, but per patient this would not be covered by his insurance  SUBJECTIVE:   During the last visit (11/01/2023): A1c 7.3%  Today (03/08/24): [name] [name] is here for follow-up on diabetes management. He checks his blood sugars both times daily. The patient has not had hypoglycemic episodes since the last clinic visit.    Patient continues to follow-up with Dr. Harden for history of left fifth toe amputation that occurred 06/2023, he is s/p left fourth toe amputation 07/2023  Patient follows with pulmonary for COPD He did present to the ED with syncope and right shoulder pain, workup unrevealing, and he was advised to follow-up with cardiology.   He was also noted with right shoulder humeral neck fracture in September, 2025 Patient presented to the ED again with depression and suicidal ideation on 02/23/2024 Continues with right shoulder pain  Saw psychiatry with recent medication changes. No nausea or vomiting  Has chronic constipation  He is on Pedialyte    HOME DIABETES REGIMEN: Lantus  12  units daily Novolog  8 units TIDQAC CF: NovoLog  (BG -130/45) TIDQAC   Statin: yes ACE-I/ARB: no   CONTINUOUS GLUCOSE MONITORING RECORD INTERPRETATION    Dates of Recording: 9/26-10/01/2024  Sensor description:freestyle libre 3  Results statistics:   CGM use % of time 63  Average and SD 190/30.7  Time in range 46%  % Time Above 180 38  % Time above 250 16  % Time Below target 0   Glycemic patterns summary: BGs trend down to optimal overnight and increased throughout the day Hyperglycemic episodes postprandial  Hypoglycemic episodes occurred N/A in the past 2 weeks  Overnight periods:  optimal   DIABETIC COMPLICATIONS: Microvascular complications:  Neuropathy, s/p left fifth toe amputation Denies: CKD  Last eye exam: Completed 2 yrs   Macrovascular complications:  CAD Denies:  PVD, CVA   PAST HISTORY: Past Medical History:  Past Medical History:  Diagnosis Date   Anxiety    Arthritis    Asthma    CAD (coronary artery disease)    a. 03/2015 Cath: LM nl, LAD mild diff dzs throughout w/ 50p/d, LCX diff dzs, 69m, RCA diff dzs, 35m-->Med rx; b. 12/2020 MV: EF 57%, small/mild apical inferior defect w/ partial rev/mild ischemia.   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, bilateral    Sleep apnea 2016   does not use CPAP   Type 2 diabetes mellitus (HCC)    Past Surgical History:  Past Surgical History:  Procedure Laterality Date   ABDOMINAL AORTOGRAM W/LOWER EXTREMITY N/A 06/20/2023   Procedure: ABDOMINAL AORTOGRAM W/LOWER EXTREMITY;  Surgeon: Pearline Norman RAMAN, MD;  Location: [facility] INVASIVE CV LAB;  Service: Cardiovascular;   Laterality: N/A;   AMPUTATION Left 06/24/2023   Procedure: LEFT 5TH TOE AMPUTATION;  Surgeon: Harden Jerona GAILS, MD;  Location[facility] OR;  Service: Orthopedics;  Laterality: Left;   AMPUTATION Left 07/20/2023   Procedure: LEFT FOOT 4TH AND 5TH RAY AMPUTATION;  Surgeon: Harden Jerona GAILS, MD;  Location: [facility] OR;  Service: Orthopedics;  Laterality: Left;   ANKLE SURGERY Right 1982   had extra bones in there; took them out   APPENDECTOMY  1975   BIOPSY  12/26/2018   Procedure: BIOPSY;  Surgeon: Golda Claudis PENNER, MD;  Location: [facility] SUITE;  Service: Endoscopy;;  duodenal biopsies   BIOPSY  03/23/2019   Procedure: BIOPSY;  Surgeon: Golda Claudis PENNER, MD;  Location: [facility];  Service: Endoscopy;;  esophagus   CARDIAC CATHETERIZATION N/A 03/28/2015   Procedure: Left Heart Cath and Coronary Angiography;  Surgeon: Gordy Bergamo, MD;  Location: [facility] INVASIVE CV LAB;  Service: Cardiovascular;  Laterality: N/A;   CARDIAC CATHETERIZATION N/A 03/28/2015   Procedure: Intravascular Pressure Wire/FFR Study;  Surgeon: Gordy Bergamo, MD;  Location: [facility] INVASIVE CV LAB;  Service: Cardiovascular;  Laterality: N/A;   CARPAL TUNNEL RELEASE Left ~ 2008   COLONOSCOPY WITH ESOPHAGOGASTRODUODENOSCOPY (EGD)     COLONOSCOPY WITH PROPOFOL  N/A 12/24/2019   Procedure: COLONOSCOPY WITH PROPOFOL ;  Surgeon: Golda Claudis PENNER, MD;  Location: [facility];  Service: Endoscopy;  Laterality: N/A;  955   ELBOW FRACTURE SURGERY Left ~ 2008   ESOPHAGEAL MANOMETRY N/A 09/16/2021   Procedure: ESOPHAGEAL MANOMETRY (EM);  Surgeon: San Sandor GAILS, DO;  Location: [facility] ENDOSCOPY;  Service: Endoscopy;  Laterality: N/A;   ESOPHAGOGASTRODUODENOSCOPY N/A 12/26/2018   Procedure: ESOPHAGOGASTRODUODENOSCOPY (EGD);  Surgeon: Golda Claudis PENNER, MD;  Location: [facility];  Service: Endoscopy;  Laterality: N/A;   ESOPHAGOGASTRODUODENOSCOPY (EGD) WITH PROPOFOL  N/A 03/23/2019   Procedure: ESOPHAGOGASTRODUODENOSCOPY (EGD) WITH PROPOFOL ;  Surgeon: Golda Claudis PENNER,  MD;  Location: [facility];  Service: Endoscopy;  Laterality: N/A;  7:30   FRACTURE SURGERY     ankle and elbow   PERIPHERAL VASCULAR BALLOON ANGIOPLASTY  06/20/2023   Procedure: PERIPHERAL VASCULAR BALLOON ANGIOPLASTY;  Surgeon: Pearline Norman RAMAN, MD;  Location: [facility] INVASIVE CV LAB;  Service: Cardiovascular;;   PH IMPEDANCE STUDY N/A 09/16/2021   Procedure: PH IMPEDANCE STUDY;  Surgeon: San Sandor GAILS, DO;  Location: [facility] ENDOSCOPY;  Service: Endoscopy;  Laterality: N/A;   PILONIDAL CYST EXCISION N/A 03/24/2017   Procedure: EXCISION CHRONIC  PILONIDAL ABSCESS;  Surgeon: Vernetta Berg, MD;  Location: [facility] ORS;  Service: General;  Laterality: N/A;   SCROTAL EXPLORATION N/A 11/13/2020   Procedure: EXCISION OF SEBACEOUS CYSTS, SCROTUM;  Surgeon: Sherrilee Belvie CROME, MD;  Location: [facility] ORS;  Service: Urology;  Laterality: N/A;   TENDON REPAIR Left ~ 2004   main tendon in my ankle    Social  History:  reports that he has been smoking cigarettes. He has a 34 pack-year smoking history. He has never been exposed to tobacco smoke. He has never used smokeless tobacco. He reports that he does not drink alcohol and does not use drugs. Family History:  Family History  Problem Relation Age of Onset   Diabetes Mother    Hypertension Mother    Cancer Mother    Throat cancer Mother    Diabetes Father    Hypertension Father    Hyperlipidemia Father    Congestive Heart Failure Sister    Colon cancer Neg Hx    Pancreatic cancer Neg Hx    Liver disease Neg Hx      HOME MEDICATIONS: Allergies as of 03/08/2024       Reactions   Bee Venom Swelling   Morphine  Itching, Nausea And Vomiting   IV site   Nexium [esomeprazole] Swelling   Face swells, no breathing impairment   Prilosec Otc [omeprazole Magnesium ] Swelling   Face swells, no breathing impairment        Medication List        Accurate as of March 08, 2024  9:18 AM. If you have any questions, ask your nurse or doctor.           acetaminophen  500 MG tablet Commonly known as: TYLENOL  Take 1,000 mg by mouth every 6 (six) hours as needed for headache.   amLODipine  10 MG tablet Commonly known as: NORVASC  Take 1 tablet (10 mg total) by mouth at bedtime.   atorvastatin  80 MG tablet Commonly known as: LIPITOR  Take 1 tablet (80 mg total) by mouth at bedtime.   ezetimibe  10 MG tablet Commonly known as: ZETIA  Take 1 tablet (10 mg total) by mouth daily.   HYDROcodone -acetaminophen  5-325 MG tablet Commonly known as: NORCO/VICODIN Take 1 tablet by mouth every 6 (six) hours as needed for moderate pain (pain score 4-6).   Insulin  Aspart FlexPen 100 UNIT/ML Commonly known as: NOVOLOG  Inject 0-5 Units into the skin 3 (three) times daily as needed (high blood sugar). Sliding scale   Lantus  SoloStar 100 UNIT/ML Solostar Pen Generic drug: insulin  glargine Inject 12 Units into the skin daily.   OLANZapine  10 MG tablet Commonly known as: ZyPREXA  Take 1 tablet (10 mg total) by mouth at bedtime.   sertraline  100 MG tablet Commonly known as: ZOLOFT  Take 2 tablets (200 mg total) by mouth at bedtime.         ALLERGIES: Allergies  Allergen Reactions   Bee Venom Swelling   Morphine  Itching and Nausea And Vomiting    IV site   Nexium [Esomeprazole] Swelling    Face swells, no breathing impairment   Prilosec Otc [Omeprazole Magnesium ] Swelling    Face swells, no breathing impairment     REVIEW OF SYSTEMS: A comprehensive ROS was conducted with the patient and is negative except as per HPI     OBJECTIVE:   VITAL SIGNS: BP 138/80 (BP Location: Left Arm, Patient Position: Sitting, Cuff Size: Large)   Pulse (!) 107   Ht 5' 8 (1.727 m)   Wt 136 lb 6.4 oz (61.9 kg)   SpO2 99%   BMI 20.74 kg/m    PHYSICAL EXAM:  General: Pt appears well and is in NAD  Lungs: Clear with good BS bilat   Heart: RRR   Extremities:  Lower extremities - No pretibial edema.  Neuro: MS is good with appropriate affect, pt is  alert and Ox3   DM  Foot exam 11/01/2023  External examination of the feet shows left 4th and 5th toe amputations The pedal pulses are 1+ on right and 1+ on left. The sensation is absent on the left and decreased on the right to a screening 5.07, 10 gram monofilament    DATA REVIEWED:  Lab Results  Component Value Date   HGBA1C 8.6 (H) 02/04/2024   HGBA1C 7.3 (A) 10/27/2023   HGBA1C 8.7 (A) 07/12/2023    Latest Reference Range & Units 02/23/24 14:51  Sodium 135 - 145 mmol/L 137  Potassium 3.5 - 5.1 mmol/L 4.1  Chloride 98 - 111 mmol/L 99  CO2 22 - 32 mmol/L 24  Glucose 70 - 99 mg/dL 773 (H)  BUN 6 - 20 mg/dL 39 (H)  Creatinine 9.38 - 1.24 mg/dL 8.25 (H)  Calcium  8.9 - 10.3 mg/dL 8.0 (L)  Anion gap 5 - 15  14  Alkaline Phosphatase 38 - 126 U/L 100  Albumin  3.5 - 5.0 g/dL 2.8 (L)  AST 15 - 41 U/L 10 (L)  ALT 0 - 44 U/L 8  Total Protein 6.5 - 8.1 g/dL 5.7 (L)  Total Bilirubin 0.0 - 1.2 mg/dL 0.9  GFR, Estimated >39 mL/min 46 (L)     ASSESSMENT / PLAN / RECOMMENDATIONS:   1) Type 2 Diabetes Mellitus, Poorly  controlled, With CKD III, neuropathic and macrovascular complications - Most recent A1c of 8.6%. Goal A1c < 7.0 %.    -Hus A1c  increased from 7.3% to 8.6% -Barriers to diabetes self-care include depression -He has not been taking his prandial dose of insulin , which has resulted in severe hypoglycemia during the day >350 mg/dl.  His fasting BG's and overnight BG's are optimal - Patient with history of DKA, not a candidate for SGLT2 inhibitors -Patient would like to avoid further weight loss, so will avoid GLP-1 agonist at this time -Metformin  was discontinued due to low GFR -Insulin  pump was not covered by his insurance - The patient was also advised to take NovoLog  before each meal as well as snack  MEDICATIONS: Continue Lantus  12 units once daily Take NovoLog  8 units with each meal and 4 units with snacks  Continue CF: NovoLog  (BG-130/50) TIDQAC   EDUCATION /  INSTRUCTIONS: BG monitoring instructions: Patient is instructed to check his blood sugars 3 times a day. Call [facility] if: BG persistently < 70  I reviewed the Rule of 15 for the treatment of hypoglycemia in detail with the patient. Literature supplied.   2) Diabetic complications:  Eye: Does not have known diabetic retinopathy.  Neuro/ Feet: Does  have known diabetic peripheral neuropathy. Renal: Patient does not have known baseline CKD. He is not on an ACEI/ARB at present.   F/U in 4 months     Signed electronically by: Stefano Redgie Butts, MD  [facility]  [facility] [address] [place] [place], [place] [address] Phone: [phone] FAX: [phone]   CC: Darra Glendia RIGGERS [address] [place] [address] Phone: [phone]  Fax: [phone]    Return to Endocrinology clinic as below: Future Appointments  Date Time Provider Department Center  03/08/2024  9:30 AM Shaletha Humble, Donell Redgie, MD [facility] None  03/29/2024  8:30 AM Harden Jerona GAILS, MD [facility] None

## 2024-03-08 NOTE — Patient Instructions (Addendum)
 Continue Lantus  12 units once daily Take Novolog  8 units with each meal OR 4 units with a snack  Novolog  correctional insulin : Use the scale below to help guide you before each meal   Blood sugar before meal Number of units to inject  Less than 180 0 unit  181 - 230 1 units  231 - 280 2 units  281 - 330 3 units  331 - 380 4 units  381 - 430 5 units  431 - 480 6 units  481 - 530 7 units   HOW TO TREAT LOW BLOOD SUGARS (Blood sugar LESS THAN 70 MG/DL) Please follow the RULE OF 15 for the treatment of hypoglycemia treatment (when your (blood sugars are less than 70 mg/dL)   STEP 1: Take 15 grams of carbohydrates when your blood sugar is low, which includes:  3-4 GLUCOSE TABS  OR 3-4 OZ OF JUICE OR REGULAR SODA OR ONE TUBE OF GLUCOSE GEL    STEP 2: RECHECK blood sugar in 15 MINUTES STEP 3: If your blood sugar is still low at the 15 minute recheck --> then, go back to STEP 1 and treat AGAIN with another 15 grams of carbohydrates.

## 2024-03-09 ENCOUNTER — Ambulatory Visit: Payer: Self-pay | Admitting: Internal Medicine

## 2024-03-09 LAB — MICROALBUMIN / CREATININE URINE RATIO
Creatinine, Urine: 110 mg/dL (ref 20–320)
Microalb Creat Ratio: 1960 mg/g{creat} — ABNORMAL HIGH (ref <30)
Microalb, Ur: 215.6 mg/dL

## 2024-03-09 MED ORDER — LOSARTAN POTASSIUM 25 MG PO TABS: 25.0000 mg | ORAL_TABLET | Freq: Every day | ORAL | 3 refills | Status: DC

## 2024-03-09 NOTE — Telephone Encounter (Signed)
 Please let the patient know that he is leaking too much protein in the urine, this is a sign that the diabetes is starting to cause behind the seen damage in the kidney.   Most important thing is to make sure that he he brings his sugars down to normal as soon as possible   I also want him to start on Cozaar , 50 mg, 1 tablet a day, Cozaar  is to protect his kidneys from diabetes damage    Thanks

## 2024-03-13 ENCOUNTER — Telehealth: Payer: Self-pay

## 2024-03-13 NOTE — Telephone Encounter (Signed)
 Done

## 2024-03-13 NOTE — Telephone Encounter (Signed)
 Spoke with patient and advised of result and patient will pick up medication and start.

## 2024-03-14 ENCOUNTER — Other Ambulatory Visit: Payer: Self-pay | Admitting: Radiology

## 2024-03-14 ENCOUNTER — Encounter (INDEPENDENT_AMBULATORY_CARE_PROVIDER_SITE_OTHER): Payer: Self-pay | Admitting: Gastroenterology

## 2024-03-14 ENCOUNTER — Telehealth: Payer: Self-pay | Admitting: Orthopedic Surgery

## 2024-03-14 MED ORDER — HYDROCODONE-ACETAMINOPHEN 5-325 MG PO TABS: 1.0000 | ORAL_TABLET | Freq: Three times a day (TID) | ORAL | 0 refills | Status: DC | PRN

## 2024-03-14 NOTE — Telephone Encounter (Signed)
 Patient called and wants a refill on pain medication for his fracture shoulder. [phone]

## 2024-03-14 NOTE — Telephone Encounter (Signed)
 You saw pt for a humeral fx early September. He went to the ER for increased pain several days later. He has a f/u sch with Dr. Harden on 03/29/2024 and he is asking for a refill of his pain medication. Please advise.

## 2024-03-14 NOTE — Telephone Encounter (Signed)
 Patient asks for refill of pain medication, [facility].

## 2024-03-14 NOTE — Addendum Note (Signed)
 Addended by: Joandry Slagter R on: 03/14/2024 03:01 PM   Modules accepted: Orders

## 2024-03-22 ENCOUNTER — Other Ambulatory Visit (INDEPENDENT_AMBULATORY_CARE_PROVIDER_SITE_OTHER): Payer: MEDICAID

## 2024-03-22 ENCOUNTER — Ambulatory Visit: Payer: MEDICAID | Admitting: Orthopedic Surgery

## 2024-03-22 ENCOUNTER — Encounter: Payer: Self-pay | Admitting: Orthopedic Surgery

## 2024-03-22 DIAGNOSIS — S42211A Unspecified displaced fracture of surgical neck of right humerus, initial encounter for closed fracture: Secondary | ICD-10-CM | POA: Diagnosis not present

## 2024-03-22 DIAGNOSIS — M25511 Pain in right shoulder: Secondary | ICD-10-CM

## 2024-03-22 DIAGNOSIS — G8929 Other chronic pain: Secondary | ICD-10-CM

## 2024-03-22 NOTE — Progress Notes (Signed)
 Office Visit Note   Patient: [name] [name]           Date of Birth: [birth year]           MRN: [id number] Visit Date: 03/22/2024              Requested by: [name], PA-C [address] [place],  [place] [address] PCP: [name], PA-C  Chief Complaint  Patient presents with   Right Shoulder - Pain      HPI: Discussed the use of AI scribe software for clinical note transcription with the patient, who gave verbal consent to proceed.  History of Present Illness [name] [name] is a 53 year old male who presents with persistent shoulder pain and limited motion due to a right humeral neck fracture.  He has been experiencing persistent pain in the right shoulder following a fall on September 6th, during which he lost consciousness and sustained a direct impact on the shoulder. The pain has persisted for approximately six weeks.  Radiographs indicate an old neck fracture of the right proximal humerus.  He is a daily smoker, consuming one pack per day for over thirty-four years, which may impact healing and surgical outcomes.  He has uncontrolled type 1 diabetes with an A1c of 8, which fluctuates.     Assessment & Plan: Visit Diagnoses:  1. Chronic right shoulder pain   2. Closed displaced fracture of surgical neck of right humerus, unspecified fracture morphology, initial encounter     Plan: Assessment and Plan Assessment & Plan Right proximal humerus fracture, slow to heal Fracture stable but slow to heal. Radiographs show no significant healing. Daily smoking and uncontrolled type 1 diabetes (A1c 8) may contribute to delayed healing and increased risk of complications. - Refer to Dr. Addie for evaluation of surgical intervention for the right shoulder. - Advise smoking cessation to reduce risk of infection and surgical complications. - Recommend better control of diabetes to prevent complications.      Follow-Up Instructions: Return if symptoms worsen or fail to  improve.   Ortho Exam  Patient is alert, oriented, no adenopathy, well-dressed, normal affect, normal respiratory effort. Physical Exam MUSCULOSKELETAL: No active or passive range of motion in right shoulder due to humeral neck fracture.      Imaging: XR Shoulder Right Result Date: 03/22/2024 I radiographs of the right shoulder show surgical neck fracture of the right proximal humerus.  It was  No images are attached to the encounter.  Labs: Lab Results  Component Value Date   HGBA1C 8.6 (H) 02/04/2024   HGBA1C 7.3 (A) 10/27/2023   HGBA1C 8.7 (A) 07/12/2023   ESRSEDRATE 20 (H) 06/18/2022   ESRSEDRATE 1 08/20/2015   CRP <0.5 06/18/2022   CRP 2.5 01/23/2019   CRP <0.5 08/20/2015   REPTSTATUS 06/18/2023 FINAL 06/13/2023   CULT  06/13/2023    NO GROWTH 5 DAYS Performed at [facility], [address] [place], [place] [address]      Lab Results  Component Value Date   ALBUMIN  2.8 (L) 02/23/2024   ALBUMIN  2.6 (L) 02/16/2024   ALBUMIN  3.0 (L) 02/04/2024    Lab Results  Component Value Date   MG 1.8 02/08/2023   MG 2.0 08/29/2022   MG 1.5 (L) 08/28/2022   No results found for: VD25OH  No results found for: PREALBUMIN    Latest Ref Rng & Units 02/23/2024    2:51 PM 02/16/2024    4:41 PM 02/06/2024  5:01 AM  CBC EXTENDED  WBC 4.0 - 10.5 K/uL 12.0  11.0  9.2   RBC 4.22 - 5.81 MIL/uL 3.59  3.52  3.37   Hemoglobin 13.0 - 17.0 g/dL 88.8  88.9  89.6   HCT 39.0 - 52.0 % 33.5  33.1  31.5   Platelets 150 - 400 K/uL 295  265  178   NEUT# 1.7 - 7.7 K/uL  8.7    Lymph# 0.7 - 4.0 K/uL  1.5       There is no height or weight on file to calculate BMI.  Orders:  Orders Placed This Encounter  Procedures   XR Shoulder Right   No orders of the defined types were placed in this encounter.    Procedures: No procedures performed  Clinical Data: No additional findings.  ROS:  All other systems negative, except as noted in the HPI. Review of  Systems  Objective: Vital Signs: There were no vitals taken for this visit.  Specialty Comments:  No specialty comments available.  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 07/12/2023   Gangrene of toe of left foot (HCC) 06/24/2023   Diabetes mellitus (HCC) 04/11/2023   PTSD (post-traumatic stress disorder) 03/14/2023   Aortic valve sclerosis 09/16/2022   COPD GOLD 1/ AB 06/11/2022   Syncope and collapse 12/13/2021   Bifascicular block 12/13/2021   Renal insufficiency 12/13/2021   Esophageal motility disorder 12/13/2021   Scrotal abscess 11/19/2020   Barrett's esophagus 01/23/2019   MDD (major depressive disorder), severe (HCC) 12/17/2018   Uncontrolled type 2 diabetes mellitus with hyperglycemia (HCC) 03/22/2018   ARF (acute renal failure) 05/30/2017   Mixed hyperlipidemia 03/31/2017   Diabetic polyneuropathy (HCC) 03/31/2017   Vitamin B12 deficiency 03/31/2017   Vitamin D  deficiency 03/31/2017   Right bundle branch block 02/14/2017   Gastroesophageal reflux disease 10/15/2016   Benign hypertension 10/15/2016   Leukocytosis 11/25/2015   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 03/2015 Cath: LM nl, LAD mild diff dzs throughout w/ 50p/d, LCX diff dzs, 27m, RCA diff dzs, 32m-->Med rx; b. 12/2020 MV: EF 57%, small/mild apical inferior defect w/ partial rev/mild ischemia.   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, bilateral    Sleep apnea 2016   does not use CPAP   Type 2 diabetes mellitus (HCC)     Family History  Problem Relation Age of Onset   Diabetes Mother    Hypertension Mother    Cancer Mother    Throat cancer Mother  Diabetes Father    Hypertension Father    Hyperlipidemia Father    Congestive Heart Failure Sister    Colon cancer Neg Hx    Pancreatic cancer Neg Hx    Liver disease Neg Hx     Past Surgical History:  Procedure Laterality Date   ABDOMINAL AORTOGRAM W/LOWER EXTREMITY N/A 06/20/2023   Procedure: ABDOMINAL AORTOGRAM W/LOWER EXTREMITY;  Surgeon: Pearline Norman RAMAN, MD;  Location: [facility] INVASIVE CV LAB;  Service: Cardiovascular;  Laterality: N/A;   AMPUTATION Left 06/24/2023   Procedure: LEFT 5TH TOE AMPUTATION;  Surgeon: Harden Jerona GAILS, MD;  Location: [facility] OR;  Service: Orthopedics;  Laterality: Left;   AMPUTATION Left 07/20/2023   Procedure: LEFT FOOT 4TH AND 5TH RAY AMPUTATION;  Surgeon: Harden Jerona GAILS, MD;  Location: [facility] OR;  Service: Orthopedics;  Laterality: Left;   ANKLE SURGERY Right 1982   had extra bones in there; took them out   APPENDECTOMY  1975   BIOPSY  12/26/2018   Procedure: BIOPSY;  Surgeon: Golda Claudis PENNER, MD;  Location: [facility];  Service: Endoscopy;;  duodenal biopsies   BIOPSY  03/23/2019   Procedure: BIOPSY;  Surgeon: Golda Claudis PENNER, MD;  [facility]: [facility];  Service: Endoscopy;;  esophagus   CARDIAC CATHETERIZATION N/A 03/28/2015   Procedure: Left Heart Cath and Coronary Angiography;  Surgeon: Gordy Bergamo, MD;  Location: [facility] INVASIVE CV LAB;  Service: Cardiovascular;  Laterality: N/A;   CARDIAC CATHETERIZATION N/A  03/28/2015   Procedure: Intravascular Pressure Wire/FFR Study;  Surgeon: Gordy Bergamo, MD;  Location: [facility] INVASIVE CV LAB;  Service: Cardiovascular;  Laterality: N/A;   CARPAL TUNNEL RELEASE Left ~ 2008   COLONOSCOPY WITH ESOPHAGOGASTRODUODENOSCOPY (EGD)     COLONOSCOPY WITH PROPOFOL  N/A 12/24/2019   Procedure: COLONOSCOPY WITH PROPOFOL ;  Surgeon: Golda Claudis PENNER, MD;  Location: [facility];  Service: Endoscopy;  Laterality: N/A;  955   ELBOW FRACTURE SURGERY Left ~ 2008   ESOPHAGEAL MANOMETRY N/A 09/16/2021   Procedure: ESOPHAGEAL MANOMETRY (EM);  Surgeon: San Sandor GAILS, DO;  Location: [facility];  Service: Endoscopy;  Laterality: N/A;   ESOPHAGOGASTRODUODENOSCOPY N/A 12/26/2018   Procedure: ESOPHAGOGASTRODUODENOSCOPY (EGD);  Surgeon: Golda Claudis PENNER, MD;  [facility];  Service: Endoscopy;  Laterality: N/A;   ESOPHAGOGASTRODUODENOSCOPY (EGD) WITH PROPOFOL  N/A 03/23/2019   Procedure: ESOPHAGOGASTRODUODENOSCOPY (EGD) WITH PROPOFOL ;  Surgeon: Golda Claudis PENNER, MD;  [facility]: [facility];  Service: Endoscopy;  Laterality: N/A;  7:30   FRACTURE SURGERY     ankle and elbow   PERIPHERAL VASCULAR BALLOON ANGIOPLASTY  06/20/2023   Procedure: PERIPHERAL VASCULAR BALLOON ANGIOPLASTY;  Surgeon: Pearline Norman RAMAN, MD;  Location: [facility] INVASIVE CV LAB;  Service: Cardiovascular;;   PH IMPEDANCE STUDY N/A 09/16/2021   Procedure: PH IMPEDANCE STUDY;  Surgeon: San Sandor GAILS, DO;  Location: [facility];  Service: Endoscopy;  Laterality: N/A;   PILONIDAL CYST EXCISION N/A 03/24/2017   Procedure: EXCISION CHRONIC  PILONIDAL ABSCESS;  Surgeon: Vernetta Berg, MD;  Location: [facility] ORS;  Service: General;  Laterality: N/A;   SCROTAL EXPLORATION N/A 11/13/2020   Procedure: EXCISION OF SEBACEOUS CYSTS, SCROTUM;  Surgeon: Sherrilee Belvie CROME, MD;  Location: [facility];  Service: Urology;  Laterality: N/A;   TENDON REPAIR Left ~ 2004   main tendon in my ankle   Social History   Occupational History    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

## 2024-03-29 ENCOUNTER — Ambulatory Visit: Payer: MEDICAID | Admitting: Orthopedic Surgery

## 2024-03-29 DIAGNOSIS — S98132A Complete traumatic amputation of one left lesser toe, initial encounter: Secondary | ICD-10-CM

## 2024-03-29 DIAGNOSIS — Z89422 Acquired absence of other left toe(s): Secondary | ICD-10-CM

## 2024-03-29 DIAGNOSIS — M6702 Short Achilles tendon (acquired), left ankle: Secondary | ICD-10-CM

## 2024-03-30 ENCOUNTER — Encounter: Payer: Self-pay | Admitting: Orthopedic Surgery

## 2024-03-30 NOTE — Progress Notes (Signed)
 Office Visit Note   Patient: [name] [name]           Date of Birth: 10-19-1970           MRN: [id number] Visit Date: 03/29/2024              Requested by: [name], PA-C [address] [place],  [place] [address] PCP: [name], PA-C  Chief Complaint  Patient presents with   Left Foot - Follow-up    Hx left 5th ray amputation      HPI: Discussed the use of AI scribe software for clinical note transcription with the patient, who gave verbal consent to proceed.  History of Present Illness [name] [name] is a 53 year old male who presents for follow-up of a well-healed foot amputation and tight Achilles tendon.  He is experiencing tightness in the Achilles tendon, particularly with dorsiflexion to neutral. His routine includes sitting and wiggling his leg back and forth, as well as stretching by placing his foot behind him with his knee straight and leaning forward.     Assessment & Plan: Visit Diagnoses: No diagnosis found.  Plan: Assessment and Plan Assessment & Plan Tight Achilles tendon Achilles tendon remains tight with dorsiflexion to neutral. - Advised stretching exercises for Achilles tendon, including sitting and wiggling leg, and placing foot behind with knees straight and leaning forward.      Follow-Up Instructions: No follow-ups on file.   Ortho Exam  Patient is alert, oriented, no adenopathy, well-dressed, normal affect, normal respiratory effort. Physical Exam EXTREMITIES: No ulcers on the plantar surface of the foot. Amputation site well healed. Dorsalis pedis pulse present.      Imaging: No results found. No images are attached to the encounter.  Labs: Lab Results  Component Value Date   HGBA1C 8.6 (H) 02/04/2024   HGBA1C 7.3 (A) 10/27/2023   HGBA1C 8.7 (A) 07/12/2023   ESRSEDRATE 20 (H) 06/18/2022   ESRSEDRATE 1 08/20/2015   CRP <0.5 06/18/2022   CRP 2.5 01/23/2019   CRP <0.5 08/20/2015   REPTSTATUS 06/18/2023 FINAL  06/13/2023   CULT  06/13/2023    NO GROWTH 5 DAYS Performed at [facility], [address], [place], [place] [address]      Lab Results  Component Value Date   ALBUMIN  2.8 (L) 02/23/2024   ALBUMIN  2.6 (L) 02/16/2024   ALBUMIN  3.0 (L) 02/04/2024    Lab Results  Component Value Date   MG 1.8 02/08/2023   MG 2.0 08/29/2022   MG 1.5 (L) 08/28/2022   No results found for: VD25OH  No results found for: PREALBUMIN    Latest Ref Rng & Units 02/23/2024    2:51 PM 02/16/2024    4:41 PM 02/06/2024    5:01 AM  CBC EXTENDED  WBC 4.0 - 10.5 K/uL 12.0  11.0  9.2   RBC 4.22 - 5.81 MIL/uL 3.59  3.52  3.37   Hemoglobin 13.0 - 17.0 g/dL 88.8  88.9  89.6   HCT 39.0 - 52.0 % 33.5  33.1  31.5   Platelets 150 - 400 K/uL 295  265  178   NEUT# 1.7 - 7.7 K/uL  8.7    Lymph# 0.7 - 4.0 K/uL  1.5       There is no height or weight on file to calculate BMI.  Orders:  No orders of the defined types were placed in this encounter.  No orders of the defined types were placed  in this encounter.    Procedures: No procedures performed  Clinical Data: No additional findings.  ROS:  All other systems negative, except as noted in the HPI. Review of Systems  Objective: Vital Signs: There were no vitals taken for this visit.  Specialty Comments:  No specialty comments available.  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 07/12/2023   Gangrene of toe of left foot (HCC) 06/24/2023   Diabetes mellitus (HCC) 04/11/2023   PTSD (post-traumatic stress disorder) 03/14/2023   Aortic valve sclerosis 09/16/2022   COPD GOLD 1/ AB 06/11/2022   Syncope and collapse 12/13/2021   Bifascicular block 12/13/2021   Renal insufficiency 12/13/2021    Esophageal motility disorder 12/13/2021   Scrotal abscess 11/19/2020   Barrett's esophagus 01/23/2019   MDD (major depressive disorder), severe (HCC) 12/17/2018   Uncontrolled type 2 diabetes mellitus with hyperglycemia (HCC) 03/22/2018   ARF (acute renal failure) 05/30/2017   Mixed hyperlipidemia 03/31/2017   Diabetic polyneuropathy (HCC) 03/31/2017   Vitamin B12 deficiency 03/31/2017   Vitamin D  deficiency 03/31/2017   Right bundle branch block 02/14/2017   Gastroesophageal reflux disease 10/15/2016   Benign hypertension 10/15/2016   Leukocytosis 11/25/2015   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 03/2015 Cath: LM nl, LAD mild diff dzs throughout w/ 50p/d, LCX diff dzs, 65m, RCA diff dzs, 63m-->Med rx; b. 12/2020 MV: EF 57%, small/mild apical inferior defect w/ partial rev/mild ischemia.   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, bilateral    Sleep apnea 2016   does not use CPAP   Type 2 diabetes mellitus (HCC)     Family History  Problem Relation Age of Onset   Diabetes Mother    Hypertension Mother    Cancer Mother    Throat cancer Mother    Diabetes Father    Hypertension Father    Hyperlipidemia Father    Congestive Heart Failure Sister    Colon cancer Neg Hx    Pancreatic cancer Neg Hx    Liver disease Neg Hx      Past Surgical History:  Procedure Laterality Date   ABDOMINAL AORTOGRAM W/LOWER EXTREMITY N/A 06/20/2023   Procedure: ABDOMINAL AORTOGRAM W/LOWER EXTREMITY;  Surgeon: Pearline Norman RAMAN, MD;  Location: [facility] INVASIVE CV LAB;  Service: Cardiovascular;  Laterality: N/A;   AMPUTATION Left 06/24/2023   Procedure: LEFT 5TH TOE AMPUTATION;  Surgeon: Harden Jerona GAILS, MD;  Location: [facility] OR;  Service: Orthopedics;  Laterality: Left;   AMPUTATION Left 07/20/2023   Procedure: LEFT  FOOT 4TH AND 5TH RAY AMPUTATION;  Surgeon: Harden Jerona GAILS, MD;  Location: [facility] OR;  Service: Orthopedics;  Laterality: Left;   ANKLE SURGERY Right 1982   had extra bones in there; took them out   APPENDECTOMY  1975   BIOPSY  12/26/2018   Procedure: BIOPSY;  Surgeon: Golda Claudis PENNER, MD;  Location: [facility];  Service: Endoscopy;;  duodenal biopsies   BIOPSY  03/23/2019   Procedure: BIOPSY;  Surgeon: Golda Claudis PENNER, MD;  Location: [facility];  Service: Endoscopy;;  esophagus   CARDIAC CATHETERIZATION N/A 03/28/2015   Procedure: Left Heart Cath and Coronary Angiography;  Surgeon: Gordy Bergamo, MD;  Location: [facility] INVASIVE CV LAB;  Service: Cardiovascular;  Laterality: N/A;   CARDIAC CATHETERIZATION N/A 03/28/2015   Procedure: Intravascular Pressure Wire/FFR Study;  Surgeon: Gordy Bergamo, MD;  Location: [facility] INVASIVE CV LAB;  Service: Cardiovascular;  Laterality: N/A;   CARPAL TUNNEL RELEASE Left ~ 2008   COLONOSCOPY WITH ESOPHAGOGASTRODUODENOSCOPY (EGD)     COLONOSCOPY WITH PROPOFOL  N/A 12/24/2019   Procedure: COLONOSCOPY WITH PROPOFOL ;  Surgeon: Golda Claudis PENNER, MD;  Location: [facility];  Service: Endoscopy;  Laterality: N/A;  955   ELBOW FRACTURE SURGERY Left ~ 2008   ESOPHAGEAL MANOMETRY N/A 09/16/2021   Procedure: ESOPHAGEAL MANOMETRY (EM);  Surgeon: San Sandor GAILS, DO;  Location: [facility];  Service: Endoscopy;  Laterality: N/A;   ESOPHAGOGASTRODUODENOSCOPY N/A 12/26/2018   Procedure: ESOPHAGOGASTRODUODENOSCOPY (EGD);   Surgeon: Golda Claudis PENNER, MD;  Location: [facility];  Service: Endoscopy;  Laterality: N/A;   ESOPHAGOGASTRODUODENOSCOPY (EGD) WITH PROPOFOL  N/A 03/23/2019   Procedure: ESOPHAGOGASTRODUODENOSCOPY (EGD) WITH PROPOFOL ;  Surgeon: Golda Claudis PENNER, MD;  Location: [facility];  Service: Endoscopy;  Laterality: N/A;  7:30   FRACTURE SURGERY     ankle and elbow   PERIPHERAL VASCULAR BALLOON ANGIOPLASTY  06/20/2023   Procedure: PERIPHERAL VASCULAR BALLOON ANGIOPLASTY;  Surgeon: Pearline Norman RAMAN, MD;  Location: [facility] INVASIVE CV LAB;  Service: Cardiovascular;;   PH IMPEDANCE STUDY N/A 09/16/2021   Procedure: PH IMPEDANCE STUDY;  Surgeon: San Sandor GAILS, DO;  Location: [facility];  Service: Endoscopy;  Laterality: N/A;   PILONIDAL CYST EXCISION N/A 03/24/2017   Procedure: EXCISION CHRONIC  PILONIDAL ABSCESS;  Surgeon: Vernetta Berg, MD;  Location: [facility];  Service: General;  Laterality: N/A;   SCROTAL EXPLORATION N/A 11/13/2020   Procedure: EXCISION OF SEBACEOUS CYSTS, SCROTUM;  Surgeon: Sherrilee Belvie CROME, MD;  Location: [facility];  Service: Urology;  Laterality: N/A;   TENDON REPAIR Left ~ 2004   main tendon in my ankle   Social History   Occupational History   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

## 2024-04-02 ENCOUNTER — Encounter: Payer: Self-pay | Admitting: Radiology

## 2024-04-08 ENCOUNTER — Emergency Department (HOSPITAL_COMMUNITY): Payer: MEDICAID

## 2024-04-08 ENCOUNTER — Emergency Department (HOSPITAL_COMMUNITY)
Admission: EM | Admit: 2024-04-08 | Discharge: 2024-04-08 | Disposition: A | Discharge status: Home / Self Care | Payer: MEDICAID | Attending: Emergency Medicine | Admitting: Emergency Medicine

## 2024-04-08 DIAGNOSIS — Z794 Long term (current) use of insulin: Secondary | ICD-10-CM | POA: Diagnosis not present

## 2024-04-08 DIAGNOSIS — D649 Anemia, unspecified: Secondary | ICD-10-CM | POA: Diagnosis not present

## 2024-04-08 DIAGNOSIS — E1165 Type 2 diabetes mellitus with hyperglycemia: Secondary | ICD-10-CM | POA: Insufficient documentation

## 2024-04-08 DIAGNOSIS — E875 Hyperkalemia: Secondary | ICD-10-CM | POA: Diagnosis not present

## 2024-04-08 DIAGNOSIS — K5900 Constipation, unspecified: Secondary | ICD-10-CM | POA: Insufficient documentation

## 2024-04-08 DIAGNOSIS — D72829 Elevated white blood cell count, unspecified: Secondary | ICD-10-CM | POA: Diagnosis not present

## 2024-04-08 DIAGNOSIS — I10 Essential (primary) hypertension: Secondary | ICD-10-CM | POA: Diagnosis present

## 2024-04-08 DIAGNOSIS — E119 Type 2 diabetes mellitus without complications: Secondary | ICD-10-CM

## 2024-04-08 DIAGNOSIS — R109 Unspecified abdominal pain: Secondary | ICD-10-CM

## 2024-04-08 DIAGNOSIS — Z79899 Other long term (current) drug therapy: Secondary | ICD-10-CM | POA: Diagnosis not present

## 2024-04-08 DIAGNOSIS — J449 Chronic obstructive pulmonary disease, unspecified: Secondary | ICD-10-CM | POA: Diagnosis present

## 2024-04-08 DIAGNOSIS — F17210 Nicotine dependence, cigarettes, uncomplicated: Secondary | ICD-10-CM | POA: Insufficient documentation

## 2024-04-08 DIAGNOSIS — I2510 Atherosclerotic heart disease of native coronary artery without angina pectoris: Secondary | ICD-10-CM | POA: Insufficient documentation

## 2024-04-08 LAB — CBC WITH DIFFERENTIAL/PLATELET
Abs Immature Granulocytes: 0.05 K/uL (ref 0.00–0.07)
Basophils Absolute: 0.1 K/uL (ref 0.0–0.1)
Basophils Relative: 1 %
Eosinophils Absolute: 0.2 K/uL (ref 0.0–0.5)
Eosinophils Relative: 2 %
HCT: 32.6 % — ABNORMAL LOW (ref 39.0–52.0)
Hemoglobin: 10.7 g/dL — ABNORMAL LOW (ref 13.0–17.0)
Immature Granulocytes: 0 %
Lymphocytes Relative: 15 %
Lymphs Abs: 1.7 K/uL (ref 0.7–4.0)
MCH: 31.8 pg (ref 26.0–34.0)
MCHC: 32.8 g/dL (ref 30.0–36.0)
MCV: 97 fL (ref 80.0–100.0)
Monocytes Absolute: 0.6 K/uL (ref 0.1–1.0)
Monocytes Relative: 5 %
Neutro Abs: 9.1 K/uL — ABNORMAL HIGH (ref 1.7–7.7)
Neutrophils Relative %: 77 %
Platelets: 221 K/uL (ref 150–400)
RBC: 3.36 MIL/uL — ABNORMAL LOW (ref 4.22–5.81)
RDW: 14.6 % (ref 11.5–15.5)
WBC: 11.7 K/uL — ABNORMAL HIGH (ref 4.0–10.5)
nRBC: 0 % (ref 0.0–0.2)

## 2024-04-08 LAB — COMPREHENSIVE METABOLIC PANEL WITH GFR
ALT: 12 U/L (ref 0–44)
AST: 15 U/L (ref 15–41)
Albumin: 3.4 g/dL — ABNORMAL LOW (ref 3.5–5.0)
Alkaline Phosphatase: 120 U/L (ref 38–126)
Anion gap: 10 (ref 5–15)
BUN: 50 mg/dL — ABNORMAL HIGH (ref 6–20)
CO2: 21 mmol/L — ABNORMAL LOW (ref 22–32)
Calcium: 8.3 mg/dL — ABNORMAL LOW (ref 8.9–10.3)
Chloride: 105 mmol/L (ref 98–111)
Creatinine, Ser: 1.78 mg/dL — ABNORMAL HIGH (ref 0.61–1.24)
GFR, Estimated: 45 mL/min — ABNORMAL LOW (ref >60)
Glucose, Bld: 156 mg/dL — ABNORMAL HIGH (ref 70–99)
Potassium: 5.6 mmol/L — ABNORMAL HIGH (ref 3.5–5.1)
Sodium: 136 mmol/L (ref 135–145)
Total Bilirubin: 0.3 mg/dL (ref 0.0–1.2)
Total Protein: 6.1 g/dL — ABNORMAL LOW (ref 6.5–8.1)

## 2024-04-08 LAB — URINALYSIS, ROUTINE W REFLEX MICROSCOPIC
Bacteria, UA: NONE SEEN
Bilirubin Urine: NEGATIVE
Glucose, UA: 50 mg/dL — AB
Hgb urine dipstick: NEGATIVE
Ketones, ur: NEGATIVE mg/dL
Leukocytes,Ua: NEGATIVE
Nitrite: NEGATIVE
Protein, ur: >=300 mg/dL — AB
Specific Gravity, Urine: 1.013 (ref 1.005–1.030)
pH: 5 (ref 5.0–8.0)

## 2024-04-08 LAB — BASIC METABOLIC PANEL WITH GFR
Anion gap: 10 (ref 5–15)
Anion gap: 8 (ref 5–15)
BUN: 49 mg/dL — ABNORMAL HIGH (ref 6–20)
BUN: 45 mg/dL — ABNORMAL HIGH (ref 6–20)
CO2: 20 mmol/L — ABNORMAL LOW (ref 22–32)
CO2: 22 mmol/L (ref 22–32)
Calcium: 8.5 mg/dL — ABNORMAL LOW (ref 8.9–10.3)
Calcium: 8.2 mg/dL — ABNORMAL LOW (ref 8.9–10.3)
Chloride: 106 mmol/L (ref 98–111)
Chloride: 107 mmol/L (ref 98–111)
Creatinine, Ser: 1.68 mg/dL — ABNORMAL HIGH (ref 0.61–1.24)
Creatinine, Ser: 1.67 mg/dL — ABNORMAL HIGH (ref 0.61–1.24)
GFR, Estimated: 48 mL/min — ABNORMAL LOW (ref >60)
GFR, Estimated: 48 mL/min — ABNORMAL LOW (ref >60)
Glucose, Bld: 150 mg/dL — ABNORMAL HIGH (ref 70–99)
Glucose, Bld: 73 mg/dL (ref 70–99)
Potassium: 6.2 mmol/L — ABNORMAL HIGH (ref 3.5–5.1)
Potassium: 5 mmol/L (ref 3.5–5.1)
Sodium: 136 mmol/L (ref 135–145)
Sodium: 137 mmol/L (ref 135–145)

## 2024-04-08 LAB — PROTIME-INR
INR: 0.9 (ref 0.8–1.2)
Prothrombin Time: 13.2 s (ref 11.4–15.2)

## 2024-04-08 LAB — CBG MONITORING, ED: Glucose-Capillary: 96 mg/dL (ref 70–99)

## 2024-04-08 LAB — TYPE AND SCREEN
ABO/RH(D): B POS
Antibody Screen: NEGATIVE

## 2024-04-08 LAB — POC OCCULT BLOOD, ED: Fecal Occult Bld: POSITIVE — AB

## 2024-04-08 MED ORDER — CALCIUM GLUCONATE 10 % IV SOLN
1.0000 g | Freq: Once | INTRAVENOUS | Status: AC
  Administered 2024-04-08: 1 g via INTRAVENOUS
  Filled 2024-04-08: qty 10

## 2024-04-08 MED ORDER — SMOG ENEMA
960.0000 mL | Freq: Once | RECTAL | Status: AC
  Administered 2024-04-08: 960 mL via RECTAL
  Filled 2024-04-08: qty 960

## 2024-04-08 MED ORDER — DEXTROSE 50 % IV SOLN
1.0000 | Freq: Once | INTRAVENOUS | Status: AC
  Administered 2024-04-08: 50 mL via INTRAVENOUS
  Filled 2024-04-08: qty 50

## 2024-04-08 MED ORDER — DOCUSATE SODIUM 100 MG PO CAPS: 100.0000 mg | ORAL_CAPSULE | Freq: Two times a day (BID) | ORAL | 0 refills | Status: AC

## 2024-04-08 MED ORDER — POLYETHYLENE GLYCOL 3350 17 GM/SCOOP PO POWD: 17.0000 g | Freq: Every day | ORAL | 0 refills | Status: AC

## 2024-04-08 MED ORDER — INSULIN ASPART 100 UNIT/ML IV SOLN
5.0000 [IU] | Freq: Once | INTRAVENOUS | Status: AC
  Administered 2024-04-08: 5 [IU] via INTRAVENOUS
  Filled 2024-04-08: qty 1

## 2024-04-08 MED ORDER — IOHEXOL 350 MG/ML SOLN
75.0000 mL | Freq: Once | INTRAVENOUS | Status: AC | PRN
  Administered 2024-04-08: 75 mL via INTRAVENOUS

## 2024-04-08 MED ORDER — SODIUM CHLORIDE 0.9 % IV BOLUS
1000.0000 mL | Freq: Once | INTRAVENOUS | Status: AC
  Administered 2024-04-08: 1000 mL via INTRAVENOUS

## 2024-04-08 MED ORDER — SODIUM ZIRCONIUM CYCLOSILICATE 5 G PO PACK
10.0000 g | PACK | Freq: Once | ORAL | Status: AC
  Administered 2024-04-08: 10 g via ORAL
  Filled 2024-04-08: qty 2

## 2024-04-08 NOTE — ED Triage Notes (Signed)
 Pt complains of constipation x 4 days. Has been trying to strain to have BM and had blood on toilet paper when wiping. Has been taking miralax  but no relief.

## 2024-04-08 NOTE — ED Provider Notes (Cosign Needed Addendum)
 [facility] AT [facility] Provider Note   CSN: [id number] Arrival date & time: 04/08/24  1607     Patient presents with: Constipation   [name] [name] is a 53 y.o. male.   Patient is a 53 year old male who presents emergency department chief complaint of constipation and bloody stools.  He notes that symptoms have been ongoing for approximate the past week.  He notes that he only been having small bowel movements despite taking MiraLAX  with no relief.  He denies any associated chest pain or shortness of breath.  He has no known history of bleeding disorders or current anticoagulation use.  Patient denies any associated fever or chills.  He has had no dysuria or hematuria.   Constipation      Prior to Admission medications   Medication Sig Start Date End Date Taking? Authorizing Provider  acetaminophen  (TYLENOL ) 500 MG tablet Take 1,000 mg by mouth every 6 (six) hours as needed for headache.    [provider]  amLODipine  (NORVASC ) 10 MG tablet Take 1 tablet (10 mg total) by mouth at bedtime. 02/06/24 03/07/24  Willette Adriana LABOR, MD  atorvastatin  (LIPITOR ) 80 MG tablet Take 1 tablet (80 mg total) by mouth at bedtime. 12/01/23   Debera Jayson MATSU, MD  ezetimibe  (ZETIA ) 10 MG tablet Take 1 tablet (10 mg total) by mouth daily. 12/01/23 02/29/24  Debera Jayson MATSU, MD  HYDROcodone -acetaminophen  (NORCO/VICODIN) 5-325 MG tablet Take 1 tablet by mouth every 8 (eight) hours as needed for moderate pain (pain score 4-6). 03/14/24   Zamora, Erin R, NP  Insulin  Aspart FlexPen (NOVOLOG ) 100 UNIT/ML Inject 0-5 Units into the skin 3 (three) times daily as needed (high blood sugar). Sliding scale    [provider]  insulin  glargine (LANTUS  SOLOSTAR) 100 UNIT/ML Solostar Pen Inject 12 Units into the skin daily. 10/27/23   Shamleffer, Ibtehal Jaralla, MD  losartan  (COZAAR ) 25 MG tablet Take 1 tablet (25 mg total) by mouth daily. 03/09/24   Shamleffer, Ibtehal  Jaralla, MD  OLANZapine  (ZYPREXA ) 10 MG tablet Take 1 tablet (10 mg total) by mouth at bedtime. 02/23/24   Dean Clarity, MD  sertraline  (ZOLOFT ) 100 MG tablet Take 2 tablets (200 mg total) by mouth at bedtime. 08/30/22   Christobal Guadalajara, MD    Allergies: Bee venom, Morphine , Nexium [esomeprazole], and Prilosec otc [omeprazole magnesium ]    Review of Systems  Gastrointestinal:  Positive for constipation.  All other systems reviewed and are negative.   Updated Vital Signs BP (!) 146/88   Pulse 85   Temp 98.6 F (37 C)   Resp 18   Ht 5' 8 (1.727 m)   Wt 66.2 kg   SpO2 100%   BMI 22.20 kg/m   Physical Exam Vitals and nursing note reviewed. Exam conducted with a chaperone present.  Constitutional:      General: He is not in acute distress.    Appearance: Normal appearance. He is not ill-appearing.  HENT:     Head: Normocephalic and atraumatic.     Nose: Nose normal.     Mouth/Throat:     Mouth: Mucous membranes are moist.  Eyes:     Extraocular Movements: Extraocular movements intact.     Conjunctiva/sclera: Conjunctivae normal.     Pupils: Pupils are equal, round, and reactive to light.  Cardiovascular:     Rate and Rhythm: Normal rate and regular rhythm.     Pulses: Normal pulses.     Heart sounds: Normal  heart sounds. No murmur heard.    No gallop.  Pulmonary:     Effort: Pulmonary effort is normal. No respiratory distress.     Breath sounds: Normal breath sounds. No stridor. No wheezing, rhonchi or rales.  Abdominal:     General: Abdomen is flat. Bowel sounds are normal. There is no distension.     Palpations: Abdomen is soft.     Tenderness: There is no guarding.     Comments: Mild lower abdominal tenderness  Genitourinary:    Rectum: Guaiac result positive.     Comments: Brown stool in the rectal vault, no diffuse bleeding, small amount of stool in the rectal vault with no impaction Musculoskeletal:        General: Normal range of motion.     Cervical back: Normal  range of motion and neck supple. No rigidity or tenderness.  Skin:    General: Skin is warm and dry.  Neurological:     General: No focal deficit present.     Mental Status: He is alert and oriented to person, place, and time. Mental status is at baseline.  Psychiatric:        Mood and Affect: Mood normal.        Behavior: Behavior normal.        Thought Content: Thought content normal.        Judgment: Judgment normal.     (all labs ordered are listed, but only abnormal results are displayed) Labs Reviewed  COMPREHENSIVE METABOLIC PANEL WITH GFR - Abnormal; Notable for the following components:      Result Value   Potassium 5.6 (*)    CO2 21 (*)    Glucose, Bld 156 (*)    BUN 50 (*)    Creatinine, Ser 1.78 (*)    Calcium  8.3 (*)    Total Protein 6.1 (*)    Albumin  3.4 (*)    GFR, Estimated 45 (*)    All other components within normal limits  CBC WITH DIFFERENTIAL/PLATELET - Abnormal; Notable for the following components:   WBC 11.7 (*)    RBC 3.36 (*)    Hemoglobin 10.7 (*)    HCT 32.6 (*)    Neutro Abs 9.1 (*)    All other components within normal limits  POC OCCULT BLOOD, ED - Abnormal; Notable for the following components:   Fecal Occult Bld POSITIVE (*)    All other components within normal limits  PROTIME-INR  URINALYSIS, ROUTINE W REFLEX MICROSCOPIC  TYPE AND SCREEN    EKG: None  Radiology: No results found.   Procedures   Medications Ordered in the ED - No data to display                                  Medical Decision Making Amount and/or Complexity of Data Reviewed Labs: ordered. Radiology: ordered.  Risk OTC drugs. Prescription drug management. Decision regarding hospitalization.   This patient presents to the ED for concern of constipation, blood in stool differential diagnosis includes lower GI bleed, upper GI bleed, constipation, diverticulitis, electrolyte derangement, acute kidney injury    Additional history  obtained:  Additional history obtained from medical records External records from outside source obtained and reviewed including medical records   Lab Tests:  I Ordered, and personally interpreted labs.  The pertinent results include: Mild leukocytosis, anemia at baseline, hyperkalemia, elevated creatinine, hyperglycemia, normal liver function, unremarkable urinalysis, Hemoccult positive  stool   Imaging Studies ordered:  I ordered imaging studies including CTA abdomen and pelvis I independently visualized and interpreted imaging which showed no active bleeding, constipation I agree with the radiologist interpretation   Medicines ordered and prescription drug management:  I ordered medication including smog enema, insulin  and D50, calcium  gluconate, Lokelma , IV fluids for constipation, hyperkalemia Reevaluation of the patient after these medicines showed that the patient improved I have reviewed the patients home medicines and have made adjustments as needed   Problem List / ED Course:  Patient is doing well at this time and does remain stable.  Discussed with patient that CT scan of the abdomen and pelvis does demonstrate constipation with no other acute surgical process or any signs of active bleeding.  He was Hemoccult positive on exam but did have brown stool in the rectal vault with no indication for active bleeding at this point.  Do suspect that this is secondary to his straining and most likely internal hemorrhoids.  Patient does have associated hyperkalemia and treatment has been started for this.  He does have some mild peaked T waves on EKG and we will plan for admission to the hospital service.  Have discussed patient case with Dr. FORBES Carwin with the hospital service who has excepted for admission.  Do not suspect an emergent GI consult is warranted. After receiving the enema patient had a large bowel movement and symptoms have completely resolved at this time.  Did recheck his  potassium which has returned back to normal limits at this point.  He was evaluated by the hospitalist and may be discharged home at this time with close follow-up with his PCP.  He does not warrant admission at this time.  Discussed the importance of contacting his primary care doctor in the morning for close follow-up for recheck of his potassium.  Will provide additional medications to help with his constipation at home.  Do not suspect any further workup is warranted on emergent basis in regards to the blood in his stools.    Social Determinants of Health:  None        Final diagnoses:  None    ED Discharge Orders     None          Daralene Lonni BIRCH, PA-C 04/08/24 2021    Daralene Lonni BIRCH, PA-C 04/08/24 2217    Franklyn Sid SAILOR, MD 04/10/24 [phone]

## 2024-04-08 NOTE — Discharge Instructions (Signed)
 Please follow-up closely with your primary care doctor on an outpatient basis.  Please call tomorrow to make an appointment for a recheck of your potassium to ensure that this continues to improve and does not go back up.  Return to emergency department immediately for any new or worsening symptoms.

## 2024-04-08 NOTE — Consult Note (Signed)
 Initial Consultation Note   Patient: [name] [name] FMW:[id number] DOB: 1970/10/15 PCP: [name], PA-C DOA: 04/08/2024 DOS: the patient was seen and examined on 04/08/2024 Primary service: Pearlean Tully BRAVO, MD  Referring physician: EDP  Reason for consult: Hyperkalemia  Assessment/Plan: Constipation-CT angio abdomen and pelvis with moderate colonic stool burden compatible with constipation.  Smog enema given in ED with large bowel movements.  Patient feels much better.   - I have explained to patient to start a bowel regimen with MiraLAX  daily.  He voices understanding. - 1 L bolus given.  Hyperkalemia-potassium 5.6 > 6.2.  Stable renal function.  Has been drinking Ensure recently- twice daily.  Did not take losartan  for the past few days, but took it today.  Losartan  dose is quite small at 25 mg daily doubt this is the etiology of his hyperkalemia.  He has been on losartan  for at least 3 months.  Started by his nephrologist to for his renal function. - I have explained to patient to hold off on Ensure and eat a regular diet, now that he is feeling better. - Hyperkalemia cocktail given in ED with calcium  gluconate, Lokelma  10 mg, D50 and insulin  5 units. - Check CBG prior to discharge -  I explained to patient to follow-up with his outpatient providers for blood work in about a week,  help determine if losartan  is the culprit, or if contributing to hyperkalemia.  For now he will continue with his home meds.  The Rest of patient's medical problems are stable  Assessment and Plan:  HPI: [name] [name] is a 53 y.o. male with past medical history of hypertension, COPD, diabetes mellitus, potation of 2 toes on his left foot. Patient presented to the ED with complaints of constipation of about 1 week duration.  He has been drinking Ensure twice every day because he was unable to eat due to discomfort in his abdomen from not having a bowel movement.  No vomiting.  He did not take his  losartan  over the past few days but took it today.  Today he felt worse so he came to the ED.  He reports seeing some streaks of blood on the paper towel after he wiped, but no blood in stools.    ED course-vitals stable.  Potassium 5.6 >> 6.2.  Stable renal function. CT angio abdomen and pelvis-no evidence of acute bleeding, diffuse bladder wall thickening correlate for possible cystitis or outlet obstruction.   UA -not suggestive of UTI negative for nitrite leukocyte no bacteria seen, 6-10 WBCs. Hyperkalemia cocktail given in ED with Lokelma  10 g, calcium  gluconate 1 g, D50 and 5 units of insulin  given. Patient had smog enema, subsequently had a large bowel movement felt significantly better and requesting to go home.  Repeat BMP shows potassium now- 5.  Patient to be discharged home.  Review of Systems: As mentioned in the history of present illness. All other systems reviewed and are negative. Past Medical History:  Diagnosis Date   Anxiety    Arthritis    Asthma    CAD (coronary artery disease)    a. 03/2015 Cath: LM nl, LAD mild diff dzs throughout w/ 50p/d, LCX diff dzs, 73m, RCA diff dzs, 35m-->Med rx; b. 12/2020 MV: EF 57%, small/mild apical inferior defect w/ partial rev/mild ischemia.   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, bilateral    Sleep apnea 2016   does not use CPAP   Type 2 diabetes mellitus (HCC)    Past Surgical History:  Procedure Laterality Date   ABDOMINAL AORTOGRAM W/LOWER EXTREMITY N/A 06/20/2023   Procedure: ABDOMINAL AORTOGRAM W/LOWER EXTREMITY;  Surgeon: Pearline Norman RAMAN, MD;  Location: [facility] INVASIVE CV LAB;   Service: Cardiovascular;  Laterality: N/A;   AMPUTATION Left 06/24/2023   Procedure: LEFT 5TH TOE AMPUTATION;  Surgeon: Harden Jerona GAILS, MD;  Location: [facility] OR;  Service: Orthopedics;  Laterality: Left;   AMPUTATION Left 07/20/2023   Procedure: LEFT 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 AND 5TH RAY AMPUTATION;  Surgeon: Harden Jerona GAILS, MD;  Location: [facility] OR;  Service: Orthopedics;  Laterality: Left;   ANKLE SURGERY Right 1982   had extra bones in there; took them out   APPENDECTOMY  1975   BIOPSY  12/26/2018   Procedure: BIOPSY;  Surgeon: Golda Claudis PENNER, MD;  Location: [facility] SU[facility];  Service: Endoscopy;;  duodenal biopsies   BIOPSY  03/23/2019   Procedure: BIOPSY;  Surgeon: Golda Claudis PENNER, MD;  Location: [facility];  Service: Endoscopy;;  esophagus   CARDIAC CATHETERIZATION N/A 03/28/2015   Procedure: Left Heart Cath and Coronary Angiography;  Surgeon: Gordy Bergamo, MD;  Location: [facility] INVASIVE CV LAB;  Service: Cardiovascular;  Laterality: N/A;   CARDIAC CATHETERIZATION N/A 03/28/2015   Procedure: Intravascular Pressure Wire/FFR Study;  Surgeon: Gordy Bergamo, MD;  Location: [facility] INVASIVE CV LAB;  Service: Cardiovascular;  Laterality: N/A;   CARPAL TUNNEL RELEASE Left ~ 2008   COLONOSCOPY WITH ESOPHAGOGASTRODUODENOSCOPY (EGD)     COLONOSCOPY WITH PROPOFOL  N/A 12/24/2019   Procedure: COLONOSCOPY WITH PROPOFOL ;  Surgeon: Golda Claudis PENNER, MD;  Location: [facility];  Service: Endoscopy;  Laterality: N/A;  955   ELBOW FRACTURE SURGERY Left ~ 2008   ESOPHAGEAL MANOMETRY N/A 09/16/2021   Procedure: ESOPHAGEAL MANOMETRY (EM);  Surgeon: San Sandor GAILS, DO;  Location: [facility] ENDOSCOPY;  Service: Endoscopy;  Laterality: N/A;   ESOPHAGOGASTRODUODENOSCOPY N/A 12/26/2018   Procedure: ESOPHAGOGASTRODUODENOSCOPY (EGD);  Surgeon: Golda Claudis PENNER, MD;  Location: [facility];  Service: Endoscopy;  Laterality: N/A;   ESOPHAGOGASTRODUODENOSCOPY (EGD) WITH PROPOFOL  N/A 03/23/2019   Procedure: ESOPHAGOGASTRODUODENOSCOPY (EGD) WITH PROPOFOL ;   Surgeon: Golda Claudis PENNER, MD;  Location: [facility];  Service: Endoscopy;  Laterality: N/A;  7:30   FRACTURE SURGERY     ankle and elbow   PERIPHERAL VASCULAR BALLOON ANGIOPLASTY  06/20/2023   Procedure: PERIPHERAL VASCULAR BALLOON ANGIOPLASTY;  Surgeon: Pearline Norman RAMAN, MD;  Location: [facility] INVASIVE CV LAB;  Service: Cardiovascular;;   PH IMPEDANCE STUDY N/A 09/16/2021   Procedure: PH IMPEDANCE STUDY;  Surgeon: San Sandor GAILS, DO;  Location: [facility] ENDOSCOPY;  Service: Endoscopy;  Laterality: N/A;   PILONIDAL CYST EXCISION N/A 03/24/2017   Procedure: EXCISION CHRONIC  PILONIDAL ABSCESS;  Surgeon: Vernetta Berg, MD;  Location: [facility] ORS;  Service: General;  Laterality: N/A;   SCROTAL EXPLORATION N/A 11/13/2020   Procedure: EXCISION OF SEBACEOUS CYSTS, SCROTUM;  Surgeon: Sherrilee Belvie CROME, MD;  Location: [facility] ORS;  Service: Urology;  Laterality: N/A;   TENDON REPAIR Left ~ 2004   main tendon in my ankle   Social History:  reports  that he has been smoking cigarettes. He has a 34 pack-year smoking history. He has never been exposed to tobacco smoke. He has never used smokeless tobacco. He reports that he does not drink alcohol and does not use drugs.  Allergies  Allergen Reactions   Bee Venom Swelling   Morphine  Itching and Nausea And Vomiting    IV site   Nexium [Esomeprazole] Swelling    Face swells, no breathing impairment   Prilosec Otc [Omeprazole Magnesium ] Swelling    Face swells, no breathing impairment    Family History  Problem Relation Age of Onset   Diabetes Mother    Hypertension Mother    Cancer Mother    Throat cancer Mother    Diabetes Father    Hypertension Father    Hyperlipidemia Father    Congestive Heart Failure Sister    Colon cancer Neg Hx    Pancreatic cancer Neg Hx    Liver disease Neg Hx     Prior to Admission medications   Medication Sig Start Date End Date Taking? Authorizing Provider  acetaminophen  (TYLENOL ) 500 MG tablet Take 1,000 mg by mouth  every 6 (six) hours as needed for headache.    [provider]  amLODipine  (NORVASC ) 10 MG tablet Take 1 tablet (10 mg total) by mouth at bedtime. 02/06/24 03/07/24  Willette Adriana LABOR, MD  atorvastatin  (LIPITOR ) 80 MG tablet Take 1 tablet (80 mg total) by mouth at bedtime. 12/01/23   Debera Jayson MATSU, MD  ezetimibe  (ZETIA ) 10 MG tablet Take 1 tablet (10 mg total) by mouth daily. 12/01/23 02/29/24  Debera Jayson MATSU, MD  HYDROcodone -acetaminophen  (NORCO/VICODIN) 5-325 MG tablet Take 1 tablet by mouth every 8 (eight) hours as needed for moderate pain (pain score 4-6). 03/14/24   Zamora, Erin R, NP  Insulin  Aspart FlexPen (NOVOLOG ) 100 UNIT/ML Inject 0-5 Units into the skin 3 (three) times daily as needed (high blood sugar). Sliding scale    [provider]  insulin  glargine (LANTUS  SOLOSTAR) 100 UNIT/ML Solostar Pen Inject 12 Units into the skin daily. 10/27/23   Shamleffer, Ibtehal Jaralla, MD  losartan  (COZAAR ) 25 MG tablet Take 1 tablet (25 mg total) by mouth daily. 03/09/24   Shamleffer, Ibtehal Jaralla, MD  OLANZapine  (ZYPREXA ) 10 MG tablet Take 1 tablet (10 mg total) by mouth at bedtime. 02/23/24   Dean Clarity, MD  sertraline  (ZOLOFT ) 100 MG tablet Take 2 tablets (200 mg total) by mouth at bedtime. 08/30/22   Christobal Guadalajara, MD    Physical Exam: Vitals:   04/08/24 1635 04/08/24 1636 04/08/24 1855  BP:  (!) 146/88 (!) 165/99  Pulse:  85 89  Resp:  18 18  Temp:  98.6 F (37 C) 98.5 F (36.9 C)  SpO2:  100% 100%  Weight: 66.2 kg    Height: 5' 8 (1.727 m)      Family Communication: None at bedside.  Primary team communication:  Thank you very much for involving us  in the care of your patient.  Author: Tully FORBES Carwin, MD 04/08/2024 10:07 PM  For on call review [email or web address].

## 2024-04-08 NOTE — ED Notes (Signed)
 AC notified of need for SMOG enema

## 2024-04-08 NOTE — ED Notes (Addendum)
 Pt had large BM- says he feels better.

## 2024-04-09 LAB — POC OCCULT BLOOD, ED: Fecal Occult Bld: POSITIVE — AB

## 2024-04-16 ENCOUNTER — Other Ambulatory Visit (INDEPENDENT_AMBULATORY_CARE_PROVIDER_SITE_OTHER): Payer: MEDICAID

## 2024-04-16 ENCOUNTER — Ambulatory Visit: Payer: MEDICAID | Admitting: Orthopedic Surgery

## 2024-04-16 DIAGNOSIS — M25511 Pain in right shoulder: Secondary | ICD-10-CM | POA: Diagnosis not present

## 2024-04-16 DIAGNOSIS — G8929 Other chronic pain: Secondary | ICD-10-CM | POA: Diagnosis not present

## 2024-04-16 NOTE — Progress Notes (Signed)
 Office Visit Note   Patient: [name] [name]           Date of Birth: Jun 03, 1970           MRN: [id number] Visit Date: 04/16/2024 Requested by: [name], PA-C [address] [place],  [place] [address] PCP: [name], PA-C  Subjective: Chief Complaint  Patient presents with   Right Shoulder - Fracture    DOI 02/04/2024    HPI: [name] [name] is a 53 y.o. male who presents to the office reporting right shoulder pain.  Patient stated injury was likely sometime in September.  He does not remember the exact details.  He states he has pain in the right shoulder which runs down his arm.  Worse at night when he lays down flat.  Takes hydrocodone  for symptoms.  He has a cardiologist as well as a medical physician.  Patient is a smoker..                ROS: All systems reviewed are negative as they relate to the chief complaint within the history of present illness.  Patient denies fevers or chills.  Assessment & Plan: Visit Diagnoses:  1. 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  The shaft is significantly anterior.  Plan at this time is thin cut CT for preop RSA evaluation.  The arm is moving as a unit at this time but has diminished functional capacity due to the malunion present.  Follow-Up Instructions: No follow-ups on file.   Orders:  Orders Placed This Encounter  Procedures   XR Shoulder Right   No orders of the defined types were placed in this encounter.     Procedures: No procedures performed   Clinical Data: No additional findings.  Objective: Vital Signs: There were no vitals taken for this visit.  Physical Exam:  Constitutional: Patient appears well-developed HEENT:  Head: Normocephalic Eyes:EOM are normal Neck: Normal range of motion Cardiovascular: Normal rate Pulmonary/chest: Effort normal Neurologic:  Patient is alert Skin: Skin is warm Psychiatric: Patient has normal mood and affect  Ortho Exam: Ortho exam demonstrates functional deltoid.  Patient has 5 out of 5 grip EPL FPL interosseous restriction extension biceps triceps and deltoid strength.  Active and passive range of motion diminished significantly.  External rotation strength is 4- out of 5 subscap strength is 4+ out of 5 on the right.  Specialty Comments:  No specialty comments available.  Imaging: No results found.   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 07/12/2023   Gangrene of toe of left foot (HCC) 06/24/2023   Diabetes mellitus (HCC) 04/11/2023   PTSD (post-traumatic stress disorder) 03/14/2023   Aortic valve sclerosis 09/16/2022   COPD GOLD 1/ AB 06/11/2022   Syncope and collapse 12/13/2021   Bifascicular block 12/13/2021   Renal insufficiency 12/13/2021   Esophageal motility disorder 12/13/2021   Scrotal abscess 11/19/2020   Barrett's esophagus 01/23/2019   MDD (major depressive disorder), severe (HCC) 12/17/2018   Uncontrolled type 2 diabetes mellitus with hyperglycemia (HCC) 03/22/2018   ARF (acute renal failure) 05/30/2017   Mixed hyperlipidemia  03/31/2017   Diabetic polyneuropathy (HCC) 03/31/2017   Vitamin B12 deficiency 03/31/2017   Vitamin D  deficiency 03/31/2017   Right bundle branch block 02/14/2017   Gastroesophageal reflux disease 10/15/2016   Benign hypertension 10/15/2016   Leukocytosis 11/25/2015   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 03/2015 Cath: LM nl, LAD mild diff dzs throughout w/ 50p/d, LCX diff dzs, 56m, RCA diff dzs, 49m-->Med rx; b. 12/2020 MV: EF 57%, small/mild apical inferior defect w/ partial rev/mild ischemia.   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, bilateral    Sleep apnea 2016   does not use CPAP   Type 2 diabetes mellitus (HCC)     Family History  Problem Relation Age of Onset   Diabetes Mother    Hypertension Mother    Cancer Mother    Throat cancer Mother    Diabetes Father    Hypertension Father    Hyperlipidemia Father    Congestive Heart Failure Sister    Colon cancer Neg Hx    Pancreatic cancer Neg Hx    Liver disease Neg Hx     Past Surgical History:  Procedure Laterality Date   ABDOMINAL AORTOGRAM W/LOWER EXTREMITY N/A 06/20/2023   Procedure: ABDOMINAL AORTOGRAM W/LOWER EXTREMITY;  Surgeon: Pearline Norman RAMAN, MD;  [facility];  Service: Cardiovascular;  Laterality: N/A;   AMPUTATION Left 06/24/2023   Procedure: LEFT 5TH TOE AMPUTATION;  Surgeon: Harden Jerona GAILS, MD;  Location: [facility] OR;  Service: Orthopedics;  Laterality: Left;   AMPUTATION Left 07/20/2023   Procedure: LEFT FOOT 4TH AND 5TH RAY AMPUTATION;  Surgeon: Harden Jerona GAILS, MD;  Location: [facility] OR;  Service: Orthopedics;  Laterality: Left;   ANKLE SURGERY Right 1982   had extra bones in there; took them out   APPENDECTOMY  1975   BIOPSY  12/26/2018   Procedure: BIOPSY;  Surgeon: Golda Claudis PENNER, MD;  [facility];  Service: Endoscopy;;   duodenal biopsies   BIOPSY  03/23/2019   Procedure: BIOPSY;  Surgeon: Golda Claudis PENNER, MD;  [facility];  Service: Endoscopy;;  esophagus   CARDIAC CATHETERIZATION N/A 03/28/2015   Procedure: Left Heart Cath and Coronary Angiography;  Surgeon: Gordy Bergamo, MD;  Location: [facility]VASIVE [facility];  Service: Cardiovascular;  Laterality: N/A;   CARDIAC CATHETERIZATION N/A 03/28/2015   Procedure: Intravascular Pressure Wire/FFR Study;  Surgeon: Gordy Bergamo, MD;  Location: [facility] INVASIVE CV LAB;  Service: Cardiovascular;  Laterality: N/A;   CARPAL TUNNEL RELEASE Left ~ 2008   COLONOSCOPY WITH ESOPHAGOGASTRODUODENOSCOPY (EGD)     COLONOSCOPY WITH PROPOFOL  N/A 12/24/2019   Procedure: COLONOSCOPY WITH PROPOFOL ;  Surgeon: Golda Claudis PENNER, MD;  [facility];  Service: Endoscopy;  Laterality: N/A;  955   ELBOW FRACTURE SURGERY Left ~ 2008   ESOPHAGEAL MANOMETRY N/A 09/16/2021   Procedure: ESOPHAGEAL MANOMETRY (EM);  Surgeon: San Sandor GAILS, DO;  Location: [facility];  Service: Endoscopy;  Laterality: N/A;   ESOPHAGOGASTRODUODENOSCOPY N/A 12/26/2018   Procedure: ESOPHAGOGASTRODUODENOSCOPY (EGD);  Surgeon: Golda Claudis PENNER, MD;  [facility];  Service: Endoscopy;  Laterality: N/A;   ESOPHAGOGASTRODUODENOSCOPY (EGD) WITH PROPOFOL  N/A 03/23/2019   Procedure: ESOPHAGOGASTRODUODENOSCOPY (EGD) WITH PROPOFOL ;  Surgeon: Golda Claudis PENNER, MD;  [facility];  Service: Endoscopy;  Laterality: N/A;  7:30   FRACTURE SURGERY     ankle and elbow   PERIPHERAL VASCULAR BALLOON ANGIOPLASTY  06/20/2023   Procedure: PERIPHERAL VASCULAR BALLOON ANGIOPLASTY;  Surgeon: Pearline Norman RAMAN, MD;  [facility]: [facility];  Service: Cardiovascular;;   PH IMPEDANCE STUDY N/A 09/16/2021   Procedure: PH IMPEDANCE STUDY;  Surgeon: San Sandor GAILS, DO;  Location: [facility];  Service: Endoscopy;  Laterality: N/A;   PILONIDAL CYST EXCISION N/A 03/24/2017   Procedure: EXCISION CHRONIC  PILONIDAL  ABSCESS;  Surgeon: Vernetta Berg, MD;  Location: [facility];  Service: General;  Laterality: N/A;   SCROTAL EXPLORATION N/A 11/13/2020   Procedure: EXCISION OF SEBACEOUS CYSTS, SCROTUM;  Surgeon: Sherrilee Belvie CROME, MD;  Location: [facility];  Service: Urology;  Laterality: N/A;   TENDON REPAIR Left ~ 2004   main tendon in my ankle   Social History   Occupational History   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

## 2024-04-17 ENCOUNTER — Encounter: Payer: Self-pay | Admitting: Orthopedic Surgery

## 2024-04-20 ENCOUNTER — Encounter: Payer: Self-pay | Admitting: Orthopedic Surgery

## 2024-04-20 ENCOUNTER — Ambulatory Visit
Admission: RE | Admit: 2024-04-20 | Discharge: 2024-04-20 | Disposition: A | Discharge status: Home / Self Care | Payer: MEDICAID | Source: Ambulatory Visit | Attending: Orthopedic Surgery | Admitting: Orthopedic Surgery

## 2024-04-20 DIAGNOSIS — G8929 Other chronic pain: Secondary | ICD-10-CM

## 2024-05-09 ENCOUNTER — Ambulatory Visit: Payer: MEDICAID | Admitting: Orthopedic Surgery

## 2024-05-09 ENCOUNTER — Other Ambulatory Visit: Payer: MEDICAID

## 2024-05-09 DIAGNOSIS — S42211A Unspecified displaced fracture of surgical neck of right humerus, initial encounter for closed fracture: Secondary | ICD-10-CM

## 2024-05-12 ENCOUNTER — Encounter: Payer: Self-pay | Admitting: Orthopedic Surgery

## 2024-05-12 NOTE — Progress Notes (Signed)
 Office Visit Note   Patient: [name] [name]           Date of Birth: 03/03/1971           MRN: [id number] Visit Date: 05/09/2024 Requested by: [name], PA-C [address] [place],  [place] [address] PCP: [name], PA-C  Subjective: Chief Complaint  Patient presents with   Right Shoulder - Follow-up, Fracture    HPI: [name] [name] is a 53 y.o. male who presents to the office reporting right shoulder pain.  Since he was last seen has had a CT scan.  That shows show I malunited proximal humerus fracture with minimal but some callus across the fracture site.  Significant deformity is present with the shaft anterior to the humeral head.  Patient reports significant disability and dysfunction in the right shoulder.  Does have a history of diabetes..                ROS: All systems reviewed are negative as they relate to the chief complaint within the history of present illness.  Patient denies fevers or chills.  Assessment & Plan: Visit Diagnoses:  1. Closed displaced fracture of surgical neck of right humerus, unspecified fracture morphology, initial encounter     Plan: Impression is right shoulder fracture malunion/nonunion with scalloping of the humeral head.  Patient will need reverse shoulder replacement to have a chance that improved function and pain relief.  Risk and benefits are discussed with the patient including not limited to infection nerve or vessel damage incomplete pain relief as well as incomplete restoration of function and instability.  Tuberosity healing to the shaft would improve his potential outcome.  Patient understands the risk and benefits of surgery.  We need to make sure his hemoglobin A1c is trending in the right direction.  Last level was 8.63 months ago.  Redrawn today.  We can potentially set him up for surgery if that is trending in the right direction towards 8.0.  Follow-Up Instructions: No follow-ups on file.   Orders:  Orders Placed This  Encounter  Procedures   XR Shoulder Right   No orders of the defined types were placed in this encounter.     Procedures: No procedures performed   Clinical Data: No additional findings.  Objective: Vital Signs: There were no vitals taken for this visit.  Physical Exam:  Constitutional: Patient appears well-developed HEENT:  Head: Normocephalic Eyes:EOM are normal Neck: Normal range of motion Cardiovascular: Normal rate Pulmonary/chest: Effort normal Neurologic: Patient is alert Skin: Skin is warm Psychiatric: Patient has normal mood and affect  Ortho Exam: Physical exam demonstrates active forward flexion and abduction both below 45 degrees.  Has 4+ out of 5 external rotation strength and internal rotation strength but with functional deltoid.  Shaft is prominent anteriorly.  Shoulder is located.  Motor or sensory function in the hand is intact.  Specialty Comments:  No specialty comments available.  Imaging: No results found.   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 07/12/2023   Gangrene of toe of left foot (HCC) 06/24/2023   Diabetes mellitus (HCC) 04/11/2023   PTSD (post-traumatic stress disorder) 03/14/2023   Aortic valve sclerosis 09/16/2022   COPD GOLD 1/ AB 06/11/2022  Syncope and collapse 12/13/2021   Bifascicular block 12/13/2021   Renal insufficiency 12/13/2021   Esophageal motility disorder 12/13/2021   Scrotal abscess 11/19/2020   Barrett's esophagus 01/23/2019   MDD (major depressive disorder), severe (HCC) 12/17/2018   Uncontrolled type 2 diabetes mellitus with hyperglycemia (HCC) 03/22/2018   ARF (acute renal failure) 05/30/2017   Mixed hyperlipidemia 03/31/2017   Diabetic  polyneuropathy (HCC) 03/31/2017   Vitamin B12 deficiency 03/31/2017   Vitamin D  deficiency 03/31/2017   Right bundle branch block 02/14/2017   Gastroesophageal reflux disease 10/15/2016   Benign hypertension 10/15/2016   Leukocytosis 11/25/2015   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 03/2015 Cath: LM nl, LAD mild diff dzs throughout w/ 50p/d, LCX diff dzs, 86m, RCA diff dzs, 73m-->Med rx; b. 12/2020 MV: EF 57%, small/mild apical inferior defect w/ partial rev/mild ischemia.   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, bilateral    Sleep apnea 2016   does not use CPAP   Type 2 diabetes mellitus (HCC)     Family History  Problem Relation Age of Onset   Diabetes Mother    Hypertension Mother    Cancer Mother    Throat cancer Mother    Diabetes Father    Hypertension Father    Hyperlipidemia Father    Congestive Heart Failure Sister    Colon cancer Neg Hx    Pancreatic cancer Neg Hx    Liver disease Neg Hx     Past Surgical History:  Procedure Laterality Date   ABDOMINAL AORTOGRAM W/LOWER EXTREMITY N/A 06/20/2023   Procedure: ABDOMINAL AORTOGRAM W/LOWER EXTREMITY;  Surgeon: Pearline Norman RAMAN, MD;  Location: [facility];  Service: Cardiovascular;  Laterality: N/A;   AMPUTATION Left 06/24/2023   Procedure: LEFT 5TH TOE  AMPUTATION;  Surgeon: Harden Jerona GAILS, MD;  Location: [facility] OR;  Service: Orthopedics;  Laterality: Left;   AMPUTATION Left 07/20/2023   Procedure: LEFT FOOT 4TH AND 5TH RAY AMPUTATION;  Surgeon: Harden Jerona GAILS, MD;  Location: [facility] OR;  Service: Orthopedics;  Laterality: Left;   ANKLE SURGERY Right 1982   had extra bones in there; took them out   APPENDECTOMY  1975   BIOPSY  12/26/2018   Procedure: BIOPSY;  Surgeon: Golda Claudis PENNER, MD;  [facility];  Service: Endoscopy;;  duodenal biopsies   BIOPSY  03/23/2019   Procedure: BIOPSY;  Surgeon: Golda Claudis PENNER, MD;  [facility];  Service: Endoscopy;;  esophagus   CARDIAC CATHETERIZATION N/A 03/28/2015   Procedure: Left Heart Cath and Coronary Angiography;  Surgeon: Gordy Bergamo, MD;  Location: [facility] INVASIVE CV LAB;  Service: Cardiovascular;  Laterality: N/A;   CARDIAC CATHETERIZATION N/A 03/28/2015   Procedure: Intravascular Pressure Wire/FFR  Study;  Surgeon: Gordy Bergamo, MD;  Location: [facility] INVASIVE CV LAB;  Service: Cardiovascular;  Laterality: N/A;   CARPAL TUNNEL RELEASE Left ~ 2008   COLONOSCOPY WITH ESOPHAGOGASTRODUODENOSCOPY (EGD)     COLONOSCOPY WITH PROPOFOL  N/A 12/24/2019   Procedure: COLONOSCOPY WITH PROPOFOL ;  Surgeon: Golda Claudis PENNER, MD;  [facility];  Service: Endoscopy;  Laterality: N/A;  955   ELBOW FRACTURE SURGERY Left ~ 2008   ESOPHAGEAL MANOMETRY N/A 09/16/2021   Procedure: ESOPHAGEAL MANOMETRY (EM);  Surgeon: San Sandor GAILS, DO;  Location: [facility];  Service: Endoscopy;  Laterality: N/A;   ESOPHAGOGASTRODUODENOSCOPY N/A 12/26/2018   Procedure: ESOPHAGOGASTRODUODENOSCOPY (EGD);  Surgeon: Golda Claudis PENNER, MD;  [facility];  Service: Endoscopy;  Laterality: N/A;   ESOPHAGOGASTRODUODENOSCOPY (EGD) WITH PROPOFOL  N/A 03/23/2019   Procedure: ESOPHAGOGASTRODUODENOSCOPY (EGD) WITH PROPOFOL ;  Surgeon: Golda Claudis PENNER, MD;  [facility];  Service: Endoscopy;  Laterality: N/A;  7:30    FRACTURE SURGERY     ankle and elbow   PERIPHERAL VASCULAR BALLOON ANGIOPLASTY  06/20/2023   Procedure: PERIPHERAL VASCULAR BALLOON ANGIOPLASTY;  Surgeon: Pearline Norman RAMAN, MD;  Location: [facility];  Service: Cardiovascular;;   PH IMPEDANCE STUDY N/A 09/16/2021   Procedure: PH IMPEDANCE STUDY;  Surgeon: San Sandor GAILS, DO;  Location: [facility];  Service: Endoscopy;  Laterality: N/A;   PILONIDAL CYST EXCISION N/A 03/24/2017   Procedure: EXCISION CHRONIC  PILONIDAL ABSCESS;  Surgeon: Vernetta Berg, MD;  Location: [facility];  Service: General;  Laterality: N/A;   SCROTAL EXPLORATION N/A 11/13/2020   Procedure: EXCISION OF SEBACEOUS CYSTS, SCROTUM;  Surgeon: Sherrilee Belvie CROME, MD;  Location: [facility];  Service: Urology;  Laterality: N/A;   TENDON REPAIR Left ~ 2004   main tendon in my ankle   Social History   Occupational History   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

## 2024-05-15 ENCOUNTER — Encounter: Payer: Self-pay | Admitting: Orthopedic Surgery

## 2024-05-16 ENCOUNTER — Other Ambulatory Visit: Payer: Self-pay | Admitting: Orthopedic Surgery

## 2024-05-16 MED ORDER — HYDROCODONE-ACETAMINOPHEN 5-325 MG PO TABS: 1.0000 | ORAL_TABLET | Freq: Two times a day (BID) | ORAL | 0 refills | Status: DC | PRN

## 2024-05-16 NOTE — Telephone Encounter (Signed)
 Meds sent

## 2024-05-19 ENCOUNTER — Telehealth: Payer: Self-pay

## 2024-05-19 ENCOUNTER — Other Ambulatory Visit (HOSPITAL_COMMUNITY): Payer: Self-pay

## 2024-05-19 NOTE — Telephone Encounter (Signed)
 Pharmacy Patient Advocate Encounter   Received notification from CoverMyMeds that prior authorization for Freestyle libre 3 plus sensor is required/requested.   Insurance verification completed.   The patient is insured through [employer].   Per test claim: PA required; PA submitted to above mentioned insurance via Latent Key/confirmation #/EOC [id number] Status is pending

## 2024-05-21 NOTE — Telephone Encounter (Signed)
 Pharmacy Patient Advocate Encounter  Received notification from [facility] that Prior Authorization for FreeStyle Libre 3 Plus Sensor  has been APPROVED from 05/19/2024 to 05/18/2025   PA #/Case ID/Reference #: [id number]

## 2024-06-11 ENCOUNTER — Ambulatory Visit: Payer: MEDICAID | Admitting: Radiology

## 2024-06-11 ENCOUNTER — Other Ambulatory Visit: Payer: Self-pay | Admitting: Orthopedic Surgery

## 2024-06-11 ENCOUNTER — Telehealth: Payer: Self-pay

## 2024-06-11 DIAGNOSIS — E1165 Type 2 diabetes mellitus with hyperglycemia: Secondary | ICD-10-CM | POA: Diagnosis not present

## 2024-06-11 LAB — POCT GLYCOSYLATED HEMOGLOBIN (HGB A1C): Hemoglobin A1C: 6.7 % — AB (ref 4.0–5.6)

## 2024-06-11 MED ORDER — HYDROCODONE-ACETAMINOPHEN 5-325 MG PO TABS: 1.0000 | ORAL_TABLET | Freq: Two times a day (BID) | ORAL | 0 refills | Status: AC | PRN

## 2024-06-11 NOTE — Telephone Encounter (Signed)
 sent

## 2024-06-11 NOTE — Telephone Encounter (Signed)
 He can be posted but we need to post him with the identity fracture stem from [employer].  Can you let Marval know to pass that on to [place].  Thanks

## 2024-06-11 NOTE — Telephone Encounter (Signed)
 A1C done today. Results sent to Dr Addie.

## 2024-06-13 NOTE — Telephone Encounter (Signed)
 Ok for surgery

## 2024-06-15 NOTE — Telephone Encounter (Signed)
 Hi can you leave me blue sheet debbie does not have thx

## 2024-07-02 ENCOUNTER — Ambulatory Visit: Payer: MEDICAID | Admitting: Orthopedic Surgery

## 2024-07-03 ENCOUNTER — Ambulatory Visit: Payer: MEDICAID | Admitting: Internal Medicine

## 2024-07-03 ENCOUNTER — Ambulatory Visit: Payer: MEDICAID | Admitting: Orthopedic Surgery

## 2024-07-03 ENCOUNTER — Telehealth (HOSPITAL_BASED_OUTPATIENT_CLINIC_OR_DEPARTMENT_OTHER): Payer: Self-pay | Admitting: *Deleted

## 2024-07-03 ENCOUNTER — Encounter: Payer: Self-pay | Admitting: Orthopedic Surgery

## 2024-07-03 ENCOUNTER — Encounter: Payer: Self-pay | Admitting: Internal Medicine

## 2024-07-03 VITALS — BP 148/90 | Ht 68.0 in | Wt 144.0 lb

## 2024-07-03 DIAGNOSIS — M6702 Short Achilles tendon (acquired), left ankle: Secondary | ICD-10-CM | POA: Diagnosis not present

## 2024-07-03 DIAGNOSIS — E1165 Type 2 diabetes mellitus with hyperglycemia: Secondary | ICD-10-CM

## 2024-07-03 DIAGNOSIS — S98132A Complete traumatic amputation of one left lesser toe, initial encounter: Secondary | ICD-10-CM

## 2024-07-03 DIAGNOSIS — Z89422 Acquired absence of other left toe(s): Secondary | ICD-10-CM | POA: Diagnosis not present

## 2024-07-03 DIAGNOSIS — Z794 Long term (current) use of insulin: Secondary | ICD-10-CM

## 2024-07-03 DIAGNOSIS — E1142 Type 2 diabetes mellitus with diabetic polyneuropathy: Secondary | ICD-10-CM

## 2024-07-03 DIAGNOSIS — E1159 Type 2 diabetes mellitus with other circulatory complications: Secondary | ICD-10-CM

## 2024-07-03 DIAGNOSIS — I96 Gangrene, not elsewhere classified: Secondary | ICD-10-CM

## 2024-07-03 MED ORDER — LOSARTAN POTASSIUM 25 MG PO TABS: 25.0000 mg | ORAL_TABLET | Freq: Every day | ORAL | 3 refills | Status: AC

## 2024-07-03 MED ORDER — INSULIN PEN NEEDLE 32G X 4 MM MISC: 1.0000 | Freq: Four times a day (QID) | 3 refills | Status: AC

## 2024-07-03 MED ORDER — INSULIN ASPART FLEXPEN 100 UNIT/ML ~~LOC~~ SOPN: 8.0000 [IU] | PEN_INJECTOR | Freq: Three times a day (TID) | SUBCUTANEOUS | 3 refills | Status: AC | PRN

## 2024-07-03 MED ORDER — LANTUS SOLOSTAR 100 UNIT/ML ~~LOC~~ SOPN: 12.0000 [IU] | PEN_INJECTOR | Freq: Every day | SUBCUTANEOUS | 11 refills | Status: AC

## 2024-07-03 NOTE — Telephone Encounter (Signed)
" ° °  Name: [name] [name]  DOB: June 09, 1970  MRN: [id number]  Primary Cardiologist: Jayson Sierras, MD   Preoperative team, please contact this patient and set up a phone call appointment for further preoperative risk assessment. Please obtain consent and complete medication review. Thank you for your help.   I also confirmed the patient resides in the state of [place] . As per [facility] Medical Board telemedicine laws, the patient must reside in the state in which the provider is licensed.   Mardy KATHEE Pizza, FNP 07/03/2024, 12:28 PM [facility]    "

## 2024-07-03 NOTE — Patient Instructions (Signed)
 Continue Lantus  12 units once daily Take Novolog  8 units with each meal OR 4 units with a snack  Novolog  correctional insulin : Use the scale below to help guide you before each meal   Blood sugar before meal Number of units to inject  Less than 180 0 unit  181 - 230 1 units  231 - 280 2 units  281 - 330 3 units  331 - 380 4 units  381 - 430 5 units  431 - 480 6 units  481 - 530 7 units   HOW TO TREAT LOW BLOOD SUGARS (Blood sugar LESS THAN 70 MG/DL) Please follow the RULE OF 15 for the treatment of hypoglycemia treatment (when your (blood sugars are less than 70 mg/dL)   STEP 1: Take 15 grams of carbohydrates when your blood sugar is low, which includes:  3-4 GLUCOSE TABS  OR 3-4 OZ OF JUICE OR REGULAR SODA OR ONE TUBE OF GLUCOSE GEL    STEP 2: RECHECK blood sugar in 15 MINUTES STEP 3: If your blood sugar is still low at the 15 minute recheck --> then, go back to STEP 1 and treat AGAIN with another 15 grams of carbohydrates.

## 2024-07-03 NOTE — Telephone Encounter (Signed)
"  ° °  Pre-operative Risk Assessment    Patient Name: [name] [name]  DOB: 1970/07/29 MRN: [id number]   Date of last office visit: 11/14/23 DR. MCDOWELL Date of next office visit: NONE   Request for Surgical Clearance    Procedure:  RIGHT REVERSE SHOULDER ARTHROPLASTY  Date of Surgery:  Clearance TBD                                Surgeon:  DR. JUDITHANN GLENDIA HUTCHINSON Surgeon's Group or Practice Name:  [facility] Phone number:  [phone] Fax number:  [phone]   Type of Clearance Requested:   - Medical    Type of Anesthesia:  General    Additional requests/questions:    Bonney Niels Jest   07/03/2024, 12:06 PM   "

## 2024-07-06 ENCOUNTER — Ambulatory Visit: Payer: MEDICAID

## 2024-07-06 DIAGNOSIS — Z01810 Encounter for preprocedural cardiovascular examination: Secondary | ICD-10-CM

## 2024-07-06 NOTE — Progress Notes (Signed)
 "   Virtual Visit via Telephone Note   Because of Farzad L Okane co-morbid illnesses, he is at least at moderate risk for complications without adequate follow up.  This format is felt to be most appropriate for this patient at this time.  Due to technical limitations with video connection (technology), today's appointment will be conducted as an audio only telehealth visit, and [name] [name] verbally agreed to proceed in this manner.   All issues noted in this document were discussed and addressed.  No physical exam could be performed with this format.  Evaluation Performed:  Preoperative cardiovascular risk assessment _____________   Date:  07/06/2024   Patient ID:  [name] [name], DOB 1970/12/21, MRN [id number] Patient Location:  Home Provider location:   Office  Primary Care Provider:  [name], PA-C Primary Cardiologist:  Jayson Sierras, MD  Chief Complaint / Patient Profile   54 y.o. y/o male with a h/o nonobstructive coronary artery disease, chronic back pain, hypertension, hyperlipidemia, type 2 diabetes mellitus on insulin , asthma, sleep apnea without CPAP use who is pending right reverse shoulder arthroplasty and presents today for telephonic preoperative cardiovascular risk assessment.  History of Present Illness    [name] [name] is a 54 y.o. male who presents via audio/video conferencing for a telehealth visit today.  Pt was last seen in cardiology clinic on 11/14/2023 by Sierras.  At that time [name] [name] was doing well .  The patient is now pending procedure as outlined above. Since his last visit, he has some trouble with mobility due to having 2 toes amputated last year so he is off balance.  He uses a cane to get around the house.  He does not have stairs in his home.  He does do some household tasks and would be able to go up a flight of stairs if needed.  For this reason he does surpass 4 METS on the DASI.  He sees vascular surgery for his peripheral artery  disease.  He is not on any blood thinners.    Past Medical History    Past Medical History:  Diagnosis Date   Anxiety    Arthritis    Asthma    CAD (coronary artery disease)    a. 03/2015 Cath: LM nl, LAD mild diff dzs throughout w/ 50p/d, LCX diff dzs, 76m, RCA diff dzs, 81m-->Med rx; b. 12/2020 MV: EF 57%, small/mild apical inferior defect w/ partial rev/mild ischemia.   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, bilateral    Sleep apnea 2016   does not use CPAP   Type 2 diabetes mellitus (HCC)    Past Surgical History:  Procedure Laterality Date   ABDOMINAL AORTOGRAM W/LOWER EXTREMITY N/A 06/20/2023   Procedure: ABDOMINAL AORTOGRAM W/LOWER EXTREMITY;  Surgeon: Pearline Norman RAMAN, MD;  Location: [facility];  Service: Cardiovascular;  Laterality: N/A;   AMPUTATION Left 06/24/2023   Procedure: LEFT 5TH TOE AMPUTATION;  Surgeon: Harden Jerona GAILS, MD;  Location: [facility] OR;  Service: Orthopedics;  Laterality: Left;   AMPUTATION Left 07/20/2023  Procedure: LEFT FOOT 4TH AND 5TH RAY AMPUTATION;  Surgeon: Harden Jerona GAILS, MD;  Location: [facility] OR;  Service: Orthopedics;  Laterality: Left;   ANKLE SURGERY Right 1982   had extra bones in there; took them out   APPENDECTOMY  1975   BIOPSY  12/26/2018   Procedure: BIOPSY;  Surgeon: Golda Claudis PENNER, MD;  Location: [facility];  Service: Endoscopy;;  duodenal biopsies   BIOPSY  03/23/2019   Procedure: BIOPSY;  Surgeon: Golda Claudis PENNER, MD;  Location: [facility];  Service: Endoscopy;;  esophagus   CARDIAC CATHETERIZATION N/A 03/28/2015   Procedure: Left Heart Cath and Coronary Angiography;  Surgeon:  Gordy Bergamo, MD;  Location[facility] INV[facility] CV LAB;  Service: Cardiovascular;  Laterality: N/A;   CARDIAC CATHETERIZATION N/A 03/28/2015   Procedure: Intravascular Pressure Wire/FFR Study;  Surgeon: Gordy Bergamo, MD;  Location: [facility] INVASIVE CV LAB;  Service: Cardiovascular;  Laterality: N/A;   CARPAL TUNNEL RELEASE Left ~ 2008   COLONOSCOPY WITH ESOPHAGOGASTRODUODENOSCOPY (EGD)     COLONOSCOPY WITH PROPOFOL  N/A 12/24/2019   Procedure: COLONOSCOPY WITH PROPOFOL ;  Surgeon: Golda Claudis PENNER, MD;  Location: [facility];  Service: Endoscopy;  Laterality: N/A;  955   ELBOW FRACTURE SURGERY Left ~ 2008   ESOPHAGEAL MANOMETRY N/A 09/16/2021   Procedure: ESOPHAGEAL MANOMETRY (EM);  Surgeon: San Sandor GAILS, DO;  Location: [facility];  Service: Endoscopy;  Laterality: N/A;   ESOPHAGOGASTRODUODENOSCOPY N/A 12/26/2018   Procedure: ESOPHAGOGASTRODUODENOSCOPY (EGD);  Surgeon: Golda Claudis PENNER, MD;  Location: [facility];  Service: Endoscopy;  Laterality: N/A;   ESOPHAGOGASTRODUODENOSCOPY (EGD) WITH PROPOFOL  N/A 03/23/2019   Procedure: ESOPHAGOGASTRODUODENOSCOPY (EGD) WITH PROPOFOL ;  Surgeon: Golda Claudis PENNER, MD;  Location: [facility];  Service: Endoscopy;  Laterality: N/A;  7:30   FRACTURE SURGERY     ankle and elbow   PERIPHERAL VASCULAR BALLOON ANGIOPLASTY  06/20/2023   Procedure: PERIPHERAL VASCULAR BALLOON ANGIOPLASTY;  Surgeon: Pearline Norman RAMAN, MD;  Location: [facility];  Service: Cardiovascular;;   PH IMPEDANCE STUDY N/A 09/16/2021   Procedure: PH IMPEDANCE STUDY;  Surgeon: San Sandor GAILS, DO;  Location: [facility];  Service: Endoscopy;  Laterality: N/A;   PILONIDAL CYST EXCISION N/A 03/24/2017   Procedure: EXCISION CHRONIC  PILONIDAL ABSCESS;  Surgeon: Vernetta Berg, MD;  Location: [facility] ORS;  Service: General;  Laterality: N/A;   SCROTAL EXPLORATION N/A 11/13/2020   Procedure: EXCISION OF SEBACEOUS CYSTS, SCROTUM;  Surgeon: Sherrilee Belvie CROME, MD;  Location: [facility];  Service: Urology;   Laterality: N/A;   TENDON REPAIR Left ~ 2004   main tendon in my ankle    Allergies  Allergies[1]  Home Medications    Prior to Admission medications  Medication Sig Start Date End Date Taking? Authorizing Provider  acetaminophen  (TYLENOL ) 500 MG tablet Take 1,000 mg by mouth every 6 (six) hours as needed for headache.    [provider]  amLODipine  (NORVASC ) 10 MG tablet Take 1 tablet (10 mg total) by mouth at bedtime. 02/06/24 07/03/24  Willette Adriana LABOR, MD  atorvastatin  (LIPITOR ) 80 MG tablet Take 1 tablet (80 mg total) by mouth at bedtime. 12/01/23   Debera Jayson MATSU, MD  Continuous Glucose Sensor (FREESTYLE LIBRE 3 SENSOR) MISC by Does not apply route.    [provider]  docusate sodium  (COLACE) 100 MG capsule Take 1 capsule (100 mg total) by mouth every 12 (twelve) hours. 04/08/24   Daralene Lonni BIRCH, PA-C  ezetimibe  (ZETIA ) 10 MG tablet Take 1  tablet (10 mg total) by mouth daily. 12/01/23 07/03/24  Debera Jayson MATSU, MD  HYDROcodone -acetaminophen  (NORCO/VICODIN) 5-325 MG tablet Take 1 tablet by mouth every 12 (twelve) hours as needed for moderate pain (pain score 4-6). 06/11/24   Addie Cordella Hamilton, MD  Insulin  Aspart FlexPen (NOVOLOG ) 100 UNIT/ML Inject 8 Units into the skin 3 (three) times daily as needed (high blood sugar). Sliding scale 07/03/24   Shamleffer, Ibtehal Jaralla, MD  insulin  glargine (LANTUS  SOLOSTAR) 100 UNIT/ML Solostar Pen Inject 12 Units into the skin daily. 07/03/24   Shamleffer, Ibtehal Jaralla, MD  Insulin  Pen Needle 32G X 4 MM MISC 1 Device by Does not apply route in the morning, at noon, in the evening, and at bedtime. 07/03/24   Shamleffer, Ibtehal Jaralla, MD  losartan  (COZAAR ) 25 MG tablet Take 1 tablet (25 mg total) by mouth daily. 07/03/24   Shamleffer, Ibtehal Jaralla, MD  OLANZapine  (ZYPREXA ) 10 MG tablet Take 1 tablet (10 mg total) by mouth at bedtime. 02/23/24   [name], Julie, MD  polyethylene glycol powder (MIRALAX ) 17 GM/SCOOP powder  Take 17 g by mouth daily. Dissolve 1 capful (17g) in 4-8 ounces of liquid and take by mouth daily. 04/08/24   Daralene Lonni BIRCH, PA-C  sertraline  (ZOLOFT ) 100 MG tablet Take 2 tablets (200 mg total) by mouth at bedtime. 08/30/22   Christobal Guadalajara, MD    Physical Exam    Vital Signs:  [name] [name] does not have vital signs available for review today.  Given telephonic nature of communication, physical exam is limited. AAOx3. NAD. Normal affect.  Speech and respirations are unlabored.  Accessory Clinical Findings    None  Assessment & Plan    1.  Preoperative Cardiovascular Risk Assessment:  [name] [name] perioperative risk of a major cardiac event is 0.9% according to the Revised Cardiac Risk Index (RCRI).  Therefore, he is at low risk for perioperative complications.   His functional capacity is fair at 4.73 METs according to the [employer] Activity Status Index (DASI). Recommendations: According to ACC/AHA guidelines, no further cardiovascular testing needed.  The patient may proceed to surgery at acceptable risk.     The patient was advised that if he develops new symptoms prior to surgery to contact our office to arrange for a follow-up visit, and he verbalized understanding.  A copy of this note will be routed to requesting surgeon.  Time:   Today, I have spent 6 minutes with the patient with telehealth technology discussing medical history, symptoms, and management plan.     [name] LOISE Fabry, PA-C  07/06/2024, 9:14 AM     [1]  Allergies Allergen Reactions   Bee Venom Swelling   Morphine  Itching and Nausea And Vomiting    IV site   Nexium [Esomeprazole] Swelling    Face swells, no breathing impairment   Prilosec Otc [Omeprazole Magnesium ] Swelling    Face swells, no breathing impairment   "

## 2024-07-31 ENCOUNTER — Ambulatory Visit (HOSPITAL_COMMUNITY): Admit: 2024-07-31 | Payer: MEDICAID | Admitting: Orthopedic Surgery

## 2024-08-15 ENCOUNTER — Encounter: Payer: MEDICAID | Admitting: Orthopedic Surgery

## 2024-10-02 ENCOUNTER — Ambulatory Visit: Payer: MEDICAID | Admitting: Orthopedic Surgery

## 2024-12-31 ENCOUNTER — Ambulatory Visit: Payer: MEDICAID | Admitting: Internal Medicine
